# Patient Record
Sex: Female | Born: 1960
Health system: Southern US, Community
[De-identification: ages and names within clinical notes are randomized; demographics above are authoritative.]

## PROBLEM LIST (undated history)

## (undated) DIAGNOSIS — N3946 Mixed incontinence: Secondary | ICD-10-CM

## (undated) DIAGNOSIS — G47419 Narcolepsy without cataplexy: Secondary | ICD-10-CM

## (undated) DIAGNOSIS — J9611 Chronic respiratory failure with hypoxia: Secondary | ICD-10-CM

## (undated) DIAGNOSIS — M545 Low back pain, unspecified: Secondary | ICD-10-CM

## (undated) DIAGNOSIS — I10 Essential (primary) hypertension: Secondary | ICD-10-CM

## (undated) DIAGNOSIS — J449 Chronic obstructive pulmonary disease, unspecified: Secondary | ICD-10-CM

## (undated) DIAGNOSIS — K0530 Chronic periodontitis, unspecified: Secondary | ICD-10-CM

## (undated) DIAGNOSIS — E669 Obesity, unspecified: Secondary | ICD-10-CM

## (undated) DIAGNOSIS — N139 Obstructive and reflux uropathy, unspecified: Secondary | ICD-10-CM

## (undated) DIAGNOSIS — M419 Scoliosis, unspecified: Secondary | ICD-10-CM

## (undated) DIAGNOSIS — Z87898 Personal history of other specified conditions: Secondary | ICD-10-CM

## (undated) DIAGNOSIS — R011 Cardiac murmur, unspecified: Secondary | ICD-10-CM

## (undated) DIAGNOSIS — M51379 Other intervertebral disc degeneration, lumbosacral region without mention of lumbar back pain or lower extremity pain: Secondary | ICD-10-CM

## (undated) DIAGNOSIS — Z87440 Personal history of urinary (tract) infections: Secondary | ICD-10-CM

## (undated) DIAGNOSIS — G8929 Other chronic pain: Secondary | ICD-10-CM

## (undated) DIAGNOSIS — K219 Gastro-esophageal reflux disease without esophagitis: Secondary | ICD-10-CM

## (undated) DIAGNOSIS — J410 Simple chronic bronchitis: Secondary | ICD-10-CM

## (undated) DIAGNOSIS — R0902 Hypoxemia: Secondary | ICD-10-CM

## (undated) DIAGNOSIS — C799 Secondary malignant neoplasm of unspecified site: Secondary | ICD-10-CM

## (undated) DIAGNOSIS — M5137 Other intervertebral disc degeneration, lumbosacral region: Secondary | ICD-10-CM

## (undated) DIAGNOSIS — F329 Major depressive disorder, single episode, unspecified: Secondary | ICD-10-CM

## (undated) DIAGNOSIS — K029 Dental caries, unspecified: Secondary | ICD-10-CM

## (undated) DIAGNOSIS — G4733 Obstructive sleep apnea (adult) (pediatric): Secondary | ICD-10-CM

## (undated) DIAGNOSIS — Z85828 Personal history of other malignant neoplasm of skin: Secondary | ICD-10-CM

## (undated) DIAGNOSIS — G4730 Sleep apnea, unspecified: Secondary | ICD-10-CM

## (undated) DIAGNOSIS — D689 Coagulation defect, unspecified: Secondary | ICD-10-CM

## (undated) DIAGNOSIS — M797 Fibromyalgia: Secondary | ICD-10-CM

## (undated) DIAGNOSIS — D471 Chronic myeloproliferative disease: Secondary | ICD-10-CM

## (undated) DIAGNOSIS — R918 Other nonspecific abnormal finding of lung field: Secondary | ICD-10-CM

## (undated) DIAGNOSIS — Z973 Presence of spectacles and contact lenses: Secondary | ICD-10-CM

## (undated) DIAGNOSIS — K0889 Other specified disorders of teeth and supporting structures: Secondary | ICD-10-CM

## (undated) DIAGNOSIS — G629 Polyneuropathy, unspecified: Secondary | ICD-10-CM

## (undated) DIAGNOSIS — Z9889 Other specified postprocedural states: Secondary | ICD-10-CM

## (undated) DIAGNOSIS — D45 Polycythemia vera: Secondary | ICD-10-CM

## (undated) DIAGNOSIS — Z9981 Dependence on supplemental oxygen: Secondary | ICD-10-CM

## (undated) DIAGNOSIS — K635 Polyp of colon: Secondary | ICD-10-CM

## (undated) DIAGNOSIS — F419 Anxiety disorder, unspecified: Secondary | ICD-10-CM

## (undated) DIAGNOSIS — F410 Panic disorder [episodic paroxysmal anxiety] without agoraphobia: Secondary | ICD-10-CM

## (undated) DIAGNOSIS — M1990 Unspecified osteoarthritis, unspecified site: Secondary | ICD-10-CM

## (undated) DIAGNOSIS — F4000 Agoraphobia, unspecified: Secondary | ICD-10-CM

## (undated) DIAGNOSIS — E119 Type 2 diabetes mellitus without complications: Secondary | ICD-10-CM

## (undated) DIAGNOSIS — E785 Hyperlipidemia, unspecified: Secondary | ICD-10-CM

## (undated) HISTORY — DX: Polyneuropathy, unspecified: G62.9

## (undated) HISTORY — DX: Anxiety disorder, unspecified: F41.9

## (undated) HISTORY — DX: Obesity, unspecified: E66.9

## (undated) HISTORY — DX: Cardiac murmur, unspecified: R01.1

## (undated) HISTORY — DX: Scoliosis, unspecified: M41.9

## (undated) HISTORY — DX: Agoraphobia, unspecified: F40.00

## (undated) HISTORY — DX: Polycythemia vera: D45

## (undated) HISTORY — DX: Major depressive disorder, single episode, unspecified: F32.9

## (undated) HISTORY — DX: Fibromyalgia: M79.7

## (undated) HISTORY — DX: Polyp of colon: K63.5

## (undated) HISTORY — DX: Obstructive and reflux uropathy, unspecified: N13.9

## (undated) HISTORY — DX: Coagulation defect, unspecified: D68.9

## (undated) HISTORY — DX: Personal history of urinary (tract) infections: Z87.440

## (undated) HISTORY — DX: Hypoxemia: R09.02

## (undated) HISTORY — PX: DILATION AND CURETTAGE OF UTERUS: SHX78

## (undated) HISTORY — DX: Narcolepsy without cataplexy: G47.419

## (undated) HISTORY — DX: Essential (primary) hypertension: I10

## (undated) HISTORY — PX: COLONOSCOPY W/ BIOPSIES AND POLYPECTOMY: SHX1376

## (undated) HISTORY — DX: Secondary malignant neoplasm of unspecified site: C79.9

## (undated) HISTORY — DX: Hyperlipidemia, unspecified: E78.5

## (undated) HISTORY — DX: Panic disorder (episodic paroxysmal anxiety): F41.0

---

## 1993-06-29 HISTORY — PX: BREAST LUMPECTOMY: SHX2

## 1996-06-29 HISTORY — PX: ABDOMINAL HYSTERECTOMY: SHX81

## 1998-06-29 HISTORY — PX: CYSTECTOMY: SUR359

## 2011-06-30 HISTORY — PX: MOHS SURGERY: SUR867

## 2011-12-10 ENCOUNTER — Other Ambulatory Visit: Payer: Self-pay | Admitting: Family Medicine

## 2011-12-10 DIAGNOSIS — Z1231 Encounter for screening mammogram for malignant neoplasm of breast: Secondary | ICD-10-CM

## 2011-12-10 DIAGNOSIS — Z780 Asymptomatic menopausal state: Secondary | ICD-10-CM

## 2011-12-30 ENCOUNTER — Ambulatory Visit: Payer: Self-pay

## 2011-12-30 ENCOUNTER — Other Ambulatory Visit: Payer: Self-pay

## 2012-02-16 ENCOUNTER — Ambulatory Visit (INDEPENDENT_AMBULATORY_CARE_PROVIDER_SITE_OTHER): Payer: Medicare PPO | Admitting: Orthopedic Surgery

## 2012-02-16 ENCOUNTER — Encounter: Payer: Self-pay | Admitting: Orthopedic Surgery

## 2012-02-16 VITALS — BP 140/82 | Ht 68.0 in | Wt 246.0 lb

## 2012-02-16 DIAGNOSIS — M5137 Other intervertebral disc degeneration, lumbosacral region: Secondary | ICD-10-CM

## 2012-02-16 DIAGNOSIS — F3289 Other specified depressive episodes: Secondary | ICD-10-CM

## 2012-02-16 DIAGNOSIS — M797 Fibromyalgia: Secondary | ICD-10-CM | POA: Insufficient documentation

## 2012-02-16 DIAGNOSIS — IMO0001 Reserved for inherently not codable concepts without codable children: Secondary | ICD-10-CM

## 2012-02-16 DIAGNOSIS — G894 Chronic pain syndrome: Secondary | ICD-10-CM

## 2012-02-16 DIAGNOSIS — F329 Major depressive disorder, single episode, unspecified: Secondary | ICD-10-CM

## 2012-02-16 DIAGNOSIS — M5030 Other cervical disc degeneration, unspecified cervical region: Secondary | ICD-10-CM

## 2012-02-16 DIAGNOSIS — M5136 Other intervertebral disc degeneration, lumbar region: Secondary | ICD-10-CM | POA: Insufficient documentation

## 2012-02-16 DIAGNOSIS — M51379 Other intervertebral disc degeneration, lumbosacral region without mention of lumbar back pain or lower extremity pain: Secondary | ICD-10-CM

## 2012-02-16 DIAGNOSIS — F32A Depression, unspecified: Secondary | ICD-10-CM | POA: Insufficient documentation

## 2012-02-16 NOTE — Progress Notes (Signed)
Subjective:    Katelyn Cline is a 51 y.o. female referred to me from [facility] Dr. Maryelizabeth Rowan. The patient has a chief complaint of neck back and right-sided hip pain which she's had for 15-35 years starting with her back 35 years ago secondary to scoliosis. Her neck and hip have been hurting her for the last 15 years. She moved from [place] where she was receiving treatment for fibromyalgia with physical therapy and she was on Lortab and Flexeril at that time. She has not had Lortab for the last 6 months.  She is currently disabled secondary to various medical problems including depression, chronic pain. There she is currently complaining of sharp dull stabbing pain which she rates between 8 and 10 previously relieved by Lortab down to a 2 or 3. Her hip pain is constant her back pain is intermittent. The neck and mid thoracic pain started in the morning, the lower back spasms occur intermittently. Her right hip pain is in her right gluteal area. Her pain is improved by reclining and with the medication. She has difficulty with sleeping with increased pain as well as increased pain standing walking and driving. She has occasional numbness and tingling into the right lower extremity.  Specifically her neck is pain full in the posterior region associated with stiffness and difficulty turning. She has increased pain with driving. She feels crepitation when rotating her head and neck. She has difficulty holding her head up this is relieved by extension. She will have occasional numbness and tingling in her hands and arms sometimes not at all but sometimes up to 2-3 times a week and she reports weakness in her upper extremities  Specifically her back pain is in 2 different areas primarily in the lower part which is worse with standing. She cannot stand long enough to make a sandwich or do any real activities of daily living. She has difficulty even taking a bath or shower. She will have  spasms excuse her pain up to a 10 otherwise she has pain 2-3/10.  Specific to her right hip which is her right buttock area the pain were run down to the knee and occasionally below. But she is favoring the right side she will have pain across the left side of her gluteal area. This is recently required the use of a cane.    She has weakness in the right leg, tingling in the right leg, burning pain in the right leg and Denies difficulty urinating painful urinating fine pain or hematuria associated with the back pain. The patient has no "red flag" history indicative of complicated back pain.  The following portions of the patient's history were reviewed and updated as appropriate: allergies, current medications, past family history, past medical history, past social history, past surgical history and problem list.  Review of Systems Positive findings include fatigue, blurred vision and watering of the eyes. Chest pain and chest palpitations. Snoring. Heartburn nausea constipation diarrhea. Frequency urgency joint pain. Stiffness. Muscle pain. Poor healing and itching in the skin. Numbness tingling unsteady gait and dizziness. Nervousness, anxiety, depression. Easy bleeding. Temperature intolerance. Denies adverse food reaction or seasonal allergies. Denies fever chills weight loss.    Objective:     BP 140/82  Ht 5\' 8"  (1.727 m)  Wt 246 lb (111.585 kg)  BMI 37.40 kg/m2 The patient required and requested that her Rottweiler dog be with her for her exam General appearance: The patient's overall appearance was within normal limits. Her body  habitus is mesomorphic.  She was oriented x3. Her mood and affect were normal. She ambulated with a cane and had normal standing posture.  Cervical spine flexion and extension were normal she had better rotation to the right than the left. She had tenderness at C6 and C7 as well as C5 C4 interspace. No atrophy. Skin normal. No instability. No  lymphadenopathy  Right lower extremity flexion normal no tenderness no instability normal strength normal skin, no lymphadenopathy. No detectable injury loss. Coordination and balance normal  Left lower extremity flexion was normal, no instability no tenderness normal strength normal skin Coordination and balance normal no lymphadenopathy Internal and external rotation and hip flexion causes pain in the patient's lower back without radicular symptoms.  Deep tendon reflexes at the knee ankle elbow and wrist were normal  Distally both lower extremities and upper extremities and normal pulse and temperature without swelling.   Imaging her son and this concluded 5 views of the cervical spine and 3 views of the lumbar spine. She has L5-S1 disc space narrowing. She is coronal plane malalignment in the lumbar spine.  The cervical spine has disc space disease at C4 and 5 with loss of normal cervical lordosis.  Assessment:    Our conclusions indicate the following the patient has osteoarthritis of the cervical spine at C4 to 5. She also has degenerative disc disease at L5 and S1. There is fibromyalgia syndrome. Chronic pain.    Plan:    Our recommendations are for a physical therapy program to include swimming. She will need chronic pain management for combination therapy which should include but not be limited to anti-inflammatories, analgesics, antidepressants.     Unfortunately, I cannot take this patient on as an ongoing patient. She has chronic pain in knees chronic pain management. No surgery is needed. He have any other treatments to offer her and this is strictly a second opinion  Regarding the patient's disability. Individually her lumbar arthritis and her cervical arthritis do not cause any orthopedic disability however in the setting of depression, fibromyalgia, these entities can certainly be focused on as a cause of dysfunction. I cannot comment on the patient's overall disability  which seemed to be more related to her psychological issues.

## 2012-03-14 ENCOUNTER — Encounter (HOSPITAL_COMMUNITY): Payer: Self-pay | Admitting: Psychiatry

## 2012-03-14 ENCOUNTER — Ambulatory Visit (INDEPENDENT_AMBULATORY_CARE_PROVIDER_SITE_OTHER): Payer: Medicare PPO | Admitting: Psychiatry

## 2012-03-14 ENCOUNTER — Other Ambulatory Visit (HOSPITAL_COMMUNITY): Payer: Self-pay | Admitting: *Deleted

## 2012-03-14 VITALS — BP 127/83 | HR 71 | Wt 247.8 lb

## 2012-03-14 DIAGNOSIS — F0630 Mood disorder due to known physiological condition, unspecified: Secondary | ICD-10-CM

## 2012-03-14 DIAGNOSIS — F3289 Other specified depressive episodes: Secondary | ICD-10-CM

## 2012-03-14 DIAGNOSIS — F329 Major depressive disorder, single episode, unspecified: Secondary | ICD-10-CM

## 2012-03-14 MED ORDER — BUPROPION HCL ER (SR) 100 MG PO TB12: 100.0000 mg | ORAL_TABLET | Freq: Every morning | ORAL | Status: DC
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
## 2012-03-30 ENCOUNTER — Encounter (HOSPITAL_COMMUNITY): Payer: Self-pay | Admitting: Psychiatry

## 2012-03-30 ENCOUNTER — Ambulatory Visit (INDEPENDENT_AMBULATORY_CARE_PROVIDER_SITE_OTHER): Payer: Medicare PPO | Admitting: Psychiatry

## 2012-03-30 DIAGNOSIS — F329 Major depressive disorder, single episode, unspecified: Secondary | ICD-10-CM

## 2012-03-30 DIAGNOSIS — F3289 Other specified depressive episodes: Secondary | ICD-10-CM

## 2012-03-30 DIAGNOSIS — F39 Unspecified mood [affective] disorder: Secondary | ICD-10-CM

## 2012-03-30 MED ORDER — BUPROPION HCL ER (SR) 150 MG PO TB12: 150.0000 mg | ORAL_TABLET | Freq: Every morning | ORAL | Status: DC
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
## 2012-04-06 ENCOUNTER — Ambulatory Visit (HOSPITAL_COMMUNITY): Payer: Medicare PPO | Admitting: Licensed Clinical Social Worker

## 2012-04-08 ENCOUNTER — Encounter: Payer: Self-pay | Admitting: Family Medicine

## 2012-04-08 ENCOUNTER — Ambulatory Visit (INDEPENDENT_AMBULATORY_CARE_PROVIDER_SITE_OTHER): Payer: Medicare PPO | Admitting: Family Medicine

## 2012-04-08 VITALS — BP 152/84 | HR 64 | Resp 18 | Ht 67.5 in | Wt 250.0 lb

## 2012-04-08 DIAGNOSIS — M51369 Other intervertebral disc degeneration, lumbar region without mention of lumbar back pain or lower extremity pain: Secondary | ICD-10-CM

## 2012-04-08 DIAGNOSIS — G609 Hereditary and idiopathic neuropathy, unspecified: Secondary | ICD-10-CM

## 2012-04-08 DIAGNOSIS — E785 Hyperlipidemia, unspecified: Secondary | ICD-10-CM

## 2012-04-08 DIAGNOSIS — M5136 Other intervertebral disc degeneration, lumbar region: Secondary | ICD-10-CM

## 2012-04-08 DIAGNOSIS — G629 Polyneuropathy, unspecified: Secondary | ICD-10-CM

## 2012-04-08 DIAGNOSIS — F329 Major depressive disorder, single episode, unspecified: Secondary | ICD-10-CM

## 2012-04-08 DIAGNOSIS — G47419 Narcolepsy without cataplexy: Secondary | ICD-10-CM

## 2012-04-08 DIAGNOSIS — F17200 Nicotine dependence, unspecified, uncomplicated: Secondary | ICD-10-CM

## 2012-04-08 DIAGNOSIS — E669 Obesity, unspecified: Secondary | ICD-10-CM

## 2012-04-08 DIAGNOSIS — J3489 Other specified disorders of nose and nasal sinuses: Secondary | ICD-10-CM

## 2012-04-08 DIAGNOSIS — L989 Disorder of the skin and subcutaneous tissue, unspecified: Secondary | ICD-10-CM

## 2012-04-08 DIAGNOSIS — M797 Fibromyalgia: Secondary | ICD-10-CM

## 2012-04-08 DIAGNOSIS — G894 Chronic pain syndrome: Secondary | ICD-10-CM

## 2012-04-08 DIAGNOSIS — M5137 Other intervertebral disc degeneration, lumbosacral region: Secondary | ICD-10-CM

## 2012-04-08 DIAGNOSIS — Z720 Tobacco use: Secondary | ICD-10-CM

## 2012-04-08 DIAGNOSIS — F3289 Other specified depressive episodes: Secondary | ICD-10-CM

## 2012-04-08 DIAGNOSIS — IMO0001 Reserved for inherently not codable concepts without codable children: Secondary | ICD-10-CM

## 2012-04-08 DIAGNOSIS — F32A Depression, unspecified: Secondary | ICD-10-CM

## 2012-04-08 DIAGNOSIS — I10 Essential (primary) hypertension: Secondary | ICD-10-CM

## 2012-04-08 MED ORDER — TRAMADOL HCL 50 MG PO TABS: 50.0000 mg | ORAL_TABLET | Freq: Four times a day (QID) | ORAL | Status: DC | PRN

## 2012-04-08 NOTE — Patient Instructions (Addendum)
I will records from Dr. Duanne Guess and Dr. Jory Ee  Use the arthritis medication Tramadol  I recommend you quit smoking! Referral to Dermatology  F/U 2 months - morning appointment, come fasting

## 2012-04-09 ENCOUNTER — Encounter: Payer: Self-pay | Admitting: Family Medicine

## 2012-04-09 DIAGNOSIS — G47419 Narcolepsy without cataplexy: Secondary | ICD-10-CM | POA: Insufficient documentation

## 2012-04-09 DIAGNOSIS — E669 Obesity, unspecified: Secondary | ICD-10-CM | POA: Insufficient documentation

## 2012-04-09 DIAGNOSIS — I10 Essential (primary) hypertension: Secondary | ICD-10-CM | POA: Insufficient documentation

## 2012-04-09 DIAGNOSIS — F17200 Nicotine dependence, unspecified, uncomplicated: Secondary | ICD-10-CM | POA: Insufficient documentation

## 2012-04-09 DIAGNOSIS — G629 Polyneuropathy, unspecified: Secondary | ICD-10-CM | POA: Insufficient documentation

## 2012-04-09 DIAGNOSIS — E785 Hyperlipidemia, unspecified: Secondary | ICD-10-CM | POA: Insufficient documentation

## 2012-04-09 DIAGNOSIS — L989 Disorder of the skin and subcutaneous tissue, unspecified: Secondary | ICD-10-CM | POA: Insufficient documentation

## 2012-04-09 NOTE — Assessment & Plan Note (Signed)
Per above, ultram

## 2012-04-09 NOTE — Assessment & Plan Note (Signed)
reviewed ortho note, start ultram, I do not plan to start narcotics on pt

## 2012-04-09 NOTE — Assessment & Plan Note (Signed)
provigil

## 2012-04-09 NOTE — Assessment & Plan Note (Signed)
No current medications

## 2012-04-09 NOTE — Progress Notes (Signed)
  Subjective:    Patient ID: Katelyn Cline, female    DOB: May 23, 1961, 51 y.o.   MRN: [id number]  HPI Pt here to establish care previous PCP Dr. Maryelizabeth Rowan, prior to Practice in [place] Psychiatry- Dr. Lolly Mustache Medications and history reviewed She has extensive mental illness with depression, anxiety and panic attacks, she is being followed by psychiatry. Her disability status is due to her mental illness and she has a service dog because of this. She also suffers with Chronic pain / fibromyalgia in hips, legs, neck, shoulders- she was previously on lortab for 15 years but when she moved to [place] this was not continued. She was evaluated by orthopedics whose recommendations were that her pain was out of proportion to her bone and joint diease and that her mental health was contributing. She lives here with her son. She has a lesion on her nose for the past few years that has been growing in size and eroding into her skin, has not seen dermatology Due for Mammogram Previous colonoscopy- she has seen [facility]   Review of Systems - per above   GEN- + fatigue, fever, weight loss,weakness, recent illness HEENT- denies eye drainage, change in vision, nasal discharge, CVS- denies chest pain, palpitations RESP- denies SOB, cough, wheeze ABD- denies N/V, change in stools, abd pain GU- denies dysuria, hematuria, dribbling, incontinence MSK- + joint pain,+ muscle aches, injury Neuro- denies headache, dizziness, syncope, seizure activity      Objective:   Physical Exam GEN- NAD, alert and oriented x3, walks with cane HEENT- PERRL, EOMI, non injected sclera, pink conjunctiva, MMM, oropharynx clear Neck- Supple,  CVS- RRR, no murmur RESP-CTAB EXT- pedal edema, feet very dirty, no open lesions Pulses- Radial, DP- 2+ Skin- scabbed lesion with mild erythema eroding into skin on left side nares, no bleeding noted        Assessment & Plan:

## 2012-04-09 NOTE — Assessment & Plan Note (Signed)
Continue cymbalta, she has some coverage problems with her insurance, we may have to try gabapentin as she has neuropathy as well

## 2012-04-09 NOTE — Assessment & Plan Note (Signed)
Continue lipitor and fish oil

## 2012-04-09 NOTE — Assessment & Plan Note (Signed)
Refer to dermatology 

## 2012-04-09 NOTE — Assessment & Plan Note (Signed)
Followed by psychiatry 

## 2012-04-14 ENCOUNTER — Telehealth: Payer: Self-pay | Admitting: Family Medicine

## 2012-04-14 ENCOUNTER — Other Ambulatory Visit (HOSPITAL_COMMUNITY): Payer: Self-pay | Admitting: Psychiatry

## 2012-04-14 NOTE — Telephone Encounter (Signed)
Please advise 

## 2012-04-14 NOTE — Telephone Encounter (Signed)
She can try 1 -2 tablet, I will not prescribe any further meds until records are received

## 2012-04-19 NOTE — Telephone Encounter (Signed)
Patient aware.

## 2012-04-25 ENCOUNTER — Ambulatory Visit (INDEPENDENT_AMBULATORY_CARE_PROVIDER_SITE_OTHER): Payer: Medicare PPO | Admitting: Psychiatry

## 2012-05-04 ENCOUNTER — Telehealth: Payer: Self-pay

## 2012-05-04 ENCOUNTER — Ambulatory Visit (INDEPENDENT_AMBULATORY_CARE_PROVIDER_SITE_OTHER): Payer: Medicare PPO | Admitting: Psychology

## 2012-05-04 ENCOUNTER — Other Ambulatory Visit: Payer: Self-pay

## 2012-05-04 ENCOUNTER — Encounter (HOSPITAL_COMMUNITY): Payer: Self-pay | Admitting: Psychology

## 2012-05-04 ENCOUNTER — Other Ambulatory Visit (HOSPITAL_COMMUNITY): Payer: Self-pay | Admitting: Psychiatry

## 2012-05-04 DIAGNOSIS — F3289 Other specified depressive episodes: Secondary | ICD-10-CM

## 2012-05-04 DIAGNOSIS — F329 Major depressive disorder, single episode, unspecified: Secondary | ICD-10-CM

## 2012-05-04 MED ORDER — IBUPROFEN 800 MG PO TABS: 800.0000 mg | ORAL_TABLET | Freq: Three times a day (TID) | ORAL | Status: DC | PRN

## 2012-05-04 NOTE — Telephone Encounter (Signed)
Med ordered

## 2012-05-04 NOTE — Telephone Encounter (Signed)
YOu can send ibuprofen 800mg , 1 po tID, #60  R 2. She was given ultram with 1 refill, she can take 1-2 tablets
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
## 2012-05-05 ENCOUNTER — Other Ambulatory Visit (HOSPITAL_COMMUNITY): Payer: Self-pay | Admitting: Psychiatry

## 2012-05-05 DIAGNOSIS — F329 Major depressive disorder, single episode, unspecified: Secondary | ICD-10-CM

## 2012-05-05 DIAGNOSIS — F3289 Other specified depressive episodes: Secondary | ICD-10-CM

## 2012-05-05 MED ORDER — BUPROPION HCL ER (SR) 150 MG PO TB12: 150.0000 mg | ORAL_TABLET | Freq: Every morning | ORAL | Status: DC

## 2012-05-13 ENCOUNTER — Telehealth: Payer: Self-pay | Admitting: Family Medicine

## 2012-05-13 NOTE — Telephone Encounter (Signed)
This is filled by her psychiatrist.

## 2012-05-13 NOTE — Telephone Encounter (Signed)
Is this med prescribed by you?

## 2012-05-17 NOTE — Telephone Encounter (Signed)
Patient aware.

## 2012-05-18 ENCOUNTER — Ambulatory Visit (INDEPENDENT_AMBULATORY_CARE_PROVIDER_SITE_OTHER): Payer: Medicare PPO | Admitting: Psychology

## 2012-05-18 DIAGNOSIS — F329 Major depressive disorder, single episode, unspecified: Secondary | ICD-10-CM

## 2012-05-18 DIAGNOSIS — F3289 Other specified depressive episodes: Secondary | ICD-10-CM

## 2012-05-19 ENCOUNTER — Encounter (HOSPITAL_COMMUNITY): Payer: Self-pay | Admitting: Psychiatry

## 2012-05-19 ENCOUNTER — Ambulatory Visit (INDEPENDENT_AMBULATORY_CARE_PROVIDER_SITE_OTHER): Payer: Medicare PPO | Admitting: Psychiatry

## 2012-05-19 VITALS — Ht 67.75 in | Wt 249.8 lb

## 2012-05-19 DIAGNOSIS — Z720 Tobacco use: Secondary | ICD-10-CM

## 2012-05-19 DIAGNOSIS — E785 Hyperlipidemia, unspecified: Secondary | ICD-10-CM

## 2012-05-19 DIAGNOSIS — F0630 Mood disorder due to known physiological condition, unspecified: Secondary | ICD-10-CM

## 2012-05-19 DIAGNOSIS — M797 Fibromyalgia: Secondary | ICD-10-CM

## 2012-05-19 DIAGNOSIS — F329 Major depressive disorder, single episode, unspecified: Secondary | ICD-10-CM

## 2012-05-19 DIAGNOSIS — F3289 Other specified depressive episodes: Secondary | ICD-10-CM

## 2012-05-19 MED ORDER — DULOXETINE HCL 60 MG PO CPEP: 60.0000 mg | ORAL_CAPSULE | Freq: Every day | ORAL | Status: DC

## 2012-05-19 MED ORDER — BUPROPION HCL ER (SR) 150 MG PO TB12
150.0000 mg | ORAL_TABLET | Freq: Every morning | ORAL | Status: DC
Start: 2012-05-19 — End: 2012-08-15

## 2012-05-19 MED ORDER — AMITRIPTYLINE HCL 25 MG PO TABS: 25.0000 mg | ORAL_TABLET | Freq: Every day | ORAL | Status: DC

## 2012-05-19 MED ORDER — GABAPENTIN 100 MG PO CAPS: ORAL_CAPSULE | ORAL | Status: DC
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
## 2012-05-19 NOTE — Patient Instructions (Signed)
Fibromyalgia can be controled well with a combination of Cymbalta with Neurontin with Elavil.  Elavil at night may help with getting to sleep, pain, anxiety, and depression.  It also amplifies the effect of antiinflammatories.

## 2012-05-20 ENCOUNTER — Encounter (HOSPITAL_COMMUNITY): Payer: Self-pay | Admitting: Psychology

## 2012-05-20 NOTE — Progress Notes (Signed)
Patient:  Katelyn Cline   DOB: July 26, 1960  MR Number: [id number]  Location: BEHAVIORAL [facility] [address] [place] [address] Dept: [phone]  Start: 1 PM End: 2 PM  Provider/Observer:     Hershal Coria PSYD  Chief Complaint:      Chief Complaint  Patient presents with  . Depression    Reason For Service:     The patient was referred by Dr. Lolly Mustache for psychotherapeutic interventions. Her background history can be found in both his previous notes as well as her primary care physician's notes. The patient reports that she has numerous orthopedic problems including severe spinal arthritis and degenerative disc issues as well as fibromyalgia and orthopedic issues with her niece just to name a few. The patient has a lifelong history of depression but over the past 5 years she has been experiencing more panic attacks and anxiety. The patient reports that she started these anxiety and panic symptoms do to her last job which was extremely stressful and she feels like some of her coworkers at that time were doing things to try to cause her great distress. She is on Social Security disability do to depression and anxiety night and counting orthopedic issues. The patient reports that when she was at her last job she did experience some passive suicidal ideation but never developed specific impulses or any gestures. She denies suicidal ideation now. The patient denies any family history of bipolar disorder but does have a mother who suffered from depression. She is coming here to see me for psychotherapeutic interventions to build coping skills and strategies for her recurrent issues of depression and pain management issues.   Interventions Strategy:  Cognitive/behavioral psychotherapeutic interventions  Participation Level:   Active  Participation Quality:  Appropriate      Behavioral Observation:  Well  Groomed, Alert, and Appropriate.   Current Psychosocial Factors: The patient reports that she is continuing to have significant pain and orthopedic issues and her depression continues to be problematic for her.  Content of Session:   Reviewed current symptoms and continued work on therapeutic interventions for recurrent symptoms of depression, chronic pain issues, and history of anxiety and panic.  Current Status:   The patient reports her symptoms have been better and that she's been actively working on therapeutic interventions we have develop.  Patient Progress:   Good  Target Goals:   Target goals include reducing the intensity, frequency, and duration of depression symptoms including feelings of helplessness and hopelessness, anhedonia, and social isolation. Significant pain issues, fibromyalgia type symptoms, and anxiety and panic are all present:.  Last Reviewed:   05/18/2012  Goals Addressed Today:    Today we worked on the cognitive and behavioral types of interventions for issues of depression and address issues of her cognitive interpretation of her situation.  Impression/Diagnosis:   The patient has a long history of depression going back most of her life but more recently she has developed significant panic attacks and anxiety symptoms that started approximately 5 years ago. She has numerous orthopedic and medical issues and is not in good physical health at all. Degenerative disc and possible nerve root impingement issues in both her neck and back are present as well as significant orthopedic issues with her legs. She has been diagnosed with fibromyalgia and other pain symptoms as well.   Diagnosis:    Axis I:  1. Depressive disorder, not elsewhere classified  Axis II: No diagnosis

## 2012-06-06 ENCOUNTER — Ambulatory Visit (HOSPITAL_COMMUNITY): Payer: Self-pay | Admitting: Psychology

## 2012-06-09 ENCOUNTER — Other Ambulatory Visit: Payer: Self-pay | Admitting: Family Medicine

## 2012-06-09 ENCOUNTER — Ambulatory Visit (INDEPENDENT_AMBULATORY_CARE_PROVIDER_SITE_OTHER): Payer: Medicare PPO | Admitting: Family Medicine

## 2012-06-09 ENCOUNTER — Encounter: Payer: Self-pay | Admitting: Family Medicine

## 2012-06-09 VITALS — BP 158/92 | HR 86 | Resp 16 | Ht 67.5 in | Wt 254.0 lb

## 2012-06-09 DIAGNOSIS — G894 Chronic pain syndrome: Secondary | ICD-10-CM

## 2012-06-09 DIAGNOSIS — D582 Other hemoglobinopathies: Secondary | ICD-10-CM

## 2012-06-09 DIAGNOSIS — I10 Essential (primary) hypertension: Secondary | ICD-10-CM

## 2012-06-09 DIAGNOSIS — E669 Obesity, unspecified: Secondary | ICD-10-CM

## 2012-06-09 DIAGNOSIS — E785 Hyperlipidemia, unspecified: Secondary | ICD-10-CM

## 2012-06-09 LAB — LIPID PANEL: HDL: 34 mg/dL — ABNORMAL LOW (ref >39)

## 2012-06-09 LAB — COMPREHENSIVE METABOLIC PANEL
Albumin: 4.3 g/dL (ref 3.5–5.2)
CO2: 20 mEq/L (ref 19–32)
Glucose, Bld: 130 mg/dL — ABNORMAL HIGH (ref 70–99)
Potassium: 5.2 mEq/L (ref 3.5–5.3)
Sodium: 140 mEq/L (ref 135–145)
Total Protein: 7.3 g/dL (ref 6.0–8.3)

## 2012-06-09 LAB — CBC
Platelets: 268 10*3/uL (ref 150–400)
RBC: 5.75 MIL/uL — ABNORMAL HIGH (ref 3.87–5.11)
WBC: 12.6 10*3/uL — ABNORMAL HIGH (ref 4.0–10.5)

## 2012-06-09 LAB — TSH: TSH: 3.474 u[IU]/mL (ref 0.350–4.500)

## 2012-06-09 MED ORDER — AMLODIPINE BESYLATE 5 MG PO TABS: 5.0000 mg | ORAL_TABLET | Freq: Every day | ORAL | Status: DC

## 2012-06-09 NOTE — Assessment & Plan Note (Signed)
Check FLP 

## 2012-06-09 NOTE — Progress Notes (Signed)
  Subjective:    Patient ID: Katelyn Cline, female    DOB: 1961-06-04, 51 y.o.   MRN: [id number]  HPI Pt here to f/u HTN, due for fasting labs Continues to have chronic pain, ultram discontinued, she was started on neurontin by her psychiatrist and elavil at bedtime, this has been helping some but wears off. She is asking for muscle relaxant Due to have MOHS procedure Monday for SCC of nose/face HTN- blood pressure has been running high, she remembers being on more than 1 medication   Review of Systems GEN- denies fatigue, fever, weight loss,weakness, recent illness HEENT- denies eye drainage, change in vision, nasal discharge, CVS- denies chest pain, palpitations RESP- denies SOB, cough, wheeze ABD- denies N/V, change in stools, abd pain GU- denies dysuria, hematuria, dribbling, incontinence MSK- + joint pain, muscle aches, injury Neuro- denies headache, dizziness, syncope, seizure activity        Objective:   Physical Exam  GEN- NAD, alert and oriented x3, walks with cane HEENT- PERRL, EOMI, non injected sclera, pink conjunctiva, MMM, oropharynx clear Neck- Supple,  CVS- RRR, no murmur RESP-CTAB EXT- pedal edema,  Pulses- Radial, DP- 2+ Psych- depressed affect, not overly anxious     Assessment & Plan:

## 2012-06-09 NOTE — Assessment & Plan Note (Signed)
unchanged

## 2012-06-09 NOTE — Assessment & Plan Note (Signed)
Discussed my concerns about adding narcotic to her current meds,  In setting of her mental health/memory problems overall function Continue ibuprofen change neurontin to 300mg  BID Will not add muscle relaxant as she is on elavil as well had narcolepsy and at risk for increased somnolence

## 2012-06-09 NOTE — Patient Instructions (Addendum)
Norvasc at bedtime for blood pressure HCTZ in the morning  For the neurontin take 300mg  in morning and 300mg  in evening  We will call with results of the labs  Continue ibuprofen We will check to see if Provigil discount  F/U 8 weeks

## 2012-06-09 NOTE — Assessment & Plan Note (Signed)
I was unaware she had prescription dyazide before, BP uncontrolled, add norvasc at bedtime

## 2012-06-10 ENCOUNTER — Other Ambulatory Visit: Payer: Self-pay

## 2012-06-10 MED ORDER — MODAFINIL 200 MG PO TABS: ORAL_TABLET | ORAL | Status: DC

## 2012-06-10 MED ORDER — TRIAMTERENE-HCTZ 37.5-25 MG PO CAPS: ORAL_CAPSULE | ORAL | Status: DC

## 2012-06-17 NOTE — Addendum Note (Signed)
Addended by: Kandis Fantasia B on: 06/17/2012 11:41 AM   Modules accepted: Orders

## 2012-07-11 ENCOUNTER — Telehealth (HOSPITAL_COMMUNITY): Payer: Self-pay | Admitting: Psychiatry

## 2012-07-11 DIAGNOSIS — F3289 Other specified depressive episodes: Secondary | ICD-10-CM

## 2012-07-11 DIAGNOSIS — F329 Major depressive disorder, single episode, unspecified: Secondary | ICD-10-CM

## 2012-07-11 DIAGNOSIS — M797 Fibromyalgia: Secondary | ICD-10-CM

## 2012-07-11 MED ORDER — GABAPENTIN 100 MG PO CAPS: ORAL_CAPSULE | ORAL | Status: DC

## 2012-07-11 NOTE — Telephone Encounter (Signed)
Faxed request approved via eScripts. 

## 2012-07-19 ENCOUNTER — Ambulatory Visit (HOSPITAL_COMMUNITY): Payer: Self-pay | Admitting: Psychiatry

## 2012-08-04 ENCOUNTER — Encounter: Payer: Self-pay | Admitting: Family Medicine

## 2012-08-04 ENCOUNTER — Ambulatory Visit (INDEPENDENT_AMBULATORY_CARE_PROVIDER_SITE_OTHER): Payer: Medicare PPO | Admitting: Family Medicine

## 2012-08-04 VITALS — BP 130/82 | HR 85 | Resp 16 | Ht 67.5 in | Wt 267.0 lb

## 2012-08-04 DIAGNOSIS — I10 Essential (primary) hypertension: Secondary | ICD-10-CM

## 2012-08-04 DIAGNOSIS — E785 Hyperlipidemia, unspecified: Secondary | ICD-10-CM

## 2012-08-04 DIAGNOSIS — G894 Chronic pain syndrome: Secondary | ICD-10-CM

## 2012-08-04 DIAGNOSIS — M5136 Other intervertebral disc degeneration, lumbar region: Secondary | ICD-10-CM

## 2012-08-04 DIAGNOSIS — M797 Fibromyalgia: Secondary | ICD-10-CM

## 2012-08-04 DIAGNOSIS — Z720 Tobacco use: Secondary | ICD-10-CM

## 2012-08-04 DIAGNOSIS — D751 Secondary polycythemia: Secondary | ICD-10-CM

## 2012-08-04 DIAGNOSIS — F17200 Nicotine dependence, unspecified, uncomplicated: Secondary | ICD-10-CM

## 2012-08-04 DIAGNOSIS — M5137 Other intervertebral disc degeneration, lumbosacral region: Secondary | ICD-10-CM

## 2012-08-04 DIAGNOSIS — IMO0001 Reserved for inherently not codable concepts without codable children: Secondary | ICD-10-CM

## 2012-08-04 DIAGNOSIS — D45 Polycythemia vera: Secondary | ICD-10-CM

## 2012-08-04 LAB — CBC
MCH: 31.6 pg (ref 26.0–34.0)
MCHC: 34.8 g/dL (ref 30.0–36.0)
MCV: 90.7 fL (ref 78.0–100.0)
Platelets: 263 10*3/uL (ref 150–400)
RBC: 5.48 MIL/uL — ABNORMAL HIGH (ref 3.87–5.11)

## 2012-08-04 NOTE — Progress Notes (Signed)
  Subjective:    Patient ID: Katelyn Cline, female    DOB: [birth year], 52 y.o.   MRN: [id number]  HPI Patient here to followup interim visit for hypertension she was started on Norvasc with her Dyazide she's not taking Norvasc for the past 2 days ago she ran out of the medication. She is status post her surgery for basal cell carcinoma of the face. She is doing well. She continues to complain of joint pain in her hip and her back she states the Neurontin helps her neck and some of her pain but not all of it she is also taking her ibuprofen   Review of Systems   GEN- denies fatigue, fever, weight loss,weakness, recent illness HEENT- denies eye drainage, change in vision, nasal discharge, CVS- denies chest pain, palpitations RESP- denies SOB, cough, wheeze ABD- denies N/V, change in stools, abd pain GU- denies dysuria, hematuria, dribbling, incontinence MSK- + joint pain, muscle aches, injury Neuro- denies headache, dizziness, syncope, seizure activity      Objective:   Physical Exam GEN- NAD, alert and oriented x3 HEENT- PERRL, EOMI, non injected sclera, pink conjunctiva, MMM, oropharynx clear, skin- well healed surgical scar from forehead to nose CVS- RRR, no murmur RESP-CTAB EXT- No edema Pulses- Radial, DP- 2+        Assessment & Plan:

## 2012-08-04 NOTE — Patient Instructions (Addendum)
Referral to pain clinic Restart norvasc  Get the CBC done today  Continue to cut back on the smoking F/U 4 months

## 2012-08-05 DIAGNOSIS — D45 Polycythemia vera: Secondary | ICD-10-CM | POA: Insufficient documentation

## 2012-08-05 NOTE — Assessment & Plan Note (Signed)
Believe her polycythemia is due to her tobacco use. I will have her see hematology as her repeat labs show quite elevated hemoglobin hematocrit and white blood cell

## 2012-08-05 NOTE — Assessment & Plan Note (Signed)
Discussed importance of weight and diet control on her lipids, fish oil and statin

## 2012-08-05 NOTE — Assessment & Plan Note (Signed)
Counseled on cessation 

## 2012-08-05 NOTE — Assessment & Plan Note (Signed)
Continue neurontin 

## 2012-08-05 NOTE — Assessment & Plan Note (Signed)
Chronic back pain for 30+ years she was evaluated by orthopedics do not feel that her degree of deterioration in her back should be causing such severe pain. I gave her old TRAM but this did not help she is also on Neurontin in the past she was on narcotic medication by her previous doctors before she moved to [place]. I do not feel comfortable starting her on narcotic medications because of potential for misuse in setting of her mental illness fibromyalgia and other complaints. I will send her to a pain clinic I think she can have other modalities for her pain

## 2012-08-05 NOTE — Assessment & Plan Note (Signed)
Blood pressure improved today, continue current meds

## 2012-08-05 NOTE — Assessment & Plan Note (Signed)
See above, for chronic pain, refer to pain clinic

## 2012-08-15 ENCOUNTER — Encounter (HOSPITAL_COMMUNITY): Payer: Self-pay | Admitting: Oncology

## 2012-08-15 ENCOUNTER — Encounter (HOSPITAL_COMMUNITY): Payer: Medicare PPO | Attending: Oncology | Admitting: Oncology

## 2012-08-15 ENCOUNTER — Telehealth (HOSPITAL_COMMUNITY): Payer: Self-pay | Admitting: *Deleted

## 2012-08-15 ENCOUNTER — Telehealth (HOSPITAL_COMMUNITY): Payer: Self-pay | Admitting: Psychiatry

## 2012-08-15 VITALS — BP 153/72 | HR 78 | Temp 98.2°F | Resp 20 | Ht 67.5 in | Wt 265.0 lb

## 2012-08-15 DIAGNOSIS — F4310 Post-traumatic stress disorder, unspecified: Secondary | ICD-10-CM | POA: Insufficient documentation

## 2012-08-15 DIAGNOSIS — J4489 Other specified chronic obstructive pulmonary disease: Secondary | ICD-10-CM | POA: Insufficient documentation

## 2012-08-15 DIAGNOSIS — F17200 Nicotine dependence, unspecified, uncomplicated: Secondary | ICD-10-CM

## 2012-08-15 DIAGNOSIS — M5137 Other intervertebral disc degeneration, lumbosacral region: Secondary | ICD-10-CM | POA: Insufficient documentation

## 2012-08-15 DIAGNOSIS — J449 Chronic obstructive pulmonary disease, unspecified: Secondary | ICD-10-CM

## 2012-08-15 DIAGNOSIS — I10 Essential (primary) hypertension: Secondary | ICD-10-CM

## 2012-08-15 DIAGNOSIS — E669 Obesity, unspecified: Secondary | ICD-10-CM | POA: Insufficient documentation

## 2012-08-15 DIAGNOSIS — D751 Secondary polycythemia: Secondary | ICD-10-CM

## 2012-08-15 DIAGNOSIS — E785 Hyperlipidemia, unspecified: Secondary | ICD-10-CM | POA: Insufficient documentation

## 2012-08-15 DIAGNOSIS — Z1231 Encounter for screening mammogram for malignant neoplasm of breast: Secondary | ICD-10-CM

## 2012-08-15 DIAGNOSIS — M51379 Other intervertebral disc degeneration, lumbosacral region without mention of lumbar back pain or lower extremity pain: Secondary | ICD-10-CM | POA: Insufficient documentation

## 2012-08-15 DIAGNOSIS — F329 Major depressive disorder, single episode, unspecified: Secondary | ICD-10-CM

## 2012-08-15 DIAGNOSIS — F3289 Other specified depressive episodes: Secondary | ICD-10-CM

## 2012-08-15 DIAGNOSIS — M797 Fibromyalgia: Secondary | ICD-10-CM

## 2012-08-15 LAB — CBC WITH DIFFERENTIAL/PLATELET
Eosinophils Relative: 2 % (ref 0–5)
HCT: 48.5 % — ABNORMAL HIGH (ref 36.0–46.0)
Hemoglobin: 17.1 g/dL — ABNORMAL HIGH (ref 12.0–15.0)
Lymphocytes Relative: 22 % (ref 12–46)
Lymphs Abs: 2.5 10*3/uL (ref 0.7–4.0)
MCV: 91.2 fL (ref 78.0–100.0)
Monocytes Absolute: 0.6 10*3/uL (ref 0.1–1.0)
RBC: 5.32 MIL/uL — ABNORMAL HIGH (ref 3.87–5.11)
WBC: 11.4 10*3/uL — ABNORMAL HIGH (ref 4.0–10.5)

## 2012-08-15 LAB — BLOOD GAS, ARTERIAL
FIO2: 0.21 %
pCO2 arterial: 44.8 mmHg (ref 35.0–45.0)
pH, Arterial: 7.401 (ref 7.350–7.450)
pO2, Arterial: 55.1 mmHg — ABNORMAL LOW (ref 80.0–100.0)

## 2012-08-15 LAB — COMPREHENSIVE METABOLIC PANEL
ALT: 19 U/L (ref 0–35)
CO2: 29 mEq/L (ref 19–32)
Calcium: 9.3 mg/dL (ref 8.4–10.5)
Chloride: 100 mEq/L (ref 96–112)
Creatinine, Ser: 0.74 mg/dL (ref 0.50–1.10)
GFR calc Af Amer: >90 mL/min (ref >90)
GFR calc non Af Amer: >90 mL/min (ref >90)
Glucose, Bld: 111 mg/dL — ABNORMAL HIGH (ref 70–99)
Total Bilirubin: 0.2 mg/dL — ABNORMAL LOW (ref 0.3–1.2)

## 2012-08-15 MED ORDER — AMITRIPTYLINE HCL 25 MG PO TABS: 25.0000 mg | ORAL_TABLET | Freq: Every day | ORAL | Status: DC

## 2012-08-15 MED ORDER — BUPROPION HCL ER (SR) 150 MG PO TB12: 150.0000 mg | ORAL_TABLET | Freq: Every morning | ORAL | Status: DC

## 2012-08-15 NOTE — Patient Instructions (Addendum)
.  [facility] Discharge Instructions  RECOMMENDATIONS MADE BY THE CONSULTANT AND ANY TEST RESULTS WILL BE SENT TO YOUR REFERRING PHYSICIAN.  EXAM FINDINGS BY THE PHYSICIAN TODAY AND SIGNS OR SYMPTOMS TO REPORT TO CLINIC OR PRIMARY PHYSICIAN:  ABG's today We will set you up for CT scans to look at the size of your spleen also      SPECIAL INSTRUCTIONS/FOLLOW-UP: Return in 2 weeks  Thank you for choosing [facility] to provide your oncology and hematology care.  To afford each patient quality time with our providers, please arrive at least 15 minutes before your scheduled appointment time.  With your help, our goal is to use those 15 minutes to complete the necessary work-up to ensure our physicians have the information they need to help with your evaluation and healthcare recommendations.    Effective January 1st, 2014, we ask that you re-schedule your appointment with our physicians should you arrive 10 or more minutes late for your appointment.  We strive to give you quality time with our providers, and arriving late affects you and other patients whose appointments are after yours.    Again, thank you for choosing [facility].  Our hope is that these requests will decrease the amount of time that you wait before being seen by our physicians.       _____________________________________________________________  Should you have questions after your visit to [facility], please contact our office at [phone] between the hours of 8:30 a.m. and 5:00 p.m.  Voicemails left after 4:30 p.m. will not be returned until the following business day.  For prescription refill requests, have your pharmacy contact our office with your prescription refill request.

## 2012-08-15 NOTE — Telephone Encounter (Signed)
Sent scripts for 0.00005 tabs with message that pt needs to call and make appointment to know how many tabs to prescribe.

## 2012-08-15 NOTE — Progress Notes (Signed)
#  1 erythrocytosis, most likely secondary #2 hypertension #3 COPD secondary to long-standing smoking history #4 obesity #5 PTSD #6 hyperlipidemia #7 degenerative disc disease of the lumbar spine #8 basal cell carcinoma left side of nose status post Mohs surgery by Dr. Alean Rinne #9 history of simple hysterectomy for benign disease  Pleasant lady accompanied by her service dog, Eddie, who was found to have an elevated hemoglobin value greater than 18. She was referred for further evaluation. Her white count is minimally elevated as well. Platelets are within normal range. She does have a mildly elevated glucose in the past and a mildly elevated alkaline phosphatase in December. That will be repeated.  She has not lost weight last 6 months. She has not had fevers, chills, night sweats, history of heart disease, history of heart murmur, lumps anywhere, blood in her urine, hemoptysis, or blood in her stools.  Review of systems does reveal chronic low back pain times several years, chronic anxiety and depression, coughing intermittently. BP 153/72  Pulse 78  Temp(Src) 98.2 F (36.8 C) (Oral)  Resp 20  Ht 5' 7.5" (1.715 m)  Wt 265 lb (120.203 kg)  BMI 40.87 kg/m2  She is in very overweight individual coughing at times but not in acute distress. No lymphadenopathy is detected. She has no obvious thyromegaly. Lungs show diminished breath sounds throughout. Heart shows a regular rhythm and rate without obvious murmur rub or gallop. Breast exam is negative for masses. She does have a rash consistent with candidiasis underneath the medial portion of the right breast which extends for proximal a 6 cm. She has no obvious hepatomegaly but there is fullness in the left upper quadrant of the abdomen. Bowel sounds are diminished. She has no peripheral leg edema. Pulses in her feet are diminished to 1+. Teeth are in fair repair. Throat is clear. Pupils are equally round and reactive to light. There is a scar of her  left for head stent to her left side of her nose where she had a basal cell carcinoma resected.  She is alert and oriented. She follows all commands. She moves all extremities well.  This lady needs some blood work and CAT scans to evaluate for an occult malignancy, carbon monoxide hemoglobin value, 02 saturation, erythropoietin level, and then followup here with [place]. Her nails also showed mild discoloration consistent with hypoxia. There was no clubbing. She will need to quit smoking regardless of our findings.

## 2012-08-15 NOTE — Progress Notes (Signed)
Katelyn Cline presented for labwork. Labs per MD order drawn via Peripheral Line 25 gauge needle inserted in rt ac.  Good blood return present. Procedure without incident.  Needle removed intact. Patient tolerated procedure well.

## 2012-08-17 ENCOUNTER — Other Ambulatory Visit (HOSPITAL_COMMUNITY): Payer: Self-pay | Admitting: Oncology

## 2012-08-17 LAB — METHYLMALONIC ACID, SERUM: Methylmalonic Acid, Quantitative: 0.25 umol/L (ref <0.40)

## 2012-08-17 NOTE — Progress Notes (Signed)
Her ABG's the day first seen here clearly show significant hypoxemia with a pO2 level of 54 mmHG and a Carbon monoxide HGB level of 9.2 %!!

## 2012-08-18 LAB — JAK2 GENOTYPR: JAK2 GenotypR: DETECTED

## 2012-08-19 ENCOUNTER — Other Ambulatory Visit (HOSPITAL_COMMUNITY): Payer: Self-pay

## 2012-08-22 ENCOUNTER — Ambulatory Visit (HOSPITAL_COMMUNITY)
Admission: RE | Admit: 2012-08-22 | Discharge: 2012-08-22 | Disposition: A | Discharge status: Home / Self Care | Payer: Medicare PPO | Source: Ambulatory Visit | Attending: Oncology | Admitting: Oncology

## 2012-08-22 DIAGNOSIS — Z1231 Encounter for screening mammogram for malignant neoplasm of breast: Secondary | ICD-10-CM

## 2012-08-22 DIAGNOSIS — D751 Secondary polycythemia: Secondary | ICD-10-CM

## 2012-08-25 ENCOUNTER — Ambulatory Visit (HOSPITAL_COMMUNITY)
Admission: RE | Admit: 2012-08-25 | Discharge: 2012-08-25 | Disposition: A | Discharge status: Home / Self Care | Payer: Medicare PPO | Source: Ambulatory Visit | Attending: Oncology | Admitting: Oncology

## 2012-08-25 ENCOUNTER — Ambulatory Visit (HOSPITAL_COMMUNITY): Payer: Medicare PPO

## 2012-08-25 DIAGNOSIS — D45 Polycythemia vera: Secondary | ICD-10-CM | POA: Insufficient documentation

## 2012-08-25 DIAGNOSIS — R911 Solitary pulmonary nodule: Secondary | ICD-10-CM | POA: Insufficient documentation

## 2012-08-25 DIAGNOSIS — D751 Secondary polycythemia: Secondary | ICD-10-CM

## 2012-08-26 ENCOUNTER — Other Ambulatory Visit (HOSPITAL_COMMUNITY): Payer: Self-pay | Admitting: Oncology

## 2012-08-26 DIAGNOSIS — R911 Solitary pulmonary nodule: Secondary | ICD-10-CM

## 2012-08-26 DIAGNOSIS — R928 Other abnormal and inconclusive findings on diagnostic imaging of breast: Secondary | ICD-10-CM

## 2012-08-30 ENCOUNTER — Ambulatory Visit (HOSPITAL_COMMUNITY): Payer: Self-pay | Admitting: Oncology

## 2012-09-02 ENCOUNTER — Encounter (HOSPITAL_COMMUNITY): Payer: Medicare PPO

## 2012-09-06 ENCOUNTER — Encounter (HOSPITAL_COMMUNITY): Payer: Self-pay | Admitting: Psychiatry

## 2012-09-06 ENCOUNTER — Ambulatory Visit (HOSPITAL_COMMUNITY): Payer: Self-pay | Admitting: Oncology

## 2012-09-06 ENCOUNTER — Ambulatory Visit (INDEPENDENT_AMBULATORY_CARE_PROVIDER_SITE_OTHER): Payer: Medicare PPO | Admitting: Psychiatry

## 2012-09-06 VITALS — Wt 268.0 lb

## 2012-09-06 DIAGNOSIS — M797 Fibromyalgia: Secondary | ICD-10-CM

## 2012-09-06 DIAGNOSIS — F32A Depression, unspecified: Secondary | ICD-10-CM

## 2012-09-06 DIAGNOSIS — F3289 Other specified depressive episodes: Secondary | ICD-10-CM

## 2012-09-06 DIAGNOSIS — Z720 Tobacco use: Secondary | ICD-10-CM

## 2012-09-06 DIAGNOSIS — G894 Chronic pain syndrome: Secondary | ICD-10-CM

## 2012-09-06 MED ORDER — BUPROPION HCL ER (SR) 200 MG PO TB12: 200.0000 mg | ORAL_TABLET | Freq: Every morning | ORAL | Status: DC

## 2012-09-06 MED ORDER — AMITRIPTYLINE HCL 25 MG PO TABS: 25.0000 mg | ORAL_TABLET | Freq: Every day | ORAL | Status: DC

## 2012-09-06 MED ORDER — GABAPENTIN 100 MG PO CAPS: ORAL_CAPSULE | ORAL | Status: DC

## 2012-09-06 NOTE — Patient Instructions (Signed)
Hold the Elavil for a week and take the increased dose of Wellbutrin to see how much the Elavil is actually helping with getting to sleep or depression or pain management.  Call if problems or concerns.
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
## 2012-09-07 ENCOUNTER — Other Ambulatory Visit: Payer: Self-pay | Admitting: Family Medicine

## 2012-09-07 ENCOUNTER — Ambulatory Visit (HOSPITAL_COMMUNITY)
Admission: RE | Admit: 2012-09-07 | Discharge: 2012-09-07 | Disposition: A | Discharge status: Home / Self Care | Payer: Medicare PPO | Source: Ambulatory Visit | Attending: Oncology | Admitting: Oncology

## 2012-09-07 ENCOUNTER — Other Ambulatory Visit (HOSPITAL_COMMUNITY): Payer: Self-pay | Admitting: Family Medicine

## 2012-09-07 ENCOUNTER — Ambulatory Visit (HOSPITAL_COMMUNITY)
Admission: RE | Admit: 2012-09-07 | Discharge: 2012-09-07 | Disposition: A | Discharge status: Home / Self Care | Payer: Medicare PPO | Source: Ambulatory Visit | Attending: Family Medicine | Admitting: Family Medicine

## 2012-09-07 DIAGNOSIS — Z780 Asymptomatic menopausal state: Secondary | ICD-10-CM

## 2012-09-07 DIAGNOSIS — R928 Other abnormal and inconclusive findings on diagnostic imaging of breast: Secondary | ICD-10-CM

## 2012-09-07 DIAGNOSIS — N63 Unspecified lump in unspecified breast: Secondary | ICD-10-CM | POA: Insufficient documentation

## 2012-09-08 ENCOUNTER — Encounter (HOSPITAL_COMMUNITY)
Admission: RE | Admit: 2012-09-08 | Discharge: 2012-09-08 | Disposition: A | Discharge status: Home / Self Care | Payer: Medicare PPO | Source: Ambulatory Visit | Attending: Oncology | Admitting: Oncology

## 2012-09-08 DIAGNOSIS — K5730 Diverticulosis of large intestine without perforation or abscess without bleeding: Secondary | ICD-10-CM | POA: Insufficient documentation

## 2012-09-08 DIAGNOSIS — R911 Solitary pulmonary nodule: Secondary | ICD-10-CM | POA: Insufficient documentation

## 2012-09-08 DIAGNOSIS — Z90710 Acquired absence of both cervix and uterus: Secondary | ICD-10-CM | POA: Insufficient documentation

## 2012-09-08 DIAGNOSIS — I700 Atherosclerosis of aorta: Secondary | ICD-10-CM | POA: Insufficient documentation

## 2012-09-08 MED ORDER — TECHNETIUM TC 99M SULFUR COLLOID: 2.2000 | Freq: Once | INTRAVENOUS | Status: DC | PRN

## 2012-09-08 MED ORDER — FLUDEOXYGLUCOSE F - 18 (FDG) INJECTION
18.6000 | Freq: Once | INTRAVENOUS | Status: AC | PRN
  Administered 2012-09-08: 18.6 via INTRAVENOUS

## 2012-09-13 ENCOUNTER — Encounter (HOSPITAL_COMMUNITY): Payer: Self-pay | Admitting: Oncology

## 2012-09-13 ENCOUNTER — Encounter (HOSPITAL_COMMUNITY): Payer: Medicare HMO | Attending: Oncology | Admitting: Oncology

## 2012-09-13 VITALS — BP 150/85 | HR 77 | Temp 97.7°F | Resp 20

## 2012-09-13 DIAGNOSIS — R911 Solitary pulmonary nodule: Secondary | ICD-10-CM | POA: Insufficient documentation

## 2012-09-13 DIAGNOSIS — R0902 Hypoxemia: Secondary | ICD-10-CM

## 2012-09-13 DIAGNOSIS — D45 Polycythemia vera: Secondary | ICD-10-CM | POA: Insufficient documentation

## 2012-09-13 DIAGNOSIS — D751 Secondary polycythemia: Secondary | ICD-10-CM

## 2012-09-13 DIAGNOSIS — F4310 Post-traumatic stress disorder, unspecified: Secondary | ICD-10-CM

## 2012-09-13 LAB — CBC WITH DIFFERENTIAL/PLATELET
Eosinophils Absolute: 0.2 10*3/uL (ref 0.0–0.7)
Eosinophils Relative: 2 % (ref 0–5)
HCT: 49.2 % — ABNORMAL HIGH (ref 36.0–46.0)
Hemoglobin: 16.9 g/dL — ABNORMAL HIGH (ref 12.0–15.0)
Lymphocytes Relative: 22 % (ref 12–46)
Lymphs Abs: 2.3 10*3/uL (ref 0.7–4.0)
MCH: 31.3 pg (ref 26.0–34.0)
MCV: 91.1 fL (ref 78.0–100.0)
Monocytes Absolute: 0.5 10*3/uL (ref 0.1–1.0)
Monocytes Relative: 4 % (ref 3–12)
Platelets: 275 10*3/uL (ref 150–400)
RBC: 5.4 MIL/uL — ABNORMAL HIGH (ref 3.87–5.11)
WBC: 10.7 10*3/uL — ABNORMAL HIGH (ref 4.0–10.5)

## 2012-09-13 LAB — BASIC METABOLIC PANEL
BUN: 15 mg/dL (ref 6–23)
CO2: 27 mEq/L (ref 19–32)
Calcium: 9.3 mg/dL (ref 8.4–10.5)
Creatinine, Ser: 0.54 mg/dL (ref 0.50–1.10)
GFR calc non Af Amer: >90 mL/min (ref >90)
Glucose, Bld: 194 mg/dL — ABNORMAL HIGH (ref 70–99)
Sodium: 137 mEq/L (ref 135–145)

## 2012-09-13 NOTE — Progress Notes (Signed)
Katelyn Cline presented for labwork. Labs per MD order drawn via Peripheral Line 25 gauge needle inserted in rt forearm  Good blood return present. Procedure without incident.  Needle removed intact. Patient tolerated procedure well.

## 2012-09-13 NOTE — Patient Instructions (Addendum)
.  [facility] Discharge Instructions  RECOMMENDATIONS MADE BY THE CONSULTANT AND ANY TEST RESULTS WILL BE SENT TO YOUR REFERRING PHYSICIAN.  EXAM FINDINGS BY THE PHYSICIAN TODAY AND SIGNS OR SYMPTOMS TO REPORT TO CLINIC OR PRIMARY PHYSICIAN: Discussed PET findings Will refer you to a thoracic surgeon for consult for lung nodule   INSTRUCTIONS GIVEN AND DISCUSSED: We will set you up for MRI.   SPECIAL INSTRUCTIONS/FOLLOW-UP: Return to see [place] in 2 months  Thank you for choosing [facility] to provide your oncology and hematology care.  To afford each patient quality time with our providers, please arrive at least 15 minutes before your scheduled appointment time.  With your help, our goal is to use those 15 minutes to complete the necessary work-up to ensure our physicians have the information they need to help with your evaluation and healthcare recommendations.    Effective January 1st, 2014, we ask that you re-schedule your appointment with our physicians should you arrive 10 or more minutes late for your appointment.  We strive to give you quality time with our providers, and arriving late affects you and other patients whose appointments are after yours.    Again, thank you for choosing [facility] [place].  Our hope is that these requests will decrease the amount of time that you wait before being seen by our physicians.       _____________________________________________________________  Should you have questions after your visit to [facility], please contact our office at [phone] between the hours of 8:30 a.m. and 5:00 p.m.  Voicemails left after 4:30 p.m. will not be returned until the following business day.  For prescription refill requests, have your pharmacy contact our office with your prescription refill request.

## 2012-09-13 NOTE — Progress Notes (Signed)
#  1 erythrocytosis, consistent with polycythemia vera, Jak-2 positive, with a significant element of secondary erythrocytosis with any elevated erythropoietin level, relative to her hemoglobin value. She has significant hypoxemia driving this elevation. She has significant lung disease. Therefore she has 2 major elements cause erythrocytosis.  #2 right lung nodule very worrisome for Lung cancer with a positive PET scan in the right nodule only. MRI of the brain will be scheduled to make sure she does not have brain metastases prior to surgical intervention if it is possible to resect  #3 basal cell carcinoma left side of nose status post Mohs surgery by Dr. Alean Rinne  #4 long-standing smoking history, down to 3 cigarettes a day and she knows she must quit if she is to have surgery  #5 PTSD  #6 hyperlipidemia with xanthelasma  #7 simple hysterectomy in the past for benign disease  #8 obesity  #9 hypertension  She is here today with her son Onalee Hua and her service dog, Eddie. She has several problems as addressed above but we spent a long time greater than 30 minutes discussing what she has, why she has it the need for phlebotomies the need to quit smoking. She is not wheezing nor does he admit to shortness of breath presently with exertion. She gets short of breath she states only when anxious. Therefore I do not think she needs oxygen now. Her low PO2 however of 55 is impressive. I would not have guessed it would be this low based upon her CT scan.  We then addressed the PET scan and CAT scan. She understands with a nodule looks like. It appears to be in the upper lobe but resting against the lobar margin.  We will schedule her for scan of the brain, then CVT S. consultation, and hopefully they will be able to operate on her.  In the meantime she must be bled to a hemoglobin of less than 15 g and a hematocrit of less than 45% for her safety. She wants to quit smoking without any nicotine aids at  this time. We will see her back after her visits with a cardiovascular/pulmonary surgeon and hopefully after surgery is performed. Her PET scan did not reveal disease outside of this nodule.

## 2012-09-14 ENCOUNTER — Other Ambulatory Visit (HOSPITAL_COMMUNITY): Payer: Self-pay

## 2012-09-14 ENCOUNTER — Encounter (HOSPITAL_COMMUNITY): Payer: Self-pay

## 2012-09-14 ENCOUNTER — Encounter (HOSPITAL_BASED_OUTPATIENT_CLINIC_OR_DEPARTMENT_OTHER): Payer: Medicare HMO

## 2012-09-14 DIAGNOSIS — D45 Polycythemia vera: Secondary | ICD-10-CM

## 2012-09-14 DIAGNOSIS — D751 Secondary polycythemia: Secondary | ICD-10-CM

## 2012-09-14 DIAGNOSIS — R911 Solitary pulmonary nodule: Secondary | ICD-10-CM

## 2012-09-14 NOTE — Progress Notes (Signed)
Katelyn Cline presents today for phlebotomy per MD orders. Phlebotomy procedure started at 1005 and ended at 1140. Approximately 500cc removed. Patient tolerated procedure well. IV needle removed intact.  Patient had to be stuck 7 times in order to obtain an approximate 500cc. I tried using the 16 gauge TPR needle however the blood would not flow. Therefore, patient had to be stuck using a butterfly needle 6 times in order to obtain the blood. Patient tolerated the needle sticks without difficulty. All sites were cleaned prior to venipuncture with chlorhexidine alcohol swabs and were WNL after the needle was removed. Patient had scant amount of blood on gauze after removing it from the venipuncture sites. Patient drank 16 oz of fluid while here and instructed to drink plenty of fluids the day before and morning of next TPR which is scheduled for next Wednesday.

## 2012-09-16 ENCOUNTER — Ambulatory Visit (HOSPITAL_COMMUNITY): Payer: Self-pay | Admitting: Oncology

## 2012-09-19 ENCOUNTER — Encounter (HOSPITAL_COMMUNITY): Payer: Self-pay

## 2012-09-19 ENCOUNTER — Ambulatory Visit (HOSPITAL_COMMUNITY)
Admission: RE | Admit: 2012-09-19 | Discharge: 2012-09-19 | Disposition: A | Discharge status: Home / Self Care | Payer: Medicare HMO | Source: Ambulatory Visit | Attending: Oncology | Admitting: Oncology

## 2012-09-19 DIAGNOSIS — D751 Secondary polycythemia: Secondary | ICD-10-CM

## 2012-09-19 DIAGNOSIS — R911 Solitary pulmonary nodule: Secondary | ICD-10-CM | POA: Insufficient documentation

## 2012-09-19 DIAGNOSIS — R51 Headache: Secondary | ICD-10-CM | POA: Insufficient documentation

## 2012-09-19 MED ORDER — GADOBENATE DIMEGLUMINE 529 MG/ML IV SOLN
20.0000 mL | Freq: Once | INTRAVENOUS | Status: AC | PRN
  Administered 2012-09-19: 20 mL via INTRAVENOUS

## 2012-09-20 ENCOUNTER — Encounter: Payer: Self-pay | Admitting: Cardiothoracic Surgery

## 2012-09-20 ENCOUNTER — Institutional Professional Consult (permissible substitution) (INDEPENDENT_AMBULATORY_CARE_PROVIDER_SITE_OTHER): Payer: Medicare HMO | Admitting: Cardiothoracic Surgery

## 2012-09-20 VITALS — BP 135/80 | HR 75 | Resp 20 | Ht 67.5 in | Wt 268.0 lb

## 2012-09-20 DIAGNOSIS — D45 Polycythemia vera: Secondary | ICD-10-CM

## 2012-09-20 DIAGNOSIS — D751 Secondary polycythemia: Secondary | ICD-10-CM

## 2012-09-20 DIAGNOSIS — R911 Solitary pulmonary nodule: Secondary | ICD-10-CM

## 2012-09-20 NOTE — Progress Notes (Signed)
[address]            [place] [address]          [phone]      Katelyn Cline [facility] Medical Record #[id number] Date of Birth: 05/06/1961  Referring: Randall An, MD Primary Care: [name], MD  Chief Complaint:    Chief Complaint  Patient presents with  . Lung Lesion    Surgical eval on right upper lobe lung nodule, PET Scan 09/08/12, Chest CT 08/25/12, MRI Brain 09/19/12    History of Present Illness:    Patient with long term history of smoking referred to oncology for evaluation of polycythemia. CT of chest and abdomen was done and a lung nodule was noted . PET scan done and patient referred to thoracic surgery for evaluation of lung nodule.Po2 noted to be 55 on abg. Patient has sob with activity.      Current Activity/ Functional Status:  Patient is independent with mobility/ambulation, transfers, ADL's, IADL's.  Zubrod Score: At the time of surgery this patient's most appropriate activity status/level should be described as: []  Normal activity, no symptoms [x]  Symptoms, fully ambulatory []  Symptoms, in bed less than or equal to 50% of the time []  Symptoms, in bed greater than 50% of the time but less than 100% []  Bedridden []  Moribund   Past Medical History  Diagnosis Date  . Narcolepsy   . HTN (hypertension)   . Fibromyalgia   . Scoliosis   . Migraines   . Major depression   . Panic attacks   . Agoraphobia   . Chronic pain   . Miscarriage     x 4  . Peripheral neuropathy   . Hyperlipidemia   . Basal cell carcinoma   . Elevated hemoglobin 2014  . Vertigo   . Polycythemia secondary to smoking   . Polycythemia vera     Past Surgical History  Procedure Laterality Date  . Vaginal hysterectomy    . Breast lumpectomy      bilateral  . Cystectomy      abdominal wall   . Basal cell carcinoma excision      Family History  Problem Relation Age of Onset  . Arthritis Mother   . Hyperlipidemia  Mother   . Depression Mother   . Anxiety disorder Mother   . Dementia Mother   . Hypertension Father   . Hyperlipidemia Father   . Heart disease Father   . Stroke Father   . Dementia Father   . Heart disease Brother   . Alcohol abuse Maternal Grandfather   . Bipolar disorder Neg Hx   . Drug abuse Neg Hx   . OCD Neg Hx   . Paranoid behavior Neg Hx   . Schizophrenia Neg Hx   . Seizures Neg Hx   . Sexual abuse Neg Hx   . Physical abuse Neg Hx   . ADD / ADHD Son       History  Smoking status  . Current Every Day Smoker -- 2.00 packs/day for 5 years  . Types: Cigarettes  Smokeless tobacco  . Not on file    Comment: has cut down to 2 cigs or less a day    History  Alcohol Use  . Yes    Comment: 1 -2 drinks a month     Allergies  Allergen Reactions  . Contrast Media (  Iodinated Diagnostic Agents) Anaphylaxis  . Flagyl (Metronidazole)   . Penicillins   . Tetracyclines & Related     Current Outpatient Prescriptions  Medication Sig Dispense Refill  . amitriptyline (ELAVIL) 25 MG tablet Take 1 tablet (25 mg total) by mouth at bedtime. For depression, anxiety, insomnia, and pain as well as fibromyalgia  30 tablet  1  . amLODipine (NORVASC) 5 MG tablet Take 1 tablet (5 mg total) by mouth daily.  30 tablet  3  . Aspirin-Acetaminophen-Caffeine (EXCEDRIN PO) Take by mouth.      Marland Kitchen atorvastatin (LIPITOR) 40 MG tablet Take 40 mg by mouth daily.       Marland Kitchen buPROPion (WELLBUTRIN SR) 200 MG 12 hr tablet Take 1 tablet (200 mg total) by mouth every morning.  30 tablet  1  . DULoxetine (CYMBALTA) 60 MG capsule Take 1 capsule (60 mg total) by mouth daily.  30 capsule  3  . gabapentin (NEURONTIN) 100 MG capsule Take by mouth two to three up to 5 times a day. FOR PAIN and ANXIETY  200 capsule  1  . ibuprofen (ADVIL,MOTRIN) 800 MG tablet Take 1 tablet (800 mg total) by mouth every 8 (eight) hours as needed for pain.  60 tablet  2  . Omega-3 Fatty Acids (FISH OIL PO) Take by mouth.      .  triamterene-hydrochlorothiazide (DYAZIDE) 37.5-25 MG per capsule Once daily  30 capsule  0  . modafinil (PROVIGIL) 200 MG tablet One tab three times daily  90 tablet  0   No current facility-administered medications for this visit.       Review of Systems:     Cardiac Review of Systems: Y or N  Chest Pain [ n   ]  Resting SOB Cove.Etienne   ] Exertional SOB  [ y ]  Pollyann Kennedy Milo.Brash  ]   Pedal Edema [ n  ]    Palpitations [ y ] Syncope  [n  ]   Presyncope [n   ]  General Review of Systems: [Y] = yes [  ]=no Constitional: recent weight change [ n ]; anorexia [  ]; fatigue [ y ]; nausea [  ]; night sweats [  ]; fever [  ]; or chills [  ];                                                                                                                                          Dental: poor dentition[ y ]; Last Dentist visit:   Eye : blurred vision [  ]; diplopia [   ]; vision changes [  ];  Amaurosis fugax[  ]; Resp: cough [  ];  wheezing[y  ];  hemoptysis[ n ]; shortness of breath[y  ]; paroxysmal nocturnal dyspnea[ y ]; dyspnea on exertion[y  ]; or orthopnea[y  ];  GI:  gallstones[  ], vomiting[  ];  dysphagia[  ]; melena[  ];  hematochezia [  ]; heartburn[  ];   Hx of  Colonoscopy[  ]; GU: kidney stones [  ]; hematuria[  ];   dysuria [ y ];  nocturia[  ];  history of     obstruction [  ]; urinary frequency [  ]             Skin: rash, swelling[  ];, hair loss[  ];  peripheral edema[  ];  or itching[  ]; Musculosketetal: myalgias[ y ];  joint swelling[ y ];  joint erythema[  ];  joint pain[  ];  back pain[ y ];  Heme/Lymph: bruising[  ];  bleeding[  ];  anemia[  ];  Neuro: TIA[  ];  headaches[  ];  stroke[  ];  vertigo[  ];  seizures[  ];   paresthesias[  ];  difficulty walking[  ];  Psych:depression[ y ]; anxiety[ y ];  Endocrine: diabetes[  ];  thyroid dysfunction[  ];  Immunizations: Flu [  ]; Pneumococcal[  ];  Other:  Physical Exam: BP 135/80  Pulse 75  Resp 20  Ht 5' 7.5" (1.715 m)  Wt 268 lb  (121.564 kg)  BMI 41.33 kg/m2  SpO2 96%  General appearance: alert, cooperative, appears older than stated age, no distress and morbidly obese Neurologic: intact Heart: regular rate and rhythm, S1, S2 normal, no murmur, click, rub or gallop and normal apical impulse Lungs: diminished breath sounds bilaterally Abdomen: soft, non-tender; bowel sounds normal; no masses,  no organomegaly Extremities: extremities normal, atraumatic, no cyanosis or edema no carotid bruits, healing scar lrft nose fac from resection of basal cell CA palpable dt dp bilaterial  Diagnostic Studies & Laboratory data:     Recent Radiology Findings:  Ct Abdomen Pelvis Wo Contrast  08/25/2012  *RADIOLOGY REPORT*  Clinical Data:  Polycythemia.  Erythrocytosis.  Smoker long-term  CT CHEST, ABDOMEN AND PELVIS WITHOUT CONTRAST  Technique:  Multidetector CT imaging of the chest, abdomen and pelvis was performed following the standard protocol without IV contrast.  Comparison:   None.  CT CHEST  Findings:  There is no axillary or supraclavicular lymphadenopathy. No mediastinal or hilar lymphadenopathy.  5 mm right lower paratracheal lymph node is not pathologic by size criteria.  No pericardial fluid.  Coronary calcifications are present.  Review of the lung parenchyma demonstrates a solitary 12 mm nodule within the central right upper lobe (image 30).  Lesion does have low attenuation ( Hounsfield units less than zero but this is a small lesion and low density could reflect volume averaging). There is a fine reticular pattern with some nodularity in the right upper lobe  and right middle lobe which may represent bronchiolitis.  IMPRESSION:  1.  Solitary low attenuation right upper lobe pulmonary nodule is concerning for bronchogenic carcinoma versus hamartoma.  In patient with longstanding smoking history,   recommend FDG PET CT scan to determine malignant potential. 2.  Mild reticular pattern in the upper lobe may relate to smoking  history. 3.  No evidence of mediastinal lymphadenopathy. 4.  Coronary artery calcifications noted.  Non-IV contrast  CT ABDOMEN AND PELVIS  Findings:  Non-IV contrast images demonstrate no focal hepatic lesion.  Gallbladder, pancreas, spleen, adrenal glands are normal. The kidneys are normal.  The stomach, small bowel, appendix, and cecum are normal.  The colon and rectum are unremarkable.  Abdominal aorta normal caliber.  No retroperitoneal periportal lymphadenopathy.  No free fluid the pelvis.  Hysterectomy anatomy.  The bladder is normal.  No pelvic lymphadenopathy. Review of  bone windows demonstrates no aggressive osseous lesions.  IMPRESSION: 1.  No evidence of adenopathy or malignancy in the abdomen or pelvis. 2.  Please see above chest section for solitary pulmonary nodule.   Original Report Authenticated By: Genevive Bi, M.D.    Ct Chest Wo Contrast  08/25/2012  *RADIOLOGY REPORT*  Clinical Data:  Polycythemia.  Erythrocytosis.  Smoker long-term  CT CHEST, ABDOMEN AND PELVIS WITHOUT CONTRAST  Technique:  Multidetector CT imaging of the chest, abdomen and pelvis was performed following the standard protocol without IV contrast.  Comparison:   None.  CT CHEST  Findings:  There is no axillary or supraclavicular lymphadenopathy. No mediastinal or hilar lymphadenopathy.  5 mm right lower paratracheal lymph node is not pathologic by size criteria.  No pericardial fluid.  Coronary calcifications are present.  Review of the lung parenchyma demonstrates a solitary 12 mm nodule within the central right upper lobe (image 30).  Lesion does have low attenuation ( Hounsfield units less than zero but this is a small lesion and low density could reflect volume averaging). There is a fine reticular pattern with some nodularity in the right upper lobe  and right middle lobe which may represent bronchiolitis.  IMPRESSION:  1.  Solitary low attenuation right upper lobe pulmonary nodule is concerning for bronchogenic  carcinoma versus hamartoma.  In patient with longstanding smoking history,   recommend FDG PET CT scan to determine malignant potential. 2.  Mild reticular pattern in the upper lobe may relate to smoking history. 3.  No evidence of mediastinal lymphadenopathy. 4.  Coronary artery calcifications noted.  Non-IV contrast  CT ABDOMEN AND PELVIS  Findings:  Non-IV contrast images demonstrate no focal hepatic lesion.  Gallbladder, pancreas, spleen, adrenal glands are normal. The kidneys are normal.  The stomach, small bowel, appendix, and cecum are normal.  The colon and rectum are unremarkable.  Abdominal aorta normal caliber.  No retroperitoneal periportal lymphadenopathy.  No free fluid the pelvis.  Hysterectomy anatomy.  The bladder is normal.  No pelvic lymphadenopathy. Review of  bone windows demonstrates no aggressive osseous lesions.  IMPRESSION: 1.  No evidence of adenopathy or malignancy in the abdomen or pelvis. 2.  Please see above chest section for solitary pulmonary nodule.   Original Report Authenticated By: Genevive Bi, M.D.    Mr Laqueta Jean Wo Contrast  09/19/2012  *RADIOLOGY REPORT*  Clinical Data: 52 year old female with lung nodule suspicious for bronchogenic carcinoma.  Headache.  Polycythemia.  MRI HEAD WITHOUT AND WITH CONTRAST  Technique:  Multiplanar, multiecho pulse sequences of the brain and surrounding structures were obtained according to standard protocol without and with intravenous contrast  Contrast: 20mL MULTIHANCE GADOBENATE DIMEGLUMINE 529 MG/ML IV SOLN  Comparison: PET CT 09/08/2012.  Findings: No abnormal enhancement identified.  No abnormal diffusion.  No areas of cerebral edema identified. No midline shift, mass effect, or evidence of mass lesion.  Cerebral volume is within normal limits for age.  No restricted diffusion to suggest acute infarction.  No ventriculomegaly. No acute intracranial hemorrhage identified.  Negative pituitary, cervicomedullary junction visualized  cervical spine. Major intracranial vascular flow voids are preserved.  Normal for age gray-white matter signal throughout the brain.  Normal bone marrow signal at the skull base.  No suspicious calvarial lesion identified. Visualized paranasal sinuses and mastoids are clear.  Negative scalp soft tissues.  IMPRESSION: No acute or metastatic intracranial abnormality. Negative MRI appearance of  the brain.   Original Report Authenticated By: Erskine Speed, M.D.    US Breast Right  09/07/2012  *RADIOLOGY REPORT*  Clinical Data:  Mass right breast identified on recent screening mammogram.  The patient states that prior to February 2014, her most recent mammogram was 10 years or greater ago.  DIGITAL DIAGNOSTIC BILATERAL MAMMOGRAM WITHOUT CAD AND RIGHT BREAST ULTRASOUND:  Comparison:  08/22/2012  Findings:  ACR Breast Density Category 1: The breast tissue is almost entirely fatty.  Focal spot compression views of the deep aspect of the 6 o'clock region of the right breast show a circumscribed oblong oval nodule, with a possible fatty hilum noted in the MLO projection.  Focal spot compression view of the left breast in the MLO projection shows no suspicious mass or distortion.  On physical exam, no mass is palpated in the 6 o'clock region of the right breast.  Ultrasound is performed, showing approximately 10 x 3 x 9 mm circumscribed oval mass with peripheral hypoechogenicity and central echogenicity, most consistent with a benign intramammary lymph node, at 6 o'clock position 5 cm from the nipple. Color flow is seen in the fatty hilum, and adjacent to the probable lymph node.  IMPRESSION: Probable intramammary lymph node right breast 6 o'clock position 5 cm from the nipple.  RECOMMENDATION: 16-month follow-up right breast ultrasound.  I have discussed the findings and recommendations with the patient. Results were also provided in writing at the conclusion of the visit.  BI-RADS CATEGORY 3:  Probably benign finding(s) -  short interval follow-up suggested.   Original Report Authenticated By: Britta Mccreedy, M.D.    Nm Pet Image Initial (pi) Skull Base To Thigh  09/08/2012  *RADIOLOGY REPORT*  Clinical Data: Initial treatment strategy for solitary right upper lobe pulmonary nodule.  NUCLEAR MEDICINE PET SKULL BASE TO THIGH  Fasting Blood Glucose:  148  Technique:  18.6 mCi F-18 FDG was injected intravenously. CT data was obtained and used for attenuation correction and anatomic localization only.  (This was not acquired as a diagnostic CT examination.) Additional exam technical data entered on technologist worksheet.  Comparison:  CT chest abdomen pelvis dated 08/25/2012  Findings:  Neck: No hypermetabolic lymph nodes in the neck.  Chest:  12 x 9 mm posterior right upper lobe pulmonary nodule (series 2/image 89) max SUV 3.0.  No hypermetabolic mediastinal or hilar nodes.  Suspected mild coronary atherosclerosis.  Atherosclerotic calcifications of the aortic arch.  Abdomen/Pelvis:  No abnormal hypermetabolic activity within the liver, pancreas, adrenal glands, or spleen.  Atherosclerotic calcifications of the abdominal aorta and branch vessels.  Colonic diverticulosis, without associated inflammatory changes.  Prior hysterectomy.  No hypermetabolic lymph nodes in the abdomen or pelvis.  Skeleton:  No focal hypermetabolic activity to suggest skeletal metastasis.  IMPRESSION: 12 mm posterior right upper lobe pulmonary nodule, max SUV 3.0, suspicious for primary bronchogenic neoplasm.  No evidence of metastasis.   Original Report Authenticated By: Charline Bills, M.D.    Mm Digital Diagnostic Bilat  09/07/2012  *RADIOLOGY REPORT*  Clinical Data:  Mass right breast identified on recent screening mammogram.  The patient states that prior to February 2014, her most recent mammogram was 10 years or greater ago.  DIGITAL DIAGNOSTIC BILATERAL MAMMOGRAM WITHOUT CAD AND RIGHT BREAST ULTRASOUND:  Comparison:  08/22/2012  Findings:  ACR  Breast Density Category 1: The breast tissue is almost entirely fatty.  Focal spot compression views of the deep aspect of the 6 o'clock region of the right breast show  a circumscribed oblong oval nodule, with a possible fatty hilum noted in the MLO projection.  Focal spot compression view of the left breast in the MLO projection shows no suspicious mass or distortion.  On physical exam, no mass is palpated in the 6 o'clock region of the right breast.  Ultrasound is performed, showing approximately 10 x 3 x 9 mm circumscribed oval mass with peripheral hypoechogenicity and central echogenicity, most consistent with a benign intramammary lymph node, at 6 o'clock position 5 cm from the nipple. Color flow is seen in the fatty hilum, and adjacent to the probable lymph node.  IMPRESSION: Probable intramammary lymph node right breast 6 o'clock position 5 cm from the nipple.  RECOMMENDATION: 54-month follow-up right breast ultrasound.  I have discussed the findings and recommendations with the patient. Results were also provided in writing at the conclusion of the visit.  BI-RADS CATEGORY 3:  Probably benign finding(s) - short interval follow-up suggested.   Original Report Authenticated By: Britta Mccreedy, M.D.    Mm Digital Screening  08/26/2012  *RADIOLOGY REPORT*  Clinical Data: Screening.  DIGITAL BILATERAL SCREENING MAMMOGRAM WITH CAD  Comparison:  None.  FINDINGS:  ACR Breast Density Category 2: There is a scattered fibroglandular pattern.  Possible masses are noted in both breasts.  Images were processed with CAD.  IMPRESSION: Further evaluation is suggested for possible masses in both breasts.  RECOMMENDATION: Diagnostic mammogram and possibly ultrasound of both breasts. (Code:FI-B-47M)  The patient will be contacted regarding the findings, and additional imaging will be scheduled.  BI-RADS CATEGORY 0:  Incomplete.  Need additional imaging evaluation and/or prior mammograms for comparison.   Original Report  Authenticated By: Beckie Salts, M.D.    Mr Laqueta Jean Wo Contrast  09/19/2012  *RADIOLOGY REPORT*  Clinical Data: 52 year old female with lung nodule suspicious for bronchogenic carcinoma.  Headache.  Polycythemia.  MRI HEAD WITHOUT AND WITH CONTRAST  Technique:  Multiplanar, multiecho pulse sequences of the brain and surrounding structures were obtained according to standard protocol without and with intravenous contrast  Contrast: 20mL MULTIHANCE GADOBENATE DIMEGLUMINE 529 MG/ML IV SOLN  Comparison: PET CT 09/08/2012.  Findings: No abnormal enhancement identified.  No abnormal diffusion.  No areas of cerebral edema identified. No midline shift, mass effect, or evidence of mass lesion.  Cerebral volume is within normal limits for age.  No restricted diffusion to suggest acute infarction.  No ventriculomegaly. No acute intracranial hemorrhage identified.  Negative pituitary, cervicomedullary junction visualized cervical spine. Major intracranial vascular flow voids are preserved.  Normal for age gray-white matter signal throughout the brain.  Normal bone marrow signal at the skull base.  No suspicious calvarial lesion identified. Visualized paranasal sinuses and mastoids are clear.  Negative scalp soft tissues.  IMPRESSION: No acute or metastatic intracranial abnormality. Negative MRI appearance of the brain.   Original Report Authenticated By: Erskine Speed, M.D.       Recent Lab Findings: Lab Results  Component Value Date   WBC 10.7* 09/13/2012   HGB 16.9* 09/13/2012   HCT 49.2* 09/13/2012   PLT 275 09/13/2012   GLUCOSE 194* 09/13/2012   CHOL 195 06/09/2012   TRIG 359* 06/09/2012   HDL 34* 06/09/2012   LDLCALC 89 06/09/2012   ALT 19 08/15/2012   AST 16 08/15/2012   NA 137 09/13/2012   K 3.9 09/13/2012   CL 98 09/13/2012   CREATININE 0.54 09/13/2012   BUN 15 09/13/2012   CO2 27 09/13/2012   TSH 3.474 06/09/2012  HGBA1C 6.2* 06/09/2012      Assessment / Plan:      Right upper lobe lung nodule in long  term smoker suspicious for Lung cancer , but could be hamartoma Multifactorial polycythemia, smoking  related/hypoxia and polycythemia vera, Jak-2 positive Suspect sever underlying pulmonary disease. Before considering surgical resection vs bx and stereotactic radiotherapy will check full pfts and abg and see patient back after complete.      Delight Ovens MD  Beeper [phone] Office [phone] 09/20/2012 10:14 PM

## 2012-09-21 ENCOUNTER — Encounter (HOSPITAL_BASED_OUTPATIENT_CLINIC_OR_DEPARTMENT_OTHER): Payer: Medicare HMO

## 2012-09-21 ENCOUNTER — Encounter (HOSPITAL_COMMUNITY): Payer: Medicare HMO

## 2012-09-21 ENCOUNTER — Other Ambulatory Visit: Payer: Self-pay | Admitting: *Deleted

## 2012-09-21 VITALS — BP 148/78 | HR 83

## 2012-09-21 DIAGNOSIS — D45 Polycythemia vera: Secondary | ICD-10-CM

## 2012-09-21 DIAGNOSIS — R911 Solitary pulmonary nodule: Secondary | ICD-10-CM

## 2012-09-21 DIAGNOSIS — D751 Secondary polycythemia: Secondary | ICD-10-CM

## 2012-09-21 LAB — CBC WITH DIFFERENTIAL/PLATELET
Basophils Relative: 0 % (ref 0–1)
Eosinophils Absolute: 0.2 10*3/uL (ref 0.0–0.7)
Eosinophils Relative: 2 % (ref 0–5)
Lymphs Abs: 1.9 10*3/uL (ref 0.7–4.0)
MCH: 31 pg (ref 26.0–34.0)
MCHC: 33.9 g/dL (ref 30.0–36.0)
MCV: 91.5 fL (ref 78.0–100.0)
Neutrophils Relative %: 72 % (ref 43–77)
Platelets: 292 10*3/uL (ref 150–400)
RBC: 4.96 MIL/uL (ref 3.87–5.11)

## 2012-09-21 NOTE — Progress Notes (Signed)
Labs drawn today for cbc/diff 

## 2012-09-21 NOTE — Progress Notes (Signed)
Katelyn Cline presents today for phlebotomy per MD orders. Phlebotomy procedure started at 1005 and ended at 1044. 500 cc removed. Patient tolerated procedure well. IV needle removed intact.

## 2012-09-22 ENCOUNTER — Encounter: Payer: Self-pay | Admitting: Cardiothoracic Surgery

## 2012-09-22 ENCOUNTER — Ambulatory Visit (INDEPENDENT_AMBULATORY_CARE_PROVIDER_SITE_OTHER): Payer: Medicare HMO | Admitting: Cardiothoracic Surgery

## 2012-09-22 ENCOUNTER — Ambulatory Visit (HOSPITAL_COMMUNITY)
Admission: RE | Admit: 2012-09-22 | Discharge: 2012-09-22 | Disposition: A | Discharge status: Home / Self Care | Payer: Medicare HMO | Source: Ambulatory Visit | Attending: Cardiothoracic Surgery | Admitting: Cardiothoracic Surgery

## 2012-09-22 VITALS — BP 135/77 | HR 72 | Resp 18 | Ht 67.5 in | Wt 263.0 lb

## 2012-09-22 DIAGNOSIS — Z7189 Other specified counseling: Secondary | ICD-10-CM

## 2012-09-22 DIAGNOSIS — R0989 Other specified symptoms and signs involving the circulatory and respiratory systems: Secondary | ICD-10-CM | POA: Insufficient documentation

## 2012-09-22 DIAGNOSIS — F17200 Nicotine dependence, unspecified, uncomplicated: Secondary | ICD-10-CM | POA: Insufficient documentation

## 2012-09-22 DIAGNOSIS — R911 Solitary pulmonary nodule: Secondary | ICD-10-CM

## 2012-09-22 DIAGNOSIS — R942 Abnormal results of pulmonary function studies: Secondary | ICD-10-CM | POA: Insufficient documentation

## 2012-09-22 DIAGNOSIS — R0609 Other forms of dyspnea: Secondary | ICD-10-CM | POA: Insufficient documentation

## 2012-09-22 DIAGNOSIS — J984 Other disorders of lung: Secondary | ICD-10-CM

## 2012-09-22 DIAGNOSIS — R918 Other nonspecific abnormal finding of lung field: Secondary | ICD-10-CM | POA: Insufficient documentation

## 2012-09-22 LAB — BLOOD GAS, ARTERIAL
Acid-Base Excess: 4.3 mmol/L — ABNORMAL HIGH (ref 0.0–2.0)
Bicarbonate: 29.3 mEq/L — ABNORMAL HIGH (ref 20.0–24.0)
Drawn by: 24610
FIO2: 0.21 %
O2 Saturation: 94 %
Patient temperature: 98.6
TCO2: 25.4 mmol/L (ref 0–100)
pCO2 arterial: 46.9 mmHg — ABNORMAL HIGH (ref 35.0–45.0)
pH, Arterial: 7.413 (ref 7.350–7.450)
pO2, Arterial: 68.3 mmHg — ABNORMAL LOW (ref 80.0–100.0)

## 2012-09-22 LAB — PULMONARY FUNCTION TEST

## 2012-09-22 MED ORDER — ALBUTEROL SULFATE (5 MG/ML) 0.5% IN NEBU
2.5000 mg | INHALATION_SOLUTION | Freq: Once | RESPIRATORY_TRACT | Status: AC
  Administered 2012-09-22: 2.5 mg via RESPIRATORY_TRACT

## 2012-09-22 NOTE — Progress Notes (Signed)
[address]       [place] [address]             [phone]        [facility] Medical Record #[id number] Date of Birth: 1960-12-26  Referring: Randall An, MD Primary Care: [name], MD  Chief Complaint:    Chief Complaint  Patient presents with  . Lung Lesion    follow up after PFT'S    History of Present Illness:    Patient with long term history of smoking referred to oncology for evaluation of polycythemia. CT of chest and abdomen was done and a lung nodule was noted . PET scan done and patient referred to thoracic surgery for evaluation of lung nodule.Po2 noted to be 55 on abg. Patient has sob with activity.   Patient returns to the office today for further discussion on the evaluation of her lung nodule and also to evaluate her pulmonary function studies. She continues to avoid cigarettes. She has also undergone phlebotomy for her newly diagnosed polycythemia  Current Activity/ Functional Status:  Patient is independent with mobility/ambulation, transfers, ADL's, IADL's.  Zubrod Score: At the time of surgery this patient's most appropriate activity status/level should be described as: []  Normal activity, no symptoms [x]  Symptoms, fully ambulatory []  Symptoms, in bed less than or equal to 50% of the time []  Symptoms, in bed greater than 50% of the time but less than 100% []  Bedridden []  Moribund   Past Medical History  Diagnosis Date  . Narcolepsy   . HTN (hypertension)   . Fibromyalgia   . Scoliosis   . Migraines   . Major depression   . Panic attacks   . Agoraphobia   . Chronic pain   . Miscarriage     x 4  . Peripheral neuropathy   . Hyperlipidemia   . Basal cell carcinoma   . Elevated hemoglobin 2014  . Vertigo   . Polycythemia secondary to smoking   . Polycythemia vera     Past Surgical History  Procedure Laterality Date  . Vaginal hysterectomy    . Breast lumpectomy      bilateral  .  Cystectomy      abdominal wall   . Basal cell carcinoma excision      Family History  Problem Relation Age of Onset  . Arthritis Mother   . Hyperlipidemia Mother   . Depression Mother   . Anxiety disorder Mother   . Dementia Mother   . Hypertension Father   . Hyperlipidemia Father   . Heart disease Father   . Stroke Father   . Dementia Father   . Heart disease Brother   . Alcohol abuse Maternal Grandfather   . Bipolar disorder Neg Hx   . Drug abuse Neg Hx   . OCD Neg Hx   . Paranoid behavior Neg Hx   . Schizophrenia Neg Hx   . Seizures Neg Hx   . Sexual abuse Neg Hx   . Physical abuse Neg Hx   . ADD / ADHD Son       History  Smoking status  . Current Every Day Smoker -- 2.00 packs/day for 5 years  . Types: Cigarettes  Smokeless tobacco  . Not on file    Comment: has cut down to 2 cigs or less a day    History  Alcohol Use  . Yes    Comment: 1 -2 drinks  a month     Allergies  Allergen Reactions  . Contrast Media (Iodinated Diagnostic Agents) Anaphylaxis  . Flagyl (Metronidazole)   . Penicillins   . Tetracyclines & Related     Current Outpatient Prescriptions  Medication Sig Dispense Refill  . amitriptyline (ELAVIL) 25 MG tablet Take 1 tablet (25 mg total) by mouth at bedtime. For depression, anxiety, insomnia, and pain as well as fibromyalgia  30 tablet  1  . amLODipine (NORVASC) 5 MG tablet Take 1 tablet (5 mg total) by mouth daily.  30 tablet  3  . Aspirin-Acetaminophen-Caffeine (EXCEDRIN PO) Take by mouth.      Marland Kitchen atorvastatin (LIPITOR) 40 MG tablet Take 40 mg by mouth daily.       Marland Kitchen buPROPion (WELLBUTRIN SR) 200 MG 12 hr tablet Take 1 tablet (200 mg total) by mouth every morning.  30 tablet  1  . DULoxetine (CYMBALTA) 60 MG capsule Take 1 capsule (60 mg total) by mouth daily.  30 capsule  3  . gabapentin (NEURONTIN) 100 MG capsule Take by mouth two to three up to 5 times a day. FOR PAIN and ANXIETY  200 capsule  1  . ibuprofen (ADVIL,MOTRIN) 800 MG  tablet Take 1 tablet (800 mg total) by mouth every 8 (eight) hours as needed for pain.  60 tablet  2  . modafinil (PROVIGIL) 200 MG tablet One tab three times daily  90 tablet  0  . Omega-3 Fatty Acids (FISH OIL PO) Take by mouth.      . triamterene-hydrochlorothiazide (DYAZIDE) 37.5-25 MG per capsule Once daily  30 capsule  0   No current facility-administered medications for this visit.       Review of Systems:     Cardiac Review of Systems: Y or N  Chest Pain [ n   ]  Resting SOB Cove.Etienne   ] Exertional SOB  [ y ]  Pollyann Kennedy Milo.Brash  ]   Pedal Edema [ n  ]    Palpitations [ y ] Syncope  [n  ]   Presyncope [n   ]  General Review of Systems: [Y] = yes [  ]=no Constitional: recent weight change [ n ]; anorexia [  ]; fatigue [ y ]; nausea [  ]; night sweats [  ]; fever [  ]; or chills [  ];                                                                                                                                          Dental: poor dentition[ y ]; Last Dentist visit:   Eye : blurred vision [  ]; diplopia [   ]; vision changes [  ];  Amaurosis fugax[  ]; Resp: cough [  ];  wheezing[y  ];  hemoptysis[ n ]; shortness of breath[y  ]; paroxysmal nocturnal dyspnea[ y ]; dyspnea on exertion[y  ];  or orthopnea[y  ];  GI:  gallstones[  ], vomiting[  ];  dysphagia[  ]; melena[  ];  hematochezia [  ]; heartburn[  ];   Hx of  Colonoscopy[  ]; GU: kidney stones [  ]; hematuria[  ];   dysuria [ y ];  nocturia[  ];  history of     obstruction [  ]; urinary frequency [  ]             Skin: rash, swelling[  ];, hair loss[  ];  peripheral edema[  ];  or itching[  ]; Musculosketetal: myalgias[ y ];  joint swelling[ y ];  joint erythema[  ];  joint pain[  ];  back pain[ y ];  Heme/Lymph: bruising[  ];  bleeding[  ];  anemia[  ];  Neuro: TIA[  ];  headaches[  ];  stroke[  ];  vertigo[  ];  seizures[  ];   paresthesias[  ];  difficulty walking[  ];  Psych:depression[ y ]; anxiety[ y ];  Endocrine: diabetes[  ];   thyroid dysfunction[  ];  Immunizations: Flu [  ]; Pneumococcal[  ];  Other:  Physical Exam: BP 135/77  Pulse 72  Resp 18  Ht 5' 7.5" (1.715 m)  Wt 263 lb (119.296 kg)  BMI 40.56 kg/m2  SpO2 94%  General appearance: alert, cooperative, appears older than stated age, no distress and morbidly obese Neurologic: intact Heart: regular rate and rhythm, S1, S2 normal, no murmur, click, rub or gallop and normal apical impulse Lungs: diminished breath sounds bilaterally Abdomen: soft, non-tender; bowel sounds normal; no masses,  no organomegaly Extremities: extremities normal, atraumatic, no cyanosis or edema no carotid bruits, healing scar lrft nose fac from resection of basal cell CA palpable dt dp bilaterial  Diagnostic Studies & Laboratory data:     Recent Radiology Findings:  Ct Abdomen Pelvis Wo Contrast  08/25/2012  *RADIOLOGY REPORT*  Clinical Data:  Polycythemia.  Erythrocytosis.  Smoker long-term  CT CHEST, ABDOMEN AND PELVIS WITHOUT CONTRAST  Technique:  Multidetector CT imaging of the chest, abdomen and pelvis was performed following the standard protocol without IV contrast.  Comparison:   None.  CT CHEST  Findings:  There is no axillary or supraclavicular lymphadenopathy. No mediastinal or hilar lymphadenopathy.  5 mm right lower paratracheal lymph node is not pathologic by size criteria.  No pericardial fluid.  Coronary calcifications are present.  Review of the lung parenchyma demonstrates a solitary 12 mm nodule within the central right upper lobe (image 30).  Lesion does have low attenuation ( Hounsfield units less than zero but this is a small lesion and low density could reflect volume averaging). There is a fine reticular pattern with some nodularity in the right upper lobe  and right middle lobe which may represent bronchiolitis.  IMPRESSION:  1.  Solitary low attenuation right upper lobe pulmonary nodule is concerning for bronchogenic carcinoma versus hamartoma.  In patient  with longstanding smoking history,   recommend FDG PET CT scan to determine malignant potential. 2.  Mild reticular pattern in the upper lobe may relate to smoking history. 3.  No evidence of mediastinal lymphadenopathy. 4.  Coronary artery calcifications noted.  Non-IV contrast  CT ABDOMEN AND PELVIS  Findings:  Non-IV contrast images demonstrate no focal hepatic lesion.  Gallbladder, pancreas, spleen, adrenal glands are normal. The kidneys are normal.  The stomach, small bowel, appendix, and cecum are normal.  The colon and rectum are unremarkable.  Abdominal aorta  normal caliber.  No retroperitoneal periportal lymphadenopathy.  No free fluid the pelvis.  Hysterectomy anatomy.  The bladder is normal.  No pelvic lymphadenopathy. Review of  bone windows demonstrates no aggressive osseous lesions.  IMPRESSION: 1.  No evidence of adenopathy or malignancy in the abdomen or pelvis. 2.  Please see above chest section for solitary pulmonary nodule.   Original Report Authenticated By: Genevive Bi, M.D.    Ct Chest Wo Contrast  08/25/2012  *RADIOLOGY REPORT*  Clinical Data:  Polycythemia.  Erythrocytosis.  Smoker long-term  CT CHEST, ABDOMEN AND PELVIS WITHOUT CONTRAST  Technique:  Multidetector CT imaging of the chest, abdomen and pelvis was performed following the standard protocol without IV contrast.  Comparison:   None.  CT CHEST  Findings:  There is no axillary or supraclavicular lymphadenopathy. No mediastinal or hilar lymphadenopathy.  5 mm right lower paratracheal lymph node is not pathologic by size criteria.  No pericardial fluid.  Coronary calcifications are present.  Review of the lung parenchyma demonstrates a solitary 12 mm nodule within the central right upper lobe (image 30).  Lesion does have low attenuation ( Hounsfield units less than zero but this is a small lesion and low density could reflect volume averaging). There is a fine reticular pattern with some nodularity in the right upper lobe  and  right middle lobe which may represent bronchiolitis.  IMPRESSION:  1.  Solitary low attenuation right upper lobe pulmonary nodule is concerning for bronchogenic carcinoma versus hamartoma.  In patient with longstanding smoking history,   recommend FDG PET CT scan to determine malignant potential. 2.  Mild reticular pattern in the upper lobe may relate to smoking history. 3.  No evidence of mediastinal lymphadenopathy. 4.  Coronary artery calcifications noted.  Non-IV contrast  CT ABDOMEN AND PELVIS  Findings:  Non-IV contrast images demonstrate no focal hepatic lesion.  Gallbladder, pancreas, spleen, adrenal glands are normal. The kidneys are normal.  The stomach, small bowel, appendix, and cecum are normal.  The colon and rectum are unremarkable.  Abdominal aorta normal caliber.  No retroperitoneal periportal lymphadenopathy.  No free fluid the pelvis.  Hysterectomy anatomy.  The bladder is normal.  No pelvic lymphadenopathy. Review of  bone windows demonstrates no aggressive osseous lesions.  IMPRESSION: 1.  No evidence of adenopathy or malignancy in the abdomen or pelvis. 2.  Please see above chest section for solitary pulmonary nodule.   Original Report Authenticated By: Genevive Bi, M.D.    Mr Laqueta Jean Wo Contrast  09/19/2012  *RADIOLOGY REPORT*  Clinical Data: 52 year old female with lung nodule suspicious for bronchogenic carcinoma.  Headache.  Polycythemia.  MRI HEAD WITHOUT AND WITH CONTRAST  Technique:  Multiplanar, multiecho pulse sequences of the brain and surrounding structures were obtained according to standard protocol without and with intravenous contrast  Contrast: 20mL MULTIHANCE GADOBENATE DIMEGLUMINE 529 MG/ML IV SOLN  Comparison: PET CT 09/08/2012.  Findings: No abnormal enhancement identified.  No abnormal diffusion.  No areas of cerebral edema identified. No midline shift, mass effect, or evidence of mass lesion.  Cerebral volume is within normal limits for age.  No restricted diffusion  to suggest acute infarction.  No ventriculomegaly. No acute intracranial hemorrhage identified.  Negative pituitary, cervicomedullary junction visualized cervical spine. Major intracranial vascular flow voids are preserved.  Normal for age gray-white matter signal throughout the brain.  Normal bone marrow signal at the skull base.  No suspicious calvarial lesion identified. Visualized paranasal sinuses and mastoids are clear.  Negative scalp  soft tissues.  IMPRESSION: No acute or metastatic intracranial abnormality. Negative MRI appearance of the brain.   Original Report Authenticated By: Erskine Speed, M.D.    US Breast Right  09/07/2012  *RADIOLOGY REPORT*  Clinical Data:  Mass right breast identified on recent screening mammogram.  The patient states that prior to February 2014, her most recent mammogram was 10 years or greater ago.  DIGITAL DIAGNOSTIC BILATERAL MAMMOGRAM WITHOUT CAD AND RIGHT BREAST ULTRASOUND:  Comparison:  08/22/2012  Findings:  ACR Breast Density Category 1: The breast tissue is almost entirely fatty.  Focal spot compression views of the deep aspect of the 6 o'clock region of the right breast show a circumscribed oblong oval nodule, with a possible fatty hilum noted in the MLO projection.  Focal spot compression view of the left breast in the MLO projection shows no suspicious mass or distortion.  On physical exam, no mass is palpated in the 6 o'clock region of the right breast.  Ultrasound is performed, showing approximately 10 x 3 x 9 mm circumscribed oval mass with peripheral hypoechogenicity and central echogenicity, most consistent with a benign intramammary lymph node, at 6 o'clock position 5 cm from the nipple. Color flow is seen in the fatty hilum, and adjacent to the probable lymph node.  IMPRESSION: Probable intramammary lymph node right breast 6 o'clock position 5 cm from the nipple.  RECOMMENDATION: 39-month follow-up right breast ultrasound.  I have discussed the findings and  recommendations with the patient. Results were also provided in writing at the conclusion of the visit.  BI-RADS CATEGORY 3:  Probably benign finding(s) - short interval follow-up suggested.   Original Report Authenticated By: Britta Mccreedy, M.D.    Nm Pet Image Initial (pi) Skull Base To Thigh  09/08/2012  *RADIOLOGY REPORT*  Clinical Data: Initial treatment strategy for solitary right upper lobe pulmonary nodule.  NUCLEAR MEDICINE PET SKULL BASE TO THIGH  Fasting Blood Glucose:  148  Technique:  18.6 mCi F-18 FDG was injected intravenously. CT data was obtained and used for attenuation correction and anatomic localization only.  (This was not acquired as a diagnostic CT examination.) Additional exam technical data entered on technologist worksheet.  Comparison:  CT chest abdomen pelvis dated 08/25/2012  Findings:  Neck: No hypermetabolic lymph nodes in the neck.  Chest:  12 x 9 mm posterior right upper lobe pulmonary nodule (series 2/image 89) max SUV 3.0.  No hypermetabolic mediastinal or hilar nodes.  Suspected mild coronary atherosclerosis.  Atherosclerotic calcifications of the aortic arch.  Abdomen/Pelvis:  No abnormal hypermetabolic activity within the liver, pancreas, adrenal glands, or spleen.  Atherosclerotic calcifications of the abdominal aorta and branch vessels.  Colonic diverticulosis, without associated inflammatory changes.  Prior hysterectomy.  No hypermetabolic lymph nodes in the abdomen or pelvis.  Skeleton:  No focal hypermetabolic activity to suggest skeletal metastasis.  IMPRESSION: 12 mm posterior right upper lobe pulmonary nodule, max SUV 3.0, suspicious for primary bronchogenic neoplasm.  No evidence of metastasis.   Original Report Authenticated By: Charline Bills, M.D.    Mm Digital Diagnostic Bilat  09/07/2012  *RADIOLOGY REPORT*  Clinical Data:  Mass right breast identified on recent screening mammogram.  The patient states that prior to February 2014, her most recent mammogram  was 10 years or greater ago.  DIGITAL DIAGNOSTIC BILATERAL MAMMOGRAM WITHOUT CAD AND RIGHT BREAST ULTRASOUND:  Comparison:  08/22/2012  Findings:  ACR Breast Density Category 1: The breast tissue is almost entirely fatty.  Focal spot compression views  of the deep aspect of the 6 o'clock region of the right breast show a circumscribed oblong oval nodule, with a possible fatty hilum noted in the MLO projection.  Focal spot compression view of the left breast in the MLO projection shows no suspicious mass or distortion.  On physical exam, no mass is palpated in the 6 o'clock region of the right breast.  Ultrasound is performed, showing approximately 10 x 3 x 9 mm circumscribed oval mass with peripheral hypoechogenicity and central echogenicity, most consistent with a benign intramammary lymph node, at 6 o'clock position 5 cm from the nipple. Color flow is seen in the fatty hilum, and adjacent to the probable lymph node.  IMPRESSION: Probable intramammary lymph node right breast 6 o'clock position 5 cm from the nipple.  RECOMMENDATION: 100-month follow-up right breast ultrasound.  I have discussed the findings and recommendations with the patient. Results were also provided in writing at the conclusion of the visit.  BI-RADS CATEGORY 3:  Probably benign finding(s) - short interval follow-up suggested.   Original Report Authenticated By: Britta Mccreedy, M.D.    Mm Digital Screening  08/26/2012  *RADIOLOGY REPORT*  Clinical Data: Screening.  DIGITAL BILATERAL SCREENING MAMMOGRAM WITH CAD  Comparison:  None.  FINDINGS:  ACR Breast Density Category 2: There is a scattered fibroglandular pattern.  Possible masses are noted in both breasts.  Images were processed with CAD.  IMPRESSION: Further evaluation is suggested for possible masses in both breasts.  RECOMMENDATION: Diagnostic mammogram and possibly ultrasound of both breasts. (Code:FI-B-69M)  The patient will be contacted regarding the findings, and additional imaging  will be scheduled.  BI-RADS CATEGORY 0:  Incomplete.  Need additional imaging evaluation and/or prior mammograms for comparison.   Original Report Authenticated By: Beckie Salts, M.D.      Recent Lab Findings: Lab Results  Component Value Date   WBC 9.1 09/21/2012   HGB 15.4* 09/21/2012   HCT 45.4 09/21/2012   PLT 292 09/21/2012   GLUCOSE 194* 09/13/2012   CHOL 195 06/09/2012   TRIG 359* 06/09/2012   HDL 34* 06/09/2012   LDLCALC 89 06/09/2012   ALT 19 08/15/2012   AST 16 08/15/2012   NA 137 09/13/2012   K 3.9 09/13/2012   CL 98 09/13/2012   CREATININE 0.54 09/13/2012   BUN 15 09/13/2012   CO2 27 09/13/2012   TSH 3.474 06/09/2012   HGBA1C 6.2* 06/09/2012   PFT's FEV1 2.53  84 %, DLCO 17.34  61% Repeat abg"  ph 7.41 pC02 46  PO2 68   Assessment / Plan:      Right upper lobe lung nodule in long term smoker suspicious for Lung cancer ,12 mm posterior right upper lobe pulmonary nodule, max SUV 3.0, Multifactorial polycythemia, smoking  related/hypoxia and polycythemia vera, Jak-2 positive I discussed with the patient the radiographic findings and suspicion in a smoker of the lung nodule potential to be malignant, the SUV maximum is only 3.0 however.  Patient is not anxious to proceed immediately with surgery we will obtain a followup CT scan of the chest in 3 months which will be a proximate 4 months from the original CT scan.  This will give her time to be off cigarettes, have her polycythemia treated, and if we see any change in size of the lesion she will need treatment. I discussed with her the importance of the followup scan in deciding on treatment regimen.    Delight Ovens MD  Beeper [phone] Office [phone] 09/22/2012 5:15  PM        

## 2012-09-28 ENCOUNTER — Encounter (HOSPITAL_COMMUNITY): Payer: Medicare HMO | Attending: Oncology

## 2012-09-28 ENCOUNTER — Other Ambulatory Visit (HOSPITAL_COMMUNITY): Payer: Self-pay

## 2012-09-28 VITALS — BP 149/88 | HR 86

## 2012-09-28 DIAGNOSIS — R911 Solitary pulmonary nodule: Secondary | ICD-10-CM

## 2012-09-28 DIAGNOSIS — D751 Secondary polycythemia: Secondary | ICD-10-CM

## 2012-09-28 DIAGNOSIS — D45 Polycythemia vera: Secondary | ICD-10-CM | POA: Insufficient documentation

## 2012-09-28 NOTE — Progress Notes (Signed)
Ennifer Harston presents today for phlebotomy per MD orders. Phlebotomy procedure started at 1026 and ended at 1034. 500 cc removed. Patient tolerated procedure well. IV needle removed intact.

## 2012-09-28 NOTE — Addendum Note (Signed)
Addended by: Oda Kilts on: 09/28/2012 03:01 PM   Modules accepted: Orders

## 2012-10-05 ENCOUNTER — Encounter (HOSPITAL_BASED_OUTPATIENT_CLINIC_OR_DEPARTMENT_OTHER): Payer: Medicare HMO

## 2012-10-05 DIAGNOSIS — D751 Secondary polycythemia: Secondary | ICD-10-CM

## 2012-10-05 DIAGNOSIS — D45 Polycythemia vera: Secondary | ICD-10-CM

## 2012-10-05 DIAGNOSIS — R911 Solitary pulmonary nodule: Secondary | ICD-10-CM

## 2012-10-05 LAB — CBC WITH DIFFERENTIAL/PLATELET
Hemoglobin: 14.3 g/dL (ref 12.0–15.0)
Lymphocytes Relative: 22 % (ref 12–46)
Lymphs Abs: 2.2 10*3/uL (ref 0.7–4.0)
Monocytes Relative: 5 % (ref 3–12)
Neutro Abs: 7 10*3/uL (ref 1.7–7.7)
Neutrophils Relative %: 70 % (ref 43–77)
Platelets: 303 10*3/uL (ref 150–400)
RBC: 4.54 MIL/uL (ref 3.87–5.11)
WBC: 10 10*3/uL (ref 4.0–10.5)

## 2012-10-05 NOTE — Progress Notes (Signed)
Labs drawn today for cbc/diff,vd25

## 2012-10-06 LAB — VITAMIN D 25 HYDROXY (VIT D DEFICIENCY, FRACTURES): Vit D, 25-Hydroxy: 15 ng/mL — ABNORMAL LOW (ref 30–89)

## 2012-10-07 ENCOUNTER — Other Ambulatory Visit (HOSPITAL_COMMUNITY): Payer: Self-pay | Admitting: *Deleted

## 2012-10-07 ENCOUNTER — Telehealth: Payer: Self-pay | Admitting: Family Medicine

## 2012-10-07 DIAGNOSIS — E559 Vitamin D deficiency, unspecified: Secondary | ICD-10-CM

## 2012-10-07 MED ORDER — ATORVASTATIN CALCIUM 40 MG PO TABS: 40.0000 mg | ORAL_TABLET | Freq: Every day | ORAL | Status: DC

## 2012-10-07 NOTE — Telephone Encounter (Signed)
Med sent.

## 2012-10-12 ENCOUNTER — Encounter (HOSPITAL_BASED_OUTPATIENT_CLINIC_OR_DEPARTMENT_OTHER): Payer: Medicare HMO

## 2012-10-12 DIAGNOSIS — R911 Solitary pulmonary nodule: Secondary | ICD-10-CM

## 2012-10-12 DIAGNOSIS — D751 Secondary polycythemia: Secondary | ICD-10-CM

## 2012-10-12 LAB — CBC WITH DIFFERENTIAL/PLATELET
Basophils Relative: 0 % (ref 0–1)
Eosinophils Absolute: 0.3 10*3/uL (ref 0.0–0.7)
Eosinophils Relative: 3 % (ref 0–5)
HCT: 43.6 % (ref 36.0–46.0)
Hemoglobin: 14.8 g/dL (ref 12.0–15.0)
Neutro Abs: 7.1 10*3/uL (ref 1.7–7.7)
RBC: 4.79 MIL/uL (ref 3.87–5.11)
RDW: 14.4 % (ref 11.5–15.5)
WBC: 10.3 10*3/uL (ref 4.0–10.5)

## 2012-10-12 NOTE — Progress Notes (Signed)
Labs drawn today for cbc/diff 

## 2012-10-18 ENCOUNTER — Encounter: Payer: Self-pay | Admitting: Cardiothoracic Surgery

## 2012-10-18 ENCOUNTER — Ambulatory Visit (INDEPENDENT_AMBULATORY_CARE_PROVIDER_SITE_OTHER): Payer: Medicare HMO | Admitting: Cardiothoracic Surgery

## 2012-10-18 ENCOUNTER — Other Ambulatory Visit: Payer: Self-pay | Admitting: *Deleted

## 2012-10-18 VITALS — BP 140/72 | HR 77 | Resp 20 | Ht 67.5 in | Wt 263.0 lb

## 2012-10-18 DIAGNOSIS — C799 Secondary malignant neoplasm of unspecified site: Secondary | ICD-10-CM

## 2012-10-18 DIAGNOSIS — C439 Malignant melanoma of skin, unspecified: Secondary | ICD-10-CM

## 2012-10-18 DIAGNOSIS — R911 Solitary pulmonary nodule: Secondary | ICD-10-CM

## 2012-10-18 DIAGNOSIS — Z85820 Personal history of malignant melanoma of skin: Secondary | ICD-10-CM | POA: Insufficient documentation

## 2012-10-18 DIAGNOSIS — J984 Other disorders of lung: Secondary | ICD-10-CM

## 2012-10-18 HISTORY — DX: Secondary malignant neoplasm of unspecified site: C79.9

## 2012-10-18 HISTORY — DX: Malignant melanoma of skin, unspecified: C43.9

## 2012-10-18 NOTE — Progress Notes (Signed)
[address]       [place] [address]             [phone]       Katelyn Cline [facility] Medical Record #[id number] Date of Birth: 16-Mar-1973  Referring: Dr Mariel Sleet Primary Care: [name], MD  Chief Complaint:    Chief Complaint  Patient presents with  . Lung Lesion    Further discuss surgery    History of Present Illness:    Patient with long term history of smoking referred to oncology for evaluation of polycythemia. CT of chest and abdomen was done and a lung nodule was noted . PET scan done and patient referred to thoracic surgery for evaluation of lung nodule.Po2 noted to be 55 on abg. Patient has sob with activity.   Patient returns to the office today for further discussion on the evaluation of her lung nodule and also to evaluate her pulmonary function studies. She continues to avoid cigarettes. She has also undergone phlebotomy for her newly diagnosed polycythemia. HCT to 41% now  Current Activity/ Functional Status:  Patient is independent with mobility/ambulation, transfers, ADL's, IADL's.  Zubrod Score: At the time of surgery this patient's most appropriate activity status/level should be described as: []  Normal activity, no symptoms [x]  Symptoms, fully ambulatory []  Symptoms, in bed less than or equal to 50% of the time []  Symptoms, in bed greater than 50% of the time but less than 100% []  Bedridden []  Moribund   Past Medical History  Diagnosis Date  . Narcolepsy   . HTN (hypertension)   . Fibromyalgia   . Scoliosis   . Migraines   . Major depression   . Panic attacks   . Agoraphobia   . Chronic pain   . Miscarriage     x 4  . Peripheral neuropathy   . Hyperlipidemia   . Basal cell carcinoma   . Elevated hemoglobin 2014  . Vertigo   . Polycythemia secondary to smoking   . Polycythemia vera     Past Surgical History  Procedure Laterality Date  . Vaginal hysterectomy    . Breast lumpectomy     bilateral  . Cystectomy      abdominal wall   . Basal cell carcinoma excision      Family History  Problem Relation Age of Onset  . Arthritis Mother   . Hyperlipidemia Mother   . Depression Mother   . Anxiety disorder Mother   . Dementia Mother   . Hypertension Father   . Hyperlipidemia Father   . Heart disease Father   . Stroke Father   . Dementia Father   . Heart disease Brother   . Alcohol abuse Maternal Grandfather   . Bipolar disorder Neg Hx   . Drug abuse Neg Hx   . OCD Neg Hx   . Paranoid behavior Neg Hx   . Schizophrenia Neg Hx   . Seizures Neg Hx   . Sexual abuse Neg Hx   . Physical abuse Neg Hx   . ADD / ADHD Son       History  Smoking status  . Former Smoker -- 2.00 packs/day for 5 years  . Types: Cigarettes  . Quit date: 09/16/2012  Smokeless tobacco  . Not on file    History  Alcohol Use No    Comment: 1 -2 drinks a month     Allergies  Allergen Reactions  . Contrast  Media (Iodinated Diagnostic Agents) Anaphylaxis  . Flagyl (Metronidazole)   . Penicillins   . Tetracyclines & Related     Current Outpatient Prescriptions  Medication Sig Dispense Refill  . amitriptyline (ELAVIL) 25 MG tablet Take 25 mg by mouth at bedtime.      Marland Kitchen amLODipine (NORVASC) 5 MG tablet Take 5 mg by mouth daily.      Marland Kitchen aspirin-acetaminophen-caffeine (EXCEDRIN MIGRAINE) 250-250-65 MG per tablet Take 1 tablet by mouth every 6 (six) hours as needed for pain.      Marland Kitchen atorvastatin (LIPITOR) 40 MG tablet Take 40 mg by mouth daily.      Marland Kitchen buPROPion (WELLBUTRIN SR) 200 MG 12 hr tablet Take 200 mg by mouth 2 (two) times daily.      . DULoxetine (CYMBALTA) 60 MG capsule Take 60 mg by mouth daily.      Marland Kitchen gabapentin (NEURONTIN) 100 MG capsule Take 100 mg by mouth 4 (four) times daily.      Marland Kitchen ibuprofen (ADVIL,MOTRIN) 800 MG tablet Take 800 mg by mouth every 8 (eight) hours as needed for pain.      . modafinil (PROVIGIL) 200 MG tablet Take 200 mg by mouth daily.      Marland Kitchen  triamterene-hydrochlorothiazide (DYAZIDE) 37.5-25 MG per capsule Take 1 capsule by mouth every morning.       No current facility-administered medications for this visit.       Review of Systems:     Cardiac Review of Systems: Y or N  Chest Pain [ n   ]  Resting SOB Cove.Etienne   ] Exertional SOB  [ y ]  Pollyann Kennedy Milo.Brash  ]   Pedal Edema [ n  ]    Palpitations [ y ] Syncope  [n  ]   Presyncope [n   ]  General Review of Systems: [Y] = yes [  ]=no Constitional: recent weight change [ n ]; anorexia [  ]; fatigue [ y ]; nausea [  ]; night sweats [  ]; fever [  ]; or chills [  ];                                                                                                                                          Dental: poor dentition[ y ]; Last Dentist visit:   Eye : blurred vision [  ]; diplopia [   ]; vision changes [  ];  Amaurosis fugax[  ]; Resp: cough [  ];  wheezing[y  ];  hemoptysis[ n ]; shortness of breath[y  ]; paroxysmal nocturnal dyspnea[ y ]; dyspnea on exertion[y  ]; or orthopnea[y  ];  GI:  gallstones[  ], vomiting[  ];  dysphagia[  ]; melena[  ];  hematochezia [  ]; heartburn[  ];   Hx of  Colonoscopy[  ]; GU: kidney stones [  ]; hematuria[  ];  dysuria [ y ];  nocturia[  ];  history of     obstruction [  ]; urinary frequency [  ]             Skin: rash, swelling[  ];, hair loss[  ];  peripheral edema[  ];  or itching[  ]; Musculosketetal: myalgias[ y ];  joint swelling[ y ];  joint erythema[  ];  joint pain[  ];  back pain[ y ];  Heme/Lymph: bruising[  ];  bleeding[  ];  anemia[  ];  Neuro: TIA[  ];  headaches[  ];  stroke[  ];  vertigo[  ];  seizures[  ];   paresthesias[  ];  difficulty walking[  ];  Psych:depression[ y ]; anxiety[ y ];  Endocrine: diabetes[  ];  thyroid dysfunction[  ];  Immunizations: Flu [  ]; Pneumococcal[  ];  Other:  Physical Exam: BP 140/72  Pulse 77  Resp 20  Ht 5' 7.5" (1.715 m)  Wt 263 lb (119.296 kg)  BMI 40.56 kg/m2  SpO2 93%  General  appearance: alert, cooperative, appears older than stated age, no distress and morbidly obese Neurologic: intact Heart: regular rate and rhythm, S1, S2 normal, no murmur, click, rub or gallop and normal apical impulse Lungs: diminished breath sounds bilaterally Abdomen: soft, non-tender; bowel sounds normal; no masses,  no organomegaly Extremities: extremities normal, atraumatic, no cyanosis or edema no carotid bruits, healing scar lrft nose fac from resection of basal cell CA palpable dt dp bilaterial  Diagnostic Studies & Laboratory data:     Recent Radiology Findings:  Ct Abdomen Pelvis Wo Contrast  08/25/2012  *RADIOLOGY REPORT*  Clinical Data:  Polycythemia.  Erythrocytosis.  Smoker long-term  CT CHEST, ABDOMEN AND PELVIS WITHOUT CONTRAST  Technique:  Multidetector CT imaging of the chest, abdomen and pelvis was performed following the standard protocol without IV contrast.  Comparison:   None.  CT CHEST  Findings:  There is no axillary or supraclavicular lymphadenopathy. No mediastinal or hilar lymphadenopathy.  5 mm right lower paratracheal lymph node is not pathologic by size criteria.  No pericardial fluid.  Coronary calcifications are present.  Review of the lung parenchyma demonstrates a solitary 12 mm nodule within the central right upper lobe (image 30).  Lesion does have low attenuation ( Hounsfield units less than zero but this is a small lesion and low density could reflect volume averaging). There is a fine reticular pattern with some nodularity in the right upper lobe  and right middle lobe which may represent bronchiolitis.  IMPRESSION:  1.  Solitary low attenuation right upper lobe pulmonary nodule is concerning for bronchogenic carcinoma versus hamartoma.  In patient with longstanding smoking history,   recommend FDG PET CT scan to determine malignant potential. 2.  Mild reticular pattern in the upper lobe may relate to smoking history. 3.  No evidence of mediastinal  lymphadenopathy. 4.  Coronary artery calcifications noted.  Non-IV contrast  CT ABDOMEN AND PELVIS  Findings:  Non-IV contrast images demonstrate no focal hepatic lesion.  Gallbladder, pancreas, spleen, adrenal glands are normal. The kidneys are normal.  The stomach, small bowel, appendix, and cecum are normal.  The colon and rectum are unremarkable.  Abdominal aorta normal caliber.  No retroperitoneal periportal lymphadenopathy.  No free fluid the pelvis.  Hysterectomy anatomy.  The bladder is normal.  No pelvic lymphadenopathy. Review of  bone windows demonstrates no aggressive osseous lesions.  IMPRESSION: 1.  No evidence of adenopathy or malignancy in the  abdomen or pelvis. 2.  Please see above chest section for solitary pulmonary nodule.   Original Report Authenticated By: Genevive Bi, M.D.      Mr Laqueta Jean Wo Contrast  09/19/2012  *RADIOLOGY REPORT*  Clinical Data: 52 year old female with lung nodule suspicious for bronchogenic carcinoma.  Headache.  Polycythemia.  MRI HEAD WITHOUT AND WITH CONTRAST  Technique:  Multiplanar, multiecho pulse sequences of the brain and surrounding structures were obtained according to standard protocol without and with intravenous contrast  Contrast: 20mL MULTIHANCE GADOBENATE DIMEGLUMINE 529 MG/ML IV SOLN  Comparison: PET CT 09/08/2012.  Findings: No abnormal enhancement identified.  No abnormal diffusion.  No areas of cerebral edema identified. No midline shift, mass effect, or evidence of mass lesion.  Cerebral volume is within normal limits for age.  No restricted diffusion to suggest acute infarction.  No ventriculomegaly. No acute intracranial hemorrhage identified.  Negative pituitary, cervicomedullary junction visualized cervical spine. Major intracranial vascular flow voids are preserved.  Normal for age gray-white matter signal throughout the brain.  Normal bone marrow signal at the skull base.  No suspicious calvarial lesion identified. Visualized paranasal  sinuses and mastoids are clear.  Negative scalp soft tissues.  IMPRESSION: No acute or metastatic intracranial abnormality. Negative MRI appearance of the brain.   Original Report Authenticated By: Erskine Speed, M.D.    US Breast Right  Nm Pet Image Initial (pi) Skull Base To Thigh  09/08/2012  *RADIOLOGY REPORT*  Clinical Data: Initial treatment strategy for solitary right upper lobe pulmonary nodule.  NUCLEAR MEDICINE PET SKULL BASE TO THIGH  Fasting Blood Glucose:  148  Technique:  18.6 mCi F-18 FDG was injected intravenously. CT data was obtained and used for attenuation correction and anatomic localization only.  (This was not acquired as a diagnostic CT examination.) Additional exam technical data entered on technologist worksheet.  Comparison:  CT chest abdomen pelvis dated 08/25/2012  Findings:  Neck: No hypermetabolic lymph nodes in the neck.  Chest:  12 x 9 mm posterior right upper lobe pulmonary nodule (series 2/image 89) max SUV 3.0.  No hypermetabolic mediastinal or hilar nodes.  Suspected mild coronary atherosclerosis.  Atherosclerotic calcifications of the aortic arch.  Abdomen/Pelvis:  No abnormal hypermetabolic activity within the liver, pancreas, adrenal glands, or spleen.  Atherosclerotic calcifications of the abdominal aorta and branch vessels.  Colonic diverticulosis, without associated inflammatory changes.  Prior hysterectomy.  No hypermetabolic lymph nodes in the abdomen or pelvis.  Skeleton:  No focal hypermetabolic activity to suggest skeletal metastasis.  IMPRESSION: 12 mm posterior right upper lobe pulmonary nodule, max SUV 3.0, suspicious for primary bronchogenic neoplasm.  No evidence of metastasis.   Original Report Authenticated By: Charline Bills, M.D.    Mm Digital Diagnostic Bilat  09/07/2012  *RADIOLOGY REPORT*  Clinical Data:  Mass right breast identified on recent screening mammogram.  The patient states that prior to February 2014, her most recent mammogram was 10  years or greater ago.  DIGITAL DIAGNOSTIC BILATERAL MAMMOGRAM WITHOUT CAD AND RIGHT BREAST ULTRASOUND:  Comparison:  08/22/2012  Findings:  ACR Breast Density Category 1: The breast tissue is almost entirely fatty.  Focal spot compression views of the deep aspect of the 6 o'clock region of the right breast show a circumscribed oblong oval nodule, with a possible fatty hilum noted in the MLO projection.  Focal spot compression view of the left breast in the MLO projection shows no suspicious mass or distortion.  On physical exam, no mass is palpated in  the 6 o'clock region of the right breast.  Ultrasound is performed, showing approximately 10 x 3 x 9 mm circumscribed oval mass with peripheral hypoechogenicity and central echogenicity, most consistent with a benign intramammary lymph node, at 6 o'clock position 5 cm from the nipple. Color flow is seen in the fatty hilum, and adjacent to the probable lymph node.  IMPRESSION: Probable intramammary lymph node right breast 6 o'clock position 5 cm from the nipple.  RECOMMENDATION: 54-month follow-up right breast ultrasound.  I have discussed the findings and recommendations with the patient. Results were also provided in writing at the conclusion of the visit.  BI-RADS CATEGORY 3:  Probably benign finding(s) - short interval follow-up suggested.   Original Report Authenticated By: Britta Mccreedy, M.D.    Mm Digital Screening  08/26/2012  *RADIOLOGY REPORT*  Clinical Data: Screening.  DIGITAL BILATERAL SCREENING MAMMOGRAM WITH CAD  Comparison:  None.  FINDINGS:  ACR Breast Density Category 2: There is a scattered fibroglandular pattern.  Possible masses are noted in both breasts.  Images were processed with CAD.  IMPRESSION: Further evaluation is suggested for possible masses in both breasts.  RECOMMENDATION: Diagnostic mammogram and possibly ultrasound of both breasts. (Code:[id number]-[id number])  The patient will be contacted regarding the findings, and additional imaging will be  scheduled.  BI-RADS CATEGORY 0:  Incomplete.  Need additional imaging evaluation and/or prior mammograms for comparison.   Original Report Authenticated By: Beckie Salts, M.D.      Recent Lab Findings: Lab Results  Component Value Date   WBC 10.3 10/12/2012   HGB 14.8 10/12/2012   HCT 43.6 10/12/2012   PLT 294 10/12/2012   GLUCOSE 194* 09/13/2012   CHOL 195 06/09/2012   TRIG 359* 06/09/2012   HDL 34* 06/09/2012   LDLCALC 89 06/09/2012   ALT 19 08/15/2012   AST 16 08/15/2012   NA 137 09/13/2012   K 3.9 09/13/2012   CL 98 09/13/2012   CREATININE 0.54 09/13/2012   BUN 15 09/13/2012   CO2 27 09/13/2012   TSH 3.474 06/09/2012   HGBA1C 6.2* 06/09/2012   PFT's FEV1 2.53  84 %, DLCO 17.34  61% Repeat abg"  ph 7.41 pC02 46  PO2 68   Assessment / Plan:      Right upper lobe lung nodule in long term smoker suspicious for Lung cancer ,12 mm posterior right upper lobe pulmonary nodule, max SUV 3.0, Multifactorial polycythemia, smoking  related/hypoxia and polycythemia vera, Jak-2 positive I discussed with the patient the radiographic findings and suspicion in a smoker of the lung nodule potential to be malignant, the patient returns today because after further consideration and discussion with oncology she decided to proceed with surgical resection. The risks and options of surgery were discussed with her again in detail. She has not been smoking for one month. I reviewed with her the recommended procedure of bronchoscopy right video-assisted thoracoscopy probable wedge resection and possible completion lobectomy depending on the pathologic findings and possible lymph node dissection. Her questions have been answered and we'll tentatively plan to proceed Friday, April 25.    Delight Ovens MD  Beeper [phone] Office [phone] 10/18/2012 3:43 PM

## 2012-10-18 NOTE — Patient Instructions (Addendum)
No advil, no motrin No ASA Nothing by mouth after midnight the night before sugery    Pulmonary Nodule A pulmonary (lung) nodule is small, round growth in the lung. The size of a pulmonary nodule can be as small as a pencil eraser (1/5 inch or 4 millmeters) to a little bigger than your biggest toenail (1 inch or 25 millimeters). A pulmonary nodule is usually an unplanned finding. It may be found on a chest X-ray or a computed tomography (CT) scan when you have imaging tests of your lungs done. When a pulmonary nodule is found, tests will be done to determine if the nodule is benign (not cancerous) or malignant (cancerous). Follow-up treatment or testing is based on the size of the pulmonary nodule and your risk of getting lung cancer.  CAUSES Causes of pulmonary nodules can vary.  Benign pulmonary nodules  can be caused from different things. Some of these things include:  Infection. This can be a common cause of a benign pulmonary nodule. The infection may be active (a current infection) or an old infection that is no longer active. Three types of infections can cause a pulmonary nodule. These are:  Bacterial Infection.  Fungal infection.  Viral Infections.  Hematoma. This is a bruise in the lung. A hematoma can happen from an injury to your chest.  Some common diseases can lead to benign pulmonary nodules. For example, rheumatoid arthritis can be a cause of a pulmonary nodule.  Other unusual things can cause a benign pulmonary nodule. These can include:  Having had tuberculosis.  Rare diseases, such as a lung cyst. Malignant pulmonary nodules.  These are cancerous growths. The cancer may have:  Started in the lung. Some lung cancers first detected as a pulmonary nodule.  Spread to the lung from cancer somewhere else in the body. This is called metastatic cancer.  Certain risk factors make a cancerous pulmonary nodule more likely. They include:  Age. As people get older, a  pulmonary nodule is more likely to be cancerous.  Cancer history. If one of your immediate family members has had cancer, you have a higher risk of developing cancer.  Smoking. This includes people who currently smoke and those who have quit. DIAGNOSIS To diagnose whether a pulmonary nodule is benign or malignant, a variety of tests will be done. This includes things such as:  Health history. Questions regarding your current health, past health, and family health will be asked.  Blood tests. Results of blood work can show:  Tumor markers for cancer.  Any type of infection.  A skin test called a tuberculin (TB) test may be done. This test can tell if you have been exposed to the germ that causes tuberculosis.  Imaging tests. These take pictures of your lungs. Types of imaging tests include:  Chest X-ray. This can help in several ways. An X-ray gives a close-up look at the pulmonary nodule. A new X-ray can be compared with any X-rays you have had in the past.   Computed tomography  (CT) scan. This test shows smaller pulmonary nodules more clearly than an X-ray.  Positron emission tomography  (PET) scan. This is a test that uses a radioactive substance to identify a pulmonary nodule. A safe amount of radioactive substance is injected into the blood stream. Then, the scan takes a picture of the pulmonary nodule. A malignant pulmonary nodule will absorb the substance faster than a benign pulmonary nodule. The radioactive substance is eliminated from your body in your  urine.  Biopsy.  This removes a tiny piece of the pulmonary nodule so it can be checked under a microscope. Medicine will be given to help keep you relaxed and pain free when a biopsy is done. Types of biopsies include:  Bronchoscopy . This is a surgical procedure. It can be used for pulmonary nodules that are close to the airways in the lung. It uses a scope (a thin tube) with a tiny camera and light on the end. The scope is  put in the windpipe. Your caregiver can then see inside the lung. A tiny tool put through the scope is used to take a small sample of the pulmonary nodule tissue.  Transthoracic needle aspiration . This method is used if the pulmonary nodule is far away from the air passages in the lung. A long, thin needle is put through the chest into the lung nodule. A CT scan is done at the same time which can make it easier to locate the pulmonary nodule.  Surgical lung biopsy . This is a surgical procedure in which the pulmonary nodule is removed. This is usually recommended when the pulmonary nodule is most likely malignant or a biopsy cannot be obtained by either bronchoscopy or transthoracic needle aspiration. PULMONARY NODULE FOLLOW-UP RECOMMENDATIONS The frequency of pulmonary nodule follow-up is based on your risk factors and size of the pulmonary nodule. If your caregiver suspects the pulmonary nodule is cancerous or the pulmonary nodule changes during any of the follow-up CT scans, additional testing or biopsies will be done.   If you have no or low risk of getting lung cancer (non-smoker, no personal cancer history), recommended follow-up is based on the following pulmonary nodule size:  A pulmonary nodule that is < 4 mm does not require any follow-up.  A pulmonary nodule that is 4 to 6 mm should be re-imaged by CT scan in 12 months.  A pulmonary nodule that is 6 to 8 mm should be re-imaged by CT scan at 6 to 12 months and then again at 18 to 24 months if no change in size.  A pulmonary nodule > 8 mm in size should be followed closely and re-imaged by CT scan at 3, 9, and 24 months.   If you are at risk of getting lung cancer (current or former smoker, family history of cancer), recommended follow-up is based on the following pulmonary nodule size:  A pulmonary nodule that is < 4 mm in size should be re-imaged by CT scan in 12 months.  A pulmonary nodule that is 4 to 6 mm in size should be  re-imaged by CT scan at 6 to 12 months and again at 18 to 24 months.  A pulmonary nodule that is 6 to 8 mm in size should be re-imaged by CT scan at 3, 9, and 24 months.  A pulmonary nodule > 8 mm in size should be followed closely and re-imaged by CT scan at 3, 9, and 24 months. SEEK MEDICAL CARE IF: While waiting for test results to determine what type of pulmonary nodule you have, be sure to contact your caregiver if you:  Have trouble breathing when you are active.  Feel sick or unusually tired.  Do not feel like eating.  Lose weight without trying to.  Develop chills or night sweats.  Mild or moderate fevers generally have no long-term effects and often do not require treatment. There are a few exceptions (see below). SEEK IMMEDIATE MEDICAL CARE IF:  You cannot  catch your breath or you begin wheezing.  You cannot stop coughing.  You cough up blood.  You feel like you are going to pass out or become dizzy.  You have sudden chest pain.  You have a fever or persistent symptoms for more than 72 hours.  You have a fever and your symptoms suddenly get worse. MAKE SURE YOU   Understand these instructions.  Will watch your condition.  Will get help right away if you are not doing well or get worse. Document Released: 04/12/2009 Document Revised: 09/07/2011 Document Reviewed: 04/12/2009 [facility] Patient Information 2013 [place], [place]. Lung Resection A lung resection is surgery to remove a lung. When an entire lung is removed, the procedure is called a pneumonectomy. When only part of a lung is removed, the procedure is called a lobectomy. A lung resection is typically done to get rid of a tumor or cancer. This surgery can help relieve some or all of your symptoms. The surgery can also help keep the problem from getting worse. It may provide the best chance for curing your disease. However, surgery may not necessarily cure lung cancer, if that is the problem. Most people need  to stay in the hospital for several days after this procedure.  LET YOUR CAREGIVER KNOW ABOUT:  Allergies to food or medicine.  Medicines taken, including vitamins, herbs, eyedrops, over-the-counter medicines, and creams.  Use of steroids (by mouth or creams).  Previous problems with anesthetics or numbing medicines.  History of bleeding problems or blood clots.  Previous surgery.  Other health problems, including diabetes and kidney problems.  Possibility of pregnancy, if this applies. RISKS AND COMPLICATIONS  Lung resections have been done for many years with good results and few complications. However, all surgery is associated with possible risks. Some of these risks are:  Excessive bleeding.  Infection.  Inability to breath without a ventilator.  Persistent shortness of breath.  Heart problems, including abnormal rhythms and a risk of heart attack or heart failure.  Blood clots.  Injury to a blood vessel.  Injury to a nerve.  Failure to heal properly.  Stroke.  Bronchopleural fistula. This is a small hole between one of the main breathing tubes and the lining of the lungs. BEFORE THE PROCEDURE  In order to prepare for surgery, your caregiver may ask for several tests to be done. These may include:  Blood tests.  Urine tests.  X-rays.  Imaging tests, such as CT scans, MRI scans, and PET scans. These tests are done to find the exact size and location of the tumor that will be removed.  Pulmonary function tests (PFTs). These are breathing tests to assess the function of your lungs before surgery and to decide how to best help your breathing after surgery.  Heart testing. This is done to make sure your heart is strong enough for the procedure.  Bronchoscopy. This is a technique that allows your caregiver to look at the inside of your airways. This is done using a soft, flexible tube (bronchoscope). Along with imaging tests, this can help your caregiver know  the exact location and size of the area that will be removed during surgery.  Lymph node sampling. This may need to be done to see if the tumor has spread. It may be done as a separate surgery or right before your lung resection procedure. PROCEDURE  An intravenous line (IV) will be placed in your arm. You will be given medicine that makes you sleep (general anesthetic).  Once you are asleep, a breathing tube is placed into your windpipe. You may also get pain medicine through a thin, flexible tube (catheter) in your back. The catheter is put through your skin and next to your spinal cord, where it releases anesthetic medicine.  Next, you will be turned onto your side. This makes it easier for your surgeon to reach the area of your ribcage where the surgical cut (incision) will be made. This area is washed with a disinfectant solution and might also be shaved. A catheter will be put into your bladder to collect urine. Another tube will be carefully passed through your throat and into your stomach.  The surgeon will make an incision on your side, which will start between two of your ribs and go around to your back. Your ribs will be spread and held open. Part of one rib may be removed to make it easier for the surgeon to reach your lung.  Your surgeon will carefully cut the veins, arteries, and bronchus leading to the lung. After being cut, each of these pieces will be sewn or stapled closed. Then, the lung or part of the lung will be removed.  Your surgeon will check inside your chest to make sure there is no bleeding in or around the lungs. Lymph nodes near the lung may also be removed for later tests. This is done to check if your problems have spread to the lymph nodes.  Depending on your situation, your surgeon may put tubes into your chest to drain extra fluid and air from the chest cavity after surgery. After the tubes are in, your ribcage will be closed with stitches. The stitches help your  ribcage heal and keep it from moving. After this, the layers of tissue under the skin are closed with more stitches, which will dissolve inside your body over time. Finally, your skin is closed with stitches or staples and covered with a bandage. AFTER THE PROCEDURE   After surgery, you will be taken to the recovery area where a nurse will monitor your progress. You may still have a breathing tube, spinal catheter, bladder catheter, stomach tube, and possibly chest tubes inside your body. These will be removed during your recovery. You may be put on a respirator following surgery if some assistance is needed to help your breathing. When you are awake, stable, and without complications, you will likely continue recovery in the intensive care unit (ICU).  As you wake up, you might feel some aches and pains in your chest and throat. Sometimes during recovery, patients may shiver or feel nauseous. Both of these symptoms are temporary and may be caused by the anesthesia. Your caregivers can give you medicine to help these problems go away.  The breathing tube will be taken out as soon as your caregivers feel you can breathe on your own. For most people, this happens on the same day as the surgery.  If your surgery and time in the ICU go well, most of the tubes and equipment will be taken out within the first 1 to 2 days after surgery. This is about how long most people stay in the ICU. You may need to stay longer, depending on how you are doing.  You should also start respiratory therapy in the ICU. This therapy uses breathing exercises to help your other lung stay healthy and get stronger.  As you improve, you will be moved to a regular hospital room for continued respiratory therapy, help with your  bladder and bowels, and to continue medicines. Most people stay in the hospital for 5 to 7 days. However, your stay may be longer, depending on how your surgery went and how well you are doing.  After your  lung or part of your lung is taken out, there will be a space inside your chest. This space will often fill up with fluid over time. The amount of time this takes is different for each person. Because your chest needs to fill with fluid, your surgeon may or may not put a drainage tube in your chest. If there is a chest tube, it will most likely be removed within 24 hours after the surgery.  You will receive care until you are doing well and your caregiver feels it is safe for you to go home or to transfer to an extended care facility. Document Released: 09/05/2002 Document Revised: 09/07/2011 Document Reviewed: 02/12/2011 [facility] Patient Information 2013 [place], [place]. Lung Resection Care After Refer to this sheet in the next few weeks. These instructions provide you with information on caring for yourself after your procedure. Your caregiver may also give you more specific instructions. Your treatment has been planned according to current medical practices, but problems sometimes occur. Call your caregiver if you have any problems or questions after your procedure. HOME CARE INSTRUCTIONS  You may resume a normal diet and activities as directed.  Do not smoke or use tobacco products.  Change your bandages (dressings) as directed.  Only take over-the-counter or prescription medicines for pain, discomfort, or fever as directed by your caregiver.  Keep all follow-up appointments as directed.  Try to breathe deeply and cough as directed. Holding a pillow firmly over your ribs may help with discomfort.  If you were given an incentive spirometer in the hospital, continue to use it as directed.  Walk as directed by your caregiver.  You may take a shower and gently wash the area of your surgical cut (incision) with water and soap as directed. Do not use anything else to clean your incision except as directed by your caregiver. Do not take baths or sit in a hot tub. SEEK MEDICAL CARE IF:  You  notice redness, swelling, or increasing pain in the incision.  You are bleeding from the incision.  You see pus coming from the incision.  You notice a bad smell coming from the incision or dressing.  Your incision breaks open.  You cough up blood or pus, or you develop a cough that produces bad smelling sputum.  You have pain or swelling in your legs.  You have increasing pain that is not controlled with medicine.  You have trouble managing any of the tubes that have been left in place after surgery. SEEK IMMEDIATE MEDICAL CARE IF:   You have a fever or chills.  You have any reaction or side effects to medicines given.  You have chest pain or an irregular or rapid heartbeat.  You have dizzy episodes or fainting.  You have shortness of breath or difficulty breathing.  You have persistent nausea or vomiting.  You have a rash. MAKE SURE YOU:  Understand these instructions.  Will watch your condition.  Will get help right away if you are not doing well or get worse. Document Released: 01/02/2005 Document Revised: 09/07/2011 Document Reviewed: 02/12/2011 [facility] Patient Information 2013 [place], [place].

## 2012-10-19 ENCOUNTER — Encounter (HOSPITAL_COMMUNITY)
Admission: RE | Admit: 2012-10-19 | Discharge: 2012-10-19 | Disposition: A | Discharge status: Home / Self Care | Payer: Medicare HMO | Source: Ambulatory Visit | Attending: Cardiothoracic Surgery | Admitting: Cardiothoracic Surgery

## 2012-10-19 ENCOUNTER — Encounter (HOSPITAL_COMMUNITY): Payer: Self-pay

## 2012-10-19 VITALS — BP 144/90 | HR 76 | Temp 98.0°F | Resp 20 | Ht 67.0 in | Wt 274.0 lb

## 2012-10-19 DIAGNOSIS — R911 Solitary pulmonary nodule: Secondary | ICD-10-CM

## 2012-10-19 HISTORY — DX: Sleep apnea, unspecified: G47.30

## 2012-10-19 LAB — URINALYSIS, ROUTINE W REFLEX MICROSCOPIC
Bilirubin Urine: NEGATIVE
Glucose, UA: NEGATIVE mg/dL
Hgb urine dipstick: NEGATIVE
Ketones, ur: NEGATIVE mg/dL
Leukocytes, UA: NEGATIVE
Nitrite: NEGATIVE
Protein, ur: NEGATIVE mg/dL
Specific Gravity, Urine: 1.015 (ref 1.005–1.030)
Urobilinogen, UA: 0.2 mg/dL (ref 0.0–1.0)
pH: 5.5 (ref 5.0–8.0)

## 2012-10-19 LAB — BLOOD GAS, ARTERIAL
Acid-Base Excess: 1.6 mmol/L (ref 0.0–2.0)
Bicarbonate: 26.8 mEq/L — ABNORMAL HIGH (ref 20.0–24.0)
Drawn by: 181601
O2 Saturation: 92.2 %
Patient temperature: 98.6
TCO2: 28.3 mmol/L (ref 0–100)
pCO2 arterial: 51.3 mmHg — ABNORMAL HIGH (ref 35.0–45.0)
pH, Arterial: 7.337 — ABNORMAL LOW (ref 7.350–7.450)
pO2, Arterial: 64.9 mmHg — ABNORMAL LOW (ref 80.0–100.0)

## 2012-10-19 LAB — COMPREHENSIVE METABOLIC PANEL
ALT: 26 U/L (ref 0–35)
AST: 22 U/L (ref 0–37)
Albumin: 3.6 g/dL (ref 3.5–5.2)
Alkaline Phosphatase: 154 U/L — ABNORMAL HIGH (ref 39–117)
BUN: 11 mg/dL (ref 6–23)
CO2: 25 mEq/L (ref 19–32)
Calcium: 9.5 mg/dL (ref 8.4–10.5)
Chloride: 102 mEq/L (ref 96–112)
Creatinine, Ser: 0.58 mg/dL (ref 0.50–1.10)
GFR calc Af Amer: >90 mL/min (ref >90)
GFR calc non Af Amer: >90 mL/min (ref >90)
Glucose, Bld: 146 mg/dL — ABNORMAL HIGH (ref 70–99)
Potassium: 4.2 mEq/L (ref 3.5–5.1)
Sodium: 139 mEq/L (ref 135–145)
Total Bilirubin: 0.2 mg/dL — ABNORMAL LOW (ref 0.3–1.2)
Total Protein: 7.5 g/dL (ref 6.0–8.3)

## 2012-10-19 LAB — SURGICAL PCR SCREEN
MRSA, PCR: POSITIVE — AB
Staphylococcus aureus: POSITIVE — AB

## 2012-10-19 LAB — CBC
HCT: 44.5 % (ref 36.0–46.0)
Hemoglobin: 15.2 g/dL — ABNORMAL HIGH (ref 12.0–15.0)
MCH: 30.9 pg (ref 26.0–34.0)
MCHC: 34.2 g/dL (ref 30.0–36.0)
MCV: 90.4 fL (ref 78.0–100.0)
Platelets: 282 10*3/uL (ref 150–400)
RBC: 4.92 MIL/uL (ref 3.87–5.11)
RDW: 14.4 % (ref 11.5–15.5)
WBC: 10.3 10*3/uL (ref 4.0–10.5)

## 2012-10-19 LAB — ABO/RH: ABO/RH(D): O NEG

## 2012-10-19 LAB — APTT: aPTT: 30 seconds (ref 24–37)

## 2012-10-19 LAB — PROTIME-INR
INR: 0.9 (ref 0.00–1.49)
Prothrombin Time: 12.1 seconds (ref 11.6–15.2)

## 2012-10-19 NOTE — Pre-Procedure Instructions (Signed)
Katelyn Cline  10/19/2012   Your procedure is scheduled on: Friday, October 21, 2012  Report to [facility] at  5:30 AM.  Call this number if you have problems the morning of surgery: [phone]   Remember:   Do not eat food or drink liquids after midnight.   Take these medicines the morning of surgery with A SIP OF WATER: pt states that she will not take any am medication ( gabapentin (NEURONTIN) 100 MG capsule,  buPROPion (WELLBUTRIN SR) 200 MG 12 hr tablet) because" the doctor didn't want her to have sips of water." Stop taking Aspirin,  Coumadin, Plavix, Effient, and herbal medications fish oil-omega-3 fatty acids 1000 MG capsule)  Do not take any NSAIDs ie: Ibuprofen, Advil, Naproxen or any medication containing Aspirin (aspirin-acetaminophen-caffeine (EXCEDRIN MIGRAINE) 250-250-65 MG per tablet)     Do not wear jewelry, make-up or nail polish.  Do not wear lotions, powders, or perfumes. You may wear deodorant.  Do not shave 48 hours prior to surgery. Men may shave face and neck.  Do not bring valuables to the hospital.  Contacts, dentures or bridgework may not be worn into surgery.  Leave suitcase in the car. After surgery it may be brought to your room.  For patients admitted to the hospital, checkout time is 11:00 AM the day of discharge.   Patients discharged the day of surgery will not be allowed to drive home.  Name and phone number of your driver:   Special Instructions: Shower using CHG 2 nights before surgery and the night before surgery.  If you shower the day of surgery use CHG.  Use special wash - you have one bottle of CHG for all showers.  You should use approximately 1/3 of the bottle for each shower.   Please read over the following fact sheets that you were given: Pain Booklet, Coughing and Deep Breathing, Blood Transfusion Information and Surgical Site Infection Prevention

## 2012-10-19 NOTE — Progress Notes (Signed)
Pt denies SOB, chest pain, and being under the care of a cardiologist. Pt PCP is Dr. Hilma Favors in [place].

## 2012-10-20 MED ORDER — VANCOMYCIN HCL 10 G IV SOLR
1500.0000 mg | INTRAVENOUS | Status: AC
  Administered 2012-10-21: 1500 mg via INTRAVENOUS
  Filled 2012-10-20: qty 1500

## 2012-10-20 NOTE — Progress Notes (Signed)
Anesthesia Chart Review:  Patient is a 52 year old female scheduled for right VATS, lung resection, bronchoscopy for evaluation of a RUL lung lesion on 10/21/12 by Dr. Tyrone Sage.  History includes former smoker, morbid obesity, former smoker, narcolepsy, HTN, fibromyalgia, scoliosis, migraines, depression, panic attacks, agoraphobia, HLD, polycythemia vera, OSA, vertigo, peripheral neuropathy, hysterectomy, skin cancer.  PCP is Dr. Richmond Campbell.  EKG on 10/19/12 showed NSR, cannot rule out anteroseptal infarct (age undetermined).  Currently, there are no comparison EKGs available.  CXR on 10/19/12 showed: Right mid lung nodular density 16 x 10 mm. No acute infiltrate. PFTs on 09/22/12 showed FEV1 2.53 (84%).    Preoperative labs noted.  H/H 15.2/44.5.  Glucose 146.  Cr 0.58.  Clinical correlation on the day of surgery.  She did not report chest pain at her PAT visit or at her last visit with Dr. Tyrone Sage.  She does have chronic DOE.  If no new or progressive cardiopulmonary symptoms then would anticipate that she could proceed as planned.  [facility]/Anesthesiology Phone [phone] 10/20/2012 11:00 AM

## 2012-10-21 ENCOUNTER — Inpatient Hospital Stay (HOSPITAL_COMMUNITY)
Admission: RE | Admit: 2012-10-21 | Discharge: 2012-10-27 | DRG: 164 | Disposition: A | Discharge status: Home / Self Care | Payer: Medicare HMO | Source: Ambulatory Visit | Attending: Cardiothoracic Surgery | Admitting: Cardiothoracic Surgery

## 2012-10-21 ENCOUNTER — Encounter (HOSPITAL_COMMUNITY): Payer: Self-pay | Admitting: *Deleted

## 2012-10-21 ENCOUNTER — Encounter (HOSPITAL_COMMUNITY): Payer: Self-pay | Admitting: Vascular Surgery

## 2012-10-21 ENCOUNTER — Inpatient Hospital Stay (HOSPITAL_COMMUNITY): Payer: Medicare HMO | Admitting: Anesthesiology

## 2012-10-21 ENCOUNTER — Inpatient Hospital Stay (HOSPITAL_COMMUNITY): Payer: Medicare HMO

## 2012-10-21 ENCOUNTER — Encounter (HOSPITAL_COMMUNITY)
Admission: RE | Disposition: A | Discharge status: Home / Self Care | Payer: Self-pay | Source: Ambulatory Visit | Attending: Cardiothoracic Surgery

## 2012-10-21 DIAGNOSIS — J449 Chronic obstructive pulmonary disease, unspecified: Secondary | ICD-10-CM | POA: Diagnosis present

## 2012-10-21 DIAGNOSIS — J9819 Other pulmonary collapse: Secondary | ICD-10-CM | POA: Diagnosis not present

## 2012-10-21 DIAGNOSIS — E785 Hyperlipidemia, unspecified: Secondary | ICD-10-CM | POA: Diagnosis present

## 2012-10-21 DIAGNOSIS — R911 Solitary pulmonary nodule: Secondary | ICD-10-CM

## 2012-10-21 DIAGNOSIS — I10 Essential (primary) hypertension: Secondary | ICD-10-CM | POA: Diagnosis present

## 2012-10-21 DIAGNOSIS — Z85820 Personal history of malignant melanoma of skin: Secondary | ICD-10-CM | POA: Diagnosis present

## 2012-10-21 DIAGNOSIS — G609 Hereditary and idiopathic neuropathy, unspecified: Secondary | ICD-10-CM | POA: Diagnosis present

## 2012-10-21 DIAGNOSIS — R0902 Hypoxemia: Secondary | ICD-10-CM | POA: Diagnosis present

## 2012-10-21 DIAGNOSIS — C3410 Malignant neoplasm of upper lobe, unspecified bronchus or lung: Principal | ICD-10-CM | POA: Diagnosis present

## 2012-10-21 DIAGNOSIS — M4120 Other idiopathic scoliosis, site unspecified: Secondary | ICD-10-CM | POA: Diagnosis present

## 2012-10-21 DIAGNOSIS — G8929 Other chronic pain: Secondary | ICD-10-CM | POA: Diagnosis present

## 2012-10-21 DIAGNOSIS — J4489 Other specified chronic obstructive pulmonary disease: Secondary | ICD-10-CM | POA: Diagnosis present

## 2012-10-21 DIAGNOSIS — G43909 Migraine, unspecified, not intractable, without status migrainosus: Secondary | ICD-10-CM | POA: Diagnosis present

## 2012-10-21 DIAGNOSIS — G4730 Sleep apnea, unspecified: Secondary | ICD-10-CM | POA: Diagnosis present

## 2012-10-21 DIAGNOSIS — D381 Neoplasm of uncertain behavior of trachea, bronchus and lung: Secondary | ICD-10-CM

## 2012-10-21 DIAGNOSIS — IMO0001 Reserved for inherently not codable concepts without codable children: Secondary | ICD-10-CM | POA: Diagnosis present

## 2012-10-21 DIAGNOSIS — E119 Type 2 diabetes mellitus without complications: Secondary | ICD-10-CM | POA: Diagnosis present

## 2012-10-21 DIAGNOSIS — Z87891 Personal history of nicotine dependence: Secondary | ICD-10-CM

## 2012-10-21 DIAGNOSIS — G47419 Narcolepsy without cataplexy: Secondary | ICD-10-CM | POA: Diagnosis present

## 2012-10-21 DIAGNOSIS — J95811 Postprocedural pneumothorax: Secondary | ICD-10-CM | POA: Diagnosis not present

## 2012-10-21 DIAGNOSIS — D45 Polycythemia vera: Secondary | ICD-10-CM | POA: Diagnosis present

## 2012-10-21 DIAGNOSIS — R0602 Shortness of breath: Secondary | ICD-10-CM | POA: Diagnosis present

## 2012-10-21 HISTORY — PX: VIDEO BRONCHOSCOPY: SHX5072

## 2012-10-21 HISTORY — PX: VIDEO ASSISTED THORACOSCOPY (VATS)/WEDGE RESECTION: SHX6174

## 2012-10-21 SURGERY — VIDEO ASSISTED THORACOSCOPY (VATS)/WEDGE RESECTION
Anesthesia: General | Site: Chest | Laterality: Right | Wound class: Clean Contaminated

## 2012-10-21 MED ORDER — LIDOCAINE HCL (CARDIAC) 20 MG/ML IV SOLN
INTRAVENOUS | Status: DC | PRN
  Administered 2012-10-21: 50 mg via INTRAVENOUS

## 2012-10-21 MED ORDER — AMLODIPINE BESYLATE 5 MG PO TABS
5.0000 mg | ORAL_TABLET | Freq: Every day | ORAL | Status: DC
  Administered 2012-10-22 – 2012-10-27 (×6): 5 mg via ORAL
  Filled 2012-10-21 (×7): qty 1

## 2012-10-21 MED ORDER — FENTANYL CITRATE 0.05 MG/ML IJ SOLN
INTRAMUSCULAR | Status: DC | PRN
  Administered 2012-10-21: 100 ug via INTRAVENOUS
  Administered 2012-10-21 (×4): 50 ug via INTRAVENOUS

## 2012-10-21 MED ORDER — ONDANSETRON HCL 4 MG/2ML IJ SOLN: 4.0000 mg | Freq: Four times a day (QID) | INTRAMUSCULAR | Status: DC | PRN

## 2012-10-21 MED ORDER — HYDROMORPHONE HCL PF 1 MG/ML IJ SOLN
0.2500 mg | INTRAMUSCULAR | Status: DC | PRN
  Administered 2012-10-21 (×2): 0.5 mg via INTRAVENOUS

## 2012-10-21 MED ORDER — 0.9 % SODIUM CHLORIDE (POUR BTL) OPTIME
TOPICAL | Status: DC | PRN
  Administered 2012-10-21: 1000 mL

## 2012-10-21 MED ORDER — ATROPINE SULFATE 0.4 MG/ML IJ SOLN
INTRAMUSCULAR | Status: DC | PRN
  Administered 2012-10-21: .4 mg via INTRAVENOUS

## 2012-10-21 MED ORDER — BISACODYL 5 MG PO TBEC
10.0000 mg | DELAYED_RELEASE_TABLET | Freq: Every day | ORAL | Status: DC
  Administered 2012-10-21 – 2012-10-27 (×7): 10 mg via ORAL
  Filled 2012-10-21 (×7): qty 2

## 2012-10-21 MED ORDER — DULOXETINE HCL 60 MG PO CPEP
60.0000 mg | ORAL_CAPSULE | Freq: Every day | ORAL | Status: DC
  Administered 2012-10-21 – 2012-10-27 (×7): 60 mg via ORAL
  Filled 2012-10-21 (×7): qty 1

## 2012-10-21 MED ORDER — HYDROMORPHONE HCL PF 1 MG/ML IJ SOLN
INTRAMUSCULAR | Status: AC
  Administered 2012-10-21: 0.5 mg via INTRAVENOUS
  Filled 2012-10-21: qty 1

## 2012-10-21 MED ORDER — SODIUM CHLORIDE 0.9 % IV SOLN
10.0000 mg | INTRAVENOUS | Status: DC | PRN
  Administered 2012-10-21: 50 ug/min via INTRAVENOUS

## 2012-10-21 MED ORDER — POTASSIUM CHLORIDE 10 MEQ/50ML IV SOLN: 10.0000 meq | Freq: Every day | INTRAVENOUS | Status: DC | PRN

## 2012-10-21 MED ORDER — OXYCODONE HCL 5 MG/5ML PO SOLN: 5.0000 mg | Freq: Once | ORAL | Status: DC | PRN

## 2012-10-21 MED ORDER — PROPOFOL 10 MG/ML IV BOLUS
INTRAVENOUS | Status: DC | PRN
  Administered 2012-10-21: 200 mg via INTRAVENOUS

## 2012-10-21 MED ORDER — NALOXONE HCL 0.4 MG/ML IJ SOLN: 0.4000 mg | INTRAMUSCULAR | Status: DC | PRN

## 2012-10-21 MED ORDER — TRIAMTERENE-HCTZ 37.5-25 MG PO CAPS
1.0000 | ORAL_CAPSULE | Freq: Every day | ORAL | Status: DC
  Administered 2012-10-21 – 2012-10-27 (×8): 1 via ORAL
  Filled 2012-10-21 (×8): qty 1

## 2012-10-21 MED ORDER — LACTATED RINGERS IV SOLN
INTRAVENOUS | Status: DC | PRN
  Administered 2012-10-21: 07:00:00 via INTRAVENOUS

## 2012-10-21 MED ORDER — DIPHENHYDRAMINE HCL 50 MG/ML IJ SOLN: 12.5000 mg | Freq: Four times a day (QID) | INTRAMUSCULAR | Status: DC | PRN

## 2012-10-21 MED ORDER — FENTANYL 10 MCG/ML IV SOLN
INTRAVENOUS | Status: DC
  Administered 2012-10-21: 11:00:00 via INTRAVENOUS
  Administered 2012-10-21: 16 ug via INTRAVENOUS
  Administered 2012-10-21: 215 ug via INTRAVENOUS
  Administered 2012-10-21: 4 ug via INTRAVENOUS
  Administered 2012-10-21: 19:00:00 via INTRAVENOUS
  Administered 2012-10-22: 240 ug via INTRAVENOUS
  Administered 2012-10-22: 75 ug via INTRAVENOUS
  Administered 2012-10-22: 1 ug via INTRAVENOUS
  Administered 2012-10-22: 315 ug via INTRAVENOUS
  Administered 2012-10-22: 22:00:00 via INTRAVENOUS
  Administered 2012-10-23: 120 ug via INTRAVENOUS
  Administered 2012-10-23: 58 ug via INTRAVENOUS
  Administered 2012-10-23: 2 ug via INTRAVENOUS
  Administered 2012-10-23: 195 ug via INTRAVENOUS
  Administered 2012-10-23: 188.9 ug via INTRAVENOUS
  Administered 2012-10-23: 105 ug via INTRAVENOUS
  Administered 2012-10-23: 1 ug via INTRAVENOUS
  Administered 2012-10-24: 2 ug via INTRAVENOUS
  Administered 2012-10-24: 30 ug via INTRAVENOUS
  Filled 2012-10-21 (×5): qty 50

## 2012-10-21 MED ORDER — LACTATED RINGERS IV SOLN
INTRAVENOUS | Status: DC | PRN
  Administered 2012-10-21 (×2): via INTRAVENOUS

## 2012-10-21 MED ORDER — OXYCODONE-ACETAMINOPHEN 5-325 MG PO TABS
1.0000 | ORAL_TABLET | ORAL | Status: DC | PRN
  Administered 2012-10-24: 1 via ORAL
  Administered 2012-10-25: 0.5 via ORAL
  Administered 2012-10-25: 2 via ORAL
  Administered 2012-10-26 – 2012-10-27 (×2): 1 via ORAL
  Filled 2012-10-21: qty 1
  Filled 2012-10-21 (×2): qty 2
  Filled 2012-10-21 (×3): qty 1

## 2012-10-21 MED ORDER — SUCCINYLCHOLINE CHLORIDE 20 MG/ML IJ SOLN
INTRAMUSCULAR | Status: DC | PRN
  Administered 2012-10-21: 160 mg via INTRAVENOUS

## 2012-10-21 MED ORDER — VITAMIN D 50 MCG (2000 UT) PO CAPS: 1.0000 | ORAL_CAPSULE | Freq: Every day | ORAL | Status: DC

## 2012-10-21 MED ORDER — ROCURONIUM BROMIDE 100 MG/10ML IV SOLN
INTRAVENOUS | Status: DC | PRN
  Administered 2012-10-21: 70 mg via INTRAVENOUS
  Administered 2012-10-21: 20 mg via INTRAVENOUS
  Administered 2012-10-21: 30 mg via INTRAVENOUS

## 2012-10-21 MED ORDER — GABAPENTIN 100 MG PO CAPS
100.0000 mg | ORAL_CAPSULE | Freq: Four times a day (QID) | ORAL | Status: DC
  Administered 2012-10-21 – 2012-10-27 (×23): 100 mg via ORAL
  Filled 2012-10-21 (×27): qty 1

## 2012-10-21 MED ORDER — MUPIROCIN 2 % EX OINT
TOPICAL_OINTMENT | Freq: Two times a day (BID) | CUTANEOUS | Status: DC
  Administered 2012-10-21 – 2012-10-23 (×5): via NASAL
  Administered 2012-10-24: 1 via NASAL
  Administered 2012-10-24 – 2012-10-27 (×5): via NASAL
  Filled 2012-10-21: qty 22

## 2012-10-21 MED ORDER — VANCOMYCIN HCL IN DEXTROSE 1-5 GM/200ML-% IV SOLN
1000.0000 mg | Freq: Two times a day (BID) | INTRAVENOUS | Status: AC
  Administered 2012-10-21: 1000 mg via INTRAVENOUS
  Filled 2012-10-21: qty 200

## 2012-10-21 MED ORDER — MIDAZOLAM HCL 5 MG/5ML IJ SOLN
INTRAMUSCULAR | Status: DC | PRN
  Administered 2012-10-21: 2 mg via INTRAVENOUS

## 2012-10-21 MED ORDER — ATORVASTATIN CALCIUM 40 MG PO TABS
40.0000 mg | ORAL_TABLET | Freq: Every day | ORAL | Status: DC
  Administered 2012-10-21 – 2012-10-26 (×5): 40 mg via ORAL
  Filled 2012-10-21 (×7): qty 1

## 2012-10-21 MED ORDER — LEVALBUTEROL HCL 0.63 MG/3ML IN NEBU
0.6300 mg | INHALATION_SOLUTION | Freq: Four times a day (QID) | RESPIRATORY_TRACT | Status: DC
  Administered 2012-10-21 – 2012-10-27 (×23): 0.63 mg via RESPIRATORY_TRACT
  Filled 2012-10-21 (×33): qty 3

## 2012-10-21 MED ORDER — BUPROPION HCL ER (SR) 100 MG PO TB12
200.0000 mg | ORAL_TABLET | Freq: Two times a day (BID) | ORAL | Status: DC
  Administered 2012-10-21 – 2012-10-27 (×12): 200 mg via ORAL
  Filled 2012-10-21 (×13): qty 2

## 2012-10-21 MED ORDER — DEXTROSE-NACL 5-0.45 % IV SOLN
INTRAVENOUS | Status: DC
  Administered 2012-10-23: 16:00:00 via INTRAVENOUS

## 2012-10-21 MED ORDER — SODIUM CHLORIDE 0.9 % IJ SOLN: 9.0000 mL | INTRAMUSCULAR | Status: DC | PRN

## 2012-10-21 MED ORDER — OXYCODONE HCL 5 MG PO TABS: 5.0000 mg | ORAL_TABLET | Freq: Once | ORAL | Status: DC | PRN

## 2012-10-21 MED ORDER — ACETAMINOPHEN 10 MG/ML IV SOLN
1000.0000 mg | Freq: Four times a day (QID) | INTRAVENOUS | Status: AC
  Administered 2012-10-21 – 2012-10-22 (×3): 1000 mg via INTRAVENOUS
  Filled 2012-10-21 (×3): qty 100

## 2012-10-21 MED ORDER — PROMETHAZINE HCL 25 MG/ML IJ SOLN: 6.2500 mg | INTRAMUSCULAR | Status: DC | PRN

## 2012-10-21 MED ORDER — ACETAMINOPHEN 10 MG/ML IV SOLN
INTRAVENOUS | Status: AC
  Administered 2012-10-21: 1000 mg via INTRAVENOUS
  Filled 2012-10-21: qty 100

## 2012-10-21 MED ORDER — AMITRIPTYLINE HCL 25 MG PO TABS
25.0000 mg | ORAL_TABLET | Freq: Every day | ORAL | Status: DC
  Administered 2012-10-21 – 2012-10-25 (×5): 25 mg via ORAL
  Filled 2012-10-21 (×7): qty 1

## 2012-10-21 MED ORDER — BUPIVACAINE 0.5 % ON-Q PUMP SINGLE CATH 400 ML
400.0000 mL | INJECTION | Status: AC
  Filled 2012-10-21: qty 400

## 2012-10-21 MED ORDER — MODAFINIL 200 MG PO TABS
200.0000 mg | ORAL_TABLET | Freq: Every day | ORAL | Status: DC
  Administered 2012-10-24 – 2012-10-25 (×2): 200 mg via ORAL
  Filled 2012-10-21 (×4): qty 1

## 2012-10-21 MED ORDER — DIPHENHYDRAMINE HCL 12.5 MG/5ML PO ELIX
12.5000 mg | ORAL_SOLUTION | Freq: Four times a day (QID) | ORAL | Status: DC | PRN
  Filled 2012-10-21: qty 5

## 2012-10-21 MED ORDER — VITAMIN D3 25 MCG (1000 UNIT) PO TABS
2000.0000 [IU] | ORAL_TABLET | Freq: Every day | ORAL | Status: DC
  Administered 2012-10-21 – 2012-10-27 (×7): 2000 [IU] via ORAL
  Filled 2012-10-21 (×7): qty 2

## 2012-10-21 MED ORDER — OXYCODONE HCL 5 MG PO TABS: 5.0000 mg | ORAL_TABLET | ORAL | Status: AC | PRN

## 2012-10-21 SURGICAL SUPPLY — 77 items
APPLICATOR TIP COSEAL (VASCULAR PRODUCTS) IMPLANT
APPLICATOR TIP EXT COSEAL (VASCULAR PRODUCTS) IMPLANT
BLADE SURG 11 STRL SS (BLADE) ×3 IMPLANT
BRUSH CYTOL CELLEBRITY 1.5X140 (MISCELLANEOUS) IMPLANT
CANISTER SUCTION 2500CC (MISCELLANEOUS) ×3 IMPLANT
CATH KIT ON Q 5IN SLV (PAIN MANAGEMENT) ×3 IMPLANT
CATH THORACIC 28FR (CATHETERS) IMPLANT
CATH THORACIC 36FR (CATHETERS) IMPLANT
CATH THORACIC 36FR RT ANG (CATHETERS) IMPLANT
CLIP TI MEDIUM 6 (CLIP) ×3 IMPLANT
CLOTH BEACON ORANGE TIMEOUT ST (SAFETY) ×6 IMPLANT
CONT SPEC 4OZ CLIKSEAL STRL BL (MISCELLANEOUS) ×12 IMPLANT
COVER TABLE BACK 60X90 (DRAPES) ×3 IMPLANT
DRAPE LAPAROSCOPIC ABDOMINAL (DRAPES) ×3 IMPLANT
DRAPE SLUSH/WARMER DISC (DRAPES) ×3 IMPLANT
DRAPE WARM FLUID 44X44 (DRAPE) IMPLANT
DRILL BIT 7/64X5 (BIT) ×3 IMPLANT
ELECT BLADE 4.0 EZ CLEAN MEGAD (MISCELLANEOUS) ×3
ELECT REM PT RETURN 9FT ADLT (ELECTROSURGICAL) ×3
ELECTRODE BLDE 4.0 EZ CLN MEGD (MISCELLANEOUS) ×2 IMPLANT
ELECTRODE REM PT RTRN 9FT ADLT (ELECTROSURGICAL) ×2 IMPLANT
FORCEPS BIOP RJ4 1.8 (CUTTING FORCEPS) IMPLANT
GLOVE BIO SURGEON STRL SZ 6.5 (GLOVE) ×18 IMPLANT
GLOVE BIOGEL PI IND STRL 6.5 (GLOVE) ×4 IMPLANT
GLOVE BIOGEL PI IND STRL 7.0 (GLOVE) ×6 IMPLANT
GLOVE BIOGEL PI INDICATOR 6.5 (GLOVE) ×2
GLOVE BIOGEL PI INDICATOR 7.0 (GLOVE) ×3
GOWN STRL NON-REIN LRG LVL3 (GOWN DISPOSABLE) ×18 IMPLANT
HANDLE STAPLE ENDO GIA SHORT (STAPLE) ×1
KIT BASIN OR (CUSTOM PROCEDURE TRAY) ×3 IMPLANT
KIT ROOM TURNOVER OR (KITS) ×3 IMPLANT
KIT SUCTION CATH 14FR (SUCTIONS) ×3 IMPLANT
MARKER SKIN DUAL TIP RULER LAB (MISCELLANEOUS) ×3 IMPLANT
NEEDLE BIOPSY TRANSBRONCH 21G (NEEDLE) IMPLANT
NS IRRIG 1000ML POUR BTL (IV SOLUTION) ×6 IMPLANT
OIL SILICONE PENTAX (PARTS (SERVICE/REPAIRS)) ×3 IMPLANT
PACK CHEST (CUSTOM PROCEDURE TRAY) ×3 IMPLANT
PAD ARMBOARD 7.5X6 YLW CONV (MISCELLANEOUS) ×9 IMPLANT
RELOAD EGIA 45 MED/THCK PURPLE (STAPLE) ×3 IMPLANT
RELOAD EGIA 60 MED/THCK PURPLE (STAPLE) ×6 IMPLANT
SCISSORS LAP 5X35 DISP (ENDOMECHANICALS) IMPLANT
SEALANT PROGEL (MISCELLANEOUS) IMPLANT
SEALANT SURG COSEAL 4ML (VASCULAR PRODUCTS) IMPLANT
SEALANT SURG COSEAL 8ML (VASCULAR PRODUCTS) IMPLANT
SOLUTION ANTI FOG 6CC (MISCELLANEOUS) ×3 IMPLANT
SPONGE GAUZE 4X4 12PLY (GAUZE/BANDAGES/DRESSINGS) ×6 IMPLANT
STAPLER ENDO GIA 12MM SHORT (STAPLE) ×2 IMPLANT
SUT PROLENE 3 0 SH DA (SUTURE) IMPLANT
SUT PROLENE 4 0 RB 1 (SUTURE)
SUT PROLENE 4-0 RB1 .5 CRCL 36 (SUTURE) IMPLANT
SUT SILK  1 MH (SUTURE) ×2
SUT SILK 1 MH (SUTURE) ×4 IMPLANT
SUT SILK 2 0 SH (SUTURE) IMPLANT
SUT SILK 2 0SH CR/8 30 (SUTURE) ×3 IMPLANT
SUT SILK 3 0SH CR/8 30 (SUTURE) IMPLANT
SUT STEEL 1 (SUTURE) IMPLANT
SUT VIC AB 1 CTX 18 (SUTURE) ×3 IMPLANT
SUT VIC AB 1 CTX 36 (SUTURE) ×1
SUT VIC AB 1 CTX36XBRD ANBCTR (SUTURE) ×2 IMPLANT
SUT VIC AB 2-0 CTX 36 (SUTURE) ×3 IMPLANT
SUT VIC AB 2-0 UR6 27 (SUTURE) IMPLANT
SUT VIC AB 3-0 SH 8-18 (SUTURE) IMPLANT
SUT VIC AB 3-0 X1 27 (SUTURE) ×3 IMPLANT
SUT VICRYL 2 TP 1 (SUTURE) ×3 IMPLANT
SWAB COLLECTION DEVICE MRSA (MISCELLANEOUS) IMPLANT
SYR 20ML ECCENTRIC (SYRINGE) ×3 IMPLANT
SYSTEM SAHARA CHEST DRAIN ATS (WOUND CARE) ×3 IMPLANT
TIP APPLICATOR SPRAY EXTEND 16 (VASCULAR PRODUCTS) IMPLANT
TOWEL OR 17X24 6PK STRL BLUE (TOWEL DISPOSABLE) ×9 IMPLANT
TOWEL OR 17X26 10 PK STRL BLUE (TOWEL DISPOSABLE) ×6 IMPLANT
TRAP SPECIMEN MUCOUS 40CC (MISCELLANEOUS) ×3 IMPLANT
TRAY FOLEY CATH 14FRSI W/METER (CATHETERS) ×3 IMPLANT
TUBE ANAEROBIC SPECIMEN COL (MISCELLANEOUS) IMPLANT
TUBE CONNECTING 12X1/4 (SUCTIONS) ×6 IMPLANT
TUNNELER SHEATH ON-Q 11GX8 (MISCELLANEOUS) ×3 IMPLANT
TUNNELER SHEATH ON-Q 16GX12 DP (PAIN MANAGEMENT) ×3 IMPLANT
WATER STERILE IRR 1000ML POUR (IV SOLUTION) ×6 IMPLANT

## 2012-10-21 NOTE — Anesthesia Postprocedure Evaluation (Signed)
  Anesthesia Post-op Note  Patient: Katelyn Cline  Procedure(s) Performed: Procedure(s): VIDEO ASSISTED THORACOSCOPY (VATS)/WEDGE RESECTION (Right) VIDEO BRONCHOSCOPY (N/A)  Patient Location: PACU  Anesthesia Type:General  Level of Consciousness: awake  Airway and Oxygen Therapy: Patient Spontanous Breathing  Post-op Pain: mild  Post-op Assessment: Post-op Vital signs reviewed, Patient's Cardiovascular Status Stable, Respiratory Function Stable, Patent Airway, No signs of Nausea or vomiting and Pain level controlled  Post-op Vital Signs: stable  Complications: No apparent anesthesia complications

## 2012-10-21 NOTE — Preoperative (Signed)
Beta Blockers   Reason not to administer Beta Blockers:Not Applicable 

## 2012-10-21 NOTE — Transfer of Care (Signed)
Immediate Anesthesia Transfer of Care Note  Patient: Katelyn Cline  Procedure(s) Performed: Procedure(s): VIDEO ASSISTED THORACOSCOPY (VATS)/WEDGE RESECTION (Right) VIDEO BRONCHOSCOPY (N/A)  Patient Location: PACU  Anesthesia Type:General  Level of Consciousness: sedated  Airway & Oxygen Therapy: Patient Spontanous Breathing and non-rebreather face mask  Post-op Assessment: Report given to PACU RN, Post -op Vital signs reviewed and stable and Patient moving all extremities X 4  Post vital signs: Reviewed and stable  Complications: No apparent anesthesia complications

## 2012-10-21 NOTE — Progress Notes (Signed)
Patient ID: Katelyn Cline, female   DOB: [birth year], 52 y.o.   MRN: [id number]   SICU Evening Rounds:  Hemodynamically stable  sats 95%  CT output low  Urine output ok.

## 2012-10-21 NOTE — Progress Notes (Signed)
UR Completed.  Onesti Bonfiglio Jane [phone] 10/21/2012  

## 2012-10-21 NOTE — Brief Op Note (Addendum)
10/21/2012  10:18 AM  PATIENT:  Katelyn Cline  52 y.o. 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  PROCEDURE:  Procedure(s):  VIDEO ASSISTED THORACOSCOPY/MINI THORACOTOMY -Wedge Resection Right Upper Lobe -Lymph Node Sampling -Insertion of On-Q pain ball  VIDEO BRONCHOSCOPY (N/A)  SURGEON:  Surgeon(s) and Role:    * Delight Ovens, MD - Primary  PHYSICIAN ASSISTANT: Erin Barrett PA-C  ANESTHESIA:   general  EBL:  Total I/O In: 1800 [I.V.:1800] Out: 150 [Urine:100; Blood:50]  BLOOD ADMINISTERED:none  DRAINS: 28 Straight Chest tube right chest   LOCAL MEDICATIONS USED:  MARCAINE     SPECIMEN:  Source of Specimen:  Right Upper Lobe Wedge, Lymph Nodes station 10 and 4  DISPOSITION OF SPECIMEN:  PATHOLOGY  COUNTS:  YES   DICTATION: .Dragon Dictation  PLAN OF CARE: Admit to inpatient   PATIENT DISPOSITION:  PACU - hemodynamically stable.   Delay start of Pharmacological VTE agent (>24hrs) due to surgical blood loss or risk of bleeding: yes

## 2012-10-21 NOTE — Anesthesia Preprocedure Evaluation (Addendum)
Anesthesia Evaluation  Patient identified by MRN, date of birth, ID band Patient awake    Reviewed: Allergy & Precautions, H&P , NPO status , Patient's Chart, lab work & pertinent test results  Airway Mallampati: II TM Distance: >3 FB Neck ROM: Full    Dental  (+) Dental Advidsory Given and Teeth Intact   Pulmonary shortness of breath, sleep apnea , COPDformer smoker,  Lung mass breath sounds clear to auscultation        Cardiovascular hypertension, + dysrhythmias Rhythm:Regular Rate:Normal     Neuro/Psych  Headaches, Anxiety Depression  Neuromuscular disease    GI/Hepatic   Endo/Other  Morbid obesity  Renal/GU      Musculoskeletal  (+) Fibromyalgia -  Abdominal (+) + obese,   Peds  Hematology  (+) Blood dyscrasia, ,   Anesthesia Other Findings   Reproductive/Obstetrics                          Anesthesia Physical Anesthesia Plan  ASA: III  Anesthesia Plan: General   Post-op Pain Management:    Induction: Intravenous  Airway Management Planned: Double Lumen EBT  Additional Equipment: Arterial line and CVP  Intra-op Plan:   Post-operative Plan: Extubation in OR  Informed Consent: I have reviewed the patients History and Physical, chart, labs and discussed the procedure including the risks, benefits and alternatives for the proposed anesthesia with the patient or authorized representative who has indicated his/her understanding and acceptance.   Dental Advisory Given  Plan Discussed with: CRNA, Surgeon and Anesthesiologist  Anesthesia Plan Comments:        Anesthesia Quick Evaluation

## 2012-10-21 NOTE — Progress Notes (Signed)
RT note Pt on 50% VM plus 5L [place] in order to maintain sat at desired level.

## 2012-10-21 NOTE — Progress Notes (Signed)
Pt. Received from OR via bed, IV, O2.  Pt. Moaning/yelling in pain and disoriented. Pt. Comforted and given dilaudid.  Pt. Requiring NRB to maintain sats> 92%.  Dr. Tyrone Sage at bedside and aware.  He stated pt. Chronically has low oxygen saturations.

## 2012-10-21 NOTE — H&P (Signed)
[address]       [place] [address]             [phone]         Katelyn Cline [facility] Medical Record #[id number] Date of Birth: 20-Dec-1960  Referring: [place] [facility]: Katelyn Antis, MD  Chief Complaint:    No chief complaint on file.   History of Present Illness:    Patient with long term history of smoking referred to oncology for evaluation of polycythemia. CT of chest and abdomen was done and a lung nodule was noted . PET scan done and patient referred to thoracic surgery for evaluation of lung nodule.Po2 noted to be 55 on abg. Patient has sob with activity.   Patient returns to the office today for further discussion on the evaluation of her lung nodule and also to evaluate her pulmonary function studies. She continues to avoid cigarettes. She has also undergone phlebotomy for her newly diagnosed polycythemia  Current Activity/ Functional Status:  Patient is independent with mobility/ambulation, transfers, ADL's, IADL's.  Zubrod Score: At the time of surgery this patient's most appropriate activity status/level should be described as: []  Normal activity, no symptoms [x]  Symptoms, fully ambulatory []  Symptoms, in bed less than or equal to 50% of the time []  Symptoms, in bed greater than 50% of the time but less than 100% []  Bedridden []  Moribund   Past Medical History  Diagnosis Date  . Narcolepsy   . HTN (hypertension)   . Fibromyalgia   . Scoliosis   . Migraines   . Major depression   . Panic attacks   . Agoraphobia   . Chronic pain   . Miscarriage     x 4  . Peripheral neuropathy   . Hyperlipidemia   . Basal cell carcinoma   . Elevated hemoglobin 2014  . Vertigo   . Polycythemia secondary to smoking   . Polycythemia vera   . Dysrhythmia     Hx: of palpitations "a long time ago"  . Shortness of breath     Hx: of with activity  . Sleep apnea     Past Surgical History  Procedure Laterality Date  . Vaginal  hysterectomy    . Breast lumpectomy      bilateral  . Cystectomy      abdominal wall   . Basal cell carcinoma excision    . Colonoscopy w/ biopsies and polypectomy      Hx: of  . Dilation and curettage of uterus      Hx: of     Family History  Problem Relation Age of Onset  . Arthritis Mother   . Hyperlipidemia Mother   . Depression Mother   . Anxiety disorder Mother   . Dementia Mother   . Hypertension Father   . Hyperlipidemia Father   . Heart disease Father   . Stroke Father   . Dementia Father   . Heart disease Brother   . Alcohol abuse Maternal Grandfather   . Bipolar disorder Neg Hx   . Drug abuse Neg Hx   . OCD Neg Hx   . Paranoid behavior Neg Hx   . Schizophrenia Neg Hx   . Seizures Neg Hx   . Sexual abuse Neg Hx   . Physical abuse Neg Hx   . ADD / ADHD Son       History  Smoking status  . Former Smoker -- 2.00 packs/day for 5 years  . Types: Cigarettes  .  Quit date: 09/16/2012  Smokeless tobacco  . Not on file    History  Alcohol Use  . Yes    Comment: 1 -2 drinks a month, occasional     Allergies  Allergen Reactions  . Contrast Media (Iodinated Diagnostic Agents) Anaphylaxis  . Flagyl (Metronidazole)   . Penicillins   . Tetracyclines & Related     No current facility-administered medications for this encounter.   Facility-Administered Medications Ordered in Other Encounters  Medication Dose Route Frequency Provider Last Rate Last Dose  . fentaNYL (SUBLIMAZE) injection    PRN Carmela Rima, CRNA   50 mcg at 10/21/12 [phone]  . lactated ringers infusion    Continuous PRN Carmela Rima, CRNA      . lactated ringers infusion    Continuous PRN Carmela Rima, CRNA      . midazolam (VERSED) 5 MG/5ML injection    PRN Carmela Rima, CRNA   2 mg at 10/21/12 [id number]       Review of Systems:     Cardiac Review of Systems: Y or N  Chest Pain [ n   ]  Resting SOB [n  ] Exertional SOB  [ y ]  Myer Peer  ]   Pedal Edema [ n  ]      Palpitations [ y ] Syncope  [n  ]   Presyncope [n   ]  General Review of Systems: [Y] = yes [  ]=no Constitional: recent weight change [ n ]; anorexia [  ]; fatigue [ y ]; nausea [  ]; night sweats [  ]; fever [  ]; or chills [  ];                                                                                                                                          Dental: poor dentition[ y ]; Last Dentist visit:   Eye : blurred vision [  ]; diplopia [   ]; vision changes [  ];  Amaurosis fugax[  ]; Resp: cough [  ];  wheezing[y  ];  hemoptysis[ n ]; shortness of breath[y  ]; paroxysmal nocturnal dyspnea[ y ]; dyspnea on exertion[y  ]; or orthopnea[y  ];  GI:  gallstones[  ], vomiting[  ];  dysphagia[  ]; melena[  ];  hematochezia [  ]; heartburn[  ];   Hx of  Colonoscopy[  ]; GU: kidney stones [  ]; hematuria[  ];   dysuria [ y ];  nocturia[  ];  history of     obstruction [  ]; urinary frequency [  ]             Skin: rash, swelling[  ];, hair loss[  ];  peripheral edema[  ];  or itching[  ]; Musculosketetal: myalgias[ y ];  joint swelling[ y ];  joint erythema[  ];  joint pain[  ];  back pain[ y ];  Heme/Lymph: bruising[  ];  bleeding[  ];  anemia[  ];  Neuro: TIA[  ];  headaches[  ];  stroke[  ];  vertigo[  ];  seizures[  ];   paresthesias[  ];  difficulty walking[  ];  Psych:depression[ y ]; anxiety[ y ];  Endocrine: diabetes[  ];  thyroid dysfunction[  ];  Immunizations: Flu [  ]; Pneumococcal[  ];  Other:  Physical Exam: BP 142/82  Pulse 71  Temp(Src) 98.2 F (36.8 C) (Oral)  Resp 20  SpO2 94%  General appearance: alert, cooperative, appears older than stated age, no distress and morbidly obese Neurologic: intact Heart: regular rate and rhythm, S1, S2 normal, no murmur, click, rub or gallop and normal apical impulse Lungs: diminished breath sounds bilaterally Abdomen: soft, non-tender; bowel sounds normal; no masses,  no organomegaly Extremities: extremities normal,  atraumatic, no cyanosis or edema no carotid bruits, healing scar lrft nose fac from resection of basal cell CA palpable dt dp bilaterial  Diagnostic Studies & Laboratory data:     Recent Radiology Findings:  Dg Chest 2 View  10/19/2012  *RADIOLOGY REPORT*  Clinical Data:  Hypertension, shortness of breath, sleep apnea, smoker, preoperative assessment for VATS  CHEST - 2 VIEW  Comparison: None Correlation:  PET CT 09/08/2012  Findings: Normal heart size, mediastinal contours, and pulmonary vascularity. Vague nodular density mid right lung approximately 16 x 10 mm in size. Remaining lungs clear. No pleural effusion or pneumothorax. Bones unremarkable.  IMPRESSION: Right mid lung nodular density 16 x 10 mm. No acute infiltrate.   Original Report Authenticated By: Ulyses Southward, M.D.    .Ct Chest Wo Contrast  08/25/2012  *RADIOLOGY REPORT*  Clinical Data:  Polycythemia.  Erythrocytosis.  Smoker long-term  CT CHEST, ABDOMEN AND PELVIS WITHOUT CONTRAST  Technique:  Multidetector CT imaging of the chest, abdomen and pelvis was performed following the standard protocol without IV contrast.  Comparison:   None.  CT CHEST  Findings:  There is no axillary or supraclavicular lymphadenopathy. No mediastinal or hilar lymphadenopathy.  5 mm right lower paratracheal lymph node is not pathologic by size criteria.  No pericardial fluid.  Coronary calcifications are present.  Review of the lung parenchyma demonstrates a solitary 12 mm nodule within the central right upper lobe (image 30).  Lesion does have low attenuation ( Hounsfield units less than zero but this is a small lesion and low density could reflect volume averaging). There is a fine reticular pattern with some nodularity in the right upper lobe  and right middle lobe which may represent bronchiolitis.  IMPRESSION:  1.  Solitary low attenuation right upper lobe pulmonary nodule is concerning for bronchogenic carcinoma versus hamartoma.  In patient with longstanding  smoking history,   recommend FDG PET CT scan to determine malignant potential. 2.  Mild reticular pattern in the upper lobe may relate to smoking history. 3.  No evidence of mediastinal lymphadenopathy. 4.  Coronary artery calcifications noted.  Non-IV contrast  CT ABDOMEN AND PELVIS  Findings:  Non-IV contrast images demonstrate no focal hepatic lesion.  Gallbladder, pancreas, spleen, adrenal glands are normal. The kidneys are normal.  The stomach, small bowel, appendix, and cecum are normal.  The colon and rectum are unremarkable.  Abdominal aorta normal caliber.  No retroperitoneal periportal lymphadenopathy.  No free fluid the pelvis.  Hysterectomy anatomy.  The bladder is normal.  No pelvic lymphadenopathy. Review of  bone windows demonstrates  no aggressive osseous lesions.  IMPRESSION: 1.  No evidence of adenopathy or malignancy in the abdomen or pelvis. 2.  Please see above chest section for solitary pulmonary nodule.   Original Report Authenticated By: Genevive Bi, M.D.    Mr Laqueta Jean Wo Contrast  09/19/2012  *RADIOLOGY REPORT*  Clinical Data: 52 year old female with lung nodule suspicious for bronchogenic carcinoma.  Headache.  Polycythemia.  MRI HEAD WITHOUT AND WITH CONTRAST  Technique:  Multiplanar, multiecho pulse sequences of the brain and surrounding structures were obtained according to standard protocol without and with intravenous contrast  Contrast: 20mL MULTIHANCE GADOBENATE DIMEGLUMINE 529 MG/ML IV SOLN  Comparison: PET CT 09/08/2012.  Findings: No abnormal enhancement identified.  No abnormal diffusion.  No areas of cerebral edema identified. No midline shift, mass effect, or evidence of mass lesion.  Cerebral volume is within normal limits for age.  No restricted diffusion to suggest acute infarction.  No ventriculomegaly. No acute intracranial hemorrhage identified.  Negative pituitary, cervicomedullary junction visualized cervical spine. Major intracranial vascular flow voids are  preserved.  Normal for age gray-white matter signal throughout the brain.  Normal bone marrow signal at the skull base.  No suspicious calvarial lesion identified. Visualized paranasal sinuses and mastoids are clear.  Negative scalp soft tissues.  IMPRESSION: No acute or metastatic intracranial abnormality. Negative MRI appearance of the brain.   Original Report Authenticated By: Erskine Speed, M.D.    US Breast Right  09/07/2012  *RADIOLOGY REPORT*  Clinical Data:  Mass right breast identified on recent screening mammogram.  The patient states that prior to February 2014, her most recent mammogram was 10 years or greater ago.  DIGITAL DIAGNOSTIC BILATERAL MAMMOGRAM WITHOUT CAD AND RIGHT BREAST ULTRASOUND:  Comparison:  08/22/2012  Findings:  ACR Breast Density Category 1: The breast tissue is almost entirely fatty.  Focal spot compression views of the deep aspect of the 6 o'clock region of the right breast show a circumscribed oblong oval nodule, with a possible fatty hilum noted in the MLO projection.  Focal spot compression view of the left breast in the MLO projection shows no suspicious mass or distortion.  On physical exam, no mass is palpated in the 6 o'clock region of the right breast.  Ultrasound is performed, showing approximately 10 x 3 x 9 mm circumscribed oval mass with peripheral hypoechogenicity and central echogenicity, most consistent with a benign intramammary lymph node, at 6 o'clock position 5 cm from the nipple. Color flow is seen in the fatty hilum, and adjacent to the probable lymph node.  IMPRESSION: Probable intramammary lymph node right breast 6 o'clock position 5 cm from the nipple.  RECOMMENDATION: 73-month follow-up right breast ultrasound.  I have discussed the findings and recommendations with the patient. Results were also provided in writing at the conclusion of the visit.  BI-RADS CATEGORY 3:  Probably benign finding(s) - short interval follow-up suggested.   Original Report  Authenticated By: Britta Mccreedy, M.D.    Nm Pet Image Initial (pi) Skull Base To Thigh  09/08/2012  *RADIOLOGY REPORT*  Clinical Data: Initial treatment strategy for solitary right upper lobe pulmonary nodule.  NUCLEAR MEDICINE PET SKULL BASE TO THIGH  Fasting Blood Glucose:  148  Technique:  18.6 mCi F-18 FDG was injected intravenously. CT data was obtained and used for attenuation correction and anatomic localization only.  (This was not acquired as a diagnostic CT examination.) Additional exam technical data entered on technologist worksheet.  Comparison:  CT chest abdomen pelvis dated  08/25/2012  Findings:  Neck: No hypermetabolic lymph nodes in the neck.  Chest:  12 x 9 mm posterior right upper lobe pulmonary nodule (series 2/image 89) max SUV 3.0.  No hypermetabolic mediastinal or hilar nodes.  Suspected mild coronary atherosclerosis.  Atherosclerotic calcifications of the aortic arch.  Abdomen/Pelvis:  No abnormal hypermetabolic activity within the liver, pancreas, adrenal glands, or spleen.  Atherosclerotic calcifications of the abdominal aorta and branch vessels.  Colonic diverticulosis, without associated inflammatory changes.  Prior hysterectomy.  No hypermetabolic lymph nodes in the abdomen or pelvis.  Skeleton:  No focal hypermetabolic activity to suggest skeletal metastasis.  IMPRESSION: 12 mm posterior right upper lobe pulmonary nodule, max SUV 3.0, suspicious for primary bronchogenic neoplasm.  No evidence of metastasis.   Original Report Authenticated By: Charline Bills, M.D.    Mm Digital Diagnostic Bilat  09/07/2012  *RADIOLOGY REPORT*  Clinical Data:  Mass right breast identified on recent screening mammogram.  The patient states that prior to February 2014, her most recent mammogram was 10 years or greater ago.  DIGITAL DIAGNOSTIC BILATERAL MAMMOGRAM WITHOUT CAD AND RIGHT BREAST ULTRASOUND:  Comparison:  08/22/2012  Findings:  ACR Breast Density Category 1: The breast tissue is almost  entirely fatty.  Focal spot compression views of the deep aspect of the 6 o'clock region of the right breast show a circumscribed oblong oval nodule, with a possible fatty hilum noted in the MLO projection.  Focal spot compression view of the left breast in the MLO projection shows no suspicious mass or distortion.  On physical exam, no mass is palpated in the 6 o'clock region of the right breast.  Ultrasound is performed, showing approximately 10 x 3 x 9 mm circumscribed oval mass with peripheral hypoechogenicity and central echogenicity, most consistent with a benign intramammary lymph node, at 6 o'clock position 5 cm from the nipple. Color flow is seen in the fatty hilum, and adjacent to the probable lymph node.  IMPRESSION: Probable intramammary lymph node right breast 6 o'clock position 5 cm from the nipple.  RECOMMENDATION: 62-month follow-up right breast ultrasound.  I have discussed the findings and recommendations with the patient. Results were also provided in writing at the conclusion of the visit.  BI-RADS CATEGORY 3:  Probably benign finding(s) - short interval follow-up suggested.   Original Report Authenticated By: Britta Mccreedy, M.D.    Mm Digital Screening  08/26/2012  *RADIOLOGY REPORT*  Clinical Data: Screening.  DIGITAL BILATERAL SCREENING MAMMOGRAM WITH CAD  Comparison:  None.  FINDINGS:  ACR Breast Density Category 2: There is a scattered fibroglandular pattern.  Possible masses are noted in both breasts.  Images were processed with CAD.  IMPRESSION: Further evaluation is suggested for possible masses in both breasts.  RECOMMENDATION: Diagnostic mammogram and possibly ultrasound of both breasts. (Code:FI-B-58M)  The patient will be contacted regarding the findings, and additional imaging will be scheduled.  BI-RADS CATEGORY 0:  Incomplete.  Need additional imaging evaluation and/or prior mammograms for comparison.   Original Report Authenticated By: Beckie Salts, M.D.      Recent Lab  Findings: Lab Results  Component Value Date   WBC 10.3 10/19/2012   HGB 15.2* 10/19/2012   HCT 44.5 10/19/2012   PLT 282 10/19/2012   GLUCOSE 146* 10/19/2012   CHOL 195 06/09/2012   TRIG 359* 06/09/2012   HDL 34* 06/09/2012   LDLCALC 89 06/09/2012   ALT 26 10/19/2012   AST 22 10/19/2012   NA 139 10/19/2012   K 4.2 10/19/2012  CL 102 10/19/2012   CREATININE 0.58 10/19/2012   BUN 11 10/19/2012   CO2 25 10/19/2012   TSH 3.474 06/09/2012   INR 0.90 10/19/2012   HGBA1C 6.2* 06/09/2012   PFT's FEV1 2.53  84 %, DLCO 17.34  61% Repeat abg"  ph 7.41 pC02 46  PO2 68   Assessment / Plan:      Right upper lobe lung nodule in long term smoker suspicious for Lung cancer ,12 mm posterior right upper lobe pulmonary nodule, max SUV 3.0, Multifactorial polycythemia, smoking  related/hypoxia and polycythemia vera, Jak-2 positive I discussed with the patient the radiographic findings and suspicion in a smoker of the lung nodule potential to be malignant, the SUV maximum is only 3.0 however.   Patient returned and wished to proceed with resection of rt lung nodule. She has been off tobacco since early March. PFT and ABG reviewed a, she does have limited pul function. I have reviewed risk and options of DX and treatment.  The goals risks and alternatives of the planned surgical procedure Bronchoscopy RT VATS and lung resection  have been discussed with the patient in detail. The risks of the procedure including death, infection, stroke, myocardial infarction, bleeding, blood transfusion have all been discussed specifically.  I have quoted Katelyn Cline a 4% of perioperative mortality and a complication rate as high as 30 %. The patient's questions have been answered.Katelyn Cline is willing  to proceed with the planned procedure.   Delight Ovens MD  Beeper [phone] Office [phone] 10/21/2012 7:30 AM

## 2012-10-21 NOTE — Anesthesia Procedure Notes (Addendum)
Procedure Name: Intubation Date/Time: 10/21/2012 7:37 AM Performed by: Carmela Rima Pre-anesthesia Checklist: Patient identified, Timeout performed, Emergency Drugs available, Suction available and Patient being monitored Patient Re-evaluated:Patient Re-evaluated prior to inductionOxygen Delivery Method: Circle system utilized Preoxygenation: Pre-oxygenation with 100% oxygen Intubation Type: IV induction Ventilation: Mask ventilation without difficulty Laryngoscope Size: Mac and 3 Grade View: Grade II Tube type: Oral Tube size: 8.5 mm Number of attempts: 1 Placement Confirmation: ETT inserted through vocal cords under direct vision,  positive ETCO2 and breath sounds checked- equal and bilateral Secured at: 21 cm Tube secured with: Tape Dental Injury: Teeth and Oropharynx as per pre-operative assessment    Procedure Name: Intubation Date/Time: 10/21/2012 8:15 AM Performed by: Carmela Rima Pre-anesthesia Checklist: Patient identified, Timeout performed, Emergency Drugs available, Suction available and Patient being monitored Patient Re-evaluated:Patient Re-evaluated prior to inductionOxygen Delivery Method: Circle system utilized Laryngoscope Size: Mac and 3 Grade View: Grade II Tube type: Oral tube removed. Prior to DLT placement. Endobronchial tube: Left, EBT position confirmed by fiberoptic bronchoscope and EBT position confirmed by auscultation and 37 Fr Number of attempts: 1 Placement Confirmation: ETT inserted through vocal cords under direct vision,  positive ETCO2 and breath sounds checked- equal and bilateral Tube secured with: Tape Dental Injury: Teeth and Oropharynx as per pre-operative assessment

## 2012-10-22 ENCOUNTER — Inpatient Hospital Stay (HOSPITAL_COMMUNITY): Payer: Medicare HMO

## 2012-10-22 LAB — TYPE AND SCREEN
ABO/RH(D): O NEG
Antibody Screen: NEGATIVE
Unit division: 0
Unit division: 0

## 2012-10-22 LAB — BASIC METABOLIC PANEL
BUN: 6 mg/dL (ref 6–23)
Chloride: 101 mEq/L (ref 96–112)
Glucose, Bld: 171 mg/dL — ABNORMAL HIGH (ref 70–99)
Potassium: 3.8 mEq/L (ref 3.5–5.1)

## 2012-10-22 LAB — CBC
Hemoglobin: 14.4 g/dL (ref 12.0–15.0)
MCH: 31.6 pg (ref 26.0–34.0)
MCHC: 34 g/dL (ref 30.0–36.0)
RDW: 14.7 % (ref 11.5–15.5)

## 2012-10-22 LAB — GLUCOSE, CAPILLARY
Glucose-Capillary: 206 mg/dL — ABNORMAL HIGH (ref 70–99)
Glucose-Capillary: 166 mg/dL — ABNORMAL HIGH (ref 70–99)
Glucose-Capillary: 148 mg/dL — ABNORMAL HIGH (ref 70–99)

## 2012-10-22 LAB — BLOOD GAS, ARTERIAL
Acid-Base Excess: 4.7 mmol/L — ABNORMAL HIGH (ref 0.0–2.0)
Bicarbonate: 30.6 mEq/L — ABNORMAL HIGH (ref 20.0–24.0)
Drawn by: 252031
O2 Content: 10 L/min
O2 Saturation: 93.4 %
pO2, Arterial: 69.7 mmHg — ABNORMAL LOW (ref 80.0–100.0)

## 2012-10-22 MED ORDER — GUAIFENESIN ER 600 MG PO TB12
1200.0000 mg | ORAL_TABLET | Freq: Two times a day (BID) | ORAL | Status: DC
  Administered 2012-10-22 – 2012-10-27 (×10): 1200 mg via ORAL
  Filled 2012-10-22 (×12): qty 2

## 2012-10-22 MED ORDER — INSULIN DETEMIR 100 UNIT/ML ~~LOC~~ SOLN
20.0000 [IU] | Freq: Every day | SUBCUTANEOUS | Status: DC
  Administered 2012-10-22 – 2012-10-27 (×6): 20 [IU] via SUBCUTANEOUS
  Filled 2012-10-22 (×7): qty 0.2

## 2012-10-22 MED ORDER — FUROSEMIDE 10 MG/ML IJ SOLN
40.0000 mg | Freq: Once | INTRAMUSCULAR | Status: AC
  Administered 2012-10-22: 40 mg via INTRAVENOUS
  Filled 2012-10-22: qty 4

## 2012-10-22 MED ORDER — INSULIN ASPART 100 UNIT/ML ~~LOC~~ SOLN
0.0000 [IU] | SUBCUTANEOUS | Status: DC
  Administered 2012-10-22: 3 [IU] via SUBCUTANEOUS
  Administered 2012-10-22: 4 [IU] via SUBCUTANEOUS
  Administered 2012-10-22: 7 [IU] via SUBCUTANEOUS
  Administered 2012-10-23 (×2): 4 [IU] via SUBCUTANEOUS
  Administered 2012-10-23: 7 [IU] via SUBCUTANEOUS
  Administered 2012-10-23 (×3): 4 [IU] via SUBCUTANEOUS
  Administered 2012-10-23: 3 [IU] via SUBCUTANEOUS
  Administered 2012-10-24 (×2): 4 [IU] via SUBCUTANEOUS
  Administered 2012-10-24 (×2): 3 [IU] via SUBCUTANEOUS
  Administered 2012-10-24: 7 [IU] via SUBCUTANEOUS
  Administered 2012-10-24: 4 [IU] via SUBCUTANEOUS
  Administered 2012-10-25: 3 [IU] via SUBCUTANEOUS
  Administered 2012-10-25: 4 [IU] via SUBCUTANEOUS
  Administered 2012-10-25: 3 [IU] via SUBCUTANEOUS
  Administered 2012-10-25: 4 [IU] via SUBCUTANEOUS
  Administered 2012-10-26: 3 [IU] via SUBCUTANEOUS
  Administered 2012-10-26: 4 [IU] via SUBCUTANEOUS
  Administered 2012-10-26: 7 [IU] via SUBCUTANEOUS
  Administered 2012-10-26 – 2012-10-27 (×3): 3 [IU] via SUBCUTANEOUS

## 2012-10-22 MED ORDER — ENOXAPARIN SODIUM 40 MG/0.4ML ~~LOC~~ SOLN
40.0000 mg | Freq: Every day | SUBCUTANEOUS | Status: DC
  Administered 2012-10-22 – 2012-10-27 (×6): 40 mg via SUBCUTANEOUS
  Filled 2012-10-22 (×6): qty 0.4

## 2012-10-22 NOTE — Progress Notes (Signed)
Patient ID: Katelyn Cline, female   DOB: [birth year], 52 y.o.   MRN: [id number]  SICU Evening Rounds:  Hemodynamically stable sats 86% on 6L resting. Baseline severe COPD.  Will give her a dose of lasix. Flutter valve and guaifenesin.

## 2012-10-22 NOTE — Progress Notes (Signed)
1 Day Post-Op Procedure(s) (LRB): VIDEO ASSISTED THORACOSCOPY (VATS)/WEDGE RESECTION (Right) VIDEO BRONCHOSCOPY (N/A) Subjective:  No complaints  Objective: Vital signs in last 24 hours: Temp:  [97.4 F (36.3 C)-99 F (37.2 C)] 98.7 F (37.1 C) (04/26 1133) Pulse Rate:  [72-91] 84 (04/26 1200) Cardiac Rhythm:  [-] Normal sinus rhythm (04/26 0730) Resp:  [14-24] 24 (04/26 1200) BP: (106-137)/(41-69) 137/64 mmHg (04/26 1200) SpO2:  [82 %-96 %] 93 % (04/26 1200) Arterial Line BP: (139-193)/(60-92) 167/76 mmHg (04/26 1200) FiO2 (%):  [50 %-95 %] 95 % (04/26 0400) Weight:  [130.2 kg (287 lb 0.6 oz)] 130.2 kg (287 lb 0.6 oz) (04/26 0500)  Hemodynamic parameters for last 24 hours:    Intake/Output from previous day: 04/25 0701 - 04/26 0700 In: 4091.5 [P.O.:720; I.V.:2771.5; IV Piggyback:600] Out: 2895 [Urine:2405; Blood:50; Chest Tube:440] Intake/Output this shift: Total I/O In: 730 [P.O.:480; I.V.:250] Out: 875 [Urine:845; Chest Tube:30]  General appearance: alert and cooperative Heart: regular rate and rhythm, S1, S2 normal, no murmur, click, rub or gallop Lungs: clear to auscultation bilaterally no air leak from chest tube.  Lab Results:  Recent Labs  10/22/12 0500  WBC 14.4*  HGB 14.4  HCT 42.4  PLT 307   BMET:  Recent Labs  10/22/12 0500  NA 139  K 3.8  CL 101  CO2 31  GLUCOSE 171*  BUN 6  CREATININE 0.55  CALCIUM 8.9    PT/INR: No results found for this basename: LABPROT, INR,  in the last 72 hours ABG    Component Value Date/Time   PHART 7.309* 10/22/2012 0445   HCO3 30.6* 10/22/2012 0445   TCO2 32.5 10/22/2012 0445   O2SAT 93.4 10/22/2012 0445   CBG (last 3)  No results found for this basename: GLUCAP,  in the last 72 hours  *RADIOLOGY REPORT*  Clinical Data: Status post wedge resection in the right upper lobe  via video assisted thoracoscopy, revealing spindle cell carcinoma.  Postoperative day #1.  PORTABLE CHEST - 1 VIEW  Comparison:  10/21/2012  Findings: Suspected tiny right pneumothorax. Right chest tube in  place. Mild right pleural thickening. Density in the right mid  lung likely represents a small amount of hemorrhage adjacent to the  wedge resection site.  Right IJ line tip: SVC.  There is a band of opacity at the left lung base, probably in the  lingula. Mild linear opacity in the retrocardiac region on the  left. Low lung volumes. Mild cardiomegaly.  IMPRESSION:  1. Suspected tiny right-sided pneumothorax. Chest tube in place.  2. Right wedge resection with a small amount of airspace opacity  along the wedge resection site, probably representing surrounding  hemorrhage.  3. Left basilar bands of opacity favors atelectasis given the low  lung volumes; pneumonia less likely.  4. Mild cardiomegaly.  Original Report Authenticated By: Gaylyn Rong, M.D.    Assessment/Plan: S/P Procedure(s) (LRB): VIDEO ASSISTED THORACOSCOPY (VATS)/WEDGE RESECTION (Right) VIDEO BRONCHOSCOPY (N/A) Mobilize Diabetes control Work on IS and ambulation. CT to water seal   LOS: 1 day    Shayonna Ocampo K 10/22/2012

## 2012-10-22 NOTE — Plan of Care (Signed)
Problem: Phase II Progression Outcomes Goal: Weaning O2 for sats > or equal to 88% Outcome: Progressing MD states SpO2 sats >+ 86% ok.

## 2012-10-23 ENCOUNTER — Inpatient Hospital Stay (HOSPITAL_COMMUNITY): Payer: Medicare HMO

## 2012-10-23 LAB — COMPREHENSIVE METABOLIC PANEL
ALT: 20 U/L (ref 0–35)
AST: 18 U/L (ref 0–37)
Albumin: 3 g/dL — ABNORMAL LOW (ref 3.5–5.2)
Alkaline Phosphatase: 112 U/L (ref 39–117)
BUN: 7 mg/dL (ref 6–23)
Chloride: 94 mEq/L — ABNORMAL LOW (ref 96–112)
Potassium: 3.8 mEq/L (ref 3.5–5.1)
Sodium: 135 mEq/L (ref 135–145)
Total Bilirubin: 0.4 mg/dL (ref 0.3–1.2)

## 2012-10-23 LAB — CULTURE, RESPIRATORY W GRAM STAIN

## 2012-10-23 LAB — CBC
HCT: 39.8 % (ref 36.0–46.0)
RDW: 14.9 % (ref 11.5–15.5)
WBC: 14.6 10*3/uL — ABNORMAL HIGH (ref 4.0–10.5)

## 2012-10-23 LAB — GLUCOSE, CAPILLARY
Glucose-Capillary: 144 mg/dL — ABNORMAL HIGH (ref 70–99)
Glucose-Capillary: 169 mg/dL — ABNORMAL HIGH (ref 70–99)
Glucose-Capillary: 169 mg/dL — ABNORMAL HIGH (ref 70–99)
Glucose-Capillary: 176 mg/dL — ABNORMAL HIGH (ref 70–99)
Glucose-Capillary: 228 mg/dL — ABNORMAL HIGH (ref 70–99)

## 2012-10-23 MED ORDER — POTASSIUM CHLORIDE 10 MEQ/50ML IV SOLN
10.0000 meq | INTRAVENOUS | Status: AC
  Administered 2012-10-23 (×3): 10 meq via INTRAVENOUS
  Filled 2012-10-23 (×3): qty 50

## 2012-10-23 MED ORDER — FUROSEMIDE 10 MG/ML IJ SOLN
40.0000 mg | Freq: Once | INTRAMUSCULAR | Status: AC
  Administered 2012-10-23: 40 mg via INTRAVENOUS
  Filled 2012-10-23: qty 4

## 2012-10-23 NOTE — Progress Notes (Signed)
2 Days Post-Op Procedure(s) (LRB): VIDEO ASSISTED THORACOSCOPY (VATS)/WEDGE RESECTION (Right) VIDEO BRONCHOSCOPY (N/A) Subjective: No complaints. Ambulated well  Objective: Vital signs in last 24 hours: Temp:  [98.4 F (36.9 C)-98.9 F (37.2 C)] 98.8 F (37.1 C) (04/27 0752) Pulse Rate:  [75-102] 83 (04/27 0900) Cardiac Rhythm:  [-] Normal sinus rhythm (04/27 0800) Resp:  [16-24] 21 (04/27 0900) BP: (100-137)/(43-106) 124/106 mmHg (04/27 0900) SpO2:  [86 %-99 %] 95 % (04/27 0943) Arterial Line BP: (98-167)/(71-76) 127/71 mmHg (04/26 1400) FiO2 (%):  [86 %] 86 % (04/26 2000) Weight:  [126.735 kg (279 lb 6.4 oz)] 126.735 kg (279 lb 6.4 oz) (04/27 0600)  Hemodynamic parameters for last 24 hours:    Intake/Output from previous day: 04/26 0701 - 04/27 0700 In: 1726 [P.O.:480; I.V.:1246] Out: 3265 [Urine:3135; Chest Tube:130] Intake/Output this shift: Total I/O In: 119.5 [I.V.:119.5] Out: 110 [Urine:110]  General appearance: alert and cooperative Heart: regular rate and rhythm, S1, S2 normal, no murmur, click, rub or gallop Lungs: clear to auscultation bilaterally Wound: dressing dry No air leak from chest tube.  Lab Results:  Recent Labs  10/22/12 0500 10/23/12 0500  WBC 14.4* 14.6*  HGB 14.4 13.3  HCT 42.4 39.8  PLT 307 285   BMET:  Recent Labs  10/22/12 0500 10/23/12 0500  NA 139 135  K 3.8 3.8  CL 101 94*  CO2 31 37*  GLUCOSE 171* 175*  BUN 6 7  CREATININE 0.55 0.61  CALCIUM 8.9 9.1    PT/INR: No results found for this basename: LABPROT, INR,  in the last 72 hours ABG    Component Value Date/Time   PHART 7.309* 10/22/2012 0445   HCO3 30.6* 10/22/2012 0445   TCO2 32.5 10/22/2012 0445   O2SAT 93.4 10/22/2012 0445   CBG (last 3)   Recent Labs  10/23/12 0047 10/23/12 0424 10/23/12 0758  GLUCAP 176* 169* 144*   *RADIOLOGY REPORT*   Clinical Data: Follow-up right VATS and wedge resection.   CHEST - 1 VIEW   Comparison: 10/22/2012 and prior  exams.   Findings: A right IJ central venous catheter with tip overlying the upper SVC, and right thoracostomy tube again noted. Scattered subsegmental atelectasis within the mid and lower lungs bilaterally again noted. A tiny right lateral/apical pneumothorax (less than 5%) is again noted. There is no evidence of left pneumothorax. The cardiomediastinal silhouette is stable.   IMPRESSION: Stable chest radiograph with postoperative changes, atelectasis, and tiny right lateral/apical pneumothorax.     Original Report Authenticated By: Harmon Pier, M.D.    Assessment/Plan: S/P Procedure(s) (LRB): VIDEO ASSISTED THORACOSCOPY (VATS)/WEDGE RESECTION (Right) VIDEO BRONCHOSCOPY (N/A) Mobilize Diabetes control DC chest tube Transition to oral pain meds: hx of chronic pain   LOS: 2 days    Kayron Hicklin K 10/23/2012

## 2012-10-23 NOTE — Progress Notes (Signed)
Patient ID: Katelyn Cline, female   DOB: June 14, 1961, 51 y.o.   MRN: [id number]  SICU Evening Rounds:  Hemodynamically stable No complaints CT pulled today. CXR afterwards shows not ptx. There is still some edema. Will give another dose of lasix tonight.

## 2012-10-24 ENCOUNTER — Inpatient Hospital Stay (HOSPITAL_COMMUNITY): Payer: Medicare HMO

## 2012-10-24 ENCOUNTER — Encounter (HOSPITAL_COMMUNITY): Payer: Self-pay | Admitting: Cardiothoracic Surgery

## 2012-10-24 LAB — GLUCOSE, CAPILLARY
Glucose-Capillary: 124 mg/dL — ABNORMAL HIGH (ref 70–99)
Glucose-Capillary: 138 mg/dL — ABNORMAL HIGH (ref 70–99)
Glucose-Capillary: 172 mg/dL — ABNORMAL HIGH (ref 70–99)
Glucose-Capillary: 178 mg/dL — ABNORMAL HIGH (ref 70–99)
Glucose-Capillary: 180 mg/dL — ABNORMAL HIGH (ref 70–99)
Glucose-Capillary: 182 mg/dL — ABNORMAL HIGH (ref 70–99)
Glucose-Capillary: 184 mg/dL — ABNORMAL HIGH (ref 70–99)
Glucose-Capillary: 215 mg/dL — ABNORMAL HIGH (ref 70–99)

## 2012-10-24 MED ORDER — FUROSEMIDE 40 MG PO TABS
40.0000 mg | ORAL_TABLET | Freq: Every day | ORAL | Status: DC
  Administered 2012-10-24 – 2012-10-27 (×4): 40 mg via ORAL
  Filled 2012-10-24 (×5): qty 1

## 2012-10-24 NOTE — Progress Notes (Signed)
Inpatient Diabetes Program Recommendations  AACE/ADA: New Consensus Statement on Inpatient Glycemic Control (2013)  Target Ranges:  Prepandial:   less than 140 mg/dL      Peak postprandial:   less than 180 mg/dL (1-2 hours)      Critically ill patients:  140 - 180 mg/dL  Pt with elevated cbg's and using resistant correction, but documentation of personal history of diabetes is only noted in one summary under systems: Endocrine-Diabetes. Please document DM 2 on inhospital active problem list. There is no documentation of home dm medications. Please clarify. Thank you, Lenor Coffin, RN, CNS, Diabetes Coordinator [phone])

## 2012-10-24 NOTE — Progress Notes (Signed)
Wasted 400 mcg fentanyl down drain and flushed down drain when pca d/ced. Witnessed by Rexene Alberts, RN.

## 2012-10-24 NOTE — Progress Notes (Addendum)
Patient ID: Katelyn Cline, female   DOB: Apr 02, 1961, 52 y.o.   MRN: [id number] [facility] DAILY PROGRESS NOTE                   [address]            [place] [address]          [phone]      3 Days Post-Op Procedure(s) (LRB): VIDEO ASSISTED THORACOSCOPY (VATS)/WEDGE RESECTION (Right) VIDEO BRONCHOSCOPY (N/A)  Total Length of Stay:  LOS: 3 days   Subjective: Up to chair, no resp distress, walking in unit on o2  Objective: Vital signs in last 24 hours: Temp:  [98.4 F (36.9 C)-98.8 F (37.1 C)] 98.5 F (36.9 C) (04/28 0342) Pulse Rate:  [76-94] 86 (04/28 0600) Cardiac Rhythm:  [-] Normal sinus rhythm (04/28 0600) Resp:  [15-22] 18 (04/28 0600) BP: (101-150)/(45-106) 133/66 mmHg (04/28 0000) SpO2:  [6 %-96 %] 92 % (04/28 0742) FiO2 (%):  [92 %] 92 % (04/27 1435)  Filed Weights   10/22/12 0500 10/23/12 0600  Weight: 287 lb 0.6 oz (130.2 kg) 279 lb 6.4 oz (126.735 kg)    Weight change:    Hemodynamic parameters for last 24 hours:    Intake/Output from previous day: 04/27 0701 - 04/28 0700 In: 2438 [P.O.:1680; I.V.:608; IV Piggyback:150] Out: 2145 [Urine:2145]  Intake/Output this shift:    Current Meds: Scheduled Meds: . amitriptyline  25 mg Oral QHS  . amLODipine  5 mg Oral Daily  . atorvastatin  40 mg Oral q1800  . bisacodyl  10 mg Oral Daily  . buPROPion  200 mg Oral BID  . cholecalciferol  2,000 Units Oral Daily  . DULoxetine  60 mg Oral Daily  . enoxaparin (LOVENOX) injection  40 mg Subcutaneous Daily  . fentaNYL   Intravenous Q4H  . gabapentin  100 mg Oral QID  . guaiFENesin  1,200 mg Oral BID  . insulin aspart  0-20 Units Subcutaneous Q4H  . insulin detemir  20 Units Subcutaneous Daily  . levalbuterol  0.63 mg Nebulization Q6H  . modafinil  200 mg Oral Daily  . mupirocin ointment   Nasal BID  . triamterene-hydrochlorothiazide  1 each Oral Daily   Continuous Infusions: . dextrose 5 % and 0.45% NaCl 20 mL/hr at 10/23/12 1601    PRN Meds:.diphenhydrAMINE, diphenhydrAMINE, naloxone, ondansetron (ZOFRAN) IV, oxyCODONE-acetaminophen, potassium chloride, sodium chloride  General appearance: alert, appears stated age and no distress Neurologic: intact Heart: regular rate and rhythm, S1, S2 normal, no murmur, click, rub or gallop and normal apical impulse Lungs: diminished breath sounds bilaterally Abdomen: soft, non-tender; bowel sounds normal; no masses,  no organomegaly Extremities: extremities normal, atraumatic, no cyanosis or edema and Homans sign is negative, no sign of DVT Wound: chest tubes out  Lab Results: CBC: Recent Labs  10/22/12 0500 10/23/12 0500  WBC 14.4* 14.6*  HGB 14.4 13.3  HCT 42.4 39.8  PLT 307 285   BMET:  Recent Labs  10/22/12 0500 10/23/12 0500  NA 139 135  K 3.8 3.8  CL 101 94*  CO2 31 37*  GLUCOSE 171* 175*  BUN 6 7  CREATININE 0.55 0.61  CALCIUM 8.9 9.1    PT/INR: No results found for this basename: LABPROT, INR,  in the last 72 hours Radiology: Dg Chest 1 View  10/23/2012  *RADIOLOGY REPORT*  Clinical Data: Follow-up right VATS and wedge resection.  CHEST - 1 VIEW  Comparison: 10/22/2012 and prior exams.  Findings: A right IJ central venous catheter with tip overlying the upper SVC, and right thoracostomy tube again noted. Scattered subsegmental atelectasis within the mid and lower lungs bilaterally again noted. A tiny right lateral/apical pneumothorax (less than 5%) is again noted. There is no evidence of left pneumothorax. The cardiomediastinal silhouette is stable.  IMPRESSION: Stable chest radiograph with postoperative changes, atelectasis, and tiny right lateral/apical pneumothorax.   Original Report Authenticated By: Harmon Pier, M.D.    Dg Chest Port 1 View  10/23/2012  *RADIOLOGY REPORT*  Clinical Data: Chest tube removal  PORTABLE CHEST - 1 VIEW  Comparison: 10/23/2012  Findings: Right chest tube has been removed.  No pneumothorax identified.  Right jugular catheter  tip in the proximal SVC unchanged.  Diffuse bilateral airspace disease is unchanged and may represent pulmonary edema. Small right effusion.  IMPRESSION: No pneumothorax post chest tube removal on the right.  Bilateral airspace disease is unchanged, probable pulmonary edema.   Original Report Authenticated By: Janeece Riggers, M.D.    Dg Chest Port 1 View  10/22/2012  *RADIOLOGY REPORT*  Clinical Data: Status post wedge resection in the right upper lobe via video assisted thoracoscopy, revealing spindle cell carcinoma. Postoperative day #1.  PORTABLE CHEST - 1 VIEW  Comparison: 10/21/2012  Findings: Suspected tiny right pneumothorax.  Right chest tube in place.  Mild right pleural thickening.  Density in the right mid lung likely represents a small amount of hemorrhage adjacent to the wedge resection site.  Right IJ line tip:  SVC.  There is a band of opacity at the left lung base, probably in the lingula.  Mild linear opacity in the retrocardiac region on the left.  Low lung volumes.  Mild cardiomegaly.  IMPRESSION:  1.  Suspected tiny right-sided pneumothorax.  Chest tube in place. 2.  Right wedge resection with a small amount of airspace opacity along the wedge resection site, probably representing surrounding hemorrhage. 3.  Left basilar bands of opacity favors atelectasis given the low lung volumes; pneumonia less likely. 4.  Mild cardiomegaly.   Original Report Authenticated By: Gaylyn Rong, M.D.      Assessment/Plan: S/P Procedure(s) (LRB): VIDEO ASSISTED THORACOSCOPY (VATS)/WEDGE RESECTION (Right) VIDEO BRONCHOSCOPY (N/A) Mobilize Diuresis Plan for transfer to step-down: see transfer orders D/c on q and pca To 3300 Home when ambulating better, may need home o2 Central line pulled almost out on exam and on xray    Katelyn Cline B 10/24/2012 7:46 AM  Stable day, ambulated but still needs o2 and desats with exercise subsegmental atelectasis bilaterial. A tiny right lateral/apical  pneumothorax (less than 5%) is again noted. I have seen and examined Katelyn Cline and generated  the above assessment  and plan.  Katelyn Ovens MD Beeper [phone] Office [phone] 10/24/2012 5:22 PM

## 2012-10-24 NOTE — Clinical Documentation Improvement (Signed)
GENERIC DOCUMENTATION CLARIFICATION QUERY  THIS DOCUMENT IS NOT A PERMANENT PART OF THE MEDICAL RECORD  TO RESPOND TO THE THIS QUERY, FOLLOW THE INSTRUCTIONS BELOW:  1. If needed, update documentation for the patient's encounter via the notes activity.  2. Access this query again and click edit on the In [employer].  3. After updating, or not, click F2 to complete all highlighted (required) fields concerning your review. Select "additional documentation in the medical record" OR "no additional documentation provided".  4. Click Sign note button.  5. The deficiency will fall out of your In Basket *Please let us know if you are not able to complete this workflow by phone or e-mail (listed below).  Please update your documentation within the medical record to reflect your response to this query.                                                                                        10/24/12   Dear Dr. Tyrone Sage / [facility],  In a better effort to capture your patient's severity of illness, reflect appropriate length of stay and utilization of resources, a review of the patient medical record has revealed the following indicators.    Based on your clinical judgment, please clarify and document in a progress note and/or discharge summary the clinical condition associated with the following supporting information:  In responding to this query please exercise your independent judgment.  The fact that a query is asked, does not imply that any particular answer is desired or expected.   Possible Clinical Conditions?  ___yes___Atelectasis   _____yes__Apical Pneumothorax  _______Right Effusion  _______Other Condition  _______Cannot Clinically Determine      Diagnostics: 4/27: CXR results: Post op changes: Atelectasis, tiny right lateral Apical Pneumothorax, small right Effusion.  Treatment: 4/25:  Xopenex nebulizers q6hr. 4/27:  Per doc flow sheets:  Incentive spirometry and  Flutter valve, O2 @ 6L n/c.   You may use possible, probable, or suspect with inpatient documentation. possible, probable, suspected diagnoses MUST be documented at the time of discharge  Reviewed: additional documentation in the medical record  Thank You,  Heywood Footman, BSN,  Clinical Documentation Specialist:  Phone: [phone]  Health Information Management [place]

## 2012-10-24 NOTE — Plan of Care (Signed)
Problem: Phase II Progression Outcomes Goal: Weaning O2 for sats > or equal to 88% Outcome: Progressing Still wearing 6 L. sats down to 82 with ambulation.

## 2012-10-25 ENCOUNTER — Inpatient Hospital Stay (HOSPITAL_COMMUNITY): Payer: Medicare HMO

## 2012-10-25 LAB — CBC
HCT: 37.4 % (ref 36.0–46.0)
Hemoglobin: 12.3 g/dL (ref 12.0–15.0)
MCH: 30.8 pg (ref 26.0–34.0)
MCHC: 32.9 g/dL (ref 30.0–36.0)
MCV: 93.5 fL (ref 78.0–100.0)
Platelets: 273 10*3/uL (ref 150–400)
RBC: 4 MIL/uL (ref 3.87–5.11)
RDW: 14.3 % (ref 11.5–15.5)
WBC: 9.5 10*3/uL (ref 4.0–10.5)

## 2012-10-25 LAB — HEMOGLOBIN A1C
Hgb A1c MFr Bld: 6.1 % — ABNORMAL HIGH (ref <5.7)
Mean Plasma Glucose: 128 mg/dL — ABNORMAL HIGH (ref <117)

## 2012-10-25 LAB — BASIC METABOLIC PANEL
BUN: 13 mg/dL (ref 6–23)
CO2: 39 mEq/L — ABNORMAL HIGH (ref 19–32)
Calcium: 9.3 mg/dL (ref 8.4–10.5)
Chloride: 94 mEq/L — ABNORMAL LOW (ref 96–112)
Creatinine, Ser: 0.6 mg/dL (ref 0.50–1.10)
GFR calc Af Amer: >90 mL/min (ref >90)
GFR calc non Af Amer: >90 mL/min (ref >90)
Glucose, Bld: 134 mg/dL — ABNORMAL HIGH (ref 70–99)
Potassium: 4 mEq/L (ref 3.5–5.1)
Sodium: 137 mEq/L (ref 135–145)

## 2012-10-25 LAB — GLUCOSE, CAPILLARY
Glucose-Capillary: 127 mg/dL — ABNORMAL HIGH (ref 70–99)
Glucose-Capillary: 137 mg/dL — ABNORMAL HIGH (ref 70–99)
Glucose-Capillary: 194 mg/dL — ABNORMAL HIGH (ref 70–99)
Glucose-Capillary: 181 mg/dL — ABNORMAL HIGH (ref 70–99)

## 2012-10-25 NOTE — Progress Notes (Signed)
Patient ID: Katelyn Cline, female   DOB: 05-Dec-1960, 52 y.o.   MRN: [id number] T[facility] DAILY PROGRESS NOTE                   [address]            [place] [address]          [phone]      4 Days Post-Op Procedure(s) (LRB): VIDEO ASSISTED THORACOSCOPY (VATS)/WEDGE RESECTION (Right) VIDEO BRONCHOSCOPY (N/A)  Total Length of Stay:  LOS: 4 days   Subjective: Up to chair, ambulating better  Objective: Vital signs in last 24 hours: Temp:  [97.7 F (36.5 C)-98.6 F (37 C)] 97.9 F (36.6 C) (04/29 0400) Pulse Rate:  [65-85] 65 (04/29 0400) Cardiac Rhythm:  [-] Normal sinus rhythm (04/29 0400) Resp:  [11-26] 15 (04/29 0400) BP: (85-149)/(46-128) 112/68 mmHg (04/29 0400) SpO2:  [91 %-96 %] 95 % (04/29 0736) Weight:  [275 lb 2.2 oz (124.8 kg)] 275 lb 2.2 oz (124.8 kg) (04/29 0600)  Filed Weights   10/22/12 0500 10/23/12 0600 10/25/12 0600  Weight: 287 lb 0.6 oz (130.2 kg) 279 lb 6.4 oz (126.735 kg) 275 lb 2.2 oz (124.8 kg)    Weight change:    Hemodynamic parameters for last 24 hours:    Intake/Output from previous day: 04/28 0701 - 04/29 0700 In: 1700 [P.O.:1680; I.V.:20] Out: 3150 [Urine:3150]  Intake/Output this shift:    Current Meds: Scheduled Meds: . amitriptyline  25 mg Oral QHS  . amLODipine  5 mg Oral Daily  . atorvastatin  40 mg Oral q1800  . bisacodyl  10 mg Oral Daily  . buPROPion  200 mg Oral BID  . cholecalciferol  2,000 Units Oral Daily  . DULoxetine  60 mg Oral Daily  . enoxaparin (LOVENOX) injection  40 mg Subcutaneous Daily  . furosemide  40 mg Oral Daily  . gabapentin  100 mg Oral QID  . guaiFENesin  1,200 mg Oral BID  . insulin aspart  0-20 Units Subcutaneous Q4H  . insulin detemir  20 Units Subcutaneous Daily  . levalbuterol  0.63 mg Nebulization Q6H  . modafinil  200 mg Oral Daily  . mupirocin ointment   Nasal BID  . triamterene-hydrochlorothiazide  1 each Oral Daily   Continuous Infusions: . dextrose 5 % and 0.45%  NaCl Stopped (10/24/12 0800)   PRN Meds:.oxyCODONE-acetaminophen, potassium chloride  General appearance: alert and cooperative Neurologic: intact Heart: regular rate and rhythm, S1, S2 normal, no murmur, click, rub or gallop and normal apical impulse Lungs: diminished breath sounds bibasilar Abdomen: soft, non-tender; bowel sounds normal; no masses,  no organomegaly Extremities: extremities normal, atraumatic, no cyanosis or edema and Homans sign is negative, no sign of DVT Wound: intact  Lab Results: CBC: Recent Labs  10/23/12 0500 10/25/12 0432  WBC 14.6* 9.5  HGB 13.3 12.3  HCT 39.8 37.4  PLT 285 273   BMET:  Recent Labs  10/23/12 0500 10/25/12 0432  NA 135 137  K 3.8 4.0  CL 94* 94*  CO2 37* 39*  GLUCOSE 175* 134*  BUN 7 13  CREATININE 0.61 0.60  CALCIUM 9.1 9.3    PT/INR: No results found for this basename: LABPROT, INR,  in the last 72 hours Radiology: Dg Chest 2 View  10/25/2012  *RADIOLOGY REPORT*  Clinical Data: Shortness of breath, congestion.  CHEST - 2 VIEW  Comparison: 10/24/2012  Findings: Patchy bilateral airspace disease, stable on the right and slightly increased in  the left base/lingula.  Heart is borderline in size.  No visible significant effusion.  No acute bony abnormality.  IMPRESSION: Patchy bilateral airspace disease, slightly increased in the left base.   Original Report Authenticated By: Charlett Nose, M.D.    Dg Chest Port 1 View  10/24/2012  *RADIOLOGY REPORT*  Clinical Data: Post wedge resection of the right upper lobe  PORTABLE CHEST - 1 VIEW  Comparison: 10/23/2012; 10/19/2012; chest CT - 08/25/2012  Findings:  Grossly unchanged enlarged cardiac silhouette and mediastinal contours.  Heterogeneous air space opacities within the right mid lung are grossly unchanged. Tiny right-sided hydropneumothorax is suspected.  Grossly unchanged left basilar heterogeneous opacities. The pulmonary vasculature is indistinct with cephalization of flow. Unchanged  bones.  IMPRESSION: 1.  Suspected tiny apical right-sided hydropneumothorax post recent chest tube removal.  Continued attention follow-up is recommended. 2.  Stable postsurgical changes of the right lung with associated atelectasis/contusion. 3.  Pulmonary venous congestion without frank evidence of edema.   Original Report Authenticated By: Tacey Ruiz, MD    Dg Chest Port 1 View  10/23/2012  *RADIOLOGY REPORT*  Clinical Data: Chest tube removal  PORTABLE CHEST - 1 VIEW  Comparison: 10/23/2012  Findings: Right chest tube has been removed.  No pneumothorax identified.  Right jugular catheter tip in the proximal SVC unchanged.  Diffuse bilateral airspace disease is unchanged and may represent pulmonary edema. Small right effusion.  IMPRESSION: No pneumothorax post chest tube removal on the right.  Bilateral airspace disease is unchanged, probable pulmonary edema.   Original Report Authenticated By: Janeece Riggers, M.D.      Assessment/Plan: S/P Procedure(s) (LRB): VIDEO ASSISTED THORACOSCOPY (VATS)/WEDGE RESECTION (Right) VIDEO BRONCHOSCOPY (N/A) Mobilize Diuresis Plan for transfer to step-down: see transfer orders No history of DM preop, patient never told of DX and on no preop DM meds, will check HgA1c Waiting now 24 hours for step down bed   Towanna Avery B 10/25/2012 7:50 AM

## 2012-10-25 NOTE — Op Note (Signed)
Katelyn Cline, BOVEY NO.:  [id number]  MEDICAL RECORD NO.:  [id number]  LOCATION:  2316                         FACILITY:  [facility]  PHYSICIAN:  Sheliah Plane, MD    DATE OF BIRTH:  October 19, 1960  DATE OF PROCEDURE:  10/21/2012 DATE OF DISCHARGE:                              OPERATIVE REPORT   PREOPERATIVE DIAGNOSIS:  Right upper lobe lung mass.  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  PROCEDURE PERFORMED:  Bronchoscopy, right video-assisted thoracoscopy, mini thoracotomy, wedge resection, right upper lobe lung nodule.  SURGEON:  Sheliah Plane, MD  FIRST ASSISTANT:  Lowella Dandy, PA-C  BRIEF HISTORY:  The patient is a white female, who until 1 month ago was a long-term smoker, presented with cough and polycythemia to Dr. Mariel Sleet.  She was worked up for polycythemia and was at most genetic cause for polycythemia and also with her marginal lung function secondary to polycythemia due to smoking.  She started a series of phlebotomy with the hematocrit dropping between 41 and 43%.  In addition, further workup including chest x-ray led to CT scan of the chest and PET scan showing approximately 1.6 cm lobulated nodule in the right upper lobe with slightly elevated SUV without evidence of any nodal metastasis.  The patient was seen and at first was reluctant to proceed with any resection.  She did stay off cigarettes for month and returned willing to proceed with surgical resection.  Pulmonary function studies were performed and were at the adequate FEV1 for lung resection with a marginal diffusion capacity.  Her ABGs were also demonstrated some degree of CO2 retention and chronic hypoxia, though she remained very active without significant symptoms.  The risks and options were discussed with her and she was agreed with proceeding.  DESCRIPTION OF PROCEDURE:  The patient was brought to the  operating room and after appropriate time-out was performed, she underwent general endotracheal anesthesia with a single-lumen endotracheal tube.  Through this endotracheal tube, a fiberoptic bronchoscope was passed to the subsegmental level in both the right and left mainstem bronchus without evidence of endobronchial lesions.  The scope was removed.  The patient was turned in the right lateral decubitus position with the right side up.  In the fourth intercostal space, midaxillary line, a small port incision was introduced into the right chest after the right lung was collapsed.  The patient had some emphysematous changes of the lung, though there were no large dominant flaps noted.  On initial examination, the nodule was not readily apparent on the surface of the lung.  The incision was expanded slightly and we were able to palpate the nodule in the after dissecting some adhesions along the minor fissure.  On palpation, the nodule was easily identified in the right upper lobe along the minor fissure.  Because of the patient's marginal diagnosis, we decided to proceed with wedge resection of the lung which was completed with a series of firings of purple stapler and with the what appeared grossly to be a clear margin.  The resected specimen was sent to Pathology.  Frozen section showed spindle cell tumor with clear  margins within the lymph node sampling and submitted the nodal tissue labeled by nodal station to Pathology for permanent section.  A single 28 chest tube was left through the port site.  The incision was then closed with interrupted 0 Vicryl, running 3-0 Vicryl, and the subcutaneous tissue, and a 3-0 subcuticular stitch of the skin edges. The lung reinflated nicely without air leak.  The patient was awakened and extubated in the operating room and transferred to the recovery room for further postop care.  Procedure performed should the bronchoscopy, right video-assisted  thoracoscopy, and right mini thoracotomy, which were with wedge resection of the right upper lobe and lymph node dissection and placement of On-Q device.  On-Q device was placed along the rib margin of the thoracotomy incision.  Attempts to place this in the subpleural space because of the patient's obesity was difficult.     Sheliah Plane, MD     EG/MEDQ  D:  10/24/2012  T:  10/25/2012  Job:  [id number]

## 2012-10-25 NOTE — Progress Notes (Signed)
Transferred to 3307 via wheelchair. Alert and oriented, comfortable. Report given to receiving RN prior to coming to the room. Hooked up to monitor. Receiving RN at bedside. No untoward event happened during transport.

## 2012-10-25 NOTE — Care Management Note (Signed)
    Page 1 of 2   10/27/2012     10:17:49 AM   CARE MANAGEMENT NOTE 10/27/2012  Patient:  Katelyn Cline, Katelyn Cline   Account Number:  [id number]  Date Initiated:  10/21/2012  Documentation initiated by:  Avie Arenas  Subjective/Objective Assessment:   Mini thoracotomy for lung lesion.     Action/Plan:   Anticipated DC Date:  10/27/2012   Anticipated DC Plan:  HOME/SELF CARE      DC Planning Services  CM consult      Choice offered to / List presented to:  C-1 Patient   DME arranged  [facility]      DME agency  [facility]        Status of service:  Completed, signed off Medicare Important Message given?   (If response is "NO", the following Medicare IM given date fields will be blank) Date Medicare IM given:   Date Additional Medicare IM given:    Discharge Disposition:  HOME/SELF CARE  Per UR Regulation:  Reviewed for med. necessity/level of care/duration of stay  If discussed at Long Length of Stay Meetings, dates discussed:   10/27/2012    Comments:  Contact:  [place], [place] [phone]   [phone] PC  Pis Dr. Hilma Favors in [place]  10/27/12- 1000- Donn Pierini RN, BSN [phone] Pt for d/c today with home 02- called [facility] to confirm delivery of 02- was told that 02 would be here by 12 noon- spoke with pt at bedside to explain home O2 arrangements with [facility] per her insurance contract for DME with [facility]- pt voiced understanding and agreement with arrangements - upon leaving room- [facility] arrived with tanks for home-.  10/26/12- 1530- Donn Pierini RN, BSN [phone] Orders for home 02- pt with [facility]  and they contract with [facility] for DME needs- orders for 02 faxed to [facility]- will f/u in am with [facility]- if pt ready for d/c.  10-25-12 1:30am Avie Arenas, RNBSN [phone] Son lives with her.  Does assist her.  Independent prior to admission.  Plans for discharge home with son.  May need oxygen - Does not know an agency to get from.  States she really  doesn't like people to come into her house or be there. Because of this at this point is not interested in any [facility] - CM will continue to follow for further needs.

## 2012-10-26 LAB — CBC
HCT: 39.6 % (ref 36.0–46.0)
Hemoglobin: 13.3 g/dL (ref 12.0–15.0)
MCH: 30.9 pg (ref 26.0–34.0)
MCHC: 33.6 g/dL (ref 30.0–36.0)
MCV: 91.9 fL (ref 78.0–100.0)
Platelets: 287 10*3/uL (ref 150–400)
RBC: 4.31 MIL/uL (ref 3.87–5.11)
RDW: 14.3 % (ref 11.5–15.5)
WBC: 8.1 10*3/uL (ref 4.0–10.5)

## 2012-10-26 LAB — GLUCOSE, CAPILLARY
Glucose-Capillary: 108 mg/dL — ABNORMAL HIGH (ref 70–99)
Glucose-Capillary: 117 mg/dL — ABNORMAL HIGH (ref 70–99)
Glucose-Capillary: 126 mg/dL — ABNORMAL HIGH (ref 70–99)
Glucose-Capillary: 135 mg/dL — ABNORMAL HIGH (ref 70–99)
Glucose-Capillary: 142 mg/dL — ABNORMAL HIGH (ref 70–99)
Glucose-Capillary: 142 mg/dL — ABNORMAL HIGH (ref 70–99)
Glucose-Capillary: 156 mg/dL — ABNORMAL HIGH (ref 70–99)
Glucose-Capillary: 169 mg/dL — ABNORMAL HIGH (ref 70–99)
Glucose-Capillary: 226 mg/dL — ABNORMAL HIGH (ref 70–99)

## 2012-10-26 LAB — BASIC METABOLIC PANEL
BUN: 17 mg/dL (ref 6–23)
CO2: 38 mEq/L — ABNORMAL HIGH (ref 19–32)
Calcium: 9.7 mg/dL (ref 8.4–10.5)
Chloride: 94 mEq/L — ABNORMAL LOW (ref 96–112)
Creatinine, Ser: 0.73 mg/dL (ref 0.50–1.10)
GFR calc Af Amer: >90 mL/min (ref >90)
GFR calc non Af Amer: >90 mL/min (ref >90)
Glucose, Bld: 135 mg/dL — ABNORMAL HIGH (ref 70–99)
Potassium: 3.7 mEq/L (ref 3.5–5.1)
Sodium: 138 mEq/L (ref 135–145)

## 2012-10-26 NOTE — Discharge Summary (Signed)
[address]            [place] [address]          [phone]         Discharge Summary  Name: Katelyn Cline DOB: 16-Dec-1960 52 y.o. MRN: [id number]   Admission Date: 10/21/2012 Discharge Date: 10/27/2012    Admitting Diagnosis: Right upper lobe lung nodule   Discharge Diagnosis:  Spindle cell tumor, right upper lobe Hyperglycemia  Past Medical History  Diagnosis Date  . Narcolepsy   . HTN (hypertension)   . Fibromyalgia   . Scoliosis   . Migraines   . Major depression   . Panic attacks   . Agoraphobia   . Chronic pain   . Miscarriage     x 4  . Peripheral neuropathy   . Hyperlipidemia   . Basal cell carcinoma   . Elevated hemoglobin 2014  . Vertigo   . Polycythemia secondary to smoking   . Polycythemia vera   . Dysrhythmia     Hx: of palpitations "a long time ago"  . Shortness of breath     Hx: of with activity  . Sleep apnea      Procedures:  RIGHT VIDEO ASSISTED THORACOSCOPY/MINI-THORACOTOMY, RIGHT UPPER LOBE WEDGE RESECTION  VIDEO BRONCHOSCOPY on 10/21/2012   HPI:  The patient is a 52 y.o. female with a long history of smoking who was recently referred to Dr. Mariel Sleet for evaluation of polycythemia vera.  In the course of her workup, she underwent a CT of the chest and abdomen, and this revealed a right upper lobe lung nodule.  A PET scan confirmed a 12 mm nodule in the right upper lobe with an SUV of 3.0, suspicious for malignancy.  She was referred to Dr. Tyrone Sage in March 2014 for thoracic surgery consultation.  She was initially not anxious to proceed with surgery, so she was scheduled to be followed with a repeat CT in 3 months.  However, after further consideration, the patient decided to proceed with surgical resection.  All risks, benefits and alternatives of surgery were explained in detail, and the patient agreed to proceed.    Hospital Course:  The patient was admitted to [facility] on 10/21/2012. The  patient was taken to the operating room and underwent the above procedure.    The postoperative course has generally been uneventful.  Her chest tubes were removed in the standard fashion, and chest x-rays have been stable.  She has been noted to have elevated blood sugars, and has required therapy with Levemir and sliding scale insulin.  She has no history of diabetes, and hemoglobin A1C was 6.1.  It is felt that she will require outpatient management by her medical doctor.  Final pathology remains pending, however, preliminary reports show spindle cell tumor, and the specimen is undergoing further studies.  The patient continues to require 3-4 liters of supplemental oxygen to maintain sats of greater than 90%.  She will need short term home oxygen therapy, and this has been arranged.  She has been evaluated on today's date and is ready for discharge home.    Recent vital signs:  Filed Vitals:   10/26/12 0719  BP:   Pulse:   Temp: 97.9 F (36.6 C)  Resp:     Recent laboratory studies:  CBC: Recent Labs  10/25/12 0432 10/26/12 0405  WBC 9.5 8.1  HGB 12.3  13.3  HCT 37.4 39.6  PLT 273 287   BMET:  Recent Labs  10/25/12 0432 10/26/12 0405  NA 137 138  K 4.0 3.7  CL 94* 94*  CO2 39* 38*  GLUCOSE 134* 135*  BUN 13 17  CREATININE 0.60 0.73  CALCIUM 9.3 9.7    PT/INR: No results found for this basename: LABPROT, INR,  in the last 72 hours   Discharge Medications:     Medication List    TAKE these medications       amitriptyline 25 MG tablet  Commonly known as:  ELAVIL  Take 25 mg by mouth at bedtime.     amLODipine 5 MG tablet  Commonly known as:  NORVASC  Take 5 mg by mouth daily.     aspirin-acetaminophen-caffeine 250-250-65 MG per tablet  Commonly known as:  EXCEDRIN MIGRAINE  Take 1 tablet by mouth every 6 (six) hours as needed for pain.     atorvastatin 40 MG tablet  Commonly known as:  LIPITOR  Take 40 mg by mouth daily.     buPROPion 200 MG 12 hr  tablet  Commonly known as:  WELLBUTRIN SR  Take 200 mg by mouth 2 (two) times daily.     DULoxetine 60 MG capsule  Commonly known as:  CYMBALTA  Take 60 mg by mouth daily.     fish oil-omega-3 fatty acids 1000 MG capsule  Take 2 g by mouth 2 (two) times daily.     gabapentin 100 MG capsule  Commonly known as:  NEURONTIN  Take 100 mg by mouth 4 (four) times daily.     ibuprofen 800 MG tablet  Commonly known as:  ADVIL,MOTRIN  Take 800 mg by mouth every 8 (eight) hours as needed for pain.     modafinil 200 MG tablet  Commonly known as:  PROVIGIL  Take 200 mg by mouth daily.     oxyCODONE-acetaminophen 5-325 MG per tablet  Commonly known as:  PERCOCET/ROXICET  Take 1-2 tablets by mouth every 4 (four) hours as needed for pain.     triamterene-hydrochlorothiazide 37.5-25 MG per capsule  Commonly known as:  DYAZIDE  Take 1 capsule by mouth every morning.     Vitamin D 2000 UNITS Caps  Take 1 capsule by mouth daily.        Discharge Instructions:  The patient is to refrain from driving, heavy lifting or strenuous activity.  May shower daily and clean incisions with soap and water.  May resume regular diet.   Follow Up:       Future Appointments Provider Department Dept Phone   11/08/2012 2:00 PM Randall An, MD [facility] [phone]   12/02/2012 10:30 AM [facility] [phone]      Follow-up Information   Follow up with GERHARDT,EDWARD B, MD In 2 weeks. (Office will call to schedule appointment)    Contact information:   [address] [employer] [address] [place] [address] [phone]       Follow up with Randall An, MD. (Follow up as directed)    Contact information:   [address] [name] [address] [phone]       Schedule an appointment as soon as possible for a visit with [name], MD. (See your medical doctor for follow up of blood sugars)    Contact information:   [address] [place] [address] [phone]  Alma Muegge H 10/26/2012, 8:46 AM

## 2012-10-26 NOTE — Progress Notes (Addendum)
                    [address]            [place] [address]          [phone]     5 Days Post-Op Procedure(s) (LRB): VIDEO ASSISTED THORACOSCOPY (VATS)/WEDGE RESECTION (Right) VIDEO BRONCHOSCOPY (N/A)  Subjective: C/o constipation.  Feels well otherwise.  Breathing stable, although she remains on O2.  Objective: Vital signs in last 24 hours: Patient Vitals for the past 24 hrs:  BP Temp Temp src Pulse Resp SpO2  10/26/12 0719 - 97.9 F (36.6 C) Oral - - -  10/26/12 0340 116/42 mmHg 97.7 F (36.5 C) Oral 62 19 91 %  10/26/12 0224 - - - - - 90 %  10/25/12 2345 130/65 mmHg 97.8 F (36.6 C) Oral 69 12 92 %  10/25/12 2114 - - - - - 92 %  10/25/12 2000 128/60 mmHg 97.9 F (36.6 C) Oral 73 19 92 %  10/25/12 1649 114/55 mmHg 98.4 F (36.9 C) Oral 74 12 92 %  10/25/12 1203 128/95 mmHg 97.8 F (36.6 C) Oral 90 20 93 %  10/25/12 1129 117/64 mmHg - - 65 20 94 %  10/25/12 1007 95/56 mmHg - - - - -  10/25/12 0839 - 97.8 F (36.6 C) Oral - - -   Current Weight  10/25/12 275 lb 2.2 oz (124.8 kg)     Intake/Output from previous day: 04/29 0701 - 04/30 0700 In: 1080 [P.O.:1080] Out: 1900 [Urine:1900]  CBGs 194-181-135  PHYSICAL EXAM:  Heart: RRR Lungs: Slightly decreased BS on R Wound: Clean and dry    Lab Results: CBC: Recent Labs  10/25/12 0432 10/26/12 0405  WBC 9.5 8.1  HGB 12.3 13.3  HCT 37.4 39.6  PLT 273 287   BMET:  Recent Labs  10/25/12 0432 10/26/12 0405  NA 137 138  K 4.0 3.7  CL 94* 94*  CO2 39* 38*  GLUCOSE 134* 135*  BUN 13 17  CREATININE 0.60 0.73  CALCIUM 9.3 9.7    PT/INR: No results found for this basename: LABPROT, INR,  in the last 72 hours    Assessment/Plan: S/P Procedure(s) (LRB): VIDEO ASSISTED THORACOSCOPY (VATS)/WEDGE RESECTION (Right) VIDEO BRONCHOSCOPY (N/A)  Pulm- still requires 3-4 L of O2.  Will continue aggressive pulm toilet, wean O2 as tolerated.  May ultimately need home O2.  Elevated  CBGs, despite Levemir- HbA1C=6.1.  Not previously on meds. May need oral agent for home.  Will discuss with MD.  LOC today, ambulate.  No CXR ordered for today.  Will check in am.  Possibly ready for discharge in the next day or so +/- home O2.   LOS: 5 days    Cline,Katelyn H 10/26/2012  I have seen and examined Katelyn Cline and agree with the above assessment  and plan.  Delight Ovens MD Beeper [phone] Office [phone] 10/26/2012 6:12 PM

## 2012-10-26 NOTE — Progress Notes (Signed)
SATURATION QUALIFICATIONS: (This note is used to comply with regulatory documentation for home oxygen)  Patient Saturations on Room Air at Rest = 83%  Patient Saturations on Room Air while Ambulating = 78%  Patient Saturations on 2 Liters of oxygen while Ambulating = 93%  Please briefly explain why patient needs home oxygen: pt desats when ambulating in halls, reapplied oxygen to 2l/min via nasal cannula and sats increased to 93%. Beryle Quant

## 2012-10-27 ENCOUNTER — Inpatient Hospital Stay (HOSPITAL_COMMUNITY): Payer: Medicare HMO

## 2012-10-27 LAB — GLUCOSE, CAPILLARY
Glucose-Capillary: 130 mg/dL — ABNORMAL HIGH (ref 70–99)
Glucose-Capillary: 129 mg/dL — ABNORMAL HIGH (ref 70–99)
Glucose-Capillary: 132 mg/dL — ABNORMAL HIGH (ref 70–99)

## 2012-10-27 MED ORDER — LEVALBUTEROL HCL 0.63 MG/3ML IN NEBU: 0.6300 mg | INHALATION_SOLUTION | Freq: Four times a day (QID) | RESPIRATORY_TRACT | Status: DC | PRN

## 2012-10-27 MED ORDER — OXYCODONE-ACETAMINOPHEN 5-325 MG PO TABS: 1.0000 | ORAL_TABLET | ORAL | Status: DC | PRN

## 2012-10-27 NOTE — Progress Notes (Signed)
Utilization review completed.  

## 2012-10-27 NOTE — Progress Notes (Addendum)
                    [address]            , [address]          [phone]     6 Days Post-Op Procedure(s) (LRB): VIDEO ASSISTED THORACOSCOPY (VATS)/WEDGE RESECTION (Right) VIDEO BRONCHOSCOPY (N/A)  Subjective: Feels well, breathing stable.   Objective: Vital signs in last 24 hours: Patient Vitals for the past 24 hrs:  BP Temp Temp src Pulse Resp SpO2  10/27/12 0743 113/59 mmHg 98 F (36.7 C) Oral 66 10 94 %  10/27/12 0423 120/53 mmHg 97.6 F (36.4 C) Oral 62 18 91 %  10/27/12 0218 - - - - - 93 %  10/26/12 2340 113/69 mmHg 97.8 F (36.6 C) Oral 63 18 91 %  10/26/12 2008 - - - - - 94 %  10/26/12 1955 114/45 mmHg 98.1 F (36.7 C) Oral 68 16 91 %  10/26/12 1523 - 98.3 F (36.8 C) Oral - - -  10/26/12 1123 124/60 mmHg 97.9 F (36.6 C) Oral 71 - 95 %  10/26/12 0843 - - - - - 98 %   Current Weight  10/25/12 275 lb 2.2 oz (124.8 kg)     Intake/Output from previous day: 04/30 0701 - 05/01 0700 In: 240 [P.O.:240] Out: 1551 [Urine:1551]  CBGs 169-156-226-108   PHYSICAL EXAM:  Heart: RRR Lungs: Clear, slightly decreased in bases Wound: Clean and dry    Lab Results: CBC: Recent Labs  10/25/12 0432 10/26/12 0405  WBC 9.5 8.1  HGB 12.3 13.3  HCT 37.4 39.6  PLT 273 287   BMET:  Recent Labs  10/25/12 0432 10/26/12 0405  NA 137 138  K 4.0 3.7  CL 94* 94*  CO2 39* 38*  GLUCOSE 134* 135*  BUN 13 17  CREATININE 0.60 0.73  CALCIUM 9.3 9.7    PT/INR: No results found for this basename: LABPROT, INR,  in the last 72 hours   CXR: Findings: Cardiomediastinal silhouette appears normal. No pneumothorax is noted. No change is seen involving linear opacity involving the right upper lobe compared to prior exam consistent with pneumonia or atelectasis. No change is seen involving lobulated density seen laterally in the right lower lobe which may represent pneumonia, atelectasis or effusion. Left basilar opacity noted on prior exam is  slightly improved.  IMPRESSION:  Slightly improved left basilar opacity compared to prior exam.  Right lung opacities do not appear to be significantly changed  compared to prior exam.    Assessment/Plan: S/P Procedure(s) (LRB): VIDEO ASSISTED THORACOSCOPY (VATS)/WEDGE RESECTION (Right) VIDEO BRONCHOSCOPY (N/A) Stable overall. Still requires 2-3 L O2, therefore home O2 has been arranged. Discharge home today- pt to follow up with primary MD about blood sugars.   LOS: 6 days    COLLINS,GINA H 10/27/2012  Home today on O2 I have seen and examined Katelyn Cline and agree with the above assessment  and plan.  Delight Ovens MD Beeper [phone] Office [phone] 10/27/2012 8:46 AM

## 2012-10-27 NOTE — Progress Notes (Signed)
Pt discharged per MD order. All discharge instructions reviewed and all questions answered.  

## 2012-11-07 ENCOUNTER — Ambulatory Visit (HOSPITAL_COMMUNITY): Payer: Self-pay | Admitting: Psychiatry

## 2012-11-08 ENCOUNTER — Encounter (HOSPITAL_COMMUNITY): Payer: Self-pay | Admitting: Oncology

## 2012-11-08 ENCOUNTER — Encounter (HOSPITAL_COMMUNITY): Payer: Medicare HMO | Attending: Oncology | Admitting: Oncology

## 2012-11-08 DIAGNOSIS — D751 Secondary polycythemia: Secondary | ICD-10-CM

## 2012-11-08 DIAGNOSIS — C44319 Basal cell carcinoma of skin of other parts of face: Secondary | ICD-10-CM

## 2012-11-08 DIAGNOSIS — D45 Polycythemia vera: Secondary | ICD-10-CM | POA: Insufficient documentation

## 2012-11-08 DIAGNOSIS — R911 Solitary pulmonary nodule: Secondary | ICD-10-CM

## 2012-11-08 DIAGNOSIS — C439 Malignant melanoma of skin, unspecified: Secondary | ICD-10-CM

## 2012-11-08 LAB — CBC WITH DIFFERENTIAL/PLATELET
Basophils Absolute: 0 10*3/uL (ref 0.0–0.1)
Basophils Relative: 0 % (ref 0–1)
Eosinophils Absolute: 0.2 10*3/uL (ref 0.0–0.7)
HCT: 42.7 % (ref 36.0–46.0)
Hemoglobin: 14.5 g/dL (ref 12.0–15.0)
Lymphs Abs: 2.2 10*3/uL (ref 0.7–4.0)
MCHC: 34 g/dL (ref 30.0–36.0)
MCV: 89.9 fL (ref 78.0–100.0)
Neutro Abs: 6.6 10*3/uL (ref 1.7–7.7)
RDW: 14.1 % (ref 11.5–15.5)

## 2012-11-08 NOTE — Progress Notes (Signed)
Katelyn Cline presented for labwork. Labs per MD order drawn via Peripheral Line 23 gauge needle inserted in left hand  Good blood return present. Procedure without incident.  Needle removed intact. Patient tolerated procedure well.

## 2012-11-08 NOTE — Progress Notes (Signed)
#  1 right lung nodule thus far consistent with metastatic melanoma by special stains, BRAF mutation evaluation and will commence today . #2 erythrocytosis Jak-2 positive as well as with a significant element of secondary erythrocytosis from severe COPD and hypoxemia we will continue work on keeping her hemoglobin 15 g or less and hematocrit 45% or less. CBC from today is pending.  #3 basal cell carcinoma, pigmented, left-sided nose status post Mohs surgery by Dr. Charlotte Crumb with last week who is going to review the Mohs surgical slides to make sure there is no melanoma-like lesion present but I have not heard from him yet.  #4 COPD #5 PTSD #6 hyperlipidemia with xanthelasma #7 obesity #8 hypertension #9 multiple benign skin lesions including cherry angiomata and acrochordons.  After discussed with her the questionable issue of this being melanoma she mentioned a couple other moles that she had but one was benign appearing verrucous acrochordon in the right groin area. She did have a small unimpressive pigmented nevus right posterior thigh which did not look at all suspicious at this point in time. All other lesions appeared benign. Her scalp was unremarkable and she was not aware of sores that don't heal in her mouth etc. Her lymph nodes were negative the cervical, supraclavicular, infraclavicular and axillary areas. Her abdomen still was obese but without obvious hepatosplenomegaly at this juncture  We're therefore dealing with possible metastatic melanoma see if there's a BRAFmutation. I will see her back the Tuesday after Memorial Day.  She may be eligible potentially for protocol at [facility], [facility] etc.

## 2012-11-08 NOTE — Patient Instructions (Addendum)
[facility] Discharge Instructions  RECOMMENDATIONS MADE BY THE CONSULTANT AND ANY TEST RESULTS WILL BE SENT TO YOUR REFERRING PHYSICIAN.  EXAM FINDINGS BY THE PHYSICIAN TODAY AND SIGNS OR SYMPTOMS TO REPORT TO CLINIC OR PRIMARY PHYSICIAN: Exam and discussion by MD.  Will check your blood counts today to see if you need a phlebotomy.  Don't know the final pathology yet but will call you when we do know.  MEDICATIONS PRESCRIBED:  none  INSTRUCTIONS GIVEN AND DISCUSSED: Will call you with your next appointment      Thank you for choosing [facility] to provide your oncology and hematology care.  To afford each patient quality time with our providers, please arrive at least 15 minutes before your scheduled appointment time.  With your help, our goal is to use those 15 minutes to complete the necessary work-up to ensure our physicians have the information they need to help with your evaluation and healthcare recommendations.    Effective January 1st, 2014, we ask that you re-schedule your appointment with our physicians should you arrive 10 or more minutes late for your appointment.  We strive to give you quality time with our providers, and arriving late affects you and other patients whose appointments are after yours.    Again, thank you for choosing [facility].  Our hope is that these requests will decrease the amount of time that you wait before being seen by our physicians.       _____________________________________________________________  Should you have questions after your visit to [facility], please contact our office at [phone] between the hours of 8:30 a.m. and 5:00 p.m.  Voicemails left after 4:30 p.m. will not be returned until the following business day.  For prescription refill requests, have your pharmacy contact our office with your prescription refill request.

## 2012-11-09 ENCOUNTER — Other Ambulatory Visit: Payer: Self-pay | Admitting: *Deleted

## 2012-11-10 ENCOUNTER — Ambulatory Visit
Admission: RE | Admit: 2012-11-10 | Discharge: 2012-11-10 | Disposition: A | Discharge status: Home / Self Care | Payer: Medicare HMO | Source: Ambulatory Visit | Attending: Cardiothoracic Surgery | Admitting: Cardiothoracic Surgery

## 2012-11-10 ENCOUNTER — Other Ambulatory Visit: Payer: Self-pay | Admitting: *Deleted

## 2012-11-10 ENCOUNTER — Ambulatory Visit (INDEPENDENT_AMBULATORY_CARE_PROVIDER_SITE_OTHER): Payer: Self-pay | Admitting: Cardiothoracic Surgery

## 2012-11-10 ENCOUNTER — Encounter: Payer: Self-pay | Admitting: Cardiothoracic Surgery

## 2012-11-10 DIAGNOSIS — C3410 Malignant neoplasm of upper lobe, unspecified bronchus or lung: Secondary | ICD-10-CM

## 2012-11-10 DIAGNOSIS — Z09 Encounter for follow-up examination after completed treatment for conditions other than malignant neoplasm: Secondary | ICD-10-CM

## 2012-11-10 DIAGNOSIS — R911 Solitary pulmonary nodule: Secondary | ICD-10-CM

## 2012-11-10 MED ORDER — CEPHALEXIN 500 MG PO CAPS: 500.0000 mg | ORAL_CAPSULE | Freq: Three times a day (TID) | ORAL | Status: DC

## 2012-11-10 NOTE — Patient Instructions (Signed)
Start Keflex, for chest tube infection site  Return in 2 weeks

## 2012-11-10 NOTE — Progress Notes (Signed)
[address]            [place] [address]          [phone]       Ranada Vigorito [facility] Medical Record #[id number] Date of Birth: [birth year]  Dr Artemio Aly, MD  Chief Complaint:   PostOp Follow Up Visit 10/21/2012  PREOPERATIVE DIAGNOSIS: Right upper lobe lung mass.  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  PROCEDURE PERFORMED: Bronchoscopy, right video-assisted thoracoscopy,  mini thoracotomy, wedge resection, right upper lobe lung nodule.  SURGEON: Sheliah Plane, MD  PATH: Diagnosis 1. Lung, wedge biopsy/resection, Right upper lobe - SARCOMATOID MALIGNANCY, FAVOR MALIGNANT MELANOMA. SEE MICROSCOPIC DESCRIPTION AND OUTSIDE CONSULATION REPORT. 2. Lymph node, biopsy, 10 R - ANTHRACOTIC LYMPH NODE. NO TUMOR IDENTIFIED. 3. Lymph node, biopsy, 4 R - ANTHRACOTIC LYMPH NODE. NO TUMOR IDENTIFIED. Microscopic Comment 1. The tumor in the right upper lobe wedge specimen is 1.5 cm and is composed of spindle cells with marked nuclear atypia including occasional tumor giant cells and there are frequent mitotic figures present. Immunohistochemistry is performed and the tumor shows strong diffuse staining with S100 and NK1C3 and shows focal patchy positivity with p75. The tumor is negative with Melan-A, HMB45 and MART1 as well as desmin, muscle specific actin, CD117, estrogen receptor and progesterone receptor. Smooth muscle actin shows positivity in non-neoplastic stromal cells. All of the epithelial margins are negative including epithelial membrane antigen, Cytokeratin AE1 / AE3, Cytokeratin 5/6, Cytokeratin 8, Cytokeratin 20, Cytokeratin 7 and Cytokeratin 116. 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 The immunohistochemical findings strongly suggest  malignant melanoma; however the immunohistochemical findings are not definitive and other high grade sarcomatoid malignancies are also in the differential. Sarcomatoid carcinoma is unlikely given the negativity with all of the epithelial markers attempted. The margins of the wedge are not involved by tumor. Clinical correlation including clinical workup is suggested. Drs. Dierdre Searles and Trudee Kuster have reviewed this case and agree. Case discussed with Dr. Tyrone Sage and Dr. Mariel Sleet on 10/25/12 and 10/27/12. (JDP:caf 10/26/12) This case is sent to Dr. Marcene Brawn at [facility] for consultation. His letter is quoted as follow: "The lesion shows the appearance of a poorly differentiated malignant neoplasm composed of ovoid, spindled or focally epithelioid cells with amphophilic cytoplasm and plump vesicular nuclei showing variably prominent nucleoli There is focal striking nuclear pleomorphism. While immunostains for pan keratin, CAM 5.2, CD34, MDM2, CDK4 and ALK1 are negative, there is very strong and diffuse positivity for S100 protein and SOX10. Stromal non-neoplastic spindle cells are S&A positive. Taking the morphology with the immunophenotype, I believe that the appearances are undoubtably those of metastatic malignant melanoma. Ongoing clinical correlation to check for the presence of a primary lesion elsewhere or else a relevant prior history may be informative, although it is nowadays recognized that in as many as 10% of patients presenting with metastatic melanoma, the primary lesion may no longer be identified, possibly due to regression."  History of Present Illness:      Patient is doing reasonably well following surgery, she is still on oxygen. She has been increasing her physical activity appropriately, going outside and walking her dog several times a day without significant shortness of breath. She has had no  fever chills.     History  Smoking status  . Former Smoker  -- 2.00 packs/day for 5 years  . Types: Cigarettes  . Quit date: 09/16/2012  Smokeless tobacco  . Never Used       Allergies  Allergen Reactions  . Contrast Media (Iodinated Diagnostic Agents) Anaphylaxis  . Flagyl (Metronidazole)   . Penicillins   . Tetracyclines & Related     Current Outpatient Prescriptions  Medication Sig Dispense Refill  . amitriptyline (ELAVIL) 25 MG tablet Take 25 mg by mouth at bedtime.      Marland Kitchen amLODipine (NORVASC) 5 MG tablet Take 5 mg by mouth daily.      Marland Kitchen aspirin-acetaminophen-caffeine (EXCEDRIN MIGRAINE) 250-250-65 MG per tablet Take 1 tablet by mouth every 6 (six) hours as needed for pain.      Marland Kitchen atorvastatin (LIPITOR) 40 MG tablet Take 40 mg by mouth daily.      Marland Kitchen buPROPion (WELLBUTRIN SR) 200 MG 12 hr tablet Take 200 mg by mouth daily.       . Cholecalciferol (VITAMIN D) 2000 UNITS CAPS Take 1 capsule by mouth daily.      . DULoxetine (CYMBALTA) 60 MG capsule Take 60 mg by mouth daily.      . fish oil-omega-3 fatty acids 1000 MG capsule Take 2 g by mouth 2 (two) times daily.      Marland Kitchen gabapentin (NEURONTIN) 100 MG capsule Take 100 mg by mouth 4 (four) times daily.      Marland Kitchen ibuprofen (ADVIL,MOTRIN) 800 MG tablet Take 800 mg by mouth every 8 (eight) hours as needed for pain.      . modafinil (PROVIGIL) 200 MG tablet Take 200 mg by mouth as needed.       Marland Kitchen oxyCODONE-acetaminophen (PERCOCET/ROXICET) 5-325 MG per tablet Take 1-2 tablets by mouth every 4 (four) hours as needed for pain.  30 tablet  0  . triamterene-hydrochlorothiazide (DYAZIDE) 37.5-25 MG per capsule Take 1 capsule by mouth every morning.       No current facility-administered medications for this visit.       Physical Exam: There were no vitals taken for this visit.  General appearance: alert and cooperative Neurologic: intact Heart: regular rate and rhythm, S1, S2 normal, no murmur, click, rub or gallop and normal apical impulse Lungs: clear to auscultation bilaterally and normal  percussion bilaterally Abdomen: soft, non-tender; bowel sounds normal; no masses,  no organomegaly Extremities: extremities normal, atraumatic, no cyanosis or edema and Homans sign is negative, no sign of DVT Wound: incision is well healed. chest tube /port site os with erythemia. sutures are removed   Diagnostic Studies & Laboratory data:         Recent Radiology Findings: Dg Chest 2 View  11/10/2012   *RADIOLOGY REPORT*  Clinical Data: Status post VATS, short of breath, follow  CHEST - 2 VIEW  Comparison: Of the chest x-ray of 10/27/2012  Findings: Aeration of the right lung has improved.  Minimal opacity remains within the right mid lung most likely due to postoperative change and atelectasis with scarring.  Tiny pleural effusions remain.  The heart is stable in size.  IMPRESSION: Improved aeration with residual postoperative opacity in the right mid lung.  Tiny effusions.   Original Report Authenticated By: Dwyane Dee, M.D.      Recent Labs: Lab Results  Component Value Date   WBC 9.6 11/08/2012   HGB 14.5 11/08/2012   HCT 42.7 11/08/2012   PLT 329 11/08/2012  GLUCOSE 135* 10/26/2012   CHOL 195 06/09/2012   TRIG 359* 06/09/2012   HDL 34* 06/09/2012   LDLCALC 89 06/09/2012   ALT 20 10/23/2012   AST 18 10/23/2012   NA 138 10/26/2012   K 3.7 10/26/2012   CL 94* 10/26/2012   CREATININE 0.73 10/26/2012   BUN 17 10/26/2012   CO2 38* 10/26/2012   TSH 3.474 06/09/2012   INR 0.90 10/19/2012   HGBA1C 6.1* 10/25/2012      Assessment / Plan:    Question of chest tube site infection, start Keflex for 7 days  Patient is waiting for final path results, further pathologic consultation concerning the pathology report of melanoma. She's been back to see Dr. Mariel Sleet who has reviewed the pathologic situation with her in detail. I'll plan to see her back in 2 weeks for followup wound check.    Apolinar Bero B 11/10/2012 10:31 AM

## 2012-11-11 ENCOUNTER — Other Ambulatory Visit (HOSPITAL_COMMUNITY): Payer: Self-pay | Admitting: Oncology

## 2012-11-11 NOTE — Progress Notes (Signed)
I have talked to Katelyn Cline today about the fact that she has a metastatic melanoma lesion to the lung that has now been resected and that her PET scan showed no disease elsewhere. We do not have a primary but she has not had her eyes examined by the ophthalmologist in several years. She does not have any symptoms however.  I discussed with her my telephone conversation with Dr.Collichio at [facility] who is part of the melanoma clinic team there. The patient would be a candidate for ECOG study number 1609.  I told the patient that I think she would be a good candidate and I think it would pay her to at least be seen in consultation. If necessary and if she wants to go on the study then she could be seen there from an ophthalmology standpoint along with any of the tests that might need be done.  She will think things over and get back with [place] next week.

## 2012-11-17 ENCOUNTER — Telehealth (HOSPITAL_COMMUNITY): Payer: Self-pay

## 2012-11-17 NOTE — Telephone Encounter (Signed)
Patient is interested potentially participating in the study offered @ [facility].  She will discuss further with Dr. Mariel Sleet on Tuesday.

## 2012-11-18 ENCOUNTER — Other Ambulatory Visit: Payer: Self-pay | Admitting: *Deleted

## 2012-11-18 DIAGNOSIS — R911 Solitary pulmonary nodule: Secondary | ICD-10-CM

## 2012-11-22 ENCOUNTER — Other Ambulatory Visit (HOSPITAL_COMMUNITY)
Admission: RE | Admit: 2012-11-22 | Discharge: 2012-11-22 | Disposition: A | Discharge status: Home / Self Care | Payer: Medicare HMO | Source: Ambulatory Visit | Attending: Oncology | Admitting: Oncology

## 2012-11-22 ENCOUNTER — Encounter (HOSPITAL_BASED_OUTPATIENT_CLINIC_OR_DEPARTMENT_OTHER): Payer: Medicare HMO | Admitting: Oncology

## 2012-11-22 VITALS — BP 132/79 | HR 77 | Temp 97.8°F | Resp 18

## 2012-11-22 DIAGNOSIS — C3490 Malignant neoplasm of unspecified part of unspecified bronchus or lung: Secondary | ICD-10-CM | POA: Insufficient documentation

## 2012-11-22 DIAGNOSIS — C801 Malignant (primary) neoplasm, unspecified: Secondary | ICD-10-CM

## 2012-11-22 DIAGNOSIS — D751 Secondary polycythemia: Secondary | ICD-10-CM

## 2012-11-22 DIAGNOSIS — C7800 Secondary malignant neoplasm of unspecified lung: Secondary | ICD-10-CM

## 2012-11-22 DIAGNOSIS — D45 Polycythemia vera: Secondary | ICD-10-CM

## 2012-11-22 NOTE — Patient Instructions (Addendum)
[facility] Discharge Instructions  RECOMMENDATIONS MADE BY THE CONSULTANT AND ANY TEST RESULTS WILL BE SENT TO YOUR REFERRING PHYSICIAN.  EXAM FINDINGS BY THE PHYSICIAN TODAY AND SIGNS OR SYMPTOMS TO REPORT TO CLINIC OR PRIMARY PHYSICIAN: Discussion by MD.  We will get you set up to see Dr. Dan Europe in the Melanoma Clinic at [place].  We will call you with the date and time of the appointment.   MEDICATIONS PRESCRIBED:  none     SPECIAL INSTRUCTIONS/FOLLOW-UP: Return for labs as scheduled and to see PA in 6 weeks.  Thank you for choosing [facility] to provide your oncology and hematology care.  To afford each patient quality time with our providers, please arrive at least 15 minutes before your scheduled appointment time.  With your help, our goal is to use those 15 minutes to complete the necessary work-up to ensure our physicians have the information they need to help with your evaluation and healthcare recommendations.    Effective January 1st, 2014, we ask that you re-schedule your appointment with our physicians should you arrive 10 or more minutes late for your appointment.  We strive to give you quality time with our providers, and arriving late affects you and other patients whose appointments are after yours.    Again, thank you for choosing [facility].  Our hope is that these requests will decrease the amount of time that you wait before being seen by our physicians.       _____________________________________________________________  Should you have questions after your visit to [facility], please contact our office at [phone] between the hours of 8:30 a.m. and 5:00 p.m.  Voicemails left after 4:30 p.m. will not be returned until the following business day.  For prescription refill requests, have your pharmacy contact our office with your prescription refill request.

## 2012-11-22 NOTE — Progress Notes (Signed)
#  1 metastatic melanoma to the right lung status post resection, BRAF mutation pending  #2 polycythemia vera in conjunction with an element of secondary erythrocytosis. She is Jak-2 positive but also severe COPD with hypoxemia. We are going to keep her hemoglobin 15 g or less and her hematocrit 45% or less. Her next hemoglobin is due 12/02/2012  #3 COPD #4 PTSD #5 basal cell carcinoma, pigmented, left side of the nose status post Mohs surgery by Dr. Gery Pray intervention who called me within the last 2 weeks and said review of all the pathology slide shows no evidence for melanoma arising and left-sided nose lesion.  #6 vitamin D deficiency  I have done an extensive skin exam including vaginal/vulvar area and did not find the primary. She found another lesion on the top of her scalp which looks like a seborrheic keratosis 3-4 mm across right side of the midline slightly posteriorly which does not look malignant to me either.  She states that she has had lesions on her skin which have come and gone.  We are going to get her up with Dr. Dan Europe at [facility] since she does want to participate a national study of ipilumimab versus interferon if eligible.  She had some questions about her hemoglobin which I think is well-controlled now. She is still off cigarettes which is important. She still on oxygen most of the time but not all the time. She is accompanied by her therapy dog.  We will get her hemoglobin checked along with her vitamin D level checked on 12/02/2012. We'll get her the appointment at [facility] as it is possible.

## 2012-11-22 NOTE — Progress Notes (Signed)
Here to discuss entering study at [facility]

## 2012-11-25 ENCOUNTER — Other Ambulatory Visit: Payer: Self-pay | Admitting: *Deleted

## 2012-11-25 ENCOUNTER — Telehealth (HOSPITAL_COMMUNITY): Payer: Self-pay

## 2012-11-25 ENCOUNTER — Telehealth (HOSPITAL_COMMUNITY): Payer: Self-pay | Admitting: *Deleted

## 2012-11-25 DIAGNOSIS — R911 Solitary pulmonary nodule: Secondary | ICD-10-CM

## 2012-11-25 NOTE — Telephone Encounter (Signed)
Katelyn Cline has an appt with Dr. Roland Rack at [facility] on June 5th at 3:30 arrive at 3:00 and they are e-mailing her g-mail account we have on file with the directions and the appt time...she had not checked her e-mail account but she now knows to and the time of the appt ect......Marland Kitchen

## 2012-11-25 NOTE — Telephone Encounter (Signed)
Spoke with Katelyn Cline and she has not had eye exam to rule out melanoma.  Stated " don't know who to go to and would like to wait until after I'm seen at [facility].  Want to see if they will have someone I can see."

## 2012-11-28 ENCOUNTER — Ambulatory Visit (INDEPENDENT_AMBULATORY_CARE_PROVIDER_SITE_OTHER): Payer: Medicare HMO | Admitting: Physician Assistant

## 2012-11-28 ENCOUNTER — Ambulatory Visit
Admission: RE | Admit: 2012-11-28 | Discharge: 2012-11-28 | Disposition: A | Discharge status: Home / Self Care | Payer: Medicare HMO | Source: Ambulatory Visit | Attending: Cardiothoracic Surgery | Admitting: Cardiothoracic Surgery

## 2012-11-28 DIAGNOSIS — L0390 Cellulitis, unspecified: Secondary | ICD-10-CM

## 2012-11-28 DIAGNOSIS — Z09 Encounter for follow-up examination after completed treatment for conditions other than malignant neoplasm: Secondary | ICD-10-CM

## 2012-11-28 DIAGNOSIS — L0291 Cutaneous abscess, unspecified: Secondary | ICD-10-CM

## 2012-11-28 DIAGNOSIS — C3410 Malignant neoplasm of upper lobe, unspecified bronchus or lung: Secondary | ICD-10-CM

## 2012-11-28 DIAGNOSIS — R911 Solitary pulmonary nodule: Secondary | ICD-10-CM

## 2012-11-28 MED ORDER — OXYCODONE-ACETAMINOPHEN 5-325 MG PO TABS: 1.0000 | ORAL_TABLET | ORAL | Status: DC | PRN

## 2012-11-28 NOTE — Progress Notes (Signed)
[address]            [place] [address]          [phone]    CARDIAC SURGERY POSTOPERATIVE VISIT  Patient Name: Katelyn Cline MRN: [id number] DOB: 16-Aug-1960  Subjective: Jackelyne Sayer is a 52 y.o. female here s/p bronchoscopy, wedge right upper lobe on 10/21/2012. She presents today for right chest tube wound check. Patient states she was seen by Dr. Tyrone Sage on 5/15. Her chest tube sit appeared infected. She was placed on Keflex. She states It is now completely healed. She denies any fever or chills.   Past Medical History  Diagnosis Date  . Narcolepsy   . HTN (hypertension)   . Fibromyalgia   . Scoliosis   . Migraines   . Major depression   . Panic attacks   . Agoraphobia   . Chronic pain   . Miscarriage     x 4  . Peripheral neuropathy   . Hyperlipidemia   . Basal cell carcinoma   . Elevated hemoglobin 2014  . Vertigo   . Polycythemia secondary to smoking   . Polycythemia vera(238.4)   . Dysrhythmia     Hx: of palpitations "a long time ago"  . Shortness of breath     Hx: of with activity  . Sleep apnea    Prior to Admission medications   Medication Sig Start Date End Date Taking? Authorizing Provider  amitriptyline (ELAVIL) 25 MG tablet Take 25 mg by mouth at bedtime.   Yes Historical Provider, MD  amLODipine (NORVASC) 5 MG tablet Take 5 mg by mouth daily.   Yes Historical Provider, MD  aspirin-acetaminophen-caffeine (EXCEDRIN MIGRAINE) 406 700 3877 MG per tablet Take 1 tablet by mouth every 6 (six) hours as needed for pain.   Yes Historical Provider, MD  atorvastatin (LIPITOR) 40 MG tablet Take 40 mg by mouth daily.   Yes Historical Provider, MD  buPROPion (WELLBUTRIN SR) 200 MG 12 hr tablet Take 200 mg by mouth daily.    Yes Historical Provider, MD  Cholecalciferol (VITAMIN D) 2000 UNITS CAPS Take 1 capsule by mouth daily.   Yes Historical Provider, MD  DULoxetine (CYMBALTA) 60 MG capsule Take 60 mg by mouth daily.   Yes  Historical Provider, MD  fish oil-omega-3 fatty acids 1000 MG capsule Take 2 g by mouth 2 (two) times daily.   Yes Historical Provider, MD  gabapentin (NEURONTIN) 100 MG capsule Take 100 mg by mouth 4 (four) times daily.   Yes Historical Provider, MD  ibuprofen (ADVIL,MOTRIN) 800 MG tablet Take 800 mg by mouth every 8 (eight) hours as needed for pain.   Yes Historical Provider, MD  modafinil (PROVIGIL) 200 MG tablet Take 200 mg by mouth as needed.    Yes Historical Provider, MD  oxyCODONE-acetaminophen (PERCOCET/ROXICET) 5-325 MG per tablet Take 1-2 tablets by mouth every 4 (four) hours as needed for pain. 10/27/12  Yes Wilmon Pali, PA-C  triamterene-hydrochlorothiazide (DYAZIDE) 37.5-25 MG per capsule Take 1 capsule by mouth every morning.   Yes Historical Provider, MD  oxyCODONE-acetaminophen (ROXICET) 5-325 MG per tablet Take 1 tablet by mouth every 4 (four) hours as needed for pain. 11/28/12 11/28/13  Flora Ratz Margaretann Loveless, PA-C    Physical Exam:  Filed Vitals:   11/28/12 1302  BP: 141/79  Pulse: 77  Resp: 16    GENERAL: Well-nourished, well-developed, on oxygen (2-3 liters)  CARDIOVASCULAR: Regular rate and rhythm. RESPIRATORY: Respiratory effort is normal. Lungs clear to auscultation. ABDOMEN: Bowel sounds present. No masses or tenderness. WOUNDS: Clean and dry  Imaging Studies: CXR: Decreasing post operative linear opacity on the right, no new pulmonary nodule, no pneumothorax.  Impression/Plan: Right chest tube site is clean and dry. There is no erythema or drainage. She has an appointment at [facility] this week. She is requesting a prescription for Percocet. I gave her 5/325 one po every 4 hours PRN pain (#30 with no refills).She will return to see Dr.Gerhardt in 2 months with a chest x ray.   Doree Fudge, PA-C 11/28/2012 1:23 PM

## 2012-12-01 DIAGNOSIS — Z85828 Personal history of other malignant neoplasm of skin: Secondary | ICD-10-CM | POA: Insufficient documentation

## 2012-12-01 DIAGNOSIS — F4310 Post-traumatic stress disorder, unspecified: Secondary | ICD-10-CM | POA: Insufficient documentation

## 2012-12-02 ENCOUNTER — Telehealth (HOSPITAL_COMMUNITY): Payer: Self-pay | Admitting: Oncology

## 2012-12-02 ENCOUNTER — Encounter (HOSPITAL_COMMUNITY): Payer: Medicare HMO | Attending: Oncology

## 2012-12-02 ENCOUNTER — Other Ambulatory Visit (HOSPITAL_COMMUNITY): Payer: Self-pay | Admitting: Oncology

## 2012-12-02 DIAGNOSIS — D751 Secondary polycythemia: Secondary | ICD-10-CM

## 2012-12-02 DIAGNOSIS — C7800 Secondary malignant neoplasm of unspecified lung: Secondary | ICD-10-CM

## 2012-12-02 DIAGNOSIS — D45 Polycythemia vera: Secondary | ICD-10-CM | POA: Insufficient documentation

## 2012-12-02 DIAGNOSIS — C801 Malignant (primary) neoplasm, unspecified: Secondary | ICD-10-CM

## 2012-12-02 DIAGNOSIS — E559 Vitamin D deficiency, unspecified: Secondary | ICD-10-CM | POA: Insufficient documentation

## 2012-12-02 LAB — CBC WITH DIFFERENTIAL/PLATELET
Basophils Absolute: 0 10*3/uL (ref 0.0–0.1)
HCT: 44.5 % (ref 36.0–46.0)
Lymphocytes Relative: 20 % (ref 12–46)
Neutro Abs: 7.3 10*3/uL (ref 1.7–7.7)
Neutrophils Relative %: 72 % (ref 43–77)
Platelets: 330 10*3/uL (ref 150–400)
RDW: 14.3 % (ref 11.5–15.5)
WBC: 10.2 10*3/uL (ref 4.0–10.5)

## 2012-12-02 NOTE — Telephone Encounter (Signed)
Patient seen at [facility].  She reports that the study would cost her un-reimbursable money and therefore she does not wish to pursue the offered protocol.  As a result, she would prefer treatment here if able.    I informed her that Dr. Mariel Sleet will be returning on 6/23.  She will contact the clinic on 6/24 if she has not heard back from an office staff member.    Will defer treatment options to Dr. Madlyn Frankel

## 2012-12-02 NOTE — Progress Notes (Signed)
Labs drawn today for cbc/diff,VD25

## 2012-12-03 LAB — VITAMIN D 25 HYDROXY (VIT D DEFICIENCY, FRACTURES): Vit D, 25-Hydroxy: 28 ng/mL — ABNORMAL LOW (ref 30–89)

## 2012-12-21 ENCOUNTER — Telehealth (HOSPITAL_COMMUNITY): Payer: Self-pay

## 2012-12-21 NOTE — Telephone Encounter (Signed)
Patient notified that treatment options will be discussed on her next visit.

## 2012-12-21 NOTE — Telephone Encounter (Signed)
Message copied by Evelena Leyden on Wed Dec 21, 2012  4:38 PM ------      Message from: Ellouise Newer      Created: Wed Dec 21, 2012  4:22 PM       Call patient.  Let her know that we will discuss her options when we see her next.  Dr. Mariel Sleet and I are still reviewing options.            ----- Message -----         From: Randall An, MD         Sent: 12/21/2012   9:34 AM           To: Ellouise Newer, PA-C            Let's just discuss with her when you see her soon      ----- Message -----         From: Ellouise Newer, PA-C         Sent: 12/21/2012   9:28 AM           To: Randall An, MD, Onc Nurse Ap            BRAF mutation was not detected.  Did you call her with this information?            Nurses copied on this message in case the patient calls so they are in the loop.            ----- Message -----         From: Randall An, MD         Sent: 12/20/2012   5:14 PM           To: Ellouise Newer, PA-C            When I spoke to Collichio she would just watch her       Let's make sure they did Braf mutation testing.                   ------

## 2012-12-29 ENCOUNTER — Encounter: Payer: Self-pay | Admitting: Cardiothoracic Surgery

## 2012-12-29 ENCOUNTER — Ambulatory Visit (INDEPENDENT_AMBULATORY_CARE_PROVIDER_SITE_OTHER): Payer: Medicare HMO | Admitting: Cardiothoracic Surgery

## 2012-12-29 ENCOUNTER — Ambulatory Visit
Admission: RE | Admit: 2012-12-29 | Discharge: 2012-12-29 | Disposition: A | Discharge status: Home / Self Care | Payer: Medicare HMO | Source: Ambulatory Visit | Attending: Cardiothoracic Surgery | Admitting: Cardiothoracic Surgery

## 2012-12-29 DIAGNOSIS — Z09 Encounter for follow-up examination after completed treatment for conditions other than malignant neoplasm: Secondary | ICD-10-CM

## 2012-12-29 DIAGNOSIS — R911 Solitary pulmonary nodule: Secondary | ICD-10-CM

## 2012-12-29 DIAGNOSIS — C3410 Malignant neoplasm of upper lobe, unspecified bronchus or lung: Secondary | ICD-10-CM

## 2012-12-29 MED ORDER — OXYCODONE-ACETAMINOPHEN 5-325 MG PO TABS: 1.0000 | ORAL_TABLET | ORAL | Status: DC | PRN

## 2012-12-29 NOTE — Progress Notes (Signed)
[address]       [place] [address]             [phone]                           [facility] Medical Record #[id number] Date of Birth: 07/02/1960  Dr Artemio Aly, MD  Chief Complaint:   PostOp Follow Up Visit 10/21/2012  PREOPERATIVE DIAGNOSIS: Right upper lobe lung mass.  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  PROCEDURE PERFORMED: Bronchoscopy, right video-assisted thoracoscopy,  mini thoracotomy, wedge resection, right upper lobe lung nodule.  SURGEON: Sheliah Plane, MD  PATH: Diagnosis 1. Lung, wedge biopsy/resection, Right upper lobe - SARCOMATOID MALIGNANCY, FAVOR MALIGNANT MELANOMA. SEE MICROSCOPIC DESCRIPTION AND OUTSIDE CONSULATION REPORT. 2. Lymph node, biopsy, 10 R - ANTHRACOTIC LYMPH NODE. NO TUMOR IDENTIFIED. 3. Lymph node, biopsy, 4 R - ANTHRACOTIC LYMPH NODE. NO TUMOR IDENTIFIED. Microscopic Comment 1. The tumor in the right upper lobe wedge specimen is 1.5 cm and is composed of spindle cells with marked nuclear atypia including occasional tumor giant cells and there are frequent mitotic figures present. Immunohistochemistry is performed and the tumor shows strong diffuse staining with S100 and NK1C3 and shows focal patchy positivity with p75. The tumor is negative with Melan-A, HMB45 and MART1 as well as desmin, muscle specific actin, CD117, estrogen receptor and progesterone receptor. Smooth muscle actin shows positivity in non-neoplastic stromal cells. All of the epithelial margins are negative including epithelial membrane antigen, Cytokeratin AE1 / AE3, Cytokeratin 5/6, Cytokeratin 8, Cytokeratin 20, Cytokeratin 7 and Cytokeratin 116. 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 The immunohistochemical findings strongly suggest  malignant melanoma; however the immunohistochemical findings are not definitive and other high grade sarcomatoid malignancies are also in the differential. Sarcomatoid carcinoma is unlikely given the negativity with all of the epithelial markers attempted. The margins of the wedge are not involved by tumor. Clinical correlation including clinical workup is suggested. Drs. Dierdre Searles and Trudee Kuster have reviewed this case and agree. Case discussed with Dr. Tyrone Sage and Dr. Mariel Sleet on 10/25/12 and 10/27/12. (JDP:caf 10/26/12) This case is sent to Dr. Marcene Brawn at [facility] for consultation. His letter is quoted as follow: "The lesion shows the appearance of a poorly differentiated malignant neoplasm composed of ovoid, spindled or focally epithelioid cells with amphophilic cytoplasm and plump vesicular nuclei showing variably prominent nucleoli There is focal striking nuclear pleomorphism. While immunostains for pan keratin, CAM 5.2, CD34, MDM2, CDK4 and ALK1 are negative, there is very strong and diffuse positivity for S100 protein and SOX10. Stromal non-neoplastic spindle cells are S&A positive. Taking the morphology with the immunophenotype, I believe that the appearances are undoubtably those of metastatic malignant melanoma. Ongoing clinical correlation to check for the presence of a primary lesion elsewhere or else a relevant prior history may be informative, although it is nowadays recognized that in as many as 10% of patients presenting with metastatic melanoma, the primary lesion may no longer be identified, possibly due to regression."  History of Present Illness:       History  Smoking status  . Former Smoker -- 2.00 packs/day for 5 years  . Types: Cigarettes  . Quit date: 09/16/2012  Smokeless tobacco  . Never Used  Allergies  Allergen Reactions  . Contrast Media (Iodinated Diagnostic Agents) Anaphylaxis  . Flagyl (Metronidazole)   . Penicillins   .  Tetracyclines & Related     Current Outpatient Prescriptions  Medication Sig Dispense Refill  . amitriptyline (ELAVIL) 25 MG tablet Take 25 mg by mouth at bedtime.      Marland Kitchen amLODipine (NORVASC) 5 MG tablet Take 5 mg by mouth daily.      Marland Kitchen aspirin-acetaminophen-caffeine (EXCEDRIN MIGRAINE) 250-250-65 MG per tablet Take 1 tablet by mouth every 6 (six) hours as needed for pain.      Marland Kitchen atorvastatin (LIPITOR) 40 MG tablet Take 40 mg by mouth daily.      Marland Kitchen buPROPion (WELLBUTRIN SR) 200 MG 12 hr tablet Take 200 mg by mouth daily.       . Cholecalciferol (VITAMIN D) 2000 UNITS CAPS Take 1 capsule by mouth daily.      . DULoxetine (CYMBALTA) 60 MG capsule Take 60 mg by mouth daily.      . fish oil-omega-3 fatty acids 1000 MG capsule Take 2 g by mouth 2 (two) times daily.      Marland Kitchen gabapentin (NEURONTIN) 100 MG capsule Take 100 mg by mouth 4 (four) times daily.      Marland Kitchen ibuprofen (ADVIL,MOTRIN) 800 MG tablet Take 800 mg by mouth every 8 (eight) hours as needed for pain.      . modafinil (PROVIGIL) 200 MG tablet Take 200 mg by mouth as needed.       Marland Kitchen oxyCODONE-acetaminophen (PERCOCET/ROXICET) 5-325 MG per tablet Take 1-2 tablets by mouth every 4 (four) hours as needed for pain.  30 tablet  0  . triamterene-hydrochlorothiazide (DYAZIDE) 37.5-25 MG per capsule Take 1 capsule by mouth every morning.      Marland Kitchen oxyCODONE-acetaminophen (ROXICET) 5-325 MG per tablet Take 1 tablet by mouth every 4 (four) hours as needed for pain.  30 tablet  0   No current facility-administered medications for this visit.       Physical Exam: BP 143/80  Pulse 77  Resp 18  Ht 5' 7.5" (1.715 m)  Wt 260 lb (117.935 kg)  BMI 40.1 kg/m2  SpO2 98%  General appearance: alert and cooperative Neurologic: intact Heart: regular rate and rhythm, S1, S2 normal, no murmur, click, rub or gallop and normal apical impulse Lungs: clear to auscultation bilaterally and normal percussion bilaterally Abdomen: soft, non-tender; bowel sounds  normal; no masses,  no organomegaly Extremities: extremities normal, atraumatic, no cyanosis or edema and Homans sign is negative, no sign of DVT Wound: incision is well healed. chest tube /port site os with erythemia. sutures are removed   Diagnostic Studies & Laboratory data:         Recent Radiology Findings: Ct Chest Wo Contrast  12/29/2012   *RADIOLOGY REPORT*  Clinical Data: Long time smoking history, history of wedge resection of right upper lobe lung nodule consistent with lung carcinoma  CT CHEST WITHOUT CONTRAST  Technique:  Multidetector CT imaging of the chest was performed following the standard protocol without IV contrast.  Comparison: Chest x-ray of 11/28/2012, PET CT of 09/08/2012, and CT chest of 08/25/2012  Findings: Linear scarring is noted in the right upper lobe after wedge resection of the right upper lobe lung nodule.  Only a tiny subpleural 5 mm noncalcified nodule is noted in the posterior left lower lobe which appears completely stable.  No new or enlarging pulmonary nodule is seen.  No pleural effusion is noted.  The central airway  is patent.  On soft tissue window images, the thyroid gland is unremarkable. On this unenhanced study, no mediastinal or hilar adenopathy is seen.  Only faint coronary artery calcification is seen within the distribution of the left anterior descending artery.  No abnormality of the upper abdomen is noted.  On bone window images there are degenerative changes in the thoracic spine but no acute abnormality is evident.  There is a mild thoracic scoliosis present.  IMPRESSION:  1.  Linear scarring in the right upper lobe after wedge resection of right upper lobe lung carcinoma. 2.  No new or enlarging lung nodule is seen. 3.  Stable noncalcified 5-mm nodule in the posterior left lower lobe.   Original Report Authenticated By: Dwyane Dee, M.D.      Recent Labs: Lab Results  Component Value Date   WBC 10.2 12/02/2012   HGB 15.2* 12/02/2012   HCT 44.5  12/02/2012   PLT 330 12/02/2012   GLUCOSE 135* 10/26/2012   CHOL 195 06/09/2012   TRIG 359* 06/09/2012   HDL 34* 06/09/2012   LDLCALC 89 06/09/2012   ALT 20 10/23/2012   AST 18 10/23/2012   NA 138 10/26/2012   K 3.7 10/26/2012   CL 94* 10/26/2012   CREATININE 0.73 10/26/2012   BUN 17 10/26/2012   CO2 38* 10/26/2012   TSH 3.474 06/09/2012   INR 0.90 10/19/2012   HGBA1C 6.1* 10/25/2012      Assessment / Plan:   Patient is now about 2-1/2 months post resection of melanoma right lung, and a stable 5 mm nodule left lower lobe Final path was consistent with melanoma, the patient has been evaluated at [facility] for a study and was to see Dr. Laurie Panda next week to make a decision about chemotherapy treatment. Number surgical standpoint she appears stable with her wounds well healed and no evidence of further nodule development on the early postoperative CT. I plan to see her back in 3 months with a chest x-ray.      Skii Cleland B 12/29/2012 1:48 PM

## 2013-01-02 ENCOUNTER — Encounter (HOSPITAL_COMMUNITY): Payer: Self-pay | Admitting: Oncology

## 2013-01-02 NOTE — Progress Notes (Signed)
Katelyn Antis, MD [address] [place] [address]  Metastatic melanoma  Polycythemia secondary to smoking  Polycythemia  CURRENT THERAPY: Observation  INTERVAL HISTORY: Katelyn Cline 52 y.o. female returns for  regular  visit for followup of metastatic melanoma to the right lung status post resection, BRAF mutation not detected.  Katelyn Cline was seen by Dr. Dan Cline at [facility].  The patient qualified for a clinical trial, but Katelyn Cline could not afford her medical care associated with this trial.  As a result, the patient has opted out of the trial.  Dr. Mariel Cline discussed this information with Dr. Dan Cline and she recommends close surveillance of the patient as a result, and that is what we will do.  I personally spoke with Dr. Dan Cline today regarding the patient to verify that this is the plan.  According to Dr. Dan Cline , 5 to 10% of patients are cured with surgery alone for minimal disease. As a result, it would be worthwhile performing surveillance on this patient is at a subjecting her to therapy that may be unnecessary. She recommended CTs of chest, abdomen, and pelvis every 3 months for surveillance as this carries less radiation risk than a PET scan. Dr. Dan Cline suspects that the patient will recur within the next year and at that point in time she would be a candidate for Yervoy (Ipilimumab).  I personally reviewed and went over laboratory results with the patient.  Lab work from 12/02/2012 shows a white blood cell count 10.2, a mild elevation hemoglobin 15.2, hematocrit 44.5%, and platelet count 330,000. There is no indication this point time to phlebotomize the patient since her hematocrit below 45%. We'll continue to monitor with monthly laboratory work. If her hematocrit rises above 45%, we'll start entertain the idea of phlebotomy.  The patient and plan as discussed with their patient and she is agreeable to the following plan.  I personally  reviewed and went over radiographic studies with the patient.  Stable noncalcified 5 mm nodule in the posterior left lower lobe is identified on CT of chest on 12/29/2012.  Oncologically, the patient denies any complaints and ROS questioning is negative. We'll continue to monitor the patient closely and see her in 3 months with laboratory work and repeat CT scans. She'll require CT abdomen and pelvis next week to complete the surveillance requirements for this month. This will be repeated in 3 months time.    Past Medical History  Diagnosis Date  . Narcolepsy   . HTN (hypertension)   . Fibromyalgia   . Scoliosis   . Migraines   . Major depression   . Panic attacks   . Agoraphobia   . Chronic pain   . Miscarriage     x 4  . Peripheral neuropathy   . Hyperlipidemia   . Basal cell carcinoma   . Elevated hemoglobin 2014  . Vertigo   . Polycythemia secondary to smoking   . Polycythemia vera(238.4)   . Dysrhythmia     Hx: of palpitations "a long time ago"  . Shortness of breath     Hx: of with activity  . Sleep apnea   . Metastatic melanoma 10/18/2012    12 mm posterior right upper lobe pulmonary nodule, max SUV 3.0      has DDD (degenerative disc disease), lumbar; DDD (degenerative disc disease), cervical; Chronic pain disorder; Depression; Fibromyalgia; Hyperlipidemia; Narcolepsy; Peripheral neuropathy; Tobacco use disorder; Obesity; Essential hypertension, benign; Non-healing skin lesion of nose; Polycythemia;  Polycythemia secondary to smoking; and Metastatic melanoma on her problem list.     is allergic to contrast media; flagyl; penicillins; and tetracyclines & related.  Katelyn Cline had no medications administered during this visit.  Past Surgical History  Procedure Laterality Date  . Vaginal hysterectomy    . Breast lumpectomy      bilateral  . Cystectomy      abdominal wall   . Basal cell carcinoma excision    . Colonoscopy w/ biopsies and polypectomy      Hx: of  .  Dilation and curettage of uterus      Hx: of   . Video assisted thoracoscopy (vats)/wedge resection Right 10/21/2012    Procedure: VIDEO ASSISTED THORACOSCOPY (VATS)/WEDGE RESECTION;  Surgeon: Katelyn Ovens, MD;  Location: [facility] OR;  Service: Thoracic;  Laterality: Right;  . Video bronchoscopy N/A 10/21/2012    Procedure: VIDEO BRONCHOSCOPY;  Surgeon: Katelyn Ovens, MD;  Location: [facility] OR;  Service: Thoracic;  Laterality: N/A;    Denies any headaches, dizziness, double vision, fevers, chills, night sweats, nausea, vomiting, diarrhea, constipation, chest pain, heart palpitations, shortness of breath, blood in stool, black tarry stool, urinary pain, urinary burning, urinary frequency, hematuria.   PHYSICAL EXAMINATION  ECOG PERFORMANCE STATUS: 1 - Symptomatic but completely ambulatory  Filed Vitals:   01/03/13 1306  Temp: 98.1 F (36.7 C)  Resp: 20    GENERAL:alert, no distress, well nourished, well developed, comfortable, cooperative, obese and smiling SKIN: skin color, texture, turgor are normal, no rashes or significant lesions HEAD: Normocephalic, No masses, lesions, tenderness or abnormalities EYES: normal, PERRLA, EOMI, Conjunctiva are pink and non-injected EARS: External ears normal OROPHARYNX:mucous membranes are moist  NECK: supple, no adenopathy, thyroid normal size, non-tender, without nodularity, no stridor, non-tender, trachea midline LYMPH:  no palpable lymphadenopathy BREAST:not examined LUNGS: clear to auscultation and percussion HEART: regular rate & rhythm, no murmurs, no gallops, S1 normal and S2 normal ABDOMEN:abdomen soft, non-tender, obese, normal bowel sounds, no masses or organomegaly and hepatosplenomegaly difficult to assess due to body habitus. BACK: Back symmetric, no curvature. EXTREMITIES:less then 2 second capillary refill, no joint deformities, effusion, or inflammation, no edema, no skin discoloration, no clubbing, no cyanosis  NEURO: alert &  oriented x 3 with fluent speech, no focal motor/sensory deficits, gait normal   LABORATORY DATA: CBC    Component Value Date/Time   WBC 10.2 12/02/2012 1033   RBC 4.95 12/02/2012 1033   HGB 15.2* 12/02/2012 1033   HCT 44.5 12/02/2012 1033   PLT 330 12/02/2012 1033   MCV 89.9 12/02/2012 1033   MCH 30.7 12/02/2012 1033   MCHC 34.2 12/02/2012 1033   RDW 14.3 12/02/2012 1033   LYMPHSABS 2.1 12/02/2012 1033   MONOABS 0.5 12/02/2012 1033   EOSABS 0.2 12/02/2012 1033   BASOSABS 0.0 12/02/2012 1033      Chemistry      Component Value Date/Time   NA 138 10/26/2012 0405   K 3.7 10/26/2012 0405   CL 94* 10/26/2012 0405   CO2 38* 10/26/2012 0405   BUN 17 10/26/2012 0405   CREATININE 0.73 10/26/2012 0405   CREATININE 0.72 06/09/2012 0916      Component Value Date/Time   CALCIUM 9.7 10/26/2012 0405   ALKPHOS 112 10/23/2012 0500   AST 18 10/23/2012 0500   ALT 20 10/23/2012 0500   BILITOT 0.4 10/23/2012 0500       RADIOGRAPHIC STUDIES:  12/29/2012  *RADIOLOGY REPORT*  Clinical Data: Long time smoking history, history  of wedge  resection of right upper lobe lung nodule consistent with lung  carcinoma  CT CHEST WITHOUT CONTRAST  Technique: Multidetector CT imaging of the chest was performed  following the standard protocol without IV contrast.  Comparison: Chest x-ray of 11/28/2012, PET CT of 09/08/2012, and CT  chest of 08/25/2012  Findings: Linear scarring is noted in the right upper lobe after  wedge resection of the right upper lobe lung nodule. Only a tiny  subpleural 5 mm noncalcified nodule is noted in the posterior left  lower lobe which appears completely stable. No new or enlarging  pulmonary nodule is seen. No pleural effusion is noted. The  central airway is patent.  On soft tissue window images, the thyroid gland is unremarkable.  On this unenhanced study, no mediastinal or hilar adenopathy is  seen. Only faint coronary artery calcification is seen within the  distribution of the left anterior  descending artery. No  abnormality of the upper abdomen is noted. On bone window images  there are degenerative changes in the thoracic spine but no acute  abnormality is evident. There is a mild thoracic scoliosis  present.  IMPRESSION:  1. Linear scarring in the right upper lobe after wedge resection  of right upper lobe lung carcinoma.  2. No new or enlarging lung nodule is seen.  3. Stable noncalcified 5-mm nodule in the posterior left lower  lobe.  Original Report Authenticated By: Dwyane Dee, M.D.    PATHOLOGY:  10/21/2012  ADDITIONAL INFORMATION: 1. A sample (block 1B) was sent to [facility] for BRAF testing, the results are as follows: BRAF mutation not detected. (JBK:gt, 12/08/12) Pecola Leisure MD Pathologist, Electronic Signature ( Signed 12/08/2012) FINAL DIAGNOSIS Diagnosis 1. Lung, wedge biopsy/resection, Right upper lobe - SARCOMATOID MALIGNANCY, FAVOR MALIGNANT MELANOMA. SEE MICROSCOPIC DESCRIPTION AND OUTSIDE CONSULATION REPORT. 2. Lymph node, biopsy, 10 R - ANTHRACOTIC LYMPH NODE. NO TUMOR IDENTIFIED. 3. Lymph node, biopsy, 4 R - ANTHRACOTIC LYMPH NODE. NO TUMOR IDENTIFIED. Microscopic Comment 1. The tumor in the right upper lobe wedge specimen is 1.5 cm and is composed of spindle cells with marked nuclear atypia including occasional tumor giant cells and there are frequent mitotic figures present. Immunohistochemistry is performed and the tumor shows strong diffuse staining with S100 and NK1C3 and shows focal patchy positivity with p75. The tumor is negative with Melan-A, HMB45 and MART1 as well as desmin, muscle specific actin, CD117, estrogen receptor and progesterone receptor. Smooth muscle actin shows positivity in non-neoplastic stromal cells. All of the epithelial margins are negative including epithelial membrane antigen, Cytokeratin AE1 / AE3, Cytokeratin 5/6, Cytokeratin 8, Cytokeratin 20, Cytokeratin 7 and Cytokeratin 116. The morphologic features are  consistent with a high grade sarcomatoid malignancy and the histologic circumscription suggests a metastatic lesion. The immunohistochemical findings strongly suggest malignant melanoma; however the immunohistochemical findings are not definitive and other high grade sarcomatoid malignancies are also in the differential. Sarcomatoid carcinoma is unlikely given the negativity with all of the epithelial markers attempted. The margins of the wedge are not involved by tumor. Clinical correlation including clinical workup is suggested. 1 of 3 FINAL for JAQUAYLA, HEGE ([id number]) Microscopic Comment(continued) Drs. Dierdre Searles and Trudee Kuster have reviewed this case and agree. Case discussed with Dr. Tyrone Sage and Dr. Mariel Cline on 10/25/12 and 10/27/12. (JDP:caf 10/26/12) This case is sent to Dr. Marcene Brawn at [facility] for consultation. His letter is quoted as follow: "The lesion shows the appearance of a poorly differentiated malignant neoplasm composed of ovoid, spindled  or focally epithelioid cells with amphophilic cytoplasm and plump vesicular nuclei showing variably prominent nucleoli. There is focal striking nuclear pleomorphism. While immunostains for pan keratin, CAM 5.2, CD34, MDM2, CDK4 and ALK1 are negative, there is very strong and diffuse positivity for S100 protein and SOX10. Stromal non-neoplastic spindle cells are S&A positive. Taking the morphology with the immunophenotype, I believe that the appearances are undoubtably those of metastatic malignant melanoma. Ongoing clinical correlation to check for the presence of a primary lesion elsewhere or else a relevant prior history may be informative, although it is nowadays recognized that in as many as 10% of patients presenting with metastatic melanoma, the primary lesion may no longer be identified, possibly due to regression." Consult discussed with Dr. Mariel Cline on 11/08/12. (JDP:caf 11/08/12) Jimmy Picket MD Pathologist,  Electronic Signature (Case signed 11/09/2012)     ASSESSMENT:  1. Metastatic melanoma to the right lung status post resection by Dr. Tyrone Sage on 10/21/2012, BRAF mutation not detected.  Patient Active Problem List   Diagnosis Date Noted  . Metastatic melanoma 10/18/2012  . Polycythemia secondary to smoking   . Polycythemia 08/05/2012  . Hyperlipidemia 04/09/2012  . Narcolepsy 04/09/2012  . Peripheral neuropathy 04/09/2012  . Tobacco use disorder 04/09/2012  . Obesity 04/09/2012  . Essential hypertension, benign 04/09/2012  . Non-healing skin lesion of nose 04/09/2012  . DDD (degenerative disc disease), lumbar 02/16/2012  . DDD (degenerative disc disease), cervical 02/16/2012  . Chronic pain disorder 02/16/2012  . Depression 02/16/2012  . Fibromyalgia 02/16/2012      PLAN:  1. I personally reviewed and went over laboratory results with the patient. 2. I personally reviewed and went over radiographic studies with the patient. 3. CT abd/pelvis without contrast this month. 4. CT CAP without contrast every 3 months 5. Labs in 3 months: CBC diff, CMET, LDH 6. Labs every 4 weeks: CBC 7. Return in 3 months   THERAPY PLAN:  I spoke to Dr. Dan Cline reports that surgical resection tears 5-10% of patients in Erian's situation. Therefore, we will perform surveillance with scans every 3 months her Dr. Collichio's recommendations. If the patient were to recur, which Dr. Dan Cline suspects may happen within the next 12 months, the patient would be an excellent candidate for Ipilimumab which we can perform hearing [facility]. We will see her back in 3 months following restaging scans.  All questions were answered. The patient knows to call the clinic with any problems, questions or concerns. We can certainly see the patient much sooner if necessary.  Patient and plan discussed with Dr. Gerarda Fraction and Dr. Scarlette Calico Collichio [facility]) and they are in agreement  with the aforementioned.  KEFALAS,THOMAS

## 2013-01-03 ENCOUNTER — Encounter (HOSPITAL_COMMUNITY): Payer: Medicare HMO | Attending: Oncology | Admitting: Oncology

## 2013-01-03 ENCOUNTER — Encounter (HOSPITAL_COMMUNITY): Payer: Self-pay | Admitting: Oncology

## 2013-01-03 VITALS — BP 153/85 | HR 69 | Temp 98.1°F | Resp 20 | Wt 269.3 lb

## 2013-01-03 DIAGNOSIS — C801 Malignant (primary) neoplasm, unspecified: Secondary | ICD-10-CM | POA: Insufficient documentation

## 2013-01-03 DIAGNOSIS — C799 Secondary malignant neoplasm of unspecified site: Secondary | ICD-10-CM

## 2013-01-03 DIAGNOSIS — C439 Malignant melanoma of skin, unspecified: Secondary | ICD-10-CM

## 2013-01-03 DIAGNOSIS — D751 Secondary polycythemia: Secondary | ICD-10-CM | POA: Insufficient documentation

## 2013-01-03 DIAGNOSIS — D45 Polycythemia vera: Secondary | ICD-10-CM

## 2013-01-03 LAB — COMPREHENSIVE METABOLIC PANEL
AST: 17 U/L (ref 0–37)
Alkaline Phosphatase: 143 U/L — ABNORMAL HIGH (ref 39–117)
BUN: 13 mg/dL (ref 6–23)
CO2: 29 mEq/L (ref 19–32)
Chloride: 98 mEq/L (ref 96–112)
Creatinine, Ser: 0.6 mg/dL (ref 0.50–1.10)
GFR calc non Af Amer: >90 mL/min (ref >90)
Potassium: 4 mEq/L (ref 3.5–5.1)
Total Bilirubin: 0.2 mg/dL — ABNORMAL LOW (ref 0.3–1.2)

## 2013-01-03 LAB — CBC WITH DIFFERENTIAL/PLATELET
HCT: 47.5 % — ABNORMAL HIGH (ref 36.0–46.0)
Hemoglobin: 16.4 g/dL — ABNORMAL HIGH (ref 12.0–15.0)
Lymphocytes Relative: 23 % (ref 12–46)
Monocytes Absolute: 0.5 10*3/uL (ref 0.1–1.0)
Monocytes Relative: 5 % (ref 3–12)
Neutro Abs: 6.9 10*3/uL (ref 1.7–7.7)
Neutrophils Relative %: 70 % (ref 43–77)
RBC: 5.38 MIL/uL — ABNORMAL HIGH (ref 3.87–5.11)
WBC: 10 10*3/uL (ref 4.0–10.5)

## 2013-01-03 NOTE — Patient Instructions (Addendum)
[facility] Discharge Instructions  RECOMMENDATIONS MADE BY THE CONSULTANT AND ANY TEST RESULTS WILL BE SENT TO YOUR REFERRING PHYSICIAN.  EXAM FINDINGS BY THE PHYSICIAN TODAY AND SIGNS OR SYMPTOMS TO REPORT TO CLINIC OR PRIMARY PHYSICIAN: Exam and discussion by PA.  We will monitor your blood work monthly and do scans next week then again in 3 months.  MEDICATIONS PRESCRIBED:  none    SPECIAL INSTRUCTIONS/FOLLOW-UP: Blood work today then monthly, Scans next week and again in 3 months and to be seen after scans in 3 months.  Thank you for choosing [facility] to provide your oncology and hematology care.  To afford each patient quality time with our providers, please arrive at least 15 minutes before your scheduled appointment time.  With your help, our goal is to use those 15 minutes to complete the necessary work-up to ensure our physicians have the information they need to help with your evaluation and healthcare recommendations.    Effective January 1st, 2014, we ask that you re-schedule your appointment with our physicians should you arrive 10 or more minutes late for your appointment.  We strive to give you quality time with our providers, and arriving late affects you and other patients whose appointments are after yours.    Again, thank you for choosing [facility].  Our hope is that these requests will decrease the amount of time that you wait before being seen by our physicians.       _____________________________________________________________  Should you have questions after your visit to [facility], please contact our office at [phone] between the hours of 8:30 a.m. and 5:00 p.m.  Voicemails left after 4:30 p.m. will not be returned until the following business day.  For prescription refill requests, have your pharmacy contact our office with your prescription refill request.

## 2013-01-03 NOTE — Progress Notes (Signed)
Katelyn Cline presented for labwork. Labs per MD order drawn via Peripheral Line 23 gauge needle inserted in right forearm  Good blood return present. Procedure without incident.  Needle removed intact. Patient tolerated procedure well.

## 2013-01-04 ENCOUNTER — Other Ambulatory Visit (HOSPITAL_COMMUNITY): Payer: Self-pay | Admitting: Oncology

## 2013-01-04 DIAGNOSIS — D751 Secondary polycythemia: Secondary | ICD-10-CM

## 2013-01-06 ENCOUNTER — Encounter (HOSPITAL_BASED_OUTPATIENT_CLINIC_OR_DEPARTMENT_OTHER): Payer: Medicare HMO

## 2013-01-06 DIAGNOSIS — D751 Secondary polycythemia: Secondary | ICD-10-CM

## 2013-01-06 NOTE — Progress Notes (Signed)
Katelyn Cline presents today for phlebotomy per MD orders. Phlebotomy procedure started at 1150 and ended at 1201. 500cc removed. Patient tolerated procedure well. IV needle removed intact.

## 2013-01-10 ENCOUNTER — Ambulatory Visit (HOSPITAL_COMMUNITY)
Admission: RE | Admit: 2013-01-10 | Discharge: 2013-01-10 | Disposition: A | Discharge status: Home / Self Care | Payer: Medicare HMO | Source: Ambulatory Visit | Attending: Oncology | Admitting: Oncology

## 2013-01-10 DIAGNOSIS — C801 Malignant (primary) neoplasm, unspecified: Secondary | ICD-10-CM | POA: Insufficient documentation

## 2013-01-10 DIAGNOSIS — C439 Malignant melanoma of skin, unspecified: Secondary | ICD-10-CM

## 2013-01-10 DIAGNOSIS — K429 Umbilical hernia without obstruction or gangrene: Secondary | ICD-10-CM | POA: Insufficient documentation

## 2013-01-10 DIAGNOSIS — C799 Secondary malignant neoplasm of unspecified site: Secondary | ICD-10-CM

## 2013-01-10 DIAGNOSIS — K5730 Diverticulosis of large intestine without perforation or abscess without bleeding: Secondary | ICD-10-CM | POA: Insufficient documentation

## 2013-01-10 DIAGNOSIS — K7689 Other specified diseases of liver: Secondary | ICD-10-CM | POA: Insufficient documentation

## 2013-02-03 ENCOUNTER — Other Ambulatory Visit (HOSPITAL_COMMUNITY): Payer: Medicare HMO

## 2013-02-06 ENCOUNTER — Ambulatory Visit (INDEPENDENT_AMBULATORY_CARE_PROVIDER_SITE_OTHER): Payer: Medicare HMO | Admitting: Family Medicine

## 2013-02-06 ENCOUNTER — Encounter: Payer: Self-pay | Admitting: Family Medicine

## 2013-02-06 VITALS — BP 150/100 | HR 88 | Temp 97.3°F | Resp 18 | Wt 269.0 lb

## 2013-02-06 DIAGNOSIS — J9611 Chronic respiratory failure with hypoxia: Secondary | ICD-10-CM | POA: Insufficient documentation

## 2013-02-06 DIAGNOSIS — E119 Type 2 diabetes mellitus without complications: Secondary | ICD-10-CM | POA: Insufficient documentation

## 2013-02-06 DIAGNOSIS — F329 Major depressive disorder, single episode, unspecified: Secondary | ICD-10-CM

## 2013-02-06 DIAGNOSIS — E1140 Type 2 diabetes mellitus with diabetic neuropathy, unspecified: Secondary | ICD-10-CM | POA: Insufficient documentation

## 2013-02-06 DIAGNOSIS — G894 Chronic pain syndrome: Secondary | ICD-10-CM

## 2013-02-06 DIAGNOSIS — F32A Depression, unspecified: Secondary | ICD-10-CM

## 2013-02-06 DIAGNOSIS — F3289 Other specified depressive episodes: Secondary | ICD-10-CM

## 2013-02-06 DIAGNOSIS — R7303 Prediabetes: Secondary | ICD-10-CM

## 2013-02-06 DIAGNOSIS — M5136 Other intervertebral disc degeneration, lumbar region: Secondary | ICD-10-CM

## 2013-02-06 DIAGNOSIS — C799 Secondary malignant neoplasm of unspecified site: Secondary | ICD-10-CM

## 2013-02-06 DIAGNOSIS — R7309 Other abnormal glucose: Secondary | ICD-10-CM

## 2013-02-06 DIAGNOSIS — M5137 Other intervertebral disc degeneration, lumbosacral region: Secondary | ICD-10-CM

## 2013-02-06 DIAGNOSIS — G629 Polyneuropathy, unspecified: Secondary | ICD-10-CM

## 2013-02-06 DIAGNOSIS — E785 Hyperlipidemia, unspecified: Secondary | ICD-10-CM

## 2013-02-06 DIAGNOSIS — C439 Malignant melanoma of skin, unspecified: Secondary | ICD-10-CM

## 2013-02-06 DIAGNOSIS — G609 Hereditary and idiopathic neuropathy, unspecified: Secondary | ICD-10-CM

## 2013-02-06 DIAGNOSIS — J9610 Chronic respiratory failure, unspecified whether with hypoxia or hypercapnia: Secondary | ICD-10-CM

## 2013-02-06 DIAGNOSIS — I10 Essential (primary) hypertension: Secondary | ICD-10-CM

## 2013-02-06 DIAGNOSIS — C801 Malignant (primary) neoplasm, unspecified: Secondary | ICD-10-CM

## 2013-02-06 MED ORDER — DULOXETINE HCL 60 MG PO CPEP: 120.0000 mg | ORAL_CAPSULE | Freq: Every day | ORAL | Status: DC

## 2013-02-06 MED ORDER — ATORVASTATIN CALCIUM 40 MG PO TABS: 40.0000 mg | ORAL_TABLET | Freq: Every day | ORAL | Status: DC

## 2013-02-06 MED ORDER — TRIAMTERENE-HCTZ 37.5-25 MG PO CAPS: 1.0000 | ORAL_CAPSULE | ORAL | Status: DC

## 2013-02-06 MED ORDER — AMLODIPINE BESYLATE 5 MG PO TABS: 5.0000 mg | ORAL_TABLET | Freq: Every day | ORAL | Status: DC

## 2013-02-06 MED ORDER — HYDROCODONE-ACETAMINOPHEN 5-325 MG PO TABS: 1.0000 | ORAL_TABLET | ORAL | Status: DC | PRN

## 2013-02-06 MED ORDER — GABAPENTIN 100 MG PO CAPS: 300.0000 mg | ORAL_CAPSULE | Freq: Three times a day (TID) | ORAL | Status: DC

## 2013-02-06 NOTE — Progress Notes (Signed)
  Subjective:    Patient ID: Katelyn Cline, female    DOB: May 14, 1961, 52 y.o.   MRN: [id number]  HPI  Pt here to f/u chronic medical problems No BP meds taken, often forgets to take Recent surgery for metastatic melanoma, oncology and surgery note reviewed. No current chemotherapy. On surveillance with CT scans and labs every 3 months. Needs new referral for oncology and opthalmology. Request refill on pain meds, still has pain at incision sites and her chronic back/ neck pain. On 2 L oxygen Depression- stopped her wellbutrin > 1 month ago , taking cymbalta, had difficulty getting meds. Cymbalta helps her neuropathy and pain.   Review of Systems  GEN- denies fatigue, fever, weight loss,weakness, recent illness HEENT- denies eye drainage, change in vision, nasal discharge, CVS- denies chest pain, palpitations RESP- denies SOB, cough, wheeze ABD- denies N/V, change in stools, abd pain GU- denies dysuria, hematuria, dribbling, incontinence MSK- + joint pain, +muscle aches, injury Neuro- denies headache, dizziness, syncope, seizure activity      Objective:   Physical Exam GEN- NAD, alert and oriented x3 HEENT- PERRL, EOMI, non injected sclera, pink conjunctiva, MMM, oropharynx clear Neck- Supple, no LAD CVS- RRR, no murmur RESP-CTAB Skin- well healed surgical scars right chest wall EXT- No edema Pulses- Radial, DP- 2+ Skin- xanthelsema bilateral eye lids, small papules on side of nose        Assessment & Plan:

## 2013-02-06 NOTE — Assessment & Plan Note (Signed)
Not taking lipitor on regular basis

## 2013-02-06 NOTE — Assessment & Plan Note (Signed)
2/2 lung cancer, smoking history, polycythemia

## 2013-02-06 NOTE — Patient Instructions (Addendum)
Referral to oncology  Referral to eye doctor Continue pain meds Cymbalta increased to 120 mg [facility] F/U 2 months fasting

## 2013-02-06 NOTE — Assessment & Plan Note (Addendum)
New referral to be sent to oncology to continue care Referral to opthalmology On oxygen therapy, request lighter portable tank

## 2013-02-06 NOTE — Assessment & Plan Note (Signed)
Pt off wellbutrin, will increase cymbalta

## 2013-02-06 NOTE — Assessment & Plan Note (Signed)
Uncontrolled, no meds taken , discussed taking meds on regular basis

## 2013-02-06 NOTE — Assessment & Plan Note (Signed)
Per above, pain meds

## 2013-02-06 NOTE — Assessment & Plan Note (Signed)
Increase cymbalta to 120mg 

## 2013-02-06 NOTE — Assessment & Plan Note (Signed)
Last A1C 6.1%, recheck with fasting labs next visit

## 2013-02-06 NOTE — Assessment & Plan Note (Signed)
Worsened due to recent lung surgery and chronic back pain With her recent interventions I am willing to refill her meds #60 tablets

## 2013-02-14 ENCOUNTER — Other Ambulatory Visit: Payer: Self-pay | Admitting: *Deleted

## 2013-02-14 DIAGNOSIS — R911 Solitary pulmonary nodule: Secondary | ICD-10-CM

## 2013-02-16 ENCOUNTER — Ambulatory Visit: Payer: Medicare HMO | Admitting: Cardiothoracic Surgery

## 2013-02-17 ENCOUNTER — Ambulatory Visit: Payer: Medicare HMO | Admitting: Cardiothoracic Surgery

## 2013-03-10 ENCOUNTER — Other Ambulatory Visit (HOSPITAL_COMMUNITY): Payer: Medicare HMO

## 2013-03-16 ENCOUNTER — Ambulatory Visit: Payer: Commercial Managed Care - HMO | Admitting: Cardiothoracic Surgery

## 2013-04-05 ENCOUNTER — Ambulatory Visit (HOSPITAL_COMMUNITY)
Admission: RE | Admit: 2013-04-05 | Discharge: 2013-04-05 | Disposition: A | Discharge status: Home / Self Care | Payer: Medicare HMO | Source: Ambulatory Visit | Attending: Oncology | Admitting: Oncology

## 2013-04-05 ENCOUNTER — Other Ambulatory Visit (HOSPITAL_COMMUNITY): Payer: Medicare HMO

## 2013-04-05 ENCOUNTER — Ambulatory Visit (HOSPITAL_COMMUNITY): Payer: Medicare HMO | Admitting: Oncology

## 2013-04-05 ENCOUNTER — Ambulatory Visit (HOSPITAL_COMMUNITY): Payer: Medicare HMO

## 2013-04-05 DIAGNOSIS — R918 Other nonspecific abnormal finding of lung field: Secondary | ICD-10-CM | POA: Insufficient documentation

## 2013-04-05 DIAGNOSIS — C439 Malignant melanoma of skin, unspecified: Secondary | ICD-10-CM

## 2013-04-05 DIAGNOSIS — K5730 Diverticulosis of large intestine without perforation or abscess without bleeding: Secondary | ICD-10-CM | POA: Insufficient documentation

## 2013-04-05 DIAGNOSIS — R0602 Shortness of breath: Secondary | ICD-10-CM | POA: Insufficient documentation

## 2013-04-05 DIAGNOSIS — I2510 Atherosclerotic heart disease of native coronary artery without angina pectoris: Secondary | ICD-10-CM | POA: Insufficient documentation

## 2013-04-05 DIAGNOSIS — I700 Atherosclerosis of aorta: Secondary | ICD-10-CM | POA: Insufficient documentation

## 2013-04-05 DIAGNOSIS — K7689 Other specified diseases of liver: Secondary | ICD-10-CM | POA: Insufficient documentation

## 2013-04-05 DIAGNOSIS — J984 Other disorders of lung: Secondary | ICD-10-CM | POA: Insufficient documentation

## 2013-04-05 DIAGNOSIS — C799 Secondary malignant neoplasm of unspecified site: Secondary | ICD-10-CM

## 2013-04-06 ENCOUNTER — Encounter: Payer: Self-pay | Admitting: Cardiothoracic Surgery

## 2013-04-06 ENCOUNTER — Ambulatory Visit (INDEPENDENT_AMBULATORY_CARE_PROVIDER_SITE_OTHER): Payer: Medicare HMO | Admitting: Cardiothoracic Surgery

## 2013-04-06 DIAGNOSIS — Z09 Encounter for follow-up examination after completed treatment for conditions other than malignant neoplasm: Secondary | ICD-10-CM

## 2013-04-06 DIAGNOSIS — C3410 Malignant neoplasm of upper lobe, unspecified bronchus or lung: Secondary | ICD-10-CM

## 2013-04-06 NOTE — Progress Notes (Signed)
[address]       [place] [address]             [phone]                             Cassia Fein [facility] Medical Record #[id number] Date of Birth: 1961-01-03  Dr Artemio Aly, MD  Chief Complaint:   PostOp Follow Up Visit 10/21/2012  PREOPERATIVE DIAGNOSIS: Right upper lobe lung mass.  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  PROCEDURE PERFORMED: Bronchoscopy, right video-assisted thoracoscopy,  mini thoracotomy, wedge resection, right upper lobe lung nodule.  SURGEON: Sheliah Plane, MD  PATH: Diagnosis 1. Lung, wedge biopsy/resection, Right upper lobe - SARCOMATOID MALIGNANCY, FAVOR MALIGNANT MELANOMA. SEE MICROSCOPIC DESCRIPTION AND OUTSIDE CONSULATION REPORT. 2. Lymph node, biopsy, 10 R - ANTHRACOTIC LYMPH NODE. NO TUMOR IDENTIFIED. 3. Lymph node, biopsy, 4 R - ANTHRACOTIC LYMPH NODE. NO TUMOR IDENTIFIED. Microscopic Comment 1. The tumor in the right upper lobe wedge specimen is 1.5 cm and is composed of spindle cells with marked nuclear atypia including occasional tumor giant cells and there are frequent mitotic figures present. Immunohistochemistry is performed and the tumor shows strong diffuse staining with S100 and NK1C3 and shows focal patchy positivity with p75. The tumor is negative with Melan-A, HMB45 and MART1 as well as desmin, muscle specific actin, CD117, estrogen receptor and progesterone receptor. Smooth muscle actin shows positivity in non-neoplastic stromal cells. All of the epithelial margins are negative including epithelial membrane antigen, Cytokeratin AE1 / AE3, Cytokeratin 5/6, Cytokeratin 8, Cytokeratin 20, Cytokeratin 7 and Cytokeratin 116. 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 The immunohistochemical findings strongly  suggest malignant melanoma; however the immunohistochemical findings are not definitive and other high grade sarcomatoid malignancies are also in the differential. Sarcomatoid carcinoma is unlikely given the negativity with all of the epithelial markers attempted. The margins of the wedge are not involved by tumor. Clinical correlation including clinical workup is suggested. Drs. Dierdre Searles and Trudee Kuster have reviewed this case and agree. Case discussed with Dr. Tyrone Sage and Dr. Mariel Sleet on 10/25/12 and 10/27/12. (JDP:caf 10/26/12) This case is sent to Dr. Marcene Brawn at [facility] for consultation. His letter is quoted as follow: "The lesion shows the appearance of a poorly differentiated malignant neoplasm composed of ovoid, spindled or focally epithelioid cells with amphophilic cytoplasm and plump vesicular nuclei showing variably prominent nucleoli There is focal striking nuclear pleomorphism. While immunostains for pan keratin, CAM 5.2, CD34, MDM2, CDK4 and ALK1 are negative, there is very strong and diffuse positivity for S100 protein and SOX10. Stromal non-neoplastic spindle cells are S&A positive. Taking the morphology with the immunophenotype, I believe that the appearances are undoubtably those of metastatic malignant melanoma. Ongoing clinical correlation to check for the presence of a primary lesion elsewhere or else a relevant prior history may be informative, although it is nowadays recognized that in as many as 10% of patients presenting with metastatic melanoma, the primary lesion may no longer be identified, possibly due to regression."  History of Present Illness:       History  Smoking status  . Former Smoker -- 2.00 packs/day for 5 years  . Types: Cigarettes  . Quit date: 09/16/2012  Smokeless tobacco  .  Never Used       Allergies  Allergen Reactions  . Contrast Media [Iodinated Diagnostic Agents] Anaphylaxis  . Flagyl [Metronidazole]   . Penicillins    . Tetracyclines & Related     Current Outpatient Prescriptions  Medication Sig Dispense Refill  . amLODipine (NORVASC) 5 MG tablet Take 1 tablet (5 mg total) by mouth daily.  30 tablet  3  . aspirin-acetaminophen-caffeine (EXCEDRIN MIGRAINE) 250-250-65 MG per tablet Take 1 tablet by mouth every 6 (six) hours as needed for pain.      Marland Kitchen atorvastatin (LIPITOR) 40 MG tablet Take 1 tablet (40 mg total) by mouth daily.  30 tablet  3  . Cholecalciferol (VITAMIN D) 2000 UNITS CAPS Take 1 capsule by mouth daily.      . DULoxetine (CYMBALTA) 60 MG capsule Take 2 capsules (120 mg total) by mouth daily.  60 capsule  3  . fish oil-omega-3 fatty acids 1000 MG capsule Take 2 g by mouth 2 (two) times daily.      Marland Kitchen gabapentin (NEURONTIN) 100 MG capsule Take 3 capsules (300 mg total) by mouth 3 (three) times daily.  270 capsule  3  . HYDROcodone-acetaminophen (NORCO) 5-325 MG per tablet Take 1 tablet by mouth every 4 (four) hours as needed for pain.  60 tablet  2  . ibuprofen (ADVIL,MOTRIN) 800 MG tablet Take 800 mg by mouth every 8 (eight) hours as needed for pain.      . modafinil (PROVIGIL) 200 MG tablet Take 200 mg by mouth as needed.       . triamterene-hydrochlorothiazide (DYAZIDE) 37.5-25 MG per capsule Take 1 each (1 capsule total) by mouth every morning.  30 capsule  3   No current facility-administered medications for this visit.       Physical Exam: BP 162/83  Pulse 81  Resp 16  Ht 5' 7.5" (1.715 m)  Wt 260 lb (117.935 kg)  BMI 40.1 kg/m2  SpO2 95%  General appearance: alert and cooperative Neurologic: intact Heart: regular rate and rhythm, S1, S2 normal, no murmur, click, rub or gallop and normal apical impulse Lungs: clear to auscultation bilaterally and normal percussion bilaterally Abdomen: soft, non-tender; bowel sounds normal; no masses,  no organomegaly Extremities: extremities normal, atraumatic, no cyanosis or edema and Homans sign is negative, no sign of DVT Wound:  incision is well healed. chest tube /port site os with erythemia. sutures are removed   Diagnostic Studies & Laboratory data:         Recent Radiology Findings: Ct Chest Wo Contrast  04/05/2013   CLINICAL DATA:  Followup evaluation of pulmonary nodules. History of basal cell carcinoma and metastatic melanoma. Shortness of breath.  EXAM: CT CHEST WITHOUT CONTRAST  TECHNIQUE: Multidetector CT imaging of the chest was performed following the standard protocol without IV contrast.  COMPARISON:  None.  FINDINGS: CT CHEST FINDINGS  Mediastinum: Heart size is normal. There is no significant pericardial fluid, thickening or pericardial calcification. There is atherosclerosis of the thoracic aorta, the great vessels of the mediastinum and the coronary arteries, including calcified atherosclerotic plaque in the left main, left anterior descending, left circumflex and right coronary arteries. No pathologically enlarged mediastinal or hilar lymph nodes. Please note that accurate exclusion of hilar adenopathy is limited on noncontrast CT scans. Esophagus is unremarkable in appearance.  Lungs/Pleura: 5 mm subpleural nodule in the posterior aspect of the left lower lobe (image 50 of series 3), unchanged. 3 mm nodule in the anterior aspect of the right  upper lobe (image 37 of series 3), unchanged in retrospect compared to prior study 08/25/2012. In addition, a 5 mm nodule in the anterior aspect of the left lower lobe (image 44 of series 3) is also unchanged. Postoperative changes of wedge resection are again noted in the right upper lobe. No definite evidence of local recurrence of the previously noted pulmonary nodule. Mild postoperative scarring in this region is similar to the prior study. No new suspicious appearing pulmonary nodules or masses are otherwise noted. No acute consolidative airspace disease. No pleural effusions.  Musculoskeletal: There are no aggressive appearing lytic or blastic lesions noted in the  visualized portions of the skeleton.  CT ABDOMEN AND PELVIS FINDINGS  Abdomen/Pelvis: Diffuse decreased attenuation throughout the hepatic parenchyma, compatible with hepatic steatosis. The appearance of the gallbladder, pancreas, spleen, bilateral adrenal glands and bilateral kidneys is unremarkable. No significant volume of ascites. No pneumoperitoneum. No pathologic distention of small bowel. No definite lymphadenopathy identified within the abdomen or pelvis. Numerous colonic diverticulae noted, particularly in the region of the sigmoid colon, without surrounding inflammatory changes to suggest an acute diverticulitis at this time. Normal appendix (retrocecal). Status post hysterectomy. Ovaries are not confidently identified may be surgically absent or atrophic. Urinary bladder is unremarkable in appearance. Atherosclerosis throughout the abdominal and pelvic vasculature, without definite aneurysm.  Musculoskeletal: There are no aggressive appearing lytic or blastic lesions noted in the visualized portions of the skeleton.  IMPRESSION: CT CHEST FINDINGS:  1. Status post right upper lobe wedge resection with stable postoperative scarring, and no evidence to suggest local recurrence of disease or new metastatic disease in the thorax. 2. Tiny nonspecific pulmonary nodules in the lungs bilaterally, largest of which measures only 5 mm in left lower lobe, similar to prior studies. Continued attention on future followup studies is recommended. 3. Atherosclerosis, including left main and 3 vessel coronary artery disease. Please note that although the presence of coronary artery calcium documents the presence of coronary artery disease, the severity of this disease and any potential stenosis cannot be assessed on this non-gated CT examination. Assessment for potential risk factor modification, dietary therapy or pharmacologic therapy may be warranted, if clinically indicated.  CT ABDOMEN AND PELVIS FINDINGS:  1. No  definite findings to suggest metastatic disease to the abdomen or pelvis on today's non contrast CT examination. 2. Colonic diverticulosis without findings to suggest acute diverticulitis at this time. 3. Hepatic steatosis. 4. Atherosclerosis.   Electronically Signed   By: Trudie Reed M.D.   On: 04/05/2013 15:40    Ct Chest Wo Contrast  12/29/2012   *RADIOLOGY REPORT*  Clinical Data: Long time smoking history, history of wedge resection of right upper lobe lung nodule consistent with lung carcinoma  CT CHEST WITHOUT CONTRAST  Technique:  Multidetector CT imaging of the chest was performed following the standard protocol without IV contrast.  Comparison: Chest x-ray of 11/28/2012, PET CT of 09/08/2012, and CT chest of 08/25/2012  Findings: Linear scarring is noted in the right upper lobe after wedge resection of the right upper lobe lung nodule.  Only a tiny subpleural 5 mm noncalcified nodule is noted in the posterior left lower lobe which appears completely stable.  No new or enlarging pulmonary nodule is seen.  No pleural effusion is noted.  The central airway is patent.  On soft tissue window images, the thyroid gland is unremarkable. On this unenhanced study, no mediastinal or hilar adenopathy is seen.  Only faint coronary artery calcification is seen within  the distribution of the left anterior descending artery.  No abnormality of the upper abdomen is noted.  On bone window images there are degenerative changes in the thoracic spine but no acute abnormality is evident.  There is a mild thoracic scoliosis present.  IMPRESSION:  1.  Linear scarring in the right upper lobe after wedge resection of right upper lobe lung carcinoma. 2.  No new or enlarging lung nodule is seen. 3.  Stable noncalcified 5-mm nodule in the posterior left lower lobe.   Original Report Authenticated By: Dwyane Dee, M.D.      Recent Labs: Lab Results  Component Value Date   WBC 10.0 01/03/2013   HGB 16.4* 01/03/2013   HCT  47.5* 01/03/2013   PLT 272 01/03/2013   GLUCOSE 140* 01/03/2013   CHOL 195 06/09/2012   TRIG 359* 06/09/2012   HDL 34* 06/09/2012   LDLCALC 89 06/09/2012   ALT 23 01/03/2013   AST 17 01/03/2013   NA 137 01/03/2013   K 4.0 01/03/2013   CL 98 01/03/2013   CREATININE 0.60 01/03/2013   BUN 13 01/03/2013   CO2 29 01/03/2013   TSH 3.474 06/09/2012   INR 0.90 10/19/2012   HGBA1C 6.1* 10/25/2012      Assessment / Plan:   Patient is now about 6  months post resection of melanoma right lung, and a stable 5 mm nodule left lower lobe Final path was consistent with melanoma, the patient has been evaluated at [facility] for a study, but decided not to enroll From surgical standpoint she appears stable with her wounds well healed and no evidence of further nodule development on the early postoperative CT. Plan to see her back in 6 months, she has a followup appointment with oncology Kateri Plummer.      Jujuan Dugo B 04/06/2013 1:09 PM

## 2013-04-07 ENCOUNTER — Encounter (HOSPITAL_COMMUNITY): Payer: Self-pay | Admitting: Oncology

## 2013-04-07 ENCOUNTER — Encounter (HOSPITAL_COMMUNITY): Payer: Medicare HMO | Attending: Oncology | Admitting: Oncology

## 2013-04-07 VITALS — BP 149/89 | HR 89 | Temp 97.0°F | Resp 18 | Wt 265.5 lb

## 2013-04-07 DIAGNOSIS — D751 Secondary polycythemia: Secondary | ICD-10-CM | POA: Insufficient documentation

## 2013-04-07 DIAGNOSIS — Z23 Encounter for immunization: Secondary | ICD-10-CM

## 2013-04-07 DIAGNOSIS — Z0000 Encounter for general adult medical examination without abnormal findings: Secondary | ICD-10-CM

## 2013-04-07 DIAGNOSIS — C799 Secondary malignant neoplasm of unspecified site: Secondary | ICD-10-CM

## 2013-04-07 DIAGNOSIS — C801 Malignant (primary) neoplasm, unspecified: Secondary | ICD-10-CM | POA: Insufficient documentation

## 2013-04-07 DIAGNOSIS — C439 Malignant melanoma of skin, unspecified: Secondary | ICD-10-CM

## 2013-04-07 LAB — COMPREHENSIVE METABOLIC PANEL
AST: 15 U/L (ref 0–37)
BUN: 11 mg/dL (ref 6–23)
CO2: 30 mEq/L (ref 19–32)
Calcium: 9.5 mg/dL (ref 8.4–10.5)
Chloride: 97 mEq/L (ref 96–112)
Creatinine, Ser: 0.59 mg/dL (ref 0.50–1.10)
GFR calc Af Amer: >90 mL/min (ref >90)
GFR calc non Af Amer: >90 mL/min (ref >90)
Glucose, Bld: 126 mg/dL — ABNORMAL HIGH (ref 70–99)
Total Bilirubin: 0.2 mg/dL — ABNORMAL LOW (ref 0.3–1.2)

## 2013-04-07 LAB — CBC WITH DIFFERENTIAL/PLATELET
Basophils Absolute: 0.1 10*3/uL (ref 0.0–0.1)
Eosinophils Relative: 2 % (ref 0–5)
HCT: 50.4 % — ABNORMAL HIGH (ref 36.0–46.0)
Hemoglobin: 17.1 g/dL — ABNORMAL HIGH (ref 12.0–15.0)
Lymphocytes Relative: 22 % (ref 12–46)
Lymphs Abs: 2.2 10*3/uL (ref 0.7–4.0)
MCV: 90 fL (ref 78.0–100.0)
Monocytes Absolute: 0.5 10*3/uL (ref 0.1–1.0)
Monocytes Relative: 5 % (ref 3–12)
Neutro Abs: 7.1 10*3/uL (ref 1.7–7.7)
Platelets: 265 10*3/uL (ref 150–400)
RDW: 14.8 % (ref 11.5–15.5)
WBC: 10 10*3/uL (ref 4.0–10.5)

## 2013-04-07 LAB — LACTATE DEHYDROGENASE: LDH: 150 U/L (ref 94–250)

## 2013-04-07 MED ORDER — INFLUENZA VAC SPLIT QUAD 0.5 ML IM SUSP
0.5000 mL | Freq: Once | INTRAMUSCULAR | Status: AC
  Administered 2013-04-07: 0.5 mL via INTRAMUSCULAR
  Filled 2013-04-07: qty 0.5

## 2013-04-07 NOTE — Patient Instructions (Addendum)
[facility] Discharge Instructions  RECOMMENDATIONS MADE BY THE CONSULTANT AND ANY TEST RESULTS WILL BE SENT TO YOUR REFERRING PHYSICIAN.  EXAM FINDINGS BY THE PHYSICIAN TODAY AND SIGNS OR SYMPTOMS TO REPORT TO CLINIC OR PRIMARY PHYSICIAN: Exam and findings as discussed by T. Kefalas, PA-C.  INSTRUCTIONS/FOLLOW-UP: 1.  You received your flu vaccine today. 2.  You had lab work drawn today and we will contact you with the results. 3.  Please keep your scheduled appointments for a CT (you already have the contrast, instructions are below) and office visit again in 3 months as scheduled.  Contact us sooner if you have questions or concerns.  [facility] Radiology Department  Instructions for CT Abdomen/Pelvis   **WARNING** IF YOU ARE ALLERGIC TO IODINE/X-RAY DYE, PLEASE NOTIFY us IMMEDIATELY.  YOU MAY NEED ADDITIONAL PRE-MEDICATIONS THE DAY PRIOR TO YOUR EXAM.   Please follow these instructions on the day of your exam.   Do not eat or drink anything 2 hours prior to the exam time.   You will be given two bottles of ReadiCat oral contrast solution to drink.  The solution may taste better if refrigerated, but do not add ice.  SHAKE WELL BEFORE DRINKING   Drink one bottle of ReadiCat (barium solution) 2 hours prior to your exam.   Drink one bottle of ReadiCat (barium solution) 1 hour prior to your exam.   You may take any medications as prescribed, with a small amount of water, if necessary.   Please arrive 30 minutes prior to appointment time to register.   Come in and report to Short Stay to register.   If registering in any area other than Radiology, notify the Radiology front office upon your arrival in the Radiology Department and they will notify the CT Department.  The purpose of you drinking the oral contrast solution (ReadiCat) is to aid in the visualization of your intestinal tract.  The contrast solution may cause some diarrhea.  Before your exam is started, you will be given a small amount of water to drink.  Depending on your individual set of symptoms, you may also receive an intravenous injection of x-ray contrast/iodine.  Plan on being in Radiology for 30-60 minutes or longer, depending on the type of exam you are having performed.  If you have any questions regarding your procedure, you may call the CT Department at [phone].   __________________________________________________  Thank you for choosing [facility] to provide your oncology and hematology care.  To afford each patient quality time with our providers, please arrive at least 15 minutes before your scheduled appointment time.  With your help, our goal is to use those 15 minutes to complete the necessary work-up to ensure our physicians have the information they need to help with your evaluation and healthcare recommendations.    Effective January 1st, 2014, we ask that you re-schedule your appointment with our physicians should you arrive 10 or more minutes late for your appointment.  We strive to give you quality time with our providers, and arriving late affects you and other patients whose appointments are after yours.    Again, thank you for choosing [facility].  Our hope is that these requests will decrease the amount of time that you wait before being seen by our physicians.       _____________________________________________________________  Should you have questions after your visit to [facility], please contact our office at [phone] between  the hours of 8:30 a.m. and 5:00 p.m.  Voicemails left after 4:30 p.m. will not be returned until the following business day.  For prescription refill requests, have your pharmacy contact our office with your prescription refill request.

## 2013-04-07 NOTE — Progress Notes (Signed)
Katelyn Cline's reason for visit today is for an injection and labs as scheduled per MD orders.  Labs were drawn prior to administration of ordered medication.  Venipuncture performed with a 23 gauge butterfly needle to R Antecubital.  Katelyn Cline also received her flu vaccine per MD orders; see [facility] for administration details.  Katelyn Cline tolerated all procedures well and without incident; questions were answered and patient was discharged.

## 2013-04-07 NOTE — Progress Notes (Signed)
Katelyn Antis, MD [address] [place] [address]  Metastatic melanoma  CURRENT THERAPY: Surveillance  INTERVAL HISTORY: Katelyn Cline 52 y.o. female returns for  regular  visit for followup of metastatic melanoma to the right lung status post resection, BRAF mutation not detected.    [place] was seen by Dr. Dan Europe at [facility]. The patient qualified for a clinical trial, but [place] could not afford her medical care associated with this trial. As a result, the patient has opted out of the trial. Dr. Mariel Sleet discussed this information with Dr. Dan Europe and she recommends close surveillance of the patient as a result, and that is what we will do. I personally spoke with Dr. Dan Europe today regarding the patient to verify that this is the plan.   According to Dr. Dan Europe , 5 to 10% of patients are cured with surgery alone for minimal disease. As a result, it would be worthwhile performing surveillance on this patient is at a subjecting her to therapy that may be unnecessary. She recommended CTs of chest, abdomen, and pelvis every 3 months for surveillance as this carries less radiation risk than a PET scan. Dr. Dan Europe suspects that the patient will recur within the next year and at that point in time she would be a candidate for Yervoy (Ipilimumab).   I personally reviewed and went over radiographic studies with the patient.  CT scans perforemd on 04/05/2013 of CT CAP without contrast (due to dye study allergy) and was negative for recurrence of disease.  We will perform labs today to see where she stands from a hematologic standpoint.  I personally reviewed and went over laboratory results with the patient.  Labs are old from July 2014.  She notes that she is requiring O2 more often as of late.  She also notes a cough that is minimally productive, but she has not seen the color of the sputum.  She denies any hemoptysis.  Hematologically and oncologically,  the patient denies any complaints and ROS questioning is negative.   Past Medical History  Diagnosis Date  . Narcolepsy   . HTN (hypertension)   . Fibromyalgia   . Scoliosis   . Migraines   . Major depression   . Panic attacks   . Agoraphobia   . Chronic pain   . Miscarriage     x 4  . Peripheral neuropathy   . Hyperlipidemia   . Basal cell carcinoma   . Elevated hemoglobin 2014  . Vertigo   . Polycythemia secondary to smoking   . Polycythemia vera(238.4)   . Dysrhythmia     Hx: of palpitations "a long time ago"  . Shortness of breath     Hx: of with activity  . Sleep apnea   . Metastatic melanoma 10/18/2012    12 mm posterior right upper lobe pulmonary nodule, max SUV 3.0      has DDD (degenerative disc disease), lumbar; DDD (degenerative disc disease), cervical; Chronic pain disorder; Depression; Fibromyalgia; Hyperlipidemia; Narcolepsy; Peripheral neuropathy; Tobacco use disorder; Obesity; Essential hypertension, benign; Non-healing skin lesion of nose; Polycythemia; Polycythemia secondary to smoking; Metastatic melanoma; Prediabetes; Chronic hypoxemic respiratory failure; Basal cell carcinoma of skin; and Posttraumatic stress disorder on her problem list.     is allergic to contrast media; flagyl; penicillins; and tetracyclines & related.  Ms. Memon had no medications administered during this visit.  Past Surgical History  Procedure Laterality Date  . Vaginal hysterectomy    .  Breast lumpectomy      bilateral  . Cystectomy      abdominal wall   . Basal cell carcinoma excision    . Colonoscopy w/ biopsies and polypectomy      Hx: of  . Dilation and curettage of uterus      Hx: of   . Video assisted thoracoscopy (vats)/wedge resection Right 10/21/2012    Procedure: VIDEO ASSISTED THORACOSCOPY (VATS)/WEDGE RESECTION;  Surgeon: Delight Ovens, MD;  Location: [facility] OR;  Service: Thoracic;  Laterality: Right;  . Video bronchoscopy N/A 10/21/2012    Procedure: VIDEO  BRONCHOSCOPY;  Surgeon: Delight Ovens, MD;  Location: [facility] OR;  Service: Thoracic;  Laterality: N/A;    Denies any headaches, dizziness, double vision, fevers, chills, night sweats, nausea, vomiting, diarrhea, constipation, chest pain, heart palpitations, shortness of breath, blood in stool, black tarry stool, urinary pain, urinary burning, urinary frequency, hematuria.   PHYSICAL EXAMINATION  ECOG PERFORMANCE STATUS: 1 - Symptomatic but completely ambulatory  Filed Vitals:   04/07/13 1337  BP: 149/89  Pulse: 89  Temp: 97 F (36.1 C)  Resp: 18    GENERAL:alert, no distress, well nourished, well developed, comfortable, cooperative, obese, smiling and accompanied by her service dog Eddie. SKIN: skin color, texture, turgor are normal, no rashes or significant lesions HEAD: Normocephalic, No masses, lesions, tenderness or abnormalities EYES: normal, PERRLA, EOMI, Conjunctiva are pink and non-injected EARS: External ears normal OROPHARYNX:mucous membranes are moist  NECK: supple, no adenopathy, thyroid normal size, non-tender, without nodularity, no stridor, non-tender, trachea midline LYMPH:  no palpable lymphadenopathy BREAST:not examined LUNGS: clear to auscultation and percussion, decreased breath sounds HEART: regular rate & rhythm, no murmurs, no gallops, S1 normal and S2 normal ABDOMEN:abdomen soft, non-tender and normal bowel sounds BACK: Back symmetric, no curvature. EXTREMITIES:less then 2 second capillary refill, no joint deformities, effusion, or inflammation, no edema, no skin discoloration, no clubbing, no cyanosis  NEURO: alert & oriented x 3 with fluent speech, no focal motor/sensory deficits, gait normal    LABORATORY DATA: CBC    Component Value Date/Time   WBC 10.0 01/03/2013 1351   RBC 5.38* 01/03/2013 1351   HGB 16.4* 01/03/2013 1351   HCT 47.5* 01/03/2013 1351   PLT 272 01/03/2013 1351   MCV 88.3 01/03/2013 1351   MCH 30.5 01/03/2013 1351   MCHC 34.5 01/03/2013 1351     RDW 14.6 01/03/2013 1351   LYMPHSABS 2.3 01/03/2013 1351   MONOABS 0.5 01/03/2013 1351   EOSABS 0.2 01/03/2013 1351   BASOSABS 0.0 01/03/2013 1351      Chemistry      Component Value Date/Time   NA 137 01/03/2013 1351   K 4.0 01/03/2013 1351   CL 98 01/03/2013 1351   CO2 29 01/03/2013 1351   BUN 13 01/03/2013 1351   CREATININE 0.60 01/03/2013 1351   CREATININE 0.72 06/09/2012 0916      Component Value Date/Time   CALCIUM 9.9 01/03/2013 1351   ALKPHOS 143* 01/03/2013 1351   AST 17 01/03/2013 1351   ALT 23 01/03/2013 1351   BILITOT 0.2* 01/03/2013 1351        RADIOGRAPHIC STUDIES:  04/05/2013  CLINICAL DATA: Followup evaluation of pulmonary nodules. History of  basal cell carcinoma and metastatic melanoma. Shortness of breath.  EXAM:  CT CHEST WITHOUT CONTRAST  TECHNIQUE:  Multidetector CT imaging of the chest was performed following the  standard protocol without IV contrast.  COMPARISON: None.  FINDINGS:  CT CHEST FINDINGS  Mediastinum: Heart  size is normal. There is no significant  pericardial fluid, thickening or pericardial calcification. There is  atherosclerosis of the thoracic aorta, the great vessels of the  mediastinum and the coronary arteries, including calcified  atherosclerotic plaque in the left main, left anterior descending,  left circumflex and right coronary arteries. No pathologically  enlarged mediastinal or hilar lymph nodes. Please note that accurate  exclusion of hilar adenopathy is limited on noncontrast CT scans.  Esophagus is unremarkable in appearance.  Lungs/Pleura: 5 mm subpleural nodule in the posterior aspect of the  left lower lobe (image 50 of series 3), unchanged. 3 mm nodule in  the anterior aspect of the right upper lobe (image 37 of series 3),  unchanged in retrospect compared to prior study 08/25/2012. In  addition, a 5 mm nodule in the anterior aspect of the left lower  lobe (image 44 of series 3) is also unchanged. Postoperative changes  of wedge  resection are again noted in the right upper lobe. No  definite evidence of local recurrence of the previously noted  pulmonary nodule. Mild postoperative scarring in this region is  similar to the prior study. No new suspicious appearing pulmonary  nodules or masses are otherwise noted. No acute consolidative  airspace disease. No pleural effusions.  Musculoskeletal: There are no aggressive appearing lytic or blastic  lesions noted in the visualized portions of the skeleton.  CT ABDOMEN AND PELVIS FINDINGS  Abdomen/Pelvis: Diffuse decreased attenuation throughout the hepatic  parenchyma, compatible with hepatic steatosis. The appearance of the  gallbladder, pancreas, spleen, bilateral adrenal glands and  bilateral kidneys is unremarkable. No significant volume of ascites.  No pneumoperitoneum. No pathologic distention of small bowel. No  definite lymphadenopathy identified within the abdomen or pelvis.  Numerous colonic diverticulae noted, particularly in the region of  the sigmoid colon, without surrounding inflammatory changes to  suggest an acute diverticulitis at this time. Normal appendix  (retrocecal). Status post hysterectomy. Ovaries are not confidently  identified may be surgically absent or atrophic. Urinary bladder is  unremarkable in appearance. Atherosclerosis throughout the abdominal  and pelvic vasculature, without definite aneurysm.  Musculoskeletal: There are no aggressive appearing lytic or blastic  lesions noted in the visualized portions of the skeleton.  IMPRESSION:  CT CHEST FINDINGS:  1. Status post right upper lobe wedge resection with stable  postoperative scarring, and no evidence to suggest local recurrence  of disease or new metastatic disease in the thorax.  2. Tiny nonspecific pulmonary nodules in the lungs bilaterally,  largest of which measures only 5 mm in left lower lobe, similar to  prior studies. Continued attention on future followup studies is    recommended.  3. Atherosclerosis, including left main and 3 vessel coronary artery  disease. Please note that although the presence of coronary artery  calcium documents the presence of coronary artery disease, the  severity of this disease and any potential stenosis cannot be  assessed on this non-gated CT examination. Assessment for potential  risk factor modification, dietary therapy or pharmacologic therapy  may be warranted, if clinically indicated.  CT ABDOMEN AND PELVIS FINDINGS:  1. No definite findings to suggest metastatic disease to the abdomen  or pelvis on today's non contrast CT examination.  2. Colonic diverticulosis without findings to suggest acute  diverticulitis at this time.  3. Hepatic steatosis.  4. Atherosclerosis.  Electronically Signed  By: Trudie Reed M.D.  On: 04/05/2013 15:40     ASSESSMENT:  1. Metastatic melanoma to the right  lung status post resection by Dr. Tyrone Sage on 10/21/2012, BRAF mutation not detected.  Follow-up CT scans thus far negative for recurrence. 2. Jak2 + Polycythemia Vera  Patient Active Problem List   Diagnosis Date Noted  . Prediabetes 02/06/2013  . Chronic hypoxemic respiratory failure 02/06/2013  . Basal cell carcinoma of skin 12/01/2012  . Posttraumatic stress disorder 12/01/2012  . Metastatic melanoma 10/18/2012  . Polycythemia secondary to smoking   . Polycythemia 08/05/2012  . Hyperlipidemia 04/09/2012  . Narcolepsy 04/09/2012  . Peripheral neuropathy 04/09/2012  . Tobacco use disorder 04/09/2012  . Obesity 04/09/2012  . Essential hypertension, benign 04/09/2012  . Non-healing skin lesion of nose 04/09/2012  . DDD (degenerative disc disease), lumbar 02/16/2012  . DDD (degenerative disc disease), cervical 02/16/2012  . Chronic pain disorder 02/16/2012  . Depression 02/16/2012  . Fibromyalgia 02/16/2012     PLAN:  1. I personally reviewed and went over laboratory results with the patient. 2. I personally  reviewed and went over radiographic studies with the patient. 3. Labs today: CBC diff, CMET, LDH 4. CT CAP without contrast in 3 months 5. Influenza vaccine today 6. Return in 3 months for follow-up.   THERAPY PLAN:  I spoke to Dr. Dan Europe after the patient's consult with her and she reported that surgical resection cures 5-10% of patients in [place]'s situation. Therefore, we will perform surveillance with scans every 3 months per Dr. Aubery Lapping recommendations. If the patient were to recur, which Dr. Dan Europe suspects may happen within the next 12 months, the patient would be an excellent candidate for Ipilimumab which we can perform hear at the [facility]. We will see her back in 3 months following restaging scans.   All questions were answered. The patient knows to call the clinic with any problems, questions or concerns. We can certainly see the patient much sooner if necessary.  Patient and plan discussed with Dr. Alla German and he is in agreement with the aforementioned.   KEFALAS,THOMAS

## 2013-04-14 ENCOUNTER — Ambulatory Visit: Payer: Medicare HMO | Admitting: Family Medicine

## 2013-04-19 ENCOUNTER — Encounter (HOSPITAL_BASED_OUTPATIENT_CLINIC_OR_DEPARTMENT_OTHER): Payer: Medicare HMO

## 2013-04-19 DIAGNOSIS — D751 Secondary polycythemia: Secondary | ICD-10-CM

## 2013-04-19 NOTE — Progress Notes (Signed)
Katelyn Cline presents today for phlebotomy per MD orders. Phlebotomy procedure started at 1120 and ended at 1132. 500cc removed. Patient tolerated procedure well. IV needle removed intact.

## 2013-04-26 ENCOUNTER — Telehealth: Payer: Self-pay | Admitting: Family Medicine

## 2013-04-26 ENCOUNTER — Encounter (HOSPITAL_COMMUNITY): Payer: Medicare HMO

## 2013-04-26 DIAGNOSIS — C439 Malignant melanoma of skin, unspecified: Secondary | ICD-10-CM

## 2013-04-26 DIAGNOSIS — C799 Secondary malignant neoplasm of unspecified site: Secondary | ICD-10-CM

## 2013-04-26 MED ORDER — HYDROCODONE-ACETAMINOPHEN 5-325 MG PO TABS: 1.0000 | ORAL_TABLET | ORAL | Status: DC | PRN

## 2013-04-26 NOTE — Telephone Encounter (Signed)
Percocet on med list with no sig.  Need sig and quantity.

## 2013-04-26 NOTE — Telephone Encounter (Signed)
?   Ok to refill; last office visit 02/06/2013

## 2013-04-26 NOTE — Telephone Encounter (Signed)
Okay to refill? 

## 2013-04-26 NOTE — Telephone Encounter (Signed)
She is actually on Norco, given in August, refilled, pt has to pick up

## 2013-04-26 NOTE — Telephone Encounter (Signed)
Patient needs her percocet refilled . Patient is out .

## 2013-04-27 ENCOUNTER — Encounter (HOSPITAL_BASED_OUTPATIENT_CLINIC_OR_DEPARTMENT_OTHER): Payer: Medicare HMO

## 2013-04-27 DIAGNOSIS — D751 Secondary polycythemia: Secondary | ICD-10-CM

## 2013-04-27 DIAGNOSIS — D45 Polycythemia vera: Secondary | ICD-10-CM

## 2013-04-27 NOTE — Progress Notes (Signed)
Katelyn Cline presents today for phlebotomy per MD orders. Phlebotomy procedure started at 1117 and ended at 1133. 500 cc removed. Patient tolerated procedure well. IV needle removed intact.

## 2013-05-22 ENCOUNTER — Other Ambulatory Visit (HOSPITAL_COMMUNITY): Payer: Medicare HMO

## 2013-06-19 ENCOUNTER — Telehealth (HOSPITAL_COMMUNITY): Payer: Self-pay | Admitting: *Deleted

## 2013-06-19 ENCOUNTER — Encounter (HOSPITAL_COMMUNITY): Payer: Medicare HMO | Attending: Oncology

## 2013-06-19 ENCOUNTER — Other Ambulatory Visit (HOSPITAL_COMMUNITY): Payer: Self-pay | Admitting: Oncology

## 2013-06-19 DIAGNOSIS — D751 Secondary polycythemia: Secondary | ICD-10-CM | POA: Insufficient documentation

## 2013-06-19 DIAGNOSIS — C439 Malignant melanoma of skin, unspecified: Secondary | ICD-10-CM | POA: Insufficient documentation

## 2013-06-19 DIAGNOSIS — C799 Secondary malignant neoplasm of unspecified site: Secondary | ICD-10-CM

## 2013-06-19 DIAGNOSIS — C801 Malignant (primary) neoplasm, unspecified: Secondary | ICD-10-CM | POA: Insufficient documentation

## 2013-06-19 DIAGNOSIS — E876 Hypokalemia: Secondary | ICD-10-CM

## 2013-06-19 LAB — COMPREHENSIVE METABOLIC PANEL
AST: 15 U/L (ref 0–37)
Albumin: 3.5 g/dL (ref 3.5–5.2)
Alkaline Phosphatase: 143 U/L — ABNORMAL HIGH (ref 39–117)
BUN: 10 mg/dL (ref 6–23)
CO2: 27 mEq/L (ref 19–32)
Chloride: 97 mEq/L (ref 96–112)
Creatinine, Ser: 0.56 mg/dL (ref 0.50–1.10)
GFR calc Af Amer: >90 mL/min (ref >90)
Glucose, Bld: 182 mg/dL — ABNORMAL HIGH (ref 70–99)
Potassium: 3.4 mEq/L — ABNORMAL LOW (ref 3.5–5.1)
Total Bilirubin: 0.2 mg/dL — ABNORMAL LOW (ref 0.3–1.2)

## 2013-06-19 LAB — CBC WITH DIFFERENTIAL/PLATELET
Basophils Absolute: 0.1 K/uL (ref 0.0–0.1)
Basophils Relative: 1 % (ref 0–1)
Eosinophils Absolute: 0.2 K/uL (ref 0.0–0.7)
Eosinophils Relative: 2 % (ref 0–5)
HCT: 47.5 % — ABNORMAL HIGH (ref 36.0–46.0)
Hemoglobin: 15.8 g/dL — ABNORMAL HIGH (ref 12.0–15.0)
Lymphocytes Relative: 21 % (ref 12–46)
Lymphs Abs: 2.2 K/uL (ref 0.7–4.0)
MCH: 29.9 pg (ref 26.0–34.0)
MCHC: 33.3 g/dL (ref 30.0–36.0)
MCV: 90 fL (ref 78.0–100.0)
Monocytes Absolute: 0.4 K/uL (ref 0.1–1.0)
Monocytes Relative: 4 % (ref 3–12)
Neutro Abs: 7.6 K/uL (ref 1.7–7.7)
Neutrophils Relative %: 73 % (ref 43–77)
Platelets: 278 K/uL (ref 150–400)
RBC: 5.28 MIL/uL — ABNORMAL HIGH (ref 3.87–5.11)
RDW: 15.1 % (ref 11.5–15.5)
WBC: 10.5 K/uL (ref 4.0–10.5)

## 2013-06-19 MED ORDER — POTASSIUM CHLORIDE CRYS ER 20 MEQ PO TBCR: 20.0000 meq | EXTENDED_RELEASE_TABLET | Freq: Every day | ORAL | Status: DC

## 2013-06-19 NOTE — Telephone Encounter (Signed)
Patient notified about potassium order. She uses [facility]. Rx cancelled at [facility] and called to [facility].

## 2013-06-19 NOTE — Progress Notes (Signed)
Katelyn Cline's reason for visit today are for labs as scheduled per MD orders.  Venipuncture performed with a 23 gauge butterfly needle to left wrist.  Katelyn Cline tolerated venipuncture well and without incident; questions were answered and patient was discharged.

## 2013-06-20 ENCOUNTER — Encounter (HOSPITAL_COMMUNITY): Payer: Medicare HMO

## 2013-06-27 ENCOUNTER — Encounter (HOSPITAL_BASED_OUTPATIENT_CLINIC_OR_DEPARTMENT_OTHER): Payer: Medicare HMO

## 2013-06-27 ENCOUNTER — Other Ambulatory Visit: Payer: Self-pay | Admitting: *Deleted

## 2013-06-27 DIAGNOSIS — D751 Secondary polycythemia: Secondary | ICD-10-CM

## 2013-06-27 MED ORDER — DULOXETINE HCL 60 MG PO CPEP: 120.0000 mg | ORAL_CAPSULE | Freq: Every day | ORAL | Status: DC

## 2013-06-27 NOTE — Telephone Encounter (Signed)
Meds refilled.

## 2013-06-27 NOTE — Progress Notes (Signed)
Katelyn Cline presents today for phlebotomy per MD orders. Phlebotomy procedure started at 1020 and ended at 1031. 500cc removed. Patient tolerated procedure well. IV needle removed intact.

## 2013-07-04 ENCOUNTER — Encounter (HOSPITAL_COMMUNITY): Payer: Medicare HMO | Attending: Oncology

## 2013-07-04 DIAGNOSIS — D751 Secondary polycythemia: Secondary | ICD-10-CM | POA: Insufficient documentation

## 2013-07-04 NOTE — Progress Notes (Signed)
Katelyn Cline presents today for phlebotomy per MD orders. Phlebotomy procedure started at 1022 and ended at 1028. 500 cc removed. Patient tolerated procedure well. IV needle removed intact.

## 2013-07-07 ENCOUNTER — Telehealth (HOSPITAL_COMMUNITY): Payer: Self-pay | Admitting: Hematology and Oncology

## 2013-07-07 ENCOUNTER — Ambulatory Visit (HOSPITAL_COMMUNITY): Payer: Medicare HMO

## 2013-07-07 ENCOUNTER — Other Ambulatory Visit (HOSPITAL_COMMUNITY): Payer: Commercial Managed Care - HMO | Admitting: Oncology

## 2013-07-07 ENCOUNTER — Other Ambulatory Visit (HOSPITAL_COMMUNITY): Payer: Medicare HMO

## 2013-07-07 ENCOUNTER — Emergency Department (HOSPITAL_COMMUNITY)
Admission: EM | Admit: 2013-07-07 | Discharge: 2013-07-07 | Disposition: A | Discharge status: Home / Self Care | Payer: Medicare HMO | Attending: Emergency Medicine | Admitting: Emergency Medicine

## 2013-07-07 ENCOUNTER — Ambulatory Visit (HOSPITAL_COMMUNITY)
Admission: RE | Admit: 2013-07-07 | Discharge: 2013-07-07 | Disposition: A | Discharge status: Home / Self Care | Payer: Medicare HMO | Source: Ambulatory Visit | Attending: Oncology | Admitting: Oncology

## 2013-07-07 ENCOUNTER — Encounter (HOSPITAL_COMMUNITY): Payer: Self-pay | Admitting: Emergency Medicine

## 2013-07-07 ENCOUNTER — Emergency Department (HOSPITAL_COMMUNITY): Payer: Medicare HMO

## 2013-07-07 DIAGNOSIS — W010XXA Fall on same level from slipping, tripping and stumbling without subsequent striking against object, initial encounter: Secondary | ICD-10-CM | POA: Insufficient documentation

## 2013-07-07 DIAGNOSIS — G4730 Sleep apnea, unspecified: Secondary | ICD-10-CM | POA: Insufficient documentation

## 2013-07-07 DIAGNOSIS — IMO0001 Reserved for inherently not codable concepts without codable children: Secondary | ICD-10-CM | POA: Insufficient documentation

## 2013-07-07 DIAGNOSIS — Y929 Unspecified place or not applicable: Secondary | ICD-10-CM | POA: Insufficient documentation

## 2013-07-07 DIAGNOSIS — Z880 Allergy status to penicillin: Secondary | ICD-10-CM | POA: Insufficient documentation

## 2013-07-07 DIAGNOSIS — S82402A Unspecified fracture of shaft of left fibula, initial encounter for closed fracture: Secondary | ICD-10-CM

## 2013-07-07 DIAGNOSIS — E785 Hyperlipidemia, unspecified: Secondary | ICD-10-CM | POA: Insufficient documentation

## 2013-07-07 DIAGNOSIS — C799 Secondary malignant neoplasm of unspecified site: Secondary | ICD-10-CM

## 2013-07-07 DIAGNOSIS — G47419 Narcolepsy without cataplexy: Secondary | ICD-10-CM | POA: Insufficient documentation

## 2013-07-07 DIAGNOSIS — R918 Other nonspecific abnormal finding of lung field: Secondary | ICD-10-CM | POA: Insufficient documentation

## 2013-07-07 DIAGNOSIS — S82899A Other fracture of unspecified lower leg, initial encounter for closed fracture: Secondary | ICD-10-CM | POA: Insufficient documentation

## 2013-07-07 DIAGNOSIS — IMO0002 Reserved for concepts with insufficient information to code with codable children: Secondary | ICD-10-CM | POA: Insufficient documentation

## 2013-07-07 DIAGNOSIS — C439 Malignant melanoma of skin, unspecified: Secondary | ICD-10-CM

## 2013-07-07 DIAGNOSIS — G8929 Other chronic pain: Secondary | ICD-10-CM | POA: Insufficient documentation

## 2013-07-07 DIAGNOSIS — G609 Hereditary and idiopathic neuropathy, unspecified: Secondary | ICD-10-CM | POA: Insufficient documentation

## 2013-07-07 DIAGNOSIS — R011 Cardiac murmur, unspecified: Secondary | ICD-10-CM | POA: Insufficient documentation

## 2013-07-07 DIAGNOSIS — Y939 Activity, unspecified: Secondary | ICD-10-CM | POA: Insufficient documentation

## 2013-07-07 DIAGNOSIS — Z87891 Personal history of nicotine dependence: Secondary | ICD-10-CM | POA: Insufficient documentation

## 2013-07-07 DIAGNOSIS — I7090 Unspecified atherosclerosis: Secondary | ICD-10-CM | POA: Insufficient documentation

## 2013-07-07 DIAGNOSIS — Z79899 Other long term (current) drug therapy: Secondary | ICD-10-CM | POA: Insufficient documentation

## 2013-07-07 DIAGNOSIS — K7689 Other specified diseases of liver: Secondary | ICD-10-CM | POA: Insufficient documentation

## 2013-07-07 DIAGNOSIS — Z85820 Personal history of malignant melanoma of skin: Secondary | ICD-10-CM | POA: Insufficient documentation

## 2013-07-07 DIAGNOSIS — F4000 Agoraphobia, unspecified: Secondary | ICD-10-CM | POA: Insufficient documentation

## 2013-07-07 DIAGNOSIS — G43909 Migraine, unspecified, not intractable, without status migrainosus: Secondary | ICD-10-CM | POA: Insufficient documentation

## 2013-07-07 DIAGNOSIS — K5730 Diverticulosis of large intestine without perforation or abscess without bleeding: Secondary | ICD-10-CM | POA: Insufficient documentation

## 2013-07-07 DIAGNOSIS — F329 Major depressive disorder, single episode, unspecified: Secondary | ICD-10-CM | POA: Insufficient documentation

## 2013-07-07 DIAGNOSIS — C7800 Secondary malignant neoplasm of unspecified lung: Secondary | ICD-10-CM | POA: Insufficient documentation

## 2013-07-07 DIAGNOSIS — S0990XA Unspecified injury of head, initial encounter: Secondary | ICD-10-CM | POA: Insufficient documentation

## 2013-07-07 DIAGNOSIS — Z862 Personal history of diseases of the blood and blood-forming organs and certain disorders involving the immune mechanism: Secondary | ICD-10-CM | POA: Insufficient documentation

## 2013-07-07 MED ORDER — OXYCODONE-ACETAMINOPHEN 5-325 MG PO TABS
1.0000 | ORAL_TABLET | Freq: Once | ORAL | Status: AC
  Administered 2013-07-07: 1 via ORAL
  Filled 2013-07-07: qty 1

## 2013-07-07 MED ORDER — OXYCODONE-ACETAMINOPHEN 5-325 MG PO TABS: 1.0000 | ORAL_TABLET | Freq: Four times a day (QID) | ORAL | Status: DC | PRN

## 2013-07-07 NOTE — ED Notes (Signed)
Pt has a service dog with her for agoraphobia

## 2013-07-07 NOTE — ED Provider Notes (Signed)
Medical screening examination/treatment/procedure(s) were performed by non-physician practitioner and as supervising physician I was immediately available for consultation/collaboration.  EKG Interpretation   None         Maudry Diego, MD 07/07/13 1517

## 2013-07-07 NOTE — ED Notes (Signed)
Pain , swelling lt ankle,foot, tripped on a root on Wednesday.

## 2013-07-07 NOTE — ED Provider Notes (Signed)
CSN: [id number]     Arrival date & time 07/07/13  1327 History   First MD Initiated Contact with Patient 07/07/13 1428     Chief Complaint  Patient presents with  . Ankle Pain   (Consider location/radiation/quality/duration/timing/severity/associated sxs/prior Treatment) Patient is a 53 y.o. female presenting with ankle pain. The history is provided by the patient.  Ankle Pain Location:  Ankle Injury: yes   Mechanism of injury: fall   Mechanism of injury comment:  Pt tripped over a tree root. Fall:    Fall occurred:  Standing and tripped   Impact surface:  Dirt   Entrapped after fall: no   Ankle location:  L ankle Pain details:    Quality:  Throbbing   Radiates to:  Does not radiate   Severity:  Moderate   Onset quality:  Gradual   Duration:  2 days   Timing:  Intermittent   Progression:  Worsening Chronicity:  New Dislocation: no   Foreign body present:  No foreign bodies Prior injury to area:  No Relieved by:  Nothing Worsened by:  Bearing weight Ineffective treatments:  NSAIDs Associated symptoms: back pain, decreased ROM, stiffness and swelling   Associated symptoms: no neck pain     Past Medical History  Diagnosis Date  . Narcolepsy   . HTN (hypertension)   . Fibromyalgia   . Scoliosis   . Migraines   . Major depression   . Panic attacks   . Agoraphobia   . Chronic pain   . Miscarriage     x 4  . Peripheral neuropathy   . Hyperlipidemia   . Elevated hemoglobin 2014  . Vertigo   . Polycythemia secondary to smoking   . Dysrhythmia     Hx: of palpitations "a long time ago"  . Shortness of breath     Hx: of with activity  . Sleep apnea   . Basal cell carcinoma   . Polycythemia vera(238.4)   . Metastatic melanoma 10/18/2012    12 mm posterior right upper lobe pulmonary nodule, max SUV 3.0     Past Surgical History  Procedure Laterality Date  . Vaginal hysterectomy    . Cystectomy      abdominal wall   . Basal cell carcinoma excision    .  Colonoscopy w/ biopsies and polypectomy      Hx: of  . Dilation and curettage of uterus      Hx: of   . Video assisted thoracoscopy (vats)/wedge resection Right 10/21/2012    Procedure: VIDEO ASSISTED THORACOSCOPY (VATS)/WEDGE RESECTION;  Surgeon: [name], MD;  Location: [place];  Service: Thoracic;  Laterality: Right;  . Video bronchoscopy N/A 10/21/2012    Procedure: VIDEO BRONCHOSCOPY;  Surgeon: [name], MD;  Location: [facility] OR;  Service: Thoracic;  Laterality: N/A;  . Breast lumpectomy      bilateral   Family History  Problem Relation Age of Onset  . Arthritis Mother   . Hyperlipidemia Mother   . Depression Mother   . Anxiety disorder Mother   . Dementia Mother   . Hypertension Father   . Hyperlipidemia Father   . Heart disease Father   . Stroke Father   . Dementia Father   . Heart disease Brother   . Alcohol abuse Maternal Grandfather   . Bipolar disorder Neg Hx   . Drug abuse Neg Hx   . OCD Neg Hx   . Paranoid behavior Neg Hx   . Schizophrenia Neg Hx   .  Seizures Neg Hx   . Sexual abuse Neg Hx   . Physical abuse Neg Hx   . ADD / ADHD Son    History  Substance Use Topics  . Smoking status: Former Smoker -- 2.00 packs/day for 5 years    Types: Cigarettes    Quit date: 09/16/2012  . Smokeless tobacco: Never Used  . Alcohol Use: Yes     Comment: 1 -2 drinks a month, occasional   OB History   Grav Para Term Preterm Abortions TAB SAB Ect Mult Living                 Review of Systems  Constitutional: Negative for activity change.       All ROS Neg except as noted in HPI  HENT: Negative for nosebleeds.   Eyes: Negative for photophobia and discharge.  Respiratory: Negative for cough, shortness of breath and wheezing.   Cardiovascular: Negative for chest pain and palpitations.  Gastrointestinal: Negative for abdominal pain and blood in stool.  Genitourinary: Negative for dysuria, frequency and hematuria.  Musculoskeletal: Positive for back pain,  myalgias and stiffness. Negative for arthralgias and neck pain.  Skin: Negative.   Neurological: Positive for headaches. Negative for dizziness, seizures and speech difficulty.  Psychiatric/Behavioral: Negative for hallucinations and confusion. The patient is nervous/anxious.     Allergies  Contrast media; Flagyl; Penicillins; and Tetracyclines & related  Home Medications   Current Outpatient Rx  Name  Route  Sig  Dispense  Refill  . amLODipine (NORVASC) 5 MG tablet   Oral   Take 1 tablet (5 mg total) by mouth daily.   30 tablet   3   . aspirin-acetaminophen-caffeine (EXCEDRIN MIGRAINE) 676-195-09 MG per tablet   Oral   Take 1 tablet by mouth every 6 (six) hours as needed for pain.         Marland Kitchen atorvastatin (LIPITOR) 40 MG tablet   Oral   Take 1 tablet (40 mg total) by mouth daily.   30 tablet   3   . Cholecalciferol (VITAMIN D) 2000 UNITS CAPS   Oral   Take 1 capsule by mouth daily.         . DULoxetine (CYMBALTA) 60 MG capsule   Oral   Take 2 capsules (120 mg total) by mouth daily.   60 capsule   3   . fish oil-omega-3 fatty acids 1000 MG capsule   Oral   Take 2 g by mouth 2 (two) times daily.         Marland Kitchen gabapentin (NEURONTIN) 100 MG capsule   Oral   Take 3 capsules (300 mg total) by mouth 3 (three) times daily.   270 capsule   3   . HYDROcodone-acetaminophen (NORCO) 5-325 MG per tablet   Oral   Take 1 tablet by mouth every 4 (four) hours as needed for pain.   60 tablet   0   . ibuprofen (ADVIL,MOTRIN) 800 MG tablet   Oral   Take 800 mg by mouth every 8 (eight) hours as needed for pain.         . modafinil (PROVIGIL) 200 MG tablet   Oral   Take 200 mg by mouth as needed.          . potassium chloride SA (K-DUR,KLOR-CON) 20 MEQ tablet   Oral   Take 1 tablet (20 mEq total) by mouth daily.   30 tablet   2   . triamterene-hydrochlorothiazide (DYAZIDE) 37.5-25 MG per capsule  Oral   Take 1 each (1 capsule total) by mouth every morning.   30  capsule   3    BP 134/69  Pulse 79  Temp(Src) 97.7 F (36.5 C) (Oral)  Resp 18  Ht 5\' 8"  (1.727 m)  Wt 270 lb (122.471 kg)  BMI 41.06 kg/m2  SpO2 95% Physical Exam  Nursing note and vitals reviewed. Constitutional: She is oriented to person, place, and time. She appears well-developed and well-nourished.  Non-toxic appearance.  HENT:  Head: Normocephalic.  Right Ear: Tympanic membrane and external ear normal.  Left Ear: Tympanic membrane and external ear normal.  Eyes: EOM and lids are normal. Pupils are equal, round, and reactive to light.  Neck: Normal range of motion. Neck supple. Carotid bruit is not present.  Cardiovascular: Normal rate, regular rhythm, intact distal pulses and normal pulses.   Murmur heard. Pulmonary/Chest: Breath sounds normal. No respiratory distress.  Abdominal: Soft. Bowel sounds are normal. There is no tenderness. There is no guarding.  Musculoskeletal: Normal range of motion.  There is full range of motion of the left hip and knee. There is some crepitus with range of motion of the left knee. There is no deformity of the left tibia. There is swelling at the lateral malleolus extending into the mid calf area. There is tenderness from the mid calf area on the left to the lateral malleolus. The Achilles tendon is intact. The dorsalis pedis pulses 2+.  Lymphadenopathy:       Head (right side): No submandibular adenopathy present.       Head (left side): No submandibular adenopathy present.    She has no cervical adenopathy.  Neurological: She is alert and oriented to person, place, and time. She has normal strength. She displays normal reflexes. No cranial nerve deficit or sensory deficit. She exhibits normal muscle tone. Coordination normal.  Skin: Skin is warm and dry.  Psychiatric: She has a normal mood and affect. Her speech is normal.    ED Course  Procedures (including critical care time) Labs Review  FRACTURE CARE LEFT FIBULA: Patient  identified by arm band. X-ray of the ankle discussed with the patient. Patient made aware that she has a fracture of the left fibula. Patient was examined, and there no neurovascular deficits appreciated. Patient fitted for Cam Walker. Cam walker applied. Crutches offered, patient states she has a cane at home that she would rather use. Patient ambulating with a cam walker with minimal problem. Patient tolerated procedure without problem. Labs Reviewed - No data to display Imaging Review Ct Abdomen Pelvis Wo Contrast  07/07/2013   CLINICAL DATA:  History of melanoma. Surveillance for metastatic disease.  EXAM: CT CHEST, ABDOMEN AND PELVIS WITHOUT CONTRAST  TECHNIQUE: Multidetector CT imaging of the chest, abdomen and pelvis was performed following the standard protocol without IV contrast.  COMPARISON:  CT of the chest, abdomen and pelvis 04/05/2013. CT of the abdomen and pelvis 01/10/2013.  FINDINGS: CT CHEST FINDINGS  Mediastinum: Heart size is normal. There is no significant pericardial fluid, thickening or pericardial calcification. No pathologically enlarged mediastinal or hilar lymph nodes. Please note that accurate exclusion of hilar adenopathy is limited on noncontrast CT scans. Esophagus is unremarkable in appearance. There is atherosclerosis of the thoracic aorta, the great vessels of the mediastinum and the coronary arteries, including calcified atherosclerotic plaque in the left main, left anterior descending, left circumflex and right coronary arteries.  Lungs/Pleura: 5 mm subpleural nodule in the posterior aspect of the left lower  lobe (image 42 of series 3) is unchanged. 3 mm right upper lobe nodule noted on the prior study is not clearly identified on today's examination, likely obscured by motion. 5 mm nodule in the anterior aspect of the left lower lobe (image 36 of series 3) is unchanged. No other suspicious appearing pulmonary nodules or masses are identified. Postoperative changes of wedge  resection in the right upper lobe are again noted. No acute consolidative airspace disease. No pleural effusions.  Musculoskeletal: There are no aggressive appearing lytic or blastic lesions noted in the visualized portions of the skeleton.  CT ABDOMEN AND PELVIS FINDINGS  Abdomen/Pelvis: Mild diffuse decreased attenuation throughout the hepatic parenchyma, compatible with hepatic steatosis. The unenhanced appearance of the gallbladder, pancreas, spleen, bilateral adrenal glands and bilateral kidneys is unremarkable. No significant volume of ascites. No pneumoperitoneum. No pathologic distention of small bowel. No definite lymphadenopathy identified within the abdomen or pelvis on today's non contrast CT examination. Numerous colonic diverticulae are noted, most pronounced in the region of the sigmoid colon, without surrounding inflammatory changes to suggest an acute diverticulitis at this time. Status post hysterectomy. Ovaries are unremarkable in appearance. Urinary bladder is normal in appearance.  Musculoskeletal: There are no aggressive appearing lytic or blastic lesions noted in the visualized portions of the skeleton.  IMPRESSION: 1. No definite findings to suggest metastatic disease to the chest, abdomen or pelvis. 2. Multiple previously demonstrated small pulmonary nodules appear similar in size, number and pattern of distribution to the prior examination, and are favored to be benign, although continued attention on future followup studies is recommended. 3. Status post right upper lobe wedge resection without evidence to suggest recurrence of previously noted right upper lobe nodule. 4. Atherosclerosis, including left main and 3 vessel coronary artery disease. Please note that although the presence of coronary artery calcium documents the presence of coronary artery disease, the severity of this disease and any potential stenosis cannot be assessed on this non-gated CT examination. Assessment for  potential risk factor modification, dietary therapy or pharmacologic therapy may be warranted, if clinically indicated. 5. Hepatic steatosis. 6. Colonic diverticulosis without findings to suggest acute diverticulitis at this time.   Electronically Signed   By: Vinnie Langton M.D.   On: 07/07/2013 14:29   Dg Ankle Complete Left  07/07/2013   CLINICAL DATA:  Status post fall with ankle pain  EXAM: LEFT ANKLE COMPLETE - 3+ VIEW  COMPARISON:  None.  FINDINGS: There is mild displaced fracture of the distal fibula with adjacent soft tissue swelling. No other acute fracture dislocation is noted.  IMPRESSION: Fracture of distal fibula.   Electronically Signed   By: Abelardo Diesel M.D.   On: 07/07/2013 14:01   Ct Chest Wo Contrast  07/07/2013   CLINICAL DATA:  History of melanoma. Surveillance for metastatic disease.  EXAM: CT CHEST, ABDOMEN AND PELVIS WITHOUT CONTRAST  TECHNIQUE: Multidetector CT imaging of the chest, abdomen and pelvis was performed following the standard protocol without IV contrast.  COMPARISON:  CT of the chest, abdomen and pelvis 04/05/2013. CT of the abdomen and pelvis 01/10/2013.  FINDINGS: CT CHEST FINDINGS  Mediastinum: Heart size is normal. There is no significant pericardial fluid, thickening or pericardial calcification. No pathologically enlarged mediastinal or hilar lymph nodes. Please note that accurate exclusion of hilar adenopathy is limited on noncontrast CT scans. Esophagus is unremarkable in appearance. There is atherosclerosis of the thoracic aorta, the great vessels of the mediastinum and the coronary arteries, including calcified atherosclerotic plaque  in the left main, left anterior descending, left circumflex and right coronary arteries.  Lungs/Pleura: 5 mm subpleural nodule in the posterior aspect of the left lower lobe (image 42 of series 3) is unchanged. 3 mm right upper lobe nodule noted on the prior study is not clearly identified on today's examination, likely obscured by  motion. 5 mm nodule in the anterior aspect of the left lower lobe (image 36 of series 3) is unchanged. No other suspicious appearing pulmonary nodules or masses are identified. Postoperative changes of wedge resection in the right upper lobe are again noted. No acute consolidative airspace disease. No pleural effusions.  Musculoskeletal: There are no aggressive appearing lytic or blastic lesions noted in the visualized portions of the skeleton.  CT ABDOMEN AND PELVIS FINDINGS  Abdomen/Pelvis: Mild diffuse decreased attenuation throughout the hepatic parenchyma, compatible with hepatic steatosis. The unenhanced appearance of the gallbladder, pancreas, spleen, bilateral adrenal glands and bilateral kidneys is unremarkable. No significant volume of ascites. No pneumoperitoneum. No pathologic distention of small bowel. No definite lymphadenopathy identified within the abdomen or pelvis on today's non contrast CT examination. Numerous colonic diverticulae are noted, most pronounced in the region of the sigmoid colon, without surrounding inflammatory changes to suggest an acute diverticulitis at this time. Status post hysterectomy. Ovaries are unremarkable in appearance. Urinary bladder is normal in appearance.  Musculoskeletal: There are no aggressive appearing lytic or blastic lesions noted in the visualized portions of the skeleton.  IMPRESSION: 1. No definite findings to suggest metastatic disease to the chest, abdomen or pelvis. 2. Multiple previously demonstrated small pulmonary nodules appear similar in size, number and pattern of distribution to the prior examination, and are favored to be benign, although continued attention on future followup studies is recommended. 3. Status post right upper lobe wedge resection without evidence to suggest recurrence of previously noted right upper lobe nodule. 4. Atherosclerosis, including left main and 3 vessel coronary artery disease. Please note that although the presence  of coronary artery calcium documents the presence of coronary artery disease, the severity of this disease and any potential stenosis cannot be assessed on this non-gated CT examination. Assessment for potential risk factor modification, dietary therapy or pharmacologic therapy may be warranted, if clinically indicated. 5. Hepatic steatosis. 6. Colonic diverticulosis without findings to suggest acute diverticulitis at this time.   Electronically Signed   By: Vinnie Langton M.D.   On: 07/07/2013 14:29   Dg Foot Complete Left  07/07/2013   CLINICAL DATA:  Status post fall with foot and ankle pain  EXAM: LEFT FOOT - COMPLETE 3+ VIEW  COMPARISON:  None.  FINDINGS: There is no evidence of fracture or dislocation. There is plantar calcaneal spur. Soft tissues are unremarkable.  IMPRESSION: No acute fracture or dislocation.   Electronically Signed   By: Abelardo Diesel M.D.   On: 07/07/2013 14:00    EKG Interpretation   None       MDM  No diagnosis found. **I have reviewed nursing notes, vital signs, and all appropriate lab and imaging results for this patient.*  X-ray of the left foot and ankle reveals a mildly displaced fracture of the distal fibula. Vital signs are well within normal limits. Pulse oximetry is 95% on room air. Within normal limits by my interpretation. Patient fitted with a cam walker. Prescription for Percocet one every 6 hours been given to the patient. Patient advised to see Dr. Luna Glasgow next week for additional evaluation and management.   Lenox Ahr, PA-C  07/07/13 1500 

## 2013-07-07 NOTE — Telephone Encounter (Signed)
PC TO [facility] [phone] SPK TO  [facility] AND REQUESTED A REFERRAL FOR PT

## 2013-07-07 NOTE — Discharge Instructions (Signed)
You have a fracture of the left fibula. Please keep your left leg elevated above your waist. Please apply ice to your fibula area tonight. Please see Dr. Luna Glasgow, or the orthopedist of your choice for followup and management of your fracture. Use Tylenol or ibuprofen for mild pain, use Percocet for more severe pain. This medication may cause drowsiness, and or constipation. Please use with caution. Fibular Fracture, Ankle, Adult, Treated with or without Immobilization You have a fracture (break) of your fibula. This is the bone in your lower leg located on the outside of the leg. These fractures are easily diagnosed with x-rays. TREATMENT  You have a simple fracture of the part of the fibula which is located between the knee and ankle. This bone usually will heal without problems and can often be treated without casting or splinting. This means the fracture will heal well during normal use and daily activities without being held in place. Sometimes a cast or splint is placed on these fractures if it is needed for comfort or if the bones are badly out of place. HOME CARE INSTRUCTIONS   Apply ice to the injury for 15-20 minutes, 03-04 times per day while awake, for 2 days. Put the ice in a plastic bag and place a thin towel between the bag of ice and your leg. This helps keep swelling down.  Use crutches as directed. Resume walking without crutches as directed by your caregiver or when comfortable doing so.  Only take over-the-counter or prescription medicines for pain, discomfort, or fever as directed by your caregiver.  Keep appointments for follow up x-rays if these are required.  If you have a removable splint or boot, do not remove the boot unless directed by your caregiver.  Warning: Do not drive a car or operate a motor vehicle until your caregiver specifically tells you it is safe to do so. SEEK MEDICAL CARE IF:   You have continued severe pain or more swelling  The medications do not  control the pain.  Your skin or nails below the injury turn blue or grey or feel cold or numb.  You develop severe pain in the leg or foot. MAKE SURE YOU:   Understand these instructions.  Will watch your condition.  Will get help right away if you are not doing well or get worse. Document Released: 06/15/2005 Document Revised: 09/07/2011 Document Reviewed: 01/20/2008 [facility] Patient Information 2014 [place].

## 2013-07-11 ENCOUNTER — Ambulatory Visit (HOSPITAL_COMMUNITY): Payer: Medicare HMO

## 2013-07-25 ENCOUNTER — Encounter (HOSPITAL_COMMUNITY): Payer: Medicare HMO

## 2013-08-01 ENCOUNTER — Other Ambulatory Visit (HOSPITAL_COMMUNITY): Payer: Medicare HMO

## 2013-08-03 ENCOUNTER — Ambulatory Visit (HOSPITAL_COMMUNITY): Payer: Medicare HMO

## 2013-08-31 ENCOUNTER — Other Ambulatory Visit (HOSPITAL_COMMUNITY): Payer: Medicare HMO

## 2013-09-07 ENCOUNTER — Encounter (HOSPITAL_COMMUNITY): Payer: Medicare HMO | Attending: Oncology

## 2013-09-07 ENCOUNTER — Encounter (HOSPITAL_COMMUNITY): Payer: Self-pay

## 2013-09-07 ENCOUNTER — Encounter (HOSPITAL_COMMUNITY): Payer: Medicare HMO

## 2013-09-07 VITALS — BP 142/91 | HR 86 | Temp 97.8°F | Resp 20 | Wt 267.0 lb

## 2013-09-07 DIAGNOSIS — C439 Malignant melanoma of skin, unspecified: Secondary | ICD-10-CM | POA: Diagnosis present

## 2013-09-07 DIAGNOSIS — D751 Secondary polycythemia: Secondary | ICD-10-CM

## 2013-09-07 DIAGNOSIS — D45 Polycythemia vera: Secondary | ICD-10-CM | POA: Insufficient documentation

## 2013-09-07 DIAGNOSIS — C7800 Secondary malignant neoplasm of unspecified lung: Secondary | ICD-10-CM

## 2013-09-07 DIAGNOSIS — C799 Secondary malignant neoplasm of unspecified site: Secondary | ICD-10-CM

## 2013-09-07 LAB — COMPREHENSIVE METABOLIC PANEL
ALBUMIN: 3.7 g/dL (ref 3.5–5.2)
ALK PHOS: 167 U/L — AB (ref 39–117)
ALT: 13 U/L (ref 0–35)
AST: 15 U/L (ref 0–37)
BILIRUBIN TOTAL: 0.3 mg/dL (ref 0.3–1.2)
BUN: 12 mg/dL (ref 6–23)
CHLORIDE: 98 meq/L (ref 96–112)
CO2: 30 mEq/L (ref 19–32)
Calcium: 9.8 mg/dL (ref 8.4–10.5)
Creatinine, Ser: 0.61 mg/dL (ref 0.50–1.10)
GFR calc Af Amer: >90 mL/min (ref >90)
GFR calc non Af Amer: >90 mL/min (ref >90)
Glucose, Bld: 163 mg/dL — ABNORMAL HIGH (ref 70–99)
Potassium: 4 mEq/L (ref 3.7–5.3)
Sodium: 141 mEq/L (ref 137–147)
Total Protein: 8.1 g/dL (ref 6.0–8.3)

## 2013-09-07 LAB — CBC WITH DIFFERENTIAL/PLATELET
BASOS ABS: 0 10*3/uL (ref 0.0–0.1)
Basophils Relative: 0 % (ref 0–1)
EOS PCT: 1 % (ref 0–5)
Eosinophils Absolute: 0.2 10*3/uL (ref 0.0–0.7)
HCT: 48.7 % — ABNORMAL HIGH (ref 36.0–46.0)
Hemoglobin: 15.6 g/dL — ABNORMAL HIGH (ref 12.0–15.0)
Lymphocytes Relative: 15 % (ref 12–46)
Lymphs Abs: 1.8 10*3/uL (ref 0.7–4.0)
MCH: 28 pg (ref 26.0–34.0)
MCHC: 32 g/dL (ref 30.0–36.0)
MCV: 87.4 fL (ref 78.0–100.0)
Monocytes Absolute: 0.5 10*3/uL (ref 0.1–1.0)
Monocytes Relative: 5 % (ref 3–12)
Neutro Abs: 9.6 10*3/uL — ABNORMAL HIGH (ref 1.7–7.7)
Neutrophils Relative %: 79 % — ABNORMAL HIGH (ref 43–77)
PLATELETS: 319 10*3/uL (ref 150–400)
RBC: 5.57 MIL/uL — ABNORMAL HIGH (ref 3.87–5.11)
RDW: 16.7 % — AB (ref 11.5–15.5)
WBC: 12.1 10*3/uL — ABNORMAL HIGH (ref 4.0–10.5)

## 2013-09-07 LAB — LACTATE DEHYDROGENASE: LDH: 217 U/L (ref 94–250)

## 2013-09-07 NOTE — Progress Notes (Signed)
[facility]  OFFICE PROGRESS NOTE  [name], MD [address] [place] [address]  DIAGNOSIS: Metastatic melanoma - Plan: CBC with Differential, Comprehensive metabolic panel, Comprehensive metabolic panel, CBC with Differential, CT Chest Wo Contrast  Polycythemia vera, JAK-2 positive - Plan: CBC with Differential, Lactate dehydrogenase, Lactate dehydrogenase, CBC with Differential  Chief Complaint  Patient presents with  . Metastatic melanoma to lung  . PVanita Cline  . Follow-up    CURRENT THERAPY: Watchful expectation and surveillance.   INTERVAL HISTORY: Katelyn Cline 53 y.o. female returns for followup of metastatic melanoma to lung, status post resection in addition the polycythemia vera, undergoing periodic phlebotomy.  GFR short of breath on exertion and has had pain in the left ankle after fracturing it on 07/11/2013. She is wearing a boot. She denies any nausea, vomiting, diarrhea, constipation, melena, hematochezia, hematuria, new skin lesions, expectoration, epistaxis, hemoptysis, vaginal bleeding, hematuria, with occasional urinary frequency. She denies any skin rash, headache, or seizures. She also denies any focal weakness.  MEDICAL HISTORY: Past Medical History  Diagnosis Date  . Narcolepsy   . HTN (hypertension)   . Fibromyalgia   . Scoliosis   . Migraines   . Major depression   . Panic attacks   . Agoraphobia   . Chronic pain   . Miscarriage     x 4  . Peripheral neuropathy   . Hyperlipidemia   . Elevated hemoglobin 2014  . Vertigo   . Polycythemia secondary to smoking   . Dysrhythmia     Hx: of palpitations "a long time ago"  . Shortness of breath     Hx: of with activity  . Sleep apnea   . Basal cell carcinoma   . Polycythemia vera(238.4)   . Metastatic melanoma 10/18/2012    12 mm posterior right upper lobe pulmonary nodule, max SUV 3.0      INTERIM HISTORY: has DDD (degenerative disc  disease), lumbar; DDD (degenerative disc disease), cervical; Chronic pain disorder; Depression; Fibromyalgia; Hyperlipidemia; Narcolepsy; Peripheral neuropathy; Tobacco use disorder; Obesity; Essential hypertension, benign; Non-healing skin lesion of nose; Polycythemia; Polycythemia secondary to smoking; Metastatic melanoma; Prediabetes; Chronic hypoxemic respiratory failure; Basal cell carcinoma of skin; and Posttraumatic stress disorder on her problem list.   Dr. Harriet Masson at [facility]. The patient qualified for a clinical trial, but [facility] could not afford her medical care associated with this trial. As a result, the patient has opted out of the trial. Dr. Tressie Stalker discussed this information with Dr. Harriet Masson and she recommends close surveillance of the patient as a result, and that is what we will do. I personally spoke with Dr. Harriet Masson today regarding the patient to verify that this is the plan.  According to Dr. Harriet Masson , 5 to 48% of patients are cured with surgery alone for minimal disease. As a result, it would be worthwhile performing surveillance on this patient is at a subjecting her to therapy that may be unnecessary. She recommended CTs of chest, abdomen, and pelvis every 3 months for surveillance as this carries less radiation risk than a PET scan. Dr. Harriet Masson suspects that the patient will recur within the next year and at that point in time she would be a candidate for [place] (Ipilimumab). CT scan done on 04/05/2013 without contrast showed no evidence of recurrence.  ALLERGIES:  is allergic to contrast media; flagyl; penicillins; and tetracyclines & related.  MEDICATIONS: has a  current medication list which includes the following prescription(s): amlodipine, aspirin-acetaminophen-caffeine, atorvastatin, vitamin d, duloxetine, fish oil-omega-3 fatty acids, gabapentin, ibuprofen, modafinil, oxycodone-acetaminophen, potassium chloride sa, triamterene-hydrochlorothiazide, and  hydrocodone-acetaminophen.  SURGICAL HISTORY:  Past Surgical History  Procedure Laterality Date  . Vaginal hysterectomy    . Cystectomy      abdominal wall   . Basal cell carcinoma excision    . Colonoscopy w/ biopsies and polypectomy      Hx: of  . Dilation and curettage of uterus      Hx: of   . Video assisted thoracoscopy (vats)/wedge resection Right 10/21/2012    Procedure: VIDEO ASSISTED THORACOSCOPY (VATS)/WEDGE RESECTION;  Surgeon: [name], MD;  Location: [facility];  Service: Thoracic;  Laterality: Right;  . Video bronchoscopy N/A 10/21/2012    Procedure: VIDEO BRONCHOSCOPY;  Surgeon: [name], MD;  Location: [facility] OR;  Service: Thoracic;  Laterality: N/A;  . Breast lumpectomy      bilateral    FAMILY HISTORY: family history includes ADD / ADHD in her son; Alcohol abuse in her maternal grandfather; Anxiety disorder in her mother; Arthritis in her mother; Dementia in her father and mother; Depression in her mother; Heart disease in her brother and father; Hyperlipidemia in her father and mother; Hypertension in her father; Stroke in her father. There is no history of Bipolar disorder, Drug abuse, OCD, Paranoid behavior, Schizophrenia, Seizures, Sexual abuse, or Physical abuse.  SOCIAL HISTORY:  reports that she quit smoking about a year ago. Her smoking use included Cigarettes. She has a 10 pack-year smoking history. She has never used smokeless tobacco. She reports that she drinks alcohol. She reports that she does not use illicit drugs.  REVIEW OF SYSTEMS:  Other than that discussed above is noncontributory.  PHYSICAL EXAMINATION: ECOG PERFORMANCE STATUS: 1 - Symptomatic but completely ambulatory  Blood pressure 142/91, pulse 86, temperature 97.8 F (36.6 C), temperature source Oral, resp. rate 20, weight 267 lb (121.11 kg).  GENERAL:alert, no distress and comfortable. Morbidly obese. SKIN: skin color, texture, turgor are normal, no rashes or significant  lesions EYES: PERLA; Conjunctiva are pink and non-injected, sclera clear NOSE. Status post surgery with skin graft, well-healed. OROPHARYNX:no exudate, no erythema on lips, buccal mucosa, or tongue. NECK: supple, thyroid normal size, non-tender, without nodularity. No masses CHEST: Increased AP diameter with no breast masses. LYMPH:  no palpable lymphadenopathy in the cervical, axillary or inguinal LUNGS: clear to auscultation and percussion with normal breathing effort HEART: regular rate & rhythm and no murmurs. ABDOMEN:abdomen soft, non-tender and normal bowel sounds MUSCULOSKELETAL:no cyanosis of digits and no clubbing. Range of motion normal.  NEURO: alert & oriented x 3 with fluent speech, no focal motor/sensory deficits   LABORATORY DATA: Office Visit on 09/07/2013  Component Date Value Ref Range Status  . LDH 09/07/2013 217  94 - 250 U/L Final   SLIGHT HEMOLYSIS  . Sodium 09/07/2013 141  137 - 147 mEq/L Final  . Potassium 09/07/2013 4.0  3.7 - 5.3 mEq/L Final  . Chloride 09/07/2013 98  96 - 112 mEq/L Final  . CO2 09/07/2013 30  19 - 32 mEq/L Final  . Glucose, Bld 09/07/2013 163* 70 - 99 mg/dL Final  . BUN 09/07/2013 12  6 - 23 mg/dL Final  . Creatinine, Ser 09/07/2013 0.61  0.50 - 1.10 mg/dL Final  . Calcium 09/07/2013 9.8  8.4 - 10.5 mg/dL Final  . Total Protein 09/07/2013 8.1  6.0 - 8.3 g/dL Final  . Albumin 09/07/2013 3.7  3.5 - 5.2 g/dL Final  . AST 09/07/2013 15  0 - 37 U/L Final  . ALT 09/07/2013 13  0 - 35 U/L Final  . Alkaline Phosphatase 09/07/2013 167* 39 - 117 U/L Final  . Total Bilirubin 09/07/2013 0.3  0.3 - 1.2 mg/dL Final  . GFR calc non Af Amer 09/07/2013 >90  >90 mL/min Final  . GFR calc Af Amer 09/07/2013 >90  >90 mL/min Final   Comment: (NOTE)                          The eGFR has been calculated using the CKD EPI equation.                          This calculation has not been validated in all clinical situations.                          eGFR's  persistently <90 mL/min signify possible Chronic Kidney                          Disease.  . WBC 09/07/2013 12.1* 4.0 - 10.5 K/uL Final  . RBC 09/07/2013 5.57* 3.87 - 5.11 MIL/uL Final  . Hemoglobin 09/07/2013 15.6* 12.0 - 15.0 g/dL Final  . HCT 09/07/2013 48.7* 36.0 - 46.0 % Final  . MCV 09/07/2013 87.4  78.0 - 100.0 fL Final  . MCH 09/07/2013 28.0  26.0 - 34.0 pg Final  . MCHC 09/07/2013 32.0  30.0 - 36.0 g/dL Final  . RDW 09/07/2013 16.7* 11.5 - 15.5 % Final  . Platelets 09/07/2013 319  150 - 400 K/uL Final  . Neutrophils Relative % 09/07/2013 79* 43 - 77 % Final  . Neutro Abs 09/07/2013 9.6* 1.7 - 7.7 K/uL Final  . Lymphocytes Relative 09/07/2013 15  12 - 46 % Final  . Lymphs Abs 09/07/2013 1.8  0.7 - 4.0 K/uL Final  . Monocytes Relative 09/07/2013 5  3 - 12 % Final  . Monocytes Absolute 09/07/2013 0.5  0.1 - 1.0 K/uL Final  . Eosinophils Relative 09/07/2013 1  0 - 5 % Final  . Eosinophils Absolute 09/07/2013 0.2  0.0 - 0.7 K/uL Final  . Basophils Relative 09/07/2013 0  0 - 1 % Final  . Basophils Absolute 09/07/2013 0.0  0.0 - 0.1 K/uL Final    PATHOLOGY: Right lung metastasis resected 10/21/2012 did not reveal BRAF mutation..  Urinalysis    Component Value Date/Time   COLORURINE YELLOW 10/19/2012 1039   APPEARANCEUR CLEAR 10/19/2012 1039   LABSPEC 1.015 10/19/2012 1039   PHURINE 5.5 10/19/2012 1039   GLUCOSEU NEGATIVE 10/19/2012 1039   HGBUR NEGATIVE 10/19/2012 1039   BILIRUBINUR NEGATIVE 10/19/2012 1039   KETONESUR NEGATIVE 10/19/2012 1039   PROTEINUR NEGATIVE 10/19/2012 1039   UROBILINOGEN 0.2 10/19/2012 1039   NITRITE NEGATIVE 10/19/2012 1039   LEUKOCYTESUR NEGATIVE 10/19/2012 1039    RADIOGRAPHIC STUDIES: CT Abdomen Pelvis Wo Contrast Status: Final result         PACS Images    Show images for CT Abdomen Pelvis Wo Contrast         Study Result    CLINICAL DATA: History of melanoma. Surveillance for metastatic  disease.  EXAM:  CT CHEST, ABDOMEN AND PELVIS  WITHOUT CONTRAST  TECHNIQUE:  Multidetector CT imaging of the chest, abdomen and pelvis was  performed following  the standard protocol without IV contrast.  COMPARISON: CT of the chest, abdomen and pelvis 04/05/2013. CT of  the abdomen and pelvis 01/10/2013.  FINDINGS:  CT CHEST FINDINGS  Mediastinum: Heart size is normal. There is no significant  pericardial fluid, thickening or pericardial calcification. No  pathologically enlarged mediastinal or hilar lymph nodes. Please  note that accurate exclusion of hilar adenopathy is limited on  noncontrast CT scans. Esophagus is unremarkable in appearance. There  is atherosclerosis of the thoracic aorta, the great vessels of the  mediastinum and the coronary arteries, including calcified  atherosclerotic plaque in the left main, left anterior descending,  left circumflex and right coronary arteries.  Lungs/Pleura: 5 mm subpleural nodule in the posterior aspect of the  left lower lobe (image 42 of series 3) is unchanged. 3 mm right  upper lobe nodule noted on the prior study is not clearly identified  on today's examination, likely obscured by motion. 5 mm nodule in  the anterior aspect of the left lower lobe (image 36 of series 3) is  unchanged. No other suspicious appearing pulmonary nodules or masses  are identified. Postoperative changes of wedge resection in the  right upper lobe are again noted. No acute consolidative airspace  disease. No pleural effusions.  Musculoskeletal: There are no aggressive appearing lytic or blastic  lesions noted in the visualized portions of the skeleton.  CT ABDOMEN AND PELVIS FINDINGS  Abdomen/Pelvis: Mild diffuse decreased attenuation throughout the  hepatic parenchyma, compatible with hepatic steatosis. The  unenhanced appearance of the gallbladder, pancreas, spleen,  bilateral adrenal glands and bilateral kidneys is unremarkable. No  significant volume of ascites. No pneumoperitoneum. No pathologic   distention of small bowel. No definite lymphadenopathy identified  within the abdomen or pelvis on today's non contrast CT examination.  Numerous colonic diverticulae are noted, most pronounced in the  region of the sigmoid colon, without surrounding inflammatory  changes to suggest an acute diverticulitis at this time. Status post  hysterectomy. Ovaries are unremarkable in appearance. Urinary  bladder is normal in appearance.  Musculoskeletal: There are no aggressive appearing lytic or blastic  lesions noted in the visualized portions of the skeleton.  IMPRESSION:  1. No definite findings to suggest metastatic disease to the chest,  abdomen or pelvis.  2. Multiple previously demonstrated small pulmonary nodules appear  similar in size, number and pattern of distribution to the prior  examination, and are favored to be benign, although continued  attention on future followup studies is recommended.  3. Status post right upper lobe wedge resection without evidence to  suggest recurrence of previously noted right upper lobe nodule.  4. Atherosclerosis, including left main and 3 vessel coronary artery  disease. Please note that although the presence of coronary artery  calcium documents the presence of coronary artery disease, the  severity of this disease and any potential stenosis cannot be  assessed on this non-gated CT examination. Assessment for potential  risk factor modification, dietary therapy or pharmacologic therapy  may be warranted, if clinically indicated.  5. Hepatic steatosis.  6. Colonic diverticulosis without findings to suggest acute  diverticulitis at this time.  Electronically Signed  By: Vinnie Langton M.D.  On: 07/07/2013 14:     ASSESSMENT:  1. Metastatic melanoma to the right lung status post resection by Dr. Servando Snare on 10/21/2012, BRAF mutation not detected. Follow-up CT scans thus far negative for recurrence.  2. Jak2 + Polycythemia Vera    PLAN:   #1. Phlebotomy for hemoglobin  greater than 17. #2. Repeat CT of the chest without contrast 10/05/2013 #3. Followup in 2 months with CBC   All questions were answered. The patient knows to call the clinic with any problems, questions or concerns. We can certainly see the patient much sooner if necessary.   I spent 25 minutes counseling the patient face to face. The total time spent in the appointment was 30 minutes.    Doroteo Bradford, MD 09/07/2013 3:55 PM

## 2013-09-07 NOTE — Patient Instructions (Signed)
[facility] Discharge Instructions  RECOMMENDATIONS MADE BY THE CONSULTANT AND ANY TEST RESULTS WILL BE SENT TO YOUR REFERRING PHYSICIAN.  Return for office visit and blood work in 2 months. CT scans scheduled for next month.   Thank you for choosing [facility] to provide your oncology and hematology care.  To afford each patient quality time with our providers, please arrive at least 15 minutes before your scheduled appointment time.  With your help, our goal is to use those 15 minutes to complete the necessary work-up to ensure our physicians have the information they need to help with your evaluation and healthcare recommendations.    Effective January 1st, 2014, we ask that you re-schedule your appointment with our physicians should you arrive 10 or more minutes late for your appointment.  We strive to give you quality time with our providers, and arriving late affects you and other patients whose appointments are after yours.    Again, thank you for choosing [facility].  Our hope is that these requests will decrease the amount of time that you wait before being seen by our physicians.       _____________________________________________________________  Should you have questions after your visit to [facility], please contact our office at [phone] between the hours of 8:30 a.m. and 5:00 p.m.  Voicemails left after 4:30 p.m. will not be returned until the following business day.  For prescription refill requests, have your pharmacy contact our office with your prescription refill request.

## 2013-09-07 NOTE — Progress Notes (Signed)
Katelyn Cline presented for labwork. Labs per MD order drawn via Peripheral Line 23 gauge needle inserted in left anterior wrist.  Good blood return present. Procedure without incident.  Needle removed intact. Patient tolerated procedure well.

## 2013-09-11 ENCOUNTER — Other Ambulatory Visit: Payer: Self-pay | Admitting: *Deleted

## 2013-09-11 MED ORDER — TRIAMTERENE-HCTZ 37.5-25 MG PO CAPS: 1.0000 | ORAL_CAPSULE | ORAL | Status: DC

## 2013-09-11 NOTE — Telephone Encounter (Signed)
Medication filled x1 with no refills.   Requires office visit before any further refills can be given.  

## 2013-10-05 ENCOUNTER — Ambulatory Visit (HOSPITAL_COMMUNITY): Payer: Medicare HMO

## 2013-10-05 ENCOUNTER — Ambulatory Visit: Payer: Medicare HMO | Admitting: Cardiothoracic Surgery

## 2013-10-12 ENCOUNTER — Ambulatory Visit (HOSPITAL_COMMUNITY): Payer: Medicare HMO

## 2013-10-12 ENCOUNTER — Ambulatory Visit (HOSPITAL_COMMUNITY): Admission: RE | Admit: 2013-10-12 | Payer: Medicare HMO | Source: Ambulatory Visit

## 2013-10-13 ENCOUNTER — Ambulatory Visit: Payer: Medicare HMO | Admitting: Family Medicine

## 2013-10-15 ENCOUNTER — Inpatient Hospital Stay (HOSPITAL_COMMUNITY)
Admission: EM | Admit: 2013-10-15 | Discharge: 2013-10-18 | DRG: 690 | Disposition: A | Discharge status: Home / Self Care | Payer: Medicare HMO | Attending: Internal Medicine | Admitting: Internal Medicine

## 2013-10-15 ENCOUNTER — Emergency Department (HOSPITAL_COMMUNITY): Payer: Medicare HMO

## 2013-10-15 ENCOUNTER — Encounter (HOSPITAL_COMMUNITY): Payer: Self-pay | Admitting: Emergency Medicine

## 2013-10-15 DIAGNOSIS — E119 Type 2 diabetes mellitus without complications: Secondary | ICD-10-CM | POA: Diagnosis present

## 2013-10-15 DIAGNOSIS — F329 Major depressive disorder, single episode, unspecified: Secondary | ICD-10-CM | POA: Diagnosis present

## 2013-10-15 DIAGNOSIS — F4001 Agoraphobia with panic disorder: Secondary | ICD-10-CM | POA: Diagnosis present

## 2013-10-15 DIAGNOSIS — R0789 Other chest pain: Secondary | ICD-10-CM

## 2013-10-15 DIAGNOSIS — Z85820 Personal history of malignant melanoma of skin: Secondary | ICD-10-CM | POA: Diagnosis not present

## 2013-10-15 DIAGNOSIS — M545 Low back pain, unspecified: Secondary | ICD-10-CM | POA: Diagnosis present

## 2013-10-15 DIAGNOSIS — D45 Polycythemia vera: Secondary | ICD-10-CM | POA: Diagnosis present

## 2013-10-15 DIAGNOSIS — N10 Acute tubulo-interstitial nephritis: Secondary | ICD-10-CM | POA: Diagnosis not present

## 2013-10-15 DIAGNOSIS — Z880 Allergy status to penicillin: Secondary | ICD-10-CM

## 2013-10-15 DIAGNOSIS — M4120 Other idiopathic scoliosis, site unspecified: Secondary | ICD-10-CM | POA: Diagnosis present

## 2013-10-15 DIAGNOSIS — C439 Malignant melanoma of skin, unspecified: Secondary | ICD-10-CM | POA: Diagnosis present

## 2013-10-15 DIAGNOSIS — Z9981 Dependence on supplemental oxygen: Secondary | ICD-10-CM | POA: Diagnosis present

## 2013-10-15 DIAGNOSIS — G8929 Other chronic pain: Secondary | ICD-10-CM | POA: Diagnosis present

## 2013-10-15 DIAGNOSIS — Z85828 Personal history of other malignant neoplasm of skin: Secondary | ICD-10-CM | POA: Diagnosis not present

## 2013-10-15 DIAGNOSIS — J9611 Chronic respiratory failure with hypoxia: Secondary | ICD-10-CM | POA: Diagnosis present

## 2013-10-15 DIAGNOSIS — J9610 Chronic respiratory failure, unspecified whether with hypoxia or hypercapnia: Secondary | ICD-10-CM | POA: Diagnosis present

## 2013-10-15 DIAGNOSIS — G609 Hereditary and idiopathic neuropathy, unspecified: Secondary | ICD-10-CM | POA: Diagnosis present

## 2013-10-15 DIAGNOSIS — Z8249 Family history of ischemic heart disease and other diseases of the circulatory system: Secondary | ICD-10-CM | POA: Diagnosis not present

## 2013-10-15 DIAGNOSIS — G894 Chronic pain syndrome: Secondary | ICD-10-CM | POA: Diagnosis present

## 2013-10-15 DIAGNOSIS — G4733 Obstructive sleep apnea (adult) (pediatric): Secondary | ICD-10-CM | POA: Diagnosis present

## 2013-10-15 DIAGNOSIS — R109 Unspecified abdominal pain: Secondary | ICD-10-CM | POA: Diagnosis not present

## 2013-10-15 DIAGNOSIS — R0902 Hypoxemia: Secondary | ICD-10-CM

## 2013-10-15 DIAGNOSIS — Z823 Family history of stroke: Secondary | ICD-10-CM | POA: Diagnosis not present

## 2013-10-15 DIAGNOSIS — IMO0001 Reserved for inherently not codable concepts without codable children: Secondary | ICD-10-CM | POA: Diagnosis present

## 2013-10-15 DIAGNOSIS — Z818 Family history of other mental and behavioral disorders: Secondary | ICD-10-CM | POA: Diagnosis not present

## 2013-10-15 DIAGNOSIS — Z87891 Personal history of nicotine dependence: Secondary | ICD-10-CM | POA: Diagnosis not present

## 2013-10-15 DIAGNOSIS — E785 Hyperlipidemia, unspecified: Secondary | ICD-10-CM | POA: Diagnosis present

## 2013-10-15 DIAGNOSIS — Z79899 Other long term (current) drug therapy: Secondary | ICD-10-CM

## 2013-10-15 DIAGNOSIS — I10 Essential (primary) hypertension: Secondary | ICD-10-CM

## 2013-10-15 DIAGNOSIS — F32A Depression, unspecified: Secondary | ICD-10-CM | POA: Diagnosis present

## 2013-10-15 DIAGNOSIS — N12 Tubulo-interstitial nephritis, not specified as acute or chronic: Secondary | ICD-10-CM

## 2013-10-15 HISTORY — DX: Type 2 diabetes mellitus without complications: E11.9

## 2013-10-15 HISTORY — DX: Chronic respiratory failure with hypoxia: J96.11

## 2013-10-15 LAB — URINE MICROSCOPIC-ADD ON

## 2013-10-15 LAB — COMPREHENSIVE METABOLIC PANEL
ALBUMIN: 3.3 g/dL — AB (ref 3.5–5.2)
ALT: 15 U/L (ref 0–35)
AST: 15 U/L (ref 0–37)
Alkaline Phosphatase: 151 U/L — ABNORMAL HIGH (ref 39–117)
BUN: 8 mg/dL (ref 6–23)
CALCIUM: 9.3 mg/dL (ref 8.4–10.5)
CO2: 30 meq/L (ref 19–32)
Chloride: 99 mEq/L (ref 96–112)
Creatinine, Ser: 0.59 mg/dL (ref 0.50–1.10)
GFR calc Af Amer: >90 mL/min (ref >90)
Glucose, Bld: 219 mg/dL — ABNORMAL HIGH (ref 70–99)
Potassium: 3.7 mEq/L (ref 3.7–5.3)
SODIUM: 141 meq/L (ref 137–147)
TOTAL PROTEIN: 7.5 g/dL (ref 6.0–8.3)
Total Bilirubin: 0.3 mg/dL (ref 0.3–1.2)

## 2013-10-15 LAB — URINALYSIS, ROUTINE W REFLEX MICROSCOPIC
BILIRUBIN URINE: NEGATIVE
GLUCOSE, UA: NEGATIVE mg/dL
Ketones, ur: NEGATIVE mg/dL
Leukocytes, UA: NEGATIVE
Nitrite: NEGATIVE
PH: 6 (ref 5.0–8.0)
Protein, ur: 30 mg/dL — AB
Specific Gravity, Urine: 1.02 (ref 1.005–1.030)
Urobilinogen, UA: 0.2 mg/dL (ref 0.0–1.0)

## 2013-10-15 LAB — CBC WITH DIFFERENTIAL/PLATELET
BASOS ABS: 0 10*3/uL (ref 0.0–0.1)
BASOS PCT: 0 % (ref 0–1)
EOS PCT: 1 % (ref 0–5)
Eosinophils Absolute: 0.1 10*3/uL (ref 0.0–0.7)
HCT: 45.1 % (ref 36.0–46.0)
Hemoglobin: 14.6 g/dL (ref 12.0–15.0)
LYMPHS PCT: 8 % — AB (ref 12–46)
Lymphs Abs: 1.1 10*3/uL (ref 0.7–4.0)
MCH: 28.4 pg (ref 26.0–34.0)
MCHC: 32.4 g/dL (ref 30.0–36.0)
MCV: 87.7 fL (ref 78.0–100.0)
Monocytes Absolute: 0.9 10*3/uL (ref 0.1–1.0)
Monocytes Relative: 7 % (ref 3–12)
Neutro Abs: 11.3 10*3/uL — ABNORMAL HIGH (ref 1.7–7.7)
Neutrophils Relative %: 84 % — ABNORMAL HIGH (ref 43–77)
PLATELETS: 259 10*3/uL (ref 150–400)
RBC: 5.14 MIL/uL — ABNORMAL HIGH (ref 3.87–5.11)
RDW: 17.7 % — ABNORMAL HIGH (ref 11.5–15.5)
WBC: 13.5 10*3/uL — AB (ref 4.0–10.5)

## 2013-10-15 LAB — HEMOGLOBIN A1C
HEMOGLOBIN A1C: 7.6 % — AB (ref <5.7)
Mean Plasma Glucose: 171 mg/dL — ABNORMAL HIGH (ref <117)

## 2013-10-15 LAB — TROPONIN I: Troponin I: <0.3 ng/mL (ref <0.30)

## 2013-10-15 LAB — GLUCOSE, CAPILLARY
GLUCOSE-CAPILLARY: 224 mg/dL — AB (ref 70–99)
Glucose-Capillary: 288 mg/dL — ABNORMAL HIGH (ref 70–99)
Glucose-Capillary: 219 mg/dL — ABNORMAL HIGH (ref 70–99)

## 2013-10-15 MED ORDER — DULOXETINE HCL 60 MG PO CPEP
120.0000 mg | ORAL_CAPSULE | Freq: Every day | ORAL | Status: DC
  Administered 2013-10-15 – 2013-10-18 (×4): 120 mg via ORAL
  Filled 2013-10-15 (×4): qty 2

## 2013-10-15 MED ORDER — ONDANSETRON HCL 4 MG/2ML IJ SOLN: 4.0000 mg | Freq: Four times a day (QID) | INTRAMUSCULAR | Status: DC | PRN

## 2013-10-15 MED ORDER — ACETAMINOPHEN 325 MG PO TABS: 650.0000 mg | ORAL_TABLET | Freq: Four times a day (QID) | ORAL | Status: DC | PRN

## 2013-10-15 MED ORDER — GABAPENTIN 300 MG PO CAPS: 300.0000 mg | ORAL_CAPSULE | Freq: Three times a day (TID) | ORAL | Status: DC

## 2013-10-15 MED ORDER — SODIUM CHLORIDE 0.9 % IV SOLN
INTRAVENOUS | Status: DC
Start: 2013-10-15 — End: 2013-10-15

## 2013-10-15 MED ORDER — HYDROMORPHONE HCL PF 1 MG/ML IJ SOLN: 1.0000 mg | INTRAMUSCULAR | Status: DC | PRN

## 2013-10-15 MED ORDER — MORPHINE SULFATE 4 MG/ML IJ SOLN
INTRAMUSCULAR | Status: AC
  Administered 2013-10-15: 4 mg
  Filled 2013-10-15: qty 1

## 2013-10-15 MED ORDER — ENOXAPARIN SODIUM 40 MG/0.4ML ~~LOC~~ SOLN
40.0000 mg | Freq: Every day | SUBCUTANEOUS | Status: DC
  Administered 2013-10-15 – 2013-10-18 (×4): 40 mg via SUBCUTANEOUS
  Filled 2013-10-15 (×4): qty 0.4

## 2013-10-15 MED ORDER — DEXTROSE 5 % IV SOLN
1.0000 g | INTRAVENOUS | Status: DC
  Administered 2013-10-16 – 2013-10-18 (×3): 1 g via INTRAVENOUS
  Filled 2013-10-15 (×4): qty 10

## 2013-10-15 MED ORDER — ATORVASTATIN CALCIUM 40 MG PO TABS
40.0000 mg | ORAL_TABLET | Freq: Every day | ORAL | Status: DC
  Administered 2013-10-15 – 2013-10-18 (×4): 40 mg via ORAL
  Filled 2013-10-15 (×4): qty 1

## 2013-10-15 MED ORDER — OXYCODONE-ACETAMINOPHEN 5-325 MG PO TABS
1.0000 | ORAL_TABLET | Freq: Four times a day (QID) | ORAL | Status: DC | PRN
  Administered 2013-10-15 – 2013-10-18 (×10): 1 via ORAL
  Filled 2013-10-15 (×10): qty 1

## 2013-10-15 MED ORDER — SODIUM CHLORIDE 0.9 % IJ SOLN
3.0000 mL | Freq: Two times a day (BID) | INTRAMUSCULAR | Status: DC
  Administered 2013-10-16 – 2013-10-17 (×3): 3 mL via INTRAVENOUS

## 2013-10-15 MED ORDER — ACETAMINOPHEN 650 MG RE SUPP: 650.0000 mg | Freq: Four times a day (QID) | RECTAL | Status: DC | PRN

## 2013-10-15 MED ORDER — POTASSIUM CHLORIDE CRYS ER 20 MEQ PO TBCR
20.0000 meq | EXTENDED_RELEASE_TABLET | Freq: Every day | ORAL | Status: DC
  Administered 2013-10-15 – 2013-10-18 (×4): 20 meq via ORAL
  Filled 2013-10-15 (×4): qty 1

## 2013-10-15 MED ORDER — ONDANSETRON HCL 4 MG/2ML IJ SOLN: 4.0000 mg | Freq: Three times a day (TID) | INTRAMUSCULAR | Status: DC | PRN

## 2013-10-15 MED ORDER — MORPHINE SULFATE 4 MG/ML IJ SOLN
4.0000 mg | Freq: Once | INTRAMUSCULAR | Status: AC
  Administered 2013-10-15: 4 mg via INTRAVENOUS
  Filled 2013-10-15: qty 1

## 2013-10-15 MED ORDER — INSULIN ASPART 100 UNIT/ML ~~LOC~~ SOLN
0.0000 [IU] | Freq: Three times a day (TID) | SUBCUTANEOUS | Status: DC
Start: 2013-10-15 — End: 2013-10-16
  Administered 2013-10-15: 3 [IU] via SUBCUTANEOUS
  Administered 2013-10-16 (×2): 2 [IU] via SUBCUTANEOUS

## 2013-10-15 MED ORDER — SODIUM CHLORIDE 0.9 % IV BOLUS (SEPSIS)
1000.0000 mL | Freq: Once | INTRAVENOUS | Status: AC
  Administered 2013-10-15: 1000 mL via INTRAVENOUS

## 2013-10-15 MED ORDER — TRIAMTERENE-HCTZ 37.5-25 MG PO CAPS
1.0000 | ORAL_CAPSULE | Freq: Every day | ORAL | Status: DC
  Administered 2013-10-15 – 2013-10-18 (×4): 1 via ORAL
  Filled 2013-10-15 (×8): qty 1

## 2013-10-15 MED ORDER — ONDANSETRON HCL 4 MG PO TABS: 4.0000 mg | ORAL_TABLET | Freq: Four times a day (QID) | ORAL | Status: DC | PRN

## 2013-10-15 MED ORDER — MORPHINE SULFATE 2 MG/ML IJ SOLN
2.0000 mg | INTRAMUSCULAR | Status: DC | PRN
  Administered 2013-10-15 – 2013-10-17 (×5): 2 mg via INTRAVENOUS
  Filled 2013-10-15 (×5): qty 1

## 2013-10-15 MED ORDER — ONDANSETRON HCL 4 MG/2ML IJ SOLN
4.0000 mg | Freq: Once | INTRAMUSCULAR | Status: AC
  Administered 2013-10-15: 4 mg via INTRAVENOUS
  Filled 2013-10-15: qty 2

## 2013-10-15 MED ORDER — DEXTROSE 5 % IV SOLN
1.0000 g | Freq: Once | INTRAVENOUS | Status: AC
  Administered 2013-10-15: 1 g via INTRAVENOUS
  Filled 2013-10-15: qty 10

## 2013-10-15 MED ORDER — SODIUM CHLORIDE 0.9 % IV SOLN: 250.0000 mL | INTRAVENOUS | Status: DC | PRN

## 2013-10-15 MED ORDER — AMLODIPINE BESYLATE 5 MG PO TABS
5.0000 mg | ORAL_TABLET | Freq: Every day | ORAL | Status: DC
  Administered 2013-10-15 – 2013-10-18 (×4): 5 mg via ORAL
  Filled 2013-10-15 (×4): qty 1

## 2013-10-15 MED ORDER — SODIUM CHLORIDE 0.9 % IJ SOLN: 3.0000 mL | INTRAMUSCULAR | Status: DC | PRN

## 2013-10-15 NOTE — H&P (Signed)
History and Physical  Katelyn Cline NID:[id number] DOB: 10-05-1960 DOA: 10/15/2013  Referring physician: Dr. Julianne Rice in ED PCP: [name], MD   Chief Complaint: pain when urinating  HPI:  52 year old woman with complex past medical history presents the emergency department with 2-3 day history of worsening dysuria with bilateral flank pain and low-grade fever. Initial evaluation suggested UTI, clinical exam suggested pyelonephritis.  Patient reports she had episode of vomiting 4/17. She then noticed severe dysuria, suprapubic pain and possible difficulty emptying that worsenedt 4/18. Associated with low-grade fever less than 101 and bilateral flank pain. No vomiting today. She was seen in urgent care 4/18 and placed on Keflex. She has been able to tolerate this medication without difficulty. However her symptoms did not improve. She has a history of allergy to penicillin "when I was a baby" reaction unknown.  She reports an episode of chest pain 4/18 with no recurrence. She thinks this is associated with a panic attack. She reports intermittent chest pain with panic attacks. She has chronic hypoxic respiratory failure from polycythemia and her respiratory status has been stable. She has had no new swelling of her legs except some left ankle edema from a recent ankle injury.  In the emergency department temperature 100.4. Vitals otherwise stable. Stable hypoxia on 3 L. Complete metabolic panel unremarkable. Troponin negative. WBC 13.5. Left shift. Remainder of CBC unremarkable. Random blood sugar 219. Urinalysis grossly positive. Chest x-ray no acute disease. EKG normal sinus rhythm, septal infarct, old compared to previous study 10/19/2012. Independent review. Treated with Rocephin in the emergency department without reaction.  Review of Systems:  Negative for  visual changes, sore throat, rash, new muscle aches, new SOB, bleeding, vomiting/abdominal pain.  Past Medical History    Diagnosis Date  . Narcolepsy   . HTN (hypertension)   . Fibromyalgia   . Scoliosis   . Migraines   . Major depression   . Panic attacks   . Agoraphobia   . Chronic pain   . Miscarriage     x 4  . Peripheral neuropathy   . Hyperlipidemia   . Elevated hemoglobin 2014  . Vertigo   . Polycythemia secondary to smoking   . Dysrhythmia     Hx: of palpitations "a long time ago"  . Shortness of breath     Hx: of with activity  . Sleep apnea   . Basal cell carcinoma   . Polycythemia vera(238.4)   . Metastatic melanoma 10/18/2012    12 mm posterior right upper lobe pulmonary nodule, max SUV 3.0    . Chronic respiratory failure with hypoxia     3L [place]  . Peripheral neuropathy     in feet    Past Surgical History  Procedure Laterality Date  . Vaginal hysterectomy    . Cystectomy      abdominal wall   . Basal cell carcinoma excision      flap surgery on face  . Colonoscopy w/ biopsies and polypectomy      Hx: of  . Dilation and curettage of uterus      Hx: of   . Video assisted thoracoscopy (vats)/wedge resection Right 10/21/2012    Procedure: VIDEO ASSISTED THORACOSCOPY (VATS)/WEDGE RESECTION;  Surgeon: [name], MD;  Location: [place];  Service: Thoracic;  Laterality: Right;  . Video bronchoscopy N/A 10/21/2012    Procedure: VIDEO BRONCHOSCOPY;  Surgeon: [name], MD;  Location: [place];  Service: Thoracic;  Laterality: N/A;  . Breast  lumpectomy      bilateral    Social History:  reports that she quit smoking about 12 months ago. Her smoking use included Cigarettes. She has a 10 pack-year smoking history. She has never used smokeless tobacco. She reports that she drinks alcohol. She reports that she does not use illicit drugs.  Allergies  Allergen Reactions  . Contrast Media [Iodinated Diagnostic Agents] Anaphylaxis  . Flagyl [Metronidazole]     Generalized pain.  Marland Kitchen Penicillins     Patient was an infant, no idea of reaction. Tolerates Keflex.  .  Tetracyclines & Related     GI side effects    Family History  Problem Relation Age of Onset  . Arthritis Mother   . Hyperlipidemia Mother   . Depression Mother   . Anxiety disorder Mother   . Dementia Mother   . Hypertension Father   . Hyperlipidemia Father   . Heart disease Father   . Stroke Father   . Dementia Father   . Heart disease Brother   . Alcohol abuse Maternal Grandfather   . Bipolar disorder Neg Hx   . Drug abuse Neg Hx   . OCD Neg Hx   . Paranoid behavior Neg Hx   . Schizophrenia Neg Hx   . Seizures Neg Hx   . Sexual abuse Neg Hx   . Physical abuse Neg Hx   . ADD / ADHD Son      Prior to Admission medications   Medication Sig Start Date End Date Taking? Authorizing Provider  amLODipine (NORVASC) 5 MG tablet Take 1 tablet (5 mg total) by mouth daily. 02/06/13   [name], MD  aspirin-acetaminophen-caffeine (EXCEDRIN MIGRAINE) [phone] MG per tablet Take 1 tablet by mouth every 6 (six) hours as needed for pain.    Historical Provider, MD  atorvastatin (LIPITOR) 40 MG tablet Take 1 tablet (40 mg total) by mouth daily. 02/06/13   [name], MD  Cholecalciferol (VITAMIN D) 2000 UNITS CAPS Take 1 capsule by mouth daily.    Historical Provider, MD  DULoxetine (CYMBALTA) 60 MG capsule Take 2 capsules (120 mg total) by mouth daily. 06/27/13   [name], MD  fish oil-omega-3 fatty acids 1000 MG capsule Take 2 g by mouth 2 (two) times daily.    Historical Provider, MD  gabapentin (NEURONTIN) 100 MG capsule Take 3 capsules (300 mg total) by mouth 3 (three) times daily. 02/06/13   [name], MD  HYDROcodone-acetaminophen (NORCO) 5-325 MG per tablet Take 1 tablet by mouth every 4 (four) hours as needed for pain. 04/26/13   [name], MD  ibuprofen (ADVIL,MOTRIN) 800 MG tablet Take 800 mg by mouth every 8 (eight) hours as needed for pain.    Historical Provider, MD  modafinil (PROVIGIL) 200 MG tablet Take 200 mg by mouth as needed.      Historical Provider, MD  oxyCODONE-acetaminophen (PERCOCET/ROXICET) 5-325 MG per tablet Take 1 tablet by mouth every 6 (six) hours as needed for severe pain. 07/07/13   Lenox Ahr, PA-C  potassium chloride SA (K-DUR,KLOR-CON) 20 MEQ tablet Take 1 tablet (20 mEq total) by mouth daily. 06/19/13   Baird Cancer, PA-C  triamterene-hydrochlorothiazide (DYAZIDE) 37.5-25 MG per capsule Take 1 each (1 capsule total) by mouth every morning. 09/11/13   [name], MD   Physical Exam: Filed Vitals:   10/15/13 0407 10/15/13 0637  BP: 179/85 150/53  Pulse: 100 87  Temp: 98.9 F (37.2 C) 100.4 F (  38 C)  TempSrc: Oral Oral  Resp: 24   Height: 5\' 8"  (1.727 m)   Weight: 99.791 kg (220 lb)   SpO2: 98% 97%   General: Examined in the emergency department. Appears calm and comfortable Eyes: PERRL, normal lids, irises  ENT: grossly normal hearing, lips. Well-healed scar over the forehead extending into the nose consistent with history of basal skin cancer resection. Neck: no LAD, masses or thyromegaly Cardiovascular: RRR, no m/r/g. No significant LE edema. Respiratory: Generally clear, no wheezes or rales. Few rhonchi both lungs. Normal respiratory effort. Abdomen: Obese, soft, ntnd except for suprapubic pain. Some bilateral costovertebral angle tenderness. Skin: no rash or induration seen  Musculoskeletal: grossly normal tone BUE/BLE Psychiatric: grossly normal mood and affect, speech fluent and appropriate Neurologic: grossly non-focal.  Wt Readings from Last 3 Encounters:  10/15/13 99.791 kg (220 lb)  09/07/13 121.11 kg (267 lb)  07/07/13 122.471 kg (270 lb)    Labs on Admission:  Basic Metabolic Panel:  Recent Labs Lab 10/15/13 0537  NA 141  K 3.7  CL 99  CO2 30  GLUCOSE 219*  BUN 8  CREATININE 0.59  CALCIUM 9.3    Liver Function Tests:  Recent Labs Lab 10/15/13 0537  AST 15  ALT 15  ALKPHOS 151*  BILITOT 0.3  PROT 7.5  ALBUMIN 3.3*    CBC:  Recent  Labs Lab 10/15/13 0537  WBC 13.5*  NEUTROABS 11.3*  HGB 14.6  HCT 45.1  MCV 87.7  PLT 259    Cardiac Enzymes:  Recent Labs Lab 10/15/13 0537  TROPONINI <0.30    Radiological Exams on Admission: Dg Chest 2 View  10/15/2013   CLINICAL DATA:  Chest pain, back pain, fever. History of metastatic melanoma.  EXAM: CHEST  2 VIEW  COMPARISON:  CT ABD/PELV WO CM dated 07/07/2013; DG CHEST 2 VIEW dated 11/28/2012  FINDINGS: Normal heart size and pulmonary vascularity. Linear scarring in the right upper lung. Mild hyperinflation. No focal airspace disease or consolidation in the lungs. No focal parenchymal nodules or masses identified. Hilar structures are symmetrical. No change since previous study.  IMPRESSION: No active cardiopulmonary disease.   Electronically Signed   By: Lucienne Capers M.D.   On: 10/15/2013 05:22    Principal Problem:   Acute pyelonephritis Active Problems:   Chronic pain disorder   Depression   Polycythemia   Metastatic melanoma   Chronic hypoxemic respiratory failure   Pyelonephritis   Hyperglycemia   Assessment/Plan 1. Acute pyelonephritis. History of dysuria, bilateral flank pain, nausea and low-grade fever. Tolerated Rocephin. 2. Chest 4/18. No recurrence. Patient feels related to her panic attacks. This is an intermittent, chronic issue. Troponin was negative. EKG nonacute. Well's = 1. No further evaluation is suggested. VTE not suspected. 3. Random hyperglycemia. 4. Polycythemia. Appears to be stable. 5. Chronic hypoxic respiratory failure, stable on 3 L nasal cannula. 6. History of melanoma of the lung. Was due for CT scan. Will notify oncology in the morning. 7. Chronic pain secondary to scoliosis. 8. Obstructive sleep apnea. Patient is not to use CPAP. 9. Depression, panic attacks, agoraphobia. There is stable.   Plan admission to the medical floor, continue Rocephin which was tolerated in the emergency department. Send urine  culture.  Sliding-scale insulin. Check hemoglobin A1c.  Continue oxygen.  Notify oncology in the morning to assist with coordination of requested outpatient diagnostic studies.  Code Status: full code DVT prophylaxis:Lovenox Family Communication: son Disposition Plan/Anticipated LOS: admit 2-3 days  Time spent:  60 minutes  Murray Hodgkins, MD  [facility] Pager [phone] 10/15/2013, 8:02 AM

## 2013-10-15 NOTE — ED Provider Notes (Signed)
CSN: [id number]     Arrival date & time 10/15/13  0351 History   First MD Initiated Contact with Patient 10/15/13 0410     Chief Complaint  Patient presents with  . Chest Pain    onset was 1.5 hr ago.  . Back Pain    back pain started yesterday  . Fever    temp was100.6     (Consider location/radiation/quality/duration/timing/severity/associated sxs/prior Treatment) HPI Patient has had several days of dysuria, frequency. She was seen in the urgent care clinic and diagnosed with urinary tract infection. She was prescribed Keflex has taken 2 doses. She is now having left-sided lower back pain, low-grade fever associated with nausea and vomiting. Dysuria is mildly improved. Patient is also claiming to have intermittent chest pressure. She states it's worse with nausea. It's similar to previous panic attack symptoms. The pain does not radiate. It is not associated with any shortness of breath. Past Medical History  Diagnosis Date  . Narcolepsy   . HTN (hypertension)   . Fibromyalgia   . Scoliosis   . Migraines   . Major depression   . Panic attacks   . Agoraphobia   . Chronic pain   . Miscarriage     x 4  . Peripheral neuropathy   . Hyperlipidemia   . Elevated hemoglobin 2014  . Vertigo   . Polycythemia secondary to smoking   . Dysrhythmia     Hx: of palpitations "a long time ago"  . Shortness of breath     Hx: of with activity  . Sleep apnea   . Basal cell carcinoma   . Polycythemia vera(238.4)   . Metastatic melanoma 10/18/2012    12 mm posterior right upper lobe pulmonary nodule, max SUV 3.0     Past Surgical History  Procedure Laterality Date  . Vaginal hysterectomy    . Cystectomy      abdominal wall   . Basal cell carcinoma excision    . Colonoscopy w/ biopsies and polypectomy      Hx: of  . Dilation and curettage of uterus      Hx: of   . Video assisted thoracoscopy (vats)/wedge resection Right 10/21/2012    Procedure: VIDEO ASSISTED THORACOSCOPY (VATS)/WEDGE  RESECTION;  Surgeon: [name], MD;  Location: [place];  Service: Thoracic;  Laterality: Right;  . Video bronchoscopy N/A 10/21/2012    Procedure: VIDEO BRONCHOSCOPY;  Surgeon: [name], MD;  Location: [facility];  Service: Thoracic;  Laterality: N/A;  . Breast lumpectomy      bilateral   Family History  Problem Relation Age of Onset  . Arthritis Mother   . Hyperlipidemia Mother   . Depression Mother   . Anxiety disorder Mother   . Dementia Mother   . Hypertension Father   . Hyperlipidemia Father   . Heart disease Father   . Stroke Father   . Dementia Father   . Heart disease Brother   . Alcohol abuse Maternal Grandfather   . Bipolar disorder Neg Hx   . Drug abuse Neg Hx   . OCD Neg Hx   . Paranoid behavior Neg Hx   . Schizophrenia Neg Hx   . Seizures Neg Hx   . Sexual abuse Neg Hx   . Physical abuse Neg Hx   . ADD / ADHD Son    History  Substance Use Topics  . Smoking status: Former Smoker -- 2.00 packs/day for 5 years    Types: Cigarettes  Quit date: 09/16/2012  . Smokeless tobacco: Never Used  . Alcohol Use: Yes     Comment: 1 -2 drinks a month, occasional   OB History   Grav Para Term Preterm Abortions TAB SAB Ect Mult Living                 Review of Systems  Constitutional: Negative for fever and chills.  Respiratory: Negative for cough and shortness of breath.   Cardiovascular: Positive for chest pain. Negative for palpitations and leg swelling.  Gastrointestinal: Positive for nausea and vomiting. Negative for abdominal pain, diarrhea and constipation.  Genitourinary: Positive for dysuria and flank pain. Negative for hematuria, vaginal bleeding and vaginal discharge.  Musculoskeletal: Positive for back pain. Negative for neck pain and neck stiffness.  Skin: Negative for rash and wound.  Neurological: Negative for dizziness, weakness, light-headedness, numbness and headaches.  All other systems reviewed and are negative.     Allergies   Contrast media; Flagyl; Penicillins; and Tetracyclines & related  Home Medications   Prior to Admission medications   Medication Sig Start Date End Date Taking? Authorizing Provider  amLODipine (NORVASC) 5 MG tablet Take 1 tablet (5 mg total) by mouth daily. 02/06/13   [name], MD  aspirin-acetaminophen-caffeine (EXCEDRIN MIGRAINE) [phone] MG per tablet Take 1 tablet by mouth every 6 (six) hours as needed for pain.    Historical Provider, MD  atorvastatin (LIPITOR) 40 MG tablet Take 1 tablet (40 mg total) by mouth daily. 02/06/13   [name], MD  Cholecalciferol (VITAMIN D) 2000 UNITS CAPS Take 1 capsule by mouth daily.    Historical Provider, MD  DULoxetine (CYMBALTA) 60 MG capsule Take 2 capsules (120 mg total) by mouth daily. 06/27/13   [name], MD  fish oil-omega-3 fatty acids 1000 MG capsule Take 2 g by mouth 2 (two) times daily.    Historical Provider, MD  gabapentin (NEURONTIN) 100 MG capsule Take 3 capsules (300 mg total) by mouth 3 (three) times daily. 02/06/13   [name], MD  HYDROcodone-acetaminophen (NORCO) 5-325 MG per tablet Take 1 tablet by mouth every 4 (four) hours as needed for pain. 04/26/13   [name], MD  ibuprofen (ADVIL,MOTRIN) 800 MG tablet Take 800 mg by mouth every 8 (eight) hours as needed for pain.    Historical Provider, MD  modafinil (PROVIGIL) 200 MG tablet Take 200 mg by mouth as needed.     Historical Provider, MD  oxyCODONE-acetaminophen (PERCOCET/ROXICET) 5-325 MG per tablet Take 1 tablet by mouth every 6 (six) hours as needed for severe pain. 07/07/13   Lenox Ahr, PA-C  potassium chloride SA (K-DUR,KLOR-CON) 20 MEQ tablet Take 1 tablet (20 mEq total) by mouth daily. 06/19/13   Baird Cancer, PA-C  triamterene-hydrochlorothiazide (DYAZIDE) 37.5-25 MG per capsule Take 1 each (1 capsule total) by mouth every morning. 09/11/13   [name], MD   BP 150/53  Pulse 87  Temp(Src) 100.4 F (38 C) (Oral)  Resp 24   Ht 5\' 8"  (1.727 m)  Wt 220 lb (99.791 kg)  BMI 33.46 kg/m2  SpO2 97% Physical Exam  Nursing note and vitals reviewed. Constitutional: She is oriented to person, place, and time. She appears well-developed and well-nourished. No distress.  HENT:  Head: Normocephalic and atraumatic.  Mouth/Throat: Oropharynx is clear and moist.  Eyes: EOM are normal. Pupils are equal, round, and reactive to light.  Neck: Normal range of motion. Neck supple.  Cardiovascular: Normal rate  and regular rhythm.   Pulmonary/Chest: Effort normal and breath sounds normal. No respiratory distress. She has no wheezes. She has no rales. She exhibits no tenderness.  Abdominal: Soft. Bowel sounds are normal. She exhibits no distension and no mass. There is no tenderness. There is no rebound and no guarding.  Musculoskeletal: Normal range of motion. She exhibits no edema and no tenderness.  Left-sided CVA tenderness.  Neurological: She is alert and oriented to person, place, and time.  Moves all extremities without deficit. Sensation is grossly intact  Skin: Skin is warm and dry. No rash noted. No erythema.  Psychiatric: She has a normal mood and affect. Her behavior is normal.    ED Course  Procedures (including critical care time) Labs Review Labs Reviewed  CBC WITH DIFFERENTIAL - Abnormal; Notable for the following:    WBC 13.5 (*)    RBC 5.14 (*)    RDW 17.7 (*)    Neutrophils Relative % 84 (*)    Neutro Abs 11.3 (*)    Lymphocytes Relative 8 (*)    All other components within normal limits  COMPREHENSIVE METABOLIC PANEL - Abnormal; Notable for the following:    Glucose, Bld 219 (*)    Albumin 3.3 (*)    Alkaline Phosphatase 151 (*)    All other components within normal limits  URINALYSIS, ROUTINE W REFLEX MICROSCOPIC - Abnormal; Notable for the following:    APPearance HAZY (*)    Hgb urine dipstick SMALL (*)    Protein, ur 30 (*)    All other components within normal limits  URINE MICROSCOPIC-ADD  ON - Abnormal; Notable for the following:    Squamous Epithelial / LPF FEW (*)    Bacteria, UA FEW (*)    All other components within normal limits  TROPONIN I    Imaging Review Dg Chest 2 View  10/15/2013   CLINICAL DATA:  Chest pain, back pain, fever. History of metastatic melanoma.  EXAM: CHEST  2 VIEW  COMPARISON:  CT ABD/PELV WO CM dated 07/07/2013; DG CHEST 2 VIEW dated 11/28/2012  FINDINGS: Normal heart size and pulmonary vascularity. Linear scarring in the right upper lung. Mild hyperinflation. No focal airspace disease or consolidation in the lungs. No focal parenchymal nodules or masses identified. Hilar structures are symmetrical. No change since previous study.  IMPRESSION: No active cardiopulmonary disease.   Electronically Signed   By: Lucienne Capers M.D.   On: 10/15/2013 05:22     EKG Interpretation None      MDM   Final diagnoses:  Pyelonephritis  Atypical chest pain      Patient are multiple doses of narcotic pain medication to control her symptoms. Initial EKG and troponin within normal limits. I discussed with Dr. Sarajane Jews or he'll see the patient in emergency department. Will admit to MedSurg floor.   Julianne Rice, MD 10/15/13 [phone]

## 2013-10-15 NOTE — ED Notes (Signed)
Patient stated her back hurts worse than her chest and that she is afraid she will have a heart attack while she is thinking it is an anxiety attack

## 2013-10-15 NOTE — Progress Notes (Signed)
Utilization review Completed Ivon Roedel RN BSN   

## 2013-10-16 ENCOUNTER — Encounter (HOSPITAL_COMMUNITY): Payer: Self-pay | Admitting: Family Medicine

## 2013-10-16 DIAGNOSIS — N10 Acute tubulo-interstitial nephritis: Principal | ICD-10-CM

## 2013-10-16 DIAGNOSIS — E119 Type 2 diabetes mellitus without complications: Secondary | ICD-10-CM

## 2013-10-16 DIAGNOSIS — J9610 Chronic respiratory failure, unspecified whether with hypoxia or hypercapnia: Secondary | ICD-10-CM

## 2013-10-16 DIAGNOSIS — R0902 Hypoxemia: Secondary | ICD-10-CM

## 2013-10-16 HISTORY — DX: Type 2 diabetes mellitus without complications: E11.9

## 2013-10-16 LAB — BASIC METABOLIC PANEL
BUN: 10 mg/dL (ref 6–23)
CALCIUM: 9.2 mg/dL (ref 8.4–10.5)
CO2: 30 mEq/L (ref 19–32)
Chloride: 94 mEq/L — ABNORMAL LOW (ref 96–112)
Creatinine, Ser: 0.58 mg/dL (ref 0.50–1.10)
GFR calc non Af Amer: >90 mL/min (ref >90)
Glucose, Bld: 199 mg/dL — ABNORMAL HIGH (ref 70–99)
Potassium: 3.9 mEq/L (ref 3.7–5.3)
SODIUM: 137 meq/L (ref 137–147)

## 2013-10-16 LAB — GLUCOSE, CAPILLARY
GLUCOSE-CAPILLARY: 177 mg/dL — AB (ref 70–99)
Glucose-Capillary: 182 mg/dL — ABNORMAL HIGH (ref 70–99)
Glucose-Capillary: 160 mg/dL — ABNORMAL HIGH (ref 70–99)
Glucose-Capillary: 196 mg/dL — ABNORMAL HIGH (ref 70–99)

## 2013-10-16 LAB — CBC
HCT: 45 % (ref 36.0–46.0)
Hemoglobin: 14.5 g/dL (ref 12.0–15.0)
MCH: 28.4 pg (ref 26.0–34.0)
MCHC: 32.2 g/dL (ref 30.0–36.0)
MCV: 88.2 fL (ref 78.0–100.0)
PLATELETS: 244 10*3/uL (ref 150–400)
RBC: 5.1 MIL/uL (ref 3.87–5.11)
RDW: 17.8 % — ABNORMAL HIGH (ref 11.5–15.5)
WBC: 11 10*3/uL — AB (ref 4.0–10.5)

## 2013-10-16 LAB — URINE CULTURE
COLONY COUNT: NO GROWTH
Culture: NO GROWTH

## 2013-10-16 LAB — MRSA PCR SCREENING: MRSA BY PCR: POSITIVE — AB

## 2013-10-16 MED ORDER — MUPIROCIN 2 % EX OINT
1.0000 "application " | TOPICAL_OINTMENT | Freq: Two times a day (BID) | CUTANEOUS | Status: DC
  Administered 2013-10-16 – 2013-10-18 (×4): 1 via NASAL
  Filled 2013-10-16: qty 22

## 2013-10-16 MED ORDER — INSULIN ASPART 100 UNIT/ML ~~LOC~~ SOLN: 0.0000 [IU] | Freq: Every day | SUBCUTANEOUS | Status: DC

## 2013-10-16 MED ORDER — INSULIN ASPART 100 UNIT/ML ~~LOC~~ SOLN
0.0000 [IU] | Freq: Three times a day (TID) | SUBCUTANEOUS | Status: DC
  Administered 2013-10-16 – 2013-10-17 (×3): 3 [IU] via SUBCUTANEOUS
  Administered 2013-10-17: 2 [IU] via SUBCUTANEOUS
  Administered 2013-10-18 (×2): 3 [IU] via SUBCUTANEOUS

## 2013-10-16 MED ORDER — CHLORHEXIDINE GLUCONATE CLOTH 2 % EX PADS
6.0000 | MEDICATED_PAD | Freq: Every day | CUTANEOUS | Status: DC
  Administered 2013-10-17 – 2013-10-18 (×2): 6 via TOPICAL

## 2013-10-16 NOTE — Progress Notes (Signed)
PROGRESS NOTE  Katelyn Cline IOX:[id number] DOB: [birth year] DOA: 10/15/2013 PCP: [name], MD  Summary: 53 year old woman with complex past medical history presents the emergency department with 2-3 day history of worsening dysuria with bilateral flank pain and low-grade fever. Initial evaluation suggested UTI, clinical exam suggested pyelonephritis.  Assessment/Plan: 1. Acute pyelonephritis. Slowly improving. Afebrile. 2. New diagnosis of diabetes. Hemoglobin A1c 7.6 (6.27 September 2012). I discussed this diagnosis with patient. We discussed insulin versus oral medications. She is considering both options. 3. Polycythemia, stable. 4. Chronic hypoxic respiratory failure, stable on 3 L nasal cannula. 5. History of melanoma of the lung. Was due for outpatient CT scan. Discussed with oncology, request CT chest/ab/pelvis without contrast.  6. Chronic pain secondary to scoliosis. Obstructive sleep apnea. Patient is not using CPAP at home.  7. Depression, panic attacks, agoraphobia. Stable.   Continue empiric antibiotics, followup urine culture.  Change diet to carb modified.  Nutrition consultation for education  The patient to watch diabetic videos, receive education from staff  Pending studies:   Urine culture  Code Status: full code DVT prophylaxis: Lovenox Family Communication: discussed with son at bedside Disposition Plan: home when improved  Murray Hodgkins, MD  [facility]  Pager [phone] If 7PM-7AM, please contact night-coverage at [email or web address], password [email or web address] 10/16/2013, 3:26 PM  LOS: 1 day   Consultants:    Procedures:    Antibiotics:  Ceftriaxone 4/19 >>   HPI/Subjective: Suprapubic tenderness has resolved, minimal dysuria. However continues to have significant bilateral flank pain left greater than right. Ambulating without difficultly to the bathroom. Breathing at baseline.  Objective: Filed Vitals:   10/15/13 0845 10/15/13 1507 10/15/13  1947 10/16/13 0534  BP: 150/81 162/78 156/82 144/85  Pulse: 97 90 86 84  Temp: 98.5 F (36.9 C) 99.8 F (37.7 C) 98.3 F (36.8 C) 98.2 F (36.8 C)  TempSrc: Oral Oral Oral Oral  Resp: 22 21 22 22   Height: 5\' 8"  (1.727 m)     Weight: 121.9 kg (268 lb 11.9 oz)     SpO2: 95% 90% 90% 96%    Intake/Output Summary (Last 24 hours) at 10/16/13 1526 Last data filed at 10/15/13 1600  Gross per 24 hour  Intake    160 ml  Output      0 ml  Net    160 ml     Filed Weights   10/15/13 0407 10/15/13 0845  Weight: 99.791 kg (220 lb) 121.9 kg (268 lb 11.9 oz)    Exam:   Afebrile, vital signs are stable. Stable hypoxia.  Gen. Appears calm. Mildly uncomfortable. Nontoxic. Speech fluent and clear.  Cardiovascular. Regular rate and rhythm. No murmur, rub or gallop. No lower extremity edema.  Respiratory. Clear to auscultation bilaterally. No wheezes, rales or rhonchi. Normal respiratory effort.  Abdomen soft, nontender, nondistended. No suprapubic pain. Bilateral costovertebral angle pain left greater than right.  Psychiatric. Grossly normal and affect. Speech fluent and appropriate.  Data Reviewed:  Capillary blood sugars high 170-200s   WBC 13.5 >> 11.0  Scheduled Meds: . amLODipine  5 mg Oral Daily  . atorvastatin  40 mg Oral Daily  . cefTRIAXone (ROCEPHIN)  IV  1 g Intravenous Q24H  . DULoxetine  120 mg Oral Daily  . enoxaparin (LOVENOX) injection  40 mg Subcutaneous Daily  . insulin aspart  0-9 Units Subcutaneous TID WC  . potassium chloride SA  20 mEq Oral Daily  . sodium chloride  3 mL Intravenous Q12H  . triamterene-hydrochlorothiazide  1  capsule Oral Daily   Continuous Infusions:   Principal Problem:   Acute pyelonephritis Active Problems:   Chronic pain disorder   Depression   Polycythemia   Metastatic melanoma   Chronic hypoxemic respiratory failure   Pyelonephritis   Hyperglycemia   Time spent 20  minutes

## 2013-10-16 NOTE — Progress Notes (Signed)
Inpatient Diabetes Program Recommendations  AACE/ADA: New Consensus Statement on Inpatient Glycemic Control (2013)  Target Ranges:  Prepandial:   less than 140 mg/dL      Peak postprandial:   less than 180 mg/dL (1-2 hours)      Critically ill patients:  140 - 180 mg/dL  Results for Katelyn Cline, Katelyn Cline (MRN [id number]) as of 10/16/2013 09:19  Ref. Range 10/15/2013 05:34  Hemoglobin A1C Latest Range: <5.7 % 7.6 (H)   Results for Katelyn Cline, Katelyn Cline (MRN [id number]) as of 10/16/2013 09:19  Ref. Range 10/15/2013 11:29 10/15/2013 16:33 10/15/2013 20:33  Glucose-Capillary Latest Range: 70-99 mg/dL 288 (H) 224 (H) 219 (H)   Results for Katelyn Cline, YUHASZ (MRN [id number]) as of 10/16/2013 09:19  Ref. Range 10/16/2013 05:37  Glucose Latest Range: 70-99 mg/dL 199 (H)   Diabetes history: No Outpatient Diabetes medications: NA Current orders for Inpatient glycemic control: Novolog 0-9 units AC  Inpatient Diabetes Program Recommendations Correction (SSI): Please increase Novolog correction scale to moderate scale and add Novolog bedtime correction scale. HgbA1C: A1C 7.6% on 10/15/13 which meets ADA criteria for diagnosis of diabetes. Please inform patient of dx and have staff begin educating patient about diabetes. Diet: Please discontinue Regular diet and order Carb Modified Diabetic diet.  Thanks, Barnie Alderman, RN, MSN, CCRN Diabetes Coordinator Inpatient Diabetes Program [phone] (Team Pager) [phone] (AP office) [phone] [facility] office)

## 2013-10-16 NOTE — Progress Notes (Signed)
Patient stated she is too tired to have diabetic education at this time and hurting too bad. Stated she wants to wait until in the morning.

## 2013-10-16 NOTE — Progress Notes (Signed)
Dr. Sarajane Jews notified patient Katelyn Cline positive by PCR. PAtient on contact precautions

## 2013-10-16 NOTE — Progress Notes (Signed)
Patient up to chair at this time. Tolerating well.

## 2013-10-16 NOTE — Care Management Note (Addendum)
    Page 1 of 1   10/18/2013     2:19:39 PM CARE MANAGEMENT NOTE 10/18/2013  Patient:  Katelyn Cline, Katelyn Cline   Account Number:  [id number]  Date Initiated:  10/16/2013  Documentation initiated by:  Claretha Cooper  Subjective/Objective Assessment:   Pt lives at home and her son is her caregiver. Pt and son in room. Pt has [employer] for chronic O2. Son states he has her tank in his car for DC. Declines HH and DME needs     Action/Plan:   Anticipated DC Date:  10/19/2013   Anticipated DC Plan:  [place]  CM consult      Choice offered to / List presented to:             Status of service:  Completed, signed off Medicare Important Message given?  YES (If response is "NO", the following Medicare IM given date fields will be blank) Date Medicare IM given:  10/18/2013 Date Additional Medicare IM given:    Discharge Disposition:    Per UR Regulation:    If discussed at Long Length of Stay Meetings, dates discussed:    Comments:  10/16/13 Claretha Cooper RN BSN CM

## 2013-10-17 DIAGNOSIS — I10 Essential (primary) hypertension: Secondary | ICD-10-CM

## 2013-10-17 DIAGNOSIS — N12 Tubulo-interstitial nephritis, not specified as acute or chronic: Secondary | ICD-10-CM

## 2013-10-17 DIAGNOSIS — G894 Chronic pain syndrome: Secondary | ICD-10-CM

## 2013-10-17 DIAGNOSIS — E119 Type 2 diabetes mellitus without complications: Secondary | ICD-10-CM

## 2013-10-17 LAB — GLUCOSE, CAPILLARY
Glucose-Capillary: 172 mg/dL — ABNORMAL HIGH (ref 70–99)
Glucose-Capillary: 153 mg/dL — ABNORMAL HIGH (ref 70–99)
Glucose-Capillary: 140 mg/dL — ABNORMAL HIGH (ref 70–99)
Glucose-Capillary: 184 mg/dL — ABNORMAL HIGH (ref 70–99)

## 2013-10-17 MED ORDER — MAGNESIUM CITRATE PO SOLN
1.0000 | Freq: Once | ORAL | Status: AC
  Administered 2013-10-17: 1 via ORAL
  Filled 2013-10-17: qty 296

## 2013-10-17 MED ORDER — METFORMIN HCL 500 MG PO TABS
500.0000 mg | ORAL_TABLET | Freq: Two times a day (BID) | ORAL | Status: DC
  Administered 2013-10-17 – 2013-10-18 (×2): 500 mg via ORAL
  Filled 2013-10-17 (×2): qty 1

## 2013-10-17 MED ORDER — LIVING WELL WITH DIABETES BOOK
Freq: Once | Status: AC
  Administered 2013-10-17: 1
  Filled 2013-10-17: qty 1

## 2013-10-17 MED ORDER — POLYETHYLENE GLYCOL 3350 17 G PO PACK
17.0000 g | PACK | Freq: Every day | ORAL | Status: DC
  Administered 2013-10-17 – 2013-10-18 (×2): 17 g via ORAL
  Filled 2013-10-17 (×2): qty 1

## 2013-10-17 NOTE — Progress Notes (Signed)
Patient watched diabetic education videos.

## 2013-10-17 NOTE — Plan of Care (Signed)
Problem: Food- and Nutrition-Related Knowledge Deficit (NB-1.1) Goal: Nutrition education Formal process to instruct or train a patient/client in a skill or to impart knowledge to help patients/clients voluntarily manage or modify food choices and eating behavior to maintain or improve health. Outcome: Adequate for Discharge  RD consulted for nutrition education regarding diabetes.     Lab Results  Component Value Date    HGBA1C 7.6* 10/15/2013    RD provided "Carbohydrate Counting for People with Diabetes" handout from the Academy of Nutrition and Dietetics. Discussed different food groups and their effects on blood sugar, emphasizing carbohydrate-containing foods. Provided list of carbohydrates and recommended serving sizes of common foods.  Discussed importance of controlled and consistent carbohydrate intake throughout the day. Provided examples of ways to balance meals/snacks and encouraged intake of high-fiber, whole grain complex carbohydrates. Teach back method used.  Expect fair compliance.  Body mass index is 40.87 kg/(m^2). Pt meets criteria for obesity, class III based on current BMI.  Current diet order is carb modified/ heart healthy, patient is consuming approximately 90-100% of meals at this time. Labs and medications reviewed. No further nutrition interventions warranted at this time. RD contact information provided. If additional nutrition issues arise, please re-consult RD.  Ana Woodroof A. Jimmye Norman, RD, LDN Pager: [phone]

## 2013-10-17 NOTE — Progress Notes (Signed)
PROGRESS NOTE  Katelyn Cline WIO:[id number] DOB: 1961-03-21 DOA: 10/15/2013 PCP: [name], MD  Summary: 53 year old woman with complex past medical history presents the emergency department with 2-3 day history of worsening dysuria with bilateral flank pain and low-grade fever. Initial evaluation suggested UTI, clinical exam suggested pyelonephritis.  Assessment/Plan: 1. Acute pyelonephritis. Slowly improving. Afebrile. 2. New diagnosis of diabetes. Hemoglobin A1c 7.6 (6.27 September 2012). I discussed this diagnosis with patient. We have started her on metformin.  This can be further adjusted in the outpatient setting.. 3. Polycythemia, stable. 4. Chronic hypoxic respiratory failure, stable on 3 L nasal cannula. 5. History of melanoma of the lung. Was due for outpatient CT scan. Discussed with oncology, request CT chest/ab/pelvis without contrast.  6. Chronic pain secondary to scoliosis. Obstructive sleep apnea. Patient is not using CPAP at home.  7. Depression, panic attacks, agoraphobia. Stable.  Code Status: full code DVT prophylaxis: Lovenox Family Communication: discussed with son at bedside Disposition Plan: home when improved, probably tomorrow.  Kathie Dike, MD  [facility]  Pager [phone] If 7PM-7AM, please contact night-coverage at [email or web address], password [facility] 10/17/2013, 7:46 PM  LOS: 2 days   Consultants:    Procedures:    Antibiotics:  Ceftriaxone 4/19 >>   HPI/Subjective: Has not had a significant bowel movement in several days. Suprapubic tenderness is better. Dysuria has resolved. Continues to have some costovertebral angle tenderness, but improving  Objective: Filed Vitals:   10/16/13 1500 10/17/13 0016 10/17/13 0701 10/17/13 1413  BP: 135/78 132/82 131/67 122/78  Pulse: 66 71 72 66  Temp: 98 F (36.7 C) 97.8 F (36.6 C) 98.3 F (36.8 C) 97.9 F (36.6 C)  TempSrc: Oral Oral Oral   Resp: 22 20 20 20   Height:      Weight:       SpO2: 94% 96% 93% 96%    Intake/Output Summary (Last 24 hours) at 10/17/13 1946 Last data filed at 10/17/13 1700  Gross per 24 hour  Intake   1010 ml  Output      0 ml  Net   1010 ml     Filed Weights   10/15/13 0407 10/15/13 0845  Weight: 99.791 kg (220 lb) 121.9 kg (268 lb 11.9 oz)    Exam:   Afebrile, vital signs are stable. Stable hypoxia.  Gen. Appears calm. Mildly uncomfortable. Nontoxic. Speech fluent and clear.  Cardiovascular. Regular rate and rhythm. No murmur, rub or gallop. No lower extremity edema.  Respiratory. Clear to auscultation bilaterally. No wheezes, rales or rhonchi. Normal respiratory effort.  Abdomen soft, nontender, nondistended. No suprapubic pain. Bilateral costovertebral angle pain left greater than right.  Psychiatric. Grossly normal and affect. Speech fluent and appropriate.  Data Reviewed:  Capillary blood sugars high 170-200s   WBC 13.5 >> 11.0  Scheduled Meds: . amLODipine  5 mg Oral Daily  . atorvastatin  40 mg Oral Daily  . cefTRIAXone (ROCEPHIN)  IV  1 g Intravenous Q24H  . Chlorhexidine Gluconate Cloth  6 each Topical Q0600  . DULoxetine  120 mg Oral Daily  . enoxaparin (LOVENOX) injection  40 mg Subcutaneous Daily  . insulin aspart  0-15 Units Subcutaneous TID WC  . insulin aspart  0-5 Units Subcutaneous QHS  . metFORMIN  500 mg Oral BID WC  . mupirocin ointment  1 application Nasal BID  . polyethylene glycol  17 g Oral Daily  . potassium chloride SA  20 mEq Oral Daily  . sodium chloride  3 mL Intravenous Q12H  .  triamterene-hydrochlorothiazide  1 capsule Oral Daily   Continuous Infusions:   Principal Problem:   Acute pyelonephritis Active Problems:   Chronic pain disorder   Depression   Polycythemia   Metastatic melanoma   Chronic hypoxemic respiratory failure   Pyelonephritis   Hyperglycemia   DM type 2 (diabetes mellitus, type 2)   Time spent 20  minutes

## 2013-10-18 ENCOUNTER — Other Ambulatory Visit (HOSPITAL_COMMUNITY): Payer: Self-pay | Admitting: Oncology

## 2013-10-18 LAB — CBC
HCT: 43.1 % (ref 36.0–46.0)
Hemoglobin: 13.7 g/dL (ref 12.0–15.0)
MCH: 28.2 pg (ref 26.0–34.0)
MCHC: 31.8 g/dL (ref 30.0–36.0)
MCV: 88.7 fL (ref 78.0–100.0)
Platelets: 239 10*3/uL (ref 150–400)
RBC: 4.86 MIL/uL (ref 3.87–5.11)
RDW: 17.7 % — AB (ref 11.5–15.5)
WBC: 6.1 10*3/uL (ref 4.0–10.5)

## 2013-10-18 LAB — GLUCOSE, CAPILLARY
GLUCOSE-CAPILLARY: 169 mg/dL — AB (ref 70–99)
GLUCOSE-CAPILLARY: 168 mg/dL — AB (ref 70–99)
GLUCOSE-CAPILLARY: 129 mg/dL — AB (ref 70–99)

## 2013-10-18 MED ORDER — TRIAMTERENE-HCTZ 37.5-25 MG PO TABS: 1.0000 | ORAL_TABLET | Freq: Every day | ORAL | Status: DC

## 2013-10-18 MED ORDER — METFORMIN HCL 500 MG PO TABS: 500.0000 mg | ORAL_TABLET | Freq: Two times a day (BID) | ORAL | Status: DC

## 2013-10-18 NOTE — Discharge Summary (Signed)
Physician Discharge Summary  Katelyn Cline ZHG:[id number] DOB: December 26, 1960 DOA: 10/15/2013  PCP: [name], MD  Admit date: 10/15/2013 Discharge date: 10/18/2013  Time spent: 40 minutes  Recommendations for Outpatient Follow-up:  1. Follow up with primary care physician in 2 weeks  Discharge Diagnoses:  Principal Problem:   Acute pyelonephritis Active Problems:   Chronic pain disorder   Depression   Polycythemia   Metastatic melanoma   Chronic hypoxemic respiratory failure   Pyelonephritis   Hyperglycemia   DM type 2 (diabetes mellitus, type 2)   Discharge Condition: improved  Diet recommendation: low salt, low carb  Filed Weights   10/15/13 0407 10/15/13 0845  Weight: 99.791 kg (220 lb) 121.9 kg (268 lb 11.9 oz)    History of present illness:  53 year old woman with complex past medical history presents the emergency department with 2-3 day history of worsening dysuria with bilateral flank pain and low-grade fever. Initial evaluation suggested UTI, clinical exam suggested pyelonephritis.  Patient reports she had episode of vomiting 4/17. She then noticed severe dysuria, suprapubic pain and possible difficulty emptying that worsenedt 4/18. Associated with low-grade fever less than 101 and bilateral flank pain. No vomiting today. She was seen in urgent care 4/18 and placed on Keflex. She has been able to tolerate this medication without difficulty. However her symptoms did not improve. She has a history of allergy to penicillin "when I was a baby" reaction unknown.  She reports an episode of chest pain 4/18 with no recurrence. She thinks this is associated with a panic attack. She reports intermittent chest pain with panic attacks. She has chronic hypoxic respiratory failure from polycythemia and her respiratory status has been stable. She has had no new swelling of her legs except some left ankle edema from a recent ankle injury.  In the emergency department temperature  100.4. Vitals otherwise stable. Stable hypoxia on 3 L. Complete metabolic panel unremarkable. Troponin negative. WBC 13.5. Left shift. Remainder of CBC unremarkable. Random blood sugar 219. Urinalysis grossly positive. Chest x-ray no acute disease. EKG normal sinus rhythm, septal infarct, old compared to previous study 10/19/2012. Independent review. Treated with Rocephin in the emergency department without reaction.   Hospital Course:  This patient was admitted to the hospital with presumed pyelonephritis due to the presence of costovertebral tenderness, dysuria and suprapubic tenderness. Urinalysis indicates significant pyuria. She was started empirically on Rocephin. Interestingly, urine culture did not show any significant growth. She received a total of 4 days of Rocephin. Her leukocytosis has resolved. She reports improvement in her back pain. Dysuria and suprapubic tenderness have resolved. Patient is feeling improved. She was noted to have elevated blood sugars, and hemoglobin A1c was noted to be elevated at 7.6. She has been started on metformin. This can be further followed up by her primary care physician. The patient was ready for discharge. She does have a history of melanoma and is followed at the cancer center. Repeat staging CT scans had been recently scheduled. This was further discussed with oncology and it was felt that patient can pursue this as an outpatient.  Procedures:    Consultations:    Discharge Exam: Filed Vitals:   10/18/13 0444  BP: 118/61  Pulse: 67  Temp: 97.6 F (36.4 C)  Resp:     General: NAD Cardiovascular: S1, S2 RRR Respiratory: CTA B  Discharge Instructions You were cared for by a hospitalist during your hospital stay. If you have any questions about your discharge medications or the care  you received while you were in the hospital after you are discharged, you can call the unit and asked to speak with the hospitalist on call if the hospitalist that  took care of you is not available. Once you are discharged, your primary care physician will handle any further medical issues. Please note that NO REFILLS for any discharge medications will be authorized once you are discharged, as it is imperative that you return to your primary care physician (or establish a relationship with a primary care physician if you do not have one) for your aftercare needs so that they can reassess your need for medications and monitor your lab values.  Discharge Orders   Future Appointments Provider Department Dept Phone   11/01/2013 4:00 PM [facility] [address] IMAGING [phone]   Please pick up your oral contrast prep at your scheduled location at least 1 day prior to your appointment. Liquids only 4 hours prior to your exam. Any medications can be taken as usual. Please arrive 15 min prior to your scheduled exam time.   11/01/2013 4:45 PM Ap-Ct [address] IMAGING [phone]   Liquids only 4 hours prior to your exam. Any medications can be taken as usual. Please arrive 15 min prior to your scheduled exam time.   11/07/2013 11:20 AM [place] [phone]   11/07/2013 11:30 AM [facility] Provider [place] [phone]   Future Orders Complete By Expires   Call MD for:  persistant nausea and vomiting  As directed    Call MD for:  severe uncontrolled pain  As directed    Call MD for:  temperature >100.4  As directed    Diet - low sodium heart healthy  As directed    Diet Carb Modified  As directed    Increase activity slowly  As directed        Medication List         amLODipine 5 MG tablet  Commonly known as:  NORVASC  Take 1 tablet (5 mg total) by mouth daily.     aspirin-acetaminophen-caffeine 482-500-37 MG per tablet  Commonly known as:  EXCEDRIN MIGRAINE  Take 1 tablet by mouth every 6 (six) hours as needed for pain.     atorvastatin 40 MG tablet  Commonly known as:  LIPITOR  Take 1 tablet (40  mg total) by mouth daily.     DULoxetine 60 MG capsule  Commonly known as:  CYMBALTA  Take 2 capsules (120 mg total) by mouth daily.     fish oil-omega-3 fatty acids 1000 MG capsule  Take 2 g by mouth 2 (two) times daily.     gabapentin 100 MG capsule  Commonly known as:  NEURONTIN  Take 100-300 mg by mouth 3 (three) times daily as needed (Nerve Pain).     ibuprofen 800 MG tablet  Commonly known as:  ADVIL,MOTRIN  Take 800 mg by mouth every 8 (eight) hours as needed for pain.     metFORMIN 500 MG tablet  Commonly known as:  GLUCOPHAGE  Take 1 tablet (500 mg total) by mouth 2 (two) times daily with a meal.     oxyCODONE-acetaminophen 7.5-325 MG per tablet  Commonly known as:  PERCOCET  Take 0.5-1 tablets by mouth every 4 (four) hours as needed for pain.     potassium chloride SA 20 MEQ tablet  Commonly known as:  K-DUR,KLOR-CON  Take 1 tablet (20 mEq total) by mouth daily.  triamterene-hydrochlorothiazide 37.5-25 MG per capsule  Commonly known as:  DYAZIDE  Take 1 each (1 capsule total) by mouth every morning.     Vitamin D 2000 UNITS Caps  Take 1 capsule by mouth daily.       Allergies  Allergen Reactions  . Contrast Media [Iodinated Diagnostic Agents] Anaphylaxis  . Flagyl [Metronidazole] Other (See Comments)    Generalized pain.  . Latex Other (See Comments)    Was told to be careful because of Dye allergy  . Penicillins Other (See Comments)    Patient was an infant, no idea of reaction. Tolerates Keflex.  . Tetracyclines & Related Other (See Comments)    GI side effects       Follow-up Information   Follow up with [name], MD. Schedule an appointment as soon as possible for a visit in 2 weeks.   Specialty:  Family Medicine   Contact information:   [address] [place] [address] [phone]        The results of significant diagnostics from this hospitalization (including imaging, microbiology, ancillary and laboratory) are  listed below for reference.    Significant Diagnostic Studies: Dg Chest 2 View  10/15/2013   CLINICAL DATA:  Chest pain, back pain, fever. History of metastatic melanoma.  EXAM: CHEST  2 VIEW  COMPARISON:  CT ABD/PELV WO CM dated 07/07/2013; DG CHEST 2 VIEW dated 11/28/2012  FINDINGS: Normal heart size and pulmonary vascularity. Linear scarring in the right upper lung. Mild hyperinflation. No focal airspace disease or consolidation in the lungs. No focal parenchymal nodules or masses identified. Hilar structures are symmetrical. No change since previous study.  IMPRESSION: No active cardiopulmonary disease.   Electronically Signed   By: Lucienne Capers M.D.   On: 10/15/2013 05:22    Microbiology: Recent Results (from the past 240 hour(s))  URINE CULTURE     Status: None   Collection Time    10/15/13  6:01 AM      Result Value Ref Range Status   Specimen Description URINE, CLEAN CATCH   Final   Special Requests NONE   Final   Culture  Setup Time     Final   Value: 10/15/2013 20:14     Performed at [name]     Final   Value: NO GROWTH     Performed at [facility]   Culture     Final   Value: NO GROWTH     Performed at [facility]   Report Status 10/16/2013 FINAL   Final  MRSA PCR SCREENING     Status: Abnormal   Collection Time    10/16/13 11:19 AM      Result Value Ref Range Status   MRSA by PCR POSITIVE (*) NEGATIVE Final   Comment:            The GeneXpert MRSA Assay (FDA     approved for NASAL specimens     only), is one component of a     comprehensive MRSA colonization     surveillance program. It is not     intended to diagnose MRSA     infection nor to guide or     monitor treatment for     MRSA infections.     RESULT CALLED TO, READ BACK BY AND VERIFIED WITH:     COFFEY,J. AT 1810 ON 10/16/2013 BY BAUGHAM,M.     Labs: Basic Metabolic Panel:  Recent Labs Lab 10/15/13 0537 10/16/13 0537  NA 141 137  K 3.7 3.9  CL 99  94*  CO2 30 30  GLUCOSE 219* 199*  BUN 8 10  CREATININE 0.59 0.58  CALCIUM 9.3 9.2   Liver Function Tests:  Recent Labs Lab 10/15/13 0537  AST 15  ALT 15  ALKPHOS 151*  BILITOT 0.3  PROT 7.5  ALBUMIN 3.3*   No results found for this basename: LIPASE, AMYLASE,  in the last 168 hours No results found for this basename: AMMONIA,  in the last 168 hours CBC:  Recent Labs Lab 10/15/13 0537 10/16/13 0537 10/18/13 0552  WBC 13.5* 11.0* 6.1  NEUTROABS 11.3*  --   --   HGB 14.6 14.5 13.7  HCT 45.1 45.0 43.1  MCV 87.7 88.2 88.7  PLT 259 244 239   Cardiac Enzymes:  Recent Labs Lab 10/15/13 0537  TROPONINI <0.30   BNP: BNP (last 3 results) No results found for this basename: PROBNP,  in the last 8760 hours CBG:  Recent Labs Lab 10/17/13 1632 10/17/13 2058 10/18/13 0716 10/18/13 1128 10/18/13 1614  GLUCAP 140* 184* 169* 168* 129*       Signed:  Aspasia Rude  [facility] 10/18/2013, 7:34 PM

## 2013-10-18 NOTE — Progress Notes (Signed)
Spoke with patient about new diabetes diagnosis. Discussed A1C results (7.6% on 10/15/13) and explained what an A1C is, basic pathophysiology of DM Type 2, basic home care, importance of checking CBGs and maintaining good CBG control to prevent long-term and short-term complications. Reviewed signs and symptoms of hyperglycemia and hypoglycemia along with treatment for both. RNs to provide ongoing basic DM education at bedside with this patient and engage patient to actively check blood glucose. Reviewed Living Well with Diabetes booklet with the patient and provided additional handout material on diabetes and Carb Meal planning.  Patient will need a glucometer at time of discharge.  Patient verbalized understanding of information discussed and reports that she does not have any further questions at this time related to diabetes.    Thanks, Barnie Alderman, RN, MSN, CCRN Diabetes Coordinator Inpatient Diabetes Program [phone] (Team Pager) [phone] ([place] office) [phone] [facility] office)

## 2013-10-19 NOTE — Progress Notes (Signed)
Discharge summary sent to payer through [facility]  

## 2013-10-25 ENCOUNTER — Other Ambulatory Visit: Payer: Self-pay | Admitting: Family Medicine

## 2013-10-25 ENCOUNTER — Encounter: Payer: Self-pay | Admitting: Family Medicine

## 2013-10-25 ENCOUNTER — Other Ambulatory Visit: Payer: Self-pay | Admitting: *Deleted

## 2013-10-25 ENCOUNTER — Ambulatory Visit (INDEPENDENT_AMBULATORY_CARE_PROVIDER_SITE_OTHER): Payer: Medicare HMO | Admitting: Family Medicine

## 2013-10-25 VITALS — BP 192/106 | HR 88 | Temp 98.2°F | Resp 20 | Ht 68.0 in | Wt 270.0 lb

## 2013-10-25 DIAGNOSIS — N12 Tubulo-interstitial nephritis, not specified as acute or chronic: Secondary | ICD-10-CM

## 2013-10-25 DIAGNOSIS — G609 Hereditary and idiopathic neuropathy, unspecified: Secondary | ICD-10-CM

## 2013-10-25 DIAGNOSIS — E119 Type 2 diabetes mellitus without complications: Secondary | ICD-10-CM

## 2013-10-25 DIAGNOSIS — Z23 Encounter for immunization: Secondary | ICD-10-CM

## 2013-10-25 DIAGNOSIS — M5137 Other intervertebral disc degeneration, lumbosacral region: Secondary | ICD-10-CM

## 2013-10-25 DIAGNOSIS — I10 Essential (primary) hypertension: Secondary | ICD-10-CM

## 2013-10-25 DIAGNOSIS — M5136 Other intervertebral disc degeneration, lumbar region: Secondary | ICD-10-CM

## 2013-10-25 DIAGNOSIS — G629 Polyneuropathy, unspecified: Secondary | ICD-10-CM

## 2013-10-25 DIAGNOSIS — R7303 Prediabetes: Secondary | ICD-10-CM

## 2013-10-25 MED ORDER — OXYCODONE-ACETAMINOPHEN 7.5-325 MG PO TABS: 1.0000 | ORAL_TABLET | Freq: Three times a day (TID) | ORAL | Status: DC | PRN

## 2013-10-25 NOTE — Assessment & Plan Note (Signed)
(  A pain contract for Percocet 5 325 she will get 90 a month and she signed a pain contract today

## 2013-10-25 NOTE — Assessment & Plan Note (Signed)
Blood pressure severely elevated today. She will pick up her medications today she will return in 4 weeks. I will get [facility] to assist with her at home as well as with her medications

## 2013-10-25 NOTE — Telephone Encounter (Signed)
Refill appropriate and filled per protocol. 

## 2013-10-25 NOTE — Assessment & Plan Note (Signed)
Unchanged she has Cymbalta. Gabapentin however she's not been taking due to finances

## 2013-10-25 NOTE — Assessment & Plan Note (Signed)
Recheck her urine culture before giving any further antibiotics no systemic findings based on exam

## 2013-10-25 NOTE — Patient Instructions (Signed)
Restart blood pressure medication Pick up meter, check your sugar fasting, bring to next visit Pain contract signed We will call for urine results [facility] referral  F/U 4 weeks

## 2013-10-25 NOTE — Progress Notes (Signed)
Patient ID: Katelyn Cline, female   DOB: [birth year], 53 y.o.   MRN: [id number]   Subjective:    Patient ID: Katelyn Cline, female    DOB: 1960/11/29, 53 y.o.   MRN: [id number]  Patient presents for Hospital F/U, back pain and medication refills  patient here for hospital followup. She was admitted secondary to pyelonephritis. She is status post IV antibiotics as well as oral antibiotics. She states she still gets some pain in her lower back which goes from side to side she denies any dysuria. She is concerned that the infection has not cleared. Not had any fever nausea vomiting abdominal pain Has had worse back pain over the past couple weeks. She's not sure if this is due to deconditioning versus her chronic back pain which he has degenerative disc disease. She was on Percocet recently secondary to a broken left fibula which she's now been cleared from by Dr. Luna Glasgow.  She's not been taking her blood pressure medications because she does not have the money to get them as well as her cholesterol medication. She was diagnosed with diabetes mellitus while she was in the hospital was started on metformin which she does have. She states that her son has money to get her medications today.    Review Of Systems:  GEN- denies fatigue, fever, weight loss,weakness, recent illness HEENT- denies eye drainage, change in vision, nasal discharge, CVS- denies chest pain, palpitations RESP- denies SOB, cough, wheeze ABD- denies N/V, change in stools, abd pain GU- denies dysuria, hematuria, dribbling, incontinence MSK- +joint pain, +muscle aches, injury Neuro- denies headache, dizziness, syncope, seizure activity       Objective:    BP 192/106  Pulse 88  Temp(Src) 98.2 F (36.8 C) (Oral)  Resp 20  Ht 5\' 8"  (1.727 m)  Wt 270 lb (122.471 kg)  BMI 41.06 kg/m2 GEN- NAD, alert and oriented x3 HEENT- PERRL, EOMI, non injected sclera, pink conjunctiva, MMM, oropharynx clear Neck- Supple, no  LAD CVS- RRR, no murmur RESP-CTAB ABD-NABS,soft,NT,ND, No CVA tendernesss EXT- trace pedal edema Pulses- Radial, DP- 2+ MSK- Mild TTP lumbar spine, no paraspinal spasm, decreased ROM LE,        Assessment & Plan:      Problem List Items Addressed This Visit   Pyelonephritis - Primary   Relevant Orders      Urine culture    Other Visit Diagnoses   Need for prophylactic vaccination against Streptococcus pneumoniae (pneumococcus)        Relevant Orders       Pneumococcal polysaccharide vaccine 23-valent greater than or equal to 2yo subcutaneous/IM (Completed)       Note: This dictation was prepared with Dragon dictation along with smaller phrase technology. Any transcriptional errors that result from this process are unintentional.

## 2013-10-25 NOTE — Assessment & Plan Note (Signed)
She is a prescription for new glucose meter given by the hospital advised her to go ahead and get this filled and to take her blood sugar at least once a day fasting. We were to get her compliant with the rest of her medications.

## 2013-10-26 LAB — URINE CULTURE

## 2013-10-27 ENCOUNTER — Telehealth: Payer: Self-pay | Admitting: *Deleted

## 2013-10-27 NOTE — Telephone Encounter (Signed)
Call placed to patient and patient made aware.  

## 2013-10-27 NOTE — Telephone Encounter (Signed)
Received call from patient.   States that she is monitoring FSBS as ordered and is taking Metformin as ordered.   Reports that fasting FSBS noted around 200 since she has been testing in the mornings, and it generally does come down a bit after taking medication.   Advised that she has only been testing for 2 days and it may take a few more days for the medication to build up in system to begin to effectively control glucose levels.   MD to be made aware.

## 2013-10-27 NOTE — Telephone Encounter (Signed)
Noted contiue at current dose

## 2013-11-01 ENCOUNTER — Ambulatory Visit (HOSPITAL_COMMUNITY)
Admit: 2013-11-01 | Discharge: 2013-11-01 | Disposition: A | Discharge status: Home / Self Care | Payer: Medicare HMO | Source: Ambulatory Visit | Attending: Oncology | Admitting: Oncology

## 2013-11-01 ENCOUNTER — Ambulatory Visit (HOSPITAL_COMMUNITY): Admit: 2013-11-01 | Payer: Medicare HMO

## 2013-11-01 DIAGNOSIS — K5730 Diverticulosis of large intestine without perforation or abscess without bleeding: Secondary | ICD-10-CM | POA: Insufficient documentation

## 2013-11-01 DIAGNOSIS — C439 Malignant melanoma of skin, unspecified: Secondary | ICD-10-CM

## 2013-11-01 DIAGNOSIS — C799 Secondary malignant neoplasm of unspecified site: Secondary | ICD-10-CM

## 2013-11-01 DIAGNOSIS — R911 Solitary pulmonary nodule: Secondary | ICD-10-CM | POA: Insufficient documentation

## 2013-11-07 ENCOUNTER — Encounter (HOSPITAL_COMMUNITY): Payer: Medicare HMO | Attending: Oncology

## 2013-11-07 ENCOUNTER — Encounter (HOSPITAL_BASED_OUTPATIENT_CLINIC_OR_DEPARTMENT_OTHER): Payer: Medicare HMO

## 2013-11-07 ENCOUNTER — Encounter (HOSPITAL_COMMUNITY): Payer: Self-pay

## 2013-11-07 VITALS — BP 127/66 | HR 78 | Temp 98.1°F | Resp 20 | Wt 260.2 lb

## 2013-11-07 DIAGNOSIS — D45 Polycythemia vera: Secondary | ICD-10-CM

## 2013-11-07 DIAGNOSIS — E119 Type 2 diabetes mellitus without complications: Secondary | ICD-10-CM

## 2013-11-07 DIAGNOSIS — C439 Malignant melanoma of skin, unspecified: Secondary | ICD-10-CM | POA: Insufficient documentation

## 2013-11-07 DIAGNOSIS — D751 Secondary polycythemia: Secondary | ICD-10-CM | POA: Insufficient documentation

## 2013-11-07 DIAGNOSIS — Z22322 Carrier or suspected carrier of Methicillin resistant Staphylococcus aureus: Secondary | ICD-10-CM

## 2013-11-07 DIAGNOSIS — C799 Secondary malignant neoplasm of unspecified site: Secondary | ICD-10-CM

## 2013-11-07 LAB — CBC
HCT: 45.9 % (ref 36.0–46.0)
Hemoglobin: 15.6 g/dL — ABNORMAL HIGH (ref 12.0–15.0)
MCH: 29.4 pg (ref 26.0–34.0)
MCHC: 34 g/dL (ref 30.0–36.0)
MCV: 86.4 fL (ref 78.0–100.0)
Platelets: 339 10*3/uL (ref 150–400)
RBC: 5.31 MIL/uL — ABNORMAL HIGH (ref 3.87–5.11)
RDW: 17 % — ABNORMAL HIGH (ref 11.5–15.5)
WBC: 10.6 10*3/uL — ABNORMAL HIGH (ref 4.0–10.5)

## 2013-11-07 NOTE — Patient Instructions (Signed)
[place] Discharge Instructions  RECOMMENDATIONS MADE BY THE CONSULTANT AND ANY TEST RESULTS WILL BE SENT TO YOUR REFERRING PHYSICIAN.  EXAM FINDINGS BY THE PHYSICIAN TODAY AND SIGNS OR SYMPTOMS TO REPORT TO CLINIC OR PRIMARY PHYSICIAN: Exam and findings as discussed by Dr. Barnet Glasgow.  Scans are stable.  Will recheck scans and blood work in 3 months.  Report any new skin lesions or other problems.  MEDICATIONS PRESCRIBED:  none  INSTRUCTIONS/FOLLOW-UP: Follow-up in 3 months.  Thank you for choosing [facility] to provide your oncology and hematology care.  To afford each patient quality time with our providers, please arrive at least 15 minutes before your scheduled appointment time.  With your help, our goal is to use those 15 minutes to complete the necessary work-up to ensure our physicians have the information they need to help with your evaluation and healthcare recommendations.    Effective January 1st, 2014, we ask that you re-schedule your appointment with our physicians should you arrive 10 or more minutes late for your appointment.  We strive to give you quality time with our providers, and arriving late affects you and other patients whose appointments are after yours.    Again, thank you for choosing [facility].  Our hope is that these requests will decrease the amount of time that you wait before being seen by our physicians.       _____________________________________________________________  Should you have questions after your visit to [facility], please contact our office at [phone] between the hours of 8:30 a.m. and 5:00 p.m.  Voicemails left after 4:30 p.m. will not be returned until the following business day.  For prescription refill requests, have your pharmacy contact our office with your prescription refill request.

## 2013-11-07 NOTE — Progress Notes (Signed)
Katelyn Cline presented for labwork. Labs per MD order drawn via Peripheral Line 23 gauge needle inserted in right forearm  Good blood return present. Procedure without incident.  Needle removed intact. Patient tolerated procedure well.

## 2013-11-07 NOTE — Progress Notes (Signed)
[facility]  OFFICE PROGRESS NOTE  [name], MD [address] [place] [address]  DIAGNOSIS: MRSA (methicillin resistant staph aureus) culture positive  Metastatic melanoma - Plan: CT Abdomen Pelvis Wo Contrast, CT Chest Wo Contrast, CBC with Differential, Comprehensive metabolic panel  Polycythemia, JAK-2 positive  Diabetes - Plan: Hemoglobin A1c  Chief Complaint  Patient presents with  . Metastatic melanoma to lung  . Polycythemia, JAK-2 positive    CURRENT THERAPY: Watchful expectation and surveillance.  INTERVAL HISTORY: Katelyn Cline 53 y.o. female returns for followup of metastatic melanoma to lung, status post resection in this setting of polycythemia vera JAK-2 positive, undergoing periodic phlebotomy.  She was hospitalized in April with pyelonephritis and was treated with 4 days of intravenous antibiotics with no followup antibiotics as an outpatient. She was also diagnosed with diabetes and is currently taking metformin. She has lost 5 pounds and is monitoring her blood sugar. She denies any cough, wheezing, sore throat, but does have chronic back pain for which he uses a cane and has a dog companion. She denies a diarrhea, constipation, and still has some minimal right flank discomfort without fever or chills. She denies a sore throat, lower extremity swelling or redness, PND, orthopnea, palpitations, headache, or seizures.  MEDICAL HISTORY: Past Medical History  Diagnosis Date  . Narcolepsy   . HTN (hypertension)   . Fibromyalgia   . Scoliosis   . Migraines   . Major depression   . Panic attacks   . Agoraphobia   . Chronic pain   . Miscarriage     x 4  . Peripheral neuropathy   . Hyperlipidemia   . Elevated hemoglobin 2014  . Vertigo   . Polycythemia secondary to smoking   . Dysrhythmia     Hx: of palpitations "a long time ago"  . Shortness of breath     Hx: of with activity  . Sleep apnea     . Basal cell carcinoma   . Polycythemia vera(238.4)   . Metastatic melanoma 10/18/2012    12 mm posterior right upper lobe pulmonary nodule, max SUV 3.0    . Chronic respiratory failure with hypoxia     3L [place]  . Peripheral neuropathy     in feet  . DM type 2 (diabetes mellitus, type 2) 10/16/2013  . History of UTI     INTERIM HISTORY: has DDD (degenerative disc disease), lumbar; DDD (degenerative disc disease), cervical; Chronic pain disorder; Depression; Fibromyalgia; Hyperlipidemia; Narcolepsy; Peripheral neuropathy; Tobacco use disorder; Obesity; Essential hypertension, benign; Non-healing skin lesion of nose; Polycythemia; Polycythemia secondary to smoking; Metastatic melanoma; Type II or unspecified type diabetes mellitus without mention of complication, not stated as uncontrolled; Chronic hypoxemic respiratory failure; Basal cell carcinoma of skin; Posttraumatic stress disorder; Pyelonephritis; and MRSA (methicillin resistant staph aureus) culture positive on her problem list.   Dr. Harriet Masson at [facility]. The patient qualified for a clinical trial, but Tylia could not afford her medical care associated with this trial. As a result, the patient has opted out of the trial. Dr. Tressie Stalker discussed this information with Dr. Harriet Masson and she recommends close surveillance of the patient as a result, and that is what we will do. I personally spoke with Dr. Harriet Masson today regarding the patient to verify that this is the plan.  According to Dr. Harriet Masson , 5 to 49% of patients are cured with surgery alone for minimal  disease. As a result, it would be worthwhile performing surveillance on this patient is at a subjecting her to therapy that may be unnecessary. She recommended CTs of chest, abdomen, and pelvis every 3 months for surveillance as this carries less radiation risk than a PET scan. Dr. Harriet Masson suspects that the patient will recur within the next year and at that point in time she  would be a candidate for Yervoy (Ipilimumab).  CT scan done on 04/05/2013 without contrast showed no evidence of recurrence. CT scan done on 11/01/2013 without contrast showed no evidence of recurrence but with a left lower lobe nodule.  ALLERGIES:  is allergic to contrast media; erythromycin; flagyl; latex; penicillins; and tetracyclines & related.  MEDICATIONS: has a current medication list which includes the following prescription(s): amlodipine, aspirin-acetaminophen-caffeine, atorvastatin, vitamin d, duloxetine, fish oil-omega-3 fatty acids, gabapentin, ibuprofen, metformin, oxycodone-acetaminophen, potassium chloride sa, and triamterene-hydrochlorothiazide.  SURGICAL HISTORY:  Past Surgical History  Procedure Laterality Date  . Vaginal hysterectomy    . Cystectomy      abdominal wall   . Basal cell carcinoma excision      flap surgery on face  . Colonoscopy w/ biopsies and polypectomy      Hx: of  . Dilation and curettage of uterus      Hx: of   . Video assisted thoracoscopy (vats)/wedge resection Right 10/21/2012    Procedure: VIDEO ASSISTED THORACOSCOPY (VATS)/WEDGE RESECTION;  Surgeon: [name], MD;  Location: [facility];  Service: Thoracic;  Laterality: Right;  . Video bronchoscopy N/A 10/21/2012    Procedure: VIDEO BRONCHOSCOPY;  Surgeon: [name], MD;  Location: [facility] OR;  Service: Thoracic;  Laterality: N/A;  . Breast lumpectomy      bilateral    FAMILY HISTORY: family history includes ADD / ADHD in her son; Alcohol abuse in her maternal grandfather; Anxiety disorder in her mother; Arthritis in her mother; Dementia in her father and mother; Depression in her mother; Heart disease in her brother and father; Hyperlipidemia in her father and mother; Hypertension in her father; Stroke in her father. There is no history of Bipolar disorder, Drug abuse, OCD, Paranoid behavior, Schizophrenia, Seizures, Sexual abuse, or Physical abuse.  SOCIAL HISTORY:  reports that she quit  smoking about 13 months ago. Her smoking use included Cigarettes. She has a 10 pack-year smoking history. She has never used smokeless tobacco. She reports that she drinks alcohol. She reports that she does not use illicit drugs.  REVIEW OF SYSTEMS:  Other than that discussed above is noncontributory.  PHYSICAL EXAMINATION: ECOG PERFORMANCE STATUS: 2 - Symptomatic, <50% confined to bed  Blood pressure 127/66, pulse 78, temperature 98.1 F (36.7 C), temperature source Oral, resp. rate 20, weight 260 lb 3.2 oz (118.026 kg).  GENERAL:alert, no distress and comfortable. Morbidly obese. SKIN: skin color, texture, turgor are normal, no rashes or significant lesions EYES: PERLA; Conjunctiva are pink and non-injected, sclera clear SINUSES: No redness or tenderness over maxillary or ethmoid sinuses OROPHARYNX:no exudate, no erythema on lips, buccal mucosa, or tongue. NECK: supple, thyroid normal size, non-tender, without nodularity. No masses CHEST: Normal AP diameter with no breast masses. LYMPH:  no palpable lymphadenopathy in the cervical, axillary or inguinal LUNGS: clear to auscultation and percussion with normal breathing effort HEART: regular rate & rhythm and no murmurs. ABDOMEN:abdomen soft, non-tender and normal bowel sounds MUSCULOSKELETAL:no cyanosis of digits and no clubbing. Range of motion normal.  NEURO: alert & oriented x 3 with fluent speech, no focal motor/sensory deficits  LABORATORY DATA: Infusion on 11/07/2013  Component Date Value Ref Range Status  . WBC 11/07/2013 10.6* 4.0 - 10.5 K/uL Final  . RBC 11/07/2013 5.31* 3.87 - 5.11 MIL/uL Final  . Hemoglobin 11/07/2013 15.6* 12.0 - 15.0 g/dL Final  . HCT 11/07/2013 45.9  36.0 - 46.0 % Final  . MCV 11/07/2013 86.4  78.0 - 100.0 fL Final  . MCH 11/07/2013 29.4  26.0 - 34.0 pg Final  . MCHC 11/07/2013 34.0  30.0 - 36.0 g/dL Final  . RDW 11/07/2013 17.0* 11.5 - 15.5 % Final  . Platelets 11/07/2013 339  150 - 400 K/uL Final    Office Visit on 10/25/2013  Component Date Value Ref Range Status  . Colony Count 10/25/2013 3,000 COLONIES/ML   Final  . Organism ID, Bacteria 10/25/2013 Insignificant Growth   Final  Admission on 10/15/2013, Discharged on 10/18/2013  Component Date Value Ref Range Status  . WBC 10/15/2013 13.5* 4.0 - 10.5 K/uL Final  . RBC 10/15/2013 5.14* 3.87 - 5.11 MIL/uL Final  . Hemoglobin 10/15/2013 14.6  12.0 - 15.0 g/dL Final  . HCT 10/15/2013 45.1  36.0 - 46.0 % Final  . MCV 10/15/2013 87.7  78.0 - 100.0 fL Final  . MCH 10/15/2013 28.4  26.0 - 34.0 pg Final  . MCHC 10/15/2013 32.4  30.0 - 36.0 g/dL Final  . RDW 10/15/2013 17.7* 11.5 - 15.5 % Final  . Platelets 10/15/2013 259  150 - 400 K/uL Final  . Neutrophils Relative % 10/15/2013 84* 43 - 77 % Final  . Neutro Abs 10/15/2013 11.3* 1.7 - 7.7 K/uL Final  . Lymphocytes Relative 10/15/2013 8* 12 - 46 % Final  . Lymphs Abs 10/15/2013 1.1  0.7 - 4.0 K/uL Final  . Monocytes Relative 10/15/2013 7  3 - 12 % Final  . Monocytes Absolute 10/15/2013 0.9  0.1 - 1.0 K/uL Final  . Eosinophils Relative 10/15/2013 1  0 - 5 % Final  . Eosinophils Absolute 10/15/2013 0.1  0.0 - 0.7 K/uL Final  . Basophils Relative 10/15/2013 0  0 - 1 % Final  . Basophils Absolute 10/15/2013 0.0  0.0 - 0.1 K/uL Final  . Sodium 10/15/2013 141  137 - 147 mEq/L Final  . Potassium 10/15/2013 3.7  3.7 - 5.3 mEq/L Final  . Chloride 10/15/2013 99  96 - 112 mEq/L Final  . CO2 10/15/2013 30  19 - 32 mEq/L Final  . Glucose, Bld 10/15/2013 219* 70 - 99 mg/dL Final  . BUN 10/15/2013 8  6 - 23 mg/dL Final  . Creatinine, Ser 10/15/2013 0.59  0.50 - 1.10 mg/dL Final  . Calcium 10/15/2013 9.3  8.4 - 10.5 mg/dL Final  . Total Protein 10/15/2013 7.5  6.0 - 8.3 g/dL Final  . Albumin 10/15/2013 3.3* 3.5 - 5.2 g/dL Final  . AST 10/15/2013 15  0 - 37 U/L Final  . ALT 10/15/2013 15  0 - 35 U/L Final  . Alkaline Phosphatase 10/15/2013 151* 39 - 117 U/L Final  . Total Bilirubin 10/15/2013 0.3   0.3 - 1.2 mg/dL Final  . GFR calc non Af Amer 10/15/2013 >90  >90 mL/min Final  . GFR calc Af Amer 10/15/2013 >90  >90 mL/min Final   Comment: (NOTE)                          The eGFR has been calculated using the CKD EPI equation.  This calculation has not been validated in all clinical situations.                          eGFR's persistently <90 mL/min signify possible Chronic Kidney                          Disease.  . Color, Urine 10/15/2013 YELLOW  YELLOW Final  . APPearance 10/15/2013 HAZY* CLEAR Final  . Specific Gravity, Urine 10/15/2013 1.020  1.005 - 1.030 Final  . pH 10/15/2013 6.0  5.0 - 8.0 Final  . Glucose, UA 10/15/2013 NEGATIVE  NEGATIVE mg/dL Final  . Hgb urine dipstick 10/15/2013 SMALL* NEGATIVE Final  . Bilirubin Urine 10/15/2013 NEGATIVE  NEGATIVE Final  . Ketones, ur 10/15/2013 NEGATIVE  NEGATIVE mg/dL Final  . Protein, ur 10/15/2013 30* NEGATIVE mg/dL Final  . Urobilinogen, UA 10/15/2013 0.2  0.0 - 1.0 mg/dL Final  . Nitrite 10/15/2013 NEGATIVE  NEGATIVE Final  . Leukocytes, UA 10/15/2013 NEGATIVE  NEGATIVE Final  . Troponin I 10/15/2013 <0.30  <0.30 ng/mL Final   Comment:                                 Due to the release kinetics of cTnI,                          a negative result within the first hours                          of the onset of symptoms does not rule out                          myocardial infarction with certainty.                          If myocardial infarction is still suspected,                          repeat the test at appropriate intervals.  . Squamous Epithelial / LPF 10/15/2013 FEW* RARE Final  . WBC, UA 10/15/2013 21-50  <3 WBC/hpf Final  . RBC / HPF 10/15/2013 3-6  <3 RBC/hpf Final  . Bacteria, UA 10/15/2013 FEW* RARE Final  . Specimen Description 10/15/2013 URINE, CLEAN CATCH   Final  . Special Requests 10/15/2013 NONE   Final  . Culture  Setup Time 10/15/2013    Final                   Value:10/15/2013  20:14                         Performed at [facility]  . Colony Count 10/15/2013    Final                   Value:NO GROWTH                         Performed at [facility]  . Culture 10/15/2013    Final                   Value:NO GROWTH  Performed at [facility]  . Report Status 10/15/2013 10/16/2013 FINAL   Final  . Hemoglobin A1C 10/15/2013 7.6* <5.7 % Final   Comment: (NOTE)                                                                                                                         According to the ADA Clinical Practice Recommendations for 2011, when                          HbA1c is used as a screening test:                           >=6.5%   Diagnostic of Diabetes Mellitus                                    (if abnormal result is confirmed)                          5.7-6.4%   Increased risk of developing Diabetes Mellitus                          References:Diagnosis and Classification of Diabetes Mellitus,Diabetes                          HDQQ,2297,98(XQJJH 1):S62-S69 and Standards of Medical Care in                                  Diabetes - 2011,Diabetes ERDE,0814,48 (Suppl 1):S11-S61.  . Mean Plasma Glucose 10/15/2013 171* <117 mg/dL Final   Performed at [facility]  . Glucose-Capillary 10/15/2013 288* 70 - 99 mg/dL Final  . Glucose-Capillary 10/15/2013 224* 70 - 99 mg/dL Final  . Sodium 10/16/2013 137  137 - 147 mEq/L Final  . Potassium 10/16/2013 3.9  3.7 - 5.3 mEq/L Final  . Chloride 10/16/2013 94* 96 - 112 mEq/L Final  . CO2 10/16/2013 30  19 - 32 mEq/L Final  . Glucose, Bld 10/16/2013 199* 70 - 99 mg/dL Final  . BUN 10/16/2013 10  6 - 23 mg/dL Final  . Creatinine, Ser 10/16/2013 0.58  0.50 - 1.10 mg/dL Final  . Calcium 10/16/2013 9.2  8.4 - 10.5 mg/dL Final  . GFR calc non Af Amer 10/16/2013 >90  >90 mL/min Final  . GFR calc Af Amer 10/16/2013 >90  >90 mL/min Final   Comment: (NOTE)                           The eGFR has been calculated using the CKD EPI equation.  This calculation has not been validated in all clinical situations.                          eGFR's persistently <90 mL/min signify possible Chronic Kidney                          Disease.  . WBC 10/16/2013 11.0* 4.0 - 10.5 K/uL Final  . RBC 10/16/2013 5.10  3.87 - 5.11 MIL/uL Final  . Hemoglobin 10/16/2013 14.5  12.0 - 15.0 g/dL Final  . HCT 10/16/2013 45.0  36.0 - 46.0 % Final  . MCV 10/16/2013 88.2  78.0 - 100.0 fL Final  . MCH 10/16/2013 28.4  26.0 - 34.0 pg Final  . MCHC 10/16/2013 32.2  30.0 - 36.0 g/dL Final  . RDW 10/16/2013 17.8* 11.5 - 15.5 % Final  . Platelets 10/16/2013 244  150 - 400 K/uL Final  . Glucose-Capillary 10/15/2013 219* 70 - 99 mg/dL Final  . MRSA by PCR 10/16/2013 POSITIVE* NEGATIVE Final   Comment:                                 The GeneXpert MRSA Assay (FDA                          approved for NASAL specimens                          only), is one component of a                          comprehensive MRSA colonization                          surveillance program. It is not                          intended to diagnose MRSA                          infection nor to guide or                          monitor treatment for                          MRSA infections.                          RESULT CALLED TO, READ BACK BY AND VERIFIED WITH:                          COFFEY,J. AT 1810 ON 10/16/2013 BY BAUGHAM,M.  . Glucose-Capillary 10/16/2013 177* 70 - 99 mg/dL Final  . Comment 1 10/16/2013 Notify RN   Final  . Glucose-Capillary 10/16/2013 182* 70 - 99 mg/dL Final  . Glucose-Capillary 10/16/2013 160* 70 - 99 mg/dL Final  . Comment 1 10/16/2013 Notify RN   Final  . Glucose-Capillary 10/16/2013 196* 70 - 99 mg/dL Final  . Comment 1 10/16/2013 Documented in Chart   Final  . Comment 2 10/16/2013 Notify  RN   Final  . Glucose-Capillary 10/17/2013 172* 70 - 99 mg/dL Final    . Comment 1 10/17/2013 Notify RN   Final  . Glucose-Capillary 10/17/2013 153* 70 - 99 mg/dL Final  . Comment 1 10/17/2013 Notify RN   Final  . Glucose-Capillary 10/17/2013 140* 70 - 99 mg/dL Final  . Comment 1 10/17/2013 Notify RN   Final  . WBC 10/18/2013 6.1  4.0 - 10.5 K/uL Final  . RBC 10/18/2013 4.86  3.87 - 5.11 MIL/uL Final  . Hemoglobin 10/18/2013 13.7  12.0 - 15.0 g/dL Final  . HCT 10/18/2013 43.1  36.0 - 46.0 % Final  . MCV 10/18/2013 88.7  78.0 - 100.0 fL Final  . MCH 10/18/2013 28.2  26.0 - 34.0 pg Final  . MCHC 10/18/2013 31.8  30.0 - 36.0 g/dL Final  . RDW 10/18/2013 17.7* 11.5 - 15.5 % Final  . Platelets 10/18/2013 239  150 - 400 K/uL Final  . Glucose-Capillary 10/17/2013 184* 70 - 99 mg/dL Final  . Glucose-Capillary 10/18/2013 169* 70 - 99 mg/dL Final  . Comment 1 10/18/2013 Notify RN   Final  . Glucose-Capillary 10/18/2013 168* 70 - 99 mg/dL Final  . Comment 1 10/18/2013 Notify RN   Final  . Glucose-Capillary 10/18/2013 129* 70 - 99 mg/dL Final  . Comment 1 10/18/2013 Notify RN   Final    PATHOLOGY:  B-raf not mutated.  Urinalysis    Component Value Date/Time   COLORURINE YELLOW 10/15/2013 0601   APPEARANCEUR HAZY* 10/15/2013 0601   LABSPEC 1.020 10/15/2013 0601   PHURINE 6.0 10/15/2013 0601   GLUCOSEU NEGATIVE 10/15/2013 0601   HGBUR SMALL* 10/15/2013 0601   BILIRUBINUR NEGATIVE 10/15/2013 0601   KETONESUR NEGATIVE 10/15/2013 0601   PROTEINUR 30* 10/15/2013 0601   UROBILINOGEN 0.2 10/15/2013 0601   NITRITE NEGATIVE 10/15/2013 0601   LEUKOCYTESUR NEGATIVE 10/15/2013 0601    RADIOGRAPHIC STUDIES:   Dg Chest 2 View  10/15/2013   CLINICAL DATA:  Chest pain, back pain, fever. History of metastatic melanoma.  EXAM: CHEST  2 VIEW  COMPARISON:  CT ABD/PELV WO CM dated 07/07/2013; DG CHEST 2 VIEW dated 11/28/2012  FINDINGS: Normal heart size and pulmonary vascularity. Linear scarring in the right upper lung. Mild hyperinflation. No focal airspace disease or consolidation in  the lungs. No focal parenchymal nodules or masses identified. Hilar structures are symmetrical. No change since previous study.  IMPRESSION: No active cardiopulmonary disease.   Electronically Signed   By: Lucienne Capers M.D.   On: 10/15/2013 05:22   Ct Chest Wo Contrast  11/01/2013   CLINICAL DATA:  Metastatic melanoma. Restaging exam. Subsequent treatment strategy. IV contrast allergy.  EXAM: CT CHEST, ABDOMEN, AND PELVIS without CONTRAST  TECHNIQUE: Multidetector CT imaging of the chest, abdomen and pelvis was performed following the standard protocol.  COMPARISON:  CT 07/07/2013.  FINDINGS: CT CHEST FINDINGS  No axillary or supraclavicular lymphadenopathy. No mediastinal or hilar lymphadenopathy. No pericardial fluid. Coronary calcifications are noted. Esophagus is normal. No cutaneous lesions in the thorax.  Review of the lung parenchyma demonstrates linear scarring in the right upper lobe. No nodularity. Within the left lower lobe, there is a rounded 5 mm pulmonary nodule which abuts the pleural surface (image 38, series 3). This nodule is not changed in size compared to prior.  CT ABDOMEN AND PELVIS FINDINGS  No focal hepatic lesion on this noncontrast exam. Gallbladder, pancreas, spleen, adrenal glands are normal.  The stomach, small bowel, and arececum normal. The  colon demonstrates several diverticula of the descending colon and sigmoid colon. No acute inflammation.  Abdominal aorta is normal caliber. No retroperitoneal periportal lymphadenopathy.  No peritoneal disease or omental disease.  No free fluid the pelvis. Post hysterectomy anatomy. The bladder and adnexa appear normal. There is small left external iliac lymph node measuring 7 mm short axis (image 100) unchanged from prior. No inguinal adenopathy. No cutaneous lesions in the abdomen or pelvis  IMPRESSION: 1. No evidence of melanoma recurrence within the chest, abdomen, or pelvis. 2. Stable small left lower lobe pulmonary nodule.    Electronically Signed   By: Suzy Bouchard M.D.   On: 11/01/2013 17:10    ASSESSMENT:  #1. Metastatic melanoma to the right lung status post resection by Dr. Servando Snare on 10/21/2012, BRAF mutation not detected. Follow-up CT scans thus far negative for recurrence.  #2Verlon Au + Polycythemia Vera, not requiring phlebotomy or chemotherapy.  #3. Diabetes mellitus, type II, non-insulin requiring. #4. Chronic back pain. #5. Episode of pyelonephritis in April 2015    PLAN:  #1. Continue watchful expectation. #2. Followup in 3 months with repeat CT scan without contrast due to allergy along with CBC, chem profile, and hemoglobin A1c.   All questions were answered. The patient knows to call the clinic with any problems, questions or concerns. We can certainly see the patient much sooner if necessary.   I spent 25 minutes counseling the patient face to face. The total time spent in the appointment was 30 minutes.    Farrel Gobble, MD 11/07/2013 1:56 PM  DISCLAIMER:  This note was dictated with voice recognition software.  Similar sounding words can inadvertently be transcribed inaccurately and may not be corrected upon review.

## 2013-11-10 ENCOUNTER — Telehealth: Payer: Self-pay | Admitting: *Deleted

## 2013-11-10 NOTE — Telephone Encounter (Signed)
noted 

## 2013-11-10 NOTE — Telephone Encounter (Signed)
Davina from [facility] called stating she has attempted to contact pt by phone x3 and then sent out letter pt has not attempted to return call and this has been since mid April Davina says she is going to close out the referral.

## 2013-11-22 ENCOUNTER — Ambulatory Visit (INDEPENDENT_AMBULATORY_CARE_PROVIDER_SITE_OTHER): Payer: Medicare HMO | Admitting: Family Medicine

## 2013-11-22 ENCOUNTER — Encounter: Payer: Self-pay | Admitting: Family Medicine

## 2013-11-22 VITALS — BP 140/82 | HR 84 | Temp 98.2°F | Resp 16 | Ht 68.0 in | Wt 258.0 lb

## 2013-11-22 DIAGNOSIS — I10 Essential (primary) hypertension: Secondary | ICD-10-CM

## 2013-11-22 DIAGNOSIS — E119 Type 2 diabetes mellitus without complications: Secondary | ICD-10-CM

## 2013-11-22 DIAGNOSIS — E785 Hyperlipidemia, unspecified: Secondary | ICD-10-CM

## 2013-11-22 MED ORDER — OXYCODONE-ACETAMINOPHEN 7.5-325 MG PO TABS: 1.0000 | ORAL_TABLET | Freq: Three times a day (TID) | ORAL | Status: DC | PRN

## 2013-11-22 MED ORDER — METFORMIN HCL 1000 MG PO TABS: 1000.0000 mg | ORAL_TABLET | Freq: Two times a day (BID) | ORAL | Status: DC

## 2013-11-22 NOTE — Progress Notes (Signed)
Patient ID: Katelyn Cline, female   DOB: [birth year], 53 y.o.   MRN: [id number]   Subjective:    Patient ID: Katelyn Cline, female    DOB: January 09, 1961, 53 y.o.   MRN: [id number]  Patient presents for 4 week F/U  Patient here for interim followup of diabetes mellitus and hypertension. She states that she had some of her medications but is now out of her Lipitor one of her blood pressure medicines. She had been taking them on a regular basis. She's also taking her metformin but needs some testing supplies. She states that her sugars have been high her 14 day average is 160 they have ranged from 60-190 fasting, no hypoglycemia. Medications reviewed of note her repeat urine culture was negative after treatment for pyelonephritis   Review Of Systems:  GEN- denies fatigue, fever, weight loss,weakness, recent illness HEENT- denies eye drainage, change in vision, nasal discharge, CVS- denies chest pain, palpitations RESP- denies SOB, cough, wheeze ABD- denies N/V, change in stools, abd pain MSK- + joint pain, muscle aches, injury Neuro- denies headache, dizziness, syncope, seizure activity       Objective:    BP 140/82  Pulse 84  Temp(Src) 98.2 F (36.8 C) (Oral)  Resp 16  Ht 5\' 8"  (1.727 m)  Wt 258 lb (117.028 kg)  BMI 39.24 kg/m2 GEN- NAD, alert and oriented x3 CVS- RRR, no murmur RESP-CTAB EXT- No edema Pulses- Radial 2+        Assessment & Plan:      Problem List Items Addressed This Visit   None      Note: This dictation was prepared with Dragon dictation along with smaller phrase technology. Any transcriptional errors that result from this process are unintentional.

## 2013-11-22 NOTE — Patient Instructions (Signed)
Take the crestor until you run out Increase metformin to 1000mg  twice a day Pain medication refilled F/U 3 months

## 2013-11-23 ENCOUNTER — Encounter: Payer: Self-pay | Admitting: Family Medicine

## 2013-11-23 NOTE — Assessment & Plan Note (Signed)
Increase metformin to 1000mg  BID

## 2013-11-23 NOTE — Assessment & Plan Note (Signed)
Discussed importance of BP medications and taking on regular basis, [facility] will be assisting patient, states she has been in contact with them Would benefit from ACEI, with the DM, consider changing to lisinopril HCTZ , difficulty is getting her to keep up with meds and finances currently

## 2013-11-23 NOTE — Assessment & Plan Note (Signed)
crestor 10mg  samples give #30 until she can get her lipitor

## 2013-12-27 ENCOUNTER — Telehealth: Payer: Self-pay | Admitting: Family Medicine

## 2013-12-27 NOTE — Telephone Encounter (Signed)
Patient needs refill on pain medication  [phone]

## 2013-12-27 NOTE — Telephone Encounter (Signed)
Ok to refill Percocet??  Last office visit/ refill 11/22/2013.

## 2013-12-28 MED ORDER — OXYCODONE-ACETAMINOPHEN 7.5-325 MG PO TABS: 1.0000 | ORAL_TABLET | Freq: Three times a day (TID) | ORAL | Status: DC | PRN

## 2013-12-28 NOTE — Telephone Encounter (Signed)
ok 

## 2013-12-28 NOTE — Telephone Encounter (Signed)
Prescription printed and patient made aware to come to office to pick up.  

## 2014-01-06 ENCOUNTER — Other Ambulatory Visit: Payer: Self-pay | Admitting: Family Medicine

## 2014-01-06 DIAGNOSIS — E785 Hyperlipidemia, unspecified: Secondary | ICD-10-CM

## 2014-01-26 ENCOUNTER — Telehealth: Payer: Self-pay | Admitting: Family Medicine

## 2014-01-26 MED ORDER — OXYCODONE-ACETAMINOPHEN 7.5-325 MG PO TABS: 1.0000 | ORAL_TABLET | Freq: Three times a day (TID) | ORAL | Status: DC | PRN

## 2014-01-26 NOTE — Telephone Encounter (Signed)
Patient is wanted rx for percocet  [phone]

## 2014-01-26 NOTE — Telephone Encounter (Signed)
Prescription printed and patient made aware to come to office to pick up.  

## 2014-01-26 NOTE — Telephone Encounter (Signed)
okay

## 2014-01-26 NOTE — Telephone Encounter (Signed)
Ok to refill??  Last office visit 11/22/2013.  Last refill 12/28/2013.

## 2014-01-31 ENCOUNTER — Ambulatory Visit (HOSPITAL_COMMUNITY): Admission: RE | Admit: 2014-01-31 | Payer: Medicare HMO | Source: Ambulatory Visit

## 2014-01-31 ENCOUNTER — Other Ambulatory Visit (HOSPITAL_COMMUNITY): Payer: Medicare HMO

## 2014-01-31 IMAGING — CR DG CHEST 2V
2 series · 2 of 2 positions shown · non-contrast
Comparison: October 25, 2012.

CLINICAL DATA: Status post thoracic surgery.

CHEST - 2 VIEW

[w chest pa]
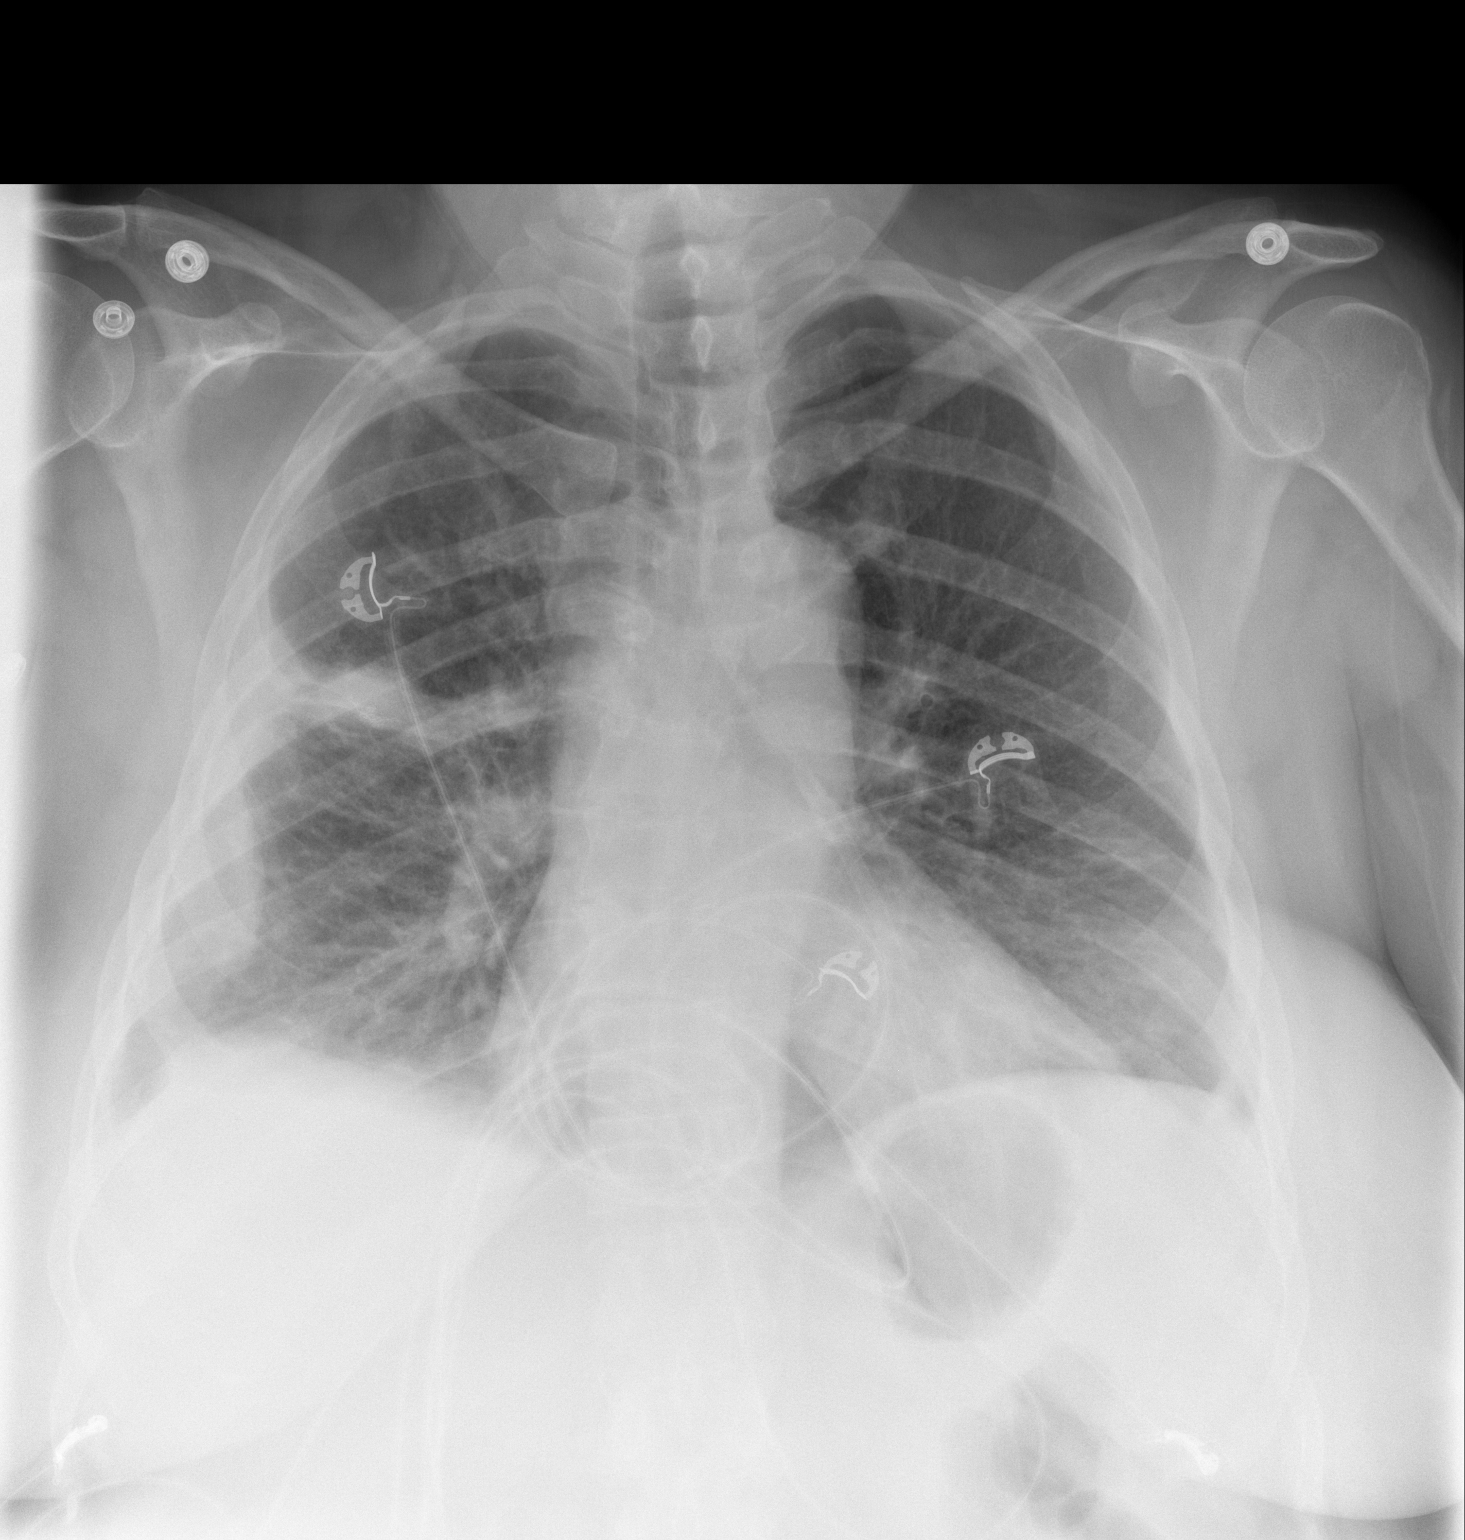

[w chest lat]
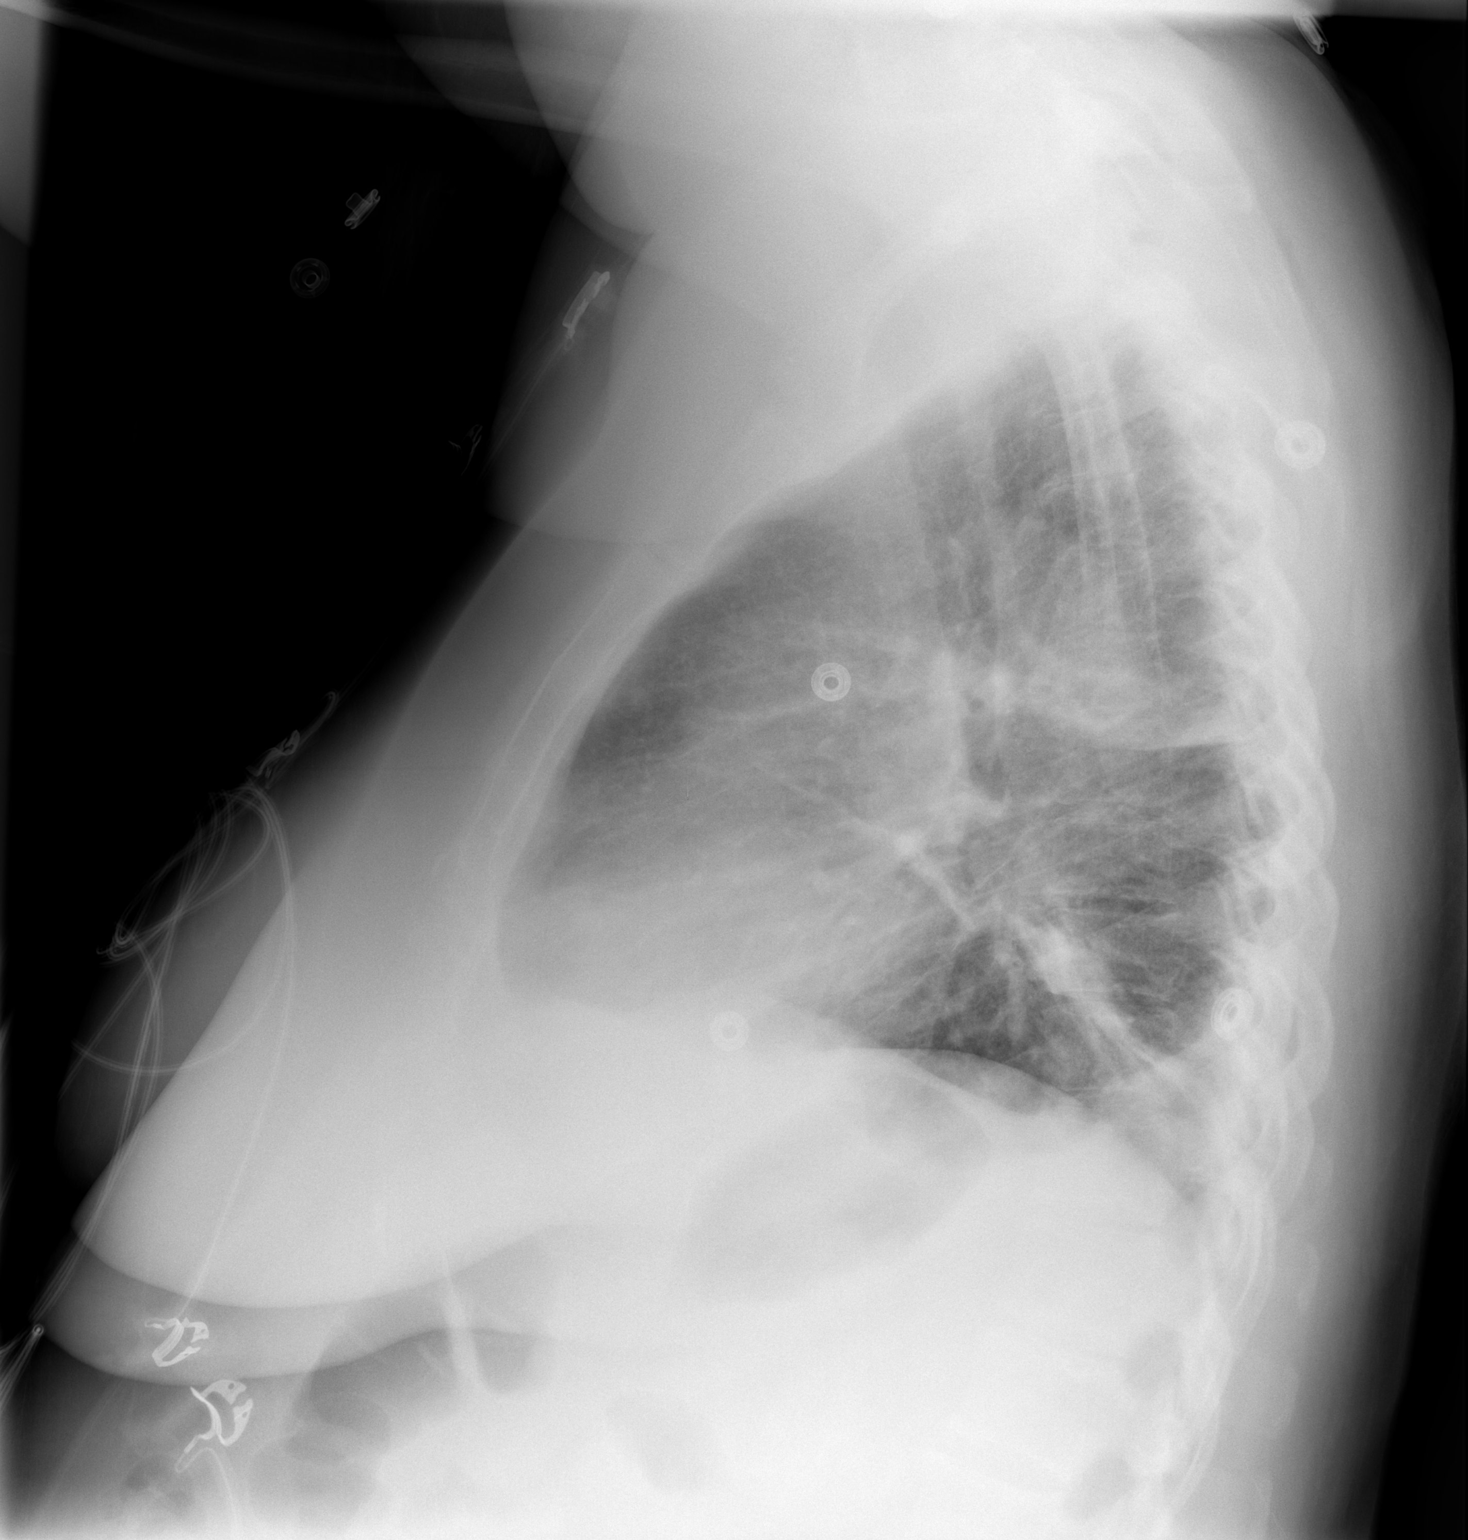

[2 of 2 positions shown; findings below may reference images not displayed]

FINDINGS: Cardiomediastinal silhouette appears normal.  No
pneumothorax is noted.  No change is seen involving linear opacity
involving the right upper lobe compared to prior exam consistent
with pneumonia or atelectasis.  No change is seen involving
lobulated density seen laterally in the right lower lobe which may
represent pneumonia, atelectasis or effusion.  Left basilar opacity
noted on prior exam is slightly improved.
IMPRESSION: Slightly improved left basilar opacity compared to prior exam.
Right lung opacities do not appear to be significantly changed
compared to prior exam.

## 2014-02-07 ENCOUNTER — Other Ambulatory Visit: Payer: Medicare HMO

## 2014-02-07 ENCOUNTER — Ambulatory Visit (HOSPITAL_COMMUNITY): Payer: Medicare HMO

## 2014-02-14 ENCOUNTER — Encounter: Payer: Self-pay | Admitting: Family Medicine

## 2014-02-14 ENCOUNTER — Ambulatory Visit (INDEPENDENT_AMBULATORY_CARE_PROVIDER_SITE_OTHER): Payer: Medicare HMO | Admitting: Family Medicine

## 2014-02-14 VITALS — BP 138/78 | HR 68 | Temp 98.3°F | Resp 14 | Ht 67.0 in | Wt 253.0 lb

## 2014-02-14 DIAGNOSIS — E669 Obesity, unspecified: Secondary | ICD-10-CM

## 2014-02-14 DIAGNOSIS — E785 Hyperlipidemia, unspecified: Secondary | ICD-10-CM

## 2014-02-14 DIAGNOSIS — M5137 Other intervertebral disc degeneration, lumbosacral region: Secondary | ICD-10-CM

## 2014-02-14 DIAGNOSIS — K529 Noninfective gastroenteritis and colitis, unspecified: Secondary | ICD-10-CM

## 2014-02-14 DIAGNOSIS — M5136 Other intervertebral disc degeneration, lumbar region: Secondary | ICD-10-CM

## 2014-02-14 DIAGNOSIS — E119 Type 2 diabetes mellitus without complications: Secondary | ICD-10-CM

## 2014-02-14 DIAGNOSIS — K5289 Other specified noninfective gastroenteritis and colitis: Secondary | ICD-10-CM

## 2014-02-14 DIAGNOSIS — I10 Essential (primary) hypertension: Secondary | ICD-10-CM

## 2014-02-14 LAB — CBC WITH DIFFERENTIAL/PLATELET
BASOS ABS: 0 10*3/uL (ref 0.0–0.1)
Basophils Relative: 0 % (ref 0–1)
EOS ABS: 0.3 10*3/uL (ref 0.0–0.7)
EOS PCT: 2 % (ref 0–5)
HCT: 48.8 % — ABNORMAL HIGH (ref 36.0–46.0)
Hemoglobin: 17 g/dL — ABNORMAL HIGH (ref 12.0–15.0)
Lymphocytes Relative: 21 % (ref 12–46)
Lymphs Abs: 2.7 10*3/uL (ref 0.7–4.0)
MCH: 29.6 pg (ref 26.0–34.0)
MCHC: 34.8 g/dL (ref 30.0–36.0)
MCV: 84.9 fL (ref 78.0–100.0)
Monocytes Absolute: 0.6 10*3/uL (ref 0.1–1.0)
Monocytes Relative: 5 % (ref 3–12)
NEUTROS PCT: 72 % (ref 43–77)
Neutro Abs: 9.2 10*3/uL — ABNORMAL HIGH (ref 1.7–7.7)
PLATELETS: 304 10*3/uL (ref 150–400)
RBC: 5.75 MIL/uL — AB (ref 3.87–5.11)
RDW: 16.2 % — AB (ref 11.5–15.5)
WBC: 12.8 10*3/uL — ABNORMAL HIGH (ref 4.0–10.5)

## 2014-02-14 LAB — COMPREHENSIVE METABOLIC PANEL
ALT: 14 U/L (ref 0–35)
AST: 13 U/L (ref 0–37)
Albumin: 4.2 g/dL (ref 3.5–5.2)
Alkaline Phosphatase: 135 U/L — ABNORMAL HIGH (ref 39–117)
BILIRUBIN TOTAL: 0.4 mg/dL (ref 0.2–1.2)
BUN: 13 mg/dL (ref 6–23)
CO2: 21 mEq/L (ref 19–32)
CREATININE: 0.63 mg/dL (ref 0.50–1.10)
Calcium: 10 mg/dL (ref 8.4–10.5)
Chloride: 96 mEq/L (ref 96–112)
Glucose, Bld: 116 mg/dL — ABNORMAL HIGH (ref 70–99)
Potassium: 4.3 mEq/L (ref 3.5–5.3)
SODIUM: 137 meq/L (ref 135–145)
Total Protein: 7.2 g/dL (ref 6.0–8.3)

## 2014-02-14 LAB — URINALYSIS, ROUTINE W REFLEX MICROSCOPIC
BILIRUBIN URINE: NEGATIVE
Glucose, UA: NEGATIVE mg/dL
Hgb urine dipstick: NEGATIVE
Ketones, ur: NEGATIVE mg/dL
LEUKOCYTES UA: NEGATIVE
Nitrite: NEGATIVE
PROTEIN: NEGATIVE mg/dL
SPECIFIC GRAVITY, URINE: 1.015 (ref 1.005–1.030)
Urobilinogen, UA: 0.2 mg/dL (ref 0.0–1.0)
pH: 7 (ref 5.0–8.0)

## 2014-02-14 LAB — LIPID PANEL
CHOL/HDL RATIO: 8.7 ratio
Cholesterol: 279 mg/dL — ABNORMAL HIGH (ref 0–200)
HDL: 32 mg/dL — ABNORMAL LOW (ref >39)
TRIGLYCERIDES: 633 mg/dL — AB (ref <150)

## 2014-02-14 LAB — HEMOGLOBIN A1C
Hgb A1c MFr Bld: 7 % — ABNORMAL HIGH (ref <5.7)
Mean Plasma Glucose: 154 mg/dL — ABNORMAL HIGH (ref <117)

## 2014-02-14 MED ORDER — OXYCODONE-ACETAMINOPHEN 7.5-325 MG PO TABS: 1.0000 | ORAL_TABLET | Freq: Three times a day (TID) | ORAL | Status: DC | PRN

## 2014-02-14 NOTE — Patient Instructions (Addendum)
We will call with lab results Pain medication given Keep up with fluids Reschedule with cancer doctor  F/U 3 months

## 2014-02-15 ENCOUNTER — Encounter: Payer: Self-pay | Admitting: Family Medicine

## 2014-02-15 DIAGNOSIS — K529 Noninfective gastroenteritis and colitis, unspecified: Secondary | ICD-10-CM | POA: Insufficient documentation

## 2014-02-15 LAB — MICROALBUMIN / CREATININE URINE RATIO
CREATININE, URINE: 68.8 mg/dL
MICROALB UR: 3.27 mg/dL — AB (ref 0.00–1.89)
Microalb Creat Ratio: 47.5 mg/g — ABNORMAL HIGH (ref 0.0–30.0)

## 2014-02-15 NOTE — Assessment & Plan Note (Signed)
Benign exam, push fluids, declines anti-emetics

## 2014-02-15 NOTE — Assessment & Plan Note (Signed)
Check A1c, goal < 7% Dicussed bring meter to visit and meds

## 2014-02-15 NOTE — Assessment & Plan Note (Signed)
Well controlled, no change to meds 

## 2014-02-15 NOTE — Progress Notes (Signed)
Patient ID: Katelyn Cline, female   DOB: 10/13/1960, 53 y.o.   MRN: [id number]   Subjective:    Patient ID: Katelyn Cline, female    DOB: Aug 24, 1960, 53 y.o.   MRN: [id number]  Patient presents for 3 month F/U and Pain  Pt here to f/u chronic medical problems 1. DM- did not bring meter, states fasting 140-148, no hypoglycemia, no polyuria  2. Right side pain, diarrhea, nausea no emesis past few days, +sick contact son has same GI symptoms, no fever, no blood in stools, no dysuria  3. DDD- request refill on pain medication  4. Polycythemia due for repeat CBC , missed f/u with oncology for this and her metastatic melanoma Meds reviewed.    Review Of Systems:  GEN- denies fatigue, fever, weight loss,weakness, recent illness HEENT- denies eye drainage, change in vision, nasal discharge, CVS- denies chest pain, palpitations RESP- denies SOB, cough, wheeze ABD- denies N/V, +change in stools, +abd pain GU- denies dysuria, hematuria, dribbling, incontinence MSK- +oint pain, muscle aches, injury Neuro- denies headache, dizziness, syncope, seizure activity       Objective:    BP 138/78  Pulse 68  Temp(Src) 98.3 F (36.8 C) (Oral)  Resp 14  Ht 5\' 7"  (1.702 m)  Wt 253 lb (114.76 kg)  BMI 39.62 kg/m2 GEN- NAD, alert and oriented x3 HEENT- PERRL, EOMI, non injected sclera, pink conjunctiva, MMM, oropharynx clear Neck- Supple, no thyromegaly CVS- RRR, no murmur, chest wall NT RESP-good air movement, no wheeze ABD-NABS,soft,NT,ND EXT- No edema Pulses- Radial, DP- 2+         Assessment & Plan:      Problem List Items Addressed This Visit   Type II or unspecified type diabetes mellitus without mention of complication, not stated as uncontrolled     Check A1c, goal < 7% Dicussed bring meter to visit and meds    Relevant Orders      Microalbumin / creatinine urine ratio (Completed)      Hemoglobin A1c (Completed)   Obesity   Gastroenteritis     Benign exam, push  fluids, declines anti-emetics    Essential hypertension, benign - Primary     Well controlled, no change to meds    Relevant Orders      CBC with Differential (Completed)      Comprehensive metabolic panel (Completed)   DDD (degenerative disc disease), lumbar   Relevant Medications      oxyCODONE-acetaminophen (PERCOCET) 7.5-325 MG per tablet    Other Visit Diagnoses   Other and unspecified noninfectious gastroenteritis and colitis(558.9)        Relevant Orders       Urinalysis, Routine w reflex microscopic (Completed)    Other and unspecified hyperlipidemia           Note: This dictation was prepared with Dragon dictation along with smaller phrase technology. Any transcriptional errors that result from this process are unintentional.

## 2014-02-17 ENCOUNTER — Other Ambulatory Visit: Payer: Self-pay | Admitting: *Deleted

## 2014-02-17 MED ORDER — LISINOPRIL 2.5 MG PO TABS: 2.5000 mg | ORAL_TABLET | Freq: Every day | ORAL | Status: DC

## 2014-02-23 ENCOUNTER — Other Ambulatory Visit: Payer: Self-pay | Admitting: Family Medicine

## 2014-02-24 NOTE — Telephone Encounter (Signed)
Refill appropriate and filled per protocol. 

## 2014-03-13 ENCOUNTER — Telehealth: Payer: Self-pay | Admitting: Family Medicine

## 2014-03-13 MED ORDER — IBUPROFEN 800 MG PO TABS: 800.0000 mg | ORAL_TABLET | Freq: Three times a day (TID) | ORAL | Status: DC | PRN

## 2014-03-13 NOTE — Telephone Encounter (Signed)
Prescription sent to pharmacy.

## 2014-03-13 NOTE — Telephone Encounter (Signed)
[facility] [place] Patient is requesting 800 mg ibuprofen, has new pharmacy that is why she is calling us

## 2014-04-09 ENCOUNTER — Telehealth: Payer: Self-pay | Admitting: Family Medicine

## 2014-04-09 MED ORDER — OXYCODONE-ACETAMINOPHEN 7.5-325 MG PO TABS: 1.0000 | ORAL_TABLET | Freq: Three times a day (TID) | ORAL | Status: DC | PRN

## 2014-04-09 NOTE — Telephone Encounter (Signed)
Okay to refill? 

## 2014-04-09 NOTE — Telephone Encounter (Signed)
Ok to refill??  Last office visit 02/14/2014.  Last refill 02/25/2014.

## 2014-04-09 NOTE — Telephone Encounter (Signed)
Prescription printed and patient made aware to come to office to pick up.  

## 2014-04-09 NOTE — Telephone Encounter (Signed)
Patient calling to pick up rx for oxycodone  Please call her at [phone] when finished

## 2014-04-10 ENCOUNTER — Other Ambulatory Visit: Payer: Self-pay | Admitting: Family Medicine

## 2014-04-11 NOTE — Telephone Encounter (Signed)
Medication refilled per protocol. 

## 2014-05-10 ENCOUNTER — Encounter (HOSPITAL_COMMUNITY): Payer: Self-pay

## 2014-05-10 ENCOUNTER — Encounter (HOSPITAL_COMMUNITY): Payer: Commercial Managed Care - HMO | Attending: Hematology and Oncology

## 2014-05-10 DIAGNOSIS — C799 Secondary malignant neoplasm of unspecified site: Secondary | ICD-10-CM | POA: Insufficient documentation

## 2014-05-10 DIAGNOSIS — D751 Secondary polycythemia: Secondary | ICD-10-CM | POA: Diagnosis not present

## 2014-05-10 DIAGNOSIS — D45 Polycythemia vera: Secondary | ICD-10-CM

## 2014-05-10 DIAGNOSIS — E119 Type 2 diabetes mellitus without complications: Secondary | ICD-10-CM | POA: Diagnosis not present

## 2014-05-10 DIAGNOSIS — C439 Malignant melanoma of skin, unspecified: Secondary | ICD-10-CM

## 2014-05-10 DIAGNOSIS — M5489 Other dorsalgia: Secondary | ICD-10-CM

## 2014-05-10 DIAGNOSIS — Z85118 Personal history of other malignant neoplasm of bronchus and lung: Secondary | ICD-10-CM

## 2014-05-10 LAB — CBC WITH DIFFERENTIAL/PLATELET
BASOS ABS: 0.1 10*3/uL (ref 0.0–0.1)
BASOS PCT: 0 % (ref 0–1)
Eosinophils Absolute: 0.2 10*3/uL (ref 0.0–0.7)
Eosinophils Relative: 2 % (ref 0–5)
HEMATOCRIT: 54.3 % — AB (ref 36.0–46.0)
Hemoglobin: 18.3 g/dL — ABNORMAL HIGH (ref 12.0–15.0)
Lymphocytes Relative: 17 % (ref 12–46)
Lymphs Abs: 2.4 10*3/uL (ref 0.7–4.0)
MCH: 31.1 pg (ref 26.0–34.0)
MCHC: 33.7 g/dL (ref 30.0–36.0)
MCV: 92.2 fL (ref 78.0–100.0)
MONO ABS: 0.6 10*3/uL (ref 0.1–1.0)
Monocytes Relative: 4 % (ref 3–12)
Neutro Abs: 10.7 10*3/uL — ABNORMAL HIGH (ref 1.7–7.7)
Neutrophils Relative %: 77 % (ref 43–77)
Platelets: 295 10*3/uL (ref 150–400)
RBC: 5.89 MIL/uL — ABNORMAL HIGH (ref 3.87–5.11)
RDW: 16 % — ABNORMAL HIGH (ref 11.5–15.5)
WBC: 14 10*3/uL — ABNORMAL HIGH (ref 4.0–10.5)

## 2014-05-10 LAB — COMPREHENSIVE METABOLIC PANEL
ALBUMIN: 3.9 g/dL (ref 3.5–5.2)
ALT: 15 U/L (ref 0–35)
AST: 16 U/L (ref 0–37)
Alkaline Phosphatase: 143 U/L — ABNORMAL HIGH (ref 39–117)
Anion gap: 14 (ref 5–15)
BILIRUBIN TOTAL: 0.3 mg/dL (ref 0.3–1.2)
BUN: 18 mg/dL (ref 6–23)
CALCIUM: 10.7 mg/dL — AB (ref 8.4–10.5)
CHLORIDE: 99 meq/L (ref 96–112)
CO2: 30 mEq/L (ref 19–32)
CREATININE: 0.73 mg/dL (ref 0.50–1.10)
GFR calc Af Amer: >90 mL/min (ref >90)
GFR calc non Af Amer: >90 mL/min (ref >90)
Glucose, Bld: 151 mg/dL — ABNORMAL HIGH (ref 70–99)
Potassium: 5 mEq/L (ref 3.7–5.3)
SODIUM: 143 meq/L (ref 137–147)
Total Protein: 7.9 g/dL (ref 6.0–8.3)

## 2014-05-10 LAB — LACTATE DEHYDROGENASE: LDH: 264 U/L — ABNORMAL HIGH (ref 94–250)

## 2014-05-10 NOTE — Progress Notes (Signed)
Katelyn Cline's reason for visit today are for labs as scheduled per MD orders.  Venipuncture performed with a 23 gauge butterfly needle to R Antecubital.  Katelyn Cline tolerated venipuncture well and without incident; questions were answered and patient was discharged.

## 2014-05-10 NOTE — Progress Notes (Signed)
[place]  OFFICE PROGRESS NOTE  [name], MD [address] [place] [address]  DIAGNOSIS: Metastatic melanoma - Plan: CBC with Differential, Comprehensive metabolic panel, CBC with Differential, Comprehensive metabolic panel, Lactate dehydrogenase, Lactate dehydrogenase, Lactate dehydrogenase, CT Abdomen Pelvis W Contrast, CT Chest W Contrast  Polycythemia, JAK-2 positive - Plan: CBC with Differential, Comprehensive metabolic panel, Lactate dehydrogenase, Lactate dehydrogenase, Lactate dehydrogenase, Phlebotomy therapeutic  Diabetes - Plan: Hemoglobin A1c, CBC with Differential, Comprehensive metabolic panel, Lactate dehydrogenase, Lactate dehydrogenase, Lactate dehydrogenase  Chief Complaint  Patient presents with  . Metastatic melanoma to lung    not B-RAF mutated.  . polycythemia vera    JAK-2 positive    CURRENT THERAPY: watchful expectation and surveillance for metastatic melanoma to lung, status post resection and JAK-2 positive polycythemia vera.  INTERVAL HISTORY: Katelyn Cline 53 y.o. female returns for followup of metastatic melanoma to lung, status post resection in this setting of polycythemia vera JAK-2 positive, undergoing periodic phlebotomy. She has had more difficulty in maintaining her blood sugar within normal range. She's had occasional headaches and nausea with no fever, night sweats, diarrhea, constipation, melena, hematochezia, hematuria, vaginal bleeding, lower extremity swelling or redness, skin rash, headache, or seizures. She denies easy satiety, pruritus, epistaxis, cough, or wheezing.  MEDICAL HISTORY: Past Medical History  Diagnosis Date  . Narcolepsy   . HTN (hypertension)   . Fibromyalgia   . Scoliosis   . Migraines   . Major depression   . Panic attacks   . Agoraphobia   . Chronic pain   . Miscarriage     x 4  . Peripheral neuropathy   . Hyperlipidemia   . Elevated hemoglobin  2014  . Vertigo   . Polycythemia secondary to smoking   . Dysrhythmia     Hx: of palpitations "a long time ago"  . Shortness of breath     Hx: of with activity  . Sleep apnea   . Basal cell carcinoma   . Polycythemia vera(238.4)   . Metastatic melanoma 10/18/2012    12 mm posterior right upper lobe pulmonary nodule, max SUV 3.0    . Chronic respiratory failure with hypoxia     3L [place]  . Peripheral neuropathy     in feet  . DM type 2 (diabetes mellitus, type 2) 10/16/2013  . History of UTI     INTERIM HISTORY: has DDD (degenerative disc disease), lumbar; DDD (degenerative disc disease), cervical; Chronic pain disorder; Depression; Fibromyalgia; Hyperlipidemia; Narcolepsy; Peripheral neuropathy; Tobacco use disorder; Obesity; Essential hypertension, benign; Non-healing skin lesion of nose; Polycythemia; Polycythemia secondary to smoking; Metastatic melanoma; Type II or unspecified type diabetes mellitus without mention of complication, not stated as uncontrolled; Chronic hypoxemic respiratory failure; Basal cell carcinoma of skin; Posttraumatic stress disorder; MRSA (methicillin resistant staph aureus) culture positive; and Gastroenteritis on her problem list.   Dr. Harriet Masson at [facility]. The patient qualified for a clinical trial, but [facility] could not afford her medical care associated with this trial. As a result, the patient has opted out of the trial. Dr. Tressie Stalker discussed this information with Dr. Harriet Masson and she recommends close surveillance of the patient as a result, and that is what we will do. I personally spoke with Dr. Harriet Masson today regarding the patient to verify that this is the plan.  According to Dr. Harriet Masson , 5 to 67% of patients are cured with surgery alone for  minimal disease. As a result, it would be worthwhile performing surveillance on this patient is at a subjecting her to therapy that may be unnecessary. She recommended CTs of chest, abdomen, and pelvis  every 3 months for surveillance as this carries less radiation risk than a PET scan. Dr. Harriet Masson suspects that the patient will recur within the next year and at that point in time she would be a candidate for Yervoy (Ipilimumab).  CT scan done on 04/05/2013 without contrast showed no evidence of recurrence. CT scan done on 11/01/2013 without contrast showed no evidence of recurrence but with a 75m left lower lobe nodule.  ALLERGIES:  is allergic to contrast media; erythromycin; flagyl; latex; other; penicillins; and tetracyclines & related.  MEDICATIONS: has a current medication list which includes the following prescription(s): amlodipine, aspirin-acetaminophen-caffeine, atorvastatin, bayer contour next test, vitamin d, duloxetine, duloxetine, fish oil-omega-3 fatty acids, gabapentin, ibuprofen, lisinopril, metformin, oxycodone-acetaminophen, triamterene-hydrochlorothiazide, potassium chloride sa, and triamterene-hydrochlorothiazide.  SURGICAL HISTORY:  Past Surgical History  Procedure Laterality Date  . Vaginal hysterectomy    . Cystectomy      abdominal wall   . Basal cell carcinoma excision      flap surgery on face  . Colonoscopy w/ biopsies and polypectomy      Hx: of  . Dilation and curettage of uterus      Hx: of   . Video assisted thoracoscopy (vats)/wedge resection Right 10/21/2012    Procedure: VIDEO ASSISTED THORACOSCOPY (VATS)/WEDGE RESECTION;  Surgeon: EGrace Isaac MD;  [place]  Service: Thoracic;  Laterality: Right;  . Video bronchoscopy N/A 10/21/2012    Procedure: VIDEO BRONCHOSCOPY;  Surgeon: EGrace Isaac MD;  [facility];  Service: Thoracic;  Laterality: N/A;  . Breast lumpectomy      bilateral    FAMILY HISTORY: family history includes ADD / ADHD in her son; Alcohol abuse in her maternal grandfather; Anxiety disorder in her mother; Arthritis in her mother; Dementia in her father and mother; Depression in her mother; Heart disease in her brother  and father; Hyperlipidemia in her father and mother; Hypertension in her father; Stroke in her father. There is no history of Bipolar disorder, Drug abuse, OCD, Paranoid behavior, Schizophrenia, Seizures, Sexual abuse, or Physical abuse.  SOCIAL HISTORY:  reports that she quit smoking about 19 months ago. Her smoking use included Cigarettes. She has a 10 pack-year smoking history. She has never used smokeless tobacco. She reports that she drinks alcohol. She reports that she does not use illicit drugs.  REVIEW OF SYSTEMS:  Other than that discussed above is noncontributory.  PHYSICAL EXAMINATION: ECOG PERFORMANCE STATUS: 1 - Symptomatic but completely ambulatory  Blood pressure 109/74, pulse 95, temperature 98.4 F (36.9 C), temperature source Oral, resp. rate 18, weight 245 lb (111.131 kg), SpO2 96 %.  GENERAL:alert, no distress and comfortable. Morbidly obese. SKIN: skin color, texture, turgor are normal, no rashes or significant lesions EYES: PERLA; Conjunctiva are pink and non-injected, sclera clear SINUSES: No redness or tenderness over maxillary or ethmoid sinuses OROPHARYNX:no exudate, no erythema on lips, buccal mucosa, or tongue. NECK: supple, thyroid normal size, non-tender, without nodularity. No masses CHEST: increased AP diameter with no breast masses. LYMPH:  no palpable lymphadenopathy in the cervical, axillary or inguinal LUNGS: clear to auscultation and percussion with normal breathing effort HEART: regular rate & rhythm and no murmurs. ABDOMEN:abdomen soft, non-tender and normal bowel sounds. Not possible to appreciate internal organs. No free fluid wave or shifting dullness. MUSCULOSKELETAL:no cyanosis  of digits and no clubbing. Range of motion normal.  NEURO: alert & oriented x 3 with fluent speech, no focal motor/sensory deficits   LABORATORY DATA: Office Visit on 05/10/2014  Component Date Value Ref Range Status  . WBC 05/10/2014 14.0* 4.0 - 10.5 K/uL Final  . RBC  05/10/2014 5.89* 3.87 - 5.11 MIL/uL Final  . Hemoglobin 05/10/2014 18.3* 12.0 - 15.0 g/dL Final  . HCT 05/10/2014 54.3* 36.0 - 46.0 % Final  . MCV 05/10/2014 92.2  78.0 - 100.0 fL Final  . MCH 05/10/2014 31.1  26.0 - 34.0 pg Final  . MCHC 05/10/2014 33.7  30.0 - 36.0 g/dL Final  . RDW 05/10/2014 16.0* 11.5 - 15.5 % Final  . Platelets 05/10/2014 295  150 - 400 K/uL Final  . Neutrophils Relative % 05/10/2014 77  43 - 77 % Final  . Neutro Abs 05/10/2014 10.7* 1.7 - 7.7 K/uL Final  . Lymphocytes Relative 05/10/2014 17  12 - 46 % Final  . Lymphs Abs 05/10/2014 2.4  0.7 - 4.0 K/uL Final  . Monocytes Relative 05/10/2014 4  3 - 12 % Final  . Monocytes Absolute 05/10/2014 0.6  0.1 - 1.0 K/uL Final  . Eosinophils Relative 05/10/2014 2  0 - 5 % Final  . Eosinophils Absolute 05/10/2014 0.2  0.0 - 0.7 K/uL Final  . Basophils Relative 05/10/2014 0  0 - 1 % Final  . Basophils Absolute 05/10/2014 0.1  0.0 - 0.1 K/uL Final    PATHOLOGY:no new pathology. JAK-2 positive P vera. Melanoma is V600E mutation negative  Urinalysis    Component Value Date/Time   COLORURINE YELLOW 02/14/2014 1023   APPEARANCEUR CLEAR 02/14/2014 1023   LABSPEC 1.015 02/14/2014 1023   PHURINE 7.0 02/14/2014 1023   GLUCOSEU NEG 02/14/2014 1023   HGBUR NEG 02/14/2014 1023   BILIRUBINUR NEG 02/14/2014 1023   KETONESUR NEG 02/14/2014 1023   PROTEINUR NEG 02/14/2014 1023   UROBILINOGEN 0.2 02/14/2014 1023   NITRITE NEG 02/14/2014 1023   LEUKOCYTESUR NEG 02/14/2014 1023    RADIOGRAPHIC STUDIES: No results found.  ASSESSMENT:  #1. Metastatic melanoma to the right lung status post resection by Dr. Servando Snare on 10/21/2012, BRAF mutation not detected. Follow-up CT scans thus far negative for recurrence. Repeat scan to be done in one week. She missed her last appointment 3 months ago. #2Verlon Au + Polycythemia Vera, not requiring phlebotomy or chemotherapy.  #3. Diabetes mellitus, type II, non-insulin requiring, not well  controlled. #4. Chronic back pain. #5. Episode of pyelonephritis in April 2015, resolved.    PLAN:  #1. CT chest abdomen and pelvis with contrast on 05/17/2014 #2. Phlebotomy ordered for 05/17/2014. #3. Follow-up in 3 months with CBC, chem profile, LDH   All questions were answered. The patient knows to call the clinic with any problems, questions or concerns. We can certainly see the patient much sooner if necessary.   I spent 25 minutes counseling the patient face to face. The total time spent in the appointment was 30 minutes.    Doroteo Bradford, MD 05/10/2014 4:37 PM  DISCLAIMER:  This note was dictated with voice recognition software.  Similar sounding words can inadvertently be transcribed inaccurately and may not be corrected upon review.

## 2014-05-10 NOTE — Patient Instructions (Signed)
[facility] Discharge Instructions  RECOMMENDATIONS MADE BY THE CONSULTANT AND ANY TEST RESULTS WILL BE SENT TO YOUR REFERRING PHYSICIAN.  We will see you in 3 months for office visit and repeat lab work.  We will schedule Ct scans for next week. We are doing lab work today.  CBC, CMET and LDH.  We will call you for any abnormal results.  Please call for any questions or concerns.    Thank you for choosing [facility] to provide your oncology and hematology care.  To afford each patient quality time with our providers, please arrive at least 15 minutes before your scheduled appointment time.  With your help, our goal is to use those 15 minutes to complete the necessary work-up to ensure our physicians have the information they need to help with your evaluation and healthcare recommendations.    Effective January 1st, 2014, we ask that you re-schedule your appointment with our physicians should you arrive 10 or more minutes late for your appointment.  We strive to give you quality time with our providers, and arriving late affects you and other patients whose appointments are after yours.    Again, thank you for choosing [facility].  Our hope is that these requests will decrease the amount of time that you wait before being seen by our physicians.       _____________________________________________________________  Should you have questions after your visit to [facility], please contact our office at [phone] between the hours of 8:30 a.m. and 4:30 p.m.  Voicemails left after 4:30 p.m. will not be returned until the following business day.  For prescription refill requests, have your pharmacy contact our office with your prescription refill request.    _______________________________________________________________  We hope that we have given you very good care.  You may receive a patient satisfaction survey in the mail, please  complete it and return it as soon as possible.  We value your feedback!  _______________________________________________________________  Have you asked about our STAR program?  STAR stands for Survivorship Training and Rehabilitation, and this is a nationally recognized cancer care program that focuses on survivorship and rehabilitation.  Cancer and cancer treatments may cause problems, such as, pain, making you feel tired and keeping you from doing the things that you need or want to do. Cancer rehabilitation can help. Our goal is to reduce these troubling effects and help you have the best quality of life possible.  You may receive a survey from a nurse that asks questions about your current state of health.  Based on the survey results, all eligible patients will be referred to the [facility] program for an evaluation so we can better serve you!  A frequently asked questions sheet is available upon request.

## 2014-05-11 ENCOUNTER — Telehealth: Payer: Self-pay | Admitting: *Deleted

## 2014-05-11 LAB — HEMOGLOBIN A1C
Hgb A1c MFr Bld: 6.5 % — ABNORMAL HIGH (ref <5.7)
MEAN PLASMA GLUCOSE: 140 mg/dL — AB (ref <117)

## 2014-05-11 NOTE — Telephone Encounter (Signed)
Received fax from [facility] care mgmt with [facility] referral authorization for Dr. Farrel Gobble with authorization number [id number] for CT scan thorax with Dx: 43.9, information was also faxed to Dr. Farrel Gobble.

## 2014-05-14 ENCOUNTER — Telehealth: Payer: Self-pay | Admitting: Family Medicine

## 2014-05-14 MED ORDER — OXYCODONE-ACETAMINOPHEN 7.5-325 MG PO TABS: 1.0000 | ORAL_TABLET | Freq: Three times a day (TID) | ORAL | Status: DC | PRN

## 2014-05-14 NOTE — Telephone Encounter (Signed)
Okay to refill? 

## 2014-05-14 NOTE — Telephone Encounter (Signed)
Patient needs rx for her percocet

## 2014-05-14 NOTE — Telephone Encounter (Signed)
Prescription printed and patient made aware to come to office to pick up.  

## 2014-05-14 NOTE — Telephone Encounter (Signed)
Ok to refill??  Last office visit 02/14/2014.  Last refill 04/09/2014.

## 2014-05-16 ENCOUNTER — Encounter (HOSPITAL_BASED_OUTPATIENT_CLINIC_OR_DEPARTMENT_OTHER): Payer: Commercial Managed Care - HMO

## 2014-05-16 ENCOUNTER — Ambulatory Visit (HOSPITAL_COMMUNITY)
Admission: RE | Admit: 2014-05-16 | Discharge: 2014-05-16 | Disposition: A | Discharge status: Home / Self Care | Payer: Medicare HMO | Source: Ambulatory Visit | Attending: Hematology and Oncology | Admitting: Hematology and Oncology

## 2014-05-16 ENCOUNTER — Telehealth (HOSPITAL_COMMUNITY): Payer: Self-pay | Admitting: *Deleted

## 2014-05-16 ENCOUNTER — Encounter (HOSPITAL_COMMUNITY): Payer: Self-pay

## 2014-05-16 ENCOUNTER — Other Ambulatory Visit (HOSPITAL_COMMUNITY): Payer: Self-pay | Admitting: Hematology and Oncology

## 2014-05-16 ENCOUNTER — Other Ambulatory Visit (HOSPITAL_COMMUNITY): Payer: Self-pay | Admitting: *Deleted

## 2014-05-16 DIAGNOSIS — R918 Other nonspecific abnormal finding of lung field: Secondary | ICD-10-CM | POA: Insufficient documentation

## 2014-05-16 DIAGNOSIS — K760 Fatty (change of) liver, not elsewhere classified: Secondary | ICD-10-CM | POA: Insufficient documentation

## 2014-05-16 DIAGNOSIS — R160 Hepatomegaly, not elsewhere classified: Secondary | ICD-10-CM | POA: Diagnosis not present

## 2014-05-16 DIAGNOSIS — I2510 Atherosclerotic heart disease of native coronary artery without angina pectoris: Secondary | ICD-10-CM | POA: Insufficient documentation

## 2014-05-16 DIAGNOSIS — D45 Polycythemia vera: Secondary | ICD-10-CM

## 2014-05-16 DIAGNOSIS — C439 Malignant melanoma of skin, unspecified: Secondary | ICD-10-CM

## 2014-05-16 DIAGNOSIS — D751 Secondary polycythemia: Secondary | ICD-10-CM

## 2014-05-16 DIAGNOSIS — C799 Secondary malignant neoplasm of unspecified site: Secondary | ICD-10-CM

## 2014-05-16 NOTE — Progress Notes (Signed)
Katelyn Cline presents today for phlebotomy per MD orders. Phlebotomy procedure started at 1027 and ended at 1038. 500 cc removed. Patient tolerated procedure well. IV needle removed intact.

## 2014-05-16 NOTE — Telephone Encounter (Signed)
Message left on Katelyn Cline's voicemail that her CT scans were good and that they failed to reveal evidence of progressive melanoma per Dr. Barnet Glasgow.

## 2014-05-16 NOTE — Telephone Encounter (Signed)
-----   Message from Farrel Gobble, MD sent at 05/16/2014  1:13 PM EST ----- Please notify Katelyn Cline that her CAT scans failed to reveal evidence of progressive melanoma. Thank you

## 2014-05-16 NOTE — Patient Instructions (Addendum)
[facility] Discharge Instructions  RECOMMENDATIONS MADE BY THE CONSULTANT AND ANY TEST RESULTS WILL BE SENT TO YOUR REFERRING PHYSICIAN.  EXAM FINDINGS BY THE PHYSICIAN TODAY AND SIGNS OR SYMPTOMS TO REPORT TO CLINIC OR PRIMARY PHYSICIAN:   Phlebotomy performed today. Take bandages off later today.   Call for any problems or questions.    Thank you for choosing [facility] to provide your oncology and hematology care.  To afford each patient quality time with our providers, please arrive at least 15 minutes before your scheduled appointment time.  With your help, our goal is to use those 15 minutes to complete the necessary work-up to ensure our physicians have the information they need to help with your evaluation and healthcare recommendations.    Effective January 1st, 2014, we ask that you re-schedule your appointment with our physicians should you arrive 10 or more minutes late for your appointment.  We strive to give you quality time with our providers, and arriving late affects you and other patients whose appointments are after yours.    Again, thank you for choosing [facility].  Our hope is that these requests will decrease the amount of time that you wait before being seen by our physicians.       _____________________________________________________________  Should you have questions after your visit to [facility], please contact our office at [phone] between the hours of 8:30 a.m. and 4:30 p.m.  Voicemails left after 4:30 p.m. will not be returned until the following business day.  For prescription refill requests, have your pharmacy contact our office with your prescription refill request.    _______________________________________________________________  We hope that we have given you very good care.  You may receive a patient satisfaction survey in the mail, please complete it and return it as soon as  possible.  We value your feedback!  _______________________________________________________________  Have you asked about our STAR program?  STAR stands for Survivorship Training and Rehabilitation, and this is a nationally recognized cancer care program that focuses on survivorship and rehabilitation.  Cancer and cancer treatments may cause problems, such as, pain, making you feel tired and keeping you from doing the things that you need or want to do. Cancer rehabilitation can help. Our goal is to reduce these troubling effects and help you have the best quality of life possible.  You may receive a survey from a nurse that asks questions about your current state of health.  Based on the survey results, all eligible patients will be referred to the [facility] program for an evaluation so we can better serve you!  A frequently asked questions sheet is available upon request.

## 2014-05-18 ENCOUNTER — Encounter: Payer: Self-pay | Admitting: Family Medicine

## 2014-05-18 ENCOUNTER — Ambulatory Visit (INDEPENDENT_AMBULATORY_CARE_PROVIDER_SITE_OTHER): Payer: Medicare HMO | Admitting: Family Medicine

## 2014-05-18 VITALS — BP 130/76 | HR 68 | Temp 98.5°F | Resp 16 | Ht 67.0 in | Wt 245.0 lb

## 2014-05-18 DIAGNOSIS — I499 Cardiac arrhythmia, unspecified: Secondary | ICD-10-CM

## 2014-05-18 DIAGNOSIS — E119 Type 2 diabetes mellitus without complications: Secondary | ICD-10-CM

## 2014-05-18 DIAGNOSIS — I493 Ventricular premature depolarization: Secondary | ICD-10-CM | POA: Insufficient documentation

## 2014-05-18 DIAGNOSIS — Z23 Encounter for immunization: Secondary | ICD-10-CM

## 2014-05-18 DIAGNOSIS — E785 Hyperlipidemia, unspecified: Secondary | ICD-10-CM

## 2014-05-18 DIAGNOSIS — I10 Essential (primary) hypertension: Secondary | ICD-10-CM

## 2014-05-18 DIAGNOSIS — D751 Secondary polycythemia: Secondary | ICD-10-CM

## 2014-05-18 LAB — LIPID PANEL
Cholesterol: 153 mg/dL (ref 0–200)
HDL: 28 mg/dL — ABNORMAL LOW (ref >39)
LDL Cholesterol: 68 mg/dL (ref 0–99)
TRIGLYCERIDES: 287 mg/dL — AB (ref <150)
Total CHOL/HDL Ratio: 5.5 Ratio
VLDL: 57 mg/dL — AB (ref 0–40)

## 2014-05-18 NOTE — Assessment & Plan Note (Signed)
Discussed importance of her Lipitor at this time I'll recheck her lipids today

## 2014-05-18 NOTE — Progress Notes (Signed)
Patient ID: Katelyn Cline, female   DOB: 06/20/1961, 53 y.o.   MRN: [id number]   Subjective:    Patient ID: Katelyn Cline, female    DOB: 1961-03-24, 53 y.o.   MRN: [id number]  Patient presents for 3 month F/U and Irregular Heartbeats patient follow-up surveillance in 6 weeks as is. She states her blood sugars are from 120-150, if below 120 she feels hypoglycemia, recent A1C was 6.5%  She did have repeat scan for her lung cancer which was showed no recurrence of lung disease however did show coronary atherosclerosis as well as hepatic steatosis.  Last night she noticed her heart rate was scheduled with the Neurontin. She not chest and shortness of breath associated. She decreased Intake. She hasn't always drainage has been using cough drops and otherwise no other over-the-counter medications.    Review Of Systems:  GEN- denies fatigue, fever, weight loss,weakness, recent illness HEENT- denies eye drainage, change in vision, nasal discharge, CVS- denies chest pain, +palpitations RESP- denies SOB, cough, wheeze ABD- denies N/V, change in stools, abd pain GU- denies dysuria, hematuria, dribbling, incontinence MSK- + joint pain, muscle aches, injury Neuro- denies headache, dizziness, syncope, seizure activity       Objective:    BP 130/76 mmHg  Pulse 68  Temp(Src) 98.5 F (36.9 C) (Oral)  Resp 16  Ht 5\' 7"  (1.702 m)  Wt 245 lb (111.131 kg)  BMI 38.36 kg/m2 GEN- NAD, alert and oriented x3 HEENT- PERRL, EOMI, non injected sclera, pink conjunctiva, MMM, oropharynx clear Neck- Supple, no thyromegaly CVS- RRR, no murmur, skipped beat noted, ? PVC RESP-good air movement, no wheeze EXT- No edema Pulses- Radial, DP- 2+   EKG- NSR, PVC     Assessment & Plan:      Problem List Items Addressed This Visit    None      Note: This dictation was prepared with Dragon dictation along with smaller phrase technology. Any transcriptional errors that result from this process are  unintentional.

## 2014-05-18 NOTE — Assessment & Plan Note (Signed)
Well controlled 

## 2014-05-18 NOTE — Assessment & Plan Note (Signed)
Noted on EKG, decrease caffiene use, she states she has been stressed this week

## 2014-05-18 NOTE — Assessment & Plan Note (Signed)
followby oncologyhemoglobin still remains elevated

## 2014-05-18 NOTE — Patient Instructions (Signed)
Continue current medications A1C is 6.5% great job We will call about cholesteorl Flu shot given F/U 3 months

## 2014-05-18 NOTE — Addendum Note (Signed)
Addended by: Sheral Flow on: 05/18/2014 12:28 PM   Modules accepted: Orders

## 2014-05-18 NOTE — Assessment & Plan Note (Signed)
Well controlled, no change to meds A1C 6.5%

## 2014-05-21 ENCOUNTER — Encounter: Payer: Self-pay | Admitting: *Deleted

## 2014-06-14 ENCOUNTER — Telehealth: Payer: Self-pay | Admitting: Family Medicine

## 2014-06-14 ENCOUNTER — Other Ambulatory Visit (HOSPITAL_COMMUNITY): Payer: Commercial Managed Care - HMO

## 2014-06-14 ENCOUNTER — Encounter (HOSPITAL_COMMUNITY): Payer: Commercial Managed Care - HMO

## 2014-06-14 NOTE — Telephone Encounter (Signed)
Patient is calling to get rx written for her percocet   [phone]

## 2014-06-14 NOTE — Telephone Encounter (Signed)
?  ok to refill °

## 2014-06-15 MED ORDER — OXYCODONE-ACETAMINOPHEN 7.5-325 MG PO TABS: 1.0000 | ORAL_TABLET | Freq: Three times a day (TID) | ORAL | Status: DC | PRN

## 2014-06-15 NOTE — Telephone Encounter (Signed)
Ok to refill??  Last office visit 05/18/2014.  Last refill 05/14/2014.

## 2014-06-15 NOTE — Telephone Encounter (Signed)
Okay to refill? 

## 2014-06-15 NOTE — Telephone Encounter (Signed)
Prescription printed and patient made aware to come to office to pick up.  

## 2014-06-29 DIAGNOSIS — C3490 Malignant neoplasm of unspecified part of unspecified bronchus or lung: Secondary | ICD-10-CM | POA: Diagnosis not present

## 2014-07-08 ENCOUNTER — Other Ambulatory Visit: Payer: Self-pay | Admitting: Family Medicine

## 2014-07-10 NOTE — Telephone Encounter (Signed)
Refill appropriate and filled per protocol. 

## 2014-07-16 ENCOUNTER — Telehealth: Payer: Self-pay | Admitting: Family Medicine

## 2014-07-16 MED ORDER — OXYCODONE-ACETAMINOPHEN 7.5-325 MG PO TABS: 1.0000 | ORAL_TABLET | Freq: Three times a day (TID) | ORAL | Status: DC | PRN

## 2014-07-16 NOTE — Telephone Encounter (Signed)
ok 

## 2014-07-16 NOTE — Telephone Encounter (Signed)
Prescription printed and patient made aware to come to office to pick up.  

## 2014-07-16 NOTE — Telephone Encounter (Signed)
Ok to refill??  Last office visit 05/18/2014.  Last refill 06/15/2014.

## 2014-07-16 NOTE — Telephone Encounter (Signed)
Patient calling to get refill on oxycodone  [phone]

## 2014-07-30 DIAGNOSIS — C3490 Malignant neoplasm of unspecified part of unspecified bronchus or lung: Secondary | ICD-10-CM | POA: Diagnosis not present

## 2014-08-10 ENCOUNTER — Encounter (HOSPITAL_COMMUNITY): Payer: Commercial Managed Care - HMO | Attending: Hematology and Oncology | Admitting: Hematology & Oncology

## 2014-08-10 ENCOUNTER — Encounter (HOSPITAL_COMMUNITY): Payer: Self-pay | Admitting: Hematology & Oncology

## 2014-08-10 ENCOUNTER — Encounter (HOSPITAL_BASED_OUTPATIENT_CLINIC_OR_DEPARTMENT_OTHER): Payer: Commercial Managed Care - HMO

## 2014-08-10 VITALS — BP 129/82 | HR 78 | Temp 98.6°F | Resp 20 | Wt 238.2 lb

## 2014-08-10 DIAGNOSIS — C4491 Basal cell carcinoma of skin, unspecified: Secondary | ICD-10-CM

## 2014-08-10 DIAGNOSIS — D751 Secondary polycythemia: Secondary | ICD-10-CM

## 2014-08-10 DIAGNOSIS — C439 Malignant melanoma of skin, unspecified: Secondary | ICD-10-CM

## 2014-08-10 DIAGNOSIS — J9611 Chronic respiratory failure with hypoxia: Secondary | ICD-10-CM

## 2014-08-10 DIAGNOSIS — E119 Type 2 diabetes mellitus without complications: Secondary | ICD-10-CM

## 2014-08-10 DIAGNOSIS — C799 Secondary malignant neoplasm of unspecified site: Secondary | ICD-10-CM

## 2014-08-10 DIAGNOSIS — C7800 Secondary malignant neoplasm of unspecified lung: Secondary | ICD-10-CM

## 2014-08-10 DIAGNOSIS — Z86008 Personal history of in-situ neoplasm of other site: Secondary | ICD-10-CM

## 2014-08-10 DIAGNOSIS — Z9889 Other specified postprocedural states: Secondary | ICD-10-CM | POA: Diagnosis not present

## 2014-08-10 DIAGNOSIS — D45 Polycythemia vera: Secondary | ICD-10-CM

## 2014-08-10 DIAGNOSIS — F329 Major depressive disorder, single episode, unspecified: Secondary | ICD-10-CM

## 2014-08-10 DIAGNOSIS — F32A Depression, unspecified: Secondary | ICD-10-CM

## 2014-08-10 LAB — CBC WITH DIFFERENTIAL/PLATELET
BASOS PCT: 0 % (ref 0–1)
Basophils Absolute: 0.1 10*3/uL (ref 0.0–0.1)
Eosinophils Absolute: 0.2 10*3/uL (ref 0.0–0.7)
Eosinophils Relative: 2 % (ref 0–5)
HEMATOCRIT: 56.6 % — AB (ref 36.0–46.0)
HEMOGLOBIN: 18.7 g/dL — AB (ref 12.0–15.0)
Lymphocytes Relative: 20 % (ref 12–46)
Lymphs Abs: 2.8 10*3/uL (ref 0.7–4.0)
MCH: 29.7 pg (ref 26.0–34.0)
MCHC: 33 g/dL (ref 30.0–36.0)
MCV: 90 fL (ref 78.0–100.0)
MONO ABS: 0.8 10*3/uL (ref 0.1–1.0)
Monocytes Relative: 5 % (ref 3–12)
Neutro Abs: 10.5 10*3/uL — ABNORMAL HIGH (ref 1.7–7.7)
Neutrophils Relative %: 73 % (ref 43–77)
Platelets: 290 10*3/uL (ref 150–400)
RBC: 6.29 MIL/uL — AB (ref 3.87–5.11)
RDW: 16.7 % — ABNORMAL HIGH (ref 11.5–15.5)
WBC: 14.3 10*3/uL — AB (ref 4.0–10.5)

## 2014-08-10 LAB — COMPREHENSIVE METABOLIC PANEL
ALBUMIN: 4.2 g/dL (ref 3.5–5.2)
ALK PHOS: 142 U/L — AB (ref 39–117)
ALT: 20 U/L (ref 0–35)
AST: 16 U/L (ref 0–37)
Anion gap: 7 (ref 5–15)
BUN: 17 mg/dL (ref 6–23)
CO2: 34 mmol/L — ABNORMAL HIGH (ref 19–32)
Calcium: 10.1 mg/dL (ref 8.4–10.5)
Chloride: 98 mmol/L (ref 96–112)
Creatinine, Ser: 0.88 mg/dL (ref 0.50–1.10)
GFR calc non Af Amer: 73 mL/min — ABNORMAL LOW (ref >90)
GFR, EST AFRICAN AMERICAN: 84 mL/min — AB (ref >90)
GLUCOSE: 157 mg/dL — AB (ref 70–99)
Potassium: 3.9 mmol/L (ref 3.5–5.1)
SODIUM: 139 mmol/L (ref 135–145)
Total Bilirubin: 0.6 mg/dL (ref 0.3–1.2)
Total Protein: 8.1 g/dL (ref 6.0–8.3)

## 2014-08-10 LAB — LACTATE DEHYDROGENASE: LDH: 124 U/L (ref 94–250)

## 2014-08-10 NOTE — Patient Instructions (Addendum)
[name] at [facility] Discharge Instructions  RECOMMENDATIONS MADE BY THE CONSULTANT AND ANY TEST RESULTS WILL BE SENT TO YOUR REFERRING PHYSICIAN.  Exam and discussion by Dr. Whitney Muse Scans need to be done now and every 3 months Katelyn Cline will contact you with dates and times for your phlebotomies (4 total) You will need lab work 1 week after your last phlebotomy CT scans next week   Follow-up after repeat CT scans in 3 months.  Thank you for choosing [place] at [facility] to provide your oncology and hematology care.  To afford each patient quality time with our provider, please arrive at least 15 minutes before your scheduled appointment time.    You need to re-schedule your appointment should you arrive 10 or more minutes late.  We strive to give you quality time with our providers, and arriving late affects you and other patients whose appointments are after yours.  Also, if you no show three or more times for appointments you may be dismissed from the clinic at the providers discretion.     Again, thank you for choosing [facility].  Our hope is that these requests will decrease the amount of time that you wait before being seen by our physicians.       _____________________________________________________________  Should you have questions after your visit to [facility], please contact our office at [phone] between the hours of 8:30 a.m. and 4:30 p.m.  Voicemails left after 4:30 p.m. will not be returned until the following business day.  For prescription refill requests, have your pharmacy contact our office.

## 2014-08-10 NOTE — Progress Notes (Signed)
Katelyn Blackbird, MD [address] [place] [address]    DIAGNOSIS: Metastatic melanoma, BRAF mutation negative             Right lung resection by Dr. Servando Snare on 10/21/2012             JAK 2 positive P. Vera   SUMMARY OF ONCOLOGIC HISTORY:   Metastatic melanoma   10/18/2012 Initial Diagnosis Metastatic melanoma    CURRENT THERAPY: Observation        phlebotomy  INTERVAL HISTORY: Katelyn Cline 54 y.o. female returns for follow-up of a history of stage IV melanoma, primary site not identified and polycythemia vera. She has a service dog and he is here with her today. She is due for repeat imaging studies. She has no major complaints. She is not typically very compliant in regards to herpes vera and phlebotomy. She states today she is going to try to be more compliant. She denies headaches, blurry vision, chest pain, or other significant complaints. Last documented mammogram and Epic was in March 2014, last CT imaging in regards to her melanoma was done in November 2015.  MEDICAL HISTORY: Past Medical History  Diagnosis Date  . Narcolepsy   . HTN (hypertension)   . Fibromyalgia   . Scoliosis   . Migraines   . Major depression   . Panic attacks   . Agoraphobia   . Chronic pain   . Miscarriage     x 4  . Peripheral neuropathy   . Hyperlipidemia   . Elevated hemoglobin 2014  . Vertigo   . Polycythemia secondary to smoking   . Dysrhythmia     Hx: of palpitations "a long time ago"  . Shortness of breath     Hx: of with activity  . Sleep apnea   . Basal cell carcinoma   . Polycythemia vera(238.4)   . Metastatic melanoma 10/18/2012    12 mm posterior right upper lobe pulmonary nodule, max SUV 3.0    . Chronic respiratory failure with hypoxia     3L [place]  . Peripheral neuropathy     in feet  . DM type 2 (diabetes mellitus, type 2) 10/16/2013  . History of UTI     has DDD (degenerative disc disease), lumbar; DDD (degenerative disc disease), cervical;  Chronic pain disorder; Depression; Fibromyalgia; Hyperlipidemia; Narcolepsy; Peripheral neuropathy; Tobacco use disorder; Obesity; Essential hypertension, benign; Non-healing skin lesion of nose; Polycythemia; Polycythemia secondary to smoking; Metastatic melanoma; Diabetes mellitus type II, controlled; Chronic hypoxemic respiratory failure; Basal cell carcinoma of skin; Posttraumatic stress disorder; MRSA (methicillin resistant staph aureus) culture positive; Gastroenteritis; PVC (premature ventricular contraction); and Seborrheic keratoses on her problem list.     is allergic to contrast media; erythromycin; flagyl; latex; other; penicillins; and tetracyclines & related.  Ms. Timpone does not currently have medications on file.  SURGICAL HISTORY: Past Surgical History  Procedure Laterality Date  . Vaginal hysterectomy    . Cystectomy      abdominal wall   . Basal cell carcinoma excision      flap surgery on face  . Colonoscopy w/ biopsies and polypectomy      Hx: of  . Dilation and curettage of uterus      Hx: of   . Video assisted thoracoscopy (vats)/wedge resection Right 10/21/2012    Procedure: VIDEO ASSISTED THORACOSCOPY (VATS)/WEDGE RESECTION;  Surgeon: [name], MD;  Location: [place];  Service: Thoracic;  Laterality: Right;  .  Video bronchoscopy N/A 10/21/2012    Procedure: VIDEO BRONCHOSCOPY;  Surgeon: [name], MD;  Location: [facility];  Service: Thoracic;  Laterality: N/A;  . Breast lumpectomy      bilateral    SOCIAL HISTORY: History   Social History  . Marital Status: Divorced    Spouse Name: N/A  . Number of Children: N/A  . Years of Education: 12+   Occupational History  . Not on file.   Social History Main Topics  . Smoking status: Former Smoker -- 2.00 packs/day for 5 years    Types: Cigarettes    Quit date: 09/16/2012  . Smokeless tobacco: Never Used  . Alcohol Use: Yes     Comment: 1 -2 drinks a month, occasional  . Drug Use: No  . Sexual  Activity: Not Currently   Other Topics Concern  . Not on file   Social History Narrative    FAMILY HISTORY: Family History  Problem Relation Age of Onset  . Arthritis Mother   . Hyperlipidemia Mother   . Depression Mother   . Anxiety disorder Mother   . Dementia Mother   . Hypertension Father   . Hyperlipidemia Father   . Heart disease Father   . Stroke Father   . Dementia Father   . Heart disease Brother   . Alcohol abuse Maternal Grandfather   . Bipolar disorder Neg Hx   . Drug abuse Neg Hx   . OCD Neg Hx   . Paranoid behavior Neg Hx   . Schizophrenia Neg Hx   . Seizures Neg Hx   . Sexual abuse Neg Hx   . Physical abuse Neg Hx   . ADD / ADHD Son     Review of Systems  Constitutional: Positive for malaise/fatigue. Negative for fever, chills and weight loss.  HENT: Negative for congestion, hearing loss, nosebleeds, sore throat and tinnitus.   Eyes: Negative for blurred vision, double vision, pain and discharge.  Respiratory: Positive for shortness of breath. Negative for cough, hemoptysis, sputum production and wheezing.   Cardiovascular: Negative for chest pain, palpitations, claudication, leg swelling and PND.  Gastrointestinal: Negative for heartburn, nausea, vomiting, abdominal pain, diarrhea, constipation, blood in stool and melena.  Genitourinary: Positive for urgency and frequency. Negative for dysuria and hematuria.  Musculoskeletal: Positive for joint pain. Negative for myalgias and falls.  Skin: Negative for itching and rash.  Neurological: Positive for weakness. Negative for dizziness, tingling, tremors, sensory change, speech change, focal weakness, seizures, loss of consciousness and headaches.  Endo/Heme/Allergies: Does not bruise/bleed easily.  Psychiatric/Behavioral: Positive for depression. Negative for suicidal ideas, memory loss and substance abuse. The patient is nervous/anxious. The patient does not have insomnia.     PHYSICAL EXAMINATION  ECOG  PERFORMANCE STATUS: 1 - Symptomatic but completely ambulatory  Filed Vitals:   08/10/14 1242  BP: 129/82  Pulse: 78  Temp: 98.6 F (37 C)  Resp: 20    Physical Exam  Constitutional: She is oriented to person, place, and time and well-developed, well-nourished, and in no distress.  OBese  HENT:  Head: Normocephalic and atraumatic.  Nose: Nose normal.  Mouth/Throat: Oropharynx is clear and moist. No oropharyngeal exudate.  Eyes: Conjunctivae and EOM are normal. Pupils are equal, round, and reactive to light. Right eye exhibits no discharge. Left eye exhibits no discharge. No scleral icterus.  Neck: Normal range of motion. Neck supple. No tracheal deviation present. No thyromegaly present.  Cardiovascular: Normal rate, regular rhythm and normal heart sounds.  Exam reveals no gallop and no friction rub.   No murmur heard. Pulmonary/Chest: Effort normal and breath sounds normal. She has no wheezes. She has no rales.  Abdominal: Soft. Bowel sounds are normal. She exhibits no distension and no mass. There is no tenderness. There is no rebound and no guarding.  Exam limited secondary to body habitus  Musculoskeletal: Normal range of motion. She exhibits no edema.  Lymphadenopathy:    She has no cervical adenopathy.  Neurological: She is alert and oriented to person, place, and time. She has normal reflexes. No cranial nerve deficit. Gait normal. Coordination normal.  Skin: Skin is warm and dry. No rash noted.  Psychiatric: Mood, memory, affect and judgment normal.  Nursing note and vitals reviewed.   LABORATORY DATA:  CBC    Component Value Date/Time   WBC 14.3* 08/10/2014 1238   RBC 6.29* 08/10/2014 1238   HGB 18.7* 08/10/2014 1238   HCT 56.6* 08/10/2014 1238   PLT 290 08/10/2014 1238   MCV 90.0 08/10/2014 1238   MCH 29.7 08/10/2014 1238   MCHC 33.0 08/10/2014 1238   RDW 16.7* 08/10/2014 1238   LYMPHSABS 2.8 08/10/2014 1238   MONOABS 0.8 08/10/2014 1238   EOSABS 0.2  08/10/2014 1238   BASOSABS 0.1 08/10/2014 1238   CMP     Component Value Date/Time   NA 139 08/10/2014 1238   K 3.9 08/10/2014 1238   CL 98 08/10/2014 1238   CO2 34* 08/10/2014 1238   GLUCOSE 157* 08/10/2014 1238   BUN 17 08/10/2014 1238   CREATININE 0.88 08/10/2014 1238   CREATININE 0.63 02/14/2014 0954   CALCIUM 10.1 08/10/2014 1238   PROT 8.1 08/10/2014 1238   ALBUMIN 4.2 08/10/2014 1238   AST 16 08/10/2014 1238   ALT 20 08/10/2014 1238   ALKPHOS 142* 08/10/2014 1238   BILITOT 0.6 08/10/2014 1238   GFRNONAA 73* 08/10/2014 1238   GFRAA 84* 08/10/2014 1238      ASSESSMENT and THERAPY PLAN:   JAK 2 positive Polycythemia Vera Secondary Erythrocytosis 54 year old female with polycythemia vera, JAK 2 positive disease, and a component of secondary erythrocytosis based upon her erythropoietin level and known chronic hypoxemic respiratory failure. She is intermittently compliant with phlebotomy. I discussed the risks associated with poorly controlled P vera. She states she is well aware of those risks. She is agreeable to proceed with phlebotomy in order to drop her hematocrit to less than 45. I also recommended closer observation of her counts. Currently she is agreeable. Other options to control her counts may be to consider Hydrea, but we will address this proceeding forward.  MELANOMA She has a history of a right lung nodule and underwent surgical resection. Final pathology showed a metastatic melanoma, [place] after was negative. She has no evidence of recurrent disease but is being followed closely with imaging. We will get her rescheduled for CT restaging. If there is any problems with her scans I advised her I would call. He states she has regular follow-up with a dermatologist. I have advised her to make sure she has ongoing follow-up with him. She does have a history of basal cell carcinoma and had Mohs surgery.   All questions were answered. The patient knows to call the  clinic with any problems, questions or concerns. We can certainly see the patient much sooner if necessary. This note was electronically signed. Molli Hazard 08/31/2014

## 2014-08-10 NOTE — Progress Notes (Signed)
Labs drawn

## 2014-08-14 ENCOUNTER — Encounter: Payer: Self-pay | Admitting: *Deleted

## 2014-08-14 ENCOUNTER — Telehealth: Payer: Self-pay | Admitting: *Deleted

## 2014-08-14 ENCOUNTER — Other Ambulatory Visit (HOSPITAL_COMMUNITY): Payer: Self-pay | Admitting: Oncology

## 2014-08-14 DIAGNOSIS — C799 Secondary malignant neoplasm of unspecified site: Secondary | ICD-10-CM

## 2014-08-14 DIAGNOSIS — C439 Malignant melanoma of skin, unspecified: Secondary | ICD-10-CM

## 2014-08-14 NOTE — Telephone Encounter (Signed)
Received fax from [facility] [employer] care mgmt with authorization number [id number] to Dr. Electa Sniff, MD   Treating provider: [facility]  Requesting provider: Dr. Electa Sniff, MD  Place of service: Acute care hospital  PCP: [name], MD  Type of service: CT  Procedure: 38333-OV abd&pelvis w/o contrast                    71250-Ct thorax w/o dye  Dx: C79.9-Secondary malignant neoplasm of unspecified site  Number of visits:1  Start Date:08/15/14 expires on date: 02/11/15

## 2014-08-14 NOTE — Telephone Encounter (Signed)
This encounter was created in error - please disregard.

## 2014-08-15 ENCOUNTER — Other Ambulatory Visit (HOSPITAL_COMMUNITY): Payer: Commercial Managed Care - HMO

## 2014-08-15 ENCOUNTER — Ambulatory Visit (HOSPITAL_COMMUNITY): Payer: Commercial Managed Care - HMO

## 2014-08-15 ENCOUNTER — Telehealth (HOSPITAL_COMMUNITY): Payer: Self-pay | Admitting: *Deleted

## 2014-08-15 NOTE — Telephone Encounter (Signed)
Have contacted patient once a day via voicemail x 3 days and patient has not called back to schedule TPRs. Patient did call and cancel her CT scan today but did not call me to schedule TPRs.

## 2014-08-17 ENCOUNTER — Telehealth (HOSPITAL_COMMUNITY): Payer: Self-pay | Admitting: *Deleted

## 2014-08-17 ENCOUNTER — Telehealth: Payer: Self-pay | Admitting: *Deleted

## 2014-08-17 NOTE — Telephone Encounter (Signed)
Pt needs refill on Oxycodone 7.5-325mg   Last refill 07/17/14  ?ok to refill

## 2014-08-17 NOTE — Telephone Encounter (Signed)
I have now called patient 4 times to schedule TPR and she doesn't answer. Message left on cell vm. I offered patient via vm [facility] since patient told Radiology the other day that her car broke down. I still can't get in touch with patient. I asked patient to call me back to schedule TPRs.

## 2014-08-17 NOTE — Telephone Encounter (Signed)
Okay to refill? 

## 2014-08-20 ENCOUNTER — Telehealth: Payer: Self-pay | Admitting: Family Medicine

## 2014-08-20 MED ORDER — OXYCODONE-ACETAMINOPHEN 7.5-325 MG PO TABS: 1.0000 | ORAL_TABLET | Freq: Three times a day (TID) | ORAL | Status: DC | PRN

## 2014-08-20 NOTE — Telephone Encounter (Signed)
Script printed,ready for provider signature

## 2014-08-21 ENCOUNTER — Telehealth (HOSPITAL_COMMUNITY): Payer: Self-pay | Admitting: *Deleted

## 2014-08-21 ENCOUNTER — Encounter (HOSPITAL_COMMUNITY): Payer: Self-pay | Admitting: *Deleted

## 2014-08-21 ENCOUNTER — Other Ambulatory Visit (HOSPITAL_COMMUNITY): Payer: Self-pay | Admitting: *Deleted

## 2014-08-21 DIAGNOSIS — I10 Essential (primary) hypertension: Secondary | ICD-10-CM

## 2014-08-21 NOTE — Telephone Encounter (Signed)
Pt notified via voicemail that her TPRs would be moved to Short Stay here @ [facility]. I again let her know that we could probably get her set up with [facility] if she would just let us know that she needed transportation. I have been trying to call her for a week or more now. Her phone goes straight to voicemail. I asked her to call me and let me know that she received this message.

## 2014-08-24 ENCOUNTER — Encounter: Payer: Self-pay | Admitting: Family Medicine

## 2014-08-24 ENCOUNTER — Ambulatory Visit (INDEPENDENT_AMBULATORY_CARE_PROVIDER_SITE_OTHER): Payer: Medicare HMO | Admitting: Family Medicine

## 2014-08-24 VITALS — BP 128/84 | HR 78 | Temp 97.9°F | Resp 19 | Ht 67.0 in | Wt 244.0 lb

## 2014-08-24 DIAGNOSIS — I10 Essential (primary) hypertension: Secondary | ICD-10-CM | POA: Diagnosis not present

## 2014-08-24 DIAGNOSIS — E119 Type 2 diabetes mellitus without complications: Secondary | ICD-10-CM | POA: Diagnosis not present

## 2014-08-24 DIAGNOSIS — E785 Hyperlipidemia, unspecified: Secondary | ICD-10-CM

## 2014-08-24 DIAGNOSIS — J0100 Acute maxillary sinusitis, unspecified: Secondary | ICD-10-CM

## 2014-08-24 DIAGNOSIS — G629 Polyneuropathy, unspecified: Secondary | ICD-10-CM

## 2014-08-24 DIAGNOSIS — L821 Other seborrheic keratosis: Secondary | ICD-10-CM | POA: Diagnosis not present

## 2014-08-24 LAB — HEMOGLOBIN A1C
HEMOGLOBIN A1C: 6.6 % — AB (ref <5.7)
MEAN PLASMA GLUCOSE: 143 mg/dL — AB (ref <117)

## 2014-08-24 LAB — LIPID PANEL
CHOL/HDL RATIO: 5.5 ratio
CHOLESTEROL: 171 mg/dL (ref 0–200)
HDL: 31 mg/dL — AB (ref >=46)
LDL Cholesterol: 91 mg/dL (ref 0–99)
Triglycerides: 243 mg/dL — ABNORMAL HIGH (ref <150)
VLDL: 49 mg/dL — ABNORMAL HIGH (ref 0–40)

## 2014-08-24 MED ORDER — LEVOFLOXACIN 500 MG PO TABS
500.0000 mg | ORAL_TABLET | Freq: Every day | ORAL | Status: DC
Start: 2014-08-24 — End: 2015-01-04

## 2014-08-24 MED ORDER — FLUCONAZOLE 150 MG PO TABS: 150.0000 mg | ORAL_TABLET | Freq: Once | ORAL | Status: DC

## 2014-08-24 NOTE — Progress Notes (Signed)
Patient ID: Katelyn Cline, female   DOB: 03-30-1961, 54 y.o.   MRN: [id number]   Subjective:    Patient ID: Katelyn Cline, female    DOB: [birth year], 54 y.o.   MRN: [id number]  Patient presents for 3 month F/U; Mole to R Shoulder; and Illness  patient here to follow-up chronic medical problems. She's had a lot of difficulty with finances recently. She states that she does have her medications. Her son noticed a small mole on her right shoulder that she wanted me to look at. She's not had any pain or tenderness from the area.  She's been sick with sinus pressure and drainage worsening of the past 3 weeks. She feels pressure into gums like a toothache She has not had any significant fever she had some nausea a few days but no vomiting no diarrhea. She's also had some cough with some mild production. She's not had any wheezing. She's been taking NyQuil and DayQuil since she ran out of Mucinex.  Diabetes mellitus she's been taking her medicine as described. She's not had any hypoglycemia symptoms she does not have her meter with her today. She does notice that her neuropathy is worse at times her feet just burn she does not take her gabapentin on a regular basis  Review Of Systems:  GEN- +fatigue, fever, weight loss,weakness, recent illness HEENT- denies eye drainage, change in vision, +nasal discharge, CVS- denies chest pain, palpitations RESP- denies SOB, +cough, wheeze ABD- denies N/V, change in stools, abd pain GU- denies dysuria, hematuria, dribbling, incontinence MSK- + joint pain, muscle aches, injury Neuro- denies headache, dizziness, syncope, seizure activity       Objective:    BP 128/84 mmHg  Pulse 78  Temp(Src) 97.9 F (36.6 C) (Oral)  Resp 19  Ht 5\' 7"  (1.702 m)  Wt 244 lb (110.678 kg)  BMI 38.21 kg/m2 GEN- NAD, alert and oriented x3 HEENT- PERRL, EOMI, non injected sclera, pink conjunctiva, MMM, oropharynx mild injection, TM clear bilat no effusion,  + maxillary  sinus tenderness, inflammed turbinates,  Nasal drainage  Neck- Supple, shotty anterior  LAD CVS- RRR, no murmur RESP-CTAB EXT- No edema,callus bilat feet Skin- keratosis noted scattered on back, multiple tiny moles/freckles, multiple cherry angiomas on abdomen, back Pulses- Radial 2+         Assessment & Plan:      Problem List Items Addressed This Visit      Unprioritized   Seborrheic keratoses    Benign lesion, multple on back with moles and cherry angiomas      Peripheral neuropathy    Continue gabapentin advised at least 300mg  at bedtime regularly to control symptoms      Hyperlipidemia   Relevant Orders   Lipid panel   Essential hypertension, benign    Blood pressure is well controlled and changed her medication      Diabetes mellitus type II, controlled    Recheck A1c today her diabetes has been well-controlled. She does also have neuropathy Advised to take her gabapentin      Relevant Orders   Hemoglobin A1c    Other Visit Diagnoses    Acute maxillary sinusitis, recurrence not specified    -  Primary    Treat with antibiotics, okay to contimnue dayquil unable to afford any other meds at this time    Relevant Medications    levofloxacin (LEVAQUIN) tablet    fluconazole (DIFLUCAN) tablet 150 mg       Note: This dictation was  prepared with Dragon dictation along with smaller phrase technology. Any transcriptional errors that result from this process are unintentional.

## 2014-08-24 NOTE — Assessment & Plan Note (Signed)
Continue gabapentin advised at least 300mg  at bedtime regularly to control symptoms

## 2014-08-24 NOTE — Assessment & Plan Note (Signed)
Blood pressure is well controlled and changed her medication

## 2014-08-24 NOTE — Patient Instructions (Signed)
Take 300mg  of gabapentin at bedtime at least for your neuropathy  Take the antibiotics  We will call with lab results Diflucan  F/U 4 months

## 2014-08-24 NOTE — Assessment & Plan Note (Signed)
Recheck A1c today her diabetes has been well-controlled. She does also have neuropathy Advised to take her gabapentin

## 2014-08-24 NOTE — Assessment & Plan Note (Addendum)
Benign lesion, multple on back with moles and cherry angiomas

## 2014-08-28 DIAGNOSIS — C3490 Malignant neoplasm of unspecified part of unspecified bronchus or lung: Secondary | ICD-10-CM | POA: Diagnosis not present

## 2014-08-29 ENCOUNTER — Ambulatory Visit (HOSPITAL_COMMUNITY)
Admission: RE | Admit: 2014-08-29 | Discharge: 2014-08-29 | Disposition: A | Discharge status: Home / Self Care | Payer: Commercial Managed Care - HMO | Source: Ambulatory Visit | Attending: Oncology | Admitting: Oncology

## 2014-08-29 ENCOUNTER — Encounter (HOSPITAL_COMMUNITY)
Admission: RE | Admit: 2014-08-29 | Discharge: 2014-08-29 | Disposition: A | Discharge status: Home / Self Care | Payer: Commercial Managed Care - HMO | Source: Ambulatory Visit | Attending: Hematology & Oncology | Admitting: Hematology & Oncology

## 2014-08-29 VITALS — BP 158/80 | HR 74

## 2014-08-29 DIAGNOSIS — Z85820 Personal history of malignant melanoma of skin: Secondary | ICD-10-CM | POA: Insufficient documentation

## 2014-08-29 DIAGNOSIS — I2510 Atherosclerotic heart disease of native coronary artery without angina pectoris: Secondary | ICD-10-CM | POA: Diagnosis not present

## 2014-08-29 DIAGNOSIS — I700 Atherosclerosis of aorta: Secondary | ICD-10-CM | POA: Diagnosis not present

## 2014-08-29 DIAGNOSIS — C7800 Secondary malignant neoplasm of unspecified lung: Secondary | ICD-10-CM | POA: Diagnosis not present

## 2014-08-29 DIAGNOSIS — C799 Secondary malignant neoplasm of unspecified site: Secondary | ICD-10-CM

## 2014-08-29 DIAGNOSIS — I10 Essential (primary) hypertension: Secondary | ICD-10-CM | POA: Insufficient documentation

## 2014-08-29 DIAGNOSIS — C439 Malignant melanoma of skin, unspecified: Secondary | ICD-10-CM

## 2014-08-29 DIAGNOSIS — Z08 Encounter for follow-up examination after completed treatment for malignant neoplasm: Secondary | ICD-10-CM | POA: Insufficient documentation

## 2014-08-29 DIAGNOSIS — K5730 Diverticulosis of large intestine without perforation or abscess without bleeding: Secondary | ICD-10-CM | POA: Diagnosis not present

## 2014-08-29 DIAGNOSIS — K429 Umbilical hernia without obstruction or gangrene: Secondary | ICD-10-CM | POA: Diagnosis not present

## 2014-08-29 NOTE — Progress Notes (Addendum)
Katelyn Cline presents today for phlebotomy per MD orders. HGB/HCT18.7 /56.6% Phlebotomy procedure started at 1415 and ended at 1422. 1 unit PRBC removed. Patient tolerated procedure well. IV needle removed intact.

## 2014-08-30 ENCOUNTER — Encounter: Payer: Self-pay | Admitting: *Deleted

## 2014-08-30 ENCOUNTER — Other Ambulatory Visit (HOSPITAL_COMMUNITY): Payer: Self-pay | Admitting: *Deleted

## 2014-08-30 DIAGNOSIS — C799 Secondary malignant neoplasm of unspecified site: Secondary | ICD-10-CM

## 2014-08-30 DIAGNOSIS — D751 Secondary polycythemia: Secondary | ICD-10-CM

## 2014-08-30 DIAGNOSIS — C439 Malignant melanoma of skin, unspecified: Secondary | ICD-10-CM

## 2014-09-05 ENCOUNTER — Encounter (HOSPITAL_COMMUNITY)
Admission: RE | Admit: 2014-09-05 | Discharge: 2014-09-05 | Disposition: A | Discharge status: Home / Self Care | Payer: Commercial Managed Care - HMO | Source: Ambulatory Visit | Attending: Hematology & Oncology | Admitting: Hematology & Oncology

## 2014-09-05 DIAGNOSIS — I10 Essential (primary) hypertension: Secondary | ICD-10-CM | POA: Diagnosis not present

## 2014-09-05 NOTE — Progress Notes (Signed)
thera phlebotomy ;performed via right ac without difficulty. 1.2 lbs blood obtained.

## 2014-09-07 ENCOUNTER — Encounter (HOSPITAL_COMMUNITY): Payer: Self-pay

## 2014-09-08 ENCOUNTER — Other Ambulatory Visit: Payer: Self-pay | Admitting: Family Medicine

## 2014-09-10 NOTE — Telephone Encounter (Signed)
Medication refilled per protocol. 

## 2014-09-12 ENCOUNTER — Encounter (HOSPITAL_COMMUNITY)
Admission: RE | Admit: 2014-09-12 | Discharge: 2014-09-12 | Disposition: A | Discharge status: Home / Self Care | Payer: Commercial Managed Care - HMO | Source: Ambulatory Visit | Attending: Hematology & Oncology | Admitting: Hematology & Oncology

## 2014-09-12 DIAGNOSIS — I10 Essential (primary) hypertension: Secondary | ICD-10-CM | POA: Diagnosis not present

## 2014-09-12 NOTE — Progress Notes (Signed)
Here for therapeutic phlebotomy.

## 2014-09-12 NOTE — Progress Notes (Signed)
Phlebotomy started via left AC.

## 2014-09-12 NOTE — Progress Notes (Signed)
D/C to home in good condition.

## 2014-09-12 NOTE — Progress Notes (Signed)
Phlebotomy completed. 1 lb 8.1 oz completed. Needle removed. drsg to site. Tolerated well.

## 2014-09-14 ENCOUNTER — Telehealth: Payer: Self-pay | Admitting: Family Medicine

## 2014-09-14 MED ORDER — OXYCODONE-ACETAMINOPHEN 7.5-325 MG PO TABS: 1.0000 | ORAL_TABLET | Freq: Three times a day (TID) | ORAL | Status: DC | PRN

## 2014-09-14 NOTE — Telephone Encounter (Signed)
Patient requesting refill on her pain medication  [phone] when ready

## 2014-09-14 NOTE — Telephone Encounter (Signed)
Ok to refill??  Last office visit 08/24/2014.  Last refill 08/20/2014.

## 2014-09-14 NOTE — Telephone Encounter (Signed)
Okay to refill? 

## 2014-09-14 NOTE — Telephone Encounter (Signed)
Call placed to patient and patient made aware.  

## 2014-09-18 ENCOUNTER — Encounter (HOSPITAL_COMMUNITY): Payer: Commercial Managed Care - HMO

## 2014-09-19 ENCOUNTER — Inpatient Hospital Stay (HOSPITAL_COMMUNITY): Admission: RE | Admit: 2014-09-19 | Payer: Commercial Managed Care - HMO | Source: Ambulatory Visit

## 2014-09-25 ENCOUNTER — Other Ambulatory Visit (HOSPITAL_COMMUNITY): Payer: Commercial Managed Care - HMO

## 2014-09-26 ENCOUNTER — Encounter (HOSPITAL_COMMUNITY)
Admission: RE | Admit: 2014-09-26 | Discharge: 2014-09-26 | Disposition: A | Discharge status: Home / Self Care | Payer: Commercial Managed Care - HMO | Source: Ambulatory Visit | Attending: Hematology & Oncology | Admitting: Hematology & Oncology

## 2014-09-26 DIAGNOSIS — I10 Essential (primary) hypertension: Secondary | ICD-10-CM | POA: Diagnosis not present

## 2014-09-26 NOTE — Progress Notes (Addendum)
Here for therapeutic phlebotomy. Phlebotomy started at 1001 right arm. 1.83 lb of blood obtained. Completed at 1010. Tolerated well.

## 2014-09-28 DIAGNOSIS — C3490 Malignant neoplasm of unspecified part of unspecified bronchus or lung: Secondary | ICD-10-CM | POA: Diagnosis not present

## 2014-10-01 ENCOUNTER — Encounter (HOSPITAL_COMMUNITY): Payer: Commercial Managed Care - HMO | Attending: Oncology

## 2014-10-01 DIAGNOSIS — D751 Secondary polycythemia: Secondary | ICD-10-CM | POA: Insufficient documentation

## 2014-10-01 DIAGNOSIS — C799 Secondary malignant neoplasm of unspecified site: Secondary | ICD-10-CM | POA: Diagnosis not present

## 2014-10-01 DIAGNOSIS — C439 Malignant melanoma of skin, unspecified: Secondary | ICD-10-CM

## 2014-10-01 DIAGNOSIS — E119 Type 2 diabetes mellitus without complications: Secondary | ICD-10-CM | POA: Diagnosis not present

## 2014-10-01 LAB — CBC
HEMATOCRIT: 46.6 % — AB (ref 36.0–46.0)
Hemoglobin: 14.7 g/dL (ref 12.0–15.0)
MCH: 26.9 pg (ref 26.0–34.0)
MCHC: 31.5 g/dL (ref 30.0–36.0)
MCV: 85.2 fL (ref 78.0–100.0)
PLATELETS: 332 10*3/uL (ref 150–400)
RBC: 5.47 MIL/uL — ABNORMAL HIGH (ref 3.87–5.11)
RDW: 16.7 % — ABNORMAL HIGH (ref 11.5–15.5)
WBC: 15 10*3/uL — AB (ref 4.0–10.5)

## 2014-10-01 LAB — FERRITIN: Ferritin: 17 ng/mL (ref 10–291)

## 2014-10-02 NOTE — Progress Notes (Signed)
Labs drawn

## 2014-10-03 ENCOUNTER — Encounter (HOSPITAL_COMMUNITY)
Admission: RE | Admit: 2014-10-03 | Discharge: 2014-10-03 | Disposition: A | Discharge status: Home / Self Care | Payer: Commercial Managed Care - HMO | Source: Ambulatory Visit | Attending: Hematology & Oncology | Admitting: Hematology & Oncology

## 2014-10-03 DIAGNOSIS — D751 Secondary polycythemia: Secondary | ICD-10-CM | POA: Diagnosis not present

## 2014-10-03 NOTE — Progress Notes (Signed)
Katelyn Cline presents today for phlebotomy per MD orders. HGB/HCT Phlebotomy procedure started at 12:34  and ended at 1257  0.75 lbs  removed. Patient tolerated procedure well. IV needle removed intact. Notified cancer center about only removing 0.75lbs ok not to re stick patient per cancer dr.

## 2014-10-10 ENCOUNTER — Telehealth: Payer: Self-pay | Admitting: Family Medicine

## 2014-10-10 MED ORDER — OXYCODONE-ACETAMINOPHEN 7.5-325 MG PO TABS: 1.0000 | ORAL_TABLET | Freq: Three times a day (TID) | ORAL | Status: DC | PRN

## 2014-10-10 NOTE — Telephone Encounter (Signed)
Ok to refill??  Last office visit 08/24/2014.  Last refill 09/14/2014.

## 2014-10-10 NOTE — Telephone Encounter (Signed)
Okay to refill? 

## 2014-10-10 NOTE — Telephone Encounter (Signed)
Prescription printed and patient made aware to come to office to pick up.  

## 2014-10-10 NOTE — Telephone Encounter (Signed)
Patient is calling to get refill on her oxycodone  [phone]

## 2014-10-28 DIAGNOSIS — C3490 Malignant neoplasm of unspecified part of unspecified bronchus or lung: Secondary | ICD-10-CM | POA: Diagnosis not present

## 2014-10-31 ENCOUNTER — Telehealth: Payer: Self-pay | Admitting: *Deleted

## 2014-10-31 NOTE — Telephone Encounter (Signed)
Received fax from [facility] Management with authorization to [place] [facility] at [facility] with authorization number [id number]  Requesting provider: Holley Dexter  Treating provider: [place] [facility] of service: Acute care hospital  PCP: Lonell Grandchild [place],MD  Type of service: CT  Procedures: 71250-CT thorax w/o dye                      74176- CT Abd&pelvis w/o contrast  Number of visits: 1  Start date: 11/08/14  End date: 05/07/15  Dx:C79.9- secondary malignant neoplasm of unspecified site       C44.91- Basal cell carcinoma of skin, unspecified

## 2014-11-07 ENCOUNTER — Telehealth: Payer: Self-pay | Admitting: *Deleted

## 2014-11-07 MED ORDER — OXYCODONE-ACETAMINOPHEN 7.5-325 MG PO TABS: 1.0000 | ORAL_TABLET | Freq: Three times a day (TID) | ORAL | Status: DC | PRN

## 2014-11-07 NOTE — Telephone Encounter (Signed)
Pt calling needing refill on oxycodone 7.5-'325mg'$  and states will pick it up on Friday.   ?ok to refill 10/10/14

## 2014-11-07 NOTE — Telephone Encounter (Signed)
Okay to refill? 

## 2014-11-07 NOTE — Telephone Encounter (Signed)
Script printed,ready for provider signature 

## 2014-11-08 ENCOUNTER — Ambulatory Visit (HOSPITAL_COMMUNITY): Payer: Commercial Managed Care - HMO

## 2014-11-09 ENCOUNTER — Ambulatory Visit (HOSPITAL_COMMUNITY)
Admission: RE | Admit: 2014-11-09 | Discharge: 2014-11-09 | Disposition: A | Discharge status: Home / Self Care | Payer: Commercial Managed Care - HMO | Source: Ambulatory Visit | Attending: Hematology & Oncology | Admitting: Hematology & Oncology

## 2014-11-09 ENCOUNTER — Telehealth: Payer: Self-pay | Admitting: Family Medicine

## 2014-11-09 DIAGNOSIS — C799 Secondary malignant neoplasm of unspecified site: Secondary | ICD-10-CM | POA: Insufficient documentation

## 2014-11-09 DIAGNOSIS — C7800 Secondary malignant neoplasm of unspecified lung: Secondary | ICD-10-CM | POA: Diagnosis not present

## 2014-11-09 DIAGNOSIS — C7802 Secondary malignant neoplasm of left lung: Secondary | ICD-10-CM | POA: Diagnosis not present

## 2014-11-09 DIAGNOSIS — C7801 Secondary malignant neoplasm of right lung: Secondary | ICD-10-CM | POA: Diagnosis not present

## 2014-11-09 DIAGNOSIS — C439 Malignant melanoma of skin, unspecified: Secondary | ICD-10-CM | POA: Diagnosis not present

## 2014-11-12 ENCOUNTER — Ambulatory Visit (HOSPITAL_COMMUNITY): Payer: Commercial Managed Care - HMO | Admitting: Hematology & Oncology

## 2014-11-12 ENCOUNTER — Telehealth: Payer: Self-pay | Admitting: *Deleted

## 2014-11-12 ENCOUNTER — Encounter (HOSPITAL_COMMUNITY): Payer: Commercial Managed Care - HMO | Attending: Hematology and Oncology | Admitting: Hematology & Oncology

## 2014-11-12 ENCOUNTER — Encounter (HOSPITAL_COMMUNITY): Payer: Self-pay | Admitting: Hematology & Oncology

## 2014-11-12 VITALS — BP 139/83 | HR 79 | Temp 98.2°F | Resp 18 | Wt 242.4 lb

## 2014-11-12 DIAGNOSIS — D751 Secondary polycythemia: Secondary | ICD-10-CM | POA: Diagnosis not present

## 2014-11-12 DIAGNOSIS — E119 Type 2 diabetes mellitus without complications: Secondary | ICD-10-CM | POA: Diagnosis not present

## 2014-11-12 DIAGNOSIS — C799 Secondary malignant neoplasm of unspecified site: Secondary | ICD-10-CM | POA: Insufficient documentation

## 2014-11-12 DIAGNOSIS — Z85820 Personal history of malignant melanoma of skin: Secondary | ICD-10-CM

## 2014-11-12 DIAGNOSIS — R0902 Hypoxemia: Secondary | ICD-10-CM | POA: Diagnosis not present

## 2014-11-12 DIAGNOSIS — Z139 Encounter for screening, unspecified: Secondary | ICD-10-CM

## 2014-11-12 DIAGNOSIS — D45 Polycythemia vera: Secondary | ICD-10-CM | POA: Diagnosis not present

## 2014-11-12 LAB — CBC WITH DIFFERENTIAL/PLATELET
BASOS ABS: 0.1 10*3/uL (ref 0.0–0.1)
Basophils Relative: 1 % (ref 0–1)
Eosinophils Absolute: 0.3 10*3/uL (ref 0.0–0.7)
Eosinophils Relative: 2 % (ref 0–5)
HEMATOCRIT: 50.2 % — AB (ref 36.0–46.0)
Hemoglobin: 15 g/dL (ref 12.0–15.0)
LYMPHS ABS: 2.8 10*3/uL (ref 0.7–4.0)
LYMPHS PCT: 20 % (ref 12–46)
MCH: 23.9 pg — ABNORMAL LOW (ref 26.0–34.0)
MCHC: 29.9 g/dL — AB (ref 30.0–36.0)
MCV: 80.1 fL (ref 78.0–100.0)
MONO ABS: 0.6 10*3/uL (ref 0.1–1.0)
Monocytes Relative: 4 % (ref 3–12)
NEUTROS ABS: 10 10*3/uL — AB (ref 1.7–7.7)
Neutrophils Relative %: 73 % (ref 43–77)
Platelets: 379 10*3/uL (ref 150–400)
RBC: 6.27 MIL/uL — ABNORMAL HIGH (ref 3.87–5.11)
RDW: 18 % — ABNORMAL HIGH (ref 11.5–15.5)
WBC: 13.7 10*3/uL — ABNORMAL HIGH (ref 4.0–10.5)

## 2014-11-12 LAB — COMPREHENSIVE METABOLIC PANEL
ALT: 20 U/L (ref 14–54)
AST: 22 U/L (ref 15–41)
Albumin: 4.1 g/dL (ref 3.5–5.0)
Alkaline Phosphatase: 137 U/L — ABNORMAL HIGH (ref 38–126)
Anion gap: 10 (ref 5–15)
BUN: 12 mg/dL (ref 6–20)
CO2: 30 mmol/L (ref 22–32)
Calcium: 9.5 mg/dL (ref 8.9–10.3)
Chloride: 102 mmol/L (ref 101–111)
Creatinine, Ser: 0.61 mg/dL (ref 0.44–1.00)
GLUCOSE: 118 mg/dL — AB (ref 65–99)
Potassium: 4.7 mmol/L (ref 3.5–5.1)
Sodium: 142 mmol/L (ref 135–145)
Total Bilirubin: 0.5 mg/dL (ref 0.3–1.2)
Total Protein: 7.9 g/dL (ref 6.5–8.1)

## 2014-11-12 NOTE — Telephone Encounter (Signed)
Received a fax from [facility] care management with authorization for CT scan at [facility] with authorization number [id number]  Requesting provider: Holley Dexter  Treating provider: [facility]  Place of service: acute care hospital  PCP: Lonell Grandchild [place],MD  Type of service: CT  Procedures: 71250-Ct Thorax w/o Dye                      74176-Ct abd&pelvis w/o dy  Number of visits:1  Start Date: 11/08/14  End Date: 05/07/15  Dx:C79.9-secondary malignant neoplasm of unspecified site      C44.91- Basal Cell carcinoma of skin,unspecified

## 2014-11-12 NOTE — Progress Notes (Signed)
Katelyn Blackbird, MD [address] [place] [address]    DIAGNOSIS: Metastatic melanoma, BRAF mutation negative             Right lung resection by Dr. Servando Snare on 10/21/2012             JAK 2 positive P. Vera                 SUMMARY OF ONCOLOGIC HISTORY:   Metastatic melanoma   10/18/2012 Initial Diagnosis Metastatic melanoma    CURRENT THERAPY: Observation        Phlebotomy   INTERVAL HISTORY: Katelyn Cline 54 y.o. female returns for follow-up of a history of stage IV melanoma, primary site not identified and polycythemia vera. She has a service dog and he is here with her today.She has no major complaints. She denies headaches, blurry vision, chest pain, or other significant complaints. Last documented mammogram and Epic was in March 2014. She notes she has not had a screening colonoscopy in the past "10 years." She is here to review recent CT imaging studies.  She has been working outside in her garden and walks her dog every evening. She has noticed a palpable abnormality on the interior left thigh. It does not hurt.  She says she smokes "occasionally".  She doesn't currently have a dermotologist. Only saw one when they found her melanoma.  MEDICAL HISTORY: Past Medical History  Diagnosis Date  . Narcolepsy   . HTN (hypertension)   . Fibromyalgia   . Scoliosis   . Migraines   . Major depression   . Panic attacks   . Agoraphobia   . Chronic pain   . Miscarriage     x 4  . Peripheral neuropathy   . Hyperlipidemia   . Elevated hemoglobin 2014  . Vertigo   . Polycythemia secondary to smoking   . Dysrhythmia     Hx: of palpitations "a long time ago"  . Shortness of breath     Hx: of with activity  . Sleep apnea   . Basal cell carcinoma   . Polycythemia vera(238.4)   . Metastatic melanoma 10/18/2012    12 mm posterior right upper lobe pulmonary nodule, max SUV 3.0    . Chronic respiratory failure with hypoxia     3L [place]  . Peripheral neuropathy       in feet  . DM type 2 (diabetes mellitus, type 2) 10/16/2013  . History of UTI     has DDD (degenerative disc disease), lumbar; DDD (degenerative disc disease), cervical; Chronic pain disorder; Depression; Fibromyalgia; Hyperlipidemia; Narcolepsy; Peripheral neuropathy; Tobacco use disorder; Obesity; Essential hypertension, benign; Non-healing skin lesion of nose; Polycythemia; Polycythemia secondary to smoking; Metastatic melanoma; Diabetes mellitus type II, controlled; Chronic hypoxemic respiratory failure; Basal cell carcinoma of skin; Posttraumatic stress disorder; MRSA (methicillin resistant staph aureus) culture positive; Gastroenteritis; PVC (premature ventricular contraction); and Seborrheic keratoses on her problem list.     is allergic to contrast media; erythromycin; flagyl; latex; other; penicillins; and tetracyclines & related.  Katelyn Cline does not currently have medications on file.  SURGICAL HISTORY: Past Surgical History  Procedure Laterality Date  . Vaginal hysterectomy    . Cystectomy      abdominal wall   . Basal cell carcinoma excision      flap surgery on face  . Colonoscopy w/ biopsies and polypectomy      Hx: of  . Dilation and curettage of uterus  Hx: of   . Video assisted thoracoscopy (vats)/wedge resection Right 10/21/2012    Procedure: VIDEO ASSISTED THORACOSCOPY (VATS)/WEDGE RESECTION;  Surgeon: [name], MD;  Location: [place];  Service: Thoracic;  Laterality: Right;  . Video bronchoscopy N/A 10/21/2012    Procedure: VIDEO BRONCHOSCOPY;  Surgeon: [name], MD;  Location: [facility] OR;  Service: Thoracic;  Laterality: N/A;  . Breast lumpectomy      bilateral    SOCIAL HISTORY: History   Social History  . Marital Status: Divorced    Spouse Name: N/A  . Number of Children: N/A  . Years of Education: 12+   Occupational History  . Not on file.   Social History Main Topics  . Smoking status: Former Smoker -- 2.00 packs/day for 5 years     Types: Cigarettes    Quit date: 09/16/2012  . Smokeless tobacco: Never Used  . Alcohol Use: Yes     Comment: 1 -2 drinks a month, occasional  . Drug Use: No  . Sexual Activity: Not Currently   Other Topics Concern  . Not on file   Social History Narrative    FAMILY HISTORY: Family History  Problem Relation Age of Onset  . Arthritis Mother   . Hyperlipidemia Mother   . Depression Mother   . Anxiety disorder Mother   . Dementia Mother   . Hypertension Father   . Hyperlipidemia Father   . Heart disease Father   . Stroke Father   . Dementia Father   . Heart disease Brother   . Alcohol abuse Maternal Grandfather   . Bipolar disorder Neg Hx   . Drug abuse Neg Hx   . OCD Neg Hx   . Paranoid behavior Neg Hx   . Schizophrenia Neg Hx   . Seizures Neg Hx   . Sexual abuse Neg Hx   . Physical abuse Neg Hx   . ADD / ADHD Son     Review of Systems  Constitutional: Positive for weight gain. Negative for fever, and chills.  HENT: Negative for congestion, hearing loss, nosebleeds, sore throat and tinnitus.   Eyes: Negative for blurred vision, double vision, pain and discharge.  Respiratory: Negative for cough, hemoptysis, sputum production and wheezing.   Cardiovascular: Negative for chest pain, palpitations, claudication, leg swelling and PND.  Gastrointestinal: Negative for heartburn, nausea, vomiting, abdominal pain, diarrhea, constipation, blood in stool and melena.  Genitourinary: Negative for dysuria and hematuria.  Musculoskeletal: Positive for joint pain. Negative for myalgias and falls.  Skin: Negative for itching and rash.  Neurological: Negative for dizziness, tingling, tremors, sensory change, speech change, focal weakness, seizures, loss of consciousness and headaches.  Endo/Heme/Allergies: Does not bruise/bleed easily.  Psychiatric/Behavioral: Negative for suicidal ideas, memory loss and substance abuse. The patient does not have insomnia.     PHYSICAL  EXAMINATION ECOG PERFORMANCE STATUS: 1 - Symptomatic but completely ambulatory  Filed Vitals:   11/12/14 1258  BP: 139/83  Pulse: 79  Temp: 98.2 F (36.8 C)  Resp: 18    Physical Exam  Constitutional: She is oriented to person, place, and time and well-developed, well-nourished, and in no distress.      Obese  HENT:  Head: Normocephalic and atraumatic.  Nose: Nose normal.  Mouth/Throat: Oropharynx is clear and moist. No oropharyngeal exudate.  Eyes: Conjunctivae and EOM are normal. Pupils are equal, round, and reactive to light. Right eye exhibits no discharge. Left eye exhibits no discharge. No scleral icterus.  Neck: Normal range of  motion. Neck supple. No tracheal deviation present. No thyromegaly present.  Cardiovascular: Normal rate, regular rhythm and normal heart sounds.  Exam reveals no gallop and no friction rub.   No murmur heard. Pulmonary/Chest: Effort normal and breath sounds normal. She has no wheezes. She has no rales.  Abdominal: Soft. Bowel sounds are normal. She exhibits no distension and no mass. There is no tenderness. There is no rebound and no guarding.     Exam limited secondary to body habitus  Musculoskeletal: Normal range of motion. She exhibits no edema.      Left upper thigh distinct palpable mobile mass, approximately 2 cm in largest diameter.  Lymphadenopathy:    She has no cervical adenopathy.  Neurological: She is alert and oriented to person, place, and time. She has normal reflexes. No cranial nerve deficit. Gait normal. Coordination normal.  Skin: Skin is warm and dry. No rash noted.  Psychiatric: Mood, memory, affect and judgment normal.  Nursing note and vitals reviewed.   LABORATORY DATA:  CBC    Component Value Date/Time   WBC 13.7* 11/12/2014 1400   RBC 6.27* 11/12/2014 1400   HGB 15.0 11/12/2014 1400   HCT 50.2* 11/12/2014 1400   PLT 379 11/12/2014 1400   MCV 80.1 11/12/2014 1400   MCH 23.9* 11/12/2014 1400   MCHC 29.9*  11/12/2014 1400   RDW 18.0* 11/12/2014 1400   LYMPHSABS 2.8 11/12/2014 1400   MONOABS 0.6 11/12/2014 1400   EOSABS 0.3 11/12/2014 1400   BASOSABS 0.1 11/12/2014 1400   CMP     Component Value Date/Time   NA 142 11/12/2014 1400   K 4.7 11/12/2014 1400   CL 102 11/12/2014 1400   CO2 30 11/12/2014 1400   GLUCOSE 118* 11/12/2014 1400   BUN 12 11/12/2014 1400   CREATININE 0.61 11/12/2014 1400   CREATININE 0.63 02/14/2014 0954   CALCIUM 9.5 11/12/2014 1400   PROT 7.9 11/12/2014 1400   ALBUMIN 4.1 11/12/2014 1400   AST 22 11/12/2014 1400   ALT 20 11/12/2014 1400   ALKPHOS 137* 11/12/2014 1400   BILITOT 0.5 11/12/2014 1400   GFRNONAA >60 11/12/2014 1400   GFRAA >60 11/12/2014 1400   RADIOLOGY: I have reviewed the images below and agree with the report  CLINICAL DATA: Follow up stage IV metastatic melanoma. Metastatic disease to the lungs.  EXAM: CT CHEST, ABDOMEN AND PELVIS WITHOUT CONTRAST  TECHNIQUE: Multidetector CT imaging of the chest, abdomen and pelvis was performed following the standard protocol without IV contrast.  COMPARISON: Multiple exams, including 08/29/2014  FINDINGS: CT CHEST FINDINGS IMPRESSION: 1. Small left lung nodules stable from 2014. Postoperative findings in the right upper lobe. 2. Atherosclerosis.   Electronically Signed  By: Van Clines M.D.  On: 11/09/2014 10:01  ASSESSMENT and THERAPY PLAN:   JAK 2 positive Polycythemia Vera Secondary Erythrocytosis  54 year old female with polycythemia vera, JAK 2 positive disease, and a component of secondary erythrocytosis based upon her erythropoietin level and known chronic hypoxemic respiratory failure.Based upon counts today she will need phlebotomized. We have discussed the need to maintain her HCT less than 45.  We have discussed hydrea in the past and she will consider this as well.   MELANOMA She has a history of a right lung nodule and underwent surgical resection.  Final pathology showed a metastatic melanoma.  She has no evidence of recurrent disease but is being followed closely with imaging. We reviewed her studies today.   At her last visit she agreed to re-establish  with a dermatologist. She has a history of basal cell carcinoma as well. If she has not re-established by her next follow-up we will refer her. Given her history I am going to refer her to Dr. Arnoldo Morale for the palpable leg nodule. (lipoma?)  Refer for a screening colonoscopy. Refer for screening mammogram.  All questions were answered. The patient knows to call the clinic with any problems, questions or concerns. We can certainly see the patient much sooner if necessary. This note was electronically signed.  This document serves as a record of services personally performed by Ancil Linsey, MD. It was created on her behalf by Arlyce Harman, a trained medical scribe. The creation of this record is based on the scribe's personal observations and the provider's statements to them. This document has been checked and approved by the attending provider.  I have reviewed the above documentation for accuracy and completeness, and I agree with the above. Molli Hazard, MD

## 2014-11-12 NOTE — Patient Instructions (Signed)
..  [place] at [facility] Discharge Instructions  RECOMMENDATIONS MADE BY THE CONSULTANT AND ANY TEST RESULTS WILL BE SENT TO YOUR REFERRING PHYSICIAN.  You will be referred for mammogram, colonoscopy, and appt with Dr. Arnoldo Morale for evaluation of nodule in L inner thigh  Thank you for choosing [place] at [facility] to provide your oncology and hematology care.  To afford each patient quality time with our provider, please arrive at least 15 minutes before your scheduled appointment time.    You need to re-schedule your appointment should you arrive 10 or more minutes late.  We strive to give you quality time with our providers, and arriving late affects you and other patients whose appointments are after yours.  Also, if you no show three or more times for appointments you may be dismissed from the clinic at the providers discretion.     Again, thank you for choosing [facility].  Our hope is that these requests will decrease the amount of time that you wait before being seen by our physicians.       _____________________________________________________________  Should you have questions after your visit to [facility], please contact our office at [phone] between the hours of 8:30 a.m. and 4:30 p.m.  Voicemails left after 4:30 p.m. will not be returned until the following business day.  For prescription refill requests, have your pharmacy contact our office.

## 2014-11-14 ENCOUNTER — Encounter (HOSPITAL_COMMUNITY): Payer: Self-pay | Admitting: Lab

## 2014-11-14 ENCOUNTER — Telehealth (HOSPITAL_COMMUNITY): Payer: Self-pay | Admitting: *Deleted

## 2014-11-14 DIAGNOSIS — D751 Secondary polycythemia: Secondary | ICD-10-CM

## 2014-11-14 NOTE — Progress Notes (Signed)
Referral sent to Dr Arnoldo Morale.  Records faxed on 5/18

## 2014-11-14 NOTE — Telephone Encounter (Signed)
Message left for Marshella to expect call from day surgery to schedule phlebotomy x 2.

## 2014-11-19 ENCOUNTER — Encounter (HOSPITAL_COMMUNITY)
Admission: RE | Admit: 2014-11-19 | Discharge: 2014-11-19 | Disposition: A | Discharge status: Home / Self Care | Payer: Commercial Managed Care - HMO | Source: Ambulatory Visit | Attending: Hematology & Oncology | Admitting: Hematology & Oncology

## 2014-11-19 ENCOUNTER — Encounter (HOSPITAL_COMMUNITY): Payer: Self-pay

## 2014-11-19 VITALS — BP 156/72 | HR 83 | Temp 98.4°F | Resp 18

## 2014-11-19 DIAGNOSIS — D751 Secondary polycythemia: Secondary | ICD-10-CM

## 2014-11-27 ENCOUNTER — Encounter (HOSPITAL_COMMUNITY)
Admission: RE | Admit: 2014-11-27 | Discharge: 2014-11-27 | Disposition: A | Discharge status: Home / Self Care | Payer: Commercial Managed Care - HMO | Source: Ambulatory Visit | Attending: Hematology & Oncology | Admitting: Hematology & Oncology

## 2014-11-27 ENCOUNTER — Telehealth: Payer: Self-pay | Admitting: *Deleted

## 2014-11-27 DIAGNOSIS — D751 Secondary polycythemia: Secondary | ICD-10-CM | POA: Diagnosis not present

## 2014-11-27 NOTE — Progress Notes (Signed)
Katelyn Cline presents today for phlebotomy per MD orders. HGB/HCT done 11/12/14 in [facility]: 15.0/50.2% Phlebotomy procedure started at 1319 and ended at 1326. 1 unit removed. Patient tolerated procedure well. IV needle removed intact. 2 of 2 ordered therapeutic phlebotomies/

## 2014-11-27 NOTE — Telephone Encounter (Signed)
Submitted [facility] referral thru [facility] connect for authorization to Dr. Aviva Signs MD General surgery with authorization number [id number]  Requesting provider: Neysa Hotter  Treating provider: Rosalita Levan  Number of visits:6  Start Date:11/22/14  End Date:05/21/15  Dx:C79.9-Secondary malignant neoplasm of unspecified site      R22.42- Localized swelling, mass and lump, left lower limb  Copy has been faxed to [facility] for review/record

## 2014-11-28 DIAGNOSIS — C3490 Malignant neoplasm of unspecified part of unspecified bronchus or lung: Secondary | ICD-10-CM | POA: Diagnosis not present

## 2014-11-30 ENCOUNTER — Other Ambulatory Visit (HOSPITAL_COMMUNITY): Payer: Self-pay | Admitting: Hematology & Oncology

## 2014-11-30 DIAGNOSIS — Z09 Encounter for follow-up examination after completed treatment for conditions other than malignant neoplasm: Secondary | ICD-10-CM

## 2014-12-06 ENCOUNTER — Telehealth: Payer: Self-pay | Admitting: Family Medicine

## 2014-12-06 NOTE — Telephone Encounter (Signed)
Ok to refill??  Last office visit 08/24/2014.  Last refill 11/07/2014.

## 2014-12-06 NOTE — Telephone Encounter (Signed)
Patient calling to get rx for her oxycodone  [phone]

## 2014-12-07 ENCOUNTER — Ambulatory Visit (HOSPITAL_COMMUNITY): Payer: Self-pay

## 2014-12-07 MED ORDER — OXYCODONE-ACETAMINOPHEN 7.5-325 MG PO TABS: 1.0000 | ORAL_TABLET | Freq: Three times a day (TID) | ORAL | Status: DC | PRN

## 2014-12-07 NOTE — Telephone Encounter (Signed)
Okay to refill? 

## 2014-12-07 NOTE — Telephone Encounter (Signed)
Prescription printed and patient made aware to come to office to pick up on 12/07/2014 after 2pm.

## 2014-12-24 ENCOUNTER — Ambulatory Visit: Payer: Commercial Managed Care - HMO | Admitting: Family Medicine

## 2014-12-25 ENCOUNTER — Ambulatory Visit (HOSPITAL_COMMUNITY)
Admission: RE | Admit: 2014-12-25 | Discharge: 2014-12-25 | Disposition: A | Discharge status: Home / Self Care | Payer: Commercial Managed Care - HMO | Source: Ambulatory Visit | Attending: Hematology & Oncology | Admitting: Hematology & Oncology

## 2014-12-25 ENCOUNTER — Other Ambulatory Visit (HOSPITAL_COMMUNITY): Payer: Self-pay | Admitting: Hematology & Oncology

## 2014-12-25 DIAGNOSIS — Z09 Encounter for follow-up examination after completed treatment for conditions other than malignant neoplasm: Secondary | ICD-10-CM | POA: Insufficient documentation

## 2014-12-25 DIAGNOSIS — N63 Unspecified lump in breast: Secondary | ICD-10-CM | POA: Insufficient documentation

## 2014-12-25 DIAGNOSIS — N6310 Unspecified lump in the right breast, unspecified quadrant: Secondary | ICD-10-CM

## 2014-12-27 ENCOUNTER — Other Ambulatory Visit: Payer: Self-pay | Admitting: Family Medicine

## 2014-12-28 DIAGNOSIS — C3490 Malignant neoplasm of unspecified part of unspecified bronchus or lung: Secondary | ICD-10-CM | POA: Diagnosis not present

## 2014-12-28 NOTE — Telephone Encounter (Signed)
Medication refilled per protocol. 

## 2015-01-02 ENCOUNTER — Ambulatory Visit: Payer: Self-pay | Admitting: Family Medicine

## 2015-01-04 ENCOUNTER — Encounter: Payer: Self-pay | Admitting: Family Medicine

## 2015-01-04 ENCOUNTER — Ambulatory Visit (INDEPENDENT_AMBULATORY_CARE_PROVIDER_SITE_OTHER): Payer: Commercial Managed Care - HMO | Admitting: Family Medicine

## 2015-01-04 VITALS — BP 116/66 | HR 68 | Temp 98.3°F | Resp 14 | Ht 67.0 in | Wt 236.0 lb

## 2015-01-04 DIAGNOSIS — R5383 Other fatigue: Secondary | ICD-10-CM | POA: Diagnosis not present

## 2015-01-04 DIAGNOSIS — E669 Obesity, unspecified: Secondary | ICD-10-CM

## 2015-01-04 DIAGNOSIS — F4310 Post-traumatic stress disorder, unspecified: Secondary | ICD-10-CM

## 2015-01-04 DIAGNOSIS — E119 Type 2 diabetes mellitus without complications: Secondary | ICD-10-CM

## 2015-01-04 DIAGNOSIS — H6091 Unspecified otitis externa, right ear: Secondary | ICD-10-CM

## 2015-01-04 DIAGNOSIS — H6090 Unspecified otitis externa, unspecified ear: Secondary | ICD-10-CM | POA: Insufficient documentation

## 2015-01-04 DIAGNOSIS — G629 Polyneuropathy, unspecified: Secondary | ICD-10-CM | POA: Diagnosis not present

## 2015-01-04 DIAGNOSIS — E785 Hyperlipidemia, unspecified: Secondary | ICD-10-CM

## 2015-01-04 DIAGNOSIS — I10 Essential (primary) hypertension: Secondary | ICD-10-CM

## 2015-01-04 LAB — HEMOGLOBIN A1C
Hgb A1c MFr Bld: 6.7 % — ABNORMAL HIGH (ref <5.7)
MEAN PLASMA GLUCOSE: 146 mg/dL — AB (ref <117)

## 2015-01-04 LAB — TSH: TSH: 2.188 u[IU]/mL (ref 0.350–4.500)

## 2015-01-04 LAB — CBC WITH DIFFERENTIAL/PLATELET
BASOS ABS: 0 10*3/uL (ref 0.0–0.1)
BASOS PCT: 0 % (ref 0–1)
Eosinophils Absolute: 0.3 10*3/uL (ref 0.0–0.7)
Eosinophils Relative: 2 % (ref 0–5)
HCT: 42.6 % (ref 36.0–46.0)
Hemoglobin: 13.2 g/dL (ref 12.0–15.0)
Lymphocytes Relative: 19 % (ref 12–46)
Lymphs Abs: 2.6 10*3/uL (ref 0.7–4.0)
MCH: 22.1 pg — ABNORMAL LOW (ref 26.0–34.0)
MCHC: 31 g/dL (ref 30.0–36.0)
MCV: 71.4 fL — ABNORMAL LOW (ref 78.0–100.0)
MONOS PCT: 4 % (ref 3–12)
MPV: 9.4 fL (ref 8.6–12.4)
Monocytes Absolute: 0.6 10*3/uL (ref 0.1–1.0)
NEUTROS ABS: 10.4 10*3/uL — AB (ref 1.7–7.7)
Neutrophils Relative %: 75 % (ref 43–77)
Platelets: 383 10*3/uL (ref 150–400)
RBC: 5.97 MIL/uL — AB (ref 3.87–5.11)
RDW: 20 % — ABNORMAL HIGH (ref 11.5–15.5)
WBC: 13.8 10*3/uL — AB (ref 4.0–10.5)

## 2015-01-04 LAB — COMPREHENSIVE METABOLIC PANEL
ALT: 10 U/L (ref 0–35)
AST: 12 U/L (ref 0–37)
Albumin: 4 g/dL (ref 3.5–5.2)
Alkaline Phosphatase: 136 U/L — ABNORMAL HIGH (ref 39–117)
BILIRUBIN TOTAL: 0.3 mg/dL (ref 0.2–1.2)
BUN: 20 mg/dL (ref 6–23)
CO2: 26 meq/L (ref 19–32)
Calcium: 9.5 mg/dL (ref 8.4–10.5)
Chloride: 101 mEq/L (ref 96–112)
Creat: 0.68 mg/dL (ref 0.50–1.10)
Glucose, Bld: 132 mg/dL — ABNORMAL HIGH (ref 70–99)
Potassium: 4.5 mEq/L (ref 3.5–5.3)
SODIUM: 141 meq/L (ref 135–145)
TOTAL PROTEIN: 6.9 g/dL (ref 6.0–8.3)

## 2015-01-04 LAB — LIPID PANEL
Cholesterol: 177 mg/dL (ref 0–200)
HDL: 27 mg/dL — ABNORMAL LOW (ref >=46)
LDL Cholesterol: 85 mg/dL (ref 0–99)
Total CHOL/HDL Ratio: 6.6 Ratio
Triglycerides: 325 mg/dL — ABNORMAL HIGH (ref <150)
VLDL: 65 mg/dL — ABNORMAL HIGH (ref 0–40)

## 2015-01-04 MED ORDER — OXYCODONE-ACETAMINOPHEN 7.5-325 MG PO TABS: 1.0000 | ORAL_TABLET | Freq: Three times a day (TID) | ORAL | Status: DC | PRN

## 2015-01-04 MED ORDER — NEOMYCIN-POLYMYXIN-HC 3.5-10000-1 OT SOLN: 3.0000 [drp] | Freq: Four times a day (QID) | OTIC | Status: DC

## 2015-01-04 NOTE — Patient Instructions (Signed)
Continue current medications We will call with lab results Use claritin for allergies Use drip for right ear  F/U 4 months for Physical

## 2015-01-04 NOTE — Assessment & Plan Note (Signed)
Check A1C, may need to decrease AM MTF dose or place on extended release, her symptoms of fatigue do not quite add up as typical side effects of this medication

## 2015-01-04 NOTE — Assessment & Plan Note (Signed)
Cortisporin ear drop given

## 2015-01-04 NOTE — Assessment & Plan Note (Signed)
Well controlled 

## 2015-01-04 NOTE — Assessment & Plan Note (Signed)
Doing well on cymbalta

## 2015-01-04 NOTE — Progress Notes (Signed)
Patient ID: Katelyn Cline, female   DOB: 04-15-1961, 54 y.o.   MRN: [id number]   Subjective:    Patient ID: Katelyn Cline, female    DOB: Nov 19, 1960, 54 y.o.   MRN: [id number]  Patient presents for 4 month F/U and Sinus Infection  Pt here with pressure and pain in right ear, feels like it is swollen, used perioxide in ear, has had some nasal drainage and sneezing from allergies,  Uses dayquil as this is readily available at her home. No fever, no change in breathing.  DM- she is not checking CBG, only taking Metformin at bedtime, states during the morning it makes her feels tired all the time, no diarrhea, no nausea associated.   Has nodule on left leg being evaluated by general surgery in a few weeks, she was referred by oncology.  She is trying to be more active but still feels tired all the time, has had meds most of the time since last visit   Request refill on pain meds   Review Of Systems:  GEN- + fatigue, fever, weight loss,weakness, recent illness HEENT- denies eye drainage, change in vision, +nasal discharge, CVS- denies chest pain, palpitations RESP- denies SOB, cough, wheeze ABD- denies N/V, change in stools, abd pain GU- denies dysuria, hematuria, dribbling, incontinence MSK- +joint pain, muscle aches, injury Neuro- denies headache, dizziness, syncope, seizure activity       Objective:    BP 116/66 mmHg  Pulse 68  Temp(Src) 98.3 F (36.8 C) (Oral)  Resp 14  Ht '5\' 7"'$  (1.702 m)  Wt 236 lb (107.049 kg)  BMI 36.95 kg/m2 GEN- NAD, alert and oriented x3 HEENT- PERRL, EOMI, non injected sclera, pink conjunctiva, MMM, oropharynx clear, narea clear,Bilat TM clear no effusion, right canal erythematous with mild swelling, Pain with manipulation of pinna, left canal clear  Neck- Supple, no thyromegaly CVS- RRR, no murmur RESP-CTAB EXT- No edema Pulses- Radial, DP- 2+        Assessment & Plan:      Problem List Items Addressed This Visit    Peripheral  neuropathy   Hyperlipidemia   Relevant Orders   Lipid panel   Essential hypertension, benign - Primary   Diabetes mellitus type II, controlled   Relevant Orders   CBC with Differential/Platelet   Comprehensive metabolic panel   Hemoglobin A1c    Other Visit Diagnoses    Other fatigue        Relevant Orders    TSH       Note: This dictation was prepared with Dragon dictation along with smaller phrase technology. Any transcriptional errors that result from this process are unintentional.

## 2015-01-04 NOTE — Assessment & Plan Note (Signed)
Continue neurontin and cymbalta

## 2015-01-07 ENCOUNTER — Encounter: Payer: Self-pay | Admitting: *Deleted

## 2015-01-17 DIAGNOSIS — D492 Neoplasm of unspecified behavior of bone, soft tissue, and skin: Secondary | ICD-10-CM | POA: Diagnosis not present

## 2015-01-28 DIAGNOSIS — C3490 Malignant neoplasm of unspecified part of unspecified bronchus or lung: Secondary | ICD-10-CM | POA: Diagnosis not present

## 2015-01-29 ENCOUNTER — Telehealth: Payer: Self-pay | Admitting: Family Medicine

## 2015-01-29 MED ORDER — OXYCODONE-ACETAMINOPHEN 7.5-325 MG PO TABS: 1.0000 | ORAL_TABLET | Freq: Three times a day (TID) | ORAL | Status: DC | PRN

## 2015-01-29 NOTE — Telephone Encounter (Signed)
Ok to refill??  Last office visit/ refill 01/04/2015.

## 2015-01-29 NOTE — Telephone Encounter (Signed)
Okay to refill? 

## 2015-01-29 NOTE — Telephone Encounter (Signed)
Prescription printed and patient made aware to come to office to pick up on 01/30/2015.

## 2015-01-29 NOTE — Telephone Encounter (Signed)
Patient is calling to get rx for her oxycodone  [phone] when ready

## 2015-02-12 ENCOUNTER — Ambulatory Visit (HOSPITAL_COMMUNITY): Payer: Self-pay | Admitting: Oncology

## 2015-02-12 ENCOUNTER — Other Ambulatory Visit (HOSPITAL_COMMUNITY): Payer: Self-pay

## 2015-02-14 ENCOUNTER — Ambulatory Visit (HOSPITAL_COMMUNITY): Payer: Self-pay | Admitting: Oncology

## 2015-02-14 ENCOUNTER — Other Ambulatory Visit (HOSPITAL_COMMUNITY): Payer: Self-pay

## 2015-02-26 ENCOUNTER — Telehealth: Payer: Self-pay | Admitting: Family Medicine

## 2015-02-26 MED ORDER — OXYCODONE-ACETAMINOPHEN 7.5-325 MG PO TABS: 1.0000 | ORAL_TABLET | Freq: Three times a day (TID) | ORAL | Status: DC | PRN

## 2015-02-26 NOTE — Telephone Encounter (Signed)
Ok to refill??  Last office visit 01/04/2015.  Last refill 01/29/2015.

## 2015-02-26 NOTE — Telephone Encounter (Signed)
Patient calling for rx for her percocet [phone] when ready

## 2015-02-26 NOTE — Telephone Encounter (Signed)
Okay to refill? 

## 2015-02-26 NOTE — Telephone Encounter (Signed)
Prescription printed and patient made aware to come to office to pick up after 2pm on 02/26/2015.

## 2015-02-28 ENCOUNTER — Other Ambulatory Visit: Payer: Self-pay | Admitting: Family Medicine

## 2015-02-28 DIAGNOSIS — C3490 Malignant neoplasm of unspecified part of unspecified bronchus or lung: Secondary | ICD-10-CM | POA: Diagnosis not present

## 2015-03-08 ENCOUNTER — Encounter (HOSPITAL_BASED_OUTPATIENT_CLINIC_OR_DEPARTMENT_OTHER): Payer: Commercial Managed Care - HMO

## 2015-03-08 ENCOUNTER — Encounter (HOSPITAL_COMMUNITY): Payer: Commercial Managed Care - HMO | Attending: Hematology & Oncology | Admitting: Oncology

## 2015-03-08 VITALS — BP 143/77 | HR 86 | Temp 98.6°F | Resp 20 | Wt 239.8 lb

## 2015-03-08 DIAGNOSIS — D45 Polycythemia vera: Secondary | ICD-10-CM | POA: Diagnosis not present

## 2015-03-08 DIAGNOSIS — C799 Secondary malignant neoplasm of unspecified site: Secondary | ICD-10-CM | POA: Diagnosis not present

## 2015-03-08 DIAGNOSIS — D751 Secondary polycythemia: Secondary | ICD-10-CM | POA: Diagnosis not present

## 2015-03-08 DIAGNOSIS — C439 Malignant melanoma of skin, unspecified: Secondary | ICD-10-CM

## 2015-03-08 LAB — CBC WITH DIFFERENTIAL/PLATELET
BASOS ABS: 0.1 10*3/uL (ref 0.0–0.1)
BASOS PCT: 0 % (ref 0–1)
Eosinophils Absolute: 0.4 10*3/uL (ref 0.0–0.7)
Eosinophils Relative: 2 % (ref 0–5)
HEMATOCRIT: 46.7 % — AB (ref 36.0–46.0)
HEMOGLOBIN: 13.8 g/dL (ref 12.0–15.0)
Lymphocytes Relative: 13 % (ref 12–46)
Lymphs Abs: 2.3 10*3/uL (ref 0.7–4.0)
MCH: 22 pg — ABNORMAL LOW (ref 26.0–34.0)
MCHC: 29.6 g/dL — AB (ref 30.0–36.0)
MCV: 74.4 fL — ABNORMAL LOW (ref 78.0–100.0)
MONOS PCT: 5 % (ref 3–12)
Monocytes Absolute: 0.9 10*3/uL (ref 0.1–1.0)
Neutro Abs: 14.2 10*3/uL — ABNORMAL HIGH (ref 1.7–7.7)
Neutrophils Relative %: 80 % — ABNORMAL HIGH (ref 43–77)
Platelets: 376 10*3/uL (ref 150–400)
RBC: 6.28 MIL/uL — ABNORMAL HIGH (ref 3.87–5.11)
RDW: 21.1 % — ABNORMAL HIGH (ref 11.5–15.5)
WBC: 17.8 10*3/uL — ABNORMAL HIGH (ref 4.0–10.5)

## 2015-03-08 LAB — COMPREHENSIVE METABOLIC PANEL
ALK PHOS: 131 U/L — AB (ref 38–126)
ALT: 14 U/L (ref 14–54)
AST: 17 U/L (ref 15–41)
Albumin: 3.8 g/dL (ref 3.5–5.0)
Anion gap: 7 (ref 5–15)
BILIRUBIN TOTAL: 0.5 mg/dL (ref 0.3–1.2)
BUN: 13 mg/dL (ref 6–20)
CALCIUM: 9.3 mg/dL (ref 8.9–10.3)
CO2: 31 mmol/L (ref 22–32)
CREATININE: 0.61 mg/dL (ref 0.44–1.00)
Chloride: 101 mmol/L (ref 101–111)
GFR calc Af Amer: >60 mL/min (ref >60)
GFR calc non Af Amer: >60 mL/min (ref >60)
GLUCOSE: 148 mg/dL — AB (ref 65–99)
Potassium: 4.5 mmol/L (ref 3.5–5.1)
Sodium: 139 mmol/L (ref 135–145)
TOTAL PROTEIN: 7.5 g/dL (ref 6.5–8.1)

## 2015-03-08 NOTE — Progress Notes (Signed)
Katelyn Blackbird, MD [address] [place] [address]  Metastatic melanoma - Plan: CT Chest Wo Contrast, CBC with Differential, Comprehensive metabolic panel  Polycythemia vera - Plan: CBC, CBC with Differential, Comprehensive metabolic panel  CURRENT THERAPY: Surveillance and therapeutic phlebotomies PRN.  INTERVAL HISTORY: Katelyn Cline 54 y.o. female returns for followup of Stage IV melanoma, primary site not identified, S/P resection of pulmonary nodule by Dr. Servando Snare on 10/21/2012 with close surveillance since without recurrence in the setting of JAK2 positive polycythemia Vera.    Metastatic melanoma   10/18/2012 Initial Diagnosis Metastatic melanoma    I personally reviewed and went over laboratory results with the patient.  The results are noted within this dictation.  I personally reviewed and went over radiographic studies with the patient.  The results are noted within this dictation.  Mammogram on 12/25/2014 was BIRADS 2.  According to her May 2016 appointment in the clinic, Katelyn Cline was referred for screening colonoscopy and I do not see where she has had colonoscopy.  She notes that she is unable to see GI due to a $200 co-pay.  Additionally, she was to re-establish herself with dermatology for follow-up, she has not yet done this.  She is advised to do so.    Past Medical History  Diagnosis Date  . Narcolepsy   . HTN (hypertension)   . Fibromyalgia   . Scoliosis   . Migraines   . Major depression   . Panic attacks   . Agoraphobia   . Chronic pain   . Miscarriage     x 4  . Peripheral neuropathy   . Hyperlipidemia   . Elevated hemoglobin 2014  . Vertigo   . Polycythemia secondary to smoking   . Dysrhythmia     Hx: of palpitations "a long time ago"  . Shortness of breath     Hx: of with activity  . Sleep apnea   . Basal cell carcinoma   . Polycythemia vera(238.4)   . Metastatic melanoma 10/18/2012    12 mm posterior right upper lobe  pulmonary nodule, max SUV 3.0    . Chronic respiratory failure with hypoxia     3L [place]  . Peripheral neuropathy     in feet  . DM type 2 (diabetes mellitus, type 2) 10/16/2013  . History of UTI     has DDD (degenerative disc disease), lumbar; DDD (degenerative disc disease), cervical; Chronic pain disorder; Depression; Fibromyalgia; Hyperlipidemia; Narcolepsy; Peripheral neuropathy; Tobacco use disorder; Obesity; Essential hypertension, benign; Non-healing skin lesion of nose; Polycythemia vera; Polycythemia secondary to smoking; Metastatic melanoma; Diabetes mellitus type II, controlled; Chronic hypoxemic respiratory failure; Basal cell carcinoma of skin; Posttraumatic stress disorder; MRSA (methicillin resistant staph aureus) culture positive; Gastroenteritis; PVC (premature ventricular contraction); Seborrheic keratoses; and OE (otitis externa) on her problem list.     is allergic to contrast media; erythromycin; flagyl; latex; other; penicillins; and tetracyclines & related.  Current Outpatient Prescriptions on File Prior to Visit  Medication Sig Dispense Refill  . amLODipine (NORVASC) 5 MG tablet TAKE 1 TABLET BY MOUTH EVERY DAY 30 tablet 3  . aspirin-acetaminophen-caffeine (EXCEDRIN MIGRAINE) 885-027-74 MG per tablet Take 1 tablet by mouth every 6 (six) hours as needed for pain.    Marland Kitchen atorvastatin (LIPITOR) 40 MG tablet TAKE 1 TABLET BY MOUTH EVERY DAY 90 tablet 0  . BAYER CONTOUR NEXT TEST test strip     . Cholecalciferol (VITAMIN D) 2000 UNITS  CAPS Take 1 capsule by mouth daily.    . DULoxetine (CYMBALTA) 60 MG capsule TAKE 2 CAPSULES BY MOUTH EVERY DAY 60 capsule 0  . fish oil-omega-3 fatty acids 1000 MG capsule Take 2 g by mouth 2 (two) times daily.    Marland Kitchen gabapentin (NEURONTIN) 100 MG capsule Take 100-300 mg by mouth 3 (three) times daily as needed (Nerve Pain).    Marland Kitchen ibuprofen (ADVIL,MOTRIN) 800 MG tablet TAKE 1 TABLET BY MOUTH EVERY 8 HOURS AS NEEDED 30 tablet 0  . lisinopril  (PRINIVIL,ZESTRIL) 2.5 MG tablet TAKE 1 TABLET BY MOUTH EVERY DAY 30 tablet 3  . metFORMIN (GLUCOPHAGE) 1000 MG tablet TAKE 1 TABLET BY MOUTH TWICE DAILY WITH A MEAL 60 tablet 0  . MICROLET LANCETS MISC     . neomycin-polymyxin-hydrocortisone (CORTISPORIN) otic solution Place 3 drops into the right ear 4 (four) times daily. For 5 days 10 mL 0  . oxyCODONE-acetaminophen (PERCOCET) 7.5-325 MG per tablet Take 1 tablet by mouth 3 (three) times daily as needed. 90 tablet 0  . triamterene-hydrochlorothiazide (DYAZIDE) 37.5-25 MG per capsule TAKE ONE CAPSULE BY MOUTH EVERY MORNING 30 capsule 3   No current facility-administered medications on file prior to visit.    Past Surgical History  Procedure Laterality Date  . Vaginal hysterectomy    . Cystectomy      abdominal wall   . Basal cell carcinoma excision      flap surgery on face  . Colonoscopy w/ biopsies and polypectomy      Hx: of  . Dilation and curettage of uterus      Hx: of   . Video assisted thoracoscopy (vats)/wedge resection Right 10/21/2012    Procedure: VIDEO ASSISTED THORACOSCOPY (VATS)/WEDGE RESECTION;  Surgeon: [name], MD;  Location: [facility];  Service: Thoracic;  Laterality: Right;  . Video bronchoscopy N/A 10/21/2012    Procedure: VIDEO BRONCHOSCOPY;  Surgeon: [name], MD;  Location: [facility] OR;  Service: Thoracic;  Laterality: N/A;  . Breast lumpectomy      bilateral    Denies any headaches, dizziness, double vision, fevers, chills, night sweats, nausea, vomiting, diarrhea, constipation, chest pain, heart palpitations, shortness of breath, blood in stool, black tarry stool, urinary pain, urinary burning, urinary frequency, hematuria.   PHYSICAL EXAMINATION  ECOG PERFORMANCE STATUS: 1 - Symptomatic but completely ambulatory  Filed Vitals:   03/08/15 1000  BP: 143/77  Pulse: 86  Temp: 98.6 F (37 C)  Resp: 20    GENERAL:alert, no distress, well nourished, well developed, comfortable, cooperative,  obese, smiling and accompanied by her service dog Katelyn Cline SKIN: skin color, texture, turgor are normal, no rashes or significant lesions HEAD: Normocephalic, No masses, lesions, tenderness or abnormalities EYES: normal, PERRLA, EOMI, Conjunctiva are pink and non-injected EARS: External ears normal OROPHARYNX:lips, buccal mucosa, and tongue normal and mucous membranes are moist  NECK: supple, no adenopathy, thyroid normal size, non-tender, without nodularity, no stridor, non-tender, trachea midline LYMPH:  no palpable lymphadenopathy, no hepatosplenomegaly BREAST:not examined LUNGS: clear to auscultation  HEART: regular rate & rhythm, no murmurs, no gallops, S1 normal and S2 normal ABDOMEN:abdomen soft, non-tender and normal bowel sounds BACK: Back symmetric, no curvature., No CVA tenderness EXTREMITIES:less then 2 second capillary refill, no joint deformities, effusion, or inflammation, no edema, no skin discoloration, no clubbing, no cyanosis  NEURO: alert & oriented x 3 with fluent speech, no focal motor/sensory deficits, gait normal   LABORATORY DATA: CBC    Component Value Date/Time   WBC  17.8* 03/08/2015 1050   RBC 6.28* 03/08/2015 1050   HGB 13.8 03/08/2015 1050   HCT 46.7* 03/08/2015 1050   PLT 376 03/08/2015 1050   MCV 74.4* 03/08/2015 1050   MCH 22.0* 03/08/2015 1050   MCHC 29.6* 03/08/2015 1050   RDW 21.1* 03/08/2015 1050   LYMPHSABS 2.3 03/08/2015 1050   MONOABS 0.9 03/08/2015 1050   EOSABS 0.4 03/08/2015 1050   BASOSABS 0.1 03/08/2015 1050      Chemistry      Component Value Date/Time   NA 139 03/08/2015 1050   K 4.5 03/08/2015 1050   CL 101 03/08/2015 1050   CO2 31 03/08/2015 1050   BUN 13 03/08/2015 1050   CREATININE 0.61 03/08/2015 1050   CREATININE 0.68 01/04/2015 1028      Component Value Date/Time   CALCIUM 9.3 03/08/2015 1050   ALKPHOS 131* 03/08/2015 1050   AST 17 03/08/2015 1050   ALT 14 03/08/2015 1050   BILITOT 0.5 03/08/2015 1050         PENDING LABS:   RADIOGRAPHIC STUDIES:  No results found.   PATHOLOGY:    ASSESSMENT AND PLAN:  Metastatic melanoma Stage IV melanoma, primary site not identified, S/P resection of pulmonary nodule by Dr. Servando Snare on 10/21/2012 with close surveillance since without recurrence.  Last CT imaging shows NED.  CT chest wo contrast in November 2016.  Mammogram completed and she will be due for her next screening mammogram in June 2017.  According to her May 2016 appointment in the clinic, Katelyn Cline was referred for screening colonoscopy and I do not see where she has had colonoscopy.  She notes that she is unable to see GI due to a $200 co-pay.  Additionally, she was to re-establish herself with dermatology for follow-up, she has not yet done this.  She is advised to do so.  Return in 3- 4 months for follow-up.  Polycythemia vera JAK2 positive, polycythemia vera.  Managed with therapeutic phlebotomies.  Complicated by hypoxemia secondary to tobacco abuse exacerbating.  Update labs today: CBC diff, CMET  Smoking cessation education provided.    THERAPY PLAN:  Continue with therapeutic phlebotomies to manage Polycythemia Vera and close surveillance for metastatic melanoma with CT imaging every 6 months, sooner depending on clinic situation.  All questions were answered. The patient knows to call the clinic with any problems, questions or concerns. We can certainly see the patient much sooner if necessary.  Patient and plan discussed with Dr. Ancil Linsey and she is in agreement with the aforementioned.   This note is electronically signed by: Doy Mince 03/08/2015 4:39 PM

## 2015-03-08 NOTE — Assessment & Plan Note (Addendum)
Stage IV melanoma, primary site not identified, S/P resection of pulmonary nodule by Dr. Servando Snare on 10/21/2012 with close surveillance since without recurrence.  Last CT imaging shows NED.  CT chest wo contrast in November 2016.  Mammogram completed and she will be due for her next screening mammogram in June 2017.  According to her May 2016 appointment in the clinic, Katelyn Cline was referred for screening colonoscopy and I do not see where she has had colonoscopy.  She notes that she is unable to see GI due to a $200 co-pay.  Additionally, she was to re-establish herself with dermatology for follow-up, she has not yet done this.  She is advised to do so.  Return in 3- 4 months for follow-up.

## 2015-03-08 NOTE — Progress Notes (Signed)
LABS DRAWN

## 2015-03-08 NOTE — Patient Instructions (Signed)
[place] at [facility] Discharge Instructions  RECOMMENDATIONS MADE BY THE CONSULTANT AND ANY TEST RESULTS WILL BE SENT TO YOUR REFERRING PHYSICIAN.  Lab work today. We will call you with any abnormal results. Lab work again in 6 weeks and 12 weeks. CT scan in November. Follow up in 3 months with MD. Return as scheduled.  Thank you for choosing [place] at [facility] to provide your oncology and hematology care.  To afford each patient quality time with our provider, please arrive at least 15 minutes before your scheduled appointment time.    You need to re-schedule your appointment should you arrive 10 or more minutes late.  We strive to give you quality time with our providers, and arriving late affects you and other patients whose appointments are after yours.  Also, if you no show three or more times for appointments you may be dismissed from the clinic at the providers discretion.     Again, thank you for choosing [facility].  Our hope is that these requests will decrease the amount of time that you wait before being seen by our physicians.       _____________________________________________________________  Should you have questions after your visit to [facility], please contact our office at [phone] between the hours of 8:30 a.m. and 4:30 p.m.  Voicemails left after 4:30 p.m. will not be returned until the following business day.  For prescription refill requests, have your pharmacy contact our office.

## 2015-03-08 NOTE — Assessment & Plan Note (Addendum)
JAK2 positive, polycythemia vera.  Managed with therapeutic phlebotomies.  Complicated by hypoxemia secondary to tobacco abuse exacerbating.  Update labs today: CBC diff, CMET  Smoking cessation education provided.

## 2015-03-29 ENCOUNTER — Telehealth: Payer: Self-pay | Admitting: Family Medicine

## 2015-03-29 MED ORDER — OXYCODONE-ACETAMINOPHEN 7.5-325 MG PO TABS: 1.0000 | ORAL_TABLET | Freq: Three times a day (TID) | ORAL | Status: DC | PRN

## 2015-03-29 NOTE — Telephone Encounter (Signed)
Ok to refill??  Last office visit 01/04/2015.  Last refill 02/26/2015.

## 2015-03-29 NOTE — Telephone Encounter (Signed)
Patient called requesting refill on her oxyCODONE-acetaminophen (PERCOCET) 7.5-325 MG

## 2015-03-29 NOTE — Telephone Encounter (Signed)
okay

## 2015-03-29 NOTE — Telephone Encounter (Signed)
Patient is aware that prescription is ready for pick up.

## 2015-03-30 DIAGNOSIS — C3490 Malignant neoplasm of unspecified part of unspecified bronchus or lung: Secondary | ICD-10-CM | POA: Diagnosis not present

## 2015-04-19 ENCOUNTER — Other Ambulatory Visit (HOSPITAL_COMMUNITY): Payer: Self-pay

## 2015-04-25 ENCOUNTER — Encounter (HOSPITAL_COMMUNITY): Payer: Self-pay

## 2015-04-26 ENCOUNTER — Telehealth: Payer: Self-pay | Admitting: Family Medicine

## 2015-04-26 MED ORDER — OXYCODONE-ACETAMINOPHEN 7.5-325 MG PO TABS: 1.0000 | ORAL_TABLET | Freq: Three times a day (TID) | ORAL | Status: DC | PRN

## 2015-04-26 NOTE — Telephone Encounter (Signed)
Okay to refill? 

## 2015-04-26 NOTE — Telephone Encounter (Signed)
Ok to refill??  Last office visit 01/04/2015.  Last refill 03/29/2015.

## 2015-04-26 NOTE — Telephone Encounter (Signed)
PHARMACY: SELF PICK UP   MEDICATION: PERCOCET   QTY:    SIG:    PHYSICIAN:    PT. PHONE #: [phone]

## 2015-04-26 NOTE — Telephone Encounter (Signed)
Prescription printed and patient made aware to come to office to pick up on 04/26/2015 after 4pm.

## 2015-04-30 DIAGNOSIS — C3490 Malignant neoplasm of unspecified part of unspecified bronchus or lung: Secondary | ICD-10-CM | POA: Diagnosis not present

## 2015-05-06 ENCOUNTER — Other Ambulatory Visit: Payer: Self-pay | Admitting: Family Medicine

## 2015-05-06 NOTE — Telephone Encounter (Signed)
Refill appropriate and filled per protocol. 

## 2015-05-13 ENCOUNTER — Encounter (HOSPITAL_COMMUNITY): Payer: Commercial Managed Care - HMO | Attending: Hematology & Oncology

## 2015-05-13 DIAGNOSIS — D45 Polycythemia vera: Secondary | ICD-10-CM

## 2015-05-13 DIAGNOSIS — D751 Secondary polycythemia: Secondary | ICD-10-CM | POA: Insufficient documentation

## 2015-05-13 LAB — CBC
HEMATOCRIT: 46.8 % — AB (ref 36.0–46.0)
HEMOGLOBIN: 14.2 g/dL (ref 12.0–15.0)
MCH: 22.6 pg — AB (ref 26.0–34.0)
MCHC: 30.3 g/dL (ref 30.0–36.0)
MCV: 74.4 fL — AB (ref 78.0–100.0)
Platelets: 409 10*3/uL — ABNORMAL HIGH (ref 150–400)
RBC: 6.29 MIL/uL — AB (ref 3.87–5.11)
RDW: 23.3 % — ABNORMAL HIGH (ref 11.5–15.5)
WBC: 13.4 10*3/uL — ABNORMAL HIGH (ref 4.0–10.5)

## 2015-05-13 NOTE — Progress Notes (Signed)
Labs drawn

## 2015-05-16 ENCOUNTER — Other Ambulatory Visit (HOSPITAL_COMMUNITY): Payer: Commercial Managed Care - HMO

## 2015-05-16 ENCOUNTER — Ambulatory Visit (HOSPITAL_COMMUNITY)
Admission: RE | Admit: 2015-05-16 | Discharge: 2015-05-16 | Disposition: A | Discharge status: Home / Self Care | Payer: Commercial Managed Care - HMO | Source: Ambulatory Visit | Attending: Oncology | Admitting: Oncology

## 2015-05-16 DIAGNOSIS — Z08 Encounter for follow-up examination after completed treatment for malignant neoplasm: Secondary | ICD-10-CM | POA: Diagnosis not present

## 2015-05-16 DIAGNOSIS — R590 Localized enlarged lymph nodes: Secondary | ICD-10-CM | POA: Diagnosis not present

## 2015-05-16 DIAGNOSIS — R918 Other nonspecific abnormal finding of lung field: Secondary | ICD-10-CM | POA: Diagnosis not present

## 2015-05-16 DIAGNOSIS — C439 Malignant melanoma of skin, unspecified: Secondary | ICD-10-CM | POA: Insufficient documentation

## 2015-05-16 DIAGNOSIS — C799 Secondary malignant neoplasm of unspecified site: Secondary | ICD-10-CM

## 2015-05-16 DIAGNOSIS — C7802 Secondary malignant neoplasm of left lung: Secondary | ICD-10-CM | POA: Insufficient documentation

## 2015-05-16 DIAGNOSIS — I2510 Atherosclerotic heart disease of native coronary artery without angina pectoris: Secondary | ICD-10-CM | POA: Insufficient documentation

## 2015-05-24 ENCOUNTER — Encounter: Payer: Self-pay | Admitting: Family Medicine

## 2015-05-28 ENCOUNTER — Other Ambulatory Visit: Payer: Self-pay | Admitting: Family Medicine

## 2015-05-28 ENCOUNTER — Ambulatory Visit (INDEPENDENT_AMBULATORY_CARE_PROVIDER_SITE_OTHER): Payer: Commercial Managed Care - HMO | Admitting: Family Medicine

## 2015-05-28 ENCOUNTER — Encounter: Payer: Self-pay | Admitting: Family Medicine

## 2015-05-28 VITALS — BP 138/74 | HR 82 | Temp 98.3°F | Resp 14 | Ht 67.0 in | Wt 244.0 lb

## 2015-05-28 DIAGNOSIS — E785 Hyperlipidemia, unspecified: Secondary | ICD-10-CM | POA: Diagnosis not present

## 2015-05-28 DIAGNOSIS — I10 Essential (primary) hypertension: Secondary | ICD-10-CM | POA: Diagnosis not present

## 2015-05-28 DIAGNOSIS — Z0000 Encounter for general adult medical examination without abnormal findings: Secondary | ICD-10-CM | POA: Diagnosis not present

## 2015-05-28 DIAGNOSIS — F329 Major depressive disorder, single episode, unspecified: Secondary | ICD-10-CM

## 2015-05-28 DIAGNOSIS — K5901 Slow transit constipation: Secondary | ICD-10-CM

## 2015-05-28 DIAGNOSIS — F17200 Nicotine dependence, unspecified, uncomplicated: Secondary | ICD-10-CM | POA: Diagnosis not present

## 2015-05-28 DIAGNOSIS — M797 Fibromyalgia: Secondary | ICD-10-CM | POA: Diagnosis not present

## 2015-05-28 DIAGNOSIS — Z23 Encounter for immunization: Secondary | ICD-10-CM

## 2015-05-28 DIAGNOSIS — K5900 Constipation, unspecified: Secondary | ICD-10-CM | POA: Insufficient documentation

## 2015-05-28 DIAGNOSIS — R946 Abnormal results of thyroid function studies: Secondary | ICD-10-CM | POA: Diagnosis not present

## 2015-05-28 DIAGNOSIS — F32A Depression, unspecified: Secondary | ICD-10-CM

## 2015-05-28 DIAGNOSIS — E119 Type 2 diabetes mellitus without complications: Secondary | ICD-10-CM | POA: Diagnosis not present

## 2015-05-28 LAB — HEMOGLOBIN A1C
HEMOGLOBIN A1C: 6.9 % — AB (ref <5.7)
MEAN PLASMA GLUCOSE: 151 mg/dL — AB (ref <117)

## 2015-05-28 MED ORDER — OXYCODONE-ACETAMINOPHEN 7.5-325 MG PO TABS: 1.0000 | ORAL_TABLET | Freq: Three times a day (TID) | ORAL | Status: DC | PRN

## 2015-05-28 MED ORDER — NALOXEGOL OXALATE 25 MG PO TABS: 25.0000 mg | ORAL_TABLET | Freq: Every day | ORAL | Status: DC

## 2015-05-28 NOTE — Assessment & Plan Note (Signed)
Check A1c fasting lipid panel.

## 2015-05-28 NOTE — Assessment & Plan Note (Signed)
Mention at the end of the visit. She likely has opioid-induced constipation. I have given her Movantik to try

## 2015-05-28 NOTE — Progress Notes (Signed)
Patient ID: Katelyn Cline, female   DOB: 1960/07/12, 54 y.o.   MRN: [id number] Subjective:   Patient presents for Medicare Annual/Subsequent preventive examination.  A here for a wellness exam. A medication review she has not been taking her gabapentin. She states they have some financial problems. But her fibromyalgia is worse and she also gets some tingling and numbness in her feet. She is taking her other medications on a fairly regular basis.  She is still being followed by oncology for her polycythemia vera. Her recent hemoglobins have been normal. She has cut back significantly on smoking. Review Past Medical/Family/Social: Per EMR   Risk Factors  Current exercise habits: None Dietary issues discussed: Yes  Cardiac risk factors: Obesity (BMI >= 30 kg/m2). HTN, DM, hyperlipidemia  Depression Screen --- CURRENT TREATMET (Note: if answer to either of the following is "Yes", a more complete depression screening is indicated)  Over the past two weeks, have you felt down, depressed or hopeless? No Over the past two weeks, have you felt little interest or pleasure in doing things? No Have you lost interest or pleasure in daily life? No Do you often feel hopeless? No Do you cry easily over simple problems? No   Activities of Daily Living  In your present state of health, do you have any difficulty performing the following activities?:  Driving? No  Managing money? No  Feeding yourself? No  Getting from bed to chair? Some Climbing a flight of stairs? SOME Preparing food and eating?: SOME Bathing or showering? No  Getting dressed: No  Getting to the toilet? No  Using the toilet:No  Moving around from place to place: No  In the past year have you fallen or had a near fall?:No  Are you sexually active? No  Do you have more than one partner? No   Hearing Difficulties: No  Do you often ask people to speak up or repeat themselves? YES Do you experience ringing or noises in your  ears? No Do you have difficulty understanding soft or whispered voices? YES Do you feel that you have a problem with memory? YES Do you often misplace items? No  Do you feel safe at home? Yes  Cognitive Testing  Alert? Yes Normal Appearance?Yes  Oriented to person? Yes Place? Yes  Time? Yes  Recall of three objects? Yes  Can perform simple calculations? Yes  Displays appropriate judgment?Yes  Can read the correct time from a watch face?Yes   List the Names of Other Physician/Practitioners you currently use: Oncology,   Screening Tests / Date Colonoscopy  -Declines                 Zostavax Age 63 Mammogram UTD Influenza Vaccine - Due Tetanus/tdap- unable due to finances  ROS: GEN- denies fatigue, fever, weight loss,weakness, recent illness HEENT- denies eye drainage, change in vision, nasal discharge, CVS- denies chest pain, palpitations RESP- denies SOB, cough, wheeze ABD- denies N/V, change in stools, abd pain GU- denies dysuria, hematuria, dribbling, incontinence MSK- denies joint pain, muscle aches, injury Neuro- denies headache, dizziness, syncope, seizure activity  Physical: GEN- NAD, alert and oriented x3 HEENT- PERRL, EOMI, non injected sclera, pink conjunctiva, MMM, oropharynx clear Neck- Supple, no thryomegaly CVS- RRR, no murmur RESP-CTAB ABD-NABS,soft,NT,ND EXT- No edema Pulses- Radial, DP- 2+    Assessment:    Annual wellness medicare exam   Plan:    During the course of the visit the patient was educated and counseled about appropriate screening and preventive  services including:  Colorectal cancer screening - Declines  Flu shot given  Fasting labs  Screen + for depression. Active treatment already .  Diet review for nutrition referral? Yes ____ Not Indicated __x__  Patient Instructions (the written plan) was given to the patient.  Medicare Attestation  I have personally reviewed:  The patient's medical and social history  Their use of  alcohol, tobacco or illicit drugs  Their current medications and supplements  The patient's functional ability including ADLs,fall risks, home safety risks, cognitive, and hearing and visual impairment  Diet and physical activities  Evidence for depression or mood disorders  The patient's weight, height, BMI, and visual acuity have been recorded in the chart. I have made referrals, counseling, and provided education to the patient based on review of the above and I have provided the patient with a written personalized care plan for preventive services.

## 2015-05-28 NOTE — Patient Instructions (Addendum)
Opthalmology Referral  Stool cards done Restart gabapentin '100mg'$  three times a day  Pain medicine refilled FLu shot  Movantik for your bowels F/U 4 months

## 2015-05-28 NOTE — Assessment & Plan Note (Signed)
Controlled irritation medication

## 2015-05-28 NOTE — Assessment & Plan Note (Addendum)
Continue cymbalta, difficult financial situation  Between her and son

## 2015-05-28 NOTE — Assessment & Plan Note (Signed)
Restart gabapentin 100 mg up to 3 times a day.

## 2015-05-29 LAB — T4, FREE: Free T4: 1 ng/dL (ref 0.80–1.80)

## 2015-05-29 LAB — T3, FREE: T3 FREE: 2.9 pg/mL (ref 2.3–4.2)

## 2015-05-29 LAB — COMPREHENSIVE METABOLIC PANEL
ALBUMIN: 4.1 g/dL (ref 3.6–5.1)
ALK PHOS: 153 U/L — AB (ref 33–130)
ALT: 12 U/L (ref 6–29)
AST: 13 U/L (ref 10–35)
BILIRUBIN TOTAL: 0.3 mg/dL (ref 0.2–1.2)
BUN: 15 mg/dL (ref 7–25)
CO2: 30 mmol/L (ref 20–31)
CREATININE: 0.84 mg/dL (ref 0.50–1.05)
Calcium: 9.2 mg/dL (ref 8.6–10.4)
Chloride: 99 mmol/L (ref 98–110)
Glucose, Bld: 131 mg/dL — ABNORMAL HIGH (ref 70–99)
Potassium: 4.9 mmol/L (ref 3.5–5.3)
SODIUM: 138 mmol/L (ref 135–146)
TOTAL PROTEIN: 6.8 g/dL (ref 6.1–8.1)

## 2015-05-29 LAB — LIPID PANEL
CHOL/HDL RATIO: 5.4 ratio — AB (ref <=5.0)
Cholesterol: 167 mg/dL (ref 125–200)
HDL: 31 mg/dL — ABNORMAL LOW (ref >=46)
LDL Cholesterol: 77 mg/dL (ref <130)
Triglycerides: 297 mg/dL — ABNORMAL HIGH (ref <150)
VLDL: 59 mg/dL — ABNORMAL HIGH (ref <30)

## 2015-05-29 LAB — TSH: TSH: 4.922 u[IU]/mL — AB (ref 0.350–4.500)

## 2015-05-30 ENCOUNTER — Other Ambulatory Visit: Payer: Self-pay | Admitting: *Deleted

## 2015-05-30 DIAGNOSIS — R7989 Other specified abnormal findings of blood chemistry: Secondary | ICD-10-CM

## 2015-05-30 DIAGNOSIS — C3490 Malignant neoplasm of unspecified part of unspecified bronchus or lung: Secondary | ICD-10-CM | POA: Diagnosis not present

## 2015-06-07 ENCOUNTER — Other Ambulatory Visit (HOSPITAL_COMMUNITY): Payer: Self-pay

## 2015-06-07 ENCOUNTER — Ambulatory Visit (HOSPITAL_COMMUNITY): Payer: Self-pay | Admitting: Hematology & Oncology

## 2015-06-12 NOTE — Progress Notes (Signed)
This encounter was created in error - please disregard.

## 2015-06-20 ENCOUNTER — Telehealth: Payer: Self-pay | Admitting: Family Medicine

## 2015-06-20 NOTE — Telephone Encounter (Signed)
Ok to refill??  Last office visit 06/07/2015.  Last refill 05/28/2015.

## 2015-06-20 NOTE — Telephone Encounter (Signed)
Patient is calling to get rx for her percocet  [phone]

## 2015-06-21 MED ORDER — OXYCODONE-ACETAMINOPHEN 7.5-325 MG PO TABS: 1.0000 | ORAL_TABLET | Freq: Three times a day (TID) | ORAL | Status: DC | PRN

## 2015-06-21 NOTE — Telephone Encounter (Signed)
Prescription printed and patient made aware to come to office to pick up.  

## 2015-06-21 NOTE — Telephone Encounter (Signed)
okay

## 2015-06-30 DIAGNOSIS — C3490 Malignant neoplasm of unspecified part of unspecified bronchus or lung: Secondary | ICD-10-CM | POA: Diagnosis not present

## 2015-07-15 DIAGNOSIS — E119 Type 2 diabetes mellitus without complications: Secondary | ICD-10-CM | POA: Diagnosis not present

## 2015-07-15 DIAGNOSIS — H35033 Hypertensive retinopathy, bilateral: Secondary | ICD-10-CM | POA: Diagnosis not present

## 2015-07-15 DIAGNOSIS — H25013 Cortical age-related cataract, bilateral: Secondary | ICD-10-CM | POA: Diagnosis not present

## 2015-07-15 DIAGNOSIS — H2513 Age-related nuclear cataract, bilateral: Secondary | ICD-10-CM | POA: Diagnosis not present

## 2015-07-15 DIAGNOSIS — H3509 Other intraretinal microvascular abnormalities: Secondary | ICD-10-CM | POA: Diagnosis not present

## 2015-07-15 LAB — HM DIABETES EYE EXAM

## 2015-07-16 ENCOUNTER — Encounter: Payer: Self-pay | Admitting: *Deleted

## 2015-07-22 ENCOUNTER — Telehealth: Payer: Self-pay | Admitting: Family Medicine

## 2015-07-22 MED ORDER — OXYCODONE-ACETAMINOPHEN 7.5-325 MG PO TABS: 1.0000 | ORAL_TABLET | Freq: Three times a day (TID) | ORAL | Status: DC | PRN

## 2015-07-22 NOTE — Telephone Encounter (Signed)
Prescription printed and patient made aware to come to office to pick up on 07/23/2015.

## 2015-07-22 NOTE — Telephone Encounter (Signed)
Okay to refill? 

## 2015-07-22 NOTE — Telephone Encounter (Signed)
Pt is calling for a refill of Percocet.  [phone]

## 2015-07-22 NOTE — Telephone Encounter (Signed)
Ok to refill??  Last office visit 05/28/2015.  Last refill 06/21/2015.

## 2015-07-31 DIAGNOSIS — C3490 Malignant neoplasm of unspecified part of unspecified bronchus or lung: Secondary | ICD-10-CM | POA: Diagnosis not present

## 2015-08-19 ENCOUNTER — Telehealth: Payer: Self-pay | Admitting: Family Medicine

## 2015-08-19 MED ORDER — OXYCODONE-ACETAMINOPHEN 7.5-325 MG PO TABS: 1.0000 | ORAL_TABLET | Freq: Three times a day (TID) | ORAL | Status: DC | PRN

## 2015-08-19 NOTE — Telephone Encounter (Signed)
Ok to refill??  Last office visit 06/07/2015.  Last refill 07/22/2015.

## 2015-08-19 NOTE — Telephone Encounter (Signed)
Prescription printed and patient made aware to come to office to pick up on 08/20/2015.

## 2015-08-19 NOTE — Telephone Encounter (Signed)
Patient needs rx for her oxycodone  [phone]

## 2015-08-19 NOTE — Telephone Encounter (Signed)
OKAY TO REFILL?

## 2015-08-21 ENCOUNTER — Other Ambulatory Visit: Payer: Self-pay | Admitting: Family Medicine

## 2015-08-21 NOTE — Telephone Encounter (Signed)
Refill appropriate and filled per protocol. 

## 2015-08-28 DIAGNOSIS — C3490 Malignant neoplasm of unspecified part of unspecified bronchus or lung: Secondary | ICD-10-CM | POA: Diagnosis not present

## 2015-09-13 ENCOUNTER — Ambulatory Visit: Payer: Self-pay | Admitting: Family Medicine

## 2015-09-17 ENCOUNTER — Ambulatory Visit (INDEPENDENT_AMBULATORY_CARE_PROVIDER_SITE_OTHER): Payer: Commercial Managed Care - HMO | Admitting: Family Medicine

## 2015-09-17 ENCOUNTER — Encounter: Payer: Self-pay | Admitting: Family Medicine

## 2015-09-17 VITALS — BP 128/74 | HR 64 | Temp 98.3°F | Resp 16 | Ht 67.0 in | Wt 245.0 lb

## 2015-09-17 DIAGNOSIS — C799 Secondary malignant neoplasm of unspecified site: Secondary | ICD-10-CM

## 2015-09-17 DIAGNOSIS — D45 Polycythemia vera: Secondary | ICD-10-CM | POA: Diagnosis not present

## 2015-09-17 DIAGNOSIS — I10 Essential (primary) hypertension: Secondary | ICD-10-CM

## 2015-09-17 DIAGNOSIS — E119 Type 2 diabetes mellitus without complications: Secondary | ICD-10-CM | POA: Diagnosis not present

## 2015-09-17 DIAGNOSIS — R7989 Other specified abnormal findings of blood chemistry: Secondary | ICD-10-CM

## 2015-09-17 DIAGNOSIS — E785 Hyperlipidemia, unspecified: Secondary | ICD-10-CM | POA: Diagnosis not present

## 2015-09-17 DIAGNOSIS — E669 Obesity, unspecified: Secondary | ICD-10-CM | POA: Diagnosis not present

## 2015-09-17 DIAGNOSIS — J9611 Chronic respiratory failure with hypoxia: Secondary | ICD-10-CM | POA: Diagnosis not present

## 2015-09-17 DIAGNOSIS — G6289 Other specified polyneuropathies: Secondary | ICD-10-CM

## 2015-09-17 DIAGNOSIS — C439 Malignant melanoma of skin, unspecified: Secondary | ICD-10-CM

## 2015-09-17 DIAGNOSIS — R946 Abnormal results of thyroid function studies: Secondary | ICD-10-CM

## 2015-09-17 LAB — LIPID PANEL
CHOL/HDL RATIO: 6.5 ratio — AB (ref <=5.0)
CHOLESTEROL: 195 mg/dL (ref 125–200)
HDL: 30 mg/dL — AB (ref >=46)
LDL Cholesterol: 101 mg/dL (ref <130)
Triglycerides: 320 mg/dL — ABNORMAL HIGH (ref <150)
VLDL: 64 mg/dL — ABNORMAL HIGH (ref <30)

## 2015-09-17 LAB — COMPREHENSIVE METABOLIC PANEL
ALBUMIN: 4.4 g/dL (ref 3.6–5.1)
ALT: 13 U/L (ref 6–29)
AST: 13 U/L (ref 10–35)
Alkaline Phosphatase: 148 U/L — ABNORMAL HIGH (ref 33–130)
BILIRUBIN TOTAL: 0.3 mg/dL (ref 0.2–1.2)
BUN: 17 mg/dL (ref 7–25)
CHLORIDE: 101 mmol/L (ref 98–110)
CO2: 30 mmol/L (ref 20–31)
CREATININE: 0.66 mg/dL (ref 0.50–1.05)
Calcium: 10.4 mg/dL (ref 8.6–10.4)
Glucose, Bld: 155 mg/dL — ABNORMAL HIGH (ref 70–99)
POTASSIUM: 5.3 mmol/L (ref 3.5–5.3)
Sodium: 145 mmol/L (ref 135–146)
TOTAL PROTEIN: 7.4 g/dL (ref 6.1–8.1)

## 2015-09-17 LAB — T3, FREE: T3 FREE: 3 pg/mL (ref 2.3–4.2)

## 2015-09-17 LAB — TSH: TSH: 1.98 m[IU]/L

## 2015-09-17 LAB — T4, FREE: Free T4: 1.2 ng/dL (ref 0.8–1.8)

## 2015-09-17 MED ORDER — OXYCODONE-ACETAMINOPHEN 7.5-325 MG PO TABS: 1.0000 | ORAL_TABLET | Freq: Three times a day (TID) | ORAL | Status: DC | PRN

## 2015-09-17 NOTE — Assessment & Plan Note (Signed)
Her diabetes has been well controlled however her fasting blood sugars are elevated and she's been having more problems with her neuropathy. Concerned that her A1c is no longer controlled. I'll recheck this today and we will adjust her medications as needed she is on ACE inhibitor and statin drug

## 2015-09-17 NOTE — Assessment & Plan Note (Signed)
She is being monitored by oncology, new referral placed for continue services

## 2015-09-17 NOTE — Progress Notes (Signed)
Patient ID: Katelyn Cline, female   DOB: Apr 06, 1961, 55 y.o.   MRN: [id number]   Subjective:    Patient ID: Katelyn Cline, female    DOB: [birth year], 55 y.o.   MRN: [id number]  Patient presents for 4 month F/U   Patient here to follow chronic medical problems. Diabetes mellitus last A1c was 6.9%. CBG- fasting blood sugars have been elevated 170-200's, taking metformin as prescribed , she feels bad when blood sugar goes up.She also notices that her neuropathy is a little worse since her sugars have been up  She is hypertriglyceridemia her last triglycerides were down to 297 at the last visit I added omega-3 Fish oil thousand milligrams twice a day in addition to her Lipitor, using pressure cooker instead of frying foods like previous  She also had mildly abnormal thyroid function studies which is due for repeat today  She has chronic hypoxia especially nocturnal she also has underlying polycythemia vera which affects her oxygenation. She will like to have a different oxygen concentrator at home  She has chronic pain including fibromyalgia which she is maintained with Cymbalta which helps her depression as well as Percocet and gabapentin   Review Of Systems:  GEN- denies fatigue, fever, weight loss,weakness, recent illness HEENT- denies eye drainage, change in vision, nasal discharge, CVS- denies chest pain, palpitations RESP- denies SOB, cough, wheeze ABD- denies N/V, change in stools, abd pain GU- denies dysuria, hematuria, dribbling, incontinence MSK- + joint pain, muscle aches, injury Neuro- denies headache, dizziness, syncope, seizure activity       Objective:    BP 128/74 mmHg  Pulse 64  Temp(Src) 98.3 F (36.8 C) (Oral)  Resp 16  Ht '5\' 7"'$  (1.702 m)  Wt 245 lb (111.131 kg)  BMI 38.36 kg/m2 GEN- NAD, alert and oriented x3 HEENT- PERRL, EOMI, non injected sclera, pink conjunctiva, MMM, oropharynx clear, nares mild congestion  Neck- Supple, no thyromegaly CVS- RRR, no  murmur RESP-CTAB EXT- No edema Pulses- Radial, DP- 2+        Assessment & Plan:      Problem List Items Addressed This Visit    Polycythemia vera ([name])   Relevant Medications   oxyCODONE-acetaminophen (PERCOCET) 7.5-325 MG tablet   Peripheral neuropathy ([name])   Obesity   Metastatic melanoma ([place])    She is being monitored by oncology, new referral placed for continue services      Relevant Medications   oxyCODONE-acetaminophen (PERCOCET) 7.5-325 MG tablet   Hyperlipidemia   Relevant Orders   Lipid panel   Essential hypertension, benign    Blood pressure well controlled and change medication      Relevant Orders   CBC with Differential/Platelet   Comprehensive metabolic panel   Diabetes mellitus type II, controlled ([place]) - Primary    Her diabetes has been well controlled however her fasting blood sugars are elevated and she's been having more problems with her neuropathy. Concerned that her A1c is no longer controlled. I'll recheck this today and we will adjust her medications as needed she is on ACE inhibitor and statin drug      Relevant Orders   CBC with Differential/Platelet   Comprehensive metabolic panel   Hemoglobin A1c   HM DIABETES FOOT EXAM (Completed)   Chronic hypoxemic respiratory failure ([name])    She continues to benefit from nocturnal oxygen will switch her over to the portable oxygen generator       Other Visit Diagnoses    Elevated TSH  Note: This dictation was prepared with Dragon dictation along with smaller phrase technology. Any transcriptional errors that result from this process are unintentional.

## 2015-09-17 NOTE — Assessment & Plan Note (Signed)
Blood pressure well controlled and change medication

## 2015-09-17 NOTE — Patient Instructions (Signed)
We will call with lab results  Oxygen to be ordered  Referral to oncology  F/u 4 months

## 2015-09-17 NOTE — Assessment & Plan Note (Addendum)
She continues to benefit from nocturnal oxygen will switch her over to the portable oxygen generator

## 2015-09-17 NOTE — Addendum Note (Signed)
Addended by: [name] F on: 09/17/2015 08:58 AM   Modules accepted: Orders

## 2015-09-18 LAB — CBC WITH DIFFERENTIAL/PLATELET
Basophils Absolute: 0 10*3/uL (ref 0.0–0.1)
Basophils Relative: 0 % (ref 0–1)
EOS ABS: 0.5 10*3/uL (ref 0.0–0.7)
Eosinophils Relative: 3 % (ref 0–5)
HCT: 55.4 % — ABNORMAL HIGH (ref 36.0–46.0)
HEMOGLOBIN: 17 g/dL — AB (ref 12.0–15.0)
LYMPHS ABS: 2.5 10*3/uL (ref 0.7–4.0)
Lymphocytes Relative: 15 % (ref 12–46)
MCH: 24.4 pg — AB (ref 26.0–34.0)
MCHC: 30.7 g/dL (ref 30.0–36.0)
MCV: 79.4 fL (ref 78.0–100.0)
MONO ABS: 0.7 10*3/uL (ref 0.1–1.0)
MONOS PCT: 4 % (ref 3–12)
NEUTROS PCT: 78 % — AB (ref 43–77)
Neutro Abs: 12.8 10*3/uL — ABNORMAL HIGH (ref 1.7–7.7)
PLATELETS: 334 10*3/uL (ref 150–400)
RBC: 6.98 MIL/uL — ABNORMAL HIGH (ref 3.87–5.11)
RDW: 20.7 % — ABNORMAL HIGH (ref 11.5–15.5)
WBC: 16.4 10*3/uL — ABNORMAL HIGH (ref 4.0–10.5)

## 2015-09-18 LAB — HEMOGLOBIN A1C
HEMOGLOBIN A1C: 7.1 % — AB (ref <5.7)
MEAN PLASMA GLUCOSE: 157 mg/dL — AB (ref <117)

## 2015-09-19 ENCOUNTER — Other Ambulatory Visit: Payer: Self-pay | Admitting: *Deleted

## 2015-09-19 ENCOUNTER — Other Ambulatory Visit (HOSPITAL_COMMUNITY): Payer: Self-pay | Admitting: Hematology & Oncology

## 2015-09-19 DIAGNOSIS — D45 Polycythemia vera: Secondary | ICD-10-CM

## 2015-09-19 MED ORDER — BLOOD GLUCOSE TEST VI STRP: ORAL_STRIP | Status: DC

## 2015-09-19 NOTE — Telephone Encounter (Signed)
Received fax requesting PA for test strips.   New prescription sent to pharmacy in regards to test strips.

## 2015-09-25 ENCOUNTER — Telehealth: Payer: Self-pay | Admitting: *Deleted

## 2015-09-25 NOTE — Telephone Encounter (Signed)
Received call from patient.   Reports that Katelyn Cline is requesting order for titration for portable O2.   States that request was faxed to office, but they have not received order back.   MD please advise.

## 2015-09-25 NOTE — Telephone Encounter (Signed)
Okay to order?

## 2015-09-26 NOTE — Telephone Encounter (Signed)
Call placed to [name].   Was advised that patient requires pulse dose oximetry titration study. Requested for the order to be re-submitted to our office.   Was also advised that MD should check appropriate box and initial and date beside check mark.   MD to be made aware.

## 2015-09-27 ENCOUNTER — Encounter (HOSPITAL_COMMUNITY)
Admission: RE | Admit: 2015-09-27 | Discharge: 2015-09-27 | Disposition: A | Discharge status: Home / Self Care | Payer: Commercial Managed Care - HMO | Source: Ambulatory Visit | Attending: Hematology & Oncology | Admitting: Hematology & Oncology

## 2015-09-27 ENCOUNTER — Other Ambulatory Visit (HOSPITAL_COMMUNITY): Payer: Self-pay

## 2015-09-27 ENCOUNTER — Ambulatory Visit (HOSPITAL_COMMUNITY): Payer: Self-pay | Admitting: Hematology & Oncology

## 2015-09-27 DIAGNOSIS — D45 Polycythemia vera: Secondary | ICD-10-CM

## 2015-09-27 NOTE — Telephone Encounter (Signed)
signed

## 2015-09-28 DIAGNOSIS — C3490 Malignant neoplasm of unspecified part of unspecified bronchus or lung: Secondary | ICD-10-CM | POA: Diagnosis not present

## 2015-09-30 ENCOUNTER — Telehealth: Payer: Self-pay | Admitting: *Deleted

## 2015-09-30 NOTE — Telephone Encounter (Signed)
Submitted [employer] referral thru acuity connect for authorization on 09/26/15 to Dr. Holley Dexter with authorization [id number]  Requesting provider: Neysa Hotter  Treating provider: Holley Dexter  Number of visits:6  Start Date: 10/01/15  End Date: 03/29/16  Dx: C79.9- Secondary malignant neoplasm of unspecifed site       C44.91-Basal cell carcinoma of skin, unspecified

## 2015-10-01 ENCOUNTER — Encounter (HOSPITAL_BASED_OUTPATIENT_CLINIC_OR_DEPARTMENT_OTHER): Payer: Commercial Managed Care - HMO | Admitting: Hematology & Oncology

## 2015-10-01 ENCOUNTER — Encounter (HOSPITAL_COMMUNITY)
Admission: RE | Admit: 2015-10-01 | Discharge: 2015-10-01 | Disposition: A | Discharge status: Home / Self Care | Payer: Commercial Managed Care - HMO | Source: Ambulatory Visit | Attending: Hematology & Oncology | Admitting: Hematology & Oncology

## 2015-10-01 ENCOUNTER — Encounter (HOSPITAL_COMMUNITY): Payer: Self-pay | Admitting: Hematology & Oncology

## 2015-10-01 ENCOUNTER — Other Ambulatory Visit (HOSPITAL_COMMUNITY): Payer: Self-pay

## 2015-10-01 ENCOUNTER — Other Ambulatory Visit (HOSPITAL_COMMUNITY): Payer: Self-pay | Admitting: Oncology

## 2015-10-01 ENCOUNTER — Encounter (HOSPITAL_COMMUNITY): Payer: Self-pay

## 2015-10-01 VITALS — BP 124/49 | HR 83 | Temp 98.7°F | Resp 20 | Wt 250.0 lb

## 2015-10-01 DIAGNOSIS — I10 Essential (primary) hypertension: Secondary | ICD-10-CM | POA: Insufficient documentation

## 2015-10-01 DIAGNOSIS — F17200 Nicotine dependence, unspecified, uncomplicated: Secondary | ICD-10-CM | POA: Diagnosis not present

## 2015-10-01 DIAGNOSIS — E119 Type 2 diabetes mellitus without complications: Secondary | ICD-10-CM | POA: Insufficient documentation

## 2015-10-01 DIAGNOSIS — G47419 Narcolepsy without cataplexy: Secondary | ICD-10-CM | POA: Insufficient documentation

## 2015-10-01 DIAGNOSIS — M419 Scoliosis, unspecified: Secondary | ICD-10-CM | POA: Diagnosis not present

## 2015-10-01 DIAGNOSIS — M797 Fibromyalgia: Secondary | ICD-10-CM | POA: Diagnosis not present

## 2015-10-01 DIAGNOSIS — G8929 Other chronic pain: Secondary | ICD-10-CM | POA: Diagnosis not present

## 2015-10-01 DIAGNOSIS — E785 Hyperlipidemia, unspecified: Secondary | ICD-10-CM | POA: Diagnosis not present

## 2015-10-01 DIAGNOSIS — Z87891 Personal history of nicotine dependence: Secondary | ICD-10-CM | POA: Insufficient documentation

## 2015-10-01 DIAGNOSIS — F329 Major depressive disorder, single episode, unspecified: Secondary | ICD-10-CM | POA: Diagnosis not present

## 2015-10-01 DIAGNOSIS — C439 Malignant melanoma of skin, unspecified: Secondary | ICD-10-CM

## 2015-10-01 DIAGNOSIS — G629 Polyneuropathy, unspecified: Secondary | ICD-10-CM | POA: Insufficient documentation

## 2015-10-01 DIAGNOSIS — C799 Secondary malignant neoplasm of unspecified site: Secondary | ICD-10-CM

## 2015-10-01 DIAGNOSIS — G4730 Sleep apnea, unspecified: Secondary | ICD-10-CM | POA: Insufficient documentation

## 2015-10-01 DIAGNOSIS — D45 Polycythemia vera: Secondary | ICD-10-CM | POA: Diagnosis not present

## 2015-10-01 NOTE — Patient Instructions (Addendum)
[place] at [facility] Discharge Instructions  RECOMMENDATIONS MADE BY THE CONSULTANT AND ANY TEST RESULTS WILL BE SENT TO YOUR REFERRING PHYSICIAN.    Exam and discussion by Dr Whitney Muse today Labs with 2nd phlebotomy  Return to see the doctor after scans when you have your third phlebotomy CT scans scheduled  Please call the clinic if you have any questions or concerns     Thank you for choosing [place] at [facility] to provide your oncology and hematology care.  To afford each patient quality time with our provider, please arrive at least 15 minutes before your scheduled appointment time.   Beginning January 23rd 2017 lab work for the [facility] will be done in the  Main lab at [facility] on 1st floor. If you have a lab appointment with the [facility] please come in thru the  Main Entrance and check in at the main information desk  You need to re-schedule your appointment should you arrive 10 or more minutes late.  We strive to give you quality time with our providers, and arriving late affects you and other patients whose appointments are after yours.  Also, if you no show three or more times for appointments you may be dismissed from the clinic at the providers discretion.     Again, thank you for choosing [facility].  Our hope is that these requests will decrease the amount of time that you wait before being seen by our physicians.       _____________________________________________________________  Should you have questions after your visit to [facility], please contact our office at [phone] between the hours of 8:30 a.m. and 4:30 p.m.  Voicemails left after 4:30 p.m. will not be returned until the following business day.  For prescription refill requests, have your pharmacy contact our office.         Resources For Cancer Patients and their Caregivers ? American Cancer Society: Can  assist with transportation, wigs, general needs, runs Look Good Feel Better.        ([phone] ? Cancer Care: Provides financial assistance, online support groups, medication/co-pay assistance.  1-800-813-HOPE 403 802 0433) ? [facility] Assists [place] cancer patients and their families through emotional , educational and financial support.  (606) 169-1335 ? [place] DSS Where to apply for food stamps, Medicaid and utility assistance. 510-711-9734 ? RCATS: Transportation to medical appointments. (415)568-2405 ? Social Security Administration: May apply for disability if have a Stage IV cancer. 863-888-7481 806-488-2088 ? [employer], Disability and Transit Services: Assists with nutrition, care and transit needs. [phone]

## 2015-10-01 NOTE — Assessment & Plan Note (Deleted)
Smoking cessation addressed. Health benefits of smoking addressed.

## 2015-10-01 NOTE — Progress Notes (Signed)
Katelyn Blackbird, MD [address] [place] [address]    DIAGNOSIS: Metastatic melanoma, BRAF mutation negative             Right lung resection by Dr. Servando Snare on 10/21/2012             JAK 2 positive P. Vera                 SUMMARY OF ONCOLOGIC HISTORY:   Polycythemia vera (Katelyn Cline)   08/05/2012 Initial Diagnosis Polycythemia vera (Katelyn Cline)    Metastatic melanoma (Katelyn Cline)   10/18/2012 Initial Diagnosis Metastatic melanoma    CURRENT THERAPY: Observation        Phlebotomy   INTERVAL HISTORY: Katelyn Cline 55 y.o. female returns for follow-up of a history of stage IV melanoma, primary site not identified and polycythemia vera. She has a service dog and he is here with her today.She has no major complaints. She denies headaches, blurry vision, chest pain, or other significant complaints. Last documented mammogram and Epic was in June 2016. She notes she has not had a screening colonoscopy in the past "10 years."   Katelyn Cline was here with her service dog and was using a cane.  She has pain in the center of her shoulder blades and over to her shoulders. The pain is not there all the time and only occasionally when she takes a deep breath but when it is she said that it feels like a stabbing or grabbing pain.  Her dog was sick in December and during this her anxiety and panic attacks went through the roof. She doesn't go out much without him.  Her breathing has been rough lately she is getting her portable oxygen concentrator soon.  She said that she feels crappy all the time.  Her bowels are fine, she has had no belly pain.  Her appetite is good and says that she has been eating better. Her son got her a pressure cooker for Christmas and has been using this for cooking.   She does not have any new lumps or bumps anywhere. She notes it is time for repeat imaging. She is currently undergoing phlebotomy.   MEDICAL HISTORY: Past Medical History  Diagnosis Date  . Narcolepsy    . HTN (hypertension)   . Fibromyalgia   . Scoliosis   . Migraines   . Major depression (Katelyn Cline)   . Panic attacks   . Agoraphobia   . Chronic pain   . Miscarriage     x 4  . Peripheral neuropathy (Katelyn Cline)   . Hyperlipidemia   . Elevated hemoglobin (Katelyn Cline) 2014  . Vertigo   . Polycythemia secondary to smoking   . Dysrhythmia     Hx: of palpitations "a long time ago"  . Shortness of breath     Hx: of with activity  . Sleep apnea   . Basal cell carcinoma   . Polycythemia vera(238.4)   . Metastatic melanoma (Katelyn Cline) 10/18/2012    12 mm posterior right upper lobe pulmonary nodule, max SUV 3.0    . Chronic respiratory failure with hypoxia ([name])     3L [place]  . Peripheral neuropathy ([name])     in feet  . DM type 2 (diabetes mellitus, type 2) (Katelyn Cline) 10/16/2013  . History of UTI     has DDD (degenerative disc disease), lumbar; DDD (degenerative disc disease), cervical; Chronic pain disorder; Depression; Fibromyalgia; Hyperlipidemia; Narcolepsy; Peripheral neuropathy (Katelyn Cline); Tobacco use disorder; Obesity;  Essential hypertension, benign; Non-healing skin lesion of nose; Polycythemia vera (Katelyn Cline); Polycythemia secondary to smoking; Metastatic melanoma (Katelyn Cline); Diabetes mellitus type II, controlled (Katelyn Cline); Chronic hypoxemic respiratory failure (Katelyn Cline); Basal cell carcinoma of skin; Posttraumatic stress disorder; MRSA (methicillin resistant staph aureus) culture positive; Gastroenteritis; PVC (premature ventricular contraction); Seborrheic keratoses; OE (otitis externa); and Constipation on her problem list.     is allergic to contrast media; erythromycin; flagyl; latex; other; penicillins; and tetracyclines & related.  Katelyn Cline does not currently have medications on file.  SURGICAL HISTORY: Past Surgical History  Procedure Laterality Date  . Vaginal hysterectomy    . Cystectomy      abdominal wall   . Basal cell carcinoma excision      flap surgery on face  . Colonoscopy w/ biopsies and polypectomy        Hx: of  . Dilation and curettage of uterus      Hx: of   . Video assisted thoracoscopy (vats)/wedge resection Right 10/21/2012    Procedure: VIDEO ASSISTED THORACOSCOPY (VATS)/WEDGE RESECTION;  Surgeon: [name], MD;  Location: [facility];  Service: Thoracic;  Laterality: Right;  . Video bronchoscopy N/A 10/21/2012    Procedure: VIDEO BRONCHOSCOPY;  Surgeon: [name], MD;  Location: [name];  Service: Thoracic;  Laterality: N/A;  . Breast lumpectomy      bilateral    SOCIAL HISTORY: Social History   Social History  . Marital Status: Divorced    Spouse Name: N/A  . Number of Children: N/A  . Years of Education: 12+   Occupational History  . Not on file.   Social History Main Topics  . Smoking status: Former Smoker -- 2.00 packs/day for 5 years    Types: Cigarettes    Quit date: 09/16/2012  . Smokeless tobacco: Never Used  . Alcohol Use: Yes     Comment: 1 -2 drinks a month, occasional  . Drug Use: No  . Sexual Activity: Not Currently   Other Topics Concern  . Not on file   Social History Narrative    FAMILY HISTORY: Family History  Problem Relation Age of Onset  . Arthritis Mother   . Hyperlipidemia Mother   . Depression Mother   . Anxiety disorder Mother   . Dementia Mother   . Hypertension Father   . Hyperlipidemia Father   . Heart disease Father   . Stroke Father   . Dementia Father   . Heart disease Brother   . Alcohol abuse Maternal Grandfather   . Bipolar disorder Neg Hx   . Drug abuse Neg Hx   . OCD Neg Hx   . Paranoid behavior Neg Hx   . Schizophrenia Neg Hx   . Seizures Neg Hx   . Sexual abuse Neg Hx   . Physical abuse Neg Hx   . ADD / ADHD Son     Review of Systems  Constitutional: Positive for weight gain and malaise. Negative for fever, and chills.  Feels crappy all the time. HENT: Negative for congestion, hearing loss, nosebleeds, sore throat and tinnitus.   Eyes: Negative for blurred vision, double vision, pain and  discharge.  Respiratory: Negative for cough, hemoptysis, sputum production and wheezing.  Breathing has been rough lately.  Cardiovascular: Negative for chest pain, palpitations, claudication, leg swelling and PND.  Gastrointestinal: Negative for heartburn, nausea, vomiting, abdominal pain, diarrhea, constipation, blood in stool and melena.  Genitourinary: Negative for dysuria and hematuria.  Musculoskeletal: Positive for joint pain and shoulder blade  pain. Negative for myalgias and falls.  Pain is in center of shoulder blades and continues out, intermittent Skin: Negative for itching and rash.  Neurological: Negative for dizziness, tingling, tremors, sensory change, speech change, focal weakness, seizures, loss of consciousness and headaches.  Endo/Heme/Allergies: Does not bruise/bleed easily.  Psychiatric/Behavioral: Negative for suicidal ideas, memory loss and substance abuse. The patient does not have insomnia.    PHYSICAL EXAMINATION ECOG PERFORMANCE STATUS: 1 - Symptomatic but completely ambulatory  Filed Vitals:   10/01/15 0900  BP: 124/49  Pulse: 83  Temp: 98.7 F (37.1 C)  Resp: 20    Physical Exam  Constitutional: She is oriented to person, place, and time and well-developed, well-nourished, and in no distress.      Obese . Wears glasses. HENT:  Head: Normocephalic and atraumatic.  Nose: Nose normal.  Mouth/Throat: Oropharynx is clear and moist. No oropharyngeal exudate.  Eyes: Conjunctivae and EOM are normal. Pupils are equal, round, and reactive to light. Right eye exhibits no discharge. Left eye exhibits no discharge. No scleral icterus.  Neck: Normal range of motion. Neck supple. No tracheal deviation present. No thyromegaly present.  Cardiovascular: Normal rate, regular rhythm and normal heart sounds.  Exam reveals no gallop and no friction rub.   No murmur heard. Pulmonary/Chest: Effort normal and breath sounds normal. She has no wheezes. She has no rales.    Abdominal: Soft. Bowel sounds are normal. She exhibits no distension and no mass. There is no tenderness. There is no rebound and no guarding.     Exam limited secondary to body habitus  Musculoskeletal: Normal range of motion. She exhibits no edema.      Left upper thigh distinct palpable mobile mass, approximately 2 cm in largest diameter.  Lymphadenopathy:    She has no cervical adenopathy.  Neurological: She is alert and oriented to person, place, and time. She has normal reflexes. No cranial nerve deficit. Gait normal. Coordination normal.  Skin: Skin is warm and dry. No rash noted.  Psychiatric: Mood, memory, affect and judgment normal.  Nursing note and vitals reviewed.   LABORATORY DATA: I have reviewed the data as listed.   CBC    Component Value Date/Time   WBC 16.4* 09/17/2015 0844   RBC 6.98* 09/17/2015 0844   HGB 17.0* 09/17/2015 0844   HCT 55.4* 09/17/2015 0844   PLT 334 09/17/2015 0844   MCV 79.4 09/17/2015 0844   MCH 24.4* 09/17/2015 0844   MCHC 30.7 09/17/2015 0844   RDW 20.7* 09/17/2015 0844   LYMPHSABS 2.5 09/17/2015 0844   MONOABS 0.7 09/17/2015 0844   EOSABS 0.5 09/17/2015 0844   BASOSABS 0.0 09/17/2015 0844   CMP     Component Value Date/Time   NA 145 09/17/2015 0844   K 5.3 09/17/2015 0844   CL 101 09/17/2015 0844   CO2 30 09/17/2015 0844   GLUCOSE 155* 09/17/2015 0844   BUN 17 09/17/2015 0844   CREATININE 0.66 09/17/2015 0844   CREATININE 0.61 03/08/2015 1050   CALCIUM 10.4 09/17/2015 0844   PROT 7.4 09/17/2015 0844   ALBUMIN 4.4 09/17/2015 0844   AST 13 09/17/2015 0844   ALT 13 09/17/2015 0844   ALKPHOS 148* 09/17/2015 0844   BILITOT 0.3 09/17/2015 0844   GFRNONAA >60 03/08/2015 1050   GFRAA >60 03/08/2015 1050    RADIOLOGY: I have reviewed the images below and agree with the report  CLINICAL DATA: Follow up stage IV metastatic melanoma. Metastatic disease to the lungs.  EXAM: CT  CHEST, ABDOMEN AND PELVIS WITHOUT  CONTRAST  TECHNIQUE: Multidetector CT imaging of the chest, abdomen and pelvis was performed following the standard protocol without IV contrast.  COMPARISON: Multiple exams, including 08/29/2014  FINDINGS: CT CHEST FINDINGS IMPRESSION: 1. Small left lung nodules stable from 2014. Postoperative findings in the right upper lobe. 2. Atherosclerosis.   Electronically Signed  By: Van Clines M.D.  On: 11/09/2014 10:01  ASSESSMENT and THERAPY PLAN:  JAK 2 positive Polycythemia Vera Secondary Erythrocytosis Stage IV melanoma   Polycythemia vera JAK2 positive, polycythemia vera.  Managed with therapeutic phlebotomies. She has been non-compliant recently. Currently undergoing phlebotomy X 3. Goal HCT < 45.  She has periods where she remains compliant and periods where she does not.  I emphasized the importance of preventing disease related complications through maintaining HCT < 45 such as stroke, MI and other thromboembolic complications.   We will obtain labs at her next phlebotomy.  Metastatic melanoma She is due for repeat imaging. We will set her up for CT C/A/P. She would like to return to review results.   She will be due for repeat mammogram in June.  She was referred to Dr. Arnoldo Morale for the thigh mass (? Lipoma) late last year will get copies of the last office visit.  All questions were answered. The patient knows to call the clinic with any problems, questions or concerns. We can certainly see the patient much sooner if necessary. This note was electronically signed.  This document serves as a record of services personally performed by Ancil Linsey, MD. It was created on her behalf by Kandace Blitz, a trained medical scribe. The creation of this record is based on the scribe's personal observations and the provider's statements to them. This document has been checked and approved by the attending provider.  I have reviewed the above documentation for  accuracy and completeness, and I agree with the above. Molli Hazard, MD

## 2015-10-01 NOTE — Assessment & Plan Note (Addendum)
JAK2 positive, polycythemia vera.  Managed with therapeutic phlebotomies. She has been non-compliant recently. Currently undergoing phlebotomy X 3. Goal HCT < 45.  She has periods where she remains compliant and periods where she does not.  I emphasized the importance of preventing disease related complications through maintaining HCT < 45 such as stroke, MI and other thromboembolic complications.   We will obtain labs at her next phlebotomy.

## 2015-10-01 NOTE — Assessment & Plan Note (Signed)
She is due for repeat imaging. We will set her up for CT C/A/P. She would like to return to review results.

## 2015-10-01 NOTE — Progress Notes (Addendum)
Katelyn Cline presents today for phlebotomy per MD orders.  Phlebotomy procedure started at 0843 and ended at 0851 22 ounces removed. Patient tolerated procedure well. IV needle removed intact. Patient had rescheduled appointment today from 09/27/2015.

## 2015-10-04 ENCOUNTER — Encounter (HOSPITAL_COMMUNITY): Payer: Commercial Managed Care - HMO

## 2015-10-08 ENCOUNTER — Ambulatory Visit (HOSPITAL_COMMUNITY)
Admission: RE | Admit: 2015-10-08 | Discharge: 2015-10-08 | Disposition: A | Discharge status: Home / Self Care | Payer: Commercial Managed Care - HMO | Source: Ambulatory Visit | Attending: Oncology | Admitting: Oncology

## 2015-10-08 ENCOUNTER — Other Ambulatory Visit: Payer: Self-pay | Admitting: *Deleted

## 2015-10-08 ENCOUNTER — Encounter (HOSPITAL_COMMUNITY)
Admission: RE | Admit: 2015-10-08 | Discharge: 2015-10-08 | Disposition: A | Discharge status: Home / Self Care | Payer: Commercial Managed Care - HMO | Source: Ambulatory Visit | Attending: Hematology & Oncology | Admitting: Hematology & Oncology

## 2015-10-08 ENCOUNTER — Other Ambulatory Visit (HOSPITAL_COMMUNITY): Payer: Self-pay

## 2015-10-08 ENCOUNTER — Encounter (HOSPITAL_COMMUNITY): Payer: Self-pay

## 2015-10-08 DIAGNOSIS — E785 Hyperlipidemia, unspecified: Secondary | ICD-10-CM | POA: Diagnosis not present

## 2015-10-08 DIAGNOSIS — M797 Fibromyalgia: Secondary | ICD-10-CM | POA: Diagnosis not present

## 2015-10-08 DIAGNOSIS — C7800 Secondary malignant neoplasm of unspecified lung: Secondary | ICD-10-CM | POA: Insufficient documentation

## 2015-10-08 DIAGNOSIS — I10 Essential (primary) hypertension: Secondary | ICD-10-CM | POA: Diagnosis not present

## 2015-10-08 DIAGNOSIS — G47419 Narcolepsy without cataplexy: Secondary | ICD-10-CM | POA: Diagnosis not present

## 2015-10-08 DIAGNOSIS — R918 Other nonspecific abnormal finding of lung field: Secondary | ICD-10-CM | POA: Diagnosis not present

## 2015-10-08 DIAGNOSIS — C799 Secondary malignant neoplasm of unspecified site: Secondary | ICD-10-CM

## 2015-10-08 DIAGNOSIS — C439 Malignant melanoma of skin, unspecified: Secondary | ICD-10-CM | POA: Diagnosis not present

## 2015-10-08 DIAGNOSIS — F329 Major depressive disorder, single episode, unspecified: Secondary | ICD-10-CM | POA: Diagnosis not present

## 2015-10-08 DIAGNOSIS — G8929 Other chronic pain: Secondary | ICD-10-CM | POA: Diagnosis not present

## 2015-10-08 DIAGNOSIS — M419 Scoliosis, unspecified: Secondary | ICD-10-CM | POA: Diagnosis not present

## 2015-10-08 DIAGNOSIS — D45 Polycythemia vera: Secondary | ICD-10-CM | POA: Diagnosis not present

## 2015-10-08 LAB — CBC WITH DIFFERENTIAL/PLATELET
BASOS ABS: 0.1 10*3/uL (ref 0.0–0.1)
BASOS PCT: 0 %
EOS ABS: 0.4 10*3/uL (ref 0.0–0.7)
Eosinophils Relative: 3 %
HEMATOCRIT: 47.8 % — AB (ref 36.0–46.0)
Hemoglobin: 14.7 g/dL (ref 12.0–15.0)
Lymphocytes Relative: 19 %
Lymphs Abs: 2.6 10*3/uL (ref 0.7–4.0)
MCH: 24.3 pg — ABNORMAL LOW (ref 26.0–34.0)
MCHC: 30.8 g/dL (ref 30.0–36.0)
MCV: 78.9 fL (ref 78.0–100.0)
MONO ABS: 0.7 10*3/uL (ref 0.1–1.0)
MONOS PCT: 5 %
NEUTROS ABS: 10.4 10*3/uL — AB (ref 1.7–7.7)
Neutrophils Relative %: 73 %
PLATELETS: 338 10*3/uL (ref 150–400)
RBC: 6.06 MIL/uL — ABNORMAL HIGH (ref 3.87–5.11)
RDW: 19.8 % — AB (ref 11.5–15.5)
WBC: 14.1 10*3/uL — ABNORMAL HIGH (ref 4.0–10.5)

## 2015-10-08 LAB — COMPREHENSIVE METABOLIC PANEL
ALK PHOS: 119 U/L (ref 38–126)
ALT: 17 U/L (ref 14–54)
ANION GAP: 9 (ref 5–15)
AST: 20 U/L (ref 15–41)
Albumin: 3.5 g/dL (ref 3.5–5.0)
BILIRUBIN TOTAL: 0.2 mg/dL — AB (ref 0.3–1.2)
BUN: 19 mg/dL (ref 6–20)
CALCIUM: 8.6 mg/dL — AB (ref 8.9–10.3)
CO2: 32 mmol/L (ref 22–32)
CREATININE: 0.66 mg/dL (ref 0.44–1.00)
Chloride: 102 mmol/L (ref 101–111)
GFR calc non Af Amer: >60 mL/min (ref >60)
Glucose, Bld: 223 mg/dL — ABNORMAL HIGH (ref 65–99)
Potassium: 4.6 mmol/L (ref 3.5–5.1)
Sodium: 143 mmol/L (ref 135–145)
TOTAL PROTEIN: 7 g/dL (ref 6.5–8.1)

## 2015-10-08 LAB — LIPID PANEL
CHOL/HDL RATIO: 6.8 ratio
CHOLESTEROL: 190 mg/dL (ref 0–200)
HDL: 28 mg/dL — AB (ref >40)
LDL Cholesterol: 84 mg/dL (ref 0–99)
TRIGLYCERIDES: 392 mg/dL — AB (ref <150)
VLDL: 78 mg/dL — ABNORMAL HIGH (ref 0–40)

## 2015-10-08 LAB — LACTATE DEHYDROGENASE: LDH: 145 U/L (ref 98–192)

## 2015-10-08 MED ORDER — FENOFIBRATE 48 MG PO TABS: 48.0000 mg | ORAL_TABLET | Freq: Every day | ORAL | Status: DC

## 2015-10-08 NOTE — Progress Notes (Addendum)
Katelyn Cline presents today for phlebotomy per MD order Phlebotomy procedure started at 0921 and ended at 0929 27 ounces removed. Patient tolerated procedure well. IV needle removed intact. Labs were also drawn as patient presented with request, CBC with diff, CMET, LDH Results for HEAVAN, FRANCOM (MRN [id number]) as of 10/08/2015 13:34  Ref. Range 10/08/2015 09:25  Sodium Latest Ref Range: 135-145 mmol/L 143  Potassium Latest Ref Range: 3.5-5.1 mmol/L 4.6  Chloride Latest Ref Range: 101-111 mmol/L 102  CO2 Latest Ref Range: 22-32 mmol/L 32  BUN Latest Ref Range: 6-20 mg/dL 19  Creatinine Latest Ref Range: 0.44-1.00 mg/dL 0.66  Calcium Latest Ref Range: 8.9-10.3 mg/dL 8.6 (L)  EGFR (Non-African Amer.) Latest Ref Range: >60 mL/min >60  EGFR (African American) Latest Ref Range: >60 mL/min >60  Glucose Latest Ref Range: 65-99 mg/dL 223 (H)  Anion gap Latest Ref Range: 5-15  9  Alkaline Phosphatase Latest Ref Range: 38-126 U/L 119  Albumin Latest Ref Range: 3.5-5.0 g/dL 3.5  AST Latest Ref Range: 15-41 U/L 20  ALT Latest Ref Range: 14-54 U/L 17  Total Protein Latest Ref Range: 6.5-8.1 g/dL 7.0  Total Bilirubin Latest Ref Range: 0.3-1.2 mg/dL 0.2 (L)  LDH Latest Ref Range: 98-192 U/L 145  Cholesterol Latest Ref Range: 0-200 mg/dL 190  Triglycerides Latest Ref Range: <150 mg/dL 392 (H)  HDL Cholesterol Latest Ref Range: >40 mg/dL 28 (L)  LDL (calc) Latest Ref Range: 0-99 mg/dL 84  VLDL Latest Ref Range: 0-40 mg/dL 78 (H)  Total CHOL/HDL Ratio Latest Units: RATIO 6.8  WBC Latest Ref Range: 4.0-10.5 K/uL 14.1 (H)  RBC Latest Ref Range: 3.87-5.11 MIL/uL 6.06 (H)  Hemoglobin Latest Ref Range: 12.0-15.0 g/dL 14.7  HCT Latest Ref Range: 36.0-46.0 % 47.8 (H)  MCV Latest Ref Range: 78.0-100.0 fL 78.9  MCH Latest Ref Range: 26.0-34.0 pg 24.3 (L)  MCHC Latest Ref Range: 30.0-36.0 g/dL 30.8  RDW Latest Ref Range: 11.5-15.5 % 19.8 (H)  Platelets Latest Ref Range: 150-400 K/uL 338   Neutrophils Latest Units: % 73  Lymphocytes Latest Units: % 19  Monocytes Relative Latest Units: % 5  Eosinophil Latest Units: % 3  Basophil Latest Units: % 0  NEUT# Latest Ref Range: 1.7-7.7 K/uL 10.4 (H)  Lymphocyte # Latest Ref Range: 0.7-4.0 K/uL 2.6  Monocyte # Latest Ref Range: 0.1-1.0 K/uL 0.7  Eosinophils Absolute Latest Ref Range: 0.0-0.7 K/uL 0.4  Basophils Absolute Latest Ref Range: 0.0-0.1 K/uL 0.1

## 2015-10-10 ENCOUNTER — Ambulatory Visit (HOSPITAL_COMMUNITY): Payer: Commercial Managed Care - HMO

## 2015-10-10 ENCOUNTER — Encounter (HOSPITAL_COMMUNITY): Payer: Commercial Managed Care - HMO

## 2015-10-15 ENCOUNTER — Telehealth: Payer: Self-pay | Admitting: Family Medicine

## 2015-10-15 ENCOUNTER — Encounter (HOSPITAL_BASED_OUTPATIENT_CLINIC_OR_DEPARTMENT_OTHER): Payer: Commercial Managed Care - HMO | Admitting: Hematology & Oncology

## 2015-10-15 ENCOUNTER — Encounter (HOSPITAL_COMMUNITY): Payer: Self-pay

## 2015-10-15 ENCOUNTER — Encounter (HOSPITAL_COMMUNITY)
Admission: RE | Admit: 2015-10-15 | Discharge: 2015-10-15 | Disposition: A | Discharge status: Home / Self Care | Payer: Commercial Managed Care - HMO | Source: Ambulatory Visit | Attending: Hematology & Oncology | Admitting: Hematology & Oncology

## 2015-10-15 ENCOUNTER — Encounter (HOSPITAL_COMMUNITY): Payer: Self-pay | Admitting: Hematology & Oncology

## 2015-10-15 VITALS — BP 153/63 | HR 85 | Temp 98.2°F | Resp 20 | Wt 252.0 lb

## 2015-10-15 DIAGNOSIS — M797 Fibromyalgia: Secondary | ICD-10-CM | POA: Diagnosis not present

## 2015-10-15 DIAGNOSIS — M419 Scoliosis, unspecified: Secondary | ICD-10-CM | POA: Diagnosis not present

## 2015-10-15 DIAGNOSIS — Z1589 Genetic susceptibility to other disease: Secondary | ICD-10-CM | POA: Diagnosis not present

## 2015-10-15 DIAGNOSIS — G47419 Narcolepsy without cataplexy: Secondary | ICD-10-CM | POA: Diagnosis not present

## 2015-10-15 DIAGNOSIS — C799 Secondary malignant neoplasm of unspecified site: Secondary | ICD-10-CM

## 2015-10-15 DIAGNOSIS — D45 Polycythemia vera: Secondary | ICD-10-CM | POA: Diagnosis not present

## 2015-10-15 DIAGNOSIS — G8929 Other chronic pain: Secondary | ICD-10-CM | POA: Diagnosis not present

## 2015-10-15 DIAGNOSIS — C439 Malignant melanoma of skin, unspecified: Secondary | ICD-10-CM

## 2015-10-15 DIAGNOSIS — F329 Major depressive disorder, single episode, unspecified: Secondary | ICD-10-CM | POA: Diagnosis not present

## 2015-10-15 DIAGNOSIS — E785 Hyperlipidemia, unspecified: Secondary | ICD-10-CM | POA: Diagnosis not present

## 2015-10-15 DIAGNOSIS — I10 Essential (primary) hypertension: Secondary | ICD-10-CM | POA: Diagnosis not present

## 2015-10-15 MED ORDER — OXYCODONE-ACETAMINOPHEN 7.5-325 MG PO TABS: 1.0000 | ORAL_TABLET | Freq: Three times a day (TID) | ORAL | Status: DC | PRN

## 2015-10-15 NOTE — Patient Instructions (Signed)
[place] at [facility] Discharge Instructions  RECOMMENDATIONS MADE BY THE CONSULTANT AND ANY TEST RESULTS WILL BE SENT TO YOUR REFERRING PHYSICIAN.   Exam and discussion by Dr Whitney Muse today CT scans are stable  CBC in 1 month Return to see the doctor in 3 months Please call the clinic if you have any questions or concerns     Thank you for choosing [place] at [facility] to provide your oncology and hematology care.  To afford each patient quality time with our provider, please arrive at least 15 minutes before your scheduled appointment time.   Beginning January 23rd 2017 lab work for the [facility] will be done in the  Main lab at [facility] on 1st floor. If you have a lab appointment with the [facility] please come in thru the  Main Entrance and check in at the main information desk  You need to re-schedule your appointment should you arrive 10 or more minutes late.  We strive to give you quality time with our providers, and arriving late affects you and other patients whose appointments are after yours.  Also, if you no show three or more times for appointments you may be dismissed from the clinic at the providers discretion.     Again, thank you for choosing [facility].  Our hope is that these requests will decrease the amount of time that you wait before being seen by our physicians.       _____________________________________________________________  Should you have questions after your visit to [facility], please contact our office at [phone] between the hours of 8:30 a.m. and 4:30 p.m.  Voicemails left after 4:30 p.m. will not be returned until the following business day.  For prescription refill requests, have your pharmacy contact our office.         Resources For Cancer Patients and their Caregivers ? American Cancer Society: Can assist with transportation, wigs, general needs,  runs Look Good Feel Better.        762-278-5830 ? Cancer Care: Provides financial assistance, online support groups, medication/co-pay assistance.  1-800-813-HOPE (509)030-6413) ? [place] Assists [place] cancer patients and their families through emotional , educational and financial support.  2037774115 ? [place] DSS Where to apply for food stamps, Medicaid and utility assistance. 5850795216 ? RCATS: Transportation to medical appointments. 801-759-1690 ? Social Security Administration: May apply for disability if have a Stage IV cancer. [phone] ? [employer], Disability and Transit Services: Assists with nutrition, care and transit needs. [phone]

## 2015-10-15 NOTE — Progress Notes (Signed)
Katelyn Cline presents today for phlebotomy per MD orders. Phlebotomy procedure started at [place] and ended at 0945. i unit removed. Patient tolerated procedure well. IV needle removed intact. R antecubital x2 sticks

## 2015-10-15 NOTE — Telephone Encounter (Signed)
Ok to refill??  Last office visit/ refill 09/17/2015.

## 2015-10-15 NOTE — Telephone Encounter (Signed)
Prescription printed and patient made aware to come to office to pick up on 10/16/2015.

## 2015-10-15 NOTE — Telephone Encounter (Signed)
Okay to refill? 

## 2015-10-15 NOTE — Progress Notes (Signed)
[name] [place], [place], [place] [address] [place] [address]    DIAGNOSIS: Metastatic melanoma, BRAF mutation negative             Right lung resection by Dr. Servando Snare on 10/21/2012             JAK 2 positive P. Vera                 SUMMARY OF ONCOLOGIC HISTORY:   Polycythemia vera ([place])   08/05/2012 Initial Diagnosis Polycythemia vera ([place])    Metastatic melanoma ([place])   10/08/2015 Imaging CT CAP- Pulmonary nodules are stable to slightly decreased in size. No new sites of metastatic disease in the chest, abdomen or pelvis.     CURRENT THERAPY: Observation        Phlebotomy   INTERVAL HISTORY: Katelyn Cline 55 y.o. female returns for follow-up of a history of stage IV melanoma, primary site not identified and polycythemia vera. She has a service dog and he is here with her today.She has no major complaints. She denies headaches, blurry vision, chest pain, or other significant complaints. Last documented mammogram and Epic was in June 2016. She notes she has not had a screening colonoscopy in the past "10 years."   Katelyn Cline returns to the [facility] alone today. She is accompanied by her service dog, and has a nasal cannula in place for oxygen.  During her appointment today, she was advised that her scans are stable, with some of her pulmonary nodules even appearing smaller.   She notes she is relieved that her scans are good. She has been worried since she has some upper back pain.   She has a phlebotomy scheduled today at 9:00. She says she wonders if her blood could be donated for research to the [employer]. She is very interested in some kind of contribution to research.  Toward the end of her appointment, there was a lot of discussion about the dementia that runs in her family, with the good news that her brother was recently misdiagnosed with it.  She has no other major complaints or concerns today.     MEDICAL HISTORY: Past Medical History  Diagnosis Date  . Narcolepsy   . HTN (hypertension)   . Fibromyalgia   . Scoliosis   . Migraines   . Major depression ([place])   . Panic attacks   . Agoraphobia   . Chronic pain   . Miscarriage     x 4  . Peripheral neuropathy ([place])   . Hyperlipidemia   . Elevated hemoglobin ([place]) 2014  . Vertigo   . Polycythemia secondary to smoking   . Dysrhythmia     Hx: of palpitations "a long time ago"  . Shortness of breath     Hx: of with activity  . Sleep apnea   . Basal cell carcinoma   . Polycythemia vera(238.4)   . Metastatic melanoma ([place]) 10/18/2012    12 mm posterior right upper lobe pulmonary nodule, max SUV 3.0    . Chronic respiratory failure with hypoxia ([name])     3L [place]  . Peripheral neuropathy ([name])     in feet  . DM type 2 (diabetes mellitus, type 2) ([place]) 10/16/2013  . History of UTI     has DDD (degenerative disc disease), lumbar; DDD (degenerative disc disease), cervical; Chronic pain disorder; Depression; Fibromyalgia; Hyperlipidemia; Narcolepsy; Peripheral neuropathy ([place]);  Tobacco use disorder; Obesity; Essential hypertension, benign; Non-healing skin lesion of nose; Polycythemia vera ([name]); Polycythemia secondary to smoking; Metastatic melanoma ([name]); Diabetes mellitus type II, controlled ([name]); Chronic hypoxemic respiratory failure ([name]); Basal cell carcinoma of skin; Posttraumatic stress disorder; MRSA (methicillin resistant staph aureus) culture positive; Gastroenteritis; PVC (premature ventricular contraction); Seborrheic keratoses; OE (otitis externa); and Constipation on her problem list.     is allergic to contrast media; erythromycin; flagyl; latex; other; penicillins; and tetracyclines & related.  Katelyn Cline does not currently have medications on file.  SURGICAL HISTORY: Past Surgical History  Procedure Laterality Date  . Vaginal hysterectomy    . Cystectomy      abdominal wall   . Basal cell carcinoma  excision      flap surgery on face  . Colonoscopy w/ biopsies and polypectomy      Hx: of  . Dilation and curettage of uterus      Hx: of   . Video assisted thoracoscopy (vats)/wedge resection Right 10/21/2012    Procedure: VIDEO ASSISTED THORACOSCOPY (VATS)/WEDGE RESECTION;  Surgeon: Delight Ovens, MD;  Location: [facility] OR;  Service: Thoracic;  Laterality: Right;  . Video bronchoscopy N/A 10/21/2012    Procedure: VIDEO BRONCHOSCOPY;  Surgeon: Delight Ovens, MD;  Location: [facility] OR;  Service: Thoracic;  Laterality: N/A;  . Breast lumpectomy      bilateral    SOCIAL HISTORY: Social History   Social History  . Marital Status: Divorced    Spouse Name: N/A  . Number of Children: N/A  . Years of Education: 12+   Occupational History  . Not on file.   Social History Main Topics  . Smoking status: Former Smoker -- 2.00 packs/day for 5 years    Types: Cigarettes    Quit date: 09/16/2012  . Smokeless tobacco: Never Used  . Alcohol Use: Yes     Comment: 1 -2 drinks a month, occasional  . Drug Use: No  . Sexual Activity: Not Currently   Other Topics Concern  . Not on file   Social History Narrative    FAMILY HISTORY: Family History  Problem Relation Age of Onset  . Arthritis Mother   . Hyperlipidemia Mother   . Depression Mother   . Anxiety disorder Mother   . Dementia Mother   . Hypertension Father   . Hyperlipidemia Father   . Heart disease Father   . Stroke Father   . Dementia Father   . Heart disease Brother   . Alcohol abuse Maternal Grandfather   . Bipolar disorder Neg Hx   . Drug abuse Neg Hx   . OCD Neg Hx   . Paranoid behavior Neg Hx   . Schizophrenia Neg Hx   . Seizures Neg Hx   . Sexual abuse Neg Hx   . Physical abuse Neg Hx   . ADD / ADHD Son     Review of Systems  Constitutional: Positive for weight gain and malaise. Negative for fever, and chills.  Feels crappy all the time. HENT: Negative for congestion, hearing loss, nosebleeds, sore throat  and tinnitus.   Eyes: Negative for blurred vision, double vision, pain and discharge.  Respiratory: Negative for cough, hemoptysis, sputum production and wheezing.  Breathing has been rough lately.  Cardiovascular: Negative for chest pain, palpitations, claudication, leg swelling and PND.  Gastrointestinal: Negative for heartburn, nausea, vomiting, abdominal pain, diarrhea, constipation, blood in stool and melena.  Genitourinary: Negative for dysuria and hematuria.  Musculoskeletal: Positive for joint pain  and shoulder blade pain. Negative for myalgias and falls.  Pain is in center of shoulder blades and continues out, intermittent Skin: Negative for itching and rash.  Neurological: Negative for dizziness, tingling, tremors, sensory change, speech change, focal weakness, seizures, loss of consciousness and headaches.  Endo/Heme/Allergies: Does not bruise/bleed easily.  Psychiatric/Behavioral: Negative for suicidal ideas, memory loss and substance abuse. The patient does not have insomnia.    14 point review of systems was performed and is negative except as detailed under history of present illness and above   PHYSICAL EXAMINATION ECOG PERFORMANCE STATUS: 1 - Symptomatic but completely ambulatory  Filed Vitals:   10/15/15 0800  BP: 153/63  Pulse: 85  Temp: 98.2 F (36.8 C)  Resp: 20    Physical Exam  Constitutional: She is oriented to person, place, and time and well-developed, well-nourished, and in no distress.      Obese . Wears glasses. HENT:  Head: Normocephalic and atraumatic.  Nose: Nose normal.  Mouth/Throat: Oropharynx is clear and moist. No oropharyngeal exudate.  Eyes: Conjunctivae and EOM are normal. Pupils are equal, round, and reactive to light. Right eye exhibits no discharge. Left eye exhibits no discharge. No scleral icterus.  Neck: Normal range of motion. Neck supple. No tracheal deviation present. No thyromegaly present.  Cardiovascular: Normal rate, regular  rhythm and normal heart sounds.  Exam reveals no gallop and no friction rub.   No murmur heard. Pulmonary/Chest: Effort normal and breath sounds normal. She has no wheezes. She has no rales.  Abdominal: Soft. Bowel sounds are normal. She exhibits no distension and no mass. There is no tenderness. There is no rebound and no guarding.     Exam limited secondary to body habitus  Musculoskeletal: Normal range of motion. She exhibits no edema.      Left upper thigh distinct palpable mobile mass, approximately 2 cm in largest diameter.  Lymphadenopathy:    She has no cervical adenopathy.  Neurological: She is alert and oriented to person, place, and time. She has normal reflexes. No cranial nerve deficit. Gait normal. Coordination normal.  Skin: Skin is warm and dry. No rash noted.  Psychiatric: Mood, memory, affect and judgment normal.  Nursing note and vitals reviewed.   LABORATORY DATA: I have reviewed the data as listed.   CBC    Component Value Date/Time   WBC 14.1* 10/08/2015 0925   RBC 6.06* 10/08/2015 0925   HGB 14.7 10/08/2015 0925   HCT 47.8* 10/08/2015 0925   PLT 338 10/08/2015 0925   MCV 78.9 10/08/2015 0925   MCH 24.3* 10/08/2015 0925   MCHC 30.8 10/08/2015 0925   RDW 19.8* 10/08/2015 0925   LYMPHSABS 2.6 10/08/2015 0925   MONOABS 0.7 10/08/2015 0925   EOSABS 0.4 10/08/2015 0925   BASOSABS 0.1 10/08/2015 0925   CMP     Component Value Date/Time   NA 143 10/08/2015 0925   K 4.6 10/08/2015 0925   CL 102 10/08/2015 0925   CO2 32 10/08/2015 0925   GLUCOSE 223* 10/08/2015 0925   BUN 19 10/08/2015 0925   CREATININE 0.66 10/08/2015 0925   CREATININE 0.66 09/17/2015 0844   CALCIUM 8.6* 10/08/2015 0925   PROT 7.0 10/08/2015 0925   ALBUMIN 3.5 10/08/2015 0925   AST 20 10/08/2015 0925   ALT 17 10/08/2015 0925   ALKPHOS 119 10/08/2015 0925   BILITOT 0.2* 10/08/2015 0925   GFRNONAA >60 10/08/2015 0925   GFRAA >60 10/08/2015 0925    RADIOLOGY: I have reviewed the  images below and agree with the report  10/08/2015  Study Result     CLINICAL DATA: Stage IV metastatic melanoma, presenting for restaging.  EXAM: CT CHEST, ABDOMEN AND PELVIS WITHOUT CONTRAST  TECHNIQUE: Multidetector CT imaging of the chest, abdomen and pelvis was performed following the standard protocol without IV contrast.  COMPARISON: 05/16/2015 chest CT. 11/09/2014 CT chest, abdomen and pelvis.  FINDINGS: Motion degraded study.  CT CHEST  Mediastinum/Nodes: Normal heart size. No pericardial fluid/thickening. Left main, left anterior descending, left circumflex and right coronary atherosclerosis. Atherosclerotic nonaneurysmal thoracic aorta. Dilated main pulmonary artery (3.4 cm diameter), unchanged. No discrete thyroid nodule in the visualized thyroid gland. Normal esophagus. No pathologically enlarged axillary, mediastinal or gross hilar lymph nodes, noting limited sensitivity for the detection of hilar adenopathy on this noncontrast study.  Lungs/Pleura: No pneumothorax. No pleural effusion. There are at least 10 scattered small pulmonary nodules throughout both lungs, which appear stable to slightly decreased in size since 05/16/2015. For example a 4 mm apical right upper lobe pulmonary nodule (series 6/image 11) is decreased from 6 mm, and 6 mm right middle lobe (series 6/image 28) and 6 mm left lower lobe (series 6/ image 39) pulmonary nodules are stable. No acute consolidative airspace disease or new significant pulmonary nodules. Stable mild scarring in the peripheral right upper lobe.  Musculoskeletal: No aggressive appearing focal osseous lesions. Mild degenerative changes in the thoracic spine.  CT ABDOMEN AND PELVIS  Hepatobiliary: Normal liver with no liver mass. Normal gallbladder with no radiopaque cholelithiasis. No biliary ductal dilatation.  Pancreas: Normal, with no mass or duct dilation.  Spleen: Normal size. No  mass.  Adrenals/Urinary Tract: Normal adrenals. No hydronephrosis. No renal stones. No contour deforming renal mass. Normal bladder.  Stomach/Bowel: Grossly normal stomach. Normal caliber small bowel with no small bowel wall thickening. Normal appendix. Mild sigmoid diverticulosis, with no large bowel wall thickening or pericolonic fat stranding.  Vascular/Lymphatic: Atherosclerotic nonaneurysmal abdominal aorta. No pathologically enlarged lymph nodes in the abdomen or pelvis.  Reproductive: Status post hysterectomy, with no abnormal findings at the vaginal cuff. No adnexal mass.  Other: No pneumoperitoneum, ascites or focal fluid collection.  Musculoskeletal: No aggressive appearing focal osseous lesions. Moderate degenerative changes in the lumbar spine.  IMPRESSION: 1. Pulmonary nodules are stable to slightly decreased in size. 2. No new sites of metastatic disease in the chest, abdomen or pelvis. 3. Additional findings include coronary atherosclerosis, stable dilated main pulmonary artery suggesting chronic pulmonary arterial hypertension and mild sigmoid diverticulosis.   Electronically Signed  By: Ilona Sorrel M.D.  On: 10/08/2015 14:46    ASSESSMENT and THERAPY PLAN:  JAK 2 positive Polycythemia Vera Secondary Erythrocytosis Stage IV melanoma   Polycythemia vera JAK2 positive, polycythemia vera.  Managed with therapeutic phlebotomies. She has been non-compliant recently. Currently undergoing phlebotomy X 3. Goal HCT < 45.  She has periods where she remains compliant and periods where she does not.  I emphasized the importance of preventing disease related complications through maintaining HCT < 45 such as stroke, MI and other thromboembolic complications. She currently has another phlebotomy today.  Will check another CBC in 4 weeks.   Metastatic melanoma CT imaging was reviewed with the patient in detail. We will continue with ongoing observation.  Scans will be obtained again in 6 months, sooner if symptomatic.  Screening She will be due for repeat mammogram in June. This is ordered.  Orders Placed This Encounter  Procedures  . CBC    Standing Status:  Standing     Number of Occurrences: 6     Standing Expiration Date: 10/14/2016   All questions were answered. The patient knows to call the clinic with any problems, questions or concerns. We can certainly see the patient much sooner if necessary.  This document serves as a record of services personally performed by Ancil Linsey, MD. It was created on her behalf by Toni Amend, a trained medical scribe. The creation of this record is based on the scribe's personal observations and the provider's statements to them. This document has been checked and approved by the attending provider.  This note was electronically signed.  I have reviewed the above documentation for accuracy and completeness, and I agree with the above. Molli Hazard, MD

## 2015-10-15 NOTE — Telephone Encounter (Signed)
Pt is requesting a refill of Oxycodone  [phone]

## 2015-10-17 ENCOUNTER — Other Ambulatory Visit: Payer: Self-pay | Admitting: Family Medicine

## 2015-10-18 NOTE — Telephone Encounter (Signed)
Refill appropriate and filled per protocol. 

## 2015-10-28 DIAGNOSIS — C3490 Malignant neoplasm of unspecified part of unspecified bronchus or lung: Secondary | ICD-10-CM | POA: Diagnosis not present

## 2015-10-28 DIAGNOSIS — J9611 Chronic respiratory failure with hypoxia: Secondary | ICD-10-CM | POA: Diagnosis not present

## 2015-11-12 ENCOUNTER — Telehealth: Payer: Self-pay | Admitting: Family Medicine

## 2015-11-12 MED ORDER — OXYCODONE-ACETAMINOPHEN 7.5-325 MG PO TABS: 1.0000 | ORAL_TABLET | Freq: Three times a day (TID) | ORAL | Status: DC | PRN

## 2015-11-12 NOTE — Telephone Encounter (Signed)
Ok to refill??  Last office visit 09/17/2015.  Last refill 10/15/2015.

## 2015-11-12 NOTE — Telephone Encounter (Signed)
Patient needs rx for her percocet  [phone]

## 2015-11-12 NOTE — Telephone Encounter (Signed)
Prescription printed and patient made aware to come to office to pick up on 11/13/2015.

## 2015-11-12 NOTE — Telephone Encounter (Signed)
Okay to refill? 

## 2015-11-14 ENCOUNTER — Other Ambulatory Visit: Payer: Self-pay | Admitting: Family Medicine

## 2015-11-14 ENCOUNTER — Encounter (HOSPITAL_COMMUNITY): Payer: Commercial Managed Care - HMO | Attending: Hematology & Oncology

## 2015-11-14 DIAGNOSIS — I10 Essential (primary) hypertension: Secondary | ICD-10-CM | POA: Insufficient documentation

## 2015-11-14 DIAGNOSIS — C439 Malignant melanoma of skin, unspecified: Secondary | ICD-10-CM | POA: Diagnosis not present

## 2015-11-14 DIAGNOSIS — G4730 Sleep apnea, unspecified: Secondary | ICD-10-CM | POA: Insufficient documentation

## 2015-11-14 DIAGNOSIS — G8929 Other chronic pain: Secondary | ICD-10-CM | POA: Insufficient documentation

## 2015-11-14 DIAGNOSIS — F329 Major depressive disorder, single episode, unspecified: Secondary | ICD-10-CM | POA: Diagnosis not present

## 2015-11-14 DIAGNOSIS — E785 Hyperlipidemia, unspecified: Secondary | ICD-10-CM | POA: Insufficient documentation

## 2015-11-14 DIAGNOSIS — M797 Fibromyalgia: Secondary | ICD-10-CM | POA: Insufficient documentation

## 2015-11-14 DIAGNOSIS — M419 Scoliosis, unspecified: Secondary | ICD-10-CM | POA: Insufficient documentation

## 2015-11-14 DIAGNOSIS — D45 Polycythemia vera: Secondary | ICD-10-CM | POA: Diagnosis not present

## 2015-11-14 DIAGNOSIS — Z87891 Personal history of nicotine dependence: Secondary | ICD-10-CM | POA: Diagnosis not present

## 2015-11-14 DIAGNOSIS — G47419 Narcolepsy without cataplexy: Secondary | ICD-10-CM | POA: Diagnosis not present

## 2015-11-14 DIAGNOSIS — E119 Type 2 diabetes mellitus without complications: Secondary | ICD-10-CM | POA: Diagnosis not present

## 2015-11-14 DIAGNOSIS — G629 Polyneuropathy, unspecified: Secondary | ICD-10-CM | POA: Diagnosis not present

## 2015-11-14 LAB — CBC
HEMATOCRIT: 46.5 % — AB (ref 36.0–46.0)
HEMOGLOBIN: 13.8 g/dL (ref 12.0–15.0)
MCH: 21.9 pg — AB (ref 26.0–34.0)
MCHC: 29.7 g/dL — AB (ref 30.0–36.0)
MCV: 73.7 fL — ABNORMAL LOW (ref 78.0–100.0)
Platelets: 378 10*3/uL (ref 150–400)
RBC: 6.31 MIL/uL — ABNORMAL HIGH (ref 3.87–5.11)
RDW: 20.5 % — AB (ref 11.5–15.5)
WBC: 14.4 10*3/uL — ABNORMAL HIGH (ref 4.0–10.5)

## 2015-11-15 NOTE — Telephone Encounter (Signed)
Refill appropriate and filled per protocol. 

## 2015-11-18 ENCOUNTER — Encounter (HOSPITAL_COMMUNITY)
Admission: RE | Admit: 2015-11-18 | Discharge: 2015-11-18 | Disposition: A | Discharge status: Home / Self Care | Payer: Commercial Managed Care - HMO | Source: Ambulatory Visit | Attending: Hematology & Oncology | Admitting: Hematology & Oncology

## 2015-11-18 DIAGNOSIS — D45 Polycythemia vera: Secondary | ICD-10-CM | POA: Diagnosis not present

## 2015-11-18 NOTE — Progress Notes (Signed)
Katelyn Cline presents today for phlebotomy per MD orders. HGB/HCT:13.8/46.5% Phlebotomy procedure started at 0949 and ended at 0956. 543 cc removed. 18.1oz. Patient tolerated procedure well. Procedure done by J. Coffey, RN IV needle removed intact. R antecubital

## 2015-11-28 DIAGNOSIS — C3490 Malignant neoplasm of unspecified part of unspecified bronchus or lung: Secondary | ICD-10-CM | POA: Diagnosis not present

## 2015-11-28 DIAGNOSIS — J9611 Chronic respiratory failure with hypoxia: Secondary | ICD-10-CM | POA: Diagnosis not present

## 2015-12-06 ENCOUNTER — Other Ambulatory Visit: Payer: Self-pay | Admitting: Family Medicine

## 2015-12-06 ENCOUNTER — Telehealth: Payer: Self-pay | Admitting: Family Medicine

## 2015-12-06 MED ORDER — GABAPENTIN 100 MG PO CAPS: 100.0000 mg | ORAL_CAPSULE | Freq: Three times a day (TID) | ORAL | Status: DC | PRN

## 2015-12-06 NOTE — Telephone Encounter (Signed)
Prescription sent to pharmacy.

## 2015-12-06 NOTE — Telephone Encounter (Signed)
Refill appropriate and filled per protocol. 

## 2015-12-06 NOTE — Telephone Encounter (Signed)
Patient would like refill on her gabapentin if possible she was told to call us  [facility] [place]  [phone] (H)

## 2015-12-10 ENCOUNTER — Telehealth: Payer: Self-pay | Admitting: Family Medicine

## 2015-12-10 NOTE — Telephone Encounter (Signed)
Okay to refill? 

## 2015-12-10 NOTE — Telephone Encounter (Signed)
Pt is requesting a refill of Oxycodone 7.5-325 [phone]

## 2015-12-10 NOTE — Telephone Encounter (Signed)
Ok to refill??  Last office visit 09/17/2015.  Last refill 11/12/2015.

## 2015-12-11 MED ORDER — OXYCODONE-ACETAMINOPHEN 7.5-325 MG PO TABS: 1.0000 | ORAL_TABLET | Freq: Three times a day (TID) | ORAL | Status: DC | PRN

## 2015-12-11 NOTE — Telephone Encounter (Signed)
Prescription printed and patient made aware to come to office to pick up after 2pm on 12/11/2015.

## 2015-12-13 ENCOUNTER — Other Ambulatory Visit: Payer: Self-pay | Admitting: Family Medicine

## 2015-12-17 NOTE — Telephone Encounter (Signed)
Refill appropriate and filled per protocol. 

## 2015-12-28 DIAGNOSIS — J9611 Chronic respiratory failure with hypoxia: Secondary | ICD-10-CM | POA: Diagnosis not present

## 2015-12-28 DIAGNOSIS — C3490 Malignant neoplasm of unspecified part of unspecified bronchus or lung: Secondary | ICD-10-CM | POA: Diagnosis not present

## 2016-01-07 ENCOUNTER — Telehealth: Payer: Self-pay | Admitting: Family Medicine

## 2016-01-07 NOTE — Telephone Encounter (Signed)
Ok to refill??  Last office visit 09/17/2015.  Last refill 12/11/2015.

## 2016-01-07 NOTE — Telephone Encounter (Signed)
Pt is requesting a refill of Oxycodone 7.5-325 [phone]

## 2016-01-07 NOTE — Telephone Encounter (Signed)
Okay to refill? 

## 2016-01-08 MED ORDER — OXYCODONE-ACETAMINOPHEN 7.5-325 MG PO TABS: 1.0000 | ORAL_TABLET | Freq: Three times a day (TID) | ORAL | Status: DC | PRN

## 2016-01-08 NOTE — Telephone Encounter (Signed)
Prescription printed and patient made aware to come to office to pick up after 2pm on 01/08/2016.

## 2016-01-14 ENCOUNTER — Encounter (HOSPITAL_COMMUNITY): Payer: Commercial Managed Care - HMO

## 2016-01-14 ENCOUNTER — Encounter (HOSPITAL_COMMUNITY): Payer: Commercial Managed Care - HMO | Attending: Hematology & Oncology | Admitting: Oncology

## 2016-01-14 ENCOUNTER — Encounter (HOSPITAL_COMMUNITY): Payer: Self-pay | Admitting: Oncology

## 2016-01-14 VITALS — BP 138/71 | HR 80 | Temp 98.5°F | Resp 18 | Wt 246.1 lb

## 2016-01-14 DIAGNOSIS — F329 Major depressive disorder, single episode, unspecified: Secondary | ICD-10-CM | POA: Insufficient documentation

## 2016-01-14 DIAGNOSIS — G4730 Sleep apnea, unspecified: Secondary | ICD-10-CM | POA: Diagnosis not present

## 2016-01-14 DIAGNOSIS — G629 Polyneuropathy, unspecified: Secondary | ICD-10-CM | POA: Diagnosis not present

## 2016-01-14 DIAGNOSIS — I10 Essential (primary) hypertension: Secondary | ICD-10-CM | POA: Diagnosis not present

## 2016-01-14 DIAGNOSIS — C439 Malignant melanoma of skin, unspecified: Secondary | ICD-10-CM | POA: Diagnosis not present

## 2016-01-14 DIAGNOSIS — E785 Hyperlipidemia, unspecified: Secondary | ICD-10-CM | POA: Diagnosis not present

## 2016-01-14 DIAGNOSIS — G8929 Other chronic pain: Secondary | ICD-10-CM | POA: Diagnosis not present

## 2016-01-14 DIAGNOSIS — G47419 Narcolepsy without cataplexy: Secondary | ICD-10-CM | POA: Diagnosis not present

## 2016-01-14 DIAGNOSIS — M419 Scoliosis, unspecified: Secondary | ICD-10-CM | POA: Diagnosis not present

## 2016-01-14 DIAGNOSIS — C799 Secondary malignant neoplasm of unspecified site: Secondary | ICD-10-CM | POA: Diagnosis not present

## 2016-01-14 DIAGNOSIS — E119 Type 2 diabetes mellitus without complications: Secondary | ICD-10-CM | POA: Diagnosis not present

## 2016-01-14 DIAGNOSIS — D45 Polycythemia vera: Secondary | ICD-10-CM | POA: Insufficient documentation

## 2016-01-14 DIAGNOSIS — Z87891 Personal history of nicotine dependence: Secondary | ICD-10-CM | POA: Insufficient documentation

## 2016-01-14 DIAGNOSIS — M797 Fibromyalgia: Secondary | ICD-10-CM | POA: Insufficient documentation

## 2016-01-14 LAB — CBC
HEMATOCRIT: 46.4 % — AB (ref 36.0–46.0)
HEMOGLOBIN: 13.8 g/dL (ref 12.0–15.0)
MCH: 20.9 pg — ABNORMAL LOW (ref 26.0–34.0)
MCHC: 29.7 g/dL — ABNORMAL LOW (ref 30.0–36.0)
MCV: 70.4 fL — ABNORMAL LOW (ref 78.0–100.0)
Platelets: 367 10*3/uL (ref 150–400)
RBC: 6.59 MIL/uL — AB (ref 3.87–5.11)
RDW: 20.8 % — ABNORMAL HIGH (ref 11.5–15.5)
WBC: 18.5 10*3/uL — ABNORMAL HIGH (ref 4.0–10.5)

## 2016-01-14 NOTE — Assessment & Plan Note (Signed)
JAK2 positive, polycythemia vera.  Managed with therapeutic phlebotomies. Goal HCT is < 45%.    Labs today: CBC diff.  I personally reviewed and went over laboratory results with the patient.  The results are noted within this dictation.  HCT is > 45% and therefore, she qualifies for a therapeutic phlebotomy.  This will be scheduled in the near future.  Microcytosis from treatment-induced iron deficiency is noted.  Labs every 4 weeks: CBC.

## 2016-01-14 NOTE — Progress Notes (Signed)
Katelyn Blackbird, MD [name] [place] [address]  Metastatic melanoma ([place]) - Plan: CT Abdomen Pelvis W Contrast, CT Chest W Contrast  Polycythemia vera ([place]) - Plan: Phlebotomy therapeutic  CURRENT THERAPY: Observation/Surveillance and periodic therapeutic phlebotomy to maintain a HCT less than 45%.  INTERVAL HISTORY: Katelyn Cline 55 y.o. female returns for followup of Stage IV melanoma, S/P resection of pulmonary nodule by Dr. Servando Snare on 10/21/2012 revealing aforementioned diagnosis. AND JAK2 positive polycythemia vera managed with therapeutic phlebotomies when indicated.  "I didn't know my Katelyn Cline was a 'cancer.' "  She is provided education regarding P. Vera and its neoplastic definition.  She is overall doing well.  She denies any complaints today.  Review of Systems  Constitutional: Negative for fever, chills and weight loss.  HENT: Negative.   Eyes: Negative.   Respiratory: Positive for shortness of breath (at baseline, Brooke in place for O2 delivery).   Cardiovascular: Negative.   Gastrointestinal: Negative.   Genitourinary: Negative.   Musculoskeletal: Negative.   Skin: Negative.   Neurological: Negative.   Endo/Heme/Allergies: Negative.  Does not bruise/bleed easily.  Psychiatric/Behavioral: Negative.     Past Medical History  Diagnosis Date  . Narcolepsy   . HTN (hypertension)   . Fibromyalgia   . Scoliosis   . Migraines   . Major depression ([place])   . Panic attacks   . Agoraphobia   . Chronic pain   . Miscarriage     x 4  . Peripheral neuropathy ([place])   . Hyperlipidemia   . Elevated hemoglobin ([place] [facility]) 2014  . Vertigo   . Polycythemia secondary to smoking   . Dysrhythmia     Hx: of palpitations "a long time ago"  . Shortness of breath     Hx: of with activity  . Sleep apnea   . Basal cell carcinoma   . Polycythemia vera(238.4)   . Metastatic melanoma ([place]) 10/18/2012    12 mm posterior right upper lobe pulmonary nodule, max  SUV 3.0    . Chronic respiratory failure with hypoxia ([name])     3L [place]  . Peripheral neuropathy ([name])     in feet  . DM type 2 (diabetes mellitus, type 2) ([place]) 10/16/2013  . History of UTI     Past Surgical History  Procedure Laterality Date  . Vaginal hysterectomy    . Cystectomy      abdominal wall   . Basal cell carcinoma excision      flap surgery on face  . Colonoscopy w/ biopsies and polypectomy      Hx: of  . Dilation and curettage of uterus      Hx: of   . Video assisted thoracoscopy (vats)/wedge resection Right 10/21/2012    Procedure: VIDEO ASSISTED THORACOSCOPY (VATS)/WEDGE RESECTION;  Surgeon: [name], MD;  Location: [facility];  Service: Thoracic;  Laterality: Right;  . Video bronchoscopy N/A 10/21/2012    Procedure: VIDEO BRONCHOSCOPY;  Surgeon: [name], MD;  Location: [facility] OR;  Service: Thoracic;  Laterality: N/A;  . Breast lumpectomy      bilateral    Family History  Problem Relation Age of Onset  . Arthritis Mother   . Hyperlipidemia Mother   . Depression Mother   . Anxiety disorder Mother   . Dementia Mother   . Hypertension Father   . Hyperlipidemia Father   . Heart disease Father   . Stroke Father   .  Dementia Father   . Heart disease Brother   . Alcohol abuse Maternal Grandfather   . Bipolar disorder Neg Hx   . Drug abuse Neg Hx   . OCD Neg Hx   . Paranoid behavior Neg Hx   . Schizophrenia Neg Hx   . Seizures Neg Hx   . Sexual abuse Neg Hx   . Physical abuse Neg Hx   . ADD / ADHD Son     Social History   Social History  . Marital Status: Divorced    Spouse Name: N/A  . Number of Children: N/A  . Years of Education: 12+   Social History Main Topics  . Smoking status: Former Smoker -- 2.00 packs/day for 5 years    Types: Cigarettes    Quit date: 09/16/2012  . Smokeless tobacco: Never Used  . Alcohol Use: Yes     Comment: 1 -2 drinks a month, occasional  . Drug Use: No  . Sexual Activity: Not Currently   Other Topics  Concern  . None   Social History Narrative     PHYSICAL EXAMINATION  ECOG PERFORMANCE STATUS: 1 - Symptomatic but completely ambulatory  Filed Vitals:   01/14/16 1029  BP: 138/71  Pulse: 80  Temp: 98.5 F (36.9 C)  Resp: 18    GENERAL:alert, no distress, well nourished, well developed, comfortable, cooperative, obese, smiling and accompanied by her service dog, Eddie. SKIN: skin color, texture, turgor are normal, no rashes or significant lesions HEAD: Normocephalic, No masses, lesions, tenderness or abnormalities EYES: normal, EOMI, Conjunctiva are pink and non-injected EARS: External ears normal OROPHARYNX:lips, buccal mucosa, and tongue normal and mucous membranes are moist  NECK: supple, trachea midline LYMPH:  no palpable lymphadenopathy BREAST:not examined LUNGS: clear to auscultation  HEART: regular rate & rhythm, no murmurs and no gallops ABDOMEN:abdomen soft, non-tender, obese and normal bowel sounds BACK: Back symmetric, no curvature. EXTREMITIES:less then 2 second capillary refill, no joint deformities, effusion, or inflammation, no skin discoloration, no cyanosis  NEURO: alert & oriented x 3 with fluent speech, no focal motor/sensory deficits, gait normal   LABORATORY DATA: CBC    Component Value Date/Time   WBC 18.5* 01/14/2016 0920   RBC 6.59* 01/14/2016 0920   HGB 13.8 01/14/2016 0920   HCT 46.4* 01/14/2016 0920   PLT 367 01/14/2016 0920   MCV 70.4* 01/14/2016 0920   MCH 20.9* 01/14/2016 0920   MCHC 29.7* 01/14/2016 0920   RDW 20.8* 01/14/2016 0920   LYMPHSABS 2.6 10/08/2015 0925   MONOABS 0.7 10/08/2015 0925   EOSABS 0.4 10/08/2015 0925   BASOSABS 0.1 10/08/2015 0925      Chemistry      Component Value Date/Time   NA 143 10/08/2015 0925   K 4.6 10/08/2015 0925   CL 102 10/08/2015 0925   CO2 32 10/08/2015 0925   BUN 19 10/08/2015 0925   CREATININE 0.66 10/08/2015 0925   CREATININE 0.66 09/17/2015 0844      Component Value Date/Time    CALCIUM 8.6* 10/08/2015 0925   ALKPHOS 119 10/08/2015 0925   AST 20 10/08/2015 0925   ALT 17 10/08/2015 0925   BILITOT 0.2* 10/08/2015 0925        PENDING LABS:   RADIOGRAPHIC STUDIES:  No results found.   PATHOLOGY:    ASSESSMENT AND PLAN:  Metastatic melanoma ([place]) Stage IV melanoma, S/P resection of pulmonary nodule by Dr. Servando Snare on 10/21/2012 revealing aforementioned diagnosis. NED.  Labs today: CBC diff, CMET.  I personally reviewed  and went over laboratory results with the patient.  The results are noted within this dictation.    She is due for CT imaging in October 2017.  Orders are in for surveillance imaging.  She is overdue for her mammogram and therefore, order is placed for this important health screening exam.  This this will be scheduled in the near future.  She will return in 3 months for follow-up.    Polycythemia vera JAK2 positive, polycythemia vera.  Managed with therapeutic phlebotomies. Goal HCT is < 45%.    Labs today: CBC diff.  I personally reviewed and went over laboratory results with the patient.  The results are noted within this dictation.  HCT is > 45% and therefore, she qualifies for a therapeutic phlebotomy.  This will be scheduled in the near future.  Microcytosis from treatment-induced iron deficiency is noted.  Labs every 4 weeks: CBC.    ORDERS PLACED FOR THIS ENCOUNTER: Orders Placed This Encounter  Procedures  . Phlebotomy therapeutic  . CT Abdomen Pelvis W Contrast  . CT Chest W Contrast    MEDICATIONS PRESCRIBED THIS ENCOUNTER: No orders of the defined types were placed in this encounter.    THERAPY PLAN:  We will continue to monitor for recurrence of melanoma with imaging every 6 months.  We will also continue to monitor polycythemia and recommend therapeutic phlebotomies for HCT > 45%.  All questions were answered. The patient knows to call the clinic with any problems, questions or concerns. We can certainly see  the patient much sooner if necessary.  Patient and plan discussed with Dr. Ancil Linsey and she is in agreement with the aforementioned.   This note is electronically signed by: Doy Mince 01/14/2016 6:28 PM

## 2016-01-14 NOTE — Assessment & Plan Note (Signed)
Stage IV melanoma, S/P resection of pulmonary nodule by Dr. Servando Snare on 10/21/2012 revealing aforementioned diagnosis. NED.  Labs today: CBC diff, CMET.  I personally reviewed and went over laboratory results with the patient.  The results are noted within this dictation.    She is due for CT imaging in October 2017.  Orders are in for surveillance imaging.  She is overdue for her mammogram and therefore, order is placed for this important health screening exam.  This this will be scheduled in the near future.  She will return in 3 months for follow-up.

## 2016-01-14 NOTE — Patient Instructions (Signed)
[place] at [facility] Discharge Instructions  RECOMMENDATIONS MADE BY THE CONSULTANT AND ANY TEST RESULTS WILL BE SENT TO YOUR REFERRING PHYSICIAN.  You saw Dr. Gershon Mussel today. You are overdue for a mammogram. It will be scheduled for you today. Therapeutic Phlebotomy to be scheduled. They will call you. CT scans in October. Return in 3 months after CT scans.  Thank you for choosing [place] at [facility] to provide your oncology and hematology care.  To afford each patient quality time with our provider, please arrive at least 15 minutes before your scheduled appointment time.   Beginning January 23rd 2017 lab work for the [facility] will be done in the  Main lab at [facility] on 1st floor. If you have a lab appointment with the [facility] please come in thru the  Main Entrance and check in at the main information desk  You need to re-schedule your appointment should you arrive 10 or more minutes late.  We strive to give you quality time with our providers, and arriving late affects you and other patients whose appointments are after yours.  Also, if you no show three or more times for appointments you may be dismissed from the clinic at the providers discretion.     Again, thank you for choosing [facility].  Our hope is that these requests will decrease the amount of time that you wait before being seen by our physicians.       _____________________________________________________________  Should you have questions after your visit to [facility], please contact our office at [phone] between the hours of 8:30 a.m. and 4:30 p.m.  Voicemails left after 4:30 p.m. will not be returned until the following business day.  For prescription refill requests, have your pharmacy contact our office.         Resources For Cancer Patients and their Caregivers ? American Cancer Society: Can assist with  transportation, wigs, general needs, runs Look Good Feel Better.        (640) 224-2888 ? Cancer Care: Provides financial assistance, online support groups, medication/co-pay assistance.  1-800-813-HOPE 586-067-5310) ? [place] Assists [place] cancer patients and their families through emotional , educational and financial support.  (270) 465-5151 ? [place] DSS Where to apply for food stamps, Medicaid and utility assistance. 910-013-1427 ? [facility]: Transportation to medical appointments. 929-375-8426 ? Social Security Administration: May apply for disability if have a Stage IV cancer. (517) 388-0348 (901)821-1258 ? [employer], Disability and Transit Services: Assists with nutrition, care and transit needs. [place] Support Programs: '@10RELATIVEDAYS'$ @ > Cancer Support Group  2nd Tuesday of the month 1pm-2pm, Journey Room  > Creative Journey  3rd Tuesday of the month 1130am-1pm, Journey Room  > Look Good Feel Better  1st Wednesday of the month 10am-12 noon, Journey Room (Call [facility] to register [phone])

## 2016-01-17 ENCOUNTER — Ambulatory Visit: Payer: Self-pay | Admitting: Family Medicine

## 2016-01-21 ENCOUNTER — Encounter: Payer: Self-pay | Admitting: Family Medicine

## 2016-01-21 ENCOUNTER — Ambulatory Visit (INDEPENDENT_AMBULATORY_CARE_PROVIDER_SITE_OTHER): Payer: Commercial Managed Care - HMO | Admitting: Family Medicine

## 2016-01-21 VITALS — BP 140/72 | HR 86 | Temp 98.7°F | Resp 20 | Ht 67.0 in | Wt 244.0 lb

## 2016-01-21 DIAGNOSIS — J9611 Chronic respiratory failure with hypoxia: Secondary | ICD-10-CM

## 2016-01-21 DIAGNOSIS — I10 Essential (primary) hypertension: Secondary | ICD-10-CM

## 2016-01-21 DIAGNOSIS — E785 Hyperlipidemia, unspecified: Secondary | ICD-10-CM

## 2016-01-21 DIAGNOSIS — E119 Type 2 diabetes mellitus without complications: Secondary | ICD-10-CM

## 2016-01-21 DIAGNOSIS — R5383 Other fatigue: Secondary | ICD-10-CM | POA: Diagnosis not present

## 2016-01-21 LAB — CBC WITH DIFFERENTIAL/PLATELET
BASOS ABS: 0 {cells}/uL (ref 0–200)
Basophils Relative: 0 %
EOS ABS: 350 {cells}/uL (ref 15–500)
Eosinophils Relative: 2 %
HCT: 44.4 % (ref 35.0–45.0)
HEMOGLOBIN: 13.7 g/dL (ref 12.0–15.0)
LYMPHS ABS: 3325 {cells}/uL (ref 850–3900)
Lymphocytes Relative: 19 %
MCH: 20.8 pg — AB (ref 27.0–33.0)
MCHC: 30.9 g/dL — ABNORMAL LOW (ref 32.0–36.0)
MCV: 67.3 fL — AB (ref 80.0–100.0)
MONO ABS: 525 {cells}/uL (ref 200–950)
Monocytes Relative: 3 %
NEUTROS ABS: 13300 {cells}/uL — AB (ref 1500–7800)
Neutrophils Relative %: 76 %
Platelets: 368 10*3/uL (ref 140–400)
RBC: 6.6 MIL/uL — ABNORMAL HIGH (ref 3.80–5.10)
RDW: 21.1 % — ABNORMAL HIGH (ref 11.0–15.0)
WBC: 17.5 10*3/uL — ABNORMAL HIGH (ref 3.8–10.8)

## 2016-01-21 LAB — COMPREHENSIVE METABOLIC PANEL
ALBUMIN: 4.1 g/dL (ref 3.6–5.1)
ALK PHOS: 107 U/L (ref 33–130)
ALT: 11 U/L (ref 6–29)
AST: 13 U/L (ref 10–35)
BUN: 19 mg/dL (ref 7–25)
CHLORIDE: 100 mmol/L (ref 98–110)
CO2: 24 mmol/L (ref 20–31)
CREATININE: 0.83 mg/dL (ref 0.50–1.05)
Calcium: 10.2 mg/dL (ref 8.6–10.4)
Glucose, Bld: 139 mg/dL — ABNORMAL HIGH (ref 70–99)
POTASSIUM: 4.4 mmol/L (ref 3.5–5.3)
Sodium: 140 mmol/L (ref 135–146)
TOTAL PROTEIN: 6.8 g/dL (ref 6.1–8.1)
Total Bilirubin: 0.3 mg/dL (ref 0.2–1.2)

## 2016-01-21 LAB — LIPID PANEL
CHOL/HDL RATIO: 5.3 ratio — AB (ref <=5.0)
CHOLESTEROL: 165 mg/dL (ref 125–200)
HDL: 31 mg/dL — ABNORMAL LOW (ref >=46)
LDL Cholesterol: 87 mg/dL (ref <130)
TRIGLYCERIDES: 235 mg/dL — AB (ref <150)
VLDL: 47 mg/dL — AB (ref <30)

## 2016-01-21 MED ORDER — OXYCODONE-ACETAMINOPHEN 7.5-325 MG PO TABS: 1.0000 | ORAL_TABLET | Freq: Three times a day (TID) | ORAL | 0 refills | Status: DC | PRN

## 2016-01-21 NOTE — Progress Notes (Signed)
    Subjective:    Patient ID: Katelyn Cline, female    DOB: May 02, 1961, 55 y.o.   MRN: [id number]  Patient presents for 4 month F/U (is not fasting) Patient here for follow-up on chronic medical problems. She states that she has been more fatigued than normal she is followed asleep more during the day other she typically is up all night because her son works third shift and she tends to sleep anyway during the day. But feels like even her sleep pattern is soft. She was concerned maybe she wasn't getting enough oxygen through her cannula but states that they did come out to service her oxygen last month. She was asking to go back on Provigil.  Diabetes mellitus last A1c was 7.1% she states she is taking off her medications as prescribed her blood sugars have been 120s to 130s fasting per report. She denies any hypoglycemia.  She does have polycythemia vera she is being followed by hematology she is being phlebotomized every so often and is due to have one now because of her levels.  Chronic pain syndrome she continues to use her pain medicine as prescribed   Review Of Systems:  GEN- denies fatigue, fever, weight loss,weakness, recent illness HEENT- denies eye drainage, change in vision, nasal discharge, CVS- denies chest pain, palpitations RESP- denies SOB, cough, wheeze ABD- denies N/V, change in stools, abd pain GU- denies dysuria, hematuria, dribbling, incontinence MSK- +joint pain, muscle aches, injury Neuro- denies headache, dizziness, syncope, seizure activity       Objective:    BP 140/72 (BP Location: Left Arm, Patient Position: Sitting, Cuff Size: Large)   Pulse 86   Temp 98.7 F (37.1 C) (Oral)   Resp 20   Ht '5\' 7"'$  (1.702 m)   Wt 244 lb (110.7 kg)   SpO2 96% Comment: 3L/min via [place]  BMI 38.22 kg/m  GEN- NAD, alert and oriented x3 HEENT- PERRL, EOMI, non injected sclera, pink conjunctiva, MMM, oropharynx clear Neck- Supple, no thyromegaly  CVS- RRR, no  murmur RESP-CTAB, 2 L oxygen  EXT- No edema Pulses- Radial, DP- 2+        Assessment & Plan:      Problem List Items Addressed This Visit    Tobacco use disorder - Primary   Hyperlipidemia   Relevant Orders   Lipid panel   Essential hypertension, benign   Relevant Orders   Comprehensive metabolic panel   Diabetes mellitus type II, controlled ([place])   Relevant Orders   Hemoglobin A1c   CBC with Differential/Platelet    Other Visit Diagnoses    Other fatigue          Note: This dictation was prepared with Dragon dictation along with smaller phrase technology. Any transcriptional errors that result from this process are unintentional.

## 2016-01-21 NOTE — Patient Instructions (Signed)
Call [facility]  Continue current medications  We will call with lab results  Sleep study to be done F/U 4 months

## 2016-01-22 ENCOUNTER — Encounter: Payer: Self-pay | Admitting: Family Medicine

## 2016-01-22 LAB — HEMOGLOBIN A1C
Hgb A1c MFr Bld: 7.1 % — ABNORMAL HIGH (ref <5.7)
Mean Plasma Glucose: 157 mg/dL

## 2016-01-22 NOTE — Assessment & Plan Note (Signed)
Continues to smoke.

## 2016-01-22 NOTE — Assessment & Plan Note (Signed)
Blood pressure looks okay today no change to her meds

## 2016-01-22 NOTE — Assessment & Plan Note (Signed)
Diabetes has been under fairly good control we'll check her A1c today to make sure that nothing has changed with regards to her fatigue

## 2016-01-22 NOTE — Assessment & Plan Note (Signed)
She continues on oxygen therapy. It is possibly that she has some sleep apnea as well therefore get her set up for sleep study with this a T3 that she has been experiencing. We recently check labs including her thyroid function which was normal. This likely is multifactorial with her obesity her other comorbidities in her polycythemia

## 2016-01-24 ENCOUNTER — Telehealth: Payer: Self-pay | Admitting: Family Medicine

## 2016-01-24 NOTE — Telephone Encounter (Signed)
Trying to call patient.  Provider requested sleep study.  Wanted to have one done in the home.  I called [facility] (her oxygen provider)  And have sent them orders for some in home testing.  Dr Buelah Manis is aware and has signed the orders.  Huey Romans will be contacting her to set this up.

## 2016-01-28 DIAGNOSIS — J9611 Chronic respiratory failure with hypoxia: Secondary | ICD-10-CM | POA: Diagnosis not present

## 2016-01-28 DIAGNOSIS — C3490 Malignant neoplasm of unspecified part of unspecified bronchus or lung: Secondary | ICD-10-CM | POA: Diagnosis not present

## 2016-02-07 ENCOUNTER — Other Ambulatory Visit: Payer: Self-pay | Admitting: Family Medicine

## 2016-02-10 NOTE — Telephone Encounter (Signed)
Refill appropriate and filled per protocol. 

## 2016-02-13 DIAGNOSIS — J449 Chronic obstructive pulmonary disease, unspecified: Secondary | ICD-10-CM | POA: Diagnosis not present

## 2016-02-13 IMAGING — CT CT ABD-PELV W/O CM
2 of 4 series · 15 of 36 positions shown, 18 images · non-contrast
Comparison: Multiple exams, including 08/29/2014

CLINICAL DATA: Follow up stage IV metastatic melanoma. Metastatic
disease to the lungs.

EXAM:
CT CHEST, ABDOMEN AND PELVIS WITHOUT CONTRAST
TECHNIQUE: Multidetector CT imaging of the chest, abdomen and pelvis was
performed following the standard protocol without IV contrast.

[Series 2: cap w/o 5.0 b40f · axial · non-contrast · 0.98mm/px · z∈[-676,-91]mm · 12 of 133 slices shown, 15 images]
[im 8/133  mediastinal]
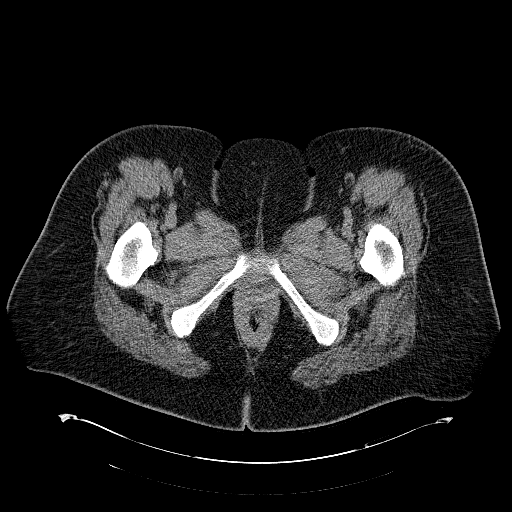
[im 8/133  lung]
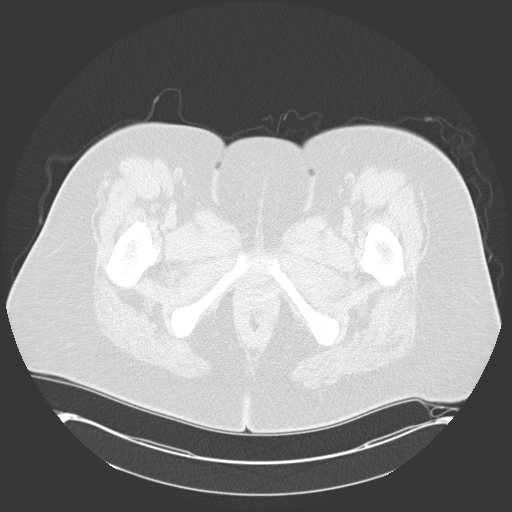
[im 23/133  lung]
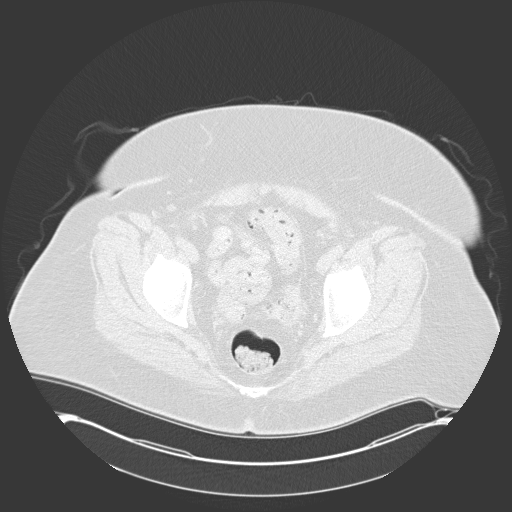
[im 30/133  lung]
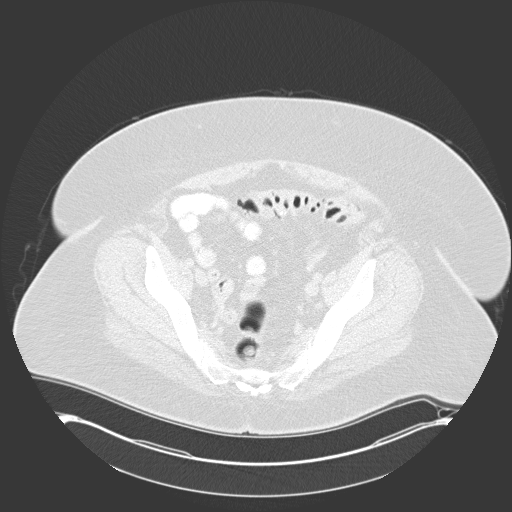
[im 37/133  lung]
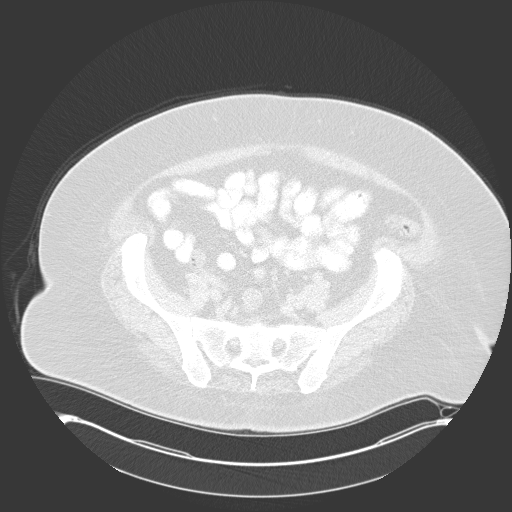
[im 52/133  mediastinal]
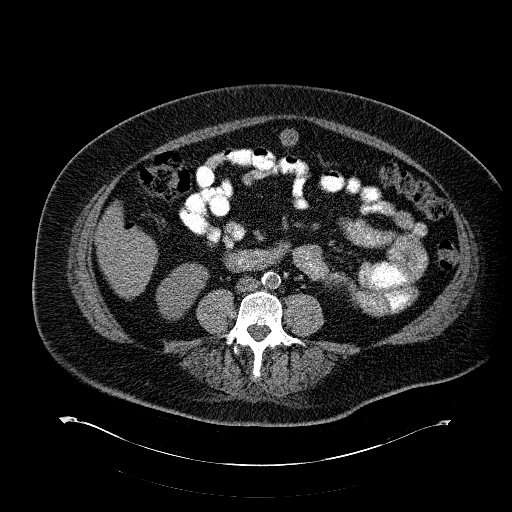
[im 52/133  lung]
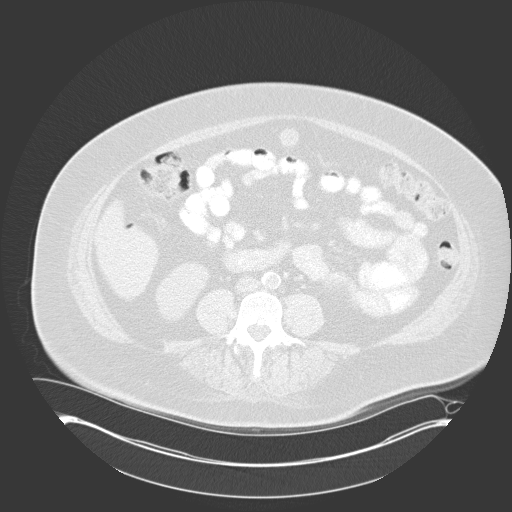
[im 59/133  lung]
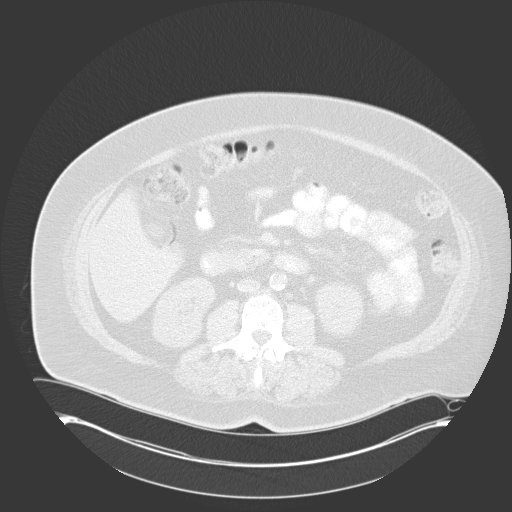
[im 74/133  lung]
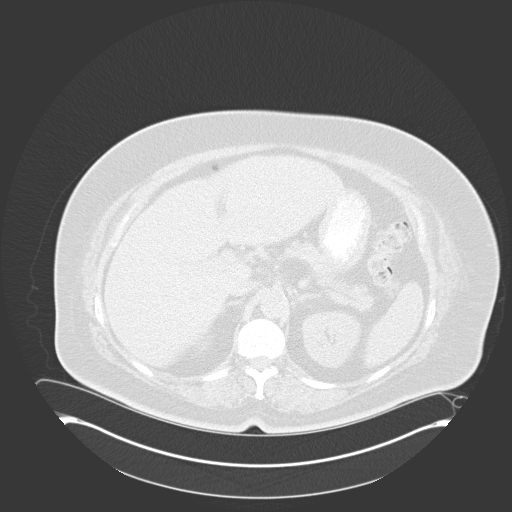
[im 81/133  lung]
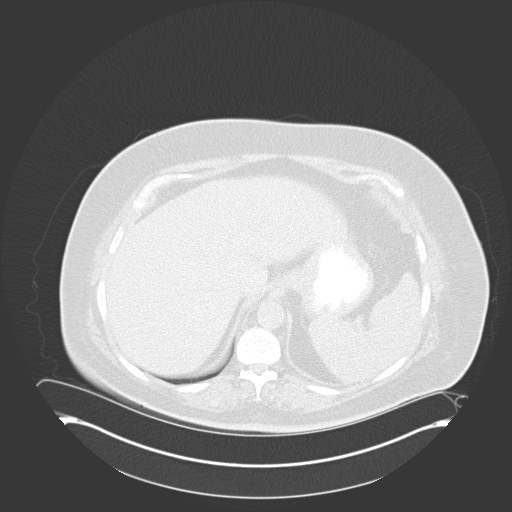
[im 96/133  mediastinal]
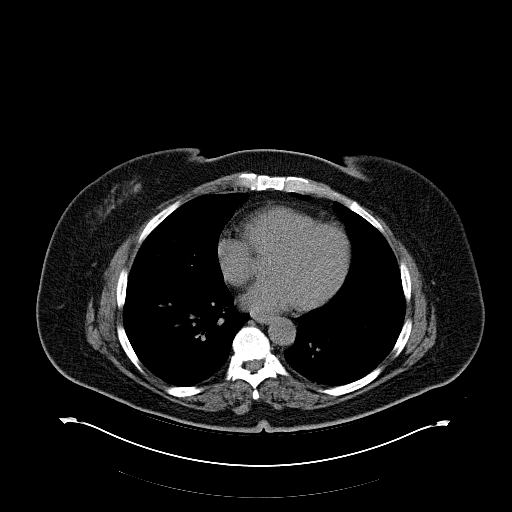
[im 96/133  lung]
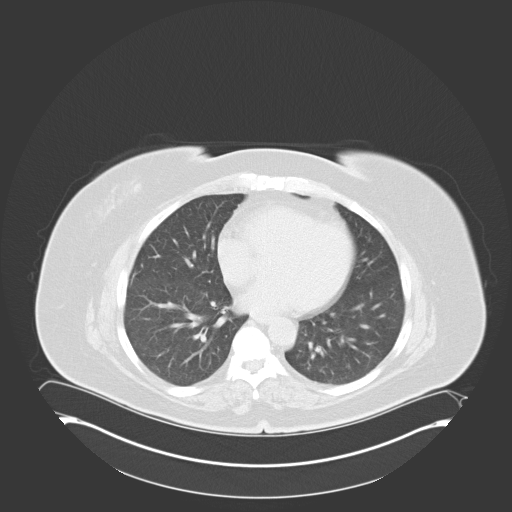
[im 103/133  lung]
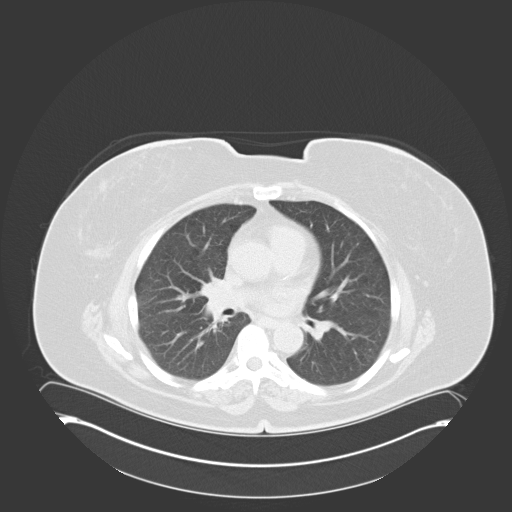
[im 111/133  lung]
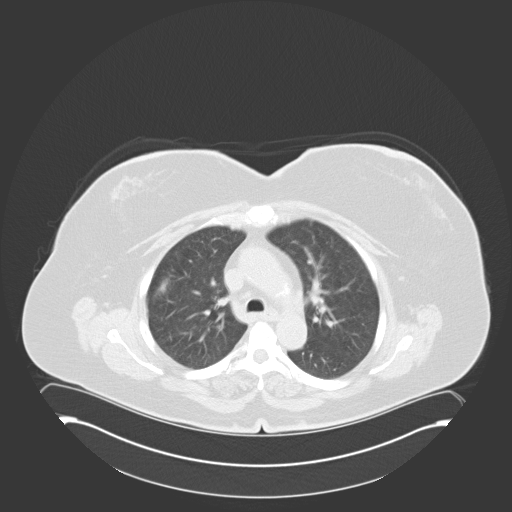
[im 125/133  lung]
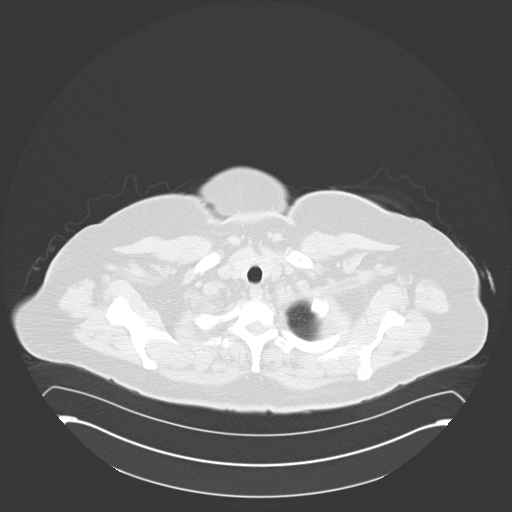

[Series 3: mpr coro cap (id) · coronal · 0.99mm/px · 3 of 120 slices shown]
[im 24/120  lung]
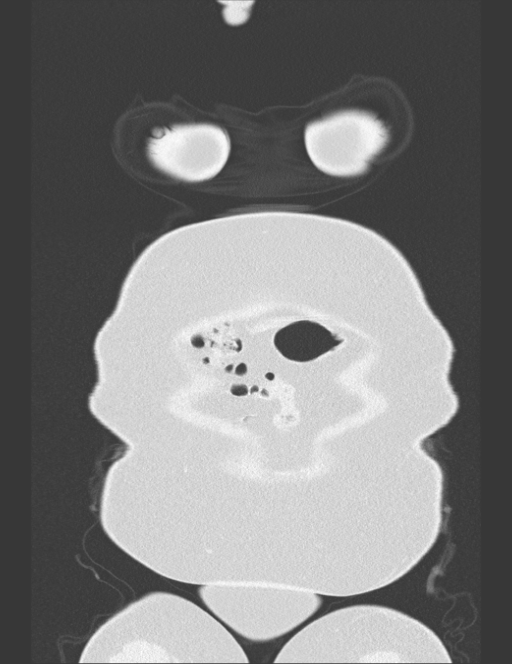
[im 48/120  lung]
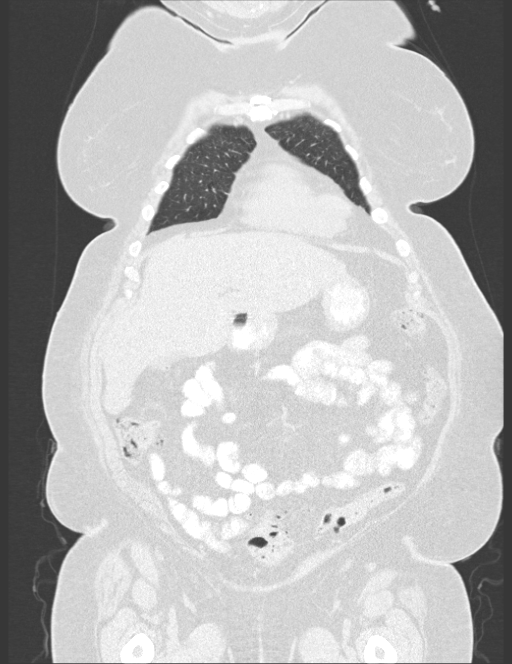
[im 72/120  lung]
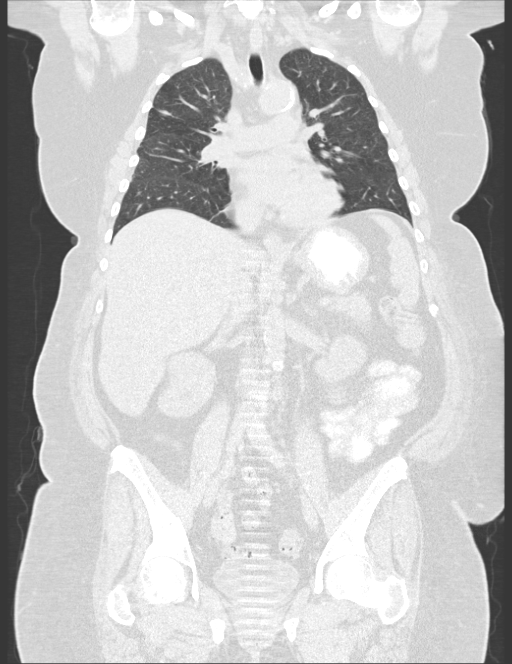

[15 of 36 positions shown; findings below may reference images not displayed]

FINDINGS: CT CHEST FINDINGS

Mediastinum/Nodes: Aortic, branch vessel, and coronary artery
atherosclerosis. AP window lymph node 0.9 cm in short axis,
previously 0.7 cm.

Lungs/Pleura: Stable scarring, right upper lobe. Stable 4 mm nodule,
left upper lobe, image 28 series 6.

Posterior basal segment left lower lobe nodule subpleural in
location, 0.6 cm by 0.5 cm in diameter, essentially stable.

Musculoskeletal: Suspected hemangioma in the T3 vertebral body.

CT ABDOMEN PELVIS FINDINGS

Hepatobiliary: Unremarkable noncontrast CT appearance.

Pancreas: Unremarkable

Spleen: Unremarkable

Adrenals/Urinary Tract: Unremarkable

Stomach/Bowel: Unremarkable

Vascular/Lymphatic: Aortoiliac atherosclerotic vascular disease.

Reproductive: Uterus absent.  Ovaries normal.

Other: No supplemental non-categorized findings.

Musculoskeletal: Degenerative disc disease and posterior osseous
ridging at L5-S1.
IMPRESSION: 1. Small left lung nodules stable from 7939. Postoperative findings
in the right upper lobe.
2. Atherosclerosis.

## 2016-02-20 ENCOUNTER — Telehealth: Payer: Self-pay | Admitting: *Deleted

## 2016-02-20 NOTE — Telephone Encounter (Signed)
Records indicate prescription refill appropriate for Percocet.  Ok to refill??  Last office visit 01/21/2016.  Last refill 01/21/2016.

## 2016-02-21 MED ORDER — OXYCODONE-ACETAMINOPHEN 7.5-325 MG PO TABS: 1.0000 | ORAL_TABLET | Freq: Three times a day (TID) | ORAL | 0 refills | Status: DC | PRN

## 2016-02-21 NOTE — Telephone Encounter (Signed)
Prescription printed and patient made aware to come to office to pick up after 2 pm on 02/21/2016.

## 2016-02-21 NOTE — Telephone Encounter (Signed)
okay

## 2016-02-23 ENCOUNTER — Other Ambulatory Visit: Payer: Self-pay | Admitting: Family Medicine

## 2016-02-28 DIAGNOSIS — C3490 Malignant neoplasm of unspecified part of unspecified bronchus or lung: Secondary | ICD-10-CM | POA: Diagnosis not present

## 2016-02-28 DIAGNOSIS — J9611 Chronic respiratory failure with hypoxia: Secondary | ICD-10-CM | POA: Diagnosis not present

## 2016-03-06 ENCOUNTER — Other Ambulatory Visit: Payer: Self-pay | Admitting: Family Medicine

## 2016-03-18 ENCOUNTER — Other Ambulatory Visit: Payer: Self-pay | Admitting: Family Medicine

## 2016-03-29 DIAGNOSIS — J9611 Chronic respiratory failure with hypoxia: Secondary | ICD-10-CM | POA: Diagnosis not present

## 2016-03-29 DIAGNOSIS — C3490 Malignant neoplasm of unspecified part of unspecified bronchus or lung: Secondary | ICD-10-CM | POA: Diagnosis not present

## 2016-03-31 ENCOUNTER — Telehealth: Payer: Self-pay | Admitting: Family Medicine

## 2016-03-31 NOTE — Telephone Encounter (Signed)
Patient would like rx for percocet  [phone]

## 2016-03-31 NOTE — Telephone Encounter (Signed)
Okay to refill? 

## 2016-03-31 NOTE — Telephone Encounter (Signed)
Ok to refill??  Last office visit 01/21/2016.  Last refill 02/21/2016.

## 2016-04-01 MED ORDER — OXYCODONE-ACETAMINOPHEN 7.5-325 MG PO TABS: 1.0000 | ORAL_TABLET | Freq: Three times a day (TID) | ORAL | 0 refills | Status: DC | PRN

## 2016-04-01 NOTE — Telephone Encounter (Signed)
Prescription printed and patient made aware to come to office to pick up after 2pm on 04/01/2016

## 2016-04-03 ENCOUNTER — Other Ambulatory Visit: Payer: Self-pay | Admitting: Family Medicine

## 2016-04-10 ENCOUNTER — Other Ambulatory Visit (HOSPITAL_COMMUNITY): Payer: Self-pay | Admitting: Oncology

## 2016-04-10 DIAGNOSIS — C799 Secondary malignant neoplasm of unspecified site: Secondary | ICD-10-CM

## 2016-04-10 DIAGNOSIS — C439 Malignant melanoma of skin, unspecified: Secondary | ICD-10-CM

## 2016-04-13 ENCOUNTER — Ambulatory Visit (HOSPITAL_COMMUNITY): Payer: Commercial Managed Care - HMO

## 2016-04-15 ENCOUNTER — Ambulatory Visit (HOSPITAL_COMMUNITY): Payer: Self-pay | Admitting: Hematology & Oncology

## 2016-04-15 ENCOUNTER — Other Ambulatory Visit (HOSPITAL_COMMUNITY): Payer: Self-pay

## 2016-04-28 ENCOUNTER — Encounter (HOSPITAL_COMMUNITY): Payer: Commercial Managed Care - HMO | Attending: Hematology & Oncology

## 2016-04-28 ENCOUNTER — Telehealth: Payer: Self-pay | Admitting: Family Medicine

## 2016-04-28 DIAGNOSIS — D45 Polycythemia vera: Secondary | ICD-10-CM | POA: Diagnosis not present

## 2016-04-28 LAB — CBC
HEMATOCRIT: 49.5 % — AB (ref 36.0–46.0)
HEMOGLOBIN: 14.7 g/dL (ref 12.0–15.0)
MCH: 20.7 pg — AB (ref 26.0–34.0)
MCHC: 29.7 g/dL — AB (ref 30.0–36.0)
MCV: 69.6 fL — ABNORMAL LOW (ref 78.0–100.0)
Platelets: 419 10*3/uL — ABNORMAL HIGH (ref 150–400)
RBC: 7.11 MIL/uL — ABNORMAL HIGH (ref 3.87–5.11)
RDW: 21.5 % — ABNORMAL HIGH (ref 11.5–15.5)
WBC: 18.6 10*3/uL — ABNORMAL HIGH (ref 4.0–10.5)

## 2016-04-28 MED ORDER — OXYCODONE-ACETAMINOPHEN 7.5-325 MG PO TABS: 1.0000 | ORAL_TABLET | Freq: Three times a day (TID) | ORAL | 0 refills | Status: DC | PRN

## 2016-04-28 NOTE — Telephone Encounter (Signed)
Ok to refill??  Last office visit 01/21/2016.  Last refill 04/01/2016.

## 2016-04-28 NOTE — Telephone Encounter (Signed)
Prescription printed and patient made aware to come to office to pick up 04/29/2016.

## 2016-04-28 NOTE — Telephone Encounter (Signed)
Okay to refill? 

## 2016-04-28 NOTE — Telephone Encounter (Signed)
Patient is calling to get rx for her oxycodone  [phone]

## 2016-04-29 DIAGNOSIS — C3490 Malignant neoplasm of unspecified part of unspecified bronchus or lung: Secondary | ICD-10-CM | POA: Diagnosis not present

## 2016-04-29 DIAGNOSIS — J9611 Chronic respiratory failure with hypoxia: Secondary | ICD-10-CM | POA: Diagnosis not present

## 2016-05-01 ENCOUNTER — Other Ambulatory Visit (HOSPITAL_COMMUNITY): Payer: Self-pay | Admitting: *Deleted

## 2016-05-01 ENCOUNTER — Other Ambulatory Visit: Payer: Self-pay | Admitting: Family Medicine

## 2016-05-01 DIAGNOSIS — D45 Polycythemia vera: Secondary | ICD-10-CM

## 2016-05-04 NOTE — Telephone Encounter (Signed)
Medication refilled per protocol. 

## 2016-05-06 ENCOUNTER — Encounter (HOSPITAL_COMMUNITY): Payer: Self-pay

## 2016-05-06 ENCOUNTER — Encounter (HOSPITAL_COMMUNITY)
Admission: RE | Admit: 2016-05-06 | Discharge: 2016-05-06 | Disposition: A | Discharge status: Home / Self Care | Payer: Commercial Managed Care - HMO | Source: Ambulatory Visit | Attending: Hematology & Oncology | Admitting: Hematology & Oncology

## 2016-05-06 ENCOUNTER — Ambulatory Visit (HOSPITAL_COMMUNITY)
Admission: RE | Admit: 2016-05-06 | Discharge: 2016-05-06 | Disposition: A | Discharge status: Home / Self Care | Payer: Commercial Managed Care - HMO | Source: Ambulatory Visit | Attending: Oncology | Admitting: Oncology

## 2016-05-06 DIAGNOSIS — D45 Polycythemia vera: Secondary | ICD-10-CM | POA: Diagnosis not present

## 2016-05-06 DIAGNOSIS — K5730 Diverticulosis of large intestine without perforation or abscess without bleeding: Secondary | ICD-10-CM | POA: Insufficient documentation

## 2016-05-06 DIAGNOSIS — R222 Localized swelling, mass and lump, trunk: Secondary | ICD-10-CM | POA: Diagnosis not present

## 2016-05-06 DIAGNOSIS — I2510 Atherosclerotic heart disease of native coronary artery without angina pectoris: Secondary | ICD-10-CM | POA: Diagnosis not present

## 2016-05-06 DIAGNOSIS — R918 Other nonspecific abnormal finding of lung field: Secondary | ICD-10-CM | POA: Insufficient documentation

## 2016-05-06 DIAGNOSIS — I700 Atherosclerosis of aorta: Secondary | ICD-10-CM | POA: Insufficient documentation

## 2016-05-06 DIAGNOSIS — C439 Malignant melanoma of skin, unspecified: Secondary | ICD-10-CM

## 2016-05-06 DIAGNOSIS — C799 Secondary malignant neoplasm of unspecified site: Secondary | ICD-10-CM | POA: Insufficient documentation

## 2016-05-06 DIAGNOSIS — C7800 Secondary malignant neoplasm of unspecified lung: Secondary | ICD-10-CM | POA: Diagnosis not present

## 2016-05-06 DIAGNOSIS — C7801 Secondary malignant neoplasm of right lung: Secondary | ICD-10-CM | POA: Diagnosis not present

## 2016-05-06 NOTE — Progress Notes (Signed)
Removed 1lb .42 ounces in 16 minutes. Patient tolerated procedure well.

## 2016-05-13 ENCOUNTER — Encounter (HOSPITAL_COMMUNITY): Payer: Self-pay | Admitting: Hematology & Oncology

## 2016-05-13 ENCOUNTER — Encounter (HOSPITAL_COMMUNITY): Payer: Self-pay

## 2016-05-13 ENCOUNTER — Encounter (HOSPITAL_COMMUNITY): Payer: Commercial Managed Care - HMO | Attending: Hematology & Oncology | Admitting: Hematology & Oncology

## 2016-05-13 ENCOUNTER — Encounter (HOSPITAL_COMMUNITY): Payer: Commercial Managed Care - HMO

## 2016-05-13 VITALS — BP 156/55 | HR 79 | Temp 98.7°F | Resp 20 | Wt 246.8 lb

## 2016-05-13 DIAGNOSIS — R918 Other nonspecific abnormal finding of lung field: Secondary | ICD-10-CM | POA: Diagnosis not present

## 2016-05-13 DIAGNOSIS — Z880 Allergy status to penicillin: Secondary | ICD-10-CM | POA: Diagnosis not present

## 2016-05-13 DIAGNOSIS — Z1589 Genetic susceptibility to other disease: Secondary | ICD-10-CM

## 2016-05-13 DIAGNOSIS — F410 Panic disorder [episodic paroxysmal anxiety] without agoraphobia: Secondary | ICD-10-CM | POA: Diagnosis not present

## 2016-05-13 DIAGNOSIS — Z1239 Encounter for other screening for malignant neoplasm of breast: Secondary | ICD-10-CM

## 2016-05-13 DIAGNOSIS — Z85820 Personal history of malignant melanoma of skin: Secondary | ICD-10-CM

## 2016-05-13 DIAGNOSIS — Z91041 Radiographic dye allergy status: Secondary | ICD-10-CM | POA: Insufficient documentation

## 2016-05-13 DIAGNOSIS — Z23 Encounter for immunization: Secondary | ICD-10-CM

## 2016-05-13 DIAGNOSIS — C439 Malignant melanoma of skin, unspecified: Secondary | ICD-10-CM

## 2016-05-13 DIAGNOSIS — D45 Polycythemia vera: Secondary | ICD-10-CM | POA: Insufficient documentation

## 2016-05-13 DIAGNOSIS — Z0000 Encounter for general adult medical examination without abnormal findings: Secondary | ICD-10-CM

## 2016-05-13 DIAGNOSIS — Z87891 Personal history of nicotine dependence: Secondary | ICD-10-CM | POA: Diagnosis not present

## 2016-05-13 DIAGNOSIS — C799 Secondary malignant neoplasm of unspecified site: Secondary | ICD-10-CM | POA: Diagnosis not present

## 2016-05-13 DIAGNOSIS — Z9114 Patient's other noncompliance with medication regimen: Secondary | ICD-10-CM | POA: Diagnosis not present

## 2016-05-13 DIAGNOSIS — Z9119 Patient's noncompliance with other medical treatment and regimen: Secondary | ICD-10-CM | POA: Insufficient documentation

## 2016-05-13 DIAGNOSIS — Z9109 Other allergy status, other than to drugs and biological substances: Secondary | ICD-10-CM | POA: Diagnosis not present

## 2016-05-13 DIAGNOSIS — Z91040 Latex allergy status: Secondary | ICD-10-CM | POA: Insufficient documentation

## 2016-05-13 DIAGNOSIS — J329 Chronic sinusitis, unspecified: Secondary | ICD-10-CM | POA: Diagnosis not present

## 2016-05-13 LAB — CBC
HCT: 47.5 % — ABNORMAL HIGH (ref 36.0–46.0)
Hemoglobin: 14 g/dL (ref 12.0–15.0)
MCH: 19.9 pg — AB (ref 26.0–34.0)
MCHC: 29.5 g/dL — AB (ref 30.0–36.0)
MCV: 67.7 fL — AB (ref 78.0–100.0)
PLATELETS: 406 10*3/uL — AB (ref 150–400)
RBC: 7.02 MIL/uL — ABNORMAL HIGH (ref 3.87–5.11)
RDW: 23.6 % — ABNORMAL HIGH (ref 11.5–15.5)
WBC: 19.7 10*3/uL — ABNORMAL HIGH (ref 4.0–10.5)

## 2016-05-13 MED ORDER — INFLUENZA VAC SPLIT QUAD 0.5 ML IM SUSY
0.5000 mL | PREFILLED_SYRINGE | Freq: Once | INTRAMUSCULAR | Status: AC
  Administered 2016-05-13: 0.5 mL via INTRAMUSCULAR

## 2016-05-13 MED ORDER — INFLUENZA VAC SPLIT QUAD 0.5 ML IM SUSY
PREFILLED_SYRINGE | INTRAMUSCULAR | Status: AC
  Filled 2016-05-13: qty 0.5

## 2016-05-13 NOTE — Progress Notes (Signed)
[facility], [place], MD [address] [place] [address]    DIAGNOSIS: Metastatic melanoma, BRAF mutation negative             Right lung resection by Dr. Servando Snare on 10/21/2012             JAK 2 positive P. Vera                 SUMMARY OF ONCOLOGIC HISTORY:   Polycythemia vera ([place])   08/05/2012 Initial Diagnosis    Polycythemia vera ([place])       Metastatic melanoma ([facility])   10/08/2015 Imaging    CT CAP- Pulmonary nodules are stable to slightly decreased in size. No new sites of metastatic disease in the chest, abdomen or pelvis.       05/06/2016 Imaging    CT CAP- Stable patchy centrilobular ground-glass micronodularity throughout both lungs, upper lung predominant. Stable mosaic attenuation throughout both lungs. This combination of findings raises the possibility of subacute hypersensitivity pneumonitis. However, a few scattered solid pulmonary nodules measuring up to 7 mm in the left lower lobe are new or slightly increased in size, and pulmonary metastases cannot be excluded given this patient's history of pulmonary metastatic melanoma. These nodules are below PET resolution. Continued chest CT surveillance is warranted in 3-6 months.       CURRENT THERAPY: Observation        Phlebotomy   INTERVAL HISTORY: Katelyn Cline 55 y.o. female returns for follow-up of a history of stage IV melanoma, primary site not identified and polycythemia vera.   Katelyn Cline returns to the [place] alone today. She has a nasal cannula in place for oxygen and uses a cane.  She returns today for follow-up of CT imaging. I have reviewed the scans with the patient.   She is not sure if she's had her flu shot. She says that she has a visit with her PCP coming up soon.   She has a sinus infection that she has not been able to get over. She does not want any antibiotics. She denies a fever or new or worsening cough.    She has not had her mammogram this year. She notes however that she is open to getting one.   She has never seen a cardiologist.  She denies any abdominal pain. She states she has nausea from the metformin.  She experiences panic attacks.   She did go for phlebotomy but states that they were unable to remove a full unit of blood. No headaches, blurry vision.   MEDICAL HISTORY: Past Medical History:  Diagnosis Date  . Agoraphobia   . Basal cell carcinoma   . Chronic pain   . Chronic respiratory failure with hypoxia ([name])    3L [place]  . DM type 2 (diabetes mellitus, type 2) ([place]) 10/16/2013  . Dysrhythmia    Hx: of palpitations "a long time ago"  . Elevated hemoglobin ([place]) 2014  . Fibromyalgia   . History of UTI   . HTN (hypertension)   . Hyperlipidemia   . Major depression   . Metastatic melanoma ([place]) 10/18/2012   12 mm posterior right upper lobe pulmonary nodule, max SUV 3.0    . Migraines   . Miscarriage    x 4  . Narcolepsy   . Panic attacks   . Peripheral neuropathy ([place])   . Peripheral neuropathy ([place])  in feet  . Polycythemia secondary to smoking   . Polycythemia vera(238.4)   . Scoliosis   . Shortness of breath    Hx: of with activity  . Sleep apnea   . Vertigo     has DDD (degenerative disc disease), lumbar; DDD (degenerative disc disease), cervical; Chronic pain disorder; Depression; Fibromyalgia; Hyperlipidemia; Narcolepsy; Peripheral neuropathy ([place]); Obesity; Essential hypertension, benign; Non-healing skin lesion of nose; Polycythemia vera ([place]); Polycythemia secondary to smoking; Metastatic melanoma ([place]); Diabetes mellitus type II, controlled ([place]); Chronic hypoxemic respiratory failure ([place]); Basal cell carcinoma of skin; Posttraumatic stress disorder; MRSA (methicillin resistant staph aureus) culture positive; Gastroenteritis; PVC (premature ventricular contraction); Seborrheic keratoses; OE (otitis externa); and Constipation on her problem list.      is allergic to contrast media [iodinated diagnostic agents]; erythromycin; flagyl [metronidazole]; latex; other; penicillins; and tetracyclines & related.  Katelyn Cline does not currently have medications on file.  SURGICAL HISTORY: Past Surgical History:  Procedure Laterality Date  . BASAL CELL CARCINOMA EXCISION     flap surgery on face  . BREAST LUMPECTOMY     bilateral  . COLONOSCOPY W/ BIOPSIES AND POLYPECTOMY     Hx: of  . CYSTECTOMY     abdominal wall   . DILATION AND CURETTAGE OF UTERUS     Hx: of   . VAGINAL HYSTERECTOMY    . VIDEO ASSISTED THORACOSCOPY (VATS)/WEDGE RESECTION Right 10/21/2012   Procedure: VIDEO ASSISTED THORACOSCOPY (VATS)/WEDGE RESECTION;  Surgeon: [name], MD;  Location: [facility];  Service: Thoracic;  Laterality: Right;  Marland Kitchen VIDEO BRONCHOSCOPY N/A 10/21/2012   Procedure: VIDEO BRONCHOSCOPY;  Surgeon: [name], MD;  Location: [facility];  Service: Thoracic;  Laterality: N/A;    SOCIAL HISTORY: Social History   Social History  . Marital status: Divorced    Spouse name: N/A  . Number of children: N/A  . Years of education: 12+   Occupational History  . Not on file.   Social History Main Topics  . Smoking status: Former Smoker    Packs/day: 2.00    Years: 5.00    Types: Cigarettes    Quit date: 09/16/2012  . Smokeless tobacco: Never Used  . Alcohol use Yes     Comment: 1 -2 drinks a month, occasional  . Drug use: No  . Sexual activity: Not Currently   Other Topics Concern  . Not on file   Social History Narrative  . No narrative on file    FAMILY HISTORY: Family History  Problem Relation Age of Onset  . Arthritis Mother   . Hyperlipidemia Mother   . Depression Mother   . Anxiety disorder Mother   . Dementia Mother   . Hypertension Father   . Hyperlipidemia Father   . Heart disease Father   . Stroke Father   . Dementia Father   . Heart disease Brother   . ADD / ADHD Son   . Alcohol abuse Maternal Grandfather   .  Bipolar disorder Neg Hx   . Drug abuse Neg Hx   . OCD Neg Hx   . Paranoid behavior Neg Hx   . Schizophrenia Neg Hx   . Seizures Neg Hx   . Sexual abuse Neg Hx   . Physical abuse Neg Hx     Review of Systems  Constitutional: Negative.  Negative for fever.  HENT: Negative.        Sinus infection.  Eyes: Negative.   Respiratory: Negative.   Cardiovascular: Negative.  Gastrointestinal: Positive for nausea. Negative for abdominal pain.       Nausea secondary to metformin.  Genitourinary: Negative.   Musculoskeletal: Positive for back pain and joint pain.       Hip and back pain - uses a cane  Skin: Negative.   Neurological: Negative.   Endo/Heme/Allergies: Negative.   Psychiatric/Behavioral: Negative.        Panic attacks.   All other systems reviewed and are negative. 14 point review of systems was performed and is negative except as detailed under history of present illness and above   PHYSICAL EXAMINATION ECOG PERFORMANCE STATUS: 1 - Symptomatic but completely ambulatory  Vitals:   05/13/16 1037  BP: (!) 156/55  Pulse: 79  Resp: 20  Temp: 98.7 F (37.1 C)    Physical Exam  Constitutional: She is oriented to person, place, and time and well-developed, well-nourished, and in no distress.  Patient uses oxygen. Was able to get on exam table without assistance.  Wears glasses.  HENT:  Head: Normocephalic and atraumatic.  Eyes: EOM are normal. Pupils are equal, round, and reactive to light.  Neck: Normal range of motion. Neck supple.  Cardiovascular: Normal rate and normal heart sounds.   Ectopy.   Pulmonary/Chest: Effort normal and breath sounds normal.  Abdominal: Soft. Bowel sounds are normal.  Musculoskeletal: Normal range of motion.  Neurological: She is alert and oriented to person, place, and time. Gait normal.  Skin: Skin is warm and dry.  Nursing note and vitals reviewed. 14 point review of systems was performed and is negative except as detailed under  history of present illness and above    LABORATORY DATA: I have reviewed the data as listed.   CBC    Component Value Date/Time   WBC 18.6 (H) 04/28/2016 0930   RBC 7.11 (H) 04/28/2016 0930   HGB 14.7 04/28/2016 0930   HCT 49.5 (H) 04/28/2016 0930   PLT 419 (H) 04/28/2016 0930   MCV 69.6 (L) 04/28/2016 0930   MCH 20.7 (L) 04/28/2016 0930   MCHC 29.7 (L) 04/28/2016 0930   RDW 21.5 (H) 04/28/2016 0930   LYMPHSABS 3,325 01/21/2016 0847   MONOABS 525 01/21/2016 0847   EOSABS 350 01/21/2016 0847   BASOSABS 0 01/21/2016 0847   CMP     Component Value Date/Time   NA 140 01/21/2016 0847   K 4.4 01/21/2016 0847   CL 100 01/21/2016 0847   CO2 24 01/21/2016 0847   GLUCOSE 139 (H) 01/21/2016 0847   BUN 19 01/21/2016 0847   CREATININE 0.83 01/21/2016 0847   CALCIUM 10.2 01/21/2016 0847   PROT 6.8 01/21/2016 0847   ALBUMIN 4.1 01/21/2016 0847   AST 13 01/21/2016 0847   ALT 11 01/21/2016 0847   ALKPHOS 107 01/21/2016 0847   BILITOT 0.3 01/21/2016 0847   GFRNONAA >60 10/08/2015 0925   GFRAA >60 10/08/2015 0925    RADIOLOGY: I have reviewed the images below and agree with the report  Study Result   CLINICAL DATA:  Stage IV metastatic melanoma based on resection of right upper lobe pulmonary metastasis 10/18/2012, presenting for restaging.  EXAM: CT CHEST, ABDOMEN AND PELVIS WITHOUT CONTRAST  TECHNIQUE: Multidetector CT imaging of the chest, abdomen and pelvis was performed following the standard protocol without IV contrast.  COMPARISON:  10/08/2015 CT chest, abdomen and pelvis.  FINDINGS: CT CHEST FINDINGS  Cardiovascular: Normal heart size. No significant pericardial fluid/thickening. Left main, left anterior descending, left circumflex and right coronary atherosclerosis. Atherosclerotic nonaneurysmal thoracic  aorta. Normal caliber pulmonary arteries.  Mediastinum/Nodes: Stable hypodense 0.8 cm anterior left thyroid lobe nodule. Unremarkable esophagus.  No pathologically enlarged axillary, mediastinal or gross hilar lymph nodes, noting limited sensitivity for the detection of hilar adenopathy on this noncontrast study.  Lungs/Pleura: No pneumothorax. No pleural effusion. No acute consolidative airspace disease or lung masses. There is a stable mosaic attenuation throughout both lungs. Patchy centrilobular ground-glass micronodularity throughout both lungs, upper lung predominant, not definitely changed accounting for differences in slice thickness. A few scattered solid pulmonary nodules in both lungs are new or slightly increased in size, for example a new 5 mm subpleural apical left upper lobe pulmonary nodule (series 3/ image 18), a 5 mm peripheral left upper lobe pulmonary nodule (series 3/ image 57) increased from 3 mm, and a 7 mm subpleural posterior basilar left lower lobe pulmonary nodule (series 3/ image 97) minimally increased from 6 mm. Stable scarring in the right upper lobe.  Musculoskeletal: No aggressive appearing focal osseous lesions. Mild thoracic spondylosis. Stable superficial subcutaneous nodules in the medial back bilaterally, largest 12 mm, stable (series 2/ image 16).  CT ABDOMEN PELVIS FINDINGS  Hepatobiliary: Normal liver with no liver mass. Normal gallbladder with no radiopaque cholelithiasis. No biliary ductal dilatation.  Pancreas: Normal, with no mass or duct dilation.  Spleen: Normal size. No mass.  Adrenals/Urinary Tract: Normal adrenals. No hydronephrosis. No renal stones. No contour deforming renal mass. Normal bladder.  Stomach/Bowel: Grossly normal stomach. Normal caliber small bowel with no small bowel wall thickening. Normal appendix. Moderate sigmoid diverticulosis, with no large bowel wall thickening or pericolonic fat stranding.  Vascular/Lymphatic: Atherosclerotic nonaneurysmal abdominal aorta. No pathologically enlarged lymph nodes in the abdomen or  pelvis.  Reproductive: Status post hysterectomy, with no abnormal findings at the vaginal cuff. No adnexal mass.  Other: No pneumoperitoneum, ascites or focal fluid collection. Stable small fat containing umbilical hernia.  Musculoskeletal: No aggressive appearing focal osseous lesions. Moderate lumbar degenerative disc disease, most prominent at L5-S1.  IMPRESSION: 1. Stable patchy centrilobular ground-glass micronodularity throughout both lungs, upper lung predominant. Stable mosaic attenuation throughout both lungs. This combination of findings raises the possibility of subacute hypersensitivity pneumonitis. However, a few scattered solid pulmonary nodules measuring up to 7 mm in the left lower lobe are new or slightly increased in size, and pulmonary metastases cannot be excluded given this patient's history of pulmonary metastatic melanoma. These nodules are below PET resolution. Continued chest CT surveillance is warranted in 3-6 months. 2. Otherwise no potential sites of metastatic disease in the chest, abdomen or pelvis. 3. Nonspecific stable superficial subcutaneous nodules in the medial back bilaterally, correlate with skin exam. 4. Additional findings include aortic atherosclerosis, left main and 3 vessel coronary atherosclerosis and sigmoid diverticulosis.   Electronically Signed   By: Ilona Sorrel M.D.   On: 05/06/2016 11:08     ASSESSMENT and THERAPY PLAN:  JAK 2 positive Polycythemia Vera Secondary Erythrocytosis Stage IV melanoma  Pulmonary nodules  Polycythemia vera JAK2 positive, polycythemia vera.  Managed with therapeutic phlebotomies. She is intermittently non-compliant.  Goal HCT < 45.  She has periods where she remains compliant and periods where she does not.  I emphasized the importance of preventing disease related complications through maintaining HCT < 45 such as stroke, MI and other thromboembolic complications. She is agreeable to  rechecking her CBC today.    Metastatic melanoma CT imaging was reviewed with the patient in detail. We will continue with ongoing observation. Scans will be obtained again  in 3 months, as per radiology recommendations. .  Screening Mammography ordered.   She will get her flu shot today.   She will return for a follow up in 3 months after her CT scan.   Orders Placed This Encounter  Procedures  . CT Chest Wo Contrast    Standing Status:   Future    Standing Expiration Date:   05/13/2017    Order Specific Question:   Reason for Exam (SYMPTOM  OR DIAGNOSIS REQUIRED)    Answer:   follow-up pulmonary nodules, history melanoma    Order Specific Question:   Is patient pregnant?    Answer:   No    Order Specific Question:   Preferred imaging location?    Answer:   [facility]   All questions were answered. The patient knows to call the clinic with any problems, questions or concerns. We can certainly see the patient much sooner if necessary.  This document serves as a record of services personally performed by Ancil Linsey, MD. It was created on her behalf by Martinique Casey, a trained medical scribe. The creation of this record is based on the scribe's personal observations and the provider's statements to them. This document has been checked and approved by the attending provider.  This note was electronically signed.  I have reviewed the above documentation for accuracy and completeness, and I agree with the above. Molli Hazard, MD

## 2016-05-13 NOTE — Patient Instructions (Addendum)
[facility] [facility] at [facility] Discharge Instructions  RECOMMENDATIONS MADE BY THE CONSULTANT AND ANY TEST RESULTS WILL BE SENT TO YOUR REFERRING PHYSICIAN.  You saw Dr.Penland today. Chest CT in 3 months. Mammogram soon. Follow up in 3 months for CT results and lab work same day. Flu Shot today. See Amy at checkout for appointments.  Thank you for choosing [facility] at [facility] to provide your oncology and hematology care.  To afford each patient quality time with our provider, please arrive at least 15 minutes before your scheduled appointment time.   Beginning January 23rd 2017 lab work for the [facility] will be done in the  Main lab at [facility] on 1st floor. If you have a lab appointment with the [facility] please come in thru the  Main Entrance and check in at the main information desk  You need to re-schedule your appointment should you arrive 10 or more minutes late.  We strive to give you quality time with our providers, and arriving late affects you and other patients whose appointments are after yours.  Also, if you no show three or more times for appointments you may be dismissed from the clinic at the providers discretion.     Again, thank you for choosing [facility].  Our hope is that these requests will decrease the amount of time that you wait before being seen by our physicians.       _____________________________________________________________  Should you have questions after your visit to [facility], please contact our office at [phone] between the hours of 8:30 a.m. and 4:30 p.m.  Voicemails left after 4:30 p.m. will not be returned until the following business day.  For prescription refill requests, have your pharmacy contact our office.         Resources For Cancer Patients and their Caregivers ? American Cancer Society: Can assist with transportation, wigs, general needs,  runs Look Good Feel Better.        425-717-5028 ? Cancer Care: Provides financial assistance, online support groups, medication/co-pay assistance.  1-800-813-HOPE 623-722-0525) ? [facility] Assists [place] cancer patients and their families through emotional , educational and financial support.  785 331 3667 ? [place] DSS Where to apply for food stamps, Medicaid and utility assistance. 231-534-9909 ? [employer]: Transportation to medical appointments. (941)805-1655 ? Social Security Administration: May apply for disability if have a Stage IV cancer. 207-674-2099 212-642-8650 ? [employer], Disability and Transit Services: Assists with nutrition, care and transit needs. [facility] Support Programs: '@10RELATIVEDAYS'$ @ > Cancer Support Group  2nd Tuesday of the month 1pm-2pm, Journey Room  > Creative Journey  3rd Tuesday of the month 1130am-1pm, Journey Room  > Look Good Feel Better  1st Wednesday of the month 10am-12 noon, Journey Room (Call American Cancer Society to register [phone])   Influenza Virus Vaccine injection (Fluarix) What is this medicine? INFLUENZA VIRUS VACCINE (in floo EN zuh VAHY ruhs vak SEEN) helps to reduce the risk of getting influenza also known as the flu. This medicine may be used for other purposes; ask your health care provider or pharmacist if you have questions. COMMON BRAND NAME(S): Fluarix, Fluzone What should I tell my health care provider before I take this medicine? They need to know if you have any of these conditions: -bleeding disorder like hemophilia -fever or infection -Guillain-Barre syndrome or other neurological problems -immune system problems -infection with  the human immunodeficiency virus (HIV) or AIDS -low blood platelet counts -multiple sclerosis -an unusual or allergic reaction to influenza virus vaccine, eggs, chicken proteins, latex, gentamicin, other medicines, foods, dyes  or preservatives -pregnant or trying to get pregnant -breast-feeding How should I use this medicine? This vaccine is for injection into a muscle. It is given by a health care professional. A copy of Vaccine Information Statements will be given before each vaccination. Read this sheet carefully each time. The sheet may change frequently. Talk to your pediatrician regarding the use of this medicine in children. Special care may be needed. Overdosage: If you think you have taken too much of this medicine contact a poison control center or emergency room at once. NOTE: This medicine is only for you. Do not share this medicine with others. What if I miss a dose? This does not apply. What may interact with this medicine? -chemotherapy or radiation therapy -medicines that lower your immune system like etanercept, anakinra, infliximab, and adalimumab -medicines that treat or prevent blood clots like warfarin -phenytoin -steroid medicines like prednisone or cortisone -theophylline -vaccines This list may not describe all possible interactions. Give your health care provider a list of all the medicines, herbs, non-prescription drugs, or dietary supplements you use. Also tell them if you smoke, drink alcohol, or use illegal drugs. Some items may interact with your medicine. What should I watch for while using this medicine? Report any side effects that do not go away within 3 days to your doctor or health care professional. Call your health care provider if any unusual symptoms occur within 6 weeks of receiving this vaccine. You may still catch the flu, but the illness is not usually as bad. You cannot get the flu from the vaccine. The vaccine will not protect against colds or other illnesses that may cause fever. The vaccine is needed every year. What side effects may I notice from receiving this medicine? Side effects that you should report to your doctor or health care professional as soon as  possible: -allergic reactions like skin rash, itching or hives, swelling of the face, lips, or tongue Side effects that usually do not require medical attention (report to your doctor or health care professional if they continue or are bothersome): -fever -headache -muscle aches and pains -pain, tenderness, redness, or swelling at site where injected -weak or tired This list may not describe all possible side effects. Call your doctor for medical advice about side effects. You may report side effects to FDA at 1-800-FDA-1088. Where should I keep my medicine? This vaccine is only given in a clinic, pharmacy, doctor's office, or other health care setting and will not be stored at home. NOTE: This sheet is a summary. It may not cover all possible information. If you have questions about this medicine, talk to your doctor, pharmacist, or health care provider.  2017 Elsevier/Gold Standard (2008-01-11 09:30:40)

## 2016-05-14 ENCOUNTER — Other Ambulatory Visit (HOSPITAL_COMMUNITY): Payer: Self-pay | Admitting: *Deleted

## 2016-05-14 ENCOUNTER — Telehealth: Payer: Self-pay | Admitting: Family Medicine

## 2016-05-14 NOTE — Telephone Encounter (Signed)
[employer] referral requested for follow up visits with Dr Whitney Muse for dx D75.1 Polycythemia.  Approved #[id number] for 6 visits from 05/13/16 - 11/09/16.

## 2016-05-18 ENCOUNTER — Other Ambulatory Visit: Payer: Self-pay | Admitting: Family Medicine

## 2016-05-26 ENCOUNTER — Telehealth: Payer: Self-pay | Admitting: *Deleted

## 2016-05-26 MED ORDER — OXYCODONE-ACETAMINOPHEN 7.5-325 MG PO TABS: 1.0000 | ORAL_TABLET | Freq: Three times a day (TID) | ORAL | 0 refills | Status: DC | PRN

## 2016-05-26 NOTE — Telephone Encounter (Signed)
Records indicate prescription refill appropriate for Percocet.  Per provider, ok to refill.   Prescription printed and may pick up on 05/29/2016.  Call placed to patient. No answer. No VM.

## 2016-05-27 ENCOUNTER — Telehealth: Payer: Self-pay | Admitting: Family Medicine

## 2016-05-27 NOTE — Telephone Encounter (Signed)
PATIENT CALLING TO GET RX FOR HER PERCOCET  [phone]

## 2016-05-28 ENCOUNTER — Other Ambulatory Visit: Payer: Self-pay | Admitting: Family Medicine

## 2016-05-28 ENCOUNTER — Ambulatory Visit (HOSPITAL_COMMUNITY): Payer: Self-pay

## 2016-05-28 NOTE — Telephone Encounter (Signed)
Please see prior message.   

## 2016-05-29 DIAGNOSIS — J9611 Chronic respiratory failure with hypoxia: Secondary | ICD-10-CM | POA: Diagnosis not present

## 2016-05-29 DIAGNOSIS — C3490 Malignant neoplasm of unspecified part of unspecified bronchus or lung: Secondary | ICD-10-CM | POA: Diagnosis not present

## 2016-06-01 ENCOUNTER — Ambulatory Visit (INDEPENDENT_AMBULATORY_CARE_PROVIDER_SITE_OTHER): Payer: Commercial Managed Care - HMO | Admitting: Family Medicine

## 2016-06-01 ENCOUNTER — Encounter: Payer: Self-pay | Admitting: Family Medicine

## 2016-06-01 VITALS — BP 142/74 | HR 82 | Temp 98.2°F | Resp 16 | Ht 67.0 in | Wt 244.0 lb

## 2016-06-01 DIAGNOSIS — E119 Type 2 diabetes mellitus without complications: Secondary | ICD-10-CM | POA: Diagnosis not present

## 2016-06-01 DIAGNOSIS — D751 Secondary polycythemia: Secondary | ICD-10-CM

## 2016-06-01 DIAGNOSIS — G894 Chronic pain syndrome: Secondary | ICD-10-CM | POA: Diagnosis not present

## 2016-06-01 DIAGNOSIS — I10 Essential (primary) hypertension: Secondary | ICD-10-CM | POA: Diagnosis not present

## 2016-06-01 DIAGNOSIS — E782 Mixed hyperlipidemia: Secondary | ICD-10-CM | POA: Diagnosis not present

## 2016-06-01 LAB — COMPREHENSIVE METABOLIC PANEL
ALBUMIN: 4 g/dL (ref 3.6–5.1)
ALK PHOS: 130 U/L (ref 33–130)
ALT: 12 U/L (ref 6–29)
AST: 14 U/L (ref 10–35)
BILIRUBIN TOTAL: 0.2 mg/dL (ref 0.2–1.2)
BUN: 17 mg/dL (ref 7–25)
CALCIUM: 9.9 mg/dL (ref 8.6–10.4)
CO2: 29 mmol/L (ref 20–31)
CREATININE: 0.65 mg/dL (ref 0.50–1.05)
Chloride: 102 mmol/L (ref 98–110)
Glucose, Bld: 166 mg/dL — ABNORMAL HIGH (ref 70–99)
Potassium: 5.2 mmol/L (ref 3.5–5.3)
Sodium: 141 mmol/L (ref 135–146)
TOTAL PROTEIN: 6.9 g/dL (ref 6.1–8.1)

## 2016-06-01 LAB — HEMOGLOBIN A1C
Hgb A1c MFr Bld: 6.4 % — ABNORMAL HIGH (ref <5.7)
Mean Plasma Glucose: 137 mg/dL

## 2016-06-01 LAB — LIPID PANEL
CHOLESTEROL: 178 mg/dL (ref <200)
HDL: 27 mg/dL — AB (ref >50)
LDL CALC: 88 mg/dL (ref <100)
TRIGLYCERIDES: 313 mg/dL — AB (ref <150)
Total CHOL/HDL Ratio: 6.6 Ratio — ABNORMAL HIGH (ref <5.0)
VLDL: 63 mg/dL — ABNORMAL HIGH (ref <30)

## 2016-06-01 MED ORDER — VARENICLINE TARTRATE 0.5 MG X 11 & 1 MG X 42 PO MISC: ORAL | 0 refills | Status: DC

## 2016-06-01 MED ORDER — LISINOPRIL 5 MG PO TABS: 5.0000 mg | ORAL_TABLET | Freq: Every day | ORAL | 2 refills | Status: DC

## 2016-06-01 MED ORDER — ATORVASTATIN CALCIUM 40 MG PO TABS: 40.0000 mg | ORAL_TABLET | Freq: Every day | ORAL | 3 refills | Status: DC

## 2016-06-01 NOTE — Assessment & Plan Note (Addendum)
She continues on her pain medication monthly secondary to degenerative disc disease in the cervical and lumbar region

## 2016-06-01 NOTE — Patient Instructions (Addendum)
Lisinopril changed to '5mg'$  ( you can take 2 of the 2.'5mg'$ ) We will call with lab results  Start chantix for smoking  F/U 4 months for Physical

## 2016-06-01 NOTE — Assessment & Plan Note (Signed)
Blood pressure is uncontrolled in the setting of having diabetes hyperlipidemia her blood pressures been elevated more in one setting. On increase her lisinopril to 5 mg she will continue the other medications at the current dose

## 2016-06-01 NOTE — Assessment & Plan Note (Signed)
Discussed use of Chantix she is content to try that she plans to start in the new year

## 2016-06-01 NOTE — Assessment & Plan Note (Signed)
Diabetes has been fairly well-controlled with her metformin will recheck her A1c today

## 2016-06-01 NOTE — Progress Notes (Signed)
Subjective:    Patient ID: Katelyn Cline, female    DOB: Sep 04, 1960, 55 y.o.   MRN: [id number]  Patient presents for 4 month F/U (is fasting)  Patient here for follow-up chronic medical problems. Her medications were reviewed. She had recent visit with oncology for her polycythemia in the setting of previous stage IV melanoma, per the notes she has not been very compliant with her therapeutic phlebotomies: CT for hematocrit less than 45 to prevent stroke and MI or thromboembolic events. She did have CT scan for surveillance which did not show any recurrence of any disease she does have pulmonary nodules which are being followed she hasn't repeat CT of chest in 3 months  Diabetes mellitus or last A1c was 7.1% 4 months ago - did not bring her meter- CBG 144-180's randomly  triglycerides 235 with an elevated VLDL at 47 She is currently on metformin her blood sugars at home Is also on lisinopril as well as TriCor and atorvastatin  Hypertension- taking BP meds as prescribed, was oncology BP was 156/55  Continues to smoke she is interested in quitting  Requires oxygen  She has had some stress recently her service dog has been having more difficulties  Review Of Systems:  GEN- denies fatigue, fever, weight loss,weakness, recent illness HEENT- denies eye drainage, change in vision, nasal discharge, CVS- denies chest pain, palpitations RESP- denies SOB, cough, wheeze ABD- denies N/V, change in stools, abd pain GU- denies dysuria, hematuria, dribbling, incontinence MSK- + joint pain, muscle aches, injury Neuro- denies headache, dizziness, syncope, seizure activity       Objective:    BP (!) 142/74 (BP Location: Left Arm, Patient Position: Sitting, Cuff Size: Large)   Pulse 82   Temp 98.2 F (36.8 C) (Oral)   Resp 16   Ht '5\' 7"'$  (1.702 m)   Wt 244 lb (110.7 kg)   SpO2 95%   BMI 38.22 kg/m  GEN- NAD, alert and oriented x3, oxygen 2L  HEENT- PERRL, EOMI, non injected sclera,  pink conjunctiva, MMM, oropharynx clear Neck- Supple, no thyromegaly CVS- RRR, no murmur RESP-CTAB EXT- No edema Pulses- Radial, DP- 2+        Assessment & Plan:      Problem List Items Addressed This Visit    Polycythemia secondary to smoking    Discussed use of Chantix she is content to try that she plans to start in the new year      Hyperlipidemia   Relevant Medications   lisinopril (PRINIVIL,ZESTRIL) 5 MG tablet   atorvastatin (LIPITOR) 40 MG tablet   Other Relevant Orders   Lipid panel   Essential hypertension, benign - Primary    Blood pressure is uncontrolled in the setting of having diabetes hyperlipidemia her blood pressures been elevated more in one setting. On increase her lisinopril to 5 mg she will continue the other medications at the current dose      Relevant Medications   lisinopril (PRINIVIL,ZESTRIL) 5 MG tablet   atorvastatin (LIPITOR) 40 MG tablet   Other Relevant Orders   Comprehensive metabolic panel   Diabetes mellitus type II, controlled ([place])    Diabetes has been fairly well-controlled with her metformin will recheck her A1c today      Relevant Medications   lisinopril (PRINIVIL,ZESTRIL) 5 MG tablet   atorvastatin (LIPITOR) 40 MG tablet   Other Relevant Orders   Hemoglobin A1c   Chronic pain disorder    She continues on her pain medication monthly secondary to  degenerative disc disease in the cervical and lumbar region         Note: This dictation was prepared with Dragon dictation along with smaller phrase technology. Any transcriptional errors that result from this process are unintentional.

## 2016-06-09 ENCOUNTER — Encounter (HOSPITAL_COMMUNITY)
Admission: RE | Admit: 2016-06-09 | Discharge: 2016-06-09 | Disposition: A | Discharge status: Home / Self Care | Payer: Commercial Managed Care - HMO | Source: Ambulatory Visit | Attending: Hematology & Oncology | Admitting: Hematology & Oncology

## 2016-06-09 DIAGNOSIS — C799 Secondary malignant neoplasm of unspecified site: Secondary | ICD-10-CM | POA: Insufficient documentation

## 2016-06-09 DIAGNOSIS — D45 Polycythemia vera: Secondary | ICD-10-CM | POA: Diagnosis not present

## 2016-06-09 NOTE — Progress Notes (Signed)
Katelyn Cline presents today for phlebotomy per MD orders. Phlebotomy procedure started at 0956  and ended at 64. 25 0z removed. Patient tolerated procedure well. IV needle removed intact from R AC.

## 2016-06-17 ENCOUNTER — Telehealth: Payer: Self-pay | Admitting: Family Medicine

## 2016-06-17 MED ORDER — OXYCODONE-ACETAMINOPHEN 7.5-325 MG PO TABS: 1.0000 | ORAL_TABLET | Freq: Three times a day (TID) | ORAL | 0 refills | Status: DC | PRN

## 2016-06-17 NOTE — Telephone Encounter (Signed)
okay

## 2016-06-17 NOTE — Telephone Encounter (Signed)
CB# [phone]  Patient requesting a prescription for her percocet 7.5-325 mg she states it isn't due until next week she just had me on the phone and went ahead and requested.

## 2016-06-17 NOTE — Telephone Encounter (Signed)
Prescription printed and patient made aware to come to office to pick up on 06/24/2016.

## 2016-06-17 NOTE — Telephone Encounter (Signed)
Ok to refill??  Last office visit 06/01/2016.  Last refill 05/26/2016.

## 2016-06-26 ENCOUNTER — Other Ambulatory Visit: Payer: Self-pay | Admitting: Family Medicine

## 2016-06-29 DIAGNOSIS — J9611 Chronic respiratory failure with hypoxia: Secondary | ICD-10-CM | POA: Diagnosis not present

## 2016-06-29 DIAGNOSIS — C3490 Malignant neoplasm of unspecified part of unspecified bronchus or lung: Secondary | ICD-10-CM | POA: Diagnosis not present

## 2016-07-22 ENCOUNTER — Telehealth: Payer: Self-pay | Admitting: Family Medicine

## 2016-07-22 MED ORDER — OXYCODONE-ACETAMINOPHEN 7.5-325 MG PO TABS: 1.0000 | ORAL_TABLET | Freq: Three times a day (TID) | ORAL | 0 refills | Status: DC | PRN

## 2016-07-22 NOTE — Telephone Encounter (Signed)
okay

## 2016-07-22 NOTE — Telephone Encounter (Signed)
Ok to refill??  Last office visit 06/01/2016.  Last refill 06/17/2016.

## 2016-07-22 NOTE — Telephone Encounter (Signed)
Pt wants to get refill on percocet.

## 2016-07-22 NOTE — Telephone Encounter (Signed)
Prescription printed and patient can pick up after 4 pm on 07/22/2016.  Call placed to patient. No answer. No VM.

## 2016-07-23 NOTE — Telephone Encounter (Signed)
Call placed to patient and patient made aware.  

## 2016-07-25 ENCOUNTER — Other Ambulatory Visit: Payer: Self-pay | Admitting: Family Medicine

## 2016-07-30 DIAGNOSIS — C3490 Malignant neoplasm of unspecified part of unspecified bronchus or lung: Secondary | ICD-10-CM | POA: Diagnosis not present

## 2016-07-30 DIAGNOSIS — J9611 Chronic respiratory failure with hypoxia: Secondary | ICD-10-CM | POA: Diagnosis not present

## 2016-08-07 ENCOUNTER — Telehealth: Payer: Self-pay | Admitting: Family Medicine

## 2016-08-07 ENCOUNTER — Other Ambulatory Visit: Payer: Self-pay | Admitting: Family Medicine

## 2016-08-07 NOTE — Telephone Encounter (Signed)
PATIENT CALLING TO GET RX FOR LIVINTEC IF POSSIBLE  [phone]

## 2016-08-10 NOTE — Telephone Encounter (Signed)
Call placed to patient to inquire. No answer. No VM.   

## 2016-08-11 MED ORDER — NALOXEGOL OXALATE 25 MG PO TABS: 25.0000 mg | ORAL_TABLET | Freq: Every day | ORAL | 3 refills | Status: DC

## 2016-08-11 NOTE — Telephone Encounter (Signed)
Call placed to patient.   Reports that she requires refill on MOVANTIK.   Prescription sent to pharmacy.

## 2016-08-13 ENCOUNTER — Ambulatory Visit (HOSPITAL_COMMUNITY)
Admission: RE | Admit: 2016-08-13 | Discharge: 2016-08-13 | Disposition: A | Discharge status: Home / Self Care | Payer: Medicare HMO | Source: Ambulatory Visit | Attending: Hematology & Oncology | Admitting: Hematology & Oncology

## 2016-08-13 DIAGNOSIS — R918 Other nonspecific abnormal finding of lung field: Secondary | ICD-10-CM | POA: Insufficient documentation

## 2016-08-13 DIAGNOSIS — I358 Other nonrheumatic aortic valve disorders: Secondary | ICD-10-CM | POA: Insufficient documentation

## 2016-08-13 DIAGNOSIS — I700 Atherosclerosis of aorta: Secondary | ICD-10-CM | POA: Insufficient documentation

## 2016-08-13 DIAGNOSIS — I2510 Atherosclerotic heart disease of native coronary artery without angina pectoris: Secondary | ICD-10-CM | POA: Diagnosis not present

## 2016-08-13 DIAGNOSIS — C799 Secondary malignant neoplasm of unspecified site: Secondary | ICD-10-CM | POA: Insufficient documentation

## 2016-08-13 DIAGNOSIS — C439 Malignant melanoma of skin, unspecified: Secondary | ICD-10-CM

## 2016-08-14 ENCOUNTER — Telehealth: Payer: Self-pay | Admitting: *Deleted

## 2016-08-14 NOTE — Telephone Encounter (Signed)
Received request from pharmacy for [place] on [address].   PA submitted.   Dx: OIC- K59.03.  The request has received a Pending outcome. You will receive a final determination electronically in CoverMyMeds and via email and fax within 24 to 72 hours.

## 2016-08-17 ENCOUNTER — Other Ambulatory Visit (HOSPITAL_COMMUNITY): Payer: Self-pay | Admitting: Oncology

## 2016-08-17 ENCOUNTER — Encounter (HOSPITAL_COMMUNITY): Payer: Medicare HMO

## 2016-08-17 ENCOUNTER — Encounter (HOSPITAL_COMMUNITY): Payer: Self-pay

## 2016-08-17 ENCOUNTER — Encounter (HOSPITAL_COMMUNITY): Payer: Medicare HMO | Attending: Hematology & Oncology | Admitting: Oncology

## 2016-08-17 VITALS — BP 132/68 | HR 85 | Temp 98.3°F | Resp 20 | Wt 240.1 lb

## 2016-08-17 DIAGNOSIS — K5900 Constipation, unspecified: Secondary | ICD-10-CM | POA: Insufficient documentation

## 2016-08-17 DIAGNOSIS — M797 Fibromyalgia: Secondary | ICD-10-CM | POA: Insufficient documentation

## 2016-08-17 DIAGNOSIS — C799 Secondary malignant neoplasm of unspecified site: Secondary | ICD-10-CM | POA: Insufficient documentation

## 2016-08-17 DIAGNOSIS — D45 Polycythemia vera: Secondary | ICD-10-CM | POA: Insufficient documentation

## 2016-08-17 DIAGNOSIS — F17210 Nicotine dependence, cigarettes, uncomplicated: Secondary | ICD-10-CM | POA: Diagnosis not present

## 2016-08-17 DIAGNOSIS — C439 Malignant melanoma of skin, unspecified: Secondary | ICD-10-CM

## 2016-08-17 DIAGNOSIS — Z720 Tobacco use: Secondary | ICD-10-CM | POA: Diagnosis not present

## 2016-08-17 DIAGNOSIS — Z9119 Patient's noncompliance with other medical treatment and regimen: Secondary | ICD-10-CM | POA: Insufficient documentation

## 2016-08-17 LAB — CBC WITH DIFFERENTIAL/PLATELET
BASOS PCT: 1 %
Basophils Absolute: 0.2 10*3/uL — ABNORMAL HIGH (ref 0.0–0.1)
Eosinophils Absolute: 0.5 10*3/uL (ref 0.0–0.7)
Eosinophils Relative: 3 %
HCT: 50 % — ABNORMAL HIGH (ref 36.0–46.0)
HEMOGLOBIN: 14.5 g/dL (ref 12.0–15.0)
LYMPHS ABS: 2.4 10*3/uL (ref 0.7–4.0)
LYMPHS PCT: 15 %
MCH: 18.8 pg — AB (ref 26.0–34.0)
MCHC: 29 g/dL — AB (ref 30.0–36.0)
MCV: 64.9 fL — ABNORMAL LOW (ref 78.0–100.0)
MONOS PCT: 3 %
Monocytes Absolute: 0.5 10*3/uL (ref 0.1–1.0)
NEUTROS ABS: 12.7 10*3/uL — AB (ref 1.7–7.7)
Neutrophils Relative %: 78 %
Platelets: 436 10*3/uL — ABNORMAL HIGH (ref 150–400)
RBC: 7.7 MIL/uL — ABNORMAL HIGH (ref 3.87–5.11)
RDW: 24.5 % — ABNORMAL HIGH (ref 11.5–15.5)
WBC: 16.3 10*3/uL — ABNORMAL HIGH (ref 4.0–10.5)

## 2016-08-17 NOTE — Progress Notes (Signed)
Katelyn Cline at [place], [place], [place] [address] Katelyn Cline [address]    DIAGNOSIS: Metastatic melanoma, BRAF mutation negative             Right lung resection by Dr. Servando Snare on 10/21/2012             JAK 2 positive P. Vera                 SUMMARY OF ONCOLOGIC HISTORY:   Polycythemia vera ([place])   08/05/2012 Initial Diagnosis    Polycythemia vera ([place])       Metastatic melanoma ([place])   10/08/2015 Imaging    CT CAP- Pulmonary nodules are stable to slightly decreased in size. No new sites of metastatic disease in the chest, abdomen or pelvis.       05/06/2016 Imaging    CT CAP- Stable patchy centrilobular ground-glass micronodularity throughout both lungs, upper lung predominant. Stable mosaic attenuation throughout both lungs. This combination of findings raises the possibility of subacute hypersensitivity pneumonitis. However, a few scattered solid pulmonary nodules measuring up to 7 mm in the left lower lobe are new or slightly increased in size, and pulmonary metastases cannot be excluded given this patient's history of pulmonary metastatic melanoma. These nodules are below PET resolution. Continued chest CT surveillance is warranted in 3-6 months.      08/13/2016 Imaging    CT chest- 1. Most previously noted pulmonary nodules appear stable compared to the prior study. The exception is the largest nodule in the left lower lobe which currently measures 8 mm (previously 7 mm on 05/06/2016 and 6 mm on 10/08/2015). 2. Aortic atherosclerosis, in addition to left main and 3 vessel coronary artery disease. Please note that although the presence of coronary artery calcium documents the presence of coronary artery disease, the severity of this disease and any potential stenosis cannot be assessed on this non-gated CT examination. Assessment for potential risk factor modification, dietary therapy or pharmacologic therapy may be  warranted, if clinically indicated. 3. There are calcifications of the aortic valve. Echocardiographic correlation for evaluation of potential valvular dysfunction may be warranted if clinically indicated.       CURRENT THERAPY: Observation        Phlebotomy   INTERVAL HISTORY: Katelyn Cline 56 y.o. female returns for follow-up of a history of stage IV melanoma, primary site not identified and polycythemia vera.   She has been doing well. Her last phlebotomy was 3 months ago. She is still smoking 1 ppd, but that is less than she use to. She is on oxygen with nasal cannula due to her SOB. She has been having problems with her fibromyalgia. She has some constipation from her pain medication. Denies abdominal pain, leg swelling, or any other concerns.    MEDICAL HISTORY: Past Medical History:  Diagnosis Date  . Agoraphobia   . Basal cell carcinoma   . Chronic pain   . Chronic respiratory failure with hypoxia ([name])    3L [place]  . DM type 2 (diabetes mellitus, type 2) ([place]) 10/16/2013  . Dysrhythmia    Hx: of palpitations "a long time ago"  . Elevated hemoglobin ([place]) 2014  . Fibromyalgia   . History of UTI   . HTN (hypertension)   . Hyperlipidemia   . Major depression   . Metastatic melanoma ([place]) 10/18/2012   12 mm posterior right upper lobe pulmonary nodule, max SUV 3.0    .  Migraines   . Miscarriage    x 4  . Narcolepsy   . Panic attacks   . Peripheral neuropathy ([place])   . Peripheral neuropathy ([name])    in feet  . Polycythemia secondary to smoking   . Polycythemia vera(238.4)   . Scoliosis   . Shortness of breath    Hx: of with activity  . Sleep apnea   . Vertigo     has DDD (degenerative disc disease), lumbar; DDD (degenerative disc disease), cervical; Chronic pain disorder; Depression; Fibromyalgia; Hyperlipidemia; Narcolepsy; Peripheral neuropathy ([place]); Tobacco use disorder; Obesity; Essential hypertension, benign; Non-healing skin lesion of nose; Polycythemia  vera ([place]); Polycythemia secondary to smoking; Metastatic melanoma ([place]); Diabetes mellitus type II, controlled ([place]); Chronic hypoxemic respiratory failure ([place]); Basal cell carcinoma of skin; Posttraumatic stress disorder; MRSA (methicillin resistant staph aureus) culture positive; Gastroenteritis; PVC (premature ventricular contraction); Seborrheic keratoses; OE (otitis externa); and Constipation on her problem list.     is allergic to contrast media [iodinated diagnostic agents]; erythromycin; flagyl [metronidazole]; latex; other; penicillins; and tetracyclines & related.  [name] does not currently have medications on file.  SURGICAL HISTORY: Past Surgical History:  Procedure Laterality Date  . BASAL CELL CARCINOMA EXCISION     flap surgery on face  . BREAST LUMPECTOMY     bilateral  . COLONOSCOPY W/ BIOPSIES AND POLYPECTOMY     Hx: of  . CYSTECTOMY     abdominal wall   . DILATION AND CURETTAGE OF UTERUS     Hx: of   . VAGINAL HYSTERECTOMY    . VIDEO ASSISTED THORACOSCOPY (VATS)/WEDGE RESECTION Right 10/21/2012   Procedure: VIDEO ASSISTED THORACOSCOPY (VATS)/WEDGE RESECTION;  Surgeon: [name], MD;  Location: [facility];  Service: Thoracic;  Laterality: Right;  Marland Kitchen VIDEO BRONCHOSCOPY N/A 10/21/2012   Procedure: VIDEO BRONCHOSCOPY;  Surgeon: [name], MD;  Location: [facility];  Service: Thoracic;  Laterality: N/A;    SOCIAL HISTORY: Social History   Social History  . Marital status: Divorced    Spouse name: N/A  . Number of children: N/A  . Years of education: 12+   Occupational History  . Not on file.   Social History Main Topics  . Smoking status: Current Every Day Smoker    Packs/day: 1.00    Years: 10.00    Types: Cigarettes    Last attempt to quit: 09/16/2012  . Smokeless tobacco: Never Used  . Alcohol use Yes     Comment: 1 -2 drinks a month, occasional  . Drug use: No  . Sexual activity: Not Currently   Other Topics Concern  . Not on file    Social History Narrative  . No narrative on file    FAMILY HISTORY: Family History  Problem Relation Age of Onset  . Arthritis Mother   . Hyperlipidemia Mother   . Depression Mother   . Anxiety disorder Mother   . Dementia Mother   . Hypertension Father   . Hyperlipidemia Father   . Heart disease Father   . Stroke Father   . Dementia Father   . Heart disease Brother   . ADD / ADHD Son   . Alcohol abuse Maternal Grandfather   . Bipolar disorder Neg Hx   . Drug abuse Neg Hx   . OCD Neg Hx   . Paranoid behavior Neg Hx   . Schizophrenia Neg Hx   . Seizures Neg Hx   . Sexual abuse Neg Hx   . Physical abuse Neg  Hx     Review of Systems  Constitutional: Negative.   HENT: Negative.   Eyes: Negative.   Respiratory: Positive for shortness of breath.   Cardiovascular: Negative.  Negative for leg swelling.  Gastrointestinal: Positive for constipation. Negative for abdominal pain.  Genitourinary: Negative.   Musculoskeletal: Positive for myalgias (fibromyalgia).  Skin: Negative.   Neurological: Negative.   Endo/Heme/Allergies: Negative.   Psychiatric/Behavioral: Negative.   All other systems reviewed and are negative. 14 point review of systems was performed and is negative except as detailed under history of present illness and above  PHYSICAL EXAMINATION ECOG PERFORMANCE STATUS: 1 - Symptomatic but completely ambulatory  Vitals:   08/17/16 1058  BP: 132/68  Pulse: 85  Resp: 20  Temp: 98.3 F (36.8 C)   Physical Exam  Constitutional: She is oriented to person, place, and time and well-developed, well-nourished, and in no distress.  HENT:  Head: Normocephalic and atraumatic.  Eyes: EOM are normal. Pupils are equal, round, and reactive to light.  Neck: Normal range of motion. Neck supple.  Cardiovascular: Normal rate and normal heart sounds.   Pulmonary/Chest: Effort normal and breath sounds normal.  Abdominal: Soft. Bowel sounds are normal.  Musculoskeletal:  Normal range of motion.  Neurological: She is alert and oriented to person, place, and time. Gait normal.  Skin: Skin is warm and dry.  Nursing note and vitals reviewed. 14 point review of systems was performed and is negative except as detailed under history of present illness and above  LABORATORY DATA: I have reviewed the data as listed.   CBC    Component Value Date/Time   WBC 19.7 (H) 05/13/2016 1135   RBC 7.02 (H) 05/13/2016 1135   HGB 14.0 05/13/2016 1135   HCT 47.5 (H) 05/13/2016 1135   PLT 406 (H) 05/13/2016 1135   MCV 67.7 (L) 05/13/2016 1135   MCH 19.9 (L) 05/13/2016 1135   MCHC 29.5 (L) 05/13/2016 1135   RDW 23.6 (H) 05/13/2016 1135   LYMPHSABS 3,325 01/21/2016 0847   MONOABS 525 01/21/2016 0847   EOSABS 350 01/21/2016 0847   BASOSABS 0 01/21/2016 0847   CMP     Component Value Date/Time   NA 141 06/01/2016 0854   K 5.2 06/01/2016 0854   CL 102 06/01/2016 0854   CO2 29 06/01/2016 0854   GLUCOSE 166 (H) 06/01/2016 0854   BUN 17 06/01/2016 0854   CREATININE 0.65 06/01/2016 0854   CALCIUM 9.9 06/01/2016 0854   PROT 6.9 06/01/2016 0854   ALBUMIN 4.0 06/01/2016 0854   AST 14 06/01/2016 0854   ALT 12 06/01/2016 0854   ALKPHOS 130 06/01/2016 0854   BILITOT 0.2 06/01/2016 0854   GFRNONAA >60 10/08/2015 0925   GFRAA >60 10/08/2015 0925    RADIOLOGY: I have reviewed the images below and agree with the report  CT Chest w/o Contrast 08/13/2016 IMPRESSION: 1. Most previously noted pulmonary nodules appear stable compared to the prior study. The exception is the largest nodule in the left lower lobe which currently measures 8 mm (previously 7 mm on 05/06/2016 and 6 mm on 10/08/2015). 2. Aortic atherosclerosis, in addition to left main and 3 vessel coronary artery disease. Please note that although the presence of coronary artery calcium documents the presence of coronary artery disease, the severity of this disease and any potential stenosis cannot be assessed  on this non-gated CT examination. Assessment for potential risk factor modification, dietary therapy or pharmacologic therapy may be warranted, if clinically indicated. 3. There  are calcifications of the aortic valve. Echocardiographic correlation for evaluation of potential valvular dysfunction may be warranted if clinically indicated.  ASSESSMENT and THERAPY PLAN:  JAK 2 positive Polycythemia Vera Secondary Erythrocytosis Stage IV melanoma  Pulmonary nodules  Polycythemia vera JAK2 positive, polycythemia vera.  Managed with therapeutic phlebotomies. She is intermittently non-compliant.  Goal HCT < 45.  She has periods where she remains compliant and periods where she does not.  I emphasized the importance of preventing disease related complications through maintaining HCT < 45 such as stroke, MI and other thromboembolic complications. She is agreeable to rechecking her CBC today.    Metastatic melanoma CT imaging was reviewed with the patient in detail. We will continue with ongoing observation. Scans will be obtained again in 3 months, as per radiology recommendations. .  Screening  Reviewed the CT Chest with the patient. Stable nodules. Order repeat surveillance CT CAP in 6 months for her metastatic melanoma.   Her labs today are not back yet. Goal hct<45%. I will review her labs and call her if she needs a phlebotomy.   Discussed quitting smoking.   She will return for follow up in 3 months for follow up with CBC, CMP.  All questions were answered. The patient knows to call the clinic with any problems, questions or concerns. We can certainly see the patient much sooner if necessary.  This document serves as a record of services personally performed by Twana First, MD. It was created on her behalf by Martinique Casey, a trained medical scribe. The creation of this record is based on the scribe's personal observations and the provider's statements to them. This document has been  checked and approved by the attending provider.  I have reviewed the above documentation for accuracy and completeness, and I agree with the above.  Martinique M Casey

## 2016-08-17 NOTE — Patient Instructions (Signed)
[place] at [facility] Discharge Instructions  RECOMMENDATIONS MADE BY THE CONSULTANT AND ANY TEST RESULTS WILL BE SENT TO YOUR REFERRING PHYSICIAN.  You were seen today by Dr. Barron Schmid Follow up in 3 months with lab work CT scan in 6 months See Amy up front for appointments   Thank you for choosing [facility] at [facility] to provide your oncology and hematology care.  To afford each patient quality time with our provider, please arrive at least 15 minutes before your scheduled appointment time.    If you have a lab appointment with the [facility] please come in thru the  Main Entrance and check in at the main information desk  You need to re-schedule your appointment should you arrive 10 or more minutes late.  We strive to give you quality time with our providers, and arriving late affects you and other patients whose appointments are after yours.  Also, if you no show three or more times for appointments you may be dismissed from the clinic at the providers discretion.     Again, thank you for choosing [facility].  Our hope is that these requests will decrease the amount of time that you wait before being seen by our physicians.       _____________________________________________________________  Should you have questions after your visit to [facility], please contact our office at [phone] between the hours of 8:30 a.m. and 4:30 p.m.  Voicemails left after 4:30 p.m. will not be returned until the following business day.  For prescription refill requests, have your pharmacy contact our office.       Resources For Cancer Patients and their Caregivers ? American Cancer Society: Can assist with transportation, wigs, general needs, runs Look Good Feel Better.        940-636-8404 ? Cancer Care: Provides financial assistance, online support groups, medication/co-pay assistance.  1-800-813-HOPE  402 406 9363) ? [place] Assists [place] cancer patients and their families through emotional , educational and financial support.  (910) 161-9003 ? [place] DSS Where to apply for food stamps, Medicaid and utility assistance. 252-240-2436 ? [facility]: Transportation to medical appointments. (843)004-3318 ? Social Security Administration: May apply for disability if have a Stage IV cancer. (307)655-4474 [phone] ? [employer], Disability and Transit Services: Assists with nutrition, care and transit needs. [place] Support Programs: '@10RELATIVEDAYS'$ @ > Cancer Support Group  2nd Tuesday of the month 1pm-2pm, Journey Room  > Creative Journey  3rd Tuesday of the month 1130am-1pm, Journey Room  > Look Good Feel Better  1st Wednesday of the month 10am-12 noon, Journey Room (Call [place] to register [phone])

## 2016-08-19 ENCOUNTER — Telehealth: Payer: Self-pay | Admitting: Family Medicine

## 2016-08-19 MED ORDER — OXYCODONE-ACETAMINOPHEN 7.5-325 MG PO TABS: 1.0000 | ORAL_TABLET | Freq: Three times a day (TID) | ORAL | 0 refills | Status: DC | PRN

## 2016-08-19 NOTE — Telephone Encounter (Signed)
okay

## 2016-08-19 NOTE — Telephone Encounter (Signed)
Ok to refill??  Last office visit 06/01/2016.  Last refill 07/22/2016.

## 2016-08-19 NOTE — Telephone Encounter (Signed)
Patient calling for rx for her percocet  [phone]

## 2016-08-19 NOTE — Telephone Encounter (Signed)
Prescription printed and patient made aware to come to office to pick up on 08/20/2016.

## 2016-08-20 NOTE — Telephone Encounter (Signed)
Received PA determination.   PA denied.   Patient must try and fail: Lactulose Relistor  MD please advise.

## 2016-08-21 MED ORDER — METHYLNALTREXONE BROMIDE 150 MG PO TABS
1.0000 | ORAL_TABLET | Freq: Every day | ORAL | 0 refills | Status: DC
Start: 2016-08-21 — End: 2016-10-07

## 2016-08-21 NOTE — Telephone Encounter (Signed)
Pt made aware of medication change and Rx to pharmacy.

## 2016-08-21 NOTE — Telephone Encounter (Signed)
Try Relistor '150mg'$  once a day

## 2016-08-27 DIAGNOSIS — C3490 Malignant neoplasm of unspecified part of unspecified bronchus or lung: Secondary | ICD-10-CM | POA: Diagnosis not present

## 2016-08-27 DIAGNOSIS — J9611 Chronic respiratory failure with hypoxia: Secondary | ICD-10-CM | POA: Diagnosis not present

## 2016-09-04 ENCOUNTER — Other Ambulatory Visit: Payer: Self-pay | Admitting: Family Medicine

## 2016-09-15 ENCOUNTER — Telehealth: Payer: Self-pay | Admitting: Family Medicine

## 2016-09-15 MED ORDER — OXYCODONE-ACETAMINOPHEN 7.5-325 MG PO TABS: 1.0000 | ORAL_TABLET | Freq: Three times a day (TID) | ORAL | 0 refills | Status: DC | PRN

## 2016-09-15 NOTE — Telephone Encounter (Signed)
Okay 

## 2016-09-15 NOTE — Telephone Encounter (Signed)
Pt needs refill on percocet

## 2016-09-15 NOTE — Telephone Encounter (Signed)
Ok to refill??  Last office visit 06/01/2016.  Last refill 08/19/2016.

## 2016-09-15 NOTE — Telephone Encounter (Signed)
Prescription printed and patient made aware to come to office to pick up on 09/16/2016.

## 2016-09-18 ENCOUNTER — Other Ambulatory Visit: Payer: Self-pay | Admitting: Family Medicine

## 2016-09-18 NOTE — Telephone Encounter (Signed)
Medication refilled per protocol. 

## 2016-09-27 DIAGNOSIS — C3490 Malignant neoplasm of unspecified part of unspecified bronchus or lung: Secondary | ICD-10-CM | POA: Diagnosis not present

## 2016-09-27 DIAGNOSIS — J9611 Chronic respiratory failure with hypoxia: Secondary | ICD-10-CM | POA: Diagnosis not present

## 2016-09-30 ENCOUNTER — Encounter: Payer: Commercial Managed Care - HMO | Admitting: Family Medicine

## 2016-10-07 ENCOUNTER — Other Ambulatory Visit: Payer: Self-pay | Admitting: Family Medicine

## 2016-10-07 ENCOUNTER — Ambulatory Visit (INDEPENDENT_AMBULATORY_CARE_PROVIDER_SITE_OTHER): Payer: Medicare HMO | Admitting: Family Medicine

## 2016-10-07 ENCOUNTER — Encounter: Payer: Self-pay | Admitting: Family Medicine

## 2016-10-07 VITALS — BP 130/72 | HR 78 | Temp 98.5°F | Resp 14 | Ht 67.0 in | Wt 248.0 lb

## 2016-10-07 DIAGNOSIS — Z0000 Encounter for general adult medical examination without abnormal findings: Secondary | ICD-10-CM | POA: Diagnosis not present

## 2016-10-07 DIAGNOSIS — G6289 Other specified polyneuropathies: Secondary | ICD-10-CM

## 2016-10-07 DIAGNOSIS — Z1231 Encounter for screening mammogram for malignant neoplasm of breast: Secondary | ICD-10-CM | POA: Diagnosis not present

## 2016-10-07 DIAGNOSIS — Z6838 Body mass index (BMI) 38.0-38.9, adult: Secondary | ICD-10-CM

## 2016-10-07 DIAGNOSIS — I10 Essential (primary) hypertension: Secondary | ICD-10-CM | POA: Diagnosis not present

## 2016-10-07 DIAGNOSIS — Z1239 Encounter for other screening for malignant neoplasm of breast: Secondary | ICD-10-CM

## 2016-10-07 DIAGNOSIS — D45 Polycythemia vera: Secondary | ICD-10-CM | POA: Diagnosis not present

## 2016-10-07 DIAGNOSIS — Z1211 Encounter for screening for malignant neoplasm of colon: Secondary | ICD-10-CM

## 2016-10-07 DIAGNOSIS — E119 Type 2 diabetes mellitus without complications: Secondary | ICD-10-CM | POA: Diagnosis not present

## 2016-10-07 DIAGNOSIS — G894 Chronic pain syndrome: Secondary | ICD-10-CM | POA: Diagnosis not present

## 2016-10-07 DIAGNOSIS — F17200 Nicotine dependence, unspecified, uncomplicated: Secondary | ICD-10-CM | POA: Diagnosis not present

## 2016-10-07 DIAGNOSIS — E782 Mixed hyperlipidemia: Secondary | ICD-10-CM

## 2016-10-07 DIAGNOSIS — E6609 Other obesity due to excess calories: Secondary | ICD-10-CM | POA: Diagnosis not present

## 2016-10-07 DIAGNOSIS — IMO0001 Reserved for inherently not codable concepts without codable children: Secondary | ICD-10-CM

## 2016-10-07 LAB — COMPREHENSIVE METABOLIC PANEL
ALBUMIN: 3.8 g/dL (ref 3.6–5.1)
ALK PHOS: 134 U/L — AB (ref 33–130)
ALT: 9 U/L (ref 6–29)
AST: 11 U/L (ref 10–35)
BILIRUBIN TOTAL: 0.3 mg/dL (ref 0.2–1.2)
BUN: 15 mg/dL (ref 7–25)
CHLORIDE: 102 mmol/L (ref 98–110)
CO2: 28 mmol/L (ref 20–31)
CREATININE: 0.74 mg/dL (ref 0.50–1.05)
Calcium: 9.2 mg/dL (ref 8.6–10.4)
Glucose, Bld: 174 mg/dL — ABNORMAL HIGH (ref 70–99)
Potassium: 4.8 mmol/L (ref 3.5–5.3)
SODIUM: 140 mmol/L (ref 135–146)
TOTAL PROTEIN: 6.8 g/dL (ref 6.1–8.1)

## 2016-10-07 LAB — CBC WITH DIFFERENTIAL/PLATELET
Basophils Absolute: 168 cells/uL (ref 0–200)
Basophils Relative: 1 %
EOS ABS: 336 {cells}/uL (ref 15–500)
EOS PCT: 2 %
HCT: 47.5 % — ABNORMAL HIGH (ref 35.0–45.0)
HEMOGLOBIN: 14.1 g/dL (ref 12.0–15.0)
LYMPHS ABS: 2352 {cells}/uL (ref 850–3900)
Lymphocytes Relative: 14 %
MCH: 19.6 pg — AB (ref 27.0–33.0)
MCHC: 29.7 g/dL — ABNORMAL LOW (ref 32.0–36.0)
MCV: 66.2 fL — ABNORMAL LOW (ref 80.0–100.0)
Monocytes Absolute: 672 cells/uL (ref 200–950)
Monocytes Relative: 4 %
Neutro Abs: 13272 cells/uL — ABNORMAL HIGH (ref 1500–7800)
Neutrophils Relative %: 79 %
Platelets: 351 10*3/uL (ref 140–400)
RBC: 7.18 MIL/uL — AB (ref 3.80–5.10)
RDW: 22 % — ABNORMAL HIGH (ref 11.0–15.0)
WBC: 16.8 10*3/uL — AB (ref 3.8–10.8)

## 2016-10-07 LAB — LIPID PANEL
Cholesterol: 173 mg/dL (ref <200)
HDL: 33 mg/dL — ABNORMAL LOW (ref >50)
LDL CALC: 102 mg/dL — AB (ref <100)
Total CHOL/HDL Ratio: 5.2 Ratio — ABNORMAL HIGH (ref <5.0)
Triglycerides: 192 mg/dL — ABNORMAL HIGH (ref <150)
VLDL: 38 mg/dL — ABNORMAL HIGH (ref <30)

## 2016-10-07 MED ORDER — LISINOPRIL 5 MG PO TABS: 5.0000 mg | ORAL_TABLET | Freq: Every day | ORAL | 2 refills | Status: DC

## 2016-10-07 MED ORDER — ZOSTER VAC RECOMB ADJUVANTED 50 MCG/0.5ML IM SUSR: 0.5000 mL | Freq: Once | INTRAMUSCULAR | 0 refills | Status: AC

## 2016-10-07 MED ORDER — OXYCODONE-ACETAMINOPHEN 7.5-325 MG PO TABS: 1.0000 | ORAL_TABLET | Freq: Three times a day (TID) | ORAL | 0 refills | Status: DC | PRN

## 2016-10-07 NOTE — Patient Instructions (Addendum)
Referral for colonoscopy Schedule mammogram Shingrix vaccine sent to pharmacy  Referral to Dr. Herbert Deaner F/U 4 months

## 2016-10-07 NOTE — Progress Notes (Signed)
Subjective:   Patient presents for Medicare Annual/Subsequent preventive examination.    Patient here for annual wellness exam. Her last visit in December her blood pressure was uncontrolled and I increased her lisinopril to 5 mg Diabetes mellitus this is been well controlled with metformin she is due for recheck on this. Her weight is up or pounds since her last visit. Hypertriglyceries continues to be an issue despite her medications, due to diet   CBG- around 129, no meter with her today   Polycythemia she is still followed by hematology we discussed using Chantix which she was going to start in January of this year- states she could not afford, now smoking regular cigarrettes instead of menthol    chronic pain disorder she still   medications reviewed    Review Past Medical/Family/Social: Per EMR   Risk Factors  Current exercise habits: minimal Dietary issues discussed: yes  Cardiac risk factors: Obesity (BMI >= 30 kg/m2). HTN ,DM  Depression Screen - on current treatment (Note: if answer to either of the following is "Yes", a more complete depression screening is indicated)  Over the past two weeks, have you felt down, depressed or hopeless? No Over the past two weeks, have you felt little interest or pleasure in doing things? No Have you lost interest or pleasure in daily life? No Do you often feel hopeless? No Do you cry easily over simple problems? No   Activities of Daily Living  In your present state of health, do you have any difficulty performing the following activities?:  Driving? No  Managing money? No  Feeding yourself? No  Getting from bed to chair? No  Climbing a flight of stairs? yes Preparing food and eating?: No  Bathing or showering? yes Getting dressed: No  Getting to the toilet? No  Using the toilet:No  Moving around from place to place: yes  In the past year have you fallen or had a near fall?:No  Are you sexually active? No  Do you have more  than one partner? No   Hearing Difficulties: yes  Do you often ask people to speak up or repeat themselves? No  Do you experience ringing or noises in your ears? No Do you have difficulty understanding soft or whispered voices?yes  Do you feel that you have a problem with memory? No Do you often misplace items? No  Do you feel safe at home? Yes  Cognitive Testing  Alert? Yes Normal Appearance?Yes  Oriented to person? Yes Place? Yes  Time? Yes  Recall of three objects? Yes  Can perform simple calculations? Yes  Displays appropriate judgment?Yes  Can read the correct time from a watch face?Yes   List the Names of Other Physician/Practitioners you currently use:   Hematology  Screening Tests / Date Colonoscopy      Declines                Zostavax  Discussed Mammogram - Due in June  Pneumonia Vaccine  UTD Tetanus/tdap - not covered  ROS: GEN- denies fatigue, fever, weight loss,weakness, recent illness HEENT- denies eye drainage, change in vision, nasal discharge, CVS- denies chest pain, palpitations RESP- denies SOB, cough, wheeze ABD- denies N/V, change in stools, abd pain GU- denies dysuria, hematuria, dribbling, incontinence MSK- + joint pain, muscle aches, injury Neuro- denies headache, dizziness, syncope, seizure activity  Physical:  GEN- NAD, alert and oriented x3 HEENT- PERRL, EOMI, non injected sclera, pink conjunctiva, MMM, oropharynx clear Neck- Supple, no thryomegaly,no bruit CVS- RRR,  no murmur RESP-CTAB ABD-NABS,soft,NT,ND  EXT- No edema Pulses- Radial, DP- 2+      Assessment:    Annual wellness medicare exam   Plan:    During the course of the visit the patient was educated and counseled about appropriate screening and preventive services including:  Screening mammography - pt tp schedule  Colorectal cancer screening - Referral placed Eye exam- referral placed  Shingles vaccine. Prescription given to that she can get the vaccine at the  pharmacy or Medicare part D.  Current treatment for depressiopn- continue Cymbalta, also has therapy dog    Discussed tobacco cessation, She cannot afford any supplement states that she is just trying to cut back on her own.  Discussed her dietary habits and her snacking should not seem very receptive about this. This is affecting her diabetes and her hypertriglycerides which was her at risk for heart disease. We'll check her fasting labs today.  Blood pressure was much better controlled today no change in medications   Chronic pain- pain medication refilled, no sign of abuse/misuse  Patient Instructions (the written plan) was given to the patient.  Medicare Attestation  I have personally reviewed:  The patient's medical and social history  Their use of alcohol, tobacco or illicit drugs  Their current medications and supplements  The patient's functional ability including ADLs,fall risks, home safety risks, cognitive, and hearing and visual impairment  Diet and physical activities  Evidence for depression or mood disorders  The patient's weight, height, BMI, and visual acuity have been recorded in the chart. I have made referrals, counseling, and provided education to the patient based on review of the above and I have provided the patient with a written personalized care plan for preventive services.

## 2016-10-07 NOTE — Addendum Note (Signed)
Addended by: Sheral Flow on: 10/07/2016 09:15 AM   Modules accepted: Orders

## 2016-10-08 LAB — MICROALBUMIN / CREATININE URINE RATIO
Creatinine, Urine: 67 mg/dL (ref 20–320)
Microalb Creat Ratio: 39 mcg/mg creat — ABNORMAL HIGH (ref <30)
Microalb, Ur: 2.6 mg/dL

## 2016-10-08 LAB — HEMOGLOBIN A1C
HEMOGLOBIN A1C: 6.8 % — AB (ref <5.7)
MEAN PLASMA GLUCOSE: 148 mg/dL

## 2016-10-13 ENCOUNTER — Encounter: Payer: Self-pay | Admitting: *Deleted

## 2016-10-15 ENCOUNTER — Other Ambulatory Visit: Payer: Self-pay | Admitting: Family Medicine

## 2016-10-15 DIAGNOSIS — Z1231 Encounter for screening mammogram for malignant neoplasm of breast: Secondary | ICD-10-CM

## 2016-10-23 ENCOUNTER — Encounter: Payer: Self-pay | Admitting: Family Medicine

## 2016-10-27 DIAGNOSIS — C3490 Malignant neoplasm of unspecified part of unspecified bronchus or lung: Secondary | ICD-10-CM | POA: Diagnosis not present

## 2016-10-27 DIAGNOSIS — J9611 Chronic respiratory failure with hypoxia: Secondary | ICD-10-CM | POA: Diagnosis not present

## 2016-10-28 ENCOUNTER — Ambulatory Visit (HOSPITAL_COMMUNITY): Payer: Self-pay

## 2016-11-06 ENCOUNTER — Encounter: Payer: Self-pay | Admitting: Family Medicine

## 2016-11-09 ENCOUNTER — Other Ambulatory Visit: Payer: Self-pay | Admitting: Family Medicine

## 2016-11-10 ENCOUNTER — Telehealth: Payer: Self-pay | Admitting: Family Medicine

## 2016-11-10 NOTE — Telephone Encounter (Signed)
okay

## 2016-11-10 NOTE — Telephone Encounter (Signed)
Ok to refill??  Last office visit/ refill 10/07/2016.

## 2016-11-10 NOTE — Telephone Encounter (Signed)
Pt needs refill on oxycodone.  °

## 2016-11-11 MED ORDER — OXYCODONE-ACETAMINOPHEN 7.5-325 MG PO TABS: 1.0000 | ORAL_TABLET | Freq: Three times a day (TID) | ORAL | 0 refills | Status: DC | PRN

## 2016-11-11 NOTE — Telephone Encounter (Signed)
Prescription printed and patient made aware to come to office to pick up after 3 pm on 11/11/2016.

## 2016-11-14 ENCOUNTER — Other Ambulatory Visit: Payer: Self-pay | Admitting: Family Medicine

## 2016-11-16 ENCOUNTER — Other Ambulatory Visit: Payer: Self-pay | Admitting: Family Medicine

## 2016-11-16 ENCOUNTER — Ambulatory Visit (HOSPITAL_COMMUNITY): Payer: Self-pay | Admitting: Oncology

## 2016-11-16 ENCOUNTER — Ambulatory Visit (HOSPITAL_COMMUNITY): Payer: Self-pay

## 2016-11-16 ENCOUNTER — Other Ambulatory Visit (HOSPITAL_COMMUNITY): Payer: Self-pay

## 2016-11-20 ENCOUNTER — Encounter: Payer: Self-pay | Admitting: Family Medicine

## 2016-11-27 DIAGNOSIS — J9611 Chronic respiratory failure with hypoxia: Secondary | ICD-10-CM | POA: Diagnosis not present

## 2016-11-27 DIAGNOSIS — C3490 Malignant neoplasm of unspecified part of unspecified bronchus or lung: Secondary | ICD-10-CM | POA: Diagnosis not present

## 2016-12-08 ENCOUNTER — Telehealth: Payer: Self-pay | Admitting: Family Medicine

## 2016-12-08 MED ORDER — OXYCODONE-ACETAMINOPHEN 7.5-325 MG PO TABS: 1.0000 | ORAL_TABLET | Freq: Three times a day (TID) | ORAL | 0 refills | Status: DC | PRN

## 2016-12-08 NOTE — Telephone Encounter (Signed)
Pt needs refill on percocet

## 2016-12-08 NOTE — Telephone Encounter (Signed)
Ok to refill??  Last office visit 10/07/2016.  Last refill 11/11/2016.  Of note, as of 11/20/2016, patientt with a past due balance at [facility]. Do not schedule until payment or arrangements are made. Account Balance: $40.00

## 2016-12-08 NOTE — Telephone Encounter (Signed)
Okay to refill? 

## 2016-12-08 NOTE — Telephone Encounter (Signed)
Prescription printed and patient made aware to come to office to pick up on 12/09/2016.

## 2016-12-11 ENCOUNTER — Other Ambulatory Visit: Payer: Self-pay | Admitting: Family Medicine

## 2016-12-27 DIAGNOSIS — J9611 Chronic respiratory failure with hypoxia: Secondary | ICD-10-CM | POA: Diagnosis not present

## 2016-12-27 DIAGNOSIS — C3490 Malignant neoplasm of unspecified part of unspecified bronchus or lung: Secondary | ICD-10-CM | POA: Diagnosis not present

## 2016-12-29 ENCOUNTER — Telehealth: Payer: Self-pay | Admitting: *Deleted

## 2016-12-29 NOTE — Telephone Encounter (Signed)
Records indicate prescription refill appropriate for Percocet.   Ok to refill??  Last office visit 10/07/2016.  Last refill 12/08/2016.  Of note, FYI on chart: Pt with a past due balance at [facility] ($40.00) Do not schedule until payment or arrangements are made.

## 2016-12-29 NOTE — Telephone Encounter (Signed)
She has a bill and dismissed will be tapered off pain meds Take 1 pill BID for 2 weeks, then 1/2 tablet BID for 2 weeks, then 1/2 pill daily for 1 week, then stop    #40 pills

## 2017-01-01 MED ORDER — OXYCODONE-ACETAMINOPHEN 7.5-325 MG PO TABS: ORAL_TABLET | ORAL | 0 refills | Status: DC

## 2017-01-01 NOTE — Telephone Encounter (Signed)
Prescription printed with taper instructions and patient made aware to come to office to pick up on Monday per VM per DPR.

## 2017-01-04 ENCOUNTER — Other Ambulatory Visit: Payer: Self-pay | Admitting: Family Medicine

## 2017-01-05 ENCOUNTER — Other Ambulatory Visit: Payer: Self-pay | Admitting: Family Medicine

## 2017-01-06 ENCOUNTER — Telehealth: Payer: Self-pay | Admitting: Family Medicine

## 2017-01-06 NOTE — Telephone Encounter (Signed)
December 29, 2016  Katelyn Rossetti, MD    She has a bill and dismissed will be tapered off pain meds Take 1 pill BID for 2 weeks, then 1/2 tablet BID for 2 weeks, then 1/2 pill daily for 1 week, then stop    #40 pills

## 2017-01-06 NOTE — Telephone Encounter (Signed)
Call placed to patient to inquire. Reports that she has paid balance in full and is requesting full prescription to only have to pay (1) co-pay for medication.   MD please advise.

## 2017-01-06 NOTE — Telephone Encounter (Signed)
Pt needs refill on oxycodone.  °

## 2017-01-07 NOTE — Telephone Encounter (Signed)
This will have to be decided by her PCP.  She may not want the patient back after being dismissed.  She can address Monday.  Patient will have to get current rx as I will not override Dr. Dorian Heckle previous decision.

## 2017-01-07 NOTE — Telephone Encounter (Signed)
Call placed to patient and patient made aware.   Patient will pick up partial prescription.   PCP to review on Monday.

## 2017-01-08 NOTE — Telephone Encounter (Signed)
Received return call from patient.   States that she has thought about it and would rather PCP review and determine if whole prescription can be given on Monday so that she does not have to make 2 separate trips to office, pharmacy and pay 2 co-pays.   MD please advise.

## 2017-01-11 MED ORDER — OXYCODONE-ACETAMINOPHEN 7.5-325 MG PO TABS: 1.0000 | ORAL_TABLET | Freq: Three times a day (TID) | ORAL | 0 refills | Status: DC | PRN

## 2017-01-11 NOTE — Addendum Note (Signed)
Addended by: Sheral Flow on: 01/11/2017 10:50 AM   Modules accepted: Orders

## 2017-01-11 NOTE — Telephone Encounter (Signed)
Prescription printed and patient made aware to come to office to pick up after 2pm on 01/11/2017.  Partial prescription discarded.

## 2017-01-11 NOTE — Telephone Encounter (Signed)
Okay to give her full regular prescription, please discard the other

## 2017-01-27 DIAGNOSIS — C3490 Malignant neoplasm of unspecified part of unspecified bronchus or lung: Secondary | ICD-10-CM | POA: Diagnosis not present

## 2017-01-27 DIAGNOSIS — J9611 Chronic respiratory failure with hypoxia: Secondary | ICD-10-CM | POA: Diagnosis not present

## 2017-02-04 ENCOUNTER — Telehealth: Payer: Self-pay | Admitting: Family Medicine

## 2017-02-04 NOTE — Telephone Encounter (Signed)
Ok to refill??  Last office visit 10/07/2016.  Last refill 01/11/2017.

## 2017-02-04 NOTE — Telephone Encounter (Signed)
Pt needs refill on oxycodone.  °

## 2017-02-05 ENCOUNTER — Other Ambulatory Visit: Payer: Self-pay | Admitting: Family Medicine

## 2017-02-05 MED ORDER — OXYCODONE-ACETAMINOPHEN 7.5-325 MG PO TABS: 1.0000 | ORAL_TABLET | Freq: Three times a day (TID) | ORAL | 0 refills | Status: DC | PRN

## 2017-02-05 NOTE — Telephone Encounter (Signed)
Okay to refill? 

## 2017-02-05 NOTE — Telephone Encounter (Signed)
Prescription printed and patient made aware to come to office to pick up after 3pm on 02/05/2017.

## 2017-02-08 ENCOUNTER — Encounter: Payer: Self-pay | Admitting: Family Medicine

## 2017-02-08 ENCOUNTER — Ambulatory Visit (INDEPENDENT_AMBULATORY_CARE_PROVIDER_SITE_OTHER): Payer: Medicare HMO | Admitting: Family Medicine

## 2017-02-08 VITALS — BP 128/78 | HR 76 | Temp 98.0°F | Resp 16 | Ht 67.0 in | Wt 244.0 lb

## 2017-02-08 DIAGNOSIS — I10 Essential (primary) hypertension: Secondary | ICD-10-CM | POA: Diagnosis not present

## 2017-02-08 DIAGNOSIS — G894 Chronic pain syndrome: Secondary | ICD-10-CM | POA: Diagnosis not present

## 2017-02-08 DIAGNOSIS — E119 Type 2 diabetes mellitus without complications: Secondary | ICD-10-CM | POA: Diagnosis not present

## 2017-02-08 DIAGNOSIS — D45 Polycythemia vera: Secondary | ICD-10-CM | POA: Diagnosis not present

## 2017-02-08 DIAGNOSIS — H6592 Unspecified nonsuppurative otitis media, left ear: Secondary | ICD-10-CM | POA: Diagnosis not present

## 2017-02-08 DIAGNOSIS — Z6837 Body mass index (BMI) 37.0-37.9, adult: Secondary | ICD-10-CM | POA: Diagnosis not present

## 2017-02-08 DIAGNOSIS — J0100 Acute maxillary sinusitis, unspecified: Secondary | ICD-10-CM | POA: Diagnosis not present

## 2017-02-08 DIAGNOSIS — G6289 Other specified polyneuropathies: Secondary | ICD-10-CM

## 2017-02-08 DIAGNOSIS — J9611 Chronic respiratory failure with hypoxia: Secondary | ICD-10-CM | POA: Diagnosis not present

## 2017-02-08 LAB — CBC WITH DIFFERENTIAL/PLATELET
BASOS ABS: 0 {cells}/uL (ref 0–200)
Basophils Relative: 0 %
Eosinophils Absolute: 546 cells/uL — ABNORMAL HIGH (ref 15–500)
Eosinophils Relative: 3 %
HEMATOCRIT: 54.2 % — AB (ref 35.0–45.0)
HEMOGLOBIN: 16 g/dL — AB (ref 12.0–15.0)
LYMPHS ABS: 3094 {cells}/uL (ref 850–3900)
Lymphocytes Relative: 17 %
MCH: 20.2 pg — ABNORMAL LOW (ref 27.0–33.0)
MCHC: 29.5 g/dL — ABNORMAL LOW (ref 32.0–36.0)
MCV: 68.3 fL — ABNORMAL LOW (ref 80.0–100.0)
MONO ABS: 546 {cells}/uL (ref 200–950)
Monocytes Relative: 3 %
NEUTROS PCT: 77 %
Neutro Abs: 14014 cells/uL — ABNORMAL HIGH (ref 1500–7800)
Platelets: 402 10*3/uL — ABNORMAL HIGH (ref 140–400)
RBC: 7.93 MIL/uL — ABNORMAL HIGH (ref 3.80–5.10)
RDW: 21.7 % — ABNORMAL HIGH (ref 11.0–15.0)
WBC: 18.2 10*3/uL — ABNORMAL HIGH (ref 3.8–10.8)

## 2017-02-08 LAB — COMPREHENSIVE METABOLIC PANEL
ALK PHOS: 160 U/L — AB (ref 33–130)
ALT: 9 U/L (ref 6–29)
AST: 10 U/L (ref 10–35)
Albumin: 4.3 g/dL (ref 3.6–5.1)
BILIRUBIN TOTAL: 0.3 mg/dL (ref 0.2–1.2)
BUN: 23 mg/dL (ref 7–25)
CALCIUM: 9.7 mg/dL (ref 8.6–10.4)
CO2: 19 mmol/L — ABNORMAL LOW (ref 20–32)
CREATININE: 0.89 mg/dL (ref 0.50–1.05)
Chloride: 98 mmol/L (ref 98–110)
GLUCOSE: 134 mg/dL — AB (ref 70–99)
Potassium: 4.8 mmol/L (ref 3.5–5.3)
SODIUM: 139 mmol/L (ref 135–146)
Total Protein: 7.3 g/dL (ref 6.1–8.1)

## 2017-02-08 MED ORDER — CEFDINIR 300 MG PO CAPS: 300.0000 mg | ORAL_CAPSULE | Freq: Two times a day (BID) | ORAL | 0 refills | Status: DC

## 2017-02-08 NOTE — Assessment & Plan Note (Signed)
Fasting blood sugars have been trending up. We'll recheck A1c today her goals A1c less than 7% she is on statin drugs and ACE inhibitor

## 2017-02-08 NOTE — Patient Instructions (Addendum)
F/U 4 months  Take antibiotics  Use allegra We will call with lab results

## 2017-02-08 NOTE — Assessment & Plan Note (Signed)
Continues on chronic narcotics no sign of abuse or misuse at this time.

## 2017-02-08 NOTE — Assessment & Plan Note (Signed)
Continue to encourage healthy eating and weight loss. This will help her overall comorbidities.

## 2017-02-08 NOTE — Assessment & Plan Note (Signed)
Continue to use oxygen. She has polycythemia will obtain CBC see if she needs phlebotomy if her numbers are significantly elevated she is also trying to reschedule her appointment with hematology

## 2017-02-08 NOTE — Assessment & Plan Note (Addendum)
Blood pressure is controlled no change to medication

## 2017-02-08 NOTE — Progress Notes (Addendum)
Subjective:    Patient ID: Katelyn Cline, female    DOB: 1960/11/24, 56 y.o.   MRN: [id number]  Patient presents for Follow-up (is fasting)   Mild sinus pressure, post nasal Drip she's also had ear pain the past couple weeks. States that she has some type of illness that last about a month on and off which is why she missed all of her appointments. She has minimal cough at this time she is still using her oxygen as prescribed. She's not had any fever. She did use over-the-counter NyQuil. She is not using any antihistamines for the drainage.    DM-  CBGhave been around 165,  No hypoglycemia , weight down 4lbs , has bene out of lisinopril a couple of day    HTN- taking all other BP medications as prescribed    Polycythemia she's missed her follow-ups with hematology is due for repeat CBC  Review Of Systems:  GEN- denies fatigue, fever, weight loss,weakness, recent illness HEENT- denies eye drainage, change in vision, nasal discharge, CVS- denies chest pain, palpitations RESP- denies SOB, cough, wheeze ABD- denies N/V, change in stools, abd pain GU- denies dysuria, hematuria, dribbling, incontinence MSK- denies joint pain, muscle aches, injury Neuro- denies headache, dizziness, syncope, seizure activity       Objective:    BP 128/78   Pulse 76   Temp 98 F (36.7 C) (Oral)   Resp 16   Ht 5\' 7"  (1.702 m)   Wt 244 lb (110.7 kg)   SpO2 99%   BMI 38.22 kg/m  GEN- NAD, alert and oriented x3 HEENT- PERRL, EOMI, non injected sclera, pink conjunctiva, MMM, oropharynx clear, injected bulging left TM , +fluid, mild erythema right canal, nares clear, mild maxillary sinus tenderness  Neck- Supple, no thyromegaly, no LAD CVS- RRR, no murmur RESP-CTAB ABD-NABS,soft,NT,ND EXT- No edema Pulses- Radial, DP- 2+        Assessment & Plan:      Problem List Items Addressed This Visit    Peripheral neuropathy   Polycythemia vera ([name])   Relevant Medications   cefdinir (OMNICEF)  300 MG capsule   Obesity - Primary    Continue to encourage healthy eating and weight loss. This will help her overall comorbidities.      Essential hypertension, benign    Blood pressure is controlled no change to medication      Relevant Orders   CBC with Differential/Platelet   Comprehensive metabolic panel   Diabetes mellitus type II, controlled ([place])    Fasting blood sugars have been trending up. We'll recheck A1c today her goals A1c less than 7% she is on statin drugs and ACE inhibitor      Relevant Orders   Hemoglobin A1c   Chronic pain disorder    Continues on chronic narcotics no sign of abuse or misuse at this time.      Chronic hypoxemic respiratory failure ([name])    Continue to use oxygen. She has polycythemia will obtain CBC see if she needs phlebotomy if her numbers are significantly elevated she is also trying to reschedule her appointment with hematology       Other Visit Diagnoses    OME (otitis media with effusion), left       Relevant Medications   cefdinir (OMNICEF) 300 MG capsule   Acute maxillary sinusitis, recurrence not specified       Relevant Medications   cefdinir (OMNICEF) 300 MG capsule Also given samples of Allegra to take  Note: This dictation was prepared with Dragon dictation along with smaller phrase technology. Any transcriptional errors that result from this process are unintentional.

## 2017-02-09 LAB — HEMOGLOBIN A1C
Hgb A1c MFr Bld: 7.1 % — ABNORMAL HIGH (ref <5.7)
MEAN PLASMA GLUCOSE: 157 mg/dL

## 2017-02-15 ENCOUNTER — Ambulatory Visit (HOSPITAL_COMMUNITY)
Admission: RE | Admit: 2017-02-15 | Discharge: 2017-02-15 | Disposition: A | Discharge status: Home / Self Care | Payer: Medicare HMO | Source: Ambulatory Visit | Attending: Oncology | Admitting: Oncology

## 2017-02-15 ENCOUNTER — Other Ambulatory Visit (HOSPITAL_COMMUNITY): Payer: Self-pay | Admitting: *Deleted

## 2017-02-15 ENCOUNTER — Ambulatory Visit (HOSPITAL_COMMUNITY)
Admission: RE | Admit: 2017-02-15 | Discharge: 2017-02-15 | Disposition: A | Discharge status: Home / Self Care | Payer: Medicare HMO | Source: Ambulatory Visit | Attending: Family Medicine | Admitting: Family Medicine

## 2017-02-15 DIAGNOSIS — R921 Mammographic calcification found on diagnostic imaging of breast: Secondary | ICD-10-CM | POA: Insufficient documentation

## 2017-02-15 DIAGNOSIS — C439 Malignant melanoma of skin, unspecified: Secondary | ICD-10-CM

## 2017-02-15 DIAGNOSIS — D45 Polycythemia vera: Secondary | ICD-10-CM

## 2017-02-15 DIAGNOSIS — C799 Secondary malignant neoplasm of unspecified site: Secondary | ICD-10-CM

## 2017-02-15 DIAGNOSIS — Z1231 Encounter for screening mammogram for malignant neoplasm of breast: Secondary | ICD-10-CM | POA: Diagnosis not present

## 2017-02-16 ENCOUNTER — Encounter (HOSPITAL_COMMUNITY)
Admission: RE | Admit: 2017-02-16 | Discharge: 2017-02-16 | Disposition: A | Discharge status: Home / Self Care | Payer: Medicare HMO | Source: Ambulatory Visit | Attending: Oncology | Admitting: Oncology

## 2017-02-16 ENCOUNTER — Encounter (HOSPITAL_COMMUNITY): Payer: Self-pay

## 2017-02-16 ENCOUNTER — Emergency Department (HOSPITAL_COMMUNITY)
Admission: EM | Admit: 2017-02-16 | Discharge: 2017-02-16 | Disposition: A | Discharge status: Home / Self Care | Payer: Medicare HMO | Attending: Emergency Medicine | Admitting: Emergency Medicine

## 2017-02-16 ENCOUNTER — Emergency Department (HOSPITAL_COMMUNITY): Payer: Medicare HMO

## 2017-02-16 ENCOUNTER — Encounter (HOSPITAL_COMMUNITY): Payer: Self-pay | Admitting: Emergency Medicine

## 2017-02-16 DIAGNOSIS — M25562 Pain in left knee: Secondary | ICD-10-CM | POA: Diagnosis not present

## 2017-02-16 DIAGNOSIS — C4491 Basal cell carcinoma of skin, unspecified: Secondary | ICD-10-CM | POA: Diagnosis not present

## 2017-02-16 DIAGNOSIS — F17210 Nicotine dependence, cigarettes, uncomplicated: Secondary | ICD-10-CM | POA: Diagnosis not present

## 2017-02-16 DIAGNOSIS — D45 Polycythemia vera: Secondary | ICD-10-CM | POA: Insufficient documentation

## 2017-02-16 DIAGNOSIS — Z91040 Latex allergy status: Secondary | ICD-10-CM | POA: Insufficient documentation

## 2017-02-16 DIAGNOSIS — Z7984 Long term (current) use of oral hypoglycemic drugs: Secondary | ICD-10-CM | POA: Insufficient documentation

## 2017-02-16 DIAGNOSIS — M1712 Unilateral primary osteoarthritis, left knee: Secondary | ICD-10-CM | POA: Insufficient documentation

## 2017-02-16 DIAGNOSIS — Z79899 Other long term (current) drug therapy: Secondary | ICD-10-CM | POA: Insufficient documentation

## 2017-02-16 DIAGNOSIS — C799 Secondary malignant neoplasm of unspecified site: Secondary | ICD-10-CM | POA: Insufficient documentation

## 2017-02-16 DIAGNOSIS — E119 Type 2 diabetes mellitus without complications: Secondary | ICD-10-CM | POA: Diagnosis not present

## 2017-02-16 DIAGNOSIS — I10 Essential (primary) hypertension: Secondary | ICD-10-CM | POA: Insufficient documentation

## 2017-02-16 DIAGNOSIS — M7989 Other specified soft tissue disorders: Secondary | ICD-10-CM | POA: Diagnosis not present

## 2017-02-16 NOTE — ED Provider Notes (Signed)
[facility] DEPT Provider Note   CSN: [id number] Arrival date & time: 02/16/17  0865     History   Chief Complaint Chief Complaint  Patient presents with  . Knee Pain    left    HPI Katelyn Cline is a 56 y.o. female.  The history is provided by the patient.  Knee Pain   The current episode started 2 days ago. The problem occurs hourly. The problem has been gradually worsening. The pain is present in the left knee. The quality of the pain is described as aching. The pain is moderate. Associated symptoms include limited range of motion. Exacerbated by: walking. She has tried rest (pain management meds) for the symptoms. The treatment provided moderate relief.    Past Medical History:  Diagnosis Date  . Agoraphobia   . Basal cell carcinoma   . Chronic pain   . Chronic respiratory failure with hypoxia ([name])    3L [place]  . DM type 2 (diabetes mellitus, type 2) ([place]) 10/16/2013  . Dysrhythmia    Hx: of palpitations "a long time ago"  . Elevated hemoglobin ([place]) 2014  . Fibromyalgia   . History of UTI   . HTN (hypertension)   . Hyperlipidemia   . Major depression   . Metastatic melanoma ([place]) 10/18/2012   12 mm posterior right upper lobe pulmonary nodule, max SUV 3.0    . Migraines   . Miscarriage    x 4  . Narcolepsy   . Panic attacks   . Peripheral neuropathy   . Peripheral neuropathy    in feet  . Polycythemia secondary to smoking   . Polycythemia vera(238.4)   . Scoliosis   . Shortness of breath    Hx: of with activity  . Sleep apnea   . Vertigo     Patient Active Problem List   Diagnosis Date Noted  . Constipation 05/28/2015  . OE (otitis externa) 01/04/2015  . Seborrheic keratoses 08/24/2014  . PVC (premature ventricular contraction) 05/18/2014  . Gastroenteritis 02/15/2014  . MRSA (methicillin resistant staph aureus) culture positive 11/07/2013  . Diabetes mellitus type II, controlled ([place]) 02/06/2013  . Chronic hypoxemic respiratory failure ([place])  02/06/2013  . Basal cell carcinoma of skin 12/01/2012  . Posttraumatic stress disorder 12/01/2012  . Metastatic melanoma ([place]) 10/18/2012  . Polycythemia vera ([place]) 08/05/2012  . Hyperlipidemia 04/09/2012  . Narcolepsy 04/09/2012  . Peripheral neuropathy 04/09/2012  . Tobacco use disorder 04/09/2012  . Obesity 04/09/2012  . Essential hypertension, benign 04/09/2012  . Non-healing skin lesion of nose 04/09/2012  . DDD (degenerative disc disease), lumbar 02/16/2012  . DDD (degenerative disc disease), cervical 02/16/2012  . Chronic pain disorder 02/16/2012  . Depression 02/16/2012  . Fibromyalgia 02/16/2012    Past Surgical History:  Procedure Laterality Date  . BASAL CELL CARCINOMA EXCISION     flap surgery on face  . BREAST LUMPECTOMY     bilateral  . COLONOSCOPY W/ BIOPSIES AND POLYPECTOMY     Hx: of  . CYSTECTOMY     abdominal wall   . DILATION AND CURETTAGE OF UTERUS     Hx: of   . VAGINAL HYSTERECTOMY    . VIDEO ASSISTED THORACOSCOPY (VATS)/WEDGE RESECTION Right 10/21/2012   Procedure: VIDEO ASSISTED THORACOSCOPY (VATS)/WEDGE RESECTION;  Surgeon: [name], MD;  Location: [facility];  Service: Thoracic;  Laterality: Right;  Marland Kitchen VIDEO BRONCHOSCOPY N/A 10/21/2012   Procedure: VIDEO BRONCHOSCOPY;  Surgeon: [name], MD;  Location: [facility];  Service: Thoracic;  Laterality: N/A;    OB History    No data available       Home Medications    Prior to Admission medications   Medication Sig Start Date End Date Taking? Authorizing Provider  amLODipine (NORVASC) 5 MG tablet TAKE 1 TABLET BY MOUTH EVERY DAY 12/17/15   [name], MD  aspirin-acetaminophen-caffeine [facility] MIGRAINE) [phone] MG per tablet Take 1 tablet by mouth every 6 (six) hours as needed for pain.    [provider]  atorvastatin (LIPITOR) 40 MG tablet Take 1 tablet (40 mg total) by mouth daily. 06/01/16   [name], MD  cefdinir (OMNICEF) 300 MG capsule Take 1 capsule (300  mg total) by mouth 2 (two) times daily. 02/08/17   [name], MD  Cholecalciferol (VITAMIN D) 2000 UNITS CAPS Take 1 capsule by mouth daily. Reported on 09/17/2015    [provider]  DULoxetine (CYMBALTA) 60 MG capsule TAKE 2 CAPSULES BY MOUTH EVERY DAY 11/16/16   [name], MD  fenofibrate (TRICOR) 48 MG tablet TAKE 1 TABLET(48 MG) BY MOUTH DAILY 01/05/17   [place], Modena Nunnery, MD  fish oil-omega-3 fatty acids 1000 MG capsule Take 2 g by mouth 2 (two) times daily.    [provider]  gabapentin (NEURONTIN) 100 MG capsule TAKE 1 TO 3 CAPSULES(100 TO 300 MG) BY MOUTH THREE TIMES DAILY AS NEEDED FOR NERVE PAIN 05/28/16   [name], MD  Glucose Blood (BLOOD GLUCOSE TEST STRIPS) STRP Please dispense based on insurance and patient preference. Use as directed to monitor FSBS 1x daily. Dx: E11.9. 09/19/15   [name], MD  ibuprofen (ADVIL,MOTRIN) 800 MG tablet TAKE 1 TABLET BY MOUTH EVERY 8 HOURS AS NEEDED 07/27/16   [place], [place]ena Nunnery, MD  lisinopril (PRINIVIL,ZESTRIL) 5 MG tablet Take 1 tablet (5 mg total) by mouth daily. 10/07/16   [name], MD  metFORMIN (GLUCOPHAGE) 1000 MG tablet TAKE 1 TABLET BY MOUTH TWICE DAILY WITH A MEAL 11/16/16   [name], MD  MICROLET LANCETS MISC TEST AS DIRECTED 05/06/15   [name], MD  oxyCODONE-acetaminophen (PERCOCET) 7.5-325 MG tablet Take 1 tablet by mouth 3 (three) times daily as needed for severe pain. 02/05/17   [name], MD  triamterene-hydrochlorothiazide (DYAZIDE) 37.5-25 MG capsule TAKE 1 CAPSULE BY MOUTH EVERY MORNING 02/05/17   [name], MD    Family History Family History  Problem Relation Age of Onset  . Arthritis Mother   . Hyperlipidemia Mother   . Depression Mother   . Anxiety disorder Mother   . Dementia Mother   . Hypertension Father   . Hyperlipidemia Father   . Heart disease Father   . Stroke Father   . Dementia Father   . Heart disease Brother   . ADD / ADHD  Son   . Alcohol abuse Maternal Grandfather   . Bipolar disorder Neg Hx   . Drug abuse Neg Hx   . OCD Neg Hx   . Paranoid behavior Neg Hx   . Schizophrenia Neg Hx   . Seizures Neg Hx   . Sexual abuse Neg Hx   . Physical abuse Neg Hx     Social History Social History  Substance Use Topics  . Smoking status: Current Every Day Smoker    Packs/day: 1.00    Years: 10.00    Types: Cigarettes    Last attempt to quit: 09/16/2012  . Smokeless tobacco:  Never Used  . Alcohol use Yes     Comment: 1 -2 drinks a month, occasional     Allergies   Contrast media [iodinated diagnostic agents]; Erythromycin; Flagyl [metronidazole]; Latex; Other; Penicillins; and Tetracyclines & related   Review of Systems Review of Systems  Constitutional: Negative for activity change.       All ROS Neg except as noted in HPI  HENT: Negative for nosebleeds.   Eyes: Negative for photophobia and discharge.  Respiratory: Negative for cough, shortness of breath and wheezing.   Cardiovascular: Negative for chest pain and palpitations.  Gastrointestinal: Negative for abdominal pain and blood in stool.  Genitourinary: Negative for dysuria, frequency and hematuria.  Musculoskeletal: Positive for arthralgias. Negative for back pain and neck pain.  Skin: Negative.   Neurological: Negative for dizziness, seizures and speech difficulty.  Psychiatric/Behavioral: Negative for confusion and hallucinations.     Physical Exam Updated Vital Signs BP (!) 164/68 (BP Location: Left Arm)   Pulse 72   Temp 98.1 F (36.7 C) (Oral)   Resp 18   Ht 5\' 8"  (1.727 m)   Wt 110.7 kg (244 lb)   SpO2 93%   BMI 37.10 kg/m   Physical Exam  Constitutional: She is oriented to person, place, and time. She appears well-developed and well-nourished.  Non-toxic appearance.  HENT:  Head: Normocephalic.  Right Ear: Tympanic membrane and external ear normal.  Left Ear: Tympanic membrane and external ear normal.  Nasal oxygen in  place  Eyes: Pupils are equal, round, and reactive to light. EOM and lids are normal.  Neck: Normal range of motion. Neck supple. Carotid bruit is not present.  Cardiovascular: Normal rate, regular rhythm, normal heart sounds, intact distal pulses and normal pulses.   Pulmonary/Chest: Breath sounds normal. No respiratory distress.  Abdominal: Soft. Bowel sounds are normal. There is no tenderness. There is no guarding.  Musculoskeletal:  There is full range of motion of the left hip. There is no visible deformity of the left knee. There is no deformity of the quadricep area. There is crepitus with range of motion of the left knee. There is pain to palpation of the medial aspect of the left knee. There is no noted effusion present. There is no posterior mass appreciated. The dorsalis pedis pulse is 2+ the posterior tibial pulses 2+. Capillary refill is less than 2 seconds.  Lymphadenopathy:       Head (right side): No submandibular adenopathy present.       Head (left side): No submandibular adenopathy present.    She has no cervical adenopathy.  Neurological: She is alert and oriented to person, place, and time. She has normal strength. No cranial nerve deficit or sensory deficit.  Skin: Skin is warm and dry.  Psychiatric: She has a normal mood and affect. Her speech is normal.  Nursing note and vitals reviewed.    ED Treatments / Results  Labs (all labs ordered are listed, but only abnormal results are displayed) Labs Reviewed - No data to display  EKG  EKG Interpretation None       Radiology No results found.  Procedures Procedures (including critical care time)  Medications Ordered in ED Medications - No data to display   Initial Impression / Assessment and Plan / ED Course  I have reviewed the triage vital signs and the nursing notes.  Pertinent labs & imaging results that were available during my care of the patient were reviewed by me and considered in my medical  decision making (see chart for details).      Final Clinical Impressions(s) / ED Diagnoses MDM Vital signs within normal limits. X-ray of the left knee reveals areas of degenerative joint disease present. Some enthesophytes present. I have discussed the findings of the exam as well as the findings of the x-ray with the patient in terms which he understands. The patient was placed in a knee immobilizer. The patient states that she has anti-inflammatory medication and pain medication at home. She will follow with her primary physician concerning the knee issue.    Final diagnoses:  Primary osteoarthritis of left knee    New Prescriptions Discharge Medication List as of 02/16/2017 12:25 PM       Lily Kocher, PA-C 02/17/17 0820    Milton Ferguson, MD 02/19/17 [phone]

## 2017-02-16 NOTE — ED Triage Notes (Signed)
Pain to left knee, rates pain 10/10 when moving.  Denies injury, started 2 days ago.

## 2017-02-16 NOTE — Discharge Instructions (Addendum)
Please use the knee immobilizer when up and about. Use ibuprofen 2 times daily with food. May continue your current pain medication. Please see Dr Luna Glasgow for office evaluation of your knee as soon as possible.

## 2017-02-16 NOTE — Progress Notes (Signed)
Katelyn Cline presents today for phlebotomy per MD orders. HGB/HCT: 16.0 and 54.2 Phlebotomy procedure started at 0824 and ended at 0831 1 lb 7.2oz removed. Patient tolerated procedure well. IV needle removed intact.

## 2017-02-27 DIAGNOSIS — J9611 Chronic respiratory failure with hypoxia: Secondary | ICD-10-CM | POA: Diagnosis not present

## 2017-02-27 DIAGNOSIS — C3490 Malignant neoplasm of unspecified part of unspecified bronchus or lung: Secondary | ICD-10-CM | POA: Diagnosis not present

## 2017-03-05 ENCOUNTER — Other Ambulatory Visit: Payer: Self-pay | Admitting: Family Medicine

## 2017-03-05 MED ORDER — OXYCODONE-ACETAMINOPHEN 7.5-325 MG PO TABS: 1.0000 | ORAL_TABLET | Freq: Three times a day (TID) | ORAL | 0 refills | Status: DC | PRN

## 2017-03-05 NOTE — Telephone Encounter (Signed)
Last OV 8/13 Last refill 8/10 #90 Okay to refill?

## 2017-03-05 NOTE — Telephone Encounter (Signed)
Okay to refill? 

## 2017-03-05 NOTE — Telephone Encounter (Signed)
lvm patient can pick rx up after 2pm today

## 2017-03-05 NOTE — Telephone Encounter (Signed)
Pt needs refill on oxycodone.  °

## 2017-03-08 ENCOUNTER — Other Ambulatory Visit: Payer: Self-pay | Admitting: Family Medicine

## 2017-03-08 NOTE — Telephone Encounter (Signed)
refill appropriate

## 2017-03-09 ENCOUNTER — Ambulatory Visit (HOSPITAL_COMMUNITY)
Admission: RE | Admit: 2017-03-09 | Discharge: 2017-03-09 | Disposition: A | Discharge status: Home / Self Care | Payer: Medicare HMO | Source: Ambulatory Visit | Attending: Oncology | Admitting: Oncology

## 2017-03-09 DIAGNOSIS — I2510 Atherosclerotic heart disease of native coronary artery without angina pectoris: Secondary | ICD-10-CM | POA: Insufficient documentation

## 2017-03-09 DIAGNOSIS — I700 Atherosclerosis of aorta: Secondary | ICD-10-CM | POA: Insufficient documentation

## 2017-03-09 DIAGNOSIS — C439 Malignant melanoma of skin, unspecified: Secondary | ICD-10-CM | POA: Diagnosis not present

## 2017-03-09 DIAGNOSIS — R918 Other nonspecific abnormal finding of lung field: Secondary | ICD-10-CM | POA: Diagnosis not present

## 2017-03-09 DIAGNOSIS — C799 Secondary malignant neoplasm of unspecified site: Secondary | ICD-10-CM | POA: Diagnosis not present

## 2017-03-19 ENCOUNTER — Encounter (HOSPITAL_COMMUNITY): Payer: Medicare HMO | Attending: Oncology | Admitting: Oncology

## 2017-03-19 ENCOUNTER — Encounter (HOSPITAL_COMMUNITY): Payer: Medicare HMO

## 2017-03-19 ENCOUNTER — Encounter (HOSPITAL_COMMUNITY): Payer: Self-pay | Admitting: Oncology

## 2017-03-19 VITALS — BP 151/63 | HR 77 | Resp 18 | Ht 68.0 in | Wt 243.0 lb

## 2017-03-19 DIAGNOSIS — C799 Secondary malignant neoplasm of unspecified site: Secondary | ICD-10-CM

## 2017-03-19 DIAGNOSIS — D45 Polycythemia vera: Secondary | ICD-10-CM | POA: Diagnosis not present

## 2017-03-19 DIAGNOSIS — C439 Malignant melanoma of skin, unspecified: Secondary | ICD-10-CM | POA: Diagnosis not present

## 2017-03-19 LAB — CBC WITH DIFFERENTIAL/PLATELET
BASOS ABS: 0 10*3/uL (ref 0.0–0.1)
BASOS PCT: 0 %
Eosinophils Absolute: 0.5 10*3/uL (ref 0.0–0.7)
Eosinophils Relative: 3 %
HCT: 49.3 % — ABNORMAL HIGH (ref 36.0–46.0)
Hemoglobin: 14.8 g/dL (ref 12.0–15.0)
LYMPHS PCT: 13 %
Lymphs Abs: 2.2 10*3/uL (ref 0.7–4.0)
MCH: 20.2 pg — ABNORMAL LOW (ref 26.0–34.0)
MCHC: 30 g/dL (ref 30.0–36.0)
MCV: 67.3 fL — AB (ref 78.0–100.0)
Monocytes Absolute: 0.5 10*3/uL (ref 0.1–1.0)
Monocytes Relative: 3 %
NEUTROS PCT: 81 %
Neutro Abs: 14.1 10*3/uL — ABNORMAL HIGH (ref 1.7–7.7)
PLATELETS: 451 10*3/uL — AB (ref 150–400)
RBC: 7.33 MIL/uL — AB (ref 3.87–5.11)
RDW: 21.6 % — AB (ref 11.5–15.5)
WBC: 17.3 10*3/uL — AB (ref 4.0–10.5)

## 2017-03-19 MED ORDER — PREDNISONE 50 MG PO TABS
50.0000 mg | ORAL_TABLET | Freq: Every day | ORAL | Status: DC
  Filled 2017-03-19: qty 1

## 2017-03-19 MED ORDER — PREDNISONE 50 MG PO TABS: ORAL_TABLET | ORAL | 0 refills | Status: DC

## 2017-03-19 MED ORDER — DIPHENHYDRAMINE HCL 50 MG PO TABS: 50.0000 mg | ORAL_TABLET | Freq: Once | ORAL | 0 refills | Status: DC

## 2017-03-19 NOTE — Progress Notes (Signed)
Katelyn Cline please call the patient and let her know that her hematocrit is still above 45 % so we need to do another phlebotomy. Please set up one for her. TY

## 2017-03-19 NOTE — Progress Notes (Signed)
[facility] [place], [place], MD [address] [place] [address]    DIAGNOSIS: Metastatic melanoma, BRAF mutation negative             Right lung resection by Dr. Servando Snare on 10/21/2012             JAK 2 positive P. Vera                 SUMMARY OF ONCOLOGIC HISTORY:   Polycythemia vera ([place])   08/05/2012 Initial Diagnosis    Polycythemia vera ([place])       Metastatic melanoma ([place])   10/08/2015 Imaging    CT CAP- Pulmonary nodules are stable to slightly decreased in size. No new sites of metastatic disease in the chest, abdomen or pelvis.       05/06/2016 Imaging    CT CAP- Stable patchy centrilobular ground-glass micronodularity throughout both lungs, upper lung predominant. Stable mosaic attenuation throughout both lungs. This combination of findings raises the possibility of subacute hypersensitivity pneumonitis. However, a few scattered solid pulmonary nodules measuring up to 7 mm in the left lower lobe are new or slightly increased in size, and pulmonary metastases cannot be excluded given this patient's history of pulmonary metastatic melanoma. These nodules are below PET resolution. Continued chest CT surveillance is warranted in 3-6 months.      08/13/2016 Imaging    CT chest- 1. Most previously noted pulmonary nodules appear stable compared to the prior study. The exception is the largest nodule in the left lower lobe which currently measures 8 mm (previously 7 mm on 05/06/2016 and 6 mm on 10/08/2015). 2. Aortic atherosclerosis, in addition to left main and 3 vessel coronary artery disease. Please note that although the presence of coronary artery calcium documents the presence of coronary artery disease, the severity of this disease and any potential stenosis cannot be assessed on this non-gated CT examination. Assessment for potential risk factor modification, dietary therapy or pharmacologic therapy may  be warranted, if clinically indicated. 3. There are calcifications of the aortic valve. Echocardiographic correlation for evaluation of potential valvular dysfunction may be warranted if clinically indicated.       CURRENT THERAPY: Observation        Phlebotomy   INTERVAL HISTORY: Katelyn Cline 56 y.o. female returns for follow-up of a history of stage IV melanoma, primary site not identified and polycythemia vera.  Patient presents today for continued follow up. She states that overall she's been doing well. She continues smoke 1 pack per day. She denies any chest pain, shortness breath, abdominal pain. Her last phlebotomy was last month when she was found to have a hematocrit of 54.2% on 02/08/2017. Patient states she's been having left knee pain, which is chronic for her.   MEDICAL HISTORY: Past Medical History:  Diagnosis Date  . Agoraphobia   . Basal cell carcinoma   . Chronic pain   . Chronic respiratory failure with hypoxia ([name])    3L [place]  . DM type 2 (diabetes mellitus, type 2) ([facility]) 10/16/2013  . Dysrhythmia    Hx: of palpitations "a long time ago"  . Elevated hemoglobin ([facility]) 2014  . Fibromyalgia   . History of UTI   . HTN (hypertension)   . Hyperlipidemia   . Major depression   . Metastatic melanoma ([place]) 10/18/2012   12 mm posterior right upper lobe pulmonary nodule, max SUV 3.0    .  Migraines   . Miscarriage    x 4  . Narcolepsy   . Panic attacks   . Peripheral neuropathy   . Peripheral neuropathy    in feet  . Polycythemia secondary to smoking   . Polycythemia vera(238.4)   . Scoliosis   . Shortness of breath    Hx: of with activity  . Sleep apnea   . Vertigo     has DDD (degenerative disc disease), lumbar; DDD (degenerative disc disease), cervical; Chronic pain disorder; Depression; Fibromyalgia; Hyperlipidemia; Narcolepsy; Peripheral neuropathy; Tobacco use disorder; Obesity; Essential hypertension, benign; Non-healing skin lesion of nose;  Polycythemia vera ([place]); Metastatic melanoma ([facility] [facility]); Diabetes mellitus type II, controlled ([facility]); Chronic hypoxemic respiratory failure ([place]); Basal cell carcinoma of skin; Posttraumatic stress disorder; MRSA (methicillin resistant staph aureus) culture positive; Gastroenteritis; PVC (premature ventricular contraction); Seborrheic keratoses; OE (otitis externa); and Constipation on her problem list.     is allergic to contrast media [iodinated diagnostic agents]; erythromycin; flagyl [metronidazole]; latex; other; penicillins; and tetracyclines & related.  Ms. Ekstrom had no medications administered during this visit.  SURGICAL HISTORY: Past Surgical History:  Procedure Laterality Date  . BASAL CELL CARCINOMA EXCISION     flap surgery on face  . BREAST LUMPECTOMY     bilateral  . COLONOSCOPY W/ BIOPSIES AND POLYPECTOMY     Hx: of  . CYSTECTOMY     abdominal wall   . DILATION AND CURETTAGE OF UTERUS     Hx: of   . VAGINAL HYSTERECTOMY    . VIDEO ASSISTED THORACOSCOPY (VATS)/WEDGE RESECTION Right 10/21/2012   Procedure: VIDEO ASSISTED THORACOSCOPY (VATS)/WEDGE RESECTION;  Surgeon: [name], MD;  Location: [facility];  Service: Thoracic;  Laterality: Right;  Marland Kitchen VIDEO BRONCHOSCOPY N/A 10/21/2012   Procedure: VIDEO BRONCHOSCOPY;  Surgeon: [name], MD;  Location: [facility];  Service: Thoracic;  Laterality: N/A;    SOCIAL HISTORY: Social History   Social History  . Marital status: Divorced    Spouse name: N/A  . Number of children: N/A  . Years of education: 12+   Occupational History  . Not on file.   Social History Main Topics  . Smoking status: Current Every Day Smoker    Packs/day: 1.00    Years: 10.00    Types: Cigarettes    Last attempt to quit: 09/16/2012  . Smokeless tobacco: Never Used  . Alcohol use Yes     Comment: 1 -2 drinks a month, occasional  . Drug use: No  . Sexual activity: Not Currently   Other Topics Concern  . Not on file   Social History  Narrative  . No narrative on file    FAMILY HISTORY: Family History  Problem Relation Age of Onset  . Arthritis Mother   . Hyperlipidemia Mother   . Depression Mother   . Anxiety disorder Mother   . Dementia Mother   . Hypertension Father   . Hyperlipidemia Father   . Heart disease Father   . Stroke Father   . Dementia Father   . Heart disease Brother   . ADD / ADHD Son   . Alcohol abuse Maternal Grandfather   . Bipolar disorder Neg Hx   . Drug abuse Neg Hx   . OCD Neg Hx   . Paranoid behavior Neg Hx   . Schizophrenia Neg Hx   . Seizures Neg Hx   . Sexual abuse Neg Hx   . Physical abuse Neg Hx     Review of  Systems  Constitutional: Positive for malaise/fatigue.  HENT: Negative.   Eyes: Negative.   Respiratory: Negative for shortness of breath.   Cardiovascular: Negative.  Negative for leg swelling.  Gastrointestinal: Negative for abdominal pain and constipation.  Genitourinary: Negative.   Musculoskeletal: Positive for joint pain (left knee) and myalgias (fibromyalgia).  Skin: Negative.   Neurological: Negative.   Endo/Heme/Allergies: Negative.   Psychiatric/Behavioral: Negative.   All other systems reviewed and are negative. 14 point review of systems was performed and is negative except as detailed under history of present illness and above  PHYSICAL EXAMINATION ECOG PERFORMANCE STATUS: 1 - Symptomatic but completely ambulatory  Vitals:   03/19/17 1128  BP: (!) 151/63  Pulse: 77  Resp: 18  SpO2: 93%   Physical Exam  Constitutional: She is oriented to person, place, and time and well-developed, well-nourished, and in no distress.  HENT:  Head: Normocephalic and atraumatic.  Eyes: Pupils are equal, round, and reactive to light. EOM are normal.  Neck: Normal range of motion. Neck supple.  Cardiovascular: Normal rate and normal heart sounds.   Pulmonary/Chest: Effort normal and breath sounds normal.  Abdominal: Soft. Bowel sounds are normal.    Musculoskeletal: Normal range of motion.  Neurological: She is alert and oriented to person, place, and time. Gait normal.  Skin: Skin is warm and dry.  Nursing note and vitals reviewed. 14 point review of systems was performed and is negative except as detailed under history of present illness and above  LABORATORY DATA: I have reviewed the data as listed.   CBC    Component Value Date/Time   WBC 18.2 (H) 02/08/2017 0844   RBC 7.93 (H) 02/08/2017 0844   HGB 16.0 (H) 02/08/2017 0844   HCT 54.2 (H) 02/08/2017 0844   PLT 402 (H) 02/08/2017 0844   MCV 68.3 (L) 02/08/2017 0844   MCH 20.2 (L) 02/08/2017 0844   MCHC 29.5 (L) 02/08/2017 0844   RDW 21.7 (H) 02/08/2017 0844   LYMPHSABS 3,094 02/08/2017 0844   MONOABS 546 02/08/2017 0844   EOSABS 546 (H) 02/08/2017 0844   BASOSABS 0 02/08/2017 0844   CMP     Component Value Date/Time   NA 139 02/08/2017 0844   K 4.8 02/08/2017 0844   CL 98 02/08/2017 0844   CO2 19 (L) 02/08/2017 0844   GLUCOSE 134 (H) 02/08/2017 0844   BUN 23 02/08/2017 0844   CREATININE 0.89 02/08/2017 0844   CALCIUM 9.7 02/08/2017 0844   PROT 7.3 02/08/2017 0844   ALBUMIN 4.3 02/08/2017 0844   AST 10 02/08/2017 0844   ALT 9 02/08/2017 0844   ALKPHOS 160 (H) 02/08/2017 0844   BILITOT 0.3 02/08/2017 0844   GFRNONAA >60 10/08/2015 0925   GFRAA >60 10/08/2015 0925    RADIOLOGY: I have reviewed the images below and agree with the report  CT Chest w/o Contrast 08/13/2016 IMPRESSION: 1. Most previously noted pulmonary nodules appear stable compared to the prior study. The exception is the largest nodule in the left lower lobe which currently measures 8 mm (previously 7 mm on 05/06/2016 and 6 mm on 10/08/2015). 2. Aortic atherosclerosis, in addition to left main and 3 vessel coronary artery disease. Please note that although the presence of coronary artery calcium documents the presence of coronary artery disease, the severity of this disease and any  potential stenosis cannot be assessed on this non-gated CT examination. Assessment for potential risk factor modification, dietary therapy or pharmacologic therapy may be warranted, if clinically indicated. 3.  There are calcifications of the aortic valve. Echocardiographic correlation for evaluation of potential valvular dysfunction may be warranted if clinically indicated.  ASSESSMENT and THERAPY PLAN:  JAK 2 positive Polycythemia Vera Secondary Erythrocytosis Stage IV melanoma  Pulmonary nodules  Polycythemia vera JAK2 positive, polycythemia vera.  Managed with therapeutic phlebotomies. She is intermittently non-compliant.  Goal HCT < 45.  She has periods where she remains compliant and periods where she does not.  I emphasized the importance of preventing disease related complications through maintaining HCT < 45 such as stroke, MI and other thromboembolic complications. She is agreeable to rechecking her CBC today.    Metastatic melanoma CT imaging was reviewed with the patient in detail. We will continue with ongoing observation.   Screening  Reviewed the CT C/A/P from 03/09/17 with her in detail. No recurrence of melanoma or metastatic disease. Stable small pulm nodules. Order repeat surveillance CT CAP in 6 months for her metastatic melanoma.   Goal hct<45%. CBC today.  I will review her labs and call her if she needs a phlebotomy. She will get CBC q3 months.  Discussed continued efforts for smoke cessation.  She will return for follow up in 6 months for follow up.     All questions were answered. The patient knows to call the clinic with any problems, questions or concerns. We can certainly see the patient much sooner if necessary.  Twana First, MD

## 2017-03-25 ENCOUNTER — Other Ambulatory Visit (HOSPITAL_COMMUNITY): Payer: Self-pay

## 2017-03-25 ENCOUNTER — Other Ambulatory Visit (HOSPITAL_COMMUNITY): Payer: Self-pay | Admitting: *Deleted

## 2017-03-25 DIAGNOSIS — D45 Polycythemia vera: Secondary | ICD-10-CM

## 2017-03-26 ENCOUNTER — Encounter (HOSPITAL_COMMUNITY)
Admission: RE | Admit: 2017-03-26 | Discharge: 2017-03-26 | Disposition: A | Discharge status: Home / Self Care | Payer: Medicare HMO | Source: Ambulatory Visit | Attending: Oncology | Admitting: Oncology

## 2017-03-26 DIAGNOSIS — D45 Polycythemia vera: Secondary | ICD-10-CM | POA: Diagnosis not present

## 2017-03-26 NOTE — Progress Notes (Signed)
[name] presents today for phlebotomy per MD orders. HGB/HCT: 14.8/49.3  Phlebotomy procedure started at 1002 and ended at 1010. 1 lb 6 oz removed. Patient tolerated procedure well. IV needle removed intact. drsg to site. Refused po fluids. Wants to go home and go fishing. Refuses to stay longer. 1021-d/c to home in good condition.

## 2017-03-29 DIAGNOSIS — J9611 Chronic respiratory failure with hypoxia: Secondary | ICD-10-CM | POA: Diagnosis not present

## 2017-03-29 DIAGNOSIS — C3490 Malignant neoplasm of unspecified part of unspecified bronchus or lung: Secondary | ICD-10-CM | POA: Diagnosis not present

## 2017-04-02 ENCOUNTER — Telehealth: Payer: Self-pay | Admitting: Family Medicine

## 2017-04-02 MED ORDER — OXYCODONE-ACETAMINOPHEN 7.5-325 MG PO TABS: 1.0000 | ORAL_TABLET | Freq: Three times a day (TID) | ORAL | 0 refills | Status: DC | PRN

## 2017-04-02 NOTE — Telephone Encounter (Signed)
Prescription printed and patient made aware to come to office to pick up after 2pm on 04/02/2017.

## 2017-04-02 NOTE — Telephone Encounter (Signed)
OKAY TO REFILL?

## 2017-04-02 NOTE — Telephone Encounter (Signed)
Ok to refill??  Last office visit 02/08/2017.  Last refill 03/05/2017.

## 2017-04-02 NOTE — Telephone Encounter (Signed)
Patient requesting refill on her percocet  [phone]

## 2017-04-05 ENCOUNTER — Other Ambulatory Visit: Payer: Self-pay | Admitting: Family Medicine

## 2017-04-06 ENCOUNTER — Other Ambulatory Visit: Payer: Self-pay | Admitting: Family Medicine

## 2017-04-06 DIAGNOSIS — R921 Mammographic calcification found on diagnostic imaging of breast: Secondary | ICD-10-CM

## 2017-04-20 ENCOUNTER — Encounter (HOSPITAL_COMMUNITY): Payer: Self-pay

## 2017-04-20 ENCOUNTER — Ambulatory Visit (HOSPITAL_COMMUNITY)
Admission: RE | Admit: 2017-04-20 | Discharge: 2017-04-20 | Disposition: A | Discharge status: Home / Self Care | Payer: Medicare HMO | Source: Ambulatory Visit | Attending: Family Medicine | Admitting: Family Medicine

## 2017-04-20 DIAGNOSIS — R921 Mammographic calcification found on diagnostic imaging of breast: Secondary | ICD-10-CM | POA: Insufficient documentation

## 2017-04-29 ENCOUNTER — Telehealth: Payer: Self-pay | Admitting: Family Medicine

## 2017-04-29 DIAGNOSIS — J9611 Chronic respiratory failure with hypoxia: Secondary | ICD-10-CM | POA: Diagnosis not present

## 2017-04-29 DIAGNOSIS — C3490 Malignant neoplasm of unspecified part of unspecified bronchus or lung: Secondary | ICD-10-CM | POA: Diagnosis not present

## 2017-04-29 NOTE — Telephone Encounter (Signed)
Pt needs refill on oxycodone.  °

## 2017-04-30 MED ORDER — OXYCODONE-ACETAMINOPHEN 7.5-325 MG PO TABS: 1.0000 | ORAL_TABLET | Freq: Three times a day (TID) | ORAL | 0 refills | Status: DC | PRN

## 2017-04-30 NOTE — Telephone Encounter (Signed)
Prescription printed and patient made aware to come to office to pick up after 2pm on 04/30/2017 per VM.

## 2017-04-30 NOTE — Telephone Encounter (Signed)
Ok to refill??  Last office visit 02/08/2017.  Last refill 04/02/2017.

## 2017-04-30 NOTE — Telephone Encounter (Signed)
Okay to refill? 

## 2017-05-03 ENCOUNTER — Other Ambulatory Visit: Payer: Self-pay | Admitting: Family Medicine

## 2017-05-26 ENCOUNTER — Telehealth: Payer: Self-pay | Admitting: Family Medicine

## 2017-05-26 NOTE — Telephone Encounter (Signed)
Okay to refill? 

## 2017-05-26 NOTE — Telephone Encounter (Signed)
Ok to refill??  Last office visit 02/08/2017.  Last refill 04/30/2017.

## 2017-05-26 NOTE — Telephone Encounter (Signed)
Patient requesting rx for her percocet   [phone]

## 2017-05-27 MED ORDER — OXYCODONE-ACETAMINOPHEN 7.5-325 MG PO TABS: 1.0000 | ORAL_TABLET | Freq: Three times a day (TID) | ORAL | 0 refills | Status: DC | PRN

## 2017-05-27 NOTE — Telephone Encounter (Signed)
Prescription printed and patient made aware to come to office to pick up on 05/28/2017.

## 2017-05-29 DIAGNOSIS — J9611 Chronic respiratory failure with hypoxia: Secondary | ICD-10-CM | POA: Diagnosis not present

## 2017-05-29 DIAGNOSIS — C3490 Malignant neoplasm of unspecified part of unspecified bronchus or lung: Secondary | ICD-10-CM | POA: Diagnosis not present

## 2017-05-31 ENCOUNTER — Other Ambulatory Visit: Payer: Self-pay | Admitting: Family Medicine

## 2017-06-11 ENCOUNTER — Other Ambulatory Visit: Payer: Self-pay

## 2017-06-11 ENCOUNTER — Ambulatory Visit: Payer: Medicare HMO | Admitting: Family Medicine

## 2017-06-11 ENCOUNTER — Encounter: Payer: Self-pay | Admitting: Family Medicine

## 2017-06-11 VITALS — BP 136/76 | HR 82 | Temp 98.2°F | Resp 18 | Ht 68.0 in | Wt 242.0 lb

## 2017-06-11 DIAGNOSIS — K089 Disorder of teeth and supporting structures, unspecified: Secondary | ICD-10-CM | POA: Diagnosis not present

## 2017-06-11 DIAGNOSIS — F17200 Nicotine dependence, unspecified, uncomplicated: Secondary | ICD-10-CM | POA: Diagnosis not present

## 2017-06-11 DIAGNOSIS — K047 Periapical abscess without sinus: Secondary | ICD-10-CM | POA: Diagnosis not present

## 2017-06-11 DIAGNOSIS — G894 Chronic pain syndrome: Secondary | ICD-10-CM

## 2017-06-11 DIAGNOSIS — D45 Polycythemia vera: Secondary | ICD-10-CM

## 2017-06-11 DIAGNOSIS — E119 Type 2 diabetes mellitus without complications: Secondary | ICD-10-CM | POA: Diagnosis not present

## 2017-06-11 DIAGNOSIS — Z6836 Body mass index (BMI) 36.0-36.9, adult: Secondary | ICD-10-CM

## 2017-06-11 DIAGNOSIS — E782 Mixed hyperlipidemia: Secondary | ICD-10-CM | POA: Diagnosis not present

## 2017-06-11 DIAGNOSIS — I10 Essential (primary) hypertension: Secondary | ICD-10-CM

## 2017-06-11 DIAGNOSIS — G6289 Other specified polyneuropathies: Secondary | ICD-10-CM

## 2017-06-11 DIAGNOSIS — Z23 Encounter for immunization: Secondary | ICD-10-CM | POA: Diagnosis not present

## 2017-06-11 DIAGNOSIS — M2550 Pain in unspecified joint: Secondary | ICD-10-CM | POA: Diagnosis not present

## 2017-06-11 MED ORDER — CEFDINIR 300 MG PO CAPS: 300.0000 mg | ORAL_CAPSULE | Freq: Two times a day (BID) | ORAL | 0 refills | Status: DC

## 2017-06-11 MED ORDER — VARENICLINE TARTRATE 0.5 MG X 11 & 1 MG X 42 PO MISC: ORAL | 0 refills | Status: DC

## 2017-06-11 MED ORDER — OXYCODONE-ACETAMINOPHEN 7.5-325 MG PO TABS: 1.0000 | ORAL_TABLET | Freq: Three times a day (TID) | ORAL | 0 refills | Status: DC | PRN

## 2017-06-11 NOTE — Assessment & Plan Note (Signed)
Refilled pain medications  Will check Uric acid, for possible gout a few months ago  Has known OA, advised to alterante her NSAID and narcotic medication as needed

## 2017-06-11 NOTE — Assessment & Plan Note (Signed)
Recheck CBC, continues to smoke See if chantix covered by insurance now

## 2017-06-11 NOTE — Progress Notes (Signed)
Subjective:    Patient ID: Katelyn Cline, female    DOB: [birth year], 56 y.o.   MRN: [id number]  Patient presents for Follow-up (is not fasting) Pt here to f/u chronic medical problems. Medications reviewed  Diabetes mellitus her last A1c 7.1% in August, no medication changes were made discussed dietary changes to keep her A1c less than 7%  Polycythemia she has followed up with the cancer center since her last visit.  She last had phlebotomy done September 28  Hyperlipidemia LDL 102 triglycerides were down to 192 in April which is significant improvement for her.  She is on Lipitor 40 mg and TriCor  Hypertension she has all of her medications at home no side effects  Continues to smoke   OA knee- seen in ER/ told to take the ibuprofen , but thinks she may have gout, had pain/swellin gin her Right foot - great toe  Also felt some pain in her wrist   Has facial pain on left side, not sure if sinuses or her gums, no fever, mild drainage, minimal cough, no OTC meds used   Flu shot   Review Of Systems:  GEN- denies fatigue, fever, weight loss,weakness, recent illness HEENT- denies eye drainage, change in vision, nasal discharge, CVS- denies chest pain, palpitations RESP- denies SOB, cough, wheeze ABD- denies N/V, change in stools, abd pain GU- denies dysuria, hematuria, dribbling, incontinence MSK- +joint pain, muscle aches, injury Neuro- denies headache, dizziness, syncope, seizure activity       Objective:    BP 136/76   Pulse 82   Temp 98.2 F (36.8 C) (Oral)   Resp 18   Ht 5\' 8"  (1.727 m)   Wt 242 lb (109.8 kg)   SpO2 98%   BMI 36.80 kg/m  GEN- NAD, alert and oriented x3,smells of heavy tobacco  HEENT- PERRL, EOMI, non injected sclera, pink conjunctiva, MMM, oropharynx swelling upper gumline poor dentition,broken off teeth, TTP upper gulinme, TM clear bilat  Neck- Supple, no thyromegaly CVS- RRR, no murmur RESP-CTAB ABD-NABS,soft,NT,ND EXT- No edema Pulses-  Radial, DP- 2+        Assessment & Plan:      Problem List Items Addressed This Visit      Unprioritized   Peripheral neuropathy   Relevant Medications   varenicline (CHANTIX STARTING MONTH PAK) 0.5 MG X 11 & 1 MG X 42 tablet   Tobacco use disorder   Obesity   Polycythemia vera ([name])    Recheck CBC, continues to smoke See if chantix covered by insurance now      Relevant Medications   cefdinir (OMNICEF) 300 MG capsule   Hyperlipidemia    Improved at last check, check LDL today, as not fasting       Relevant Orders   LDL Cholesterol, Direct   Essential hypertension, benign    BP looks good, no changes      Relevant Orders   Comprehensive metabolic panel   CBC with Differential/Platelet   Diabetes mellitus type II, controlled ([place]) - Primary    Check A1C goal < 7% She admits to eating out a lot They have had difficulties with appliances at home, not cooking very much, but has food Continue MTF      Relevant Orders   Comprehensive metabolic panel   CBC with Differential/Platelet   Hemoglobin A1c   Chronic pain disorder    Refilled pain medications  Will check Uric acid, for possible gout a few months ago  Has known OA,  advised to alterante her NSAID and narcotic medication as needed       Other Visit Diagnoses    Dental abscess       probable early abscess given omnicef, she does not have finances to have teeth removed   Poor dentition       Arthralgia, unspecified joint       Relevant Orders   Uric Acid      Note: This dictation was prepared with Dragon dictation along with smaller phrase technology. Any transcriptional errors that result from this process are unintentional.

## 2017-06-11 NOTE — Patient Instructions (Addendum)
F/U 4 months  Take antibiotics Chantix sent to pharmacy FLu shot done

## 2017-06-11 NOTE — Assessment & Plan Note (Signed)
Improved at last check, check LDL today, as not fasting

## 2017-06-11 NOTE — Assessment & Plan Note (Signed)
Check A1C goal < 7% She admits to eating out a lot They have had difficulties with appliances at home, not cooking very much, but has food Continue MTF

## 2017-06-11 NOTE — Assessment & Plan Note (Signed)
BP looks good, no changes

## 2017-06-11 NOTE — Addendum Note (Signed)
Addended by: Asencion Partridge H on: 06/11/2017 01:00 PM   Modules accepted: Orders

## 2017-06-12 LAB — COMPREHENSIVE METABOLIC PANEL
AG RATIO: 1.3 (calc) (ref 1.0–2.5)
ALT: 9 U/L (ref 6–29)
AST: 11 U/L (ref 10–35)
Albumin: 4 g/dL (ref 3.6–5.1)
Alkaline phosphatase (APISO): 145 U/L — ABNORMAL HIGH (ref 33–130)
BUN: 15 mg/dL (ref 7–25)
CHLORIDE: 100 mmol/L (ref 98–110)
CO2: 32 mmol/L (ref 20–32)
CREATININE: 0.65 mg/dL (ref 0.50–1.05)
Calcium: 9.6 mg/dL (ref 8.6–10.4)
GLOBULIN: 3 g/dL (ref 1.9–3.7)
GLUCOSE: 209 mg/dL — AB (ref 65–99)
Potassium: 5.1 mmol/L (ref 3.5–5.3)
SODIUM: 140 mmol/L (ref 135–146)
TOTAL PROTEIN: 7 g/dL (ref 6.1–8.1)
Total Bilirubin: 0.3 mg/dL (ref 0.2–1.2)

## 2017-06-12 LAB — CBC WITH DIFFERENTIAL/PLATELET
BASOS PCT: 0.8 %
Basophils Absolute: 156 cells/uL (ref 0–200)
EOS PCT: 2.3 %
Eosinophils Absolute: 449 cells/uL (ref 15–500)
HEMATOCRIT: 50.6 % — AB (ref 35.0–45.0)
HEMOGLOBIN: 14.6 g/dL (ref 11.7–15.5)
LYMPHS ABS: 1931 {cells}/uL (ref 850–3900)
MCH: 19.1 pg — ABNORMAL LOW (ref 27.0–33.0)
MCHC: 28.9 g/dL — ABNORMAL LOW (ref 32.0–36.0)
MCV: 66.3 fL — ABNORMAL LOW (ref 80.0–100.0)
MPV: 9.3 fL (ref 7.5–12.5)
Monocytes Relative: 3 %
NEUTROS ABS: 16380 {cells}/uL — AB (ref 1500–7800)
Neutrophils Relative %: 84 %
Platelets: 586 10*3/uL — ABNORMAL HIGH (ref 140–400)
RBC: 7.63 10*6/uL — AB (ref 3.80–5.10)
RDW: 22.9 % — ABNORMAL HIGH (ref 11.0–15.0)
Total Lymphocyte: 9.9 %
WBC: 19.5 10*3/uL — AB (ref 3.8–10.8)
WBCMIX: 585 {cells}/uL (ref 200–950)

## 2017-06-12 LAB — HEMOGLOBIN A1C
EAG (MMOL/L): 8.7 (calc)
Hgb A1c MFr Bld: 7.1 % of total Hgb — ABNORMAL HIGH (ref <5.7)
MEAN PLASMA GLUCOSE: 157 (calc)

## 2017-06-12 LAB — LDL CHOLESTEROL, DIRECT: Direct LDL: 108 mg/dL — ABNORMAL HIGH (ref <100)

## 2017-06-12 LAB — URIC ACID: URIC ACID, SERUM: 6.3 mg/dL (ref 2.5–7.0)

## 2017-06-14 ENCOUNTER — Other Ambulatory Visit (HOSPITAL_COMMUNITY): Payer: Self-pay | Admitting: Emergency Medicine

## 2017-06-14 DIAGNOSIS — D45 Polycythemia vera: Secondary | ICD-10-CM

## 2017-06-16 ENCOUNTER — Other Ambulatory Visit (HOSPITAL_COMMUNITY): Payer: Self-pay

## 2017-06-17 ENCOUNTER — Encounter (HOSPITAL_COMMUNITY)
Admission: RE | Admit: 2017-06-17 | Discharge: 2017-06-17 | Disposition: A | Discharge status: Home / Self Care | Payer: Medicare HMO | Source: Ambulatory Visit | Attending: Oncology | Admitting: Oncology

## 2017-06-17 DIAGNOSIS — D45 Polycythemia vera: Secondary | ICD-10-CM | POA: Diagnosis not present

## 2017-06-17 NOTE — Progress Notes (Signed)
[name] presents today for phlebotomy per MD orders. HGB/HCT:14.6/50.6 Phlebotomy procedure started at 1320 and ended at 1329. 19 oz removed. Patient tolerated procedure well. Refused po fluids. O2  4 l/m in place during procedure per pt request. IV needle removed intact. Drsg to site. D/C to home in good condition.

## 2017-06-24 ENCOUNTER — Other Ambulatory Visit: Payer: Self-pay | Admitting: Family Medicine

## 2017-06-29 DIAGNOSIS — J9611 Chronic respiratory failure with hypoxia: Secondary | ICD-10-CM | POA: Diagnosis not present

## 2017-06-29 DIAGNOSIS — C3490 Malignant neoplasm of unspecified part of unspecified bronchus or lung: Secondary | ICD-10-CM | POA: Diagnosis not present

## 2017-07-21 ENCOUNTER — Other Ambulatory Visit: Payer: Self-pay | Admitting: Family Medicine

## 2017-07-21 NOTE — Telephone Encounter (Signed)
Pt needs refill on oxycodone sent to [facility]

## 2017-07-22 NOTE — Telephone Encounter (Signed)
Ok to refill??  Last office visit/ refill 12/14/218.

## 2017-07-23 MED ORDER — OXYCODONE-ACETAMINOPHEN 7.5-325 MG PO TABS: 1.0000 | ORAL_TABLET | Freq: Three times a day (TID) | ORAL | 0 refills | Status: DC | PRN

## 2017-07-30 DIAGNOSIS — J9611 Chronic respiratory failure with hypoxia: Secondary | ICD-10-CM | POA: Diagnosis not present

## 2017-07-30 DIAGNOSIS — C3490 Malignant neoplasm of unspecified part of unspecified bronchus or lung: Secondary | ICD-10-CM | POA: Diagnosis not present

## 2017-08-05 ENCOUNTER — Other Ambulatory Visit: Payer: Self-pay | Admitting: Family Medicine

## 2017-08-19 ENCOUNTER — Other Ambulatory Visit: Payer: Self-pay | Admitting: Family Medicine

## 2017-08-19 NOTE — Telephone Encounter (Signed)
Refill on oxycodone to [facility]  °

## 2017-08-19 NOTE — Telephone Encounter (Signed)
Ok to refill??  Last office visit 06/11/2017.  Last refill 07/23/2017.

## 2017-08-20 MED ORDER — OXYCODONE-ACETAMINOPHEN 7.5-325 MG PO TABS: 1.0000 | ORAL_TABLET | Freq: Three times a day (TID) | ORAL | 0 refills | Status: DC | PRN

## 2017-08-24 ENCOUNTER — Telehealth: Payer: Self-pay | Admitting: Family Medicine

## 2017-08-24 MED ORDER — OXYCODONE-ACETAMINOPHEN 7.5-325 MG PO TABS: 1.0000 | ORAL_TABLET | Freq: Three times a day (TID) | ORAL | 0 refills | Status: DC | PRN

## 2017-08-24 NOTE — Telephone Encounter (Signed)
MD please advise

## 2017-08-24 NOTE — Telephone Encounter (Signed)
Please change pt pharmacy to [facility], pts oxycodone was sent to [facility] [address] and needs to be changed to correct pharmacy.

## 2017-08-27 DIAGNOSIS — J9611 Chronic respiratory failure with hypoxia: Secondary | ICD-10-CM | POA: Diagnosis not present

## 2017-08-27 DIAGNOSIS — C3490 Malignant neoplasm of unspecified part of unspecified bronchus or lung: Secondary | ICD-10-CM | POA: Diagnosis not present

## 2017-09-01 ENCOUNTER — Other Ambulatory Visit (HOSPITAL_COMMUNITY): Payer: Self-pay

## 2017-09-01 ENCOUNTER — Ambulatory Visit (HOSPITAL_COMMUNITY): Payer: Medicare HMO

## 2017-09-02 ENCOUNTER — Other Ambulatory Visit: Payer: Self-pay | Admitting: Family Medicine

## 2017-09-06 ENCOUNTER — Other Ambulatory Visit (HOSPITAL_COMMUNITY): Payer: Self-pay | Admitting: *Deleted

## 2017-09-06 ENCOUNTER — Ambulatory Visit (HOSPITAL_COMMUNITY): Payer: Self-pay | Admitting: Internal Medicine

## 2017-09-06 ENCOUNTER — Telehealth (HOSPITAL_COMMUNITY): Payer: Self-pay

## 2017-09-06 ENCOUNTER — Other Ambulatory Visit (HOSPITAL_COMMUNITY): Payer: Self-pay

## 2017-09-06 ENCOUNTER — Encounter: Payer: Self-pay | Admitting: *Deleted

## 2017-09-06 DIAGNOSIS — H2513 Age-related nuclear cataract, bilateral: Secondary | ICD-10-CM | POA: Diagnosis not present

## 2017-09-06 DIAGNOSIS — H3509 Other intraretinal microvascular abnormalities: Secondary | ICD-10-CM | POA: Diagnosis not present

## 2017-09-06 DIAGNOSIS — H25013 Cortical age-related cataract, bilateral: Secondary | ICD-10-CM | POA: Diagnosis not present

## 2017-09-06 DIAGNOSIS — H35033 Hypertensive retinopathy, bilateral: Secondary | ICD-10-CM | POA: Diagnosis not present

## 2017-09-06 DIAGNOSIS — E119 Type 2 diabetes mellitus without complications: Secondary | ICD-10-CM | POA: Diagnosis not present

## 2017-09-06 LAB — HM DIABETES EYE EXAM

## 2017-09-06 MED ORDER — PREDNISONE 50 MG PO TABS: ORAL_TABLET | ORAL | 0 refills | Status: DC

## 2017-09-06 MED ORDER — DIPHENHYDRAMINE HCL 50 MG PO TABS: ORAL_TABLET | ORAL | 0 refills | Status: DC

## 2017-09-06 NOTE — Telephone Encounter (Signed)
Message left on patients voicemail to call us back to know which pharmacy she prefers her prescriptions to be sent to for her CT scan on March 26,2019.

## 2017-09-06 NOTE — Telephone Encounter (Signed)
Patient is allergic to contrast media.  She had called clinic asking about medications to take prior to study.  Orders for prednisone 50 mg to take 13hrs, 7 hrs, and 1 hour prior to study and Benadryl 50 mg take 1 hour prior to study.  Orders have been placed.  I spoke with patient via telephone and gave here these orders and she verbalizes understanding in how to take medications.  I told her that it was sent to her pharmacy.  She states that she will take it as instructed.

## 2017-09-07 NOTE — Telephone Encounter (Signed)
Called patient back. She states she uses the [facility] in [place] on [address]. Pre-meds for CT w/contrast sent there.

## 2017-09-16 ENCOUNTER — Other Ambulatory Visit: Payer: Self-pay | Admitting: Family Medicine

## 2017-09-16 NOTE — Telephone Encounter (Signed)
Patient is calling to get refill on her hydrocodone  [facility]

## 2017-09-16 NOTE — Telephone Encounter (Signed)
Ok to refill??  Last office visit 06/11/2017.  Last refill 08/24/2017.

## 2017-09-17 ENCOUNTER — Inpatient Hospital Stay (HOSPITAL_COMMUNITY): Payer: Medicare HMO | Attending: Internal Medicine

## 2017-09-17 DIAGNOSIS — D45 Polycythemia vera: Secondary | ICD-10-CM | POA: Insufficient documentation

## 2017-09-17 LAB — CBC WITH DIFFERENTIAL/PLATELET
Basophils Absolute: 0.1 10*3/uL (ref 0.0–0.1)
Basophils Relative: 0 %
EOS PCT: 2 %
Eosinophils Absolute: 0.5 10*3/uL (ref 0.0–0.7)
HEMATOCRIT: 51.8 % — AB (ref 36.0–46.0)
HEMOGLOBIN: 15 g/dL (ref 12.0–15.0)
LYMPHS ABS: 2.1 10*3/uL (ref 0.7–4.0)
LYMPHS PCT: 9 %
MCH: 19.4 pg — AB (ref 26.0–34.0)
MCHC: 29 g/dL — AB (ref 30.0–36.0)
MCV: 67 fL — AB (ref 78.0–100.0)
MONO ABS: 0.9 10*3/uL (ref 0.1–1.0)
MONOS PCT: 4 %
Neutro Abs: 19.3 10*3/uL — ABNORMAL HIGH (ref 1.7–7.7)
Neutrophils Relative %: 85 %
Platelets: 467 10*3/uL — ABNORMAL HIGH (ref 150–400)
RBC: 7.73 MIL/uL — ABNORMAL HIGH (ref 3.87–5.11)
RDW: 21.2 % — AB (ref 11.5–15.5)
WBC: 22.8 10*3/uL — ABNORMAL HIGH (ref 4.0–10.5)

## 2017-09-17 MED ORDER — OXYCODONE-ACETAMINOPHEN 7.5-325 MG PO TABS: 1.0000 | ORAL_TABLET | Freq: Three times a day (TID) | ORAL | 0 refills | Status: DC | PRN

## 2017-09-20 ENCOUNTER — Other Ambulatory Visit: Payer: Self-pay | Admitting: Family Medicine

## 2017-09-21 ENCOUNTER — Ambulatory Visit (HOSPITAL_COMMUNITY)
Admission: RE | Admit: 2017-09-21 | Discharge: 2017-09-21 | Disposition: A | Discharge status: Home / Self Care | Payer: Medicare HMO | Source: Ambulatory Visit | Attending: Oncology | Admitting: Oncology

## 2017-09-21 ENCOUNTER — Other Ambulatory Visit (HOSPITAL_COMMUNITY): Payer: Self-pay | Admitting: Oncology

## 2017-09-21 DIAGNOSIS — C799 Secondary malignant neoplasm of unspecified site: Secondary | ICD-10-CM

## 2017-09-21 DIAGNOSIS — K760 Fatty (change of) liver, not elsewhere classified: Secondary | ICD-10-CM | POA: Insufficient documentation

## 2017-09-21 DIAGNOSIS — C439 Malignant melanoma of skin, unspecified: Secondary | ICD-10-CM

## 2017-09-21 DIAGNOSIS — R918 Other nonspecific abnormal finding of lung field: Secondary | ICD-10-CM | POA: Diagnosis not present

## 2017-09-21 DIAGNOSIS — I700 Atherosclerosis of aorta: Secondary | ICD-10-CM | POA: Insufficient documentation

## 2017-09-21 DIAGNOSIS — R160 Hepatomegaly, not elsewhere classified: Secondary | ICD-10-CM | POA: Insufficient documentation

## 2017-09-24 ENCOUNTER — Ambulatory Visit (HOSPITAL_COMMUNITY): Payer: Self-pay | Admitting: Internal Medicine

## 2017-09-27 DIAGNOSIS — J9611 Chronic respiratory failure with hypoxia: Secondary | ICD-10-CM | POA: Diagnosis not present

## 2017-09-27 DIAGNOSIS — C3490 Malignant neoplasm of unspecified part of unspecified bronchus or lung: Secondary | ICD-10-CM | POA: Diagnosis not present

## 2017-09-28 ENCOUNTER — Inpatient Hospital Stay (HOSPITAL_COMMUNITY): Payer: Medicare HMO | Attending: Internal Medicine | Admitting: Internal Medicine

## 2017-10-03 ENCOUNTER — Other Ambulatory Visit: Payer: Self-pay | Admitting: Family Medicine

## 2017-10-11 ENCOUNTER — Other Ambulatory Visit: Payer: Self-pay

## 2017-10-11 ENCOUNTER — Encounter: Payer: Self-pay | Admitting: Family Medicine

## 2017-10-11 ENCOUNTER — Ambulatory Visit (INDEPENDENT_AMBULATORY_CARE_PROVIDER_SITE_OTHER): Payer: Medicare HMO | Admitting: Family Medicine

## 2017-10-11 VITALS — BP 132/64 | HR 88 | Temp 98.2°F | Resp 16 | Ht 68.0 in | Wt 240.0 lb

## 2017-10-11 DIAGNOSIS — E119 Type 2 diabetes mellitus without complications: Secondary | ICD-10-CM | POA: Diagnosis not present

## 2017-10-11 DIAGNOSIS — Z114 Encounter for screening for human immunodeficiency virus [HIV]: Secondary | ICD-10-CM | POA: Diagnosis not present

## 2017-10-11 DIAGNOSIS — E782 Mixed hyperlipidemia: Secondary | ICD-10-CM

## 2017-10-11 DIAGNOSIS — Z6836 Body mass index (BMI) 36.0-36.9, adult: Secondary | ICD-10-CM | POA: Diagnosis not present

## 2017-10-11 DIAGNOSIS — S4991XA Unspecified injury of right shoulder and upper arm, initial encounter: Secondary | ICD-10-CM | POA: Diagnosis not present

## 2017-10-11 DIAGNOSIS — G894 Chronic pain syndrome: Secondary | ICD-10-CM

## 2017-10-11 DIAGNOSIS — D45 Polycythemia vera: Secondary | ICD-10-CM | POA: Diagnosis not present

## 2017-10-11 DIAGNOSIS — G6289 Other specified polyneuropathies: Secondary | ICD-10-CM

## 2017-10-11 DIAGNOSIS — J9611 Chronic respiratory failure with hypoxia: Secondary | ICD-10-CM | POA: Diagnosis not present

## 2017-10-11 DIAGNOSIS — I10 Essential (primary) hypertension: Secondary | ICD-10-CM | POA: Diagnosis not present

## 2017-10-11 DIAGNOSIS — Z1159 Encounter for screening for other viral diseases: Secondary | ICD-10-CM | POA: Diagnosis not present

## 2017-10-11 MED ORDER — OXYCODONE-ACETAMINOPHEN 7.5-325 MG PO TABS: 1.0000 | ORAL_TABLET | Freq: Three times a day (TID) | ORAL | 0 refills | Status: DC | PRN

## 2017-10-11 NOTE — Patient Instructions (Addendum)
F/U 4 months Physical  We will call with lab results  Medications refilled

## 2017-10-11 NOTE — Progress Notes (Signed)
   Subjective:    Patient ID: Katelyn Cline, female    DOB: 1960-09-13, 57 y.o.   MRN: [id number]  Patient presents for Follow-up (is fasting)   Pt here to f/u chronic medical problems   DM- last A1C  7.1%, states BS have been high over 200 fasting. Taking metformin most days, misses breakfast    No hypoglycemia symptoms     HTN- taking Bp meds as prescribed no changes   Hyperlipidemia - on lipitor and tricor, TG  192 in December LDL 108   Request refill pain medications   Due for fasting labs  Unable to go for colonoscopy   Seen by eye Dr. Herbert Deaner   Right shoulder pain for past few weeks, no injury, no swelling,or clicking,able to use but has some discomfort   Review Of Systems:  GEN- denies fatigue, fever, weight loss,weakness, recent illness HEENT- denies eye drainage, change in vision, nasal discharge, CVS- denies chest pain, palpitations RESP- denies SOB, cough, wheeze ABD- denies N/V, change in stools, abd pain GU- denies dysuria, hematuria, dribbling, incontinence MSK- + joint pain, muscle aches, injury Neuro- denies headache, dizziness, syncope, seizure activity       Objective:    BP 132/64   Pulse 88   Temp 98.2 F (36.8 C) (Oral)   Resp 16   Ht 5\' 8"  (1.727 m)   Wt 240 lb (108.9 kg)   SpO2 90%   BMI 36.49 kg/m  GEN- NAD, alert and oriented x3 HEENT- PERRL, EOMI, non injected sclera, pink conjunctiva, MMM, oropharynx clear Neck- Supple, no thyromegaly CVS- RRR, no murmur RESP-CTAB ABD-NABS,soft,NT,ND MSK- good ROM, neg empty can, TTP near AC, biceps in tact, strength equal bilat  EXT- No edema Pulses- Radial, DP- 2+        Assessment & Plan:      Problem List Items Addressed This Visit      Unprioritized   Peripheral neuropathy   Obesity   Polycythemia vera ([place])   Hyperlipidemia   Relevant Orders   Lipid panel (Completed)   Essential hypertension, benign    controlled      Diabetes mellitus type II, controlled ([place])   Recheck labs, goal A1C < 7% Discussed diet, lower carb/sugar Would benefit from weight loss of at least 20lbs      Relevant Orders   CBC with Differential/Platelet (Completed)   Comprehensive metabolic panel (Completed)   Hemoglobin A1c (Completed)   HM DIABETES FOOT EXAM (Completed)   Chronic pain disorder - Primary    Pain meds refilled      Chronic hypoxemic respiratory failure ([name])    Continue oxygen therapy Has polycythema followed by hematology       Other Visit Diagnoses    Encounter for screening for HIV       Relevant Orders   HIV antibody (Completed)   Need for hepatitis C screening test       Relevant Orders   Hepatitis C antibody (Completed)   Injury of right shoulder, initial encounter       use NSAID, pain meds, home exercises , may have some OA, and strain,       Note: This dictation was prepared with Dragon dictation along with smaller phrase technology. Any transcriptional errors that result from this process are unintentional.

## 2017-10-12 ENCOUNTER — Encounter: Payer: Self-pay | Admitting: Family Medicine

## 2017-10-12 ENCOUNTER — Other Ambulatory Visit: Payer: Self-pay | Admitting: *Deleted

## 2017-10-12 LAB — COMPREHENSIVE METABOLIC PANEL WITH GFR
AG Ratio: 1.2 (calc) (ref 1.0–2.5)
ALT: 5 U/L — ABNORMAL LOW (ref 6–29)
AST: 7 U/L — ABNORMAL LOW (ref 10–35)
Albumin: 3.8 g/dL (ref 3.6–5.1)
Alkaline phosphatase (APISO): 172 U/L — ABNORMAL HIGH (ref 33–130)
BUN: 17 mg/dL (ref 7–25)
CO2: 23 mmol/L (ref 20–32)
Calcium: 9.2 mg/dL (ref 8.6–10.4)
Chloride: 102 mmol/L (ref 98–110)
Creat: 0.68 mg/dL (ref 0.50–1.05)
Globulin: 3.2 g/dL (ref 1.9–3.7)
Glucose, Bld: 264 mg/dL — ABNORMAL HIGH (ref 65–99)
Potassium: 5 mmol/L (ref 3.5–5.3)
Sodium: 138 mmol/L (ref 135–146)
Total Bilirubin: 0.3 mg/dL (ref 0.2–1.2)
Total Protein: 7 g/dL (ref 6.1–8.1)

## 2017-10-12 LAB — CBC WITH DIFFERENTIAL/PLATELET
Basophils Absolute: 145 {cells}/uL (ref 0–200)
Basophils Relative: 0.7 %
Eosinophils Absolute: 476 {cells}/uL (ref 15–500)
Eosinophils Relative: 2.3 %
HCT: 49.3 % — ABNORMAL HIGH (ref 35.0–45.0)
Hemoglobin: 14.3 g/dL (ref 11.7–15.5)
Lymphs Abs: 1635 {cells}/uL (ref 850–3900)
MCH: 18.6 pg — ABNORMAL LOW (ref 27.0–33.0)
MCHC: 29 g/dL — ABNORMAL LOW (ref 32.0–36.0)
MCV: 64.2 fL — ABNORMAL LOW (ref 80.0–100.0)
MPV: 9.4 fL (ref 7.5–12.5)
Monocytes Relative: 2.4 %
Neutro Abs: 17947 {cells}/uL — ABNORMAL HIGH (ref 1500–7800)
Neutrophils Relative %: 86.7 %
Platelets: 606 Thousand/uL — ABNORMAL HIGH (ref 140–400)
RBC: 7.68 Million/uL — ABNORMAL HIGH (ref 3.80–5.10)
RDW: 22.2 % — ABNORMAL HIGH (ref 11.0–15.0)
Total Lymphocyte: 7.9 %
WBC mixed population: 497 {cells}/uL (ref 200–950)
WBC: 20.7 Thousand/uL — ABNORMAL HIGH (ref 3.8–10.8)

## 2017-10-12 LAB — HEMOGLOBIN A1C
Hgb A1c MFr Bld: 7.6 %{Hb} — ABNORMAL HIGH
Mean Plasma Glucose: 171 (calc)
eAG (mmol/L): 9.5 (calc)

## 2017-10-12 LAB — LIPID PANEL
CHOL/HDL RATIO: 5.3 (calc) — AB (ref <5.0)
Cholesterol: 155 mg/dL (ref <200)
HDL: 29 mg/dL — ABNORMAL LOW (ref >50)
LDL CHOLESTEROL (CALC): 96 mg/dL
NON-HDL CHOLESTEROL (CALC): 126 mg/dL (ref <130)
TRIGLYCERIDES: 200 mg/dL — AB (ref <150)

## 2017-10-12 LAB — HIV ANTIBODY (ROUTINE TESTING W REFLEX): HIV 1&2 Ab, 4th Generation: NONREACTIVE

## 2017-10-12 LAB — HEPATITIS C ANTIBODY
Hepatitis C Ab: NONREACTIVE
SIGNAL TO CUT-OFF: 0.01 (ref <1.00)

## 2017-10-12 MED ORDER — MICROLET LANCETS MISC: 11 refills | Status: DC

## 2017-10-12 MED ORDER — BLOOD GLUCOSE TEST VI STRP: ORAL_STRIP | 3 refills | Status: DC

## 2017-10-12 NOTE — Assessment & Plan Note (Signed)
Pain meds refilled 

## 2017-10-12 NOTE — Assessment & Plan Note (Signed)
Recheck labs, goal A1C < 7% Discussed diet, lower carb/sugar Would benefit from weight loss of at least 20lbs

## 2017-10-12 NOTE — Assessment & Plan Note (Signed)
controlled 

## 2017-10-12 NOTE — Assessment & Plan Note (Signed)
Continue oxygen therapy Has polycythema followed by hematology

## 2017-10-19 ENCOUNTER — Other Ambulatory Visit: Payer: Self-pay | Admitting: *Deleted

## 2017-10-19 MED ORDER — ATORVASTATIN CALCIUM 80 MG PO TABS: 80.0000 mg | ORAL_TABLET | Freq: Every day | ORAL | 3 refills | Status: DC

## 2017-10-19 MED ORDER — EMPAGLIFLOZIN 25 MG PO TABS: 25.0000 mg | ORAL_TABLET | Freq: Every day | ORAL | 3 refills | Status: DC

## 2017-10-27 DIAGNOSIS — C3490 Malignant neoplasm of unspecified part of unspecified bronchus or lung: Secondary | ICD-10-CM | POA: Diagnosis not present

## 2017-10-27 DIAGNOSIS — J9611 Chronic respiratory failure with hypoxia: Secondary | ICD-10-CM | POA: Diagnosis not present

## 2017-11-03 ENCOUNTER — Other Ambulatory Visit: Payer: Self-pay | Admitting: Family Medicine

## 2017-11-09 ENCOUNTER — Other Ambulatory Visit: Payer: Self-pay | Admitting: Family Medicine

## 2017-11-09 MED ORDER — OXYCODONE-ACETAMINOPHEN 7.5-325 MG PO TABS: 1.0000 | ORAL_TABLET | Freq: Three times a day (TID) | ORAL | 0 refills | Status: DC | PRN

## 2017-11-09 NOTE — Telephone Encounter (Signed)
Refill on percocet to [facility].

## 2017-11-09 NOTE — Telephone Encounter (Signed)
Ok to refill??  Last office visit/ refill 10/11/2017.

## 2017-11-27 DIAGNOSIS — J9611 Chronic respiratory failure with hypoxia: Secondary | ICD-10-CM | POA: Diagnosis not present

## 2017-11-27 DIAGNOSIS — C3490 Malignant neoplasm of unspecified part of unspecified bronchus or lung: Secondary | ICD-10-CM | POA: Diagnosis not present

## 2017-12-07 ENCOUNTER — Other Ambulatory Visit: Payer: Self-pay | Admitting: Family Medicine

## 2017-12-07 MED ORDER — OXYCODONE-ACETAMINOPHEN 7.5-325 MG PO TABS: 1.0000 | ORAL_TABLET | Freq: Three times a day (TID) | ORAL | 0 refills | Status: DC | PRN

## 2017-12-07 NOTE — Telephone Encounter (Signed)
Refill on oxycodone to [facility]  °

## 2017-12-07 NOTE — Telephone Encounter (Signed)
Ok to refill??  Last office visit 10/11/2017.  Last refill 11/09/2017.

## 2017-12-08 ENCOUNTER — Other Ambulatory Visit: Payer: Self-pay | Admitting: Family Medicine

## 2017-12-24 ENCOUNTER — Other Ambulatory Visit: Payer: Self-pay | Admitting: Family Medicine

## 2017-12-27 DIAGNOSIS — C3490 Malignant neoplasm of unspecified part of unspecified bronchus or lung: Secondary | ICD-10-CM | POA: Diagnosis not present

## 2017-12-27 DIAGNOSIS — J9611 Chronic respiratory failure with hypoxia: Secondary | ICD-10-CM | POA: Diagnosis not present

## 2018-01-05 ENCOUNTER — Other Ambulatory Visit: Payer: Self-pay | Admitting: Family Medicine

## 2018-01-05 MED ORDER — OXYCODONE-ACETAMINOPHEN 7.5-325 MG PO TABS: 1.0000 | ORAL_TABLET | Freq: Three times a day (TID) | ORAL | 0 refills | Status: DC | PRN

## 2018-01-05 NOTE — Telephone Encounter (Signed)
Ok to refill??  Last office visit 10/11/2017.  Last refill 12/07/2017.

## 2018-01-05 NOTE — Telephone Encounter (Signed)
[phone]  Patient requesting refill on her oxycodone  [facility]

## 2018-01-07 ENCOUNTER — Other Ambulatory Visit: Payer: Self-pay | Admitting: Family Medicine

## 2018-01-27 DIAGNOSIS — C3490 Malignant neoplasm of unspecified part of unspecified bronchus or lung: Secondary | ICD-10-CM | POA: Diagnosis not present

## 2018-01-27 DIAGNOSIS — J9611 Chronic respiratory failure with hypoxia: Secondary | ICD-10-CM | POA: Diagnosis not present

## 2018-01-31 ENCOUNTER — Other Ambulatory Visit: Payer: Self-pay | Admitting: Family Medicine

## 2018-01-31 MED ORDER — OXYCODONE-ACETAMINOPHEN 7.5-325 MG PO TABS: 1.0000 | ORAL_TABLET | Freq: Three times a day (TID) | ORAL | 0 refills | Status: DC | PRN

## 2018-01-31 NOTE — Telephone Encounter (Signed)
Refill on oxycodone to [facility]  °

## 2018-01-31 NOTE — Telephone Encounter (Signed)
Ok to refill??  Last office visit 10/11/2017.  Last refill 01/05/2018.

## 2018-02-03 ENCOUNTER — Other Ambulatory Visit: Payer: Self-pay | Admitting: Family Medicine

## 2018-02-21 ENCOUNTER — Other Ambulatory Visit: Payer: Self-pay

## 2018-02-21 ENCOUNTER — Ambulatory Visit (INDEPENDENT_AMBULATORY_CARE_PROVIDER_SITE_OTHER): Payer: Medicare HMO | Admitting: Family Medicine

## 2018-02-21 ENCOUNTER — Other Ambulatory Visit: Payer: Self-pay | Admitting: *Deleted

## 2018-02-21 ENCOUNTER — Encounter: Payer: Self-pay | Admitting: Family Medicine

## 2018-02-21 VITALS — BP 138/72 | HR 82 | Temp 98.6°F | Resp 16 | Ht 68.0 in | Wt 232.0 lb

## 2018-02-21 DIAGNOSIS — E782 Mixed hyperlipidemia: Secondary | ICD-10-CM | POA: Diagnosis not present

## 2018-02-21 DIAGNOSIS — E119 Type 2 diabetes mellitus without complications: Secondary | ICD-10-CM | POA: Diagnosis not present

## 2018-02-21 DIAGNOSIS — I700 Atherosclerosis of aorta: Secondary | ICD-10-CM

## 2018-02-21 DIAGNOSIS — M797 Fibromyalgia: Secondary | ICD-10-CM

## 2018-02-21 DIAGNOSIS — M5030 Other cervical disc degeneration, unspecified cervical region: Secondary | ICD-10-CM

## 2018-02-21 DIAGNOSIS — D45 Polycythemia vera: Secondary | ICD-10-CM | POA: Diagnosis not present

## 2018-02-21 DIAGNOSIS — Z6835 Body mass index (BMI) 35.0-35.9, adult: Secondary | ICD-10-CM

## 2018-02-21 DIAGNOSIS — Z85828 Personal history of other malignant neoplasm of skin: Secondary | ICD-10-CM | POA: Diagnosis not present

## 2018-02-21 DIAGNOSIS — Z0000 Encounter for general adult medical examination without abnormal findings: Secondary | ICD-10-CM

## 2018-02-21 DIAGNOSIS — K122 Cellulitis and abscess of mouth: Secondary | ICD-10-CM | POA: Diagnosis not present

## 2018-02-21 DIAGNOSIS — M5136 Other intervertebral disc degeneration, lumbar region: Secondary | ICD-10-CM

## 2018-02-21 DIAGNOSIS — M51369 Other intervertebral disc degeneration, lumbar region without mention of lumbar back pain or lower extremity pain: Secondary | ICD-10-CM

## 2018-02-21 DIAGNOSIS — Z85820 Personal history of malignant melanoma of skin: Secondary | ICD-10-CM

## 2018-02-21 DIAGNOSIS — S025XXS Fracture of tooth (traumatic), sequela: Secondary | ICD-10-CM

## 2018-02-21 DIAGNOSIS — G6289 Other specified polyneuropathies: Secondary | ICD-10-CM

## 2018-02-21 DIAGNOSIS — E66812 Obesity, class 2: Secondary | ICD-10-CM

## 2018-02-21 DIAGNOSIS — I10 Essential (primary) hypertension: Secondary | ICD-10-CM

## 2018-02-21 DIAGNOSIS — G894 Chronic pain syndrome: Secondary | ICD-10-CM

## 2018-02-21 DIAGNOSIS — F17200 Nicotine dependence, unspecified, uncomplicated: Secondary | ICD-10-CM

## 2018-02-21 MED ORDER — OXYCODONE-ACETAMINOPHEN 7.5-325 MG PO TABS: 1.0000 | ORAL_TABLET | Freq: Three times a day (TID) | ORAL | 0 refills | Status: DC | PRN

## 2018-02-21 MED ORDER — TIZANIDINE HCL 4 MG PO TABS: 4.0000 mg | ORAL_TABLET | Freq: Two times a day (BID) | ORAL | 2 refills | Status: DC | PRN

## 2018-02-21 MED ORDER — CLINDAMYCIN HCL 300 MG PO CAPS: 300.0000 mg | ORAL_CAPSULE | Freq: Three times a day (TID) | ORAL | 0 refills | Status: DC

## 2018-02-21 MED ORDER — TETANUS-DIPHTH-ACELL PERTUSSIS 5-2.5-18.5 LF-MCG/0.5 IM SUSP: 0.5000 mL | Freq: Once | INTRAMUSCULAR | 0 refills | Status: AC

## 2018-02-21 MED ORDER — CEFTRIAXONE SODIUM 1 G IJ SOLR
1.0000 g | Freq: Once | INTRAMUSCULAR | Status: AC
  Administered 2018-02-21: 1 g via INTRAMUSCULAR

## 2018-02-21 MED ORDER — BLOOD GLUCOSE SYSTEM PAK KIT: PACK | 1 refills | Status: DC

## 2018-02-21 NOTE — Telephone Encounter (Signed)
Ok to refill??  Last refill 01/31/2018.

## 2018-02-21 NOTE — Patient Instructions (Addendum)
Tetanus Booster sent to pharmacy Referral to pain management  Referral updated to cancer center  Call the oncologist for an appointment  New diabetes meter sent to pharmacy  cologaurd F/U 4 months

## 2018-02-21 NOTE — Progress Notes (Signed)
Subjective:   Patient presents for Medicare Annual/Subsequent preventive examination.    Pt here for wellness exam    Medications reviewed   Sinus pressure and pain, upper tooth is infected, pain in ear and face  for past week, had low grade temp - subjective . Has had drainage from tooth. Was more sleepy this weekend as well    [facility] and metastatic melanoma - had recent CT in Spring, no recurrent disease   DM- last A1C  7.6% in April , Jardiance added at last visit to metformin  Lipitor also increaesd to 80mg  goal LDL < 100, continued on Tricor - Noted Aortic atherosclerosis on scan   Changes in hearing for quite some time.    DM- has not checked blood sugars in months states her machine broke   Asking for increase in her pain medications. Taking ibuprofen as well , also asked for muscle relaxer.  States that her back hurts all the time she is sedentary at 90% of the time states that the pain makes her mood worse and she feels more depressed at times but not tearful or anxious and that makes her more pain and that she cannot move around she also not following up with her own health she has missed appointments with her oncologist not checking her blood sugars is taking her Cymbalta as prescribed             Review Past Medical/Family/Social: Per EMR   Risk Factors  Current exercise habits: none Dietary issues discussed: Yes  Cardiac risk factors: Obesity (BMI >= 30 kg/m2). DM, HTN  Depression Screen  PHQ 9 SCORE  16 (Note: if answer to either of the following is "Yes", a more complete depression screening is indicated)  Over the past two weeks, have you felt down, depressed or hopeless? Yes Over the past two weeks, have you felt little interest or pleasure in doing things? Yes Have you lost interest or pleasure in daily life? No Do you often feel hopeless? Yes when in pain  Do you cry easily over simple problems? No   Activities of Daily Living  In your present state of  health, do you have any difficulty performing the following activities?:  Driving? No  Managing money? No  Feeding yourself? No  Getting from bed to chair? No  Climbing a flight of stairs? Yes  Preparing food and eating?: Yes Bathing or showering? Yes  Getting dressed: No  Getting to the toilet? No  Using the toilet:No  Moving around from place to place: Yes  In the past year have you fallen or had a near fall?:No  Are you sexually active? No  Do you have more than one partner? No   Hearing Difficulties: Yes - For years  Do you often ask people to speak up or repeat themselves? Y Do you experience ringing or noises in your ears? Y Do you have difficulty understanding soft or whispered voices? Y  Do you feel that you have a problem with memory? Y Do you often misplace items?Yo  Do you feel safe at home? Yes  Cognitive Testing  Alert? Yes Normal Appearance?Yes  Oriented to person? Yes Place? Yes  Time? Yes  Recall of three objects? Yes  Can perform simple calculations? Yes  Displays appropriate judgment?Yes  Can read the correct time from a watch face?Yes   List the Names of Other Physician/Practitioners you currently use:  Oncology, [facility] care     Screening Tests / Date  Colonoscopy DUE                      Zostavax  Declines due to cost  Mammogram  UTD Influenza Vaccine  Tetanus/tdap - Due Pneumonia vaccine- Due  ROS: GEN- denies fatigue, +fever, weight loss,weakness, recent illness HEENT- denies eye drainage, change in vision, nasal discharge, CVS- denies chest pain, palpitations RESP- denies SOB, cough, wheeze ABD- denies N/V, change in stools, abd pain GU- denies dysuria, hematuria, dribbling, incontinence MSK-+ joint pain, muscle aches, injury Neuro- denies headache, dizziness, syncope, seizure activity   Physical: GEN- NAD, alert and oriented x3 HEENT- PERRL, EOMI, non injected sclera, pink conjunctiva, MMM, TM clear, little clear fluid Right ear,  Clear left ear, nares clear, multiple broken off teeth in gumline, Right upper gumline, abscess, with erythema and swelling, TTP , mild swelling over right cheek, TTP Neck- Supple, + ant cervical LAD CVS- RRR, no murmur RESP-CTAB ABD-NABS,soft,NT,ND EXT- No edema Pulses- Radial, DP2+   Assessment:    Annual wellness medicare exam   Plan:    During the course of the visit the patient was educated and counseled about appropriate screening and preventive services including:  Discussed to secondary to very poor dentition.  Given Rocephin 1 g here in the office she is able to tolerate that.  We will also send in clindamycin 300 mg 3 times a day.  She is to use salt water gargles.  We will see if we can get her set up with an oral surgeon she needs to have the teeth removed from the gumline.  Aortic atherosclerosis on statin drug.  But still has high risk factors including tobacco use hyperlipidemia diabetes mellitus obesity  DM-not checking her blood sugar states that she is taking her medication discussed the importance of checking her blood sugars following proper diet.  She is on statin drug and ACE inhibitor.  Recheck her fasting labs today.  New meter sent to pharmacy  Colon cancer screening given information for Cologuard Immunizations tetanus was sent to pharmacy she cannot afford shingles vaccine  History of metastatic melanoma- Followed by onolocy also Polycythemia , new referral placed, check CBC, has missed appointments  Chronic pain-ongoing issue for her.  She is fibromyalgia peripheral neuropathy pain from her degenerative disc disease.  She has a chronic depression pain cycle as well which makes it difficult to treat her.  I do not feel comfortable increasing her pain medication setting her other comorbidities she is already on 3 a day 7.5 Percocet this was refused.  She is also on Cymbalta and gabapentin.  We will give her Zanaflex to be used twice a day as needed.  Refer her to  a pain clinic.   Failed hearing screen significantly however she cannot afford to go to have this evaluated wants to hold off at this time   Depression long-standing history but she feels like it is mostly her pain she does not actually feel depressed and sad all the time like she did before.  Feels like her Cymbalta is working just fine.  CAGE-- NEGATIVE  Tobacco use she continues to smoke a pack a day not ready to quit        Diet review for nutrition referral? Yes ____ Not Indicated __x__  Patient Instructions (the written plan) was given to the patient.  Medicare Attestation  I have personally reviewed:  The patient's medical and social history  Their use of alcohol, tobacco or illicit drugs  Their  current medications and supplements  The patient's functional ability including ADLs,fall risks, home safety risks, cognitive, and hearing and visual impairment  Diet and physical activities  Evidence for depression or mood disorders  The patient's weight, height, BMI, and visual acuity have been recorded in the chart. I have made referrals, counseling, and provided education to the patient based on review of the above and I have provided the patient with a written personalized care plan for preventive services.

## 2018-02-21 NOTE — Telephone Encounter (Signed)
-----   Message from [place] sent at 02/21/2018 10:33 AM EDT ----- Mrs aman wanted to know if you could go ahead and send over her percocet rx to her pharmacy she forgot to ask

## 2018-02-22 LAB — CBC WITH DIFFERENTIAL/PLATELET
Basophils Absolute: 326 cells/uL — ABNORMAL HIGH (ref 0–200)
Basophils Relative: 1.1 %
Eosinophils Absolute: 740 cells/uL — ABNORMAL HIGH (ref 15–500)
Eosinophils Relative: 2.5 %
HCT: 54.7 % — ABNORMAL HIGH (ref 35.0–45.0)
Hemoglobin: 15.6 g/dL — ABNORMAL HIGH (ref 11.7–15.5)
Lymphs Abs: 1983 cells/uL (ref 850–3900)
MCH: 18.8 pg — ABNORMAL LOW (ref 27.0–33.0)
MCHC: 28.5 g/dL — AB (ref 32.0–36.0)
MCV: 66 fL — AB (ref 80.0–100.0)
MONOS PCT: 3.5 %
MPV: 9.4 fL (ref 7.5–12.5)
NEUTROS PCT: 86.2 %
Neutro Abs: 25515 cells/uL — ABNORMAL HIGH (ref 1500–7800)
PLATELETS: 698 10*3/uL — AB (ref 140–400)
RBC: 8.29 10*6/uL — AB (ref 3.80–5.10)
RDW: 21.6 % — AB (ref 11.0–15.0)
TOTAL LYMPHOCYTE: 6.7 %
WBC mixed population: 1036 cells/uL — ABNORMAL HIGH (ref 200–950)
WBC: 29.6 10*3/uL — AB (ref 3.8–10.8)

## 2018-02-22 LAB — COMPREHENSIVE METABOLIC PANEL
AG Ratio: 1.3 (calc) (ref 1.0–2.5)
ALBUMIN MSPROF: 4 g/dL (ref 3.6–5.1)
ALKALINE PHOSPHATASE (APISO): 148 U/L — AB (ref 33–130)
ALT: 10 U/L (ref 6–29)
AST: 12 U/L (ref 10–35)
BUN: 14 mg/dL (ref 7–25)
CO2: 28 mmol/L (ref 20–32)
CREATININE: 0.77 mg/dL (ref 0.50–1.05)
Calcium: 9.6 mg/dL (ref 8.6–10.4)
Chloride: 102 mmol/L (ref 98–110)
Globulin: 3 g/dL (calc) (ref 1.9–3.7)
Glucose, Bld: 135 mg/dL — ABNORMAL HIGH (ref 65–99)
POTASSIUM: 5.2 mmol/L (ref 3.5–5.3)
Sodium: 141 mmol/L (ref 135–146)
TOTAL PROTEIN: 7 g/dL (ref 6.1–8.1)
Total Bilirubin: 0.4 mg/dL (ref 0.2–1.2)

## 2018-02-22 LAB — LIPID PANEL
CHOLESTEROL: 162 mg/dL (ref <200)
HDL: 31 mg/dL — AB (ref >50)
LDL Cholesterol (Calc): 93 mg/dL (calc)
Non-HDL Cholesterol (Calc): 131 mg/dL (calc) — ABNORMAL HIGH (ref <130)
Total CHOL/HDL Ratio: 5.2 (calc) — ABNORMAL HIGH (ref <5.0)
Triglycerides: 267 mg/dL — ABNORMAL HIGH (ref <150)

## 2018-02-22 LAB — HEMOGLOBIN A1C
EAG (MMOL/L): 7.6 (calc)
Hgb A1c MFr Bld: 6.4 % of total Hgb — ABNORMAL HIGH (ref <5.7)
MEAN PLASMA GLUCOSE: 137 (calc)

## 2018-02-22 LAB — CBC MORPHOLOGY

## 2018-02-23 ENCOUNTER — Encounter: Payer: Self-pay | Admitting: *Deleted

## 2018-02-25 ENCOUNTER — Ambulatory Visit (HOSPITAL_COMMUNITY): Payer: Self-pay | Admitting: Internal Medicine

## 2018-02-25 ENCOUNTER — Other Ambulatory Visit: Payer: Self-pay | Admitting: Family Medicine

## 2018-02-27 DIAGNOSIS — J9611 Chronic respiratory failure with hypoxia: Secondary | ICD-10-CM | POA: Diagnosis not present

## 2018-02-27 DIAGNOSIS — C3490 Malignant neoplasm of unspecified part of unspecified bronchus or lung: Secondary | ICD-10-CM | POA: Diagnosis not present

## 2018-03-04 ENCOUNTER — Other Ambulatory Visit: Payer: Self-pay | Admitting: *Deleted

## 2018-03-04 MED ORDER — FLUCONAZOLE 150 MG PO TABS: 150.0000 mg | ORAL_TABLET | Freq: Once | ORAL | 0 refills | Status: AC

## 2018-03-07 DIAGNOSIS — H52209 Unspecified astigmatism, unspecified eye: Secondary | ICD-10-CM | POA: Diagnosis not present

## 2018-03-07 DIAGNOSIS — H5203 Hypermetropia, bilateral: Secondary | ICD-10-CM | POA: Diagnosis not present

## 2018-03-07 DIAGNOSIS — H524 Presbyopia: Secondary | ICD-10-CM | POA: Diagnosis not present

## 2018-03-10 ENCOUNTER — Other Ambulatory Visit: Payer: Self-pay

## 2018-03-10 ENCOUNTER — Ambulatory Visit (INDEPENDENT_AMBULATORY_CARE_PROVIDER_SITE_OTHER): Payer: Medicare HMO | Admitting: Family Medicine

## 2018-03-10 ENCOUNTER — Encounter: Payer: Self-pay | Admitting: Family Medicine

## 2018-03-10 VITALS — BP 122/64 | HR 66 | Temp 97.8°F | Resp 16 | Ht 68.0 in | Wt 229.0 lb

## 2018-03-10 DIAGNOSIS — K122 Cellulitis and abscess of mouth: Secondary | ICD-10-CM

## 2018-03-10 DIAGNOSIS — N760 Acute vaginitis: Secondary | ICD-10-CM | POA: Diagnosis not present

## 2018-03-10 DIAGNOSIS — D45 Polycythemia vera: Secondary | ICD-10-CM

## 2018-03-10 DIAGNOSIS — R300 Dysuria: Secondary | ICD-10-CM

## 2018-03-10 DIAGNOSIS — Z0189 Encounter for other specified special examinations: Secondary | ICD-10-CM | POA: Diagnosis not present

## 2018-03-10 DIAGNOSIS — R5381 Other malaise: Secondary | ICD-10-CM

## 2018-03-10 DIAGNOSIS — R5383 Other fatigue: Secondary | ICD-10-CM | POA: Diagnosis not present

## 2018-03-10 LAB — URINALYSIS, ROUTINE W REFLEX MICROSCOPIC
BACTERIA UA: NONE SEEN /HPF
Bilirubin Urine: NEGATIVE
KETONES UR: NEGATIVE
LEUKOCYTES UA: NEGATIVE
NITRITE: NEGATIVE
PROTEIN: NEGATIVE
Specific Gravity, Urine: 1.015 (ref 1.001–1.03)
WBC, UA: NONE SEEN /HPF (ref 0–5)
pH: 5.5 (ref 5.0–8.0)

## 2018-03-10 LAB — MICROSCOPIC MESSAGE

## 2018-03-10 LAB — WET PREP FOR TRICH, YEAST, CLUE

## 2018-03-10 MED ORDER — VALACYCLOVIR HCL 1 G PO TABS: 1000.0000 mg | ORAL_TABLET | Freq: Two times a day (BID) | ORAL | 0 refills | Status: DC

## 2018-03-10 NOTE — Progress Notes (Signed)
Patient ID: Katelyn Cline, female    DOB: 03-01-1961, 57 y.o.   MRN: [id number]  PCP: [name], MD  Chief Complaint  Patient presents with  . Vaginitis    was given ABTx for oral infection- noted increased itching in vaginal area-called out Diflucan x1- no relief    Subjective:   Katelyn Cline is a 57 y.o. female, presents to clinic with CC of over a week of vaginitis and dysuria with severe pain and associated vaginal swelling with sores with bleeding after wiping.  Also has raw red skin in skin folds.  Sx started while taking clindamycin for severe dental infection.  She initially tried OTC monistat w/o relief.  Her PCP rx'd diflucan and sx did not improve.  She was asked to come for OV before being given any more meds.  She has not been sexually active for decades.  She denies any hx of STDs.  She had genital sore once in her life, probably 30 years ago after applying topical steroids, and otherwise never had genital sore, lesions, rashes, never had oral sores/canker sores/cold sores in her life. Urination is painful with contact of urine on skin. Also some mild dysuria described as burning.  No urinary frequency/urgency, but she is concerned about UTI because of her DM meds. She denies N, V, D, abd pain.  She has chronic back pain that is unchanged.  Pt has complex medical hx, including polycythemia vera, chronically elevated WBC she states from always having infections, DM, fibromyalgia, chronic pain/back pain, chronic respiratory failure, malignant melanoma, multiple other psych dx, oral/dental infections and abscesses.  She denies any change to urinary frequency, back pain.  She endorses feeling intermittently swimmy headed.  She continues to endorse unchanged dental pain, is still trying to get in with a oral surgeon to treat, multiple doses of medication and antibiotics have not improved that much.  She does see oncology/hematology for polycythemia and therapeutic  phlebotomy, but has not seen a MD in a while and believes she will need a referral to f/up with them.  She denies any chest pain, shortness of breath.  No abdominal pain, suprapubic tenderness, no change urinary frequency urgency, no hematuria.  She just generally feels bad and ill with subjective fever/hot flashes, sweats.  No night sweats, no weight changes.  Pt complains of generalized malaise and tiredness.  Says she has felt "swimmy headed" but no other sx.  No syncope, visual disturbances, numbness, focal weakness, slurred speech, HA, confusion.     Patient Active Problem List   Diagnosis Date Noted  . Aortic atherosclerosis ([place]) 02/21/2018  . Constipation 05/28/2015  . OE (otitis externa) 01/04/2015  . Seborrheic keratoses 08/24/2014  . PVC (premature ventricular contraction) 05/18/2014  . Gastroenteritis 02/15/2014  . Diabetes mellitus type II, controlled ([place]) 02/06/2013  . Chronic hypoxemic respiratory failure ([place]) 02/06/2013  . History of basal cell carcinoma 12/01/2012  . Posttraumatic stress disorder 12/01/2012  . History of malignant melanoma 10/18/2012  . Polycythemia vera ([place]) 08/05/2012  . Hyperlipidemia 04/09/2012  . Narcolepsy 04/09/2012  . Peripheral neuropathy 04/09/2012  . Tobacco use disorder 04/09/2012  . Obesity 04/09/2012  . Essential hypertension, benign 04/09/2012  . DDD (degenerative disc disease), lumbar 02/16/2012  . DDD (degenerative disc disease), cervical 02/16/2012  . Chronic pain disorder 02/16/2012  . Depression 02/16/2012  . Fibromyalgia 02/16/2012     Prior to Admission medications   Medication Sig Start Date End Date Taking? Authorizing Provider  amLODipine (  NORVASC) 5 MG tablet TAKE 1 TABLET BY MOUTH EVERY DAY 01/07/18  Yes [place], Modena Nunnery, MD  atorvastatin (LIPITOR) 80 MG tablet Take 1 tablet (80 mg total) by mouth daily at 6 PM. 10/19/17  Yes [place], Modena Nunnery, MD  Blood Glucose Monitoring Suppl (BLOOD GLUCOSE SYSTEM PAK) KIT Use as  directed to monitor FSBS up to 3x daily for fluctuating FSBS. Dx: E11.65 02/21/18  Yes [place], Modena Nunnery, MD  Cholecalciferol (VITAMIN D) 2000 UNITS CAPS Take 1 capsule by mouth daily. Reported on 09/17/2015   Yes [provider]  clindamycin (CLEOCIN) 300 MG capsule Take 1 capsule (300 mg total) by mouth 3 (three) times daily. 02/21/18  Yes [place], Modena Nunnery, MD  diphenhydrAMINE (BENADRYL) 50 MG tablet Take 1 tablet 1 hour hour prior to study 09/06/17  Yes Holley Bouche, NP  DULoxetine (CYMBALTA) 60 MG capsule TAKE 2 CAPSULES BY MOUTH EVERY DAY 02/25/18  Yes [place], Modena Nunnery, MD  empagliflozin (JARDIANCE) 25 MG TABS tablet Take 25 mg by mouth daily. 10/19/17  Yes [place], Modena Nunnery, MD  fenofibrate (TRICOR) 48 MG tablet TAKE 1 TABLET(48 MG) BY MOUTH DAILY 01/07/18  Yes [place], Modena Nunnery, MD  fish oil-omega-3 fatty acids 1000 MG capsule Take 2 g by mouth 2 (two) times daily.   Yes [provider]  gabapentin (NEURONTIN) 100 MG capsule TAKE 1 TO 3 CAPSULES(100 TO 300 MG) BY MOUTH THREE TIMES DAILY AS NEEDED FOR NERVE PAIN 02/25/18  Yes [place], Modena Nunnery, MD  Glucose Blood (BLOOD GLUCOSE TEST STRIPS) STRP Please dispense based on insurance and patient preference. Use as directed to monitor FSBS 1x daily. Dx: E11.9. 10/12/17  Yes [place], Modena Nunnery, MD  ibuprofen (ADVIL,MOTRIN) 800 MG tablet TAKE 1 TABLET BY MOUTH EVERY 8 HOURS AS NEEDED 02/25/18  Yes [place], Modena Nunnery, MD  lisinopril (PRINIVIL,ZESTRIL) 5 MG tablet TAKE 1 TABLET BY MOUTH EVERY DAY 11/03/17  Yes [place], Modena Nunnery, MD  lisinopril (PRINIVIL,ZESTRIL) 5 MG tablet TAKE 1 TABLET BY MOUTH DAILY 01/07/18  Yes [place], Modena Nunnery, MD  metFORMIN (GLUCOPHAGE) 1000 MG tablet TAKE 1 TABLET BY MOUTH TWICE DAILY WITH A MEAL 12/08/17  Yes [place], Modena Nunnery, MD  MICROLET LANCETS MISC Please dispense based on patient and insurance preference. Use as directed to monitor  FSBS 1x daily. Dx: E11.9. 10/12/17  Yes [place], Modena Nunnery, MD    oxyCODONE-acetaminophen (PERCOCET) 7.5-325 MG tablet Take 1 tablet by mouth 3 (three) times daily as needed for severe pain. 02/21/18  Yes [name], Modena Nunnery, MD  tiZANidine (ZANAFLEX) 4 MG tablet Take 1 tablet (4 mg total) by mouth 2 (two) times daily as needed for muscle spasms. 02/21/18  Yes [place], Modena Nunnery, MD  triamterene-hydrochlorothiazide (DYAZIDE) 37.5-25 MG capsule TAKE 1 CAPSULE BY MOUTH EVERY MORNING 12/24/17  Yes [place], Modena Nunnery, MD     Allergies  Allergen Reactions  . Contrast Media [Iodinated Diagnostic Agents] Anaphylaxis  . Erythromycin   . Flagyl [Metronidazole] Other (See Comments)    Generalized pain.  . Latex Other (See Comments)    Was told to be careful because of Dye allergy  . Other     Iodine Dye  . Penicillins Other (See Comments)    Patient was an infant, no idea of reaction. Tolerates Keflex.  . Tetracyclines & Related Other (See Comments)    GI side effects     Family History  Problem Relation Age of Onset  . Arthritis Mother   . Hyperlipidemia Mother   .  Depression Mother   . Anxiety disorder Mother   . Dementia Mother   . Hypertension Father   . Hyperlipidemia Father   . Heart disease Father   . Stroke Father   . Dementia Father   . Heart disease Brother   . ADD / ADHD Son   . Alcohol abuse Maternal Grandfather   . Bipolar disorder Neg Hx   . Drug abuse Neg Hx   . OCD Neg Hx   . Paranoid behavior Neg Hx   . Schizophrenia Neg Hx   . Seizures Neg Hx   . Sexual abuse Neg Hx   . Physical abuse Neg Hx      Social History   Socioeconomic History  . Marital status: Divorced    Spouse name: Not on file  . Number of children: Not on file  . Years of education: 12+  . Highest education level: Not on file  Occupational History  . Not on file  Social Needs  . Financial resource strain: Not on file  . Food insecurity:    Worry: Not on file    Inability: Not on file  . Transportation needs:    Medical: Not on file    Non-medical:  Not on file  Tobacco Use  . Smoking status: Current Every Day Smoker    Packs/day: 1.00    Years: 10.00    Pack years: 10.00    Types: Cigarettes    Last attempt to quit: 09/16/2012    Years since quitting: 5.4  . Smokeless tobacco: Never Used  Substance and Sexual Activity  . Alcohol use: Yes    Comment: 1 -2 drinks a month, occasional  . Drug use: No  . Sexual activity: Not Currently  Lifestyle  . Physical activity:    Days per week: Not on file    Minutes per session: Not on file  . Stress: Not on file  Relationships  . Social connections:    Talks on phone: Not on file    Gets together: Not on file    Attends religious service: Not on file    Active member of club or organization: Not on file    Attends meetings of clubs or organizations: Not on file    Relationship status: Not on file  . Intimate partner violence:    Fear of current or ex partner: Not on file    Emotionally abused: Not on file    Physically abused: Not on file    Forced sexual activity: Not on file  Other Topics Concern  . Not on file  Social History Narrative  . Not on file     Review of Systems  Constitutional: Positive for fatigue. Negative for appetite change and chills.  HENT: Positive for dental problem. Negative for congestion, drooling, ear discharge, ear pain, facial swelling, hearing loss, mouth sores, rhinorrhea, sinus pressure, sinus pain, sore throat and trouble swallowing.   Eyes: Negative.   Respiratory: Negative.   Cardiovascular: Negative.   Gastrointestinal: Negative.   Endocrine: Negative.   Genitourinary: Positive for dysuria, frequency (unchanged), genital sores, urgency (unchanged from baseline) and vaginal pain. Negative for decreased urine volume, difficulty urinating, enuresis, flank pain, hematuria, menstrual problem and pelvic pain.  Musculoskeletal: Positive for back pain and myalgias (chronic). Negative for joint swelling.  Skin: Negative.   Allergic/Immunologic:  Negative.   Neurological: Negative.  Negative for tremors, seizures, syncope, facial asymmetry, weakness, light-headedness, numbness and headaches.  Hematological: Negative.   Psychiatric/Behavioral: Negative.  10 Systems reviewed and are negative for acute change except as noted in the HPI.      Objective:    Vitals:   03/10/18 1100  BP: 122/64  Pulse: 66  Resp: 16  Temp: 97.8 F (36.6 C)  TempSrc: Oral  SpO2: 92%  Weight: 229 lb (103.9 kg)  Height: _0  (1.727 m)      Physical Exam  Constitutional: She is oriented to person, place, and time. She appears well-developed and well-nourished. No distress.  Chronically ill and obese appearing female, NAD, non-toxic appearing, alert  HENT:  Head: Normocephalic and atraumatic.  Right Ear: External ear normal.  Left Ear: External ear normal.  Nose: Nose normal.  Mouth/Throat: Uvula is midline and oropharynx is clear and moist. Mucous membranes are pale, not dry and not cyanotic. No trismus in the jaw. Abnormal dentition. Dental caries present. No uvula swelling. No oropharyngeal exudate, posterior oropharyngeal edema, posterior oropharyngeal erythema or tonsillar abscesses. No tonsillar exudate.  No subligual ttp, uvula midline, no stridor, no trismus, airway intact  Eyes: Pupils are equal, round, and reactive to light. Conjunctivae and EOM are normal. Right eye exhibits no discharge. Left eye exhibits no discharge. No scleral icterus.  Neck: Trachea normal, normal range of motion, full passive range of motion without pain and phonation normal. Neck supple. No tracheal tenderness, no spinous process tenderness and no muscular tenderness present. No neck rigidity. No tracheal deviation, no edema, no erythema and normal range of motion present.  Cardiovascular: Normal rate, regular rhythm, normal heart sounds and intact distal pulses.  Pulmonary/Chest: Effort normal and breath sounds normal. No respiratory distress.  Abdominal: Soft.  Bowel sounds are normal. She exhibits no distension and no mass. There is no tenderness. There is no rebound and no guarding.  No CVA tenderness  Genitourinary: Vagina normal. No labial fusion. There is tenderness and lesion on the right labia. There is no rash or injury on the right labia. There is no rash or injury on the left labia. No tenderness or bleeding in the vagina. No vaginal discharge found.  Genitourinary Comments: Right labia minora with multiple lesions - ulcers, ttp, viral swab obtained Vaginal wet prep swab obtained B/l labia majora swollen without erythema, no rash  Musculoskeletal: Normal range of motion. She exhibits no deformity.  Lymphadenopathy:       Head (right side): Submandibular and tonsillar adenopathy present. No submental, no preauricular, no posterior auricular and no occipital adenopathy present.       Head (left side): Submandibular and tonsillar adenopathy present. No submental, no preauricular, no posterior auricular and no occipital adenopathy present.    She has no cervical adenopathy.  Neurological: She is alert and oriented to person, place, and time. She exhibits normal muscle tone. Coordination normal.  Skin: Skin is warm and dry. Capillary refill takes less than 2 seconds. No rash noted. She is not diaphoretic. No erythema. No pallor.  Nursing note and vitals reviewed.     Results for orders placed or performed in visit on 03/10/18  WET PREP FOR [place], YEAST, CLUE  Result Value Ref Range   Source: GENITAL    RESULT         Assessment & Plan:      ICD-10-CM   1. Acute vaginitis N76.0 Herpes simplex virus culture    WET PREP FOR [place], YEAST, CLUE    CANCELED: WET PREP FOR [place], YEAST, CLUE  2. Dysuria R30.0 Urine Culture    Urinalysis, Routine w reflex  microscopic    CANCELED: Urinalysis, Routine w reflex microscopic  3. Malaise and fatigue R53.81 CBC with Differential   W01.80 COMPLETE METABOLIC PANEL WITH GFR  4. Polycythemia vera  ([name]) D45 CBC with Differential    COMPLETE METABOLIC PANEL WITH GFR  5. Abscess of mouth K12.2 CBC with Differential    COMPLETE METABOLIC PANEL WITH GFR    Wet prep negative, will start tx for possible genital herpes, clinical dx based off lesion appearance and tenderness. UA with 2+ glucose, med SE, and otherwise unremarkable, will not tx UTI, but with meds and SE of UTI Pending urine culture   Pt has longstanding dental infection, she has continued pain after abx tx.  No concern currently for ludwigs angina, but did discuss signs and sx and indication to go to the ER or call 911  She complains of generalized malaise and fatigue, nothing else focal, but the assessment is difficult with multiple chronic problems, chronic pain, psychiatric illness, chronic infections, her recent labs reviewed and she has this gradually worsening leukocytosis which is concerning, and pt is new to me, unsure if it is secondary to infection or other disease process?  Encouraged her to go to oncology/hematology because she should already be established there.  She said she hasn't see a MD and they have a new MD - plan was for her to call and see if she can go in or if she needs new referral.   Because of her vague complaints and abnormal lab hx, last labs in April, will obtain basic labs.  Delsa Grana, PA-C 03/10/18 11:33 AM

## 2018-03-10 NOTE — Patient Instructions (Signed)
Will start to treat the vaginal lesion with valtrex.  Testing pending for UTI and herpes.   Blood work recheck since you have so much medical history and lab abnormalities.  Please call your oncologist/hematologist and get in with them as soon as possible.    Dental infections - go to the ER if you have swelling or redness track down the front of your neck, have trouble opening or closing your mouth, difficulty breathing or swallowing, or with fever/nausea/vomiting causing your to be unable to take meds, or any confusion or near passing out episodes.

## 2018-03-11 ENCOUNTER — Emergency Department (HOSPITAL_COMMUNITY): Payer: Medicare HMO

## 2018-03-11 ENCOUNTER — Other Ambulatory Visit: Payer: Self-pay

## 2018-03-11 ENCOUNTER — Encounter (HOSPITAL_COMMUNITY): Payer: Self-pay

## 2018-03-11 ENCOUNTER — Inpatient Hospital Stay (HOSPITAL_COMMUNITY)
Admission: EM | Admit: 2018-03-11 | Discharge: 2018-03-16 | DRG: 841 | Disposition: A | Discharge status: Home / Self Care | Payer: Medicare HMO | Attending: Internal Medicine | Admitting: Internal Medicine

## 2018-03-11 DIAGNOSIS — D72829 Elevated white blood cell count, unspecified: Secondary | ICD-10-CM | POA: Diagnosis present

## 2018-03-11 DIAGNOSIS — Z823 Family history of stroke: Secondary | ICD-10-CM

## 2018-03-11 DIAGNOSIS — E1140 Type 2 diabetes mellitus with diabetic neuropathy, unspecified: Secondary | ICD-10-CM | POA: Diagnosis present

## 2018-03-11 DIAGNOSIS — R160 Hepatomegaly, not elsewhere classified: Secondary | ICD-10-CM | POA: Diagnosis not present

## 2018-03-11 DIAGNOSIS — A419 Sepsis, unspecified organism: Secondary | ICD-10-CM | POA: Diagnosis present

## 2018-03-11 DIAGNOSIS — R55 Syncope and collapse: Secondary | ICD-10-CM | POA: Diagnosis not present

## 2018-03-11 DIAGNOSIS — Z79899 Other long term (current) drug therapy: Secondary | ICD-10-CM

## 2018-03-11 DIAGNOSIS — Z880 Allergy status to penicillin: Secondary | ICD-10-CM | POA: Diagnosis not present

## 2018-03-11 DIAGNOSIS — E785 Hyperlipidemia, unspecified: Secondary | ICD-10-CM | POA: Diagnosis present

## 2018-03-11 DIAGNOSIS — Z716 Tobacco abuse counseling: Secondary | ICD-10-CM | POA: Diagnosis not present

## 2018-03-11 DIAGNOSIS — Z91040 Latex allergy status: Secondary | ICD-10-CM

## 2018-03-11 DIAGNOSIS — Z888 Allergy status to other drugs, medicaments and biological substances status: Secondary | ICD-10-CM

## 2018-03-11 DIAGNOSIS — G4730 Sleep apnea, unspecified: Secondary | ICD-10-CM | POA: Diagnosis present

## 2018-03-11 DIAGNOSIS — F17200 Nicotine dependence, unspecified, uncomplicated: Secondary | ICD-10-CM | POA: Diagnosis not present

## 2018-03-11 DIAGNOSIS — K5730 Diverticulosis of large intestine without perforation or abscess without bleeding: Secondary | ICD-10-CM | POA: Diagnosis not present

## 2018-03-11 DIAGNOSIS — M797 Fibromyalgia: Secondary | ICD-10-CM | POA: Diagnosis present

## 2018-03-11 DIAGNOSIS — R221 Localized swelling, mass and lump, neck: Secondary | ICD-10-CM | POA: Diagnosis not present

## 2018-03-11 DIAGNOSIS — G894 Chronic pain syndrome: Secondary | ICD-10-CM | POA: Diagnosis present

## 2018-03-11 DIAGNOSIS — E875 Hyperkalemia: Secondary | ICD-10-CM | POA: Diagnosis not present

## 2018-03-11 DIAGNOSIS — Z881 Allergy status to other antibiotic agents status: Secondary | ICD-10-CM

## 2018-03-11 DIAGNOSIS — N179 Acute kidney failure, unspecified: Secondary | ICD-10-CM | POA: Diagnosis present

## 2018-03-11 DIAGNOSIS — I10 Essential (primary) hypertension: Secondary | ICD-10-CM | POA: Diagnosis present

## 2018-03-11 DIAGNOSIS — E119 Type 2 diabetes mellitus without complications: Secondary | ICD-10-CM

## 2018-03-11 DIAGNOSIS — M419 Scoliosis, unspecified: Secondary | ICD-10-CM | POA: Diagnosis present

## 2018-03-11 DIAGNOSIS — Z79891 Long term (current) use of opiate analgesic: Secondary | ICD-10-CM

## 2018-03-11 DIAGNOSIS — K029 Dental caries, unspecified: Secondary | ICD-10-CM | POA: Diagnosis present

## 2018-03-11 DIAGNOSIS — Z85118 Personal history of other malignant neoplasm of bronchus and lung: Secondary | ICD-10-CM | POA: Diagnosis not present

## 2018-03-11 DIAGNOSIS — B009 Herpesviral infection, unspecified: Secondary | ICD-10-CM | POA: Diagnosis not present

## 2018-03-11 DIAGNOSIS — Z90710 Acquired absence of both cervix and uterus: Secondary | ICD-10-CM | POA: Diagnosis not present

## 2018-03-11 DIAGNOSIS — Z8349 Family history of other endocrine, nutritional and metabolic diseases: Secondary | ICD-10-CM

## 2018-03-11 DIAGNOSIS — Z8261 Family history of arthritis: Secondary | ICD-10-CM

## 2018-03-11 DIAGNOSIS — M5136 Other intervertebral disc degeneration, lumbar region: Secondary | ICD-10-CM | POA: Diagnosis present

## 2018-03-11 DIAGNOSIS — D72823 Leukemoid reaction: Secondary | ICD-10-CM | POA: Diagnosis present

## 2018-03-11 DIAGNOSIS — Z818 Family history of other mental and behavioral disorders: Secondary | ICD-10-CM

## 2018-03-11 DIAGNOSIS — I700 Atherosclerosis of aorta: Secondary | ICD-10-CM | POA: Diagnosis present

## 2018-03-11 DIAGNOSIS — G47419 Narcolepsy without cataplexy: Secondary | ICD-10-CM | POA: Diagnosis present

## 2018-03-11 DIAGNOSIS — Z791 Long term (current) use of non-steroidal anti-inflammatories (NSAID): Secondary | ICD-10-CM

## 2018-03-11 DIAGNOSIS — D469 Myelodysplastic syndrome, unspecified: Secondary | ICD-10-CM | POA: Diagnosis not present

## 2018-03-11 DIAGNOSIS — D45 Polycythemia vera: Secondary | ICD-10-CM | POA: Diagnosis not present

## 2018-03-11 DIAGNOSIS — Z6834 Body mass index (BMI) 34.0-34.9, adult: Secondary | ICD-10-CM

## 2018-03-11 DIAGNOSIS — Z91041 Radiographic dye allergy status: Secondary | ICD-10-CM

## 2018-03-11 DIAGNOSIS — F410 Panic disorder [episodic paroxysmal anxiety] without agoraphobia: Secondary | ICD-10-CM | POA: Diagnosis present

## 2018-03-11 DIAGNOSIS — J449 Chronic obstructive pulmonary disease, unspecified: Secondary | ICD-10-CM | POA: Diagnosis present

## 2018-03-11 DIAGNOSIS — Z87440 Personal history of urinary (tract) infections: Secondary | ICD-10-CM

## 2018-03-11 DIAGNOSIS — J9611 Chronic respiratory failure with hypoxia: Secondary | ICD-10-CM | POA: Diagnosis present

## 2018-03-11 DIAGNOSIS — D473 Essential (hemorrhagic) thrombocythemia: Secondary | ICD-10-CM | POA: Diagnosis not present

## 2018-03-11 DIAGNOSIS — F17210 Nicotine dependence, cigarettes, uncomplicated: Secondary | ICD-10-CM | POA: Diagnosis not present

## 2018-03-11 DIAGNOSIS — D72828 Other elevated white blood cell count: Secondary | ICD-10-CM

## 2018-03-11 DIAGNOSIS — E860 Dehydration: Secondary | ICD-10-CM | POA: Diagnosis present

## 2018-03-11 DIAGNOSIS — R0989 Other specified symptoms and signs involving the circulatory and respiratory systems: Secondary | ICD-10-CM | POA: Diagnosis not present

## 2018-03-11 DIAGNOSIS — Z85820 Personal history of malignant melanoma of skin: Secondary | ICD-10-CM

## 2018-03-11 DIAGNOSIS — R918 Other nonspecific abnormal finding of lung field: Secondary | ICD-10-CM | POA: Diagnosis not present

## 2018-03-11 DIAGNOSIS — M5090 Cervical disc disorder, unspecified, unspecified cervical region: Secondary | ICD-10-CM | POA: Diagnosis present

## 2018-03-11 DIAGNOSIS — Z8249 Family history of ischemic heart disease and other diseases of the circulatory system: Secondary | ICD-10-CM

## 2018-03-11 DIAGNOSIS — Z7984 Long term (current) use of oral hypoglycemic drugs: Secondary | ICD-10-CM

## 2018-03-11 DIAGNOSIS — E669 Obesity, unspecified: Secondary | ICD-10-CM | POA: Diagnosis present

## 2018-03-11 DIAGNOSIS — G8929 Other chronic pain: Secondary | ICD-10-CM | POA: Diagnosis present

## 2018-03-11 DIAGNOSIS — F319 Bipolar disorder, unspecified: Secondary | ICD-10-CM | POA: Diagnosis present

## 2018-03-11 DIAGNOSIS — D7589 Other specified diseases of blood and blood-forming organs: Secondary | ICD-10-CM | POA: Diagnosis not present

## 2018-03-11 DIAGNOSIS — D471 Chronic myeloproliferative disease: Secondary | ICD-10-CM | POA: Diagnosis not present

## 2018-03-11 DIAGNOSIS — Z9981 Dependence on supplemental oxygen: Secondary | ICD-10-CM

## 2018-03-11 DIAGNOSIS — R531 Weakness: Secondary | ICD-10-CM | POA: Diagnosis not present

## 2018-03-11 DIAGNOSIS — F4000 Agoraphobia, unspecified: Secondary | ICD-10-CM | POA: Diagnosis present

## 2018-03-11 DIAGNOSIS — D751 Secondary polycythemia: Secondary | ICD-10-CM | POA: Diagnosis present

## 2018-03-11 LAB — CBC WITH DIFFERENTIAL/PLATELET
BASOS PCT: 1.1 %
Basophils Absolute: 352 cells/uL — ABNORMAL HIGH (ref 0–200)
EOS ABS: 928 {cells}/uL — AB (ref 15–500)
Eosinophils Relative: 2.9 %
HEMATOCRIT: 51.8 % — AB (ref 35.0–45.0)
Hemoglobin: 15.1 g/dL (ref 11.7–15.5)
Lymphs Abs: 3040 cells/uL (ref 850–3900)
MCH: 18.9 pg — ABNORMAL LOW (ref 27.0–33.0)
MCHC: 29.2 g/dL — ABNORMAL LOW (ref 32.0–36.0)
MCV: 65 fL — AB (ref 80.0–100.0)
MPV: 9.6 fL (ref 7.5–12.5)
Monocytes Relative: 3.1 %
NEUTROS ABS: 26688 {cells}/uL — AB (ref 1500–7800)
Neutrophils Relative %: 83.4 %
Platelets: 793 10*3/uL — ABNORMAL HIGH (ref 140–400)
RBC: 7.97 10*6/uL — ABNORMAL HIGH (ref 3.80–5.10)
RDW: 22.8 % — ABNORMAL HIGH (ref 11.0–15.0)
Total Lymphocyte: 9.5 %
WBC: 32 10*3/uL — ABNORMAL HIGH (ref 3.8–10.8)
WBCMIX: 992 {cells}/uL — AB (ref 200–950)

## 2018-03-11 LAB — CBC MORPHOLOGY

## 2018-03-11 LAB — URINALYSIS, ROUTINE W REFLEX MICROSCOPIC
BILIRUBIN URINE: NEGATIVE
Glucose, UA: >=500 mg/dL — AB
Hgb urine dipstick: NEGATIVE
KETONES UR: NEGATIVE mg/dL
NITRITE: NEGATIVE
PH: 5 (ref 5.0–8.0)
Protein, ur: NEGATIVE mg/dL
SPECIFIC GRAVITY, URINE: 1.023 (ref 1.005–1.030)

## 2018-03-11 LAB — CBC
HEMATOCRIT: 47.7 % — AB (ref 36.0–46.0)
Hemoglobin: 14.6 g/dL (ref 12.0–15.0)
MCH: 20.4 pg — ABNORMAL LOW (ref 26.0–34.0)
MCHC: 30.6 g/dL (ref 30.0–36.0)
MCV: 66.6 fL — AB (ref 78.0–100.0)
PLATELETS: 649 10*3/uL — AB (ref 150–400)
RBC: 7.16 MIL/uL — ABNORMAL HIGH (ref 3.87–5.11)
RDW: 21.8 % — AB (ref 11.5–15.5)
WBC: 32.7 10*3/uL — AB (ref 4.0–10.5)

## 2018-03-11 LAB — COMPLETE METABOLIC PANEL WITH GFR
AG RATIO: 1.4 (calc) (ref 1.0–2.5)
ALT: 14 U/L (ref 6–29)
AST: 11 U/L (ref 10–35)
Albumin: 4.3 g/dL (ref 3.6–5.1)
Alkaline phosphatase (APISO): 160 U/L — ABNORMAL HIGH (ref 33–130)
BUN/Creatinine Ratio: 51 (calc) — ABNORMAL HIGH (ref 6–22)
BUN: 43 mg/dL — ABNORMAL HIGH (ref 7–25)
CALCIUM: 9.8 mg/dL (ref 8.6–10.4)
CO2: 25 mmol/L (ref 20–32)
Chloride: 100 mmol/L (ref 98–110)
Creat: 0.85 mg/dL (ref 0.50–1.05)
GFR, EST NON AFRICAN AMERICAN: 75 mL/min/{1.73_m2} (ref >=60)
GFR, Est African American: 87 mL/min/{1.73_m2} (ref >=60)
GLOBULIN: 3 g/dL (ref 1.9–3.7)
Glucose, Bld: 118 mg/dL — ABNORMAL HIGH (ref 65–99)
POTASSIUM: 6.6 mmol/L — AB (ref 3.5–5.3)
SODIUM: 141 mmol/L (ref 135–146)
Total Bilirubin: 0.3 mg/dL (ref 0.2–1.2)
Total Protein: 7.3 g/dL (ref 6.1–8.1)

## 2018-03-11 LAB — I-STAT CG4 LACTIC ACID, ED
Lactic Acid, Venous: 2.05 mmol/L (ref 0.5–1.9)
Lactic Acid, Venous: 1.76 mmol/L (ref 0.5–1.9)

## 2018-03-11 LAB — BASIC METABOLIC PANEL
Anion gap: 10 (ref 5–15)
BUN: 32 mg/dL — AB (ref 6–20)
CHLORIDE: 105 mmol/L (ref 98–111)
CO2: 25 mmol/L (ref 22–32)
CREATININE: 0.79 mg/dL (ref 0.44–1.00)
Calcium: 9 mg/dL (ref 8.9–10.3)
GFR calc Af Amer: >60 mL/min (ref >60)
GFR calc non Af Amer: >60 mL/min (ref >60)
Glucose, Bld: 155 mg/dL — ABNORMAL HIGH (ref 70–99)
POTASSIUM: 5.5 mmol/L — AB (ref 3.5–5.1)
SODIUM: 140 mmol/L (ref 135–145)

## 2018-03-11 LAB — URINE CULTURE
MICRO NUMBER:: 91094224
SPECIMEN QUALITY:: ADEQUATE

## 2018-03-11 LAB — GLUCOSE, CAPILLARY
GLUCOSE-CAPILLARY: 132 mg/dL — AB (ref 70–99)
Glucose-Capillary: 117 mg/dL — ABNORMAL HIGH (ref 70–99)

## 2018-03-11 LAB — CBG MONITORING, ED: Glucose-Capillary: 152 mg/dL — ABNORMAL HIGH (ref 70–99)

## 2018-03-11 MED ORDER — OXYCODONE-ACETAMINOPHEN 5-325 MG PO TABS
1.0000 | ORAL_TABLET | Freq: Three times a day (TID) | ORAL | Status: DC | PRN
  Administered 2018-03-11 – 2018-03-16 (×9): 1 via ORAL
  Filled 2018-03-11 (×10): qty 1

## 2018-03-11 MED ORDER — HYDRALAZINE HCL 50 MG PO TABS
50.0000 mg | ORAL_TABLET | Freq: Three times a day (TID) | ORAL | Status: DC
  Administered 2018-03-11 – 2018-03-16 (×14): 50 mg via ORAL
  Filled 2018-03-11 (×14): qty 1

## 2018-03-11 MED ORDER — SODIUM CHLORIDE 0.9 % IV BOLUS
1000.0000 mL | Freq: Once | INTRAVENOUS | Status: AC
  Administered 2018-03-11: 1000 mL via INTRAVENOUS

## 2018-03-11 MED ORDER — OXYCODONE-ACETAMINOPHEN 7.5-325 MG PO TABS: 1.0000 | ORAL_TABLET | Freq: Three times a day (TID) | ORAL | Status: DC | PRN

## 2018-03-11 MED ORDER — VALACYCLOVIR HCL 500 MG PO TABS
1000.0000 mg | ORAL_TABLET | Freq: Two times a day (BID) | ORAL | Status: DC
  Administered 2018-03-11 – 2018-03-16 (×10): 1000 mg via ORAL
  Filled 2018-03-11 (×10): qty 2

## 2018-03-11 MED ORDER — DULOXETINE HCL 60 MG PO CPEP
120.0000 mg | ORAL_CAPSULE | Freq: Every day | ORAL | Status: DC
  Administered 2018-03-11 – 2018-03-16 (×6): 120 mg via ORAL
  Filled 2018-03-11 (×6): qty 2

## 2018-03-11 MED ORDER — SODIUM BICARBONATE 8.4 % IV SOLN
50.0000 meq | Freq: Once | INTRAVENOUS | Status: AC
  Administered 2018-03-11: 50 meq via INTRAVENOUS
  Filled 2018-03-11: qty 50

## 2018-03-11 MED ORDER — OXYCODONE HCL 5 MG PO TABS
2.5000 mg | ORAL_TABLET | Freq: Three times a day (TID) | ORAL | Status: DC | PRN
  Administered 2018-03-12 – 2018-03-16 (×4): 2.5 mg via ORAL
  Filled 2018-03-11 (×4): qty 1

## 2018-03-11 MED ORDER — AMLODIPINE BESYLATE 5 MG PO TABS
5.0000 mg | ORAL_TABLET | Freq: Every day | ORAL | Status: DC
  Administered 2018-03-11 – 2018-03-16 (×6): 5 mg via ORAL
  Filled 2018-03-11 (×6): qty 1

## 2018-03-11 MED ORDER — OXYCODONE-ACETAMINOPHEN 7.5-325 MG PO TABS: 1.0000 | ORAL_TABLET | Freq: Once | ORAL | Status: DC

## 2018-03-11 MED ORDER — GABAPENTIN 100 MG PO CAPS: 100.0000 mg | ORAL_CAPSULE | Freq: Three times a day (TID) | ORAL | Status: DC | PRN

## 2018-03-11 MED ORDER — INSULIN ASPART 100 UNIT/ML ~~LOC~~ SOLN
0.0000 [IU] | Freq: Three times a day (TID) | SUBCUTANEOUS | Status: DC
  Administered 2018-03-12: 1 [IU] via SUBCUTANEOUS
  Administered 2018-03-12: 2 [IU] via SUBCUTANEOUS

## 2018-03-11 MED ORDER — TIZANIDINE HCL 4 MG PO TABS: 4.0000 mg | ORAL_TABLET | Freq: Two times a day (BID) | ORAL | Status: DC | PRN

## 2018-03-11 MED ORDER — SODIUM CHLORIDE 0.9 % IV SOLN
2.0000 g | Freq: Once | INTRAVENOUS | Status: AC
  Administered 2018-03-11: 2 g via INTRAVENOUS
  Filled 2018-03-11: qty 2

## 2018-03-11 MED ORDER — SODIUM POLYSTYRENE SULFONATE 15 GM/60ML PO SUSP
30.0000 g | Freq: Once | ORAL | Status: AC
  Administered 2018-03-11: 30 g via ORAL
  Filled 2018-03-11: qty 120

## 2018-03-11 MED ORDER — SODIUM CHLORIDE 0.9 % IV SOLN
INTRAVENOUS | Status: DC
  Administered 2018-03-11 – 2018-03-15 (×7): via INTRAVENOUS

## 2018-03-11 MED ORDER — ENOXAPARIN SODIUM 60 MG/0.6ML ~~LOC~~ SOLN
50.0000 mg | SUBCUTANEOUS | Status: DC
  Administered 2018-03-11 – 2018-03-15 (×5): 50 mg via SUBCUTANEOUS
  Filled 2018-03-11 (×3): qty 0.6

## 2018-03-11 MED ORDER — VANCOMYCIN HCL IN DEXTROSE 1-5 GM/200ML-% IV SOLN
1000.0000 mg | Freq: Once | INTRAVENOUS | Status: AC
  Administered 2018-03-11: 1000 mg via INTRAVENOUS
  Filled 2018-03-11: qty 200

## 2018-03-11 NOTE — ED Triage Notes (Signed)
patient was called by her PCP today and was told she had elevated K+, BUN, and WBC. Patient also c/o weakness x 2 weeks. Patient states at times she feels like near syncope.

## 2018-03-11 NOTE — Progress Notes (Signed)
Pt states that she gave a urine specimen in the ED

## 2018-03-11 NOTE — ED Notes (Signed)
Report called to [name], RN    ED TO INPATIENT HANDOFF REPORT  Name/Age/Gender Katelyn Cline 57 y.o. female  Code Status Code Status History    Date Active Date Inactive Code Status Order ID Comments User Context   10/15/2013 0821 10/18/2013 2004 Full Code [id number]  Samuella Cota, MD Inpatient      Home/SNF/Other Home  Chief Complaint weakness  Level of Care/Admitting Diagnosis ED Disposition    ED Disposition Condition [facility] Area: [facility] [[id number]]  Level of Care: Med-Surg [16]  Diagnosis: Sepsis [facility]) [[id number]]  Admitting Physician: Nita Sells [phone]  Attending Physician: Nita Sells [phone]  Estimated length of stay: 3 - 4 days  Certification:: I certify this patient will need inpatient services for at least 2 midnights  PT Class (Do Not Modify): Inpatient [101]  PT Acc Code (Do Not Modify): Private [1]       Medical History Past Medical History:  Diagnosis Date  . Agoraphobia   . Basal cell carcinoma   . Chronic pain   . Chronic respiratory failure with hypoxia ([name])    3L [place]  . DM type 2 (diabetes mellitus, type 2) ([place]) 10/16/2013  . Dysrhythmia    Hx: of palpitations "a long time ago"  . Elevated hemoglobin ([place]) 2014  . Fibromyalgia   . History of UTI   . HTN (hypertension)   . Hyperlipidemia   . Major depression   . Metastatic melanoma ([place]) 10/18/2012   12 mm posterior right upper lobe pulmonary nodule, max SUV 3.0    . Migraines   . Miscarriage    x 4  . Narcolepsy   . Panic attacks   . Peripheral neuropathy   . Peripheral neuropathy    in feet  . Polycythemia secondary to smoking   . Polycythemia vera(238.4)   . Scoliosis   . Shortness of breath    Hx: of with activity  . Sleep apnea   . Vertigo     Allergies Allergies  Allergen Reactions  . Contrast Media [Iodinated Diagnostic Agents] Anaphylaxis  . Erythromycin   . Flagyl [Metronidazole] Other (See Comments)     Generalized pain.  . Latex Other (See Comments)    Was told to be careful because of Dye allergy  . Other     Iodine Dye  . Penicillins Other (See Comments)    Patient was an infant, no idea of reaction. Tolerates Keflex.  . Tetracyclines & Related Other (See Comments)    GI side effects    IV Location/Drains/Wounds Patient Lines/Drains/Airways Status   Active Line/Drains/Airways    Name:   Placement date:   Placement time:   Site:   Days:   Peripheral IV Right Arm   -    1526    Arm             Labs/Imaging Results for orders placed or performed during the hospital encounter of 03/11/18 (from the past 48 hour(s))  Basic metabolic panel     Status: Abnormal   Collection Time: 03/11/18 12:09 PM  Result Value Ref Range   Sodium 140 135 - 145 mmol/L   Potassium 5.5 (H) 3.5 - 5.1 mmol/L   Chloride 105 98 - 111 mmol/L   CO2 25 22 - 32 mmol/L   Glucose, Bld 155 (H) 70 - 99 mg/dL   BUN 32 (H) 6 - 20 mg/dL   Creatinine, Ser 0.79 0.44 - 1.00 mg/dL  Calcium 9.0 8.9 - 10.3 mg/dL   GFR calc non Af Amer >60 >60 mL/min   GFR calc Af Amer >60 >60 mL/min    Comment: (NOTE) The eGFR has been calculated using the CKD EPI equation. This calculation has not been validated in all clinical situations. eGFR's persistently <60 mL/min signify possible Chronic Kidney Disease.    Anion gap 10 5 - 15    Comment: Performed at [facility], [place] [address] [place], [place] [address]  CBC     Status: Abnormal   Collection Time: 03/11/18 12:09 PM  Result Value Ref Range   WBC 32.7 (H) 4.0 - 10.5 K/uL   RBC 7.16 (H) 3.87 - 5.11 MIL/uL   Hemoglobin 14.6 12.0 - 15.0 g/dL   HCT 47.7 (H) 36.0 - 46.0 %   MCV 66.6 (L) 78.0 - 100.0 fL   MCH 20.4 (L) 26.0 - 34.0 pg   MCHC 30.6 30.0 - 36.0 g/dL   RDW 21.8 (H) 11.5 - 15.5 %   Platelets 649 (H) 150 - 400 K/uL    Comment: Performed at [facility], [place] [address], [place], [place] [address]  CBG monitoring, ED      Status: Abnormal   Collection Time: 03/11/18 12:31 PM  Result Value Ref Range   Glucose-Capillary 152 (H) 70 - 99 mg/dL  I-Stat CG4 Lactic Acid, ED     Status: Abnormal   Collection Time: 03/11/18 12:39 PM  Result Value Ref Range   Lactic Acid, Venous 2.05 (HH) 0.5 - 1.9 mmol/L   Comment NOTIFIED PHYSICIAN   Urinalysis, Routine w reflex microscopic     Status: Abnormal   Collection Time: 03/11/18  1:45 PM  Result Value Ref Range   Color, Urine YELLOW YELLOW   APPearance CLEAR CLEAR   Specific Gravity, Urine 1.023 1.005 - 1.030   pH 5.0 5.0 - 8.0   Glucose, UA >=500 (A) NEGATIVE mg/dL   Hgb urine dipstick NEGATIVE NEGATIVE   Bilirubin Urine NEGATIVE NEGATIVE   Ketones, ur NEGATIVE NEGATIVE mg/dL   Protein, ur NEGATIVE NEGATIVE mg/dL   Nitrite NEGATIVE NEGATIVE   Leukocytes, UA TRACE (A) NEGATIVE   RBC / HPF 0-5 0 - 5 RBC/hpf   WBC, UA 6-10 0 - 5 WBC/hpf   Bacteria, UA RARE (A) NONE SEEN   Squamous Epithelial / LPF 0-5 0 - 5   Mucus PRESENT     Comment: Performed at [facility], [place] [address] [place], [place] [address]  I-Stat CG4 Lactic Acid, ED     Status: None   Collection Time: 03/11/18  3:32 PM  Result Value Ref Range   Lactic Acid, Venous 1.76 0.5 - 1.9 mmol/L   Ct Abdomen Pelvis Wo Contrast  Result Date: 03/11/2018 CLINICAL DATA:  Leukocytosis. Elevated potassium and BUN. Weakness for the past 2 weeks. Metastatic melanoma. EXAM: CT CHEST, ABDOMEN AND PELVIS WITHOUT CONTRAST TECHNIQUE: Multidetector CT imaging of the chest, abdomen and pelvis was performed following the standard protocol without IV contrast. COMPARISON:  09/21/2017. FINDINGS: CT CHEST FINDINGS Cardiovascular: Atheromatous calcifications, including the coronary arteries and aorta. Mediastinum/Nodes: No enlarged mediastinal, hilar, or axillary lymph nodes. Thyroid gland, trachea, and esophagus demonstrate no significant findings. Lungs/Pleura: 7 mm left lower lobe nodule on image number 112  series 7, 8 mm previously. 5 mm left upper lobe nodule on image number 62 series 7, previously 5 mm. 6 mm subpleural nodule in the right middle lobe along the minor fissure  on image number 76 series 7, previously 4 mm. This remains thin and plaque-like in the sagittal plane, unchanged in size in the sagittal plane. The previously demonstrated 3 mm right upper lobe nodule on the major fissure measures 5 mm on image number 64 series 7 today, also unchanged in the sagittal plane. No new nodules are seen. Musculoskeletal: Mild thoracic and lower cervical spine degenerative changes. CT ABDOMEN PELVIS FINDINGS Hepatobiliary: The liver remains mildly enlarged. No focal liver abnormality is seen. No gallstones, gallbladder wall thickening, or biliary dilatation. Pancreas: Unremarkable. No pancreatic ductal dilatation or surrounding inflammatory changes. Spleen: Normal in size without focal abnormality. Adrenals/Urinary Tract: The previously suspected small right renal cysts measures fluid density today. Otherwise, normal appearing adrenal glands, kidneys, ureters and urinary bladder. Stomach/Bowel: Multiple sigmoid colon diverticula. Mildly prominent stool. Normal appearing stomach, small bowel and appendix. Vascular/Lymphatic: Atheromatous arterial calcifications, including left renal artery branch calcifications peripherally. No enlarged lymph nodes. Reproductive: Status post hysterectomy. No adnexal masses. Other: Small umbilical hernia containing fat. Musculoskeletal: Mild lumbar spine degenerative changes. IMPRESSION: 1. No acute abnormality. 2. Stable small bilateral lung nodules, as described above. 3. Stable hepatomegaly. 4. Sigmoid diverticulosis. 5. Atheromatous arterial calcifications, including the coronary arteries and aorta. Electronically Signed   By: Claudie Revering M.D.   On: 03/11/2018 15:42   Dg Chest 2 View  Result Date: 03/11/2018 CLINICAL DATA:  Weakness 2 weeks with elevated white blood cell count.  Near syncope. EXAM: CHEST - 2 VIEW COMPARISON:  10/15/2013 FINDINGS: Lungs are adequately inflated without focal airspace consolidation or effusion. Linear scarring over the right upper lung unchanged. Subtle prominence of the perihilar markings which may be due to mild degree of vascular congestion versus viral bronchopneumonia. Cardiomediastinal silhouette and remainder the exam is unchanged. IMPRESSION: Subtle prominence of the perihilar markings which may be due to mild vascular congestion versus viral bronchopneumonia. Electronically Signed   By: Marin Olp M.D.   On: 03/11/2018 13:28   Ct Head Wo Contrast  Result Date: 03/11/2018 CLINICAL DATA:  Near syncopal episodes. EXAM: CT HEAD WITHOUT CONTRAST CT MAXILLOFACIAL WITHOUT CONTRAST TECHNIQUE: Multidetector CT imaging of the head and maxillofacial structures were performed using the standard protocol without intravenous contrast. Multiplanar CT image reconstructions of the maxillofacial structures were also generated. COMPARISON:  Brain MRI 09/19/2012 FINDINGS: CT HEAD FINDINGS Brain: No evidence of acute infarction, hemorrhage, hydrocephalus, extra-axial collection or mass lesion/mass effect. Vascular: No hyperdense vessel or unexpected calcification. Skull: Normal. Negative for fracture or focal lesion. Other: No scalp lesions or hematoma. CT MAXILLOFACIAL FINDINGS Osseous: No acute facial bone fractures or worrisome bone lesions. Orbits: No fracture. The globes are intact. No orbital mass. The extraocular muscles appear normal. Sinuses: Mild mucoperiosteal thickening involving scattered ethmoid sinuses. There is also a small mucous retention cyst or polyp in the floor of the left maxillary sinus. No findings for acute sinusitis. The mastoid air cells and middle ear cavities are clear. Soft tissues: No soft tissue mass or hematoma. Other: Fairly extensive dental caries are noted. IMPRESSION: 1. No acute intracranial findings or skull fracture. 2. No  acute facial bone fractures or bone lesions. 3. No significant sinus disease. 4. Dental caries. Electronically Signed   By: Marijo Sanes M.D.   On: 03/11/2018 15:36   Ct Soft Tissue Neck Wo Contrast  Result Date: 03/11/2018 CLINICAL DATA:  Swelling of the neck.  Suspicion of thyroiditis. EXAM: CT NECK WITHOUT CONTRAST TECHNIQUE: Multidetector CT imaging of the neck was performed following the standard  protocol without intravenous contrast. COMPARISON:  None. FINDINGS: Pharynx and larynx: No mucosal or submucosal lesion. Salivary glands: Parotid and submandibular glands are unremarkable. Thyroid: The thyroid gland does not appear enlarged or show any focal lesion. Lymph nodes: No adenopathy. Vascular: Ordinary atherosclerosis at the carotid bifurcations. Otherwise negative. No contrast utilized. Limited intracranial: See results of head CT. Visualized orbits: See results of facial CT. Mastoids and visualized paranasal sinuses: See results of facial CT. Skeleton: Ordinary cervical spondylosis and facet arthritis. Upper chest: Negative Other: None IMPRESSION: The thyroid gland does not appear enlarged or show any focal finding. This is a noncontrast study. No other neck pathology of clinical relevance. Electronically Signed   By: Nelson Chimes M.D.   On: 03/11/2018 15:34   Ct Chest Wo Contrast  Result Date: 03/11/2018 CLINICAL DATA:  Leukocytosis. Elevated potassium and BUN. Weakness for the past 2 weeks. Metastatic melanoma. EXAM: CT CHEST, ABDOMEN AND PELVIS WITHOUT CONTRAST TECHNIQUE: Multidetector CT imaging of the chest, abdomen and pelvis was performed following the standard protocol without IV contrast. COMPARISON:  09/21/2017. FINDINGS: CT CHEST FINDINGS Cardiovascular: Atheromatous calcifications, including the coronary arteries and aorta. Mediastinum/Nodes: No enlarged mediastinal, hilar, or axillary lymph nodes. Thyroid gland, trachea, and esophagus demonstrate no significant findings. Lungs/Pleura:  7 mm left lower lobe nodule on image number 112 series 7, 8 mm previously. 5 mm left upper lobe nodule on image number 62 series 7, previously 5 mm. 6 mm subpleural nodule in the right middle lobe along the minor fissure on image number 76 series 7, previously 4 mm. This remains thin and plaque-like in the sagittal plane, unchanged in size in the sagittal plane. The previously demonstrated 3 mm right upper lobe nodule on the major fissure measures 5 mm on image number 64 series 7 today, also unchanged in the sagittal plane. No new nodules are seen. Musculoskeletal: Mild thoracic and lower cervical spine degenerative changes. CT ABDOMEN PELVIS FINDINGS Hepatobiliary: The liver remains mildly enlarged. No focal liver abnormality is seen. No gallstones, gallbladder wall thickening, or biliary dilatation. Pancreas: Unremarkable. No pancreatic ductal dilatation or surrounding inflammatory changes. Spleen: Normal in size without focal abnormality. Adrenals/Urinary Tract: The previously suspected small right renal cysts measures fluid density today. Otherwise, normal appearing adrenal glands, kidneys, ureters and urinary bladder. Stomach/Bowel: Multiple sigmoid colon diverticula. Mildly prominent stool. Normal appearing stomach, small bowel and appendix. Vascular/Lymphatic: Atheromatous arterial calcifications, including left renal artery branch calcifications peripherally. No enlarged lymph nodes. Reproductive: Status post hysterectomy. No adnexal masses. Other: Small umbilical hernia containing fat. Musculoskeletal: Mild lumbar spine degenerative changes. IMPRESSION: 1. No acute abnormality. 2. Stable small bilateral lung nodules, as described above. 3. Stable hepatomegaly. 4. Sigmoid diverticulosis. 5. Atheromatous arterial calcifications, including the coronary arteries and aorta. Electronically Signed   By: Claudie Revering M.D.   On: 03/11/2018 15:42   Ct Maxillofacial Wo Contrast  Result Date: 03/11/2018 CLINICAL  DATA:  Near syncopal episodes. EXAM: CT HEAD WITHOUT CONTRAST CT MAXILLOFACIAL WITHOUT CONTRAST TECHNIQUE: Multidetector CT imaging of the head and maxillofacial structures were performed using the standard protocol without intravenous contrast. Multiplanar CT image reconstructions of the maxillofacial structures were also generated. COMPARISON:  Brain MRI 09/19/2012 FINDINGS: CT HEAD FINDINGS Brain: No evidence of acute infarction, hemorrhage, hydrocephalus, extra-axial collection or mass lesion/mass effect. Vascular: No hyperdense vessel or unexpected calcification. Skull: Normal. Negative for fracture or focal lesion. Other: No scalp lesions or hematoma. CT MAXILLOFACIAL FINDINGS Osseous: No acute facial bone fractures or worrisome bone lesions. Orbits: No  fracture. The globes are intact. No orbital mass. The extraocular muscles appear normal. Sinuses: Mild mucoperiosteal thickening involving scattered ethmoid sinuses. There is also a small mucous retention cyst or polyp in the floor of the left maxillary sinus. No findings for acute sinusitis. The mastoid air cells and middle ear cavities are clear. Soft tissues: No soft tissue mass or hematoma. Other: Fairly extensive dental caries are noted. IMPRESSION: 1. No acute intracranial findings or skull fracture. 2. No acute facial bone fractures or bone lesions. 3. No significant sinus disease. 4. Dental caries. Electronically Signed   By: Marijo Sanes M.D.   On: 03/11/2018 15:36    Pending Labs Unresulted Labs (From admission, onward)    Start     Ordered   03/11/18 1225  Blood culture (routine x 2)  BLOOD CULTURE X 2,   STAT     03/11/18 1224   Signed and Held  CBC  (enoxaparin (LOVENOX)    CrCl < 30 ml/min)  Once,   R    Comments:  Baseline for enoxaparin therapy IF NOT ALREADY DRAWN.  Notify MD if PLT < 100 K.    Signed and Held   Signed and Held  Creatinine, serum  (enoxaparin (LOVENOX)    CrCl < 30 ml/min)  Once,   R    Comments:  Baseline for  enoxaparin therapy IF NOT ALREADY DRAWN.    Signed and Held   Signed and Held  Creatinine, serum  (enoxaparin (LOVENOX)    CrCl < 30 ml/min)  Weekly,   R    Comments:  while on enoxaparin therapy.    Signed and Held   Signed and Held  Urinalysis, Complete w Microscopic  Once,   R     Signed and Held   Signed and Held  Comprehensive metabolic panel  Tomorrow morning,   R     Signed and Held   Signed and Held  CBC  Tomorrow morning,   R     Signed and Held   Signed and Held  Protime-INR  Tomorrow morning,   R     Signed and Held          Vitals/Pain Today's Vitals   03/11/18 1230 03/11/18 1338 03/11/18 1408 03/11/18 1600  BP: (!) 138/91 135/60 (!) 146/55 (!) 148/65  Pulse: 61 60 61   Resp: (!) 23 (!) 21 (!) 23 15  Temp:      TempSrc:      SpO2: 97% 97% 97% 91%  Weight:      Height:        Isolation Precautions No active isolations  Medications Medications  sodium chloride 0.9 % bolus 1,000 mL (0 mLs Intravenous Stopped 03/11/18 1420)  sodium polystyrene (KAYEXALATE) 15 GM/60ML suspension 30 g (30 g Oral Given 03/11/18 1344)  sodium bicarbonate injection 50 mEq (50 mEq Intravenous Given 03/11/18 1344)  vancomycin (VANCOCIN) IVPB 1000 mg/200 mL premix (0 mg Intravenous Stopped 03/11/18 1646)  ceFEPIme (MAXIPIME) 2 g in sodium chloride 0.9 % 100 mL IVPB (0 g Intravenous Stopped 03/11/18 1507)    Mobility walks

## 2018-03-11 NOTE — ED Notes (Signed)
Ed provider Darl Householder made aware patient has critical Lactic Acid value of 2.05

## 2018-03-11 NOTE — Progress Notes (Signed)
Critical lab result received this am. Pt called and notified to go to the ER. She states she will go to [facility] in "a few hours after her son gets some sleep." She was instructed to go ASAP. Instructed to call 911 if son could not take her and she had any worsening of sx, any new sx in chest, CP, near syncope, SOB, palpitations.

## 2018-03-11 NOTE — ED Provider Notes (Signed)
Katelyn Cline   CSN: [id number] Arrival date & time: 03/11/18  1104     History   Chief Complaint Chief Complaint  Patient presents with  . Abnormal Lab  . Weakness    HPI Katelyn Cline is a 57 y.o. female hx of DM, COPD on 2 L [place] at baseline, polycythemia vera, metastatic melanoma, here presenting with leukocytosis, weakness, near syncope.  Patient states that she recently had a toe infection and just finished a course of clindamycin about 5 days ago.  She was up with her doctor yesterday and her white count is increasing from 29,000 several weeks ago to now 32,000.  She also hyperkalemic to 6.6.  Patient states that she just feels weak all over and very tired.  She has also some headaches but denies any fevers.  She felt like she is going to pass out but denies actual syncope's.  Patient states that she follows up with oncology at any pain and last time she had phlebotomy was about 6 months ago.  Her last CT chest/ abdomen pelvis was also 6 months ago. She was told that her melanoma was in remission and she is not currently on any chemo.   The history is provided by the patient.    Past Medical History:  Diagnosis Date  . Agoraphobia   . Basal cell carcinoma   . Chronic pain   . Chronic respiratory failure with hypoxia ([name])    3L [place]  . DM type 2 (diabetes mellitus, type 2) ([place]) 10/16/2013  . Dysrhythmia    Hx: of palpitations "a long time ago"  . Elevated hemoglobin ([place]) 2014  . Fibromyalgia   . History of UTI   . HTN (hypertension)   . Hyperlipidemia   . Major depression   . Metastatic melanoma ([place]) 10/18/2012   12 mm posterior right upper lobe pulmonary nodule, max SUV 3.0    . Migraines   . Miscarriage    x 4  . Narcolepsy   . Panic attacks   . Peripheral neuropathy   . Peripheral neuropathy    in feet  . Polycythemia secondary to smoking   . Polycythemia vera(238.4)   . Scoliosis   . Shortness of breath    Hx: of with activity  . Sleep apnea   . Vertigo     Patient Active Problem List   Diagnosis Date Noted  . Aortic atherosclerosis ([place]) 02/21/2018  . Constipation 05/28/2015  . OE (otitis externa) 01/04/2015  . Seborrheic keratoses 08/24/2014  . PVC (premature ventricular contraction) 05/18/2014  . Gastroenteritis 02/15/2014  . Diabetes mellitus type II, controlled ([place]) 02/06/2013  . Chronic hypoxemic respiratory failure ([place]) 02/06/2013  . History of basal cell carcinoma 12/01/2012  . Posttraumatic stress disorder 12/01/2012  . History of malignant melanoma 10/18/2012  . Polycythemia vera ([place]) 08/05/2012  . Hyperlipidemia 04/09/2012  . Narcolepsy 04/09/2012  . Peripheral neuropathy 04/09/2012  . Tobacco use disorder 04/09/2012  . Obesity 04/09/2012  . Essential hypertension, benign 04/09/2012  . DDD (degenerative disc disease), lumbar 02/16/2012  . DDD (degenerative disc disease), cervical 02/16/2012  . Chronic pain disorder 02/16/2012  . Depression 02/16/2012  . Fibromyalgia 02/16/2012    Past Surgical History:  Procedure Laterality Date  . BASAL CELL CARCINOMA EXCISION     flap surgery on face  . BREAST LUMPECTOMY     bilateral  . COLONOSCOPY W/ BIOPSIES AND POLYPECTOMY     Hx: of  . CYSTECTOMY  abdominal wall   . DILATION AND CURETTAGE OF UTERUS     Hx: of   . VAGINAL HYSTERECTOMY    . VIDEO ASSISTED THORACOSCOPY (VATS)/WEDGE RESECTION Right 10/21/2012   Procedure: VIDEO ASSISTED THORACOSCOPY (VATS)/WEDGE RESECTION;  Surgeon: [name], MD;  Location: [facility];  Service: Thoracic;  Laterality: Right;  Marland Kitchen VIDEO BRONCHOSCOPY N/A 10/21/2012   Procedure: VIDEO BRONCHOSCOPY;  Surgeon: [name], MD;  Location: [facility];  Service: Thoracic;  Laterality: N/A;     OB History   None      Home Medications    Prior to Admission medications   Medication Sig Start Date End Date Taking? Authorizing Provider  amLODipine (NORVASC) 5 MG tablet TAKE 1 TABLET  BY MOUTH EVERY DAY 01/07/18  Yes [place], Modena Nunnery, MD  atorvastatin (LIPITOR) 80 MG tablet Take 1 tablet (80 mg total) by mouth daily at 6 PM. 10/19/17  Yes [place], Modena Nunnery, MD  DULoxetine (CYMBALTA) 60 MG capsule TAKE 2 CAPSULES BY MOUTH EVERY DAY 02/25/18  Yes [place], Modena Nunnery, MD  empagliflozin (JARDIANCE) 25 MG TABS tablet Take 25 mg by mouth daily. 10/19/17  Yes [place], Modena Nunnery, MD  fenofibrate (TRICOR) 48 MG tablet TAKE 1 TABLET(48 MG) BY MOUTH DAILY Patient taking differently: Take 48 mg by mouth daily.  01/07/18  Yes [place], Modena Nunnery, MD  fish oil-omega-3 fatty acids 1000 MG capsule Take 2 g by mouth 2 (two) times daily.   Yes [provider]  gabapentin (NEURONTIN) 100 MG capsule TAKE 1 TO 3 CAPSULES(100 TO 300 MG) BY MOUTH THREE TIMES DAILY AS NEEDED FOR NERVE PAIN Patient taking differently: Take 100 mg by mouth 3 (three) times daily as needed (for pain).  02/25/18  Yes [place], Modena Nunnery, MD  ibuprofen (ADVIL,MOTRIN) 800 MG tablet TAKE 1 TABLET BY MOUTH EVERY 8 HOURS AS NEEDED Patient taking differently: Take 800 mg by mouth every 8 (eight) hours as needed for moderate pain.  02/25/18  Yes [place], Modena Nunnery, MD  lisinopril (PRINIVIL,ZESTRIL) 5 MG tablet TAKE 1 TABLET BY MOUTH DAILY Patient taking differently: Take 5 mg by mouth daily.  01/07/18  Yes [place], Modena Nunnery, MD  metFORMIN (GLUCOPHAGE) 1000 MG tablet TAKE 1 TABLET BY MOUTH TWICE DAILY WITH A MEAL Patient taking differently: Take 1,000 mg by mouth 2 (two) times daily with a meal.  12/08/17  Yes [place], Modena Nunnery, MD  oxyCODONE-acetaminophen (PERCOCET) 7.5-325 MG tablet Take 1 tablet by mouth 3 (three) times daily as needed for severe pain. 02/21/18  Yes [place], Modena Nunnery, MD  Pseudoeph-Doxylamine-DM-APAP (NYQUIL PO) Take 1-2 capsules by mouth at bedtime as needed (for cold/flu).   Yes [provider]  tiZANidine (ZANAFLEX) 4 MG tablet Take 1 tablet (4 mg total) by mouth 2 (two) times daily as needed for muscle  spasms. 02/21/18  Yes [place], Modena Nunnery, MD  triamterene-hydrochlorothiazide (DYAZIDE) 37.5-25 MG capsule TAKE 1 CAPSULE BY MOUTH EVERY MORNING Patient taking differently: Take 1 capsule by mouth daily.  12/24/17  Yes [place], Modena Nunnery, MD  Blood Glucose Monitoring Suppl (BLOOD GLUCOSE SYSTEM PAK) KIT Use as directed to monitor FSBS up to 3x daily for fluctuating FSBS. Dx: E11.65 02/21/18   [name], MD  clindamycin (CLEOCIN) 300 MG capsule Take 1 capsule (300 mg total) by mouth 3 (three) times daily. Patient not taking: Reported on 03/11/2018 02/21/18   [name], MD  diphenhydrAMINE (BENADRYL) 50 MG tablet Take 1 tablet 1 hour hour prior to study  Patient not taking: Reported on 03/11/2018 09/06/17   Holley Bouche, NP  Glucose Blood (BLOOD GLUCOSE TEST STRIPS) STRP Please dispense based on insurance and patient preference. Use as directed to monitor FSBS 1x daily. Dx: E11.9. 10/12/17   [name], MD  MICROLET LANCETS MISC Please dispense based on patient and insurance preference. Use as directed to monitor  FSBS 1x daily. Dx: E11.9. 10/12/17   [name], MD  valACYclovir (VALTREX) 1000 MG tablet Take 1 tablet (1,000 mg total) by mouth 2 (two) times daily. 03/10/18   Delsa Grana, PA-C    Family History Family History  Problem Relation Age of Onset  . Arthritis Mother   . Hyperlipidemia Mother   . Depression Mother   . Anxiety disorder Mother   . Dementia Mother   . Hypertension Father   . Hyperlipidemia Father   . Heart disease Father   . Stroke Father   . Dementia Father   . Heart disease Brother   . ADD / ADHD Son   . Alcohol abuse Maternal Grandfather   . Bipolar disorder Neg Hx   . Drug abuse Neg Hx   . OCD Neg Hx   . Paranoid behavior Neg Hx   . Schizophrenia Neg Hx   . Seizures Neg Hx   . Sexual abuse Neg Hx   . Physical abuse Neg Hx     Social History Social History   Tobacco Use  . Smoking status: Current Every Day Smoker    Packs/day:  1.00    Years: 10.00    Pack years: 10.00    Types: Cigarettes    Last attempt to quit: 09/16/2012    Years since quitting: 5.4  . Smokeless tobacco: Never Used  Substance Use Topics  . Alcohol use: Yes    Comment: 1 -2 drinks a month, occasional  . Drug use: No     Allergies   Contrast media [iodinated diagnostic agents]; Erythromycin; Flagyl [metronidazole]; Latex; Other; Penicillins; and Tetracyclines & related   Review of Systems Review of Systems  Neurological: Positive for weakness.  All other systems reviewed and are negative.    Physical Exam Updated Vital Signs BP (!) 146/55   Pulse 61   Temp 98.2 F (36.8 C) (Oral)   Resp (!) 23   Ht '5\' 8"'$  (1.727 m)   Wt 103.5 kg   SpO2 97%   BMI 34.69 kg/m   Physical Exam  Constitutional: She is oriented to person, place, and time.  Chronically ill   HENT:  Head: Normocephalic.  MM dry, poor dentition overall. Missing multiple teeth. There is multiple cavities on the upper teeth but no obvious periapical abscess  Eyes: Pupils are equal, round, and reactive to light. Conjunctivae and EOM are normal.  Neck:  Mild diffuse cervical LAD   Cardiovascular: Normal rate, regular rhythm and normal heart sounds.  Pulmonary/Chest: Effort normal.  No crackles   Abdominal: Soft. Bowel sounds are normal. She exhibits no distension. There is no tenderness. There is no guarding.  Musculoskeletal: Normal range of motion.  Neurological: She is alert and oriented to person, place, and time. No cranial nerve deficit. Coordination normal.  Skin: Skin is warm.  Psychiatric: She has a normal mood and affect.  Nursing Cline and vitals reviewed.    ED Treatments / Results  Labs (all labs ordered are listed, but only abnormal results are displayed) Labs Reviewed  BASIC METABOLIC PANEL - Abnormal; Notable for the following components:  Result Value   Potassium 5.5 (*)    Glucose, Bld 155 (*)    BUN 32 (*)    All other components  within normal limits  CBC - Abnormal; Notable for the following components:   WBC 32.7 (*)    RBC 7.16 (*)    HCT 47.7 (*)    MCV 66.6 (*)    MCH 20.4 (*)    RDW 21.8 (*)    Platelets 649 (*)    All other components within normal limits  URINALYSIS, ROUTINE W REFLEX MICROSCOPIC - Abnormal; Notable for the following components:   Glucose, UA >=500 (*)    Leukocytes, UA TRACE (*)    Bacteria, UA RARE (*)    All other components within normal limits  CBG MONITORING, ED - Abnormal; Notable for the following components:   Glucose-Capillary 152 (*)    All other components within normal limits  I-STAT CG4 LACTIC ACID, ED - Abnormal; Notable for the following components:   Lactic Acid, Venous 2.05 (*)    All other components within normal limits  CULTURE, BLOOD (ROUTINE X 2)  CULTURE, BLOOD (ROUTINE X 2)  I-STAT CG4 LACTIC ACID, ED    EKG EKG Interpretation  Date/Time:  Friday March 11 2018 12:05:21 EDT Ventricular Rate:  61 PR Interval:    QRS Duration: 93 QT Interval:  405 QTC Calculation: 408 R Axis:   12 Text Interpretation:  Sinus rhythm Anterior infarct, old Baseline wander in lead(s) II III aVF No significant change since last tracing Confirmed by Wandra Arthurs [phone]) on 03/11/2018 12:30:46 PM Also confirmed by Wandra Arthurs [phone]), editor Philomena Doheny [phone])  on 03/11/2018 2:21:25 PM   Radiology Ct Abdomen Pelvis Wo Contrast  Result Date: 03/11/2018 CLINICAL DATA:  Leukocytosis. Elevated potassium and BUN. Weakness for the past 2 weeks. Metastatic melanoma. EXAM: CT CHEST, ABDOMEN AND PELVIS WITHOUT CONTRAST TECHNIQUE: Multidetector CT imaging of the chest, abdomen and pelvis was performed following the standard protocol without IV contrast. COMPARISON:  09/21/2017. FINDINGS: CT CHEST FINDINGS Cardiovascular: Atheromatous calcifications, including the coronary arteries and aorta. Mediastinum/Nodes: No enlarged mediastinal, hilar, or axillary lymph nodes. Thyroid gland,  trachea, and esophagus demonstrate no significant findings. Lungs/Pleura: 7 mm left lower lobe nodule on image number 112 series 7, 8 mm previously. 5 mm left upper lobe nodule on image number 62 series 7, previously 5 mm. 6 mm subpleural nodule in the right middle lobe along the minor fissure on image number 76 series 7, previously 4 mm. This remains thin and plaque-like in the sagittal plane, unchanged in size in the sagittal plane. The previously demonstrated 3 mm right upper lobe nodule on the major fissure measures 5 mm on image number 64 series 7 today, also unchanged in the sagittal plane. No new nodules are seen. Musculoskeletal: Mild thoracic and lower cervical spine degenerative changes. CT ABDOMEN PELVIS FINDINGS Hepatobiliary: The liver remains mildly enlarged. No focal liver abnormality is seen. No gallstones, gallbladder wall thickening, or biliary dilatation. Pancreas: Unremarkable. No pancreatic ductal dilatation or surrounding inflammatory changes. Spleen: Normal in size without focal abnormality. Adrenals/Urinary Tract: The previously suspected small right renal cysts measures fluid density today. Otherwise, normal appearing adrenal glands, kidneys, ureters and urinary bladder. Stomach/Bowel: Multiple sigmoid colon diverticula. Mildly prominent stool. Normal appearing stomach, small bowel and appendix. Vascular/Lymphatic: Atheromatous arterial calcifications, including left renal artery branch calcifications peripherally. No enlarged lymph nodes. Reproductive: Status post hysterectomy. No adnexal masses. Other: Small umbilical hernia containing fat. Musculoskeletal: Mild  lumbar spine degenerative changes. IMPRESSION: 1. No acute abnormality. 2. Stable small bilateral lung nodules, as described above. 3. Stable hepatomegaly. 4. Sigmoid diverticulosis. 5. Atheromatous arterial calcifications, including the coronary arteries and aorta. Electronically Signed   By: Claudie Revering M.D.   On: 03/11/2018  15:42   Dg Chest 2 View  Result Date: 03/11/2018 CLINICAL DATA:  Weakness 2 weeks with elevated white blood cell count. Near syncope. EXAM: CHEST - 2 VIEW COMPARISON:  10/15/2013 FINDINGS: Lungs are adequately inflated without focal airspace consolidation or effusion. Linear scarring over the right upper lung unchanged. Subtle prominence of the perihilar markings which may be due to mild degree of vascular congestion versus viral bronchopneumonia. Cardiomediastinal silhouette and remainder the exam is unchanged. IMPRESSION: Subtle prominence of the perihilar markings which may be due to mild vascular congestion versus viral bronchopneumonia. Electronically Signed   By: Marin Olp M.D.   On: 03/11/2018 13:28   Ct Head Wo Contrast  Result Date: 03/11/2018 CLINICAL DATA:  Near syncopal episodes. EXAM: CT HEAD WITHOUT CONTRAST CT MAXILLOFACIAL WITHOUT CONTRAST TECHNIQUE: Multidetector CT imaging of the head and maxillofacial structures were performed using the standard protocol without intravenous contrast. Multiplanar CT image reconstructions of the maxillofacial structures were also generated. COMPARISON:  Brain MRI 09/19/2012 FINDINGS: CT HEAD FINDINGS Brain: No evidence of acute infarction, hemorrhage, hydrocephalus, extra-axial collection or mass lesion/mass effect. Vascular: No hyperdense vessel or unexpected calcification. Skull: Normal. Negative for fracture or focal lesion. Other: No scalp lesions or hematoma. CT MAXILLOFACIAL FINDINGS Osseous: No acute facial bone fractures or worrisome bone lesions. Orbits: No fracture. The globes are intact. No orbital mass. The extraocular muscles appear normal. Sinuses: Mild mucoperiosteal thickening involving scattered ethmoid sinuses. There is also a small mucous retention cyst or polyp in the floor of the left maxillary sinus. No findings for acute sinusitis. The mastoid air cells and middle ear cavities are clear. Soft tissues: No soft tissue mass or  hematoma. Other: Fairly extensive dental caries are noted. IMPRESSION: 1. No acute intracranial findings or skull fracture. 2. No acute facial bone fractures or bone lesions. 3. No significant sinus disease. 4. Dental caries. Electronically Signed   By: Marijo Sanes M.D.   On: 03/11/2018 15:36   Ct Soft Tissue Neck Wo Contrast  Result Date: 03/11/2018 CLINICAL DATA:  Swelling of the neck.  Suspicion of thyroiditis. EXAM: CT NECK WITHOUT CONTRAST TECHNIQUE: Multidetector CT imaging of the neck was performed following the standard protocol without intravenous contrast. COMPARISON:  None. FINDINGS: Pharynx and larynx: No mucosal or submucosal lesion. Salivary glands: Parotid and submandibular glands are unremarkable. Thyroid: The thyroid gland does not appear enlarged or show any focal lesion. Lymph nodes: No adenopathy. Vascular: Ordinary atherosclerosis at the carotid bifurcations. Otherwise negative. No contrast utilized. Limited intracranial: See results of head CT. Visualized orbits: See results of facial CT. Mastoids and visualized paranasal sinuses: See results of facial CT. Skeleton: Ordinary cervical spondylosis and facet arthritis. Upper chest: Negative Other: None IMPRESSION: The thyroid gland does not appear enlarged or show any focal finding. This is a noncontrast study. No other neck pathology of clinical relevance. Electronically Signed   By: Nelson Chimes M.D.   On: 03/11/2018 15:34   Ct Chest Wo Contrast  Result Date: 03/11/2018 CLINICAL DATA:  Leukocytosis. Elevated potassium and BUN. Weakness for the past 2 weeks. Metastatic melanoma. EXAM: CT CHEST, ABDOMEN AND PELVIS WITHOUT CONTRAST TECHNIQUE: Multidetector CT imaging of the chest, abdomen and pelvis was performed following the standard  protocol without IV contrast. COMPARISON:  09/21/2017. FINDINGS: CT CHEST FINDINGS Cardiovascular: Atheromatous calcifications, including the coronary arteries and aorta. Mediastinum/Nodes: No enlarged  mediastinal, hilar, or axillary lymph nodes. Thyroid gland, trachea, and esophagus demonstrate no significant findings. Lungs/Pleura: 7 mm left lower lobe nodule on image number 112 series 7, 8 mm previously. 5 mm left upper lobe nodule on image number 62 series 7, previously 5 mm. 6 mm subpleural nodule in the right middle lobe along the minor fissure on image number 76 series 7, previously 4 mm. This remains thin and plaque-like in the sagittal plane, unchanged in size in the sagittal plane. The previously demonstrated 3 mm right upper lobe nodule on the major fissure measures 5 mm on image number 64 series 7 today, also unchanged in the sagittal plane. No new nodules are seen. Musculoskeletal: Mild thoracic and lower cervical spine degenerative changes. CT ABDOMEN PELVIS FINDINGS Hepatobiliary: The liver remains mildly enlarged. No focal liver abnormality is seen. No gallstones, gallbladder wall thickening, or biliary dilatation. Pancreas: Unremarkable. No pancreatic ductal dilatation or surrounding inflammatory changes. Spleen: Normal in size without focal abnormality. Adrenals/Urinary Tract: The previously suspected small right renal cysts measures fluid density today. Otherwise, normal appearing adrenal glands, kidneys, ureters and urinary bladder. Stomach/Bowel: Multiple sigmoid colon diverticula. Mildly prominent stool. Normal appearing stomach, small bowel and appendix. Vascular/Lymphatic: Atheromatous arterial calcifications, including left renal artery branch calcifications peripherally. No enlarged lymph nodes. Reproductive: Status post hysterectomy. No adnexal masses. Other: Small umbilical hernia containing fat. Musculoskeletal: Mild lumbar spine degenerative changes. IMPRESSION: 1. No acute abnormality. 2. Stable small bilateral lung nodules, as described above. 3. Stable hepatomegaly. 4. Sigmoid diverticulosis. 5. Atheromatous arterial calcifications, including the coronary arteries and aorta.  Electronically Signed   By: Claudie Revering M.D.   On: 03/11/2018 15:42   Ct Maxillofacial Wo Contrast  Result Date: 03/11/2018 CLINICAL DATA:  Near syncopal episodes. EXAM: CT HEAD WITHOUT CONTRAST CT MAXILLOFACIAL WITHOUT CONTRAST TECHNIQUE: Multidetector CT imaging of the head and maxillofacial structures were performed using the standard protocol without intravenous contrast. Multiplanar CT image reconstructions of the maxillofacial structures were also generated. COMPARISON:  Brain MRI 09/19/2012 FINDINGS: CT HEAD FINDINGS Brain: No evidence of acute infarction, hemorrhage, hydrocephalus, extra-axial collection or mass lesion/mass effect. Vascular: No hyperdense vessel or unexpected calcification. Skull: Normal. Negative for fracture or focal lesion. Other: No scalp lesions or hematoma. CT MAXILLOFACIAL FINDINGS Osseous: No acute facial bone fractures or worrisome bone lesions. Orbits: No fracture. The globes are intact. No orbital mass. The extraocular muscles appear normal. Sinuses: Mild mucoperiosteal thickening involving scattered ethmoid sinuses. There is also a small mucous retention cyst or polyp in the floor of the left maxillary sinus. No findings for acute sinusitis. The mastoid air cells and middle ear cavities are clear. Soft tissues: No soft tissue mass or hematoma. Other: Fairly extensive dental caries are noted. IMPRESSION: 1. No acute intracranial findings or skull fracture. 2. No acute facial bone fractures or bone lesions. 3. No significant sinus disease. 4. Dental caries. Electronically Signed   By: Marijo Sanes M.D.   On: 03/11/2018 15:36    Procedures Procedures (including critical care time)  CRITICAL CARE Performed by: Wandra Arthurs   Total critical care time: 30 minutes  Critical care time was exclusive of separately billable procedures and treating other patients.  Critical care was necessary to treat or prevent imminent or life-threatening deterioration.  Critical care  was time spent personally by me on the following activities: development of  treatment plan with patient and/or surrogate as well as nursing, discussions with consultants, evaluation of patient's response to treatment, examination of patient, obtaining history from patient or surrogate, ordering and performing treatments and interventions, ordering and review of laboratory studies, ordering and review of radiographic studies, pulse oximetry and re-evaluation of patient's condition.   Medications Ordered in ED Medications  vancomycin (VANCOCIN) IVPB 1000 mg/200 mL premix (1,000 mg Intravenous New Bag/Given 03/11/18 1509)  sodium chloride 0.9 % bolus 1,000 mL (0 mLs Intravenous Stopped 03/11/18 1420)  sodium polystyrene (KAYEXALATE) 15 GM/60ML suspension 30 g (30 g Oral Given 03/11/18 1344)  sodium bicarbonate injection 50 mEq (50 mEq Intravenous Given 03/11/18 1344)  ceFEPIme (MAXIPIME) 2 g in sodium chloride 0.9 % 100 mL IVPB (0 g Intravenous Stopped 03/11/18 1507)     Initial Impression / Assessment and Plan / ED Course  I have reviewed the triage vital signs and the nursing notes.  Pertinent labs & imaging results that were available during my care of the patient were reviewed by me and considered in my medical decision making (see chart for details).     Katelyn Cline is a 57 y.o. female hx of polycythemia vera, metastatic melanoma here with rising leukocytosis, hyperkalemia. Patient chronically ill appearing. I am concerned for possible leukemia vs spreading of melanoma vs brain abscess from dental infection. She has anaphylaxis to IV dye so will get noncontrast CT head/neck/face/chest/ab/pel. Will do sepsis workup and will likely need admission. Will consult oncology as well.   2 pm Lactate 2. CXR showed possible pneumonia vs mass. I talked to Dr. Marin Olp, who agreed with pan scan. Wanted to consider hydroxyurea for polycythemia as well.   3:59 PM CXR showed pneumonia. WBC still 32.  Lactate 2.0. K 5.5 but no EKG changes and I gave her kayexelate and bicarb. CT showed no bone lesions or abscesses or recurrent mass. Will admit for IV abx, hyperkalemia. If leukocytosis doesn't improve, then oncology will see patient and may do bone marrow biopsy to r/o leukemia.   3:59 PM I talked to Dr. Verlon Au, hospitalist, who will call Dr. Marin Olp directly. He may consider transfer to [facility] if there is concern for leukemia. However, patient had no blasts in yesterday CBC. WBC stable from yesterday. Signed out to Dr. Venora Maples to follow up recommendations from Dr. Verlon Au.    Final Clinical Impressions(s) / ED Diagnoses   Final diagnoses:  Other elevated white blood cell (WBC) count    ED Discharge Orders    None       Drenda Freeze, MD 03/11/18 1559

## 2018-03-11 NOTE — ED Notes (Signed)
Patient aware we need urine sample. Patient will call out when ready to void.

## 2018-03-11 NOTE — H&P (Addendum)
HPI  [name] HKV:[id number] DOB: 1961-01-25 DOA: 03/11/2018  PCP: [name], MD   Chief Complaint: Katelyn Cline  HPI:  57 year old female metastatic melanoma BRAF negative, right lung resection 2014, Jak 2+ P vera for cell CA excision, lumpectomy of bilateral breast Other comorbid's include HTN, neuropathy, DM TY 2 poor control, depression/bipolar  Patient was given Rocephin 8/26 given clindamycin 3 times daily and told to follow-up with an old therapy which she did not-she also has not been checking her blood sugar and has chronic pain fibromyalgia peripheral neuropathy and degenerative disc disease etc etc Treated at PCP office with a week of vaginal swelling after wipingcontrolled and has been taking clindamycin for severe dental infection-she is also chronic smoker  Comes to the emergency room on the advice of her PA will obtain basic labs done are noted elevation of white count and platelet count with predominant neutrophilia and increase in all cell lines  CT abdomen pelvis showed no acute abnormality diverticulosis of neck showed no enlargement and showed no intracranial findings but dental caries facial CT showed no anomaly  ED Course: Patient was started on cefepime vancomycin given sodium bicarb as well as Kayexalate for renal issues Oncology Enever was consulted  given a bolus of fluids  Review of Systems:   + fever,no visual changes, nosore throat, rash, new muscle aches, chest pain, SOB, +dysuria because of lesions, bleeding, n/v/abdominal pain.  Past Medical History:  Diagnosis Date  . Agoraphobia   . Basal cell carcinoma   . Chronic pain   . Chronic respiratory failure with hypoxia ([name])    3L [place]  . DM type 2 (diabetes mellitus, type 2) (Katelyn Cline) 10/16/2013  . Dysrhythmia    Hx: of palpitations "a long time ago"  . Elevated hemoglobin ([place]) 2014  . Fibromyalgia   . History of UTI   . HTN (hypertension)   . Hyperlipidemia   . Major depression   .  Metastatic melanoma ([name]) 10/18/2012   12 mm posterior right upper lobe pulmonary nodule, max SUV 3.0    . Migraines   . Miscarriage    x 4  . Narcolepsy   . Panic attacks   . Peripheral neuropathy   . Peripheral neuropathy    in feet  . Polycythemia secondary to smoking   . Polycythemia vera(238.4)   . Scoliosis   . Shortness of breath    Hx: of with activity  . Sleep apnea   . Vertigo     Past Surgical History:  Procedure Laterality Date  . BASAL CELL CARCINOMA EXCISION     flap surgery on face  . BREAST LUMPECTOMY     bilateral  . COLONOSCOPY W/ BIOPSIES AND POLYPECTOMY     Hx: of  . CYSTECTOMY     abdominal wall   . DILATION AND CURETTAGE OF UTERUS     Hx: of   . VAGINAL HYSTERECTOMY    . VIDEO ASSISTED THORACOSCOPY (VATS)/WEDGE RESECTION Right 10/21/2012   Procedure: VIDEO ASSISTED THORACOSCOPY (VATS)/WEDGE RESECTION;  Surgeon: [name], MD;  Location: [facility];  Service: Thoracic;  Laterality: Right;  Marland Kitchen VIDEO BRONCHOSCOPY N/A 10/21/2012   Procedure: VIDEO BRONCHOSCOPY;  Surgeon: [name], MD;  Location: [facility];  Service: Thoracic;  Laterality: N/A;     reports that she has been smoking cigarettes. She has a 10.00 pack-year smoking history. She has never used smokeless tobacco. She reports that she drinks alcohol. She reports that she does not use  drugs. Mobility: independent Lives with son  Allergies  Allergen Reactions  . Contrast Media [Iodinated Diagnostic Agents] Anaphylaxis  . Erythromycin   . Flagyl [Metronidazole] Other (See Comments)    Generalized pain.  . Latex Other (See Comments)    Was told to be careful because of Dye allergy  . Other     Iodine Dye  . Penicillins Other (See Comments)    Patient was an infant, no idea of reaction. Tolerates Keflex.  . Tetracyclines & Related Other (See Comments)    GI side effects    Family History  Problem Relation Age of Onset  . Arthritis Mother   . Hyperlipidemia Mother   . Depression  Mother   . Anxiety disorder Mother   . Dementia Mother   . Hypertension Father   . Hyperlipidemia Father   . Heart disease Father   . Stroke Father   . Dementia Father   . Heart disease Brother   . ADD / ADHD Son   . Alcohol abuse Maternal Grandfather   . Bipolar disorder Neg Hx   . Drug abuse Neg Hx   . OCD Neg Hx   . Paranoid behavior Neg Hx   . Schizophrenia Neg Hx   . Seizures Neg Hx   . Sexual abuse Neg Hx   . Physical abuse Neg Hx      Prior to Admission medications   Medication Sig Start Date End Date Taking? Authorizing Provider  amLODipine (NORVASC) 5 MG tablet TAKE 1 TABLET BY MOUTH EVERY DAY 01/07/18  Yes [place], Modena Nunnery, MD  atorvastatin (LIPITOR) 80 MG tablet Take 1 tablet (80 mg total) by mouth daily at 6 PM. 10/19/17  Yes [place], Modena Nunnery, MD  DULoxetine (CYMBALTA) 60 MG capsule TAKE 2 CAPSULES BY MOUTH EVERY DAY 02/25/18  Yes [place], Modena Nunnery, MD  empagliflozin (JARDIANCE) 25 MG TABS tablet Take 25 mg by mouth daily. 10/19/17  Yes [place], Modena Nunnery, MD  fenofibrate (TRICOR) 48 MG tablet TAKE 1 TABLET(48 MG) BY MOUTH DAILY Patient taking differently: Take 48 mg by mouth daily.  01/07/18  Yes [place], Modena Nunnery, MD  fish oil-omega-3 fatty acids 1000 MG capsule Take 2 g by mouth 2 (two) times daily.   Yes [provider]  gabapentin (NEURONTIN) 100 MG capsule TAKE 1 TO 3 CAPSULES(100 TO 300 MG) BY MOUTH THREE TIMES DAILY AS NEEDED FOR NERVE PAIN Patient taking differently: Take 100 mg by mouth 3 (three) times daily as needed (for pain).  02/25/18  Yes [place], Modena Nunnery, MD  ibuprofen (ADVIL,MOTRIN) 800 MG tablet TAKE 1 TABLET BY MOUTH EVERY 8 HOURS AS NEEDED Patient taking differently: Take 800 mg by mouth every 8 (eight) hours as needed for moderate pain.  02/25/18  Yes [place], Modena Nunnery, MD  lisinopril (PRINIVIL,ZESTRIL) 5 MG tablet TAKE 1 TABLET BY MOUTH DAILY Patient taking differently: Take 5 mg by mouth daily.  01/07/18  Yes [place], Modena Nunnery, MD  metFORMIN  (GLUCOPHAGE) 1000 MG tablet TAKE 1 TABLET BY MOUTH TWICE DAILY WITH A MEAL Patient taking differently: Take 1,000 mg by mouth 2 (two) times daily with a meal.  12/08/17  Yes [place], Modena Nunnery, MD  oxyCODONE-acetaminophen (PERCOCET) 7.5-325 MG tablet Take 1 tablet by mouth 3 (three) times daily as needed for severe pain. 02/21/18  Yes [place], Modena Nunnery, MD  Pseudoeph-Doxylamine-DM-APAP (NYQUIL PO) Take 1-2 capsules by mouth at bedtime as needed (for cold/flu).   Yes [provider]  tiZANidine (ZANAFLEX) 4 MG  tablet Take 1 tablet (4 mg total) by mouth 2 (two) times daily as needed for muscle spasms. 02/21/18  Yes [place], Modena Nunnery, MD  triamterene-hydrochlorothiazide (DYAZIDE) 37.5-25 MG capsule TAKE 1 CAPSULE BY MOUTH EVERY MORNING Patient taking differently: Take 1 capsule by mouth daily.  12/24/17  Yes [place], Modena Nunnery, MD  Blood Glucose Monitoring Suppl (BLOOD GLUCOSE SYSTEM PAK) KIT Use as directed to monitor FSBS up to 3x daily for fluctuating FSBS. Dx: E11.65 02/21/18   [name], MD  clindamycin (CLEOCIN) 300 MG capsule Take 1 capsule (300 mg total) by mouth 3 (three) times daily. Patient not taking: Reported on 03/11/2018 02/21/18   [name], MD  diphenhydrAMINE (BENADRYL) 50 MG tablet Take 1 tablet 1 hour hour prior to study Patient not taking: Reported on 03/11/2018 09/06/17   Holley Bouche, NP  Glucose Blood (BLOOD GLUCOSE TEST STRIPS) STRP Please dispense based on insurance and patient preference. Use as directed to monitor FSBS 1x daily. Dx: E11.9. 10/12/17   [name], MD  MICROLET LANCETS MISC Please dispense based on patient and insurance preference. Use as directed to monitor  FSBS 1x daily. Dx: E11.9. 10/12/17   [name], MD  valACYclovir (VALTREX) 1000 MG tablet Take 1 tablet (1,000 mg total) by mouth 2 (two) times daily. 03/10/18   Delsa Grana, PA-C    Physical Exam:  Vitals:   03/11/18 1338 03/11/18 1408  BP: 135/60 (!) 146/55  Pulse:  60 61  Resp: (!) 21 (!) 23  Temp:    SpO2: 97% 97%     eomi ncat no pallor-epicanthic folds  cta b, no rales-post scar om R side R lung  abd soft nt nd no rebound no gaurding no rebound  No le edema  Neurologically intact nonfocal exam able to ambulate to the restroom and back coherent power 5/5 grossly sensory intact smile symmetric  Very poor dentiton with caries at the #31 vs #32  I have personally reviewed following labs and imaging studies  Labs:   Sodium 140, potassium 5.5 down from potassium 6.6 yesterday  BUN/creatinine 43/0.8-->32/0.7  Lactic acid 2.0 down to 1.7  WBC 32, hemoglobin 14, platelet 649  Imaging studies:   As above   Medical tests:   EKG independently reviewed: none    Test discussed with performing physician:  y   Decision to obtain old records:   y   Review and summation of old records:   y   Active Problems:   * No active hospital problems. *   Assessment/Plan Sepsis query cause-recent Rx for dental caries with clindamycin-been developed vaginal lesions secondary to Candida-treated over-the-counter with Monistat-then went to PCP and received Diflucan 9/12-I do not think she has disseminated Candida but I think she should be covered for this regardless so I will start Diflucan in addition tobroad spectrum Zosyn at this time She will need blood cultures and urine culture and it looks like although she is sexually inactive, herpes test was done yesterday which is still pending-wet prep for trichomoniasis was negative -Please follow herpes test-follow blood cultures urine cultures  Polycythemia vera Jak 2+-treated intermittently with phlebotomy-Dr. Marin Olp to see patient in a.m.-agree with possible use of Hydrea  History of multiple other cancers-would defer at this time to oncology further management--- he has received multiple CTs today  AKI likely secondary to the use of lisinopril, ibuprofen, Dyazide-all of which will be held-we  will start hydralazine 50 3 times daily  Diabetes mellitus  type 2-holding Jardiance and metformin given AKI-sliding scale coverage continue gabapentin 100 3 times daily carefully given renal function  Mild hyperkalemia probably secondary to AKI as above and is actually better than prior received sodium bicarb and Kayexalate in the ED-repeat labs in a.m. would not treat further at this juncture  Bipolar-continue Cymbalta  Severity of Illness: The appropriate patient status for this patient is INPATIENT. Inpatient status is judged to be reasonable and necessary in order to provide the required intensity of service to ensure the patient's safety. The patient's presenting symptoms, physical exam findings, and initial radiographic and laboratory data in the context of their chronic comorbidities is felt to place them at high risk for further clinical deterioration. Furthermore, it is not anticipated that the patient will be medically stable for discharge from the hospital within 2 midnights of admission. The following factors support the patient status of inpatient.   " The patient's presenting symptoms include infection, weakness. " The worrisome physical exam findings include weakness and dizziness. " The initial radiographic and laboratory data are worrisome because of hyperkalemia. " The chronic co-morbidities include multiple cancer history.   * I certify that at the point of admission it is my clinical judgment that the patient will require inpatient hospital care spanning beyond 2 midnights from the point of admission due to high intensity of service, high risk for further deterioration and high frequency of surveillance required.*  Lovenox, full code, inpatient, MedSurg  Time spent: 66 minutes  Cydnie Deason, MD  [facility] Direct contact: [phone] --[place]  --[email or web address]; password [email or web address]  7PM-7AM contact night coverage as above  03/11/2018, 3:58 PM

## 2018-03-12 ENCOUNTER — Encounter (HOSPITAL_COMMUNITY): Payer: Self-pay | Admitting: Nephrology

## 2018-03-12 DIAGNOSIS — K029 Dental caries, unspecified: Secondary | ICD-10-CM | POA: Diagnosis present

## 2018-03-12 DIAGNOSIS — D72829 Elevated white blood cell count, unspecified: Secondary | ICD-10-CM | POA: Diagnosis present

## 2018-03-12 DIAGNOSIS — R0989 Other specified symptoms and signs involving the circulatory and respiratory systems: Secondary | ICD-10-CM

## 2018-03-12 HISTORY — DX: Other specified symptoms and signs involving the circulatory and respiratory systems: R09.89

## 2018-03-12 LAB — CBC
HCT: 47.2 % — ABNORMAL HIGH (ref 36.0–46.0)
Hemoglobin: 13.9 g/dL (ref 12.0–15.0)
MCH: 19.7 pg — ABNORMAL LOW (ref 26.0–34.0)
MCHC: 29.4 g/dL — ABNORMAL LOW (ref 30.0–36.0)
MCV: 66.9 fL — AB (ref 78.0–100.0)
PLATELETS: 587 10*3/uL — AB (ref 150–400)
RBC: 7.06 MIL/uL — AB (ref 3.87–5.11)
RDW: 22.1 % — AB (ref 11.5–15.5)
WBC: 26.7 10*3/uL — AB (ref 4.0–10.5)

## 2018-03-12 LAB — PROTIME-INR
INR: 1
Prothrombin Time: 13.1 seconds (ref 11.4–15.2)

## 2018-03-12 LAB — GLUCOSE, CAPILLARY
GLUCOSE-CAPILLARY: 122 mg/dL — AB (ref 70–99)
GLUCOSE-CAPILLARY: 178 mg/dL — AB (ref 70–99)
Glucose-Capillary: 193 mg/dL — ABNORMAL HIGH (ref 70–99)
Glucose-Capillary: 118 mg/dL — ABNORMAL HIGH (ref 70–99)

## 2018-03-12 LAB — URINALYSIS, COMPLETE (UACMP) WITH MICROSCOPIC
Bilirubin Urine: NEGATIVE
HGB URINE DIPSTICK: NEGATIVE
Ketones, ur: NEGATIVE mg/dL
Leukocytes, UA: NEGATIVE
Nitrite: NEGATIVE
PH: 5 (ref 5.0–8.0)
PROTEIN: NEGATIVE mg/dL
SPECIFIC GRAVITY, URINE: 1.014 (ref 1.005–1.030)

## 2018-03-12 LAB — COMPREHENSIVE METABOLIC PANEL
ALK PHOS: 122 U/L (ref 38–126)
ALT: 17 U/L (ref 0–44)
AST: 44 U/L — AB (ref 15–41)
Albumin: 3.2 g/dL — ABNORMAL LOW (ref 3.5–5.0)
Anion gap: 9 (ref 5–15)
BILIRUBIN TOTAL: 1.7 mg/dL — AB (ref 0.3–1.2)
BUN: 21 mg/dL — ABNORMAL HIGH (ref 6–20)
CALCIUM: 8.4 mg/dL — AB (ref 8.9–10.3)
CHLORIDE: 103 mmol/L (ref 98–111)
CO2: 27 mmol/L (ref 22–32)
CREATININE: 0.71 mg/dL (ref 0.44–1.00)
Glucose, Bld: 116 mg/dL — ABNORMAL HIGH (ref 70–99)
Potassium: 5.6 mmol/L — ABNORMAL HIGH (ref 3.5–5.1)
Sodium: 139 mmol/L (ref 135–145)
Total Protein: 6.5 g/dL (ref 6.5–8.1)

## 2018-03-12 MED ORDER — INSULIN ASPART 100 UNIT/ML ~~LOC~~ SOLN: 0.0000 [IU] | Freq: Every day | SUBCUTANEOUS | Status: DC

## 2018-03-12 MED ORDER — INSULIN ASPART 100 UNIT/ML ~~LOC~~ SOLN
0.0000 [IU] | Freq: Three times a day (TID) | SUBCUTANEOUS | Status: DC
  Administered 2018-03-13: 5 [IU] via SUBCUTANEOUS
  Administered 2018-03-13 – 2018-03-14 (×4): 2 [IU] via SUBCUTANEOUS
  Administered 2018-03-14: 3 [IU] via SUBCUTANEOUS
  Administered 2018-03-15: 2 [IU] via SUBCUTANEOUS
  Administered 2018-03-15 – 2018-03-16 (×3): 3 [IU] via SUBCUTANEOUS

## 2018-03-12 NOTE — Progress Notes (Signed)
[facility] Progress Note  Subjective: no new c/o.   Vitals:   03/11/18 2050 03/12/18 0503 03/12/18 0943 03/12/18 1349  BP: (!) 142/63 (!) 152/60 (!) 164/74 (!) 148/67  Pulse: 77 73 70 64  Resp: '17 20  18  '$ Temp: 98.7 F (37.1 C) 97.9 F (36.6 C) 98.4 F (36.9 C) 98.1 F (36.7 C)  TempSrc: Oral Oral Oral Oral  SpO2: 94% 94% 96% 92%  Weight:      Height:        Inpatient medications: . amLODipine  5 mg Oral Daily  . DULoxetine  120 mg Oral Daily  . enoxaparin (LOVENOX) injection  50 mg Subcutaneous Q24H  . hydrALAZINE  50 mg Oral Q8H  . [START ON 03/13/2018] insulin aspart  0-15 Units Subcutaneous TID WC  . insulin aspart  0-5 Units Subcutaneous QHS  . valACYclovir  1,000 mg Oral BID   . sodium chloride 100 mL/hr at 03/12/18 1400   gabapentin, oxyCODONE-acetaminophen **AND** oxyCODONE, tiZANidine  Exam: Chronically ill appearing, pleasant , no disterss, nasal O2 eomi ncat no pallor-epicanthic folds Chest w/ rales L base, no bronchial BS and no wheezing, occ rhonchi Cor reg no MRG abd soft nt nd no rebound no gaurding no rebound No le edema , no ulcers or leg wounds Neurologically intact nonfocal exam able to ambulate to the restroom and back   coherent power 5/5 grossly sensory intact smile symmetric Very poor dentiton with caries at the #31 vs #32   Presentation Summary: 57 year old female metastatic melanoma BRAF negative, right lung resection 2014, Jak 2+ P vera for cell CA excision, lumpectomy of bilateral breast Other comorbid's include HTN, neuropathy, DM TY 2 poor control, depression/bipolar  Patient was given Rocephin 8/26 for a "very bad" dental infection(s) and po clindamycin 3 times daily and told to follow-up. She completed the clinda course about 10 days pta.  She states she then developed a vaginal yeast infection and was treated by a PA at her PCP's office w/ ond dose of diflucan about 7d ago. Comes to the emergency room on the advice of her PA  will obtain basic labs done are noted elevation of white count and platelet count with predominant neutrophilia and increase in all cell lines.  In the ED patient underwent body CT of abdomen pelvis, chest , face/ neck and head. They all showed no acute abnormality diverticulosis of neck showed no enlargement and showed no intracranial findings except for dental caries  ED Course: Patient was started on cefepime vancomycin given sodium bicarb as well as. Given Kayexalate for renal issues, given a bolus of fluids and was admitted.         Hospital Problems/ Course:  Leukocytosis/ rule out sepsis: pt had recent course po abx for dental caries and associated oral infection, then had diflucan x 1 and monostat OTC for subsequent vaginitis.  Now presents w/ malaise, URI symtpoms, sneezing, etc and ^^ WBC 23k.  Admitted , started on IV abx.  Full body CT and CXR w/o any abscess or sig findings.  WBC down some today.  BLood cx's pending.  She may have a viral URI / acute illness given all the recent URI symptoms.  Will keep another 24-48hrs and repeat CBC in am, get viral PCR nasal swab and await results of BCx's. UA x 2 were neg and kidneys normal on CT. Pt does not appear toxic, hopefully will cont to improve. Cont IV abx for now.  Hypertension: holding Dyazide, acei -  added hydralazine here to home norvasc - BP's are ok  Chronic resp failure: on home O2 nasal cannula  Polycythemia vera : Jak 2+-treated intermittently with phlebotomy  History of multiple other cancers - received multiple CTs here, nothing significatn resulted  AKI / hyperkalemia: due to combination of AKI / acei and nsaids.  Creat is normal but BUN high c/w dehydration.  Will cont IVF"s, BUN and K+ coming down.  ACEi on hold.  Diabetes mellitus type 2: - holding home Jardiance and metformin  - giving sliding scale coverage , will ^ scale as BS's high 100's  Bipolar-continue Cymbalta   Code Status: full DVT Prophylaxis:  lovenox Family Communication: none here Disposition Plan: home when better Status: inpatient   Kelly Splinter MD [facility] pgr [phone] 03/12/2018, 5:30 PM   Recent Labs  Lab 03/10/18 1220 03/11/18 1209 03/12/18 0437  NA 141 140 139  K 6.6* 5.5* 5.6*  CL 100 105 103  CO2 '25 25 27  '$ GLUCOSE 118* 155* 116*  BUN 43* 32* 21*  CREATININE 0.85 0.79 0.71  CALCIUM 9.8 9.0 8.4*   Recent Labs  Lab 03/10/18 1220 03/12/18 0437  AST 11 44*  ALT 14 17  ALKPHOS  --  122  BILITOT 0.3 1.7*  PROT 7.3 6.5  ALBUMIN  --  3.2*   Recent Labs  Lab 03/10/18 1220 03/11/18 1209 03/12/18 0437  WBC 32.0* 32.7* 26.7*  NEUTROABS 26,688*  --   --   HGB 15.1 14.6 13.9  HCT 51.8* 47.7* 47.2*  MCV 65.0* 66.6* 66.9*  PLT 793* 649* 587*   Iron/TIBC/Ferritin/ %Sat    Component Value Date/Time   FERRITIN 17 10/01/2014 1231

## 2018-03-13 LAB — CBC
HEMATOCRIT: 47.3 % — AB (ref 36.0–46.0)
HEMOGLOBIN: 13.7 g/dL (ref 12.0–15.0)
MCH: 19.6 pg — ABNORMAL LOW (ref 26.0–34.0)
MCHC: 29 g/dL — ABNORMAL LOW (ref 30.0–36.0)
MCV: 67.7 fL — AB (ref 78.0–100.0)
Platelets: 579 10*3/uL — ABNORMAL HIGH (ref 150–400)
RBC: 6.99 MIL/uL — AB (ref 3.87–5.11)
RDW: 22.1 % — ABNORMAL HIGH (ref 11.5–15.5)
WBC: 29.9 10*3/uL — ABNORMAL HIGH (ref 4.0–10.5)

## 2018-03-13 LAB — RESPIRATORY PANEL BY PCR
ADENOVIRUS-RVPPCR: NOT DETECTED
BORDETELLA PERTUSSIS-RVPCR: NOT DETECTED
CHLAMYDOPHILA PNEUMONIAE-RVPPCR: NOT DETECTED
Coronavirus 229E: NOT DETECTED
Coronavirus HKU1: NOT DETECTED
Coronavirus NL63: NOT DETECTED
Coronavirus OC43: NOT DETECTED
INFLUENZA B-RVPPCR: NOT DETECTED
Influenza A: NOT DETECTED
MYCOPLASMA PNEUMONIAE-RVPPCR: NOT DETECTED
Metapneumovirus: NOT DETECTED
Parainfluenza Virus 1: NOT DETECTED
Parainfluenza Virus 2: NOT DETECTED
Parainfluenza Virus 3: NOT DETECTED
Parainfluenza Virus 4: NOT DETECTED
RESPIRATORY SYNCYTIAL VIRUS-RVPPCR: NOT DETECTED
Rhinovirus / Enterovirus: NOT DETECTED

## 2018-03-13 LAB — GLUCOSE, CAPILLARY
GLUCOSE-CAPILLARY: 144 mg/dL — AB (ref 70–99)
Glucose-Capillary: 202 mg/dL — ABNORMAL HIGH (ref 70–99)
Glucose-Capillary: 128 mg/dL — ABNORMAL HIGH (ref 70–99)
Glucose-Capillary: 139 mg/dL — ABNORMAL HIGH (ref 70–99)

## 2018-03-13 LAB — BASIC METABOLIC PANEL
Anion gap: 9 (ref 5–15)
BUN: 18 mg/dL (ref 6–20)
CO2: 30 mmol/L (ref 22–32)
Calcium: 9.3 mg/dL (ref 8.9–10.3)
Chloride: 103 mmol/L (ref 98–111)
Creatinine, Ser: 0.66 mg/dL (ref 0.44–1.00)
Glucose, Bld: 127 mg/dL — ABNORMAL HIGH (ref 70–99)
Potassium: 4.8 mmol/L (ref 3.5–5.1)
Sodium: 142 mmol/L (ref 135–145)

## 2018-03-13 MED ORDER — VANCOMYCIN HCL 10 G IV SOLR
2000.0000 mg | Freq: Once | INTRAVENOUS | Status: AC
  Administered 2018-03-13: 2000 mg via INTRAVENOUS
  Filled 2018-03-13: qty 2000

## 2018-03-13 MED ORDER — SODIUM CHLORIDE 0.9 % IV SOLN
2.0000 g | Freq: Three times a day (TID) | INTRAVENOUS | Status: DC
  Administered 2018-03-13 – 2018-03-16 (×9): 2 g via INTRAVENOUS
  Filled 2018-03-13 (×11): qty 2

## 2018-03-13 MED ORDER — VANCOMYCIN HCL IN DEXTROSE 1-5 GM/200ML-% IV SOLN
1000.0000 mg | Freq: Two times a day (BID) | INTRAVENOUS | Status: DC
  Administered 2018-03-13 – 2018-03-15 (×4): 1000 mg via INTRAVENOUS
  Filled 2018-03-13 (×4): qty 200

## 2018-03-13 NOTE — Progress Notes (Signed)
[facility]                                                                              Patient Demographics  Katelyn Cline, is a 57 y.o. female, DOB - [birth year], KGM:[id number]  Admit date - 03/11/2018   Admitting Physician Nita Sells, MD  Outpatient Primary MD for the patient is [facility], Modena Nunnery, MD  Outpatient specialists:   LOS - 2  days   Medical records reviewed and are as summarized below:    Chief Complaint  Patient presents with  . Abnormal Lab  . Weakness       Brief summary   Patient is a 57 year old female with history of metastatic melanoma, right lung resection, polycythemia vera, lumpectomy of the bilateral breast, hypertension, neuropathy, diabetes type 2 presented to ED advised by her PCP  due to abnormal labs and weakness.  Patient had been treated with clindamycin for severe dental infection, in ED, was noted to have leukocytosis, thrombocytosis with predominant neutrophilia and increase in all cell lines. patient was admitted for further work-up.  Assessment & Plan    Principal Problem:   Leukocytosis -Rule out sepsis, patient had a recent course of antibiotics for dental infection, then Diflucan x1 for subsequent vaginitis -Presented with a leukocytosis of 32 with neutrophilia -CT soft tissue neck, chest and abdomen and pelvis all negative. CT maxillofacial showed dental caries otherwise unremarkable -Respiratory virus panel negative, blood cultures negative so far -Continue IV vancomycin and cefepime today, if blood cultures remained negative tomorrow, will DC antibiotics and likely DC home  Active Problems:    Tobacco use disorder -Patient counseled on smoking cessation    Essential hypertension, benign -BP stable, continue hydralazine, Norvasc    Polycythemia vera ([name]) - JAK 2+ treated intermittently with phlebotomy    Diabetes mellitus type II, controlled ([place]) -Continue sliding scale insulin, hold outpatient  metformin and Jardiance    Chronic hypoxemic respiratory failure ([name]) -Continue O2 via nasal cannula  Acute kidney injury/hyperkalemia Likely due to combination of AKI with ACE inhibitors and NSAID -Continue gentle hydration  Code Status: Full CODE STATUS DVT Prophylaxis:  Lovenox  Family Communication: Discussed in detail with the patient, all imaging results, lab results explained to the patient    Disposition Plan:   Time Spent in minutes  35  Procedures:  2D echo Consultants:   Cardiology  Antimicrobials:      Medications  Scheduled Meds: . amLODipine  5 mg Oral Daily  . DULoxetine  120 mg Oral Daily  . enoxaparin (LOVENOX) injection  50 mg Subcutaneous Q24H  . hydrALAZINE  50 mg Oral Q8H  . insulin aspart  0-15 Units Subcutaneous TID WC  . insulin aspart  0-5 Units Subcutaneous QHS  . valACYclovir  1,000 mg Oral BID   Continuous Infusions: . sodium chloride 75 mL/hr at 03/13/18 0207  . ceFEPime (MAXIPIME) IV 2 g (03/13/18 1027)  . vancomycin 2,000 mg (03/13/18 1213)  . vancomycin     PRN Meds:.gabapentin, oxyCODONE-acetaminophen **AND** oxyCODONE, tiZANidine   Antibiotics   Anti-infectives (From admission, onward)   Start     Dose/Rate Route Frequency Ordered  Stop   03/13/18 2200  vancomycin (VANCOCIN) IVPB 1000 mg/200 mL premix     1,000 mg 200 mL/hr over 60 Minutes Intravenous Every 12 hours 03/13/18 0924     03/13/18 1000  ceFEPIme (MAXIPIME) 2 g in sodium chloride 0.9 % 100 mL IVPB     2 g 200 mL/hr over 30 Minutes Intravenous Every 8 hours 03/13/18 0924     03/13/18 1000  vancomycin (VANCOCIN) 2,000 mg in sodium chloride 0.9 % 500 mL IVPB     2,000 mg 250 mL/hr over 120 Minutes Intravenous  Once 03/13/18 0924     03/11/18 2200  valACYclovir (VALTREX) tablet 1,000 mg     1,000 mg Oral 2 times daily 03/11/18 1802     03/11/18 1415  vancomycin (VANCOCIN) IVPB 1000 mg/200 mL premix     1,000 mg 200 mL/hr over 60 Minutes Intravenous  Once  03/11/18 1413 03/11/18 1646   03/11/18 1415  ceFEPIme (MAXIPIME) 2 g in sodium chloride 0.9 % 100 mL IVPB     2 g 200 mL/hr over 30 Minutes Intravenous  Once 03/11/18 1413 03/11/18 1507        Subjective:   Katelyn Cline was seen and examined today.  No fevers this morning however still does not feel too good, just feels congested, nasal stuffiness. Patient denies dizziness, chest pain, shortness of breath, abdominal pain, N/V/D/C, new weakness, numbess, tingling. No acute events overnight.    Objective:   Vitals:   03/12/18 1349 03/12/18 2214 03/13/18 0502 03/13/18 1017  BP: (!) 148/67 134/60 (!) 155/65 (!) 155/65  Pulse: 64 60 65   Resp: 18 14 16    Temp: 98.1 F (36.7 C) 98.2 F (36.8 C) 98.3 F (36.8 C)   TempSrc: Oral Oral Oral   SpO2: 92% 96% 97%   Weight:      Height:       No intake or output data in the 24 hours ending 03/13/18 1345   Wt Readings from Last 3 Encounters:  03/11/18 103.5 kg  03/10/18 103.9 kg  02/21/18 105.2 kg     Exam  General: Alert and oriented x 3, NAD  Eyes:   HEENT:  Atraumatic, normocephalic  Cardiovascular: S1 S2 auscultated. Regular rate and rhythm.  Respiratory: Decreased breath sound at the bases  Gastrointestinal: Soft, nontender, nondistended, + bowel sounds  Ext: no pedal edema bilaterally  Neuro: no new deficits  Musculoskeletal: No digital cyanosis, clubbing  Skin: No rashes  Psych: Normal affect and demeanor, alert and oriented x3    Data Reviewed:  I have personally reviewed following labs and imaging studies  Micro Results Recent Results (from the past 240 hour(s))  WET PREP FOR TRICH, YEAST, CLUE     Status: None   Collection Time: 03/10/18 11:32 AM  Result Value Ref Range Status   Source: GENITAL  Final   RESULT   Final    Comment: TRICHOMONAS-NONE SEEN YEAST-NONE SEEN CLUE CELLS-NONE SEEN EPITHELIAL CELLS-PRESENT BACTERIA-MODERATE WBCS-FEW   Urine Culture     Status: None   Collection Time:  03/10/18 12:01 PM  Result Value Ref Range Status   MICRO NUMBER: [id number]  Final   SPECIMEN QUALITY: ADEQUATE  Final   Sample Source URINE  Final   STATUS: FINAL  Final   Result:   Final    Multiple organisms present, each less than 10,000 CFU/mL. These organisms, commonly found on external and internal genitalia, are considered to be colonizers. No further testing performed.  Blood  culture (routine x 2)     Status: None (Preliminary result)   Collection Time: 03/11/18 12:25 PM  Result Value Ref Range Status   Specimen Description   Final    BLOOD RIGHT ARM Performed at [facility] [address] [place], [place] [address]    Special Requests   Final    BOTTLES DRAWN AEROBIC AND ANAEROBIC Blood Culture adequate volume Performed at [facility] [address] [place], [place] [address]    Culture   Final    NO GROWTH 1 DAY Performed at [facility], [place] [address] [place], [place] [address]    Report Status PENDING  Incomplete  Blood culture (routine x 2)     Status: None (Preliminary result)   Collection Time: 03/11/18 12:34 PM  Result Value Ref Range Status   Specimen Description   Final    BLOOD BLOOD LEFT FOREARM Performed at [facility] [address], [place], [place] [address]    Special Requests   Final    BOTTLES DRAWN AEROBIC AND ANAEROBIC Blood Culture results may not be optimal due to an excessive volume of blood received in culture bottles Performed at [facility] [address], [place], [place] [address]    Culture   Final    NO GROWTH 1 DAY Performed at [facility], [place] [address], [place], [place] [address]    Report Status PENDING  Incomplete  Respiratory Panel by PCR     Status: None   Collection Time: 03/12/18  6:03 PM  Result Value Ref Range Status   Adenovirus NOT DETECTED NOT DETECTED Final   Coronavirus 229E NOT DETECTED NOT DETECTED Final    Coronavirus HKU1 NOT DETECTED NOT DETECTED Final   Coronavirus NL63 NOT DETECTED NOT DETECTED Final   Coronavirus OC43 NOT DETECTED NOT DETECTED Final   Metapneumovirus NOT DETECTED NOT DETECTED Final   Rhinovirus / Enterovirus NOT DETECTED NOT DETECTED Final   Influenza A NOT DETECTED NOT DETECTED Final   Influenza B NOT DETECTED NOT DETECTED Final   Parainfluenza Virus 1 NOT DETECTED NOT DETECTED Final   Parainfluenza Virus 2 NOT DETECTED NOT DETECTED Final   Parainfluenza Virus 3 NOT DETECTED NOT DETECTED Final   Parainfluenza Virus 4 NOT DETECTED NOT DETECTED Final   Respiratory Syncytial Virus NOT DETECTED NOT DETECTED Final   Bordetella pertussis NOT DETECTED NOT DETECTED Final   Chlamydophila pneumoniae NOT DETECTED NOT DETECTED Final   Mycoplasma pneumoniae NOT DETECTED NOT DETECTED Final    Comment: Performed at [facility], [place] [address], [place],  [address]    Radiology Reports Ct Abdomen Pelvis Wo Contrast  Result Date: 03/11/2018 CLINICAL DATA:  Leukocytosis. Elevated potassium and BUN. Weakness for the past 2 weeks. Metastatic melanoma. EXAM: CT CHEST, ABDOMEN AND PELVIS WITHOUT CONTRAST TECHNIQUE: Multidetector CT imaging of the chest, abdomen and pelvis was performed following the standard protocol without IV contrast. COMPARISON:  09/21/2017. FINDINGS: CT CHEST FINDINGS Cardiovascular: Atheromatous calcifications, including the coronary arteries and aorta. Mediastinum/Nodes: No enlarged mediastinal, hilar, or axillary lymph nodes. Thyroid gland, trachea, and esophagus demonstrate no significant findings. Lungs/Pleura: 7 mm left lower lobe nodule on image number 112 series 7, 8 mm previously. 5 mm left upper lobe nodule on image number 62 series 7, previously 5 mm. 6 mm subpleural nodule in the right middle lobe along the minor fissure on image number 76 series 7, previously 4 mm. This  remains thin and plaque-like in the sagittal plane, unchanged in size in the  sagittal plane. The previously demonstrated 3 mm right upper lobe nodule on the major fissure measures 5 mm on image number 64 series 7 today, also unchanged in the sagittal plane. No new nodules are seen. Musculoskeletal: Mild thoracic and lower cervical spine degenerative changes. CT ABDOMEN PELVIS FINDINGS Hepatobiliary: The liver remains mildly enlarged. No focal liver abnormality is seen. No gallstones, gallbladder wall thickening, or biliary dilatation. Pancreas: Unremarkable. No pancreatic ductal dilatation or surrounding inflammatory changes. Spleen: Normal in size without focal abnormality. Adrenals/Urinary Tract: The previously suspected small right renal cysts measures fluid density today. Otherwise, normal appearing adrenal glands, kidneys, ureters and urinary bladder. Stomach/Bowel: Multiple sigmoid colon diverticula. Mildly prominent stool. Normal appearing stomach, small bowel and appendix. Vascular/Lymphatic: Atheromatous arterial calcifications, including left renal artery branch calcifications peripherally. No enlarged lymph nodes. Reproductive: Status post hysterectomy. No adnexal masses. Other: Small umbilical hernia containing fat. Musculoskeletal: Mild lumbar spine degenerative changes. IMPRESSION: 1. No acute abnormality. 2. Stable small bilateral lung nodules, as described above. 3. Stable hepatomegaly. 4. Sigmoid diverticulosis. 5. Atheromatous arterial calcifications, including the coronary arteries and aorta. Electronically Signed   By: Claudie Revering M.D.   On: 03/11/2018 15:42   Dg Chest 2 View  Result Date: 03/11/2018 CLINICAL DATA:  Weakness 2 weeks with elevated white blood cell count. Near syncope. EXAM: CHEST - 2 VIEW COMPARISON:  10/15/2013 FINDINGS: Lungs are adequately inflated without focal airspace consolidation or effusion. Linear scarring over the right upper lung unchanged. Subtle prominence of the perihilar markings which may be due to mild degree of vascular congestion  versus viral bronchopneumonia. Cardiomediastinal silhouette and remainder the exam is unchanged. IMPRESSION: Subtle prominence of the perihilar markings which may be due to mild vascular congestion versus viral bronchopneumonia. Electronically Signed   By: Marin Olp M.D.   On: 03/11/2018 13:28   Ct Head Wo Contrast  Result Date: 03/11/2018 CLINICAL DATA:  Near syncopal episodes. EXAM: CT HEAD WITHOUT CONTRAST CT MAXILLOFACIAL WITHOUT CONTRAST TECHNIQUE: Multidetector CT imaging of the head and maxillofacial structures were performed using the standard protocol without intravenous contrast. Multiplanar CT image reconstructions of the maxillofacial structures were also generated. COMPARISON:  Brain MRI 09/19/2012 FINDINGS: CT HEAD FINDINGS Brain: No evidence of acute infarction, hemorrhage, hydrocephalus, extra-axial collection or mass lesion/mass effect. Vascular: No hyperdense vessel or unexpected calcification. Skull: Normal. Negative for fracture or focal lesion. Other: No scalp lesions or hematoma. CT MAXILLOFACIAL FINDINGS Osseous: No acute facial bone fractures or worrisome bone lesions. Orbits: No fracture. The globes are intact. No orbital mass. The extraocular muscles appear normal. Sinuses: Mild mucoperiosteal thickening involving scattered ethmoid sinuses. There is also a small mucous retention cyst or polyp in the floor of the left maxillary sinus. No findings for acute sinusitis. The mastoid air cells and middle ear cavities are clear. Soft tissues: No soft tissue mass or hematoma. Other: Fairly extensive dental caries are noted. IMPRESSION: 1. No acute intracranial findings or skull fracture. 2. No acute facial bone fractures or bone lesions. 3. No significant sinus disease. 4. Dental caries. Electronically Signed   By: Marijo Sanes M.D.   On: 03/11/2018 15:36   Ct Soft Tissue Neck Wo Contrast  Result Date: 03/11/2018 CLINICAL DATA:  Swelling of the neck.  Suspicion of thyroiditis. EXAM: CT  NECK WITHOUT CONTRAST TECHNIQUE: Multidetector CT imaging of the neck was performed following the standard protocol without intravenous contrast. COMPARISON:  None. FINDINGS: Pharynx  and larynx: No mucosal or submucosal lesion. Salivary glands: Parotid and submandibular glands are unremarkable. Thyroid: The thyroid gland does not appear enlarged or show any focal lesion. Lymph nodes: No adenopathy. Vascular: Ordinary atherosclerosis at the carotid bifurcations. Otherwise negative. No contrast utilized. Limited intracranial: See results of head CT. Visualized orbits: See results of facial CT. Mastoids and visualized paranasal sinuses: See results of facial CT. Skeleton: Ordinary cervical spondylosis and facet arthritis. Upper chest: Negative Other: None IMPRESSION: The thyroid gland does not appear enlarged or show any focal finding. This is a noncontrast study. No other neck pathology of clinical relevance. Electronically Signed   By: Nelson Chimes M.D.   On: 03/11/2018 15:34   Ct Chest Wo Contrast  Result Date: 03/11/2018 CLINICAL DATA:  Leukocytosis. Elevated potassium and BUN. Weakness for the past 2 weeks. Metastatic melanoma. EXAM: CT CHEST, ABDOMEN AND PELVIS WITHOUT CONTRAST TECHNIQUE: Multidetector CT imaging of the chest, abdomen and pelvis was performed following the standard protocol without IV contrast. COMPARISON:  09/21/2017. FINDINGS: CT CHEST FINDINGS Cardiovascular: Atheromatous calcifications, including the coronary arteries and aorta. Mediastinum/Nodes: No enlarged mediastinal, hilar, or axillary lymph nodes. Thyroid gland, trachea, and esophagus demonstrate no significant findings. Lungs/Pleura: 7 mm left lower lobe nodule on image number 112 series 7, 8 mm previously. 5 mm left upper lobe nodule on image number 62 series 7, previously 5 mm. 6 mm subpleural nodule in the right middle lobe along the minor fissure on image number 76 series 7, previously 4 mm. This remains thin and plaque-like in  the sagittal plane, unchanged in size in the sagittal plane. The previously demonstrated 3 mm right upper lobe nodule on the major fissure measures 5 mm on image number 64 series 7 today, also unchanged in the sagittal plane. No new nodules are seen. Musculoskeletal: Mild thoracic and lower cervical spine degenerative changes. CT ABDOMEN PELVIS FINDINGS Hepatobiliary: The liver remains mildly enlarged. No focal liver abnormality is seen. No gallstones, gallbladder wall thickening, or biliary dilatation. Pancreas: Unremarkable. No pancreatic ductal dilatation or surrounding inflammatory changes. Spleen: Normal in size without focal abnormality. Adrenals/Urinary Tract: The previously suspected small right renal cysts measures fluid density today. Otherwise, normal appearing adrenal glands, kidneys, ureters and urinary bladder. Stomach/Bowel: Multiple sigmoid colon diverticula. Mildly prominent stool. Normal appearing stomach, small bowel and appendix. Vascular/Lymphatic: Atheromatous arterial calcifications, including left renal artery branch calcifications peripherally. No enlarged lymph nodes. Reproductive: Status post hysterectomy. No adnexal masses. Other: Small umbilical hernia containing fat. Musculoskeletal: Mild lumbar spine degenerative changes. IMPRESSION: 1. No acute abnormality. 2. Stable small bilateral lung nodules, as described above. 3. Stable hepatomegaly. 4. Sigmoid diverticulosis. 5. Atheromatous arterial calcifications, including the coronary arteries and aorta. Electronically Signed   By: Claudie Revering M.D.   On: 03/11/2018 15:42   Ct Maxillofacial Wo Contrast  Result Date: 03/11/2018 CLINICAL DATA:  Near syncopal episodes. EXAM: CT HEAD WITHOUT CONTRAST CT MAXILLOFACIAL WITHOUT CONTRAST TECHNIQUE: Multidetector CT imaging of the head and maxillofacial structures were performed using the standard protocol without intravenous contrast. Multiplanar CT image reconstructions of the maxillofacial  structures were also generated. COMPARISON:  Brain MRI 09/19/2012 FINDINGS: CT HEAD FINDINGS Brain: No evidence of acute infarction, hemorrhage, hydrocephalus, extra-axial collection or mass lesion/mass effect. Vascular: No hyperdense vessel or unexpected calcification. Skull: Normal. Negative for fracture or focal lesion. Other: No scalp lesions or hematoma. CT MAXILLOFACIAL FINDINGS Osseous: No acute facial bone fractures or worrisome bone lesions. Orbits: No fracture. The globes are intact. No orbital mass. The  extraocular muscles appear normal. Sinuses: Mild mucoperiosteal thickening involving scattered ethmoid sinuses. There is also a small mucous retention cyst or polyp in the floor of the left maxillary sinus. No findings for acute sinusitis. The mastoid air cells and middle ear cavities are clear. Soft tissues: No soft tissue mass or hematoma. Other: Fairly extensive dental caries are noted. IMPRESSION: 1. No acute intracranial findings or skull fracture. 2. No acute facial bone fractures or bone lesions. 3. No significant sinus disease. 4. Dental caries. Electronically Signed   By: Marijo Sanes M.D.   On: 03/11/2018 15:36    Lab Data:  CBC: Recent Labs  Lab 03/10/18 1220 03/11/18 1209 03/12/18 0437 03/13/18 0452  WBC 32.0* 32.7* 26.7* 29.9*  NEUTROABS 26,688*  --   --   --   HGB 15.1 14.6 13.9 13.7  HCT 51.8* 47.7* 47.2* 47.3*  MCV 65.0* 66.6* 66.9* 67.7*  PLT 793* 649* 587* 532*   Basic Metabolic Panel: Recent Labs  Lab 03/10/18 1220 03/11/18 1209 03/12/18 0437 03/13/18 0452  NA 141 140 139 142  K 6.6* 5.5* 5.6* 4.8  CL 100 105 103 103  CO2 25 25 27 30   GLUCOSE 118* 155* 116* 127*  BUN 43* 32* 21* 18  CREATININE 0.85 0.79 0.71 0.66  CALCIUM 9.8 9.0 8.4* 9.3   GFR: Estimated Creatinine Clearance: 95.3 mL/min (by C-G formula based on SCr of 0.66 mg/dL). Liver Function Tests: Recent Labs  Lab 03/10/18 1220 03/12/18 0437  AST 11 44*  ALT 14 17  ALKPHOS  --  122    BILITOT 0.3 1.7*  PROT 7.3 6.5  ALBUMIN  --  3.2*   No results for input(s): LIPASE, AMYLASE in the last 168 hours. No results for input(s): AMMONIA in the last 168 hours. Coagulation Profile: Recent Labs  Lab 03/12/18 0437  INR 1.00   Cardiac Enzymes: No results for input(s): CKTOTAL, CKMB, CKMBINDEX, TROPONINI in the last 168 hours. BNP (last 3 results) No results for input(s): PROBNP in the last 8760 hours. HbA1C: No results for input(s): HGBA1C in the last 72 hours. CBG: Recent Labs  Lab 03/12/18 1120 03/12/18 1734 03/12/18 2107 03/13/18 0842 03/13/18 1139  GLUCAP 193* 118* 178* 202* 128*   Lipid Profile: No results for input(s): CHOL, HDL, LDLCALC, TRIG, CHOLHDL, LDLDIRECT in the last 72 hours. Thyroid Function Tests: No results for input(s): TSH, T4TOTAL, FREET4, T3FREE, THYROIDAB in the last 72 hours. Anemia Panel: No results for input(s): VITAMINB12, FOLATE, FERRITIN, TIBC, IRON, RETICCTPCT in the last 72 hours. Urine analysis:    Component Value Date/Time   COLORURINE STRAW (A) 03/12/2018 0142   APPEARANCEUR CLEAR 03/12/2018 0142   LABSPEC 1.014 03/12/2018 0142   PHURINE 5.0 03/12/2018 0142   GLUCOSEU >=500 (A) 03/12/2018 0142   HGBUR NEGATIVE 03/12/2018 0142   BILIRUBINUR NEGATIVE 03/12/2018 0142   KETONESUR NEGATIVE 03/12/2018 0142   PROTEINUR NEGATIVE 03/12/2018 0142   UROBILINOGEN 0.2 02/14/2014 1023   NITRITE NEGATIVE 03/12/2018 0142   LEUKOCYTESUR NEGATIVE 03/12/2018 0142     Cassity Christian M.D. [facility] 03/13/2018, 1:45 PM  Pager: [phone] Between 7am to 7pm - call Pager - [phone]  After 7pm go to [email or web address] - password [email or web address]  Call night coverage person covering after 7pm

## 2018-03-13 NOTE — Progress Notes (Signed)
Pharmacy Antibiotic Note  Katelyn Cline is a 57 y.o. female admitted on 03/11/2018 with malaise and URI symptoms. Recently on PO antibiotics for dental caries. WBCs found to be elevated. 9/13 received single-doses of Cefepime and Vancomycin in the ED for possible sepsis. 9/15 pharmacy has been consulted for Cefepime and Vancomycin dosing.  Plan: Cefepime 2g IV q8h. Vancomycin 2g IV x 1 then 1g IV q12h for estimated AUC 482.  Measure Vanc peak and trough at steady state. Goal AUC = 400 - 500 for all indications, except meningitis (goal AUC > 500 and Cmin 15-20 mcg/mL) Follow up renal function, culture results, and clinical course.   Height: 5\' 8"  (172.7 cm) Weight: 228 lb 2 oz (103.5 kg) IBW/kg (Calculated) : 63.9  Temp (24hrs), Avg:98.3 F (36.8 C), Min:98.1 F (36.7 C), Max:98.4 F (36.9 C)  Recent Labs  Lab 03/10/18 1220 03/11/18 1209 03/11/18 1239 03/11/18 1532 03/12/18 0437 03/13/18 0452  WBC 32.0* 32.7*  --   --  26.7* 29.9*  CREATININE 0.85 0.79  --   --  0.71 0.66  LATICACIDVEN  --   --  2.05* 1.76  --   --     Estimated Creatinine Clearance: 95.3 mL/min (by C-G formula based on SCr of 0.66 mg/dL).    Allergies  Allergen Reactions  . Contrast Media [Iodinated Diagnostic Agents] Anaphylaxis  . Erythromycin   . Flagyl [Metronidazole] Other (See Comments)    Generalized pain.  . Latex Other (See Comments)    Was told to be careful because of Dye allergy  . Other     Iodine Dye  . Penicillins Other (See Comments)    Patient was an infant, no idea of reaction. Tolerates Keflex.  . Tetracyclines & Related Other (See Comments)    GI side effects    Antimicrobials this admission: 9/13 Cefepime x 1, 9/15 >>  9/13 Vanc x 1, 9/15 >>  Dose adjustments this admission:   Microbiology results: 9/13 BCx: ngtd 9/13 UCx: sent 9/14 MRSA PCR: negative  Thank you for allowing pharmacy to be a part of this patient's care.  Romeo Rabon, PharmD. Mobile:  [phone]. 03/13/2018,9:35 AM.

## 2018-03-14 DIAGNOSIS — Z888 Allergy status to other drugs, medicaments and biological substances status: Secondary | ICD-10-CM

## 2018-03-14 DIAGNOSIS — Z85118 Personal history of other malignant neoplasm of bronchus and lung: Secondary | ICD-10-CM

## 2018-03-14 DIAGNOSIS — Z85820 Personal history of malignant melanoma of skin: Secondary | ICD-10-CM

## 2018-03-14 DIAGNOSIS — Z880 Allergy status to penicillin: Secondary | ICD-10-CM

## 2018-03-14 DIAGNOSIS — Z881 Allergy status to other antibiotic agents status: Secondary | ICD-10-CM

## 2018-03-14 DIAGNOSIS — Z91041 Radiographic dye allergy status: Secondary | ICD-10-CM

## 2018-03-14 DIAGNOSIS — Z91040 Latex allergy status: Secondary | ICD-10-CM

## 2018-03-14 DIAGNOSIS — D473 Essential (hemorrhagic) thrombocythemia: Secondary | ICD-10-CM

## 2018-03-14 DIAGNOSIS — F17210 Nicotine dependence, cigarettes, uncomplicated: Secondary | ICD-10-CM

## 2018-03-14 DIAGNOSIS — D471 Chronic myeloproliferative disease: Secondary | ICD-10-CM

## 2018-03-14 LAB — BASIC METABOLIC PANEL
Anion gap: 11 (ref 5–15)
BUN: 15 mg/dL (ref 6–20)
CALCIUM: 9.4 mg/dL (ref 8.9–10.3)
CO2: 26 mmol/L (ref 22–32)
CREATININE: 0.56 mg/dL (ref 0.44–1.00)
Chloride: 108 mmol/L (ref 98–111)
GFR calc Af Amer: >60 mL/min (ref >60)
GFR calc non Af Amer: >60 mL/min (ref >60)
Glucose, Bld: 138 mg/dL — ABNORMAL HIGH (ref 70–99)
POTASSIUM: 4.6 mmol/L (ref 3.5–5.1)
Sodium: 145 mmol/L (ref 135–145)

## 2018-03-14 LAB — GLUCOSE, CAPILLARY
GLUCOSE-CAPILLARY: 138 mg/dL — AB (ref 70–99)
GLUCOSE-CAPILLARY: 167 mg/dL — AB (ref 70–99)
Glucose-Capillary: 149 mg/dL — ABNORMAL HIGH (ref 70–99)
Glucose-Capillary: 191 mg/dL — ABNORMAL HIGH (ref 70–99)

## 2018-03-14 LAB — CBC
HEMATOCRIT: 47.1 % — AB (ref 36.0–46.0)
Hemoglobin: 13.7 g/dL (ref 12.0–15.0)
MCH: 19.7 pg — ABNORMAL LOW (ref 26.0–34.0)
MCHC: 29.1 g/dL — ABNORMAL LOW (ref 30.0–36.0)
MCV: 67.6 fL — ABNORMAL LOW (ref 78.0–100.0)
PLATELETS: 542 10*3/uL — AB (ref 150–400)
RBC: 6.97 MIL/uL — ABNORMAL HIGH (ref 3.87–5.11)
RDW: 22.3 % — AB (ref 11.5–15.5)
WBC: 31 10*3/uL — AB (ref 4.0–10.5)

## 2018-03-14 LAB — PROCALCITONIN: PROCALCITONIN: 0.19 ng/mL

## 2018-03-14 LAB — HERPES SIMPLEX VIRUS CULTURE
MICRO NUMBER:: 91094230
SPECIMEN QUALITY: ADEQUATE

## 2018-03-14 LAB — FERRITIN: Ferritin: 71 ng/mL (ref 11–307)

## 2018-03-14 NOTE — Care Management Important Message (Signed)
Important Message  Patient Details  Name: Katelyn Cline MRN: [id number] Date of Birth: [birth year]   Medicare Important Message Given:  Yes    Kerin Salen 03/14/2018, 10:09 AMImportant Message  Patient Details  Name: Katelyn Cline MRN: [id number] Date of Birth: 11-15-1960   Medicare Important Message Given:  Yes    Kerin Salen 03/14/2018, 10:09 AM

## 2018-03-14 NOTE — Progress Notes (Signed)
[facility]                                                                              Patient Demographics  Katelyn Cline, is a 57 y.o. female, DOB - 1960-09-29, JFH:[id number]  Admit date - 03/11/2018   Admitting Physician Nita Sells, MD  Outpatient Primary MD for the patient is [facility], [place] Nunnery, MD  Outpatient specialists:   LOS - 3  days   Medical records reviewed and are as summarized below:    Chief Complaint  Patient presents with  . Abnormal Lab  . Weakness       Brief summary   Patient is a 57 year old female with history of metastatic melanoma, right lung resection, polycythemia vera, lumpectomy of the bilateral breast, hypertension, neuropathy, diabetes type 2 presented to ED advised by her PCP  due to abnormal labs and weakness.  Patient had been treated with clindamycin for severe dental infection, in ED, was noted to have leukocytosis, thrombocytosis with predominant neutrophilia and increase in all cell lines. patient was admitted for further work-up.  Assessment & Plan    Principal Problem:   Leukocytosis in the setting of polycythemia vera -Ruled out sepsis, patient had a recent course of antibiotics for dental infection, then Diflucan x1 for subsequent vaginitis -Presented with a leukocytosis of 32 with neutrophilia -CT soft tissue neck, chest and abdomen and pelvis all negative. CT maxillofacial showed dental caries otherwise unremarkable -Respiratory virus panel negative, blood cultures negative so far, will check procalcitonin -Work-up so far negative, no fevers, continue IV vancomycin and cefepime.  Leukocytosis however trending up, called hematology oncology for consult, discussed with Dr Irene Limbo.   Active Problems:    Tobacco use disorder -Patient counseled on smoking cessation    Essential hypertension, benign -BP stable, continue Norvasc, hydralazine     Polycythemia vera ([name]) - JAK 2+ treated intermittently with  phlebotomy,  -Await heme-onc recommendations    Diabetes mellitus type II, controlled ([place]) -CBGs fairly stable  -Continue sliding scale insulin  -Continue to hold metformin, Jardiance     Chronic hypoxemic respiratory failure ([name]) -Continue O2 via nasal cannula  Acute kidney injury/hyperkalemia -Resolved, likely due to combination of AKI with ACE inhibitors and NSAID -Continue gentle hydration  Code Status: Full CODE STATUS DVT Prophylaxis:  Lovenox  Family Communication: Discussed in detail with the patient, all imaging results, lab results explained to the patient    Disposition Plan: Awaiting heme oncology recommendation  Time Spent in minutes  35  Procedures:  2D echo Consultants:   Cardiology  Antimicrobials:      Medications  Scheduled Meds: . amLODipine  5 mg Oral Daily  . DULoxetine  120 mg Oral Daily  . enoxaparin (LOVENOX) injection  50 mg Subcutaneous Q24H  . hydrALAZINE  50 mg Oral Q8H  . insulin aspart  0-15 Units Subcutaneous TID WC  . insulin aspart  0-5 Units Subcutaneous QHS  . valACYclovir  1,000 mg Oral BID   Continuous Infusions: . sodium chloride 75 mL/hr at 03/14/18 0804  . ceFEPime (MAXIPIME) IV 2 g (03/14/18 1107)  . vancomycin 1,000 mg (03/14/18 1227)  PRN Meds:.gabapentin, oxyCODONE-acetaminophen **AND** oxyCODONE, tiZANidine   Antibiotics   Anti-infectives (From admission, onward)   Start     Dose/Rate Route Frequency Ordered Stop   03/13/18 2200  vancomycin (VANCOCIN) IVPB 1000 mg/200 mL premix     1,000 mg 200 mL/hr over 60 Minutes Intravenous Every 12 hours 03/13/18 0924     03/13/18 1000  ceFEPIme (MAXIPIME) 2 g in sodium chloride 0.9 % 100 mL IVPB     2 g 200 mL/hr over 30 Minutes Intravenous Every 8 hours 03/13/18 0924     03/13/18 1000  vancomycin (VANCOCIN) 2,000 mg in sodium chloride 0.9 % 500 mL IVPB     2,000 mg 250 mL/hr over 120 Minutes Intravenous  Once 03/13/18 0924 03/13/18 1413   03/11/18 2200   valACYclovir (VALTREX) tablet 1,000 mg     1,000 mg Oral 2 times daily 03/11/18 1802     03/11/18 1415  vancomycin (VANCOCIN) IVPB 1000 mg/200 mL premix     1,000 mg 200 mL/hr over 60 Minutes Intravenous  Once 03/11/18 1413 03/11/18 1646   03/11/18 1415  ceFEPIme (MAXIPIME) 2 g in sodium chloride 0.9 % 100 mL IVPB     2 g 200 mL/hr over 30 Minutes Intravenous  Once 03/11/18 1413 03/11/18 1507        Subjective:   Sharaine Delange was seen and examined today. feels little groggy otherwise no acute complaints.  No fevers overnight.  Patient denies dizziness, chest pain, shortness of breath, abdominal pain, N/V/D/C, new weakness, numbess, tingling. No acute events overnight.    Objective:   Vitals:   03/13/18 2159 03/14/18 0430 03/14/18 1117 03/14/18 1154  BP: (!) 141/64 (!) 142/56 (!) 131/98 (!) 155/68  Pulse: 60 62 70 74  Resp: 18 16 18 16   Temp: 98.8 F (37.1 C) 98.4 F (36.9 C)  98.2 F (36.8 C)  TempSrc: Oral Oral  Oral  SpO2: 97% 98% 98% 95%  Weight:      Height:        Intake/Output Summary (Last 24 hours) at 03/14/2018 1317 Last data filed at 03/14/2018 1100 Gross per 24 hour  Intake 4845.68 ml  Output -  Net 4845.68 ml     Wt Readings from Last 3 Encounters:  03/11/18 103.5 kg  03/10/18 103.9 kg  02/21/18 105.2 kg     Exam    General: Alert and oriented x 3, NAD  Eyes:   HEENT:  Atraumatic, normocephalic  Cardiovascular: S1 S2 auscultated,  RRR. No pedal edema b/l  Respiratory: Clear to auscultation bilaterally, no wheezing, rales or rhonchi  Gastrointestinal: Soft, nontender, nondistended, + bowel sounds  Ext: no pedal edema bilaterally  Neuro: no FND's   Musculoskeletal: No digital cyanosis, clubbing  Skin: No rashes  Psych: Normal affect and demeanor, alert and oriented x3     Data Reviewed:  I have personally reviewed following labs and imaging studies  Micro Results Recent Results (from the past 240 hour(s))  WET PREP FOR TRICH,  YEAST, CLUE     Status: None   Collection Time: 03/10/18 11:32 AM  Result Value Ref Range Status   Source: GENITAL  Final   RESULT   Final    Comment: TRICHOMONAS-NONE SEEN YEAST-NONE SEEN CLUE CELLS-NONE SEEN EPITHELIAL CELLS-PRESENT BACTERIA-MODERATE WBCS-FEW   Urine Culture     Status: None   Collection Time: 03/10/18 12:01 PM  Result Value Ref Range Status   MICRO NUMBER: [id number]  Final   SPECIMEN QUALITY: ADEQUATE  Final  Sample Source URINE  Final   STATUS: FINAL  Final   Result:   Final    Multiple organisms present, each less than 10,000 CFU/mL. These organisms, commonly found on external and internal genitalia, are considered to be colonizers. No further testing performed.  Blood culture (routine x 2)     Status: None (Preliminary result)   Collection Time: 03/11/18 12:25 PM  Result Value Ref Range Status   Specimen Description   Final    BLOOD RIGHT ARM Performed at [facility] [address] [place], [place] [address]    Special Requests   Final    BOTTLES DRAWN AEROBIC AND ANAEROBIC Blood Culture adequate volume Performed at [facility] [address] [place], [place] [address]    Culture   Final    NO GROWTH 3 DAYS Performed at [facility], [place] [address], [place], [place] [address]    Report Status PENDING  Incomplete  Blood culture (routine x 2)     Status: None (Preliminary result)   Collection Time: 03/11/18 12:34 PM  Result Value Ref Range Status   Specimen Description   Final    BLOOD BLOOD LEFT FOREARM Performed at [facility] [address], [place], [place] [address]    Special Requests   Final    BOTTLES DRAWN AEROBIC AND ANAEROBIC Blood Culture results may not be optimal due to an excessive volume of blood received in culture bottles Performed at [facility] [address] [place], [place] [address]    Culture   Final    NO GROWTH 3 DAYS Performed  at [facility], [place] [address], [place], [place] [address]    Report Status PENDING  Incomplete  Respiratory Panel by PCR     Status: None   Collection Time: 03/12/18  6:03 PM  Result Value Ref Range Status   Adenovirus NOT DETECTED NOT DETECTED Final   Coronavirus 229E NOT DETECTED NOT DETECTED Final   Coronavirus HKU1 NOT DETECTED NOT DETECTED Final   Coronavirus NL63 NOT DETECTED NOT DETECTED Final   Coronavirus OC43 NOT DETECTED NOT DETECTED Final   Metapneumovirus NOT DETECTED NOT DETECTED Final   Rhinovirus / Enterovirus NOT DETECTED NOT DETECTED Final   Influenza A NOT DETECTED NOT DETECTED Final   Influenza B NOT DETECTED NOT DETECTED Final   Parainfluenza Virus 1 NOT DETECTED NOT DETECTED Final   Parainfluenza Virus 2 NOT DETECTED NOT DETECTED Final   Parainfluenza Virus 3 NOT DETECTED NOT DETECTED Final   Parainfluenza Virus 4 NOT DETECTED NOT DETECTED Final   Respiratory Syncytial Virus NOT DETECTED NOT DETECTED Final   Bordetella pertussis NOT DETECTED NOT DETECTED Final   Chlamydophila pneumoniae NOT DETECTED NOT DETECTED Final   Mycoplasma pneumoniae NOT DETECTED NOT DETECTED Final    Comment: Performed at [facility], [place] [address] [place], [place] [address]    Radiology Reports Ct Abdomen Pelvis Wo Contrast  Result Date: 03/11/2018 CLINICAL DATA:  Leukocytosis. Elevated potassium and BUN. Weakness for the past 2 weeks. Metastatic melanoma. EXAM: CT CHEST, ABDOMEN AND PELVIS WITHOUT CONTRAST TECHNIQUE: Multidetector CT imaging of the chest, abdomen and pelvis was performed following the standard protocol without IV contrast. COMPARISON:  09/21/2017. FINDINGS: CT CHEST FINDINGS Cardiovascular: Atheromatous calcifications, including the coronary arteries and aorta. Mediastinum/Nodes: No enlarged mediastinal, hilar, or axillary lymph nodes. Thyroid gland, trachea, and esophagus demonstrate no significant findings. Lungs/Pleura: 7 mm left lower lobe  nodule on  image number 112 series 7, 8 mm previously. 5 mm left upper lobe nodule on image number 62 series 7, previously 5 mm. 6 mm subpleural nodule in the right middle lobe along the minor fissure on image number 76 series 7, previously 4 mm. This remains thin and plaque-like in the sagittal plane, unchanged in size in the sagittal plane. The previously demonstrated 3 mm right upper lobe nodule on the major fissure measures 5 mm on image number 64 series 7 today, also unchanged in the sagittal plane. No new nodules are seen. Musculoskeletal: Mild thoracic and lower cervical spine degenerative changes. CT ABDOMEN PELVIS FINDINGS Hepatobiliary: The liver remains mildly enlarged. No focal liver abnormality is seen. No gallstones, gallbladder wall thickening, or biliary dilatation. Pancreas: Unremarkable. No pancreatic ductal dilatation or surrounding inflammatory changes. Spleen: Normal in size without focal abnormality. Adrenals/Urinary Tract: The previously suspected small right renal cysts measures fluid density today. Otherwise, normal appearing adrenal glands, kidneys, ureters and urinary bladder. Stomach/Bowel: Multiple sigmoid colon diverticula. Mildly prominent stool. Normal appearing stomach, small bowel and appendix. Vascular/Lymphatic: Atheromatous arterial calcifications, including left renal artery branch calcifications peripherally. No enlarged lymph nodes. Reproductive: Status post hysterectomy. No adnexal masses. Other: Small umbilical hernia containing fat. Musculoskeletal: Mild lumbar spine degenerative changes. IMPRESSION: 1. No acute abnormality. 2. Stable small bilateral lung nodules, as described above. 3. Stable hepatomegaly. 4. Sigmoid diverticulosis. 5. Atheromatous arterial calcifications, including the coronary arteries and aorta. Electronically Signed   By: Claudie Revering M.D.   On: 03/11/2018 15:42   Dg Chest 2 View  Result Date: 03/11/2018 CLINICAL DATA:  Weakness 2 weeks with elevated white  blood cell count. Near syncope. EXAM: CHEST - 2 VIEW COMPARISON:  10/15/2013 FINDINGS: Lungs are adequately inflated without focal airspace consolidation or effusion. Linear scarring over the right upper lung unchanged. Subtle prominence of the perihilar markings which may be due to mild degree of vascular congestion versus viral bronchopneumonia. Cardiomediastinal silhouette and remainder the exam is unchanged. IMPRESSION: Subtle prominence of the perihilar markings which may be due to mild vascular congestion versus viral bronchopneumonia. Electronically Signed   By: Marin Olp M.D.   On: 03/11/2018 13:28   Ct Head Wo Contrast  Result Date: 03/11/2018 CLINICAL DATA:  Near syncopal episodes. EXAM: CT HEAD WITHOUT CONTRAST CT MAXILLOFACIAL WITHOUT CONTRAST TECHNIQUE: Multidetector CT imaging of the head and maxillofacial structures were performed using the standard protocol without intravenous contrast. Multiplanar CT image reconstructions of the maxillofacial structures were also generated. COMPARISON:  Brain MRI 09/19/2012 FINDINGS: CT HEAD FINDINGS Brain: No evidence of acute infarction, hemorrhage, hydrocephalus, extra-axial collection or mass lesion/mass effect. Vascular: No hyperdense vessel or unexpected calcification. Skull: Normal. Negative for fracture or focal lesion. Other: No scalp lesions or hematoma. CT MAXILLOFACIAL FINDINGS Osseous: No acute facial bone fractures or worrisome bone lesions. Orbits: No fracture. The globes are intact. No orbital mass. The extraocular muscles appear normal. Sinuses: Mild mucoperiosteal thickening involving scattered ethmoid sinuses. There is also a small mucous retention cyst or polyp in the floor of the left maxillary sinus. No findings for acute sinusitis. The mastoid air cells and middle ear cavities are clear. Soft tissues: No soft tissue mass or hematoma. Other: Fairly extensive dental caries are noted. IMPRESSION: 1. No acute intracranial findings or skull  fracture. 2. No acute facial bone fractures or bone lesions. 3. No significant sinus disease. 4. Dental caries. Electronically Signed   By: Marijo Sanes M.D.   On: 03/11/2018 15:36  Ct Soft Tissue Neck Wo Contrast  Result Date: 03/11/2018 CLINICAL DATA:  Swelling of the neck.  Suspicion of thyroiditis. EXAM: CT NECK WITHOUT CONTRAST TECHNIQUE: Multidetector CT imaging of the neck was performed following the standard protocol without intravenous contrast. COMPARISON:  None. FINDINGS: Pharynx and larynx: No mucosal or submucosal lesion. Salivary glands: Parotid and submandibular glands are unremarkable. Thyroid: The thyroid gland does not appear enlarged or show any focal lesion. Lymph nodes: No adenopathy. Vascular: Ordinary atherosclerosis at the carotid bifurcations. Otherwise negative. No contrast utilized. Limited intracranial: See results of head CT. Visualized orbits: See results of facial CT. Mastoids and visualized paranasal sinuses: See results of facial CT. Skeleton: Ordinary cervical spondylosis and facet arthritis. Upper chest: Negative Other: None IMPRESSION: The thyroid gland does not appear enlarged or show any focal finding. This is a noncontrast study. No other neck pathology of clinical relevance. Electronically Signed   By: Nelson Chimes M.D.   On: 03/11/2018 15:34   Ct Chest Wo Contrast  Result Date: 03/11/2018 CLINICAL DATA:  Leukocytosis. Elevated potassium and BUN. Weakness for the past 2 weeks. Metastatic melanoma. EXAM: CT CHEST, ABDOMEN AND PELVIS WITHOUT CONTRAST TECHNIQUE: Multidetector CT imaging of the chest, abdomen and pelvis was performed following the standard protocol without IV contrast. COMPARISON:  09/21/2017. FINDINGS: CT CHEST FINDINGS Cardiovascular: Atheromatous calcifications, including the coronary arteries and aorta. Mediastinum/Nodes: No enlarged mediastinal, hilar, or axillary lymph nodes. Thyroid gland, trachea, and esophagus demonstrate no significant  findings. Lungs/Pleura: 7 mm left lower lobe nodule on image number 112 series 7, 8 mm previously. 5 mm left upper lobe nodule on image number 62 series 7, previously 5 mm. 6 mm subpleural nodule in the right middle lobe along the minor fissure on image number 76 series 7, previously 4 mm. This remains thin and plaque-like in the sagittal plane, unchanged in size in the sagittal plane. The previously demonstrated 3 mm right upper lobe nodule on the major fissure measures 5 mm on image number 64 series 7 today, also unchanged in the sagittal plane. No new nodules are seen. Musculoskeletal: Mild thoracic and lower cervical spine degenerative changes. CT ABDOMEN PELVIS FINDINGS Hepatobiliary: The liver remains mildly enlarged. No focal liver abnormality is seen. No gallstones, gallbladder wall thickening, or biliary dilatation. Pancreas: Unremarkable. No pancreatic ductal dilatation or surrounding inflammatory changes. Spleen: Normal in size without focal abnormality. Adrenals/Urinary Tract: The previously suspected small right renal cysts measures fluid density today. Otherwise, normal appearing adrenal glands, kidneys, ureters and urinary bladder. Stomach/Bowel: Multiple sigmoid colon diverticula. Mildly prominent stool. Normal appearing stomach, small bowel and appendix. Vascular/Lymphatic: Atheromatous arterial calcifications, including left renal artery branch calcifications peripherally. No enlarged lymph nodes. Reproductive: Status post hysterectomy. No adnexal masses. Other: Small umbilical hernia containing fat. Musculoskeletal: Mild lumbar spine degenerative changes. IMPRESSION: 1. No acute abnormality. 2. Stable small bilateral lung nodules, as described above. 3. Stable hepatomegaly. 4. Sigmoid diverticulosis. 5. Atheromatous arterial calcifications, including the coronary arteries and aorta. Electronically Signed   By: Claudie Revering M.D.   On: 03/11/2018 15:42   Ct Maxillofacial Wo Contrast  Result  Date: 03/11/2018 CLINICAL DATA:  Near syncopal episodes. EXAM: CT HEAD WITHOUT CONTRAST CT MAXILLOFACIAL WITHOUT CONTRAST TECHNIQUE: Multidetector CT imaging of the head and maxillofacial structures were performed using the standard protocol without intravenous contrast. Multiplanar CT image reconstructions of the maxillofacial structures were also generated. COMPARISON:  Brain MRI 09/19/2012 FINDINGS: CT HEAD FINDINGS Brain: No evidence of acute infarction, hemorrhage, hydrocephalus, extra-axial collection or mass  lesion/mass effect. Vascular: No hyperdense vessel or unexpected calcification. Skull: Normal. Negative for fracture or focal lesion. Other: No scalp lesions or hematoma. CT MAXILLOFACIAL FINDINGS Osseous: No acute facial bone fractures or worrisome bone lesions. Orbits: No fracture. The globes are intact. No orbital mass. The extraocular muscles appear normal. Sinuses: Mild mucoperiosteal thickening involving scattered ethmoid sinuses. There is also a small mucous retention cyst or polyp in the floor of the left maxillary sinus. No findings for acute sinusitis. The mastoid air cells and middle ear cavities are clear. Soft tissues: No soft tissue mass or hematoma. Other: Fairly extensive dental caries are noted. IMPRESSION: 1. No acute intracranial findings or skull fracture. 2. No acute facial bone fractures or bone lesions. 3. No significant sinus disease. 4. Dental caries. Electronically Signed   By: Marijo Sanes M.D.   On: 03/11/2018 15:36    Lab Data:  CBC: Recent Labs  Lab 03/10/18 1220 03/11/18 1209 03/12/18 0437 03/13/18 0452 03/14/18 0433  WBC 32.0* 32.7* 26.7* 29.9* 31.0*  NEUTROABS 26,688*  --   --   --   --   HGB 15.1 14.6 13.9 13.7 13.7  HCT 51.8* 47.7* 47.2* 47.3* 47.1*  MCV 65.0* 66.6* 66.9* 67.7* 67.6*  PLT 793* 649* 587* 579* 696*   Basic Metabolic Panel: Recent Labs  Lab 03/10/18 1220 03/11/18 1209 03/12/18 0437 03/13/18 0452 03/14/18 0433  NA 141 140 139 142  145  K 6.6* 5.5* 5.6* 4.8 4.6  CL 100 105 103 103 108  CO2 25 25 27 30 26   GLUCOSE 118* 155* 116* 127* 138*  BUN 43* 32* 21* 18 15  CREATININE 0.85 0.79 0.71 0.66 0.56  CALCIUM 9.8 9.0 8.4* 9.3 9.4   GFR: Estimated Creatinine Clearance: 95.3 mL/min (by C-G formula based on SCr of 0.56 mg/dL). Liver Function Tests: Recent Labs  Lab 03/10/18 1220 03/12/18 0437  AST 11 44*  ALT 14 17  ALKPHOS  --  122  BILITOT 0.3 1.7*  PROT 7.3 6.5  ALBUMIN  --  3.2*   No results for input(s): LIPASE, AMYLASE in the last 168 hours. No results for input(s): AMMONIA in the last 168 hours. Coagulation Profile: Recent Labs  Lab 03/12/18 0437  INR 1.00   Cardiac Enzymes: No results for input(s): CKTOTAL, CKMB, CKMBINDEX, TROPONINI in the last 168 hours. BNP (last 3 results) No results for input(s): PROBNP in the last 8760 hours. HbA1C: No results for input(s): HGBA1C in the last 72 hours. CBG: Recent Labs  Lab 03/13/18 1139 03/13/18 1616 03/13/18 2206 03/14/18 0720 03/14/18 1152  GLUCAP 128* 139* 144* 138* 167*   Lipid Profile: No results for input(s): CHOL, HDL, LDLCALC, TRIG, CHOLHDL, LDLDIRECT in the last 72 hours. Thyroid Function Tests: No results for input(s): TSH, T4TOTAL, FREET4, T3FREE, THYROIDAB in the last 72 hours. Anemia Panel: No results for input(s): VITAMINB12, FOLATE, FERRITIN, TIBC, IRON, RETICCTPCT in the last 72 hours. Urine analysis:    Component Value Date/Time   COLORURINE STRAW (A) 03/12/2018 0142   APPEARANCEUR CLEAR 03/12/2018 0142   LABSPEC 1.014 03/12/2018 0142   PHURINE 5.0 03/12/2018 0142   GLUCOSEU >=500 (A) 03/12/2018 0142   HGBUR NEGATIVE 03/12/2018 0142   BILIRUBINUR NEGATIVE 03/12/2018 0142   KETONESUR NEGATIVE 03/12/2018 0142   PROTEINUR NEGATIVE 03/12/2018 0142   UROBILINOGEN 0.2 02/14/2014 1023   NITRITE NEGATIVE 03/12/2018 0142   LEUKOCYTESUR NEGATIVE 03/12/2018 0142     Pharrell Ledford M.D. [facility] 03/14/2018, 1:17  PM  Pager: [phone] Between  7am to 7pm - call Pager - [phone]  After 7pm go to [email or web address] - password [email or web address]  Call night coverage person covering after 7pm

## 2018-03-14 NOTE — Progress Notes (Addendum)
HEMATOLOGY/ONCOLOGY CONSULTATION NOTE  Date of Service: 03/14/2018  Patient Care Team: Buelah Manis, Modena Nunnery, MD as PCP - General (Family Medicine) Fanny Bien, MD as Attending Physician (Family Medicine) Everardo All, MD (Hematology and Oncology) [name], MD as Attending Physician (Cardiothoracic Surgery)  CHIEF COMPLAINTS/PURPOSE OF CONSULTATION:  Increasing Leukocytosis in the setting of h/o Jak2 mutation positive MPN  HISTORY OF PRESENTING ILLNESS:   Katelyn Cline is a wonderful 57 y.o. female who has been referred to [place] by Dr. Tana Coast  for evaluation and management of Leukocytosis. The pt reports that she is doing well overall.   The pt presented to the ED on 03/11/18 with significant weakness, hyperkalemia, and increasing leukocytosis with her PCP. She also notes that she had abscesses and facial infections about 4 weeks ago and received Clindamycin. She then developed a vaginal yeast infection, treated with Monistat and worsened to lesions and sores. The pt notes that since beginning antibiotics her cough has improved.   Patient report having a h/o melanoma metastatic to lung s/pwedge resection in 2014 . No recurrence since then. She notes she was been monitored with CT scans since then.  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  Patient notes that she previous needed therapeutic phlebotomy along time back but  denies ever taking medication for her polycythemia vera.   The pt notes that she used to sleep with a CPAP for many years. She then began treatment for her narcolepsy. The pt notes that she is on O2 and has remained on O2 since her surgery in 2014 to resect the melanoma. She has degenerative disc disease, fibromyalgia, and neuropathy in her feet.   The pt notes that she feels  less "groggy-headed" today.   Most recent lab results (03/14/18) of CBC and BMP is as follows: all values are WNL except for WBC at 31.0k, RBC at 6.97, HCT at 47.1, MCV at 67.6, MCH at 19.7, MCHC at 29.1, RDW at 22.3, PLT at 542k, Glucose at 138.  On review of systems, pt reports recent infections, feeling tired, moving her bowels well, resolved cough and denies abdominal pains, problems passing urine, and any other symptoms.   On PMHx the pt reports basal cell carcinoma in 2013, metastatic melanoma to lung wedge resected with thoracoscopy on 10/21/12.   MEDICAL HISTORY:  Past Medical History:  Diagnosis Date  . Agoraphobia   . Basal cell carcinoma   . Chronic pain   . Chronic respiratory failure with hypoxia ([name])    3L [place]  . DM type 2 (diabetes mellitus, type 2) ([place]) 10/16/2013  . Dysrhythmia    Hx: of palpitations "a long time ago"  . Elevated hemoglobin ([place]) 2014  . Fibromyalgia   . History of UTI   . HTN (hypertension)   . Hyperlipidemia   . Major depression   . Metastatic melanoma ([place]) 10/18/2012   12 mm posterior right upper lobe pulmonary nodule, max SUV 3.0    . Migraines   . Miscarriage    x 4  . Narcolepsy   . Panic attacks   . Peripheral neuropathy   . Peripheral neuropathy    in feet  . Polycythemia secondary to smoking   . Polycythemia vera(238.4)   . Scoliosis   . Shortness of breath    Hx: of  with activity  . Sleep apnea   . Vertigo     SURGICAL HISTORY: Past Surgical History:  Procedure Laterality Date  . BASAL CELL CARCINOMA EXCISION     flap surgery on face  . BREAST LUMPECTOMY     bilateral  . COLONOSCOPY W/ BIOPSIES AND POLYPECTOMY     Hx: of  . CYSTECTOMY     abdominal wall   . DILATION AND CURETTAGE OF UTERUS     Hx: of   . VAGINAL HYSTERECTOMY    . VIDEO ASSISTED THORACOSCOPY (VATS)/WEDGE RESECTION Right 10/21/2012   Procedure: VIDEO ASSISTED THORACOSCOPY (VATS)/WEDGE RESECTION;  Surgeon: [name], MD;  Location: [place];   Service: Thoracic;  Laterality: Right;  Marland Kitchen VIDEO BRONCHOSCOPY N/A 10/21/2012   Procedure: VIDEO BRONCHOSCOPY;  Surgeon: [name], MD;  Location: [facility] OR;  Service: Thoracic;  Laterality: N/A;    SOCIAL HISTORY: Social History   Socioeconomic History  . Marital status: Divorced    Spouse name: Not on file  . Number of children: Not on file  . Years of education: 12+  . Highest education level: Not on file  Occupational History  . Not on file  Social Needs  . Financial resource strain: Not on file  . Food insecurity:    Worry: Not on file    Inability: Not on file  . Transportation needs:    Medical: Not on file    Non-medical: Not on file  Tobacco Use  . Smoking status: Current Every Day Smoker    Packs/day: 1.00    Years: 10.00    Pack years: 10.00    Types: Cigarettes    Last attempt to quit: 09/16/2012    Years since quitting: 5.4  . Smokeless tobacco: Never Used  Substance and Sexual Activity  . Alcohol use: Yes    Comment: 1 -2 drinks a month, occasional  . Drug use: No  . Sexual activity: Not Currently  Lifestyle  . Physical activity:    Days per week: Not on file    Minutes per session: Not on file  . Stress: Not on file  Relationships  . Social connections:    Talks on phone: Not on file    Gets together: Not on file    Attends religious service: Not on file    Active member of club or organization: Not on file    Attends meetings of clubs or organizations: Not on file    Relationship status: Not on file  . Intimate partner violence:    Fear of current or ex partner: Not on file    Emotionally abused: Not on file    Physically abused: Not on file    Forced sexual activity: Not on file  Other Topics Concern  . Not on file  Social History Narrative  . Not on file    FAMILY HISTORY: Family History  Problem Relation Age of Onset  . Arthritis Mother   . Hyperlipidemia Mother   . Depression Mother   . Anxiety disorder Mother   . Dementia  Mother   . Hypertension Father   . Hyperlipidemia Father   . Heart disease Father   . Stroke Father   . Dementia Father   . Heart disease Brother   . ADD / ADHD Son   . Alcohol abuse Maternal Grandfather   . Bipolar disorder Neg Hx   . Drug abuse Neg Hx   . OCD Neg Hx   . Paranoid behavior Neg  Hx   . Schizophrenia Neg Hx   . Seizures Neg Hx   . Sexual abuse Neg Hx   . Physical abuse Neg Hx     ALLERGIES:  is allergic to contrast media [iodinated diagnostic agents]; erythromycin; flagyl [metronidazole]; latex; other; penicillins; and tetracyclines & related.  MEDICATIONS:  Current Facility-Administered Medications  Medication Dose Route Frequency Provider Last Rate Last Dose  . 0.9 %  sodium chloride infusion   Intravenous Continuous Roney Jaffe, MD 75 mL/hr at 03/14/18 0804    . amLODipine (NORVASC) tablet 5 mg  5 mg Oral Daily Nita Sells, MD   5 mg at 03/14/18 1117  . ceFEPIme (MAXIPIME) 2 g in sodium chloride 0.9 % 100 mL IVPB  2 g Intravenous Q8H Rai, Ripudeep K, MD 200 mL/hr at 03/14/18 1107 2 g at 03/14/18 1107  . DULoxetine (CYMBALTA) DR capsule 120 mg  120 mg Oral Daily Nita Sells, MD   120 mg at 03/14/18 1106  . enoxaparin (LOVENOX) injection 50 mg  50 mg Subcutaneous Q24H Nita Sells, MD   50 mg at 03/13/18 2212  . gabapentin (NEURONTIN) capsule 100 mg  100 mg Oral TID PRN Nita Sells, MD      . hydrALAZINE (APRESOLINE) tablet 50 mg  50 mg Oral Q8H Samtani, Jai-Gurmukh, MD   50 mg at 03/14/18 0700  . insulin aspart (novoLOG) injection 0-15 Units  0-15 Units Subcutaneous TID WC Roney Jaffe, MD   3 Units at 03/14/18 1250  . insulin aspart (novoLOG) injection 0-5 Units  0-5 Units Subcutaneous QHS Roney Jaffe, MD      . oxyCODONE-acetaminophen (PERCOCET/ROXICET) 5-325 MG per tablet 1 tablet  1 tablet Oral Q8H PRN Nita Sells, MD   1 tablet at 03/14/18 1107   And  . oxyCODONE (Oxy IR/ROXICODONE) immediate release tablet  2.5 mg  2.5 mg Oral Q8H PRN Nita Sells, MD   2.5 mg at 03/13/18 0159  . tiZANidine (ZANAFLEX) tablet 4 mg  4 mg Oral BID PRN Nita Sells, MD      . valACYclovir (VALTREX) tablet 1,000 mg  1,000 mg Oral BID Nita Sells, MD   1,000 mg at 03/14/18 1107  . vancomycin (VANCOCIN) IVPB 1000 mg/200 mL premix  1,000 mg Intravenous Q12H Rai, Ripudeep K, MD 200 mL/hr at 03/14/18 1227 1,000 mg at 03/14/18 1227    REVIEW OF SYSTEMS:    10 Point review of Systems was done is negative except as noted above.  PHYSICAL EXAMINATION: ECOG PERFORMANCE STATUS: 2 - Symptomatic, <50% confined to bed  . Vitals:   03/14/18 1117 03/14/18 1154  BP: (!) 131/98 (!) 155/68  Pulse: 70 74  Resp: 18 16  Temp:  98.2 F (36.8 C)  SpO2: 98% 95%   Filed Weights   03/11/18 1123  Weight: 228 lb 2 oz (103.5 kg)   .Body mass index is 34.69 kg/m.  GENERAL:alert, in no acute distress and comfortable SKIN: no acute rashes, no significant lesions EYES: conjunctiva are pink and non-injected, sclera anicteric OROPHARYNX: MMM, no exudates, no oropharyngeal erythema or ulceration NECK: supple, no JVD LYMPH:  no palpable lymphadenopathy in the cervical, axillary or inguinal regions LUNGS: clear to auscultation b/l with normal respiratory effort HEART: regular rate & rhythm ABDOMEN:  normoactive bowel sounds , non tender, not distended. Splenomegaly 3 fingerbreadths under costal margn Extremity: no pedal edema PSYCH: alert & oriented x 3 with fluent speech NEURO: no focal motor/sensory deficits  LABORATORY DATA:  I have reviewed the data as  listed  . CBC Latest Ref Rng & Units 03/14/2018 03/13/2018 03/12/2018  WBC 4.0 - 10.5 K/uL 31.0(H) 29.9(H) 26.7(H)  Hemoglobin 12.0 - 15.0 g/dL 13.7 13.7 13.9  Hematocrit 36.0 - 46.0 % 47.1(H) 47.3(H) 47.2(H)  Platelets 150 - 400 K/uL 542(H) 579(H) 587(H)   . CBC    Component Value Date/Time   WBC 31.0 (H) 03/14/2018 0433   RBC 6.97 (H) 03/14/2018  0433   HGB 13.7 03/14/2018 0433   HCT 47.1 (H) 03/14/2018 0433   PLT 542 (H) 03/14/2018 0433   MCV 67.6 (L) 03/14/2018 0433   MCH 19.7 (L) 03/14/2018 0433   MCHC 29.1 (L) 03/14/2018 0433   RDW 22.3 (H) 03/14/2018 0433   LYMPHSABS 3,040 03/10/2018 1220   MONOABS 0.9 09/17/2017 1538   EOSABS 928 (H) 03/10/2018 1220   BASOSABS 352 (H) 03/10/2018 1220     . CMP Latest Ref Rng & Units 03/14/2018 03/13/2018 03/12/2018  Glucose 70 - 99 mg/dL 138(H) 127(H) 116(H)  BUN 6 - 20 mg/dL 15 18 21(H)  Creatinine 0.44 - 1.00 mg/dL 0.56 0.66 0.71  Sodium 135 - 145 mmol/L 145 142 139  Potassium 3.5 - 5.1 mmol/L 4.6 4.8 5.6(H)  Chloride 98 - 111 mmol/L 108 103 103  CO2 22 - 32 mmol/L _0 Calcium 8.9 - 10.3 mg/dL 9.4 9.3 8.4(L)  Total Protein 6.5 - 8.1 g/dL - - 6.5  Total Bilirubin 0.3 - 1.2 mg/dL - - 1.7(H)  Alkaline Phos 38 - 126 U/L - - 122  AST 15 - 41 U/L - - 44(H)  ALT 0 - 44 U/L - - 17   08/15/12 JAK2 Mutation:     RADIOGRAPHIC STUDIES: I have personally reviewed the radiological images as listed and agreed with the findings in the report. Ct Abdomen Pelvis Wo Contrast  Result Date: 03/11/2018 CLINICAL DATA:  Leukocytosis. Elevated potassium and BUN. Weakness for the past 2 weeks. Metastatic melanoma. EXAM: CT CHEST, ABDOMEN AND PELVIS WITHOUT CONTRAST TECHNIQUE: Multidetector CT imaging of the chest, abdomen and pelvis was performed following the standard protocol without IV contrast. COMPARISON:  09/21/2017. FINDINGS: CT CHEST FINDINGS Cardiovascular: Atheromatous calcifications, including the coronary arteries and aorta. Mediastinum/Nodes: No enlarged mediastinal, hilar, or axillary lymph nodes. Thyroid gland, trachea, and esophagus demonstrate no significant findings. Lungs/Pleura: 7 mm left lower lobe nodule on image number 112 series 7, 8 mm previously. 5 mm left upper lobe nodule on image number 62 series 7, previously 5 mm. 6 mm subpleural nodule in the right middle lobe along the  minor fissure on image number 76 series 7, previously 4 mm. This remains thin and plaque-like in the sagittal plane, unchanged in size in the sagittal plane. The previously demonstrated 3 mm right upper lobe nodule on the major fissure measures 5 mm on image number 64 series 7 today, also unchanged in the sagittal plane. No new nodules are seen. Musculoskeletal: Mild thoracic and lower cervical spine degenerative changes. CT ABDOMEN PELVIS FINDINGS Hepatobiliary: The liver remains mildly enlarged. No focal liver abnormality is seen. No gallstones, gallbladder wall thickening, or biliary dilatation. Pancreas: Unremarkable. No pancreatic ductal dilatation or surrounding inflammatory changes. Spleen: Normal in size without focal abnormality. Adrenals/Urinary Tract: The previously suspected small right renal cysts measures fluid density today. Otherwise, normal appearing adrenal glands, kidneys, ureters and urinary bladder. Stomach/Bowel: Multiple sigmoid colon diverticula. Mildly prominent stool. Normal appearing stomach, small bowel and appendix. Vascular/Lymphatic: Atheromatous arterial calcifications, including left renal artery branch calcifications peripherally. No enlarged  lymph nodes. Reproductive: Status post hysterectomy. No adnexal masses. Other: Small umbilical hernia containing fat. Musculoskeletal: Mild lumbar spine degenerative changes. IMPRESSION: 1. No acute abnormality. 2. Stable small bilateral lung nodules, as described above. 3. Stable hepatomegaly. 4. Sigmoid diverticulosis. 5. Atheromatous arterial calcifications, including the coronary arteries and aorta. Electronically Signed   By: Claudie Revering M.D.   On: 03/11/2018 15:42   Dg Chest 2 View  Result Date: 03/11/2018 CLINICAL DATA:  Weakness 2 weeks with elevated white blood cell count. Near syncope. EXAM: CHEST - 2 VIEW COMPARISON:  10/15/2013 FINDINGS: Lungs are adequately inflated without focal airspace consolidation or effusion. Linear  scarring over the right upper lung unchanged. Subtle prominence of the perihilar markings which may be due to mild degree of vascular congestion versus viral bronchopneumonia. Cardiomediastinal silhouette and remainder the exam is unchanged. IMPRESSION: Subtle prominence of the perihilar markings which may be due to mild vascular congestion versus viral bronchopneumonia. Electronically Signed   By: Marin Olp M.D.   On: 03/11/2018 13:28   Ct Head Wo Contrast  Result Date: 03/11/2018 CLINICAL DATA:  Near syncopal episodes. EXAM: CT HEAD WITHOUT CONTRAST CT MAXILLOFACIAL WITHOUT CONTRAST TECHNIQUE: Multidetector CT imaging of the head and maxillofacial structures were performed using the standard protocol without intravenous contrast. Multiplanar CT image reconstructions of the maxillofacial structures were also generated. COMPARISON:  Brain MRI 09/19/2012 FINDINGS: CT HEAD FINDINGS Brain: No evidence of acute infarction, hemorrhage, hydrocephalus, extra-axial collection or mass lesion/mass effect. Vascular: No hyperdense vessel or unexpected calcification. Skull: Normal. Negative for fracture or focal lesion. Other: No scalp lesions or hematoma. CT MAXILLOFACIAL FINDINGS Osseous: No acute facial bone fractures or worrisome bone lesions. Orbits: No fracture. The globes are intact. No orbital mass. The extraocular muscles appear normal. Sinuses: Mild mucoperiosteal thickening involving scattered ethmoid sinuses. There is also a small mucous retention cyst or polyp in the floor of the left maxillary sinus. No findings for acute sinusitis. The mastoid air cells and middle ear cavities are clear. Soft tissues: No soft tissue mass or hematoma. Other: Fairly extensive dental caries are noted. IMPRESSION: 1. No acute intracranial findings or skull fracture. 2. No acute facial bone fractures or bone lesions. 3. No significant sinus disease. 4. Dental caries. Electronically Signed   By: Marijo Sanes M.D.   On:  03/11/2018 15:36   Ct Soft Tissue Neck Wo Contrast  Result Date: 03/11/2018 CLINICAL DATA:  Swelling of the neck.  Suspicion of thyroiditis. EXAM: CT NECK WITHOUT CONTRAST TECHNIQUE: Multidetector CT imaging of the neck was performed following the standard protocol without intravenous contrast. COMPARISON:  None. FINDINGS: Pharynx and larynx: No mucosal or submucosal lesion. Salivary glands: Parotid and submandibular glands are unremarkable. Thyroid: The thyroid gland does not appear enlarged or show any focal lesion. Lymph nodes: No adenopathy. Vascular: Ordinary atherosclerosis at the carotid bifurcations. Otherwise negative. No contrast utilized. Limited intracranial: See results of head CT. Visualized orbits: See results of facial CT. Mastoids and visualized paranasal sinuses: See results of facial CT. Skeleton: Ordinary cervical spondylosis and facet arthritis. Upper chest: Negative Other: None IMPRESSION: The thyroid gland does not appear enlarged or show any focal finding. This is a noncontrast study. No other neck pathology of clinical relevance. Electronically Signed   By: Nelson Chimes M.D.   On: 03/11/2018 15:34   Ct Chest Wo Contrast  Result Date: 03/11/2018 CLINICAL DATA:  Leukocytosis. Elevated potassium and BUN. Weakness for the past 2 weeks. Metastatic melanoma. EXAM: CT CHEST, ABDOMEN AND PELVIS  WITHOUT CONTRAST TECHNIQUE: Multidetector CT imaging of the chest, abdomen and pelvis was performed following the standard protocol without IV contrast. COMPARISON:  09/21/2017. FINDINGS: CT CHEST FINDINGS Cardiovascular: Atheromatous calcifications, including the coronary arteries and aorta. Mediastinum/Nodes: No enlarged mediastinal, hilar, or axillary lymph nodes. Thyroid gland, trachea, and esophagus demonstrate no significant findings. Lungs/Pleura: 7 mm left lower lobe nodule on image number 112 series 7, 8 mm previously. 5 mm left upper lobe nodule on image number 62 series 7, previously 5 mm.  6 mm subpleural nodule in the right middle lobe along the minor fissure on image number 76 series 7, previously 4 mm. This remains thin and plaque-like in the sagittal plane, unchanged in size in the sagittal plane. The previously demonstrated 3 mm right upper lobe nodule on the major fissure measures 5 mm on image number 64 series 7 today, also unchanged in the sagittal plane. No new nodules are seen. Musculoskeletal: Mild thoracic and lower cervical spine degenerative changes. CT ABDOMEN PELVIS FINDINGS Hepatobiliary: The liver remains mildly enlarged. No focal liver abnormality is seen. No gallstones, gallbladder wall thickening, or biliary dilatation. Pancreas: Unremarkable. No pancreatic ductal dilatation or surrounding inflammatory changes. Spleen: Normal in size without focal abnormality. Adrenals/Urinary Tract: The previously suspected small right renal cysts measures fluid density today. Otherwise, normal appearing adrenal glands, kidneys, ureters and urinary bladder. Stomach/Bowel: Multiple sigmoid colon diverticula. Mildly prominent stool. Normal appearing stomach, small bowel and appendix. Vascular/Lymphatic: Atheromatous arterial calcifications, including left renal artery branch calcifications peripherally. No enlarged lymph nodes. Reproductive: Status post hysterectomy. No adnexal masses. Other: Small umbilical hernia containing fat. Musculoskeletal: Mild lumbar spine degenerative changes. IMPRESSION: 1. No acute abnormality. 2. Stable small bilateral lung nodules, as described above. 3. Stable hepatomegaly. 4. Sigmoid diverticulosis. 5. Atheromatous arterial calcifications, including the coronary arteries and aorta. Electronically Signed   By: Claudie Revering M.D.   On: 03/11/2018 15:42   Ct Maxillofacial Wo Contrast  Result Date: 03/11/2018 CLINICAL DATA:  Near syncopal episodes. EXAM: CT HEAD WITHOUT CONTRAST CT MAXILLOFACIAL WITHOUT CONTRAST TECHNIQUE: Multidetector CT imaging of the head and  maxillofacial structures were performed using the standard protocol without intravenous contrast. Multiplanar CT image reconstructions of the maxillofacial structures were also generated. COMPARISON:  Brain MRI 09/19/2012 FINDINGS: CT HEAD FINDINGS Brain: No evidence of acute infarction, hemorrhage, hydrocephalus, extra-axial collection or mass lesion/mass effect. Vascular: No hyperdense vessel or unexpected calcification. Skull: Normal. Negative for fracture or focal lesion. Other: No scalp lesions or hematoma. CT MAXILLOFACIAL FINDINGS Osseous: No acute facial bone fractures or worrisome bone lesions. Orbits: No fracture. The globes are intact. No orbital mass. The extraocular muscles appear normal. Sinuses: Mild mucoperiosteal thickening involving scattered ethmoid sinuses. There is also a small mucous retention cyst or polyp in the floor of the left maxillary sinus. No findings for acute sinusitis. The mastoid air cells and middle ear cavities are clear. Soft tissues: No soft tissue mass or hematoma. Other: Fairly extensive dental caries are noted. IMPRESSION: 1. No acute intracranial findings or skull fracture. 2. No acute facial bone fractures or bone lesions. 3. No significant sinus disease. 4. Dental caries. Electronically Signed   By: Marijo Sanes M.D.   On: 03/11/2018 15:36    ASSESSMENT & PLAN:   57 y.o. female with  1. Jak2 V617F mutation positive Myeloproliferative syndrome.- Likely Polycythemia Vera  Current hgb.hct are at 13.7/45 Thrombocytosis PLT 542k  2. Increasing leucocytosis - primarily neutrophilia. - Leukemoid reaction. Patient with recent dental infections/abscesses and respiratory infection. Part  of Leukocytosis could be due to his MPN  - PCV vs Myelofibrosis.  Neutrophilia began in March 2015 at 9.6k, slowly increased to 14k in August 2018, 19k in March 2019, now to [place] in September 2019.  3. Polycythemia vera Confirmed by 08/15/12 JAK 2 mutation test  3. History of  Metastatic melanoma to the right lung  S/p resection by Dr. Servando Snare on 10/21/12, BRAF mutation not detected.  PLAN: -Discussed patient's most recent labs from 03/14/18, WBC at 31.0k, HCT at 47.1, RBC at 6.97, PLT at 542k -JAK2 mutation identified in 08/15/12 labs -Discussed that an untreated polycythemia vera can certainly cause leukocytosis  -Discussed that myeloproliferative neoplasms can transform into Acute myeloid leukemia or can develop into myelofibrosis -Discussed that a BM Bx is indicated and will refer pt to IR to rule out primary myelofibrosis  Vs post PCV myelofibrosis. -outpatient f/u with Dr Irene Limbo in 1-2 weeks with labs  All of the patients questions were answered with apparent satisfaction. The patient knows to call the clinic with any problems, questions or concerns.  The total time spent in the appt was 80 minutes and more than 50% was on counseling and direct patient cares.     Sullivan Lone MD MS AAHIVMS [facility] Hematology/Oncology Physician [facility]  (Office):       [phone] (Work cell):  [phone] (Fax):           [phone]  03/14/2018 4:43 PM  I, Baldwin [place], am acting as a scribe for [name]  .I have reviewed the above documentation for accuracy and completeness, and I agree with the above. Sullivan Lone MD MS

## 2018-03-15 ENCOUNTER — Encounter (HOSPITAL_COMMUNITY): Payer: Self-pay | Admitting: Radiology

## 2018-03-15 ENCOUNTER — Inpatient Hospital Stay (HOSPITAL_COMMUNITY): Payer: Medicare HMO

## 2018-03-15 LAB — BASIC METABOLIC PANEL
Anion gap: 8 (ref 5–15)
BUN: 13 mg/dL (ref 6–20)
CO2: 27 mmol/L (ref 22–32)
CREATININE: 0.48 mg/dL (ref 0.44–1.00)
Calcium: 9.1 mg/dL (ref 8.9–10.3)
Chloride: 108 mmol/L (ref 98–111)
GFR calc Af Amer: >60 mL/min (ref >60)
Glucose, Bld: 142 mg/dL — ABNORMAL HIGH (ref 70–99)
POTASSIUM: 4.1 mmol/L (ref 3.5–5.1)
SODIUM: 143 mmol/L (ref 135–145)

## 2018-03-15 LAB — CBC WITH DIFFERENTIAL/PLATELET
BASOS PCT: 0 %
Basophils Absolute: 0.1 10*3/uL (ref 0.0–0.1)
EOS ABS: 0.8 10*3/uL — AB (ref 0.0–0.7)
EOS PCT: 3 %
HCT: 45.3 % (ref 36.0–46.0)
Hemoglobin: 13.1 g/dL (ref 12.0–15.0)
LYMPHS ABS: 1.6 10*3/uL (ref 0.7–4.0)
Lymphocytes Relative: 6 %
MCH: 19.6 pg — AB (ref 26.0–34.0)
MCHC: 28.9 g/dL — AB (ref 30.0–36.0)
MCV: 67.8 fL — AB (ref 78.0–100.0)
MONO ABS: 1 10*3/uL (ref 0.1–1.0)
MONOS PCT: 4 %
NEUTROS PCT: 87 %
Neutro Abs: 23.4 10*3/uL — ABNORMAL HIGH (ref 1.7–7.7)
PLATELETS: 544 10*3/uL — AB (ref 150–400)
RBC: 6.68 MIL/uL — ABNORMAL HIGH (ref 3.87–5.11)
RDW: 22.6 % — ABNORMAL HIGH (ref 11.5–15.5)
WBC: 26.9 10*3/uL — ABNORMAL HIGH (ref 4.0–10.5)

## 2018-03-15 LAB — GLUCOSE, CAPILLARY
GLUCOSE-CAPILLARY: 123 mg/dL — AB (ref 70–99)
GLUCOSE-CAPILLARY: 118 mg/dL — AB (ref 70–99)
GLUCOSE-CAPILLARY: 183 mg/dL — AB (ref 70–99)
GLUCOSE-CAPILLARY: 180 mg/dL — AB (ref 70–99)

## 2018-03-15 MED ORDER — FENTANYL CITRATE (PF) 100 MCG/2ML IJ SOLN
INTRAMUSCULAR | Status: AC | PRN
  Administered 2018-03-15: 50 ug via INTRAVENOUS

## 2018-03-15 MED ORDER — MIDAZOLAM HCL 2 MG/2ML IJ SOLN
INTRAMUSCULAR | Status: AC | PRN
  Administered 2018-03-15: 1 mg via INTRAVENOUS

## 2018-03-15 MED ORDER — LIDOCAINE HCL (PF) 1 % IJ SOLN
INTRAMUSCULAR | Status: AC | PRN
  Administered 2018-03-15: 10 mL

## 2018-03-15 MED ORDER — MIDAZOLAM HCL 2 MG/2ML IJ SOLN
INTRAMUSCULAR | Status: AC
  Filled 2018-03-15: qty 4

## 2018-03-15 MED ORDER — FENTANYL CITRATE (PF) 100 MCG/2ML IJ SOLN
INTRAMUSCULAR | Status: AC
  Filled 2018-03-15: qty 2

## 2018-03-15 NOTE — Consult Note (Signed)
Chief Complaint: Patient was seen in consultation today for CT guided bone marrow biopsy Chief Complaint  Patient presents with  . Abnormal Lab  . Weakness     Referring Physician(s): Kale,G  Supervising Physician: Daryll Brod  Patient Status: Katelyn Cline - In-pt  History of Present Illness: Katelyn Cline is a 57 y.o. female smoker with history of melanoma metastatic to the lung with prior wedge resection in 2014.  She  has had no recurrence since then.  She also has a history of JAK2 V617F mutation positive polycythemia vera with additional secondary polycythemia due to COPD and sleep apnea.  She now has increasing leukocytosis of uncertain etiology and request  received for CT-guided bone marrow biopsy for further evaluation.  Past Medical History:  Diagnosis Date  . Agoraphobia   . Basal cell carcinoma   . Chronic pain   . Chronic respiratory failure with hypoxia ([name])    [place]  . DM type 2 (diabetes mellitus, type 2) ([place]) 10/16/2013  . Dysrhythmia    Hx: of palpitations "a long time ago"  . Elevated hemoglobin ([place]) 2014  . Fibromyalgia   . History of UTI   . HTN (hypertension)   . Hyperlipidemia   . Major depression   . Metastatic melanoma ([name]) 10/18/2012   12 mm posterior right upper lobe pulmonary nodule, max SUV 3.0    . Migraines   . Miscarriage    x 4  . Narcolepsy   . Panic attacks   . Peripheral neuropathy   . Peripheral neuropathy    in feet  . Polycythemia secondary to smoking   . Polycythemia vera(238.4)   . Scoliosis   . Shortness of breath    Hx: of with activity  . Sleep apnea   . Vertigo     Past Surgical History:  Procedure Laterality Date  . BASAL CELL CARCINOMA EXCISION     flap surgery on face  . BREAST LUMPECTOMY     bilateral  . COLONOSCOPY W/ BIOPSIES AND POLYPECTOMY     Hx: of  . CYSTECTOMY     abdominal wall   . DILATION AND CURETTAGE OF UTERUS     Hx: of   . VAGINAL HYSTERECTOMY    . VIDEO ASSISTED THORACOSCOPY  (VATS)/WEDGE RESECTION Right 10/21/2012   Procedure: VIDEO ASSISTED THORACOSCOPY (VATS)/WEDGE RESECTION;  Surgeon: [name], MD;  Location: [facility];  Service: Thoracic;  Laterality: Right;  Marland Kitchen VIDEO BRONCHOSCOPY N/A 10/21/2012   Procedure: VIDEO BRONCHOSCOPY;  Surgeon: [name], MD;  Location: [facility];  Service: Thoracic;  Laterality: N/A;    Allergies: Contrast media [iodinated diagnostic agents]; Erythromycin; Flagyl [metronidazole]; Latex; Other; Penicillins; and Tetracyclines & related  Medications: Prior to Admission medications   Medication Sig Start Date End Date Taking? Authorizing Provider  amLODipine (NORVASC) 5 MG tablet TAKE 1 TABLET BY MOUTH EVERY DAY 01/07/18  Yes [place], Modena Nunnery, MD  atorvastatin (LIPITOR) 80 MG tablet Take 1 tablet (80 mg total) by mouth daily at 6 PM. 10/19/17  Yes [place], Modena Nunnery, MD  DULoxetine (CYMBALTA) 60 MG capsule TAKE 2 CAPSULES BY MOUTH EVERY DAY 02/25/18  Yes [place], Modena Nunnery, MD  empagliflozin (JARDIANCE) 25 MG TABS tablet Take 25 mg by mouth daily. 10/19/17  Yes [place], Modena Nunnery, MD  fenofibrate (TRICOR) 48 MG tablet TAKE 1 TABLET(48 MG) BY MOUTH DAILY Patient taking differently: Take 48 mg by mouth daily.  01/07/18  Yes [place], Modena Nunnery, MD  fish oil-omega-3  fatty acids 1000 MG capsule Take 2 g by mouth 2 (two) times daily.   Yes [provider]  gabapentin (NEURONTIN) 100 MG capsule TAKE 1 TO 3 CAPSULES(100 TO 300 MG) BY MOUTH THREE TIMES DAILY AS NEEDED FOR NERVE PAIN Patient taking differently: Take 100 mg by mouth 3 (three) times daily as needed (for pain).  02/25/18  Yes [place], Modena Nunnery, MD  ibuprofen (ADVIL,MOTRIN) 800 MG tablet TAKE 1 TABLET BY MOUTH EVERY 8 HOURS AS NEEDED Patient taking differently: Take 800 mg by mouth every 8 (eight) hours as needed for moderate pain.  02/25/18  Yes [place], Modena Nunnery, MD  lisinopril (PRINIVIL,ZESTRIL) 5 MG tablet TAKE 1 TABLET BY MOUTH DAILY Patient taking differently: Take 5 mg  by mouth daily.  01/07/18  Yes [place], Modena Nunnery, MD  metFORMIN (GLUCOPHAGE) 1000 MG tablet TAKE 1 TABLET BY MOUTH TWICE DAILY WITH A MEAL Patient taking differently: Take 1,000 mg by mouth 2 (two) times daily with a meal.  12/08/17  Yes [place], Modena Nunnery, MD  oxyCODONE-acetaminophen (PERCOCET) 7.5-325 MG tablet Take 1 tablet by mouth 3 (three) times daily as needed for severe pain. 02/21/18  Yes [place], Modena Nunnery, MD  Pseudoeph-Doxylamine-DM-APAP (NYQUIL PO) Take 1-2 capsules by mouth at bedtime as needed (for cold/flu).   Yes [provider]  tiZANidine (ZANAFLEX) 4 MG tablet Take 1 tablet (4 mg total) by mouth 2 (two) times daily as needed for muscle spasms. 02/21/18  Yes [place], Modena Nunnery, MD  triamterene-hydrochlorothiazide (DYAZIDE) 37.5-25 MG capsule TAKE 1 CAPSULE BY MOUTH EVERY MORNING Patient taking differently: Take 1 capsule by mouth daily.  12/24/17  Yes [place], Modena Nunnery, MD  valACYclovir (VALTREX) 1000 MG tablet Take 1 tablet (1,000 mg total) by mouth 2 (two) times daily. 03/10/18  Yes Delsa Grana, PA-C  Blood Glucose Monitoring Suppl (BLOOD GLUCOSE SYSTEM PAK) KIT Use as directed to monitor FSBS up to 3x daily for fluctuating FSBS. Dx: E11.65 02/21/18   [name], MD  clindamycin (CLEOCIN) 300 MG capsule Take 1 capsule (300 mg total) by mouth 3 (three) times daily. Patient not taking: Reported on 03/11/2018 02/21/18   [name], MD  diphenhydrAMINE (BENADRYL) 50 MG tablet Take 1 tablet 1 hour hour prior to study Patient not taking: Reported on 03/11/2018 09/06/17   Holley Bouche, NP  Glucose Blood (BLOOD GLUCOSE TEST STRIPS) STRP Please dispense based on insurance and patient preference. Use as directed to monitor FSBS 1x daily. Dx: E11.9. 10/12/17   [name], MD  MICROLET LANCETS MISC Please dispense based on patient and insurance preference. Use as directed to monitor  FSBS 1x daily. Dx: E11.9. 10/12/17   [name], MD     Family History    Problem Relation Age of Onset  . Arthritis Mother   . Hyperlipidemia Mother   . Depression Mother   . Anxiety disorder Mother   . Dementia Mother   . Hypertension Father   . Hyperlipidemia Father   . Heart disease Father   . Stroke Father   . Dementia Father   . Heart disease Brother   . ADD / ADHD Son   . Alcohol abuse Maternal Grandfather   . Bipolar disorder Neg Hx   . Drug abuse Neg Hx   . OCD Neg Hx   . Paranoid behavior Neg Hx   . Schizophrenia Neg Hx   . Seizures Neg Hx   . Sexual abuse Neg Hx   . Physical abuse  Neg Hx     Social History   Socioeconomic History  . Marital status: Divorced    Spouse name: Not on file  . Number of children: Not on file  . Years of education: 12+  . Highest education level: Not on file  Occupational History  . Not on file  Social Needs  . Financial resource strain: Not on file  . Food insecurity:    Worry: Not on file    Inability: Not on file  . Transportation needs:    Medical: Not on file    Non-medical: Not on file  Tobacco Use  . Smoking status: Current Every Day Smoker    Packs/day: 1.00    Years: 10.00    Pack years: 10.00    Types: Cigarettes    Last attempt to quit: 09/16/2012    Years since quitting: 5.4  . Smokeless tobacco: Never Used  Substance and Sexual Activity  . Alcohol use: Yes    Comment: 1 -2 drinks a month, occasional  . Drug use: No  . Sexual activity: Not Currently  Lifestyle  . Physical activity:    Days per week: Not on file    Minutes per session: Not on file  . Stress: Not on file  Relationships  . Social connections:    Talks on phone: Not on file    Gets together: Not on file    Attends religious service: Not on file    Active member of club or organization: Not on file    Attends meetings of clubs or organizations: Not on file    Relationship status: Not on file  Other Topics Concern  . Not on file  Social History Narrative  . Not on file     Review of Systems denies  fever, chest pain, abdominal pain, nausea, vomiting or bleeding.  She does have occasional headaches, some dyspnea with exertion, occasional cough, and back pain  Vital Signs: BP 128/68 (BP Location: Right Arm)   Pulse 72   Temp 98.3 F (36.8 C) (Oral)   Resp 18   Ht '5\' 8"'$  (1.727 m)   Wt 228 lb 2 oz (103.5 kg)   SpO2 97%   BMI 34.69 kg/m   Physical Exam awake, alert.  Chest clear to auscultation bilaterally anteriorly.  Heart with regular rate and rhythm.  Abdomen obese, soft, positive bowel sounds, nontender.  No pedal edema.  Imaging: Ct Abdomen Pelvis Wo Contrast  Result Date: 03/11/2018 CLINICAL DATA:  Leukocytosis. Elevated potassium and BUN. Weakness for the past 2 weeks. Metastatic melanoma. EXAM: CT CHEST, ABDOMEN AND PELVIS WITHOUT CONTRAST TECHNIQUE: Multidetector CT imaging of the chest, abdomen and pelvis was performed following the standard protocol without IV contrast. COMPARISON:  09/21/2017. FINDINGS: CT CHEST FINDINGS Cardiovascular: Atheromatous calcifications, including the coronary arteries and aorta. Mediastinum/Nodes: No enlarged mediastinal, hilar, or axillary lymph nodes. Thyroid gland, trachea, and esophagus demonstrate no significant findings. Lungs/Pleura: 7 mm left lower lobe nodule on image number 112 series 7, 8 mm previously. 5 mm left upper lobe nodule on image number 62 series 7, previously 5 mm. 6 mm subpleural nodule in the right middle lobe along the minor fissure on image number 76 series 7, previously 4 mm. This remains thin and plaque-like in the sagittal plane, unchanged in size in the sagittal plane. The previously demonstrated 3 mm right upper lobe nodule on the major fissure measures 5 mm on image number 64 series 7 today, also unchanged in the sagittal plane. No  new nodules are seen. Musculoskeletal: Mild thoracic and lower cervical spine degenerative changes. CT ABDOMEN PELVIS FINDINGS Hepatobiliary: The liver remains mildly enlarged. No focal liver  abnormality is seen. No gallstones, gallbladder wall thickening, or biliary dilatation. Pancreas: Unremarkable. No pancreatic ductal dilatation or surrounding inflammatory changes. Spleen: Normal in size without focal abnormality. Adrenals/Urinary Tract: The previously suspected small right renal cysts measures fluid density today. Otherwise, normal appearing adrenal glands, kidneys, ureters and urinary bladder. Stomach/Bowel: Multiple sigmoid colon diverticula. Mildly prominent stool. Normal appearing stomach, small bowel and appendix. Vascular/Lymphatic: Atheromatous arterial calcifications, including left renal artery branch calcifications peripherally. No enlarged lymph nodes. Reproductive: Status post hysterectomy. No adnexal masses. Other: Small umbilical hernia containing fat. Musculoskeletal: Mild lumbar spine degenerative changes. IMPRESSION: 1. No acute abnormality. 2. Stable small bilateral lung nodules, as described above. 3. Stable hepatomegaly. 4. Sigmoid diverticulosis. 5. Atheromatous arterial calcifications, including the coronary arteries and aorta. Electronically Signed   By: Claudie Revering M.D.   On: 03/11/2018 15:42   Dg Chest 2 View  Result Date: 03/11/2018 CLINICAL DATA:  Weakness 2 weeks with elevated white blood cell count. Near syncope. EXAM: CHEST - 2 VIEW COMPARISON:  10/15/2013 FINDINGS: Lungs are adequately inflated without focal airspace consolidation or effusion. Linear scarring over the right upper lung unchanged. Subtle prominence of the perihilar markings which may be due to mild degree of vascular congestion versus viral bronchopneumonia. Cardiomediastinal silhouette and remainder the exam is unchanged. IMPRESSION: Subtle prominence of the perihilar markings which may be due to mild vascular congestion versus viral bronchopneumonia. Electronically Signed   By: Marin Olp M.D.   On: 03/11/2018 13:28   Ct Head Wo Contrast  Result Date: 03/11/2018 CLINICAL DATA:  Near  syncopal episodes. EXAM: CT HEAD WITHOUT CONTRAST CT MAXILLOFACIAL WITHOUT CONTRAST TECHNIQUE: Multidetector CT imaging of the head and maxillofacial structures were performed using the standard protocol without intravenous contrast. Multiplanar CT image reconstructions of the maxillofacial structures were also generated. COMPARISON:  Brain MRI 09/19/2012 FINDINGS: CT HEAD FINDINGS Brain: No evidence of acute infarction, hemorrhage, hydrocephalus, extra-axial collection or mass lesion/mass effect. Vascular: No hyperdense vessel or unexpected calcification. Skull: Normal. Negative for fracture or focal lesion. Other: No scalp lesions or hematoma. CT MAXILLOFACIAL FINDINGS Osseous: No acute facial bone fractures or worrisome bone lesions. Orbits: No fracture. The globes are intact. No orbital mass. The extraocular muscles appear normal. Sinuses: Mild mucoperiosteal thickening involving scattered ethmoid sinuses. There is also a small mucous retention cyst or polyp in the floor of the left maxillary sinus. No findings for acute sinusitis. The mastoid air cells and middle ear cavities are clear. Soft tissues: No soft tissue mass or hematoma. Other: Fairly extensive dental caries are noted. IMPRESSION: 1. No acute intracranial findings or skull fracture. 2. No acute facial bone fractures or bone lesions. 3. No significant sinus disease. 4. Dental caries. Electronically Signed   By: Marijo Sanes M.D.   On: 03/11/2018 15:36   Ct Soft Tissue Neck Wo Contrast  Result Date: 03/11/2018 CLINICAL DATA:  Swelling of the neck.  Suspicion of thyroiditis. EXAM: CT NECK WITHOUT CONTRAST TECHNIQUE: Multidetector CT imaging of the neck was performed following the standard protocol without intravenous contrast. COMPARISON:  None. FINDINGS: Pharynx and larynx: No mucosal or submucosal lesion. Salivary glands: Parotid and submandibular glands are unremarkable. Thyroid: The thyroid gland does not appear enlarged or show any focal  lesion. Lymph nodes: No adenopathy. Vascular: Ordinary atherosclerosis at the carotid bifurcations. Otherwise negative. No contrast utilized. Limited  intracranial: See results of head CT. Visualized orbits: See results of facial CT. Mastoids and visualized paranasal sinuses: See results of facial CT. Skeleton: Ordinary cervical spondylosis and facet arthritis. Upper chest: Negative Other: None IMPRESSION: The thyroid gland does not appear enlarged or show any focal finding. This is a noncontrast study. No other neck pathology of clinical relevance. Electronically Signed   By: Nelson Chimes M.D.   On: 03/11/2018 15:34   Ct Chest Wo Contrast  Result Date: 03/11/2018 CLINICAL DATA:  Leukocytosis. Elevated potassium and BUN. Weakness for the past 2 weeks. Metastatic melanoma. EXAM: CT CHEST, ABDOMEN AND PELVIS WITHOUT CONTRAST TECHNIQUE: Multidetector CT imaging of the chest, abdomen and pelvis was performed following the standard protocol without IV contrast. COMPARISON:  09/21/2017. FINDINGS: CT CHEST FINDINGS Cardiovascular: Atheromatous calcifications, including the coronary arteries and aorta. Mediastinum/Nodes: No enlarged mediastinal, hilar, or axillary lymph nodes. Thyroid gland, trachea, and esophagus demonstrate no significant findings. Lungs/Pleura: 7 mm left lower lobe nodule on image number 112 series 7, 8 mm previously. 5 mm left upper lobe nodule on image number 62 series 7, previously 5 mm. 6 mm subpleural nodule in the right middle lobe along the minor fissure on image number 76 series 7, previously 4 mm. This remains thin and plaque-like in the sagittal plane, unchanged in size in the sagittal plane. The previously demonstrated 3 mm right upper lobe nodule on the major fissure measures 5 mm on image number 64 series 7 today, also unchanged in the sagittal plane. No new nodules are seen. Musculoskeletal: Mild thoracic and lower cervical spine degenerative changes. CT ABDOMEN PELVIS FINDINGS  Hepatobiliary: The liver remains mildly enlarged. No focal liver abnormality is seen. No gallstones, gallbladder wall thickening, or biliary dilatation. Pancreas: Unremarkable. No pancreatic ductal dilatation or surrounding inflammatory changes. Spleen: Normal in size without focal abnormality. Adrenals/Urinary Tract: The previously suspected small right renal cysts measures fluid density today. Otherwise, normal appearing adrenal glands, kidneys, ureters and urinary bladder. Stomach/Bowel: Multiple sigmoid colon diverticula. Mildly prominent stool. Normal appearing stomach, small bowel and appendix. Vascular/Lymphatic: Atheromatous arterial calcifications, including left renal artery branch calcifications peripherally. No enlarged lymph nodes. Reproductive: Status post hysterectomy. No adnexal masses. Other: Small umbilical hernia containing fat. Musculoskeletal: Mild lumbar spine degenerative changes. IMPRESSION: 1. No acute abnormality. 2. Stable small bilateral lung nodules, as described above. 3. Stable hepatomegaly. 4. Sigmoid diverticulosis. 5. Atheromatous arterial calcifications, including the coronary arteries and aorta. Electronically Signed   By: Claudie Revering M.D.   On: 03/11/2018 15:42   Ct Maxillofacial Wo Contrast  Result Date: 03/11/2018 CLINICAL DATA:  Near syncopal episodes. EXAM: CT HEAD WITHOUT CONTRAST CT MAXILLOFACIAL WITHOUT CONTRAST TECHNIQUE: Multidetector CT imaging of the head and maxillofacial structures were performed using the standard protocol without intravenous contrast. Multiplanar CT image reconstructions of the maxillofacial structures were also generated. COMPARISON:  Brain MRI 09/19/2012 FINDINGS: CT HEAD FINDINGS Brain: No evidence of acute infarction, hemorrhage, hydrocephalus, extra-axial collection or mass lesion/mass effect. Vascular: No hyperdense vessel or unexpected calcification. Skull: Normal. Negative for fracture or focal lesion. Other: No scalp lesions or  hematoma. CT MAXILLOFACIAL FINDINGS Osseous: No acute facial bone fractures or worrisome bone lesions. Orbits: No fracture. The globes are intact. No orbital mass. The extraocular muscles appear normal. Sinuses: Mild mucoperiosteal thickening involving scattered ethmoid sinuses. There is also a small mucous retention cyst or polyp in the floor of the left maxillary sinus. No findings for acute sinusitis. The mastoid air cells and middle ear cavities are clear.  Soft tissues: No soft tissue mass or hematoma. Other: Fairly extensive dental caries are noted. IMPRESSION: 1. No acute intracranial findings or skull fracture. 2. No acute facial bone fractures or bone lesions. 3. No significant sinus disease. 4. Dental caries. Electronically Signed   By: Marijo Sanes M.D.   On: 03/11/2018 15:36    Labs:  CBC: Recent Labs    03/12/18 0437 03/13/18 0452 03/14/18 0433 03/15/18 0826  WBC 26.7* 29.9* 31.0* 26.9*  HGB 13.9 13.7 13.7 13.1  HCT 47.2* 47.3* 47.1* 45.3  PLT 587* 579* 542* 544*    COAGS: Recent Labs    03/12/18 0437  INR 1.00    BMP: Recent Labs    03/12/18 0437 03/13/18 0452 03/14/18 0433 03/15/18 0826  NA 139 142 145 143  K 5.6* 4.8 4.6 4.1  CL 103 103 108 108  CO2 '27 30 26 27  '$ GLUCOSE 116* 127* 138* 142*  BUN 21* '18 15 13  '$ CALCIUM 8.4* 9.3 9.4 9.1  CREATININE 0.71 0.66 0.56 0.48  GFRNONAA >60 >60 >60 >60  GFRAA >60 >60 >60 >60    LIVER FUNCTION TESTS: Recent Labs    10/11/17 1150 02/21/18 1025 03/10/18 1220 03/12/18 0437  BILITOT 0.3 0.4 0.3 1.7*  AST 7* 12 11 44*  ALT 5* '10 14 17  '$ ALKPHOS  --   --   --  122  PROT 7.0 7.0 7.3 6.5  ALBUMIN  --   --   --  3.2*    TUMOR MARKERS: No results for input(s): AFPTM, CEA, CA199, CHROMGRNA in the last 8760 hours.  Assessment and Plan:  57 y.o. female smoker with history of melanoma metastatic to the lung with prior wedge resection in 2014.  She  has had no recurrence since then.  She also has a history of JAK2  V617F mutation positive polycythemia vera with additional secondary polycythemia due to COPD and sleep apnea.  She now has increasing leukocytosis of uncertain etiology and request  received for CT-guided bone marrow biopsy for further evaluation.Risks and benefits discussed with the patient including, but not limited to bleeding, infection, damage to adjacent structures or low yield requiring additional tests.  All of the patient's questions were answered, patient is agreeable to proceed. Consent signed and in chart.  Procedure tentatively scheduled for this morning   Thank you for this interesting consult.  I greatly enjoyed meeting NEAL OSHEA and look forward to participating in their care.  A copy of this report was sent to the requesting provider on this date.  Electronically Signed: D. Rowe Robert, PA-C 03/15/2018, 9:38 AM   I spent a total of 25 minutes    in face to face in clinical consultation, greater than 50% of which was counseling/coordinating care for CT-guided bone marrow biopsy

## 2018-03-15 NOTE — Discharge Instructions (Signed)
Bone Marrow Aspiration and Bone Marrow Biopsy, Adult, Care After This sheet gives you information about how to care for yourself after your procedure. Your health care provider may also give you more specific instructions. If you have problems or questions, contact your health care provider. What can I expect after the procedure? After the procedure, it is common to have:  Mild pain and tenderness.  Swelling.  Bruising.  Follow these instructions at home:  Take over-the-counter or prescription medicines only as told by your health care provider.  Do not take baths, swim, or use a hot tub until your health care provider approves. Ask if you can take a shower or have a sponge bath.  Follow instructions from your health care provider about how to take care of the puncture site. Make sure you: ? Wash your hands with soap and water before you change your bandage (dressing). If soap and water are not available, use hand sanitizer. ? Change your dressing as told by your health care provider.  Check your puncture siteevery day for signs of infection. Check for: ? More redness, swelling, or pain. ? More fluid or blood. ? Warmth. ? Pus or a bad smell.  Return to your normal activities as told by your health care provider. Ask your health care provider what activities are safe for you.  Do not drive for 24 hours if you were given a medicine to help you relax (sedative).  Keep all follow-up visits as told by your health care provider. This is important. Contact a health care provider if:  You have more redness, swelling, or pain around the puncture site.  You have more fluid or blood coming from the puncture site.  Your puncture site feels warm to the touch.  You have pus or a bad smell coming from the puncture site.  You have a fever.  Your pain is not controlled with medicine. This information is not intended to replace advice given to you by your health care provider. Make sure  you discuss any questions you have with your health care provider. Document Released: 01/02/2005 Document Revised: 01/03/2016 Document Reviewed: 11/27/2015 Elsevier Interactive Patient Education  2018 [employer]. Moderate Conscious Sedation, Adult, Care After These instructions provide you with information about caring for yourself after your procedure. Your health care provider may also give you more specific instructions. Your treatment has been planned according to current medical practices, but problems sometimes occur. Call your health care provider if you have any problems or questions after your procedure. What can I expect after the procedure? After your procedure, it is common:  To feel sleepy for several hours.  To feel clumsy and have poor balance for several hours.  To have poor judgment for several hours.  To vomit if you eat too soon.  Follow these instructions at home: For at least 24 hours after the procedure:   Do not: ? Participate in activities where you could fall or become injured. ? Drive. ? Use heavy machinery. ? Drink alcohol. ? Take sleeping pills or medicines that cause drowsiness. ? Make important decisions or sign legal documents. ? Take care of children on your own.  Rest. Eating and drinking  Follow the diet recommended by your health care provider.  If you vomit: ? Drink water, juice, or soup when you can drink without vomiting. ? Make sure you have little or no nausea before eating solid foods. General instructions  Have a responsible adult stay with you until you are  awake and alert.  Take over-the-counter and prescription medicines only as told by your health care provider.  If you smoke, do not smoke without supervision.  Keep all follow-up visits as told by your health care provider. This is important. Contact a health care provider if:  You keep feeling nauseous or you keep vomiting.  You feel light-headed.  You develop a  rash.  You have a fever. Get help right away if:  You have trouble breathing. This information is not intended to replace advice given to you by your health care provider. Make sure you discuss any questions you have with your health care provider. Document Released: 04/05/2013 Document Revised: 11/18/2015 Document Reviewed: 10/05/2015 Elsevier Interactive Patient Education  [employer].

## 2018-03-15 NOTE — Procedures (Signed)
MDS  S/p RT ILIAC BM ASP AND CORE  No comp Stable EBL 0 Path pending Full report in pacs

## 2018-03-15 NOTE — Progress Notes (Signed)
[facility]                                                                              Patient Demographics  Katelyn Cline, is a 57 y.o. female, DOB - [birth year], ZOX:[id number]  Admit date - 03/11/2018   Admitting Physician Nita Sells, MD  Outpatient Primary MD for the patient is [facility], [place] Nunnery, MD  Outpatient specialists:   LOS - 4  days   Medical records reviewed and are as summarized below:    Chief Complaint  Patient presents with  . Abnormal Lab  . Weakness       Brief summary   Patient is a 57 year old female with history of metastatic melanoma, right lung resection, polycythemia vera, lumpectomy of the bilateral breast, hypertension, neuropathy, diabetes type 2 presented to ED advised by her PCP  due to abnormal labs and weakness.  Patient had been treated with clindamycin for severe dental infection, in ED, was noted to have leukocytosis, thrombocytosis with predominant neutrophilia and increase in all cell lines. patient was admitted for further work-up.  Assessment & Plan    Principal Problem:   Leukocytosis in the setting of polycythemia vera -Ruled out sepsis, patient had a recent course of antibiotics for dental infection, then Diflucan x1 for subsequent vaginitis -Presented with a leukocytosis of 32 with neutrophilia -CT soft tissue neck, chest and abdomen and pelvis all negative. CT maxillofacial showed dental caries otherwise unremarkable -Respiratory virus panel negative, blood cultures negative so far, will check procalcitonin -Work-up so far negative, no fevers, continue IV vancomycin and cefepime.  -Hematology consulted as patient's WBC count continue to trend up, seen by [name], recommended CT-guided bone marrow biopsy today   Active Problems:    Tobacco use disorder -Patient counseled on smoking cessation    Essential hypertension, benign -BP stable, continue Norvasc, hydralazine     Polycythemia vera ([place]) -  JAK 2+ treated intermittently with phlebotomy,  -Heme-onc following, CT-guided bone marrow biopsy today. NPO    Diabetes mellitus type II, controlled ([place]) -Continue to hold metformin, Jardiance -CBGs fairly stable, continue sliding scale insulin    Chronic hypoxemic respiratory failure ([name]) -Continue O2 via nasal cannula  Acute kidney injury/hyperkalemia -Resolved, likely due to combination of AKI with ACE inhibitors and NSAID -DC IV fluids  Code Status: Full CODE STATUS DVT Prophylaxis:  Lovenox  Family Communication: Discussed in detail with the patient, all imaging results, lab results explained to the patient    Disposition Plan:   Time Spent in minutes 25 minutes  Procedures:  2D echo  Consultants:    Hematology oncology  Antimicrobials:      Medications  Scheduled Meds: . amLODipine  5 mg Oral Daily  . DULoxetine  120 mg Oral Daily  . enoxaparin (LOVENOX) injection  50 mg Subcutaneous Q24H  . fentaNYL      . hydrALAZINE  50 mg Oral Q8H  . insulin aspart  0-15 Units Subcutaneous TID WC  . insulin aspart  0-5 Units Subcutaneous QHS  . midazolam      . valACYclovir  1,000 mg Oral BID   Continuous Infusions: . sodium chloride  75 mL/hr at 03/15/18 1323  . ceFEPime (MAXIPIME) IV 2 g (03/15/18 1324)  . vancomycin Stopped (03/14/18 2319)   PRN Meds:.gabapentin, oxyCODONE-acetaminophen **AND** oxyCODONE, tiZANidine   Antibiotics   Anti-infectives (From admission, onward)   Start     Dose/Rate Route Frequency Ordered Stop   03/13/18 2200  vancomycin (VANCOCIN) IVPB 1000 mg/200 mL premix     1,000 mg 200 mL/hr over 60 Minutes Intravenous Every 12 hours 03/13/18 0924     03/13/18 1000  ceFEPIme (MAXIPIME) 2 g in sodium chloride 0.9 % 100 mL IVPB     2 g 200 mL/hr over 30 Minutes Intravenous Every 8 hours 03/13/18 0924     03/13/18 1000  vancomycin (VANCOCIN) 2,000 mg in sodium chloride 0.9 % 500 mL IVPB     2,000 mg 250 mL/hr over 120 Minutes  Intravenous  Once 03/13/18 0924 03/13/18 1413   03/11/18 2200  valACYclovir (VALTREX) tablet 1,000 mg     1,000 mg Oral 2 times daily 03/11/18 1802     03/11/18 1415  vancomycin (VANCOCIN) IVPB 1000 mg/200 mL premix     1,000 mg 200 mL/hr over 60 Minutes Intravenous  Once 03/11/18 1413 03/11/18 1646   03/11/18 1415  ceFEPIme (MAXIPIME) 2 g in sodium chloride 0.9 % 100 mL IVPB     2 g 200 mL/hr over 30 Minutes Intravenous  Once 03/11/18 1413 03/11/18 1507        Subjective:   Katelyn Cline was seen and examined today.  Feels somewhat better today, no fevers overnight.  Awaiting CT-guided biopsy today.  No chest pain, shortness of breath, abdominal pain, nausea vomiting or diarrhea.    Objective:   Vitals:   03/15/18 1155 03/15/18 1244 03/15/18 1303 03/15/18 1333  BP:  (!) 146/64 (!) 145/65 (!) 158/61  Pulse: 79 68 67 62  Resp: (!) '24 18 18 16  '$ Temp:  98.6 F (37 C) 98.3 F (36.8 C) 98.2 F (36.8 C)  TempSrc:  Oral Oral Oral  SpO2: (!) 88% 95% 98% 97%  Weight:      Height:        Intake/Output Summary (Last 24 hours) at 03/15/2018 1352 Last data filed at 03/15/2018 0900 Gross per 24 hour  Intake 3100.26 ml  Output -  Net 3100.26 ml     Wt Readings from Last 3 Encounters:  03/11/18 103.5 kg  03/10/18 103.9 kg  02/21/18 105.2 kg     Exam  Physical Exam  General: Alert and oriented x 3, NAD  Eyes:   HEENT:    Cardiovascular: S1 S2 auscultated,  Regular rate and rhythm. No pedal edema b/l  Respiratory: Clear to auscultation bilaterally, no wheezing, rales or rhonchi  Gastrointestinal: Soft, nontender, nondistended, + bowel sounds  Ext: no pedal edema bilaterally  Neuro: no new deficits  Musculoskeletal: No digital cyanosis, clubbing  Skin: No rashes  Psych: Normal affect and demeanor, alert and oriented x3     Data Reviewed:  I have personally reviewed following labs and imaging studies  Micro Results Recent Results (from the past 240  hour(s))  Herpes simplex virus culture     Status: Abnormal   Collection Time: 03/10/18 11:32 AM  Result Value Ref Range Status   MICRO NUMBER: [id number]  Final   SPECIMEN QUALITY: ADEQUATE  Final   Source OTHER (SPECIFY)  Final   STATUS: FINAL  Final   HSV CULTURE: human herpes simplex virus (A)  Final    Comment: Isolated  WET  PREP FOR [place], YEAST, CLUE     Status: None   Collection Time: 03/10/18 11:32 AM  Result Value Ref Range Status   Source: GENITAL  Final   RESULT   Final    Comment: TRICHOMONAS-NONE SEEN YEAST-NONE SEEN CLUE CELLS-NONE SEEN EPITHELIAL CELLS-PRESENT BACTERIA-MODERATE WBCS-FEW   Urine Culture     Status: None   Collection Time: 03/10/18 12:01 PM  Result Value Ref Range Status   MICRO NUMBER: [id number]  Final   SPECIMEN QUALITY: ADEQUATE  Final   Sample Source URINE  Final   STATUS: FINAL  Final   Result:   Final    Multiple organisms present, each less than 10,000 CFU/mL. These organisms, commonly found on external and internal genitalia, are considered to be colonizers. No further testing performed.  Blood culture (routine x 2)     Status: None (Preliminary result)   Collection Time: 03/11/18 12:25 PM  Result Value Ref Range Status   Specimen Description   Final    BLOOD RIGHT ARM Performed at [facility] [address] [place], [place] [address]    Special Requests   Final    BOTTLES DRAWN AEROBIC AND ANAEROBIC Blood Culture adequate volume Performed at [facility] [address] [place], [place] [address]    Culture   Final    NO GROWTH 4 DAYS Performed at [facility], [place] [address], [place], [place] [address]    Report Status PENDING  Incomplete  Blood culture (routine x 2)     Status: None (Preliminary result)   Collection Time: 03/11/18 12:34 PM  Result Value Ref Range Status   Specimen Description   Final    BLOOD BLOOD LEFT FOREARM Performed at [facility]  [address], [place], [place] [address]    Special Requests   Final    BOTTLES DRAWN AEROBIC AND ANAEROBIC Blood Culture results may not be optimal due to an excessive volume of blood received in culture bottles Performed at [facility] [address] [place], [place] [address]    Culture   Final    NO GROWTH 4 DAYS Performed at [facility], [place] [address], [place], [place] [address]    Report Status PENDING  Incomplete  Respiratory Panel by PCR     Status: None   Collection Time: 03/12/18  6:03 PM  Result Value Ref Range Status   Adenovirus NOT DETECTED NOT DETECTED Final   Coronavirus 229E NOT DETECTED NOT DETECTED Final   Coronavirus HKU1 NOT DETECTED NOT DETECTED Final   Coronavirus NL63 NOT DETECTED NOT DETECTED Final   Coronavirus OC43 NOT DETECTED NOT DETECTED Final   Metapneumovirus NOT DETECTED NOT DETECTED Final   Rhinovirus / Enterovirus NOT DETECTED NOT DETECTED Final   Influenza A NOT DETECTED NOT DETECTED Final   Influenza B NOT DETECTED NOT DETECTED Final   Parainfluenza Virus 1 NOT DETECTED NOT DETECTED Final   Parainfluenza Virus 2 NOT DETECTED NOT DETECTED Final   Parainfluenza Virus 3 NOT DETECTED NOT DETECTED Final   Parainfluenza Virus 4 NOT DETECTED NOT DETECTED Final   Respiratory Syncytial Virus NOT DETECTED NOT DETECTED Final   Bordetella pertussis NOT DETECTED NOT DETECTED Final   Chlamydophila pneumoniae NOT DETECTED NOT DETECTED Final   Mycoplasma pneumoniae NOT DETECTED NOT DETECTED Final    Comment: Performed at [facility], [place] [address], [place], [place] [address]    Radiology Reports Ct Abdomen  Pelvis Wo Contrast  Result Date: 03/11/2018 CLINICAL DATA:  Leukocytosis. Elevated potassium and BUN. Weakness for the past 2 weeks. Metastatic melanoma. EXAM: CT CHEST, ABDOMEN AND PELVIS WITHOUT CONTRAST TECHNIQUE: Multidetector CT imaging of the chest, abdomen and pelvis was performed following the standard protocol  without IV contrast. COMPARISON:  09/21/2017. FINDINGS: CT CHEST FINDINGS Cardiovascular: Atheromatous calcifications, including the coronary arteries and aorta. Mediastinum/Nodes: No enlarged mediastinal, hilar, or axillary lymph nodes. Thyroid gland, trachea, and esophagus demonstrate no significant findings. Lungs/Pleura: 7 mm left lower lobe nodule on image number 112 series 7, 8 mm previously. 5 mm left upper lobe nodule on image number 62 series 7, previously 5 mm. 6 mm subpleural nodule in the right middle lobe along the minor fissure on image number 76 series 7, previously 4 mm. This remains thin and plaque-like in the sagittal plane, unchanged in size in the sagittal plane. The previously demonstrated 3 mm right upper lobe nodule on the major fissure measures 5 mm on image number 64 series 7 today, also unchanged in the sagittal plane. No new nodules are seen. Musculoskeletal: Mild thoracic and lower cervical spine degenerative changes. CT ABDOMEN PELVIS FINDINGS Hepatobiliary: The liver remains mildly enlarged. No focal liver abnormality is seen. No gallstones, gallbladder wall thickening, or biliary dilatation. Pancreas: Unremarkable. No pancreatic ductal dilatation or surrounding inflammatory changes. Spleen: Normal in size without focal abnormality. Adrenals/Urinary Tract: The previously suspected small right renal cysts measures fluid density today. Otherwise, normal appearing adrenal glands, kidneys, ureters and urinary bladder. Stomach/Bowel: Multiple sigmoid colon diverticula. Mildly prominent stool. Normal appearing stomach, small bowel and appendix. Vascular/Lymphatic: Atheromatous arterial calcifications, including left renal artery branch calcifications peripherally. No enlarged lymph nodes. Reproductive: Status post hysterectomy. No adnexal masses. Other: Small umbilical hernia containing fat. Musculoskeletal: Mild lumbar spine degenerative changes. IMPRESSION: 1. No acute abnormality. 2.  Stable small bilateral lung nodules, as described above. 3. Stable hepatomegaly. 4. Sigmoid diverticulosis. 5. Atheromatous arterial calcifications, including the coronary arteries and aorta. Electronically Signed   By: Claudie Revering M.D.   On: 03/11/2018 15:42   Dg Chest 2 View  Result Date: 03/11/2018 CLINICAL DATA:  Weakness 2 weeks with elevated white blood cell count. Near syncope. EXAM: CHEST - 2 VIEW COMPARISON:  10/15/2013 FINDINGS: Lungs are adequately inflated without focal airspace consolidation or effusion. Linear scarring over the right upper lung unchanged. Subtle prominence of the perihilar markings which may be due to mild degree of vascular congestion versus viral bronchopneumonia. Cardiomediastinal silhouette and remainder the exam is unchanged. IMPRESSION: Subtle prominence of the perihilar markings which may be due to mild vascular congestion versus viral bronchopneumonia. Electronically Signed   By: Marin Olp M.D.   On: 03/11/2018 13:28   Ct Head Wo Contrast  Result Date: 03/11/2018 CLINICAL DATA:  Near syncopal episodes. EXAM: CT HEAD WITHOUT CONTRAST CT MAXILLOFACIAL WITHOUT CONTRAST TECHNIQUE: Multidetector CT imaging of the head and maxillofacial structures were performed using the standard protocol without intravenous contrast. Multiplanar CT image reconstructions of the maxillofacial structures were also generated. COMPARISON:  Brain MRI 09/19/2012 FINDINGS: CT HEAD FINDINGS Brain: No evidence of acute infarction, hemorrhage, hydrocephalus, extra-axial collection or mass lesion/mass effect. Vascular: No hyperdense vessel or unexpected calcification. Skull: Normal. Negative for fracture or focal lesion. Other: No scalp lesions or hematoma. CT MAXILLOFACIAL FINDINGS Osseous: No acute facial bone fractures or worrisome bone lesions. Orbits: No fracture. The globes are intact. No orbital mass. The extraocular muscles appear normal. Sinuses: Mild mucoperiosteal thickening involving  scattered ethmoid  sinuses. There is also a small mucous retention cyst or polyp in the floor of the left maxillary sinus. No findings for acute sinusitis. The mastoid air cells and middle ear cavities are clear. Soft tissues: No soft tissue mass or hematoma. Other: Fairly extensive dental caries are noted. IMPRESSION: 1. No acute intracranial findings or skull fracture. 2. No acute facial bone fractures or bone lesions. 3. No significant sinus disease. 4. Dental caries. Electronically Signed   By: Marijo Sanes M.D.   On: 03/11/2018 15:36   Ct Soft Tissue Neck Wo Contrast  Result Date: 03/11/2018 CLINICAL DATA:  Swelling of the neck.  Suspicion of thyroiditis. EXAM: CT NECK WITHOUT CONTRAST TECHNIQUE: Multidetector CT imaging of the neck was performed following the standard protocol without intravenous contrast. COMPARISON:  None. FINDINGS: Pharynx and larynx: No mucosal or submucosal lesion. Salivary glands: Parotid and submandibular glands are unremarkable. Thyroid: The thyroid gland does not appear enlarged or show any focal lesion. Lymph nodes: No adenopathy. Vascular: Ordinary atherosclerosis at the carotid bifurcations. Otherwise negative. No contrast utilized. Limited intracranial: See results of head CT. Visualized orbits: See results of facial CT. Mastoids and visualized paranasal sinuses: See results of facial CT. Skeleton: Ordinary cervical spondylosis and facet arthritis. Upper chest: Negative Other: None IMPRESSION: The thyroid gland does not appear enlarged or show any focal finding. This is a noncontrast study. No other neck pathology of clinical relevance. Electronically Signed   By: Nelson Chimes M.D.   On: 03/11/2018 15:34   Ct Chest Wo Contrast  Result Date: 03/11/2018 CLINICAL DATA:  Leukocytosis. Elevated potassium and BUN. Weakness for the past 2 weeks. Metastatic melanoma. EXAM: CT CHEST, ABDOMEN AND PELVIS WITHOUT CONTRAST TECHNIQUE: Multidetector CT imaging of the chest, abdomen and  pelvis was performed following the standard protocol without IV contrast. COMPARISON:  09/21/2017. FINDINGS: CT CHEST FINDINGS Cardiovascular: Atheromatous calcifications, including the coronary arteries and aorta. Mediastinum/Nodes: No enlarged mediastinal, hilar, or axillary lymph nodes. Thyroid gland, trachea, and esophagus demonstrate no significant findings. Lungs/Pleura: 7 mm left lower lobe nodule on image number 112 series 7, 8 mm previously. 5 mm left upper lobe nodule on image number 62 series 7, previously 5 mm. 6 mm subpleural nodule in the right middle lobe along the minor fissure on image number 76 series 7, previously 4 mm. This remains thin and plaque-like in the sagittal plane, unchanged in size in the sagittal plane. The previously demonstrated 3 mm right upper lobe nodule on the major fissure measures 5 mm on image number 64 series 7 today, also unchanged in the sagittal plane. No new nodules are seen. Musculoskeletal: Mild thoracic and lower cervical spine degenerative changes. CT ABDOMEN PELVIS FINDINGS Hepatobiliary: The liver remains mildly enlarged. No focal liver abnormality is seen. No gallstones, gallbladder wall thickening, or biliary dilatation. Pancreas: Unremarkable. No pancreatic ductal dilatation or surrounding inflammatory changes. Spleen: Normal in size without focal abnormality. Adrenals/Urinary Tract: The previously suspected small right renal cysts measures fluid density today. Otherwise, normal appearing adrenal glands, kidneys, ureters and urinary bladder. Stomach/Bowel: Multiple sigmoid colon diverticula. Mildly prominent stool. Normal appearing stomach, small bowel and appendix. Vascular/Lymphatic: Atheromatous arterial calcifications, including left renal artery branch calcifications peripherally. No enlarged lymph nodes. Reproductive: Status post hysterectomy. No adnexal masses. Other: Small umbilical hernia containing fat. Musculoskeletal: Mild lumbar spine degenerative  changes. IMPRESSION: 1. No acute abnormality. 2. Stable small bilateral lung nodules, as described above. 3. Stable hepatomegaly. 4. Sigmoid diverticulosis. 5. Atheromatous arterial calcifications, including the coronary arteries and  aorta. Electronically Signed   By: Claudie Revering M.D.   On: 03/11/2018 15:42   Ct Biopsy  Result Date: 03/15/2018 INDICATION: Mild dysplastic syndrome, leukocytosis EXAM: CT GUIDED RIGHT ILIAC BONE MARROW ASPIRATION AND CORE BIOPSY Date:  03/15/2018 03/15/2018 12:19 pm Radiologist:  Jerilynn Mages. Daryll Brod, MD Guidance:  CT FLUOROSCOPY TIME:  Fluoroscopy Time: NONE. MEDICATIONS: 1% lidocaine local ANESTHESIA/SEDATION: 3.0 mg IV Versed; 100 mcg IV Fentanyl Moderate Sedation Time:  12 minutes The patient was continuously monitored during the procedure by the interventional radiology nurse under my direct supervision. CONTRAST:  None. COMPLICATIONS: None PROCEDURE: Informed consent was obtained from the patient following explanation of the procedure, risks, benefits and alternatives. The patient understands, agrees and consents for the procedure. All questions were addressed. A time out was performed. The patient was positioned prone and non-contrast localization CT was performed of the pelvis to demonstrate the iliac marrow spaces. Maximal barrier sterile technique utilized including caps, mask, sterile gowns, sterile gloves, large sterile drape, hand hygiene, and Betadine prep. Under sterile conditions and local anesthesia, an 11 gauge coaxial bone biopsy needle was advanced into the right iliac marrow space. Needle position was confirmed with CT imaging. Initially, bone marrow aspiration was performed. Next, the 11 gauge outer cannula was utilized to obtain a right iliac bone marrow core biopsy. Needle was removed. Hemostasis was obtained with compression. The patient tolerated the procedure well. Samples were prepared with the cytotechnologist. No immediate complications. IMPRESSION: CT  guided right iliac bone marrow aspiration and core biopsy. Electronically Signed   By: Jerilynn Mages.  Shick M.D.   On: 03/15/2018 12:23   Ct Bone Marrow Biopsy & Aspiration  Result Date: 03/15/2018 INDICATION: Mild dysplastic syndrome, leukocytosis EXAM: CT GUIDED RIGHT ILIAC BONE MARROW ASPIRATION AND CORE BIOPSY Date:  03/15/2018 03/15/2018 12:19 pm Radiologist:  Jerilynn Mages. Daryll Brod, MD Guidance:  CT FLUOROSCOPY TIME:  Fluoroscopy Time: NONE. MEDICATIONS: 1% lidocaine local ANESTHESIA/SEDATION: 3.0 mg IV Versed; 100 mcg IV Fentanyl Moderate Sedation Time:  12 minutes The patient was continuously monitored during the procedure by the interventional radiology nurse under my direct supervision. CONTRAST:  None. COMPLICATIONS: None PROCEDURE: Informed consent was obtained from the patient following explanation of the procedure, risks, benefits and alternatives. The patient understands, agrees and consents for the procedure. All questions were addressed. A time out was performed. The patient was positioned prone and non-contrast localization CT was performed of the pelvis to demonstrate the iliac marrow spaces. Maximal barrier sterile technique utilized including caps, mask, sterile gowns, sterile gloves, large sterile drape, hand hygiene, and Betadine prep. Under sterile conditions and local anesthesia, an 11 gauge coaxial bone biopsy needle was advanced into the right iliac marrow space. Needle position was confirmed with CT imaging. Initially, bone marrow aspiration was performed. Next, the 11 gauge outer cannula was utilized to obtain a right iliac bone marrow core biopsy. Needle was removed. Hemostasis was obtained with compression. The patient tolerated the procedure well. Samples were prepared with the cytotechnologist. No immediate complications. IMPRESSION: CT guided right iliac bone marrow aspiration and core biopsy. Electronically Signed   By: Jerilynn Mages.  Shick M.D.   On: 03/15/2018 12:23   Ct Maxillofacial Wo  Contrast  Result Date: 03/11/2018 CLINICAL DATA:  Near syncopal episodes. EXAM: CT HEAD WITHOUT CONTRAST CT MAXILLOFACIAL WITHOUT CONTRAST TECHNIQUE: Multidetector CT imaging of the head and maxillofacial structures were performed using the standard protocol without intravenous contrast. Multiplanar CT image reconstructions of the maxillofacial structures were also generated. COMPARISON:  Brain MRI  09/19/2012 FINDINGS: CT HEAD FINDINGS Brain: No evidence of acute infarction, hemorrhage, hydrocephalus, extra-axial collection or mass lesion/mass effect. Vascular: No hyperdense vessel or unexpected calcification. Skull: Normal. Negative for fracture or focal lesion. Other: No scalp lesions or hematoma. CT MAXILLOFACIAL FINDINGS Osseous: No acute facial bone fractures or worrisome bone lesions. Orbits: No fracture. The globes are intact. No orbital mass. The extraocular muscles appear normal. Sinuses: Mild mucoperiosteal thickening involving scattered ethmoid sinuses. There is also a small mucous retention cyst or polyp in the floor of the left maxillary sinus. No findings for acute sinusitis. The mastoid air cells and middle ear cavities are clear. Soft tissues: No soft tissue mass or hematoma. Other: Fairly extensive dental caries are noted. IMPRESSION: 1. No acute intracranial findings or skull fracture. 2. No acute facial bone fractures or bone lesions. 3. No significant sinus disease. 4. Dental caries. Electronically Signed   By: Marijo Sanes M.D.   On: 03/11/2018 15:36    Lab Data:  CBC: Recent Labs  Lab 03/10/18 1220 03/11/18 1209 03/12/18 0437 03/13/18 0452 03/14/18 0433 03/15/18 0826  WBC 32.0* 32.7* 26.7* 29.9* 31.0* 26.9*  NEUTROABS 26,688*  --   --   --   --  23.4*  HGB 15.1 14.6 13.9 13.7 13.7 13.1  HCT 51.8* 47.7* 47.2* 47.3* 47.1* 45.3  MCV 65.0* 66.6* 66.9* 67.7* 67.6* 67.8*  PLT 793* 649* 587* 579* 542* 580*   Basic Metabolic Panel: Recent Labs  Lab 03/11/18 1209  03/12/18 0437 03/13/18 0452 03/14/18 0433 03/15/18 0826  NA 140 139 142 145 143  K 5.5* 5.6* 4.8 4.6 4.1  CL 105 103 103 108 108  CO2 '25 27 30 26 27  '$ GLUCOSE 155* 116* 127* 138* 142*  BUN 32* 21* '18 15 13  '$ CREATININE 0.79 0.71 0.66 0.56 0.48  CALCIUM 9.0 8.4* 9.3 9.4 9.1   GFR: Estimated Creatinine Clearance: 95.3 mL/min (by C-G formula based on SCr of 0.48 mg/dL). Liver Function Tests: Recent Labs  Lab 03/10/18 1220 03/12/18 0437  AST 11 44*  ALT 14 17  ALKPHOS  --  122  BILITOT 0.3 1.7*  PROT 7.3 6.5  ALBUMIN  --  3.2*   No results for input(s): LIPASE, AMYLASE in the last 168 hours. No results for input(s): AMMONIA in the last 168 hours. Coagulation Profile: Recent Labs  Lab 03/12/18 0437  INR 1.00   Cardiac Enzymes: No results for input(s): CKTOTAL, CKMB, CKMBINDEX, TROPONINI in the last 168 hours. BNP (last 3 results) No results for input(s): PROBNP in the last 8760 hours. HbA1C: No results for input(s): HGBA1C in the last 72 hours. CBG: Recent Labs  Lab 03/14/18 1152 03/14/18 1610 03/14/18 2112 03/15/18 0747 03/15/18 1237  GLUCAP 167* 149* 191* 123* 118*   Lipid Profile: No results for input(s): CHOL, HDL, LDLCALC, TRIG, CHOLHDL, LDLDIRECT in the last 72 hours. Thyroid Function Tests: No results for input(s): TSH, T4TOTAL, FREET4, T3FREE, THYROIDAB in the last 72 hours. Anemia Panel: Recent Labs    03/14/18 1559  FERRITIN 71   Urine analysis:    Component Value Date/Time   COLORURINE STRAW (A) 03/12/2018 0142   APPEARANCEUR CLEAR 03/12/2018 0142   LABSPEC 1.014 03/12/2018 0142   PHURINE 5.0 03/12/2018 0142   GLUCOSEU >=500 (A) 03/12/2018 0142   HGBUR NEGATIVE 03/12/2018 0142   BILIRUBINUR NEGATIVE 03/12/2018 0142   KETONESUR NEGATIVE 03/12/2018 0142   PROTEINUR NEGATIVE 03/12/2018 0142   UROBILINOGEN 0.2 02/14/2014 1023   NITRITE NEGATIVE 03/12/2018 0142  LEUKOCYTESUR NEGATIVE 03/12/2018 0142     Dejay Kronk M.D. [facility] 03/15/2018, 1:52 PM  Pager: [phone] Between 7am to 7pm - call Pager - [phone]  After 7pm go to [email or web address] - password [email or web address]  Call night coverage person covering after 7pm

## 2018-03-16 DIAGNOSIS — I10 Essential (primary) hypertension: Secondary | ICD-10-CM

## 2018-03-16 DIAGNOSIS — G894 Chronic pain syndrome: Secondary | ICD-10-CM

## 2018-03-16 DIAGNOSIS — D72828 Other elevated white blood cell count: Secondary | ICD-10-CM

## 2018-03-16 DIAGNOSIS — D45 Polycythemia vera: Principal | ICD-10-CM

## 2018-03-16 DIAGNOSIS — F17200 Nicotine dependence, unspecified, uncomplicated: Secondary | ICD-10-CM

## 2018-03-16 DIAGNOSIS — J9611 Chronic respiratory failure with hypoxia: Secondary | ICD-10-CM

## 2018-03-16 DIAGNOSIS — R0989 Other specified symptoms and signs involving the circulatory and respiratory systems: Secondary | ICD-10-CM

## 2018-03-16 DIAGNOSIS — E119 Type 2 diabetes mellitus without complications: Secondary | ICD-10-CM

## 2018-03-16 DIAGNOSIS — K029 Dental caries, unspecified: Secondary | ICD-10-CM

## 2018-03-16 LAB — CULTURE, BLOOD (ROUTINE X 2)
CULTURE: NO GROWTH
CULTURE: NO GROWTH
SPECIAL REQUESTS: ADEQUATE

## 2018-03-16 LAB — BASIC METABOLIC PANEL
Anion gap: 9 (ref 5–15)
BUN: 15 mg/dL (ref 6–20)
CO2: 25 mmol/L (ref 22–32)
Calcium: 9 mg/dL (ref 8.9–10.3)
Chloride: 108 mmol/L (ref 98–111)
Creatinine, Ser: 0.46 mg/dL (ref 0.44–1.00)
GFR calc Af Amer: >60 mL/min (ref >60)
Glucose, Bld: 156 mg/dL — ABNORMAL HIGH (ref 70–99)
POTASSIUM: 3.8 mmol/L (ref 3.5–5.1)
SODIUM: 142 mmol/L (ref 135–145)

## 2018-03-16 LAB — GLUCOSE, CAPILLARY
Glucose-Capillary: 156 mg/dL — ABNORMAL HIGH (ref 70–99)
Glucose-Capillary: 166 mg/dL — ABNORMAL HIGH (ref 70–99)

## 2018-03-16 LAB — CBC
HEMATOCRIT: 41.7 % (ref 36.0–46.0)
HEMOGLOBIN: 12 g/dL (ref 12.0–15.0)
MCH: 19.4 pg — ABNORMAL LOW (ref 26.0–34.0)
MCHC: 28.8 g/dL — ABNORMAL LOW (ref 30.0–36.0)
MCV: 67.5 fL — ABNORMAL LOW (ref 78.0–100.0)
Platelets: 483 10*3/uL — ABNORMAL HIGH (ref 150–400)
RBC: 6.18 MIL/uL — ABNORMAL HIGH (ref 3.87–5.11)
RDW: 22.4 % — AB (ref 11.5–15.5)
WBC: 23.1 10*3/uL — ABNORMAL HIGH (ref 4.0–10.5)

## 2018-03-16 NOTE — Progress Notes (Signed)
Oncology short note  Patient had bone marrow biopsy done today.  . CBC Latest Ref Rng & Units 03/15/2018 03/14/2018 03/13/2018  WBC 4.0 - 10.5 K/uL 26.9(H) 31.0(H) 29.9(H)  Hemoglobin 12.0 - 15.0 g/dL 13.1 13.7 13.7  Hematocrit 36.0 - 46.0 % 45.3 47.1(H) 47.3(H)  Platelets 150 - 400 K/uL 544(H) 542(H) 579(H)   WBC counts better.  -We shall setup patient to f/u in clinic with me in 1 week to f/u on bone marrow results -patient okay to discharge from hematology standpoint if no evidence if active infection.  Sullivan Lone MD MS

## 2018-03-16 NOTE — Discharge Summary (Signed)
Physician Discharge Summary  Katelyn Cline:[id number] DOB: 1960-11-05 DOA: 03/11/2018  PCP: [name], MD  Admit date: 03/11/2018 Discharge date: 03/16/2018  Time spent: 45 minutes  Recommendations for Outpatient Follow-up:  Patient will be discharged to home.  Patient will need to follow up with primary care provider within one week of discharge.  Follow up with [name], oncologist, in one week. Patient should continue medications as prescribed.  Patient should follow a heart healthy/carb modified diet.   Discharge Diagnoses:  Leukocytosis in the setting of polycythemia vera Essential hypertension Polycythemia vera Diabetes mellitus, type II Chronic hypoxemic respiratory failure Acute kidney injury with hyperkalemia Tobacco abuse HSV  Discharge Condition: Stable  Diet recommendation: Heart healthy/carb modified  Filed Weights   03/11/18 1123  Weight: 103.5 kg    History of present illness:  On 03/11/2018 by Dr. Verneita Griffes 57 year old female metastatic melanoma BRAF negative, right lung resection 2014, Jak 2+ P vera for cell CA excision, lumpectomy of bilateral breast Other comorbid's include HTN, neuropathy, DM TY 2 poor control, depression/bipolar  Patient was given Rocephin 8/26 given clindamycin 3 times daily and told to follow-up with an old therapy which she did not-she also has not been checking her blood sugar and has chronic pain fibromyalgia peripheral neuropathy and degenerative disc disease etc etc Treated at PCP office with a week of vaginal swelling after wipingcontrolled and has been taking clindamycin for severe dental infection-she is also chronic smoker  Comes to the emergency room on the advice of her PA will obtain basic labs done are noted elevation of white count and platelet count with predominant neutrophilia and increase in all cell lines  CT abdomen pelvis showed no acute abnormality diverticulosis of neck showed no enlargement and  showed no intracranial findings but dental caries facial CT showed no anomaly  Hospital Course:  Leukocytosis in the setting of polycythemia vera -Sepsis was ruled out -Patient had a recent course of antibiotics for dental infection followed by Diflucan for subsequent vaginitis -She presented with leukocytosis of 32 with neutrophilia -CT soft tissue neck, chest and abdomen unremarkable. -CT maxillofacial showed dental caries otherwise unremarkable -Respiratory viral panel unremarkable -Blood cultures show no growth to date -Patient was placed on IV vancomycin and cefepime -Hematology oncology [name], consulted and appreciated.  -Interventional radiology consulted for CT-guided bone marrow biopsy.  Results pending, patient will follow up with [name] for results. -Leukocytosis trending downward, currently 23.1  Essential hypertension -Continue Norvasc, hydralazine  Polycythemia vera -Jak 2+ treated intermittently with phlebotomy -Hematology oncology consulted as above  Diabetes mellitus, type II -Continue Jardiance, metformin upon discharge  Chronic hypoxemic respiratory failure -Continue supplemental oxygen  Acute kidney injury with hyperkalemia -Resolved, suspect combination due to acute kidney injury along with ACE inhibitors and NSAID use -Patient was placed on IV fluids  Tobacco abuse -Smoking cessation discussed  HSV -diagnosed as an outpatient, was started on Valtrex.  Continue for an additional 3 days  Procedures: Echocardiogram CT-guided bone marrow biopsy  Consultations: Interventional radiology Hematology oncology  Discharge Exam: Vitals:   03/15/18 2059 03/16/18 0525  BP: 140/63 (!) 157/61  Pulse: 63 76  Resp: 20 20  Temp: 98.1 F (36.7 C) 98.8 F (37.1 C)  SpO2: 96% 98%     General: Well developed, well nourished, NAD, appears stated age  HEENT: NCAT, mucous membranes moist.  Neck: Supple  Cardiovascular: S1 S2 auscultated, RRR, no  murmur  Respiratory: Clear to auscultation bilaterally with equal chest rise  Abdomen: Soft, nontender, nondistended, + bowel sounds  Extremities: warm dry without cyanosis clubbing or edema  Neuro: AAOx3, nonfocal  Psych: Normal affect and demeanor with intact judgement and insight  Discharge Instructions Discharge Instructions    Discharge instructions   Complete by:  As directed    Patient will be discharged to home.  Patient will need to follow up with primary care provider within one week of discharge.  Follow up with [name], oncologist, in one week. Patient should continue medications as prescribed.  Patient should follow a heart healthy/carb modified diet.   Continue Valtrex for additional 3 days to complete your course of treatment.     Allergies as of 03/16/2018      Reactions   Contrast Media [iodinated Diagnostic Agents] Anaphylaxis   Erythromycin    Flagyl [metronidazole] Other (See Comments)   Generalized pain.   Latex Other (See Comments)   Was told to be careful because of Dye allergy   Other    Iodine Dye   Penicillins Other (See Comments)   Patient was an infant, no idea of reaction. Tolerates Keflex.   Tetracyclines & Related Other (See Comments)   GI side effects      Medication List    STOP taking these medications   Blood Glucose System Pak Kit   clindamycin 300 MG capsule Commonly known as:  CLEOCIN   diphenhydrAMINE 50 MG tablet Commonly known as:  BENADRYL   ibuprofen 800 MG tablet Commonly known as:  ADVIL,MOTRIN     TAKE these medications   amLODipine 5 MG tablet Commonly known as:  NORVASC TAKE 1 TABLET BY MOUTH EVERY DAY   atorvastatin 80 MG tablet Commonly known as:  LIPITOR Take 1 tablet (80 mg total) by mouth daily at 6 PM.   BLOOD GLUCOSE TEST STRIPS Strp Please dispense based on insurance and patient preference. Use as directed to monitor FSBS 1x daily. Dx: E11.9.   DULoxetine 60 MG capsule Commonly known as:   CYMBALTA TAKE 2 CAPSULES BY MOUTH EVERY DAY   empagliflozin 25 MG Tabs tablet Commonly known as:  JARDIANCE Take 25 mg by mouth daily.   fenofibrate 48 MG tablet Commonly known as:  TRICOR TAKE 1 TABLET(48 MG) BY MOUTH DAILY What changed:  See the new instructions.   fish oil-omega-3 fatty acids 1000 MG capsule Take 2 g by mouth 2 (two) times daily.   gabapentin 100 MG capsule Commonly known as:  NEURONTIN TAKE 1 TO 3 CAPSULES(100 TO 300 MG) BY MOUTH THREE TIMES DAILY AS NEEDED FOR NERVE PAIN What changed:  See the new instructions.   lisinopril 5 MG tablet Commonly known as:  PRINIVIL,ZESTRIL TAKE 1 TABLET BY MOUTH DAILY   metFORMIN 1000 MG tablet Commonly known as:  GLUCOPHAGE TAKE 1 TABLET BY MOUTH TWICE DAILY WITH A MEAL What changed:  See the new instructions.   MICROLET LANCETS Misc Please dispense based on patient and insurance preference. Use as directed to monitor  FSBS 1x daily. Dx: E11.9.   NYQUIL PO Take 1-2 capsules by mouth at bedtime as needed (for cold/flu).   oxyCODONE-acetaminophen 7.5-325 MG tablet Commonly known as:  PERCOCET Take 1 tablet by mouth 3 (three) times daily as needed for severe pain.   tiZANidine 4 MG tablet Commonly known as:  ZANAFLEX Take 1 tablet (4 mg total) by mouth 2 (two) times daily as needed for muscle spasms.   triamterene-hydrochlorothiazide 37.5-25 MG capsule Commonly known as:  DYAZIDE TAKE 1 CAPSULE BY  MOUTH EVERY MORNING What changed:  when to take this   valACYclovir 1000 MG tablet Commonly known as:  VALTREX Take 1 tablet (1,000 mg total) by mouth 2 (two) times daily.      Allergies  Allergen Reactions  . Contrast Media [Iodinated Diagnostic Agents] Anaphylaxis  . Erythromycin   . Flagyl [Metronidazole] Other (See Comments)    Generalized pain.  . Latex Other (See Comments)    Was told to be careful because of Dye allergy  . Other     Iodine Dye  . Penicillins Other (See Comments)    Patient was an  infant, no idea of reaction. Tolerates Keflex.  . Tetracyclines & Related Other (See Comments)    GI side effects   Follow-up Information    [place], Modena Nunnery, MD. Schedule an appointment as soon as possible for a visit in 1 week(s).   Specialty:  Family Medicine Why:  Hospital follow up Contact information: [address], [address] [place] [address] [phone]        [name], MD. Schedule an appointment as soon as possible for a visit in 1 week(s).   Specialties:  Hematology, Oncology Why:  Hospital follow up, bone marrow biopsy results Contact information: [place] [address] [phone]            The results of significant diagnostics from this hospitalization (including imaging, microbiology, ancillary and laboratory) are listed below for reference.    Significant Diagnostic Studies: Ct Abdomen Pelvis Wo Contrast  Result Date: 03/11/2018 CLINICAL DATA:  Leukocytosis. Elevated potassium and BUN. Weakness for the past 2 weeks. Metastatic melanoma. EXAM: CT CHEST, ABDOMEN AND PELVIS WITHOUT CONTRAST TECHNIQUE: Multidetector CT imaging of the chest, abdomen and pelvis was performed following the standard protocol without IV contrast. COMPARISON:  09/21/2017. FINDINGS: CT CHEST FINDINGS Cardiovascular: Atheromatous calcifications, including the coronary arteries and aorta. Mediastinum/Nodes: No enlarged mediastinal, hilar, or axillary lymph nodes. Thyroid gland, trachea, and esophagus demonstrate no significant findings. Lungs/Pleura: 7 mm left lower lobe nodule on image number 112 series 7, 8 mm previously. 5 mm left upper lobe nodule on image number 62 series 7, previously 5 mm. 6 mm subpleural nodule in the right middle lobe along the minor fissure on image number 76 series 7, previously 4 mm. This remains thin and plaque-like in the sagittal plane, unchanged in size in the sagittal plane. The previously demonstrated 3 mm right  upper lobe nodule on the major fissure measures 5 mm on image number 64 series 7 today, also unchanged in the sagittal plane. No new nodules are seen. Musculoskeletal: Mild thoracic and lower cervical spine degenerative changes. CT ABDOMEN PELVIS FINDINGS Hepatobiliary: The liver remains mildly enlarged. No focal liver abnormality is seen. No gallstones, gallbladder wall thickening, or biliary dilatation. Pancreas: Unremarkable. No pancreatic ductal dilatation or surrounding inflammatory changes. Spleen: Normal in size without focal abnormality. Adrenals/Urinary Tract: The previously suspected small right renal cysts measures fluid density today. Otherwise, normal appearing adrenal glands, kidneys, ureters and urinary bladder. Stomach/Bowel: Multiple sigmoid colon diverticula. Mildly prominent stool. Normal appearing stomach, small bowel and appendix. Vascular/Lymphatic: Atheromatous arterial calcifications, including left renal artery branch calcifications peripherally. No enlarged lymph nodes. Reproductive: Status post hysterectomy. No adnexal masses. Other: Small umbilical hernia containing fat. Musculoskeletal: Mild lumbar spine degenerative changes. IMPRESSION: 1. No acute abnormality. 2. Stable small bilateral lung nodules, as described above. 3. Stable hepatomegaly. 4. Sigmoid diverticulosis. 5. Atheromatous arterial calcifications, including the coronary arteries and  aorta. Electronically Signed   By: Claudie Revering M.D.   On: 03/11/2018 15:42   Dg Chest 2 View  Result Date: 03/11/2018 CLINICAL DATA:  Weakness 2 weeks with elevated white blood cell count. Near syncope. EXAM: CHEST - 2 VIEW COMPARISON:  10/15/2013 FINDINGS: Lungs are adequately inflated without focal airspace consolidation or effusion. Linear scarring over the right upper lung unchanged. Subtle prominence of the perihilar markings which may be due to mild degree of vascular congestion versus viral bronchopneumonia. Cardiomediastinal  silhouette and remainder the exam is unchanged. IMPRESSION: Subtle prominence of the perihilar markings which may be due to mild vascular congestion versus viral bronchopneumonia. Electronically Signed   By: Marin Olp M.D.   On: 03/11/2018 13:28   Ct Head Wo Contrast  Result Date: 03/11/2018 CLINICAL DATA:  Near syncopal episodes. EXAM: CT HEAD WITHOUT CONTRAST CT MAXILLOFACIAL WITHOUT CONTRAST TECHNIQUE: Multidetector CT imaging of the head and maxillofacial structures were performed using the standard protocol without intravenous contrast. Multiplanar CT image reconstructions of the maxillofacial structures were also generated. COMPARISON:  Brain MRI 09/19/2012 FINDINGS: CT HEAD FINDINGS Brain: No evidence of acute infarction, hemorrhage, hydrocephalus, extra-axial collection or mass lesion/mass effect. Vascular: No hyperdense vessel or unexpected calcification. Skull: Normal. Negative for fracture or focal lesion. Other: No scalp lesions or hematoma. CT MAXILLOFACIAL FINDINGS Osseous: No acute facial bone fractures or worrisome bone lesions. Orbits: No fracture. The globes are intact. No orbital mass. The extraocular muscles appear normal. Sinuses: Mild mucoperiosteal thickening involving scattered ethmoid sinuses. There is also a small mucous retention cyst or polyp in the floor of the left maxillary sinus. No findings for acute sinusitis. The mastoid air cells and middle ear cavities are clear. Soft tissues: No soft tissue mass or hematoma. Other: Fairly extensive dental caries are noted. IMPRESSION: 1. No acute intracranial findings or skull fracture. 2. No acute facial bone fractures or bone lesions. 3. No significant sinus disease. 4. Dental caries. Electronically Signed   By: Marijo Sanes M.D.   On: 03/11/2018 15:36   Ct Soft Tissue Neck Wo Contrast  Result Date: 03/11/2018 CLINICAL DATA:  Swelling of the neck.  Suspicion of thyroiditis. EXAM: CT NECK WITHOUT CONTRAST TECHNIQUE: Multidetector  CT imaging of the neck was performed following the standard protocol without intravenous contrast. COMPARISON:  None. FINDINGS: Pharynx and larynx: No mucosal or submucosal lesion. Salivary glands: Parotid and submandibular glands are unremarkable. Thyroid: The thyroid gland does not appear enlarged or show any focal lesion. Lymph nodes: No adenopathy. Vascular: Ordinary atherosclerosis at the carotid bifurcations. Otherwise negative. No contrast utilized. Limited intracranial: See results of head CT. Visualized orbits: See results of facial CT. Mastoids and visualized paranasal sinuses: See results of facial CT. Skeleton: Ordinary cervical spondylosis and facet arthritis. Upper chest: Negative Other: None IMPRESSION: The thyroid gland does not appear enlarged or show any focal finding. This is a noncontrast study. No other neck pathology of clinical relevance. Electronically Signed   By: Nelson Chimes M.D.   On: 03/11/2018 15:34   Ct Chest Wo Contrast  Result Date: 03/11/2018 CLINICAL DATA:  Leukocytosis. Elevated potassium and BUN. Weakness for the past 2 weeks. Metastatic melanoma. EXAM: CT CHEST, ABDOMEN AND PELVIS WITHOUT CONTRAST TECHNIQUE: Multidetector CT imaging of the chest, abdomen and pelvis was performed following the standard protocol without IV contrast. COMPARISON:  09/21/2017. FINDINGS: CT CHEST FINDINGS Cardiovascular: Atheromatous calcifications, including the coronary arteries and aorta. Mediastinum/Nodes: No enlarged mediastinal, hilar, or axillary lymph nodes. Thyroid gland, trachea, and  esophagus demonstrate no significant findings. Lungs/Pleura: 7 mm left lower lobe nodule on image number 112 series 7, 8 mm previously. 5 mm left upper lobe nodule on image number 62 series 7, previously 5 mm. 6 mm subpleural nodule in the right middle lobe along the minor fissure on image number 76 series 7, previously 4 mm. This remains thin and plaque-like in the sagittal plane, unchanged in size in the  sagittal plane. The previously demonstrated 3 mm right upper lobe nodule on the major fissure measures 5 mm on image number 64 series 7 today, also unchanged in the sagittal plane. No new nodules are seen. Musculoskeletal: Mild thoracic and lower cervical spine degenerative changes. CT ABDOMEN PELVIS FINDINGS Hepatobiliary: The liver remains mildly enlarged. No focal liver abnormality is seen. No gallstones, gallbladder wall thickening, or biliary dilatation. Pancreas: Unremarkable. No pancreatic ductal dilatation or surrounding inflammatory changes. Spleen: Normal in size without focal abnormality. Adrenals/Urinary Tract: The previously suspected small right renal cysts measures fluid density today. Otherwise, normal appearing adrenal glands, kidneys, ureters and urinary bladder. Stomach/Bowel: Multiple sigmoid colon diverticula. Mildly prominent stool. Normal appearing stomach, small bowel and appendix. Vascular/Lymphatic: Atheromatous arterial calcifications, including left renal artery branch calcifications peripherally. No enlarged lymph nodes. Reproductive: Status post hysterectomy. No adnexal masses. Other: Small umbilical hernia containing fat. Musculoskeletal: Mild lumbar spine degenerative changes. IMPRESSION: 1. No acute abnormality. 2. Stable small bilateral lung nodules, as described above. 3. Stable hepatomegaly. 4. Sigmoid diverticulosis. 5. Atheromatous arterial calcifications, including the coronary arteries and aorta. Electronically Signed   By: Claudie Revering M.D.   On: 03/11/2018 15:42   Ct Biopsy  Result Date: 03/15/2018 INDICATION: Mild dysplastic syndrome, leukocytosis EXAM: CT GUIDED RIGHT ILIAC BONE MARROW ASPIRATION AND CORE BIOPSY Date:  03/15/2018 03/15/2018 12:19 pm Radiologist:  Jerilynn Mages. Daryll Brod, MD Guidance:  CT FLUOROSCOPY TIME:  Fluoroscopy Time: NONE. MEDICATIONS: 1% lidocaine local ANESTHESIA/SEDATION: 3.0 mg IV Versed; 100 mcg IV Fentanyl Moderate Sedation Time:  12 minutes The  patient was continuously monitored during the procedure by the interventional radiology nurse under my direct supervision. CONTRAST:  None. COMPLICATIONS: None PROCEDURE: Informed consent was obtained from the patient following explanation of the procedure, risks, benefits and alternatives. The patient understands, agrees and consents for the procedure. All questions were addressed. A time out was performed. The patient was positioned prone and non-contrast localization CT was performed of the pelvis to demonstrate the iliac marrow spaces. Maximal barrier sterile technique utilized including caps, mask, sterile gowns, sterile gloves, large sterile drape, hand hygiene, and Betadine prep. Under sterile conditions and local anesthesia, an 11 gauge coaxial bone biopsy needle was advanced into the right iliac marrow space. Needle position was confirmed with CT imaging. Initially, bone marrow aspiration was performed. Next, the 11 gauge outer cannula was utilized to obtain a right iliac bone marrow core biopsy. Needle was removed. Hemostasis was obtained with compression. The patient tolerated the procedure well. Samples were prepared with the cytotechnologist. No immediate complications. IMPRESSION: CT guided right iliac bone marrow aspiration and core biopsy. Electronically Signed   By: Jerilynn Mages.  Shick M.D.   On: 03/15/2018 12:23   Ct Bone Marrow Biopsy & Aspiration  Result Date: 03/15/2018 INDICATION: Mild dysplastic syndrome, leukocytosis EXAM: CT GUIDED RIGHT ILIAC BONE MARROW ASPIRATION AND CORE BIOPSY Date:  03/15/2018 03/15/2018 12:19 pm Radiologist:  Jerilynn Mages. Daryll Brod, MD Guidance:  CT FLUOROSCOPY TIME:  Fluoroscopy Time: NONE. MEDICATIONS: 1% lidocaine local ANESTHESIA/SEDATION: 3.0 mg IV Versed; 100 mcg IV Fentanyl Moderate Sedation  Time:  12 minutes The patient was continuously monitored during the procedure by the interventional radiology nurse under my direct supervision. CONTRAST:  None. COMPLICATIONS: None  PROCEDURE: Informed consent was obtained from the patient following explanation of the procedure, risks, benefits and alternatives. The patient understands, agrees and consents for the procedure. All questions were addressed. A time out was performed. The patient was positioned prone and non-contrast localization CT was performed of the pelvis to demonstrate the iliac marrow spaces. Maximal barrier sterile technique utilized including caps, mask, sterile gowns, sterile gloves, large sterile drape, hand hygiene, and Betadine prep. Under sterile conditions and local anesthesia, an 11 gauge coaxial bone biopsy needle was advanced into the right iliac marrow space. Needle position was confirmed with CT imaging. Initially, bone marrow aspiration was performed. Next, the 11 gauge outer cannula was utilized to obtain a right iliac bone marrow core biopsy. Needle was removed. Hemostasis was obtained with compression. The patient tolerated the procedure well. Samples were prepared with the cytotechnologist. No immediate complications. IMPRESSION: CT guided right iliac bone marrow aspiration and core biopsy. Electronically Signed   By: Jerilynn Mages.  Shick M.D.   On: 03/15/2018 12:23   Ct Maxillofacial Wo Contrast  Result Date: 03/11/2018 CLINICAL DATA:  Near syncopal episodes. EXAM: CT HEAD WITHOUT CONTRAST CT MAXILLOFACIAL WITHOUT CONTRAST TECHNIQUE: Multidetector CT imaging of the head and maxillofacial structures were performed using the standard protocol without intravenous contrast. Multiplanar CT image reconstructions of the maxillofacial structures were also generated. COMPARISON:  Brain MRI 09/19/2012 FINDINGS: CT HEAD FINDINGS Brain: No evidence of acute infarction, hemorrhage, hydrocephalus, extra-axial collection or mass lesion/mass effect. Vascular: No hyperdense vessel or unexpected calcification. Skull: Normal. Negative for fracture or focal lesion. Other: No scalp lesions or hematoma. CT MAXILLOFACIAL FINDINGS  Osseous: No acute facial bone fractures or worrisome bone lesions. Orbits: No fracture. The globes are intact. No orbital mass. The extraocular muscles appear normal. Sinuses: Mild mucoperiosteal thickening involving scattered ethmoid sinuses. There is also a small mucous retention cyst or polyp in the floor of the left maxillary sinus. No findings for acute sinusitis. The mastoid air cells and middle ear cavities are clear. Soft tissues: No soft tissue mass or hematoma. Other: Fairly extensive dental caries are noted. IMPRESSION: 1. No acute intracranial findings or skull fracture. 2. No acute facial bone fractures or bone lesions. 3. No significant sinus disease. 4. Dental caries. Electronically Signed   By: Marijo Sanes M.D.   On: 03/11/2018 15:36    Microbiology: Recent Results (from the past 240 hour(s))  Herpes simplex virus culture     Status: Abnormal   Collection Time: 03/10/18 11:32 AM  Result Value Ref Range Status   MICRO NUMBER: [id number]  Final   SPECIMEN QUALITY: ADEQUATE  Final   Source OTHER (SPECIFY)  Final   STATUS: FINAL  Final   HSV CULTURE: human herpes simplex virus (A)  Final    Comment: Isolated  WET PREP FOR TRICH, YEAST, CLUE     Status: None   Collection Time: 03/10/18 11:32 AM  Result Value Ref Range Status   Source: GENITAL  Final   RESULT   Final    Comment: TRICHOMONAS-NONE SEEN YEAST-NONE SEEN CLUE CELLS-NONE SEEN EPITHELIAL CELLS-PRESENT BACTERIA-MODERATE WBCS-FEW   Urine Culture     Status: None   Collection Time: 03/10/18 12:01 PM  Result Value Ref Range Status   MICRO NUMBER: [id number]  Final   SPECIMEN QUALITY: ADEQUATE  Final   Sample Source URINE  Final  STATUS: FINAL  Final   Result:   Final    Multiple organisms present, each less than 10,000 CFU/mL. These organisms, commonly found on external and internal genitalia, are considered to be colonizers. No further testing performed.  Blood culture (routine x 2)     Status: None   Collection  Time: 03/11/18 12:25 PM  Result Value Ref Range Status   Specimen Description   Final    BLOOD RIGHT ARM Performed at [facility] [address], [place], [place] [address]    Special Requests   Final    BOTTLES DRAWN AEROBIC AND ANAEROBIC Blood Culture adequate volume Performed at [facility] [address], [place], [place] [address]    Culture   Final    NO GROWTH 5 DAYS Performed at [facility], [place] [address] [place], [place] [address]    Report Status 03/16/2018 FINAL  Final  Blood culture (routine x 2)     Status: None   Collection Time: 03/11/18 12:34 PM  Result Value Ref Range Status   Specimen Description   Final    BLOOD BLOOD LEFT FOREARM Performed at [facility] [address] [place], [place] [address]    Special Requests   Final    BOTTLES DRAWN AEROBIC AND ANAEROBIC Blood Culture results may not be optimal due to an excessive volume of blood received in culture bottles Performed at [facility] [address] [place], [place] [address]    Culture   Final    NO GROWTH 5 DAYS Performed at [facility], [place] [address] [place], [place] [address]    Report Status 03/16/2018 FINAL  Final  Respiratory Panel by PCR     Status: None   Collection Time: 03/12/18  6:03 PM  Result Value Ref Range Status   Adenovirus NOT DETECTED NOT DETECTED Final   Coronavirus 229E NOT DETECTED NOT DETECTED Final   Coronavirus HKU1 NOT DETECTED NOT DETECTED Final   Coronavirus NL63 NOT DETECTED NOT DETECTED Final   Coronavirus OC43 NOT DETECTED NOT DETECTED Final   Metapneumovirus NOT DETECTED NOT DETECTED Final   Rhinovirus / Enterovirus NOT DETECTED NOT DETECTED Final   Influenza A NOT DETECTED NOT DETECTED Final   Influenza B NOT DETECTED NOT DETECTED Final   Parainfluenza Virus 1 NOT DETECTED NOT DETECTED Final   Parainfluenza Virus 2 NOT DETECTED NOT DETECTED Final   Parainfluenza  Virus 3 NOT DETECTED NOT DETECTED Final   Parainfluenza Virus 4 NOT DETECTED NOT DETECTED Final   Respiratory Syncytial Virus NOT DETECTED NOT DETECTED Final   Bordetella pertussis NOT DETECTED NOT DETECTED Final   Chlamydophila pneumoniae NOT DETECTED NOT DETECTED Final   Mycoplasma pneumoniae NOT DETECTED NOT DETECTED Final    Comment: Performed at [facility], [place] [address] [place], [place] [address]     Labs: Basic Metabolic Panel: Recent Labs  Lab 03/12/18 0437 03/13/18 0452 03/14/18 0433 03/15/18 0826 03/16/18 0352  NA 139 142 145 143 142  K 5.6* 4.8 4.6 4.1 3.8  CL 103 103 108 108 108  CO2 _0 GLUCOSE 116* 127* 138* 142* 156*  BUN 21* _1 CREATININE 0.71 0.66 0.56 0.48 0.46  CALCIUM 8.4* 9.3 9.4 9.1 9.0   Liver Function Tests: Recent Labs  Lab 03/10/18 1220 03/12/18 0437  AST 11 44*  ALT 14 17  ALKPHOS  --  122  BILITOT 0.3 1.7*  PROT 7.3 6.5  ALBUMIN  --  3.2*   No results for input(s): LIPASE, AMYLASE in the last 168 hours. No results for input(s): AMMONIA in the last 168 hours. CBC: Recent Labs  Lab 03/10/18 1220  03/12/18 0437 03/13/18 0452 03/14/18 0433 03/15/18 0826 03/16/18 0352  WBC 32.0*   < > 26.7* 29.9* 31.0* 26.9* 23.1*  NEUTROABS 26,688*  --   --   --   --  23.4*  --   HGB 15.1   < > 13.9 13.7 13.7 13.1 12.0  HCT 51.8*   < > 47.2* 47.3* 47.1* 45.3 41.7  MCV 65.0*   < > 66.9* 67.7* 67.6* 67.8* 67.5*  PLT 793*   < > 587* 579* 542* 544* 483*   < > = values in this interval not displayed.   Cardiac Enzymes: No results for input(s): CKTOTAL, CKMB, CKMBINDEX, TROPONINI in the last 168 hours. BNP: BNP (last 3 results) No results for input(s): BNP in the last 8760 hours.  ProBNP (last 3 results) No results for input(s): PROBNP in the last 8760 hours.  CBG: Recent Labs  Lab 03/15/18 0747 03/15/18 1237 03/15/18 1801 03/15/18 2055 03/16/18 0813  GLUCAP 123* 118* 183* 180* 156*        Signed:  Maryann Mikhail  [facility] 03/16/2018, 10:17 AM

## 2018-03-17 ENCOUNTER — Telehealth: Payer: Self-pay | Admitting: Hematology

## 2018-03-17 ENCOUNTER — Encounter: Payer: Self-pay | Admitting: Hematology

## 2018-03-17 NOTE — Telephone Encounter (Signed)
A hospital follow up appt has been scheduled for the pt to see [name] on 9/26 at 1130am. Pt aware to arrive 30 minutes early for 11am labs.

## 2018-03-23 ENCOUNTER — Encounter: Payer: Self-pay | Admitting: Family Medicine

## 2018-03-23 ENCOUNTER — Ambulatory Visit (INDEPENDENT_AMBULATORY_CARE_PROVIDER_SITE_OTHER): Payer: Medicare HMO | Admitting: Family Medicine

## 2018-03-23 VITALS — BP 170/70 | HR 86 | Temp 98.0°F | Resp 18 | Ht 68.0 in | Wt 227.0 lb

## 2018-03-23 DIAGNOSIS — A6004 Herpesviral vulvovaginitis: Secondary | ICD-10-CM

## 2018-03-23 DIAGNOSIS — J9611 Chronic respiratory failure with hypoxia: Secondary | ICD-10-CM | POA: Diagnosis not present

## 2018-03-23 DIAGNOSIS — N760 Acute vaginitis: Secondary | ICD-10-CM

## 2018-03-23 DIAGNOSIS — G894 Chronic pain syndrome: Secondary | ICD-10-CM | POA: Diagnosis not present

## 2018-03-23 DIAGNOSIS — Z09 Encounter for follow-up examination after completed treatment for conditions other than malignant neoplasm: Secondary | ICD-10-CM | POA: Diagnosis not present

## 2018-03-23 DIAGNOSIS — D72828 Other elevated white blood cell count: Secondary | ICD-10-CM | POA: Diagnosis not present

## 2018-03-23 MED ORDER — VALACYCLOVIR HCL 1 G PO TABS: 1000.0000 mg | ORAL_TABLET | Freq: Two times a day (BID) | ORAL | 2 refills | Status: AC

## 2018-03-23 MED ORDER — OXYCODONE-ACETAMINOPHEN 7.5-325 MG PO TABS: 1.0000 | ORAL_TABLET | Freq: Three times a day (TID) | ORAL | 0 refills | Status: DC | PRN

## 2018-03-23 MED ORDER — FLUCONAZOLE 150 MG PO TABS: 150.0000 mg | ORAL_TABLET | Freq: Once | ORAL | 0 refills | Status: AC

## 2018-03-23 NOTE — Progress Notes (Signed)
Patient ID: Katelyn Cline, female    DOB: 08-Oct-1960, 57 y.o.   MRN: [id number]  PCP: [name], MD  Chief Complaint  Patient presents with  . Hospitalization Follow-up    Pt fasting    Subjective:   Katelyn Cline is a 57 y.o. female, presents to clinic with CC of leukocytosis, generalized weakness, hyperkalemia and vaginitis comes in for OV after hospitalizations.  She had downtrending white blood cells at the time of discharge, she has follow-up tomorrow with oncology where they will review her bone marrow biopsy results.  She has blood work that she is doing tomorrow at their office so will not do blood work today.  Patient has vastly improved appetite and energy.  She has very mild vaginal itching.  All her cultures from hospitalization for blood cultures, urine cultures, viral respiratory panel all came back negative and she was cleared after being worked up and treated for possible sepsis.  She was pan scanned from her head to her pelvis, the absence of any dental infections abscesses sinus tracts or cellulitis, no noted new lymphadenopathy, has enlarged spleen.  Vaginal lesion swab which was done in this office for her doctor's visit did not note being positive for HSV.  She did receive Valtrex while she was in the hospital and was told to continue 3 more days of treatment when she got home.  She states she has not picked up the prescription from the pharmacy and she never did the remaining treatment.  She denies any vaginal pain that she had previously.  Vaginal itching is mild without discharge.    She complains of her baseline chronic pain all over the most of her joints and she requests a repeated handicap placard application be completed today.  She is due for refill on her chronic pain medication.  She states all other refills she will request through the pharmacy. Otherwise feels well and improved from last office visit, no other acute complaints or pain complaints no  other med refills or needs at this time.   Patient Active Problem List   Diagnosis Date Noted  . MPN (myeloproliferative neoplasm) ([place])   . Leukocytosis 03/12/2018  . Symptoms of upper respiratory infection (URI) 03/12/2018  . Dental caries 03/12/2018  . Aortic atherosclerosis ([place]) 02/21/2018  . Constipation 05/28/2015  . OE (otitis externa) 01/04/2015  . Seborrheic keratoses 08/24/2014  . PVC (premature ventricular contraction) 05/18/2014  . Gastroenteritis 02/15/2014  . Diabetes mellitus type II, controlled ([place]) 02/06/2013  . Chronic hypoxemic respiratory failure ([place]) 02/06/2013  . History of basal cell carcinoma 12/01/2012  . Posttraumatic stress disorder 12/01/2012  . History of malignant melanoma 10/18/2012  . Polycythemia vera ([place]) 08/05/2012  . Hyperlipidemia 04/09/2012  . Narcolepsy 04/09/2012  . Peripheral neuropathy 04/09/2012  . Tobacco use disorder 04/09/2012  . Obesity 04/09/2012  . Essential hypertension, benign 04/09/2012  . DDD (degenerative disc disease), lumbar 02/16/2012  . DDD (degenerative disc disease), cervical 02/16/2012  . Chronic pain disorder 02/16/2012  . Depression 02/16/2012  . Fibromyalgia 02/16/2012     Prior to Admission medications   Medication Sig Start Date End Date Taking? Authorizing Provider  amLODipine (NORVASC) 5 MG tablet TAKE 1 TABLET BY MOUTH EVERY DAY 01/07/18  Yes [place], Modena Nunnery, MD  atorvastatin (LIPITOR) 80 MG tablet Take 1 tablet (80 mg total) by mouth daily at 6 PM. 10/19/17  Yes [place], Modena Nunnery, MD  DULoxetine (CYMBALTA) 60 MG capsule TAKE 2 CAPSULES BY  MOUTH EVERY DAY 02/25/18  Yes [place], Modena Nunnery, MD  empagliflozin (JARDIANCE) 25 MG TABS tablet Take 25 mg by mouth daily. 10/19/17  Yes [place], Modena Nunnery, MD  fenofibrate (TRICOR) 48 MG tablet TAKE 1 TABLET(48 MG) BY MOUTH DAILY 01/07/18  Yes [place], Modena Nunnery, MD  fish oil-omega-3 fatty acids 1000 MG capsule Take 2 g by mouth 2 (two) times daily.   Yes [provider]  gabapentin (NEURONTIN) 100 MG capsule TAKE 1 TO 3 CAPSULES(100 TO 300 MG) BY MOUTH THREE TIMES DAILY AS NEEDED FOR NERVE PAIN 02/25/18  Yes [place], Modena Nunnery, MD  Glucose Blood (BLOOD GLUCOSE TEST STRIPS) STRP Please dispense based on insurance and patient preference. Use as directed to monitor FSBS 1x daily. Dx: E11.9. 10/12/17  Yes [place], Modena Nunnery, MD  lisinopril (PRINIVIL,ZESTRIL) 5 MG tablet TAKE 1 TABLET BY MOUTH DAILY 01/07/18  Yes [place], Modena Nunnery, MD  metFORMIN (GLUCOPHAGE) 1000 MG tablet TAKE 1 TABLET BY MOUTH TWICE DAILY WITH A MEAL 12/08/17  Yes [place], Modena Nunnery, MD  MICROLET LANCETS MISC Please dispense based on patient and insurance preference. Use as directed to monitor  FSBS 1x daily. Dx: E11.9. 10/12/17  Yes [place], Modena Nunnery, MD  oxyCODONE-acetaminophen (PERCOCET) 7.5-325 MG tablet Take 1 tablet by mouth 3 (three) times daily as needed for severe pain. 02/21/18  Yes [place], Modena Nunnery, MD  Pseudoeph-Doxylamine-DM-APAP (NYQUIL PO) Take 1-2 capsules by mouth at bedtime as needed (for cold/flu).   Yes [provider]  tiZANidine (ZANAFLEX) 4 MG tablet Take 1 tablet (4 mg total) by mouth 2 (two) times daily as needed for muscle spasms. 02/21/18  Yes [place], Modena Nunnery, MD  triamterene-hydrochlorothiazide (DYAZIDE) 37.5-25 MG capsule TAKE 1 CAPSULE BY MOUTH EVERY MORNING 12/24/17  Yes , Modena Nunnery, MD  valACYclovir (VALTREX) 1000 MG tablet Take 1 tablet (1,000 mg total) by mouth 2 (two) times daily. 03/10/18  Yes Delsa Grana, PA-C     Allergies  Allergen Reactions  . Contrast Media [Iodinated Diagnostic Agents] Anaphylaxis  . Erythromycin   . Flagyl [Metronidazole] Other (See Comments)    Generalized pain.  . Latex Other (See Comments)    Was told to be careful because of Dye allergy  . Other     Iodine Dye  . Penicillins Other (See Comments)    Patient was an infant, no idea of reaction. Tolerates Keflex.  . Tetracyclines & Related Other (See Comments)      GI side effects     Family History  Problem Relation Age of Onset  . Arthritis Mother   . Hyperlipidemia Mother   . Depression Mother   . Anxiety disorder Mother   . Dementia Mother   . Hypertension Father   . Hyperlipidemia Father   . Heart disease Father   . Stroke Father   . Dementia Father   . Heart disease Brother   . ADD / ADHD Son   . Alcohol abuse Maternal Grandfather   . Bipolar disorder Neg Hx   . Drug abuse Neg Hx   . OCD Neg Hx   . Paranoid behavior Neg Hx   . Schizophrenia Neg Hx   . Seizures Neg Hx   . Sexual abuse Neg Hx   . Physical abuse Neg Hx      Social History   Socioeconomic History  . Marital status: Divorced    Spouse name: Not on file  . Number of children: Not on file  . Years of education: 12+  .  Highest education level: Not on file  Occupational History  . Not on file  Social Needs  . Financial resource strain: Not on file  . Food insecurity:    Worry: Not on file    Inability: Not on file  . Transportation needs:    Medical: Not on file    Non-medical: Not on file  Tobacco Use  . Smoking status: Current Every Day Smoker    Packs/day: 1.00    Years: 10.00    Pack years: 10.00    Types: Cigarettes    Last attempt to quit: 09/16/2012    Years since quitting: 5.5  . Smokeless tobacco: Never Used  Substance and Sexual Activity  . Alcohol use: Yes    Comment: 1 -2 drinks a month, occasional  . Drug use: No  . Sexual activity: Not Currently  Lifestyle  . Physical activity:    Days per week: Not on file    Minutes per session: Not on file  . Stress: Not on file  Relationships  . Social connections:    Talks on phone: Not on file    Gets together: Not on file    Attends religious service: Not on file    Active member of club or organization: Not on file    Attends meetings of clubs or organizations: Not on file    Relationship status: Not on file  . Intimate partner violence:    Fear of current or ex partner: Not on  file    Emotionally abused: Not on file    Physically abused: Not on file    Forced sexual activity: Not on file  Other Topics Concern  . Not on file  Social History Narrative  . Not on file     Review of Systems  Constitutional: Negative.   HENT: Negative.   Eyes: Negative.   Respiratory: Negative.   Cardiovascular: Negative.   Gastrointestinal: Negative.   Endocrine: Negative.   Genitourinary: Negative.   Musculoskeletal: Negative.   Skin: Negative.   Allergic/Immunologic: Negative.   Neurological: Negative.   Hematological: Negative.   Psychiatric/Behavioral: Negative.   All other systems reviewed and are negative.      Objective:    Vitals:   03/23/18 0830  BP: (!) 170/70  Pulse: 86  Resp: 18  Temp: 98 F (36.7 C)  TempSrc: Oral  SpO2: 98%  Weight: 227 lb (103 kg)  Height: '5\' 8"'$  (1.727 m)      Physical Exam  Constitutional: She appears well-developed and well-nourished. No distress.  HENT:  Head: Normocephalic and atraumatic.  Nose: Nose normal.  Mouth/Throat: Oropharynx is clear and moist.  Eyes: Pupils are equal, round, and reactive to light. Conjunctivae and EOM are normal. Right eye exhibits no discharge. Left eye exhibits no discharge.  Neck: Normal range of motion. No tracheal deviation present.  Cardiovascular: Normal rate, regular rhythm, normal heart sounds and intact distal pulses. Exam reveals no gallop and no friction rub.  No murmur heard. Pulmonary/Chest: Effort normal. No stridor. No respiratory distress.  Abdominal: Soft. Bowel sounds are normal. She exhibits no distension. There is no tenderness.  Musculoskeletal: Normal range of motion.  Neurological: She is alert. She exhibits normal muscle tone. Coordination normal.  Skin: Skin is warm and dry. No rash noted. She is not diaphoretic. No erythema. No pallor.  Psychiatric: She has a normal mood and affect. Her behavior is normal.  Nursing note and vitals reviewed.  Assessment & Plan:      ICD-10-CM   1. Acute vaginitis N76.0 fluconazole (DIFLUCAN) 150 MG tablet   repeat diflucan x 1, topical monistat otherwise  2. Herpes simplex vulvovaginitis A60.04 valACYclovir (VALTREX) 1000 MG tablet   valtrex Rx with refills to take at the onset of recurrent symptoms Currently vaginal lesions and pain have resolved  3. Other elevated white blood cell (WBC) count V02.548    Seeing oncology tomorrow to review bone marrow biopsy results  4. Chronic hypoxemic respiratory failure ([name]) J96.11    Patient request a handicap placard, form completed  5. Chronic pain disorder G89.4 oxyCODONE-acetaminophen (PERCOCET) 7.5-325 MG tablet   For refill on chronic pain meds  6. Encounter for examination following treatment at hospital Z09     Pt's BP elevated here this am, has not taken meds yet, repeated prior to leaving clinic - noted to be improved systolic -[phone]   Delsa Grana, PA-C 03/23/18 8:46 AM

## 2018-03-23 NOTE — Progress Notes (Signed)
HEMATOLOGY/ONCOLOGY CONSULTATION NOTE  Date of Service: 03/24/2018  Patient Care Team: Buelah Manis, Modena Nunnery, MD as PCP - General (Family Medicine) Fanny Bien, MD as Attending Physician (Family Medicine) Everardo All, MD (Hematology and Oncology) [name], MD as Attending Physician (Cardiothoracic Surgery)  CHIEF COMPLAINTS/PURPOSE OF CONSULTATION:  Increasing Leukocytosis in the setting of h/o Jak2 mutation positive MPN  HISTORY OF PRESENTING ILLNESS:   Katelyn Cline is a wonderful 57 y.o. female who has been referred to [place] by Dr. Tana Coast  for evaluation and management of Leukocytosis. The pt reports that she is doing well overall.   The pt presented to the ED on 03/11/18 with significant weakness, hyperkalemia, and increasing leukocytosis with her PCP. She also notes that she had abscesses and facial infections about 4 weeks ago and received Clindamycin. She then developed a vaginal yeast infection, treated with Monistat and worsened to lesions and sores. The pt notes that since beginning antibiotics her cough has improved.   Patient report having a h/o melanoma metastatic to lung s/pwedge resection in 2014 . No recurrence since then. She notes she was been monitored with CT scans since then.  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  Patient notes that she previous needed therapeutic phlebotomy along time back but  denies ever taking medication for her polycythemia vera.   The pt notes that she used to sleep with a CPAP for many years. She then began treatment for her narcolepsy. The pt notes that she is on O2 and has remained on O2 since her surgery in 2014 to resect the melanoma. She has degenerative disc disease, fibromyalgia, and neuropathy in her feet.   The pt notes that she feels  less "groggy-headed" today.   Most recent lab results (03/14/18) of CBC and BMP is as follows: all values are WNL except for WBC at 31.0k, RBC at 6.97, HCT at 47.1, MCV at 67.6, MCH at 19.7, MCHC at 29.1, RDW at 22.3, PLT at 542k, Glucose at 138.  On review of systems, pt reports recent infections, feeling tired, moving her bowels well, resolved cough and denies abdominal pains, problems passing urine, and any other symptoms.   On PMHx the pt reports basal cell carcinoma in 2013, metastatic melanoma to lung wedge resected with thoracoscopy on 10/21/12.  Interval History:   Katelyn Cline returns today for management and evaluation of her polycythemia vera. I last saw the pt on 03/16/18. The pt reports that she is doing well overall.   The pt reports that she has been feeling a little better since discharge. She notes that in her previous care she had discussed the possibility of Hydroxyurea but never used this. The pt notes that she just began Excedrin because she was told to stop taking Ibuprofen. She continues to use Percocet to address her pain. She does not take '81mg'$  aspirin at this time.   Of note since the patient's last visit, pt has had BM Bx completed on 03/15/18 with results revealing results consistent with JAK2 positive polycythemia vera.  Lab results today (03/24/18) of CBC w/diff is as follows: all values are WNL except for WBC at 25.4k, RBC at 7.12, HCT at 47.6, MCV at 66.9, MCH at 19.7, MCHC at 29.4, RDW at 22.9, PLT at 725k, ANC at 21.7k.  On review  of systems, pt reports stable energy levels, and denies nose bleeds, gum bleeds, blood in the stools, abdominal pains, leg swelling, and any other symptoms.    MEDICAL HISTORY:  Past Medical History:  Diagnosis Date  . Agoraphobia   . Basal cell carcinoma   . Chronic pain   . Chronic respiratory failure with hypoxia ([name])    3L [place]  . DM type 2 (diabetes mellitus, type 2) ([place]) 10/16/2013  . Dysrhythmia    Hx: of palpitations "a  long time ago"  . Elevated hemoglobin ([place]) 2014  . Fibromyalgia   . History of UTI   . HTN (hypertension)   . Hyperlipidemia   . Major depression   . Metastatic melanoma ([place]) 10/18/2012   12 mm posterior right upper lobe pulmonary nodule, max SUV 3.0    . Migraines   . Miscarriage    x 4  . Narcolepsy   . Panic attacks   . Peripheral neuropathy   . Peripheral neuropathy    in feet  . Polycythemia secondary to smoking   . Polycythemia vera(238.4)   . Scoliosis   . Shortness of breath    Hx: of with activity  . Sleep apnea   . Vertigo     SURGICAL HISTORY: Past Surgical History:  Procedure Laterality Date  . BASAL CELL CARCINOMA EXCISION     flap surgery on face  . BREAST LUMPECTOMY     bilateral  . COLONOSCOPY W/ BIOPSIES AND POLYPECTOMY     Hx: of  . CYSTECTOMY     abdominal wall   . DILATION AND CURETTAGE OF UTERUS     Hx: of   . VAGINAL HYSTERECTOMY    . VIDEO ASSISTED THORACOSCOPY (VATS)/WEDGE RESECTION Right 10/21/2012   Procedure: VIDEO ASSISTED THORACOSCOPY (VATS)/WEDGE RESECTION;  Surgeon: [name], MD;  Location: [facility];  Service: Thoracic;  Laterality: Right;  Marland Kitchen VIDEO BRONCHOSCOPY N/A 10/21/2012   Procedure: VIDEO BRONCHOSCOPY;  Surgeon: [name], MD;  Location[facility];  Service: Thoracic;  Laterality: N/A;    SOCIAL HISTORY: Social History   Socioeconomic History  . Marital status: Divorced    Spouse name: Not on file  . Number of children: Not on file  . Years of education: 12+  . Highest education level: Not on file  Occupational History  . Not on file  Social Needs  . Financial resource strain: Not on file  . Food insecurity:    Worry: Not on file    Inability: Not on file  . Transportation needs:    Medical: Not on file    Non-medical: Not on file  Tobacco Use  . Smoking status: Current Every Day Smoker    Packs/day: 1.00    Years: 10.00    Pack years: 10.00    Types: Cigarettes    Last attempt to quit: 09/16/2012     Years since quitting: 5.5  . Smokeless tobacco: Never Used  Substance and Sexual Activity  . Alcohol use: Yes    Comment: 1 -2 drinks a month, occasional  . Drug use: No  . Sexual activity: Not Currently  Lifestyle  . Physical activity:    Days per week: Not on file    Minutes per session: Not on file  . Stress: Not on file  Relationships  . Social connections:    Talks on phone: Not on file    Gets together: Not on file    Attends religious service: Not on file  Active member of club or organization: Not on file    Attends meetings of clubs or organizations: Not on file    Relationship status: Not on file  . Intimate partner violence:    Fear of current or ex partner: Not on file    Emotionally abused: Not on file    Physically abused: Not on file    Forced sexual activity: Not on file  Other Topics Concern  . Not on file  Social History Narrative  . Not on file    FAMILY HISTORY: Family History  Problem Relation Age of Onset  . Arthritis Mother   . Hyperlipidemia Mother   . Depression Mother   . Anxiety disorder Mother   . Dementia Mother   . Hypertension Father   . Hyperlipidemia Father   . Heart disease Father   . Stroke Father   . Dementia Father   . Heart disease Brother   . ADD / ADHD Son   . Alcohol abuse Maternal Grandfather   . Bipolar disorder Neg Hx   . Drug abuse Neg Hx   . OCD Neg Hx   . Paranoid behavior Neg Hx   . Schizophrenia Neg Hx   . Seizures Neg Hx   . Sexual abuse Neg Hx   . Physical abuse Neg Hx     ALLERGIES:  is allergic to contrast media [iodinated diagnostic agents]; erythromycin; flagyl [metronidazole]; latex; other; penicillins; and tetracyclines & related.  MEDICATIONS:  Current Outpatient Medications  Medication Sig Dispense Refill  . amLODipine (NORVASC) 5 MG tablet TAKE 1 TABLET BY MOUTH EVERY DAY 90 tablet 0  . atorvastatin (LIPITOR) 80 MG tablet Take 1 tablet (80 mg total) by mouth daily at 6 PM. 90 tablet 3  .  DULoxetine (CYMBALTA) 60 MG capsule TAKE 2 CAPSULES BY MOUTH EVERY DAY 60 capsule 2  . empagliflozin (JARDIANCE) 25 MG TABS tablet Take 25 mg by mouth daily. 90 tablet 3  . fenofibrate (TRICOR) 48 MG tablet TAKE 1 TABLET(48 MG) BY MOUTH DAILY 90 tablet 0  . fish oil-omega-3 fatty acids 1000 MG capsule Take 2 g by mouth 2 (two) times daily.    Marland Kitchen gabapentin (NEURONTIN) 100 MG capsule TAKE 1 TO 3 CAPSULES(100 TO 300 MG) BY MOUTH THREE TIMES DAILY AS NEEDED FOR NERVE PAIN 90 capsule 2  . Glucose Blood (BLOOD GLUCOSE TEST STRIPS) STRP Please dispense based on insurance and patient preference. Use as directed to monitor FSBS 1x daily. Dx: E11.9. 100 each 3  . lisinopril (PRINIVIL,ZESTRIL) 5 MG tablet TAKE 1 TABLET BY MOUTH DAILY 90 tablet 1  . metFORMIN (GLUCOPHAGE) 1000 MG tablet TAKE 1 TABLET BY MOUTH TWICE DAILY WITH A MEAL 180 tablet 0  . MICROLET LANCETS MISC Please dispense based on patient and insurance preference. Use as directed to monitor  FSBS 1x daily. Dx: E11.9. 100 each 11  . oxyCODONE-acetaminophen (PERCOCET) 7.5-325 MG tablet Take 1 tablet by mouth 3 (three) times daily as needed for severe pain. 90 tablet 0  . Pseudoeph-Doxylamine-DM-APAP (NYQUIL PO) Take 1-2 capsules by mouth at bedtime as needed (for cold/flu).    Marland Kitchen tiZANidine (ZANAFLEX) 4 MG tablet Take 1 tablet (4 mg total) by mouth 2 (two) times daily as needed for muscle spasms. 60 tablet 2  . triamterene-hydrochlorothiazide (DYAZIDE) 37.5-25 MG capsule TAKE 1 CAPSULE BY MOUTH EVERY MORNING 90 capsule 0  . valACYclovir (VALTREX) 1000 MG tablet Take 1 tablet (1,000 mg total) by mouth 2 (two) times daily. 20 tablet  0  . valACYclovir (VALTREX) 1000 MG tablet Take 1 tablet (1,000 mg total) by mouth 2 (two) times daily for 5 days. 10 tablet 2   No current facility-administered medications for this visit.     REVIEW OF SYSTEMS:    A 10+ POINT REVIEW OF SYSTEMS WAS OBTAINED including neurology, dermatology, psychiatry, cardiac,  respiratory, lymph, extremities, GI, GU, Musculoskeletal, constitutional, breasts, reproductive, HEENT.  All pertinent positives are noted in the HPI.  All others are negative.   PHYSICAL EXAMINATION: ECOG PERFORMANCE STATUS: 2 - Symptomatic, <50% confined to bed  . Vitals:   03/24/18 1150  BP: (!) 120/55  Pulse: 77  Resp: 18  Temp: 98.2 F (36.8 C)  SpO2: (!) 89%   Filed Weights   03/24/18 1150  Weight: 226 lb 3.2 oz (102.6 kg)   .Body mass index is 34.39 kg/m.  GENERAL:alert, in no acute distress and comfortable SKIN: no acute rashes, no significant lesions EYES: conjunctiva are pink and non-injected, sclera anicteric OROPHARYNX: MMM, no exudates, no oropharyngeal erythema or ulceration NECK: supple, no JVD LYMPH:  no palpable lymphadenopathy in the cervical, axillary or inguinal regions LUNGS: clear to auscultation b/l with normal respiratory effort HEART: regular rate & rhythm ABDOMEN:  normoactive bowel sounds , non tender, not distended. Splenomegaly 3 fingerbreadths under costal margin   Extremity: no pedal edema PSYCH: alert & oriented x 3 with fluent speech NEURO: no focal motor/sensory deficits  ABDOMEN:  normoactive bowel sounds , non tender, not distended. Splenomegaly 3 fingerbreadths under costal margn  LABORATORY DATA:  I have reviewed the data as listed  . CBC Latest Ref Rng & Units 03/24/2018 03/16/2018 03/15/2018  WBC 3.9 - 10.3 K/uL 25.4(H) 23.1(H) 26.9(H)  Hemoglobin 11.6 - 15.9 g/dL 14.0 12.0 13.1  Hematocrit 34.8 - 46.6 % 47.6(H) 41.7 45.3  Platelets 145 - 400 K/uL 725(H) 483(H) 544(H)   . CBC    Component Value Date/Time   WBC 25.4 (H) 03/24/2018 1051   RBC 7.12 (H) 03/24/2018 1051   HGB 14.0 03/24/2018 1051   HCT 47.6 (H) 03/24/2018 1051   PLT 725 (H) 03/24/2018 1051   MCV 66.9 (L) 03/24/2018 1051   MCH 19.7 (L) 03/24/2018 1051   MCHC 29.4 (L) 03/24/2018 1051   RDW 22.9 (H) 03/24/2018 1051   LYMPHSABS 1.9 03/24/2018 1051   MONOABS 0.9  03/24/2018 1051   EOSABS 0.8 (H) 03/24/2018 1051   BASOSABS 0.1 03/24/2018 1051     . CMP Latest Ref Rng & Units 03/16/2018 03/15/2018 03/14/2018  Glucose 70 - 99 mg/dL 156(H) 142(H) 138(H)  BUN 6 - 20 mg/dL '15 13 15  '$ Creatinine 0.44 - 1.00 mg/dL 0.46 0.48 0.56  Sodium 135 - 145 mmol/L 142 143 145  Potassium 3.5 - 5.1 mmol/L 3.8 4.1 4.6  Chloride 98 - 111 mmol/L 108 108 108  CO2 22 - 32 mmol/L '25 27 26  '$ Calcium 8.9 - 10.3 mg/dL 9.0 9.1 9.4  Total Protein 6.5 - 8.1 g/dL - - -  Total Bilirubin 0.3 - 1.2 mg/dL - - -  Alkaline Phos 38 - 126 U/L - - -  AST 15 - 41 U/L - - -  ALT 0 - 44 U/L - - -   08/15/12 JAK2 Mutation:     03/15/18 BM Bx:      RADIOGRAPHIC STUDIES: I have personally reviewed the radiological images as listed and agreed with the findings in the report. Ct Abdomen Pelvis Wo Contrast  Result Date: 03/11/2018 CLINICAL DATA:  Leukocytosis. Elevated potassium and BUN. Weakness for the past 2 weeks. Metastatic melanoma. EXAM: CT CHEST, ABDOMEN AND PELVIS WITHOUT CONTRAST TECHNIQUE: Multidetector CT imaging of the chest, abdomen and pelvis was performed following the standard protocol without IV contrast. COMPARISON:  09/21/2017. FINDINGS: CT CHEST FINDINGS Cardiovascular: Atheromatous calcifications, including the coronary arteries and aorta. Mediastinum/Nodes: No enlarged mediastinal, hilar, or axillary lymph nodes. Thyroid gland, trachea, and esophagus demonstrate no significant findings. Lungs/Pleura: 7 mm left lower lobe nodule on image number 112 series 7, 8 mm previously. 5 mm left upper lobe nodule on image number 62 series 7, previously 5 mm. 6 mm subpleural nodule in the right middle lobe along the minor fissure on image number 76 series 7, previously 4 mm. This remains thin and plaque-like in the sagittal plane, unchanged in size in the sagittal plane. The previously demonstrated 3 mm right upper lobe nodule on the major fissure measures 5 mm on image number 64 series  7 today, also unchanged in the sagittal plane. No new nodules are seen. Musculoskeletal: Mild thoracic and lower cervical spine degenerative changes. CT ABDOMEN PELVIS FINDINGS Hepatobiliary: The liver remains mildly enlarged. No focal liver abnormality is seen. No gallstones, gallbladder wall thickening, or biliary dilatation. Pancreas: Unremarkable. No pancreatic ductal dilatation or surrounding inflammatory changes. Spleen: Normal in size without focal abnormality. Adrenals/Urinary Tract: The previously suspected small right renal cysts measures fluid density today. Otherwise, normal appearing adrenal glands, kidneys, ureters and urinary bladder. Stomach/Bowel: Multiple sigmoid colon diverticula. Mildly prominent stool. Normal appearing stomach, small bowel and appendix. Vascular/Lymphatic: Atheromatous arterial calcifications, including left renal artery branch calcifications peripherally. No enlarged lymph nodes. Reproductive: Status post hysterectomy. No adnexal masses. Other: Small umbilical hernia containing fat. Musculoskeletal: Mild lumbar spine degenerative changes. IMPRESSION: 1. No acute abnormality. 2. Stable small bilateral lung nodules, as described above. 3. Stable hepatomegaly. 4. Sigmoid diverticulosis. 5. Atheromatous arterial calcifications, including the coronary arteries and aorta. Electronically Signed   By: Claudie Revering M.D.   On: 03/11/2018 15:42   Dg Chest 2 View  Result Date: 03/11/2018 CLINICAL DATA:  Weakness 2 weeks with elevated white blood cell count. Near syncope. EXAM: CHEST - 2 VIEW COMPARISON:  10/15/2013 FINDINGS: Lungs are adequately inflated without focal airspace consolidation or effusion. Linear scarring over the right upper lung unchanged. Subtle prominence of the perihilar markings which may be due to mild degree of vascular congestion versus viral bronchopneumonia. Cardiomediastinal silhouette and remainder the exam is unchanged. IMPRESSION: Subtle prominence of the  perihilar markings which may be due to mild vascular congestion versus viral bronchopneumonia. Electronically Signed   By: Marin Olp M.D.   On: 03/11/2018 13:28   Ct Head Wo Contrast  Result Date: 03/11/2018 CLINICAL DATA:  Near syncopal episodes. EXAM: CT HEAD WITHOUT CONTRAST CT MAXILLOFACIAL WITHOUT CONTRAST TECHNIQUE: Multidetector CT imaging of the head and maxillofacial structures were performed using the standard protocol without intravenous contrast. Multiplanar CT image reconstructions of the maxillofacial structures were also generated. COMPARISON:  Brain MRI 09/19/2012 FINDINGS: CT HEAD FINDINGS Brain: No evidence of acute infarction, hemorrhage, hydrocephalus, extra-axial collection or mass lesion/mass effect. Vascular: No hyperdense vessel or unexpected calcification. Skull: Normal. Negative for fracture or focal lesion. Other: No scalp lesions or hematoma. CT MAXILLOFACIAL FINDINGS Osseous: No acute facial bone fractures or worrisome bone lesions. Orbits: No fracture. The globes are intact. No orbital mass. The extraocular muscles appear normal. Sinuses: Mild mucoperiosteal thickening involving scattered ethmoid sinuses. There is also a small mucous retention cyst or polyp  in the floor of the left maxillary sinus. No findings for acute sinusitis. The mastoid air cells and middle ear cavities are clear. Soft tissues: No soft tissue mass or hematoma. Other: Fairly extensive dental caries are noted. IMPRESSION: 1. No acute intracranial findings or skull fracture. 2. No acute facial bone fractures or bone lesions. 3. No significant sinus disease. 4. Dental caries. Electronically Signed   By: Marijo Sanes M.D.   On: 03/11/2018 15:36   Ct Soft Tissue Neck Wo Contrast  Result Date: 03/11/2018 CLINICAL DATA:  Swelling of the neck.  Suspicion of thyroiditis. EXAM: CT NECK WITHOUT CONTRAST TECHNIQUE: Multidetector CT imaging of the neck was performed following the standard protocol without  intravenous contrast. COMPARISON:  None. FINDINGS: Pharynx and larynx: No mucosal or submucosal lesion. Salivary glands: Parotid and submandibular glands are unremarkable. Thyroid: The thyroid gland does not appear enlarged or show any focal lesion. Lymph nodes: No adenopathy. Vascular: Ordinary atherosclerosis at the carotid bifurcations. Otherwise negative. No contrast utilized. Limited intracranial: See results of head CT. Visualized orbits: See results of facial CT. Mastoids and visualized paranasal sinuses: See results of facial CT. Skeleton: Ordinary cervical spondylosis and facet arthritis. Upper chest: Negative Other: None IMPRESSION: The thyroid gland does not appear enlarged or show any focal finding. This is a noncontrast study. No other neck pathology of clinical relevance. Electronically Signed   By: Nelson Chimes M.D.   On: 03/11/2018 15:34   Ct Chest Wo Contrast  Result Date: 03/11/2018 CLINICAL DATA:  Leukocytosis. Elevated potassium and BUN. Weakness for the past 2 weeks. Metastatic melanoma. EXAM: CT CHEST, ABDOMEN AND PELVIS WITHOUT CONTRAST TECHNIQUE: Multidetector CT imaging of the chest, abdomen and pelvis was performed following the standard protocol without IV contrast. COMPARISON:  09/21/2017. FINDINGS: CT CHEST FINDINGS Cardiovascular: Atheromatous calcifications, including the coronary arteries and aorta. Mediastinum/Nodes: No enlarged mediastinal, hilar, or axillary lymph nodes. Thyroid gland, trachea, and esophagus demonstrate no significant findings. Lungs/Pleura: 7 mm left lower lobe nodule on image number 112 series 7, 8 mm previously. 5 mm left upper lobe nodule on image number 62 series 7, previously 5 mm. 6 mm subpleural nodule in the right middle lobe along the minor fissure on image number 76 series 7, previously 4 mm. This remains thin and plaque-like in the sagittal plane, unchanged in size in the sagittal plane. The previously demonstrated 3 mm right upper lobe nodule on  the major fissure measures 5 mm on image number 64 series 7 today, also unchanged in the sagittal plane. No new nodules are seen. Musculoskeletal: Mild thoracic and lower cervical spine degenerative changes. CT ABDOMEN PELVIS FINDINGS Hepatobiliary: The liver remains mildly enlarged. No focal liver abnormality is seen. No gallstones, gallbladder wall thickening, or biliary dilatation. Pancreas: Unremarkable. No pancreatic ductal dilatation or surrounding inflammatory changes. Spleen: Normal in size without focal abnormality. Adrenals/Urinary Tract: The previously suspected small right renal cysts measures fluid density today. Otherwise, normal appearing adrenal glands, kidneys, ureters and urinary bladder. Stomach/Bowel: Multiple sigmoid colon diverticula. Mildly prominent stool. Normal appearing stomach, small bowel and appendix. Vascular/Lymphatic: Atheromatous arterial calcifications, including left renal artery branch calcifications peripherally. No enlarged lymph nodes. Reproductive: Status post hysterectomy. No adnexal masses. Other: Small umbilical hernia containing fat. Musculoskeletal: Mild lumbar spine degenerative changes. IMPRESSION: 1. No acute abnormality. 2. Stable small bilateral lung nodules, as described above. 3. Stable hepatomegaly. 4. Sigmoid diverticulosis. 5. Atheromatous arterial calcifications, including the coronary arteries and aorta. Electronically Signed   By: Percell Locus.D.  On: 03/11/2018 15:42   Ct Biopsy  Result Date: 03/15/2018 INDICATION: Mild dysplastic syndrome, leukocytosis EXAM: CT GUIDED RIGHT ILIAC BONE MARROW ASPIRATION AND CORE BIOPSY Date:  03/15/2018 03/15/2018 12:19 pm Radiologist:  Jerilynn Mages. Daryll Brod, MD Guidance:  CT FLUOROSCOPY TIME:  Fluoroscopy Time: NONE. MEDICATIONS: 1% lidocaine local ANESTHESIA/SEDATION: 3.0 mg IV Versed; 100 mcg IV Fentanyl Moderate Sedation Time:  12 minutes The patient was continuously monitored during the procedure by the interventional  radiology nurse under my direct supervision. CONTRAST:  None. COMPLICATIONS: None PROCEDURE: Informed consent was obtained from the patient following explanation of the procedure, risks, benefits and alternatives. The patient understands, agrees and consents for the procedure. All questions were addressed. A time out was performed. The patient was positioned prone and non-contrast localization CT was performed of the pelvis to demonstrate the iliac marrow spaces. Maximal barrier sterile technique utilized including caps, mask, sterile gowns, sterile gloves, large sterile drape, hand hygiene, and Betadine prep. Under sterile conditions and local anesthesia, an 11 gauge coaxial bone biopsy needle was advanced into the right iliac marrow space. Needle position was confirmed with CT imaging. Initially, bone marrow aspiration was performed. Next, the 11 gauge outer cannula was utilized to obtain a right iliac bone marrow core biopsy. Needle was removed. Hemostasis was obtained with compression. The patient tolerated the procedure well. Samples were prepared with the cytotechnologist. No immediate complications. IMPRESSION: CT guided right iliac bone marrow aspiration and core biopsy. Electronically Signed   By: Jerilynn Mages.  Shick M.D.   On: 03/15/2018 12:23   Ct Bone Marrow Biopsy & Aspiration  Result Date: 03/15/2018 INDICATION: Mild dysplastic syndrome, leukocytosis EXAM: CT GUIDED RIGHT ILIAC BONE MARROW ASPIRATION AND CORE BIOPSY Date:  03/15/2018 03/15/2018 12:19 pm Radiologist:  Jerilynn Mages. Daryll Brod, MD Guidance:  CT FLUOROSCOPY TIME:  Fluoroscopy Time: NONE. MEDICATIONS: 1% lidocaine local ANESTHESIA/SEDATION: 3.0 mg IV Versed; 100 mcg IV Fentanyl Moderate Sedation Time:  12 minutes The patient was continuously monitored during the procedure by the interventional radiology nurse under my direct supervision. CONTRAST:  None. COMPLICATIONS: None PROCEDURE: Informed consent was obtained from the patient following explanation of  the procedure, risks, benefits and alternatives. The patient understands, agrees and consents for the procedure. All questions were addressed. A time out was performed. The patient was positioned prone and non-contrast localization CT was performed of the pelvis to demonstrate the iliac marrow spaces. Maximal barrier sterile technique utilized including caps, mask, sterile gowns, sterile gloves, large sterile drape, hand hygiene, and Betadine prep. Under sterile conditions and local anesthesia, an 11 gauge coaxial bone biopsy needle was advanced into the right iliac marrow space. Needle position was confirmed with CT imaging. Initially, bone marrow aspiration was performed. Next, the 11 gauge outer cannula was utilized to obtain a right iliac bone marrow core biopsy. Needle was removed. Hemostasis was obtained with compression. The patient tolerated the procedure well. Samples were prepared with the cytotechnologist. No immediate complications. IMPRESSION: CT guided right iliac bone marrow aspiration and core biopsy. Electronically Signed   By: Jerilynn Mages.  Shick M.D.   On: 03/15/2018 12:23   Ct Maxillofacial Wo Contrast  Result Date: 03/11/2018 CLINICAL DATA:  Near syncopal episodes. EXAM: CT HEAD WITHOUT CONTRAST CT MAXILLOFACIAL WITHOUT CONTRAST TECHNIQUE: Multidetector CT imaging of the head and maxillofacial structures were performed using the standard protocol without intravenous contrast. Multiplanar CT image reconstructions of the maxillofacial structures were also generated. COMPARISON:  Brain MRI 09/19/2012 FINDINGS: CT HEAD FINDINGS Brain: No evidence of acute infarction, hemorrhage,  hydrocephalus, extra-axial collection or mass lesion/mass effect. Vascular: No hyperdense vessel or unexpected calcification. Skull: Normal. Negative for fracture or focal lesion. Other: No scalp lesions or hematoma. CT MAXILLOFACIAL FINDINGS Osseous: No acute facial bone fractures or worrisome bone lesions. Orbits: No fracture.  The globes are intact. No orbital mass. The extraocular muscles appear normal. Sinuses: Mild mucoperiosteal thickening involving scattered ethmoid sinuses. There is also a small mucous retention cyst or polyp in the floor of the left maxillary sinus. No findings for acute sinusitis. The mastoid air cells and middle ear cavities are clear. Soft tissues: No soft tissue mass or hematoma. Other: Fairly extensive dental caries are noted. IMPRESSION: 1. No acute intracranial findings or skull fracture. 2. No acute facial bone fractures or bone lesions. 3. No significant sinus disease. 4. Dental caries. Electronically Signed   By: Marijo Sanes M.D.   On: 03/11/2018 15:36    ASSESSMENT & PLAN:   57 y.o. female with  1. Jak2 V617F mutation positive Myeloproliferative syndrome.- BM Bx consistent with Polycythemia Vera  Current hgb.hct are at 14/47.6 Thrombocytosis PLT 725k  2. Increasing leucocytosis - primarily neutrophilia. - related to Polycythemia vera Patient with recent dental infections/abscesses and respiratory infection. Part of Leukocytosis could be due to his MPN  - PCV vs Myelofibrosis.  Neutrophilia began in March 2015 at 9.6k, slowly increased to 14k in August 2018, 19k in March 2019, now to [place] in September 2019.  3. Polycythemia vera Confirmed by 08/15/12 JAK 2 mutation test  3. History of Metastatic melanoma to the right lung  S/p resection by Dr. Servando Snare on 10/21/12, BRAF mutation not detected.  PLAN: -JAK2 mutation identified in 08/15/12 labs -Discussed that an untreated polycythemia vera can certainly cause leukocytosis  -Discussed pt labwork today, 03/24/18; WBC have decreased to 25.4k with ANC at 21.7k, RBC at 7.12, HCT at 47.6%, PLT increased to 725k -Goal of HCT <45%, Goal of PLT <400k -Discussed the 03/15/18 BM Bx which revealed results consistent with JAK2 positive polycythemia vera -No indication of myelofibrosis but will monitor for this  -Discussed the increased risk of  strokes, heart attacks and blood clots that treatment would seek to mitigate -Discussed the small but noted risk of transformation into an acute leukemia over time  -Discussed the recommendation to begin '500mg'$  Hydroxyurea every day, provided supplemental information, and discussed the possible side effects.   -Recommended that the pt continue to abide by sun protection strategies as Hydroxyurea can sensitize the skin  -Advised that pt take Hydroxyurea with food  -Begin '81mg'$  Aspirin, and do not take more than '300mg'$  aspirin in one day as pt takes excedrin  -Continue therapeutic phlebotomies -Will see the pt back in 4 weeks with labs  Absolute smoking cessation  Therapeutic phlebotomy in 1 week RTC with Dr Irene Limbo in 1 month with labs    All of the patients questions were answered with apparent satisfaction. The patient knows to call the clinic with any problems, questions or concerns.  The total time spent in the appt was 40 minutes and more than 50% was on counseling and direct patient cares.     Sullivan Lone MD MS AAHIVMS [facility] Hematology/Oncology Physician [facility]  (Office):       [phone] (Work cell):  [phone] (Fax):           [phone]  03/24/2018 1:03 PM  I, Cline [place], am acting as a scribe for [name]  .I have reviewed the above documentation for accuracy  and completeness, and I agree with the above. [name] MD

## 2018-03-24 ENCOUNTER — Inpatient Hospital Stay: Payer: Medicare HMO | Attending: Hematology | Admitting: Hematology

## 2018-03-24 ENCOUNTER — Inpatient Hospital Stay: Payer: Medicare HMO

## 2018-03-24 ENCOUNTER — Encounter: Payer: Self-pay | Admitting: Hematology

## 2018-03-24 VITALS — BP 120/55 | HR 77 | Temp 98.2°F | Resp 18 | Ht 68.0 in | Wt 226.2 lb

## 2018-03-24 DIAGNOSIS — I10 Essential (primary) hypertension: Secondary | ICD-10-CM | POA: Diagnosis not present

## 2018-03-24 DIAGNOSIS — D473 Essential (hemorrhagic) thrombocythemia: Secondary | ICD-10-CM

## 2018-03-24 DIAGNOSIS — R161 Splenomegaly, not elsewhere classified: Secondary | ICD-10-CM | POA: Diagnosis not present

## 2018-03-24 DIAGNOSIS — Z1589 Genetic susceptibility to other disease: Secondary | ICD-10-CM

## 2018-03-24 DIAGNOSIS — M797 Fibromyalgia: Secondary | ICD-10-CM | POA: Diagnosis not present

## 2018-03-24 DIAGNOSIS — Z85118 Personal history of other malignant neoplasm of bronchus and lung: Secondary | ICD-10-CM | POA: Diagnosis not present

## 2018-03-24 DIAGNOSIS — J449 Chronic obstructive pulmonary disease, unspecified: Secondary | ICD-10-CM | POA: Insufficient documentation

## 2018-03-24 DIAGNOSIS — Z7984 Long term (current) use of oral hypoglycemic drugs: Secondary | ICD-10-CM

## 2018-03-24 DIAGNOSIS — Z79899 Other long term (current) drug therapy: Secondary | ICD-10-CM | POA: Diagnosis not present

## 2018-03-24 DIAGNOSIS — E1140 Type 2 diabetes mellitus with diabetic neuropathy, unspecified: Secondary | ICD-10-CM | POA: Diagnosis not present

## 2018-03-24 DIAGNOSIS — R531 Weakness: Secondary | ICD-10-CM | POA: Diagnosis not present

## 2018-03-24 DIAGNOSIS — D45 Polycythemia vera: Secondary | ICD-10-CM | POA: Insufficient documentation

## 2018-03-24 DIAGNOSIS — Z85820 Personal history of malignant melanoma of skin: Secondary | ICD-10-CM | POA: Diagnosis not present

## 2018-03-24 DIAGNOSIS — F17210 Nicotine dependence, cigarettes, uncomplicated: Secondary | ICD-10-CM | POA: Diagnosis not present

## 2018-03-24 DIAGNOSIS — D75839 Thrombocytosis, unspecified: Secondary | ICD-10-CM

## 2018-03-24 DIAGNOSIS — E785 Hyperlipidemia, unspecified: Secondary | ICD-10-CM | POA: Diagnosis not present

## 2018-03-24 DIAGNOSIS — D72829 Elevated white blood cell count, unspecified: Secondary | ICD-10-CM | POA: Diagnosis not present

## 2018-03-24 DIAGNOSIS — Z85828 Personal history of other malignant neoplasm of skin: Secondary | ICD-10-CM | POA: Insufficient documentation

## 2018-03-24 DIAGNOSIS — G4730 Sleep apnea, unspecified: Secondary | ICD-10-CM | POA: Insufficient documentation

## 2018-03-24 DIAGNOSIS — G47419 Narcolepsy without cataplexy: Secondary | ICD-10-CM | POA: Insufficient documentation

## 2018-03-24 LAB — CBC WITH DIFFERENTIAL/PLATELET
Basophils Absolute: 0.1 10*3/uL (ref 0.0–0.1)
Basophils Relative: 1 %
Eosinophils Absolute: 0.8 10*3/uL — ABNORMAL HIGH (ref 0.0–0.5)
Eosinophils Relative: 3 %
HEMATOCRIT: 47.6 % — AB (ref 34.8–46.6)
HEMOGLOBIN: 14 g/dL (ref 11.6–15.9)
LYMPHS ABS: 1.9 10*3/uL (ref 0.9–3.3)
LYMPHS PCT: 8 %
MCH: 19.7 pg — AB (ref 25.1–34.0)
MCHC: 29.4 g/dL — ABNORMAL LOW (ref 31.5–36.0)
MCV: 66.9 fL — AB (ref 79.5–101.0)
MONOS PCT: 3 %
Monocytes Absolute: 0.9 10*3/uL (ref 0.1–0.9)
NEUTROS PCT: 85 %
NRBC: 1 /100{WBCs} — AB
Neutro Abs: 21.7 10*3/uL — ABNORMAL HIGH (ref 1.5–6.5)
Platelets: 725 10*3/uL — ABNORMAL HIGH (ref 145–400)
RBC: 7.12 MIL/uL — AB (ref 3.70–5.45)
RDW: 22.9 % — ABNORMAL HIGH (ref 11.2–14.5)
WBC: 25.4 10*3/uL — AB (ref 3.9–10.3)

## 2018-03-24 MED ORDER — HYDROXYUREA 500 MG PO CAPS: 500.0000 mg | ORAL_CAPSULE | Freq: Every day | ORAL | 1 refills | Status: DC

## 2018-03-25 ENCOUNTER — Telehealth: Payer: Self-pay

## 2018-03-25 ENCOUNTER — Other Ambulatory Visit: Payer: Self-pay | Admitting: Family Medicine

## 2018-03-25 NOTE — Telephone Encounter (Signed)
Spoke with the patient concerning her upcoming appointments per 9/27 los. Also mailed a letter with a calender enclosed

## 2018-03-29 ENCOUNTER — Other Ambulatory Visit: Payer: Self-pay | Admitting: Family Medicine

## 2018-03-29 DIAGNOSIS — C3490 Malignant neoplasm of unspecified part of unspecified bronchus or lung: Secondary | ICD-10-CM | POA: Diagnosis not present

## 2018-03-29 DIAGNOSIS — J9611 Chronic respiratory failure with hypoxia: Secondary | ICD-10-CM | POA: Diagnosis not present

## 2018-03-31 ENCOUNTER — Inpatient Hospital Stay: Payer: Medicare HMO | Attending: Hematology

## 2018-03-31 VITALS — BP 152/66 | HR 76 | Temp 98.3°F | Resp 20

## 2018-03-31 DIAGNOSIS — Z85118 Personal history of other malignant neoplasm of bronchus and lung: Secondary | ICD-10-CM | POA: Diagnosis not present

## 2018-03-31 DIAGNOSIS — Z79899 Other long term (current) drug therapy: Secondary | ICD-10-CM | POA: Diagnosis not present

## 2018-03-31 DIAGNOSIS — D751 Secondary polycythemia: Secondary | ICD-10-CM | POA: Diagnosis not present

## 2018-03-31 DIAGNOSIS — E785 Hyperlipidemia, unspecified: Secondary | ICD-10-CM | POA: Diagnosis not present

## 2018-03-31 DIAGNOSIS — F17210 Nicotine dependence, cigarettes, uncomplicated: Secondary | ICD-10-CM | POA: Insufficient documentation

## 2018-03-31 DIAGNOSIS — Z9981 Dependence on supplemental oxygen: Secondary | ICD-10-CM | POA: Diagnosis not present

## 2018-03-31 DIAGNOSIS — D473 Essential (hemorrhagic) thrombocythemia: Secondary | ICD-10-CM | POA: Diagnosis not present

## 2018-03-31 DIAGNOSIS — E875 Hyperkalemia: Secondary | ICD-10-CM | POA: Diagnosis not present

## 2018-03-31 DIAGNOSIS — Z85828 Personal history of other malignant neoplasm of skin: Secondary | ICD-10-CM | POA: Diagnosis not present

## 2018-03-31 DIAGNOSIS — I10 Essential (primary) hypertension: Secondary | ICD-10-CM | POA: Diagnosis not present

## 2018-03-31 DIAGNOSIS — Z7982 Long term (current) use of aspirin: Secondary | ICD-10-CM | POA: Diagnosis not present

## 2018-03-31 DIAGNOSIS — M797 Fibromyalgia: Secondary | ICD-10-CM | POA: Diagnosis not present

## 2018-03-31 DIAGNOSIS — G47419 Narcolepsy without cataplexy: Secondary | ICD-10-CM | POA: Diagnosis not present

## 2018-03-31 DIAGNOSIS — J9611 Chronic respiratory failure with hypoxia: Secondary | ICD-10-CM | POA: Insufficient documentation

## 2018-03-31 DIAGNOSIS — Z85820 Personal history of malignant melanoma of skin: Secondary | ICD-10-CM | POA: Insufficient documentation

## 2018-03-31 DIAGNOSIS — D45 Polycythemia vera: Secondary | ICD-10-CM | POA: Insufficient documentation

## 2018-03-31 DIAGNOSIS — Z7984 Long term (current) use of oral hypoglycemic drugs: Secondary | ICD-10-CM | POA: Diagnosis not present

## 2018-03-31 DIAGNOSIS — E1140 Type 2 diabetes mellitus with diabetic neuropathy, unspecified: Secondary | ICD-10-CM | POA: Diagnosis not present

## 2018-03-31 DIAGNOSIS — G4730 Sleep apnea, unspecified: Secondary | ICD-10-CM | POA: Insufficient documentation

## 2018-03-31 DIAGNOSIS — D471 Chronic myeloproliferative disease: Secondary | ICD-10-CM | POA: Diagnosis not present

## 2018-03-31 DIAGNOSIS — D72829 Elevated white blood cell count, unspecified: Secondary | ICD-10-CM | POA: Insufficient documentation

## 2018-03-31 DIAGNOSIS — J449 Chronic obstructive pulmonary disease, unspecified: Secondary | ICD-10-CM | POA: Diagnosis not present

## 2018-03-31 NOTE — Patient Instructions (Signed)

## 2018-04-05 ENCOUNTER — Other Ambulatory Visit: Payer: Self-pay | Admitting: *Deleted

## 2018-04-05 MED ORDER — TRIAMTERENE-HCTZ 37.5-25 MG PO CAPS: 1.0000 | ORAL_CAPSULE | Freq: Every morning | ORAL | 0 refills | Status: DC

## 2018-04-05 MED ORDER — METFORMIN HCL 1000 MG PO TABS: ORAL_TABLET | ORAL | 0 refills | Status: DC

## 2018-04-06 ENCOUNTER — Telehealth: Payer: Self-pay | Admitting: *Deleted

## 2018-04-06 NOTE — Telephone Encounter (Signed)
Received call from patient.   Reports that she requires Tier Exception for Jardiance.   Submitted via Cover My Meds.

## 2018-04-06 NOTE — Telephone Encounter (Signed)
The plan will fax you a determination, typically within 1 to 5 business days.

## 2018-04-07 MED ORDER — EMPAGLIFLOZIN 25 MG PO TABS: 25.0000 mg | ORAL_TABLET | Freq: Every day | ORAL | 4 refills | Status: DC

## 2018-04-07 NOTE — Telephone Encounter (Signed)
Received determination for Tier Exception.   There are no lower dose brand name drugs to treat patient condition so tier exception denied.   Patient made aware.    Prescription assistance forms completed for patient. Samples placed at front desk for pick up.

## 2018-04-11 ENCOUNTER — Telehealth: Payer: Self-pay | Admitting: *Deleted

## 2018-04-11 NOTE — Telephone Encounter (Signed)
I do see that Katelyn Cline documented 03/10/2018 abscess of mouth. Also see that she has allergy to penicillin--- so avoid amoxicillin. Yes the clindamycin is approved.  Would do 3 times daily x7 days for #21+0.

## 2018-04-11 NOTE — Telephone Encounter (Signed)
Received call from patient.   Reports that she has another dental abscess from poor dentition.   Reports that she has not been able to afford to have teeth removed.   Requested refill prescription for ABTx~ Clindamycin 300mg  TID.    Please advise.

## 2018-04-12 MED ORDER — FLUCONAZOLE 150 MG PO TABS: 150.0000 mg | ORAL_TABLET | Freq: Once | ORAL | 0 refills | Status: AC

## 2018-04-12 MED ORDER — CLINDAMYCIN HCL 300 MG PO CAPS: 300.0000 mg | ORAL_CAPSULE | Freq: Three times a day (TID) | ORAL | 0 refills | Status: DC

## 2018-04-12 MED ORDER — VALACYCLOVIR HCL 1 G PO TABS: 1000.0000 mg | ORAL_TABLET | Freq: Two times a day (BID) | ORAL | 0 refills | Status: DC

## 2018-04-12 NOTE — Telephone Encounter (Signed)
Prescription sent to pharmacy.

## 2018-04-12 NOTE — Addendum Note (Signed)
Addended by: Sheral Flow on: 04/12/2018 02:40 PM   Modules accepted: Orders

## 2018-04-13 ENCOUNTER — Telehealth: Payer: Self-pay | Admitting: *Deleted

## 2018-04-13 NOTE — Telephone Encounter (Signed)
Received call from patient.   Inquired as to status of trying to get Medical insurance to cover extractions.   Call placed to Dr. Sueanne Margarita office in [place]. [phone]~ telephone.   Was informed that patient will need to be seen to establish a plan of care. After plan is established, patient will then need to contact medical insurance to determine if extraction can be covered.   Call placed to patient and patient made aware.

## 2018-04-21 ENCOUNTER — Telehealth: Payer: Self-pay | Admitting: Hematology

## 2018-04-21 ENCOUNTER — Other Ambulatory Visit: Payer: Self-pay | Admitting: Family Medicine

## 2018-04-21 ENCOUNTER — Encounter: Payer: Self-pay | Admitting: Hematology

## 2018-04-21 ENCOUNTER — Inpatient Hospital Stay (HOSPITAL_BASED_OUTPATIENT_CLINIC_OR_DEPARTMENT_OTHER): Payer: Medicare HMO | Admitting: Hematology

## 2018-04-21 ENCOUNTER — Inpatient Hospital Stay: Payer: Medicare HMO

## 2018-04-21 ENCOUNTER — Ambulatory Visit: Payer: Self-pay | Admitting: Hematology

## 2018-04-21 ENCOUNTER — Other Ambulatory Visit: Payer: Self-pay

## 2018-04-21 VITALS — BP 117/52 | HR 72 | Temp 98.3°F | Resp 18 | Ht 68.0 in | Wt 228.7 lb

## 2018-04-21 DIAGNOSIS — Z85828 Personal history of other malignant neoplasm of skin: Secondary | ICD-10-CM

## 2018-04-21 DIAGNOSIS — J449 Chronic obstructive pulmonary disease, unspecified: Secondary | ICD-10-CM

## 2018-04-21 DIAGNOSIS — D473 Essential (hemorrhagic) thrombocythemia: Secondary | ICD-10-CM

## 2018-04-21 DIAGNOSIS — E1140 Type 2 diabetes mellitus with diabetic neuropathy, unspecified: Secondary | ICD-10-CM | POA: Diagnosis not present

## 2018-04-21 DIAGNOSIS — G4730 Sleep apnea, unspecified: Secondary | ICD-10-CM | POA: Diagnosis not present

## 2018-04-21 DIAGNOSIS — D72829 Elevated white blood cell count, unspecified: Secondary | ICD-10-CM

## 2018-04-21 DIAGNOSIS — Z7984 Long term (current) use of oral hypoglycemic drugs: Secondary | ICD-10-CM

## 2018-04-21 DIAGNOSIS — I10 Essential (primary) hypertension: Secondary | ICD-10-CM

## 2018-04-21 DIAGNOSIS — G47419 Narcolepsy without cataplexy: Secondary | ICD-10-CM

## 2018-04-21 DIAGNOSIS — D471 Chronic myeloproliferative disease: Secondary | ICD-10-CM

## 2018-04-21 DIAGNOSIS — Z85820 Personal history of malignant melanoma of skin: Secondary | ICD-10-CM

## 2018-04-21 DIAGNOSIS — D751 Secondary polycythemia: Secondary | ICD-10-CM

## 2018-04-21 DIAGNOSIS — M797 Fibromyalgia: Secondary | ICD-10-CM

## 2018-04-21 DIAGNOSIS — F17210 Nicotine dependence, cigarettes, uncomplicated: Secondary | ICD-10-CM

## 2018-04-21 DIAGNOSIS — J9611 Chronic respiratory failure with hypoxia: Secondary | ICD-10-CM

## 2018-04-21 DIAGNOSIS — D45 Polycythemia vera: Secondary | ICD-10-CM

## 2018-04-21 DIAGNOSIS — E785 Hyperlipidemia, unspecified: Secondary | ICD-10-CM

## 2018-04-21 DIAGNOSIS — Z9981 Dependence on supplemental oxygen: Secondary | ICD-10-CM

## 2018-04-21 DIAGNOSIS — G894 Chronic pain syndrome: Secondary | ICD-10-CM

## 2018-04-21 DIAGNOSIS — Z1589 Genetic susceptibility to other disease: Secondary | ICD-10-CM

## 2018-04-21 DIAGNOSIS — D75839 Thrombocytosis, unspecified: Secondary | ICD-10-CM

## 2018-04-21 DIAGNOSIS — Z79899 Other long term (current) drug therapy: Secondary | ICD-10-CM

## 2018-04-21 DIAGNOSIS — Z7982 Long term (current) use of aspirin: Secondary | ICD-10-CM

## 2018-04-21 DIAGNOSIS — E875 Hyperkalemia: Secondary | ICD-10-CM | POA: Diagnosis not present

## 2018-04-21 DIAGNOSIS — Z85118 Personal history of other malignant neoplasm of bronchus and lung: Secondary | ICD-10-CM

## 2018-04-21 LAB — CBC WITH DIFFERENTIAL/PLATELET
ABS IMMATURE GRANULOCYTES: 0.16 10*3/uL — AB (ref 0.00–0.07)
BASOS ABS: 0.2 10*3/uL — AB (ref 0.0–0.1)
BASOS PCT: 1 %
EOS PCT: 4 %
Eosinophils Absolute: 0.6 10*3/uL — ABNORMAL HIGH (ref 0.0–0.5)
HCT: 51.8 % — ABNORMAL HIGH (ref 36.0–46.0)
Hemoglobin: 14.3 g/dL (ref 12.0–15.0)
Immature Granulocytes: 1 %
LYMPHS PCT: 13 %
Lymphs Abs: 2.5 10*3/uL (ref 0.7–4.0)
MCH: 19.5 pg — AB (ref 26.0–34.0)
MCHC: 27.6 g/dL — ABNORMAL LOW (ref 30.0–36.0)
MCV: 70.7 fL — AB (ref 80.0–100.0)
Monocytes Absolute: 0.6 10*3/uL (ref 0.1–1.0)
Monocytes Relative: 3 %
NEUTROS ABS: 14.2 10*3/uL — AB (ref 1.7–7.7)
NRBC: 0.4 % — AB (ref 0.0–0.2)
Neutrophils Relative %: 78 %
PLATELETS: 307 10*3/uL (ref 150–400)
RBC: 7.33 MIL/uL — AB (ref 3.87–5.11)
RDW: 26 % — AB (ref 11.5–15.5)
WBC: 18.3 10*3/uL — AB (ref 4.0–10.5)

## 2018-04-21 LAB — CMP (CANCER CENTER ONLY)
ALT: 15 U/L (ref 0–44)
AST: 13 U/L — ABNORMAL LOW (ref 15–41)
Albumin: 3.6 g/dL (ref 3.5–5.0)
Alkaline Phosphatase: 155 U/L — ABNORMAL HIGH (ref 38–126)
Anion gap: 12 (ref 5–15)
BUN: 37 mg/dL — ABNORMAL HIGH (ref 6–20)
CHLORIDE: 105 mmol/L (ref 98–111)
CO2: 25 mmol/L (ref 22–32)
Calcium: 9.6 mg/dL (ref 8.9–10.3)
Creatinine: 1.08 mg/dL — ABNORMAL HIGH (ref 0.44–1.00)
GFR, EST NON AFRICAN AMERICAN: 55 mL/min — AB (ref >60)
Glucose, Bld: 140 mg/dL — ABNORMAL HIGH (ref 70–99)
POTASSIUM: 5.6 mmol/L — AB (ref 3.5–5.1)
SODIUM: 142 mmol/L (ref 135–145)
Total Bilirubin: 0.3 mg/dL (ref 0.3–1.2)
Total Protein: 7.3 g/dL (ref 6.5–8.1)

## 2018-04-21 LAB — LACTATE DEHYDROGENASE: LDH: 267 U/L — ABNORMAL HIGH (ref 98–192)

## 2018-04-21 NOTE — Telephone Encounter (Signed)
Received call from patient.   Reports that she was seen at Oncology today and was noted to have elevated BUN and K+.  States that oncology recommended stopping lisinopril and triamterene/ HCTZ combo as lisinopril and triamterene can elevate K+. Recommended continuing HCTZ only and another HTN medication if needed.   Of note, patient has appointment scheduled on 04/28/2018 to re-check K+.  Please advise.

## 2018-04-21 NOTE — Telephone Encounter (Signed)
Ok to refill??  Last office visit/ refill 03/23/2018.

## 2018-04-21 NOTE — Progress Notes (Signed)
HEMATOLOGY/ONCOLOGY CONSULTATION NOTE  Date of Service: 04/21/2018  Patient Care Team: [facility], Modena Nunnery, MD as PCP - General (Family Medicine) Fanny Bien, MD as Attending Physician (Family Medicine) Everardo All, MD (Hematology and Oncology) [name], MD as Attending Physician (Cardiothoracic Surgery)  CHIEF COMPLAINTS/PURPOSE OF CONSULTATION:  Increasing Leukocytosis in the setting of h/o Jak2 mutation positive MPN  HISTORY OF PRESENTING ILLNESS:   Katelyn Cline is a wonderful 57 y.o. female who has been referred to [place] by Dr. Tana Coast  for evaluation and management of Leukocytosis. The pt reports that she is doing well overall.   The pt presented to the ED on 03/11/18 with significant weakness, hyperkalemia, and increasing leukocytosis with her PCP. She also notes that she had abscesses and facial infections about 4 weeks ago and received Clindamycin. She then developed a vaginal yeast infection, treated with Monistat and worsened to lesions and sores. The pt notes that since beginning antibiotics her cough has improved.   Patient report having a h/o melanoma metastatic to lung s/pwedge resection in 2014 . No recurrence since then. She notes she was been monitored with CT scans since then.  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  Patient notes that she previous needed therapeutic phlebotomy along time back but  denies ever taking medication for her polycythemia vera.   The pt notes that she used to sleep with a CPAP for many years. She then began treatment for her narcolepsy. The pt notes that she is on O2 and has remained on O2 since her surgery in 2014 to resect the melanoma. She has degenerative disc disease, fibromyalgia, and neuropathy in her feet.   The pt notes that she feels  less "groggy-headed" today.   Most recent lab results (03/14/18) of CBC and BMP is as follows: all values are WNL except for WBC at 31.0k, RBC at 6.97, HCT at 47.1, MCV at 67.6, MCH at 19.7, MCHC at 29.1, RDW at 22.3, PLT at 542k, Glucose at 138.  On review of systems, pt reports recent infections, feeling tired, moving her bowels well, resolved cough and denies abdominal pains, problems passing urine, and any other symptoms.   On PMHx the pt reports basal cell carcinoma in 2013, metastatic melanoma to lung wedge resected with thoracoscopy on 10/21/12.  Interval History:   Katelyn Cline returns today for management and evaluation of her polycythemia vera. The patient's last visit with [place] was on 03/24/18. The pt reports that she is doing well overall.   The pt reports that she has recently had an abscess in the left side of her jaw. She has had repeated abscesses in the right side of her jaw. She will have dental extractions in the first week of November, she has had some financial challenges.   The pt notes that she was diagnosed with sleep apnea and narcolepsy. She notes that the CPAP did not help her, and she does not use this. She was taking Ritalin and then began '200mg'$  Provigil. She notes that she stopped medical treatment of her narcolepsy when she quit working and notes that she sleeps well.   The pt notes that she is continuing to smoke and is smoking a half pack of cigarettes each day.   Lab results today (04/21/18) of CBC w/diff, CMP is as  follows: all values are WNL except for WBC at 18.3k, RBC at 7.33, HCT at 51.8, MCV at 70.7, MCH at 19.5, MCHC at 27.6, RDW at 26.0, nRBC at 0.4%, ANC at 14.2k, Eosinophils abs at 600, Basophils abs at 200, Abs immature granulocytes at 0.16k, Potassium at 5.6, Glucose at 140, BUN at 37, Creatinine at 1.08, AST at 13, Alk Phos at 155, GFR at 55. 04/21/18 LDH at 267  On review of systems, pt reports stable energy levels, left jaw pain resolving, and  denies any other symptoms.   MEDICAL HISTORY:  Past Medical History:  Diagnosis Date  . Agoraphobia   . Basal cell carcinoma   . Chronic pain   . Chronic respiratory failure with hypoxia ([name])    3L [place]  . DM type 2 (diabetes mellitus, type 2) ([place]) 10/16/2013  . Dysrhythmia    Hx: of palpitations "a long time ago"  . Elevated hemoglobin ([place]) 2014  . Fibromyalgia   . History of UTI   . HTN (hypertension)   . Hyperlipidemia   . Major depression   . Metastatic melanoma ([place]) 10/18/2012   12 mm posterior right upper lobe pulmonary nodule, max SUV 3.0    . Migraines   . Miscarriage    x 4  . Narcolepsy   . Panic attacks   . Peripheral neuropathy   . Peripheral neuropathy    in feet  . Polycythemia secondary to smoking   . Polycythemia vera(238.4)   . Scoliosis   . Shortness of breath    Hx: of with activity  . Sleep apnea   . Vertigo     SURGICAL HISTORY: Past Surgical History:  Procedure Laterality Date  . BASAL CELL CARCINOMA EXCISION     flap surgery on face  . BREAST LUMPECTOMY     bilateral  . COLONOSCOPY W/ BIOPSIES AND POLYPECTOMY     Hx: of  . CYSTECTOMY     abdominal wall   . DILATION AND CURETTAGE OF UTERUS     Hx: of   . VAGINAL HYSTERECTOMY    . VIDEO ASSISTED THORACOSCOPY (VATS)/WEDGE RESECTION Right 10/21/2012   Procedure: VIDEO ASSISTED THORACOSCOPY (VATS)/WEDGE RESECTION;  Surgeon: [name], MD;  Location: [place];  Service: Thoracic;  Laterality: Right;  Marland Kitchen VIDEO BRONCHOSCOPY N/A 10/21/2012   Procedure: VIDEO BRONCHOSCOPY;  Surgeon: [name], MD;  Location: [facility] OR;  Service: Thoracic;  Laterality: N/A;    SOCIAL HISTORY: Social History   Socioeconomic History  . Marital status: Divorced    Spouse name: Not on file  . Number of children: Not on file  . Years of education: 12+  . Highest education level: Not on file  Occupational History  . Not on file  Social Needs  . Financial resource strain: Not on file  . Food  insecurity:    Worry: Not on file    Inability: Not on file  . Transportation needs:    Medical: Not on file    Non-medical: Not on file  Tobacco Use  . Smoking status: Current Every Day Smoker    Packs/day: 1.00    Years: 10.00    Pack years: 10.00    Types: Cigarettes    Last attempt to quit: 09/16/2012    Years since quitting: 5.5  . Smokeless tobacco: Never Used  Substance and Sexual Activity  . Alcohol use: Yes    Comment: 1 -2 drinks a month, occasional  . Drug use: No  . Sexual  activity: Not Currently  Lifestyle  . Physical activity:    Days per week: Not on file    Minutes per session: Not on file  . Stress: Not on file  Relationships  . Social connections:    Talks on phone: Not on file    Gets together: Not on file    Attends religious service: Not on file    Active member of club or organization: Not on file    Attends meetings of clubs or organizations: Not on file    Relationship status: Not on file  . Intimate partner violence:    Fear of current or ex partner: Not on file    Emotionally abused: Not on file    Physically abused: Not on file    Forced sexual activity: Not on file  Other Topics Concern  . Not on file  Social History Narrative  . Not on file    FAMILY HISTORY: Family History  Problem Relation Age of Onset  . Arthritis Mother   . Hyperlipidemia Mother   . Depression Mother   . Anxiety disorder Mother   . Dementia Mother   . Hypertension Father   . Hyperlipidemia Father   . Heart disease Father   . Stroke Father   . Dementia Father   . Heart disease Brother   . ADD / ADHD Son   . Alcohol abuse Maternal Grandfather   . Bipolar disorder Neg Hx   . Drug abuse Neg Hx   . OCD Neg Hx   . Paranoid behavior Neg Hx   . Schizophrenia Neg Hx   . Seizures Neg Hx   . Sexual abuse Neg Hx   . Physical abuse Neg Hx     ALLERGIES:  is allergic to contrast media [iodinated diagnostic agents]; erythromycin; flagyl [metronidazole]; latex;  other; penicillins; and tetracyclines & related.  MEDICATIONS:  Current Outpatient Medications  Medication Sig Dispense Refill  . aspirin EC 81 MG tablet Take 81 mg by mouth daily.    Marland Kitchen amLODipine (NORVASC) 5 MG tablet TAKE 1 TABLET BY MOUTH EVERY DAY 90 tablet 0  . atorvastatin (LIPITOR) 80 MG tablet Take 1 tablet (80 mg total) by mouth daily at 6 PM. 90 tablet 3  . clindamycin (CLEOCIN) 300 MG capsule Take 1 capsule (300 mg total) by mouth 3 (three) times daily. 21 capsule 0  . DULoxetine (CYMBALTA) 60 MG capsule TAKE 2 CAPSULES BY MOUTH EVERY DAY 60 capsule 2  . empagliflozin (JARDIANCE) 25 MG TABS tablet Take 25 mg by mouth daily. 90 tablet 4  . fenofibrate (TRICOR) 48 MG tablet TAKE 1 TABLET(48 MG) BY MOUTH DAILY 90 tablet 0  . fish oil-omega-3 fatty acids 1000 MG capsule Take 2 g by mouth 2 (two) times daily.    Marland Kitchen gabapentin (NEURONTIN) 100 MG capsule TAKE 1 TO 3 CAPSULES(100 TO 300 MG) BY MOUTH THREE TIMES DAILY AS NEEDED FOR NERVE PAIN 90 capsule 2  . Glucose Blood (BLOOD GLUCOSE TEST STRIPS) STRP Please dispense based on insurance and patient preference. Use as directed to monitor FSBS 1x daily. Dx: E11.9. 100 each 3  . hydroxyurea (HYDREA) 500 MG capsule Take 1 capsule (500 mg total) by mouth daily. May take with food to minimize GI side effects. 30 capsule 1  . lisinopril (PRINIVIL,ZESTRIL) 5 MG tablet TAKE 1 TABLET BY MOUTH DAILY 90 tablet 1  . metFORMIN (GLUCOPHAGE) 1000 MG tablet TAKE 1 TABLET BY MOUTH TWICE DAILY WITH A MEAL 180 tablet 0  .  MICROLET LANCETS MISC Please dispense based on patient and insurance preference. Use as directed to monitor  FSBS 1x daily. Dx: E11.9. 100 each 11  . oxyCODONE-acetaminophen (PERCOCET) 7.5-325 MG tablet Take 1 tablet by mouth 3 (three) times daily as needed for severe pain. 90 tablet 0  . Pseudoeph-Doxylamine-DM-APAP (NYQUIL PO) Take 1-2 capsules by mouth at bedtime as needed (for cold/flu).    Marland Kitchen tiZANidine (ZANAFLEX) 4 MG tablet Take 1 tablet  (4 mg total) by mouth 2 (two) times daily as needed for muscle spasms. 60 tablet 2  . triamterene-hydrochlorothiazide (DYAZIDE) 37.5-25 MG capsule Take 1 each (1 capsule total) by mouth every morning. 90 capsule 0  . valACYclovir (VALTREX) 1000 MG tablet Take 1 tablet (1,000 mg total) by mouth 2 (two) times daily. 20 tablet 0   No current facility-administered medications for this visit.     REVIEW OF SYSTEMS:    A 10+ POINT REVIEW OF SYSTEMS WAS OBTAINED including neurology, dermatology, psychiatry, cardiac, respiratory, lymph, extremities, GI, GU, Musculoskeletal, constitutional, breasts, reproductive, HEENT.  All pertinent positives are noted in the HPI.  All others are negative.   PHYSICAL EXAMINATION: ECOG PERFORMANCE STATUS: 2 - Symptomatic, <50% confined to bed  . Vitals:   04/21/18 1020  BP: (!) 117/52  Pulse: 72  Resp: 18  Temp: 98.3 F (36.8 C)  SpO2: 95%   Filed Weights   04/21/18 1020  Weight: 228 lb 11.2 oz (103.7 kg)   .Body mass index is 34.77 kg/m.  GENERAL:alert, in no acute distress and comfortable SKIN: no acute rashes, no significant lesions EYES: conjunctiva are pink and non-injected, sclera anicteric OROPHARYNX: MMM, no exudates, no oropharyngeal erythema or ulceration NECK: supple, no JVD LYMPH:  no palpable lymphadenopathy in the cervical, axillary or inguinal regions LUNGS: clear to auscultation b/l with normal respiratory effort HEART: regular rate & rhythm ABDOMEN:  normoactive bowel sounds , non tender, not distended. Splenomegaly 3 fingerbreadths under costal margin Extremity: no pedal edema PSYCH: alert & oriented x 3 with fluent speech NEURO: no focal motor/sensory deficits   LABORATORY DATA:  I have reviewed the data as listed  . CBC Latest Ref Rng & Units 04/21/2018 03/24/2018 03/16/2018  WBC 4.0 - 10.5 K/uL 18.3(H) 25.4(H) 23.1(H)  Hemoglobin 12.0 - 15.0 g/dL 14.3 14.0 12.0  Hematocrit 36.0 - 46.0 % 51.8(H) 47.6(H) 41.7  Platelets  150 - 400 K/uL 307 725(H) 483(H)   . CBC    Component Value Date/Time   WBC 18.3 (H) 04/21/2018 0858   RBC 7.33 (H) 04/21/2018 0858   HGB 14.3 04/21/2018 0858   HCT 51.8 (H) 04/21/2018 0858   PLT 307 04/21/2018 0858   MCV 70.7 (L) 04/21/2018 0858   MCH 19.5 (L) 04/21/2018 0858   MCHC 27.6 (L) 04/21/2018 0858   RDW 26.0 (H) 04/21/2018 0858   LYMPHSABS 2.5 04/21/2018 0858   MONOABS 0.6 04/21/2018 0858   EOSABS 0.6 (H) 04/21/2018 0858   BASOSABS 0.2 (H) 04/21/2018 0858     . CMP Latest Ref Rng & Units 04/21/2018 03/16/2018 03/15/2018  Glucose 70 - 99 mg/dL 140(H) 156(H) 142(H)  BUN 6 - 20 mg/dL 37(H) 15 13  Creatinine 0.44 - 1.00 mg/dL 1.08(H) 0.46 0.48  Sodium 135 - 145 mmol/L 142 142 143  Potassium 3.5 - 5.1 mmol/L 5.6(H) 3.8 4.1  Chloride 98 - 111 mmol/L 105 108 108  CO2 22 - 32 mmol/L '25 25 27  '$ Calcium 8.9 - 10.3 mg/dL 9.6 9.0 9.1  Total Protein 6.5 -  8.1 g/dL 7.3 - -  Total Bilirubin 0.3 - 1.2 mg/dL 0.3 - -  Alkaline Phos 38 - 126 U/L 155(H) - -  AST 15 - 41 U/L 13(L) - -  ALT 0 - 44 U/L 15 - -   08/15/12 JAK2 Mutation:     03/15/18 BM Bx:      RADIOGRAPHIC STUDIES: I have personally reviewed the radiological images as listed and agreed with the findings in the report. No results found.  ASSESSMENT & PLAN:   56 y.o. female with  1. 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 Increasing leucocytosis - primarily neutrophilia. - related to Polycythemia vera Patient with recent dental infections/abscesses and respiratory infection. Part of Leukocytosis could be due to his MPN  - PCV vs Myelofibrosis.  Neutrophilia began in March 2015 at 9.6k, slowly increased to 14k in August 2018, 19k in March 2019, now to [place] in September 2019.  3. Polycythemia vera Confirmed  by 08/15/12 JAK 2 mutation test  4. History of Metastatic melanoma to the right lung  S/p resection by Dr. Servando Snare on 10/21/12, BRAF mutation not detected.  PLAN: -JAK2 mutation identified in 08/15/12 labs -Discussed that an untreated polycythemia vera can certainly cause leukocytosis  -Goal of HCT <45%, Goal of PLT <400k -No indication of myelofibrosis but will monitor for this  -Recommended that the pt continue to abide by sun protection strategies as Hydroxyurea can sensitize the skin  -Advised that pt take Hydroxyurea with food  -Begin '81mg'$  Aspirin, and do not take more than '300mg'$  aspirin in one day as pt takes excedrin  -Discussed pt labwork today, 04/21/18; Potassium higher at 5.6, WBC and ANC improved to 18.3k and 14.2k respectively. RBC increased to 7.33, HCT increased to 51.8%. MCV increased to 70.7. PLT normalized to 307k. Creatinine increased to 1.08. -Concerned for volume contraction with dehydration, which would falsely increase HCT. HGB normal at 14.3.  -Recommended that the pt drink more water, at least 48-64 oz of water each day  -Recommended that the pt discuss her hyperkalemia further with her PCP, suspect this is due to dehydration, Lisinopril and water pils -Will order IV fluids and therapeutic phlebotomy -Will continue monthly therapeutic phlebotomies until otherwise indicated -Counseled the pt towards complete smoking cessation and explained that this would serve as a cause for increased polycythemia and strokes -Recommend Peridex mouthwash -Recommend checking potassium level again with PCP in one week  -Continue '500mg'$  Hydroxyurea every day    -Therapeutic phlebotomy + IVF tomorrow and in 4 weeks -RTC with dr Irene Limbo in 4 weeks with labs and therapeutic phlebotomy   All of the patients questions were answered with apparent satisfaction. The patient knows to call the clinic with any problems, questions or concerns.  The total time spent in the appt was 30 minutes and  more than 50% was on counseling and direct patient cares.    Sullivan Lone MD MS AAHIVMS [facility] Hematology/Oncology Physician [facility]  (Office):       [phone] (Work cell):  [phone] (Fax):           [phone]  04/21/2018 11:05 AM  I, Baldwin Jamaica, am acting as a scribe for [name]  .I have reviewed the above documentation for accuracy and completeness, and I agree with the above. [name] MD

## 2018-04-21 NOTE — Telephone Encounter (Signed)
Scheduled appt per 10/24 los - gave patient AVS and calender per los . 

## 2018-04-21 NOTE — Telephone Encounter (Signed)
Pt needs refill on oxycodone to [facility].

## 2018-04-22 ENCOUNTER — Inpatient Hospital Stay: Payer: Medicare HMO

## 2018-04-22 VITALS — BP 124/55 | HR 69 | Temp 98.6°F | Resp 18

## 2018-04-22 DIAGNOSIS — D471 Chronic myeloproliferative disease: Secondary | ICD-10-CM | POA: Diagnosis not present

## 2018-04-22 DIAGNOSIS — J449 Chronic obstructive pulmonary disease, unspecified: Secondary | ICD-10-CM | POA: Diagnosis not present

## 2018-04-22 DIAGNOSIS — G4730 Sleep apnea, unspecified: Secondary | ICD-10-CM | POA: Diagnosis not present

## 2018-04-22 DIAGNOSIS — G47419 Narcolepsy without cataplexy: Secondary | ICD-10-CM | POA: Diagnosis not present

## 2018-04-22 DIAGNOSIS — D45 Polycythemia vera: Secondary | ICD-10-CM

## 2018-04-22 DIAGNOSIS — E875 Hyperkalemia: Secondary | ICD-10-CM | POA: Diagnosis not present

## 2018-04-22 DIAGNOSIS — D72829 Elevated white blood cell count, unspecified: Secondary | ICD-10-CM | POA: Diagnosis not present

## 2018-04-22 DIAGNOSIS — D751 Secondary polycythemia: Secondary | ICD-10-CM | POA: Diagnosis not present

## 2018-04-22 DIAGNOSIS — D473 Essential (hemorrhagic) thrombocythemia: Secondary | ICD-10-CM | POA: Diagnosis not present

## 2018-04-22 MED ORDER — SODIUM CHLORIDE 0.9 % IV SOLN
INTRAVENOUS | Status: DC
  Administered 2018-04-22: 10:00:00 via INTRAVENOUS
  Filled 2018-04-22: qty 250

## 2018-04-22 MED ORDER — OXYCODONE-ACETAMINOPHEN 7.5-325 MG PO TABS: 1.0000 | ORAL_TABLET | Freq: Three times a day (TID) | ORAL | 0 refills | Status: DC | PRN

## 2018-04-22 MED ORDER — LISINOPRIL 2.5 MG PO TABS: 2.5000 mg | ORAL_TABLET | Freq: Every day | ORAL | 1 refills | Status: DC

## 2018-04-22 MED ORDER — HYDROCHLOROTHIAZIDE 25 MG PO TABS: 25.0000 mg | ORAL_TABLET | Freq: Every day | ORAL | 3 refills | Status: DC

## 2018-04-22 NOTE — Addendum Note (Signed)
Addended by: Sheral Flow on: 04/22/2018 04:09 PM   Modules accepted: Orders

## 2018-04-22 NOTE — Patient Instructions (Signed)
Dehydration, Adult Dehydration is when there is not enough fluid or water in your body. This happens when you lose more fluids than you take in. Dehydration can range from mild to very bad. It should be treated right away to keep it from getting very bad. Symptoms of mild dehydration may include:  Thirst.  Dry lips.  Slightly dry mouth.  Dry, warm skin.  Dizziness. Symptoms of moderate dehydration may include:  Very dry mouth.  Muscle cramps.  Dark pee (urine). Pee may be the color of tea.  Your body making less pee.  Your eyes making fewer tears.  Heartbeat that is uneven or faster than normal (palpitations).  Headache.  Light-headedness, especially when you stand up from sitting.  Fainting (syncope). Symptoms of very bad dehydration may include:  Changes in skin, such as: ? Cold and clammy skin. ? Blotchy (mottled) or pale skin. ? Skin that does not quickly return to normal after being lightly pinched and let go (poor skin turgor).  Changes in body fluids, such as: ? Feeling very thirsty. ? Your eyes making fewer tears. ? Not sweating when body temperature is high, such as in hot weather. ? Your body making very little pee.  Changes in vital signs, such as: ? Weak pulse. ? Pulse that is more than 100 beats a minute when you are sitting still. ? Fast breathing. ? Low blood pressure.  Other changes, such as: ? Sunken eyes. ? Cold hands and feet. ? Confusion. ? Lack of energy (lethargy). ? Trouble waking up from sleep. ? Short-term weight loss. ? Unconsciousness. Follow these instructions at home:  If told by your doctor, drink an ORS: ? Make an ORS by using instructions on the package. ? Start by drinking small amounts, about  cup (120 mL) every 5-10 minutes. ? Slowly drink more until you have had the amount that your doctor said to have.  Drink enough clear fluid to keep your pee clear or pale yellow. If you were told to drink an ORS, finish the ORS  first, then start slowly drinking clear fluids. Drink fluids such as: ? Water. Do not drink only water by itself. Doing that can make the salt (sodium) level in your body get too low (hyponatremia). ? Ice chips. ? Fruit juice that you have added water to (diluted). ? Low-calorie sports drinks.  Avoid: ? Alcohol. ? Drinks that have a lot of sugar. These include high-calorie sports drinks, fruit juice that does not have water added, and soda. ? Caffeine. ? Foods that are greasy or have a lot of fat or sugar.  Take over-the-counter and prescription medicines only as told by your doctor.  Do not take salt tablets. Doing that can make the salt level in your body get too high (hypernatremia).  Eat foods that have minerals (electrolytes). Examples include bananas, oranges, potatoes, tomatoes, and spinach.  Keep all follow-up visits as told by your doctor. This is important. Contact a doctor if:  You have belly (abdominal) pain that: ? Gets worse. ? Stays in one area (localizes).  You have a rash.  You have a stiff neck.  You get angry or annoyed more easily than normal (irritability).  You are more sleepy than normal.  You have a harder time waking up than normal.  You feel: ? Weak. ? Dizzy. ? Very thirsty.  You have peed (urinated) only a small amount of very dark pee during 6-8 hours. Get help right away if:  You have symptoms of   very bad dehydration.  You cannot drink fluids without throwing up (vomiting).  Your symptoms get worse with treatment.  You have a fever.  You have a very bad headache.  You are throwing up or having watery poop (diarrhea) and it: ? Gets worse. ? Does not go away.  You have blood or something green (bile) in your throw-up.  You have blood in your poop (stool). This may cause poop to look black and tarry.  You have not peed in 6-8 hours.  You pass out (faint).  Your heart rate when you are sitting still is more than 100 beats a  minute.  You have trouble breathing. This information is not intended to replace advice given to you by your health care provider. Make sure you discuss any questions you have with your health care provider. Document Released: 04/11/2009 Document Revised: 01/03/2016 Document Reviewed: 08/09/2015 Elsevier Interactive Patient Education  2018 [employer]  

## 2018-04-22 NOTE — Telephone Encounter (Signed)
Due to patient having atherosclerosis and Diabetes Mellitus Recommend she keep taking lisinopril , she can decrease to 2.5mg  which is half a tablet D/C the triamterene HCTZ, instead send in HCTZ 25mg  once a day  She will continue all her other meds, plus the changes above, HCTZ 25mg  daily and lisinopril 1/2 tablet This elevated K is hopefully transient as never an major issue before. Recheck the potassium as scheduled next week and get BP check   Pain medication refilled

## 2018-04-22 NOTE — Progress Notes (Signed)
Pt. Here today for therapeutic phlebotomy and IV fluids Per Dr.'s orders 18 gauge IV placed in the left forearm phlebotomy started at 855 and finished at 925 with 568 units removed  Pt. Tolerated well snack and beverage given Iv fluids given after phlebotomy Pt. Observed during fluids with no incidents.  Pt tolerated well, IV removed with tip intact, VSS

## 2018-04-28 ENCOUNTER — Other Ambulatory Visit: Payer: Self-pay

## 2018-04-28 ENCOUNTER — Ambulatory Visit (INDEPENDENT_AMBULATORY_CARE_PROVIDER_SITE_OTHER): Payer: Medicare HMO | Admitting: Family Medicine

## 2018-04-28 ENCOUNTER — Telehealth: Payer: Self-pay | Admitting: *Deleted

## 2018-04-28 ENCOUNTER — Encounter: Payer: Self-pay | Admitting: Family Medicine

## 2018-04-28 VITALS — BP 122/74 | HR 74 | Temp 98.1°F | Resp 14 | Ht 68.0 in | Wt 227.0 lb

## 2018-04-28 DIAGNOSIS — N189 Chronic kidney disease, unspecified: Secondary | ICD-10-CM | POA: Diagnosis not present

## 2018-04-28 DIAGNOSIS — E875 Hyperkalemia: Secondary | ICD-10-CM

## 2018-04-28 DIAGNOSIS — K047 Periapical abscess without sinus: Secondary | ICD-10-CM | POA: Diagnosis not present

## 2018-04-28 MED ORDER — CHLORHEXIDINE GLUCONATE 0.12 % MT SOLN: 15.0000 mL | Freq: Two times a day (BID) | OROMUCOSAL | 0 refills | Status: DC

## 2018-04-28 MED ORDER — CLINDAMYCIN HCL 300 MG PO CAPS: 300.0000 mg | ORAL_CAPSULE | Freq: Three times a day (TID) | ORAL | 0 refills | Status: AC

## 2018-04-28 MED ORDER — FLUCONAZOLE 150 MG PO TABS: 150.0000 mg | ORAL_TABLET | ORAL | 0 refills | Status: DC | PRN

## 2018-04-28 MED ORDER — CLINDAMYCIN HCL 300 MG PO CAPS: 300.0000 mg | ORAL_CAPSULE | Freq: Three times a day (TID) | ORAL | 0 refills | Status: DC

## 2018-04-28 NOTE — Patient Instructions (Signed)
Take antibiotics for 7 days, do not do full 10 days unless it is improving but not back to baseline. Clindamycin can cause C. Diff infection - call us if you develop severe frequent watery diarrhea.   I will call you with labs  Kidney function looks good last week - I will call you with new labs if we need to adjust meds based on new labs

## 2018-04-28 NOTE — Telephone Encounter (Signed)
Error

## 2018-04-28 NOTE — Telephone Encounter (Signed)
Patient called office. Stated MD did not send order for antibiotic mouthwash when she saw him and she still needs it. Per Dr.Kale verbal order - Peridex, 15 ml BID, swish and spit.

## 2018-04-28 NOTE — Progress Notes (Signed)
Patient ID: Katelyn Cline, female    DOB: 1960/08/10, 58 y.o.   MRN: [id number]  PCP: [name], MD  Chief Complaint  Patient presents with  . Hyperkalemia  . Dental Abscess    Subjective:   Katelyn Cline is a 57 y.o. female, presents to clinic with CC of chronic dental pain and intermittent abscesses and hyperkalemia with need for repeated BMP.  She does have a appointment scheduled with oral surgeon on November 11.  This is her initial evaluation, she has had long history of poor dentition and needs multiple extractions, her PCP who is currently on maternity leave, has been working on getting her the approval and referral to the oral surgeon to be able to address her long-standing dental infections. I saw her March 10, 2018 for acute vaginitis secondary to treatment with antibiotics for her dental infections, she reported that they were still severe at that time, she was referred to the emergency room for evaluation for multiple issues but she ended up having excellent facial CT scans did not show any abscesses at that time but multiple caries and broken teeth.  She reports that she does not currently feel any draining abscesses in her mouth but she feels swelling all along her left lower jaw and left cheek swollen lymph nodes.  Pain is severe despite being on Percocets chronically.   She reports that she cannot take NSAIDs due to worsening kidney function.  She also has elevated potassium which [name] wanted her to have labs repeated this week.  She also reports having difficulty getting hydrated despite drinking several liters of clear fluids a day, is peeing frequently.    Patient Active Problem List   Diagnosis Date Noted  . MPN (myeloproliferative neoplasm) ([place])   . Leukocytosis 03/12/2018  . Symptoms of upper respiratory infection (URI) 03/12/2018  . Dental caries 03/12/2018  . Aortic atherosclerosis ([place]) 02/21/2018  . Constipation 05/28/2015  . OE (otitis  externa) 01/04/2015  . Seborrheic keratoses 08/24/2014  . PVC (premature ventricular contraction) 05/18/2014  . Gastroenteritis 02/15/2014  . Diabetes mellitus type II, controlled ([place]) 02/06/2013  . Chronic hypoxemic respiratory failure ([place]) 02/06/2013  . History of basal cell carcinoma 12/01/2012  . Posttraumatic stress disorder 12/01/2012  . History of malignant melanoma 10/18/2012  . Polycythemia vera ([place]) 08/05/2012  . Hyperlipidemia 04/09/2012  . Narcolepsy 04/09/2012  . Peripheral neuropathy 04/09/2012  . Tobacco use disorder 04/09/2012  . Obesity 04/09/2012  . Essential hypertension, benign 04/09/2012  . DDD (degenerative disc disease), lumbar 02/16/2012  . DDD (degenerative disc disease), cervical 02/16/2012  . Chronic pain disorder 02/16/2012  . Depression 02/16/2012  . Fibromyalgia 02/16/2012     Prior to Admission medications   Medication Sig Start Date End Date Taking? Authorizing Provider  amLODipine (NORVASC) 5 MG tablet TAKE 1 TABLET BY MOUTH EVERY DAY 04/21/18  Yes [place], Modena Nunnery, MD  aspirin EC 81 MG tablet Take 81 mg by mouth daily.   Yes [name], MD  atorvastatin (LIPITOR) 80 MG tablet Take 1 tablet (80 mg total) by mouth daily at 6 PM. 10/19/17  Yes [place], Modena Nunnery, MD  DULoxetine (CYMBALTA) 60 MG capsule TAKE 2 CAPSULES BY MOUTH EVERY DAY 02/25/18  Yes [place], Modena Nunnery, MD  empagliflozin (JARDIANCE) 25 MG TABS tablet Take 25 mg by mouth daily. 04/07/18  Yes Susy Frizzle, MD  fenofibrate (TRICOR) 48 MG tablet TAKE 1 TABLET(48 MG) BY MOUTH DAILY 04/21/18  Yes  [place], Modena Nunnery, MD  fish oil-omega-3 fatty acids 1000 MG capsule Take 2 g by mouth 2 (two) times daily.   Yes [provider]  gabapentin (NEURONTIN) 100 MG capsule TAKE 1 TO 3 CAPSULES(100 TO 300 MG) BY MOUTH THREE TIMES DAILY AS NEEDED FOR NERVE PAIN 02/25/18  Yes [place], Modena Nunnery, MD  Glucose Blood (BLOOD GLUCOSE TEST STRIPS) STRP Please dispense based on insurance and  patient preference. Use as directed to monitor FSBS 1x daily. Dx: E11.9. 10/12/17  Yes [place], Modena Nunnery, MD  hydrochlorothiazide (HYDRODIURIL) 25 MG tablet Take 1 tablet (25 mg total) by mouth daily. 04/22/18  Yes [place], Modena Nunnery, MD  hydroxyurea (HYDREA) 500 MG capsule Take 1 capsule (500 mg total) by mouth daily. May take with food to minimize GI side effects. 03/24/18  Yes [name], MD  lisinopril (ZESTRIL) 2.5 MG tablet Take 1 tablet (2.5 mg total) by mouth daily. 04/22/18  Yes [place], Modena Nunnery, MD  metFORMIN (GLUCOPHAGE) 1000 MG tablet TAKE 1 TABLET BY MOUTH TWICE DAILY WITH A MEAL 04/05/18  Yes [place], Modena Nunnery, MD  MICROLET LANCETS MISC Please dispense based on patient and insurance preference. Use as directed to monitor  FSBS 1x daily. Dx: E11.9. 10/12/17  Yes [place], Modena Nunnery, MD  oxyCODONE-acetaminophen (PERCOCET) 7.5-325 MG tablet Take 1 tablet by mouth 3 (three) times daily as needed for severe pain. 04/22/18  Yes [place], Modena Nunnery, MD  Pseudoeph-Doxylamine-DM-APAP (NYQUIL PO) Take 1-2 capsules by mouth at bedtime as needed (for cold/flu).   Yes [provider]  tiZANidine (ZANAFLEX) 4 MG tablet Take 1 tablet (4 mg total) by mouth 2 (two) times daily as needed for muscle spasms. 02/21/18  Yes [place], Modena Nunnery, MD  valACYclovir (VALTREX) 1000 MG tablet Take 1 tablet (1,000 mg total) by mouth 2 (two) times daily. 04/12/18  Yes Orlena Sheldon, PA-C     Allergies  Allergen Reactions  . Contrast Media [Iodinated Diagnostic Agents] Anaphylaxis  . Erythromycin   . Flagyl [Metronidazole] Other (See Comments)    Generalized pain.  . Latex Other (See Comments)    Was told to be careful because of Dye allergy  . Other     Iodine Dye  . Penicillins Other (See Comments)    Patient was an infant, no idea of reaction. Tolerates Keflex.  . Tetracyclines & Related Other (See Comments)    GI side effects     Review of Systems  Constitutional: Negative.   HENT:  Negative.   Eyes: Negative.   Respiratory: Negative.   Cardiovascular: Negative.   Gastrointestinal: Negative.   Endocrine: Negative.   Genitourinary: Negative.   Musculoskeletal: Negative.   Skin: Negative.   Allergic/Immunologic: Negative.   Neurological: Negative.   Hematological: Negative.   Psychiatric/Behavioral: Negative.   All other systems reviewed and are negative.      Objective:    Vitals:   04/28/18 0816  BP: 122/74  Pulse: 74  Resp: 14  Temp: 98.1 F (36.7 C)  TempSrc: Oral  SpO2: 98%  Weight: 227 lb (103 kg)  Height: 5\' 8"  (1.727 m)      Physical Exam  Constitutional: She appears well-developed and well-nourished. No distress.  HENT:  Head: Normocephalic and atraumatic.  Nose: Nose normal.  Mouth/Throat: Uvula is midline, oropharynx is clear and moist and mucous membranes are normal. No oral lesions. No trismus in the jaw. Abnormal dentition. No dental abscesses or uvula swelling. No oropharyngeal exudate, posterior oropharyngeal edema, posterior oropharyngeal  erythema or tonsillar abscesses.  Tenderness to palpation along lower left teeth and gum lines and also tender to palpation to left lower jaw and left lower cheek with mild edema palpated, no induration, no dental abscess or fluctuance found No trismus, no lymphadenopathy palpated, no sublingual fullness, induration or tenderness to palpation  Eyes: Conjunctivae are normal. Right eye exhibits no discharge. Left eye exhibits no discharge.  Neck: No tracheal deviation present.  Cardiovascular: Normal rate and regular rhythm.  Pulmonary/Chest: Effort normal. No stridor. No respiratory distress.  Musculoskeletal: Normal range of motion.  Neurological: She is alert. She exhibits normal muscle tone. Coordination normal.  Skin: Skin is warm and dry. No rash noted. She is not diaphoretic.  Psychiatric: She has a normal mood and affect. Her behavior is normal.  Nursing note and vitals reviewed.          Assessment & Plan:      ICD-10-CM   1. Hyperkalemia Y63.7 BASIC METABOLIC PANEL WITH GFR  2. Chronic kidney disease, unspecified CKD stage N18.9   3. Chronic dental infection K04.7     Chronic dental infection, patient given clindamycin for 7 days with a few extra days to allow her to get to her oral surgery appointment if there is any remaining infection, currently no dental abscess identified, no need for Ludwig angina, is mild signs of infection in her gum and left cheek, airway intact, patient afebrile with stable vital signs.  Diflucan given to use as needed with 2 doses if she develops yeast vaginitis symptoms secondary to antibiotics.  Reviewed the risk of C. difficile with clindamycin  She is due for 1 week recheck of BMP from hematology/oncology, potassium high last week.     Delsa Grana, PA-C 04/28/18 8:49 AM

## 2018-04-29 DIAGNOSIS — C3490 Malignant neoplasm of unspecified part of unspecified bronchus or lung: Secondary | ICD-10-CM | POA: Diagnosis not present

## 2018-04-29 DIAGNOSIS — J9611 Chronic respiratory failure with hypoxia: Secondary | ICD-10-CM | POA: Diagnosis not present

## 2018-04-29 LAB — BASIC METABOLIC PANEL WITH GFR
BUN: 24 mg/dL (ref 7–25)
CALCIUM: 9.8 mg/dL (ref 8.6–10.4)
CHLORIDE: 104 mmol/L (ref 98–110)
CO2: 28 mmol/L (ref 20–32)
Creat: 0.7 mg/dL (ref 0.50–1.05)
GFR, EST NON AFRICAN AMERICAN: 95 mL/min/{1.73_m2} (ref >=60)
GFR, Est African American: 110 mL/min/{1.73_m2} (ref >=60)
GLUCOSE: 108 mg/dL — AB (ref 65–99)
POTASSIUM: 5.1 mmol/L (ref 3.5–5.3)
SODIUM: 143 mmol/L (ref 135–146)

## 2018-04-29 LAB — EXTRA LAV TOP TUBE

## 2018-05-02 ENCOUNTER — Other Ambulatory Visit: Payer: Self-pay | Admitting: Family Medicine

## 2018-05-10 ENCOUNTER — Telehealth: Payer: Self-pay | Admitting: *Deleted

## 2018-05-10 NOTE — Telephone Encounter (Signed)
Received call from patient. Inquired as to results from prior labs. Advised that labs WNL and results were released to MyChart.   Also states that she is having some lower flank pain. States that urine is clear and non-odorous. States that she will call to schedule appointment if any sx persist.

## 2018-05-18 ENCOUNTER — Other Ambulatory Visit: Payer: Self-pay | Admitting: *Deleted

## 2018-05-18 ENCOUNTER — Other Ambulatory Visit: Payer: Self-pay | Admitting: Family Medicine

## 2018-05-18 DIAGNOSIS — D45 Polycythemia vera: Secondary | ICD-10-CM

## 2018-05-18 DIAGNOSIS — G894 Chronic pain syndrome: Secondary | ICD-10-CM

## 2018-05-18 MED ORDER — OXYCODONE-ACETAMINOPHEN 7.5-325 MG PO TABS: 1.0000 | ORAL_TABLET | Freq: Three times a day (TID) | ORAL | 0 refills | Status: DC | PRN

## 2018-05-18 NOTE — Telephone Encounter (Signed)
Ok to refill??  Last office visit 04/28/2018.  Last refill 04/22/2018.

## 2018-05-18 NOTE — Telephone Encounter (Signed)
Refill on oxycodone to [facility]  °

## 2018-05-18 NOTE — Progress Notes (Signed)
HEMATOLOGY/ONCOLOGY CONSULTATION NOTE  Date of Service: 05/19/2018  Patient Care Team: [facility], Modena Nunnery, MD as PCP - General (Family Medicine) Fanny Bien, MD as Attending Physician (Family Medicine) Everardo All, MD (Hematology and Oncology) [name], MD as Attending Physician (Cardiothoracic Surgery)  CHIEF COMPLAINTS/PURPOSE OF CONSULTATION:  Continue mx of Jak2 mutation positive MPN  HISTORY OF PRESENTING ILLNESS:   Katelyn Cline is a wonderful 57 y.o. female who has been referred to [place] by Dr. Tana Coast  for evaluation and management of Leukocytosis. The pt reports that she is doing well overall.   The pt presented to the ED on 03/11/18 with significant weakness, hyperkalemia, and increasing leukocytosis with her PCP. She also notes that she had abscesses and facial infections about 4 weeks ago and received Clindamycin. She then developed a vaginal yeast infection, treated with Monistat and worsened to lesions and sores. The pt notes that since beginning antibiotics her cough has improved.   Patient report having a h/o melanoma metastatic to lung s/pwedge resection in 2014 . No recurrence since then. She notes she was been monitored with CT scans since then.  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  Patient notes that she previous needed therapeutic phlebotomy along time back but  denies ever taking medication for her polycythemia vera.   The pt notes that she used to sleep with a CPAP for many years. She then began treatment for her narcolepsy. The pt notes that she is on O2 and has remained on O2 since her surgery in 2014 to resect the melanoma. She has degenerative disc disease, fibromyalgia, and neuropathy in her feet.   The pt notes that she feels less "groggy-headed" today.    Most recent lab results (03/14/18) of CBC and BMP is as follows: all values are WNL except for WBC at 31.0k, RBC at 6.97, HCT at 47.1, MCV at 67.6, MCH at 19.7, MCHC at 29.1, RDW at 22.3, PLT at 542k, Glucose at 138.  On review of systems, pt reports recent infections, feeling tired, moving her bowels well, resolved cough and denies abdominal pains, problems passing urine, and any other symptoms.   On PMHx the pt reports basal cell carcinoma in 2013, metastatic melanoma to lung wedge resected with thoracoscopy on 10/21/12.  Interval History:   Katelyn Cline returns today for management and evaluation of her polycythemia vera. The patient's last visit with [place] was on 04/21/18. The pt reports that she is doing well overall.   The pt reports that she has been staying very well hydrated. She notes that she has continued to take 566m Hydroxyurea every day and denies missing any days in the interim.   The pt notes that she has continued using Peridex and completed two courses of antibiotics for her dental abscess. She is continuing to follow up with her dentist regarding impending dental extractions.   The pt notes she is continuing to smoke, now down to 1ppd. She notes that she is trying to quit.   Lab results today (05/19/18) of CBC w/diff, CMP is as follows: all values are WNL except for WBC at 25.2k, RBC at 6.46, MCV at 70.4, MCH at 19.7, MCHC at 27.9, RDW at 23.9, PLT at 565k, nRBC at 0.4%, ANC at 21.2k, Monocytes abs at 1.1k, Basophils abs at 200,  Abs immature granulocytes at 0.37k, Glucose at 152, Albumin at 3.2, AST at 10, Alk Phos at 142.   On review of systems, pt reports staying well hydrated, eating well, cleared dental infection, stable energy levels, and denies abdominal pains, and any other symptoms.   MEDICAL HISTORY:  Past Medical History:  Diagnosis Date  . Agoraphobia   . Basal cell carcinoma   . Chronic pain   . Chronic respiratory failure with hypoxia ([name])    3L [place]  .  DM type 2 (diabetes mellitus, type 2) ([place]) 10/16/2013  . Dysrhythmia    Hx: of palpitations "a long time ago"  . Elevated hemoglobin ([place]) 2014  . Fibromyalgia   . History of UTI   . HTN (hypertension)   . Hyperlipidemia   . Major depression   . Metastatic melanoma ([place]) 10/18/2012   12 mm posterior right upper lobe pulmonary nodule, max SUV 3.0    . Migraines   . Miscarriage    x 4  . Narcolepsy   . Panic attacks   . Peripheral neuropathy   . Peripheral neuropathy    in feet  . Polycythemia secondary to smoking   . Polycythemia vera(238.4)   . Scoliosis   . Shortness of breath    Hx: of with activity  . Sleep apnea   . Vertigo     SURGICAL HISTORY: Past Surgical History:  Procedure Laterality Date  . BASAL CELL CARCINOMA EXCISION     flap surgery on face  . BREAST LUMPECTOMY     bilateral  . COLONOSCOPY W/ BIOPSIES AND POLYPECTOMY     Hx: of  . CYSTECTOMY     abdominal wall   . DILATION AND CURETTAGE OF UTERUS     Hx: of   . VAGINAL HYSTERECTOMY    . VIDEO ASSISTED THORACOSCOPY (VATS)/WEDGE RESECTION Right 10/21/2012   Procedure: VIDEO ASSISTED THORACOSCOPY (VATS)/WEDGE RESECTION;  Surgeon: [name], MD;  Location: [facility];  Service: Thoracic;  Laterality: Right;  Marland Kitchen VIDEO BRONCHOSCOPY N/A 10/21/2012   Procedure: VIDEO BRONCHOSCOPY;  Surgeon: [name], MD;  Location: [facility];  Service: Thoracic;  Laterality: N/A;    SOCIAL HISTORY: Social History   Socioeconomic History  . Marital status: Divorced    Spouse name: Not on file  . Number of children: Not on file  . Years of education: 12+  . Highest education level: Not on file  Occupational History  . Not on file  Social Needs  . Financial resource strain: Not on file  . Food insecurity:    Worry: Not on file    Inability: Not on file  . Transportation needs:    Medical: Not on file    Non-medical: Not on file  Tobacco Use  . Smoking status: Current Every Day Smoker    Packs/day: 1.00     Years: 10.00    Pack years: 10.00    Types: Cigarettes    Last attempt to quit: 09/16/2012    Years since quitting: 5.6  . Smokeless tobacco: Never Used  Substance and Sexual Activity  . Alcohol use: Yes    Comment: 1 -2 drinks a month, occasional  . Drug use: No  . Sexual activity: Not Currently  Lifestyle  . Physical activity:    Days per week: Not on file    Minutes per session: Not on file  . Stress: Not on file  Relationships  . Social connections:    Talks on phone: Not on  file    Gets together: Not on file    Attends religious service: Not on file    Active member of club or organization: Not on file    Attends meetings of clubs or organizations: Not on file    Relationship status: Not on file  . Intimate partner violence:    Fear of current or ex partner: Not on file    Emotionally abused: Not on file    Physically abused: Not on file    Forced sexual activity: Not on file  Other Topics Concern  . Not on file  Social History Narrative  . Not on file    FAMILY HISTORY: Family History  Problem Relation Age of Onset  . Arthritis Mother   . Hyperlipidemia Mother   . Depression Mother   . Anxiety disorder Mother   . Dementia Mother   . Hypertension Father   . Hyperlipidemia Father   . Heart disease Father   . Stroke Father   . Dementia Father   . Heart disease Brother   . ADD / ADHD Son   . Alcohol abuse Maternal Grandfather   . Bipolar disorder Neg Hx   . Drug abuse Neg Hx   . OCD Neg Hx   . Paranoid behavior Neg Hx   . Schizophrenia Neg Hx   . Seizures Neg Hx   . Sexual abuse Neg Hx   . Physical abuse Neg Hx     ALLERGIES:  is allergic to contrast media [iodinated diagnostic agents]; erythromycin; flagyl [metronidazole]; latex; other; penicillins; and tetracyclines & related.  MEDICATIONS:  Current Outpatient Medications  Medication Sig Dispense Refill  . amLODipine (NORVASC) 5 MG tablet TAKE 1 TABLET BY MOUTH EVERY DAY 90 tablet 0  . aspirin EC  81 MG tablet Take 81 mg by mouth daily.    Marland Kitchen atorvastatin (LIPITOR) 80 MG tablet Take 1 tablet (80 mg total) by mouth daily at 6 PM. 90 tablet 3  . chlorhexidine (PERIDEX) 0.12 % solution Use as directed 15 mLs in the mouth or throat 2 (two) times daily. Swish and spit. Do not swallow. 473 mL 0  . DULoxetine (CYMBALTA) 60 MG capsule TAKE 2 CAPSULES BY MOUTH EVERY DAY 60 capsule 2  . empagliflozin (JARDIANCE) 25 MG TABS tablet Take 25 mg by mouth daily. 90 tablet 4  . fenofibrate (TRICOR) 48 MG tablet TAKE 1 TABLET(48 MG) BY MOUTH DAILY 90 tablet 0  . fish oil-omega-3 fatty acids 1000 MG capsule Take 2 g by mouth 2 (two) times daily.    . fluconazole (DIFLUCAN) 150 MG tablet Take 1 tablet (150 mg total) by mouth every 3 (three) days as needed for up to 2 doses. 2 tablet 0  . gabapentin (NEURONTIN) 100 MG capsule TAKE 1 TO 3 CAPSULES(100 TO 300 MG) BY MOUTH THREE TIMES DAILY AS NEEDED FOR NERVE PAIN 90 capsule 2  . gabapentin (NEURONTIN) 100 MG capsule TAKE 1 TO 3 CAPSULES(100 TO 300 MG) BY MOUTH THREE TIMES DAILY AS NEEDED FOR NERVE PAIN 90 capsule 0  . Glucose Blood (BLOOD GLUCOSE TEST STRIPS) STRP Please dispense based on insurance and patient preference. Use as directed to monitor FSBS 1x daily. Dx: E11.9. 100 each 3  . hydrochlorothiazide (HYDRODIURIL) 25 MG tablet Take 1 tablet (25 mg total) by mouth daily. 90 tablet 3  . hydroxyurea (HYDREA) 500 MG capsule Take 2 caps (1000 mg) on Monday, Wednesday and Friday and 1 cap (576m) the rest of the days. May take  with food to minimize nausea. 60 capsule 2  . lisinopril (ZESTRIL) 2.5 MG tablet Take 1 tablet (2.5 mg total) by mouth daily. 90 tablet 1  . metFORMIN (GLUCOPHAGE) 1000 MG tablet TAKE 1 TABLET BY MOUTH TWICE DAILY WITH A MEAL 180 tablet 0  . MICROLET LANCETS MISC Please dispense based on patient and insurance preference. Use as directed to monitor  FSBS 1x daily. Dx: E11.9. 100 each 11  . oxyCODONE-acetaminophen (PERCOCET) 7.5-325 MG tablet  Take 1 tablet by mouth 3 (three) times daily as needed for severe pain. 90 tablet 0  . Pseudoeph-Doxylamine-DM-APAP (NYQUIL PO) Take 1-2 capsules by mouth at bedtime as needed (for cold/flu).    Marland Kitchen tiZANidine (ZANAFLEX) 4 MG tablet Take 1 tablet (4 mg total) by mouth 2 (two) times daily as needed for muscle spasms. 60 tablet 2  . valACYclovir (VALTREX) 1000 MG tablet Take 1 tablet (1,000 mg total) by mouth 2 (two) times daily. 20 tablet 0   No current facility-administered medications for this visit.     REVIEW OF SYSTEMS:    A 10+ POINT REVIEW OF SYSTEMS WAS OBTAINED including neurology, dermatology, psychiatry, cardiac, respiratory, lymph, extremities, GI, GU, Musculoskeletal, constitutional, breasts, reproductive, HEENT.  All pertinent positives are noted in the HPI.  All others are negative.   PHYSICAL EXAMINATION: ECOG PERFORMANCE STATUS: 2 - Symptomatic, <50% confined to bed  . Vitals:   05/19/18 0845  BP: (!) 108/46  Pulse: 73  Resp: 18  Temp: 98.3 F (36.8 C)  SpO2: 93%   Filed Weights   05/19/18 0845  Weight: 226 lb (102.5 kg)   .Body mass index is 34.36 kg/m.  GENERAL:alert, in no acute distress and comfortable SKIN: no acute rashes, no significant lesions EYES: conjunctiva are pink and non-injected, sclera anicteric OROPHARYNX: MMM, no exudates, no oropharyngeal erythema or ulceration NECK: supple, no JVD LYMPH:  no palpable lymphadenopathy in the cervical, axillary or inguinal regions LUNGS: clear to auscultation b/l with normal respiratory effort HEART: regular rate & rhythm ABDOMEN:  normoactive bowel sounds , non tender, not distended. Splenomegaly 3 fingerbreadths under costal margin Extremity: no pedal edema PSYCH: alert & oriented x 3 with fluent speech NEURO: no focal motor/sensory deficits   LABORATORY DATA:  I have reviewed the data as listed  . CBC Latest Ref Rng & Units 05/19/2018 04/21/2018 03/24/2018  WBC 4.0 - 10.5 K/uL 25.2(H) 18.3(H) 25.4(H)   Hemoglobin 12.0 - 15.0 g/dL 12.7 14.3 14.0  Hematocrit 36.0 - 46.0 % 45.5 51.8(H) 47.6(H)  Platelets 150 - 400 K/uL 565(H) 307 725(H)   . CBC    Component Value Date/Time   WBC 25.2 (H) 05/19/2018 0811   WBC 18.3 (H) 04/21/2018 0858   RBC 6.46 (H) 05/19/2018 0811   HGB 12.7 05/19/2018 0811   HCT 45.5 05/19/2018 0811   PLT 565 (H) 05/19/2018 0811   MCV 70.4 (L) 05/19/2018 0811   MCH 19.7 (L) 05/19/2018 0811   MCHC 27.9 (L) 05/19/2018 0811   RDW 23.9 (H) 05/19/2018 0811   LYMPHSABS 1.8 05/19/2018 0811   MONOABS 1.1 (H) 05/19/2018 0811   EOSABS 0.5 05/19/2018 0811   BASOSABS 0.2 (H) 05/19/2018 4580     . CMP Latest Ref Rng & Units 05/19/2018 04/28/2018 04/21/2018  Glucose 70 - 99 mg/dL 152(H) 108(H) 140(H)  BUN 6 - 20 mg/dL 18 24 37(H)  Creatinine 0.44 - 1.00 mg/dL 0.76 0.70 1.08(H)  Sodium 135 - 145 mmol/L 140 143 142  Potassium 3.5 - 5.1 mmol/L  4.5 5.1 5.6(H)  Chloride 98 - 111 mmol/L 102 104 105  CO2 22 - 32 mmol/L _0 Calcium 8.9 - 10.3 mg/dL 9.8 9.8 9.6  Total Protein 6.5 - 8.1 g/dL 7.5 - 7.3  Total Bilirubin 0.3 - 1.2 mg/dL 0.3 - 0.3  Alkaline Phos 38 - 126 U/L 142(H) - 155(H)  AST 15 - 41 U/L 10(L) - 13(L)  ALT 0 - 44 U/L 10 - 15   08/15/12 JAK2 Mutation:     03/15/18 BM Bx:      RADIOGRAPHIC STUDIES: I have personally reviewed the radiological images as listed and agreed with the findings in the report. No results found.  ASSESSMENT & PLAN:   57 y.o. female with  1. Jak2 V617F mutation positive Myeloproliferative syndrome.- BM Bx consistent with Polycythemia Vera  JAK2 mutation identified in 08/15/12 labs   03/15/18 BM Bx revealed results consistent with JAK2 positive polycythemia vera   Current hgb.hct are at 14/47.6 Thrombocytosis PLT 725k  2. Increasing leucocytosis - primarily neutrophilia. - related to Polycythemia vera Patient with recent dental infections/abscesses and respiratory infection. Part of Leukocytosis could be due to his  MPN  - PCV vs Myelofibrosis.  Neutrophilia began in March 2015 at 9.6k, slowly increased to 14k in August 2018, 19k in March 2019, now to [place] in September 2019.  3. Polycythemia vera Confirmed by 08/15/12 JAK 2 mutation test    PLAN:  -Discussed pt labwork today, 05/19/18; WBC increased to 25.2k, HCT normalized to 45.5%, PLT increased to 565k. Potassium normalized to 4.5, Creatinine normalized.  -PLT not currently at goal, which is to have PLT<400k -HCT goal of HCT<45%, pt is currently just above goal -Will increase pt to 1014m Hydroxyurea on MWF, and 5027mHydroxyurea the other 4 days of the week -Proceed with therapeutic phlebotomy today -Will see the pt back in 6-7 weeks  -Recommended that the pt continue to abide by sun protection strategies as Hydroxyurea can sensitize the skin  -continue ASA 8156mo daily  -Counseled the pt towards complete smoking cessation and explained that this would serve as a cause for increased polycythemia and strokes -Recommend Peridex mouthwash -encouraged her to maintenance compliance with her medications  4. History of Metastatic melanoma to the right lung  S/p resection by Dr. GerServando Snare 10/21/12, BRAF mutation not detected. -continue q6mo106monthkin screening her PCP/dermatologist -symptom directed radiologic scanning.  RTC with Dr KaleIrene Limbo7 weeks with labs and appointment for therapeutic phlebotomy   All of the patients questions were answered with apparent satisfaction. The patient knows to call the clinic with any problems, questions or concerns.  The total time spent in the appt was 25 minutes and more than 50% was on counseling and direct patient cares.    GautSullivan LoneMS AAHIVMS SCH [facility] Out[facility] Surgery[facility]ology/Oncology Physician [facility]):       [phone] cell):  [phone]):           [phone]/21/2019 9:22 AM  I, SchuBaldwin [place] acting as a scribe for Dr. GautSullivan Lone.I have reviewed  the above documentation for accuracy and completeness, and I agree with the above. .GauBrunetta Genera

## 2018-05-19 ENCOUNTER — Inpatient Hospital Stay: Payer: Medicare HMO

## 2018-05-19 ENCOUNTER — Encounter: Payer: Self-pay | Admitting: Hematology

## 2018-05-19 ENCOUNTER — Inpatient Hospital Stay: Payer: Medicare HMO | Attending: Hematology

## 2018-05-19 ENCOUNTER — Inpatient Hospital Stay (HOSPITAL_BASED_OUTPATIENT_CLINIC_OR_DEPARTMENT_OTHER): Payer: Medicare HMO | Admitting: Hematology

## 2018-05-19 VITALS — BP 152/67 | HR 74 | Temp 98.5°F | Resp 18

## 2018-05-19 VITALS — BP 108/46 | HR 73 | Temp 98.3°F | Resp 18 | Ht 68.0 in | Wt 226.0 lb

## 2018-05-19 DIAGNOSIS — J449 Chronic obstructive pulmonary disease, unspecified: Secondary | ICD-10-CM

## 2018-05-19 DIAGNOSIS — I10 Essential (primary) hypertension: Secondary | ICD-10-CM | POA: Diagnosis not present

## 2018-05-19 DIAGNOSIS — D72829 Elevated white blood cell count, unspecified: Secondary | ICD-10-CM

## 2018-05-19 DIAGNOSIS — Z79899 Other long term (current) drug therapy: Secondary | ICD-10-CM | POA: Diagnosis not present

## 2018-05-19 DIAGNOSIS — G47419 Narcolepsy without cataplexy: Secondary | ICD-10-CM | POA: Insufficient documentation

## 2018-05-19 DIAGNOSIS — R531 Weakness: Secondary | ICD-10-CM | POA: Diagnosis not present

## 2018-05-19 DIAGNOSIS — M797 Fibromyalgia: Secondary | ICD-10-CM | POA: Insufficient documentation

## 2018-05-19 DIAGNOSIS — D45 Polycythemia vera: Secondary | ICD-10-CM | POA: Insufficient documentation

## 2018-05-19 DIAGNOSIS — Z85118 Personal history of other malignant neoplasm of bronchus and lung: Secondary | ICD-10-CM | POA: Insufficient documentation

## 2018-05-19 DIAGNOSIS — Z85828 Personal history of other malignant neoplasm of skin: Secondary | ICD-10-CM | POA: Diagnosis not present

## 2018-05-19 DIAGNOSIS — D471 Chronic myeloproliferative disease: Secondary | ICD-10-CM | POA: Diagnosis not present

## 2018-05-19 DIAGNOSIS — D473 Essential (hemorrhagic) thrombocythemia: Secondary | ICD-10-CM

## 2018-05-19 DIAGNOSIS — E1140 Type 2 diabetes mellitus with diabetic neuropathy, unspecified: Secondary | ICD-10-CM | POA: Diagnosis not present

## 2018-05-19 DIAGNOSIS — D751 Secondary polycythemia: Secondary | ICD-10-CM | POA: Diagnosis not present

## 2018-05-19 DIAGNOSIS — F17210 Nicotine dependence, cigarettes, uncomplicated: Secondary | ICD-10-CM | POA: Diagnosis not present

## 2018-05-19 DIAGNOSIS — E785 Hyperlipidemia, unspecified: Secondary | ICD-10-CM | POA: Insufficient documentation

## 2018-05-19 DIAGNOSIS — Z7984 Long term (current) use of oral hypoglycemic drugs: Secondary | ICD-10-CM

## 2018-05-19 DIAGNOSIS — Z85820 Personal history of malignant melanoma of skin: Secondary | ICD-10-CM

## 2018-05-19 DIAGNOSIS — D75839 Thrombocytosis, unspecified: Secondary | ICD-10-CM

## 2018-05-19 DIAGNOSIS — G4730 Sleep apnea, unspecified: Secondary | ICD-10-CM

## 2018-05-19 DIAGNOSIS — Z7982 Long term (current) use of aspirin: Secondary | ICD-10-CM | POA: Insufficient documentation

## 2018-05-19 LAB — CMP (CANCER CENTER ONLY)
ALT: 10 U/L (ref 0–44)
ANION GAP: 10 (ref 5–15)
AST: 10 U/L — ABNORMAL LOW (ref 15–41)
Albumin: 3.2 g/dL — ABNORMAL LOW (ref 3.5–5.0)
Alkaline Phosphatase: 142 U/L — ABNORMAL HIGH (ref 38–126)
BILIRUBIN TOTAL: 0.3 mg/dL (ref 0.3–1.2)
BUN: 18 mg/dL (ref 6–20)
CHLORIDE: 102 mmol/L (ref 98–111)
CO2: 28 mmol/L (ref 22–32)
Calcium: 9.8 mg/dL (ref 8.9–10.3)
Creatinine: 0.76 mg/dL (ref 0.44–1.00)
GFR, Est AFR Am: >60 mL/min (ref >60)
Glucose, Bld: 152 mg/dL — ABNORMAL HIGH (ref 70–99)
Potassium: 4.5 mmol/L (ref 3.5–5.1)
Sodium: 140 mmol/L (ref 135–145)
Total Protein: 7.5 g/dL (ref 6.5–8.1)

## 2018-05-19 LAB — CBC WITH DIFFERENTIAL (CANCER CENTER ONLY)
ABS IMMATURE GRANULOCYTES: 0.37 10*3/uL — AB (ref 0.00–0.07)
BASOS ABS: 0.2 10*3/uL — AB (ref 0.0–0.1)
Basophils Relative: 1 %
Eosinophils Absolute: 0.5 10*3/uL (ref 0.0–0.5)
Eosinophils Relative: 2 %
HCT: 45.5 % (ref 36.0–46.0)
Hemoglobin: 12.7 g/dL (ref 12.0–15.0)
IMMATURE GRANULOCYTES: 2 %
LYMPHS PCT: 7 %
Lymphs Abs: 1.8 10*3/uL (ref 0.7–4.0)
MCH: 19.7 pg — AB (ref 26.0–34.0)
MCHC: 27.9 g/dL — ABNORMAL LOW (ref 30.0–36.0)
MCV: 70.4 fL — ABNORMAL LOW (ref 80.0–100.0)
Monocytes Absolute: 1.1 10*3/uL — ABNORMAL HIGH (ref 0.1–1.0)
Monocytes Relative: 4 %
NEUTROS ABS: 21.2 10*3/uL — AB (ref 1.7–7.7)
NEUTROS PCT: 84 %
NRBC: 0.4 % — AB (ref 0.0–0.2)
PLATELETS: 565 10*3/uL — AB (ref 150–400)
RBC: 6.46 MIL/uL — AB (ref 3.87–5.11)
RDW: 23.9 % — ABNORMAL HIGH (ref 11.5–15.5)
WBC: 25.2 10*3/uL — AB (ref 4.0–10.5)

## 2018-05-19 MED ORDER — HYDROXYUREA 500 MG PO CAPS: ORAL_CAPSULE | ORAL | 2 refills | Status: DC

## 2018-05-19 MED ORDER — CHLORHEXIDINE GLUCONATE 0.12 % MT SOLN: 15.0000 mL | Freq: Two times a day (BID) | OROMUCOSAL | 0 refills | Status: DC

## 2018-05-19 NOTE — Progress Notes (Signed)
Katelyn Cline presents today for phlebotomy per MD orders. 1 Failed PIV attempt to RFA. Phlebotomy procedure started at 0957 with 18 gauge PIV inserted to RAC and ended at 1005 with 60 grams removed. IV needle removed intact. Procedure restarted at 1010 with 18 gauge PIV inserted to LFA and ended at 1020 with 20 grams removed. IV needle removed intact. Each procedure attempt got clotted producing a total of 80 grams today. [name] notified of difficulty with blood flow and failed 3 attempts. He advised patient can leave and increase fluid intake before her next visit. Patient informed and she verbalized understanding. Patient observed for 30 minutes after procedure without any incident. Food and drinks offered. Patient tolerated procedure well.

## 2018-05-19 NOTE — Patient Instructions (Signed)

## 2018-05-19 NOTE — Patient Instructions (Addendum)
Take 2 pills of your 500mg  Hydroxyurea on Monday, Wednesday and Friday every week. Take 1 pill of your 500mg  Hydroxyurea on Tuesday, Thursday, Saturday and Sunday  Continue staying well hydrated Continue doing your best to decrease, and quit, smoking

## 2018-05-23 ENCOUNTER — Telehealth: Payer: Self-pay | Admitting: Hematology

## 2018-05-23 NOTE — Telephone Encounter (Signed)
Left message re January 2020 appointments. Schedule mailed.

## 2018-05-29 DIAGNOSIS — C3490 Malignant neoplasm of unspecified part of unspecified bronchus or lung: Secondary | ICD-10-CM | POA: Diagnosis not present

## 2018-05-29 DIAGNOSIS — J9611 Chronic respiratory failure with hypoxia: Secondary | ICD-10-CM | POA: Diagnosis not present

## 2018-06-16 ENCOUNTER — Other Ambulatory Visit: Payer: Self-pay | Admitting: *Deleted

## 2018-06-16 DIAGNOSIS — G894 Chronic pain syndrome: Secondary | ICD-10-CM

## 2018-06-16 NOTE — Telephone Encounter (Signed)
Received call from patient.   Requested refill on Oxycodone/APAP.   Ok to refill??  Last office visit 04/28/2018.  Last refill 05/18/2018.

## 2018-06-17 ENCOUNTER — Other Ambulatory Visit: Payer: Self-pay | Admitting: Family Medicine

## 2018-06-17 MED ORDER — OXYCODONE-ACETAMINOPHEN 7.5-325 MG PO TABS: 1.0000 | ORAL_TABLET | Freq: Three times a day (TID) | ORAL | 0 refills | Status: DC | PRN

## 2018-06-17 NOTE — Telephone Encounter (Signed)
Ok to refill Zanaflex? 

## 2018-06-27 ENCOUNTER — Ambulatory Visit: Payer: Medicare HMO | Admitting: Family Medicine

## 2018-06-27 ENCOUNTER — Encounter: Payer: Self-pay | Admitting: Family Medicine

## 2018-06-27 DIAGNOSIS — M5136 Other intervertebral disc degeneration, lumbar region: Secondary | ICD-10-CM | POA: Diagnosis not present

## 2018-06-27 DIAGNOSIS — Z79899 Other long term (current) drug therapy: Secondary | ICD-10-CM | POA: Diagnosis not present

## 2018-06-27 DIAGNOSIS — M129 Arthropathy, unspecified: Secondary | ICD-10-CM | POA: Diagnosis not present

## 2018-06-29 DIAGNOSIS — J9611 Chronic respiratory failure with hypoxia: Secondary | ICD-10-CM | POA: Diagnosis not present

## 2018-06-29 DIAGNOSIS — C3490 Malignant neoplasm of unspecified part of unspecified bronchus or lung: Secondary | ICD-10-CM | POA: Diagnosis not present

## 2018-07-06 NOTE — Progress Notes (Signed)
HEMATOLOGY/ONCOLOGY CLINIC NOTE  Date of Service: 07/07/2018  Patient Care Team: [facility], [place] Nunnery, MD as PCP - General (Family Medicine) Fanny Bien, MD as Attending Physician (Family Medicine) Katelyn All, MD (Hematology and Oncology) [name], MD as Attending Physician (Cardiothoracic Surgery)  CHIEF COMPLAINTS/PURPOSE OF CONSULTATION:  Continue mx of Jak2 mutation positive MPN  HISTORY OF PRESENTING ILLNESS:   Katelyn Cline is a wonderful 58 y.o. female who has been referred to [place] by Dr. Tana Coast  for evaluation and management of Leukocytosis. The pt reports that she is doing well overall.   The pt presented to the ED on 03/11/18 with significant weakness, hyperkalemia, and increasing leukocytosis with her PCP. She also notes that she had abscesses and facial infections about 4 weeks ago and received Clindamycin. She then developed a vaginal yeast infection, treated with Monistat and worsened to lesions and sores. The pt notes that since beginning antibiotics her cough has improved.   Patient report having a h/o melanoma metastatic to lung s/pwedge resection in 2014 . No recurrence since then. She notes she was been monitored with CT scans since then.  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  Patient notes that she previous needed therapeutic phlebotomy along time back but  denies ever taking medication for her polycythemia vera.   The pt notes that she used to sleep with a CPAP for many years. She then began treatment for her narcolepsy. The pt notes that she is on O2 and has remained on O2 since her surgery in 2014 to resect the melanoma. She has degenerative disc disease, fibromyalgia, and neuropathy in her feet.   The pt notes that she feels less "groggy-headed" today.   Most  recent lab results (03/14/18) of CBC and BMP is as follows: Cline values are WNL except for WBC at 31.0k, RBC at 6.97, HCT at 47.1, MCV at 67.6, MCH at 19.7, MCHC at 29.1, RDW at 22.3, PLT at 542k, Glucose at 138.  On review of systems, pt reports recent infections, feeling tired, moving her bowels well, resolved cough and denies abdominal pains, problems passing urine, and any other symptoms.   On PMHx the pt reports basal cell carcinoma in 2013, metastatic melanoma to lung wedge resected with thoracoscopy on 10/21/12.  Interval History:   Katelyn LANDECK returns today for management and evaluation of her polycythemia vera. The patient's last visit with [place] was on 05/19/18. The pt reports that she is doing well overall.   The pt reports that she has had more fatigue in the interim as well as headaches. She notes that she has been drinking lots of water. She continues taking Hydroxyurea at night and denies any problems taking this. The pt notes that she previously used a CPAP, but stopped using this because she was unable to tolerate this. She sleeps in a chair, and family members tell her that she is not snoring badly. She also has regular back pain for which she sees a pain management clinic.   The pt notes that her last two phlebotomies were not successful as the blood was not able to be collected properly.   She continues on Jardiance and a water pill.   The pt notes that she is anticipating several dental extractions with her dentist and also notes that her  abscesses have resolved after antibiotic treatment. She continued on Peridex. She denies pain along the jaw line.   Lab results today (07/07/18) of CBC w/diff and CMP is as follows: Cline values are WNL except for WBC at 20.5k, RBC at 6.07, MCV at 74.3, MCH at 20.3, MCHC at 27.3, RDW at 22.9, ANC at 17.7k, Glucose at 167, Albumin at 3.3, AST at 10, Alk Phos at 163. 07/07/18 LDH is pending   On review of systems, pt reports staying well hydrated,  recent fatigue, recent headaches, and denies pain along the jaw line, ankle swelling, abdominal pains, leg swelling, and any other symptoms.   MEDICAL HISTORY:  Past Medical History:  Diagnosis Date  . Agoraphobia   . Basal cell carcinoma   . Chronic pain   . Chronic respiratory failure with hypoxia ([name])    3L [place]  . DM type 2 (diabetes mellitus, type 2) ([place]) 10/16/2013  . Dysrhythmia    Hx: of palpitations "a long time ago"  . Elevated hemoglobin ([place]) 2014  . Fibromyalgia   . History of UTI   . HTN (hypertension)   . Hyperlipidemia   . Major depression   . Metastatic melanoma ([place]) 10/18/2012   12 mm posterior right upper lobe pulmonary nodule, max SUV 3.0    . Migraines   . Miscarriage    x 4  . Narcolepsy   . Panic attacks   . Peripheral neuropathy   . Peripheral neuropathy    in feet  . Polycythemia secondary to smoking   . Polycythemia vera(238.4)   . Scoliosis   . Shortness of breath    Hx: of with activity  . Sleep apnea   . Vertigo     SURGICAL HISTORY: Past Surgical History:  Procedure Laterality Date  . BASAL CELL CARCINOMA EXCISION     flap surgery on face  . BREAST LUMPECTOMY     bilateral  . COLONOSCOPY W/ BIOPSIES AND POLYPECTOMY     Hx: of  . CYSTECTOMY     abdominal wall   . DILATION AND CURETTAGE OF UTERUS     Hx: of   . VAGINAL HYSTERECTOMY    . VIDEO ASSISTED THORACOSCOPY (VATS)/WEDGE RESECTION Right 10/21/2012   Procedure: VIDEO ASSISTED THORACOSCOPY (VATS)/WEDGE RESECTION;  Surgeon: [name], MD;  Location: [facility];  Service: Thoracic;  Laterality: Right;  Marland Kitchen VIDEO BRONCHOSCOPY N/A 10/21/2012   Procedure: VIDEO BRONCHOSCOPY;  Surgeon: [name], MD;  Location: [facility] OR;  Service: Thoracic;  Laterality: N/A;    SOCIAL HISTORY: Social History   Socioeconomic History  . Marital status: Divorced    Spouse name: Not on file  . Number of children: Not on file  . Years of education: 12+  . Highest education level: Not on file    Occupational History  . Not on file  Social Needs  . Financial resource strain: Not on file  . Food insecurity:    Worry: Not on file    Inability: Not on file  . Transportation needs:    Medical: Not on file    Non-medical: Not on file  Tobacco Use  . Smoking status: Current Every Day Smoker    Packs/day: 1.00    Years: 10.00    Pack years: 10.00    Types: Cigarettes    Last attempt to quit: 09/16/2012    Years since quitting: 5.8  . Smokeless tobacco: Never Used  Substance and Sexual Activity  . Alcohol use: Yes  Comment: 1 -2 drinks a month, occasional  . Drug use: No  . Sexual activity: Not Currently  Lifestyle  . Physical activity:    Days per week: Not on file    Minutes per session: Not on file  . Stress: Not on file  Relationships  . Social connections:    Talks on phone: Not on file    Gets together: Not on file    Attends religious service: Not on file    Active member of club or organization: Not on file    Attends meetings of clubs or organizations: Not on file    Relationship status: Not on file  . Intimate partner violence:    Fear of current or ex partner: Not on file    Emotionally abused: Not on file    Physically abused: Not on file    Forced sexual activity: Not on file  Other Topics Concern  . Not on file  Social History Narrative  . Not on file    FAMILY HISTORY: Family History  Problem Relation Age of Onset  . Arthritis Mother   . Hyperlipidemia Mother   . Depression Mother   . Anxiety disorder Mother   . Dementia Mother   . Hypertension Father   . Hyperlipidemia Father   . Heart disease Father   . Stroke Father   . Dementia Father   . Heart disease Brother   . ADD / ADHD Son   . Alcohol abuse Maternal Grandfather   . Bipolar disorder Neg Hx   . Drug abuse Neg Hx   . OCD Neg Hx   . Paranoid behavior Neg Hx   . Schizophrenia Neg Hx   . Seizures Neg Hx   . Sexual abuse Neg Hx   . Physical abuse Neg Hx     ALLERGIES:  is  allergic to contrast media [iodinated diagnostic agents]; erythromycin; flagyl [metronidazole]; latex; other; penicillins; and tetracyclines & related.  MEDICATIONS:  Current Outpatient Medications  Medication Sig Dispense Refill  . amLODipine (NORVASC) 5 MG tablet TAKE 1 TABLET BY MOUTH EVERY DAY 90 tablet 0  . aspirin EC 81 MG tablet Take 81 mg by mouth daily.    Marland Kitchen atorvastatin (LIPITOR) 80 MG tablet Take 1 tablet (80 mg total) by mouth daily at 6 PM. 90 tablet 3  . chlorhexidine (PERIDEX) 0.12 % solution Use as directed 15 mLs in the mouth or throat 2 (two) times daily. Swish and spit. Do not swallow. 473 mL 0  . DULoxetine (CYMBALTA) 60 MG capsule TAKE 2 CAPSULES BY MOUTH EVERY DAY 60 capsule 2  . empagliflozin (JARDIANCE) 25 MG TABS tablet Take 25 mg by mouth daily. 90 tablet 4  . fenofibrate (TRICOR) 48 MG tablet TAKE 1 TABLET(48 MG) BY MOUTH DAILY 90 tablet 0  . fish oil-omega-3 fatty acids 1000 MG capsule Take 2 g by mouth 2 (two) times daily.    . fluconazole (DIFLUCAN) 150 MG tablet Take 1 tablet (150 mg total) by mouth every 3 (three) days as needed for up to 2 doses. 2 tablet 0  . gabapentin (NEURONTIN) 100 MG capsule TAKE 1 TO 3 CAPSULES(100 TO 300 MG) BY MOUTH THREE TIMES DAILY AS NEEDED FOR NERVE PAIN 90 capsule 2  . gabapentin (NEURONTIN) 100 MG capsule TAKE 1 TO 3 CAPSULES(100 TO 300 MG) BY MOUTH THREE TIMES DAILY AS NEEDED FOR NERVE PAIN 90 capsule 0  . Glucose Blood (BLOOD GLUCOSE TEST STRIPS) STRP Please dispense based on insurance and  patient preference. Use as directed to monitor FSBS 1x daily. Dx: E11.9. 100 each 3  . hydrochlorothiazide (HYDRODIURIL) 25 MG tablet Take 1 tablet (25 mg total) by mouth daily. 90 tablet 3  . hydroxyurea (HYDREA) 500 MG capsule Take 2 caps (1000 mg) on Monday, Wednesday and Friday and 1 cap (573m) the rest of the days. May take with food to minimize nausea. 60 capsule 2  . lisinopril (ZESTRIL) 2.5 MG tablet Take 1 tablet (2.5 mg total) by mouth  daily. 90 tablet 1  . metFORMIN (GLUCOPHAGE) 1000 MG tablet TAKE 1 TABLET BY MOUTH TWICE DAILY WITH A MEAL 180 tablet 0  . MICROLET LANCETS MISC Please dispense based on patient and insurance preference. Use as directed to monitor  FSBS 1x daily. Dx: E11.9. 100 each 11  . oxyCODONE-acetaminophen (PERCOCET) 7.5-325 MG tablet Take 1 tablet by mouth 3 (three) times daily as needed for severe pain. 90 tablet 0  . Pseudoeph-Doxylamine-DM-APAP (NYQUIL PO) Take 1-2 capsules by mouth at bedtime as needed (for cold/flu).    .Marland KitchentiZANidine (ZANAFLEX) 4 MG tablet TAKE 1 TABLET(4 MG) BY MOUTH TWICE DAILY AS NEEDED FOR MUSCLE SPASMS 60 tablet 2  . valACYclovir (VALTREX) 1000 MG tablet Take 1 tablet (1,000 mg total) by mouth 2 (two) times daily. 20 tablet 0   No current facility-administered medications for this visit.     REVIEW OF SYSTEMS:    A 10+ POINT REVIEW OF SYSTEMS WAS OBTAINED including neurology, dermatology, psychiatry, cardiac, respiratory, lymph, extremities, GI, GU, Musculoskeletal, constitutional, breasts, reproductive, HEENT.  Cline pertinent positives are noted in the HPI.  Cline others are negative.   PHYSICAL EXAMINATION: ECOG PERFORMANCE STATUS: 2 - Symptomatic, <50% confined to bed  Vitals:   07/07/18 0850  BP: (!) 130/58  Pulse: 86  Resp: 18  Temp: 98.3 F (36.8 C)  SpO2: 95%   Filed Weights   07/07/18 0850  Weight: 230 lb (104.3 kg)   .Body mass index is 34.97 kg/m.  GENERAL:alert, in no acute distress and comfortable SKIN: no acute rashes, no significant lesions EYES: conjunctiva are pink and non-injected, sclera anicteric OROPHARYNX: MMM, no exudates, no oropharyngeal erythema or ulceration NECK: supple, no JVD LYMPH:  no palpable lymphadenopathy in the cervical, axillary or inguinal regions LUNGS: clear to auscultation b/l with normal respiratory effort HEART: regular rate & rhythm ABDOMEN:  normoactive bowel sounds , non tender, not distended. Splenomegaly 3  fingerbreadths under costal margin.  Extremity: no pedal edema PSYCH: alert & oriented x 3 with fluent speech NEURO: no focal motor/sensory deficits   LABORATORY DATA:  I have reviewed the data as listed  . CBC Latest Ref Rng & Units 07/07/2018 05/19/2018 04/21/2018  WBC 4.0 - 10.5 K/uL 20.5(H) 25.2(H) 18.3(H)  Hemoglobin 12.0 - 15.0 g/dL 12.3 12.7 14.3  Hematocrit 36.0 - 46.0 % 45.1 45.5 51.8(H)  Platelets 150 - 400 K/uL 335 565(H) 307   . CBC    Component Value Date/Time   WBC 20.5 (H) 07/07/2018 0829   RBC 6.07 (H) 07/07/2018 0829   HGB 12.3 07/07/2018 0829   HGB 12.7 05/19/2018 0811   HCT 45.1 07/07/2018 0829   PLT 335 07/07/2018 0829   PLT 565 (H) 05/19/2018 0811   MCV 74.3 (L) 07/07/2018 0829   MCH 20.3 (L) 07/07/2018 0829   MCHC 27.3 (L) 07/07/2018 0829   RDW 22.9 (H) 07/07/2018 0829   LYMPHSABS 1.4 07/07/2018 0829   MONOABS 0.6 07/07/2018 0829   EOSABS 0.5 07/07/2018 06384  BASOSABS 0.1 07/07/2018 0829     . CMP Latest Ref Rng & Units 07/07/2018 05/19/2018 04/28/2018  Glucose 70 - 99 mg/dL 167(H) 152(H) 108(H)  BUN 6 - 20 mg/dL _0 Creatinine 0.44 - 1.00 mg/dL 0.88 0.76 0.70  Sodium 135 - 145 mmol/L 142 140 143  Potassium 3.5 - 5.1 mmol/L 4.9 4.5 5.1  Chloride 98 - 111 mmol/L 104 102 104  CO2 22 - 32 mmol/L _1 Calcium 8.9 - 10.3 mg/dL 9.8 9.8 9.8  Total Protein 6.5 - 8.1 g/dL 7.3 7.5 -  Total Bilirubin 0.3 - 1.2 mg/dL 0.4 0.3 -  Alkaline Phos 38 - 126 U/L 163(H) 142(H) -  AST 15 - 41 U/L 10(L) 10(L) -  ALT 0 - 44 U/L 10 10 -   08/15/12 JAK2 Mutation:     03/15/18 BM Bx:      RADIOGRAPHIC STUDIES: I have personally reviewed the radiological images as listed and agreed with the findings in the report. No results found.  ASSESSMENT & PLAN:   58 y.o. female with  1. Jak2 V617F mutation positive Myeloproliferative syndrome.- BM Bx consistent with Polycythemia Vera  JAK2 mutation identified in 08/15/12 labs   03/15/18 BM Bx revealed  results consistent with JAK2 positive polycythemia vera   Current hgb.hct are at 14/47.6 Thrombocytosis PLT 725k  2. Increasing leucocytosis - primarily neutrophilia. - related to Polycythemia vera Patient with recent dental infections/abscesses and respiratory infection. Part of Leukocytosis could be due to his MPN  - PCV vs Myelofibrosis.  Neutrophilia began in March 2015 at 9.6k, slowly increased to 14k in August 2018, 19k in March 2019, now to [place] in September 2019.  3. Polycythemia vera Confirmed by 08/15/12 JAK 2 mutation test  4. History of Metastatic melanoma to the right lung  S/p resection by Dr. Servando Snare on 10/21/12, BRAF mutation not detected. -continue q7monthy skin screening her PCP/dermatologist -symptom directed radiologic scanning.   PLAN: -Discussed pt labwork today, 07/07/18; WBC high but myelofibrosis has been ruled out with previous BM Bx. PLT normalized to 335k. HGB at 12.3 and HCT at 45.1% indicating polycythemia vera is stable  -PLT now at goal, which is to be between 200k and 400k -HCT essentially at goal, which is to have HCT <45% -Will now move to maintenance phlebotomies, trend has been stable over the last month without successful phlebotomies  -The pt has no prohibitive toxicities from continuing 10021mHydroxyurea 3 days a week, and 50072m days a week, at this time.   -Will watch for signs of chronic inflammation -Recommended that the pt continue to eat well, drink at least 48-64 oz of water each day, and walk 20-30 minutes each day.   -Recommended that the pt continue to abide by sun protection strategies as Hydroxyurea can sensitize the skin  -Continue ASA 62m31m daily  -Counseled the pt towards complete smoking cessation and explained that this would serve as a cause for increased polycythemia and strokes -Recommend Peridex mouthwash -Encouraged her to maintenance compliance with her medications -Will see the pt back in 2 months   RTC with Dr KaleIrene Limbo 2 months with labs   Cline of the patients questions were answered with apparent satisfaction. The patient knows to call the clinic with any problems, questions or concerns.  The total time spent in the appt was 30 minutes and more than 50% was on counseling and direct patient cares.    GautSullivan LoneMS AAnnabellaIVMS SCHChi St [facility]  [facility] Hematology/Oncology Physician [facility]  (Office):       [phone] (Work cell):  [phone] (Fax):           [phone]  07/07/2018 9:57 AM  I, Baldwin Jamaica, am acting as a scribe for Dr. Sullivan Lone.   .I have reviewed the above documentation for accuracy and completeness, and I agree with the above. [name] MD

## 2018-07-07 ENCOUNTER — Telehealth: Payer: Self-pay | Admitting: Hematology

## 2018-07-07 ENCOUNTER — Inpatient Hospital Stay: Payer: Medicare HMO | Attending: Hematology | Admitting: Hematology

## 2018-07-07 ENCOUNTER — Inpatient Hospital Stay: Payer: Medicare HMO

## 2018-07-07 ENCOUNTER — Encounter: Payer: Self-pay | Admitting: Hematology

## 2018-07-07 VITALS — BP 130/58 | HR 86 | Temp 98.3°F | Resp 18 | Ht 68.0 in | Wt 230.0 lb

## 2018-07-07 DIAGNOSIS — M549 Dorsalgia, unspecified: Secondary | ICD-10-CM | POA: Diagnosis not present

## 2018-07-07 DIAGNOSIS — Z7982 Long term (current) use of aspirin: Secondary | ICD-10-CM | POA: Insufficient documentation

## 2018-07-07 DIAGNOSIS — M797 Fibromyalgia: Secondary | ICD-10-CM | POA: Insufficient documentation

## 2018-07-07 DIAGNOSIS — D45 Polycythemia vera: Secondary | ICD-10-CM | POA: Diagnosis not present

## 2018-07-07 DIAGNOSIS — I10 Essential (primary) hypertension: Secondary | ICD-10-CM | POA: Insufficient documentation

## 2018-07-07 DIAGNOSIS — Z79899 Other long term (current) drug therapy: Secondary | ICD-10-CM | POA: Diagnosis not present

## 2018-07-07 DIAGNOSIS — G4719 Other hypersomnia: Secondary | ICD-10-CM

## 2018-07-07 DIAGNOSIS — Z7984 Long term (current) use of oral hypoglycemic drugs: Secondary | ICD-10-CM | POA: Insufficient documentation

## 2018-07-07 DIAGNOSIS — G47419 Narcolepsy without cataplexy: Secondary | ICD-10-CM | POA: Diagnosis not present

## 2018-07-07 DIAGNOSIS — D75839 Thrombocytosis, unspecified: Secondary | ICD-10-CM

## 2018-07-07 DIAGNOSIS — D471 Chronic myeloproliferative disease: Secondary | ICD-10-CM | POA: Diagnosis not present

## 2018-07-07 DIAGNOSIS — Z85820 Personal history of malignant melanoma of skin: Secondary | ICD-10-CM | POA: Insufficient documentation

## 2018-07-07 DIAGNOSIS — R51 Headache: Secondary | ICD-10-CM | POA: Diagnosis not present

## 2018-07-07 DIAGNOSIS — G4730 Sleep apnea, unspecified: Secondary | ICD-10-CM | POA: Diagnosis not present

## 2018-07-07 DIAGNOSIS — E785 Hyperlipidemia, unspecified: Secondary | ICD-10-CM | POA: Insufficient documentation

## 2018-07-07 DIAGNOSIS — Z85828 Personal history of other malignant neoplasm of skin: Secondary | ICD-10-CM | POA: Diagnosis not present

## 2018-07-07 DIAGNOSIS — D751 Secondary polycythemia: Secondary | ICD-10-CM | POA: Diagnosis not present

## 2018-07-07 DIAGNOSIS — J449 Chronic obstructive pulmonary disease, unspecified: Secondary | ICD-10-CM | POA: Diagnosis not present

## 2018-07-07 DIAGNOSIS — D72829 Elevated white blood cell count, unspecified: Secondary | ICD-10-CM

## 2018-07-07 DIAGNOSIS — D473 Essential (hemorrhagic) thrombocythemia: Secondary | ICD-10-CM

## 2018-07-07 DIAGNOSIS — R5383 Other fatigue: Secondary | ICD-10-CM | POA: Insufficient documentation

## 2018-07-07 LAB — CMP (CANCER CENTER ONLY)
ALBUMIN: 3.3 g/dL — AB (ref 3.5–5.0)
ALK PHOS: 163 U/L — AB (ref 38–126)
ALT: 10 U/L (ref 0–44)
ANION GAP: 9 (ref 5–15)
AST: 10 U/L — ABNORMAL LOW (ref 15–41)
BUN: 15 mg/dL (ref 6–20)
CO2: 29 mmol/L (ref 22–32)
Calcium: 9.8 mg/dL (ref 8.9–10.3)
Chloride: 104 mmol/L (ref 98–111)
Creatinine: 0.88 mg/dL (ref 0.44–1.00)
GFR, Est AFR Am: >60 mL/min (ref >60)
GFR, Estimated: >60 mL/min (ref >60)
GLUCOSE: 167 mg/dL — AB (ref 70–99)
Potassium: 4.9 mmol/L (ref 3.5–5.1)
Sodium: 142 mmol/L (ref 135–145)
TOTAL PROTEIN: 7.3 g/dL (ref 6.5–8.1)
Total Bilirubin: 0.4 mg/dL (ref 0.3–1.2)

## 2018-07-07 LAB — CBC WITH DIFFERENTIAL/PLATELET
ABS IMMATURE GRANULOCYTES: 0.19 10*3/uL — AB (ref 0.00–0.07)
Basophils Absolute: 0.1 10*3/uL (ref 0.0–0.1)
Basophils Relative: 1 %
Eosinophils Absolute: 0.5 10*3/uL (ref 0.0–0.5)
Eosinophils Relative: 3 %
HCT: 45.1 % (ref 36.0–46.0)
Hemoglobin: 12.3 g/dL (ref 12.0–15.0)
Immature Granulocytes: 1 %
Lymphocytes Relative: 7 %
Lymphs Abs: 1.4 10*3/uL (ref 0.7–4.0)
MCH: 20.3 pg — ABNORMAL LOW (ref 26.0–34.0)
MCHC: 27.3 g/dL — ABNORMAL LOW (ref 30.0–36.0)
MCV: 74.3 fL — ABNORMAL LOW (ref 80.0–100.0)
MONOS PCT: 3 %
Monocytes Absolute: 0.6 10*3/uL (ref 0.1–1.0)
Neutro Abs: 17.7 10*3/uL — ABNORMAL HIGH (ref 1.7–7.7)
Neutrophils Relative %: 85 %
Platelets: 335 10*3/uL (ref 150–400)
RBC: 6.07 MIL/uL — ABNORMAL HIGH (ref 3.87–5.11)
RDW: 22.9 % — ABNORMAL HIGH (ref 11.5–15.5)
WBC: 20.5 10*3/uL — ABNORMAL HIGH (ref 4.0–10.5)
nRBC: 0.1 % (ref 0.0–0.2)

## 2018-07-07 LAB — LACTATE DEHYDROGENASE: LDH: 217 U/L — ABNORMAL HIGH (ref 98–192)

## 2018-07-07 NOTE — Telephone Encounter (Signed)
Printed calendar and avs. °

## 2018-07-07 NOTE — Patient Instructions (Signed)
Thank you for choosing [facility] to provide your oncology and hematology care.  To afford each patient quality time with our providers, please arrive 30 minutes before your scheduled appointment time.  If you arrive late for your appointment, you may be asked to reschedule.  We strive to give you quality time with our providers, and arriving late affects you and other patients whose appointments are after yours.    If you are a no show for multiple scheduled visits, you may be dismissed from the clinic at the providers discretion.     Again, thank you for choosing [facility], our hope is that these requests will decrease the amount of time that you wait before being seen by our physicians.  ______________________________________________________________________   Should you have questions after your visit to the [facility], please contact our office at [phone] between the hours of 8:30 and 4:30 p.m.    Voicemails left after 4:30p.m will not be returned until the following business day.     For prescription refill requests, please have your pharmacy contact us directly.  Please also try to allow 48 hours for prescription requests.     Please contact the scheduling department for questions regarding scheduling.  For scheduling of procedures such as PET scans, CT scans, MRI, Ultrasound, etc please contact central scheduling at [phone].     Resources For Cancer Patients and Caregivers:    Oncolink.org:  A wonderful resource for patients and healthcare providers for information regarding your disease, ways to tract your treatment, what to expect, etc.      American Cancer Society:  800-227-2345  Can help patients locate various types of support and financial assistance   Cancer Care: 1-800-813-HOPE (4673) Provides financial assistance, online support groups, medication/co-pay assistance.     [place] DSS:  336-641-3447 Where to apply  for food stamps, Medicaid, and utility assistance   Medicare Rights Center: 800-333-4114 Helps people with Medicare understand their rights and benefits, navigate the Medicare system, and secure the quality healthcare they deserve   SCAT: 336-333-6589 [place] [employer]'s shared-ride transportation service for eligible riders who have a disability that prevents them from riding the fixed route bus.     For additional information on assistance programs please contact our social worker:   Abigail Elmore:  [phone]  

## 2018-07-12 ENCOUNTER — Ambulatory Visit: Payer: Medicare HMO | Admitting: Family Medicine

## 2018-07-12 DIAGNOSIS — M5030 Other cervical disc degeneration, unspecified cervical region: Secondary | ICD-10-CM | POA: Diagnosis not present

## 2018-07-12 DIAGNOSIS — M5136 Other intervertebral disc degeneration, lumbar region: Secondary | ICD-10-CM | POA: Diagnosis not present

## 2018-07-12 DIAGNOSIS — G894 Chronic pain syndrome: Secondary | ICD-10-CM | POA: Diagnosis not present

## 2018-07-12 DIAGNOSIS — Z79899 Other long term (current) drug therapy: Secondary | ICD-10-CM | POA: Diagnosis not present

## 2018-07-16 ENCOUNTER — Other Ambulatory Visit: Payer: Self-pay | Admitting: Family Medicine

## 2018-07-18 ENCOUNTER — Ambulatory Visit (INDEPENDENT_AMBULATORY_CARE_PROVIDER_SITE_OTHER): Payer: Medicare HMO | Admitting: Family Medicine

## 2018-07-18 ENCOUNTER — Other Ambulatory Visit: Payer: Self-pay

## 2018-07-18 ENCOUNTER — Encounter: Payer: Self-pay | Admitting: Family Medicine

## 2018-07-18 VITALS — BP 132/64 | HR 70 | Temp 98.1°F | Resp 14 | Ht 68.0 in | Wt 222.0 lb

## 2018-07-18 DIAGNOSIS — Z23 Encounter for immunization: Secondary | ICD-10-CM | POA: Diagnosis not present

## 2018-07-18 DIAGNOSIS — E6609 Other obesity due to excess calories: Secondary | ICD-10-CM

## 2018-07-18 DIAGNOSIS — F17200 Nicotine dependence, unspecified, uncomplicated: Secondary | ICD-10-CM

## 2018-07-18 DIAGNOSIS — E119 Type 2 diabetes mellitus without complications: Secondary | ICD-10-CM

## 2018-07-18 DIAGNOSIS — G6289 Other specified polyneuropathies: Secondary | ICD-10-CM

## 2018-07-18 DIAGNOSIS — I700 Atherosclerosis of aorta: Secondary | ICD-10-CM

## 2018-07-18 DIAGNOSIS — G4730 Sleep apnea, unspecified: Secondary | ICD-10-CM

## 2018-07-18 DIAGNOSIS — I10 Essential (primary) hypertension: Secondary | ICD-10-CM | POA: Diagnosis not present

## 2018-07-18 DIAGNOSIS — G894 Chronic pain syndrome: Secondary | ICD-10-CM | POA: Diagnosis not present

## 2018-07-18 DIAGNOSIS — D45 Polycythemia vera: Secondary | ICD-10-CM

## 2018-07-18 DIAGNOSIS — Z6833 Body mass index (BMI) 33.0-33.9, adult: Secondary | ICD-10-CM

## 2018-07-18 MED ORDER — GABAPENTIN 100 MG PO CAPS: ORAL_CAPSULE | ORAL | 1 refills | Status: DC

## 2018-07-18 MED ORDER — AMLODIPINE BESYLATE 5 MG PO TABS: 5.0000 mg | ORAL_TABLET | Freq: Every day | ORAL | 2 refills | Status: DC

## 2018-07-18 MED ORDER — LISINOPRIL 2.5 MG PO TABS: 2.5000 mg | ORAL_TABLET | Freq: Every day | ORAL | 2 refills | Status: DC

## 2018-07-18 MED ORDER — HYDROCHLOROTHIAZIDE 25 MG PO TABS: 25.0000 mg | ORAL_TABLET | Freq: Every day | ORAL | 3 refills | Status: DC

## 2018-07-18 MED ORDER — VARENICLINE TARTRATE 0.5 MG X 11 & 1 MG X 42 PO MISC: ORAL | 0 refills | Status: DC

## 2018-07-18 NOTE — Assessment & Plan Note (Signed)
Will see if chantix now covered by insurance

## 2018-07-18 NOTE — Assessment & Plan Note (Signed)
Continue Lipitor and TriCor.  Continue to work on dietary changes would benefit from 20 pound weight loss

## 2018-07-18 NOTE — Assessment & Plan Note (Signed)
Continue with [facility] for her chronic pain

## 2018-07-18 NOTE — Assessment & Plan Note (Signed)
Diabetes has been fairly well controlled we will recheck her A1c.  She does have all of her medications currently.

## 2018-07-18 NOTE — Assessment & Plan Note (Signed)
Blood pressure looks okay no change to her medications.  She is on the hydrochlorothiazide Norvasc and lisinopril

## 2018-07-18 NOTE — Patient Instructions (Addendum)
Chantix sent to pharmacy Flu shot given We will call with lab results Sleep study to be done F/U 4 months  [phone] fax

## 2018-07-18 NOTE — Assessment & Plan Note (Signed)
Reviewed her last oncology note her cells are borderline for myeloproliferative disorder

## 2018-07-18 NOTE — Progress Notes (Signed)
Subjective:    Patient ID: [name], female    DOB: 08-10-1960, 58 y.o.   MRN: [id number]  Patient presents for Follow-up (is fasting)   Pt here to f/u chronic    Chronic pain- followed by [facility], on baclofen twice a day and percocet 7.5 four times a day     Polycythemia Vera vs Myelofibrosis- she does not have leukemia, she is getting  Phelebotmy and on hydroxyruea.   WBC down to 20,000   She had acute kidney damage back in Sept, now back to baseline Cr. 0.88    She continues to have daytime fatigue, she was on provigil in the past, but also probable sleep apnea untreated   Hyperlipidemia- lipitor and tricor    HTN- taking lisinopril , norvasc, HCTZ 25mg , Dyzaide discontinued due to hyperkalemia    Diabetes mellitus- taking jardiance 25mg , Metformin 1000mg  BID CBG range 110-130, Last 6.4%, CBG did go uo when she was sick a few weeks ago and when she was out of jardiance    Tobacco use- wants to try Chantix   Due for flu shot   History of melanoma states that her pain clinic has a dermatologist that she can see  Review Of Systems:  GEN- denies fatigue, fever, weight loss,weakness, recent illness HEENT- denies eye drainage, change in vision, nasal discharge, CVS- denies chest pain, palpitations RESP- denies SOB, cough, wheeze ABD- denies N/V, change in stools, abd pain GU- denies dysuria, hematuria, dribbling, incontinence MSK-+ joint pain, muscle aches, injury Neuro- denies headache, dizziness, syncope, seizure activity       Objective:    BP 132/64   Pulse 70   Temp 98.1 F (36.7 C) (Oral)   Resp 14   Ht 5\' 8"  (1.727 m)   Wt 222 lb (100.7 kg)   SpO2 97%   BMI 33.75 kg/m  GEN- NAD, alert and oriented x3,obese  HEENT- PERRL, EOMI, non injected sclera, pink conjunctiva, MMM, oropharynx clear Neck- Supple, no thyromegaly CVS- RRR, no murmur RESP-CTAB ABD-NABS,soft,NT,ND EXT- No edema,very dry skin Pulses- Radial, DP- 2+         Assessment & Plan:      Problem List Items Addressed This Visit      Unprioritized   Aortic atherosclerosis ([place])    Continue Lipitor and TriCor.  Continue to work on dietary changes would benefit from 20 pound weight loss      Relevant Medications   lisinopril (ZESTRIL) 2.5 MG tablet   amLODipine (NORVASC) 5 MG tablet   hydrochlorothiazide (HYDRODIURIL) 25 MG tablet   Other Relevant Orders   Lipid panel   Chronic pain disorder    Continue with [facility] for her chronic pain      Relevant Medications   oxyCODONE-acetaminophen (PERCOCET) 7.5-325 MG tablet   Diabetes mellitus type II, controlled ([place])    Diabetes has been fairly well controlled we will recheck her A1c.  She does have all of her medications currently.      Relevant Medications   lisinopril (ZESTRIL) 2.5 MG tablet   Other Relevant Orders   Hemoglobin A1c   Lipid panel   Essential hypertension, benign - Primary    Blood pressure looks okay no change to her medications.  She is on the hydrochlorothiazide Norvasc and lisinopril      Relevant Medications   lisinopril (ZESTRIL) 2.5 MG tablet   amLODipine (NORVASC) 5 MG tablet   hydrochlorothiazide (HYDRODIURIL) 25 MG tablet   Obesity   Peripheral  neuropathy   Relevant Medications   baclofen (LIORESAL) 10 MG tablet   gabapentin (NEURONTIN) 100 MG capsule   varenicline (CHANTIX STARTING MONTH PAK) 0.5 MG X 11 & 1 MG X 42 tablet   Polycythemia vera ([place])    Reviewed her last oncology note her cells are borderline for myeloproliferative disorder      Tobacco use disorder    Will see if chantix now covered by insurance       Other Visit Diagnoses    Need for immunization against influenza       Relevant Orders   Flu Vaccine QUAD 36+ mos IM (Completed)   Sleep apnea, unspecified type       Obtain sleep study before starting Provigil as she may need CPAP therapy instead of this medication.   Relevant Orders   Ambulatory referral to Sleep  Studies      Note: This dictation was prepared with Dragon dictation along with smaller phrase technology. Any transcriptional errors that result from this process are unintentional.

## 2018-07-19 LAB — LIPID PANEL
Cholesterol: 130 mg/dL (ref <200)
HDL: 29 mg/dL — ABNORMAL LOW (ref >50)
LDL Cholesterol (Calc): 74 mg/dL (calc)
Non-HDL Cholesterol (Calc): 101 mg/dL (calc) (ref <130)
Total CHOL/HDL Ratio: 4.5 (calc) (ref <5.0)
Triglycerides: 178 mg/dL — ABNORMAL HIGH (ref <150)

## 2018-07-19 LAB — HEMOGLOBIN A1C
EAG (MMOL/L): 7.3 (calc)
Hgb A1c MFr Bld: 6.2 % of total Hgb — ABNORMAL HIGH (ref <5.7)
MEAN PLASMA GLUCOSE: 131 (calc)

## 2018-07-21 ENCOUNTER — Encounter: Payer: Self-pay | Admitting: *Deleted

## 2018-07-22 ENCOUNTER — Other Ambulatory Visit: Payer: Self-pay | Admitting: Family Medicine

## 2018-07-22 ENCOUNTER — Telehealth: Payer: Self-pay | Admitting: *Deleted

## 2018-07-22 MED ORDER — IBUPROFEN 600 MG PO TABS: 600.0000 mg | ORAL_TABLET | Freq: Two times a day (BID) | ORAL | 0 refills | Status: AC | PRN

## 2018-07-22 NOTE — Telephone Encounter (Signed)
Received call from patient.   States that she has x2 days of unilateral L foot edema with redness and pain to touch. Reports that redness has improved some, and is limited to the inner arch of L foot. Edema is limited to top of L foot and does not reach ankle. States that skin is warm and dry, and there is no noticeable difference in other skin temperatures.   Reports that she did have an increase in standing on Wed while she was completing the laundry, and had noticed the difference on Thursday.   Reports that FSBS are stable at 120 & 124 for the past 2 days.   Requested MD to advise.

## 2018-07-22 NOTE — Telephone Encounter (Signed)
Call placed to patient and patient made aware.   Appointment scheduled.  

## 2018-07-22 NOTE — Telephone Encounter (Signed)
She can try taking ibuprofen 600mg  twice a day, elevate foot  If redness spreading or pain, she needs to go to ER, foot needs to be seen

## 2018-07-26 ENCOUNTER — Ambulatory Visit: Payer: Self-pay | Admitting: Family Medicine

## 2018-07-29 ENCOUNTER — Telehealth: Payer: Self-pay | Admitting: *Deleted

## 2018-07-29 MED ORDER — CEFDINIR 300 MG PO CAPS: 300.0000 mg | ORAL_CAPSULE | Freq: Two times a day (BID) | ORAL | 0 refills | Status: DC

## 2018-07-29 MED ORDER — CEFDINIR 300 MG PO CAPS: 300.0000 mg | ORAL_CAPSULE | Freq: Two times a day (BID) | ORAL | 0 refills | Status: AC

## 2018-07-29 NOTE — Telephone Encounter (Signed)
omnicef 300mg  BID x 7 days, Needs appt if not improved Continue nasal saline Add claritin, zyrtec or benadryl

## 2018-07-29 NOTE — Telephone Encounter (Signed)
Prescription sent to pharmacy. .   Call placed to patient and patient made aware.  

## 2018-07-29 NOTE — Addendum Note (Signed)
Addended by: Sheral Flow on: 07/29/2018 02:16 PM   Modules accepted: Orders

## 2018-07-29 NOTE — Telephone Encounter (Signed)
Received call from patient.   States that she is having sinus pressure, nasal congestion/ drainage, and low grade fever. Reports that she has tried nasal saline and afrin with no relief.   Requested ABTx. States that she is having throbbing pain in fractured tooth, and does not want it to get too bad.   MD please advise.

## 2018-07-30 ENCOUNTER — Other Ambulatory Visit: Payer: Self-pay | Admitting: Family Medicine

## 2018-07-30 DIAGNOSIS — J9611 Chronic respiratory failure with hypoxia: Secondary | ICD-10-CM | POA: Diagnosis not present

## 2018-07-30 DIAGNOSIS — C3490 Malignant neoplasm of unspecified part of unspecified bronchus or lung: Secondary | ICD-10-CM | POA: Diagnosis not present

## 2018-07-30 MED ORDER — CLINDAMYCIN HCL 300 MG PO CAPS: 300.0000 mg | ORAL_CAPSULE | Freq: Three times a day (TID) | ORAL | 0 refills | Status: AC

## 2018-07-30 NOTE — Progress Notes (Signed)
Pt paged oncall provider She was being treated for sinus infection with cefdinir, woke up this morning and had facial swelling to her left cheek spreading down to her jaw.  She feels the lymph nodes under her left jaw are enlarged.  She can speak, swallow and open and close her mouth but does complain that the skin swelling is making it a little tight.    She says she can move her tongue ok No redness or swelling going down her neck  Will add clindamycin and reviewed ER precautions.  The pain in her teeth was getting better.  So I think w/o sx concerning for ludwigs angina she can try clinda.

## 2018-08-10 DIAGNOSIS — Z79899 Other long term (current) drug therapy: Secondary | ICD-10-CM | POA: Diagnosis not present

## 2018-08-10 DIAGNOSIS — G894 Chronic pain syndrome: Secondary | ICD-10-CM | POA: Diagnosis not present

## 2018-08-10 DIAGNOSIS — F1120 Opioid dependence, uncomplicated: Secondary | ICD-10-CM | POA: Diagnosis not present

## 2018-08-10 DIAGNOSIS — M5030 Other cervical disc degeneration, unspecified cervical region: Secondary | ICD-10-CM | POA: Diagnosis not present

## 2018-08-25 ENCOUNTER — Telehealth: Payer: Self-pay | Admitting: Family Medicine

## 2018-08-25 NOTE — Telephone Encounter (Signed)
Patient called in with c/o dental abscess. Patient stated that she gets these frequently and she is currently waiting for approval from insurance for abstractions of teeth. Patient denies fever, or severe headache, or SOB. Patient scheduled for an office visit tomorrow with Laurell Roof

## 2018-08-26 ENCOUNTER — Ambulatory Visit (INDEPENDENT_AMBULATORY_CARE_PROVIDER_SITE_OTHER): Payer: Medicare HMO | Admitting: Family Medicine

## 2018-08-26 ENCOUNTER — Encounter: Payer: Self-pay | Admitting: Family Medicine

## 2018-08-26 VITALS — BP 122/76 | HR 82 | Temp 97.8°F | Resp 16 | Ht 68.0 in | Wt 220.0 lb

## 2018-08-26 DIAGNOSIS — K047 Periapical abscess without sinus: Secondary | ICD-10-CM | POA: Diagnosis not present

## 2018-08-26 DIAGNOSIS — L03211 Cellulitis of face: Secondary | ICD-10-CM | POA: Diagnosis not present

## 2018-08-26 MED ORDER — CLINDAMYCIN HCL 300 MG PO CAPS: 300.0000 mg | ORAL_CAPSULE | Freq: Three times a day (TID) | ORAL | 0 refills | Status: AC

## 2018-08-26 MED ORDER — FLUCONAZOLE 150 MG PO TABS: 150.0000 mg | ORAL_TABLET | ORAL | 0 refills | Status: DC | PRN

## 2018-08-26 NOTE — Patient Instructions (Signed)
Take antibiotics for 7 days, stop if you can, but if you have some remaining swelling or signs of cellulitis, can continue until 10 days.  Clindamycin can cause a GI infection called c.diff and it can be bothersome to life-threatening - so follow up if you develop severe diarrhea.  Follow up ASAP or go to the ER with signs of worsening facial or dental swelling, redness, pain.  Call 911 or go to the ER if you have swelling of throat, cannot open or clothes mouth/move tongue/swallow, speak.

## 2018-08-26 NOTE — Progress Notes (Signed)
Patient ID: Katelyn Cline, female    DOB: 1960/10/12, 58 y.o.   MRN: [id number]  PCP: [name], MD  Chief Complaint  Patient presents with  . Dental Problem    Patient in today with c/o left sided dental abscess.     Subjective:   Katelyn Cline is a 58 y.o. female, presents to clinic with CC of worsening of chronic dental infection with swelling and pain to left cheek and left jaw.  She had similar almost 4-5 weeks ago, it did improve with abx, and she does require diflucan, but swelling and pain reoccurred starting a few days ago.  It is tender to the touch, visibly swollen.  She denies any redness, trismus, difficulty speaking, breathing or swallowing.  She denies any fever, chills, sweats, nausea, vomiting. She has been working for months on getting approval for insurance to pay for multiple dental extractions due to recurrent infections and other comorbidities, clearly multiple hospitalizations and she is immunocompromise secondary to leukemia.   Patient Active Problem List   Diagnosis Date Noted  . MPN (myeloproliferative neoplasm) ([place])   . Leukocytosis 03/12/2018  . Symptoms of upper respiratory infection (URI) 03/12/2018  . Dental caries 03/12/2018  . Aortic atherosclerosis ([place]) 02/21/2018  . Constipation 05/28/2015  . OE (otitis externa) 01/04/2015  . Seborrheic keratoses 08/24/2014  . PVC (premature ventricular contraction) 05/18/2014  . Gastroenteritis 02/15/2014  . Diabetes mellitus type II, controlled ([place]) 02/06/2013  . Chronic hypoxemic respiratory failure ([place]) 02/06/2013  . History of basal cell carcinoma 12/01/2012  . Posttraumatic stress disorder 12/01/2012  . History of malignant melanoma 10/18/2012  . Polycythemia vera ([place]) 08/05/2012  . Hyperlipidemia 04/09/2012  . Narcolepsy 04/09/2012  . Peripheral neuropathy 04/09/2012  . Tobacco use disorder 04/09/2012  . Obesity 04/09/2012  . Essential hypertension, benign 04/09/2012  . DDD  (degenerative disc disease), lumbar 02/16/2012  . DDD (degenerative disc disease), cervical 02/16/2012  . Chronic pain disorder 02/16/2012  . Depression 02/16/2012  . Fibromyalgia 02/16/2012     Prior to Admission medications   Medication Sig Start Date End Date Taking? Authorizing Provider  amLODipine (NORVASC) 5 MG tablet Take 1 tablet (5 mg total) by mouth daily. 07/18/18  Yes [place], Modena Nunnery, MD  aspirin EC 81 MG tablet Take 81 mg by mouth daily.   Yes [name], MD  atorvastatin (LIPITOR) 80 MG tablet Take 1 tablet (80 mg total) by mouth daily at 6 PM. 10/19/17  Yes Rives, Modena Nunnery, MD  baclofen (LIORESAL) 10 MG tablet Take 10 mg by mouth 2 (two) times daily as needed for muscle spasms.   Yes [provider]  chlorhexidine (PERIDEX) 0.12 % solution Use as directed 15 mLs in the mouth or throat 2 (two) times daily. Swish and spit. Do not swallow. 05/19/18  Yes [name], MD  DULoxetine (CYMBALTA) 60 MG capsule TAKE 2 CAPSULES BY MOUTH EVERY DAY 07/22/18  Yes [place], Modena Nunnery, MD  empagliflozin (JARDIANCE) 25 MG TABS tablet Take 25 mg by mouth daily. 04/07/18  Yes Susy Frizzle, MD  fenofibrate (TRICOR) 48 MG tablet TAKE 1 TABLET(48 MG) BY MOUTH DAILY 04/21/18  Yes [place], Modena Nunnery, MD  fish oil-omega-3 fatty acids 1000 MG capsule Take 2 g by mouth 2 (two) times daily.   Yes [provider]  fluconazole (DIFLUCAN) 150 MG tablet Take 1 tablet (150 mg total) by mouth every 3 (three) days as needed for up to 2 doses. 04/28/18  Yes Delsa Grana, PA-C  gabapentin (NEURONTIN) 100 MG capsule TAKE 1 TO 3 CAPSULES(100 TO 300 MG) BY MOUTH THREE TIMES DAILY AS NEEDED FOR NERVE PAIN 07/18/18  Yes [place], Modena Nunnery, MD  Glucose Blood (BLOOD GLUCOSE TEST STRIPS) STRP Please dispense based on insurance and patient preference. Use as directed to monitor FSBS 1x daily. Dx: E11.9. 10/12/17  Yes [place], Modena Nunnery, MD  hydrochlorothiazide (HYDRODIURIL) 25 MG tablet  Take 1 tablet (25 mg total) by mouth daily. 07/18/18  Yes [place], Modena Nunnery, MD  hydroxyurea (HYDREA) 500 MG capsule Take 2 caps (1000 mg) on Monday, Wednesday and Friday and 1 cap (500mg ) the rest of the days. May take with food to minimize nausea. 05/19/18  Yes [name], MD  lisinopril (ZESTRIL) 2.5 MG tablet Take 1 tablet (2.5 mg total) by mouth daily. 07/18/18  Yes [place], Modena Nunnery, MD  metFORMIN (GLUCOPHAGE) 1000 MG tablet TAKE 1 TABLET BY MOUTH TWICE DAILY WITH A MEAL 07/22/18  Yes [place], Modena Nunnery, MD  MICROLET LANCETS MISC Please dispense based on patient and insurance preference. Use as directed to monitor  FSBS 1x daily. Dx: E11.9. 10/12/17  Yes [place], Modena Nunnery, MD  oxyCODONE-acetaminophen (PERCOCET) 7.5-325 MG tablet Take 1 tablet by mouth 4 (four) times daily as needed for severe pain. 07/18/18  Yes [place], Modena Nunnery, MD  Pseudoeph-Doxylamine-DM-APAP (NYQUIL PO) Take 1-2 capsules by mouth at bedtime as needed (for cold/flu).   Yes [provider]  valACYclovir (VALTREX) 1000 MG tablet Take 1 tablet (1,000 mg total) by mouth 2 (two) times daily. 04/12/18  Yes Orlena Sheldon, PA-C  varenicline (CHANTIX STARTING MONTH PAK) 0.5 MG X 11 & 1 MG X 42 tablet Take one 0.5 mg tablet by mouth once daily for 3 days, then increase to one 0.5 mg tablet twice daily for 4 days, then increase to one 1 mg tablet twice daily. 07/18/18  Yes [place], Modena Nunnery, MD     Allergies  Allergen Reactions  . Contrast Media [Iodinated Diagnostic Agents] Anaphylaxis  . Erythromycin   . Flagyl [Metronidazole] Other (See Comments)    Generalized pain.  . Latex Other (See Comments)    Was told to be careful because of Dye allergy  . Other     Iodine Dye  . Penicillins Other (See Comments)    Patient was an infant, no idea of reaction. Tolerates Keflex.  . Tetracyclines & Related Other (See Comments)    GI side effects     Family History  Problem Relation Age of Onset  . Arthritis Mother     . Hyperlipidemia Mother   . Depression Mother   . Anxiety disorder Mother   . Dementia Mother   . Hypertension Father   . Hyperlipidemia Father   . Heart disease Father   . Stroke Father   . Dementia Father   . Heart disease Brother   . ADD / ADHD Son   . Alcohol abuse Maternal Grandfather   . Bipolar disorder Neg Hx   . Drug abuse Neg Hx   . OCD Neg Hx   . Paranoid behavior Neg Hx   . Schizophrenia Neg Hx   . Seizures Neg Hx   . Sexual abuse Neg Hx   . Physical abuse Neg Hx      Social History   Socioeconomic History  . Marital status: Divorced    Spouse name: Not on file  . Number of children: Not on file  . Years of  education: 12+  . Highest education level: Not on file  Occupational History  . Not on file  Social Needs  . Financial resource strain: Not on file  . Food insecurity:    Worry: Not on file    Inability: Not on file  . Transportation needs:    Medical: Not on file    Non-medical: Not on file  Tobacco Use  . Smoking status: Current Every Day Smoker    Packs/day: 1.00    Years: 10.00    Pack years: 10.00    Types: Cigarettes    Last attempt to quit: 09/16/2012    Years since quitting: 5.9  . Smokeless tobacco: Never Used  Substance and Sexual Activity  . Alcohol use: Yes    Comment: 1 -2 drinks a month, occasional  . Drug use: No  . Sexual activity: Not Currently  Lifestyle  . Physical activity:    Days per week: Not on file    Minutes per session: Not on file  . Stress: Not on file  Relationships  . Social connections:    Talks on phone: Not on file    Gets together: Not on file    Attends religious service: Not on file    Active member of club or organization: Not on file    Attends meetings of clubs or organizations: Not on file    Relationship status: Not on file  . Intimate partner violence:    Fear of current or ex partner: Not on file    Emotionally abused: Not on file    Physically abused: Not on file    Forced sexual  activity: Not on file  Other Topics Concern  . Not on file  Social History Narrative  . Not on file     Review of Systems  Constitutional: Negative.   HENT: Negative.   Eyes: Negative.   Respiratory: Negative.   Cardiovascular: Negative.   Gastrointestinal: Negative.   Endocrine: Negative.   Genitourinary: Negative.   Musculoskeletal: Negative.   Skin: Negative.   Allergic/Immunologic: Negative.   Neurological: Negative.   Hematological: Negative.   Psychiatric/Behavioral: Negative.   All other systems reviewed and are negative.      Objective:    Vitals:   08/26/18 1218  BP: 122/76  Pulse: 82  Resp: 16  Temp: 97.8 F (36.6 C)  TempSrc: Oral  SpO2: 91%  Weight: 220 lb (99.8 kg)  Height: 5\' 8"  (1.727 m)      Physical Exam Vitals signs and nursing note reviewed.  Constitutional:      General: She is not in acute distress.    Appearance: She is well-developed. She is obese. She is not toxic-appearing or diaphoretic.  HENT:     Head: Normocephalic and atraumatic.     Jaw: There is normal jaw occlusion. Tenderness and swelling present. No trismus, pain on movement or malocclusion.     Salivary Glands: Left salivary gland is diffusely enlarged and tender.     Nose: Nose normal.     Mouth/Throat:     Mouth: Mucous membranes are moist.     Dentition: Abnormal dentition. Dental tenderness and dental caries present. No gingival swelling or dental abscesses.     Comments: Edema and tenderness to left cheek extending to left mid jawline No palpation of any dental abscess, no purulent drainage, no sublingual tenderness Airway intact Normal phonation Eyes:     General:        Right eye: No discharge.  Left eye: No discharge.     Conjunctiva/sclera: Conjunctivae normal.  Neck:     Trachea: No tracheal deviation.  Cardiovascular:     Rate and Rhythm: Normal rate and regular rhythm.  Pulmonary:     Effort: Pulmonary effort is normal. No respiratory  distress.     Breath sounds: No stridor.  Musculoskeletal: Normal range of motion.  Skin:    General: Skin is warm and dry.     Findings: No rash.  Neurological:     Mental Status: She is alert.     Motor: No abnormal muscle tone.     Coordination: Coordination normal.  Psychiatric:        Behavior: Behavior normal.           Assessment & Plan:      ICD-10-CM   1. Chronic dental infection K04.7   2. Cellulitis of face L03.211     Patient is a 58 year old female presents with recurrent worsening of chronic dental infection with swelling and tenderness to left cheek extending to left mid jaw, no signs of drainable dental abscess and no signs of Ludwick's angina. Cover again with clindamycin 300 mg 3 times daily x7 days, Diflucan for secondary yeast infection  Viewed signs of worsening infection and Ludwick's angina and ER precautions, patient verbalizes understanding, did caution about side effects with clindamycin and risk of C. difficile infection  Patient has been working on a definitive treatment for her recurrent dental infections and is pending insurance approval for multiple dental extractions.  Patient does understand she will need follow-up if the signs and symptoms of infection are not improving in the next 1 to 2 weeks or if at anytime they are worsening she understands she needs immediate follow-up   Delsa Grana, PA-C 08/26/18 12:51 PM

## 2018-08-28 DIAGNOSIS — C3490 Malignant neoplasm of unspecified part of unspecified bronchus or lung: Secondary | ICD-10-CM | POA: Diagnosis not present

## 2018-08-28 DIAGNOSIS — J9611 Chronic respiratory failure with hypoxia: Secondary | ICD-10-CM | POA: Diagnosis not present

## 2018-09-07 ENCOUNTER — Inpatient Hospital Stay (HOSPITAL_BASED_OUTPATIENT_CLINIC_OR_DEPARTMENT_OTHER): Payer: Medicare HMO | Admitting: Hematology

## 2018-09-07 ENCOUNTER — Telehealth: Payer: Self-pay | Admitting: Hematology

## 2018-09-07 ENCOUNTER — Inpatient Hospital Stay: Payer: Medicare HMO | Attending: Hematology

## 2018-09-07 ENCOUNTER — Other Ambulatory Visit: Payer: Self-pay

## 2018-09-07 VITALS — BP 134/55 | HR 83 | Temp 98.4°F | Resp 18 | Ht 68.0 in | Wt 223.6 lb

## 2018-09-07 DIAGNOSIS — D45 Polycythemia vera: Secondary | ICD-10-CM | POA: Diagnosis not present

## 2018-09-07 DIAGNOSIS — Z85820 Personal history of malignant melanoma of skin: Secondary | ICD-10-CM | POA: Diagnosis not present

## 2018-09-07 DIAGNOSIS — D471 Chronic myeloproliferative disease: Secondary | ICD-10-CM | POA: Diagnosis not present

## 2018-09-07 DIAGNOSIS — K047 Periapical abscess without sinus: Secondary | ICD-10-CM

## 2018-09-07 LAB — CBC WITH DIFFERENTIAL/PLATELET
ABS IMMATURE GRANULOCYTES: 0.17 10*3/uL — AB (ref 0.00–0.07)
Basophils Absolute: 0.2 10*3/uL — ABNORMAL HIGH (ref 0.0–0.1)
Basophils Relative: 1 %
Eosinophils Absolute: 0.4 10*3/uL (ref 0.0–0.5)
Eosinophils Relative: 2 %
HCT: 47.6 % — ABNORMAL HIGH (ref 36.0–46.0)
Hemoglobin: 13.1 g/dL (ref 12.0–15.0)
IMMATURE GRANULOCYTES: 1 %
Lymphocytes Relative: 10 %
Lymphs Abs: 2 10*3/uL (ref 0.7–4.0)
MCH: 21.2 pg — ABNORMAL LOW (ref 26.0–34.0)
MCHC: 27.5 g/dL — ABNORMAL LOW (ref 30.0–36.0)
MCV: 77 fL — ABNORMAL LOW (ref 80.0–100.0)
Monocytes Absolute: 0.7 10*3/uL (ref 0.1–1.0)
Monocytes Relative: 4 %
NEUTROS ABS: 16.6 10*3/uL — AB (ref 1.7–7.7)
Neutrophils Relative %: 82 %
PLATELETS: 464 10*3/uL — AB (ref 150–400)
RBC: 6.18 MIL/uL — ABNORMAL HIGH (ref 3.87–5.11)
RDW: 23 % — ABNORMAL HIGH (ref 11.5–15.5)
WBC: 20 10*3/uL — ABNORMAL HIGH (ref 4.0–10.5)
nRBC: 0.2 % (ref 0.0–0.2)

## 2018-09-07 LAB — CMP (CANCER CENTER ONLY)
ALT: 10 U/L (ref 0–44)
AST: 10 U/L — ABNORMAL LOW (ref 15–41)
Albumin: 3.3 g/dL — ABNORMAL LOW (ref 3.5–5.0)
Alkaline Phosphatase: 162 U/L — ABNORMAL HIGH (ref 38–126)
Anion gap: 13 (ref 5–15)
BUN: 23 mg/dL — ABNORMAL HIGH (ref 6–20)
CO2: 29 mmol/L (ref 22–32)
Calcium: 9.5 mg/dL (ref 8.9–10.3)
Chloride: 102 mmol/L (ref 98–111)
Creatinine: 0.85 mg/dL (ref 0.44–1.00)
GFR, Est AFR Am: >60 mL/min (ref >60)
GFR, Estimated: >60 mL/min (ref >60)
Glucose, Bld: 200 mg/dL — ABNORMAL HIGH (ref 70–99)
Potassium: 4.2 mmol/L (ref 3.5–5.1)
Sodium: 144 mmol/L (ref 135–145)
Total Bilirubin: 0.3 mg/dL (ref 0.3–1.2)
Total Protein: 7.7 g/dL (ref 6.5–8.1)

## 2018-09-07 LAB — LACTATE DEHYDROGENASE: LDH: 302 U/L — AB (ref 98–192)

## 2018-09-07 NOTE — Telephone Encounter (Signed)
Scheduled appt per 3/11 los. ° °Printed calendar and avs. °

## 2018-09-07 NOTE — Progress Notes (Signed)
HEMATOLOGY/ONCOLOGY CLINIC NOTE  Date of Service: 09/07/2018  Patient Care Team: [facility], Modena Nunnery, MD as PCP - General (Family Medicine) Fanny Bien, MD as Attending Physician (Family Medicine) Everardo All, MD (Hematology and Oncology) [name], MD as Attending Physician (Cardiothoracic Surgery)  CHIEF COMPLAINTS/PURPOSE OF CONSULTATION:  Continue mx of Jak2 mutation positive MPN  HISTORY OF PRESENTING ILLNESS:   Katelyn Cline is a wonderful 58 y.o. female who has been referred to [place] by Dr. Tana Coast  for evaluation and management of Leukocytosis. The pt reports that she is doing well overall.   The pt presented to the ED on 03/11/18 with significant weakness, hyperkalemia, and increasing leukocytosis with her PCP. She also notes that she had abscesses and facial infections about 4 weeks ago and received Clindamycin. She then developed a vaginal yeast infection, treated with Monistat and worsened to lesions and sores. The pt notes that since beginning antibiotics her cough has improved.   Patient report having a h/o melanoma metastatic to lung s/pwedge resection in 2014 . No recurrence since then. She notes she was been monitored with CT scans since then.  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  Patient notes that she previous needed therapeutic phlebotomy along time back but  denies ever taking medication for her polycythemia vera.   The pt notes that she used to sleep with a CPAP for many years. She then began treatment for her narcolepsy. The pt notes that she is on O2 and has remained on O2 since her surgery in 2014 to resect the melanoma. She has degenerative disc disease, fibromyalgia, and neuropathy in her feet.   The pt notes that she feels less "groggy-headed" today.   Most  recent lab results (03/14/18) of CBC and BMP is as follows: all values are WNL except for WBC at 31.0k, RBC at 6.97, HCT at 47.1, MCV at 67.6, MCH at 19.7, MCHC at 29.1, RDW at 22.3, PLT at 542k, Glucose at 138.  On review of systems, pt reports recent infections, feeling tired, moving her bowels well, resolved cough and denies abdominal pains, problems passing urine, and any other symptoms.   On PMHx the pt reports basal cell carcinoma in 2013, metastatic melanoma to lung wedge resected with thoracoscopy on 10/21/12.  Interval History:   Katelyn Cline returns today for management and evaluation of her polycythemia vera. The patient's last visit with [place] was on 07/07/18. The pt reports that she is doing well overall.   The pt reports that she had a recent cellulitis and dental infection, presented to her PCP's office, is anticipating surgery, and finished an antibiotic course 3 days ago.   The pt has continued to take Hydroxyurea with complete compliance. She notes that she continues smoking a little less than a pack of cigarettes each day. She notes that she has been staying hydrated in the last couple days, but generally does not stay well hydrated.  Lab results today (09/07/18) of CBC w/diff and CMP is as follows: all values are WNL except for WBC at 20.0k, RBC at 6.18, HCT at 47.6, MCV at 77.0, MCH at 21.2, MCHC at 27.5, RDW at 23.0, PLT at 464k, ANC at 16.6, Basophils abs at 200, Abs immature granulocytes at 0.17k, Glucose at 200, BUN at 23, Albumin at 3.3, AST  at 10, Alk Phos at 162. 09/07/18 LDH is pending  On review of systems, pt reports recent infection, and denies abdominal pains, leg swelling, and any other symptoms.  MEDICAL HISTORY:  Past Medical History:  Diagnosis Date  . Agoraphobia   . Basal cell carcinoma   . Chronic pain   . Chronic respiratory failure with hypoxia ([name])    3L [place]  . DM type 2 (diabetes mellitus, type 2) ([place]) 10/16/2013  . Dysrhythmia    Hx: of  palpitations "a long time ago"  . Elevated hemoglobin ([place]) 2014  . Fibromyalgia   . History of UTI   . HTN (hypertension)   . Hyperlipidemia   . Major depression   . Metastatic melanoma ([place]) 10/18/2012   12 mm posterior right upper lobe pulmonary nodule, max SUV 3.0    . Migraines   . Miscarriage    x 4  . Narcolepsy   . Panic attacks   . Peripheral neuropathy   . Peripheral neuropathy    in feet  . Polycythemia secondary to smoking   . Polycythemia vera(238.4)   . Scoliosis   . Shortness of breath    Hx: of with activity  . Sleep apnea   . Vertigo     SURGICAL HISTORY: Past Surgical History:  Procedure Laterality Date  . BASAL CELL CARCINOMA EXCISION     flap surgery on face  . BREAST LUMPECTOMY     bilateral  . COLONOSCOPY W/ BIOPSIES AND POLYPECTOMY     Hx: of  . CYSTECTOMY     abdominal wall   . DILATION AND CURETTAGE OF UTERUS     Hx: of   . VAGINAL HYSTERECTOMY    . VIDEO ASSISTED THORACOSCOPY (VATS)/WEDGE RESECTION Right 10/21/2012   Procedure: VIDEO ASSISTED THORACOSCOPY (VATS)/WEDGE RESECTION;  Surgeon: [name], MD;  Location: [facility];  Service: Thoracic;  Laterality: Right;  Marland Kitchen VIDEO BRONCHOSCOPY N/A 10/21/2012   Procedure: VIDEO BRONCHOSCOPY;  Surgeon: [name], MD;  Location: [facility] OR;  Service: Thoracic;  Laterality: N/A;    SOCIAL HISTORY: Social History   Socioeconomic History  . Marital status: Divorced    Spouse name: Not on file  . Number of children: Not on file  . Years of education: 12+  . Highest education level: Not on file  Occupational History  . Not on file  Social Needs  . Financial resource strain: Not on file  . Food insecurity:    Worry: Not on file    Inability: Not on file  . Transportation needs:    Medical: Not on file    Non-medical: Not on file  Tobacco Use  . Smoking status: Current Every Day Smoker    Packs/day: 1.00    Years: 10.00    Pack years: 10.00    Types: Cigarettes    Last attempt to  quit: 09/16/2012    Years since quitting: 5.9  . Smokeless tobacco: Never Used  Substance and Sexual Activity  . Alcohol use: Yes    Comment: 1 -2 drinks a month, occasional  . Drug use: No  . Sexual activity: Not Currently  Lifestyle  . Physical activity:    Days per week: Not on file    Minutes per session: Not on file  . Stress: Not on file  Relationships  . Social connections:    Talks on phone: Not on file    Gets together: Not on file    Attends religious service: Not on  file    Active member of club or organization: Not on file    Attends meetings of clubs or organizations: Not on file    Relationship status: Not on file  . Intimate partner violence:    Fear of current or ex partner: Not on file    Emotionally abused: Not on file    Physically abused: Not on file    Forced sexual activity: Not on file  Other Topics Concern  . Not on file  Social History Narrative  . Not on file    FAMILY HISTORY: Family History  Problem Relation Age of Onset  . Arthritis Mother   . Hyperlipidemia Mother   . Depression Mother   . Anxiety disorder Mother   . Dementia Mother   . Hypertension Father   . Hyperlipidemia Father   . Heart disease Father   . Stroke Father   . Dementia Father   . Heart disease Brother   . ADD / ADHD Son   . Alcohol abuse Maternal Grandfather   . Bipolar disorder Neg Hx   . Drug abuse Neg Hx   . OCD Neg Hx   . Paranoid behavior Neg Hx   . Schizophrenia Neg Hx   . Seizures Neg Hx   . Sexual abuse Neg Hx   . Physical abuse Neg Hx     ALLERGIES:  is allergic to contrast media [iodinated diagnostic agents]; erythromycin; flagyl [metronidazole]; latex; other; penicillins; and tetracyclines & related.  MEDICATIONS:  Current Outpatient Medications  Medication Sig Dispense Refill  . amLODipine (NORVASC) 5 MG tablet Take 1 tablet (5 mg total) by mouth daily. 90 tablet 2  . aspirin EC 81 MG tablet Take 81 mg by mouth daily.    Marland Kitchen atorvastatin  (LIPITOR) 80 MG tablet Take 1 tablet (80 mg total) by mouth daily at 6 PM. 90 tablet 3  . baclofen (LIORESAL) 10 MG tablet Take 10 mg by mouth 2 (two) times daily as needed for muscle spasms.    . chlorhexidine (PERIDEX) 0.12 % solution Use as directed 15 mLs in the mouth or throat 2 (two) times daily. Swish and spit. Do not swallow. 473 mL 0  . DULoxetine (CYMBALTA) 60 MG capsule TAKE 2 CAPSULES BY MOUTH EVERY DAY 60 capsule 2  . empagliflozin (JARDIANCE) 25 MG TABS tablet Take 25 mg by mouth daily. 90 tablet 4  . fenofibrate (TRICOR) 48 MG tablet TAKE 1 TABLET(48 MG) BY MOUTH DAILY 90 tablet 0  . fish oil-omega-3 fatty acids 1000 MG capsule Take 2 g by mouth 2 (two) times daily.    . fluconazole (DIFLUCAN) 150 MG tablet Take 1 tablet (150 mg total) by mouth every 3 (three) days as needed for up to 2 doses. 2 tablet 0  . fluconazole (DIFLUCAN) 150 MG tablet Take 1 tablet (150 mg total) by mouth every 3 (three) days as needed (for vaginal itching/yeast infection sx). 2 tablet 0  . gabapentin (NEURONTIN) 100 MG capsule TAKE 1 TO 3 CAPSULES(100 TO 300 MG) BY MOUTH THREE TIMES DAILY AS NEEDED FOR NERVE PAIN 180 capsule 1  . Glucose Blood (BLOOD GLUCOSE TEST STRIPS) STRP Please dispense based on insurance and patient preference. Use as directed to monitor FSBS 1x daily. Dx: E11.9. 100 each 3  . hydrochlorothiazide (HYDRODIURIL) 25 MG tablet Take 1 tablet (25 mg total) by mouth daily. 90 tablet 3  . hydroxyurea (HYDREA) 500 MG capsule Take 2 caps (1000 mg) on Monday, Wednesday and Friday and 1  cap ('500mg'$ ) the rest of the days. May take with food to minimize nausea. 60 capsule 2  . lisinopril (ZESTRIL) 2.5 MG tablet Take 1 tablet (2.5 mg total) by mouth daily. 90 tablet 2  . metFORMIN (GLUCOPHAGE) 1000 MG tablet TAKE 1 TABLET BY MOUTH TWICE DAILY WITH A MEAL 180 tablet 0  . MICROLET LANCETS MISC Please dispense based on patient and insurance preference. Use as directed to monitor  FSBS 1x daily. Dx: E11.9.  100 each 11  . oxyCODONE-acetaminophen (PERCOCET) 7.5-325 MG tablet Take 1 tablet by mouth 4 (four) times daily as needed for severe pain.    . Pseudoeph-Doxylamine-DM-APAP (NYQUIL PO) Take 1-2 capsules by mouth at bedtime as needed (for cold/flu).    . valACYclovir (VALTREX) 1000 MG tablet Take 1 tablet (1,000 mg total) by mouth 2 (two) times daily. 20 tablet 0  . varenicline (CHANTIX STARTING MONTH PAK) 0.5 MG X 11 & 1 MG X 42 tablet Take one 0.5 mg tablet by mouth once daily for 3 days, then increase to one 0.5 mg tablet twice daily for 4 days, then increase to one 1 mg tablet twice daily. 53 tablet 0   No current facility-administered medications for this visit.     REVIEW OF SYSTEMS:    A 10+ POINT REVIEW OF SYSTEMS WAS OBTAINED including neurology, dermatology, psychiatry, cardiac, respiratory, lymph, extremities, GI, GU, Musculoskeletal, constitutional, breasts, reproductive, HEENT.  All pertinent positives are noted in the HPI.  All others are negative.   PHYSICAL EXAMINATION: ECOG PERFORMANCE STATUS: 2 - Symptomatic, <50% confined to bed  Vitals:   09/07/18 1444  BP: (!) 134/55  Pulse: 83  Resp: 18  Temp: 98.4 F (36.9 C)  SpO2: 90%   Filed Weights   09/07/18 1444  Weight: 223 lb 9.6 oz (101.4 kg)   .Body mass index is 34 kg/m.  GENERAL:alert, in no acute distress and comfortable SKIN: no acute rashes, no significant lesions EYES: conjunctiva are pink and non-injected, sclera anicteric OROPHARYNX: MMM, no exudates, no oropharyngeal erythema or ulceration NECK: supple, no JVD LYMPH:  no palpable lymphadenopathy in the cervical, axillary or inguinal regions LUNGS: clear to auscultation b/l with normal respiratory effort HEART: regular rate & rhythm ABDOMEN:  normoactive bowel sounds , non tender, not distended. Splenomegaly 3 fingerbreadths under costal margin.Marland Kitchen  Extremity: no pedal edema PSYCH: alert & oriented x 3 with fluent speech NEURO: no focal motor/sensory  deficits   LABORATORY DATA:  I have reviewed the data as listed  . CBC Latest Ref Rng & Units 09/07/2018 07/07/2018 05/19/2018  WBC 4.0 - 10.5 K/uL 20.0(H) 20.5(H) 25.2(H)  Hemoglobin 12.0 - 15.0 g/dL 13.1 12.3 12.7  Hematocrit 36.0 - 46.0 % 47.6(H) 45.1 45.5  Platelets 150 - 400 K/uL 464(H) 335 565(H)   . CBC    Component Value Date/Time   WBC 20.0 (H) 09/07/2018 1354   RBC 6.18 (H) 09/07/2018 1354   HGB 13.1 09/07/2018 1354   HGB 12.7 05/19/2018 0811   HCT 47.6 (H) 09/07/2018 1354   PLT 464 (H) 09/07/2018 1354   PLT 565 (H) 05/19/2018 0811   MCV 77.0 (L) 09/07/2018 1354   MCH 21.2 (L) 09/07/2018 1354   MCHC 27.5 (L) 09/07/2018 1354   RDW 23.0 (H) 09/07/2018 1354   LYMPHSABS 2.0 09/07/2018 1354   MONOABS 0.7 09/07/2018 1354   EOSABS 0.4 09/07/2018 1354   BASOSABS 0.2 (H) 09/07/2018 1354     . CMP Latest Ref Rng & Units 09/07/2018 07/07/2018 05/19/2018  Glucose 70 - 99 mg/dL 200(H) 167(H) 152(H)  BUN 6 - 20 mg/dL 23(H) 15 18  Creatinine 0.44 - 1.00 mg/dL 0.85 0.88 0.76  Sodium 135 - 145 mmol/L 144 142 140  Potassium 3.5 - 5.1 mmol/L 4.2 4.9 4.5  Chloride 98 - 111 mmol/L 102 104 102  CO2 22 - 32 mmol/L '29 29 28  '$ Calcium 8.9 - 10.3 mg/dL 9.5 9.8 9.8  Total Protein 6.5 - 8.1 g/dL 7.7 7.3 7.5  Total Bilirubin 0.3 - 1.2 mg/dL 0.3 0.4 0.3  Alkaline Phos 38 - 126 U/L 162(H) 163(H) 142(H)  AST 15 - 41 U/L 10(L) 10(L) 10(L)  ALT 0 - 44 U/L '10 10 10   '$ 08/15/12 JAK2 Mutation:     03/15/18 BM Bx:      RADIOGRAPHIC STUDIES: I have personally reviewed the radiological images as listed and agreed with the findings in the report. No results found.  ASSESSMENT & PLAN:   58 y.o. female with  1. Jak2 V617F mutation positive Myeloproliferative syndrome.- BM Bx consistent with Polycythemia Vera  JAK2 mutation identified in 08/15/12 labs   03/15/18 BM Bx revealed results consistent with JAK2 positive polycythemia vera   Current hgb.hct are at 14/47.6 Thrombocytosis PLT  725k  2. Increasing leucocytosis - primarily neutrophilia. - related to Polycythemia vera Patient with recent dental infections/abscesses and respiratory infection. Part of Leukocytosis could be due to his MPN  - PCV vs Myelofibrosis.  Neutrophilia began in March 2015 at 9.6k, slowly increased to 14k in August 2018, 19k in March 2019, now to [place] in September 2019.  3. Polycythemia vera Confirmed by 08/15/12 JAK 2 mutation test  4. History of Metastatic melanoma to the right lung  S/p resection by Dr. Servando Snare on 10/21/12, BRAF mutation not detected. -continue q34monthy skin screening her PCP/dermatologist -symptom directed radiologic scanning.   PLAN: -Discussed pt labwork today, 09/07/18; HGB normal at 13.1, HCT at 47.6, PLT at 464k. Improved neutrophilia with ANC at 16.6. LDH at 302 -Discussed that in light of the patient's recent infection, I suspect her increased blood counts are reactive and not a progression through Hydroxyurea -The pt has no prohibitive toxicities from continuing '1000mg'$  Hydroxyurea 3 days a week, and '500mg'$  Hydroxyurea 4 days a week at this time. -Recommend staying well hydrated -PLT goal is to be between 200k and 400k -HCT goal is to have HCT <45% -Now pursuing maintenance phlebotomies, trend has been stable over the last month without successful phlebotomies  -Will watch for signs of chronic inflammation -Recommended that the pt continue to abide by sun protection strategies as Hydroxyurea can sensitize the skin  -Continue ASA '81mg'$  po daily  -Counseled the pt towards complete smoking cessation and explained that this would serve as a cause for increased polycythemia and strokes -Recommend Peridex mouthwash -Will refer the pt to our dentist Dr KEnrique Sack -Recommend checking thyroid levels with PCP  -Encouraged her to maintenance compliance with her medications -Recommended that the pt continue to eat well, drink at least 48-64 oz of water each day, and walk 20-30  minutes each day. -Will see the pt back in 3 months, with therapeutic phlebotomy   RTC with Dr KIrene Limbowith labs and therapeutic phlebotomy appointment in 373monthReferral to Dr KuDoreen Beamor recurrent dental asbcesses   All of the patients questions were answered with apparent satisfaction. The patient knows to call the clinic with any problems, questions or concerns.  The total time spent in the appt was 25 minutes and more  than 50% was on counseling and direct patient cares.    Sullivan Lone MD MS AAHIVMS [facility] Hematology/Oncology Physician [facility]  (Office):       [phone] (Work cell):  [phone] (Fax):           [phone]  09/07/2018 3:18 PM  I, Baldwin Jamaica, am acting as a scribe for Dr. Sullivan Lone.   .I have reviewed the above documentation for accuracy and completeness, and I agree with the above. [name] MD

## 2018-09-08 DIAGNOSIS — Z79899 Other long term (current) drug therapy: Secondary | ICD-10-CM | POA: Diagnosis not present

## 2018-09-08 DIAGNOSIS — M5030 Other cervical disc degeneration, unspecified cervical region: Secondary | ICD-10-CM | POA: Diagnosis not present

## 2018-09-08 DIAGNOSIS — G894 Chronic pain syndrome: Secondary | ICD-10-CM | POA: Diagnosis not present

## 2018-09-08 DIAGNOSIS — F1120 Opioid dependence, uncomplicated: Secondary | ICD-10-CM | POA: Diagnosis not present

## 2018-09-14 ENCOUNTER — Ambulatory Visit (HOSPITAL_COMMUNITY): Payer: Self-pay | Admitting: Dentistry

## 2018-09-14 ENCOUNTER — Encounter (HOSPITAL_COMMUNITY): Payer: Self-pay | Admitting: Dentistry

## 2018-09-14 ENCOUNTER — Other Ambulatory Visit: Payer: Self-pay

## 2018-09-14 VITALS — BP 130/62 | Temp 79.0°F

## 2018-09-14 DIAGNOSIS — D45 Polycythemia vera: Secondary | ICD-10-CM | POA: Diagnosis not present

## 2018-09-14 DIAGNOSIS — K0530 Chronic periodontitis, unspecified: Secondary | ICD-10-CM

## 2018-09-14 DIAGNOSIS — D471 Chronic myeloproliferative disease: Secondary | ICD-10-CM | POA: Diagnosis not present

## 2018-09-14 DIAGNOSIS — K083 Retained dental root: Secondary | ICD-10-CM

## 2018-09-14 DIAGNOSIS — K036 Deposits [accretions] on teeth: Secondary | ICD-10-CM

## 2018-09-14 DIAGNOSIS — K08409 Partial loss of teeth, unspecified cause, unspecified class: Secondary | ICD-10-CM

## 2018-09-14 DIAGNOSIS — K0889 Other specified disorders of teeth and supporting structures: Secondary | ICD-10-CM

## 2018-09-14 DIAGNOSIS — M264 Malocclusion, unspecified: Secondary | ICD-10-CM

## 2018-09-14 DIAGNOSIS — K045 Chronic apical periodontitis: Secondary | ICD-10-CM

## 2018-09-14 DIAGNOSIS — K06010 Localized gingival recession, unspecified: Secondary | ICD-10-CM

## 2018-09-14 DIAGNOSIS — J392 Other diseases of pharynx: Secondary | ICD-10-CM

## 2018-09-14 DIAGNOSIS — K029 Dental caries, unspecified: Secondary | ICD-10-CM

## 2018-09-14 DIAGNOSIS — K0401 Reversible pulpitis: Secondary | ICD-10-CM

## 2018-09-14 NOTE — Progress Notes (Signed)
DENTAL CONSULTATION  Date of Consultation:  09/14/2018 Patient Name:   Katelyn Cline Date of Birth:   26-Nov-1960 Medical Record Number: [id number]  VITALS: BP 130/62 (BP Location: Right Arm)   Temp (!) 79 F (26.1 C)   CHIEF COMPLAINT: Patient referred by [name] for a dental consultation.  HPI: Katelyn Cline Is a 58 year old female with history of myeloproliferative disorder and polycythemia vera. Patient has a history of multiple dental abscesses by report. Patient referred by [name] for evaluation of poor dentition. Patient is now seen to rule out dental infection that may affect the patient systemic health.  The patient has a history of acute pulpitis symptoms coming from multiple quadrants in her mouth. Patient indicates that it hurts at a 10 out of 10 in intensity when it is hurting. The patient is currently having 2 out of 10 pain and the pain is dull and aching. Patient has a history of sharp, spontaneous pain as well as a history of dull achy pain. This pain has been occurring for many months.  The patient last saw Dr. Luvenia Heller, oral surgeon, for a dental evaluation. This was approximately in late November or December 2019. Patient was informed that she needed multiple dental extractions at that time. The patient did not follow-up with dental treatment at that time.  Patient denies having dental phobia.  PROBLEM LIST: Patient Active Problem List   Diagnosis Date Noted  . MPN (myeloproliferative neoplasm) ([place])   . Leukocytosis 03/12/2018  . Symptoms of upper respiratory infection (URI) 03/12/2018  . Dental caries 03/12/2018  . Aortic atherosclerosis ([place]) 02/21/2018  . Constipation 05/28/2015  . OE (otitis externa) 01/04/2015  . Seborrheic keratoses 08/24/2014  . PVC (premature ventricular contraction) 05/18/2014  . Gastroenteritis 02/15/2014  . Diabetes mellitus type II, controlled ([place]) 02/06/2013  . Chronic hypoxemic respiratory failure ([place]) 02/06/2013  . History of  basal cell carcinoma 12/01/2012  . Posttraumatic stress disorder 12/01/2012  . History of malignant melanoma 10/18/2012  . Polycythemia vera ([place]) 08/05/2012  . Hyperlipidemia 04/09/2012  . Narcolepsy 04/09/2012  . Peripheral neuropathy 04/09/2012  . Tobacco use disorder 04/09/2012  . Obesity 04/09/2012  . Essential hypertension, benign 04/09/2012  . DDD (degenerative disc disease), lumbar 02/16/2012  . DDD (degenerative disc disease), cervical 02/16/2012  . Chronic pain disorder 02/16/2012  . Depression 02/16/2012  . Fibromyalgia 02/16/2012    PMH: Past Medical History:  Diagnosis Date  . Agoraphobia   . Basal cell carcinoma   . Chronic pain   . Chronic respiratory failure with hypoxia ([name])    3L   . DM type 2 (diabetes mellitus, type 2) ([place]) 10/16/2013  . Dysrhythmia    Hx: of palpitations "a long time ago"  . Elevated hemoglobin ([place]) 2014  . Fibromyalgia   . History of UTI   . HTN (hypertension)   . Hyperlipidemia   . Major depression   . Metastatic melanoma ([place]) 10/18/2012   12 mm posterior right upper lobe pulmonary nodule, max SUV 3.0    . Migraines   . Miscarriage    x 4  . Narcolepsy   . Panic attacks   . Peripheral neuropathy   . Peripheral neuropathy    in feet  . Polycythemia secondary to smoking   . Polycythemia vera(238.4)   . Scoliosis   . Shortness of breath    Hx: of with activity  . Sleep apnea   . Vertigo     PSH: Past Surgical History:  Procedure Laterality Date  . BASAL CELL CARCINOMA EXCISION     flap surgery on face  . BREAST LUMPECTOMY     bilateral  . COLONOSCOPY W/ BIOPSIES AND POLYPECTOMY     Hx: of  . CYSTECTOMY     abdominal wall   . DILATION AND CURETTAGE OF UTERUS     Hx: of   . VAGINAL HYSTERECTOMY    . VIDEO ASSISTED THORACOSCOPY (VATS)/WEDGE RESECTION Right 10/21/2012   Procedure: VIDEO ASSISTED THORACOSCOPY (VATS)/WEDGE RESECTION;  Surgeon: [name], MD;  Location: [facility];  Service: Thoracic;  Laterality:  Right;  Marland Kitchen VIDEO BRONCHOSCOPY N/A 10/21/2012   Procedure: VIDEO BRONCHOSCOPY;  Surgeon: [name], MD;  Location: [facility] OR;  Service: Thoracic;  Laterality: N/A;    ALLERGIES: Allergies  Allergen Reactions  . Contrast Media [Iodinated Diagnostic Agents] Anaphylaxis  . Erythromycin   . Flagyl [Metronidazole] Other (See Comments)    Generalized pain.  . Latex Other (See Comments)    Was told to be careful because of Dye allergy  . Other     Iodine Dye  . Penicillins Other (See Comments)    Patient was an infant, no idea of reaction. Tolerates Keflex.  . Tetracyclines & Related Other (See Comments)    GI side effects    MEDICATIONS: Current Outpatient Medications  Medication Sig Dispense Refill  . amLODipine (NORVASC) 5 MG tablet Take 1 tablet (5 mg total) by mouth daily. 90 tablet 2  . aspirin EC 81 MG tablet Take 81 mg by mouth daily.    Marland Kitchen atorvastatin (LIPITOR) 80 MG tablet Take 1 tablet (80 mg total) by mouth daily at 6 PM. 90 tablet 3  . baclofen (LIORESAL) 10 MG tablet Take 10 mg by mouth 2 (two) times daily as needed for muscle spasms.    . DULoxetine (CYMBALTA) 60 MG capsule TAKE 2 CAPSULES BY MOUTH EVERY DAY 60 capsule 2  . empagliflozin (JARDIANCE) 25 MG TABS tablet Take 25 mg by mouth daily. 90 tablet 4  . fenofibrate (TRICOR) 48 MG tablet TAKE 1 TABLET(48 MG) BY MOUTH DAILY 90 tablet 0  . fish oil-omega-3 fatty acids 1000 MG capsule Take 2 g by mouth 2 (two) times daily.    Marland Kitchen gabapentin (NEURONTIN) 100 MG capsule TAKE 1 TO 3 CAPSULES(100 TO 300 MG) BY MOUTH THREE TIMES DAILY AS NEEDED FOR NERVE PAIN 180 capsule 1  . Glucose Blood (BLOOD GLUCOSE TEST STRIPS) STRP Please dispense based on insurance and patient preference. Use as directed to monitor FSBS 1x daily. Dx: E11.9. 100 each 3  . hydrochlorothiazide (HYDRODIURIL) 25 MG tablet Take 1 tablet (25 mg total) by mouth daily. 90 tablet 3  . hydroxyurea (HYDREA) 500 MG capsule Take 2 caps (1000 mg) on Monday, Wednesday  and Friday and 1 cap (500mg ) the rest of the days. May take with food to minimize nausea. 60 capsule 2  . lisinopril (ZESTRIL) 2.5 MG tablet Take 1 tablet (2.5 mg total) by mouth daily. 90 tablet 2  . metFORMIN (GLUCOPHAGE) 1000 MG tablet TAKE 1 TABLET BY MOUTH TWICE DAILY WITH A MEAL 180 tablet 0  . MICROLET LANCETS MISC Please dispense based on patient and insurance preference. Use as directed to monitor  FSBS 1x daily. Dx: E11.9. 100 each 11  . oxyCODONE-acetaminophen (PERCOCET) 7.5-325 MG tablet Take 1 tablet by mouth 4 (four) times daily as needed for severe pain.    . chlorhexidine (PERIDEX) 0.12 % solution Use as directed 15 mLs in  the mouth or throat 2 (two) times daily. Swish and spit. Do not swallow. 473 mL 0  . fluconazole (DIFLUCAN) 150 MG tablet Take 1 tablet (150 mg total) by mouth every 3 (three) days as needed for up to 2 doses. (Patient not taking: Reported on 09/14/2018) 2 tablet 0  . fluconazole (DIFLUCAN) 150 MG tablet Take 1 tablet (150 mg total) by mouth every 3 (three) days as needed (for vaginal itching/yeast infection sx). (Patient not taking: Reported on 09/14/2018) 2 tablet 0  . Pseudoeph-Doxylamine-DM-APAP (NYQUIL PO) Take 1-2 capsules by mouth at bedtime as needed (for cold/flu).    . valACYclovir (VALTREX) 1000 MG tablet Take 1 tablet (1,000 mg total) by mouth 2 (two) times daily. 20 tablet 0  . varenicline (CHANTIX STARTING MONTH PAK) 0.5 MG X 11 & 1 MG X 42 tablet Take one 0.5 mg tablet by mouth once daily for 3 days, then increase to one 0.5 mg tablet twice daily for 4 days, then increase to one 1 mg tablet twice daily. 53 tablet 0   No current facility-administered medications for this visit.     LABS: Lab Results  Component Value Date   WBC 20.0 (H) 09/07/2018   HGB 13.1 09/07/2018   HCT 47.6 (H) 09/07/2018   MCV 77.0 (L) 09/07/2018   PLT 464 (H) 09/07/2018      Component Value Date/Time   NA 144 09/07/2018 1354   K 4.2 09/07/2018 1354   CL 102 09/07/2018  1354   CO2 29 09/07/2018 1354   GLUCOSE 200 (H) 09/07/2018 1354   BUN 23 (H) 09/07/2018 1354   CREATININE 0.85 09/07/2018 1354   CREATININE 0.70 04/28/2018 0854   CALCIUM 9.5 09/07/2018 1354   GFRNONAA >60 09/07/2018 1354   GFRNONAA 95 04/28/2018 0854   GFRAA >60 09/07/2018 1354   GFRAA 110 04/28/2018 0854   Lab Results  Component Value Date   INR 1.00 03/12/2018   INR 0.90 10/19/2012   No results found for: PTT  SOCIAL HISTORY: Social History   Socioeconomic History  . Marital status: Divorced    Spouse name: Not on file  . Number of children: Not on file  . Years of education: 12+  . Highest education level: Not on file  Occupational History  . Not on file  Social Needs  . Financial resource strain: Not on file  . Food insecurity:    Worry: Not on file    Inability: Not on file  . Transportation needs:    Medical: Not on file    Non-medical: Not on file  Tobacco Use  . Smoking status: Current Every Day Smoker    Packs/day: 1.00    Years: 10.00    Pack years: 10.00    Types: Cigarettes    Last attempt to quit: 09/16/2012    Years since quitting: 5.9  . Smokeless tobacco: Never Used  Substance and Sexual Activity  . Alcohol use: Yes    Comment: 1 -2 drinks a month, occasional  . Drug use: No  . Sexual activity: Not Currently  Lifestyle  . Physical activity:    Days per week: Not on file    Minutes per session: Not on file  . Stress: Not on file  Relationships  . Social connections:    Talks on phone: Not on file    Gets together: Not on file    Attends religious service: Not on file    Active member of club or organization: Not on file  Attends meetings of clubs or organizations: Not on file    Relationship status: Not on file  . Intimate partner violence:    Fear of current or ex partner: Not on file    Emotionally abused: Not on file    Physically abused: Not on file    Forced sexual activity: Not on file  Other Topics Concern  . Not on file   Social History Narrative  . Not on file    FAMILY HISTORY: Family History  Problem Relation Age of Onset  . Arthritis Mother   . Hyperlipidemia Mother   . Depression Mother   . Anxiety disorder Mother   . Dementia Mother   . Hypertension Father   . Hyperlipidemia Father   . Heart disease Father   . Stroke Father   . Dementia Father   . Heart disease Brother   . ADD / ADHD Son   . Alcohol abuse Maternal Grandfather   . Bipolar disorder Neg Hx   . Drug abuse Neg Hx   . OCD Neg Hx   . Paranoid behavior Neg Hx   . Schizophrenia Neg Hx   . Seizures Neg Hx   . Sexual abuse Neg Hx   . Physical abuse Neg Hx     REVIEW OF SYSTEMS: Reviewed with the patient as per History of present illness. Psych: The patient has a history of anxiety and depression and Agoraphobia. Patient denies having dental phobia.  DENTAL HISTORY: CHIEF COMPLAINT: Patient referred by [name] for a dental consultation.  HPI: Katelyn Cline Is a 58 year old female with history of myeloproliferative disorder and polycythemia vera. Patient has a history of multiple dental abscesses by report. Patient referred by [name] for evaluation of poor dentition. Patient is now seen to rule out dental infection that may affect the patient systemic health.  The patient has a history of acute pulpitis symptoms coming from multiple quadrants in her mouth. Patient indicates that it hurts at a 10 out of 10 in intensity when it is hurting. The patient is currently having 2 out of 10 pain and the pain is dull and aching. Patient has a history of sharp, spontaneous pain as well as a history of dull achy pain. This pain has been occurring for many months.  The patient last saw Dr. Luvenia Heller, oral surgeon, for a dental evaluation. This was approximately in late November or December 2019. Patient was informed that she needed multiple dental extractions at that time. The patient did not follow-up with dental treatment at that time.   Patient denies having dental phobia.  DENTAL EXAMINATION: GENERAL:  The patient is a well-developed, well-nourished female in no acute distress. HEAD AND NECK:  There is no palpable neck lymphadenopathy. The patient denies acute TMJ symptoms. INTRAORAL EXAM:  The patient has normal saliva. There is no overt abscess formation but patient is sensitive to palpation in the area of tooth #20 on the buccal aspect. DENTITION:  Patient has multiple missing teeth numbers 1, 4, 5, 15, 16, 17, 19, and 32. There are retained root segments in the area tooth numbers 3, 11, 12, 13, 14, 30, and 31. PERIODONTAL:  The patient has chronic periodontitis with plaque and calculus accumulations, generalized gingival recession, and tooth mobility. There is moderate to severe bone loss noted. DENTAL CARIES/SUBOPTIMAL RESTORATIONS:  The patient has multiple dental caries. Many of the caries are impinging on the pulp. ENDODONTIC: The patient has a history of acute pulpitis symptoms. Patient has multiple areas  of periapical pathology and radiolucency. Patient has multiple previous root canal therapies associated with the retained root segments in the area of tooth numbers 3, 14, 30, and 31. CROWN AND BRIDGE:  There are no crown or bridge restorations. PROSTHODONTIC: The patient denies having partial dentures. OCCLUSION:  The patient has a poor occlusal scheme secondary to multiple missing teeth, multiple retained root segments, and lack replacement of missing teeth with dental prostheses.  RADIOGRAPHIC INTERPRETATION: An orthopantogram was taken and supplemented with a full series of periapical radiographs. There are multiple missing teeth. There are multiple retained root segments. There are multiple areas of periapical pathology and radiolucency. Multiple dental caries are noted.  There is supra-eruption and drifting of the unopposed teeth into the edentulous areas. Multiple retained root segments have previous root canal  therapies including tooth numbers 3, 14, 30, and 31.   ASSESSMENTS: 1. Myeloproliferative neoplasm 2. Polycythemia vera 3. Acute pulpitis 4. Chronic apical periodontitis 5. Multiple retained root segments 6. Multiple dental caries 7. Chronic periodontitis with bone loss 8. Gingival recession 9. Accretions 10. Tooth mobility 11. Multiple missing teeth 12. Supra-eruption and drifting of the unopposed teeth into the edentulous areas 13. No history of partial dentures 14. Poor occlusal scheme and malocclusion 15. Hyperactive gag reflex  PLAN/RECOMMENDATIONS: 1. I discussed the risks, benefits, and complications of various treatment options with the patient in relationship to her medical and dental conditions and risk for infection and bleeding. We discussed various treatment options to include no treatment, total and subtotal extractions with alveoloplasty, pre-prosthetic surgery as indicated, periodontal therapy, dental restorations, root canal therapy, crown and bridge therapy, implant therapy, and replacement of missing teeth as indicated. The patient currently wishes to proceed with extraction of remaining teeth with alveoloplasty in the operating room with general anesthesia. The operating room procedure has been scheduled for this coming Monday, 09/19/2018 at 7:30 AM at [facility].  The patient was then follow up with the dentist of her choice for fabrication of upper and lower complete dentures after adequate healing.  2. Discussion of findings with medical team and coordination of future medical and dental care as needed.  I spent in excess of  120 minutes during the conduct of this consultation and >50% of this time involved direct face-to-face encounter for counseling and/or coordination of the patient's care.    [name], DDS

## 2018-09-14 NOTE — Addendum Note (Signed)
Addended by: [name] on: 09/14/2018 12:24 PM   Modules accepted: Orders, SmartSet

## 2018-09-14 NOTE — Patient Instructions (Signed)
Patient has been scheduled for operating room procedure Katelyn Cline on Monday, 09/19/2018 at 7:30 AM. The patient was reminded to be nothing by mouth after midnight on Monday morning. Presurgical testing will call her to let her know when she needs to arrive for the operating room procedure.  Dr. Enrique Sack

## 2018-09-15 NOTE — Patient Instructions (Signed)
Katelyn Cline  09/15/2018       Your procedure is scheduled on:   09-19-2018   Report to [facility] Main  Entrance,  Report to Short Stay  at   5:30 AM    Call this number if you have problems the morning of surgery [phone]         Remember: Do not eat food or drink liquids :After Midnight.   This includes no water, candy, gum, mints  BRUSH YOUR TEETH MORNING OF SURGERY AND RINSE YOUR MOUTH OUT          Take these medicines the morning of surgery with A SIP OF WATER:   Baclofen,  Duloxetine (cymbalta),  Gabapentin if needed  DO NOT TAKE ANY DIABETIC MEDICATIONS DAY OF YOUR SURGERY                                    You may not have any metal on your body including hair pins and piercings              Do not wear jewelry, make-up, lotions, powders or perfumes, deodorant              Do not wear nail polish.  Do not shave  48 hours prior to surgery.         Do not bring valuables to the hospital. [place].  Contacts, dentures or bridgework may not be worn into surgery.  Leave suitcase in the car. After surgery it may be brought to your room.         Patients discharged the day of surgery will not be allowed to drive home. IF YOU ARE HAVING SURGERY AND GOING HOME THE SAME DAY, YOU MUST HAVE AN ADULT TO DRIVE YOU HOME AND BE WITH YOU FOR 24 HOURS. YOU MAY GO HOME BY TAXI OR UBER OR ORTHERWISE, BUT AN ADULT MUST ACCOMPANY YOU HOME AND STAY WITH YOU FOR 24 HOURS.   Name and phone number of your driver:              _____________________________________________________________________             [facility] - Preparing for Surgery Before surgery, you can play an important role.  Because skin is not sterile, your skin needs to be as free of germs as possible.  You can reduce the number of germs on your skin by washing with CHG (chlorahexidine gluconate) soap before surgery.   CHG is an antiseptic cleaner which kills germs and bonds with the skin to continue killing germs even after washing. Please DO NOT use if you have an allergy to CHG or antibacterial soaps.  If your skin becomes reddened/irritated stop using the CHG and inform your nurse when you arrive at Short Stay. Do not shave (including legs and underarms) for at least 48 hours prior to the first CHG shower.  You may shave your face/neck. Please follow these instructions carefully:  1.  Shower with CHG Soap the night before surgery and the  morning of Surgery.  2.  If you choose to wash your hair, wash your hair first as usual with your  normal  shampoo.  3.  After you shampoo,  rinse your hair and body thoroughly to remove the  shampoo.                            4.  Use CHG as you would any other liquid soap.  You can apply chg directly  to the skin and wash                       Gently with a scrungie or clean washcloth.  5.  Apply the CHG Soap to your body ONLY FROM THE NECK DOWN.   Do not use on face/ open                           Wound or open sores. Avoid contact with eyes, ears mouth and genitals (private parts).                       Wash face,  Genitals (private parts) with your normal soap.             6.  Wash thoroughly, paying special attention to the area where your surgery  will be performed.  7.  Thoroughly rinse your body with warm water from the neck down.  8.  DO NOT shower/wash with your normal soap after using and rinsing off  the CHG Soap.             9.  Pat yourself dry with a clean towel.            10.  Wear clean pajamas.            11.  Place clean sheets on your bed the night of your first shower and do not  sleep with pets. Day of Surgery : Do not apply any lotions/deodorants the morning of surgery.  Please wear clean clothes to the hospital/surgery center.  FAILURE TO FOLLOW THESE INSTRUCTIONS MAY RESULT IN THE CANCELLATION OF YOUR SURGERY PATIENT  SIGNATURE_________________________________  NURSE SIGNATURE__________________________________  ________________________________________________________________________

## 2018-09-16 ENCOUNTER — Encounter (HOSPITAL_COMMUNITY): Payer: Self-pay

## 2018-09-16 ENCOUNTER — Encounter (HOSPITAL_COMMUNITY)
Admission: RE | Admit: 2018-09-16 | Discharge: 2018-09-16 | Disposition: A | Discharge status: Home / Self Care | Payer: Medicare HMO | Source: Ambulatory Visit | Attending: Dentistry | Admitting: Dentistry

## 2018-09-16 ENCOUNTER — Other Ambulatory Visit: Payer: Self-pay

## 2018-09-16 DIAGNOSIS — Z01818 Encounter for other preprocedural examination: Secondary | ICD-10-CM

## 2018-09-16 DIAGNOSIS — G4730 Sleep apnea, unspecified: Secondary | ICD-10-CM | POA: Diagnosis not present

## 2018-09-16 DIAGNOSIS — M419 Scoliosis, unspecified: Secondary | ICD-10-CM | POA: Insufficient documentation

## 2018-09-16 DIAGNOSIS — E1121 Type 2 diabetes mellitus with diabetic nephropathy: Secondary | ICD-10-CM | POA: Diagnosis not present

## 2018-09-16 DIAGNOSIS — I10 Essential (primary) hypertension: Secondary | ICD-10-CM | POA: Insufficient documentation

## 2018-09-16 DIAGNOSIS — F329 Major depressive disorder, single episode, unspecified: Secondary | ICD-10-CM | POA: Diagnosis not present

## 2018-09-16 DIAGNOSIS — K0401 Reversible pulpitis: Secondary | ICD-10-CM

## 2018-09-16 DIAGNOSIS — K029 Dental caries, unspecified: Secondary | ICD-10-CM | POA: Diagnosis not present

## 2018-09-16 DIAGNOSIS — D45 Polycythemia vera: Secondary | ICD-10-CM | POA: Diagnosis not present

## 2018-09-16 DIAGNOSIS — K219 Gastro-esophageal reflux disease without esophagitis: Secondary | ICD-10-CM | POA: Diagnosis not present

## 2018-09-16 DIAGNOSIS — M1990 Unspecified osteoarthritis, unspecified site: Secondary | ICD-10-CM

## 2018-09-16 DIAGNOSIS — G4733 Obstructive sleep apnea (adult) (pediatric): Secondary | ICD-10-CM

## 2018-09-16 DIAGNOSIS — M5137 Other intervertebral disc degeneration, lumbosacral region: Secondary | ICD-10-CM | POA: Insufficient documentation

## 2018-09-16 DIAGNOSIS — Z85828 Personal history of other malignant neoplasm of skin: Secondary | ICD-10-CM

## 2018-09-16 DIAGNOSIS — C7801 Secondary malignant neoplasm of right lung: Secondary | ICD-10-CM | POA: Diagnosis not present

## 2018-09-16 DIAGNOSIS — J9611 Chronic respiratory failure with hypoxia: Secondary | ICD-10-CM

## 2018-09-16 DIAGNOSIS — M797 Fibromyalgia: Secondary | ICD-10-CM | POA: Insufficient documentation

## 2018-09-16 DIAGNOSIS — K083 Retained dental root: Secondary | ICD-10-CM | POA: Diagnosis not present

## 2018-09-16 DIAGNOSIS — E1140 Type 2 diabetes mellitus with diabetic neuropathy, unspecified: Secondary | ICD-10-CM | POA: Insufficient documentation

## 2018-09-16 DIAGNOSIS — Z7984 Long term (current) use of oral hypoglycemic drugs: Secondary | ICD-10-CM

## 2018-09-16 DIAGNOSIS — F17210 Nicotine dependence, cigarettes, uncomplicated: Secondary | ICD-10-CM | POA: Diagnosis not present

## 2018-09-16 DIAGNOSIS — Z79899 Other long term (current) drug therapy: Secondary | ICD-10-CM

## 2018-09-16 DIAGNOSIS — G8929 Other chronic pain: Secondary | ICD-10-CM | POA: Diagnosis not present

## 2018-09-16 DIAGNOSIS — J449 Chronic obstructive pulmonary disease, unspecified: Secondary | ICD-10-CM

## 2018-09-16 DIAGNOSIS — K0530 Chronic periodontitis, unspecified: Secondary | ICD-10-CM | POA: Diagnosis not present

## 2018-09-16 DIAGNOSIS — Z7982 Long term (current) use of aspirin: Secondary | ICD-10-CM | POA: Insufficient documentation

## 2018-09-16 DIAGNOSIS — E785 Hyperlipidemia, unspecified: Secondary | ICD-10-CM

## 2018-09-16 DIAGNOSIS — K045 Chronic apical periodontitis: Secondary | ICD-10-CM | POA: Diagnosis not present

## 2018-09-16 HISTORY — DX: Dental caries, unspecified: K02.9

## 2018-09-16 HISTORY — DX: Other specified postprocedural states: Z98.890

## 2018-09-16 HISTORY — DX: Chronic periodontitis, unspecified: K05.30

## 2018-09-16 HISTORY — DX: Chronic myeloproliferative disease: D47.1

## 2018-09-16 HISTORY — DX: Personal history of other specified conditions: Z87.898

## 2018-09-16 HISTORY — DX: Personal history of other malignant neoplasm of skin: Z85.828

## 2018-09-16 HISTORY — DX: Low back pain, unspecified: M54.50

## 2018-09-16 HISTORY — DX: Other nonspecific abnormal finding of lung field: R91.8

## 2018-09-16 HISTORY — DX: Mixed incontinence: N39.46

## 2018-09-16 HISTORY — DX: Presence of spectacles and contact lenses: Z97.3

## 2018-09-16 HISTORY — DX: Simple chronic bronchitis: J41.0

## 2018-09-16 HISTORY — DX: Chronic obstructive pulmonary disease, unspecified: J44.9

## 2018-09-16 HISTORY — DX: Other intervertebral disc degeneration, lumbosacral region: M51.37

## 2018-09-16 HISTORY — DX: Other chronic pain: G89.29

## 2018-09-16 HISTORY — DX: Unspecified osteoarthritis, unspecified site: M19.90

## 2018-09-16 HISTORY — DX: Low back pain: M54.5

## 2018-09-16 HISTORY — DX: Obstructive sleep apnea (adult) (pediatric): G47.33

## 2018-09-16 HISTORY — DX: Other specified disorders of teeth and supporting structures: K08.89

## 2018-09-16 HISTORY — DX: Dependence on supplemental oxygen: Z99.81

## 2018-09-16 HISTORY — DX: Other intervertebral disc degeneration, lumbosacral region without mention of lumbar back pain or lower extremity pain: M51.379

## 2018-09-16 HISTORY — DX: Gastro-esophageal reflux disease without esophagitis: K21.9

## 2018-09-16 LAB — GLUCOSE, CAPILLARY: Glucose-Capillary: 160 mg/dL — ABNORMAL HIGH (ref 70–99)

## 2018-09-16 NOTE — Anesthesia Preprocedure Evaluation (Addendum)
Anesthesia Evaluation  Patient identified by MRN, date of birth, ID band Patient awake    Reviewed: Allergy & Precautions, NPO status , Patient's Chart, lab work & pertinent test results  Airway Mallampati: III  TM Distance: >3 FB Neck ROM: Full    Dental  (+) Dental Advisory Given, Missing   Pulmonary sleep apnea , COPD, Current Smoker,    breath sounds clear to auscultation + decreased breath sounds      Cardiovascular hypertension, negative cardio ROS Normal cardiovascular exam Rhythm:Regular Rate:Normal     Neuro/Psych PSYCHIATRIC DISORDERS Anxiety Depression negative neurological ROS     GI/Hepatic Neg liver ROS, GERD  ,  Endo/Other  diabetes, Type 2  Renal/GU negative Renal ROS     Musculoskeletal  (+) Arthritis , Fibromyalgia -  Abdominal (+) + obese,   Peds  Hematology negative hematology ROS (+)   Anesthesia Other Findings Day of surgery medications reviewed with the patient.  Reproductive/Obstetrics                           Anesthesia Physical Anesthesia Plan  ASA: III  Anesthesia Plan: General   Post-op Pain Management:    Induction: Intravenous  PONV Risk Score and Plan: 3 and Ondansetron and Dexamethasone  Airway Management Planned: Video Laryngoscope Planned and Nasal ETT  Additional Equipment: None  Intra-op Plan:   Post-operative Plan: Extubation in OR  Informed Consent: I have reviewed the patients History and Physical, chart, labs and discussed the procedure including the risks, benefits and alternatives for the proposed anesthesia with the patient or authorized representative who has indicated his/her understanding and acceptance.     Dental advisory given  Plan Discussed with: CRNA  Anesthesia Plan Comments: (See PAT note 09/16/18, Konrad Felix, PA-C)      Anesthesia Quick Evaluation

## 2018-09-16 NOTE — Progress Notes (Signed)
EKG dated 03-11-2018 in epic.  Chest CT dated 03-11-2018 in epic  A1c result dated 07-18-2018 in epic.  CBCdiff and CMP results dated 09-07-2018 in epic.  Pt had anesthesia consult with Konrad Felix PA at PAT appointment today.

## 2018-09-16 NOTE — Progress Notes (Signed)
Anesthesia Chart Review   Case:  [id number] Date/Time:  09/19/18 0715   Procedure:  MULTIPLE EXTRACTION WITH ALVEOLOPLASTY (N/A ) - GENERAL WITH NASAL TUBE   Anesthesia type:  General   Pre-op diagnosis:  MYLOPROLIFERATIVE NEOPLASM, ACUTE PULPITIS   Location:  [facility]   Surgeon:  [name], DDS      DISCUSSION: 58 yo current every day smoker (50 pack years) with h/o HLD, DM II, HTN, depression, panic attacks, agoraphobia, fibromyalgia, COPD (uses portable 4L O2 prn during the day, continuous at night), OSA, GERD,polycythemia vera, myeloproliferative neoplasm, acute pulpitis scheduled for above procedure 09/19/18 with Dr. Teena Dunk.   Pt seen by medical oncology, Dr. Sullivan Lone, 09/07/18 for evaluation and management of leukocytosis.  Getting phlebotomy and on Hydroxyurea. Stable at this visit.  Referred to dentist for recurrent dental abscess.  Scheduled for above procedure.   Pt can proceed with planned procedure barring acute status change.  VS: BP 119/60 (BP Location: Right Arm)   Pulse 86   Temp 36.8 C (Oral)   Resp 18   Ht 5\' 8"  (1.727 m)   Wt 99.8 kg   SpO2 95%   BMI 33.45 kg/m   PROVIDERS: [name], MD is PCP last seen 07/18/18  Sullivan Lone, MD with Medical oncology LABS: Labs reviewed: Acceptable for surgery. (all labs ordered are listed, but only abnormal results are displayed)  Labs Reviewed  GLUCOSE, CAPILLARY - Abnormal; Notable for the following components:      Result Value   Glucose-Capillary 160 (*)    All other components within normal limits     IMAGES:   EKG: 03/11/18 Rate 61 bpm Sinus rhythm Anterior infarct, old Baseline wander in leads II, III, aVF  CV:  Past Medical History:  Diagnosis Date  . Agoraphobia   . Arthritis    "all joints"  . Chronic low back pain   . Chronic respiratory failure with hypoxia ([name])    4L  via [place],  followed by pcp,   (09-16-2018  per pt only uses while at home and at night ,   portable oxygen is not working)  . COPD (chronic obstructive pulmonary disease) ([place])   . DDD (degenerative disc disease), lumbosacral   . Dental caries   . DM type 2 (diabetes mellitus, type 2) ([place])    followed by pcp  . Fibromyalgia   . GERD (gastroesophageal reflux disease)    occasional , no meds  . History of basal cell carcinoma (BCC) excision    s/p  Moh's of face/ nose 12/ 2013  . History of palpitations    per pt found to have occaional PVCs  . History of UTI   . HTN (hypertension)   . Hyperlipidemia   . Loose, teeth   . Major depression   . Metastatic melanoma ([name]) 10/18/2012   12 mm posterior right upper lobe pulmonary nodule, max SUV 3.0  s/p  right wedge resection 10-21-2012,  followed by oncologist-- dr Irene Limbo,  no recurrence  . Mixed stress and urge urinary incontinence   . Myeloproliferative neoplasm ([place])   . Narcolepsy   . OSA (obstructive sleep apnea)   . Oxygen dependent    4 L via [place],  per pt portable oxygen not working but does use while at home and at night  . Panic attacks   . Periodontitis   . Peripheral neuropathy    feet  . 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 Pulmonary nodules    bilateral small stable per CT 03-11-2018  . Scoliosis   . Smokers' cough ([place])    per pt productive a little in the morning's  . Wears glasses     Past Surgical History:  Procedure Laterality Date  . ABDOMINAL HYSTERECTOMY  1998  . BREAST LUMPECTOMY Bilateral 1995   benign per pt  . COLONOSCOPY W/ BIOPSIES AND POLYPECTOMY     Hx: of  . CYSTECTOMY  2000   abdominal wall   . DILATION AND CURETTAGE OF UTERUS  yrs ago  . MOHS SURGERY  2013   nose/face  . VIDEO ASSISTED THORACOSCOPY (VATS)/WEDGE RESECTION Right 10/21/2012   Procedure: VIDEO ASSISTED THORACOSCOPY (VATS)/WEDGE RESECTION;  Surgeon: [name], MD;  Location: [facility];  Service: Thoracic;  Laterality: Right;  Marland Kitchen VIDEO BRONCHOSCOPY N/A 10/21/2012   Procedure: VIDEO BRONCHOSCOPY;  Surgeon: [name], MD;  Location: [facility] OR;  Service: Thoracic;  Laterality: N/A;    MEDICATIONS: . amLODipine (NORVASC) 5 MG tablet  . aspirin EC 81 MG tablet  . atorvastatin (LIPITOR) 80 MG tablet  . baclofen (LIORESAL) 10 MG tablet  . Carboxymethylcellul-Glycerin (LUBRICATING EYE DROPS OP)  . chlorhexidine (PERIDEX) 0.12 % solution  . DULoxetine (CYMBALTA) 60 MG capsule  . empagliflozin (JARDIANCE) 25 MG TABS tablet  . fenofibrate (TRICOR) 48 MG tablet  . fish oil-omega-3 fatty acids 1000 MG capsule  . fluconazole (DIFLUCAN) 150 MG tablet  . gabapentin (NEURONTIN) 100 MG capsule  . Glucose Blood (BLOOD GLUCOSE TEST STRIPS) STRP  . hydrochlorothiazide (HYDRODIURIL) 25 MG tablet  . hydroxyurea (HYDREA) 500 MG capsule  . lisinopril (ZESTRIL) 2.5 MG tablet  . metFORMIN (GLUCOPHAGE) 1000 MG tablet  . MICROLET LANCETS MISC  . oxyCODONE-acetaminophen (PERCOCET) 7.5-325 MG tablet  . Pseudoeph-Doxylamine-DM-APAP (NYQUIL PO)  . senna (SENOKOT) 8.6 MG TABS tablet  . valACYclovir (VALTREX) 1000 MG tablet  . varenicline (CHANTIX STARTING MONTH PAK) 0.5 MG X 11 & 1 MG X 42 tablet   No current facility-administered medications for this encounter.    Maia Plan [facility] Pre-Surgical Testing [phone] 09/16/18 2:33 PM

## 2018-09-19 ENCOUNTER — Ambulatory Visit (HOSPITAL_COMMUNITY): Payer: Medicare HMO | Admitting: Anesthesiology

## 2018-09-19 ENCOUNTER — Encounter (HOSPITAL_COMMUNITY): Payer: Self-pay | Admitting: *Deleted

## 2018-09-19 ENCOUNTER — Other Ambulatory Visit: Payer: Self-pay

## 2018-09-19 ENCOUNTER — Ambulatory Visit (HOSPITAL_COMMUNITY): Payer: Medicare HMO | Admitting: Physician Assistant

## 2018-09-19 ENCOUNTER — Ambulatory Visit (HOSPITAL_COMMUNITY)
Admission: RE | Admit: 2018-09-19 | Discharge: 2018-09-19 | Disposition: A | Discharge status: Home / Self Care | Payer: Medicare HMO | Attending: Dentistry | Admitting: Dentistry

## 2018-09-19 ENCOUNTER — Encounter (HOSPITAL_COMMUNITY)
Admission: RE | Disposition: A | Discharge status: Home / Self Care | Payer: Self-pay | Source: Home / Self Care | Attending: Dentistry

## 2018-09-19 DIAGNOSIS — K083 Retained dental root: Secondary | ICD-10-CM | POA: Diagnosis not present

## 2018-09-19 DIAGNOSIS — K0530 Chronic periodontitis, unspecified: Secondary | ICD-10-CM | POA: Insufficient documentation

## 2018-09-19 DIAGNOSIS — K045 Chronic apical periodontitis: Secondary | ICD-10-CM | POA: Diagnosis not present

## 2018-09-19 DIAGNOSIS — J449 Chronic obstructive pulmonary disease, unspecified: Secondary | ICD-10-CM | POA: Insufficient documentation

## 2018-09-19 DIAGNOSIS — Z85828 Personal history of other malignant neoplasm of skin: Secondary | ICD-10-CM | POA: Insufficient documentation

## 2018-09-19 DIAGNOSIS — E785 Hyperlipidemia, unspecified: Secondary | ICD-10-CM | POA: Insufficient documentation

## 2018-09-19 DIAGNOSIS — G8929 Other chronic pain: Secondary | ICD-10-CM | POA: Insufficient documentation

## 2018-09-19 DIAGNOSIS — Z79899 Other long term (current) drug therapy: Secondary | ICD-10-CM | POA: Insufficient documentation

## 2018-09-19 DIAGNOSIS — I10 Essential (primary) hypertension: Secondary | ICD-10-CM | POA: Insufficient documentation

## 2018-09-19 DIAGNOSIS — F17210 Nicotine dependence, cigarettes, uncomplicated: Secondary | ICD-10-CM | POA: Insufficient documentation

## 2018-09-19 DIAGNOSIS — D45 Polycythemia vera: Secondary | ICD-10-CM | POA: Insufficient documentation

## 2018-09-19 DIAGNOSIS — G4730 Sleep apnea, unspecified: Secondary | ICD-10-CM | POA: Diagnosis not present

## 2018-09-19 DIAGNOSIS — K029 Dental caries, unspecified: Secondary | ICD-10-CM | POA: Insufficient documentation

## 2018-09-19 DIAGNOSIS — C7801 Secondary malignant neoplasm of right lung: Secondary | ICD-10-CM | POA: Insufficient documentation

## 2018-09-19 DIAGNOSIS — Z7982 Long term (current) use of aspirin: Secondary | ICD-10-CM | POA: Insufficient documentation

## 2018-09-19 DIAGNOSIS — K0402 Irreversible pulpitis: Secondary | ICD-10-CM

## 2018-09-19 DIAGNOSIS — K0401 Reversible pulpitis: Secondary | ICD-10-CM | POA: Diagnosis not present

## 2018-09-19 DIAGNOSIS — M797 Fibromyalgia: Secondary | ICD-10-CM | POA: Diagnosis not present

## 2018-09-19 DIAGNOSIS — F329 Major depressive disorder, single episode, unspecified: Secondary | ICD-10-CM | POA: Insufficient documentation

## 2018-09-19 DIAGNOSIS — K219 Gastro-esophageal reflux disease without esophagitis: Secondary | ICD-10-CM | POA: Insufficient documentation

## 2018-09-19 DIAGNOSIS — K0889 Other specified disorders of teeth and supporting structures: Secondary | ICD-10-CM

## 2018-09-19 DIAGNOSIS — E1121 Type 2 diabetes mellitus with diabetic nephropathy: Secondary | ICD-10-CM | POA: Insufficient documentation

## 2018-09-19 DIAGNOSIS — Z7984 Long term (current) use of oral hypoglycemic drugs: Secondary | ICD-10-CM | POA: Insufficient documentation

## 2018-09-19 HISTORY — PX: MULTIPLE EXTRACTIONS WITH ALVEOLOPLASTY: SHX5342

## 2018-09-19 LAB — GLUCOSE, CAPILLARY
Glucose-Capillary: 193 mg/dL — ABNORMAL HIGH (ref 70–99)
Glucose-Capillary: 212 mg/dL — ABNORMAL HIGH (ref 70–99)
Glucose-Capillary: 212 mg/dL — ABNORMAL HIGH (ref 70–99)

## 2018-09-19 SURGERY — MULTIPLE EXTRACTION WITH ALVEOLOPLASTY
Anesthesia: General

## 2018-09-19 MED ORDER — GLYCOPYRROLATE PF 0.2 MG/ML IJ SOSY
PREFILLED_SYRINGE | INTRAMUSCULAR | Status: AC
  Filled 2018-09-19: qty 1

## 2018-09-19 MED ORDER — LIDOCAINE-EPINEPHRINE 2 %-1:100000 IJ SOLN
INTRAMUSCULAR | Status: AC
  Filled 2018-09-19: qty 10.2

## 2018-09-19 MED ORDER — EPHEDRINE SULFATE-NACL 50-0.9 MG/10ML-% IV SOSY
PREFILLED_SYRINGE | INTRAVENOUS | Status: DC | PRN
  Administered 2018-09-19 (×6): 10 mg via INTRAVENOUS

## 2018-09-19 MED ORDER — ISOPROPYL ALCOHOL 70 % SOLN
Status: AC
  Filled 2018-09-19: qty 480

## 2018-09-19 MED ORDER — ISOPROPYL ALCOHOL 70 % SOLN
Status: DC | PRN
  Administered 2018-09-19: 1 via TOPICAL

## 2018-09-19 MED ORDER — SUGAMMADEX SODIUM 200 MG/2ML IV SOLN
INTRAVENOUS | Status: DC | PRN
  Administered 2018-09-19: 200 mg via INTRAVENOUS

## 2018-09-19 MED ORDER — OXYMETAZOLINE HCL 0.05 % NA SOLN
NASAL | Status: DC | PRN
  Administered 2018-09-19 (×2): 1 via NASAL

## 2018-09-19 MED ORDER — PHENYLEPHRINE 40 MCG/ML (10ML) SYRINGE FOR IV PUSH (FOR BLOOD PRESSURE SUPPORT)
PREFILLED_SYRINGE | INTRAVENOUS | Status: DC | PRN
  Administered 2018-09-19: 80 ug via INTRAVENOUS

## 2018-09-19 MED ORDER — BUPIVACAINE-EPINEPHRINE (PF) 0.5% -1:200000 IJ SOLN
INTRAMUSCULAR | Status: AC
  Filled 2018-09-19: qty 3.6

## 2018-09-19 MED ORDER — EPHEDRINE 5 MG/ML INJ
INTRAVENOUS | Status: AC
  Filled 2018-09-19: qty 10

## 2018-09-19 MED ORDER — SUGAMMADEX SODIUM 200 MG/2ML IV SOLN
INTRAVENOUS | Status: AC
  Filled 2018-09-19: qty 2

## 2018-09-19 MED ORDER — PROPOFOL 10 MG/ML IV BOLUS
INTRAVENOUS | Status: DC | PRN
  Administered 2018-09-19: 150 mg via INTRAVENOUS

## 2018-09-19 MED ORDER — PHENYLEPHRINE 40 MCG/ML (10ML) SYRINGE FOR IV PUSH (FOR BLOOD PRESSURE SUPPORT)
PREFILLED_SYRINGE | INTRAVENOUS | Status: AC
  Filled 2018-09-19: qty 10

## 2018-09-19 MED ORDER — SUCCINYLCHOLINE CHLORIDE 200 MG/10ML IV SOSY
PREFILLED_SYRINGE | INTRAVENOUS | Status: AC
  Filled 2018-09-19: qty 10

## 2018-09-19 MED ORDER — LACTATED RINGERS IV SOLN: INTRAVENOUS | Status: DC

## 2018-09-19 MED ORDER — BUPIVACAINE-EPINEPHRINE (PF) 0.5% -1:200000 IJ SOLN
INTRAMUSCULAR | Status: DC | PRN
  Administered 2018-09-19: 20 mL via PERINEURAL

## 2018-09-19 MED ORDER — FENTANYL CITRATE (PF) 250 MCG/5ML IJ SOLN
INTRAMUSCULAR | Status: AC
  Filled 2018-09-19: qty 5

## 2018-09-19 MED ORDER — LIDOCAINE-EPINEPHRINE 2 %-1:100000 IJ SOLN
INTRAMUSCULAR | Status: DC | PRN
  Administered 2018-09-19: 10 mL via INTRADERMAL

## 2018-09-19 MED ORDER — FENTANYL CITRATE (PF) 100 MCG/2ML IJ SOLN
INTRAMUSCULAR | Status: DC | PRN
  Administered 2018-09-19: 50 ug via INTRAVENOUS
  Administered 2018-09-19: 100 ug via INTRAVENOUS

## 2018-09-19 MED ORDER — LIDOCAINE 2% (20 MG/ML) 5 ML SYRINGE
INTRAMUSCULAR | Status: AC
  Filled 2018-09-19: qty 5

## 2018-09-19 MED ORDER — PROPOFOL 10 MG/ML IV BOLUS
INTRAVENOUS | Status: AC
  Filled 2018-09-19: qty 20

## 2018-09-19 MED ORDER — ROCURONIUM BROMIDE 50 MG/5ML IV SOSY
PREFILLED_SYRINGE | INTRAVENOUS | Status: DC | PRN
  Administered 2018-09-19: 40 mg via INTRAVENOUS
  Administered 2018-09-19: 10 mg via INTRAVENOUS

## 2018-09-19 MED ORDER — ONDANSETRON HCL 4 MG/2ML IJ SOLN
INTRAMUSCULAR | Status: AC
  Filled 2018-09-19: qty 2

## 2018-09-19 MED ORDER — SODIUM CHLORIDE 0.9 % IV SOLN
INTRAVENOUS | Status: DC | PRN
  Administered 2018-09-19: 35 ug/min via INTRAVENOUS

## 2018-09-19 MED ORDER — DEXAMETHASONE SODIUM PHOSPHATE 10 MG/ML IJ SOLN
INTRAMUSCULAR | Status: DC | PRN
  Administered 2018-09-19: 10 mg via INTRAVENOUS

## 2018-09-19 MED ORDER — MIDAZOLAM HCL 5 MG/5ML IJ SOLN
INTRAMUSCULAR | Status: DC | PRN
  Administered 2018-09-19: 1 mg via INTRAVENOUS

## 2018-09-19 MED ORDER — GLYCOPYRROLATE 0.2 MG/ML IJ SOLN
INTRAMUSCULAR | Status: DC | PRN
  Administered 2018-09-19: 0.2 mg via INTRAVENOUS

## 2018-09-19 MED ORDER — SUCCINYLCHOLINE CHLORIDE 200 MG/10ML IV SOSY
PREFILLED_SYRINGE | INTRAVENOUS | Status: DC | PRN
  Administered 2018-09-19: 100 mg via INTRAVENOUS

## 2018-09-19 MED ORDER — LIDOCAINE 2% (20 MG/ML) 5 ML SYRINGE
INTRAMUSCULAR | Status: DC | PRN
  Administered 2018-09-19: 100 mg via INTRAVENOUS

## 2018-09-19 MED ORDER — ONDANSETRON HCL 4 MG/2ML IJ SOLN
INTRAMUSCULAR | Status: DC | PRN
  Administered 2018-09-19: 4 mg via INTRAVENOUS

## 2018-09-19 MED ORDER — OXYCODONE-ACETAMINOPHEN 5-325 MG PO TABS
1.0000 | ORAL_TABLET | ORAL | Status: DC | PRN
  Administered 2018-09-19: 1 via ORAL

## 2018-09-19 MED ORDER — PHENYLEPHRINE HCL 10 MG/ML IJ SOLN
INTRAMUSCULAR | Status: AC
  Filled 2018-09-19: qty 1

## 2018-09-19 MED ORDER — CLINDAMYCIN PHOSPHATE 600 MG/50ML IV SOLN
600.0000 mg | Freq: Once | INTRAVENOUS | Status: AC
  Administered 2018-09-19: 600 mg via INTRAVENOUS
  Filled 2018-09-19: qty 50

## 2018-09-19 MED ORDER — MIDAZOLAM HCL 2 MG/2ML IJ SOLN
INTRAMUSCULAR | Status: AC
  Filled 2018-09-19: qty 2

## 2018-09-19 MED ORDER — LIDOCAINE HCL URETHRAL/MUCOSAL 2 % EX GEL
CUTANEOUS | Status: AC
  Filled 2018-09-19: qty 5

## 2018-09-19 MED ORDER — OXYCODONE-ACETAMINOPHEN 5-325 MG PO TABS
ORAL_TABLET | ORAL | Status: AC
  Filled 2018-09-19: qty 1

## 2018-09-19 MED ORDER — LACTATED RINGERS IV SOLN
INTRAVENOUS | Status: DC
  Administered 2018-09-19 (×2): via INTRAVENOUS

## 2018-09-19 MED ORDER — OXYMETAZOLINE HCL 0.05 % NA SOLN
NASAL | Status: AC
  Filled 2018-09-19: qty 30

## 2018-09-19 MED ORDER — ROCURONIUM BROMIDE 10 MG/ML (PF) SYRINGE
PREFILLED_SYRINGE | INTRAVENOUS | Status: AC
  Filled 2018-09-19: qty 10

## 2018-09-19 MED ORDER — DEXAMETHASONE SODIUM PHOSPHATE 10 MG/ML IJ SOLN
INTRAMUSCULAR | Status: AC
  Filled 2018-09-19: qty 1

## 2018-09-19 SURGICAL SUPPLY — 33 items
ATTRACTOMAT 16X20 MAGNETIC DRP (DRAPES) ×2 IMPLANT
BAG ZIPLOCK 12X15 (MISCELLANEOUS) IMPLANT
BLADE SURG 15 STRL LF DISP TIS (BLADE) ×2 IMPLANT
BLADE SURG 15 STRL SS (BLADE) ×2
BNDG EYE OVAL (GAUZE/BANDAGES/DRESSINGS) ×4 IMPLANT
CANNULA VESSEL W/WING WO/VALVE (CANNULA) ×2 IMPLANT
COVER SURGICAL LIGHT HANDLE (MISCELLANEOUS) ×2 IMPLANT
COVER WAND RF STERILE (DRAPES) IMPLANT
GAUZE 4X4 16PLY RFD (DISPOSABLE) ×2 IMPLANT
GAUZE SPONGE 4X4 12PLY STRL (GAUZE/BANDAGES/DRESSINGS) ×2 IMPLANT
GLOVE BIOGEL PI IND STRL 6 (GLOVE) ×1 IMPLANT
GLOVE BIOGEL PI INDICATOR 6 (GLOVE) ×1
GLOVE SURG ORTHO 8.0 STRL STRW (GLOVE) ×2 IMPLANT
GLOVE SURG SS PI 6.0 STRL IVOR (GLOVE) ×2 IMPLANT
GOWN STRL REUS W/TWL 2XL LVL3 (GOWN DISPOSABLE) ×2 IMPLANT
GOWN STRL REUS W/TWL LRG LVL3 (GOWN DISPOSABLE) ×2 IMPLANT
KIT BASIN OR (CUSTOM PROCEDURE TRAY) ×2 IMPLANT
KIT TURNOVER KIT A (KITS) IMPLANT
NEEDLE DENTAL RB 25GX1.25 (NEEDLE) IMPLANT
NS IRRIG 1000ML POUR BTL (IV SOLUTION) ×2 IMPLANT
PACK EENT SPLIT (PACKS) ×2 IMPLANT
PACKING VAGINAL (PACKING) ×2 IMPLANT
SPONGE SURGIFOAM ABS GEL 12-7 (HEMOSTASIS) IMPLANT
SUCTION FRAZIER HANDLE 12FR (TUBING)
SUCTION TUBE FRAZIER 12FR DISP (TUBING) IMPLANT
SUT CHROMIC 3 0 PS 2 (SUTURE) ×8 IMPLANT
SUT CHROMIC 4 0 P 3 18 (SUTURE) ×2 IMPLANT
SYR 50ML LL SCALE MARK (SYRINGE) ×2 IMPLANT
TOWEL OR 17X26 10 PK STRL BLUE (TOWEL DISPOSABLE) ×2 IMPLANT
TUBING CONNECTING 10 (TUBING) ×2 IMPLANT
WATER STERILE IRR 1000ML POUR (IV SOLUTION) ×2 IMPLANT
WATER TABLETS ICX (MISCELLANEOUS) ×2 IMPLANT
YANKAUER SUCT BULB TIP NO VENT (SUCTIONS) ×2 IMPLANT

## 2018-09-19 NOTE — Anesthesia Postprocedure Evaluation (Signed)
Anesthesia Post Note  Patient: Katelyn Cline  Procedure(s) Performed: Extraction of tooth #'s 2, 3, 6-14, 18, and 20-30 with alveoloplasty (N/A )     Patient location during evaluation: PACU Anesthesia Type: General Level of consciousness: sedated and patient cooperative Pain management: pain level controlled Vital Signs Assessment: post-procedure vital signs reviewed and stable Respiratory status: spontaneous breathing Cardiovascular status: stable Anesthetic complications: no    Last Vitals:  Vitals:   09/19/18 1120 09/19/18 1125  BP: (!) 145/62   Pulse: 91   Resp: 16   Temp: 36.6 C   SpO2:  90%    Last Pain:  Vitals:   09/19/18 1100  TempSrc:   PainSc: [name]

## 2018-09-19 NOTE — Op Note (Signed)
OPERATIVE REPORT  Patient:            Katelyn Cline Date of Birth:  [birth year] MRN:                [id number]   DATE OF PROCEDURE:  09/19/2018  PREOPERATIVE DIAGNOSES: 1.  Myeloproliferative neoplasm 2.  Polycythemia vera 3.  Acute pulpitis 4.  History of dental abscesses 5.  Chronic apical periodontitis 6.  Dental caries 7.  Retained root segments 8.  Chronic periodontitis 9.  Loose teeth  POSTOPERATIVE DIAGNOSES: 1.  Myeloproliferative neoplasm 2.  Polycythemia vera 3.  Acute pulpitis 4.  History of dental abscesses 5.  Chronic apical periodontitis 6.  Dental caries 7.  Retained root segments 8.  Chronic periodontitis 9.  Loose teeth  OPERATIONS: 1. Multiple extraction of tooth numbers 2, 3, 6 - 14, 18, and 20-31 2. 4 Quadrants of alveoloplasty   SURGEON: [name], DDS  ASSISTANT: Molli Posey (dental assistant)  ANESTHESIA: General anesthesia via nasoendotracheal tube.  MEDICATIONS: 1.  Clindamycin 600 mg IV prior to invasive dental procedures. 2. Local anesthesia with a total utilization of 6 carpules each containing 34 mg of lidocaine with 0.017 mg of epinephrine as well as 2 carpules each containing 9 mg of bupivacaine with 0.009 mg of epinephrine.  SPECIMENS: There are 24 teeth that were discarded.  DRAINS: None  CULTURES: None  COMPLICATIONS: None  ESTIMATED BLOOD LOSS: 100 mLs.  INTRAVENOUS FLUIDS: 900 mLs of Lactated ringers solution.  INDICATIONS: The patient was previously diagnosed with myeloproliferative neoplasm and polycythemia vera.  A medically necessary dental consultation was then requested to evaluate poor dentition, acute dental pain, and history of dental abscesses.  The patient was examined and treatment planned for extraction of remaining teeth with alveoloplasty in the operating room with general anesthesia.  This treatment plan was formulated to decrease the risks and complications associated with dental  infection from affecting the patient's systemic health.  OPERATIVE FINDINGS: Patient was examined operating room number 7.  The teeth were identified for extraction. The patient was noted be affected by pulpitis, history of dental abscesses, chronic apical periodontitis, multiple retained root segments, dental caries, chronic periodontitis, and loose teeth.     DESCRIPTION OF PROCEDURE: Patient was brought to the main operating room number 7. Patient was then placed in the supine position on the operating table. General anesthesia was then induced per the anesthesia team. The patient was then prepped and draped in the usual manner for dental medicine procedure. A timeout was performed. The patient was identified and procedures were verified. A throat pack was placed at this time. The oral cavity was then thoroughly examined with the findings noted above. The patient was then ready for dental medicine procedure as follows:  Local anesthesia was then administered sequentially with a total utilization of 6 carpules each containing 34 mg of lidocaine with 0.017 mg of epinephrine as well as 2 carpules  each containing 9 mg bupivacaine with 0.009 mg of epinephrine.  The Maxillary left and right quadrants first approached. Anesthesia was then delivered utilizing infiltration with lidocaine with epinephrine. A #15 blade incision was then made from the maxillary right tuberosity and extended to the maxillary left tuberosity.  A  surgical flap was then carefully reflected. The teeth were then subluxated with a series of straight elevators. Tooth number 2 was then removed with a 53R forceps without complications.  Tooth numbers 3, 6, 7, 8, 9, 10, 11, 12, 13, 14 were  then removed with a 150 forceps without complications. Alveoloplasty was then performed utilizing a ronguers and bone file to help achieve primary closure. The surgical site was then irrigated with copious amounts of sterile saline. The tissues were  approximated and trimmed appropriately. The maxillary right surgical site was then closed from the maxillary tuberosity and extended to the mesial #8 utilizing 3-0 Chromic Gut suture in a continuous interrupted suture technique x1.  The maxillary left surgical site was then closed from the maxillary left tuberosity and extended to the mesial #9 utilizing 3-0 Chromic Gut suture in a continuous interrupted suture technique x1.  The surgical site was then closed with 3 separate 4-0 Chromic Gut interrupted sutures in the area of tooth #11 to further close the surgical site.  At this point time, the mandibular quadrants were approached. The patient was given bilateral inferior alveolar nerve blocks and long buccal nerve blocks utilizing the bupivacaine with epinephrine. Further infiltration was then achieved utilizing the lidocaine with epinephrine. A 15 blade incision was then made from the distal of number 17 extended to the distal #32.  A surgical flap was then carefully reflected.  The lower teeth were then subluxated with a series straight elevators.  A surgical handpiece and bur and copious amounts of sterile water were then used to remove buccal and interseptal bone around tooth numbers 18, 20, and 29.  The teeth were then re-subluxated with a series of straight elevators.  Tooth numbers 18, and 20 - 31 were then removed with a 151 forceps without complications.  Alveoloplasty was then performed utilizing a rongeurs and bone file to help achieve primary closure. The tissues were approximated and trimmed appropriately. The surgical sites were then irrigated with copious amounts of sterile saline. The mandibular left surgical site was then closed from the distal of #17 extended to the mesial of #24 utilizing 3-0 Chromic Gut suture in a continuous interrupted suture technique x1.  The mandibular right surgical site was then closed from the distal #32 and extended the mesial #25 utilizing 3-0 Chromic Gut suture in a  continuous erupted suture technique x1.  The mandibular anterior area was then further closed with 2 interrupted 4-0 Chromic Gut sutures.  At this point in time, the entire mouth was irrigated with copious amounts of sterile saline. The patient was examined for complications, seeing none, the dental medicine procedure was deemed to be complete. The throat pack was removed at this time. An oral airway was then placed at the request of the anesthesia team. A series of 4 x 4 gauze were placed in the mouth to aid hemostasis. The patient was then handed over to the anesthesia team for final disposition. After an appropriate amount of time, the patient was extubated and taken to the postanesthsia care unit in good condition. All counts were correct for the dental medicine procedure.   [name], DDS.

## 2018-09-19 NOTE — H&P (Signed)
09/19/2018  Patient:            Katelyn Cline Date of Birth:  1960/12/23 MRN:                [id number]   BP (!) 125/56   Pulse 72   Temp 98.1 F (36.7 C) (Oral)   Resp 16   Ht _0  (1.727 m)   Wt 99.8 kg   SpO2 91%   BMI 33.45 kg/m    Katelyn Cline is a 58 year old female that presents for multiple dental extractions with alveoloplasty in the operating room with general anesthesia. Patient denies any acute medical or dental changes. Patient agrees to proceed with treatment plan above and is aware the potential risk for respiratory complications associated with coronavirus. Please see note from [name] dated 09/07/2018 to act as H&P for the dental operating room procedure.   Dr. Enrique Sack   HEMATOLOGY/ONCOLOGY CLINIC NOTE  Date of Service: 09/07/2018  Patient Care Team: [name], MD as PCP - General (Family Medicine) Fanny Bien, MD as Attending Physician (Family Medicine) Everardo All, MD (Hematology and Oncology) [name], MD as Attending Physician (Cardiothoracic Surgery)  CHIEF COMPLAINTS/PURPOSE OF CONSULTATION:  Continue mx of Jak2 mutation positive MPN  HISTORY OF PRESENTING ILLNESS:   Katelyn Cline is a wonderful 58 y.o. female who has been referred to [place] by Dr. Tana Coast  for evaluation and management of Leukocytosis. The pt reports that she is doing well overall.   The pt presented to the ED on 03/11/18 with significant weakness, hyperkalemia, and increasing leukocytosis with her PCP. She also notes that she had abscesses and facial infections about 4 weeks ago and received Clindamycin. She then developed a vaginal yeast infection, treated with Monistat and worsened to lesions and sores. The pt notes that since beginning antibiotics her cough has improved.   Patient report having a h/o melanoma metastatic to lung s/pwedge resection in 2014 . No recurrence since then. She notes she was been monitored with CT scans since then.  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  Patient notes that she previous needed therapeutic phlebotomy along time back but  denies ever taking medication for her polycythemia vera.   The pt notes that she used to sleep with a CPAP for many years. She then began treatment for her narcolepsy. The pt notes that she is on O2 and has remained on O2 since her surgery in 2014 to resect the melanoma. She has degenerative disc disease, fibromyalgia, and neuropathy in her feet.   The pt notes that she feels less "groggy-headed" today.   Most recent lab results (03/14/18) of CBC and BMP is as follows: all values are WNL except for WBC at 31.0k, RBC at 6.97, HCT at 47.1, MCV at 67.6, MCH at 19.7, MCHC at 29.1, RDW at 22.3, PLT at 542k, Glucose at 138.  On review of systems, pt reports recent infections, feeling tired, moving her bowels well, resolved cough and denies abdominal pains, problems passing urine, and any other symptoms.   On PMHx the pt reports basal cell carcinoma in 2013, metastatic melanoma to lung wedge resected with thoracoscopy on 10/21/12.  Interval History:   Katelyn Cline returns today for management and evaluation of her polycythemia vera. The patient's last visit with [place]  was on 07/07/18. The pt reports that she is doing well overall.   The pt reports that she had a recent cellulitis and dental infection, presented to her PCP's office, is anticipating surgery, and finished an antibiotic course 3 days ago.   The pt has continued to take Hydroxyurea with complete compliance. She notes that she continues smoking a little less than a pack of cigarettes each day. She notes that she has been staying hydrated in the last couple days, but generally does not stay well hydrated.  Lab results today  (09/07/18) of CBC w/diff and CMP is as follows: all values are WNL except for WBC at 20.0k, RBC at 6.18, HCT at 47.6, MCV at 77.0, MCH at 21.2, MCHC at 27.5, RDW at 23.0, PLT at 464k, ANC at 16.6, Basophils abs at 200, Abs immature granulocytes at 0.17k, Glucose at 200, BUN at 23, Albumin at 3.3, AST at 10, Alk Phos at 162. 09/07/18 LDH is pending  On review of systems, pt reports recent infection, and denies abdominal pains, leg swelling, and any other symptoms.  MEDICAL HISTORY:      Past Medical History:  Diagnosis Date  . Agoraphobia   . Basal cell carcinoma   . Chronic pain   . Chronic respiratory failure with hypoxia ([name])    3L [place]  . DM type 2 (diabetes mellitus, type 2) ([place]) 10/16/2013  . Dysrhythmia    Hx: of palpitations "a long time ago"  . Elevated hemoglobin ([place]) 2014  . Fibromyalgia   . History of UTI   . HTN (hypertension)   . Hyperlipidemia   . Major depression   . Metastatic melanoma ([place]) 10/18/2012   12 mm posterior right upper lobe pulmonary nodule, max SUV 3.0    . Migraines   . Miscarriage    x 4  . Narcolepsy   . Panic attacks   . Peripheral neuropathy   . Peripheral neuropathy    in feet  . Polycythemia secondary to smoking   . Polycythemia vera(238.4)   . Scoliosis   . Shortness of breath    Hx: of with activity  . Sleep apnea   . Vertigo     SURGICAL HISTORY:      Past Surgical History:  Procedure Laterality Date  . BASAL CELL CARCINOMA EXCISION     flap surgery on face  . BREAST LUMPECTOMY     bilateral  . COLONOSCOPY W/ BIOPSIES AND POLYPECTOMY     Hx: of  . CYSTECTOMY     abdominal wall   . DILATION AND CURETTAGE OF UTERUS     Hx: of   . VAGINAL HYSTERECTOMY    . VIDEO ASSISTED THORACOSCOPY (VATS)/WEDGE RESECTION Right 10/21/2012   Procedure: VIDEO ASSISTED THORACOSCOPY (VATS)/WEDGE RESECTION;  Surgeon: [name], MD;  Location: [facility];  Service: Thoracic;  Laterality: Right;   Marland Kitchen VIDEO BRONCHOSCOPY N/A 10/21/2012   Procedure: VIDEO BRONCHOSCOPY;  Surgeon: [name], MD;  Location: [facility] OR;  Service: Thoracic;  Laterality: N/A;    SOCIAL HISTORY: Social History        Socioeconomic History  . Marital status: Divorced    Spouse name: Not on file  . Number of children: Not on file  . Years of education: 12+  . Highest education level: Not on file  Occupational History  . Not on file  Social Needs  . Financial resource strain: Not on file  . Food insecurity:    Worry: Not on file  Inability: Not on file  . Transportation needs:    Medical: Not on file    Non-medical: Not on file  Tobacco Use  . Smoking status: Current Every Day Smoker    Packs/day: 1.00    Years: 10.00    Pack years: 10.00    Types: Cigarettes    Last attempt to quit: 09/16/2012    Years since quitting: 5.9  . Smokeless tobacco: Never Used  Substance and Sexual Activity  . Alcohol use: Yes    Comment: 1 -2 drinks a month, occasional  . Drug use: No  . Sexual activity: Not Currently  Lifestyle  . Physical activity:    Days per week: Not on file    Minutes per session: Not on file  . Stress: Not on file  Relationships  . Social connections:    Talks on phone: Not on file    Gets together: Not on file    Attends religious service: Not on file    Active member of club or organization: Not on file    Attends meetings of clubs or organizations: Not on file    Relationship status: Not on file  . Intimate partner violence:    Fear of current or ex partner: Not on file    Emotionally abused: Not on file    Physically abused: Not on file    Forced sexual activity: Not on file  Other Topics Concern  . Not on file  Social History Narrative  . Not on file    FAMILY HISTORY:      Family History  Problem Relation Age of Onset  . Arthritis Mother   . Hyperlipidemia Mother   . Depression Mother   . Anxiety  disorder Mother   . Dementia Mother   . Hypertension Father   . Hyperlipidemia Father   . Heart disease Father   . Stroke Father   . Dementia Father   . Heart disease Brother   . ADD / ADHD Son   . Alcohol abuse Maternal Grandfather   . Bipolar disorder Neg Hx   . Drug abuse Neg Hx   . OCD Neg Hx   . Paranoid behavior Neg Hx   . Schizophrenia Neg Hx   . Seizures Neg Hx   . Sexual abuse Neg Hx   . Physical abuse Neg Hx     ALLERGIES:  is allergic to contrast media [iodinated diagnostic agents]; erythromycin; flagyl [metronidazole]; latex; other; penicillins; and tetracyclines & related.  MEDICATIONS:        Current Outpatient Medications  Medication Sig Dispense Refill  . amLODipine (NORVASC) 5 MG tablet Take 1 tablet (5 mg total) by mouth daily. 90 tablet 2  . aspirin EC 81 MG tablet Take 81 mg by mouth daily.    Marland Kitchen atorvastatin (LIPITOR) 80 MG tablet Take 1 tablet (80 mg total) by mouth daily at 6 PM. 90 tablet 3  . baclofen (LIORESAL) 10 MG tablet Take 10 mg by mouth 2 (two) times daily as needed for muscle spasms.    . chlorhexidine (PERIDEX) 0.12 % solution Use as directed 15 mLs in the mouth or throat 2 (two) times daily. Swish and spit. Do not swallow. 473 mL 0  . DULoxetine (CYMBALTA) 60 MG capsule TAKE 2 CAPSULES BY MOUTH EVERY DAY 60 capsule 2  . empagliflozin (JARDIANCE) 25 MG TABS tablet Take 25 mg by mouth daily. 90 tablet 4  . fenofibrate (TRICOR) 48 MG tablet TAKE 1 TABLET(48 MG) BY MOUTH  DAILY 90 tablet 0  . fish oil-omega-3 fatty acids 1000 MG capsule Take 2 g by mouth 2 (two) times daily.    . fluconazole (DIFLUCAN) 150 MG tablet Take 1 tablet (150 mg total) by mouth every 3 (three) days as needed for up to 2 doses. 2 tablet 0  . fluconazole (DIFLUCAN) 150 MG tablet Take 1 tablet (150 mg total) by mouth every 3 (three) days as needed (for vaginal itching/yeast infection sx). 2 tablet 0  . gabapentin (NEURONTIN) 100 MG capsule TAKE 1 TO 3  CAPSULES(100 TO 300 MG) BY MOUTH THREE TIMES DAILY AS NEEDED FOR NERVE PAIN 180 capsule 1  . Glucose Blood (BLOOD GLUCOSE TEST STRIPS) STRP Please dispense based on insurance and patient preference. Use as directed to monitor FSBS 1x daily. Dx: E11.9. 100 each 3  . hydrochlorothiazide (HYDRODIURIL) 25 MG tablet Take 1 tablet (25 mg total) by mouth daily. 90 tablet 3  . hydroxyurea (HYDREA) 500 MG capsule Take 2 caps (1000 mg) on Monday, Wednesday and Friday and 1 cap (532m) the rest of the days. May take with food to minimize nausea. 60 capsule 2  . lisinopril (ZESTRIL) 2.5 MG tablet Take 1 tablet (2.5 mg total) by mouth daily. 90 tablet 2  . metFORMIN (GLUCOPHAGE) 1000 MG tablet TAKE 1 TABLET BY MOUTH TWICE DAILY WITH A MEAL 180 tablet 0  . MICROLET LANCETS MISC Please dispense based on patient and insurance preference. Use as directed to monitor  FSBS 1x daily. Dx: E11.9. 100 each 11  . oxyCODONE-acetaminophen (PERCOCET) 7.5-325 MG tablet Take 1 tablet by mouth 4 (four) times daily as needed for severe pain.    . Pseudoeph-Doxylamine-DM-APAP (NYQUIL PO) Take 1-2 capsules by mouth at bedtime as needed (for cold/flu).    . valACYclovir (VALTREX) 1000 MG tablet Take 1 tablet (1,000 mg total) by mouth 2 (two) times daily. 20 tablet 0  . varenicline (CHANTIX STARTING MONTH PAK) 0.5 MG X 11 & 1 MG X 42 tablet Take one 0.5 mg tablet by mouth once daily for 3 days, then increase to one 0.5 mg tablet twice daily for 4 days, then increase to one 1 mg tablet twice daily. 53 tablet 0   No current facility-administered medications for this visit.     REVIEW OF SYSTEMS:    A 10+ POINT REVIEW OF SYSTEMS WAS OBTAINED including neurology, dermatology, psychiatry, cardiac, respiratory, lymph, extremities, GI, GU, Musculoskeletal, constitutional, breasts, reproductive, HEENT.  All pertinent positives are noted in the HPI.  All others are negative.   PHYSICAL EXAMINATION: ECOG PERFORMANCE STATUS: 2 -  Symptomatic, <50% confined to bed     Vitals:   09/07/18 1444  BP: (!) 134/55  Pulse: 83  Resp: 18  Temp: 98.4 F (36.9 C)  SpO2: 90%      Filed Weights   09/07/18 1444  Weight: 223 lb 9.6 oz (101.4 kg)   .Body mass index is 34 kg/m.  GENERAL:alert, in no acute distress and comfortable SKIN: no acute rashes, no significant lesions EYES: conjunctiva are pink and non-injected, sclera anicteric OROPHARYNX: MMM, no exudates, no oropharyngeal erythema or ulceration NECK: supple, no JVD LYMPH:  no palpable lymphadenopathy in the cervical, axillary or inguinal regions LUNGS: clear to auscultation b/l with normal respiratory effort HEART: regular rate & rhythm ABDOMEN:  normoactive bowel sounds , non tender, not distended. Splenomegaly 3 fingerbreadths under costal margin..Marland Kitchen Extremity: no pedal edema PSYCH: alert & oriented x 3 with fluent speech NEURO: no focal motor/sensory  deficits   LABORATORY DATA:  I have reviewed the data as listed  . CBC Latest Ref Rng & Units 09/07/2018 07/07/2018 05/19/2018  WBC 4.0 - 10.5 K/uL 20.0(H) 20.5(H) 25.2(H)  Hemoglobin 12.0 - 15.0 g/dL 13.1 12.3 12.7  Hematocrit 36.0 - 46.0 % 47.6(H) 45.1 45.5  Platelets 150 - 400 K/uL 464(H) 335 565(H)   . CBC Labs(Brief)          Component Value Date/Time   WBC 20.0 (H) 09/07/2018 1354   RBC 6.18 (H) 09/07/2018 1354   HGB 13.1 09/07/2018 1354   HGB 12.7 05/19/2018 0811   HCT 47.6 (H) 09/07/2018 1354   PLT 464 (H) 09/07/2018 1354   PLT 565 (H) 05/19/2018 0811   MCV 77.0 (L) 09/07/2018 1354   MCH 21.2 (L) 09/07/2018 1354   MCHC 27.5 (L) 09/07/2018 1354   RDW 23.0 (H) 09/07/2018 1354   LYMPHSABS 2.0 09/07/2018 1354   MONOABS 0.7 09/07/2018 1354   EOSABS 0.4 09/07/2018 1354   BASOSABS 0.2 (H) 09/07/2018 1354       . CMP Latest Ref Rng & Units 09/07/2018 07/07/2018 05/19/2018  Glucose 70 - 99 mg/dL 200(H) 167(H) 152(H)  BUN 6 - 20 mg/dL 23(H) 15 18  Creatinine  0.44 - 1.00 mg/dL 0.85 0.88 0.76  Sodium 135 - 145 mmol/L 144 142 140  Potassium 3.5 - 5.1 mmol/L 4.2 4.9 4.5  Chloride 98 - 111 mmol/L 102 104 102  CO2 22 - 32 mmol/L _0 Calcium 8.9 - 10.3 mg/dL 9.5 9.8 9.8  Total Protein 6.5 - 8.1 g/dL 7.7 7.3 7.5  Total Bilirubin 0.3 - 1.2 mg/dL 0.3 0.4 0.3  Alkaline Phos 38 - 126 U/L 162(H) 163(H) 142(H)  AST 15 - 41 U/L 10(L) 10(L) 10(L)  ALT 0 - 44 U/L _1 08/15/12 JAK2 Mutation:     03/15/18 BM Bx:      RADIOGRAPHIC STUDIES: I have personally reviewed the radiological images as listed and agreed with the findings in the report. ImagingResults  No results found.    ASSESSMENT & PLAN:   58 y.o. female with  1. Jak2 V617F mutation positive Myeloproliferative syndrome.- BM Bx consistent with Polycythemia Vera  JAK2 mutation identified in 08/15/12 labs   03/15/18 BM Bx revealed results consistent with JAK2 positive polycythemia vera   Current hgb.hct are at 14/47.6 Thrombocytosis PLT 725k  2. Increasing leucocytosis - primarily neutrophilia. - related to Polycythemia vera Patient with recent dental infections/abscesses and respiratory infection. Part of Leukocytosis could be due to his MPN  - PCV vs Myelofibrosis.  Neutrophilia began in March 2015 at 9.6k, slowly increased to 14k in August 2018, 19k in March 2019, now to [place] in September 2019.  3. Polycythemia vera Confirmed by 08/15/12 JAK 2 mutation test  4. History of Metastatic melanoma to the right lung  S/p resection by Dr. Servando Snare on 10/21/12, BRAF mutation not detected. -continue q55monthy skin screening her PCP/dermatologist -symptom directed radiologic scanning.   PLAN: -Discussed pt labwork today, 09/07/18; HGB normal at 13.1, HCT at 47.6, PLT at 464k. Improved neutrophilia with ANC at 16.6. LDH at 302 -Discussed that in light of the patient's recent infection, I suspect her increased blood counts are reactive and not a progression  through Hydroxyurea -The pt has no prohibitive toxicities from continuing 10036mHydroxyurea 3 days a week, and 50042mydroxyurea 4 days a week at this time. -Recommend staying well hydrated -PLT goal is to be between 200k and  400k -HCT goal is to have HCT <45% -Now pursuing maintenance phlebotomies, trend has been stable over the last month without successful phlebotomies  -Will watch for signs of chronic inflammation -Recommended that the pt continue to abide by sun protection strategies as Hydroxyurea can sensitize the skin  -Continue ASA 63m po daily  -Counseled the pt towards complete smoking cessation and explained that this would serve as a cause for increased polycythemia and strokes -Recommend Peridex mouthwash -Will refer the pt to our dentist Dr KEnrique Sack -Recommend checking thyroid levels with PCP  -Encouraged her to maintenance compliance with her medications -Recommended that the pt continue to eat well, drink at least 48-64 oz of water each day, and walk 20-30 minutes each day. -Will see the pt back in 3 months, with therapeutic phlebotomy   RTC with Dr KIrene Limbowith labs and therapeutic phlebotomy appointment in 345monthReferral to Dr KuDoreen Beamor recurrent dental asbcesses   All of the patients questions were answered with apparent satisfaction. The patient knows to call the clinic with any problems, questions or concerns.  The total time spent in the appt was 25 minutes and more than 50% was on counseling and direct patient cares.    GaSullivan LoneD MS AAHIVMS SCFishermen'S HospitalTCommunity [facility]acondaematology/Oncology Physician [facility](Office):       [phone] cell):  [phone]):           [phone]/04/2019 3:18 PM  I, ScBaldwin Jamaicaam acting as a scribe for Dr. GaSullivan Lone  .I have reviewed the above documentation for accuracy and completeness, and I agree with the above. .GBrunetta GeneraD         Electronically signed  by KaBrunetta GeneraMD at 09/11/2018 11:14 PM

## 2018-09-19 NOTE — Discharge Instructions (Signed)

## 2018-09-19 NOTE — Progress Notes (Signed)
PRE-OPERATIVE NOTE:  09/19/2018 [name] [id number]  VITALS: BP (!) 125/56   Pulse 72   Temp 98.1 F (36.7 C) (Oral)   Resp 16   Ht 5\' 8"  (1.727 m)   Wt 99.8 kg   SpO2 91%   BMI 33.45 kg/m   Lab Results  Component Value Date   WBC 20.0 (H) 09/07/2018   HGB 13.1 09/07/2018   HCT 47.6 (H) 09/07/2018   MCV 77.0 (L) 09/07/2018   PLT 464 (H) 09/07/2018   BMET    Component Value Date/Time   NA 144 09/07/2018 1354   K 4.2 09/07/2018 1354   CL 102 09/07/2018 1354   CO2 29 09/07/2018 1354   GLUCOSE 200 (H) 09/07/2018 1354   BUN 23 (H) 09/07/2018 1354   CREATININE 0.85 09/07/2018 1354   CREATININE 0.70 04/28/2018 0854   CALCIUM 9.5 09/07/2018 1354   GFRNONAA >60 09/07/2018 1354   GFRNONAA 95 04/28/2018 0854   GFRAA >60 09/07/2018 1354   GFRAA 110 04/28/2018 0854    Lab Results  Component Value Date   INR 1.00 03/12/2018   INR 0.90 10/19/2012   No results found for: PTT   [name] presents for extraction of remaining teeth with alveoloplasty in the operative room and general anesthesia.   SUBJECTIVE: The patient denies any acute medical or dental changes and agrees to proceed with treatment as planned. Patient expresses understanding about potential risk for coronavirus complications and infection at this time.  EXAM: No sign of acute dental changes.  ASSESSMENT: Patient is affected by acute pulpitis, history of dental abscesses, chronic apical periodontitis, dental caries, retained root segments, chronic periodontitis, and loose teeth.  PLAN: Patient agrees to proceed with treatment as planned in the operating room as previously discussed and accepts the risks, benefits, and complications of the proposed treatment. Patient is aware of the risk for bleeding, bruising, swelling, infection, pain, nerve damage, soft tissue damage,sinus involvement, root tip fracture, mandible fracture, and the risks of complications associated with the anesthesia.  Patient also is aware of the potential for other complications including Coronavirus complications and infection and other complications up to and including death.   [name], DDS

## 2018-09-19 NOTE — Transfer of Care (Signed)
Immediate Anesthesia Transfer of Care Note  Patient: Katelyn Cline  Procedure(s) Performed: Extraction of tooth #'s 2, 3, 6-14, 18, and 20-30 with alveoloplasty (N/A )  Patient Location: PACU  Anesthesia Type:General  Level of Consciousness: awake, alert  and oriented  Airway & Oxygen Therapy: Patient Spontanous Breathing and Patient connected to face mask oxygen  Post-op Assessment: Report given to RN and Post -op Vital signs reviewed and stable  Post vital signs: Reviewed and stable  Last Vitals:  Vitals Value Taken Time  BP 153/55 09/19/2018 10:00 AM  Temp    Pulse 91 09/19/2018 10:01 AM  Resp 23 09/19/2018 10:01 AM  SpO2 87 % 09/19/2018 10:01 AM  Vitals shown include unvalidated device data.  Last Pain:  Vitals:   09/19/18 0540  TempSrc:   PainSc: 2          Complications: No apparent anesthesia complications

## 2018-09-19 NOTE — Anesthesia Procedure Notes (Signed)
Procedure Name: Intubation Date/Time: 09/19/2018 7:38 AM Performed by: Maxwell Caul, CRNA Pre-anesthesia Checklist: Patient identified, Emergency Drugs available, Suction available and Patient being monitored Patient Re-evaluated:Patient Re-evaluated prior to induction Oxygen Delivery Method: Circle system utilized Preoxygenation: Pre-oxygenation with 100% oxygen Induction Type: IV induction Ventilation: Mask ventilation without difficulty Laryngoscope Size: Glidescope and 4 Grade View: Grade I Nasal Tubes: Nasal Rae and Right Tube size: 6.5 mm Number of attempts: 1 Placement Confirmation: ETT inserted through vocal cords under direct vision,  positive ETCO2 and breath sounds checked- equal and bilateral Tube secured with: Tape Dental Injury: Teeth and Oropharynx as per pre-operative assessment  Comments: Glidescope #4 utilized for nasal intubation per Surgeon request. Nasal Rae # 6.5 lubricated and gently placed in right nostril (Afrin nasal spray X 2 pre-op in each nostril). Glidescope #4 utilized, Grade 1 view and ETT passed with ease (no McGill forceps used). Ett secured per Surgeon assistance.

## 2018-09-20 ENCOUNTER — Encounter (HOSPITAL_COMMUNITY): Payer: Self-pay | Admitting: Dentistry

## 2018-09-28 DIAGNOSIS — C3490 Malignant neoplasm of unspecified part of unspecified bronchus or lung: Secondary | ICD-10-CM | POA: Diagnosis not present

## 2018-09-28 DIAGNOSIS — J9611 Chronic respiratory failure with hypoxia: Secondary | ICD-10-CM | POA: Diagnosis not present

## 2018-09-29 ENCOUNTER — Ambulatory Visit (HOSPITAL_COMMUNITY): Payer: Self-pay | Admitting: Dentistry

## 2018-09-29 ENCOUNTER — Telehealth (HOSPITAL_COMMUNITY): Payer: Self-pay

## 2018-09-29 NOTE — Telephone Encounter (Signed)
09/29/2018    Patient called the morning of the appointment to cancel her suture removal procedure. Patient does not want to risk infection with the COVID 19 virus at this time. Patient denies having any problems with the extraction sites. Patient has several stitches present that are loosely intact. Patient to continue using salt water rinses as needed to aid healing and advance diet as tolerated. Patient will call back if problems arise. Patient is a follow the dentist of her choice for fabrication of upper lower complete dentures after adequate healing. KW/Dr. Enrique Sack

## 2018-10-06 ENCOUNTER — Other Ambulatory Visit: Payer: Self-pay | Admitting: Hematology

## 2018-10-06 DIAGNOSIS — G894 Chronic pain syndrome: Secondary | ICD-10-CM | POA: Diagnosis not present

## 2018-10-06 DIAGNOSIS — Z9189 Other specified personal risk factors, not elsewhere classified: Secondary | ICD-10-CM | POA: Diagnosis not present

## 2018-10-06 DIAGNOSIS — Z79899 Other long term (current) drug therapy: Secondary | ICD-10-CM | POA: Diagnosis not present

## 2018-10-06 DIAGNOSIS — M5136 Other intervertebral disc degeneration, lumbar region: Secondary | ICD-10-CM | POA: Diagnosis not present

## 2018-11-04 ENCOUNTER — Other Ambulatory Visit: Payer: Self-pay | Admitting: Family Medicine

## 2018-11-04 DIAGNOSIS — Z79899 Other long term (current) drug therapy: Secondary | ICD-10-CM | POA: Diagnosis not present

## 2018-11-04 DIAGNOSIS — G894 Chronic pain syndrome: Secondary | ICD-10-CM | POA: Diagnosis not present

## 2018-11-04 DIAGNOSIS — F1120 Opioid dependence, uncomplicated: Secondary | ICD-10-CM | POA: Diagnosis not present

## 2018-11-04 DIAGNOSIS — M5030 Other cervical disc degeneration, unspecified cervical region: Secondary | ICD-10-CM | POA: Diagnosis not present

## 2018-11-16 ENCOUNTER — Ambulatory Visit (INDEPENDENT_AMBULATORY_CARE_PROVIDER_SITE_OTHER): Payer: Medicare HMO | Admitting: Family Medicine

## 2018-11-16 ENCOUNTER — Other Ambulatory Visit: Payer: Self-pay

## 2018-11-16 ENCOUNTER — Encounter: Payer: Self-pay | Admitting: Family Medicine

## 2018-11-16 DIAGNOSIS — E119 Type 2 diabetes mellitus without complications: Secondary | ICD-10-CM

## 2018-11-16 DIAGNOSIS — F17200 Nicotine dependence, unspecified, uncomplicated: Secondary | ICD-10-CM

## 2018-11-16 DIAGNOSIS — F329 Major depressive disorder, single episode, unspecified: Secondary | ICD-10-CM | POA: Diagnosis not present

## 2018-11-16 DIAGNOSIS — F32A Depression, unspecified: Secondary | ICD-10-CM

## 2018-11-16 DIAGNOSIS — I10 Essential (primary) hypertension: Secondary | ICD-10-CM | POA: Diagnosis not present

## 2018-11-16 DIAGNOSIS — E782 Mixed hyperlipidemia: Secondary | ICD-10-CM | POA: Diagnosis not present

## 2018-11-16 MED ORDER — BUPROPION HCL ER (SR) 100 MG PO TB12: 100.0000 mg | ORAL_TABLET | Freq: Two times a day (BID) | ORAL | 3 refills | Status: DC

## 2018-11-16 NOTE — Progress Notes (Signed)
Virtual Visit via Telephone Note  I connected with Katelyn Cline on 11/16/18 at 11:40am by telephone and verified that I am speaking with the correct person using two identifiers.    Pt location: at home   Physician location:  In office, [facility], [name] MD     On call: patient and physician     I discussed the limitations, risks, security and privacy concerns of performing an evaluation and management service by telephone and the availability of in person appointments. I also discussed with the patient that there may be a patient responsible charge related to this service. The patient expressed understanding and agreed to proceed.   History of Present Illness:  Telephone visit to f/u chronic medical problems and medications  She was recently seen at [place] [facility] - still on percocet taking four times a day   and baclofen three times a day   DM- taking jardiance,Metformin,last A1C 6.2%  has not taken blood sugars recently, , states occ takes 138-211 randomly read off her meter, it has been high at times and thennormal, , states highest 200lbs   Hyperlipidemia- last labs in Jan, TG improved to 178, LDL at goal , taking tricor/lipitor   HTN- does not check at home, but was a little elevated at pain clinic 2 weeks ago, but very nervous going into the office   Smoking- continues to smoke, chantix would not cover  MDD- states she has felt more depressed past couple of months , COVID contributes, doesn't care about quitting tobacco at this time, would like to add something  tohelp with her mood.  Denies feeling overly anxious, no SI, just feels down   She had teeth pulled from Dr. Enrique Sack    Observations/Objective: NAD, normal speech on the phone    Assessment and Plan: MDD- add wellbutrin 100mg  BID to her Cymbalta which also helps treat her chronic pain.  This will also help with her tobacco cessation. Tobacco cessation per above Wellbutrin  added.  Her insurance did not cover the Chantix.  Hypertension continue current medications.  We will plan to check in the office either at oncology or my office within the next 2 months.  Diabetes mellitus blood sugars have been well controlled.  We will plan for fasting labs along with her cholesterol  Chronic pain followed by pain management  Follow Up Instructions:  F/u July for labs if unable to get to cancer center     I discussed the assessment and treatment plan with the patient. The patient was provided an opportunity to ask questions and all were answered. The patient agreed with the plan and demonstrated an understanding of the instructions.   The patient was advised to call back or seek an in-person evaluation if the symptoms worsen or if the condition fails to improve as anticipated.  I provided 15 minutes of non-face-to-face time during this encounter. End Time:11:55  [name], MD

## 2018-12-02 ENCOUNTER — Other Ambulatory Visit: Payer: Self-pay | Admitting: Family Medicine

## 2018-12-02 DIAGNOSIS — G894 Chronic pain syndrome: Secondary | ICD-10-CM | POA: Diagnosis not present

## 2018-12-02 DIAGNOSIS — Z9189 Other specified personal risk factors, not elsewhere classified: Secondary | ICD-10-CM | POA: Diagnosis not present

## 2018-12-02 DIAGNOSIS — M5136 Other intervertebral disc degeneration, lumbar region: Secondary | ICD-10-CM | POA: Diagnosis not present

## 2018-12-05 ENCOUNTER — Other Ambulatory Visit: Payer: Self-pay | Admitting: Family Medicine

## 2018-12-05 ENCOUNTER — Telehealth: Payer: Self-pay | Admitting: *Deleted

## 2018-12-05 DIAGNOSIS — M5030 Other cervical disc degeneration, unspecified cervical region: Secondary | ICD-10-CM | POA: Diagnosis not present

## 2018-12-05 DIAGNOSIS — G894 Chronic pain syndrome: Secondary | ICD-10-CM | POA: Diagnosis not present

## 2018-12-05 DIAGNOSIS — M545 Low back pain: Secondary | ICD-10-CM | POA: Diagnosis not present

## 2018-12-05 DIAGNOSIS — M5136 Other intervertebral disc degeneration, lumbar region: Secondary | ICD-10-CM | POA: Diagnosis not present

## 2018-12-05 NOTE — Telephone Encounter (Signed)
Patient called to change appts currently scheduled for 6/12. She is concerned about safety r/t COVID 19 exposure and would prefer to wait if it is ok with [name]. Question sent to [name].

## 2018-12-07 ENCOUNTER — Telehealth: Payer: Self-pay | Admitting: Hematology

## 2018-12-07 NOTE — Telephone Encounter (Signed)
Scheduled appt per sch msg. Called and left msg for patient.

## 2018-12-09 ENCOUNTER — Inpatient Hospital Stay: Payer: Medicare HMO

## 2018-12-09 ENCOUNTER — Telehealth: Payer: Self-pay | Admitting: *Deleted

## 2018-12-09 ENCOUNTER — Inpatient Hospital Stay: Payer: Medicare HMO | Admitting: Hematology

## 2018-12-09 ENCOUNTER — Inpatient Hospital Stay: Payer: Medicare HMO | Attending: Hematology

## 2018-12-09 DIAGNOSIS — E119 Type 2 diabetes mellitus without complications: Secondary | ICD-10-CM | POA: Insufficient documentation

## 2018-12-09 DIAGNOSIS — Z79899 Other long term (current) drug therapy: Secondary | ICD-10-CM | POA: Insufficient documentation

## 2018-12-09 DIAGNOSIS — Z85820 Personal history of malignant melanoma of skin: Secondary | ICD-10-CM | POA: Insufficient documentation

## 2018-12-09 DIAGNOSIS — I10 Essential (primary) hypertension: Secondary | ICD-10-CM | POA: Insufficient documentation

## 2018-12-09 DIAGNOSIS — D45 Polycythemia vera: Secondary | ICD-10-CM | POA: Insufficient documentation

## 2018-12-09 DIAGNOSIS — Z7982 Long term (current) use of aspirin: Secondary | ICD-10-CM | POA: Insufficient documentation

## 2018-12-09 DIAGNOSIS — Z90710 Acquired absence of both cervix and uterus: Secondary | ICD-10-CM | POA: Insufficient documentation

## 2018-12-09 DIAGNOSIS — F17210 Nicotine dependence, cigarettes, uncomplicated: Secondary | ICD-10-CM | POA: Insufficient documentation

## 2018-12-09 NOTE — Telephone Encounter (Signed)
Received message from [facility] screener late Thursday: patient wanted to reschdeule Friday's appointments for lab/Dr.Kale/Phlebotomy to another day. Needs appointments to be later in morning for transportation reasons.  Attempted to contact patient - lvm asking her to contact office to confirm cancellation of today's appt and to provide time she could be rescheduled.

## 2018-12-22 ENCOUNTER — Telehealth: Payer: Self-pay

## 2018-12-22 ENCOUNTER — Telehealth: Payer: Self-pay | Admitting: Internal Medicine

## 2018-12-22 NOTE — Telephone Encounter (Signed)
appt per 6/25 sch message - pt is aware of new appt date and time

## 2018-12-22 NOTE — Telephone Encounter (Signed)
I spoke with patient and she would like to have her appointments for 6/26 rescheduled because of transportation reasons. She needs all of  her appointments to start at 10am. I canceld her appointments for 6/26 and sent a scheduling message. RN Carlyon Prows is aware.

## 2018-12-23 ENCOUNTER — Inpatient Hospital Stay: Payer: Medicare HMO

## 2018-12-23 ENCOUNTER — Other Ambulatory Visit: Payer: Self-pay | Admitting: Family Medicine

## 2018-12-23 ENCOUNTER — Inpatient Hospital Stay (HOSPITAL_BASED_OUTPATIENT_CLINIC_OR_DEPARTMENT_OTHER): Payer: Medicare HMO | Admitting: Hematology

## 2018-12-23 ENCOUNTER — Other Ambulatory Visit: Payer: Self-pay

## 2018-12-23 ENCOUNTER — Inpatient Hospital Stay: Payer: Medicare HMO | Admitting: Hematology

## 2018-12-23 VITALS — BP 139/70 | HR 80 | Temp 98.9°F | Resp 18 | Ht 68.0 in | Wt 217.1 lb

## 2018-12-23 VITALS — BP 140/65 | HR 66 | Temp 98.4°F | Resp 18

## 2018-12-23 DIAGNOSIS — D45 Polycythemia vera: Secondary | ICD-10-CM

## 2018-12-23 DIAGNOSIS — E119 Type 2 diabetes mellitus without complications: Secondary | ICD-10-CM

## 2018-12-23 DIAGNOSIS — Z79899 Other long term (current) drug therapy: Secondary | ICD-10-CM | POA: Diagnosis not present

## 2018-12-23 DIAGNOSIS — Z7982 Long term (current) use of aspirin: Secondary | ICD-10-CM | POA: Diagnosis not present

## 2018-12-23 DIAGNOSIS — Z90710 Acquired absence of both cervix and uterus: Secondary | ICD-10-CM

## 2018-12-23 DIAGNOSIS — D471 Chronic myeloproliferative disease: Secondary | ICD-10-CM | POA: Diagnosis present

## 2018-12-23 DIAGNOSIS — I10 Essential (primary) hypertension: Secondary | ICD-10-CM

## 2018-12-23 DIAGNOSIS — E782 Mixed hyperlipidemia: Secondary | ICD-10-CM | POA: Diagnosis not present

## 2018-12-23 DIAGNOSIS — Z85820 Personal history of malignant melanoma of skin: Secondary | ICD-10-CM

## 2018-12-23 DIAGNOSIS — F17210 Nicotine dependence, cigarettes, uncomplicated: Secondary | ICD-10-CM | POA: Diagnosis not present

## 2018-12-23 LAB — CBC WITH DIFFERENTIAL/PLATELET
Abs Immature Granulocytes: 0.15 10*3/uL — ABNORMAL HIGH (ref 0.00–0.07)
Basophils Absolute: 0.2 10*3/uL — ABNORMAL HIGH (ref 0.0–0.1)
Basophils Relative: 1 %
Eosinophils Absolute: 0.4 10*3/uL (ref 0.0–0.5)
Eosinophils Relative: 2 %
HCT: 51.5 % — ABNORMAL HIGH (ref 36.0–46.0)
Hemoglobin: 14.2 g/dL (ref 12.0–15.0)
Immature Granulocytes: 1 %
Lymphocytes Relative: 11 %
Lymphs Abs: 2 10*3/uL (ref 0.7–4.0)
MCH: 21.4 pg — ABNORMAL LOW (ref 26.0–34.0)
MCHC: 27.6 g/dL — ABNORMAL LOW (ref 30.0–36.0)
MCV: 77.6 fL — ABNORMAL LOW (ref 80.0–100.0)
Monocytes Absolute: 0.6 10*3/uL (ref 0.1–1.0)
Monocytes Relative: 3 %
Neutro Abs: 14.3 10*3/uL — ABNORMAL HIGH (ref 1.7–7.7)
Neutrophils Relative %: 82 %
Platelets: 246 10*3/uL (ref 150–400)
RBC: 6.64 MIL/uL — ABNORMAL HIGH (ref 3.87–5.11)
RDW: 23.1 % — ABNORMAL HIGH (ref 11.5–15.5)
WBC: 17.5 10*3/uL — ABNORMAL HIGH (ref 4.0–10.5)
nRBC: 0.1 % (ref 0.0–0.2)

## 2018-12-23 LAB — CMP (CANCER CENTER ONLY)
ALT: 12 U/L (ref 0–44)
AST: 12 U/L — ABNORMAL LOW (ref 15–41)
Albumin: 3.5 g/dL (ref 3.5–5.0)
Alkaline Phosphatase: 150 U/L — ABNORMAL HIGH (ref 38–126)
Anion gap: 10 (ref 5–15)
BUN: 22 mg/dL — ABNORMAL HIGH (ref 6–20)
CO2: 28 mmol/L (ref 22–32)
Calcium: 9.5 mg/dL (ref 8.9–10.3)
Chloride: 103 mmol/L (ref 98–111)
Creatinine: 0.8 mg/dL (ref 0.44–1.00)
GFR, Est AFR Am: >60 mL/min (ref >60)
GFR, Estimated: >60 mL/min (ref >60)
Glucose, Bld: 130 mg/dL — ABNORMAL HIGH (ref 70–99)
Potassium: 4.5 mmol/L (ref 3.5–5.1)
Sodium: 141 mmol/L (ref 135–145)
Total Bilirubin: 0.3 mg/dL (ref 0.3–1.2)
Total Protein: 7.5 g/dL (ref 6.5–8.1)

## 2018-12-23 LAB — LACTATE DEHYDROGENASE: LDH: 255 U/L — ABNORMAL HIGH (ref 98–192)

## 2018-12-23 NOTE — Progress Notes (Signed)
HEMATOLOGY/ONCOLOGY CLINIC NOTE  Date of Service: 12/23/2018  Patient Care Team: [facility], Modena Nunnery, MD as PCP - General (Family Medicine) Fanny Bien, MD as Attending Physician (Family Medicine) Everardo All, MD (Hematology and Oncology) [name], MD as Attending Physician (Cardiothoracic Surgery)  CHIEF COMPLAINTS/PURPOSE OF CONSULTATION:  Continue mx of Jak2 mutation positive MPN  HISTORY OF PRESENTING ILLNESS:   Katelyn Cline is a wonderful 58 y.o. female who has been referred to [place] by Dr. Tana Cline  for evaluation and management of Leukocytosis. The pt reports that she is doing well overall.   The pt presented to the ED on 03/11/18 with significant weakness, hyperkalemia, and increasing leukocytosis with her PCP. She also notes that she had abscesses and facial infections about 4 weeks ago and received Clindamycin. She then developed a vaginal yeast infection, treated with Monistat and worsened to lesions and sores. The pt notes that since beginning antibiotics her cough has improved.   Patient report having a h/o melanoma metastatic to lung s/pwedge resection in 2014 . No recurrence since then. She notes she was been monitored with CT scans since then.  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  Patient notes that she previous needed therapeutic phlebotomy along time back but  denies ever taking medication for her polycythemia vera.   The pt notes that she used to sleep with a CPAP for many years. She then began treatment for her narcolepsy. The pt notes that she is on O2 and has remained on O2 since her surgery in 2014 to resect the melanoma. She has degenerative disc disease, fibromyalgia, and neuropathy in her feet.   The pt notes that she feels less "groggy-headed" today.   Most  recent lab results (03/14/18) of CBC and BMP is as follows: all values are WNL except for WBC at 31.0k, RBC at 6.97, HCT at 47.1, MCV at 67.6, MCH at 19.7, MCHC at 29.1, RDW at 22.3, PLT at 542k, Glucose at 138.  On review of systems, pt reports recent infections, feeling tired, moving her bowels well, resolved cough and denies abdominal pains, problems passing urine, and any other symptoms.   On PMHx the pt reports basal cell carcinoma in 2013, metastatic melanoma to lung wedge resected with thoracoscopy on 10/21/12.  Interval History:   Katelyn Cline returns today for management and evaluation of her polycythemia vera. The patient's last visit with [place] was on 09/07/18. The pt reports that she is doing well overall.  The pt reports that she has not developed any new concerns in the interim. She was prescribed Welbutrin with her PCP and notes that she hasn't begun this yet. She continues smoking 1/2 ppd of cigarettes. The pt notes that she is staying well hydrated. The pt has not consumed any alcohol in the interim, and only infrequently consumes alcohol in general. She has avoided cooking in cast iron skillets.   The pt did have the rest of her teeth removed in the interim with her dentist. She has been eating softer foods and is looking into dentures. She denies any remaining dental pains.   Lab results today (12/23/18) of CBC w/diff and CMP is as follows: all values are WNL except for WBC at 17.5k, RBC at 6.64, HCT at 51.5%, MCV at 77.6, MCH at 21.4, MCHC at  27.6, RDW at 23.1, ANC at 14.3k, Glucose at 130, BUN at 22, AST at 12, Alk Phos at 150. 12/23/18 LDH at 255  On review of systems, pt reports stable energy levels, eating well, staying hydrated, and denies concerns for infections, leg swelling, abdominal pains, concerns for infections, and any other symptoms.   MEDICAL HISTORY:  Past Medical History:  Diagnosis Date   Agoraphobia    Arthritis    "all joints"   Chronic low back pain      Chronic respiratory failure with hypoxia ([name])    4L  via [place],  followed by pcp,   (09-16-2018  per pt only uses while at home and at night ,  portable oxygen is not working)   COPD (chronic obstructive pulmonary disease) ([place])    DDD (degenerative disc disease), lumbosacral    Dental caries    DM type 2 (diabetes mellitus, type 2) ([place])    followed by pcp   Fibromyalgia    GERD (gastroesophageal reflux disease)    occasional , no meds   History of basal cell carcinoma (BCC) excision    s/p  Moh's of face/ nose 12/ 2013   History of palpitations    per pt found to have occaional PVCs   History of UTI    HTN (hypertension)    Hyperlipidemia    Loose, teeth    Major depression    Metastatic melanoma ([place]) 10/18/2012   12 mm posterior right upper lobe pulmonary nodule, max SUV 3.0  s/p  right wedge resection 10-21-2012,  followed by oncologist-- dr Irene Limbo,  no recurrence   Mixed stress and urge urinary incontinence    Myeloproliferative neoplasm ([place])    Narcolepsy    OSA (obstructive sleep apnea)    Oxygen dependent    4 L via [place],  per pt portable oxygen not working but does use while at home and at night   Panic attacks    Periodontitis    Peripheral neuropathy    feet   Polycythemia vera(238.4) hematology/ oncology-- dr Irene Limbo ([facility])   first dx 02014 due to smoker/copd--  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)  hx phlebotomies   Pulmonary nodules    bilateral small stable per CT 03-11-2018   Scoliosis    Smokers' cough ([place])    per pt productive a little in the morning's   Wears glasses     SURGICAL HISTORY: Past Surgical History:  Procedure Laterality Date   ABDOMINAL HYSTERECTOMY  1998   BREAST LUMPECTOMY Bilateral 1995   benign per pt   COLONOSCOPY W/ BIOPSIES AND POLYPECTOMY     Hx: of   CYSTECTOMY  2000   abdominal wall    DILATION AND CURETTAGE OF UTERUS  yrs ago   MOHS SURGERY  2013    nose/face   MULTIPLE EXTRACTIONS WITH ALVEOLOPLASTY N/A 09/19/2018   Procedure: Extraction of tooth #'s 2, 3, 6-14, 18, and 20-30 with alveoloplasty;  Surgeon: [name], DDS;  Location: [facility];  Service: Oral Surgery;  Laterality: N/A;  GENERAL WITH NASAL TUBE   VIDEO ASSISTED THORACOSCOPY (VATS)/WEDGE RESECTION Right 10/21/2012   Procedure: VIDEO ASSISTED THORACOSCOPY (VATS)/WEDGE RESECTION;  Surgeon: [name], MD;  Location: [place];  Service: Thoracic;  Laterality: Right;   VIDEO BRONCHOSCOPY N/A 10/21/2012   Procedure: VIDEO BRONCHOSCOPY;  Surgeon: [name], MD;  Location: [place];  Service: Thoracic;  Laterality: N/A;    SOCIAL HISTORY: Social History   Socioeconomic  History   Marital status: Divorced    Spouse name: Not on file   Number of children: Not on file   Years of education: 12+   Highest education level: Not on file  Occupational History   Not on file  Social Needs   Financial resource strain: Not on file   Food insecurity    Worry: Not on file    Inability: Not on file   Transportation needs    Medical: Not on file    Non-medical: Not on file  Tobacco Use   Smoking status: Current Every Day Smoker    Packs/day: 1.00    Years: 50.00    Pack years: 50.00    Types: Cigarettes   Smokeless tobacco: Never Used  Substance and Sexual Activity   Alcohol use: Yes    Comment: occasional   Drug use: No   Sexual activity: Not on file  Lifestyle   Physical activity    Days per week: Not on file    Minutes per session: Not on file   Stress: Not on file  Relationships   Social connections    Talks on phone: Not on file    Gets together: Not on file    Attends religious service: Not on file    Active member of club or organization: Not on file    Attends meetings of clubs or organizations: Not on file    Relationship status: Not on file   Intimate partner violence    Fear of current or ex partner: Not on file     Emotionally abused: Not on file    Physically abused: Not on file    Forced sexual activity: Not on file  Other Topics Concern   Not on file  Social History Narrative   Not on file    FAMILY HISTORY: Family History  Problem Relation Age of Onset   Arthritis Mother    Hyperlipidemia Mother    Depression Mother    Anxiety disorder Mother    Dementia Mother    Hypertension Father    Hyperlipidemia Father    Heart disease Father    Stroke Father    Dementia Father    Heart disease Brother    ADD / ADHD Son    Alcohol abuse Maternal Grandfather    Bipolar disorder Neg Hx    Drug abuse Neg Hx    OCD Neg Hx    Paranoid behavior Neg Hx    Schizophrenia Neg Hx    Seizures Neg Hx    Sexual abuse Neg Hx    Physical abuse Neg Hx     ALLERGIES:  is allergic to contrast media [iodinated diagnostic agents]; erythromycin; flagyl [metronidazole]; latex; other; penicillins; and tetracyclines & related.  MEDICATIONS:  Current Outpatient Medications  Medication Sig Dispense Refill   amLODipine (NORVASC) 5 MG tablet Take 1 tablet (5 mg total) by mouth daily. (Patient taking differently: Take 5 mg by mouth at bedtime. ) 90 tablet 2   aspirin EC 81 MG tablet Take 81 mg by mouth daily.     atorvastatin (LIPITOR) 80 MG tablet TAKE 1 TABLET BY MOUTH DAILY AT 6:00 PM 90 tablet 3   baclofen (LIORESAL) 10 MG tablet Take 10 mg by mouth 3 (three) times daily.      buPROPion (WELLBUTRIN SR) 100 MG 12 hr tablet Take 1 tablet (100 mg total) by mouth 2 (two) times daily. 60 tablet 3   Carboxymethylcellul-Glycerin (LUBRICATING EYE DROPS OP) Apply 1  application topically daily as needed (dry eyes).     DULoxetine (CYMBALTA) 60 MG capsule TAKE 2 CAPSULES BY MOUTH EVERY DAY 60 capsule 2   fenofibrate (TRICOR) 48 MG tablet TAKE 1 TABLET(48 MG) BY MOUTH DAILY 90 tablet 0   fish oil-omega-3 fatty acids 1000 MG capsule Take 2 g by mouth 2 (two) times daily.     fluconazole  (DIFLUCAN) 150 MG tablet Take 1 tablet (150 mg total) by mouth every 3 (three) days as needed (for vaginal itching/yeast infection sx). 2 tablet 0   gabapentin (NEURONTIN) 100 MG capsule TAKE 1 TO 3 CAPSULES(100 TO 300 MG) BY MOUTH THREE TIMES DAILY AS NEEDED FOR NERVE PAIN (Patient taking differently: Take 100-300 mg by mouth 3 (three) times daily as needed (pain). ) 180 capsule 1   Glucose Blood (BLOOD GLUCOSE TEST STRIPS) STRP Please dispense based on insurance and patient preference. Use as directed to monitor FSBS 1x daily. Dx: E11.9. 100 each 3   hydrochlorothiazide (HYDRODIURIL) 25 MG tablet Take 1 tablet (25 mg total) by mouth daily. 90 tablet 3   hydroxyurea (HYDREA) 500 MG capsule TAKE 2 CAPSULES BY MOUTH ON MONDAY, WEDNESDAY, FRIDAY, THEN 1 CAPSULE ON THE REST OF THE DAYS. MAY TAKE WITH FOOD TO MINIMIZE NAUSEA 60 capsule 2   JARDIANCE 25 MG TABS tablet TAKE 1 TABLET BY MOUTH DAILY 90 tablet 4   lisinopril (ZESTRIL) 2.5 MG tablet Take 1 tablet (2.5 mg total) by mouth daily. (Patient taking differently: Take 2.5 mg by mouth at bedtime. ) 90 tablet 2   metFORMIN (GLUCOPHAGE) 1000 MG tablet TAKE 1 TABLET BY MOUTH TWICE DAILY WITH A MEAL 180 tablet 0   MICROLET LANCETS MISC Please dispense based on patient and insurance preference. Use as directed to monitor  FSBS 1x daily. Dx: E11.9. 100 each 11   oxyCODONE-acetaminophen (PERCOCET) 7.5-325 MG tablet Take 1 tablet by mouth 4 (four) times daily as needed for severe pain.     Pseudoeph-Doxylamine-DM-APAP (NYQUIL PO) Take 1-2 capsules by mouth at bedtime as needed (for cold/flu).     senna (SENOKOT) 8.6 MG TABS tablet Take 2 tablets by mouth daily as needed for mild constipation.     valACYclovir (VALTREX) 1000 MG tablet Take 1,000 mg by mouth 2 (two) times daily as needed (yeast infections).     No current facility-administered medications for this visit.     REVIEW OF SYSTEMS:    A 10+ POINT REVIEW OF SYSTEMS WAS OBTAINED  including neurology, dermatology, psychiatry, cardiac, respiratory, lymph, extremities, GI, GU, Musculoskeletal, constitutional, breasts, reproductive, HEENT.  All pertinent positives are noted in the HPI.  All others are negative.   PHYSICAL EXAMINATION: ECOG PERFORMANCE STATUS: 2 - Symptomatic, <50% confined to bed  Vitals:   12/23/18 1129  BP: 139/70  Pulse: 80  Resp: 18  Temp: 98.9 F (37.2 C)  SpO2: 95%   Filed Weights   12/23/18 1129  Weight: 217 lb 1.6 oz (98.5 kg)   .Body mass index is 33.01 kg/m.  GENERAL:alert, in no acute distress and comfortable SKIN: no acute rashes, no significant lesions EYES: conjunctiva are pink and non-injected, sclera anicteric OROPHARYNX: MMM, no exudates, no oropharyngeal erythema or ulceration NECK: supple, no JVD LYMPH:  no palpable lymphadenopathy in the cervical, axillary or inguinal regions LUNGS: clear to auscultation b/l with normal respiratory effort HEART: regular rate & rhythm ABDOMEN:  normoactive bowel sounds , non tender, not distended. Splenomegaly 3 fingerbreadths under costal margin.  Extremity: no pedal edema  PSYCH: alert & oriented x 3 with fluent speech NEURO: no focal motor/sensory deficits   LABORATORY DATA:  I have reviewed the data as listed  . CBC Latest Ref Rng & Units 12/23/2018 09/07/2018 07/07/2018  WBC 4.0 - 10.5 K/uL 17.5(H) 20.0(H) 20.5(H)  Hemoglobin 12.0 - 15.0 g/dL 14.2 13.1 12.3  Hematocrit 36.0 - 46.0 % 51.5(H) 47.6(H) 45.1  Platelets 150 - 400 K/uL 246 464(H) 335   . CBC    Component Value Date/Time   WBC 17.5 (H) 12/23/2018 1104   RBC 6.64 (H) 12/23/2018 1104   HGB 14.2 12/23/2018 1104   HGB 12.7 05/19/2018 0811   HCT 51.5 (H) 12/23/2018 1104   PLT 246 12/23/2018 1104   PLT 565 (H) 05/19/2018 0811   MCV 77.6 (L) 12/23/2018 1104   MCH 21.4 (L) 12/23/2018 1104   MCHC 27.6 (L) 12/23/2018 1104   RDW 23.1 (H) 12/23/2018 1104   LYMPHSABS 2.0 12/23/2018 1104   MONOABS 0.6 12/23/2018 1104    EOSABS 0.4 12/23/2018 1104   BASOSABS 0.2 (H) 12/23/2018 1104     . CMP Latest Ref Rng & Units 12/23/2018 09/07/2018 07/07/2018  Glucose 70 - 99 mg/dL 130(H) 200(H) 167(H)  BUN 6 - 20 mg/dL 22(H) 23(H) 15  Creatinine 0.44 - 1.00 mg/dL 0.80 0.85 0.88  Sodium 135 - 145 mmol/L 141 144 142  Potassium 3.5 - 5.1 mmol/L 4.5 4.2 4.9  Chloride 98 - 111 mmol/L 103 102 104  CO2 22 - 32 mmol/L '28 29 29  '$ Calcium 8.9 - 10.3 mg/dL 9.5 9.5 9.8  Total Protein 6.5 - 8.1 g/dL 7.5 7.7 7.3  Total Bilirubin 0.3 - 1.2 mg/dL 0.3 0.3 0.4  Alkaline Phos 38 - 126 U/L 150(H) 162(H) 163(H)  AST 15 - 41 U/L 12(L) 10(L) 10(L)  ALT 0 - 44 U/L '12 10 10   '$ 08/15/12 JAK2 Mutation:     03/15/18 BM Bx:      RADIOGRAPHIC STUDIES: I have personally reviewed the radiological images as listed and agreed with the findings in the report. No results found.  ASSESSMENT & PLAN:   58 y.o. female with  1. Jak2 V617F mutation positive Myeloproliferative syndrome.- BM Bx consistent with Polycythemia Vera  JAK2 mutation identified in 08/15/12 labs   03/15/18 BM Bx revealed results consistent with JAK2 positive polycythemia vera   Current hgb.hct are at 14/47.6 Thrombocytosis PLT 725k  2. Increasing leucocytosis - primarily neutrophilia. - related to Polycythemia vera Patient with recent dental infections/abscesses and respiratory infection. Part of Leukocytosis could be due to his MPN  - PCV vs Myelofibrosis.  Neutrophilia began in March 2015 at 9.6k, slowly increased to 14k in August 2018, 19k in March 2019, now to [place] in September 2019.  3. Polycythemia vera Confirmed by 08/15/12 JAK 2 mutation test  4. History of Metastatic melanoma to the right lung  S/p resection by Dr. Servando Snare on 10/21/12, BRAF mutation not detected. -continue q90monthy skin screening her PCP/dermatologist -symptom directed radiologic scanning.   PLAN: -Discussed pt labwork today, 12/23/18; stable leukocytosis, HCT at 51.5%, PLT at goal at  246k. Chemistries are stable. LDH at 255 -Proceed with therapeutic phlebotomy today and will consider repeat phlebotomy again in 2 months. Goal of HCT <45% -Goal for PLT between 200k and 400k -The pt has no prohibitive toxicities from continuing '1000mg'$  Hydroxyurea MWF and '500mg'$  Hydroxyurea other 4 days per week, at this time.  -Again advised limiting alcohol consumption which the pt does, limiting red meats which the pt  does, avoiding Iron supplements and cooking in cast iron skillets  -Recommend staying well hydrated -Will watch for signs of chronic inflammation -Recommended that the pt continue to abide by sun protection strategies as Hydroxyurea can sensitize the skin  -Continue ASA '81mg'$  po daily  -Counseled the pt towards complete smoking cessation again and explained that this would serve as a cause for increased polycythemia and strokes -has completed dental extractions with Dr. Enrique Sack  -Recommend checking thyroid levels with PCP  -Encouraged her to maintenance compliance with her medications -Recommended that the pt continue to eat well, drink at least 48-64 oz of water each day, and walk 20-30 minutes each day. -Will see the pt back in 2 months   RTC with Dr Irene Limbo with labs and therapeutic phlebotomy in 2 months   All of the patients questions were answered with apparent satisfaction. The patient knows to call the clinic with any problems, questions or concerns.  The total time spent in the appt was 20 minutes and more than 50% was on counseling and direct patient cares.    Sullivan Lone MD MS AAHIVMS [facility] Hematology/Oncology Physician [facility]  (Office):       [phone] (Work cell):  [phone] (Fax):           [phone]  12/23/2018 12:08 PM  I, Baldwin [place], am acting as a scribe for Dr. Sullivan Lone.   .I have reviewed the above documentation for accuracy and completeness, and I agree with the above. [name] MD

## 2018-12-23 NOTE — Patient Instructions (Signed)
Therapeutic Phlebotomy Therapeutic phlebotomy is the planned removal of blood from a person's body for the purpose of treating a medical condition. The procedure is similar to donating blood. Usually, about a pint (470 mL, or 0.47 L) of blood is removed. The average adult has 9-12 pints (4.3-5.7 L) of blood in the body. Therapeutic phlebotomy may be used to treat the following medical conditions:  Hemochromatosis. This is a condition in which the blood contains too much iron.  Polycythemia vera. This is a condition in which the blood contains too many red blood cells.  Porphyria cutanea tarda. This is a disease in which an important part of hemoglobin is not made properly. It results in the buildup of abnormal amounts of porphyrins in the body.  Sickle cell disease. This is a condition in which the red blood cells form an abnormal crescent shape rather than a round shape. Tell a health care provider about:  Any allergies you have.  All medicines you are taking, including vitamins, herbs, eye drops, creams, and over-the-counter medicines.  Any problems you or family members have had with anesthetic medicines.  Any blood disorders you have.  Any surgeries you have had.  Any medical conditions you have.  Whether you are pregnant or may be pregnant. What are the risks? Generally, this is a safe procedure. However, problems may occur, including:  Nausea or light-headedness.  Low blood pressure (hypotension).  Soreness, bleeding, swelling, or bruising at the needle insertion site.  Infection. What happens before the procedure?  Follow instructions from your health care provider about eating or drinking restrictions.  Ask your health care provider about: ? Changing or stopping your regular medicines. This is especially important if you are taking diabetes medicines or blood thinners (anticoagulants). ? Taking medicines such as aspirin and ibuprofen. These medicines can thin your  blood. Do not take these medicines unless your health care provider tells you to take them. ? Taking over-the-counter medicines, vitamins, herbs, and supplements.  Wear clothing with sleeves that can be raised above the elbow.  Plan to have someone take you home from the hospital or clinic.  You may have a blood sample taken.  Your blood pressure, pulse rate, and breathing rate will be measured. What happens during the procedure?   To lower your risk of infection: ? Your health care team will wash or sanitize their hands. ? Your skin will be cleaned with an antiseptic.  You may be given a medicine to numb the area (local anesthetic).  A tourniquet will be placed on your arm.  A needle will be inserted into one of your veins.  Tubing and a collection bag will be attached to that needle.  Blood will flow through the needle and tubing into the collection bag.  The collection bag will be placed lower than your arm to allow gravity to help the flow of blood into the bag.  You may be asked to open and close your hand slowly and continually during the entire collection.  After the specified amount of blood has been removed from your body, the collection bag and tubing will be clamped.  The needle will be removed from your vein.  Pressure will be held on the site of the needle insertion to stop the bleeding.  A bandage (dressing) will be placed over the needle insertion site. The procedure may vary among health care providers and hospitals. What happens after the procedure?  Your blood pressure, pulse rate, and breathing rate will be   measured after the procedure.  You will be encouraged to drink fluids.  Your recovery will be assessed and monitored.  You can return to your normal activities as told by your health care provider. Summary  Therapeutic phlebotomy is the planned removal of blood from a person's body for the purpose of treating a medical condition.  Therapeutic  phlebotomy may be used to treat hemochromatosis, polycythemia vera, porphyria cutanea tarda, or sickle cell disease.  In the procedure, a needle is inserted and about a pint (470 mL, or 0.47 L) of blood is removed. The average adult has 9-12 pints (4.3-5.7 L) of blood in the body.  This is generally a safe procedure, but it can sometimes cause problems such as nausea, light-headedness, or low blood pressure (hypotension). This information is not intended to replace advice given to you by your health care provider. Make sure you discuss any questions you have with your health care provider. Document Released: 11/17/2010 Document Revised: 07/01/2017 Document Reviewed: 07/01/2017 [employer]vier Interactive Patient Education  2019 [employer]  

## 2018-12-23 NOTE — Progress Notes (Signed)
Patient choosing to not stay full 30 minute post observation period post phlebotomy. Vital signs 12 minutes post were stable and patient reported feeling well. Patient discharged to home.

## 2018-12-24 LAB — CLIENT EDUCATION TRACKING

## 2018-12-24 LAB — HEMOGLOBIN A1C
Hgb A1c MFr Bld: 6.1 % of total Hgb — ABNORMAL HIGH (ref <5.7)
Mean Plasma Glucose: 128 (calc)
eAG (mmol/L): 7.1 (calc)

## 2018-12-24 LAB — LIPID PANEL
Cholesterol: 149 mg/dL (ref <200)
HDL: 33 mg/dL — ABNORMAL LOW (ref >=50)
LDL Cholesterol (Calc): 84 mg/dL (calc)
Non-HDL Cholesterol (Calc): 116 mg/dL (calc) (ref <130)
Total CHOL/HDL Ratio: 4.5 (calc) (ref <5.0)
Triglycerides: 233 mg/dL — ABNORMAL HIGH (ref <150)

## 2018-12-26 ENCOUNTER — Telehealth: Payer: Self-pay | Admitting: Hematology

## 2018-12-26 ENCOUNTER — Encounter: Payer: Self-pay | Admitting: *Deleted

## 2018-12-26 NOTE — Telephone Encounter (Signed)
Scheduled appt per 6/26 los. Printed and mailed calendar.

## 2019-01-03 ENCOUNTER — Other Ambulatory Visit: Payer: Self-pay | Admitting: Hematology

## 2019-01-03 DIAGNOSIS — D471 Chronic myeloproliferative disease: Secondary | ICD-10-CM

## 2019-01-03 DIAGNOSIS — D45 Polycythemia vera: Secondary | ICD-10-CM

## 2019-01-20 ENCOUNTER — Other Ambulatory Visit: Payer: Self-pay | Admitting: Family Medicine

## 2019-02-24 ENCOUNTER — Other Ambulatory Visit: Payer: Self-pay

## 2019-02-24 ENCOUNTER — Telehealth: Payer: Self-pay | Admitting: Hematology

## 2019-02-24 DIAGNOSIS — D45 Polycythemia vera: Secondary | ICD-10-CM

## 2019-02-24 NOTE — Telephone Encounter (Signed)
Called pt per 8/31 sch message - unable to reach pt  Left message for patient to call back to reschedule.

## 2019-02-24 NOTE — Telephone Encounter (Signed)
Called pt a second time - no answer  .left message for pt to call back again

## 2019-02-27 ENCOUNTER — Inpatient Hospital Stay: Payer: Medicare HMO | Admitting: Hematology

## 2019-02-27 ENCOUNTER — Inpatient Hospital Stay: Payer: Medicare HMO

## 2019-02-27 ENCOUNTER — Inpatient Hospital Stay: Payer: Medicare HMO | Attending: Hematology

## 2019-03-01 ENCOUNTER — Other Ambulatory Visit: Payer: Self-pay | Admitting: Family Medicine

## 2019-03-05 ENCOUNTER — Other Ambulatory Visit: Payer: Self-pay | Admitting: Family Medicine

## 2019-03-12 ENCOUNTER — Other Ambulatory Visit: Payer: Self-pay | Admitting: Family Medicine

## 2019-03-22 ENCOUNTER — Other Ambulatory Visit: Payer: Self-pay | Admitting: Family Medicine

## 2019-03-28 ENCOUNTER — Other Ambulatory Visit: Payer: Self-pay | Admitting: Family Medicine

## 2019-05-08 ENCOUNTER — Other Ambulatory Visit: Payer: Self-pay | Admitting: Family Medicine

## 2019-05-10 ENCOUNTER — Other Ambulatory Visit: Payer: Self-pay

## 2019-05-10 MED ORDER — VALACYCLOVIR HCL 1 G PO TABS: 1000.0000 mg | ORAL_TABLET | Freq: Two times a day (BID) | ORAL | 0 refills | Status: DC | PRN

## 2019-05-12 ENCOUNTER — Other Ambulatory Visit: Payer: Self-pay | Admitting: Hematology

## 2019-05-12 DIAGNOSIS — D471 Chronic myeloproliferative disease: Secondary | ICD-10-CM

## 2019-05-12 DIAGNOSIS — D45 Polycythemia vera: Secondary | ICD-10-CM

## 2019-05-15 ENCOUNTER — Other Ambulatory Visit: Payer: Self-pay

## 2019-05-15 MED ORDER — GABAPENTIN 100 MG PO CAPS: ORAL_CAPSULE | ORAL | 1 refills | Status: DC

## 2019-05-15 MED ORDER — LISINOPRIL 2.5 MG PO TABS: 2.5000 mg | ORAL_TABLET | Freq: Every day | ORAL | 0 refills | Status: DC

## 2019-05-19 ENCOUNTER — Telehealth: Payer: Self-pay | Admitting: Hematology

## 2019-05-19 NOTE — Telephone Encounter (Signed)
Returned patient's phone call regarding rescheduling missed August appointments, informed patient I will need further assistance from provider and nurse before rescheduling. Patient will receive a call back.

## 2019-05-23 ENCOUNTER — Telehealth: Payer: Self-pay | Admitting: *Deleted

## 2019-05-23 ENCOUNTER — Telehealth: Payer: Self-pay | Admitting: Hematology

## 2019-05-23 ENCOUNTER — Other Ambulatory Visit: Payer: Self-pay | Admitting: *Deleted

## 2019-05-23 DIAGNOSIS — D45 Polycythemia vera: Secondary | ICD-10-CM

## 2019-05-23 NOTE — Telephone Encounter (Signed)
Called patient regarding rescheduled August appointments, patient is notified.

## 2019-05-23 NOTE — Telephone Encounter (Signed)
Received call from patient.   Reports that she was seen at pain clinic today for routine F/U. Reports that Dr. Donovan Kail wanted patient to F/U with PCP for facial discoloration. States that she had fall on 11/22 and hit face on ground. States that she broke her nose, but it is in the same place it was broken previously. Reports that she has no difficulty breathing or pain from fall.   States that she does not think she needs to be seen as there is nothing that can be done for the discoloration or fx.   MD made aware.

## 2019-05-23 NOTE — Telephone Encounter (Signed)
Noted pt declines OV

## 2019-05-23 NOTE — Telephone Encounter (Signed)
[name] was not able to refill Hydroxyurea as patient has not ben seen for 4 months. He asked that she be scheduled for lab/MD appt in 2-3 weeks if patient is agreeable.  Patient notified that MD cannot fill Hydroxyurea without appt and is agreeable to appts. She spoke with scheduling to arrange appts - per message from scheduling, patient can only come at specific times between 10:30am and 11:30 am. They are trying to find open appt with Dr.Kale that accommodates those times.  Contacted patient to update. She verbalized understanding.

## 2019-05-29 ENCOUNTER — Other Ambulatory Visit: Payer: Self-pay

## 2019-05-29 MED ORDER — DULOXETINE HCL 60 MG PO CPEP: 120.0000 mg | ORAL_CAPSULE | Freq: Every day | ORAL | 2 refills | Status: DC

## 2019-06-12 ENCOUNTER — Other Ambulatory Visit: Payer: Self-pay

## 2019-06-12 ENCOUNTER — Other Ambulatory Visit: Payer: Medicare HMO

## 2019-06-12 ENCOUNTER — Inpatient Hospital Stay: Payer: Medicare HMO | Attending: Hematology

## 2019-06-12 ENCOUNTER — Inpatient Hospital Stay: Payer: Medicare HMO

## 2019-06-12 ENCOUNTER — Inpatient Hospital Stay: Payer: Medicare HMO | Admitting: Hematology

## 2019-06-12 ENCOUNTER — Telehealth: Payer: Self-pay | Admitting: Hematology

## 2019-06-12 VITALS — BP 138/57 | HR 87 | Temp 98.3°F | Resp 18 | Ht 68.0 in | Wt 214.5 lb

## 2019-06-12 DIAGNOSIS — D72829 Elevated white blood cell count, unspecified: Secondary | ICD-10-CM | POA: Insufficient documentation

## 2019-06-12 DIAGNOSIS — Z85820 Personal history of malignant melanoma of skin: Secondary | ICD-10-CM | POA: Insufficient documentation

## 2019-06-12 DIAGNOSIS — Z7982 Long term (current) use of aspirin: Secondary | ICD-10-CM | POA: Insufficient documentation

## 2019-06-12 DIAGNOSIS — D75839 Thrombocytosis, unspecified: Secondary | ICD-10-CM

## 2019-06-12 DIAGNOSIS — D45 Polycythemia vera: Secondary | ICD-10-CM

## 2019-06-12 DIAGNOSIS — D473 Essential (hemorrhagic) thrombocythemia: Secondary | ICD-10-CM

## 2019-06-12 DIAGNOSIS — I10 Essential (primary) hypertension: Secondary | ICD-10-CM | POA: Insufficient documentation

## 2019-06-12 DIAGNOSIS — F17210 Nicotine dependence, cigarettes, uncomplicated: Secondary | ICD-10-CM | POA: Diagnosis not present

## 2019-06-12 DIAGNOSIS — Z79899 Other long term (current) drug therapy: Secondary | ICD-10-CM | POA: Insufficient documentation

## 2019-06-12 DIAGNOSIS — Z90710 Acquired absence of both cervix and uterus: Secondary | ICD-10-CM | POA: Diagnosis not present

## 2019-06-12 DIAGNOSIS — D471 Chronic myeloproliferative disease: Secondary | ICD-10-CM

## 2019-06-12 LAB — CBC WITH DIFFERENTIAL (CANCER CENTER ONLY)
Abs Immature Granulocytes: 0.82 10*3/uL — ABNORMAL HIGH (ref 0.00–0.07)
Basophils Absolute: 0.3 10*3/uL — ABNORMAL HIGH (ref 0.0–0.1)
Basophils Relative: 1 %
Eosinophils Absolute: 0.7 10*3/uL — ABNORMAL HIGH (ref 0.0–0.5)
Eosinophils Relative: 2 %
HCT: 49 % — ABNORMAL HIGH (ref 36.0–46.0)
Hemoglobin: 14.3 g/dL (ref 12.0–15.0)
Immature Granulocytes: 3 %
Lymphocytes Relative: 7 %
Lymphs Abs: 2.2 10*3/uL (ref 0.7–4.0)
MCH: 22.2 pg — ABNORMAL LOW (ref 26.0–34.0)
MCHC: 29.2 g/dL — ABNORMAL LOW (ref 30.0–36.0)
MCV: 76.2 fL — ABNORMAL LOW (ref 80.0–100.0)
Monocytes Absolute: 0.9 10*3/uL (ref 0.1–1.0)
Monocytes Relative: 3 %
Neutro Abs: 26.3 10*3/uL — ABNORMAL HIGH (ref 1.7–7.7)
Neutrophils Relative %: 84 %
Platelet Count: 1107 10*3/uL (ref 150–400)
RBC: 6.43 MIL/uL — ABNORMAL HIGH (ref 3.87–5.11)
RDW: 21 % — ABNORMAL HIGH (ref 11.5–15.5)
WBC Count: 31.2 10*3/uL — ABNORMAL HIGH (ref 4.0–10.5)
nRBC: 0.5 % — ABNORMAL HIGH (ref 0.0–0.2)

## 2019-06-12 LAB — CMP (CANCER CENTER ONLY)
ALT: 9 U/L (ref 0–44)
AST: 10 U/L — ABNORMAL LOW (ref 15–41)
Albumin: 3.4 g/dL — ABNORMAL LOW (ref 3.5–5.0)
Alkaline Phosphatase: 170 U/L — ABNORMAL HIGH (ref 38–126)
Anion gap: 14 (ref 5–15)
BUN: 19 mg/dL (ref 6–20)
CO2: 26 mmol/L (ref 22–32)
Calcium: 9.6 mg/dL (ref 8.9–10.3)
Chloride: 98 mmol/L (ref 98–111)
Creatinine: 0.75 mg/dL (ref 0.44–1.00)
GFR, Est AFR Am: >60 mL/min (ref >60)
GFR, Estimated: >60 mL/min (ref >60)
Glucose, Bld: 154 mg/dL — ABNORMAL HIGH (ref 70–99)
Potassium: 4.1 mmol/L (ref 3.5–5.1)
Sodium: 138 mmol/L (ref 135–145)
Total Bilirubin: 0.3 mg/dL (ref 0.3–1.2)
Total Protein: 7.7 g/dL (ref 6.5–8.1)

## 2019-06-12 LAB — FERRITIN: Ferritin: 31 ng/mL (ref 11–307)

## 2019-06-12 LAB — LACTATE DEHYDROGENASE: LDH: 371 U/L — ABNORMAL HIGH (ref 98–192)

## 2019-06-12 MED ORDER — HYDROXYUREA 500 MG PO CAPS: ORAL_CAPSULE | ORAL | 2 refills | Status: DC

## 2019-06-12 NOTE — Progress Notes (Signed)
HEMATOLOGY/ONCOLOGY CLINIC NOTE  Date of Service: 06/12/2019  Patient Care Team: [facility], [place]ena Nunnery, MD as PCP - General (Family Medicine) Fanny Bien, MD as Attending Physician (Family Medicine) [name], MD as Attending Physician (Cardiothoracic Surgery) [name], MD as Consulting Physician (Hematology)  CHIEF COMPLAINTS/PURPOSE OF CONSULTATION:  Continue mx of Jak2 mutation positive MPN  HISTORY OF PRESENTING ILLNESS:   Katelyn Cline is a wonderful 58 y.o. female who has been referred to [place] by Dr. Tana Coast  for evaluation and management of Leukocytosis. The pt reports that she is doing well overall.   The pt presented to the ED on 03/11/18 with significant weakness, hyperkalemia, and increasing leukocytosis with her PCP. She also notes that she had abscesses and facial infections about 4 weeks ago and received Clindamycin. She then developed a vaginal yeast infection, treated with Monistat and worsened to lesions and sores. The pt notes that since beginning antibiotics her cough has improved.   Patient report having a h/o melanoma metastatic to lung s/pwedge resection in 2014 . No recurrence since then. She notes she was been monitored with CT scans since then.  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  Patient notes that she previous needed therapeutic phlebotomy along time back but  denies ever taking medication for her polycythemia vera.   The pt notes that she used to sleep with a CPAP for many years. She then began treatment for her narcolepsy. The pt notes that she is on O2 and has remained on O2 since her surgery in 2014 to resect the melanoma. She has degenerative disc disease, fibromyalgia, and neuropathy in her feet.   The pt notes that she feels less "groggy-headed"  today.   Most recent lab results (03/14/18) of CBC and BMP is as follows: all values are WNL except for WBC at 31.0k, RBC at 6.97, HCT at 47.1, MCV at 67.6, MCH at 19.7, MCHC at 29.1, RDW at 22.3, PLT at 542k, Glucose at 138.  On review of systems, pt reports recent infections, feeling tired, moving her bowels well, resolved cough and denies abdominal pains, problems passing urine, and any other symptoms.   On PMHx the pt reports basal cell carcinoma in 2013, metastatic melanoma to lung wedge resected with thoracoscopy on 10/21/12.  Interval History:   Katelyn Cline returns today for management and evaluation of her polycythemia vera. The patient's last visit with [place] was on 12/23/2018. The pt reports that she is doing well overall.  The pt reports that she is currently using continuous oxygen. She has a vaginal infection, not sure what kind, and was prescribed something to treat by her OBGYN. This medication caused an episode of vertigo that caused her to fall on her face and break her nose. She was using oxygen during this episode. Pt had a CT scan performed 1-2 weeks after the fall and nothing abnormal was found. Her vaginal infection has not yet cleared and she is planning on setting up an appointment with her OBGYN. She has been having clear vaginal discharge that is sometimes accompanied by blood if she wipes hard. She is also experiencing vaginal itching. Pt was recently diagnosed with Herpes.  Pt also lost her service dog in the interim. He had cancer and was not able to be saved.  She has not been taking her Hydroxyurea due to missed appointments. Pt has been drinking a fair amount of water and has continued to take her baby Asprin.   Lab results today (06/12/19) of CBC w/diff and CMP is as follows: all values are WNL except for WBC at 31.2K, RBC at 6.43, HCT at 49.0, MCV at 76.2, MCH at 22.2, MCHC at 29.2, RDW at 21.0, PLT at 1,107K, nRBC Rel at 0.5K, Neutro Abs at 26.3K, Eos Abs at  0.7K, Baso Abs at 0.3K, Abs Immature Granlulocytes at 0.82K, Glucose at 154, Albumin at 3.4, AST at 10, Alkaline Phosphatase at 170. 06/12/2019 LDH at 371 06/12/2019 Ferritin at 31  On review of systems, pt reports vaginal itching/discharge and denies abdominal pain, leg swelling and any other symptoms.   MEDICAL HISTORY:  Past Medical History:  Diagnosis Date  . Agoraphobia   . Arthritis    "all joints"  . Chronic low back pain   . Chronic respiratory failure with hypoxia ([name])    4L  via [place],  followed by pcp,   (09-16-2018  per pt only uses while at home and at night ,  portable oxygen is not working)  . COPD (chronic obstructive pulmonary disease) ([place])   . DDD (degenerative disc disease), lumbosacral   . Dental caries   . DM type 2 (diabetes mellitus, type 2) ([place])    followed by pcp  . Fibromyalgia   . GERD (gastroesophageal reflux disease)    occasional , no meds  . History of basal cell carcinoma (BCC) excision    s/p  Moh's of face/ nose 12/ 2013  . History of palpitations    per pt found to have occaional PVCs  . History of UTI   . HTN (hypertension)   . Hyperlipidemia   . Loose, teeth   . Major depression   . Metastatic melanoma ([place]) 10/18/2012   12 mm posterior right upper lobe pulmonary nodule, max SUV 3.0  s/p  right wedge resection 10-21-2012,  followed by oncologist-- dr Irene Limbo,  no recurrence  . Mixed stress and urge urinary incontinence   . Myeloproliferative neoplasm ([place])   . Narcolepsy   . OSA (obstructive sleep apnea)   . Oxygen dependent    4 L via [place],  per pt portable oxygen not working but does use while at home and at night  . Panic attacks   . Periodontitis   . Peripheral neuropathy    feet  . 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 Pulmonary nodules    bilateral small stable per CT 03-11-2018    . Scoliosis   . Smokers' cough ([place])    per pt productive a little in the morning's  . Wears glasses     SURGICAL HISTORY: Past Surgical History:  Procedure Laterality Date  . ABDOMINAL HYSTERECTOMY  1998  . BREAST LUMPECTOMY Bilateral 1995   benign per pt  . COLONOSCOPY W/ BIOPSIES AND POLYPECTOMY     Hx: of  . CYSTECTOMY  2000   abdominal wall   . DILATION AND CURETTAGE OF UTERUS  yrs ago  . MOHS SURGERY  2013   nose/face  . MULTIPLE EXTRACTIONS WITH ALVEOLOPLASTY N/A 09/19/2018   Procedure: Extraction of tooth #'s 2, 3, 6-14, 18, and 20-30 with alveoloplasty;  Surgeon: [name], DDS;  Location: [facility];  Service: Oral  Surgery;  Laterality: N/A;  GENERAL WITH NASAL TUBE  . VIDEO ASSISTED THORACOSCOPY (VATS)/WEDGE RESECTION Right 10/21/2012   Procedure: VIDEO ASSISTED THORACOSCOPY (VATS)/WEDGE RESECTION;  Surgeon: [name], MD;  Location: [place];  Service: Thoracic;  Laterality: Right;  Marland Kitchen VIDEO BRONCHOSCOPY N/A 10/21/2012   Procedure: VIDEO BRONCHOSCOPY;  Surgeon: [name], MD;  Location: [facility] OR;  Service: Thoracic;  Laterality: N/A;    SOCIAL HISTORY: Social History   Socioeconomic History  . Marital status: Divorced    Spouse name: Not on file  . Number of children: Not on file  . Years of education: 12+  . Highest education level: Not on file  Occupational History  . Not on file  Tobacco Use  . Smoking status: Current Every Day Smoker    Packs/day: 1.00    Years: 50.00    Pack years: 50.00    Types: Cigarettes  . Smokeless tobacco: Never Used  Substance and Sexual Activity  . Alcohol use: Yes    Comment: occasional  . Drug use: No  . Sexual activity: Not on file  Other Topics Concern  . Not on file  Social History Narrative  . Not on file   Social Determinants of Health   Financial Resource Strain:   . Difficulty of Paying Living Expenses: Not on file  Food Insecurity:   . Worried About Charity fundraiser in the Last Year: Not  on file  . Ran Out of Food in the Last Year: Not on file  Transportation Needs:   . Lack of Transportation (Medical): Not on file  . Lack of Transportation (Non-Medical): Not on file  Physical Activity:   . Days of Exercise per Week: Not on file  . Minutes of Exercise per Session: Not on file  Stress:   . Feeling of Stress : Not on file  Social Connections:   . Frequency of Communication with Friends and Family: Not on file  . Frequency of Social Gatherings with Friends and Family: Not on file  . Attends Religious Services: Not on file  . Active Member of Clubs or Organizations: Not on file  . Attends Archivist Meetings: Not on file  . Marital Status: Not on file  Intimate Partner Violence:   . Fear of Current or Ex-Partner: Not on file  . Emotionally Abused: Not on file  . Physically Abused: Not on file  . Sexually Abused: Not on file    FAMILY HISTORY: Family History  Problem Relation Age of Onset  . Arthritis Mother   . Hyperlipidemia Mother   . Depression Mother   . Anxiety disorder Mother   . Dementia Mother   . Hypertension Father   . Hyperlipidemia Father   . Heart disease Father   . Stroke Father   . Dementia Father   . Heart disease Brother   . ADD / ADHD Son   . Alcohol abuse Maternal Grandfather   . Bipolar disorder Neg Hx   . Drug abuse Neg Hx   . OCD Neg Hx   . Paranoid behavior Neg Hx   . Schizophrenia Neg Hx   . Seizures Neg Hx   . Sexual abuse Neg Hx   . Physical abuse Neg Hx     ALLERGIES:  is allergic to contrast media [iodinated diagnostic agents]; erythromycin; flagyl [metronidazole]; latex; other; penicillins; and tetracyclines & related.  MEDICATIONS:  Current Outpatient Medications  Medication Sig Dispense Refill  . amLODipine (NORVASC) 5 MG tablet Take  1 tablet (5 mg total) by mouth daily. (Patient taking differently: Take 5 mg by mouth at bedtime. ) 90 tablet 2  . aspirin EC 81 MG tablet Take 81 mg by mouth daily.    Marland Kitchen  atorvastatin (LIPITOR) 80 MG tablet TAKE 1 TABLET BY MOUTH DAILY AT 6:00 PM 90 tablet 3  . buPROPion (WELLBUTRIN SR) 100 MG 12 hr tablet TAKE 1 TABLET(100 MG) BY MOUTH TWICE DAILY 60 tablet 3  . DULoxetine (CYMBALTA) 60 MG capsule Take 2 capsules (120 mg total) by mouth daily. 60 capsule 2  . fenofibrate (TRICOR) 48 MG tablet TAKE 1 TABLET(48 MG) BY MOUTH DAILY 90 tablet 0  . fish oil-omega-3 fatty acids 1000 MG capsule Take 2 g by mouth 2 (two) times daily.    . fluconazole (DIFLUCAN) 150 MG tablet Take 1 tablet (150 mg total) by mouth every 3 (three) days as needed (for vaginal itching/yeast infection sx). 2 tablet 0  . gabapentin (NEURONTIN) 100 MG capsule TAKE 1 TO 3 CAPSULES(100 TO 300 MG) BY MOUTH THREE TIMES DAILY AS NEEDED FOR NERVE PAIN 180 capsule 1  . Glucose Blood (BLOOD GLUCOSE TEST STRIPS) STRP Please dispense based on insurance and patient preference. Use as directed to monitor FSBS 1x daily. Dx: E11.9. 100 each 3  . hydrochlorothiazide (HYDRODIURIL) 25 MG tablet Take 1 tablet (25 mg total) by mouth daily. 90 tablet 3  . hydroxyurea (HYDREA) 500 MG capsule May take with food to minimize GI side effects. 60 capsule 2  . JARDIANCE 25 MG TABS tablet TAKE 1 TABLET BY MOUTH DAILY 90 tablet 4  . lisinopril (ZESTRIL) 2.5 MG tablet Take 1 tablet (2.5 mg total) by mouth at bedtime. 90 tablet 0  . metFORMIN (GLUCOPHAGE) 1000 MG tablet TAKE 1 TABLET BY MOUTH TWICE DAILY WITH A MEAL 180 tablet 0  . MICROLET LANCETS MISC Please dispense based on patient and insurance preference. Use as directed to monitor  FSBS 1x daily. Dx: E11.9. 100 each 11  . oxyCODONE-acetaminophen (PERCOCET) 7.5-325 MG tablet Take 1 tablet by mouth 4 (four) times daily as needed for severe pain.    . Pseudoeph-Doxylamine-DM-APAP (NYQUIL PO) Take 1-2 capsules by mouth at bedtime as needed (for cold/flu).    Marland Kitchen senna (SENOKOT) 8.6 MG TABS tablet Take 2 tablets by mouth daily as needed for mild constipation.    . valACYclovir  (VALTREX) 1000 MG tablet Take 1 tablet (1,000 mg total) by mouth 2 (two) times daily as needed (yeast infections). 90 tablet 0  . baclofen (LIORESAL) 10 MG tablet Take 10 mg by mouth 3 (three) times daily.     . Carboxymethylcellul-Glycerin (LUBRICATING EYE DROPS OP) Apply 1 application topically daily as needed (dry eyes).     No current facility-administered medications for this visit.    REVIEW OF SYSTEMS:   A 10+ POINT REVIEW OF SYSTEMS WAS OBTAINED including neurology, dermatology, psychiatry, cardiac, respiratory, lymph, extremities, GI, GU, Musculoskeletal, constitutional, breasts, reproductive, HEENT.  All pertinent positives are noted in the HPI.  All others are negative.   PHYSICAL EXAMINATION: ECOG PERFORMANCE STATUS: 2 - Symptomatic, <50% confined to bed  Vitals:   06/12/19 1217  BP: (!) 138/57  Pulse: 87  Resp: 18  Temp: 98.3 F (36.8 C)  SpO2: 96%   Filed Weights   06/12/19 1217  Weight: 214 lb 8 oz (97.3 kg)   .Body mass index is 32.61 kg/m.   Exam was given in a chair   GENERAL:alert, in no acute distress  and comfortable SKIN: no acute rashes, no significant lesions EYES: conjunctiva are pink and non-injected, sclera anicteric OROPHARYNX: MMM, no exudates, no oropharyngeal erythema or ulceration NECK: supple, no JVD LYMPH:  no palpable lymphadenopathy in the cervical, axillary or inguinal regions LUNGS: clear to auscultation b/l with normal respiratory effort HEART: regular rate & rhythm ABDOMEN:  normoactive bowel sounds , non tender, not distended. No palpable hepatosplenomegaly. Extremity: no pedal edema PSYCH: alert & oriented x 3 with fluent speech NEURO: no focal motor/sensory deficits  LABORATORY DATA:  I have reviewed the data as listed  . CBC Latest Ref Rng & Units 06/12/2019 12/23/2018 09/07/2018  WBC 4.0 - 10.5 K/uL 31.2(H) 17.5(H) 20.0(H)  Hemoglobin 12.0 - 15.0 g/dL 14.3 14.2 13.1  Hematocrit 36.0 - 46.0 % 49.0(H) 51.5(H) 47.6(H)    Platelets 150 - 400 K/uL 1,107(HH) 246 464(H)   . CBC    Component Value Date/Time   WBC 31.2 (H) 06/12/2019 1143   WBC 17.5 (H) 12/23/2018 1104   RBC 6.43 (H) 06/12/2019 1143   HGB 14.3 06/12/2019 1143   HCT 49.0 (H) 06/12/2019 1143   PLT 1,107 (HH) 06/12/2019 1143   MCV 76.2 (L) 06/12/2019 1143   MCH 22.2 (L) 06/12/2019 1143   MCHC 29.2 (L) 06/12/2019 1143   RDW 21.0 (H) 06/12/2019 1143   LYMPHSABS 2.2 06/12/2019 1143   MONOABS 0.9 06/12/2019 1143   EOSABS 0.7 (H) 06/12/2019 1143   BASOSABS 0.3 (H) 06/12/2019 1143     . CMP Latest Ref Rng & Units 06/12/2019 12/23/2018 09/07/2018  Glucose 70 - 99 mg/dL 154(H) 130(H) 200(H)  BUN 6 - 20 mg/dL 19 22(H) 23(H)  Creatinine 0.44 - 1.00 mg/dL 0.75 0.80 0.85  Sodium 135 - 145 mmol/L 138 141 144  Potassium 3.5 - 5.1 mmol/L 4.1 4.5 4.2  Chloride 98 - 111 mmol/L 98 103 102  CO2 22 - 32 mmol/L '26 28 29  '$ Calcium 8.9 - 10.3 mg/dL 9.6 9.5 9.5  Total Protein 6.5 - 8.1 g/dL 7.7 7.5 7.7  Total Bilirubin 0.3 - 1.2 mg/dL 0.3 0.3 0.3  Alkaline Phos 38 - 126 U/L 170(H) 150(H) 162(H)  AST 15 - 41 U/L 10(L) 12(L) 10(L)  ALT 0 - 44 U/L '9 12 10   '$ 08/15/12 JAK2 Mutation:     03/15/18 BM Bx:      RADIOGRAPHIC STUDIES: I have personally reviewed the radiological images as listed and agreed with the findings in the report. No results found.  ASSESSMENT & PLAN:   58 y.o. female with  1. Jak2 V617F mutation positive Myeloproliferative syndrome.- BM Bx consistent with Polycythemia Vera  JAK2 mutation identified in 08/15/12 labs   03/15/18 BM Bx revealed results consistent with JAK2 positive polycythemia vera   Current hgb.hct are at 14/47.6 Thrombocytosis PLT 725k  2. Increasing leucocytosis - primarily neutrophilia. - related to Polycythemia vera Patient with recent dental infections/abscesses and respiratory infection. Part of Leukocytosis could be due to his MPN  - PCV vs Myelofibrosis.  Neutrophilia began in March 2015 at 9.6k,  slowly increased to 14k in August 2018, 19k in March 2019, now to [place] in September 2019.  3. Polycythemia vera Confirmed by 08/15/12 JAK 2 mutation test  4. History of Metastatic melanoma to the right lung  S/p resection by Dr. Servando Snare on 10/21/12, BRAF mutation not detected. -continue q73monthy skin screening her PCP/dermatologist -symptom directed radiologic scanning.   PLAN: -Discussed pt labwork today, 06/12/19; all values are WNL except for WBC at 31.2K, RBC  at 6.43, HCT at 49.0, MCV at 76.2, MCH at 22.2, MCHC at 29.2, RDW at 21.0, PLT at 1,107K, nRBC Rel at 0.5K, Neutro Abs at 26.3K, Eos Abs at 0.7K, Baso Abs at 0.3K, Abs Immature Granlulocytes at 0.82K, Glucose at 154, Albumin at 3.4, AST at 10, Alkaline Phosphatase at 170. -Discussed 06/12/2019 LDH at 371 -Discussed 06/12/2019 Ferritin at 31 -Goal of HCT <45% -Goal for PLT between 200k and 400k -Pt has not been taking Hydroxyurea as prescribed -Advised pt that at her current PLT level the chance of stroke is about 25-30% per year.  -Recommended that the pt continue to drink at least 48-64 oz of water each day. -No phlebotomy today due to pt being oxygen dependant with limited tolerance for significant correction of polycythemia. -Plan for therapeutic phlebotomy in 1 month with return visit -Continue ASA '81mg'$  po daily  -Pt has completed dental extractions with Dr. Enrique Sack  -Encouraged her to maintain compliance with her medications  -The pt has no prohibitive toxicities from continuing '1000mg'$  Hydroxyurea MWF and '500mg'$  Hydroxyurea other 4 days per week, at this time.  -Consider switching from [place] with regards to adding to PV and risks of of recurrent UTIs. Advised pt to discuss with PCP. -Refill Hydroxyurea -Will see the pt back in 1 month with labs   FOLLOW UP: RTC with Dr Irene Limbo with labs and appointment for therapeutic phlebotomy in 1 month   The total time spent in the appt was 25 minutes and more than 50% was on  counseling and direct patient cares.  All of the patient's questions were answered with apparent satisfaction. The patient knows to call the clinic with any problems, questions or concerns.    Sullivan Lone MD [place] AAHIVMS [facility] Hematology/Oncology Physician [facility]  (Office):       [phone] (Work cell):  [phone] (Fax):           [phone]  06/12/2019 2:00 PM  I, Yevette Edwards, am acting as a scribe for Dr. Sullivan Lone.   .I have reviewed the above documentation for accuracy and completeness, and I agree with the above. [name] MD

## 2019-06-12 NOTE — Telephone Encounter (Signed)
Scheduled appt per 12/14 los.  Printed calendar and avs.

## 2019-06-16 ENCOUNTER — Ambulatory Visit (INDEPENDENT_AMBULATORY_CARE_PROVIDER_SITE_OTHER): Payer: Medicare HMO | Admitting: Family Medicine

## 2019-06-16 ENCOUNTER — Other Ambulatory Visit: Payer: Self-pay

## 2019-06-16 ENCOUNTER — Encounter: Payer: Self-pay | Admitting: Family Medicine

## 2019-06-16 VITALS — BP 138/84 | HR 82 | Temp 97.9°F | Resp 16 | Ht 68.0 in | Wt 214.0 lb

## 2019-06-16 DIAGNOSIS — B373 Candidiasis of vulva and vagina: Secondary | ICD-10-CM

## 2019-06-16 DIAGNOSIS — E119 Type 2 diabetes mellitus without complications: Secondary | ICD-10-CM | POA: Diagnosis not present

## 2019-06-16 DIAGNOSIS — I700 Atherosclerosis of aorta: Secondary | ICD-10-CM

## 2019-06-16 DIAGNOSIS — Z23 Encounter for immunization: Secondary | ICD-10-CM

## 2019-06-16 DIAGNOSIS — E6609 Other obesity due to excess calories: Secondary | ICD-10-CM

## 2019-06-16 DIAGNOSIS — I10 Essential (primary) hypertension: Secondary | ICD-10-CM

## 2019-06-16 DIAGNOSIS — B372 Candidiasis of skin and nail: Secondary | ICD-10-CM | POA: Diagnosis not present

## 2019-06-16 DIAGNOSIS — J9611 Chronic respiratory failure with hypoxia: Secondary | ICD-10-CM

## 2019-06-16 DIAGNOSIS — E782 Mixed hyperlipidemia: Secondary | ICD-10-CM

## 2019-06-16 DIAGNOSIS — Z6832 Body mass index (BMI) 32.0-32.9, adult: Secondary | ICD-10-CM

## 2019-06-16 DIAGNOSIS — D45 Polycythemia vera: Secondary | ICD-10-CM

## 2019-06-16 DIAGNOSIS — B3731 Acute candidiasis of vulva and vagina: Secondary | ICD-10-CM

## 2019-06-16 LAB — WET PREP FOR TRICH, YEAST, CLUE

## 2019-06-16 MED ORDER — FLUCONAZOLE 100 MG PO TABS: 100.0000 mg | ORAL_TABLET | Freq: Every day | ORAL | 0 refills | Status: DC

## 2019-06-16 MED ORDER — NYSTATIN 100000 UNIT/GM EX POWD: 1.0000 "application " | Freq: Three times a day (TID) | CUTANEOUS | 2 refills | Status: DC

## 2019-06-16 MED ORDER — NYSTATIN 100000 UNIT/GM EX CREA: 1.0000 "application " | TOPICAL_CREAM | Freq: Two times a day (BID) | CUTANEOUS | 2 refills | Status: DC

## 2019-06-16 MED ORDER — VALACYCLOVIR HCL 1 G PO TABS: ORAL_TABLET | ORAL | 0 refills | Status: DC

## 2019-06-16 NOTE — Assessment & Plan Note (Signed)
Blood pressure looks okay today no changes to her medication.  I will obtain renal function.

## 2019-06-16 NOTE — Assessment & Plan Note (Signed)
Diabetes mellitus has been fairly well controlled.  However with the extensiveness of her yeast intertrigo we may need to take her off the Jardiance.  We will recheck her A1c.  Goal is less than 7%.

## 2019-06-16 NOTE — Assessment & Plan Note (Signed)
Followed by hematology 

## 2019-06-16 NOTE — Assessment & Plan Note (Signed)
sHe is on statin drug.  Will obtain direct LDL since she is not fasting.

## 2019-06-16 NOTE — Assessment & Plan Note (Signed)
sHe is maintained on oxygen therapy.

## 2019-06-16 NOTE — Progress Notes (Signed)
Subjective:    Patient ID: Katelyn Cline, female    DOB: Aug 30, 1960, 58 y.o.   MRN: [id number]  Patient presents for Vaginal Irritation (has HSV and has been using Valtrex, had diflucan for yeast- now has rash like area to vaginal area and down perineum- very itchy)  Patient here due to rash in the vaginal area she has itching sensation and redness between the creases of the vagina as well as in her groin extends to her gluteal cleft.  She also still has a rash beneath her pannus states that it has not completely cleared.  She had been taking Valtrex because she did have herpes a year ago she thought that this also treated yeast infection.  Denies any dysuria abdominal pain.  No vaginal bleeding.  No vaginal discharge  She is overdue for check on her diabetes mellitus her last A1c was 6.1% she is on Metformin at 1000 mg twice daily as well as Jardiance 25 mg once a day.  She does not have her meter with her today.  Hypertension she is taking her blood pressure medicines as prescribed  She still followed by [facility] pain clinic.  States that she had a fall after having a dizzy spell which she thought was a side effect of the Valtrex.  This was about a month ago.  States that she had a CT of her head done at [place] to came back normal about 3 weeks ago.  Oxygen dependent she has not had any difficulties with her breathing recently.  Medications reviewed Review Of Systems:  GEN- denies fatigue, fever, weight loss,weakness, recent illness HEENT- denies eye drainage, change in vision, nasal discharge, CVS- denies chest pain, palpitations RESP- denies SOB, cough, wheeze ABD- denies N/V, change in stools, abd pain GU- denies dysuria, hematuria, dribbling, incontinence MSK- + joint pain, muscle aches, injury Neuro- denies headache, dizziness, syncope, seizure activity       Objective:    BP 138/84   Pulse 82   Temp 97.9 F (36.6 C) (Temporal)   Resp 16   Ht 5\' 8"  (1.727 m)    Wt 214 lb (97.1 kg)   SpO2 97% Comment: on 3L/min via [place]  BMI 32.54 kg/m  GEN- NAD, alert and oriented x3 HEENT- PERRL, EOMI, non injected sclera, pink conjunctiva,  Neck- Supple, no thyromegaly CVS- RRR, no murmur RESP-CTAB ABD-NABS,soft,NT,ND GU- normal external genitalia,  Erythema between vaginal folds, with white discharge, erythema inguinal L >R, mild erythema gluteal cleft Skin- erythema with plaque like appearance beneath pannus, no odor noted, few cracks in skin  EXT- No edema Pulses- Radial, DP- 2+        Assessment & Plan:      Problem List Items Addressed This Visit      Unprioritized   Aortic atherosclerosis ([place])    sHe is on statin drug.  Will obtain direct LDL since she is not fasting.      Chronic hypoxemic respiratory failure ([name])    sHe is maintained on oxygen therapy.      Diabetes mellitus type II, controlled ([place])    Diabetes mellitus has been fairly well controlled.  However with the extensiveness of her yeast intertrigo we may need to take her off the Jardiance.  We will recheck her A1c.  Goal is less than 7%.      Relevant Orders   CBC with Differential   Comprehensive metabolic panel   Hemoglobin A1c   Essential hypertension, benign  Blood pressure looks okay today no changes to her medication.  I will obtain renal function.      Hyperlipidemia   Relevant Orders   LDL Cholesterol, Direct   Obesity   Polycythemia vera ([place])    Followed by hematology.      Relevant Medications   fluconazole (DIFLUCAN) 100 MG tablet   valACYclovir (VALTREX) 1000 MG tablet    Other Visit Diagnoses    Intertriginous candidiasis    -  Primary   Chronic infection.  We will place her on Diflucan 100 mg daily for a week.  Will give nystatin cream and powder to use in the intertriginous zones   Relevant Medications   fluconazole (DIFLUCAN) 100 MG tablet   nystatin (MYCOSTATIN/NYSTOP) powder   nystatin cream (MYCOSTATIN)   valACYclovir (VALTREX) 1000  MG tablet   Vaginal yeast infection       Relevant Medications   fluconazole (DIFLUCAN) 100 MG tablet   nystatin (MYCOSTATIN/NYSTOP) powder   nystatin cream (MYCOSTATIN)   valACYclovir (VALTREX) 1000 MG tablet   Other Relevant Orders   WET PREP FOR [place], YEAST, CLUE (Completed)   Need for immunization against influenza       Relevant Orders   Flu Vaccine QUAD 36+ mos IM (Completed)      Note: This dictation was prepared with Dragon dictation along with smaller phrase technology. Any transcriptional errors that result from this process are unintentional.

## 2019-06-16 NOTE — Patient Instructions (Addendum)
F/U 4 months  For yeast infection  Use the topical cream nystatin, take diflucan as prescribed We will call with lab results

## 2019-06-17 LAB — COMPREHENSIVE METABOLIC PANEL
AG Ratio: 1.1 (calc) (ref 1.0–2.5)
ALT: 7 U/L (ref 6–29)
AST: 10 U/L (ref 10–35)
Albumin: 3.6 g/dL (ref 3.6–5.1)
Alkaline phosphatase (APISO): 171 U/L — ABNORMAL HIGH (ref 37–153)
BUN: 22 mg/dL (ref 7–25)
CO2: 23 mmol/L (ref 20–32)
Calcium: 9.4 mg/dL (ref 8.6–10.4)
Chloride: 102 mmol/L (ref 98–110)
Creat: 0.71 mg/dL (ref 0.50–0.99)
Globulin: 3.2 g/dL (calc) (ref 1.9–3.7)
Glucose, Bld: 186 mg/dL — ABNORMAL HIGH (ref 65–99)
Potassium: 5.2 mmol/L (ref 3.5–5.3)
Sodium: 142 mmol/L (ref 135–146)
Total Bilirubin: 0.2 mg/dL (ref 0.2–1.2)
Total Protein: 6.8 g/dL (ref 6.1–8.1)

## 2019-06-17 LAB — CBC WITH DIFFERENTIAL/PLATELET
Absolute Monocytes: 791 cells/uL (ref 200–950)
Basophils Absolute: 205 cells/uL — ABNORMAL HIGH (ref 0–200)
Basophils Relative: 0.7 %
Eosinophils Absolute: 527 cells/uL — ABNORMAL HIGH (ref 15–500)
Eosinophils Relative: 1.8 %
HCT: 45.6 % — ABNORMAL HIGH (ref 35.0–45.0)
Hemoglobin: 13.3 g/dL (ref 11.7–15.5)
Lymphs Abs: 1846 cells/uL (ref 850–3900)
MCH: 22 pg — ABNORMAL LOW (ref 27.0–33.0)
MCHC: 29.2 g/dL — ABNORMAL LOW (ref 32.0–36.0)
MCV: 75.5 fL — ABNORMAL LOW (ref 80.0–100.0)
MPV: 9.7 fL (ref 7.5–12.5)
Monocytes Relative: 2.7 %
Neutro Abs: 25931 cells/uL — ABNORMAL HIGH (ref 1500–7800)
Neutrophils Relative %: 88.5 %
Platelets: 995 10*3/uL — ABNORMAL HIGH (ref 140–400)
RBC: 6.04 10*6/uL — ABNORMAL HIGH (ref 3.80–5.10)
RDW: 19.4 % — ABNORMAL HIGH (ref 11.0–15.0)
Total Lymphocyte: 6.3 %
WBC: 29.3 10*3/uL — ABNORMAL HIGH (ref 3.8–10.8)

## 2019-06-17 LAB — HEMOGLOBIN A1C
Hgb A1c MFr Bld: 6.3 % of total Hgb — ABNORMAL HIGH (ref <5.7)
Mean Plasma Glucose: 134 (calc)
eAG (mmol/L): 7.4 (calc)

## 2019-06-17 LAB — LDL CHOLESTEROL, DIRECT: Direct LDL: 60 mg/dL (ref <100)

## 2019-06-20 ENCOUNTER — Other Ambulatory Visit: Payer: Self-pay | Admitting: *Deleted

## 2019-06-20 MED ORDER — EMPAGLIFLOZIN 10 MG PO TABS: 10.0000 mg | ORAL_TABLET | Freq: Every day | ORAL | 3 refills | Status: DC

## 2019-06-24 ENCOUNTER — Other Ambulatory Visit: Payer: Self-pay | Admitting: Family Medicine

## 2019-06-26 ENCOUNTER — Other Ambulatory Visit: Payer: Self-pay | Admitting: *Deleted

## 2019-06-26 MED ORDER — SITAGLIPTIN PHOSPHATE 100 MG PO TABS: 100.0000 mg | ORAL_TABLET | Freq: Every day | ORAL | 3 refills | Status: DC

## 2019-07-18 NOTE — Progress Notes (Signed)
HEMATOLOGY/ONCOLOGY CLINIC NOTE  Date of Service: 07/19/2019  Patient Care Team: [facility], [place]ena Nunnery, MD as PCP - General (Family Medicine) Fanny Bien, MD as Attending Physician (Family Medicine) [name], MD as Attending Physician (Cardiothoracic Surgery) [name], MD as Consulting Physician (Hematology)  CHIEF COMPLAINTS/PURPOSE OF CONSULTATION:  Continue mx of Jak2 mutation positive MPN  HISTORY OF PRESENTING ILLNESS:   Katelyn Cline is a wonderful 59 y.o. female who has been referred to [place] by Dr. Tana Coast  for evaluation and management of Leukocytosis. The pt reports that she is doing well overall.   The pt presented to the ED on 03/11/18 with significant weakness, hyperkalemia, and increasing leukocytosis with her PCP. She also notes that she had abscesses and facial infections about 4 weeks ago and received Clindamycin. She then developed a vaginal yeast infection, treated with Monistat and worsened to lesions and sores. The pt notes that since beginning antibiotics her cough has improved.   Patient report having a h/o melanoma metastatic to lung s/pwedge resection in 2014 . No recurrence since then. She notes she was been monitored with CT scans since then.  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  Patient notes that she previous needed therapeutic phlebotomy along time back but  denies ever taking medication for her polycythemia vera.   The pt notes that she used to sleep with a CPAP for many years. She then began treatment for her narcolepsy. The pt notes that she is on O2 and has remained on O2 since her surgery in 2014 to resect the melanoma. She has degenerative disc disease, fibromyalgia, and neuropathy in her feet.   The pt notes that she feels less "groggy-headed"  today.   Most recent lab results (03/14/18) of CBC and BMP is as follows: all values are WNL except for WBC at 31.0k, RBC at 6.97, HCT at 47.1, MCV at 67.6, MCH at 19.7, MCHC at 29.1, RDW at 22.3, PLT at 542k, Glucose at 138.  On review of systems, pt reports recent infections, feeling tired, moving her bowels well, resolved cough and denies abdominal pains, problems passing urine, and any other symptoms.   On PMHx the pt reports basal cell carcinoma in 2013, metastatic melanoma to lung wedge resected with thoracoscopy on 10/21/12.  Interval History:  Katelyn Cline returns today for management and evaluation of her polycythemia vera. The patient's last visit with [place] was on 06/12/2019. The pt reports that she is doing well overall.  The pt reports that she was switched from [place] to [place] and does not like this change. Pt has been bloated and constipated. She has also been gaining weight despite having a decrease in appetite. Pt has been taking a stool softener and laxatives to help with constipation. She has continued taking Hydroxyurea as prescribed and has no issues with it. She has not yet registered for the COVID19 vaccine but will do so when it opens up for her age group.   Lab results today (07/19/19) of CBC w/diff and CMP is as follows: all values are WNL except for WBC at 23.3K, RBC at 6.32, HCT at 47.7, MCV at 75.5, MCH at 21.4, MCHC at 28.3, RDW at at 22.0, PLT at 677K, Neutro Abs at 19.4K, Eos Abs at 0.8K, Baso Abs at 0.2K, Abs Immature  Granulocytes at 0.35K, Glucose at 130, Calcium at 8.8, Albumin at 3.0, AST at 8, ALP at 166, Total Bilirubin at 0.2. 07/19/2019 LDH at 308  On review of systems, pt reports constipation, bloating, low appetite and denies leg swelling, chest pain, dental issues, fevers, chills, abdominal pain and any other symptoms.   MEDICAL HISTORY:  Past Medical History:  Diagnosis Date  . Agoraphobia   . Arthritis    "all joints"  . Chronic low back pain     . Chronic respiratory failure with hypoxia ([name])    4L  via [place],  followed by pcp,   (09-16-2018  per pt only uses while at home and at night ,  portable oxygen is not working)  . COPD (chronic obstructive pulmonary disease) ([place])   . DDD (degenerative disc disease), lumbosacral   . Dental caries   . DM type 2 (diabetes mellitus, type 2) ([place])    followed by pcp  . Fibromyalgia   . GERD (gastroesophageal reflux disease)    occasional , no meds  . History of basal cell carcinoma (BCC) excision    s/p  Moh's of face/ nose 12/ 2013  . History of palpitations    per pt found to have occaional PVCs  . History of UTI   . HTN (hypertension)   . Hyperlipidemia   . Loose, teeth   . Major depression   . Metastatic melanoma ([place]) 10/18/2012   12 mm posterior right upper lobe pulmonary nodule, max SUV 3.0  s/p  right wedge resection 10-21-2012,  followed by oncologist-- dr Irene Limbo,  no recurrence  . Mixed stress and urge urinary incontinence   . Myeloproliferative neoplasm ([place])   . Narcolepsy   . OSA (obstructive sleep apnea)   . Oxygen dependent    4 L via [place],  per pt portable oxygen not working but does use while at home and at night  . Panic attacks   . Periodontitis   . Peripheral neuropathy    feet  . 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 Pulmonary nodules    bilateral small stable per CT 03-11-2018  . Scoliosis   . Smokers' cough ([place])    per pt productive a little in the morning's  . Wears glasses     SURGICAL HISTORY: Past Surgical History:  Procedure Laterality Date  . ABDOMINAL HYSTERECTOMY  1998  . BREAST LUMPECTOMY Bilateral 1995   benign per pt  . COLONOSCOPY W/ BIOPSIES AND POLYPECTOMY     Hx: of  . CYSTECTOMY  2000   abdominal wall   . DILATION AND CURETTAGE OF UTERUS  yrs ago  . MOHS SURGERY  2013    nose/face  . MULTIPLE EXTRACTIONS WITH ALVEOLOPLASTY N/A 09/19/2018   Procedure: Extraction of tooth #'s 2, 3, 6-14, 18, and 20-30 with alveoloplasty;  Surgeon: [name], DDS;  Location: [facility];  Service: Oral Surgery;  Laterality: N/A;  GENERAL WITH NASAL TUBE  . VIDEO ASSISTED THORACOSCOPY (VATS)/WEDGE RESECTION Right 10/21/2012   Procedure: VIDEO ASSISTED THORACOSCOPY (VATS)/WEDGE RESECTION;  Surgeon: [name], MD;  Location: [facility];  Service: Thoracic;  Laterality: Right;  Marland Kitchen VIDEO BRONCHOSCOPY N/A 10/21/2012   Procedure: VIDEO BRONCHOSCOPY;  Surgeon: [name], MD;  Location: [facility] OR;  Service: Thoracic;  Laterality: N/A;    SOCIAL HISTORY: Social History   Socioeconomic History  .  Marital status: Divorced    Spouse name: Not on file  . Number of children: Not on file  . Years of education: 12+  . Highest education level: Not on file  Occupational History  . Not on file  Tobacco Use  . Smoking status: Current Every Day Smoker    Packs/day: 1.00    Years: 50.00    Pack years: 50.00    Types: Cigarettes  . Smokeless tobacco: Never Used  Substance and Sexual Activity  . Alcohol use: Yes    Comment: occasional  . Drug use: No  . Sexual activity: Not on file  Other Topics Concern  . Not on file  Social History Narrative  . Not on file   Social Determinants of Health   Financial Resource Strain:   . Difficulty of Paying Living Expenses: Not on file  Food Insecurity:   . Worried About Charity fundraiser in the Last Year: Not on file  . Ran Out of Food in the Last Year: Not on file  Transportation Needs:   . Lack of Transportation (Medical): Not on file  . Lack of Transportation (Non-Medical): Not on file  Physical Activity:   . Days of Exercise per Week: Not on file  . Minutes of Exercise per Session: Not on file  Stress:   . Feeling of Stress : Not on file  Social Connections:   . Frequency of Communication with Friends and Family: Not on file   . Frequency of Social Gatherings with Friends and Family: Not on file  . Attends Religious Services: Not on file  . Active Member of Clubs or Organizations: Not on file  . Attends Archivist Meetings: Not on file  . Marital Status: Not on file  Intimate Partner Violence:   . Fear of Current or Ex-Partner: Not on file  . Emotionally Abused: Not on file  . Physically Abused: Not on file  . Sexually Abused: Not on file    FAMILY HISTORY: Family History  Problem Relation Age of [place]  . Arthritis Mother   . Hyperlipidemia Mother   . Depression Mother   . Anxiety disorder Mother   . Dementia Mother   . Hypertension Father   . Hyperlipidemia Father   . Heart disease Father   . Stroke Father   . Dementia Father   . Heart disease Brother   . ADD / ADHD Son   . Alcohol abuse Maternal Grandfather   . Bipolar disorder Neg Hx   . Drug abuse Neg Hx   . OCD Neg Hx   . Paranoid behavior Neg Hx   . Schizophrenia Neg Hx   . Seizures Neg Hx   . Sexual abuse Neg Hx   . Physical abuse Neg Hx     ALLERGIES:  is allergic to contrast media [iodinated diagnostic agents]; erythromycin; flagyl [metronidazole]; latex; other; penicillins; and tetracyclines & related.  MEDICATIONS:  Current Outpatient Medications  Medication Sig Dispense Refill  . amLODipine (NORVASC) 5 MG tablet Take 1 tablet (5 mg total) by mouth daily. (Patient taking differently: Take 5 mg by mouth at bedtime. ) 90 tablet 2  . aspirin EC 81 MG tablet Take 81 mg by mouth daily.    Marland Kitchen atorvastatin (LIPITOR) 80 MG tablet TAKE 1 TABLET BY MOUTH DAILY AT 6:00 PM 90 tablet 3  . buPROPion (WELLBUTRIN SR) 100 MG 12 hr tablet TAKE 1 TABLET(100 MG) BY MOUTH TWICE DAILY 60 tablet 3  . DULoxetine (CYMBALTA) 60  MG capsule Take 2 capsules (120 mg total) by mouth daily. 60 capsule 2  . fenofibrate (TRICOR) 48 MG tablet TAKE 1 TABLET(48 MG) BY MOUTH DAILY 90 tablet 0  . fish oil-omega-3 fatty acids 1000 MG capsule Take 2 g by  mouth 2 (two) times daily.    Marland Kitchen gabapentin (NEURONTIN) 100 MG capsule TAKE 1 TO 3 CAPSULES(100 TO 300 MG) BY MOUTH THREE TIMES DAILY AS NEEDED FOR NERVE PAIN 180 capsule 1  . Glucose Blood (BLOOD GLUCOSE TEST STRIPS) STRP Please dispense based on insurance and patient preference. Use as directed to monitor FSBS 1x daily. Dx: E11.9. 100 each 3  . hydrochlorothiazide (HYDRODIURIL) 25 MG tablet Take 1 tablet (25 mg total) by mouth daily. 90 tablet 3  . hydroxyurea (HYDREA) 500 MG capsule 2 tabs (1000 mg) PO daily Monday through Friday  and 1 tab ('500mg'$ ) po daily on Saturday and Sunday. May take with food to minimize GI side effects. 60 capsule 3  . lisinopril (ZESTRIL) 2.5 MG tablet Take 1 tablet (2.5 mg total) by mouth at bedtime. 90 tablet 0  . metFORMIN (GLUCOPHAGE) 1000 MG tablet TAKE 1 TABLET BY MOUTH TWICE DAILY WITH A MEAL 180 tablet 0  . MICROLET LANCETS MISC Please dispense based on patient and insurance preference. Use as directed to monitor  FSBS 1x daily. Dx: E11.9. 100 each 11  . nystatin (MYCOSTATIN/NYSTOP) powder Apply 1 application topically 3 (three) times daily. 60 g 2  . nystatin cream (MYCOSTATIN) Apply 1 application topically 2 (two) times daily. To affected areas 60 g 2  . oxyCODONE-acetaminophen (PERCOCET) 7.5-325 MG tablet Take 1 tablet by mouth 4 (four) times daily as needed for severe pain.    . Pseudoeph-Doxylamine-DM-APAP (NYQUIL PO) Take 1-2 capsules by mouth at bedtime as needed (for cold/flu).    Marland Kitchen senna (SENOKOT) 8.6 MG TABS tablet Take 2 tablets by mouth daily as needed for mild constipation.    . sitaGLIPtin (JANUVIA) 100 MG tablet Take 1 tablet (100 mg total) by mouth daily. 30 tablet 3  . valACYclovir (VALTREX) 1000 MG tablet 1 tab BID prn outbreak 90 tablet 0   No current facility-administered medications for this visit.    REVIEW OF SYSTEMS:   A 10+ POINT REVIEW OF SYSTEMS WAS OBTAINED including neurology, dermatology, psychiatry, cardiac, respiratory, lymph,  extremities, GI, GU, Musculoskeletal, constitutional, breasts, reproductive, HEENT.  All pertinent positives are noted in the HPI.  All others are negative.   PHYSICAL EXAMINATION: ECOG PERFORMANCE STATUS: 2 - Symptomatic, <50% confined to bed  Vitals:   07/19/19 1047  BP: (!) 132/58  Pulse: 65  Resp: 18  Temp: (!) 97.5 F (36.4 C)  SpO2: 100%   Filed Weights   07/19/19 1047  Weight: 221 lb 1.6 oz (100.3 kg)   .Body mass index is 33.62 kg/m.   Exam was given in a chair   GENERAL:alert, in no acute distress and comfortable SKIN: no acute rashes, no significant lesions EYES: conjunctiva are pink and non-injected, sclera anicteric OROPHARYNX: MMM, no exudates, no oropharyngeal erythema or ulceration NECK: supple, no JVD LYMPH:  no palpable lymphadenopathy in the cervical, axillary or inguinal regions LUNGS: clear to auscultation b/l with normal respiratory effort HEART: regular rate & rhythm ABDOMEN:  normoactive bowel sounds , non tender, not distended. No palpable hepatosplenomegaly.  Extremity: no pedal edema PSYCH: alert & oriented x 3 with fluent speech NEURO: no focal motor/sensory deficits  LABORATORY DATA:  I have reviewed the data as listed  .  CBC Latest Ref Rng & Units 07/19/2019 06/16/2019 06/12/2019  WBC 4.0 - 10.5 K/uL 23.3(H) 29.3(H) 31.2(H)  Hemoglobin 12.0 - 15.0 g/dL 13.5 13.3 14.3  Hematocrit 36.0 - 46.0 % 47.7(H) 45.6(H) 49.0(H)  Platelets 150 - 400 K/uL 677(H) 995(H) 1,107(HH)   . CBC    Component Value Date/Time   WBC 23.3 (H) 07/19/2019 1026   RBC 6.32 (H) 07/19/2019 1026   HGB 13.5 07/19/2019 1026   HGB 14.3 06/12/2019 1143   HCT 47.7 (H) 07/19/2019 1026   PLT 677 (H) 07/19/2019 1026   PLT 1,107 (HH) 06/12/2019 1143   MCV 75.5 (L) 07/19/2019 1026   MCH 21.4 (L) 07/19/2019 1026   MCHC 28.3 (L) 07/19/2019 1026   RDW 22.0 (H) 07/19/2019 1026   LYMPHSABS 2.0 07/19/2019 1026   MONOABS 0.6 07/19/2019 1026   EOSABS 0.8 (H) 07/19/2019 1026    BASOSABS 0.2 (H) 07/19/2019 1026     . CMP Latest Ref Rng & Units 07/19/2019 06/16/2019 06/12/2019  Glucose 70 - 99 mg/dL 130(H) 186(H) 154(H)  BUN 6 - 20 mg/dL '15 22 19  '$ Creatinine 0.44 - 1.00 mg/dL 0.73 0.71 0.75  Sodium 135 - 145 mmol/L 141 142 138  Potassium 3.5 - 5.1 mmol/L 4.3 5.2 4.1  Chloride 98 - 111 mmol/L 104 102 98  CO2 22 - 32 mmol/L '27 23 26  '$ Calcium 8.9 - 10.3 mg/dL 8.8(L) 9.4 9.6  Total Protein 6.5 - 8.1 g/dL 7.2 6.8 7.7  Total Bilirubin 0.3 - 1.2 mg/dL 0.2(L) 0.2 0.3  Alkaline Phos 38 - 126 U/L 166(H) - 170(H)  AST 15 - 41 U/L 8(L) 10 10(L)  ALT 0 - 44 U/L '8 7 9   '$ 08/15/12 JAK2 Mutation:     03/15/18 BM Bx:      RADIOGRAPHIC STUDIES: I have personally reviewed the radiological images as listed and agreed with the findings in the report. No results found.  ASSESSMENT & PLAN:   59 y.o. female with  1. Jak2 V617F mutation positive Myeloproliferative syndrome.- BM Bx consistent with Polycythemia Vera  JAK2 mutation identified in 08/15/12 labs   03/15/18 BM Bx revealed results consistent with JAK2 positive polycythemia vera   Current hgb.hct are at 14/47.6 Thrombocytosis PLT 725k  2. Increasing leucocytosis - primarily neutrophilia. - related to Polycythemia vera Patient with recent dental infections/abscesses and respiratory infection. Part of Leukocytosis could be due to his MPN  - PCV vs Myelofibrosis.  Neutrophilia began in March 2015 at 9.6k, slowly increased to 14k in August 2018, 19k in March 2019, now to [place] in September 2019.  3. Polycythemia vera Confirmed by 08/15/12 JAK 2 mutation test  4. History of Metastatic melanoma to the right lung  S/p resection by Dr. Servando Snare on 10/21/12, BRAF mutation not detected. -continue q70monthy skin screening her PCP/dermatologist -symptom directed radiologic scanning.   PLAN: -Discussed pt labwork today, 07/19/19; PLT are trending in the right direction, other blood counts are steady, blood chemistries  are stable -Discussed LDH is stable at 308 -Goal of HCT <45% - at 47.7 on 07/19/19 -Goal for PLT between 200k and 400k - at 677K on 07/19/19 -No phlebotomy today due to pt being oxygen dependant with limited tolerance for significant correction of polycythemia. Hgb is WNL -The pt has no prohibitive toxicities from continuing '1000mg'$  Hydroxyurea M-F and '500mg'$  Hydroxyurea on the weekends, at this time -Encouraged her to maintain compliance with her medications  -Recommend pt receive COVID19 vaccine when available -Continue ASA '81mg'$  po daily  -  Refill Hydroxyurea  -Will see the pt back in 6 weeks with labs   FOLLOW UP: RTC with Dr Irene Limbo with labs in 6 weeks   The total time spent in the appt was 20 minutes and more than 50% was on counseling and direct patient cares.  All of the patient's questions were answered with apparent satisfaction. The patient knows to call the clinic with any problems, questions or concerns.    Sullivan Lone MD [place] AAHIVMS [facility] Hematology/Oncology Physician [facility]  (Office):       [phone] (Work cell):  [phone] (Fax):           [phone]  07/19/2019 11:49 AM  I, Yevette Edwards, am acting as a scribe for Dr. Sullivan Lone.   .I have reviewed the above documentation for accuracy and completeness, and I agree with the above. [name] MD

## 2019-07-19 ENCOUNTER — Inpatient Hospital Stay: Payer: Medicare HMO

## 2019-07-19 ENCOUNTER — Inpatient Hospital Stay: Payer: Medicare HMO | Attending: Hematology

## 2019-07-19 ENCOUNTER — Inpatient Hospital Stay: Payer: Medicare HMO | Admitting: Hematology

## 2019-07-19 ENCOUNTER — Other Ambulatory Visit: Payer: Self-pay

## 2019-07-19 VITALS — BP 132/58 | HR 65 | Temp 97.5°F | Resp 18 | Ht 68.0 in | Wt 221.1 lb

## 2019-07-19 DIAGNOSIS — Z7982 Long term (current) use of aspirin: Secondary | ICD-10-CM | POA: Insufficient documentation

## 2019-07-19 DIAGNOSIS — D72829 Elevated white blood cell count, unspecified: Secondary | ICD-10-CM | POA: Insufficient documentation

## 2019-07-19 DIAGNOSIS — Z7984 Long term (current) use of oral hypoglycemic drugs: Secondary | ICD-10-CM | POA: Insufficient documentation

## 2019-07-19 DIAGNOSIS — Z79899 Other long term (current) drug therapy: Secondary | ICD-10-CM | POA: Insufficient documentation

## 2019-07-19 DIAGNOSIS — E119 Type 2 diabetes mellitus without complications: Secondary | ICD-10-CM | POA: Insufficient documentation

## 2019-07-19 DIAGNOSIS — Z85820 Personal history of malignant melanoma of skin: Secondary | ICD-10-CM | POA: Insufficient documentation

## 2019-07-19 DIAGNOSIS — D471 Chronic myeloproliferative disease: Secondary | ICD-10-CM

## 2019-07-19 DIAGNOSIS — F17210 Nicotine dependence, cigarettes, uncomplicated: Secondary | ICD-10-CM | POA: Insufficient documentation

## 2019-07-19 DIAGNOSIS — D473 Essential (hemorrhagic) thrombocythemia: Secondary | ICD-10-CM

## 2019-07-19 DIAGNOSIS — D45 Polycythemia vera: Secondary | ICD-10-CM

## 2019-07-19 DIAGNOSIS — I10 Essential (primary) hypertension: Secondary | ICD-10-CM | POA: Diagnosis not present

## 2019-07-19 DIAGNOSIS — D75839 Thrombocytosis, unspecified: Secondary | ICD-10-CM

## 2019-07-19 LAB — CMP (CANCER CENTER ONLY)
ALT: 8 U/L (ref 0–44)
AST: 8 U/L — ABNORMAL LOW (ref 15–41)
Albumin: 3 g/dL — ABNORMAL LOW (ref 3.5–5.0)
Alkaline Phosphatase: 166 U/L — ABNORMAL HIGH (ref 38–126)
Anion gap: 10 (ref 5–15)
BUN: 15 mg/dL (ref 6–20)
CO2: 27 mmol/L (ref 22–32)
Calcium: 8.8 mg/dL — ABNORMAL LOW (ref 8.9–10.3)
Chloride: 104 mmol/L (ref 98–111)
Creatinine: 0.73 mg/dL (ref 0.44–1.00)
GFR, Est AFR Am: >60 mL/min (ref >60)
GFR, Estimated: >60 mL/min (ref >60)
Glucose, Bld: 130 mg/dL — ABNORMAL HIGH (ref 70–99)
Potassium: 4.3 mmol/L (ref 3.5–5.1)
Sodium: 141 mmol/L (ref 135–145)
Total Bilirubin: 0.2 mg/dL — ABNORMAL LOW (ref 0.3–1.2)
Total Protein: 7.2 g/dL (ref 6.5–8.1)

## 2019-07-19 LAB — CBC WITH DIFFERENTIAL/PLATELET
Abs Immature Granulocytes: 0.35 10*3/uL — ABNORMAL HIGH (ref 0.00–0.07)
Basophils Absolute: 0.2 10*3/uL — ABNORMAL HIGH (ref 0.0–0.1)
Basophils Relative: 1 %
Eosinophils Absolute: 0.8 10*3/uL — ABNORMAL HIGH (ref 0.0–0.5)
Eosinophils Relative: 3 %
HCT: 47.7 % — ABNORMAL HIGH (ref 36.0–46.0)
Hemoglobin: 13.5 g/dL (ref 12.0–15.0)
Immature Granulocytes: 2 %
Lymphocytes Relative: 8 %
Lymphs Abs: 2 10*3/uL (ref 0.7–4.0)
MCH: 21.4 pg — ABNORMAL LOW (ref 26.0–34.0)
MCHC: 28.3 g/dL — ABNORMAL LOW (ref 30.0–36.0)
MCV: 75.5 fL — ABNORMAL LOW (ref 80.0–100.0)
Monocytes Absolute: 0.6 10*3/uL (ref 0.1–1.0)
Monocytes Relative: 3 %
Neutro Abs: 19.4 10*3/uL — ABNORMAL HIGH (ref 1.7–7.7)
Neutrophils Relative %: 83 %
Platelets: 677 10*3/uL — ABNORMAL HIGH (ref 150–400)
RBC: 6.32 MIL/uL — ABNORMAL HIGH (ref 3.87–5.11)
RDW: 22 % — ABNORMAL HIGH (ref 11.5–15.5)
WBC: 23.3 10*3/uL — ABNORMAL HIGH (ref 4.0–10.5)
nRBC: 0.2 % (ref 0.0–0.2)

## 2019-07-19 LAB — LACTATE DEHYDROGENASE: LDH: 308 U/L — ABNORMAL HIGH (ref 98–192)

## 2019-07-19 MED ORDER — HYDROXYUREA 500 MG PO CAPS: ORAL_CAPSULE | ORAL | 3 refills | Status: DC

## 2019-07-20 ENCOUNTER — Other Ambulatory Visit: Payer: Self-pay | Admitting: Family Medicine

## 2019-08-20 ENCOUNTER — Other Ambulatory Visit: Payer: Self-pay | Admitting: Family Medicine

## 2019-09-07 NOTE — Progress Notes (Signed)
HEMATOLOGY/ONCOLOGY CLINIC NOTE  Date of Service: 09/07/2019  Patient Care Team: [facility], [place]ena Nunnery, MD as PCP - General (Family Medicine) Fanny Bien, MD as Attending Physician (Family Medicine) [name], MD as Attending Physician (Cardiothoracic Surgery) [name], MD as Consulting Physician (Hematology)  CHIEF COMPLAINTS/PURPOSE OF CONSULTATION:  Continue mx of Jak2 mutation positive MPN  HISTORY OF PRESENTING ILLNESS:   Katelyn Cline is a wonderful 59 y.o. female who has been referred to [place] by Dr. Tana Coast  for evaluation and management of Leukocytosis. The pt reports that she is doing well overall.   The pt presented to the ED on 03/11/18 with significant weakness, hyperkalemia, and increasing leukocytosis with her PCP. She also notes that she had abscesses and facial infections about 4 weeks ago and received Clindamycin. She then developed a vaginal yeast infection, treated with Monistat and worsened to lesions and sores. The pt notes that since beginning antibiotics her cough has improved.   Patient report having a h/o melanoma metastatic to lung s/pwedge resection in 2014 . No recurrence since then. She notes she was been monitored with CT scans since then.  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  Patient notes that she previous needed therapeutic phlebotomy along time back but  denies ever taking medication for her polycythemia vera.   The pt notes that she used to sleep with a CPAP for many years. She then began treatment for her narcolepsy. The pt notes that she is on O2 and has remained on O2 since her surgery in 2014 to resect the melanoma. She has degenerative disc disease, fibromyalgia, and neuropathy in her feet.   The pt notes that she feels less "groggy-headed"  today.   Most recent lab results (03/14/18) of CBC and BMP is as follows: all values are WNL except for WBC at 31.0k, RBC at 6.97, HCT at 47.1, MCV at 67.6, MCH at 19.7, MCHC at 29.1, RDW at 22.3, PLT at 542k, Glucose at 138.  On review of systems, pt reports recent infections, feeling tired, moving her bowels well, resolved cough and denies abdominal pains, problems passing urine, and any other symptoms.   On PMHx the pt reports basal cell carcinoma in 2013, metastatic melanoma to lung wedge resected with thoracoscopy on 10/21/12.  Interval History:   Katelyn Cline returns today for management and evaluation of her polycythemia vera. The patient's last visit with [place] was on 07/19/19. The pt reports that she is doing well overall.  The pt reports she has been having intermittent pain in the upper abdominal pain. She has been taking her hydroxyurea regularly. Pt has not gotten COVID19 vaccine yet. She has been using 2-3 liters of oxygen.   Lab results today (09/08/19) of CBC w/diff and CMP is as follows: all values are WNL except for WBC at 21.2K, RBC at 6.14, HCT at 46.3, MCV at 75.4, MCH at 21.5, MCHC at 28.5, RDW at 23.3, Platelets at 534K, Neutro Abs at 17.6K, Basophils Abs at 0.2K, Abs Immature Granulocytes at 0.17K, Glucose at 119, Calcium at 8.8, Albumin at 3.3, AST at 8, AlkalinePhosphatase at 141   On review of systems, pt reports weight gain, healthy appetite, adequate hydration and denies abdominal pain, infection issues, nausea, vomiting, diarrhea, mouth soars, dental issues and any other symptoms.  MEDICAL HISTORY:  Past Medical History:  Diagnosis Date  . Agoraphobia   . Arthritis    "all joints"  . Chronic low back pain   . Chronic respiratory failure with hypoxia ([name])    4L  via [place],  followed by pcp,   (09-16-2018  per pt only uses while at home and at night ,  portable oxygen is not working)  . COPD (chronic obstructive pulmonary disease) ([place])   . DDD (degenerative  disc disease), lumbosacral   . Dental caries   . DM type 2 (diabetes mellitus, type 2) ([place])    followed by pcp  . Fibromyalgia   . GERD (gastroesophageal reflux disease)    occasional , no meds  . History of basal cell carcinoma (BCC) excision    s/p  Moh's of face/ nose 12/ 2013  . History of palpitations    per pt found to have occaional PVCs  . History of UTI   . HTN (hypertension)   . Hyperlipidemia   . Loose, teeth   . Major depression   . Metastatic melanoma ([place]) 10/18/2012   12 mm posterior right upper lobe pulmonary nodule, max SUV 3.0  s/p  right wedge resection 10-21-2012,  followed by oncologist-- dr Irene Limbo,  no recurrence  . Mixed stress and urge urinary incontinence   . Myeloproliferative neoplasm ([place])   . Narcolepsy   . OSA (obstructive sleep apnea)   . Oxygen dependent    4 L via [place],  per pt portable oxygen not working but does use while at home and at night  . Panic attacks   . Periodontitis   . Peripheral neuropathy    feet  . 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 Pulmonary nodules    bilateral small stable per CT 03-11-2018  . Scoliosis   . Smokers' cough ([place])    per pt productive a little in the morning's  . Wears glasses     SURGICAL HISTORY: Past Surgical History:  Procedure Laterality Date  . ABDOMINAL HYSTERECTOMY  1998  . BREAST LUMPECTOMY Bilateral 1995   benign per pt  . COLONOSCOPY W/ BIOPSIES AND POLYPECTOMY     Hx: of  . CYSTECTOMY  2000   abdominal wall   . DILATION AND CURETTAGE OF UTERUS  yrs ago  . MOHS SURGERY  2013   nose/face  . MULTIPLE EXTRACTIONS WITH ALVEOLOPLASTY N/A 09/19/2018   Procedure: Extraction of tooth #'s 2, 3, 6-14, 18, and 20-30 with alveoloplasty;  Surgeon: [name], DDS;  Location: [facility];  Service: Oral Surgery;  Laterality: N/A;  GENERAL WITH  NASAL TUBE  . VIDEO ASSISTED THORACOSCOPY (VATS)/WEDGE RESECTION Right 10/21/2012   Procedure: VIDEO ASSISTED THORACOSCOPY (VATS)/WEDGE RESECTION;  Surgeon: [name], MD;  Location: [place];  Service: Thoracic;  Laterality: Right;  Marland Kitchen VIDEO BRONCHOSCOPY N/A 10/21/2012   Procedure: VIDEO BRONCHOSCOPY;  Surgeon: [name], MD;  Location: [facility];  Service: Thoracic;  Laterality: N/A;    SOCIAL HISTORY: Social History   Socioeconomic History  . Marital status: Divorced    Spouse name: Not on file  . Number of children: Not on file  . Years of education: 12+  . Highest education level: Not on file  Occupational History  . Not on file  Tobacco Use  . Smoking status: Current Every Day Smoker  Packs/day: 1.00    Years: 50.00    Pack years: 50.00    Types: Cigarettes  . Smokeless tobacco: Never Used  Substance and Sexual Activity  . Alcohol use: Yes    Comment: occasional  . Drug use: No  . Sexual activity: Not on file  Other Topics Concern  . Not on file  Social History Narrative  . Not on file   Social Determinants of Health   Financial Resource Strain:   . Difficulty of Paying Living Expenses:   Food Insecurity:   . Worried About Charity fundraiser in the Last Year:   . Arboriculturist in the Last Year:   Transportation Needs:   . Film/video editor (Medical):   Marland Kitchen Lack of Transportation (Non-Medical):   Physical Activity:   . Days of Exercise per Week:   . Minutes of Exercise per Session:   Stress:   . Feeling of Stress :   Social Connections:   . Frequency of Communication with Friends and Family:   . Frequency of Social Gatherings with Friends and Family:   . Attends Religious Services:   . Active Member of Clubs or Organizations:   . Attends Archivist Meetings:   Marland Kitchen Marital Status:   Intimate Partner Violence:   . Fear of Current or Ex-Partner:   . Emotionally Abused:   Marland Kitchen Physically Abused:   . Sexually Abused:     FAMILY  HISTORY: Family History  Problem Relation Age of Onset  . Arthritis Mother   . Hyperlipidemia Mother   . Depression Mother   . Anxiety disorder Mother   . Dementia Mother   . Hypertension Father   . Hyperlipidemia Father   . Heart disease Father   . Stroke Father   . Dementia Father   . Heart disease Brother   . ADD / ADHD Son   . Alcohol abuse Maternal Grandfather   . Bipolar disorder Neg Hx   . Drug abuse Neg Hx   . OCD Neg Hx   . Paranoid behavior Neg Hx   . Schizophrenia Neg Hx   . Seizures Neg Hx   . Sexual abuse Neg Hx   . Physical abuse Neg Hx     ALLERGIES:  is allergic to contrast media [iodinated diagnostic agents]; erythromycin; flagyl [metronidazole]; latex; other; penicillins; and tetracyclines & related.  MEDICATIONS:  Current Outpatient Medications  Medication Sig Dispense Refill  . amLODipine (NORVASC) 5 MG tablet TAKE 1 TABLET(5 MG) BY MOUTH DAILY 90 tablet 2  . aspirin EC 81 MG tablet Take 81 mg by mouth daily.    Marland Kitchen atorvastatin (LIPITOR) 80 MG tablet TAKE 1 TABLET BY MOUTH DAILY AT 6:00 PM 90 tablet 3  . buPROPion (WELLBUTRIN SR) 100 MG 12 hr tablet TAKE 1 TABLET(100 MG) BY MOUTH TWICE DAILY 60 tablet 3  . DULoxetine (CYMBALTA) 60 MG capsule TAKE 2 CAPSULES(120 MG) BY MOUTH DAILY 60 capsule 2  . fenofibrate (TRICOR) 48 MG tablet TAKE 1 TABLET(48 MG) BY MOUTH DAILY 90 tablet 0  . fish oil-omega-3 fatty acids 1000 MG capsule Take 2 g by mouth 2 (two) times daily.    Marland Kitchen gabapentin (NEURONTIN) 100 MG capsule TAKE 1 TO 3 CAPSULES(100 TO 300 MG) BY MOUTH THREE TIMES DAILY AS NEEDED FOR NERVE PAIN 180 capsule 1  . Glucose Blood (BLOOD GLUCOSE TEST STRIPS) STRP Please dispense based on insurance and patient preference. Use as directed to monitor FSBS 1x daily. Dx: E11.9.  100 each 3  . hydrochlorothiazide (HYDRODIURIL) 25 MG tablet TAKE 1 TABLET(25 MG) BY MOUTH DAILY 90 tablet 3  . hydroxyurea (HYDREA) 500 MG capsule 2 tabs (1000 mg) PO daily Monday through Friday   and 1 tab ('500mg'$ ) po daily on Saturday and Sunday. May take with food to minimize GI side effects. 60 capsule 3  . lisinopril (ZESTRIL) 2.5 MG tablet Take 1 tablet (2.5 mg total) by mouth at bedtime. 90 tablet 0  . metFORMIN (GLUCOPHAGE) 1000 MG tablet TAKE 1 TABLET BY MOUTH TWICE DAILY WITH A MEAL 180 tablet 0  . MICROLET LANCETS MISC Please dispense based on patient and insurance preference. Use as directed to monitor  FSBS 1x daily. Dx: E11.9. 100 each 11  . nystatin (MYCOSTATIN/NYSTOP) powder Apply 1 application topically 3 (three) times daily. 60 g 2  . nystatin cream (MYCOSTATIN) Apply 1 application topically 2 (two) times daily. To affected areas 60 g 2  . oxyCODONE-acetaminophen (PERCOCET) 7.5-325 MG tablet Take 1 tablet by mouth 4 (four) times daily as needed for severe pain.    . Pseudoeph-Doxylamine-DM-APAP (NYQUIL PO) Take 1-2 capsules by mouth at bedtime as needed (for cold/flu).    Marland Kitchen senna (SENOKOT) 8.6 MG TABS tablet Take 2 tablets by mouth daily as needed for mild constipation.    . sitaGLIPtin (JANUVIA) 100 MG tablet Take 1 tablet (100 mg total) by mouth daily. 30 tablet 3  . valACYclovir (VALTREX) 1000 MG tablet 1 tab BID prn outbreak 90 tablet 0   No current facility-administered medications for this visit.    REVIEW OF SYSTEMS:   A 10+ POINT REVIEW OF SYSTEMS WAS OBTAINED including neurology, dermatology, psychiatry, cardiac, respiratory, lymph, extremities, GI, GU, Musculoskeletal, constitutional, breasts, reproductive, HEENT.  All pertinent positives are noted in the HPI.  All others are negative.   PHYSICAL EXAMINATION: ECOG PERFORMANCE STATUS: 2 - Symptomatic, <50% confined to bed  Vitals:   09/08/19 1107  BP: (!) 129/54  Pulse: 78  Resp: 18  Temp: 98.5 F (36.9 C)  SpO2: 95%   Filed Weights   09/08/19 1107  Weight: 222 lb 6.4 oz (100.9 kg)   .Body mass index is 33.82 kg/m.   Exam given in chair   GENERAL:alert, in no acute distress and  comfortable SKIN: no acute rashes, no significant lesions EYES: conjunctiva are pink and non-injected, sclera anicteric OROPHARYNX: MMM, no exudates, no oropharyngeal erythema or ulceration NECK: supple, no JVD LYMPH:  no palpable lymphadenopathy in the cervical, axillary or inguinal regions LUNGS: clear to auscultation b/l with normal respiratory effort HEART: regular rate & rhythm ABDOMEN:  normoactive bowel sounds , non tender, not distended. Extremity: no pedal edema PSYCH: alert & oriented x 3 with fluent speech NEURO: no focal motor/sensory deficits  LABORATORY DATA:  I have reviewed the data as listed  . CBC Latest Ref Rng & Units 07/19/2019 06/16/2019 06/12/2019  WBC 4.0 - 10.5 K/uL 23.3(H) 29.3(H) 31.2(H)  Hemoglobin 12.0 - 15.0 g/dL 13.5 13.3 14.3  Hematocrit 36.0 - 46.0 % 47.7(H) 45.6(H) 49.0(H)  Platelets 150 - 400 K/uL 677(H) 995(H) 1,107(HH)   . CBC    Component Value Date/Time   WBC 23.3 (H) 07/19/2019 1026   RBC 6.32 (H) 07/19/2019 1026   HGB 13.5 07/19/2019 1026   HGB 14.3 06/12/2019 1143   HCT 47.7 (H) 07/19/2019 1026   PLT 677 (H) 07/19/2019 1026   PLT 1,107 (HH) 06/12/2019 1143   MCV 75.5 (L) 07/19/2019 1026   MCH 21.4 (L)  07/19/2019 1026   MCHC 28.3 (L) 07/19/2019 1026   RDW 22.0 (H) 07/19/2019 1026   LYMPHSABS 2.0 07/19/2019 1026   MONOABS 0.6 07/19/2019 1026   EOSABS 0.8 (H) 07/19/2019 1026   BASOSABS 0.2 (H) 07/19/2019 1026     . CMP Latest Ref Rng & Units 07/19/2019 06/16/2019 06/12/2019  Glucose 70 - 99 mg/dL 130(H) 186(H) 154(H)  BUN 6 - 20 mg/dL '15 22 19  '$ Creatinine 0.44 - 1.00 mg/dL 0.73 0.71 0.75  Sodium 135 - 145 mmol/L 141 142 138  Potassium 3.5 - 5.1 mmol/L 4.3 5.2 4.1  Chloride 98 - 111 mmol/L 104 102 98  CO2 22 - 32 mmol/L '27 23 26  '$ Calcium 8.9 - 10.3 mg/dL 8.8(L) 9.4 9.6  Total Protein 6.5 - 8.1 g/dL 7.2 6.8 7.7  Total Bilirubin 0.3 - 1.2 mg/dL 0.2(L) 0.2 0.3  Alkaline Phos 38 - 126 U/L 166(H) - 170(H)  AST 15 - 41 U/L 8(L)  10 10(L)  ALT 0 - 44 U/L '8 7 9   '$ 08/15/12 JAK2 Mutation:     03/15/18 BM Bx:      RADIOGRAPHIC STUDIES: I have personally reviewed the radiological images as listed and agreed with the findings in the report. No results found.  ASSESSMENT & PLAN:   59 y.o. female with  1. Jak2 V617F mutation positive Myeloproliferative syndrome.- BM Bx consistent with Polycythemia Vera  JAK2 mutation identified in 08/15/12 labs   03/15/18 BM Bx revealed results consistent with JAK2 positive polycythemia vera   Current hgb.hct are at 14/47.6 Thrombocytosis PLT 725k  2. Increasing leucocytosis - primarily neutrophilia. - related to Polycythemia vera Patient with recent dental infections/abscesses and respiratory infection. Part of Leukocytosis could be due to his MPN  - PCV vs Myelofibrosis.  Neutrophilia began in March 2015 at 9.6k, slowly increased to 14k in August 2018, 19k in March 2019, now to [place] in September 2019.  3. Polycythemia vera Confirmed by 08/15/12 JAK 2 mutation test  4. History of Metastatic melanoma to the right lung  S/p resection by Dr. Servando Snare on 10/21/12, BRAF mutation not detected. -continue q32monthy skin screening her PCP/dermatologist -symptom directed radiologic scanning.   PLAN: -Discussed pt labwork today, 09/08/19; of CBC w/diff and CMP is as follows: all values are WNL except for WBC at 21.2K, RBC at 6.14, HCT at 46.3, MCV at 75.4, MCH at 21.5, MCHC at 28.5, RDW at 23.3, Platelets at 534K, Neutro Abs at 17.6K, Basophils Abs at 0.2K, Abs Immature Granulocytes at 0.17K, Glucose at 119, Calcium at 8.8, Albumin at 3.3, AST at 8, AlkalinePhosphatase at 141  -Recommends taking COVID19 vaccine when available  -Recommends f/u with dentist about dentures  -Goal of HCT <50%  -Goal for PLT between 200k and 400k - at 677K on 07/19/19 -No phlebotomy today due to pt being oxygen dependant with limited tolerance for significant correction of polycythemia. Hgb is WNL -The  pt has no prohibitive toxicities from continuing '1000mg'$  Hydroxyurea daily -Encouraged her to maintain compliance with her medications  -Will see back in 10 weeks    FOLLOW UP: RTC with Dr KIrene Limbowith labs in 10 weeks    The total time spent in the appt was 15 minutes and more than 50% was on counseling and direct patient cares.  All of the patient's questions were answered with apparent satisfaction. The patient knows to call the clinic with any problems, questions or concerns.    GSullivan LoneMD MS AAHIVMS SBelton Regional Medical CenterCLakeland Community HospitalHematology/Oncology Physician [facility]  [place]  (Office):       [phone] (Work cell):  [phone] (Fax):           [phone]  09/07/2019 4:39 PM  I, Dawayne Cirri am acting as a scribe for Dr. Sullivan Lone.   .I have reviewed the above documentation for accuracy and completeness, and I agree with the above. [name] MD

## 2019-09-08 ENCOUNTER — Telehealth: Payer: Self-pay | Admitting: Hematology

## 2019-09-08 ENCOUNTER — Inpatient Hospital Stay: Payer: Medicare HMO | Attending: Hematology | Admitting: Hematology

## 2019-09-08 ENCOUNTER — Other Ambulatory Visit: Payer: Self-pay

## 2019-09-08 ENCOUNTER — Inpatient Hospital Stay: Payer: Medicare HMO

## 2019-09-08 VITALS — BP 129/54 | HR 78 | Temp 98.5°F | Resp 18 | Ht 68.0 in | Wt 222.4 lb

## 2019-09-08 DIAGNOSIS — D471 Chronic myeloproliferative disease: Secondary | ICD-10-CM | POA: Diagnosis not present

## 2019-09-08 DIAGNOSIS — D45 Polycythemia vera: Secondary | ICD-10-CM | POA: Diagnosis present

## 2019-09-08 DIAGNOSIS — D75839 Thrombocytosis, unspecified: Secondary | ICD-10-CM

## 2019-09-08 DIAGNOSIS — D473 Essential (hemorrhagic) thrombocythemia: Secondary | ICD-10-CM

## 2019-09-08 LAB — CBC WITH DIFFERENTIAL/PLATELET
Abs Immature Granulocytes: 0.17 10*3/uL — ABNORMAL HIGH (ref 0.00–0.07)
Basophils Absolute: 0.2 10*3/uL — ABNORMAL HIGH (ref 0.0–0.1)
Basophils Relative: 1 %
Eosinophils Absolute: 0.5 10*3/uL (ref 0.0–0.5)
Eosinophils Relative: 3 %
HCT: 46.3 % — ABNORMAL HIGH (ref 36.0–46.0)
Hemoglobin: 13.2 g/dL (ref 12.0–15.0)
Immature Granulocytes: 1 %
Lymphocytes Relative: 9 %
Lymphs Abs: 1.9 10*3/uL (ref 0.7–4.0)
MCH: 21.5 pg — ABNORMAL LOW (ref 26.0–34.0)
MCHC: 28.5 g/dL — ABNORMAL LOW (ref 30.0–36.0)
MCV: 75.4 fL — ABNORMAL LOW (ref 80.0–100.0)
Monocytes Absolute: 0.7 10*3/uL (ref 0.1–1.0)
Monocytes Relative: 3 %
Neutro Abs: 17.6 10*3/uL — ABNORMAL HIGH (ref 1.7–7.7)
Neutrophils Relative %: 83 %
Platelets: 534 10*3/uL — ABNORMAL HIGH (ref 150–400)
RBC: 6.14 MIL/uL — ABNORMAL HIGH (ref 3.87–5.11)
RDW: 23.3 % — ABNORMAL HIGH (ref 11.5–15.5)
WBC: 21.2 10*3/uL — ABNORMAL HIGH (ref 4.0–10.5)
nRBC: 0.1 % (ref 0.0–0.2)

## 2019-09-08 LAB — CMP (CANCER CENTER ONLY)
ALT: <6 U/L (ref 0–44)
AST: 8 U/L — ABNORMAL LOW (ref 15–41)
Albumin: 3.3 g/dL — ABNORMAL LOW (ref 3.5–5.0)
Alkaline Phosphatase: 141 U/L — ABNORMAL HIGH (ref 38–126)
Anion gap: 9 (ref 5–15)
BUN: 16 mg/dL (ref 6–20)
CO2: 27 mmol/L (ref 22–32)
Calcium: 8.8 mg/dL — ABNORMAL LOW (ref 8.9–10.3)
Chloride: 101 mmol/L (ref 98–111)
Creatinine: 0.73 mg/dL (ref 0.44–1.00)
GFR, Est AFR Am: >60 mL/min (ref >60)
GFR, Estimated: >60 mL/min (ref >60)
Glucose, Bld: 119 mg/dL — ABNORMAL HIGH (ref 70–99)
Potassium: 4.1 mmol/L (ref 3.5–5.1)
Sodium: 137 mmol/L (ref 135–145)
Total Bilirubin: 0.4 mg/dL (ref 0.3–1.2)
Total Protein: 7.1 g/dL (ref 6.5–8.1)

## 2019-09-08 MED ORDER — HYDROXYUREA 500 MG PO CAPS: 1000.0000 mg | ORAL_CAPSULE | Freq: Every day | ORAL | 3 refills | Status: DC

## 2019-09-08 NOTE — Telephone Encounter (Signed)
Scheduled per 03/12 los, patient is notified.

## 2019-09-19 ENCOUNTER — Other Ambulatory Visit: Payer: Self-pay | Admitting: Family Medicine

## 2019-10-16 ENCOUNTER — Other Ambulatory Visit: Payer: Self-pay

## 2019-10-16 ENCOUNTER — Ambulatory Visit (INDEPENDENT_AMBULATORY_CARE_PROVIDER_SITE_OTHER): Payer: Medicare HMO | Admitting: Family Medicine

## 2019-10-16 ENCOUNTER — Encounter: Payer: Self-pay | Admitting: Family Medicine

## 2019-10-16 VITALS — BP 138/74 | HR 80 | Temp 98.2°F | Resp 16 | Ht 68.0 in | Wt 224.0 lb

## 2019-10-16 DIAGNOSIS — I700 Atherosclerosis of aorta: Secondary | ICD-10-CM

## 2019-10-16 DIAGNOSIS — R2681 Unsteadiness on feet: Secondary | ICD-10-CM

## 2019-10-16 DIAGNOSIS — J9611 Chronic respiratory failure with hypoxia: Secondary | ICD-10-CM

## 2019-10-16 DIAGNOSIS — Z85820 Personal history of malignant melanoma of skin: Secondary | ICD-10-CM

## 2019-10-16 DIAGNOSIS — I10 Essential (primary) hypertension: Secondary | ICD-10-CM | POA: Diagnosis not present

## 2019-10-16 DIAGNOSIS — G894 Chronic pain syndrome: Secondary | ICD-10-CM

## 2019-10-16 DIAGNOSIS — E119 Type 2 diabetes mellitus without complications: Secondary | ICD-10-CM | POA: Diagnosis not present

## 2019-10-16 DIAGNOSIS — G6289 Other specified polyneuropathies: Secondary | ICD-10-CM

## 2019-10-16 NOTE — Patient Instructions (Addendum)
COVID Vaccination Information As of right now, we will not be giving COVID-19 vaccines here in our office. It is too many storage and administrating regulations that our office is not equipped to provide at this time.    You can go online at [email or web address]   That website will give you information on all counties.    If not here are the numbers you can call. [place] or [place] [phone] opt.2 [facility] Department [phone]   You can also find information on [email or web address] or call the state's COVID-19 information phone number at [phone].   You can also find information in regards to local pharmacies at: [email or web address]  Glucose monitor sent to pharmacy   Referral to dermatology for your skin check   Lift chair   F/U 4 months  For physical

## 2019-10-16 NOTE — Assessment & Plan Note (Addendum)
Goal is A1c less than 7%.  She prefers to go back on Jardiance and deal with the yeast infections as her blood sugars have been trending up the Januvia and Metformin combination.  We will likely start this as long as her renal function is okay.  We will send in a new meter.  She is to check her blood sugar twice a day

## 2019-10-16 NOTE — Assessment & Plan Note (Signed)
Oxygen therapy unchanged. Respiratory failure gait instability operative prescription for lift chair to help with mobility.

## 2019-10-16 NOTE — Progress Notes (Signed)
Subjective:    Patient ID: Katelyn Cline, female    DOB: 1960/11/09, 59 y.o.   MRN: [id number]  Patient presents for Follow-up (is not fasting)   Pt here to f/u chronic medical problems  DM- A1C  6.3% in December , started on Januvia in December still taking but it caused severe constipation, she wants to go back to [place], she is till on metformin   CBG 150-220 fasting, did not bring meter   Constipation- resolved with senakot   Polycythemia Vera followed by hematology.  She is still on hydroxycuea 1000mg  once a day   She is now walking a little more, has a new dog.  She does have difficulty getting out of her regular recliner in office as the.  She uses her cane to help aid her.  She requests a lift chair  [facility]- chronic pain , she had her robaxin increased at her last visit, still on percocet four times a day   Review Of Systems:  GEN- denies fatigue, fever, weight loss,weakness, recent illness HEENT- denies eye drainage, change in vision, nasal discharge, CVS- denies chest pain, palpitations RESP- denies SOB, cough, wheeze ABD- denies N/V, change in stools, abd pain GU- denies dysuria, hematuria, dribbling, incontinence MSK- + joint pain, muscle aches, injury Neuro- denies headache, dizziness, syncope, seizure activity       Objective:    BP 138/74   Pulse 80   Temp 98.2 F (36.8 C) (Temporal)   Resp 16   Ht 5\' 8"  (1.727 m)   Wt 224 lb (101.6 kg)   SpO2 95% Comment: 3L/min via [place]  BMI 34.06 kg/m  GEN- NAD, alert and oriented x3 HEENT- PERRL, EOMI, non injected sclera, pink conjunctiva, MMM, oropharynx clear Neck- Supple, no thyromegaly CVS- RRR, no murmur RESP-CTAB ABD-NABS,soft,NT,ND EXT- No edema Pulses- Radial, DP- 2+        Assessment & Plan:      Problem List Items Addressed This Visit      Unprioritized   Aortic atherosclerosis ([place])    He is on statin drug LDL has been at goal      Relevant Orders   Lipid panel   Chronic hypoxemic respiratory failure ([name])    Oxygen therapy unchanged. Respiratory failure gait instability operative prescription for lift chair to help with mobility.      Chronic pain disorder   Relevant Medications   methocarbamol (ROBAXIN) 750 MG tablet   Diabetes mellitus type II, controlled ([place])    Goal is A1c less than 7%.  She prefers to go back on Jardiance and deal with the yeast infections as her blood sugars have been trending up the Januvia and Metformin combination.  We will likely start this as long as her renal function is okay.  We will send in a new meter.  She is to check her blood sugar twice a day      Relevant Orders   Hemoglobin A1c   Lipid panel   Essential hypertension, benign - Primary    Blood pressure is controlled no change in medication.      History of malignant melanoma    Referral back to dermatology for skin check.      Relevant Orders   Ambulatory referral to Dermatology   Peripheral neuropathy   Relevant Medications   methocarbamol (ROBAXIN) 750 MG tablet    Other Visit Diagnoses    Gait instability          Note: This dictation  was prepared with Dragon dictation along with smaller phrase technology. Any transcriptional errors that result from this process are unintentional.

## 2019-10-16 NOTE — Assessment & Plan Note (Signed)
He is on statin drug LDL has been at goal

## 2019-10-16 NOTE — Assessment & Plan Note (Signed)
Referral back to dermatology for skin check.

## 2019-10-16 NOTE — Assessment & Plan Note (Signed)
Blood pressure is controlled no change in medication. 

## 2019-10-17 LAB — LIPID PANEL
Cholesterol: 158 mg/dL (ref <200)
HDL: 29 mg/dL — ABNORMAL LOW (ref >=50)
LDL Cholesterol (Calc): 87 mg/dL (calc)
Non-HDL Cholesterol (Calc): 129 mg/dL (calc) (ref <130)
Total CHOL/HDL Ratio: 5.4 (calc) — ABNORMAL HIGH (ref <5.0)
Triglycerides: 345 mg/dL — ABNORMAL HIGH (ref <150)

## 2019-10-17 LAB — HEMOGLOBIN A1C
Hgb A1c MFr Bld: 6 % of total Hgb — ABNORMAL HIGH (ref <5.7)
Mean Plasma Glucose: 126 (calc)
eAG (mmol/L): 7 (calc)

## 2019-10-18 ENCOUNTER — Telehealth: Payer: Self-pay | Admitting: *Deleted

## 2019-10-18 ENCOUNTER — Other Ambulatory Visit: Payer: Self-pay | Admitting: Family Medicine

## 2019-10-18 ENCOUNTER — Other Ambulatory Visit: Payer: Self-pay | Admitting: *Deleted

## 2019-10-18 MED ORDER — JARDIANCE 25 MG PO TABS: 25.0000 mg | ORAL_TABLET | Freq: Every day | ORAL | 3 refills | Status: DC

## 2019-10-18 MED ORDER — BLOOD GLUCOSE TEST VI STRP: ORAL_STRIP | 1 refills | Status: DC

## 2019-10-18 MED ORDER — BLOOD GLUCOSE SYSTEM PAK KIT: PACK | 1 refills | Status: DC

## 2019-10-18 MED ORDER — FENOFIBRATE 145 MG PO TABS: 145.0000 mg | ORAL_TABLET | Freq: Every day | ORAL | 3 refills | Status: DC

## 2019-10-18 MED ORDER — MICROLET LANCETS MISC: 11 refills | Status: DC

## 2019-10-18 NOTE — Telephone Encounter (Signed)
-----   Message from [name], MD sent at 10/16/2019  5:31 PM EDT ----- Regarding: Pt needs new meter, test strips, lancets, check BD

## 2019-10-27 ENCOUNTER — Other Ambulatory Visit: Payer: Self-pay | Admitting: Family Medicine

## 2019-10-30 ENCOUNTER — Ambulatory Visit (INDEPENDENT_AMBULATORY_CARE_PROVIDER_SITE_OTHER): Payer: Medicare HMO | Admitting: Nurse Practitioner

## 2019-10-30 ENCOUNTER — Other Ambulatory Visit: Payer: Self-pay

## 2019-10-30 ENCOUNTER — Telehealth: Payer: Self-pay | Admitting: *Deleted

## 2019-10-30 ENCOUNTER — Encounter: Payer: Self-pay | Admitting: Nurse Practitioner

## 2019-10-30 VITALS — BP 124/72 | HR 68 | Temp 97.6°F | Resp 17 | Wt 223.4 lb

## 2019-10-30 DIAGNOSIS — R2681 Unsteadiness on feet: Secondary | ICD-10-CM | POA: Diagnosis not present

## 2019-10-30 DIAGNOSIS — M79671 Pain in right foot: Secondary | ICD-10-CM | POA: Diagnosis not present

## 2019-10-30 DIAGNOSIS — G894 Chronic pain syndrome: Secondary | ICD-10-CM

## 2019-10-30 DIAGNOSIS — G6289 Other specified polyneuropathies: Secondary | ICD-10-CM

## 2019-10-30 MED ORDER — DICLOFENAC SODIUM 1 % EX GEL: 2.0000 g | Freq: Two times a day (BID) | CUTANEOUS | 0 refills | Status: DC | PRN

## 2019-10-30 NOTE — Patient Instructions (Signed)
Your right foot pain is most likely related to your musckuloskeletal strain secondary to chronic deconditioning with h/o polyneuropathy, abnormal gait, chronic pain disorder. You should complete and xray of the right foot to be sure that you do not have an acute injury. Related to the caution that you have stated your regular PCP has encouraged with  Avoiding NSAID's, instead of Ibuprofen or NSAID'S by mouth you may take tylenol/acetamenophen as needed for pain. In addition I have prescribed Voltaren gel to be used sparingly as this is a NSAID used topically. I have ordered a xray of right foot to evaluate for injury.   I have wrapped the foot to include figure [place] the ankle for stability for compression. RICE is encouraged: REST, ICE, Compression, Elevation.   As discussed if sxs become worse or do not resolve, skin becomes red, increased pain, numb, tingling, discolored, change in temperature, you develop fever or chills seek urgent medical attention.   Follow Up: for symptoms of DVT such as change in color or temperature of skin, numbness or tingling, dark hot red spot on the calf. Also for non resolving or worsening symptoms.

## 2019-10-30 NOTE — Telephone Encounter (Signed)
Received call from patient.   Reports that she has x3 days of pain/ edema to R foot. Reports that pain began in toes, but has since moved to top of foot and around ankle. States that there is swelling in area as well.   States that she did take IBU 800mg  x1 for pain. Reports that due to decreased kidney function, she does not want to take more NSAID's.   Appointment scheduled for evaluation.

## 2019-10-30 NOTE — Progress Notes (Signed)
Acute Office Visit  Subjective:    Patient ID: Katelyn Cline, female    DOB: 1960-10-16, 59 y.o.   MRN: [id number]  Chief Complaint: Reports that she has x3 days of pain/ edema to R foot.   HPI: Patient is a 59 year old female that is in today for reports that she has x3 days of pain/ edema to R foot. Reports that pain began in toes, but has since moved to top of foot and around ankle. States that there is swelling in area as well. States that she did take IBU '800mg'$  x1 for pain. Reports that due to decreased kidney function, she does not want to take more NSAID's. She denied injury. She has tried no other treatments. She admits she has had this pain before with known h/o arthritis, fibromyalgia. No numbness, tingling, decreased sensation to the right foot. Today the edema has resolved. The pain is only with palpation.   No cp/ct, gu/gi sxs, sob, or recent falls.   Past Medical History:  Diagnosis Date  . Agoraphobia   . Arthritis    "all joints"  . Chronic low back pain   . Chronic respiratory failure with hypoxia ([name])    4L  via [place],  followed by pcp,   (09-16-2018  per pt only uses while at home and at night ,  portable oxygen is not working)  . COPD (chronic obstructive pulmonary disease) ([place])   . DDD (degenerative disc disease), lumbosacral   . Dental caries   . DM type 2 (diabetes mellitus, type 2) ([place])    followed by pcp  . Fibromyalgia   . GERD (gastroesophageal reflux disease)    occasional , no meds  . History of basal cell carcinoma (BCC) excision    s/p  Moh's of face/ nose 12/ 2013  . History of palpitations    per pt found to have occaional PVCs  . History of UTI   . HTN (hypertension)   . Hyperlipidemia   . Loose, teeth   . Major depression   . Metastatic melanoma ([place]) 10/18/2012   12 mm posterior right upper lobe pulmonary nodule, max SUV 3.0  s/p  right wedge resection 10-21-2012,  followed by oncologist-- dr Irene Limbo,  no recurrence  . Mixed stress and urge  urinary incontinence   . Myeloproliferative neoplasm ([place])   . Narcolepsy   . OSA (obstructive sleep apnea)   . Oxygen dependent    4 L via [place],  per pt portable oxygen not working but does use while at home and at night  . Panic attacks   . Periodontitis   . Peripheral neuropathy    feet  . 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 Pulmonary nodules    bilateral small stable per CT 03-11-2018  . Scoliosis   . Smokers' cough ([place])    per pt productive a little in the morning's  . Wears glasses     Past Surgical History:  Procedure Laterality Date  . ABDOMINAL HYSTERECTOMY  1998  . BREAST LUMPECTOMY Bilateral 1995   benign per pt  . COLONOSCOPY W/ BIOPSIES AND POLYPECTOMY     Hx: of  . CYSTECTOMY  2000   abdominal wall   . DILATION AND CURETTAGE OF UTERUS  yrs ago  . MOHS SURGERY  2013   nose/face  . MULTIPLE EXTRACTIONS WITH  ALVEOLOPLASTY N/A 09/19/2018   Procedure: Extraction of tooth #'s 2, 3, 6-14, 18, and 20-30 with alveoloplasty;  Surgeon: [name], DDS;  Location: [facility];  Service: Oral Surgery;  Laterality: N/A;  GENERAL WITH NASAL TUBE  . VIDEO ASSISTED THORACOSCOPY (VATS)/WEDGE RESECTION Right 10/21/2012   Procedure: VIDEO ASSISTED THORACOSCOPY (VATS)/WEDGE RESECTION;  Surgeon: [name], MD;  Location: [place];  Service: Thoracic;  Laterality: Right;  Marland Kitchen VIDEO BRONCHOSCOPY N/A 10/21/2012   Procedure: VIDEO BRONCHOSCOPY;  Surgeon: [name], MD;  Location: [facility] OR;  Service: Thoracic;  Laterality: N/A;    Family History  Problem Relation Age of Onset  . Arthritis Mother   . Hyperlipidemia Mother   . Depression Mother   . Anxiety disorder Mother   . Dementia Mother   . Hypertension Father   . Hyperlipidemia Father   . Heart disease Father   . Stroke Father   . Dementia Father   . Heart  disease Brother   . ADD / ADHD Son   . Alcohol abuse Maternal Grandfather   . Bipolar disorder Neg Hx   . Drug abuse Neg Hx   . OCD Neg Hx   . Paranoid behavior Neg Hx   . Schizophrenia Neg Hx   . Seizures Neg Hx   . Sexual abuse Neg Hx   . Physical abuse Neg Hx     Social History   Socioeconomic History  . Marital status: Divorced    Spouse name: Not on file  . Number of children: Not on file  . Years of education: 12+  . Highest education level: Not on file  Occupational History  . Not on file  Tobacco Use  . Smoking status: Current Every Day Smoker    Packs/day: 1.00    Years: 50.00    Pack years: 50.00    Types: Cigarettes  . Smokeless tobacco: Never Used  Substance and Sexual Activity  . Alcohol use: Yes    Comment: occasional  . Drug use: No  . Sexual activity: Not on file  Other Topics Concern  . Not on file  Social History Narrative  . Not on file   Social Determinants of Health   Financial Resource Strain:   . Difficulty of Paying Living Expenses:   Food Insecurity:   . Worried About Charity fundraiser in the Last Year:   . Arboriculturist in the Last Year:   Transportation Needs:   . Film/video editor (Medical):   Marland Kitchen Lack of Transportation (Non-Medical):   Physical Activity:   . Days of Exercise per Week:   . Minutes of Exercise per Session:   Stress:   . Feeling of Stress :   Social Connections:   . Frequency of Communication with Friends and Family:   . Frequency of Social Gatherings with Friends and Family:   . Attends Religious Services:   . Active Member of Clubs or Organizations:   . Attends Archivist Meetings:   Marland Kitchen Marital Status:   Intimate Partner Violence:   . Fear of Current or Ex-Partner:   . Emotionally Abused:   Marland Kitchen Physically Abused:   . Sexually Abused:     Outpatient Medications Prior to Visit  Medication Sig Dispense Refill  . amLODipine (NORVASC) 5 MG tablet TAKE 1 TABLET(5 MG) BY MOUTH DAILY 90 tablet 2   . aspirin EC 81 MG tablet Take 81 mg by mouth daily.    Marland Kitchen atorvastatin (LIPITOR) 80  MG tablet TAKE 1 TABLET BY MOUTH DAILY AT 6:00 PM 90 tablet 3  . Blood Glucose Monitoring Suppl (BLOOD GLUCOSE SYSTEM PAK) KIT Please dispense based on patient and insurance preference. Use as directed to monitor FSBS 2x daily. Dx: E11.9 1 kit 1  . buPROPion (WELLBUTRIN SR) 100 MG 12 hr tablet TAKE 1 TABLET(100 MG) BY MOUTH TWICE DAILY 60 tablet 3  . DULoxetine (CYMBALTA) 60 MG capsule TAKE 2 CAPSULES(120 MG) BY MOUTH DAILY 60 capsule 2  . empagliflozin (JARDIANCE) 25 MG TABS tablet Take 25 mg by mouth daily before breakfast. 90 tablet 3  . fenofibrate (TRICOR) 145 MG tablet Take 1 tablet (145 mg total) by mouth daily. 90 tablet 3  . fish oil-omega-3 fatty acids 1000 MG capsule Take 2 g by mouth 2 (two) times daily.    Marland Kitchen gabapentin (NEURONTIN) 100 MG capsule TAKE 1 TO 3 CAPSULES(100 TO 300 MG) BY MOUTH THREE TIMES DAILY AS NEEDED FOR NERVE PAIN 180 capsule 1  . Glucose Blood (BLOOD GLUCOSE TEST STRIPS) STRP Please dispense based on patient and insurance preference. Use as directed to monitor FSBS 2x daily. Dx: E11.9 100 strip 1  . hydrochlorothiazide (HYDRODIURIL) 25 MG tablet TAKE 1 TABLET(25 MG) BY MOUTH DAILY 90 tablet 3  . hydroxyurea (HYDREA) 500 MG capsule Take 2 capsules (1,000 mg total) by mouth daily. May take with food to minimize GI side effects. 60 capsule 3  . lisinopril (ZESTRIL) 2.5 MG tablet TAKE 1 TABLET(2.5 MG) BY MOUTH AT BEDTIME 90 tablet 0  . metFORMIN (GLUCOPHAGE) 1000 MG tablet TAKE 1 TABLET BY MOUTH TWICE DAILY WITH A MEAL 180 tablet 0  . methocarbamol (ROBAXIN) 750 MG tablet     . Microlet Lancets MISC Please dispense based on patient and insurance preference. Use as directed to monitor FSBS 2x daily. Dx: E11.9 100 each 11  . nystatin (MYCOSTATIN/NYSTOP) powder Apply 1 application topically 3 (three) times daily. 60 g 2  . nystatin cream (MYCOSTATIN) Apply 1 application topically 2 (two)  times daily. To affected areas 60 g 2  . oxyCODONE-acetaminophen (PERCOCET) 7.5-325 MG tablet Take 1 tablet by mouth 4 (four) times daily as needed for severe pain.    Marland Kitchen senna (SENOKOT) 8.6 MG TABS tablet Take 2 tablets by mouth daily as needed for mild constipation.    . Pseudoeph-Doxylamine-DM-APAP (NYQUIL PO) Take 1-2 capsules by mouth at bedtime as needed (for cold/flu).    . valACYclovir (VALTREX) 1000 MG tablet 1 tab BID prn outbreak (Patient not taking: Reported on 10/30/2019) 90 tablet 0   No facility-administered medications prior to visit.    Allergies  Allergen Reactions  . Contrast Media [Iodinated Diagnostic Agents] Anaphylaxis  . Erythromycin     Stomach pain  . Flagyl [Metronidazole] Other (See Comments)    Generalized pain.  . Latex Other (See Comments)    Was told to be careful because of Dye allergy  . Other     Iodine Dye  . Penicillins Other (See Comments)    Patient was an infant, no idea of reaction. Tolerates Keflex. Did it involve swelling of the face/tongue/throat, SOB, or low BP? Unknown Did it involve sudden or severe rash/hives, skin peeling, or any reaction on the inside of your mouth or nose? Unknown Did you need to seek medical attention at a hospital or doctor's office? Unknown When did it last happen?infant If all above answers are "NO", may proceed with cephalosporin use.   . Tetracyclines & Related Other (See Comments)  GI side effects    Review of Systems  All other systems reviewed and are negative.      Objective:    Physical Exam Vitals and nursing note reviewed.  Constitutional:      Appearance: Normal appearance.  HENT:     Head: Normocephalic.     Right Ear: External ear normal.     Left Ear: External ear normal.     Nose: No congestion or rhinorrhea.  Eyes:     General: No scleral icterus.    Extraocular Movements: Extraocular movements intact.     Conjunctiva/sclera: Conjunctivae normal.     Pupils: Pupils are  equal, round, and reactive to light.  Cardiovascular:     Rate and Rhythm: Normal rate.     Pulses: Normal pulses.  Pulmonary:     Effort: Pulmonary effort is normal.  Musculoskeletal:        General: No swelling.     Cervical back: Normal range of motion and neck supple.     Right lower leg: No edema.     Left lower leg: No edema.     Right foot: Decreased range of motion. Normal capillary refill. Tenderness and bony tenderness present. No swelling, deformity, bunion, Charcot foot, foot drop, prominent metatarsal heads or crepitus. Normal pulse.     Left foot: Normal range of motion and normal capillary refill. No swelling, deformity, bunion, Charcot foot, foot drop, prominent metatarsal heads, tenderness, bony tenderness or crepitus. Normal pulse.  Feet:     Right foot:     Skin integrity: Skin integrity normal. No ulcer, blister, skin breakdown, erythema, warmth, callus, dry skin or fissure.     Left foot:     Skin integrity: Skin integrity normal. No ulcer, blister, skin breakdown, erythema, warmth, callus, dry skin or fissure.  Skin:    General: Skin is warm.     Capillary Refill: Capillary refill takes less than 2 seconds.     Coloration: Skin is not jaundiced.     Findings: No bruising, erythema or rash.  Neurological:     General: No focal deficit present.     Mental Status: She is alert and oriented to person, place, and time.     Gait: Gait abnormal.  Psychiatric:        Mood and Affect: Mood normal.        Behavior: Behavior normal.        Thought Content: Thought content normal.        Judgment: Judgment normal.     BP 124/72   Pulse 68   Temp 97.6 F (36.4 C) (Temporal)   Resp 17   Wt 223 lb 7 oz (101.4 kg)   SpO2 97% Comment: 3 LPM  BMI 33.97 kg/m  Wt Readings from Last 3 Encounters:  10/30/19 223 lb 7 oz (101.4 kg)  10/16/19 224 lb (101.6 kg)  09/08/19 222 lb 6.4 oz (100.9 kg)    Health Maintenance Due  Topic Date Due  . COVID-19 Vaccine (1) Never  done  . COLONOSCOPY  Never done  . OPHTHALMOLOGY EXAM  09/07/2018  . MAMMOGRAM  04/21/2019    There are no preventive care reminders to display for this patient.   Lab Results  Component Value Date   TSH 1.98 09/17/2015   Lab Results  Component Value Date   WBC 21.2 (H) 09/08/2019   HGB 13.2 09/08/2019   HCT 46.3 (H) 09/08/2019   MCV 75.4 (L) 09/08/2019   PLT 534 (H)  09/08/2019   Lab Results  Component Value Date   NA 137 09/08/2019   K 4.1 09/08/2019   CO2 27 09/08/2019   GLUCOSE 119 (H) 09/08/2019   BUN 16 09/08/2019   CREATININE 0.73 09/08/2019   BILITOT 0.4 09/08/2019   ALKPHOS 141 (H) 09/08/2019   AST 8 (L) 09/08/2019   ALT <6 09/08/2019   PROT 7.1 09/08/2019   ALBUMIN 3.3 (L) 09/08/2019   CALCIUM 8.8 (L) 09/08/2019   ANIONGAP 9 09/08/2019   Lab Results  Component Value Date   CHOL 158 10/16/2019   Lab Results  Component Value Date   HDL 29 (L) 10/16/2019   Lab Results  Component Value Date   LDLCALC 87 10/16/2019   Lab Results  Component Value Date   TRIG 345 (H) 10/16/2019   Lab Results  Component Value Date   CHOLHDL 5.4 (H) 10/16/2019   Lab Results  Component Value Date   HGBA1C 6.0 (H) 10/16/2019       Assessment & Plan:   Problem List Items Addressed This Visit      Nervous and Auditory   Peripheral neuropathy     Other   Chronic pain disorder    Other Visit Diagnoses    Right foot pain    -  Primary   Relevant Medications   diclofenac Sodium (VOLTAREN) 1 % GEL   Other Relevant Orders   DG Foot Complete Right   Gait instability        Your right foot pain is most likely related to your musckuloskeletal strain secondary to chronic deconditioning with h/o polyneuropathy, abnormal gait, chronic pain disorder. You should complete and xray of the right foot to be sure that you do not have an acute injury. Related to the caution that you have stated your regular PCP has encouraged with  Avoiding NSAID's, instead of Ibuprofen or  NSAID'S by mouth you may take tylenol/acetamenophen as needed for pain. In addition I have prescribed Voltaren gel to be used sparingly as this is a NSAID used topically. I have ordered a xray of right foot to evaluate for injury.   I have wrapped the foot to include figure Amanda the ankle for stability for compression. RICE is encouraged: REST, ICE, Compression, Elevation.   As discussed if sxs become worse or do not resolve, skin becomes red, increased pain, numb, tingling, discolored, change in temperature, you develop fever or chills seek urgent medical attention.    Meds ordered this encounter  Medications  . diclofenac Sodium (VOLTAREN) 1 % GEL    Sig: Apply 2 g topically 2 (two) times daily as needed (for pain right foot).    Dispense:  100 g    Refill:  0   Follow Up: for symptoms of DVT such as change in color or temperature of skin, numbness or tingling, dark hot red spot on the calf. Also for non resolving or worsening symptoms.  Annie Main, FNP

## 2019-11-16 ENCOUNTER — Other Ambulatory Visit: Payer: Self-pay | Admitting: Family Medicine

## 2019-11-16 ENCOUNTER — Other Ambulatory Visit: Payer: Self-pay | Admitting: *Deleted

## 2019-11-16 MED ORDER — NYSTATIN 100000 UNIT/GM EX POWD: 1.0000 "application " | Freq: Three times a day (TID) | CUTANEOUS | 2 refills | Status: DC

## 2019-11-16 NOTE — Progress Notes (Signed)
HEMATOLOGY/ONCOLOGY CLINIC NOTE  Date of Service: 11/17/2019  Patient Care Team: [facility], Modena Nunnery, MD as PCP - General (Family Medicine) Fanny Bien, MD as Attending Physician (Family Medicine) [name], MD as Attending Physician (Cardiothoracic Surgery) [name], MD as Consulting Physician (Hematology)  CHIEF COMPLAINTS/PURPOSE OF CONSULTATION:  Continue mx of Jak2 mutation positive MPN  HISTORY OF PRESENTING ILLNESS:   Katelyn Cline is a wonderful 59 y.o. female who has been referred to [place] by Dr. Tana Coast  for evaluation and management of Leukocytosis. The pt reports that she is doing well overall.   The pt presented to the ED on 03/11/18 with significant weakness, hyperkalemia, and increasing leukocytosis with her PCP. She also notes that she had abscesses and facial infections about 4 weeks ago and received Clindamycin. She then developed a vaginal yeast infection, treated with Monistat and worsened to lesions and sores. The pt notes that since beginning antibiotics her cough has improved.   Patient report having a h/o melanoma metastatic to lung s/pwedge resection in 2014 . No recurrence since then. She notes she was been monitored with CT scans since then.  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  Patient notes that she previous needed therapeutic phlebotomy along time back but  denies ever taking medication for her polycythemia vera.   The pt notes that she used to sleep with a CPAP for many years. She then began treatment for her narcolepsy. The pt notes that she is on O2 and has remained on O2 since her surgery in 2014 to resect the melanoma. She has degenerative disc disease, fibromyalgia, and neuropathy in her feet.   The pt notes that she feels less "groggy-headed"  today.   Most recent lab results (03/14/18) of CBC and BMP is as follows: all values are WNL except for WBC at 31.0k, RBC at 6.97, HCT at 47.1, MCV at 67.6, MCH at 19.7, MCHC at 29.1, RDW at 22.3, PLT at 542k, Glucose at 138.  On review of systems, pt reports recent infections, feeling tired, moving her bowels well, resolved cough and denies abdominal pains, problems passing urine, and any other symptoms.   On PMHx the pt reports basal cell carcinoma in 2013, metastatic melanoma to lung wedge resected with thoracoscopy on 10/21/12.  Interval History:   Katelyn Cline returns today for management and evaluation of her polycythemia vera. The patient's last visit with [place] was on 09/08/19. The pt reports that she is doing well overall.  The pt reports she is good. She was switched to Jardiance medication. She was having bad constipation and very dark stools with the previous medication. Pt is drinking a lot of water but urinates it out. She has been taking 2 pills of hydroxyurea regularly. Pt's stomach has been bloating and rock hard. She also had swelling in her foot and she has arthritis. Pt drinks a glass of diet pepsi and sugar free kool-aid.   Lab results today (11/17/19) of CBC w/diff and CMP is as follows: all values are WNL except for WBC at 19.1K, RBC at 6.62, Hemoglobin at 15.5, HCT at 53.0, MCH at 23.4, MCHC at 29.2, RDW at 23.5, Platelets at 549K, Neutro Abs at 15.4K, Basophils Absolute at 0.2K, Abs Immature Granulocytes at 0.15K, Glucose at 146, BUN at 24, AST at  12, Alkaline Phosphatase at 143 11/17/19 of LDH at 260  On review of systems, pt reports dark/black stools, bloating, staying hydrated, steady weight and denies infection, abdominal pain and any other symptoms.   MEDICAL HISTORY:  Past Medical History:  Diagnosis Date  . Agoraphobia   . Arthritis    "all joints"  . Chronic low back pain   . Chronic respiratory failure with hypoxia ([name])    4L  via [place],  followed by pcp,    (09-16-2018  per pt only uses while at home and at night ,  portable oxygen is not working)  . COPD (chronic obstructive pulmonary disease) ([place])   . DDD (degenerative disc disease), lumbosacral   . Dental caries   . DM type 2 (diabetes mellitus, type 2) ([place])    followed by pcp  . Fibromyalgia   . GERD (gastroesophageal reflux disease)    occasional , no meds  . History of basal cell carcinoma (BCC) excision    s/p  Moh's of face/ nose 12/ 2013  . History of palpitations    per pt found to have occaional PVCs  . History of UTI   . HTN (hypertension)   . Hyperlipidemia   . Loose, teeth   . Major depression   . Metastatic melanoma ([place]) 10/18/2012   12 mm posterior right upper lobe pulmonary nodule, max SUV 3.0  s/p  right wedge resection 10-21-2012,  followed by oncologist-- dr Irene Limbo,  no recurrence  . Mixed stress and urge urinary incontinence   . Myeloproliferative neoplasm ([place])   . Narcolepsy   . OSA (obstructive sleep apnea)   . Oxygen dependent    4 L via ,  per pt portable oxygen not working but does use while at home and at night  . Panic attacks   . Periodontitis   . Peripheral neuropathy    feet  . 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 Pulmonary nodules    bilateral small stable per CT 03-11-2018  . Scoliosis   . Smokers' cough ([place])    per pt productive a little in the morning's  . Wears glasses     SURGICAL HISTORY: Past Surgical History:  Procedure Laterality Date  . ABDOMINAL HYSTERECTOMY  1998  . BREAST LUMPECTOMY Bilateral 1995   benign per pt  . COLONOSCOPY W/ BIOPSIES AND POLYPECTOMY     Hx: of  . CYSTECTOMY  2000   abdominal wall   . DILATION AND CURETTAGE OF UTERUS  yrs ago  . MOHS SURGERY  2013   nose/face  . MULTIPLE EXTRACTIONS WITH ALVEOLOPLASTY N/A 09/19/2018   Procedure: Extraction of  tooth #'s 2, 3, 6-14, 18, and 20-30 with alveoloplasty;  Surgeon: [name], DDS;  Location: [facility];  Service: Oral Surgery;  Laterality: N/A;  GENERAL WITH NASAL TUBE  . VIDEO ASSISTED THORACOSCOPY (VATS)/WEDGE RESECTION Right 10/21/2012   Procedure: VIDEO ASSISTED THORACOSCOPY (VATS)/WEDGE RESECTION;  Surgeon: [name], MD;  Location: [facility];  Service: Thoracic;  Laterality: Right;  Marland Kitchen VIDEO BRONCHOSCOPY N/A 10/21/2012   Procedure: VIDEO BRONCHOSCOPY;  Surgeon: [name], MD;  Location: [facility] OR;  Service: Thoracic;  Laterality: N/A;    SOCIAL HISTORY: Social History   Socioeconomic History  . Marital status: Divorced    Spouse name: Not on file  . Number of children: Not on file  .  Years of education: 12+  . Highest education level: Not on file  Occupational History  . Not on file  Tobacco Use  . Smoking status: Current Every Day Smoker    Packs/day: 1.00    Years: 50.00    Pack years: 50.00    Types: Cigarettes  . Smokeless tobacco: Never Used  Substance and Sexual Activity  . Alcohol use: Yes    Comment: occasional  . Drug use: No  . Sexual activity: Not on file  Other Topics Concern  . Not on file  Social History Narrative  . Not on file   Social Determinants of Health   Financial Resource Strain:   . Difficulty of Paying Living Expenses:   Food Insecurity:   . Worried About Charity fundraiser in the Last Year:   . Arboriculturist in the Last Year:   Transportation Needs:   . Film/video editor (Medical):   Marland Kitchen Lack of Transportation (Non-Medical):   Physical Activity:   . Days of Exercise per Week:   . Minutes of Exercise per Session:   Stress:   . Feeling of Stress :   Social Connections:   . Frequency of Communication with Friends and Family:   . Frequency of Social Gatherings with Friends and Family:   . Attends Religious Services:   . Active Member of Clubs or Organizations:   . Attends Archivist Meetings:   Marland Kitchen Marital  Status:   Intimate Partner Violence:   . Fear of Current or Ex-Partner:   . Emotionally Abused:   Marland Kitchen Physically Abused:   . Sexually Abused:     FAMILY HISTORY: Family History  Problem Relation Age of Onset  . Arthritis Mother   . Hyperlipidemia Mother   . Depression Mother   . Anxiety disorder Mother   . Dementia Mother   . Hypertension Father   . Hyperlipidemia Father   . Heart disease Father   . Stroke Father   . Dementia Father   . Heart disease Brother   . ADD / ADHD Son   . Alcohol abuse Maternal Grandfather   . Bipolar disorder Neg Hx   . Drug abuse Neg Hx   . OCD Neg Hx   . Paranoid behavior Neg Hx   . Schizophrenia Neg Hx   . Seizures Neg Hx   . Sexual abuse Neg Hx   . Physical abuse Neg Hx     ALLERGIES:  is allergic to contrast media [iodinated diagnostic agents]; erythromycin; flagyl [metronidazole]; latex; other; penicillins; and tetracyclines & related.  MEDICATIONS:  Current Outpatient Medications  Medication Sig Dispense Refill  . amLODipine (NORVASC) 5 MG tablet TAKE 1 TABLET(5 MG) BY MOUTH DAILY 90 tablet 2  . aspirin EC 81 MG tablet Take 81 mg by mouth daily.    Marland Kitchen atorvastatin (LIPITOR) 80 MG tablet TAKE 1 TABLET BY MOUTH DAILY AT 6:00 PM 90 tablet 3  . Blood Glucose Monitoring Suppl (BLOOD GLUCOSE SYSTEM PAK) KIT Please dispense based on patient and insurance preference. Use as directed to monitor FSBS 2x daily. Dx: E11.9 1 kit 1  . buPROPion (WELLBUTRIN SR) 100 MG 12 hr tablet TAKE 1 TABLET(100 MG) BY MOUTH TWICE DAILY 60 tablet 3  . diclofenac Sodium (VOLTAREN) 1 % GEL Apply 2 g topically 2 (two) times daily as needed (for pain right foot). 100 g 0  . DULoxetine (CYMBALTA) 60 MG capsule TAKE 2 CAPSULES(120 MG) BY MOUTH DAILY 60 capsule 2  .  empagliflozin (JARDIANCE) 25 MG TABS tablet Take 25 mg by mouth daily before breakfast. 90 tablet 3  . fenofibrate (TRICOR) 145 MG tablet Take 1 tablet (145 mg total) by mouth daily. 90 tablet 3  . fish  oil-omega-3 fatty acids 1000 MG capsule Take 2 g by mouth 2 (two) times daily.    Marland Kitchen gabapentin (NEURONTIN) 100 MG capsule TAKE 1 TO 3 CAPSULES(100 TO 300 MG) BY MOUTH THREE TIMES DAILY AS NEEDED FOR NERVE PAIN 180 capsule 1  . Glucose Blood (BLOOD GLUCOSE TEST STRIPS) STRP Please dispense based on patient and insurance preference. Use as directed to monitor FSBS 2x daily. Dx: E11.9 100 strip 1  . hydrochlorothiazide (HYDRODIURIL) 25 MG tablet TAKE 1 TABLET(25 MG) BY MOUTH DAILY 90 tablet 3  . hydroxyurea (HYDREA) 500 MG capsule Take 2 capsules (1,000 mg total) by mouth daily. May take with food to minimize GI side effects. 60 capsule 3  . lisinopril (ZESTRIL) 2.5 MG tablet TAKE 1 TABLET(2.5 MG) BY MOUTH AT BEDTIME 90 tablet 0  . metFORMIN (GLUCOPHAGE) 1000 MG tablet TAKE 1 TABLET BY MOUTH TWICE DAILY WITH A MEAL 180 tablet 0  . methocarbamol (ROBAXIN) 750 MG tablet     . Microlet Lancets MISC Please dispense based on patient and insurance preference. Use as directed to monitor FSBS 2x daily. Dx: E11.9 100 each 11  . nystatin (MYCOSTATIN/NYSTOP) powder Apply 1 application topically 3 (three) times daily. PA Case: [id number] Approved 06/30/2019- 06/28/2020 60 g 2  . nystatin cream (MYCOSTATIN) Apply 1 application topically 2 (two) times daily. To affected areas 60 g 2  . oxyCODONE-acetaminophen (PERCOCET) 7.5-325 MG tablet Take 1 tablet by mouth 4 (four) times daily as needed for severe pain.    . Pseudoeph-Doxylamine-DM-APAP (NYQUIL PO) Take 1-2 capsules by mouth at bedtime as needed (for cold/flu).    Marland Kitchen senna (SENOKOT) 8.6 MG TABS tablet Take 2 tablets by mouth daily as needed for mild constipation.    . valACYclovir (VALTREX) 1000 MG tablet 1 tab BID prn outbreak 90 tablet 0   No current facility-administered medications for this visit.    REVIEW OF SYSTEMS:   A 10+ POINT REVIEW OF SYSTEMS WAS OBTAINED including neurology, dermatology, psychiatry, cardiac, respiratory, lymph, extremities, GI, GU,  Musculoskeletal, constitutional, breasts, reproductive, HEENT.  All pertinent positives are noted in the HPI.  All others are negative.   PHYSICAL EXAMINATION: ECOG PERFORMANCE STATUS: 2 - Symptomatic, <50% confined to bed  Vitals:   11/17/19 1026  BP: (!) 147/67  Pulse: 77  Resp: 18  Temp: 97.7 F (36.5 C)  SpO2: 100%   Filed Weights   11/17/19 1026  Weight: 222 lb 3.2 oz (100.8 kg)   .Body mass index is 33.79 kg/m.   Exam given in chair   GENERAL:alert, in no acute distress and comfortable SKIN: no acute rashes, no significant lesions EYES: conjunctiva are pink and non-injected, sclera anicteric OROPHARYNX: MMM, no exudates, no oropharyngeal erythema or ulceration NECK: supple, no JVD LYMPH:  no palpable lymphadenopathy in the cervical, axillary or inguinal regions LUNGS: clear to auscultation b/l with normal respiratory effort HEART: regular rate & rhythm ABDOMEN:  normoactive bowel sounds , non tender, not distended. Extremity: no pedal edema PSYCH: alert & oriented x 3 with fluent speech NEURO: no focal motor/sensory deficits  LABORATORY DATA:  I have reviewed the data as listed  . CBC Latest Ref Rng & Units 11/17/2019 09/08/2019 07/19/2019  WBC 4.0 - 10.5 K/uL 19.1(H) 21.2(H) 23.3(H)  Hemoglobin  12.0 - 15.0 g/dL 15.5(H) 13.2 13.5  Hematocrit 36.0 - 46.0 % 53.0(H) 46.3(H) 47.7(H)  Platelets 150 - 400 K/uL 549(H) 534(H) 677(H)   . CBC    Component Value Date/Time   WBC 19.1 (H) 11/17/2019 1002   RBC 6.62 (H) 11/17/2019 1002   HGB 15.5 (H) 11/17/2019 1002   HGB 14.3 06/12/2019 1143   HCT 53.0 (H) 11/17/2019 1002   PLT 549 (H) 11/17/2019 1002   PLT 1,107 (HH) 06/12/2019 1143   MCV 80.1 11/17/2019 1002   MCH 23.4 (L) 11/17/2019 1002   MCHC 29.2 (L) 11/17/2019 1002   RDW 23.5 (H) 11/17/2019 1002   LYMPHSABS 2.2 11/17/2019 1002   MONOABS 0.7 11/17/2019 1002   EOSABS 0.5 11/17/2019 1002   BASOSABS 0.2 (H) 11/17/2019 1002     . CMP Latest Ref Rng & Units  11/17/2019 09/08/2019 07/19/2019  Glucose 70 - 99 mg/dL 146(H) 119(H) 130(H)  BUN 8 - 23 mg/dL 24(H) 16 15  Creatinine 0.44 - 1.00 mg/dL 0.83 0.73 0.73  Sodium 135 - 145 mmol/L 141 137 141  Potassium 3.5 - 5.1 mmol/L 4.2 4.1 4.3  Chloride 98 - 111 mmol/L 104 101 104  CO2 22 - 32 mmol/L _0 Calcium 8.9 - 10.3 mg/dL 9.9 8.8(L) 8.8(L)  Total Protein 6.5 - 8.1 g/dL 7.5 7.1 7.2  Total Bilirubin 0.3 - 1.2 mg/dL 0.3 0.4 0.2(L)  Alkaline Phos 38 - 126 U/L 143(H) 141(H) 166(H)  AST 15 - 41 U/L 12(L) 8(L) 8(L)  ALT 0 - 44 U/L 15 <6 8   08/15/12 JAK2 Mutation:     03/15/18 BM Bx:      RADIOGRAPHIC STUDIES: I have personally reviewed the radiological images as listed and agreed with the findings in the report. No results found.  ASSESSMENT & PLAN:   59 y.o. female with  1. Jak2 V617F mutation positive Myeloproliferative syndrome.- BM Bx consistent with Polycythemia Vera  JAK2 mutation identified in 08/15/12 labs   03/15/18 BM Bx revealed results consistent with JAK2 positive polycythemia vera   Current hgb.hct are at 14/47.6 Thrombocytosis PLT 725k  2. Increasing leucocytosis - primarily neutrophilia. - related to Polycythemia vera Patient with recent dental infections/abscesses and respiratory infection. Part of Leukocytosis could be due to his MPN  - PCV vs Myelofibrosis.  Neutrophilia began in March 2015 at 9.6k, slowly increased to 14k in August 2018, 19k in March 2019, now to [place] in September 2019.  3. Polycythemia vera Confirmed by 08/15/12 JAK 2 mutation test  4. History of Metastatic melanoma to the right lung  S/p resection by Dr. Servando Snare on 10/21/12, BRAF mutation not detected. -continue q69monthy skin screening her PCP/dermatologist -symptom directed radiologic scanning.   PLAN: -Discussed pt labwork today, 11/17/19; of CBC w/diff and CMP is as follows: all values are WNL except for WBC at 19.1K, RBC at 6.62, Hemoglobin at 15.5, HCT at 53.0, MCH at 23.4, MCHC at  29.2, RDW at 23.5, Platelets at 549K, Neutro Abs at 15.4K, Basophils Absolute at 0.2K, Abs Immature Granulocytes at 0.15K, Glucose at 146, BUN at 24, AST at 12, Alkaline Phosphatase at 143 -Discussed 11/17/19 of LDH at 260 -No phlebotomy today  -Advised on Jardiance making polycythemia worse  -Advised on black/dark stools  -Advised on sugar free kool-aid drink  -Advised pt is oxygen dependent and HCT levels  -Goal of HCT <50%  -Goal for PLT between 200k and 400k - at 549K on 11/17/19 -The pt has no prohibitive toxicities from  continuing 1055m Hydroxyurea daily -Encouraged her to maintain compliance with her medications  -Recommends colonoscopy  -Will increase hydroxyurea -2 pills extra 2 days of the week (3 pills Monday, Thursday, 2 pills the rest of the day) -F/u with PCP for Jardiance use -increasing HCT and black stools  -Recommends seeing dermatologist -skin screening twice a year  -Will get labs again in 6 weeks -Will set up tentative therapeutic phlebotomy in 6 weeks -Will see back in 3 months   FOLLOW UP: Labs and therpaeutic phlebotomy in 6 weeks RTC with Dr KIrene Limbowith labs in 3 months  The total time spent in the appt was 30 minutes and more than 50% was on counseling and direct patient cares.  All of the patient's questions were answered with apparent satisfaction. The patient knows to call the clinic with any problems, questions or concerns.  GSullivan LoneMD MElbaAAHIVMS SSierra Vista HospitalCWadley Regional Medical CenterHematology/Oncology Physician CMerit Health Madison (Office):       [phone](Work cell):  [phone](Fax):           [phone] 11/17/2019 11:20 AM  I, EDawayne Cirriam acting as a sEducation administratorfor Dr. GSullivan Lone   .I have reviewed the above documentation for accuracy and completeness, and I agree with the above.  .Brunetta GeneraMD

## 2019-11-17 ENCOUNTER — Inpatient Hospital Stay: Payer: Medicare HMO | Admitting: Hematology

## 2019-11-17 ENCOUNTER — Inpatient Hospital Stay: Payer: Medicare HMO | Attending: Hematology

## 2019-11-17 ENCOUNTER — Other Ambulatory Visit: Payer: Self-pay

## 2019-11-17 VITALS — BP 147/67 | HR 77 | Temp 97.7°F | Resp 18 | Ht 68.0 in | Wt 222.2 lb

## 2019-11-17 DIAGNOSIS — Z79899 Other long term (current) drug therapy: Secondary | ICD-10-CM | POA: Diagnosis not present

## 2019-11-17 DIAGNOSIS — E119 Type 2 diabetes mellitus without complications: Secondary | ICD-10-CM | POA: Insufficient documentation

## 2019-11-17 DIAGNOSIS — Z7982 Long term (current) use of aspirin: Secondary | ICD-10-CM | POA: Diagnosis not present

## 2019-11-17 DIAGNOSIS — D473 Essential (hemorrhagic) thrombocythemia: Secondary | ICD-10-CM

## 2019-11-17 DIAGNOSIS — F17210 Nicotine dependence, cigarettes, uncomplicated: Secondary | ICD-10-CM | POA: Insufficient documentation

## 2019-11-17 DIAGNOSIS — Z8249 Family history of ischemic heart disease and other diseases of the circulatory system: Secondary | ICD-10-CM | POA: Insufficient documentation

## 2019-11-17 DIAGNOSIS — Z90710 Acquired absence of both cervix and uterus: Secondary | ICD-10-CM | POA: Diagnosis not present

## 2019-11-17 DIAGNOSIS — D45 Polycythemia vera: Secondary | ICD-10-CM | POA: Diagnosis present

## 2019-11-17 DIAGNOSIS — I10 Essential (primary) hypertension: Secondary | ICD-10-CM | POA: Insufficient documentation

## 2019-11-17 DIAGNOSIS — Z85820 Personal history of malignant melanoma of skin: Secondary | ICD-10-CM | POA: Insufficient documentation

## 2019-11-17 DIAGNOSIS — D75839 Thrombocytosis, unspecified: Secondary | ICD-10-CM

## 2019-11-17 LAB — CMP (CANCER CENTER ONLY)
ALT: 15 U/L (ref 0–44)
AST: 12 U/L — ABNORMAL LOW (ref 15–41)
Albumin: 3.6 g/dL (ref 3.5–5.0)
Alkaline Phosphatase: 143 U/L — ABNORMAL HIGH (ref 38–126)
Anion gap: 11 (ref 5–15)
BUN: 24 mg/dL — ABNORMAL HIGH (ref 8–23)
CO2: 26 mmol/L (ref 22–32)
Calcium: 9.9 mg/dL (ref 8.9–10.3)
Chloride: 104 mmol/L (ref 98–111)
Creatinine: 0.83 mg/dL (ref 0.44–1.00)
GFR, Est AFR Am: >60 mL/min (ref >60)
GFR, Estimated: >60 mL/min (ref >60)
Glucose, Bld: 146 mg/dL — ABNORMAL HIGH (ref 70–99)
Potassium: 4.2 mmol/L (ref 3.5–5.1)
Sodium: 141 mmol/L (ref 135–145)
Total Bilirubin: 0.3 mg/dL (ref 0.3–1.2)
Total Protein: 7.5 g/dL (ref 6.5–8.1)

## 2019-11-17 LAB — CBC WITH DIFFERENTIAL/PLATELET
Abs Immature Granulocytes: 0.15 10*3/uL — ABNORMAL HIGH (ref 0.00–0.07)
Basophils Absolute: 0.2 10*3/uL — ABNORMAL HIGH (ref 0.0–0.1)
Basophils Relative: 1 %
Eosinophils Absolute: 0.5 10*3/uL (ref 0.0–0.5)
Eosinophils Relative: 2 %
HCT: 53 % — ABNORMAL HIGH (ref 36.0–46.0)
Hemoglobin: 15.5 g/dL — ABNORMAL HIGH (ref 12.0–15.0)
Immature Granulocytes: 1 %
Lymphocytes Relative: 12 %
Lymphs Abs: 2.2 10*3/uL (ref 0.7–4.0)
MCH: 23.4 pg — ABNORMAL LOW (ref 26.0–34.0)
MCHC: 29.2 g/dL — ABNORMAL LOW (ref 30.0–36.0)
MCV: 80.1 fL (ref 80.0–100.0)
Monocytes Absolute: 0.7 10*3/uL (ref 0.1–1.0)
Monocytes Relative: 4 %
Neutro Abs: 15.4 10*3/uL — ABNORMAL HIGH (ref 1.7–7.7)
Neutrophils Relative %: 80 %
Platelets: 549 10*3/uL — ABNORMAL HIGH (ref 150–400)
RBC: 6.62 MIL/uL — ABNORMAL HIGH (ref 3.87–5.11)
RDW: 23.5 % — ABNORMAL HIGH (ref 11.5–15.5)
WBC: 19.1 10*3/uL — ABNORMAL HIGH (ref 4.0–10.5)
nRBC: 0.2 % (ref 0.0–0.2)

## 2019-11-17 LAB — LACTATE DEHYDROGENASE: LDH: 260 U/L — ABNORMAL HIGH (ref 98–192)

## 2019-11-20 ENCOUNTER — Ambulatory Visit: Payer: Medicare HMO

## 2019-11-30 ENCOUNTER — Telehealth: Payer: Self-pay | Admitting: *Deleted

## 2019-11-30 NOTE — Telephone Encounter (Signed)
Received call from patient.   Reports that she has been seen by oncology and was advised to discuss Jardiance with PCP. States that Oncologist felt that this may be contributing to  Leukocytosis.   MD please advise.

## 2019-12-01 NOTE — Telephone Encounter (Signed)
They were concerned her HCT hemtocrit went up over the past month, not the white cells  She can D/C jardiance  Just take the metformin for now  Lets see what her blood levels look like

## 2019-12-01 NOTE — Telephone Encounter (Signed)
Call placed to patient and patient made aware.  

## 2019-12-20 ENCOUNTER — Other Ambulatory Visit: Payer: Self-pay | Admitting: Family Medicine

## 2019-12-29 ENCOUNTER — Inpatient Hospital Stay: Payer: Medicare HMO

## 2020-01-12 ENCOUNTER — Inpatient Hospital Stay: Payer: Medicare HMO | Attending: Hematology

## 2020-01-12 ENCOUNTER — Other Ambulatory Visit: Payer: Self-pay

## 2020-01-12 ENCOUNTER — Inpatient Hospital Stay: Payer: Medicare HMO

## 2020-01-12 DIAGNOSIS — D45 Polycythemia vera: Secondary | ICD-10-CM | POA: Diagnosis not present

## 2020-01-12 DIAGNOSIS — D75839 Thrombocytosis, unspecified: Secondary | ICD-10-CM

## 2020-01-12 LAB — CBC WITH DIFFERENTIAL/PLATELET
Abs Immature Granulocytes: 0.06 10*3/uL (ref 0.00–0.07)
Basophils Absolute: 0.2 10*3/uL — ABNORMAL HIGH (ref 0.0–0.1)
Basophils Relative: 2 %
Eosinophils Absolute: 0.2 10*3/uL (ref 0.0–0.5)
Eosinophils Relative: 2 %
HCT: 52.3 % — ABNORMAL HIGH (ref 36.0–46.0)
Hemoglobin: 15.1 g/dL — ABNORMAL HIGH (ref 12.0–15.0)
Immature Granulocytes: 1 %
Lymphocytes Relative: 12 %
Lymphs Abs: 1.2 10*3/uL (ref 0.7–4.0)
MCH: 25.1 pg — ABNORMAL LOW (ref 26.0–34.0)
MCHC: 28.9 g/dL — ABNORMAL LOW (ref 30.0–36.0)
MCV: 86.9 fL (ref 80.0–100.0)
Monocytes Absolute: 0.4 10*3/uL (ref 0.1–1.0)
Monocytes Relative: 4 %
Neutro Abs: 7.7 10*3/uL (ref 1.7–7.7)
Neutrophils Relative %: 79 %
Platelets: 368 10*3/uL (ref 150–400)
RBC: 6.02 MIL/uL — ABNORMAL HIGH (ref 3.87–5.11)
RDW: 21 % — ABNORMAL HIGH (ref 11.5–15.5)
WBC: 9.7 10*3/uL (ref 4.0–10.5)
nRBC: 0 % (ref 0.0–0.2)

## 2020-01-12 LAB — CMP (CANCER CENTER ONLY)
ALT: 14 U/L (ref 0–44)
AST: 11 U/L — ABNORMAL LOW (ref 15–41)
Albumin: 3.5 g/dL (ref 3.5–5.0)
Alkaline Phosphatase: 131 U/L — ABNORMAL HIGH (ref 38–126)
Anion gap: 9 (ref 5–15)
BUN: 16 mg/dL (ref 8–23)
CO2: 29 mmol/L (ref 22–32)
Calcium: 9.6 mg/dL (ref 8.9–10.3)
Chloride: 105 mmol/L (ref 98–111)
Creatinine: 0.77 mg/dL (ref 0.44–1.00)
GFR, Est AFR Am: >60 mL/min (ref >60)
GFR, Estimated: >60 mL/min (ref >60)
Glucose, Bld: 153 mg/dL — ABNORMAL HIGH (ref 70–99)
Potassium: 4.4 mmol/L (ref 3.5–5.1)
Sodium: 143 mmol/L (ref 135–145)
Total Bilirubin: 0.2 mg/dL — ABNORMAL LOW (ref 0.3–1.2)
Total Protein: 6.9 g/dL (ref 6.5–8.1)

## 2020-02-09 ENCOUNTER — Telehealth: Payer: Self-pay | Admitting: Hematology

## 2020-02-09 NOTE — Telephone Encounter (Signed)
Called pt per 8/13 sch msg- no answer. Left message for patient to call back to reschedule appt.

## 2020-02-13 ENCOUNTER — Inpatient Hospital Stay: Payer: Medicare HMO | Attending: Hematology

## 2020-02-13 ENCOUNTER — Inpatient Hospital Stay: Payer: Medicare HMO | Admitting: Hematology

## 2020-02-15 ENCOUNTER — Other Ambulatory Visit: Payer: Self-pay | Admitting: Family Medicine

## 2020-02-16 ENCOUNTER — Encounter: Payer: Medicare HMO | Admitting: Family Medicine

## 2020-03-18 ENCOUNTER — Other Ambulatory Visit: Payer: Self-pay | Admitting: Family Medicine

## 2020-04-05 ENCOUNTER — Inpatient Hospital Stay: Payer: Medicare HMO | Admitting: Hematology

## 2020-04-05 ENCOUNTER — Telehealth: Payer: Self-pay | Admitting: Hematology

## 2020-04-05 ENCOUNTER — Other Ambulatory Visit: Payer: Self-pay

## 2020-04-05 ENCOUNTER — Inpatient Hospital Stay: Payer: Medicare HMO | Attending: Hematology

## 2020-04-05 VITALS — BP 169/76 | HR 75 | Temp 97.5°F | Resp 18 | Ht 68.0 in | Wt 234.6 lb

## 2020-04-05 DIAGNOSIS — J449 Chronic obstructive pulmonary disease, unspecified: Secondary | ICD-10-CM | POA: Diagnosis not present

## 2020-04-05 DIAGNOSIS — D72829 Elevated white blood cell count, unspecified: Secondary | ICD-10-CM | POA: Insufficient documentation

## 2020-04-05 DIAGNOSIS — E119 Type 2 diabetes mellitus without complications: Secondary | ICD-10-CM | POA: Insufficient documentation

## 2020-04-05 DIAGNOSIS — Z7982 Long term (current) use of aspirin: Secondary | ICD-10-CM | POA: Diagnosis not present

## 2020-04-05 DIAGNOSIS — Z85820 Personal history of malignant melanoma of skin: Secondary | ICD-10-CM | POA: Diagnosis not present

## 2020-04-05 DIAGNOSIS — F17210 Nicotine dependence, cigarettes, uncomplicated: Secondary | ICD-10-CM | POA: Insufficient documentation

## 2020-04-05 DIAGNOSIS — D45 Polycythemia vera: Secondary | ICD-10-CM | POA: Diagnosis present

## 2020-04-05 DIAGNOSIS — Z79899 Other long term (current) drug therapy: Secondary | ICD-10-CM | POA: Diagnosis not present

## 2020-04-05 DIAGNOSIS — Z90710 Acquired absence of both cervix and uterus: Secondary | ICD-10-CM | POA: Insufficient documentation

## 2020-04-05 DIAGNOSIS — I10 Essential (primary) hypertension: Secondary | ICD-10-CM | POA: Diagnosis not present

## 2020-04-05 DIAGNOSIS — D75839 Thrombocytosis, unspecified: Secondary | ICD-10-CM

## 2020-04-05 LAB — CBC WITH DIFFERENTIAL/PLATELET
Abs Immature Granulocytes: 0.08 10*3/uL — ABNORMAL HIGH (ref 0.00–0.07)
Basophils Absolute: 0.1 10*3/uL (ref 0.0–0.1)
Basophils Relative: 1 %
Eosinophils Absolute: 0.2 10*3/uL (ref 0.0–0.5)
Eosinophils Relative: 2 %
HCT: 53.5 % — ABNORMAL HIGH (ref 36.0–46.0)
Hemoglobin: 15.9 g/dL — ABNORMAL HIGH (ref 12.0–15.0)
Immature Granulocytes: 1 %
Lymphocytes Relative: 13 %
Lymphs Abs: 1.4 10*3/uL (ref 0.7–4.0)
MCH: 26.3 pg (ref 26.0–34.0)
MCHC: 29.7 g/dL — ABNORMAL LOW (ref 30.0–36.0)
MCV: 88.4 fL (ref 80.0–100.0)
Monocytes Absolute: 0.4 10*3/uL (ref 0.1–1.0)
Monocytes Relative: 4 %
Neutro Abs: 9.1 10*3/uL — ABNORMAL HIGH (ref 1.7–7.7)
Neutrophils Relative %: 79 %
Platelets: 337 10*3/uL (ref 150–400)
RBC: 6.05 MIL/uL — ABNORMAL HIGH (ref 3.87–5.11)
RDW: 20.6 % — ABNORMAL HIGH (ref 11.5–15.5)
WBC: 11.3 10*3/uL — ABNORMAL HIGH (ref 4.0–10.5)
nRBC: 0 % (ref 0.0–0.2)

## 2020-04-05 LAB — CMP (CANCER CENTER ONLY)
ALT: 31 U/L (ref 0–44)
AST: 18 U/L (ref 15–41)
Albumin: 3.4 g/dL — ABNORMAL LOW (ref 3.5–5.0)
Alkaline Phosphatase: 128 U/L — ABNORMAL HIGH (ref 38–126)
Anion gap: 6 (ref 5–15)
BUN: 14 mg/dL (ref 8–23)
CO2: 33 mmol/L — ABNORMAL HIGH (ref 22–32)
Calcium: 9.9 mg/dL (ref 8.9–10.3)
Chloride: 99 mmol/L (ref 98–111)
Creatinine: 0.76 mg/dL (ref 0.44–1.00)
GFR, Estimated: >60 mL/min (ref >60)
Glucose, Bld: 209 mg/dL — ABNORMAL HIGH (ref 70–99)
Potassium: 4.2 mmol/L (ref 3.5–5.1)
Sodium: 138 mmol/L (ref 135–145)
Total Bilirubin: 0.4 mg/dL (ref 0.3–1.2)
Total Protein: 7.4 g/dL (ref 6.5–8.1)

## 2020-04-05 NOTE — Progress Notes (Signed)
HEMATOLOGY/ONCOLOGY CLINIC NOTE  Date of Service: 04/05/2020  Patient Care Team: [facility], Modena Nunnery, MD as PCP - General (Family Medicine) Fanny Bien, MD as Attending Physician (Family Medicine) [name], MD as Attending Physician (Cardiothoracic Surgery) [name], MD as Consulting Physician (Hematology)  CHIEF COMPLAINTS/PURPOSE OF CONSULTATION:  Continue mx of Jak2 mutation positive MPN  HISTORY OF PRESENTING ILLNESS:   Katelyn Cline is a wonderful 59 y.o. female who has been referred to [place] by Dr. Tana Coast  for evaluation and management of Leukocytosis. The pt reports that she is doing well overall.   The pt presented to the ED on 03/11/18 with significant weakness, hyperkalemia, and increasing leukocytosis with her PCP. She also notes that she had abscesses and facial infections about 4 weeks ago and received Clindamycin. She then developed a vaginal yeast infection, treated with Monistat and worsened to lesions and sores. The pt notes that since beginning antibiotics her cough has improved.   Patient report having a h/o melanoma metastatic to lung s/pwedge resection in 2014 . No recurrence since then. She notes she was been monitored with CT scans since then.  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  Patient notes that she previous needed therapeutic phlebotomy along time back but  denies ever taking medication for her polycythemia vera.   The pt notes that she used to sleep with a CPAP for many years. She then began treatment for her narcolepsy. The pt notes that she is on O2 and has remained on O2 since her surgery in 2014 to resect the melanoma. She has degenerative disc disease, fibromyalgia, and neuropathy in her feet.   The pt notes that she feels less "groggy-headed"  today.   Most recent lab results (03/14/18) of CBC and BMP is as follows: all values are WNL except for WBC at 31.0k, RBC at 6.97, HCT at 47.1, MCV at 67.6, MCH at 19.7, MCHC at 29.1, RDW at 22.3, PLT at 542k, Glucose at 138.  On review of systems, pt reports recent infections, feeling tired, moving her bowels well, resolved cough and denies abdominal pains, problems passing urine, and any other symptoms.   On PMHx the pt reports basal cell carcinoma in 2013, metastatic melanoma to lung wedge resected with thoracoscopy on 10/21/12.  Interval History:   Katelyn Cline returns today for management and evaluation of her polycythemia vera. The patient's last visit with [place] was on 11/17/2019. The pt reports that she is doing well overall.  The pt reports no acute new symptoms. Does not use O2 continuously and was encouraged to do so.   MEDICAL HISTORY:  Past Medical History:  Diagnosis Date  . Agoraphobia   . Arthritis    "all joints"  . Chronic low back pain   . Chronic respiratory failure with hypoxia ([name])    4L  via [place],  followed by pcp,   (09-16-2018  per pt only uses while at home and at night ,  portable oxygen is not working)  . COPD (chronic obstructive pulmonary disease) ([place])   . DDD (degenerative disc disease), lumbosacral   . Dental caries   . DM type 2 (diabetes mellitus, type 2) ([name])    followed by pcp  . Fibromyalgia   . GERD (gastroesophageal reflux disease)    occasional ,  no meds  . History of basal cell carcinoma (BCC) excision    s/p  Moh's of face/ nose 12/ 2013  . History of palpitations    per pt found to have occaional PVCs  . History of UTI   . HTN (hypertension)   . Hyperlipidemia   . Loose, teeth   . Major depression   . Metastatic melanoma ([place]) 10/18/2012   12 mm posterior right upper lobe pulmonary nodule, max SUV 3.0  s/p  right wedge resection 10-21-2012,  followed by oncologist-- dr Irene Limbo,  no recurrence  . Mixed stress and urge urinary  incontinence   . Myeloproliferative neoplasm ([place])   . Narcolepsy   . OSA (obstructive sleep apnea)   . Oxygen dependent    4 L via [place],  per pt portable oxygen not working but does use while at home and at night  . Panic attacks   . Periodontitis   . Peripheral neuropathy    feet  . 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 Pulmonary nodules    bilateral small stable per CT 03-11-2018  . Scoliosis   . Smokers' cough ([place])    per pt productive a little in the morning's  . Wears glasses     SURGICAL HISTORY: Past Surgical History:  Procedure Laterality Date  . ABDOMINAL HYSTERECTOMY  1998  . BREAST LUMPECTOMY Bilateral 1995   benign per pt  . COLONOSCOPY W/ BIOPSIES AND POLYPECTOMY     Hx: of  . CYSTECTOMY  2000   abdominal wall   . DILATION AND CURETTAGE OF UTERUS  yrs ago  . MOHS SURGERY  2013   nose/face  . MULTIPLE EXTRACTIONS WITH ALVEOLOPLASTY N/A 09/19/2018   Procedure: Extraction of tooth #'s 2, 3, 6-14, 18, and 20-30 with alveoloplasty;  Surgeon: [name], DDS;  Location: [facility];  Service: Oral Surgery;  Laterality: N/A;  GENERAL WITH NASAL TUBE  . VIDEO ASSISTED THORACOSCOPY (VATS)/WEDGE RESECTION Right 10/21/2012   Procedure: VIDEO ASSISTED THORACOSCOPY (VATS)/WEDGE RESECTION;  Surgeon: [name], MD;  Location: [place];  Service: Thoracic;  Laterality: Right;  Marland Kitchen VIDEO BRONCHOSCOPY N/A 10/21/2012   Procedure: VIDEO BRONCHOSCOPY;  Surgeon: [name], MD;  Location: [facility];  Service: Thoracic;  Laterality: N/A;    SOCIAL HISTORY: Social History   Socioeconomic History  . Marital status: Divorced    Spouse name: Not on file  . Number of children: Not on file  . Years of education: 12+  . Highest education level: Not on file  Occupational History  . Not on file  Tobacco Use  . Smoking  status: Current Every Day Smoker    Packs/day: 1.00    Years: 50.00    Pack years: 50.00    Types: Cigarettes  . Smokeless tobacco: Never Used  Vaping Use  . Vaping Use: Former  . Quit date: 09/15/2016  Substance and Sexual Activity  . Alcohol use: Yes    Comment: occasional  . Drug use: No  . Sexual activity: Not on file  Other Topics Concern  . Not on file  Social History Narrative  . Not on file   Social Determinants of Health   Financial Resource Strain:   . Difficulty of Paying Living Expenses: Not on file  Food Insecurity:   . Worried About Charity fundraiser in the Last Year:  Not on file  . Ran Out of Food in the Last Year: Not on file  Transportation Needs:   . Lack of Transportation (Medical): Not on file  . Lack of Transportation (Non-Medical): Not on file  Physical Activity:   . Days of Exercise per Week: Not on file  . Minutes of Exercise per Session: Not on file  Stress:   . Feeling of Stress : Not on file  Social Connections:   . Frequency of Communication with Friends and Family: Not on file  . Frequency of Social Gatherings with Friends and Family: Not on file  . Attends Religious Services: Not on file  . Active Member of Clubs or Organizations: Not on file  . Attends Archivist Meetings: Not on file  . Marital Status: Not on file  Intimate Partner Violence:   . Fear of Current or Ex-Partner: Not on file  . Emotionally Abused: Not on file  . Physically Abused: Not on file  . Sexually Abused: Not on file    FAMILY HISTORY: Family History  Problem Relation Age of Onset  . Arthritis Mother   . Hyperlipidemia Mother   . Depression Mother   . Anxiety disorder Mother   . Dementia Mother   . Hypertension Father   . Hyperlipidemia Father   . Heart disease Father   . Stroke Father   . Dementia Father   . Heart disease Brother   . ADD / ADHD Son   . Alcohol abuse Maternal Grandfather   . Bipolar disorder Neg Hx   . Drug abuse Neg Hx    . OCD Neg Hx   . Paranoid behavior Neg Hx   . Schizophrenia Neg Hx   . Seizures Neg Hx   . Sexual abuse Neg Hx   . Physical abuse Neg Hx     ALLERGIES:  is allergic to contrast media [iodinated diagnostic agents], erythromycin, flagyl [metronidazole], latex, other, penicillins, and tetracyclines & related.  MEDICATIONS:  Current Outpatient Medications  Medication Sig Dispense Refill  . amLODipine (NORVASC) 5 MG tablet TAKE 1 TABLET(5 MG) BY MOUTH DAILY 90 tablet 2  . aspirin EC 81 MG tablet Take 81 mg by mouth daily.    Marland Kitchen atorvastatin (LIPITOR) 80 MG tablet TAKE 1 TABLET BY MOUTH DAILY AT 6 PM 90 tablet 3  . Blood Glucose Monitoring Suppl (BLOOD GLUCOSE SYSTEM PAK) KIT Please dispense based on patient and insurance preference. Use as directed to monitor FSBS 2x daily. Dx: E11.9 1 kit 1  . buPROPion (WELLBUTRIN SR) 100 MG 12 hr tablet TAKE 1 TABLET(100 MG) BY MOUTH TWICE DAILY 60 tablet 3  . diclofenac Sodium (VOLTAREN) 1 % GEL Apply 2 g topically 2 (two) times daily as needed (for pain right foot). 100 g 0  . DULoxetine (CYMBALTA) 60 MG capsule TAKE 2 CAPSULES(120 MG) BY MOUTH DAILY 180 capsule 2  . empagliflozin (JARDIANCE) 25 MG TABS tablet Take 25 mg by mouth daily before breakfast. 90 tablet 3  . fenofibrate (TRICOR) 145 MG tablet Take 1 tablet (145 mg total) by mouth daily. 90 tablet 3  . fish oil-omega-3 fatty acids 1000 MG capsule Take 2 g by mouth 2 (two) times daily.    Marland Kitchen gabapentin (NEURONTIN) 100 MG capsule TAKE 1 TO 3 CAPSULES(100 TO 300 MG) BY MOUTH THREE TIMES DAILY AS NEEDED FOR NERVE PAIN 180 capsule 1  . Glucose Blood (BLOOD GLUCOSE TEST STRIPS) STRP Please dispense based on patient and insurance preference. Use as directed  to monitor FSBS 2x daily. Dx: E11.9 100 strip 1  . hydrochlorothiazide (HYDRODIURIL) 25 MG tablet TAKE 1 TABLET(25 MG) BY MOUTH DAILY 90 tablet 3  . hydroxyurea (HYDREA) 500 MG capsule Take 2 capsules (1,000 mg total) by mouth daily. May take with food  to minimize GI side effects. 60 capsule 3  . lisinopril (ZESTRIL) 2.5 MG tablet TAKE 1 TABLET(2.5 MG) BY MOUTH AT BEDTIME 90 tablet 0  . metFORMIN (GLUCOPHAGE) 1000 MG tablet TAKE 1 TABLET BY MOUTH TWICE DAILY WITH A MEAL 180 tablet 2  . methocarbamol (ROBAXIN) 750 MG tablet     . Microlet Lancets MISC Please dispense based on patient and insurance preference. Use as directed to monitor FSBS 2x daily. Dx: E11.9 100 each 11  . nystatin (MYCOSTATIN/NYSTOP) powder Apply 1 application topically 3 (three) times daily. PA Case: [id number] Approved 06/30/2019- 06/28/2020 60 g 2  . nystatin cream (MYCOSTATIN) Apply 1 application topically 2 (two) times daily. To affected areas 60 g 2  . oxyCODONE-acetaminophen (PERCOCET) 7.5-325 MG tablet Take 1 tablet by mouth 4 (four) times daily as needed for severe pain.    . Pseudoeph-Doxylamine-DM-APAP (NYQUIL PO) Take 1-2 capsules by mouth at bedtime as needed (for cold/flu).    Marland Kitchen senna (SENOKOT) 8.6 MG TABS tablet Take 2 tablets by mouth daily as needed for mild constipation.    . valACYclovir (VALTREX) 1000 MG tablet 1 tab BID prn outbreak 90 tablet 0   No current facility-administered medications for this visit.    REVIEW OF SYSTEMS:   A 10+ POINT REVIEW OF SYSTEMS WAS OBTAINED including neurology, dermatology, psychiatry, cardiac, respiratory, lymph, extremities, GI, GU, Musculoskeletal, constitutional, breasts, reproductive, HEENT.  All pertinent positives are noted in the HPI.  All others are negative.   PHYSICAL EXAMINATION: ECOG PERFORMANCE STATUS: 2 - Symptomatic, <50% confined to bed  There were no vitals filed for this visit. There were no vitals filed for this visit. .There is no height or weight on file to calculate BMI.   NAD GENERAL:alert, in no acute distress and comfortable SKIN: no acute rashes, no significant lesions EYES: conjunctiva are pink and non-injected, sclera anicteric OROPHARYNX: MMM, no exudates, no oropharyngeal erythema or  ulceration NECK: supple, no JVD LYMPH:  no palpable lymphadenopathy in the cervical, axillary or inguinal regions LUNGS: clear to auscultation b/l with normal respiratory effort HEART: regular rate & rhythm ABDOMEN:  normoactive bowel sounds , non tender, not distended. No palpable hepatosplenomegaly.  Extremity: no pedal edema PSYCH: alert & oriented x 3 with fluent speech NEURO: no focal motor/sensory deficits  LABORATORY DATA:  I have reviewed the data as listed  . CBC Latest Ref Rng & Units 01/12/2020 11/17/2019 09/08/2019  WBC 4.0 - 10.5 K/uL 9.7 19.1(H) 21.2(H)  Hemoglobin 12.0 - 15.0 g/dL 15.1(H) 15.5(H) 13.2  Hematocrit 36 - 46 % 52.3(H) 53.0(H) 46.3(H)  Platelets 150 - 400 K/uL 368 549(H) 534(H)   . CBC    Component Value Date/Time   WBC 9.7 01/12/2020 0959   RBC 6.02 (H) 01/12/2020 0959   HGB 15.1 (H) 01/12/2020 0959   HGB 14.3 06/12/2019 1143   HCT 52.3 (H) 01/12/2020 0959   PLT 368 01/12/2020 0959   PLT 1,107 (HH) 06/12/2019 1143   MCV 86.9 01/12/2020 0959   MCH 25.1 (L) 01/12/2020 0959   MCHC 28.9 (L) 01/12/2020 0959   RDW 21.0 (H) 01/12/2020 0959   LYMPHSABS 1.2 01/12/2020 0959   MONOABS 0.4 01/12/2020 0959   EOSABS 0.2 01/12/2020 0959  BASOSABS 0.2 (H) 01/12/2020 0959     . CMP Latest Ref Rng & Units 01/12/2020 11/17/2019 09/08/2019  Glucose 70 - 99 mg/dL 153(H) 146(H) 119(H)  BUN 8 - 23 mg/dL 16 24(H) 16  Creatinine 0.44 - 1.00 mg/dL 0.77 0.83 0.73  Sodium 135 - 145 mmol/L 143 141 137  Potassium 3.5 - 5.1 mmol/L 4.4 4.2 4.1  Chloride 98 - 111 mmol/L 105 104 101  CO2 22 - 32 mmol/L $RemoveB'29 26 27  '[id number]$ Calcium 8.9 - 10.3 mg/dL 9.6 9.9 8.8(L)  Total Protein 6.5 - 8.1 g/dL 6.9 7.5 7.1  Total Bilirubin 0.3 - 1.2 mg/dL 0.2(L) 0.3 0.4  Alkaline Phos 38 - 126 U/L 131(H) 143(H) 141(H)  AST 15 - 41 U/L 11(L) 12(L) 8(L)  ALT 0 - 44 U/L 14 15 <6   08/15/12 JAK2 Mutation:     03/15/18 BM Bx:      RADIOGRAPHIC STUDIES: I have personally reviewed the radiological  images as listed and agreed with the findings in the report. No results found.  ASSESSMENT & PLAN:   59 y.o. female with  1. Jak2 V617F mutation positive Myeloproliferative syndrome.- BM Bx consistent with Polycythemia Vera  JAK2 mutation identified in 08/15/12 labs   03/15/18 BM Bx revealed results consistent with JAK2 positive polycythemia vera   Current hgb.hct are at 14/47.6 Thrombocytosis PLT 725k  2. Increasing leucocytosis - primarily neutrophilia. - related to Polycythemia vera Patient with recent dental infections/abscesses and respiratory infection. Part of Leukocytosis could be due to his MPN  - PCV vs Myelofibrosis.  Neutrophilia began in March 2015 at 9.6k, slowly increased to 14k in August 2018, 19k in March 2019, now to [place] in September 2019.  3. Polycythemia vera Confirmed by 08/15/12 JAK 2 mutation test  4. History of Metastatic melanoma to the right lung  S/p resection by Dr. Servando Snare on 10/21/12, BRAF mutation not detected. -continue q41monthly skin screening her PCP/dermatologist -symptom directed radiologic scanning.   PLAN:  -The pt has no prohibitive toxicities from continuing 1500 mg Hydroxyurea 2x per week and 1000 mg every other day. -labs reviewed today.hematocrit is elevated at 53.5 - with her O2 dep chronic respiratory failure for HCT goal is <48. -Plz schedule for next 6 weekly doses of Nplate with labs MD visit in 4 weeks   FOLLOW UP:  PLz schedule therapeutic phlebotomy within 1 week and then monthly x 4 Labs with each therapeutic phlebotomy after the next one MD visit in 3 months   The total time spent in the appt was 20 mins minutes and more than 50% was on counseling and direct patient cares.  All of the patient's questions were answered with apparent satisfaction. The patient knows to call the clinic with any problems, questions or concerns.   Sullivan Lone MD [place] AAHIVMS [facility] Hematology/Oncology Physician [facility]  (Office):       [phone] (Work cell):  [phone] (Fax):           [phone]  04/05/2020 2:15 AM  I, Yevette Edwards, am acting as a scribe for Dr. Sullivan Lone.   .I have reviewed the above documentation for accuracy and completeness, and I agree with the above. [name] MD

## 2020-04-05 NOTE — Telephone Encounter (Signed)
Scheduled per 10/8 los. Pt could only come in on certain days due to son's work schedule. Printed appt calendar for pt.

## 2020-04-15 ENCOUNTER — Other Ambulatory Visit: Payer: Self-pay

## 2020-04-15 ENCOUNTER — Other Ambulatory Visit: Payer: Self-pay | Admitting: *Deleted

## 2020-04-15 ENCOUNTER — Inpatient Hospital Stay: Payer: Medicare HMO

## 2020-04-15 DIAGNOSIS — D45 Polycythemia vera: Secondary | ICD-10-CM

## 2020-04-15 LAB — CBC WITH DIFFERENTIAL (CANCER CENTER ONLY)
Abs Immature Granulocytes: 0.06 10*3/uL (ref 0.00–0.07)
Basophils Absolute: 0.1 10*3/uL (ref 0.0–0.1)
Basophils Relative: 1 %
Eosinophils Absolute: 0.2 10*3/uL (ref 0.0–0.5)
Eosinophils Relative: 2 %
HCT: 51.9 % — ABNORMAL HIGH (ref 36.0–46.0)
Hemoglobin: 15.6 g/dL — ABNORMAL HIGH (ref 12.0–15.0)
Immature Granulocytes: 1 %
Lymphocytes Relative: 16 %
Lymphs Abs: 1.5 10*3/uL (ref 0.7–4.0)
MCH: 26.4 pg (ref 26.0–34.0)
MCHC: 30.1 g/dL (ref 30.0–36.0)
MCV: 87.7 fL (ref 80.0–100.0)
Monocytes Absolute: 0.3 10*3/uL (ref 0.1–1.0)
Monocytes Relative: 3 %
Neutro Abs: 7 10*3/uL (ref 1.7–7.7)
Neutrophils Relative %: 77 %
Platelet Count: 292 10*3/uL (ref 150–400)
RBC: 5.92 MIL/uL — ABNORMAL HIGH (ref 3.87–5.11)
RDW: 19.7 % — ABNORMAL HIGH (ref 11.5–15.5)
WBC Count: 9.1 10*3/uL (ref 4.0–10.5)
nRBC: 0 % (ref 0.0–0.2)

## 2020-04-15 LAB — CMP (CANCER CENTER ONLY)
ALT: 12 U/L (ref 0–44)
AST: 9 U/L — ABNORMAL LOW (ref 15–41)
Albumin: 3.2 g/dL — ABNORMAL LOW (ref 3.5–5.0)
Alkaline Phosphatase: 129 U/L — ABNORMAL HIGH (ref 38–126)
Anion gap: 10 (ref 5–15)
BUN: 21 mg/dL (ref 8–23)
CO2: 26 mmol/L (ref 22–32)
Calcium: 10.2 mg/dL (ref 8.9–10.3)
Chloride: 101 mmol/L (ref 98–111)
Creatinine: 0.86 mg/dL (ref 0.44–1.00)
GFR, Estimated: >60 mL/min (ref >60)
Glucose, Bld: 273 mg/dL — ABNORMAL HIGH (ref 70–99)
Potassium: 3.8 mmol/L (ref 3.5–5.1)
Sodium: 137 mmol/L (ref 135–145)
Total Bilirubin: <0.2 mg/dL — ABNORMAL LOW (ref 0.3–1.2)
Total Protein: 7.2 g/dL (ref 6.5–8.1)

## 2020-04-15 NOTE — Patient Instructions (Signed)
Therapeutic Phlebotomy Therapeutic phlebotomy is the planned removal of blood from a person's body for the purpose of treating a medical condition. The procedure is similar to donating blood. Usually, about a pint (470 mL, or 0.47 L) of blood is removed. The average adult has 9-12 pints (4.3-5.7 L) of blood in the body. Therapeutic phlebotomy may be used to treat the following medical conditions:  Hemochromatosis. This is a condition in which the blood contains too much iron.  Polycythemia vera. This is a condition in which the blood contains too many red blood cells.  Porphyria cutanea tarda. This is a disease in which an important part of hemoglobin is not made properly. It results in the buildup of abnormal amounts of porphyrins in the body.  Sickle cell disease. This is a condition in which the red blood cells form an abnormal crescent shape rather than a round shape. Tell a health care provider about:  Any allergies you have.  All medicines you are taking, including vitamins, herbs, eye drops, creams, and over-the-counter medicines.  Any problems you or family members have had with anesthetic medicines.  Any blood disorders you have.  Any surgeries you have had.  Any medical conditions you have.  Whether you are pregnant or may be pregnant. What are the risks? Generally, this is a safe procedure. However, problems may occur, including:  Nausea or light-headedness.  Low blood pressure (hypotension).  Soreness, bleeding, swelling, or bruising at the needle insertion site.  Infection. What happens before the procedure?  Follow instructions from your health care provider about eating or drinking restrictions.  Ask your health care provider about: ? Changing or stopping your regular medicines. This is especially important if you are taking diabetes medicines or blood thinners (anticoagulants). ? Taking medicines such as aspirin and ibuprofen. These medicines can thin your  blood. Do not take these medicines unless your health care provider tells you to take them. ? Taking over-the-counter medicines, vitamins, herbs, and supplements.  Wear clothing with sleeves that can be raised above the elbow.  Plan to have someone take you home from the hospital or clinic.  You may have a blood sample taken.  Your blood pressure, pulse rate, and breathing rate will be measured. What happens during the procedure?   To lower your risk of infection: ? Your health care team will wash or sanitize their hands. ? Your skin will be cleaned with an antiseptic.  You may be given a medicine to numb the area (local anesthetic).  A tourniquet will be placed on your arm.  A needle will be inserted into one of your veins.  Tubing and a collection bag will be attached to that needle.  Blood will flow through the needle and tubing into the collection bag.  The collection bag will be placed lower than your arm to allow gravity to help the flow of blood into the bag.  You may be asked to open and close your hand slowly and continually during the entire collection.  After the specified amount of blood has been removed from your body, the collection bag and tubing will be clamped.  The needle will be removed from your vein.  Pressure will be held on the site of the needle insertion to stop the bleeding.  A bandage (dressing) will be placed over the needle insertion site. The procedure may vary among health care providers and hospitals. What happens after the procedure?  Your blood pressure, pulse rate, and breathing rate will be   measured after the procedure.  You will be encouraged to drink fluids.  Your recovery will be assessed and monitored.  You can return to your normal activities as told by your health care provider. Summary  Therapeutic phlebotomy is the planned removal of blood from a person's body for the purpose of treating a medical condition.  Therapeutic  phlebotomy may be used to treat hemochromatosis, polycythemia vera, porphyria cutanea tarda, or sickle cell disease.  In the procedure, a needle is inserted and about a pint (470 mL, or 0.47 L) of blood is removed. The average adult has 9-12 pints (4.3-5.7 L) of blood in the body.  This is generally a safe procedure, but it can sometimes cause problems such as nausea, light-headedness, or low blood pressure (hypotension). This information is not intended to replace advice given to you by your health care provider. Make sure you discuss any questions you have with your health care provider. Document Released: 11/17/2010 Document Revised: 07/01/2017 Document Reviewed: 07/01/2017 [employer]vier Interactive Patient Education  2019 [employer]  

## 2020-04-15 NOTE — Progress Notes (Signed)
Patient presented for phlebotomy per MD order.  A 16 gauge needle was placed in the RAC and 580ml of blood was removed.  The needle was removed with catheter fully intact.  The patient refused the 30 min post observation.  Reported feeling well, and ambulated from facility.  Vitals signs taken after procedure were stable.

## 2020-04-16 ENCOUNTER — Other Ambulatory Visit: Payer: Self-pay | Admitting: Family Medicine

## 2020-04-17 ENCOUNTER — Other Ambulatory Visit: Payer: Self-pay | Admitting: Hematology

## 2020-04-17 DIAGNOSIS — D45 Polycythemia vera: Secondary | ICD-10-CM

## 2020-04-17 DIAGNOSIS — D471 Chronic myeloproliferative disease: Secondary | ICD-10-CM

## 2020-04-17 MED ORDER — HYDROXYUREA 500 MG PO CAPS: ORAL_CAPSULE | ORAL | 3 refills | Status: DC

## 2020-05-20 ENCOUNTER — Inpatient Hospital Stay: Payer: Medicare HMO

## 2020-05-21 ENCOUNTER — Telehealth: Payer: Self-pay | Admitting: Hematology

## 2020-05-21 NOTE — Telephone Encounter (Signed)
Scheduled appts per 11/19 sch msg. Called both numbers on file for pt. Left voicemail on pt's mobil number on file.

## 2020-05-24 ENCOUNTER — Other Ambulatory Visit: Payer: Self-pay | Admitting: *Deleted

## 2020-05-24 ENCOUNTER — Inpatient Hospital Stay: Payer: Medicare HMO

## 2020-05-24 ENCOUNTER — Other Ambulatory Visit: Payer: Self-pay

## 2020-05-24 ENCOUNTER — Inpatient Hospital Stay: Payer: Medicare HMO | Attending: Hematology

## 2020-05-24 VITALS — BP 135/57 | HR 63 | Temp 98.0°F | Resp 18

## 2020-05-24 DIAGNOSIS — D471 Chronic myeloproliferative disease: Secondary | ICD-10-CM

## 2020-05-24 DIAGNOSIS — D45 Polycythemia vera: Secondary | ICD-10-CM

## 2020-05-24 LAB — CBC WITH DIFFERENTIAL (CANCER CENTER ONLY)
Abs Immature Granulocytes: 0.07 10*3/uL (ref 0.00–0.07)
Basophils Absolute: 0.1 10*3/uL (ref 0.0–0.1)
Basophils Relative: 1 %
Eosinophils Absolute: 0.2 10*3/uL (ref 0.0–0.5)
Eosinophils Relative: 2 %
HCT: 53 % — ABNORMAL HIGH (ref 36.0–46.0)
Hemoglobin: 15.8 g/dL — ABNORMAL HIGH (ref 12.0–15.0)
Immature Granulocytes: 1 %
Lymphocytes Relative: 11 %
Lymphs Abs: 1.5 10*3/uL (ref 0.7–4.0)
MCH: 25.5 pg — ABNORMAL LOW (ref 26.0–34.0)
MCHC: 29.8 g/dL — ABNORMAL LOW (ref 30.0–36.0)
MCV: 85.6 fL (ref 80.0–100.0)
Monocytes Absolute: 0.3 10*3/uL (ref 0.1–1.0)
Monocytes Relative: 2 %
Neutro Abs: 11.3 10*3/uL — ABNORMAL HIGH (ref 1.7–7.7)
Neutrophils Relative %: 83 %
Platelet Count: 232 10*3/uL (ref 150–400)
RBC: 6.19 MIL/uL — ABNORMAL HIGH (ref 3.87–5.11)
RDW: 19.9 % — ABNORMAL HIGH (ref 11.5–15.5)
WBC Count: 13.6 10*3/uL — ABNORMAL HIGH (ref 4.0–10.5)
nRBC: 0 % (ref 0.0–0.2)

## 2020-05-24 LAB — CMP (CANCER CENTER ONLY)
ALT: 13 U/L (ref 0–44)
AST: 12 U/L — ABNORMAL LOW (ref 15–41)
Albumin: 3.6 g/dL (ref 3.5–5.0)
Alkaline Phosphatase: 128 U/L — ABNORMAL HIGH (ref 38–126)
Anion gap: 12 (ref 5–15)
BUN: 23 mg/dL (ref 8–23)
CO2: 28 mmol/L (ref 22–32)
Calcium: 10 mg/dL (ref 8.9–10.3)
Chloride: 101 mmol/L (ref 98–111)
Creatinine: 0.9 mg/dL (ref 0.44–1.00)
GFR, Estimated: >60 mL/min (ref >60)
Glucose, Bld: 154 mg/dL — ABNORMAL HIGH (ref 70–99)
Potassium: 4.6 mmol/L (ref 3.5–5.1)
Sodium: 141 mmol/L (ref 135–145)
Total Bilirubin: 0.3 mg/dL (ref 0.3–1.2)
Total Protein: 7.3 g/dL (ref 6.5–8.1)

## 2020-05-24 NOTE — Patient Instructions (Signed)
Therapeutic Phlebotomy Therapeutic phlebotomy is the planned removal of blood from a person's body for the purpose of treating a medical condition. The procedure is similar to donating blood. Usually, about a pint (470 mL, or 0.47 L) of blood is removed. The average adult has 9-12 pints (4.3-5.7 L) of blood in the body. Therapeutic phlebotomy may be used to treat the following medical conditions:  Hemochromatosis. This is a condition in which the blood contains too much iron.  Polycythemia vera. This is a condition in which the blood contains too many red blood cells.  Porphyria cutanea tarda. This is a disease in which an important part of hemoglobin is not made properly. It results in the buildup of abnormal amounts of porphyrins in the body.  Sickle cell disease. This is a condition in which the red blood cells form an abnormal crescent shape rather than a round shape. Tell a health care provider about:  Any allergies you have.  All medicines you are taking, including vitamins, herbs, eye drops, creams, and over-the-counter medicines.  Any problems you or family members have had with anesthetic medicines.  Any blood disorders you have.  Any surgeries you have had.  Any medical conditions you have.  Whether you are pregnant or may be pregnant. What are the risks? Generally, this is a safe procedure. However, problems may occur, including:  Nausea or light-headedness.  Low blood pressure (hypotension).  Soreness, bleeding, swelling, or bruising at the needle insertion site.  Infection. What happens before the procedure?  Follow instructions from your health care provider about eating or drinking restrictions.  Ask your health care provider about: ? Changing or stopping your regular medicines. This is especially important if you are taking diabetes medicines or blood thinners (anticoagulants). ? Taking medicines such as aspirin and ibuprofen. These medicines can thin your  blood. Do not take these medicines unless your health care provider tells you to take them. ? Taking over-the-counter medicines, vitamins, herbs, and supplements.  Wear clothing with sleeves that can be raised above the elbow.  Plan to have someone take you home from the hospital or clinic.  You may have a blood sample taken.  Your blood pressure, pulse rate, and breathing rate will be measured. What happens during the procedure?   To lower your risk of infection: ? Your health care team will wash or sanitize their hands. ? Your skin will be cleaned with an antiseptic.  You may be given a medicine to numb the area (local anesthetic).  A tourniquet will be placed on your arm.  A needle will be inserted into one of your veins.  Tubing and a collection bag will be attached to that needle.  Blood will flow through the needle and tubing into the collection bag.  The collection bag will be placed lower than your arm to allow gravity to help the flow of blood into the bag.  You may be asked to open and close your hand slowly and continually during the entire collection.  After the specified amount of blood has been removed from your body, the collection bag and tubing will be clamped.  The needle will be removed from your vein.  Pressure will be held on the site of the needle insertion to stop the bleeding.  A bandage (dressing) will be placed over the needle insertion site. The procedure may vary among health care providers and hospitals. What happens after the procedure?  Your blood pressure, pulse rate, and breathing rate will be   measured after the procedure.  You will be encouraged to drink fluids.  Your recovery will be assessed and monitored.  You can return to your normal activities as told by your health care provider. Summary  Therapeutic phlebotomy is the planned removal of blood from a person's body for the purpose of treating a medical condition.  Therapeutic  phlebotomy may be used to treat hemochromatosis, polycythemia vera, porphyria cutanea tarda, or sickle cell disease.  In the procedure, a needle is inserted and about a pint (470 mL, or 0.47 L) of blood is removed. The average adult has 9-12 pints (4.3-5.7 L) of blood in the body.  This is generally a safe procedure, but it can sometimes cause problems such as nausea, light-headedness, or low blood pressure (hypotension). This information is not intended to replace advice given to you by your health care provider. Make sure you discuss any questions you have with your health care provider. Document Released: 11/17/2010 Document Revised: 07/01/2017 Document Reviewed: 07/01/2017 [employer]vier Interactive Patient Education  2019 [employer]  

## 2020-06-14 ENCOUNTER — Telehealth: Payer: Self-pay | Admitting: Hematology

## 2020-06-14 ENCOUNTER — Other Ambulatory Visit: Payer: Self-pay | Admitting: *Deleted

## 2020-06-14 DIAGNOSIS — D45 Polycythemia vera: Secondary | ICD-10-CM

## 2020-06-14 NOTE — Telephone Encounter (Signed)
Called pt per 12/17 sch msg - no answer . Left message for patient to call back to reschedule.

## 2020-06-15 ENCOUNTER — Other Ambulatory Visit: Payer: Self-pay | Admitting: Family Medicine

## 2020-06-17 ENCOUNTER — Inpatient Hospital Stay: Payer: Medicare HMO | Admitting: Hematology

## 2020-06-17 ENCOUNTER — Inpatient Hospital Stay: Payer: Medicare HMO

## 2020-06-18 ENCOUNTER — Encounter: Payer: Self-pay | Admitting: Family Medicine

## 2020-06-18 ENCOUNTER — Other Ambulatory Visit: Payer: Self-pay

## 2020-06-18 ENCOUNTER — Encounter (INDEPENDENT_AMBULATORY_CARE_PROVIDER_SITE_OTHER): Payer: Self-pay | Admitting: *Deleted

## 2020-06-18 ENCOUNTER — Ambulatory Visit (INDEPENDENT_AMBULATORY_CARE_PROVIDER_SITE_OTHER): Payer: Medicare HMO | Admitting: Family Medicine

## 2020-06-18 VITALS — BP 148/88 | HR 76 | Temp 98.1°F | Resp 16 | Ht 68.0 in | Wt 235.0 lb

## 2020-06-18 DIAGNOSIS — Z23 Encounter for immunization: Secondary | ICD-10-CM | POA: Diagnosis not present

## 2020-06-18 DIAGNOSIS — Z0000 Encounter for general adult medical examination without abnormal findings: Secondary | ICD-10-CM

## 2020-06-18 DIAGNOSIS — Z1211 Encounter for screening for malignant neoplasm of colon: Secondary | ICD-10-CM

## 2020-06-18 DIAGNOSIS — Z0001 Encounter for general adult medical examination with abnormal findings: Secondary | ICD-10-CM

## 2020-06-18 DIAGNOSIS — M797 Fibromyalgia: Secondary | ICD-10-CM

## 2020-06-18 DIAGNOSIS — J9611 Chronic respiratory failure with hypoxia: Secondary | ICD-10-CM

## 2020-06-18 DIAGNOSIS — Z1231 Encounter for screening mammogram for malignant neoplasm of breast: Secondary | ICD-10-CM

## 2020-06-18 DIAGNOSIS — I700 Atherosclerosis of aorta: Secondary | ICD-10-CM

## 2020-06-18 DIAGNOSIS — G894 Chronic pain syndrome: Secondary | ICD-10-CM

## 2020-06-18 DIAGNOSIS — E119 Type 2 diabetes mellitus without complications: Secondary | ICD-10-CM | POA: Diagnosis not present

## 2020-06-18 DIAGNOSIS — I10 Essential (primary) hypertension: Secondary | ICD-10-CM

## 2020-06-18 DIAGNOSIS — F32A Depression, unspecified: Secondary | ICD-10-CM

## 2020-06-18 DIAGNOSIS — F17200 Nicotine dependence, unspecified, uncomplicated: Secondary | ICD-10-CM

## 2020-06-18 DIAGNOSIS — D45 Polycythemia vera: Secondary | ICD-10-CM

## 2020-06-18 DIAGNOSIS — G6289 Other specified polyneuropathies: Secondary | ICD-10-CM

## 2020-06-18 DIAGNOSIS — Z6835 Body mass index (BMI) 35.0-35.9, adult: Secondary | ICD-10-CM

## 2020-06-18 MED ORDER — CHANTIX STARTING MONTH PAK 0.5 MG X 11 & 1 MG X 42 PO TABS: ORAL_TABLET | ORAL | 0 refills | Status: DC

## 2020-06-18 NOTE — Patient Instructions (Addendum)
Schedule your mammogram at [facility] Referral to GI for colonoscopy  We will call with lab results Referral to our pharmacist to help with pill packing Flu shot given  F/U 4 months

## 2020-06-18 NOTE — Progress Notes (Signed)
Subjective:   Patient presents for Medicare Annual/Subsequent preventive examination.  Pt here to f/u chronic medical problems and for well  She was has been following with hematology  polycythemia , she is getting blood drawn monthly  she is still on hydroxyurea   She getting dentures next week    DM- last A1C 6% in April, states her averages  150-160    We were not able take Jardaince due to possile cause of polycythemia  Metformin 1000mg  BID   due for eye exam    Hyperlipdemia- taking tricor /lipitor for cholesterol   She does follow with pain clinic- she is still on percocet 7.5-325mg  QID, she is no longer muscle realxer    MDD Dorna Mai- cymbalta 120mg , wellbutrin  Reviewed recent labs- renal function is good    LFT stable   She is still smoking   Review Past Medical/Family/Social: per EMR    Risk Factors  Current exercise habits: none  Dietary issues discussed: YES   Cardiac risk factors: Obesity (BMI >= 30 kg/m2). HLD, DM   Depression Screen  (Note: if answer to either of the following is "Yes", a more complete depression screening is indicated)  Over the past two weeks, have you felt down, depressed or hopeless? No Over the past two weeks, have you felt little interest or pleasure in doing things? No Have you lost interest or pleasure in daily life? No Do you often feel hopeless? No Do you cry easily over simple problems? No   Activities of Daily Living  In your present state of health, do you have any difficulty performing the following activities?:  Driving? No  Managing money? No  Feeding yourself? No  Getting from bed to chair? No  Climbing a flight of stairs? Yes   Preparing food and eating?: No  Bathing or showering? No  Getting dressed: No  Getting to the toilet? No  Using the toilet:No  Moving around from place to place: Yes   In the past year have you fallen or had a near fall?:No    Hearing Difficulties: No  Do you often ask people to  speak up or repeat themselves? No  Do you experience ringing or noises in your ears? No Do you have difficulty understanding soft or whispered voices? No  Do you feel that you have a problem with memory? No Do you often misplace items? No  Do you feel safe at home? Yes  Cognitive Testing  Alert? Yes Normal Appearance?Yes  Oriented to person? Yes Place? Yes  Time? Yes  Recall of three objects? Yes  Can perform simple calculations? Yes  Displays appropriate judgment?Yes  Can read the correct time from a watch face?Yes   List the Names of Other Physician/Practitioners you currently use:  Hematology Pain clinic  Screening Tests / Date Colonoscopy     DUE                PNA-  UTD Mammogram DUE Influenza Vaccine DUE COVID-19 - Due for Covid-19 booster  Tetanus/tdapUTD  ROS: GEN- denies fatigue, fever, weight loss,weakness, recent illness HEENT- denies eye drainage, change in vision, nasal discharge, CVS- denies chest pain, palpitations RESP- denies SOB, cough, wheeze ABD- denies N/V, change in stools, abd pain GU- denies dysuria, hematuria, dribbling, incontinence MSK- denies joint pain, muscle aches, injury Neuro- denies headache, dizziness, syncope, seizure activity  PHYSICAL: GEN- NAD, alert and oriented x3 HEENT- PERRL, EOMI, non injected sclera, pink conjunctiva, MMM, oropharynx clear Neck- Supple, no thryomegaly  CVS- RRR, no murmur RESP-CTAB, oxygen  ABD-NABS,soft,NT,ND  Psych normal affect and mood EXT- No edema Pulses- Radial, DP- 2+   Assessment:    Annual wellness medicare exam   Plan:    During the course of the visit the patient was educated and counseled about appropriate screening and preventive services including:  Screening mammography - pt to schedule    Overdue for colonoscopy- referral to GI  Immunizations- pt to get COVID booster /FLU shot given   DM- continue metformin , goal A1 <  7%, continue MTF, can not take SGLT-2 due to her  polycythemia/leukocytosis, [place] did not control blood sugars   Would benefit froM GLP-1 therapy in setting of her weight   HTn- no bp meds taken, has been controlled, no changes  Chronic pain/ fibromyaglia - continue pain clinic, cymbalta   She admits she has missed meds, or often just takes a handful all in the morning, needs help with med administration, pill packing, will refer to CCM Pharmacist   MDD- controlled on current meds  Referral for eye exam   Polycythemia- history of MPN follow up Heme/Onc   F/U 4 months         Diet review for nutrition referral? Yes ____ Not Indicated __x__  Patient Instructions (the written plan) was given to the patient.  Medicare Attestation  I have personally reviewed:  The patient's medical and social history  Their use of alcohol, tobacco or illicit drugs  Their current medications and supplements  The patient's functional ability including ADLs,fall risks, home safety risks, cognitive, and hearing and visual impairment  Diet and physical activities  Evidence for depression or mood disorders  The patient's weight, height, BMI, and visual acuity have been recorded in the chart. I have made referrals, counseling, and provided education to the patient based on review of the above and I have provided the patient with a written personalized care plan for preventive services.

## 2020-06-19 ENCOUNTER — Other Ambulatory Visit: Payer: Self-pay | Admitting: Family Medicine

## 2020-06-19 ENCOUNTER — Encounter: Payer: Self-pay | Admitting: Family Medicine

## 2020-06-19 LAB — LIPID PANEL
Cholesterol: 164 mg/dL (ref <200)
HDL: 39 mg/dL — ABNORMAL LOW (ref >=50)
LDL Cholesterol (Calc): 98 mg/dL (calc)
Non-HDL Cholesterol (Calc): 125 mg/dL (calc) (ref <130)
Total CHOL/HDL Ratio: 4.2 (calc) (ref <5.0)
Triglycerides: 171 mg/dL — ABNORMAL HIGH (ref <150)

## 2020-06-19 LAB — HEMOGLOBIN A1C
Hgb A1c MFr Bld: 7.1 % of total Hgb — ABNORMAL HIGH (ref <5.7)
Mean Plasma Glucose: 157 mg/dL
eAG (mmol/L): 8.7 mmol/L

## 2020-06-19 LAB — MICROALBUMIN / CREATININE URINE RATIO
Creatinine, Urine: 53 mg/dL (ref 20–275)
Microalb Creat Ratio: 175 mcg/mg creat — ABNORMAL HIGH (ref <30)
Microalb, Ur: 9.3 mg/dL

## 2020-06-20 NOTE — Telephone Encounter (Signed)
Request sent to pharmacy 112/22/21

## 2020-06-25 ENCOUNTER — Telehealth: Payer: Self-pay | Admitting: Family Medicine

## 2020-06-25 NOTE — Progress Notes (Signed)
  Chronic Care Management   Outreach Note  06/25/2020 Name: Katelyn Cline MRN: [id number] DOB: Sep 03, 1960  Referred by: [name], MD Reason for referral : No chief complaint on file.   An unsuccessful telephone outreach was attempted today. The patient was referred to the pharmacist for assistance with care management and care coordination.   Follow Up Plan:   Carley Perdue [employer] Scheduler

## 2020-06-27 ENCOUNTER — Telehealth: Payer: Self-pay | Admitting: Family Medicine

## 2020-06-27 NOTE — Progress Notes (Signed)
  Chronic Care Management   Outreach Note  06/27/2020 Name: Katelyn Cline MRN: [id number] DOB: Nov 29, 1960  Referred by: [name], MD Reason for referral : No chief complaint on file.   A second unsuccessful telephone outreach was attempted today. The patient was referred to pharmacist for assistance with care management and care coordination.  Follow Up Plan:   Carley Perdue [employer] Scheduler

## 2020-07-01 ENCOUNTER — Other Ambulatory Visit: Payer: Self-pay | Admitting: *Deleted

## 2020-07-01 ENCOUNTER — Telehealth: Payer: Self-pay | Admitting: Family Medicine

## 2020-07-01 MED ORDER — LISINOPRIL 5 MG PO TABS: ORAL_TABLET | ORAL | 3 refills | Status: DC

## 2020-07-01 NOTE — Progress Notes (Signed)
  Chronic Care Management   Outreach Note  07/01/2020 Name: POETRY CERRO MRN: [id number] DOB: 20-Jan-1961  Referred by: [name], MD Reason for referral : No chief complaint on file.   Third unsuccessful telephone outreach was attempted today. The patient was referred to the pharmacist for assistance with care management and care coordination.   Follow Up Plan:   Carley Perdue [employer] Scheduler

## 2020-07-02 ENCOUNTER — Telehealth: Payer: Self-pay | Admitting: Family Medicine

## 2020-07-02 NOTE — Progress Notes (Signed)
  Chronic Care Management   Outreach Note  07/02/2020 Name: Katelyn Cline MRN: [id number] DOB: Aug 01, 1960  Referred by: [name], MD Reason for referral : No chief complaint on file.   Third unsuccessful telephone outreach was attempted today. The patient was referred to the pharmacist for assistance with care management and care coordination.   Follow Up Plan:   Carley Perdue [employer] Scheduler

## 2020-07-19 ENCOUNTER — Telehealth: Payer: Self-pay | Admitting: Family Medicine

## 2020-07-19 ENCOUNTER — Telehealth: Payer: Self-pay

## 2020-07-19 NOTE — Progress Notes (Signed)
  Chronic Care Management   Outreach Note  07/19/2020 Name: BRIGETTE HOPFER MRN: [id number] DOB: 1961/02/01  Referred by: [name], MD Reason for referral : No chief complaint on file.   Third unsuccessful telephone outreach was attempted today. The patient was referred to the pharmacist for assistance with care management and care coordination.   Follow Up Plan:   Carley Perdue [employer] Scheduler

## 2020-07-19 NOTE — Telephone Encounter (Signed)
Received call from pt. stating she has appts. for Lab and Phlebotomy on Monday 07/22/20 but complaining of severe coughing, sinus drainage, fever and SOB. Encouraged pt. to get tested and let clinic know result, and that appt. on Monday will be cancelled. Pt. will call clinic to reschedule when she feels better.

## 2020-07-22 ENCOUNTER — Inpatient Hospital Stay: Payer: Medicare HMO

## 2020-09-03 ENCOUNTER — Telehealth: Payer: Self-pay | Admitting: Family Medicine

## 2020-09-03 NOTE — Progress Notes (Signed)
  Chronic Care Management   Outreach Note  09/03/2020 Name: Katelyn Cline MRN: [id number] DOB: Sep 16, 1960  Referred by: [name], MD Reason for referral : No chief complaint on file.   Third unsuccessful telephone outreach was attempted today. The patient was referred to the pharmacist for assistance with care management and care coordination.   Follow Up Plan:   Carley Perdue [employer] Scheduler

## 2020-09-13 ENCOUNTER — Telehealth: Payer: Self-pay | Admitting: Family Medicine

## 2020-09-13 NOTE — Progress Notes (Signed)
  Chronic Care Management   Outreach Note  09/13/2020 Name: SEVEN MARENGO MRN: [id number] DOB: 08/24/1960  Referred by: [name], MD Reason for referral : No chief complaint on file.   Third unsuccessful telephone outreach was attempted today. The patient was referred to the pharmacist for assistance with care management and care coordination.   Follow Up Plan:   Carley Perdue [employer] Scheduler

## 2020-09-26 ENCOUNTER — Other Ambulatory Visit: Payer: Self-pay | Admitting: Family Medicine

## 2020-09-27 ENCOUNTER — Telehealth: Payer: Self-pay | Admitting: Hematology

## 2020-09-27 NOTE — Telephone Encounter (Signed)
Scheduled appts per 3/31 sch msg. Pt aware.

## 2020-10-07 ENCOUNTER — Telehealth: Payer: Self-pay | Admitting: Family Medicine

## 2020-10-07 NOTE — Progress Notes (Signed)
  Chronic Care Management   Note  10/07/2020 Name: Katelyn Cline MRN: [id number] DOB: 1961-05-12  Katelyn Cline is a 60 y.o. year old female who is a primary care patient of Oelrichs, Modena Nunnery, MD. I reached out to [name] by phone today in response to a referral sent by [name]'s PCP, Buelah Manis, Modena Nunnery, MD.   [name] was given information about Chronic Care Management services today including:  1. CCM service includes personalized support from designated clinical staff supervised by her physician, including individualized plan of care and coordination with other care providers 2. 24/7 contact phone numbers for assistance for urgent and routine care needs. 3. Service will only be billed when office clinical staff spend 20 minutes or more in a month to coordinate care. 4. Only one practitioner may furnish and bill the service in a calendar month. 5. The patient may stop CCM services at any time (effective at the end of the month) by phone call to the office staff.   Patient agreed to services and verbal consent obtained.   Follow up plan:   Carley Perdue [employer] Scheduler

## 2020-10-09 ENCOUNTER — Telehealth: Payer: Self-pay | Admitting: Pharmacist

## 2020-10-09 NOTE — Progress Notes (Addendum)
Chronic Care Management Pharmacy Assistant   Name: Katelyn Cline  MRN: [id number] DOB: 1960-07-23  Reason for Encounter: Initial Questions  Medications: Outpatient Encounter Medications as of 10/09/2020  Medication Sig   amLODipine (NORVASC) 5 MG tablet TAKE 1 TABLET(5 MG) BY MOUTH DAILY   aspirin EC 81 MG tablet Take 81 mg by mouth daily.   atorvastatin (LIPITOR) 80 MG tablet TAKE 1 TABLET BY MOUTH DAILY AT 6 PM   Blood Glucose Monitoring Suppl (BLOOD GLUCOSE SYSTEM PAK) KIT Please dispense based on patient and insurance preference. Use as directed to monitor FSBS 2x daily. Dx: E11.9   buPROPion (WELLBUTRIN SR) 100 MG 12 hr tablet TAKE 1 TABLET(100 MG) BY MOUTH TWICE DAILY   DULoxetine (CYMBALTA) 60 MG capsule TAKE 2 CAPSULES(120 MG) BY MOUTH DAILY   fenofibrate (TRICOR) 145 MG tablet Take 1 tablet (145 mg total) by mouth daily.   fish oil-omega-3 fatty acids 1000 MG capsule Take 2 g by mouth 2 (two) times daily.   gabapentin (NEURONTIN) 100 MG capsule TAKE 1 TO 3 CAPSULES(100 TO 300 MG) BY MOUTH THREE TIMES DAILY AS NEEDED FOR NERVE PAIN   Glucose Blood (BLOOD GLUCOSE TEST STRIPS) STRP Please dispense based on patient and insurance preference. Use as directed to monitor FSBS 2x daily. Dx: E11.9   hydrochlorothiazide (HYDRODIURIL) 25 MG tablet TAKE 1 TABLET(25 MG) BY MOUTH DAILY   hydroxyurea (HYDREA) 500 MG capsule 3 tabs ($Remov'1500mg'sMtfAw$ ) orally on Monday and Thursday and 2 tabs ($Remov'1000mg'dDAfJx$ ) on the rest of the days of the week.May take with food to minimize GI side effects.   lisinopril (ZESTRIL) 5 MG tablet TAKE 1 TABLET (5 MG) BY MOUTH AT BEDTIME   metFORMIN (GLUCOPHAGE) 1000 MG tablet TAKE 1 TABLET BY MOUTH TWICE DAILY WITH A MEAL   Microlet Lancets MISC Please dispense based on patient and insurance preference. Use as directed to monitor FSBS 2x daily. Dx: E11.9   nystatin (MYCOSTATIN/NYSTOP) powder Apply 1 application topically 3 (three) times daily. PA Case: [id number] Approved 06/30/2019-  06/28/2020   nystatin cream (MYCOSTATIN) Apply 1 application topically 2 (two) times daily. To affected areas   oxyCODONE-acetaminophen (PERCOCET) 7.5-325 MG tablet Take 1 tablet by mouth 4 (four) times daily as needed for severe pain.   senna (SENOKOT) 8.6 MG TABS tablet Take 2 tablets by mouth daily as needed for mild constipation.   valACYclovir (VALTREX) 1000 MG tablet 1 tab BID prn outbreak (Patient not taking: Reported on 06/18/2020)   varenicline (CHANTIX STARTING MONTH PAK) 0.5 MG X 11 & 1 MG X 42 tablet Take one 0.5 mg tablet by mouth once daily for 3 days, then increase to one 0.5 mg tablet twice daily for 4 days, then increase to one 1 mg tablet twice daily.   No facility-administered encounter medications on file as of 10/09/2020.   Have you seen any other providers since your last visit? Patient stated no.  Any changes in your medications or health? Patient stated no.  Any side effects from any medications? Patient stated no.  Do you have an symptoms or problems not managed by your medications? Patient stated pain but she does go to a pain clinic.  Any concerns about your health right now? Patient stated no  Has your provider asked that you check blood pressure, blood sugar, or follow special diet at home? Patient stated she checks her blood sugar, and she watches her food intake.   Do you get any type of exercise on a regular  basis? Patient stated no, but she does some physical therapy on her own but does not do much movement besides going to the bathroom.   Can you think of a goal you would like to reach for your health? Patient stated better manage her pain level.   Do you have any problems getting your medications? Patient stated no.  Is there anything that you would like to discuss during the appointment? Patient stated no.  Please bring medications and supplements to appointment,Patient reminded of OTP appointment on 4/21 at 3 pm.  Follow-Up:Pharmacist  Review  [name], [place] Pharmacist Assistant [phone]

## 2020-10-11 NOTE — Progress Notes (Signed)
Chronic Care Management Pharmacy Note  10/18/2020 Name:  Katelyn Cline MRN:  [id number] DOB:  [birth year]  Subjective: Katelyn Cline is an 60 y.o. year old female who is a primary patient of No primary care provider on file..  The CCM team was consulted for assistance with disease management and care coordination needs.    Engaged with patient face to face for initial visit in response to provider referral for pharmacy case management and/or care coordination services.   Consent to Services:  The patient was given the following information about Chronic Care Management services today, agreed to services, and gave verbal consent: 1. CCM service includes personalized support from designated clinical staff supervised by the primary care provider, including individualized plan of care and coordination with other care providers 2. 24/7 contact phone numbers for assistance for urgent and routine care needs. 3. Service will only be billed when office clinical staff spend 20 minutes or more in a month to coordinate care. 4. Only one practitioner may furnish and bill the service in a calendar month. 5.The patient may stop CCM services at any time (effective at the end of the month) by phone call to the office staff. 6. The patient will be responsible for cost sharing (co-pay) of up to 20% of the service fee (after annual deductible is met). Patient agreed to services and consent obtained.  Patient Care Team: [name], MD as Consulting Physician (Hematology) [name], [facility] as Pharmacist (Pharmacist)  Recent office visits: 06/18/20 [facility]) - AWV, unable to take Jardiance due to possible cause of polycythemia.  Referral to me for help with pill packing.  Recent consult visits: None recent  Hospital visits: None in previous 6 months  Objective:  Lab Results  Component Value Date   CREATININE 0.90 05/24/2020   BUN 23 05/24/2020   GFRNONAA >60 05/24/2020   GFRAA >60  01/12/2020   NA 141 05/24/2020   K 4.6 05/24/2020   CALCIUM 10.0 05/24/2020   CO2 28 05/24/2020   GLUCOSE 154 (H) 05/24/2020    Lab Results  Component Value Date/Time   HGBA1C 7.1 (H) 06/18/2020 11:22 AM   HGBA1C 6.0 (H) 10/16/2019 12:06 PM   MICROALBUR 9.3 06/18/2020 10:49 AM   MICROALBUR 2.6 10/07/2016 09:22 AM    Last diabetic Eye exam:  Lab Results  Component Value Date/Time   HMDIABEYEEXA No Retinopathy 09/06/2017 12:00 AM    Last diabetic Foot exam: No results found for: HMDIABFOOTEX   Lab Results  Component Value Date   CHOL 164 06/18/2020   HDL 39 (L) 06/18/2020   LDLCALC 98 06/18/2020   LDLDIRECT 60 06/16/2019   TRIG 171 (H) 06/18/2020   CHOLHDL 4.2 06/18/2020    Hepatic Function Latest Ref Rng & Units 05/24/2020 04/15/2020 04/05/2020  Total Protein 6.5 - 8.1 g/dL 7.3 7.2 7.4  Albumin 3.5 - 5.0 g/dL 3.6 3.2(L) 3.4(L)  AST 15 - 41 U/L 12(L) 9(L) 18  ALT 0 - 44 U/L $Remo'13 12 31  'spYlq$ Alk Phosphatase 38 - 126 U/L 128(H) 129(H) 128(H)  Total Bilirubin 0.3 - 1.2 mg/dL 0.3 <0.2(L) 0.4    Lab Results  Component Value Date/Time   TSH 1.98 09/17/2015 08:52 AM   TSH 4.922 (H) 05/28/2015 08:58 AM   FREET4 1.2 09/17/2015 08:52 AM   FREET4 1.00 05/28/2015 08:58 AM    CBC Latest Ref Rng & Units 05/24/2020 04/15/2020 04/05/2020  WBC 4.0 - 10.5 K/uL 13.6(H) 9.1 11.3(H)  Hemoglobin 12.0 - 15.0  g/dL 15.8(H) 15.6(H) 15.9(H)  Hematocrit 36.0 - 46.0 % 53.0(H) 51.9(H) 53.5(H)  Platelets 150 - 400 K/uL 232 292 337    Lab Results  Component Value Date/Time   VD25OH 28 (L) 12/02/2012 10:33 AM   VD25OH 15 (L) 10/05/2012 10:04 AM    Clinical ASCVD: Yes  The 10-year ASCVD risk score Mikey Bussing DC Jr., et al., 2013) is: 26.9%   Values used to calculate the score:     Age: 62 years     Sex: Female     Is Non-Hispanic African American: No     Diabetic: Yes     Tobacco smoker: Yes     Systolic Blood Pressure: 196 mmHg     Is BP treated: Yes     HDL Cholesterol: 39 mg/dL     Total  Cholesterol: 164 mg/dL    Depression screen [facility] 2/9 06/18/2020 10/16/2019 07/18/2018  Decreased Interest 2 2 2   Down, Depressed, Hopeless 2 2 2   PHQ - 2 Score 4 4 4   Altered sleeping 1 2 2   Tired, decreased energy 1 2 2   Change in appetite 0 0 1  Feeling bad or failure about yourself  1 1 1   Trouble concentrating 1 2 1   Moving slowly or fidgety/restless 0 0 0  Suicidal thoughts 0 0 0  PHQ-9 Score 8 11 11   Difficult doing work/chores Somewhat difficult Somewhat difficult Very difficult  Some recent data might be hidden       Social History   Tobacco Use  Smoking Status Current Every Day Smoker  . Packs/day: 1.00  . Years: 50.00  . Pack years: 50.00  . Types: Cigarettes  Smokeless Tobacco Never Used   BP Readings from Last 3 Encounters:  06/18/20 (!) 148/88  05/24/20 (!) 135/57  04/15/20 133/70   Pulse Readings from Last 3 Encounters:  06/18/20 76  05/24/20 63  04/15/20 66   Wt Readings from Last 3 Encounters:  06/18/20 235 lb (106.6 kg)  04/05/20 234 lb 9.6 oz (106.4 kg)  11/17/19 222 lb 3.2 oz (100.8 kg)   BMI Readings from Last 3 Encounters:  06/18/20 35.73 kg/m  04/05/20 35.67 kg/m  11/17/19 33.79 kg/m    Assessment/Interventions: Review of patient past medical history, allergies, medications, health status, including review of consultants reports, laboratory and other test data, was performed as part of comprehensive evaluation and provision of chronic care management services.   SDOH:  (Social Determinants of Health) assessments and interventions performed: Yes   Financial Resource Strain: Not on file    SDOH Screenings   Alcohol Screen: Low Risk   . Last Alcohol Screening Score (AUDIT): 0  Depression (PHQ2-9): Medium Risk  . PHQ-2 Score: 8  Financial Resource Strain: Not on file  Food Insecurity: Not on file  Housing: Not on file  Physical Activity: Not on file  Social Connections: Not on file  Stress: Not on file  Tobacco Use: High Risk  .  Smoking Tobacco Use: Current Every Day Smoker  . Smokeless Tobacco Use: Never Used  Transportation Needs: Not on file    CCM Care Plan  Allergies  Allergen Reactions  . Contrast Media [Iodinated Diagnostic Agents] Anaphylaxis  . Erythromycin     Stomach pain  . Flagyl [Metronidazole] Other (See Comments)    Generalized pain.  . Latex Other (See Comments)    Was told to be careful because of Dye allergy  . Other     Iodine Dye  . Penicillins Other (See  Comments)    Patient was an infant, no idea of reaction. Tolerates Keflex. Did it involve swelling of the face/tongue/throat, SOB, or low BP? Unknown Did it involve sudden or severe rash/hives, skin peeling, or any reaction on the inside of your mouth or nose? Unknown Did you need to seek medical attention at a hospital or doctor's office? Unknown When did it last happen?infant If all above answers are "NO", may proceed with cephalosporin use.   . Tetracyclines & Related Other (See Comments)    GI side effects    Medications Reviewed Today    Reviewed by [name], [facility] (Pharmacist) on 10/18/20 at 51  Med List Status: <None>  Medication Order Taking? Sig Documenting Provider Last Dose Status Informant  amLODipine (NORVASC) 5 MG tablet [id number]  TAKE 1 TABLET(5 MG) BY MOUTH DAILY Susy Frizzle, MD  Active   aspirin EC 81 MG tablet [id number] Yes Take 81 mg by mouth daily. [name], MD Taking Active Self  atorvastatin (LIPITOR) 80 MG tablet [id number]  TAKE 1 TABLET BY MOUTH DAILY AT 6 PM Susy Frizzle, MD  Active   Blood Glucose Monitoring Suppl (BLOOD GLUCOSE SYSTEM PAK) KIT [id number] Yes Please dispense based on patient and insurance preference. Use as directed to monitor FSBS 2x daily. Dx: E11.9 [name], MD Taking Active   buPROPion [facility] SR) 100 MG 12 hr tablet [id number]  TAKE 1 TABLET(100 MG) BY MOUTH TWICE DAILY Susy Frizzle, MD  Active   DULoxetine (CYMBALTA) 60 MG  capsule [id number]  TAKE 2 CAPSULES(120 MG) BY MOUTH DAILY Susy Frizzle, MD  Active   fenofibrate (TRICOR) 145 MG tablet [id number]  Take 1 tablet (145 mg total) by mouth daily. Susy Frizzle, MD  Active   fish oil-omega-3 fatty acids 1000 MG capsule [id number] Yes Take 2 g by mouth 2 (two) times daily. [provider] Taking Active Self  gabapentin (NEURONTIN) 100 MG capsule [id number]  Take 1-3 capsules (100-300 mg total) by mouth 3 (three) times daily as needed (nerve pain.). Susy Frizzle, MD  Active   Glucose Blood (BLOOD GLUCOSE TEST STRIPS) STRP [id number] Yes Please dispense based on patient and insurance preference. Use as directed to monitor FSBS 2x daily. Dx: E11.9 [name], MD Taking Active   hydrochlorothiazide (HYDRODIURIL) 25 MG tablet [id number]  TAKE 1 TABLET(25 MG) BY MOUTH DAILY Susy Frizzle, MD  Active   hydroxyurea (HYDREA) 500 MG capsule [id number] Yes 3 tabs ($Remov'1500mg'fMedul$ ) orally on Monday and Thursday and 2 tabs ($Remov'1000mg'KQSgFr$ ) on the rest of the days of the week.May take with food to minimize GI side effects. [name], MD Taking Active   lisinopril (ZESTRIL) 5 MG tablet [id number]  TAKE 1 TABLET (5 MG) BY MOUTH AT BEDTIME Susy Frizzle, MD  Active   metFORMIN (GLUCOPHAGE) 1000 MG tablet [id number]  Take 1 tablet (1,000 mg total) by mouth 2 (two) times daily with a meal. Susy Frizzle, MD  Active   Microlet Lancets [place] [id number] Yes Please dispense based on patient and insurance preference. Use as directed to monitor FSBS 2x daily. Dx: E11.9 [name], MD Taking Active   nystatin (MYCOSTATIN/NYSTOP) powder [id number] Yes Apply 1 application topically 3 (three) times daily. PA Case: [id number] Approved 06/30/2019- 06/28/2020 [name], MD Taking Active   nystatin cream Royden Purl) [id number] Yes Apply 1 application topically 2 (two) times daily. To affected areas [place], Modena Nunnery, MD Taking Active   oxyCODONE-acetaminophen (  PERCOCET)  7.5-325 MG tablet [id number] Yes Take 1 tablet by mouth 4 (four) times daily as needed for severe pain. [place], Modena Nunnery, MD Taking Active Self  senna (SENOKOT) 8.6 MG TABS tablet [id number] Yes Take 2 tablets by mouth daily as needed for mild constipation. [provider] Taking Active Self  varenicline (CHANTIX STARTING MONTH PAK) 0.5 MG X 11 & 1 MG X 42 tablet [id number]  Take one 0.5 mg tablet by mouth once daily for 3 days, then increase to one 0.5 mg tablet twice daily for 4 days, then increase to one 1 mg tablet twice daily. Susy Frizzle, MD  Active           Patient Active Problem List   Diagnosis Date Noted  . MPN (myeloproliferative neoplasm) ([place])   . Leukocytosis 03/12/2018  . Symptoms of upper respiratory infection (URI) 03/12/2018  . Dental caries 03/12/2018  . Aortic atherosclerosis ([place]) 02/21/2018  . Constipation 05/28/2015  . OE (otitis externa) 01/04/2015  . Seborrheic keratoses 08/24/2014  . PVC (premature ventricular contraction) 05/18/2014  . Diabetes mellitus type II, controlled ([place]) 02/06/2013  . Chronic hypoxemic respiratory failure ([place]) 02/06/2013  . History of basal cell carcinoma 12/01/2012  . Posttraumatic stress disorder 12/01/2012  . History of malignant melanoma 10/18/2012  . Polycythemia vera ([place]) 08/05/2012  . Hyperlipidemia 04/09/2012  . Narcolepsy 04/09/2012  . Peripheral neuropathy 04/09/2012  . Tobacco use disorder 04/09/2012  . Obesity 04/09/2012  . Essential hypertension, benign 04/09/2012  . DDD (degenerative disc disease), lumbar 02/16/2012  . DDD (degenerative disc disease), cervical 02/16/2012  . Chronic pain disorder 02/16/2012  . Depression 02/16/2012  . Fibromyalgia 02/16/2012    Immunization History  Administered Date(s) Administered  . Hepatitis B, ped/adol 06/29/2001  . Influenza,inj,Quad PF,6+ Mos 04/07/2013, 05/18/2014, 05/28/2015, 05/13/2016, 06/11/2017, 07/18/2018, 06/16/2019, 06/18/2020  . Moderna  Sars-Covid-2 Vaccination 11/20/2019, 12/19/2019  . Pneumococcal Polysaccharide-23 12/10/2011, 10/25/2013  . Td 06/29/2001  . Tdap 12/10/2011    Conditions to be addressed/monitored: Type II DM HTN, PVC, HLD, Depression, Chronic Pain,   Care Plan : General Pharmacy (Adult)  Updates made by [name], RPH since 10/18/2020 12:00 AM    Problem: Type II DM, HTN, HLD, Depression, Chronic Pain   Priority: High  Onset Date: 10/17/2020    Long-Range Goal: Patient-Specific Goal   Start Date: 10/17/2020  Expected End Date: 04/19/2021  This Visit's Progress: On track  Priority: High  Note:   Current Barriers:  . Unable to independently monitor therapeutic efficacy . Unable to achieve control of BP, Glucose  . Unable to self administer medications as prescribed . Does not adhere to prescribed medication regimen  Pharmacist Clinical Goal(s):  Marland Kitchen Patient will achieve adherence to monitoring guidelines and medication adherence to achieve therapeutic efficacy . achieve control of BP and glucose as evidenced by home monitoring/A1c . adhere to plan to optimize therapeutic regimen for BP/DM as evidenced by report of adherence to recommended medication management changes . adhere to prescribed medication regimen as evidenced by fill date and pill pack usage through collaboration with PharmD and provider.    Interventions: . 1:1 collaboration with Buelah Manis, Modena Nunnery, MD regarding development and update of comprehensive plan of care as evidenced by provider attestation and co-signature . Inter-disciplinary care team collaboration (see longitudinal plan of care) . Comprehensive medication review performed; medication list updated in electronic medical record  Hypertension (BP goal <130/80) -Controlled -Current treatment: . Amlodipine $RemoveBefo'5mg'XEKLeftCMJH$  daily . HCTZ $Remo'25mg'nrUrM$  daily . Lisinopril $RemoveBefo'5mg'FCkYPUmnWLq$   daily -Medications previously tried: Maxzide  -Current home readings: not available, patient not  checking -Current dietary habits: she is in the process of improving her diet, she just got new teeth and is not able to eat mor fresh veggies, etc. As opposed to being restricted to softer foods -Current exercise habits: minmal -Denies hypotensive/hypertensive symptoms -Educated on BP goals and benefits of medications for prevention of heart attack, stroke and kidney damage; Daily salt intake goal < 2300 mg; Exercise goal of 150 minutes per week; Importance of home blood pressure monitoring; -Counseled to monitor BP at home a few times per week, document, and provide log at future appointments -Recommended to continue current medication Recommended monitor BP at home and contact providers if consistently >130/80.  Adherence is also an issue, patient has opted for pill packaging to help with this.  Hyperlipidemia: (LDL goal < 100, TG < 150) -Not ideally controlled -Current treatment: . Atorvastatin $RemoveBefore'80mg'WuLklqMMLMxlh$  . Fenofibrate $RemoveBefor'145mg'SmGhuUaJzSCw$  daily . Fish Oil $Remove'1000mg'YzjepYs$  2 tabs daily -Medications previously tried: none noted  -Current dietary patterns: see above -Current exercise habits: see above -Educated on Cholesterol goals;  Benefits of statin for ASCVD risk reduction; Importance of limiting foods high in cholesterol;  -Reviewed lipid panel, plans to continue positive diet changes -Recommend repeat lipid panel in June -Recommended to continue current medication  Diabetes (A1c goal <7%) -Not ideally controlled -Current medications: Marland Kitchen Metformin $RemoveBeforeDEI'1000mg'mufnXvLejMSuqGKc$  BID -Medications previously tried: Car[place] Getting -Current home glucose readings . fasting glucose: 150s . post prandial glucose: 180-200 -Denies hypoglycemic/hyperglycemic symptoms . -Current meal patterns: working on improving diet, previously consumed high carbohydrate diet because these were the easiest to eat, now she has new teeth and plans to increase fresh vegetables and decrease her carbs.   -Current exercise: minimal -Educated on A1c and  blood sugar goals; Benefits of weight loss; Prevention and management of hypoglycemic episodes; Benefits of routine self-monitoring of blood sugar; -Counseled to check feet daily and get yearly eye exams -Reports her last A1c she was going through a period of depression and did not take meds or care about her diet.  Has new positive outlook now. -Counseled on diet and exercise extensively Recommended to continue current medication  Depression/Anxiety (Goal: Minimize symptoms) -Controlled -Current treatment: . Bupropion SR $RemoveBefo'100mg'YcYGWJYTDWL$  q12h . Duloxetine $RemoveBefo'60mg'trLZlDRUiMv$  2 capsules daily  -Educated on Benefits of medication for symptom control  -Reports her mood is tremendously improved -Adherent with both medications -Recommended to continue current medication   Chronic Pain/Fibromyalgia (Goal: Improve QOL, decrease pain symptoms) -Controlled -Current treatment  . Oxycodone 7.5-$RemoveBeforeD'325mg'gQgweARpjzVOuF$  QID prn sever pain . Gabapentin $RemoveBefo'100mg'NOXgINLkNYs$  1 to 3 capsules po TID -Medications previously tried: none noted -Reports pain is somewhat controlled, she does not like to take medications for it but has to -Only takes gabapentin if really needs it -Has pain contract for this medication must get through [place] -Recommended to continue current medication   Patient Goals/Self-Care Activities . Patient will:  - take medications as prescribed check blood pressure daily, document, and provide at future appointments target a minimum of 150 minutes of moderate intensity exercise weekly engage in dietary modifications by decreasing carbs and increasing fresh vegetables  Follow Up Plan: The care management team will reach out to the patient again over the next 90 days.         Medication Assistance: None required.  Patient affirms current coverage meets needs.  Patient's preferred pharmacy is:  [id number] - [place], [place] - [address] AT  [place] [address] [place] [address] [place] [address] Phone: [phone] Fax: [phone]  [facility] - [place], [place] - [address] [place] [address] Phone: [phone] Fax: [phone]  Uses pill box? No - patient does not have organization method Pt endorses 85% compliance  We discussed: Benefits of medication synchronization, packaging and delivery as well as enhanced pharmacist oversight with [facility]. Patient decided to: Utilize [facility] for medication synchronization, packaging and delivery  Verbal consent obtained for [facility] enhanced pharmacy services (medication synchronization, adherence packaging, delivery coordination). A medication sync plan was created to allow patient to get all medications delivered once every 30 to 90 days per patient preference. Patient understands they have freedom to choose pharmacy and clinical pharmacist will coordinate care between all prescribers and [facility].   Care Plan and Follow Up Patient Decision:  Patient agrees to Care Plan and Follow-up.  Plan: The care management team will reach out to the patient again over the next 90 days.  Beverly Milch, PharmD Clinical Pharmacist [place] [phone]

## 2020-10-17 ENCOUNTER — Telehealth: Payer: Self-pay | Admitting: *Deleted

## 2020-10-17 ENCOUNTER — Ambulatory Visit (INDEPENDENT_AMBULATORY_CARE_PROVIDER_SITE_OTHER): Payer: Medicare HMO | Admitting: Pharmacist

## 2020-10-17 DIAGNOSIS — E119 Type 2 diabetes mellitus without complications: Secondary | ICD-10-CM

## 2020-10-17 DIAGNOSIS — F32A Depression, unspecified: Secondary | ICD-10-CM | POA: Diagnosis not present

## 2020-10-17 DIAGNOSIS — M5136 Other intervertebral disc degeneration, lumbar region: Secondary | ICD-10-CM

## 2020-10-17 DIAGNOSIS — G894 Chronic pain syndrome: Secondary | ICD-10-CM

## 2020-10-17 DIAGNOSIS — E782 Mixed hyperlipidemia: Secondary | ICD-10-CM | POA: Diagnosis not present

## 2020-10-17 MED ORDER — GABAPENTIN 100 MG PO CAPS: 100.0000 mg | ORAL_CAPSULE | Freq: Three times a day (TID) | ORAL | 0 refills | Status: DC | PRN

## 2020-10-17 MED ORDER — BUPROPION HCL ER (SR) 100 MG PO TB12: ORAL_TABLET | ORAL | 0 refills | Status: DC

## 2020-10-17 MED ORDER — METFORMIN HCL 1000 MG PO TABS: 1.0000 | ORAL_TABLET | Freq: Two times a day (BID) | ORAL | 0 refills | Status: DC

## 2020-10-17 MED ORDER — DULOXETINE HCL 60 MG PO CPEP: ORAL_CAPSULE | ORAL | 0 refills | Status: DC

## 2020-10-17 MED ORDER — LISINOPRIL 5 MG PO TABS: ORAL_TABLET | ORAL | 0 refills | Status: DC

## 2020-10-17 MED ORDER — AMLODIPINE BESYLATE 5 MG PO TABS: ORAL_TABLET | ORAL | 0 refills | Status: DC

## 2020-10-17 MED ORDER — FENOFIBRATE 145 MG PO TABS: 145.0000 mg | ORAL_TABLET | Freq: Every day | ORAL | 0 refills | Status: DC

## 2020-10-17 MED ORDER — CHANTIX STARTING MONTH PAK 0.5 MG X 11 & 1 MG X 42 PO TABS: ORAL_TABLET | ORAL | 0 refills | Status: DC

## 2020-10-17 MED ORDER — ATORVASTATIN CALCIUM 80 MG PO TABS: ORAL_TABLET | ORAL | 0 refills | Status: DC

## 2020-10-17 MED ORDER — HYDROCHLOROTHIAZIDE 25 MG PO TABS: ORAL_TABLET | ORAL | 0 refills | Status: DC

## 2020-10-17 NOTE — Telephone Encounter (Signed)
-----   Message from [name], [facility] sent at 10/17/2020  4:15 PM EDT ----- Marykay Lex,  We got Mrs. Randol Kern set up with [employer].  Can we please get new rx sent in for:  Amlodipine 5mg  Atorvastatin 80mg  Bupropion SR 100mg  Fenofibrate 145mg  Cymbalta 60mg  HCTZ 25mg  Lisinopril 5mg  Metformin 1000mg  Gabapentin 100mg  Chantix starting month box  Thanks!  Let me know if there's any issues. Beverly Milch, PharmD Clinical Pharmacist [place] [phone]

## 2020-10-17 NOTE — Telephone Encounter (Signed)
Prescription sent to pharmacy.

## 2020-10-18 NOTE — Patient Instructions (Addendum)
Visit Information  Goals Addressed            This Visit's Progress   . Manage My Medicine       Timeframe:  Long-Range Goal Priority:  High Start Date:  10/18/20                           Expected End Date:  04/19/21                     Follow Up Date 01/26/21   - call for medicine refill 2 or 3 days before it runs out - learn to read medicine labels - use a pillbox to sort medicine - use an alarm clock or phone to remind me to take my medicine    Why is this important?   . These steps will help you keep on track with your medicines.   Notes: Will set up with [facility] for packaging and delivery.      Patient Care Plan: General Pharmacy (Adult)    Problem Identified: Type II DM, HTN, HLD, Depression, Chronic Pain   Priority: High  Onset Date: 10/17/2020    Long-Range Goal: Patient-Specific Goal   Start Date: 10/17/2020  Expected End Date: 04/19/2021  This Visit's Progress: On track  Priority: High  Note:   Current Barriers:  . Unable to independently monitor therapeutic efficacy . Unable to achieve control of BP, Glucose  . Unable to self administer medications as prescribed . Does not adhere to prescribed medication regimen  Pharmacist Clinical Goal(s):  Marland Kitchen Patient will achieve adherence to monitoring guidelines and medication adherence to achieve therapeutic efficacy . achieve control of BP and glucose as evidenced by home monitoring/A1c . adhere to plan to optimize therapeutic regimen for BP/DM as evidenced by report of adherence to recommended medication management changes . adhere to prescribed medication regimen as evidenced by fill date and pill pack usage through collaboration with PharmD and provider.    Interventions: . 1:1 collaboration with Buelah Manis, Modena Nunnery, MD regarding development and update of comprehensive plan of care as evidenced by provider attestation and co-signature . Inter-disciplinary care team collaboration (see longitudinal plan of  care) . Comprehensive medication review performed; medication list updated in electronic medical record  Hypertension (BP goal <130/80) -Controlled -Current treatment: . Amlodipine 5mg  daily . HCTZ 25mg  daily . Lisinopril 5mg  daily -Medications previously tried: Maxzide  -Current home readings: not available, patient not checking -Current dietary habits: she is in the process of improving her diet, she just got new teeth and is not able to eat mor fresh veggies, etc. As opposed to being restricted to softer foods -Current exercise habits: minmal -Denies hypotensive/hypertensive symptoms -Educated on BP goals and benefits of medications for prevention of heart attack, stroke and kidney damage; Daily salt intake goal < 2300 mg; Exercise goal of 150 minutes per week; Importance of home blood pressure monitoring; -Counseled to monitor BP at home a few times per week, document, and provide log at future appointments -Recommended to continue current medication Recommended monitor BP at home and contact providers if consistently >130/80.  Adherence is also an issue, patient has opted for pill packaging to help with this.  Hyperlipidemia: (LDL goal < 100, TG < 150) -Not ideally controlled -Current treatment: . Atorvastatin 80mg  . Fenofibrate 145mg  daily . Fish Oil 1000mg  2 tabs daily -Medications previously tried: none noted  -Current dietary patterns: see above -Current exercise habits: see above -  Educated on Cholesterol goals;  Benefits of statin for ASCVD risk reduction; Importance of limiting foods high in cholesterol;  -Reviewed lipid panel, plans to continue positive diet changes -Recommend repeat lipid panel in June -Recommended to continue current medication  Diabetes (A1c goal <7%) -Not ideally controlled -Current medications: Marland Kitchen Metformin 1000mg  BID -Medications previously tried: Carvel Getting -Current home glucose readings . fasting glucose: 150s . post prandial  glucose: 180-200 -Denies hypoglycemic/hyperglycemic symptoms . -Current meal patterns: working on improving diet, previously consumed high carbohydrate diet because these were the easiest to eat, now she has new teeth and plans to increase fresh vegetables and decrease her carbs.   -Current exercise: minimal -Educated on A1c and blood sugar goals; Benefits of weight loss; Prevention and management of hypoglycemic episodes; Benefits of routine self-monitoring of blood sugar; -Counseled to check feet daily and get yearly eye exams -Reports her last A1c she was going through a period of depression and did not take meds or care about her diet.  Has new positive outlook now. -Counseled on diet and exercise extensively Recommended to continue current medication  Depression/Anxiety (Goal: Minimize symptoms) -Controlled -Current treatment: . Bupropion SR 100mg  q12h . Duloxetine 60mg  2 capsules daily  -Educated on Benefits of medication for symptom control  -Reports her mood is tremendously improved -Adherent with both medications -Recommended to continue current medication   Chronic Pain/Fibromyalgia (Goal: Improve QOL, decrease pain symptoms) -Controlled -Current treatment  . Oxycodone 7.5-325mg  QID prn sever pain . Gabapentin 100mg  1 to 3 capsules po TID -Medications previously tried: none noted -Reports pain is somewhat controlled, she does not like to take medications for it but has to -Only takes gabapentin if really needs it -Has pain contract for this medication must get through [place] -Recommended to continue current medication   Patient Goals/Self-Care Activities . Patient will:  - take medications as prescribed check blood pressure daily, document, and provide at future appointments target a minimum of 150 minutes of moderate intensity exercise weekly engage in dietary modifications by decreasing carbs and increasing fresh vegetables  Follow Up Plan: The care  management team will reach out to the patient again over the next 90 days.        Ms. Mcmonigle was given information about Chronic Care Management services today including:  1. CCM service includes personalized support from designated clinical staff supervised by her physician, including individualized plan of care and coordination with other care providers 2. 24/7 contact phone numbers for assistance for urgent and routine care needs. 3. Standard insurance, coinsurance, copays and deductibles apply for chronic care management only during months in which we provide at least 20 minutes of these services. Most insurances cover these services at 100%, however patients may be responsible for any copay, coinsurance and/or deductible if applicable. This service may help you avoid the need for more expensive face-to-face services. 4. Only one practitioner may furnish and bill the service in a calendar month. 5. The patient may stop CCM services at any time (effective at the end of the month) by phone call to the office staff.  Patient agreed to services and verbal consent obtained.   The patient verbalized understanding of instructions, educational materials, and care plan provided today and agreed to receive a mailed copy of patient instructions, educational materials, and care plan.  Telephone follow up appointment with pharmacy team member scheduled for: 3 months  [name], [facility]  Diabetes Mellitus and Nutrition, Adult When you have diabetes, or diabetes mellitus, it  is very important to have healthy eating habits because your blood sugar (glucose) levels are greatly affected by what you eat and drink. Eating healthy foods in the right amounts, at about the same times every day, can help you:  Control your blood glucose.  Lower your risk of heart disease.  Improve your blood pressure.  Reach or maintain a healthy weight. What can affect my meal plan? Every person with diabetes is  different, and each person has different needs for a meal plan. Your health care provider may recommend that you work with a dietitian to make a meal plan that is best for you. Your meal plan may vary depending on factors such as:  The calories you need.  The medicines you take.  Your weight.  Your blood glucose, blood pressure, and cholesterol levels.  Your activity level.  Other health conditions you have, such as heart or kidney disease. How do carbohydrates affect me? Carbohydrates, also called carbs, affect your blood glucose level more than any other type of food. Eating carbs naturally raises the amount of glucose in your blood. Carb counting is a method for keeping track of how many carbs you eat. Counting carbs is important to keep your blood glucose at a healthy level, especially if you use insulin or take certain oral diabetes medicines. It is important to know how many carbs you can safely have in each meal. This is different for every person. Your dietitian can help you calculate how many carbs you should have at each meal and for each snack. How does alcohol affect me? Alcohol can cause a sudden decrease in blood glucose (hypoglycemia), especially if you use insulin or take certain oral diabetes medicines. Hypoglycemia can be a life-threatening condition. Symptoms of hypoglycemia, such as sleepiness, dizziness, and confusion, are similar to symptoms of having too much alcohol.  Do not drink alcohol if: ? Your health care provider tells you not to drink. ? You are pregnant, may be pregnant, or are planning to become pregnant.  If you drink alcohol: ? Do not drink on an empty stomach. ? Limit how much you use to:  0-1 drink a day for women.  0-2 drinks a day for men. ? Be aware of how much alcohol is in your drink. In the [place] one drink equals one 12 oz bottle of beer (355 mL), one 5 oz glass of wine (148 mL), or one 1 oz glass of hard liquor (44 mL). ? Keep yourself  hydrated with water, diet soda, or unsweetened iced tea.  Keep in mind that regular soda, juice, and other mixers may contain a lot of sugar and must be counted as carbs. What are tips for following this plan? Reading food labels  Start by checking the serving size on the "Nutrition Facts" label of packaged foods and drinks. The amount of calories, carbs, fats, and other nutrients listed on the label is based on one serving of the item. Many items contain more than one serving per package.  Check the total grams (g) of carbs in one serving. You can calculate the number of servings of carbs in one serving by dividing the total carbs by 15. For example, if a food has 30 g of total carbs per serving, it would be equal to 2 servings of carbs.  Check the number of grams (g) of saturated fats and trans fats in one serving. Choose foods that have a low amount or none of these fats.  Check the number  of milligrams (mg) of salt (sodium) in one serving. Most people should limit total sodium intake to less than 2,300 mg per day.  Always check the nutrition information of foods labeled as "low-fat" or "nonfat." These foods may be higher in added sugar or refined carbs and should be avoided.  Talk to your dietitian to identify your daily goals for nutrients listed on the label. Shopping  Avoid buying canned, pre-made, or processed foods. These foods tend to be high in fat, sodium, and added sugar.  Shop around the outside edge of the grocery store. This is where you will most often find fresh fruits and vegetables, bulk grains, fresh meats, and fresh dairy. Cooking  Use low-heat cooking methods, such as baking, instead of high-heat cooking methods like deep frying.  Cook using healthy oils, such as olive, canola, or sunflower oil.  Avoid cooking with butter, cream, or high-fat meats. Meal planning  Eat meals and snacks regularly, preferably at the same times every day. Avoid going long periods of  time without eating.  Eat foods that are high in fiber, such as fresh fruits, vegetables, beans, and whole grains. Talk with your dietitian about how many servings of carbs you can eat at each meal.  Eat 4-6 oz (112-168 g) of lean protein each day, such as lean meat, chicken, fish, eggs, or tofu. One ounce (oz) of lean protein is equal to: ? 1 oz (28 g) of meat, chicken, or fish. ? 1 egg. ?  cup (62 g) of tofu.  Eat some foods each day that contain healthy fats, such as avocado, nuts, seeds, and fish.   What foods should I eat? Fruits Berries. Apples. Oranges. Peaches. Apricots. Plums. Grapes. Mango. Papaya. Pomegranate. Kiwi. Cherries. Vegetables Lettuce. Spinach. Leafy greens, including kale, chard, collard greens, and mustard greens. Beets. Cauliflower. Cabbage. Broccoli. Carrots. Green beans. Tomatoes. Peppers. Onions. Cucumbers. Brussels sprouts. Grains Whole grains, such as whole-wheat or whole-grain bread, crackers, tortillas, cereal, and pasta. Unsweetened oatmeal. Quinoa. Brown or wild rice. Meats and other proteins Seafood. Poultry without skin. Lean cuts of poultry and beef. Tofu. Nuts. Seeds. Dairy Low-fat or fat-free dairy products such as milk, yogurt, and cheese. The items listed above may not be a complete list of foods and beverages you can eat. Contact a dietitian for more information. What foods should I avoid? Fruits Fruits canned with syrup. Vegetables Canned vegetables. Frozen vegetables with butter or cream sauce. Grains Refined white flour and flour products such as bread, pasta, snack foods, and cereals. Avoid all processed foods. Meats and other proteins Fatty cuts of meat. Poultry with skin. Breaded or fried meats. Processed meat. Avoid saturated fats. Dairy Full-fat yogurt, cheese, or milk. Beverages Sweetened drinks, such as soda or iced tea. The items listed above may not be a complete list of foods and beverages you should avoid. Contact a dietitian  for more information. Questions to ask a health care provider  Do I need to meet with a diabetes educator?  Do I need to meet with a dietitian?  What number can I call if I have questions?  When are the best times to check my blood glucose? Where to find more information:  American Diabetes Association: diabetes.org  Academy of Nutrition and Dietetics: www.eatright.[employer] of Diabetes and Digestive and Kidney Diseases: [employer]  Association of Diabetes Care and Education Specialists: www.diabeteseducator.org Summary  It is important to have healthy eating habits because your blood sugar (glucose) levels are greatly affected by what  you eat and drink.  A healthy meal plan will help you control your blood glucose and maintain a healthy lifestyle.  Your health care provider may recommend that you work with a dietitian to make a meal plan that is best for you.  Keep in mind that carbohydrates (carbs) and alcohol have immediate effects on your blood glucose levels. It is important to count carbs and to use alcohol carefully. This information is not intended to replace advice given to you by your health care provider. Make sure you discuss any questions you have with your health care provider. Document Revised: 05/23/2019 Document Reviewed: 05/23/2019 Elsevier Patient Education  2021 [employer].

## 2020-10-21 ENCOUNTER — Other Ambulatory Visit: Payer: Self-pay

## 2020-10-21 DIAGNOSIS — D45 Polycythemia vera: Secondary | ICD-10-CM

## 2020-10-21 DIAGNOSIS — D471 Chronic myeloproliferative disease: Secondary | ICD-10-CM

## 2020-10-21 MED ORDER — HYDROXYUREA 500 MG PO CAPS: ORAL_CAPSULE | ORAL | 3 refills | Status: DC

## 2020-10-24 NOTE — Progress Notes (Signed)
HEMATOLOGY/ONCOLOGY CLINIC NOTE  Date of Service: 10/25/2020  Patient Care Team: [name], MD as Consulting Physician (Hematology) [name], [facility] as Pharmacist (Pharmacist)  CHIEF COMPLAINTS/PURPOSE OF CONSULTATION:  Continue mx of Jak2 mutation positive MPN  HISTORY OF PRESENTING ILLNESS:   Katelyn Cline is a wonderful 60 y.o. female who has been referred to [place] by Dr. Tana Coast  for evaluation and management of Leukocytosis. The pt reports that she is doing well overall.   The pt presented to the ED on 03/11/18 with significant weakness, hyperkalemia, and increasing leukocytosis with her PCP. She also notes that she had abscesses and facial infections about 4 weeks ago and received Clindamycin. She then developed a vaginal yeast infection, treated with Monistat and worsened to lesions and sores. The pt notes that since beginning antibiotics her cough has improved.   Patient report having a h/o melanoma metastatic to lung s/pwedge resection in 2014 . No recurrence since then. She notes she was been monitored with CT scans since then.  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  Patient notes that she previous needed therapeutic phlebotomy along time back but  denies ever taking medication for her polycythemia vera.   The pt notes that she used to sleep with a CPAP for many years. She then began treatment for her narcolepsy. The pt notes that she is on O2 and has remained on O2 since her surgery in 2014 to resect the melanoma. She has degenerative disc disease, fibromyalgia, and neuropathy in her feet.   The pt notes that she feels less "groggy-headed" today.   Most recent lab results (03/14/18) of CBC and BMP is as follows: all values are WNL except for WBC at 31.0k, RBC at 6.97, HCT at  47.1, MCV at 67.6, MCH at 19.7, MCHC at 29.1, RDW at 22.3, PLT at 542k, Glucose at 138.  On review of systems, pt reports recent infections, feeling tired, moving her bowels well, resolved cough and denies abdominal pains, problems passing urine, and any other symptoms.   On PMHx the pt reports basal cell carcinoma in 2013, metastatic melanoma to lung wedge resected with thoracoscopy on 10/21/12.  INTERVAL HISTORY:   Katelyn Cline returns today for management and evaluation of her polycythemia vera. The patient was lost to follow-up and her last visit with [place] was on 04/05/2020. The pt reports that she is doing well overall.  The pt reports that her last phlebotomy was in November 2021. She notes that she has been taking her Hydroxyurea. The pt notes that she went through a period of great depression, issues with her teeth she received. The pt notes they come off and she cannot get through a meal without issues with the glue. The pt notes they just do not stay in and she was instructed she need implants to hold them down. She notes this caused much depression and she was not very consistent with her medications. The pt notes that in January she tried to get an appointment but was experiencing issues with COVID-like symptoms. She was instructed by the nurse to wait six weeks after her symptoms resolved. The pt notes she has been very busy with trips to the dentist trying to find ways to fix her current denture issues with staying down. The pt notes that her  son is also experiencing issues with dentures received from the same dental clinic. The pt notes that she has been staying busy with enjoying the company of her [place].   Lab results today 10/25/2020 of CBC w/diff and CMP is as follows: all values are WNL except for WBC of 15.9K, RBC of 6.18, Hgb of 15.5, HCT of 52.5, MCH of 25.1, MCHC of 29.5, RDW of 21.2, Plt of 546K, nRBC of 0.3, Neutro Abs of 13.7K, Glucose of 230, AST of 14, Alkalaine  Phosphatase of 134. 10/25/2020 LDH of 293.  On review of systems, pt reports depression, denture issues and denies fevers, chills, night sweats, abdominal pain, and any other symptoms.  MEDICAL HISTORY:  Past Medical History:  Diagnosis Date  . Agoraphobia   . Arthritis    "all joints"  . Chronic low back pain   . Chronic respiratory failure with hypoxia ([name])    4L  via [place],  followed by pcp,   (09-16-2018  per pt only uses while at home and at night ,  portable oxygen is not working)  . COPD (chronic obstructive pulmonary disease) ([place])   . DDD (degenerative disc disease), lumbosacral   . Dental caries   . DM type 2 (diabetes mellitus, type 2) ([place])    followed by pcp  . Fibromyalgia   . GERD (gastroesophageal reflux disease)    occasional , no meds  . History of basal cell carcinoma (BCC) excision    s/p  Moh's of face/ nose 12/ 2013  . History of palpitations    per pt found to have occaional PVCs  . History of UTI   . HTN (hypertension)   . Hyperlipidemia   . Loose, teeth   . Major depression   . Metastatic melanoma ([place]) 10/18/2012   12 mm posterior right upper lobe pulmonary nodule, max SUV 3.0  s/p  right wedge resection 10-21-2012,  followed by oncologist-- dr Irene Limbo,  no recurrence  . Mixed stress and urge urinary incontinence   . Myeloproliferative neoplasm ([place])   . Narcolepsy   . OSA (obstructive sleep apnea)   . Oxygen dependent    4 L via [place],  per pt portable oxygen not working but does use while at home and at night  . Panic attacks   . Periodontitis   . Peripheral neuropathy    feet  . 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 Pulmonary nodules    bilateral small stable per CT 03-11-2018  . Scoliosis   . Smokers' cough ([place])    per pt productive a little in the morning's  . Wears glasses     SURGICAL  HISTORY: Past Surgical History:  Procedure Laterality Date  . ABDOMINAL HYSTERECTOMY  1998  . BREAST LUMPECTOMY Bilateral 1995   benign per pt  . COLONOSCOPY W/ BIOPSIES AND POLYPECTOMY     Hx: of  . CYSTECTOMY  2000   abdominal wall   . DILATION AND CURETTAGE OF UTERUS  yrs ago  . MOHS SURGERY  2013   nose/face  . MULTIPLE EXTRACTIONS WITH ALVEOLOPLASTY N/A 09/19/2018   Procedure: Extraction of tooth #'s 2, 3, 6-14, 18, and 20-30 with alveoloplasty;  Surgeon: [name], DDS;  Location: [facility];  Service: Oral Surgery;  Laterality: N/A;  GENERAL WITH NASAL TUBE  . VIDEO ASSISTED THORACOSCOPY (VATS)/WEDGE RESECTION Right 10/21/2012  Procedure: VIDEO ASSISTED THORACOSCOPY (VATS)/WEDGE RESECTION;  Surgeon: [name], MD;  Location: [facility];  Service: Thoracic;  Laterality: Right;  Marland Kitchen VIDEO BRONCHOSCOPY N/A 10/21/2012   Procedure: VIDEO BRONCHOSCOPY;  Surgeon: [name], MD;  Location: [facility];  Service: Thoracic;  Laterality: N/A;    SOCIAL HISTORY: Social History   Socioeconomic History  . Marital status: Divorced    Spouse name: Not on file  . Number of children: Not on file  . Years of education: 12+  . Highest education level: Not on file  Occupational History  . Not on file  Tobacco Use  . Smoking status: Current Every Day Smoker    Packs/day: 1.00    Years: 50.00    Pack years: 50.00    Types: Cigarettes  . Smokeless tobacco: Never Used  Vaping Use  . Vaping Use: Former  . Quit date: 09/15/2016  Substance and Sexual Activity  . Alcohol use: Yes    Comment: occasional  . Drug use: No  . Sexual activity: Not on file  Other Topics Concern  . Not on file  Social History Narrative  . Not on file   Social Determinants of Health   Financial Resource Strain: Not on file  Food Insecurity: Not on file  Transportation Needs: Not on file  Physical Activity: Not on file  Stress: Not on file  Social Connections: Not on file  Intimate Partner Violence:  Not on file    FAMILY HISTORY: Family History  Problem Relation Age of Onset  . Arthritis Mother   . Hyperlipidemia Mother   . Depression Mother   . Anxiety disorder Mother   . Dementia Mother   . Hypertension Father   . Hyperlipidemia Father   . Heart disease Father   . Stroke Father   . Dementia Father   . Heart disease Brother   . ADD / ADHD Son   . Alcohol abuse Maternal Grandfather   . Bipolar disorder Neg Hx   . Drug abuse Neg Hx   . OCD Neg Hx   . Paranoid behavior Neg Hx   . Schizophrenia Neg Hx   . Seizures Neg Hx   . Sexual abuse Neg Hx   . Physical abuse Neg Hx     ALLERGIES:  is allergic to contrast media [iodinated diagnostic agents], erythromycin, flagyl [metronidazole], latex, other, penicillins, and tetracyclines & related.  MEDICATIONS:  Current Outpatient Medications  Medication Sig Dispense Refill  . amLODipine (NORVASC) 5 MG tablet TAKE 1 TABLET(5 MG) BY MOUTH DAILY 90 tablet 0  . aspirin EC 81 MG tablet Take 81 mg by mouth daily.    Marland Kitchen atorvastatin (LIPITOR) 80 MG tablet TAKE 1 TABLET BY MOUTH DAILY AT 6 PM 90 tablet 0  . Blood Glucose Monitoring Suppl (BLOOD GLUCOSE SYSTEM PAK) KIT Please dispense based on patient and insurance preference. Use as directed to monitor FSBS 2x daily. Dx: E11.9 1 kit 1  . buPROPion (WELLBUTRIN SR) 100 MG 12 hr tablet TAKE 1 TABLET(100 MG) BY MOUTH TWICE DAILY 180 tablet 0  . DULoxetine (CYMBALTA) 60 MG capsule TAKE 2 CAPSULES(120 MG) BY MOUTH DAILY 180 capsule 0  . fenofibrate (TRICOR) 145 MG tablet Take 1 tablet (145 mg total) by mouth daily. 90 tablet 0  . fish oil-omega-3 fatty acids 1000 MG capsule Take 2 g by mouth 2 (two) times daily.    Marland Kitchen gabapentin (NEURONTIN) 100 MG capsule Take 1-3 capsules (100-300 mg total) by mouth 3 (three) times  daily as needed (nerve pain.). 180 capsule 0  . Glucose Blood (BLOOD GLUCOSE TEST STRIPS) STRP Please dispense based on patient and insurance preference. Use as directed to monitor  FSBS 2x daily. Dx: E11.9 100 strip 1  . hydrochlorothiazide (HYDRODIURIL) 25 MG tablet TAKE 1 TABLET(25 MG) BY MOUTH DAILY 90 tablet 0  . hydroxyurea (HYDREA) 500 MG capsule 3 tabs (1556m) orally on Monday and Thursday and 2 tabs (10053m on the rest of the days of the week.May take with food to minimize GI side effects. 90 capsule 3  . lisinopril (ZESTRIL) 5 MG tablet TAKE 1 TABLET (5 MG) BY MOUTH AT BEDTIME 90 tablet 0  . metFORMIN (GLUCOPHAGE) 1000 MG tablet Take 1 tablet (1,000 mg total) by mouth 2 (two) times daily with a meal. 180 tablet 0  . Microlet Lancets MISC Please dispense based on patient and insurance preference. Use as directed to monitor FSBS 2x daily. Dx: E11.9 100 each 11  . nystatin (MYCOSTATIN/NYSTOP) powder Apply 1 application topically 3 (three) times daily. PA Case: [id number] 06/30/2019- 06/28/2020 60 g 2  . nystatin cream (MYCOSTATIN) Apply 1 application topically 2 (two) times daily. To affected areas 60 g 2  . oxyCODONE-acetaminophen (PERCOCET) 7.5-325 MG tablet Take 1 tablet by mouth 4 (four) times daily as needed for severe pain.    . Marland Kitchenenna (SENOKOT) 8.6 MG TABS tablet Take 2 tablets by mouth daily as needed for mild constipation.    . varenicline (CHANTIX STARTING MONTH PAK) 0.5 MG X 11 & 1 MG X 42 tablet Take one 0.5 mg tablet by mouth once daily for 3 days, then increase to one 0.5 mg tablet twice daily for 4 days, then increase to one 1 mg tablet twice daily. 53 tablet 0   No current facility-administered medications for this visit.    REVIEW OF SYSTEMS:   10 Point review of Systems was done is negative except as noted above.  PHYSICAL EXAMINATION: ECOG PERFORMANCE STATUS: 2 - Symptomatic, <50% confined to bed  Vitals:   10/25/20 1212  BP: (!) 138/57  Pulse: 73  Resp: 18  Temp: (!) 97.4 F (36.3 C)  SpO2: 91%   Filed Weights   10/25/20 1212  Weight: 236 lb 4.8 oz (107.2 kg)   .Body mass index is 35.93 kg/m.    GENERAL:alert, in no acute  distress and comfortable SKIN: no acute rashes, no significant lesions EYES: conjunctiva are pink and non-injected, sclera anicteric OROPHARYNX: MMM, no exudates, no oropharyngeal erythema or ulceration NECK: supple, no JVD LYMPH:  no palpable lymphadenopathy in the cervical, axillary or inguinal regions LUNGS: clear to auscultation b/l with normal respiratory effort HEART: regular rate & rhythm ABDOMEN:  normoactive bowel sounds , non tender, not distended. Extremity: no pedal edema PSYCH: alert & oriented x 3 with fluent speech NEURO: no focal motor/sensory deficits  LABORATORY DATA:  I have reviewed the data as listed  . CBC Latest Ref Rng & Units 10/25/2020 05/24/2020 04/15/2020  WBC 4.0 - 10.5 K/uL 15.9(H) 13.6(H) 9.1  Hemoglobin 12.0 - 15.0 g/dL 15.5(H) 15.8(H) 15.6(H)  Hematocrit 36.0 - 46.0 % 52.5(H) 53.0(H) 51.9(H)  Platelets 150 - 400 K/uL 546(H) 232 292   . CBC    Component Value Date/Time   WBC 15.9 (H) 10/25/2020 1119   WBC 11.3 (H) 04/05/2020 1052   RBC 6.18 (H) 10/25/2020 1119   HGB 15.5 (H) 10/25/2020 1119   HCT 52.5 (H) 10/25/2020 1119   PLT 546 (H) 10/25/2020 1119  MCV 85.0 10/25/2020 1119   MCH 25.1 (L) 10/25/2020 1119   MCHC 29.5 (L) 10/25/2020 1119   RDW 21.2 (H) 10/25/2020 1119   LYMPHSABS 1.2 10/25/2020 1119   MONOABS 0.4 10/25/2020 1119   EOSABS 0.3 10/25/2020 1119   BASOSABS 0.1 10/25/2020 1119     . CMP Latest Ref Rng & Units 10/25/2020 05/24/2020 04/15/2020  Glucose 70 - 99 mg/dL 230(H) 154(H) 273(H)  BUN 8 - 23 mg/dL _0 Creatinine 0.44 - 1.00 mg/dL 0.75 0.90 0.86  Sodium 135 - 145 mmol/L 140 141 137  Potassium 3.5 - 5.1 mmol/L 4.2 4.6 3.8  Chloride 98 - 111 mmol/L 101 101 101  CO2 22 - 32 mmol/L _1 Calcium 8.9 - 10.3 mg/dL 9.8 10.0 10.2  Total Protein 6.5 - 8.1 g/dL 7.5 7.3 7.2  Total Bilirubin 0.3 - 1.2 mg/dL 0.3 0.3 <0.2(L)  Alkaline Phos 38 - 126 U/L 134(H) 128(H) 129(H)  AST 15 - 41 U/L 14(L) 12(L) 9(L)  ALT 0 - 44  U/L _2 08/15/12 JAK2 Mutation:     03/15/18 BM Bx:      RADIOGRAPHIC STUDIES: I have personally reviewed the radiological images as listed and agreed with the findings in the report. No results found.  ASSESSMENT & PLAN:   60 y.o. female with  1. Jak2 V617F mutation positive Myeloproliferative syndrome.- BM Bx consistent with Polycythemia Vera  JAK2 mutation identified in 08/15/12 labs   03/15/18 BM Bx revealed results consistent with JAK2 positive polycythemia vera   Current hgb.hct are at 14/47.6 Thrombocytosis PLT 725k  2. Increasing leucocytosis - primarily neutrophilia. - related to Polycythemia vera Patient with recent dental infections/abscesses and respiratory infection. Part of Leukocytosis could be due to his MPN  - PCV vs Myelofibrosis.  Neutrophilia began in March 2015 at 9.6k, slowly increased to 14k in August 2018, 19k in March 2019, now to [place] in September 2019.  3. Polycythemia vera Confirmed by 08/15/12 JAK 2 mutation test  4. History of Metastatic melanoma to the right lung  S/p resection by Dr. Servando Snare on 10/21/12, BRAF mutation not detected. -continue q58monthy skin screening her PCP/dermatologist -symptom directed radiologic scanning.   PLAN: -Discussed pt labwork today, 10/25/2020; Plt increased, Neutrophils and WBC increased, other counts stable, LDH stale but elevated, chemistries stable. -Emphasized importance of consistency in taking Hydroxyurea. -Will get phlebotomy as scheduled today. -The pt has no prohibitive toxicities from continuing 1500 mg Hydroxyurea 2x per week and 1000 mg every other day. -Will keep Hydroxyurea st same dosage. Will set up phlebotomies qmonthly for the next 3 months with labs. -Advised opt that with her O2 dep chronic respiratory failure , HCT goal is <48. Today it is at 52.5. -Will see back in 3 months with labs.   FOLLOW UP: Monthly therapeutic phlebotomy with labs x 4 MD visit in 3 months   The total  time spent in the appt was 20 mins minutes and more than 50% was on counseling and direct patient cares.  All of the patient's questions were answered with apparent satisfaction. The patient knows to call the clinic with any problems, questions or concerns.   GSullivan LoneMD [id number] SEncompass Health Rehabilitation Hospital Of KingsportCRegions Behavioral HospitalHematology/Oncology Physician CBapt[facility] (Office):       [phone](Work cell):  [phone](Fax):           [phone] 10/25/2020 1:00 PM  I, RReinaldo Raddle am acting as scribe for Dr.  Sullivan Lone, MD.   .I have reviewed the above documentation for accuracy and completeness, and I agree with the above. [name] MD

## 2020-10-25 ENCOUNTER — Inpatient Hospital Stay: Payer: Medicare HMO

## 2020-10-25 ENCOUNTER — Other Ambulatory Visit: Payer: Self-pay

## 2020-10-25 ENCOUNTER — Inpatient Hospital Stay (HOSPITAL_BASED_OUTPATIENT_CLINIC_OR_DEPARTMENT_OTHER): Payer: Medicare HMO | Admitting: Hematology

## 2020-10-25 ENCOUNTER — Other Ambulatory Visit: Payer: Self-pay | Admitting: Hematology

## 2020-10-25 ENCOUNTER — Inpatient Hospital Stay: Payer: Medicare HMO | Attending: Hematology

## 2020-10-25 VITALS — BP 136/53 | HR 66 | Temp 97.9°F | Resp 18

## 2020-10-25 DIAGNOSIS — D45 Polycythemia vera: Secondary | ICD-10-CM | POA: Diagnosis present

## 2020-10-25 DIAGNOSIS — D471 Chronic myeloproliferative disease: Secondary | ICD-10-CM | POA: Diagnosis not present

## 2020-10-25 LAB — CBC WITH DIFFERENTIAL (CANCER CENTER ONLY)
Abs Immature Granulocytes: 0.12 10*3/uL — ABNORMAL HIGH (ref 0.00–0.07)
Basophils Absolute: 0.1 10*3/uL (ref 0.0–0.1)
Basophils Relative: 1 %
Eosinophils Absolute: 0.3 10*3/uL (ref 0.0–0.5)
Eosinophils Relative: 2 %
HCT: 52.5 % — ABNORMAL HIGH (ref 36.0–46.0)
Hemoglobin: 15.5 g/dL — ABNORMAL HIGH (ref 12.0–15.0)
Immature Granulocytes: 1 %
Lymphocytes Relative: 8 %
Lymphs Abs: 1.2 10*3/uL (ref 0.7–4.0)
MCH: 25.1 pg — ABNORMAL LOW (ref 26.0–34.0)
MCHC: 29.5 g/dL — ABNORMAL LOW (ref 30.0–36.0)
MCV: 85 fL (ref 80.0–100.0)
Monocytes Absolute: 0.4 10*3/uL (ref 0.1–1.0)
Monocytes Relative: 3 %
Neutro Abs: 13.7 10*3/uL — ABNORMAL HIGH (ref 1.7–7.7)
Neutrophils Relative %: 85 %
Platelet Count: 546 10*3/uL — ABNORMAL HIGH (ref 150–400)
RBC: 6.18 MIL/uL — ABNORMAL HIGH (ref 3.87–5.11)
RDW: 21.2 % — ABNORMAL HIGH (ref 11.5–15.5)
WBC Count: 15.9 10*3/uL — ABNORMAL HIGH (ref 4.0–10.5)
nRBC: 0.3 % — ABNORMAL HIGH (ref 0.0–0.2)

## 2020-10-25 LAB — CMP (CANCER CENTER ONLY)
ALT: 13 U/L (ref 0–44)
AST: 14 U/L — ABNORMAL LOW (ref 15–41)
Albumin: 3.5 g/dL (ref 3.5–5.0)
Alkaline Phosphatase: 134 U/L — ABNORMAL HIGH (ref 38–126)
Anion gap: 14 (ref 5–15)
BUN: 23 mg/dL (ref 8–23)
CO2: 25 mmol/L (ref 22–32)
Calcium: 9.8 mg/dL (ref 8.9–10.3)
Chloride: 101 mmol/L (ref 98–111)
Creatinine: 0.75 mg/dL (ref 0.44–1.00)
GFR, Estimated: >60 mL/min (ref >60)
Glucose, Bld: 230 mg/dL — ABNORMAL HIGH (ref 70–99)
Potassium: 4.2 mmol/L (ref 3.5–5.1)
Sodium: 140 mmol/L (ref 135–145)
Total Bilirubin: 0.3 mg/dL (ref 0.3–1.2)
Total Protein: 7.5 g/dL (ref 6.5–8.1)

## 2020-10-25 LAB — LACTATE DEHYDROGENASE: LDH: 293 U/L — ABNORMAL HIGH (ref 98–192)

## 2020-10-25 MED ORDER — HYDROXYUREA 500 MG PO CAPS: ORAL_CAPSULE | ORAL | 3 refills | Status: DC

## 2020-10-25 NOTE — Patient Instructions (Signed)
Therapeutic Phlebotomy Therapeutic phlebotomy is the planned removal of blood from a person's body for the purpose of treating a medical condition. The procedure is similar to donating blood. Usually, about a pint (470 mL, or 0.47 L) of blood is removed. The average adult has 9-12 pints (4.3-5.7 L) of blood in the body. Therapeutic phlebotomy may be used to treat the following medical conditions:  Hemochromatosis. This is a condition in which the blood contains too much iron.  Polycythemia vera. This is a condition in which the blood contains too many red blood cells.  Porphyria cutanea tarda. This is a disease in which an important part of hemoglobin is not made properly. It results in the buildup of abnormal amounts of porphyrins in the body.  Sickle cell disease. This is a condition in which the red blood cells form an abnormal crescent shape rather than a round shape. Tell a health care provider about:  Any allergies you have.  All medicines you are taking, including vitamins, herbs, eye drops, creams, and over-the-counter medicines.  Any problems you or family members have had with anesthetic medicines.  Any blood disorders you have.  Any surgeries you have had.  Any medical conditions you have.  Whether you are pregnant or may be pregnant. What are the risks? Generally, this is a safe procedure. However, problems may occur, including:  Nausea or light-headedness.  Low blood pressure (hypotension).  Soreness, bleeding, swelling, or bruising at the needle insertion site.  Infection. What happens before the procedure?  Follow instructions from your health care provider about eating or drinking restrictions.  Ask your health care provider about: ? Changing or stopping your regular medicines. This is especially important if you are taking diabetes medicines or blood thinners (anticoagulants). ? Taking medicines such as aspirin and ibuprofen. These medicines can thin your  blood. Do not take these medicines unless your health care provider tells you to take them. ? Taking over-the-counter medicines, vitamins, herbs, and supplements.  Wear clothing with sleeves that can be raised above the elbow.  Plan to have someone take you home from the hospital or clinic.  You may have a blood sample taken.  Your blood pressure, pulse rate, and breathing rate will be measured. What happens during the procedure?   To lower your risk of infection: ? Your health care team will wash or sanitize their hands. ? Your skin will be cleaned with an antiseptic.  You may be given a medicine to numb the area (local anesthetic).  A tourniquet will be placed on your arm.  A needle will be inserted into one of your veins.  Tubing and a collection bag will be attached to that needle.  Blood will flow through the needle and tubing into the collection bag.  The collection bag will be placed lower than your arm to allow gravity to help the flow of blood into the bag.  You may be asked to open and close your hand slowly and continually during the entire collection.  After the specified amount of blood has been removed from your body, the collection bag and tubing will be clamped.  The needle will be removed from your vein.  Pressure will be held on the site of the needle insertion to stop the bleeding.  A bandage (dressing) will be placed over the needle insertion site. The procedure may vary among health care providers and hospitals. What happens after the procedure?  Your blood pressure, pulse rate, and breathing rate will be   measured after the procedure.  You will be encouraged to drink fluids.  Your recovery will be assessed and monitored.  You can return to your normal activities as told by your health care provider. Summary  Therapeutic phlebotomy is the planned removal of blood from a person's body for the purpose of treating a medical condition.  Therapeutic  phlebotomy may be used to treat hemochromatosis, polycythemia vera, porphyria cutanea tarda, or sickle cell disease.  In the procedure, a needle is inserted and about a pint (470 mL, or 0.47 L) of blood is removed. The average adult has 9-12 pints (4.3-5.7 L) of blood in the body.  This is generally a safe procedure, but it can sometimes cause problems such as nausea, light-headedness, or low blood pressure (hypotension). This information is not intended to replace advice given to you by your health care provider. Make sure you discuss any questions you have with your health care provider. Document Released: 11/17/2010 Document Revised: 07/01/2017 Document Reviewed: 07/01/2017 [employer]vier Interactive Patient Education  2019 [employer]  

## 2020-10-25 NOTE — Progress Notes (Signed)
Patient remained for 15 minutes after phlebotomy. VS stable and she declined to stay for the 30 min obs.

## 2020-10-30 ENCOUNTER — Telehealth: Payer: Self-pay | Admitting: Hematology

## 2020-10-30 NOTE — Telephone Encounter (Signed)
Left message with follow-up appointments per 4/29 los. Gave option to call back to reschedule if needed.

## 2020-11-21 ENCOUNTER — Other Ambulatory Visit: Payer: Self-pay

## 2020-11-21 DIAGNOSIS — D45 Polycythemia vera: Secondary | ICD-10-CM

## 2020-11-22 ENCOUNTER — Other Ambulatory Visit: Payer: Self-pay

## 2020-11-22 ENCOUNTER — Inpatient Hospital Stay: Payer: Medicare HMO

## 2020-11-22 ENCOUNTER — Inpatient Hospital Stay: Payer: Medicare HMO | Attending: Hematology

## 2020-11-22 DIAGNOSIS — D45 Polycythemia vera: Secondary | ICD-10-CM

## 2020-11-22 LAB — LACTATE DEHYDROGENASE: LDH: 277 U/L — ABNORMAL HIGH (ref 98–192)

## 2020-11-22 LAB — CBC WITH DIFFERENTIAL (CANCER CENTER ONLY)
Abs Immature Granulocytes: 0.15 10*3/uL — ABNORMAL HIGH (ref 0.00–0.07)
Basophils Absolute: 0.2 10*3/uL — ABNORMAL HIGH (ref 0.0–0.1)
Basophils Relative: 1 %
Eosinophils Absolute: 0.3 10*3/uL (ref 0.0–0.5)
Eosinophils Relative: 2 %
HCT: 49.3 % — ABNORMAL HIGH (ref 36.0–46.0)
Hemoglobin: 14.8 g/dL (ref 12.0–15.0)
Immature Granulocytes: 1 %
Lymphocytes Relative: 10 %
Lymphs Abs: 1.7 10*3/uL (ref 0.7–4.0)
MCH: 25.2 pg — ABNORMAL LOW (ref 26.0–34.0)
MCHC: 30 g/dL (ref 30.0–36.0)
MCV: 83.8 fL (ref 80.0–100.0)
Monocytes Absolute: 0.6 10*3/uL (ref 0.1–1.0)
Monocytes Relative: 3 %
Neutro Abs: 14.5 10*3/uL — ABNORMAL HIGH (ref 1.7–7.7)
Neutrophils Relative %: 83 %
Platelet Count: 401 10*3/uL — ABNORMAL HIGH (ref 150–400)
RBC: 5.88 MIL/uL — ABNORMAL HIGH (ref 3.87–5.11)
RDW: 20.6 % — ABNORMAL HIGH (ref 11.5–15.5)
WBC Count: 17.5 10*3/uL — ABNORMAL HIGH (ref 4.0–10.5)
nRBC: 0.2 % (ref 0.0–0.2)

## 2020-11-22 LAB — CMP (CANCER CENTER ONLY)
ALT: 12 U/L (ref 0–44)
AST: 14 U/L — ABNORMAL LOW (ref 15–41)
Albumin: 3.6 g/dL (ref 3.5–5.0)
Alkaline Phosphatase: 131 U/L — ABNORMAL HIGH (ref 38–126)
Anion gap: 13 (ref 5–15)
BUN: 19 mg/dL (ref 8–23)
CO2: 25 mmol/L (ref 22–32)
Calcium: 9.6 mg/dL (ref 8.9–10.3)
Chloride: 101 mmol/L (ref 98–111)
Creatinine: 0.71 mg/dL (ref 0.44–1.00)
GFR, Estimated: >60 mL/min (ref >60)
Glucose, Bld: 177 mg/dL — ABNORMAL HIGH (ref 70–99)
Potassium: 4.2 mmol/L (ref 3.5–5.1)
Sodium: 139 mmol/L (ref 135–145)
Total Bilirubin: 0.3 mg/dL (ref 0.3–1.2)
Total Protein: 7.2 g/dL (ref 6.5–8.1)

## 2020-11-22 NOTE — Progress Notes (Signed)
Hct today is 49.3, ok to proceed with phlebotomy per Dr.Kale.  Katelyn Cline presents today for phlebotomy per MD orders. Phlebotomy procedure started at 1302 and ended at 1307. 510 grams removed. Patient tolerated procedure well, declined to stay for 30 minute observation. IV needle removed intact.  VS WNL.   Declined po fluids or food.

## 2020-11-22 NOTE — Patient Instructions (Signed)

## 2020-12-04 ENCOUNTER — Telehealth: Payer: Self-pay

## 2020-12-04 NOTE — Progress Notes (Addendum)
    Chronic Care Management Pharmacy Assistant   Name: Katelyn Cline  MRN: [id number] DOB: 1961/05/29  Reason for Encounter: Medication Review/Medication Coordination Call  Medications: Outpatient Encounter Medications as of 12/04/2020  Medication Sig   amLODipine (NORVASC) 5 MG tablet TAKE 1 TABLET(5 MG) BY MOUTH DAILY   aspirin EC 81 MG tablet Take 81 mg by mouth daily.   atorvastatin (LIPITOR) 80 MG tablet TAKE 1 TABLET BY MOUTH DAILY AT 6 PM   Blood Glucose Monitoring Suppl (BLOOD GLUCOSE SYSTEM PAK) KIT Please dispense based on patient and insurance preference. Use as directed to monitor FSBS 2x daily. Dx: E11.9   buPROPion (WELLBUTRIN SR) 100 MG 12 hr tablet TAKE 1 TABLET(100 MG) BY MOUTH TWICE DAILY   DULoxetine (CYMBALTA) 60 MG capsule TAKE 2 CAPSULES(120 MG) BY MOUTH DAILY   fenofibrate (TRICOR) 145 MG tablet Take 1 tablet (145 mg total) by mouth daily.   fish oil-omega-3 fatty acids 1000 MG capsule Take 2 g by mouth 2 (two) times daily.   gabapentin (NEURONTIN) 100 MG capsule Take 1-3 capsules (100-300 mg total) by mouth 3 (three) times daily as needed (nerve pain.).   Glucose Blood (BLOOD GLUCOSE TEST STRIPS) STRP Please dispense based on patient and insurance preference. Use as directed to monitor FSBS 2x daily. Dx: E11.9   hydrochlorothiazide (HYDRODIURIL) 25 MG tablet TAKE 1 TABLET(25 MG) BY MOUTH DAILY   hydroxyurea (HYDREA) 500 MG capsule 3 tabs ($Remov'1500mg'YgeRkF$ ) orally on Monday and Thursday and 2 tabs ($Remov'1000mg'UHnkAF$ ) on the rest of the days of the week.May take with food to minimize GI side effects.   lisinopril (ZESTRIL) 5 MG tablet TAKE 1 TABLET (5 MG) BY MOUTH AT BEDTIME   metFORMIN (GLUCOPHAGE) 1000 MG tablet Take 1 tablet (1,000 mg total) by mouth 2 (two) times daily with a meal.   Microlet Lancets MISC Please dispense based on patient and insurance preference. Use as directed to monitor FSBS 2x daily. Dx: E11.9   nystatin (MYCOSTATIN/NYSTOP) powder Apply 1 application topically 3  (three) times daily. PA Case: [id number] Approved 06/30/2019- 06/28/2020   nystatin cream (MYCOSTATIN) Apply 1 application topically 2 (two) times daily. To affected areas   oxyCODONE-acetaminophen (PERCOCET) 7.5-325 MG tablet Take 1 tablet by mouth 4 (four) times daily as needed for severe pain.   senna (SENOKOT) 8.6 MG TABS tablet Take 2 tablets by mouth daily as needed for mild constipation.   varenicline (CHANTIX STARTING MONTH PAK) 0.5 MG X 11 & 1 MG X 42 tablet Take one 0.5 mg tablet by mouth once daily for 3 days, then increase to one 0.5 mg tablet twice daily for 4 days, then increase to one 1 mg tablet twice daily.   No facility-administered encounter medications on file as of 12/04/2020.    Reviewed chart for medication changes ahead of medication coordination call.  No OV or hospital visits since last care coordination call.  Consults Visits: 10/25/20 Oncology [name], MD. For follow-up. No medication changes.  No medication changes indicated.  BP Readings from Last 3 Encounters:  11/22/20 (!) 128/58  10/25/20 (!) 136/53  10/25/20 (!) 138/57    Lab Results  Component Value Date   HGBA1C 7.1 (H) 06/18/2020     Third unsuccessful telephone outreach was attempted today. The patient was referred to the pharmacist for assistance with care management and care coordination.   Follow-Up:Phamracsit Review  [name], [place] Pharmacist Assistant [phone]

## 2020-12-25 ENCOUNTER — Telehealth: Payer: Self-pay | Admitting: Pharmacist

## 2020-12-25 ENCOUNTER — Inpatient Hospital Stay: Payer: Medicare HMO

## 2020-12-25 ENCOUNTER — Inpatient Hospital Stay: Payer: Medicare HMO | Attending: Hematology

## 2020-12-25 ENCOUNTER — Other Ambulatory Visit: Payer: Self-pay

## 2020-12-25 VITALS — BP 137/56 | HR 68 | Temp 98.7°F | Resp 16

## 2020-12-25 DIAGNOSIS — D45 Polycythemia vera: Secondary | ICD-10-CM | POA: Insufficient documentation

## 2020-12-25 DIAGNOSIS — Z85118 Personal history of other malignant neoplasm of bronchus and lung: Secondary | ICD-10-CM | POA: Diagnosis not present

## 2020-12-25 LAB — CBC WITH DIFFERENTIAL (CANCER CENTER ONLY)
Abs Immature Granulocytes: 0.13 10*3/uL — ABNORMAL HIGH (ref 0.00–0.07)
Basophils Absolute: 0.1 10*3/uL (ref 0.0–0.1)
Basophils Relative: 1 %
Eosinophils Absolute: 0.2 10*3/uL (ref 0.0–0.5)
Eosinophils Relative: 1 %
HCT: 47.9 % — ABNORMAL HIGH (ref 36.0–46.0)
Hemoglobin: 14.2 g/dL (ref 12.0–15.0)
Immature Granulocytes: 1 %
Lymphocytes Relative: 8 %
Lymphs Abs: 1.2 10*3/uL (ref 0.7–4.0)
MCH: 25.4 pg — ABNORMAL LOW (ref 26.0–34.0)
MCHC: 29.6 g/dL — ABNORMAL LOW (ref 30.0–36.0)
MCV: 85.5 fL (ref 80.0–100.0)
Monocytes Absolute: 0.3 10*3/uL (ref 0.1–1.0)
Monocytes Relative: 2 %
Neutro Abs: 12.7 10*3/uL — ABNORMAL HIGH (ref 1.7–7.7)
Neutrophils Relative %: 87 %
Platelet Count: 453 10*3/uL — ABNORMAL HIGH (ref 150–400)
RBC: 5.6 MIL/uL — ABNORMAL HIGH (ref 3.87–5.11)
RDW: 19.7 % — ABNORMAL HIGH (ref 11.5–15.5)
WBC Count: 14.7 10*3/uL — ABNORMAL HIGH (ref 4.0–10.5)
nRBC: 0 % (ref 0.0–0.2)

## 2020-12-25 LAB — CMP (CANCER CENTER ONLY)
ALT: 11 U/L (ref 0–44)
AST: 11 U/L — ABNORMAL LOW (ref 15–41)
Albumin: 3.3 g/dL — ABNORMAL LOW (ref 3.5–5.0)
Alkaline Phosphatase: 126 U/L (ref 38–126)
Anion gap: 11 (ref 5–15)
BUN: 18 mg/dL (ref 8–23)
CO2: 26 mmol/L (ref 22–32)
Calcium: 9.9 mg/dL (ref 8.9–10.3)
Chloride: 104 mmol/L (ref 98–111)
Creatinine: 0.73 mg/dL (ref 0.44–1.00)
GFR, Estimated: >60 mL/min (ref >60)
Glucose, Bld: 176 mg/dL — ABNORMAL HIGH (ref 70–99)
Potassium: 4.2 mmol/L (ref 3.5–5.1)
Sodium: 141 mmol/L (ref 135–145)
Total Bilirubin: 0.3 mg/dL (ref 0.3–1.2)
Total Protein: 7.1 g/dL (ref 6.5–8.1)

## 2020-12-25 LAB — LACTATE DEHYDROGENASE: LDH: 289 U/L — ABNORMAL HIGH (ref 98–192)

## 2020-12-25 NOTE — Progress Notes (Addendum)
    Chronic Care Management Pharmacy Assistant   Name: Katelyn Cline  MRN: [id number] DOB: [birth year]  Reason for Encounter: Adherence Review  Reviewed the charts for any medical/health and/or medication changes there were not any changes at this time.   Follow-Up:Pharmacist Review   [name], [place] Pharmacist Assistant [phone]

## 2020-12-25 NOTE — Progress Notes (Signed)
Dr. Alen Blew was notified of hct level of 47.9 and advised to proceed with therapeutic phlebotomy. The phlebotomy began with 16G to RAC at 1306, this clotted before blood could be measured. Tim, RN attempted phlebotomy again in R wrist with 16G, which again clotted before blood could be measured. Patient was advised to come to back for next scheduled phlebotomy appointment. Patient left with no complaints and was ambulatory upon discharge.

## 2020-12-25 NOTE — Patient Instructions (Signed)

## 2020-12-30 ENCOUNTER — Other Ambulatory Visit: Payer: Self-pay | Admitting: Hematology

## 2020-12-30 DIAGNOSIS — D471 Chronic myeloproliferative disease: Secondary | ICD-10-CM

## 2020-12-30 DIAGNOSIS — D45 Polycythemia vera: Secondary | ICD-10-CM

## 2020-12-31 ENCOUNTER — Encounter: Payer: Self-pay | Admitting: Hematology

## 2021-01-03 ENCOUNTER — Telehealth: Payer: Self-pay | Admitting: Pharmacist

## 2021-01-03 NOTE — Progress Notes (Addendum)
Chronic Care Management Pharmacy Assistant   Name: Katelyn Cline  MRN: [id number] DOB: 10-21-1960  Reason for Encounter: Medication Review/Medication Coordination Call.  Medications: Outpatient Encounter Medications as of 01/03/2021  Medication Sig   amLODipine (NORVASC) 5 MG tablet TAKE 1 TABLET(5 MG) BY MOUTH DAILY   aspirin EC 81 MG tablet Take 81 mg by mouth daily.   atorvastatin (LIPITOR) 80 MG tablet TAKE 1 TABLET BY MOUTH DAILY AT 6 PM   Blood Glucose Monitoring Suppl (BLOOD GLUCOSE SYSTEM PAK) KIT Please dispense based on patient and insurance preference. Use as directed to monitor FSBS 2x daily. Dx: E11.9   buPROPion (WELLBUTRIN SR) 100 MG 12 hr tablet TAKE 1 TABLET(100 MG) BY MOUTH TWICE DAILY   DULoxetine (CYMBALTA) 60 MG capsule TAKE 2 CAPSULES(120 MG) BY MOUTH DAILY   fenofibrate (TRICOR) 145 MG tablet Take 1 tablet (145 mg total) by mouth daily.   fish oil-omega-3 fatty acids 1000 MG capsule Take 2 g by mouth 2 (two) times daily.   gabapentin (NEURONTIN) 100 MG capsule Take 1-3 capsules (100-300 mg total) by mouth 3 (three) times daily as needed (nerve pain.).   Glucose Blood (BLOOD GLUCOSE TEST STRIPS) STRP Please dispense based on patient and insurance preference. Use as directed to monitor FSBS 2x daily. Dx: E11.9   hydrochlorothiazide (HYDRODIURIL) 25 MG tablet TAKE 1 TABLET(25 MG) BY MOUTH DAILY   hydroxyurea (HYDREA) 500 MG capsule TAKE THREE CAPSULES ONCE WEEKLY ON MONDAY and TAKE TWO CAPSULES ONCE WEEKLY ON TUESDAY and TAKE TWO CAPSULES ONCE WEEKLY ON WEDNESDAY and TAKE THREE CAPSULES ONCE WEEKLY ON THURSDAY and TAKE TWO CAPSULES ONCE WEEKLY ON FRIDAY and TAKE TWO CAPSULES ONCE WEEKLY ON SATURDAY and TAKE TWO CAPSULES ONCE WEEKLY ON SUNDAY   lisinopril (ZESTRIL) 5 MG tablet TAKE 1 TABLET (5 MG) BY MOUTH AT BEDTIME   metFORMIN (GLUCOPHAGE) 1000 MG tablet Take 1 tablet (1,000 mg total) by mouth 2 (two) times daily with a meal.   Microlet Lancets MISC Please dispense  based on patient and insurance preference. Use as directed to monitor FSBS 2x daily. Dx: E11.9   nystatin (MYCOSTATIN/NYSTOP) powder Apply 1 application topically 3 (three) times daily. PA Case: [id number] Approved 06/30/2019- 06/28/2020   nystatin cream (MYCOSTATIN) Apply 1 application topically 2 (two) times daily. To affected areas   oxyCODONE-acetaminophen (PERCOCET) 7.5-325 MG tablet Take 1 tablet by mouth 4 (four) times daily as needed for severe pain.   senna (SENOKOT) 8.6 MG TABS tablet Take 2 tablets by mouth daily as needed for mild constipation.   varenicline (CHANTIX STARTING MONTH PAK) 0.5 MG X 11 & 1 MG X 42 tablet Take one 0.5 mg tablet by mouth once daily for 3 days, then increase to one 0.5 mg tablet twice daily for 4 days, then increase to one 1 mg tablet twice daily.   No facility-administered encounter medications on file as of 01/03/2021.   Reviewed chart for medication changes ahead of medication coordination call.  No OVs, Consults, or hospital visits since last adherence review.  No medication changes indicated.  BP Readings from Last 3 Encounters:  12/25/20 (!) 137/56  11/22/20 (!) 128/58  10/25/20 (!) 136/53    Lab Results  Component Value Date   HGBA1C 7.1 (H) 06/18/2020     Patient obtains medications through Adherence Packaging  30 Days   Last adherence delivery included:  Hydroxyurea 500 mg 2 tablets at breakfast  Amlodipine 5 mg 1 tablet at bedtime Duloxetine 60 mg 2  tablets at breakfast  Fenofibrate 145 mg 1 tablet at bedtime Hctz 25 mg 1 tablet at bedtime  Lisinopril 5 mg 1 tablet at bedtime Burpopion Sr 100 mg  tablet at breakfast and 1 tablet at bedtime Atorvastatin 80 mg 1 tablet at breakfast Metformin 1000 mg 1 tablet at breakfast and 2 tablets at bedtime. Gabapentin 100 mg 1-3 three times daily as needed  Patient declined meds last month  None  Patient is due for next adherence delivery on: 01/15/21.  Called patient and reviewed medications  and coordinated delivery.  This delivery to include: Hydroxyurea 500 mg 2 tablets at breakfast  Amlodipine 5 mg 1 tablet at bedtime Duloxetine 60 mg 2 tablets at breakfast  Fenofibrate 145 mg 1 tablet at bedtime Hctz 25 mg 1 tablet at bedtime  Lisinopril 5 mg 1 tablet at bedtime Burpopion Sr 100 mg  tablet at breakfast and 1 tablet at bedtime Atorvastatin 80 mg 1 tablet at breakfast Metformin 1000 mg 1 tablet at breakfast and 2 tablets at bedtime. Gabapentin 100 mg 1-3 three times daily as needed  Patient declined the following medications: None  Patient does not need refills at this time.  Confirmed delivery date of 01/15/21, advised patient that pharmacy will contact them the morning of delivery.  Follow-Up:Pharmacist Review   [name], RMA Clinical Pharmacist Assistant [phone]   10 minutes spent in review, coordination, and documentation.  Reviewed by: Beverly Milch, PharmD Clinical Pharmacist [facility] [phone]

## 2021-01-06 ENCOUNTER — Other Ambulatory Visit: Payer: Self-pay | Admitting: Family Medicine

## 2021-01-08 ENCOUNTER — Other Ambulatory Visit: Payer: Self-pay | Admitting: Family Medicine

## 2021-01-09 ENCOUNTER — Other Ambulatory Visit: Payer: Self-pay | Admitting: *Deleted

## 2021-01-09 MED ORDER — AMLODIPINE BESYLATE 5 MG PO TABS: ORAL_TABLET | ORAL | 0 refills | Status: DC

## 2021-01-09 MED ORDER — METFORMIN HCL 1000 MG PO TABS: 1000.0000 mg | ORAL_TABLET | Freq: Two times a day (BID) | ORAL | 0 refills | Status: DC

## 2021-01-09 MED ORDER — LISINOPRIL 5 MG PO TABS: ORAL_TABLET | ORAL | 0 refills | Status: DC

## 2021-01-09 MED ORDER — BUPROPION HCL ER (SR) 100 MG PO TB12: ORAL_TABLET | ORAL | 0 refills | Status: DC

## 2021-01-09 MED ORDER — ATORVASTATIN CALCIUM 80 MG PO TABS: ORAL_TABLET | ORAL | 0 refills | Status: DC

## 2021-01-17 ENCOUNTER — Telehealth: Payer: Self-pay

## 2021-01-17 NOTE — Telephone Encounter (Signed)
Patient wants to transfer care to NP Noemi Chapel. Please advise at [phone]. Staff message sent to provider.

## 2021-01-17 NOTE — Telephone Encounter (Signed)
Patient called back and stated she relies heavily on our pharmacist Gerald Stabs which is another reason why she wants to stay with this practice.  Please advise at [phone].

## 2021-01-20 ENCOUNTER — Telehealth: Payer: Self-pay

## 2021-01-23 ENCOUNTER — Other Ambulatory Visit: Payer: Self-pay

## 2021-01-23 DIAGNOSIS — D45 Polycythemia vera: Secondary | ICD-10-CM

## 2021-01-23 NOTE — Progress Notes (Signed)
HEMATOLOGY/ONCOLOGY CLINIC NOTE  Date of Service: 01/23/2021  Patient Care Team: [name], MD as Consulting Physician (Hematology) [name], [facility] as Pharmacist (Pharmacist)  CHIEF COMPLAINTS/PURPOSE OF CONSULTATION:  Continue mx of Jak2 mutation positive MPN  HISTORY OF PRESENTING ILLNESS:   Katelyn Cline is a wonderful 60 y.o. female who has been referred to [place] by Dr. Tana Coast  for evaluation and management of Leukocytosis. The pt reports that she is doing well overall.   The pt presented to the ED on 03/11/18 with significant weakness, hyperkalemia, and increasing leukocytosis with her PCP. She also notes that she had abscesses and facial infections about 4 weeks ago and received Clindamycin. She then developed a vaginal yeast infection, treated with Monistat and worsened to lesions and sores. The pt notes that since beginning antibiotics her cough has improved.   Patient report having a h/o melanoma metastatic to lung s/pwedge resection in 2014 . No recurrence since then. She notes she was been monitored with CT scans since then.  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  Patient notes that she previous needed therapeutic phlebotomy along time back but  denies ever taking medication for her polycythemia vera.   The pt notes that she used to sleep with a CPAP for many years. She then began treatment for her narcolepsy. The pt notes that she is on O2 and has remained on O2 since her surgery in 2014 to resect the melanoma. She has degenerative disc disease, fibromyalgia, and neuropathy in her feet.   The pt notes that she feels less "groggy-headed" today.   Most recent lab results (03/14/18) of CBC and BMP is as follows: all values are WNL except for WBC at 31.0k, RBC at 6.97, HCT at  47.1, MCV at 67.6, MCH at 19.7, MCHC at 29.1, RDW at 22.3, PLT at 542k, Glucose at 138.  On review of systems, pt reports recent infections, feeling tired, moving her bowels well, resolved cough and denies abdominal pains, problems passing urine, and any other symptoms.   On PMHx the pt reports basal cell carcinoma in 2013, metastatic melanoma to lung wedge resected with thoracoscopy on 10/21/12.  INTERVAL HISTORY:   Katelyn Cline returns today for management and evaluation of her polycythemia vera. The patient was lost to follow-up and her last visit with [place] was on 10/25/2020. The pt reports that she is doing well overall.  The pt reports ongoing dental issues but no acute infections at this time.  She notes that she has been taking hydroxyurea more regularly now without any toxicities.  Lab results today 01/24/2021 of CBC w/diff shows a hematocrit of 50 WBC count of 11.2 k and a normal platelet count of 209 k CMP unremarkable except for hyperglycemia with blood sugar of 226  On review of systems, pt reports no infection issues no GI intolerance of hydroxyurea no skin rashes.  No new bone pains.  No other acute new symptoms   MEDICAL HISTORY:  Past Medical History:  Diagnosis Date   Agoraphobia    Arthritis    "all joints"   Chronic low back pain    Chronic respiratory failure with hypoxia ([name])    4L  via [place],  followed by pcp,   (09-16-2018  per pt only uses while at home and at  night ,  portable oxygen is not working)   COPD (chronic obstructive pulmonary disease) ([place])    DDD (degenerative disc disease), lumbosacral    Dental caries    DM type 2 (diabetes mellitus, type 2) ([place])    followed by pcp   Fibromyalgia    GERD (gastroesophageal reflux disease)    occasional , no meds   History of basal cell carcinoma (BCC) excision    s/p  Moh's of face/ nose 12/ 2013   History of palpitations    per pt found to have occaional PVCs   History of UTI    HTN (hypertension)     Hyperlipidemia    Loose, teeth    Major depression    Metastatic melanoma ([place]) 10/18/2012   12 mm posterior right upper lobe pulmonary nodule, max SUV 3.0  s/p  right wedge resection 10-21-2012,  followed by oncologist-- dr Irene Limbo,  no recurrence   Mixed stress and urge urinary incontinence    Myeloproliferative neoplasm ([place])    Narcolepsy    OSA (obstructive sleep apnea)    Oxygen dependent    4 L via [place],  per pt portable oxygen not working but does use while at home and at night   Panic attacks    Periodontitis    Peripheral neuropathy    feet   Polycythemia vera(238.4) hematology/ oncology-- dr Irene Limbo ([facility])   first dx 02014 due to smoker/copd--  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)  hx phlebotomies   Pulmonary nodules    bilateral small stable per CT 03-11-2018   Scoliosis    Smokers' cough ([place])    per pt productive a little in the morning's   Wears glasses     SURGICAL HISTORY: Past Surgical History:  Procedure Laterality Date   ABDOMINAL HYSTERECTOMY  1998   BREAST LUMPECTOMY Bilateral 1995   benign per pt   COLONOSCOPY W/ BIOPSIES AND POLYPECTOMY     Hx: of   CYSTECTOMY  2000   abdominal wall    DILATION AND CURETTAGE OF UTERUS  yrs ago   MOHS SURGERY  2013   nose/face   MULTIPLE EXTRACTIONS WITH ALVEOLOPLASTY N/A 09/19/2018   Procedure: Extraction of tooth #'s 2, 3, 6-14, 18, and 20-30 with alveoloplasty;  Surgeon: [name], DDS;  Location: [facility];  Service: Oral Surgery;  Laterality: N/A;  GENERAL WITH NASAL TUBE   VIDEO ASSISTED THORACOSCOPY (VATS)/WEDGE RESECTION Right 10/21/2012   Procedure: VIDEO ASSISTED THORACOSCOPY (VATS)/WEDGE RESECTION;  Surgeon: [name], MD;  Location: [name];  Service: Thoracic;  Laterality: Right;   VIDEO BRONCHOSCOPY N/A 10/21/2012   Procedure: VIDEO BRONCHOSCOPY;  Surgeon: [name], MD;  Location: [facility];  Service: Thoracic;  Laterality: N/A;    SOCIAL HISTORY: Social  History   Socioeconomic History   Marital status: Divorced    Spouse name: Not on file   Number of children: Not on file   Years of education: 12+   Highest education level: Not on file  Occupational History   Not on file  Tobacco Use   Smoking status: Every Day    Packs/day: 1.00    Years: 50.00    Pack years: 50.00    Types: Cigarettes   Smokeless tobacco: Never  Vaping Use   Vaping Use: Former   Quit date: 09/15/2016  Substance and Sexual Activity   Alcohol use: Yes    Comment: occasional   Drug use: No   Sexual activity: Not  on file  Other Topics Concern   Not on file  Social History Narrative   Not on file   Social Determinants of Health   Financial Resource Strain: Not on file  Food Insecurity: Not on file  Transportation Needs: Not on file  Physical Activity: Not on file  Stress: Not on file  Social Connections: Not on file  Intimate Partner Violence: Not on file    FAMILY HISTORY: Family History  Problem Relation Age of Onset   Arthritis Mother    Hyperlipidemia Mother    Depression Mother    Anxiety disorder Mother    Dementia Mother    Hypertension Father    Hyperlipidemia Father    Heart disease Father    Stroke Father    Dementia Father    Heart disease Brother    ADD / ADHD Son    Alcohol abuse Maternal Grandfather    Bipolar disorder Neg Hx    Drug abuse Neg Hx    OCD Neg Hx    Paranoid behavior Neg Hx    Schizophrenia Neg Hx    Seizures Neg Hx    Sexual abuse Neg Hx    Physical abuse Neg Hx     ALLERGIES:  is allergic to contrast media [iodinated diagnostic agents], erythromycin, flagyl [metronidazole], latex, other, penicillins, and tetracyclines & related.  MEDICATIONS:  Current Outpatient Medications  Medication Sig Dispense Refill   amLODipine (NORVASC) 5 MG tablet TAKE 1 TABLET(5 MG) BY MOUTH DAILY 90 tablet 0   aspirin EC 81 MG tablet Take 81 mg by mouth daily.     atorvastatin (LIPITOR) 80 MG tablet TAKE 1 TABLET BY MOUTH  DAILY AT 6 PM 90 tablet 0   Blood Glucose Monitoring Suppl (BLOOD GLUCOSE SYSTEM PAK) KIT Please dispense based on patient and insurance preference. Use as directed to monitor FSBS 2x daily. Dx: E11.9 1 kit 1   buPROPion (WELLBUTRIN SR) 100 MG 12 hr tablet TAKE 1 TABLET(100 MG) BY MOUTH TWICE DAILY 180 tablet 0   DULoxetine (CYMBALTA) 60 MG capsule TAKE TWO CAPSULES BY MOUTH EVERY MORNING 180 capsule 0   fenofibrate (TRICOR) 145 MG tablet TAKE ONE TABLET BY MOUTH EVERYDAY AT BEDTIME 90 tablet 0   fish oil-omega-3 fatty acids 1000 MG capsule Take 2 g by mouth 2 (two) times daily.     gabapentin (NEURONTIN) 100 MG capsule Take 1-3 CAPSULES BY MOUTH THREE times daily AS NEEDED FOR nerve pain 180 capsule 0   Glucose Blood (BLOOD GLUCOSE TEST STRIPS) STRP Please dispense based on patient and insurance preference. Use as directed to monitor FSBS 2x daily. Dx: E11.9 100 strip 1   hydrochlorothiazide (HYDRODIURIL) 25 MG tablet TAKE ONE TABLET BY MOUTH EVERY MORNING 90 tablet 0   hydroxyurea (HYDREA) 500 MG capsule TAKE THREE CAPSULES ONCE WEEKLY ON MONDAY and TAKE TWO CAPSULES ONCE WEEKLY ON TUESDAY and TAKE TWO CAPSULES ONCE WEEKLY ON WEDNESDAY and TAKE THREE CAPSULES ONCE WEEKLY ON THURSDAY and TAKE TWO CAPSULES ONCE WEEKLY ON FRIDAY and TAKE TWO CAPSULES ONCE WEEKLY ON SATURDAY and TAKE TWO CAPSULES ONCE WEEKLY ON SUNDAY 90 capsule 3   lisinopril (ZESTRIL) 5 MG tablet TAKE 1 TABLET (5 MG) BY MOUTH AT BEDTIME 90 tablet 0   metFORMIN (GLUCOPHAGE) 1000 MG tablet Take 1 tablet (1,000 mg total) by mouth 2 (two) times daily with a meal. 180 tablet 0   Microlet Lancets MISC Please dispense based on patient and insurance preference. Use as directed to monitor FSBS  2x daily. Dx: E11.9 100 each 11   nystatin (MYCOSTATIN/NYSTOP) powder Apply 1 application topically 3 (three) times daily. PA Case: [id number] Approved 06/30/2019- 06/28/2020 60 g 2   nystatin cream (MYCOSTATIN) Apply 1 application topically 2 (two) times  daily. To affected areas 60 g 2   oxyCODONE-acetaminophen (PERCOCET) 7.5-325 MG tablet Take 1 tablet by mouth 4 (four) times daily as needed for severe pain.     senna (SENOKOT) 8.6 MG TABS tablet Take 2 tablets by mouth daily as needed for mild constipation.     varenicline (CHANTIX STARTING MONTH PAK) 0.5 MG X 11 & 1 MG X 42 tablet Take one 0.5 mg tablet by mouth once daily for 3 days, then increase to one 0.5 mg tablet twice daily for 4 days, then increase to one 1 mg tablet twice daily. 53 tablet 0   No current facility-administered medications for this visit.    REVIEW OF SYSTEMS:   10 Point review of Systems was done is negative except as noted above.  PHYSICAL EXAMINATION: ECOG PERFORMANCE STATUS: 2 - Symptomatic, <50% confined to bed  Vitals:   01/24/21 1225  BP: 136/67  Pulse: 88  Resp: 19  Temp: 97.9 F (36.6 C)  SpO2: 95%    Filed Weights   01/24/21 1225  Weight: 231 lb 1.6 oz (104.8 kg)    .Body mass index is 35.14 kg/m.   NAD GENERAL:alert, in no acute distress and comfortable SKIN: no acute rashes, no significant lesions EYES: conjunctiva are pink and non-injected, sclera anicteric OROPHARYNX: MMM, no exudates, no oropharyngeal erythema or ulceration NECK: supple, no JVD LYMPH:  no palpable lymphadenopathy in the cervical, axillary or inguinal regions LUNGS: clear to auscultation b/l with normal respiratory effort HEART: regular rate & rhythm ABDOMEN:  normoactive bowel sounds , non tender, not distended. Extremity: no pedal edema PSYCH: alert & oriented x 3 with fluent speech NEURO: no focal motor/sensory deficits   LABORATORY DATA:  I have reviewed the data as listed  . CBC Latest Ref Rng & Units 01/24/2021 12/25/2020 11/22/2020  WBC 4.0 - 10.5 K/uL 11.2(H) 14.7(H) 17.5(H)  Hemoglobin 12.0 - 15.0 g/dL 15.1(H) 14.2 14.8  Hematocrit 36.0 - 46.0 % 50.0(H) 47.9(H) 49.3(H)  Platelets 150 - 400 K/uL 209 453(H) 401(H)   . CBC    Component Value  Date/Time   WBC 11.2 (H) 01/24/2021 1158   WBC 11.3 (H) 04/05/2020 1052   RBC 5.79 (H) 01/24/2021 1158   HGB 15.1 (H) 01/24/2021 1158   HCT 50.0 (H) 01/24/2021 1158   PLT 209 01/24/2021 1158   MCV 86.4 01/24/2021 1158   MCH 26.1 01/24/2021 1158   MCHC 30.2 01/24/2021 1158   RDW 19.8 (H) 01/24/2021 1158   LYMPHSABS 1.2 01/24/2021 1158   MONOABS 0.4 01/24/2021 1158   EOSABS 0.2 01/24/2021 1158   BASOSABS 0.1 01/24/2021 1158     . CMP Latest Ref Rng & Units 01/24/2021 12/25/2020 11/22/2020  Glucose 70 - 99 mg/dL 226(H) 176(H) 177(H)  BUN 8 - 23 mg/dL _0 Creatinine 0.44 - 1.00 mg/dL 0.78 0.73 0.71  Sodium 135 - 145 mmol/L 140 141 139  Potassium 3.5 - 5.1 mmol/L 4.0 4.2 4.2  Chloride 98 - 111 mmol/L 98 104 101  CO2 22 - 32 mmol/L _1 Calcium 8.9 - 10.3 mg/dL 10.1 9.9 9.6  Total Protein 6.5 - 8.1 g/dL 7.6 7.1 7.2  Total Bilirubin 0.3 - 1.2 mg/dL 0.5 0.3 0.3  Alkaline Phos  38 - 126 U/L 110 126 131(H)  AST 15 - 41 U/L 10(L) 11(L) 14(L)  ALT 0 - 44 U/L _0 08/15/12 JAK2 Mutation:     03/15/18 BM Bx:      RADIOGRAPHIC STUDIES: I have personally reviewed the radiological images as listed and agreed with the findings in the report. No results found.  ASSESSMENT & PLAN:   60 y.o. female with  1. Jak2 V617F mutation positive Myeloproliferative syndrome.- BM Bx consistent with Polycythemia Vera  JAK2 mutation identified in 08/15/12 labs   03/15/18 BM Bx revealed results consistent with JAK2 positive polycythemia vera   2. Increasing leucocytosis - primarily neutrophilia. - related to Polycythemia vera Patient with previous dental infections/abscesses and respiratory infection. Part of Leukocytosis could be due to his MPN  - PCV vs Myelofibrosis.  Neutrophilia began in March 2015 at 9.6k, slowly increased to 14k in August 2018, 19k in March 2019, now to [place] in September 2019.  3. Polycythemia vera Confirmed by 08/15/12 JAK 2 mutation test  4. History of  Metastatic melanoma to the right lung  S/p resection by Dr. Servando Snare on 10/21/12, BRAF mutation not detected. -continue q22monthy skin screening her PCP/dermatologist -symptom directed radiologic scanning.   5.  Past Medical History:  Diagnosis Date   Agoraphobia    Arthritis    "all joints"   Chronic low back pain    Chronic respiratory failure with hypoxia ([name])    4L  via [place],  followed by pcp,   (09-16-2018  per pt only uses while at home and at night ,  portable oxygen is not working)   [place]D ([place] disease) [place]    DDD (degenerative disc disease), lumbosacral    Dental caries    DM type 2 (diabetes mellitus, type 2) ([place]    followed by pcp   Fibromyalgia    GERD (gastroesophageal reflux disease)    occasional , no meds   History of basal cell carcinoma (BCC) excision    s/p  Moh's of face/ nose 12/ 2013   History of palpitations    per pt found to have occaional PVCs   History of UTI    HTN (hypertension)    Hyperlipidemia    Loose, teeth    Major depression    [place]   12 mm posterior right upper lobe pulmonary nodule, max SUV 3.0  s/p  right wedge resection 10-21-2012,  followed by oncologist-- dr kIrene Limbo  no recurrence   Mixed stress and urge urinary incontinence    Myeloproliferative neoplasm ([place]    Narcolepsy    OSA (obstructive sleep apnea)    Oxygen dependent    4 L via [place],  per pt portable oxygen not working but does use while at home and at night   Panic attacks    Periodontitis    Peripheral neuropathy    feet   Polycythemia vera(238.4) hematology/ oncology-- dr kIrene Limbo([facility])   first dx 02014 due to smoker/copd--  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)  hx phlebotomies   Pulmonary nodules    bilateral small stable per CT 03-11-2018   Scoliosis    Smokers' cough ([place]    per pt productive a little in the morning's   Wears glasses     PLAN: -Discussed pt labwork  today, 01/24/2021; discussed with the patient as noted above -Will get phlebotomy as scheduled today. -We will reduce hydroxyurea to 1000 mg orally daily.  Was previously on 1500 mg 2 days a week in addition to 1000 mg on other days of the week. Will set up phlebotomies qmonthly for the next 3 months with labs for HCT >47 ( given COPD related compensation)  FOLLOW UP: Monthly therapeutic phlebotomy with labs x 4 MD visit in 3 months     The total time spent in the appt was 20 mins minutes and more than 50% was on counseling and direct patient cares.  All of the patient's questions were answered with apparent satisfaction. The patient knows to call the clinic with any problems, questions or concerns.   Sullivan Lone MD [place] AAHIVMS [facility] Hematology/Oncology Physician [facility]  (Office):       [phone] (Work cell):  [phone] (Fax):           [phone]  01/23/2021 10:38 PM  I, Reinaldo Raddle, am acting as scribe for Dr. Sullivan Lone, MD.    .I have reviewed the above documentation for accuracy and completeness, and I agree with the above. [name] MD

## 2021-01-24 ENCOUNTER — Inpatient Hospital Stay: Payer: Medicare HMO

## 2021-01-24 ENCOUNTER — Inpatient Hospital Stay: Payer: Medicare HMO | Attending: Hematology

## 2021-01-24 ENCOUNTER — Other Ambulatory Visit: Payer: Self-pay

## 2021-01-24 ENCOUNTER — Inpatient Hospital Stay: Payer: Medicare HMO | Admitting: Hematology

## 2021-01-24 VITALS — BP 136/67 | HR 88 | Temp 97.9°F | Resp 19 | Ht 68.0 in | Wt 231.1 lb

## 2021-01-24 VITALS — BP 136/73 | HR 73

## 2021-01-24 DIAGNOSIS — D45 Polycythemia vera: Secondary | ICD-10-CM

## 2021-01-24 DIAGNOSIS — D471 Chronic myeloproliferative disease: Secondary | ICD-10-CM

## 2021-01-24 LAB — CBC WITH DIFFERENTIAL (CANCER CENTER ONLY)
Abs Immature Granulocytes: 0.07 10*3/uL (ref 0.00–0.07)
Basophils Absolute: 0.1 10*3/uL (ref 0.0–0.1)
Basophils Relative: 1 %
Eosinophils Absolute: 0.2 10*3/uL (ref 0.0–0.5)
Eosinophils Relative: 2 %
HCT: 50 % — ABNORMAL HIGH (ref 36.0–46.0)
Hemoglobin: 15.1 g/dL — ABNORMAL HIGH (ref 12.0–15.0)
Immature Granulocytes: 1 %
Lymphocytes Relative: 11 %
Lymphs Abs: 1.2 10*3/uL (ref 0.7–4.0)
MCH: 26.1 pg (ref 26.0–34.0)
MCHC: 30.2 g/dL (ref 30.0–36.0)
MCV: 86.4 fL (ref 80.0–100.0)
Monocytes Absolute: 0.4 10*3/uL (ref 0.1–1.0)
Monocytes Relative: 4 %
Neutro Abs: 9.2 10*3/uL — ABNORMAL HIGH (ref 1.7–7.7)
Neutrophils Relative %: 81 %
Platelet Count: 209 10*3/uL (ref 150–400)
RBC: 5.79 MIL/uL — ABNORMAL HIGH (ref 3.87–5.11)
RDW: 19.8 % — ABNORMAL HIGH (ref 11.5–15.5)
WBC Count: 11.2 10*3/uL — ABNORMAL HIGH (ref 4.0–10.5)
nRBC: 0 % (ref 0.0–0.2)

## 2021-01-24 LAB — CMP (CANCER CENTER ONLY)
ALT: 8 U/L (ref 0–44)
AST: 10 U/L — ABNORMAL LOW (ref 15–41)
Albumin: 3.6 g/dL (ref 3.5–5.0)
Alkaline Phosphatase: 110 U/L (ref 38–126)
Anion gap: 11 (ref 5–15)
BUN: 17 mg/dL (ref 8–23)
CO2: 31 mmol/L (ref 22–32)
Calcium: 10.1 mg/dL (ref 8.9–10.3)
Chloride: 98 mmol/L (ref 98–111)
Creatinine: 0.78 mg/dL (ref 0.44–1.00)
GFR, Estimated: >60 mL/min (ref >60)
Glucose, Bld: 226 mg/dL — ABNORMAL HIGH (ref 70–99)
Potassium: 4 mmol/L (ref 3.5–5.1)
Sodium: 140 mmol/L (ref 135–145)
Total Bilirubin: 0.5 mg/dL (ref 0.3–1.2)
Total Protein: 7.6 g/dL (ref 6.5–8.1)

## 2021-01-24 LAB — LACTATE DEHYDROGENASE: LDH: 259 U/L — ABNORMAL HIGH (ref 98–192)

## 2021-01-24 MED ORDER — HYDROXYUREA 500 MG PO CAPS: 1000.0000 mg | ORAL_CAPSULE | Freq: Every day | ORAL | 3 refills | Status: DC

## 2021-01-24 MED ORDER — METHYLPREDNISOLONE SODIUM SUCC 125 MG IJ SOLR: 125.0000 mg | Freq: Once | INTRAMUSCULAR | Status: AC | PRN

## 2021-01-24 MED ORDER — TIXAGEVIMAB (PART OF EVUSHELD) INJECTION
300.0000 mg | Freq: Once | INTRAMUSCULAR | Status: DC
  Filled 2021-01-24: qty 3

## 2021-01-24 MED ORDER — DIPHENHYDRAMINE HCL 50 MG/ML IJ SOLN: 50.0000 mg | Freq: Once | INTRAMUSCULAR | Status: DC | PRN

## 2021-01-24 MED ORDER — CILGAVIMAB (PART OF EVUSHELD) INJECTION
300.0000 mg | Freq: Once | INTRAMUSCULAR | Status: DC
  Filled 2021-01-24: qty 3

## 2021-01-24 MED ORDER — ALBUTEROL SULFATE HFA 108 (90 BASE) MCG/ACT IN AERS: 2.0000 | INHALATION_SPRAY | Freq: Once | RESPIRATORY_TRACT | Status: AC | PRN

## 2021-01-24 MED ORDER — EPINEPHRINE 0.3 MG/0.3ML IJ SOAJ: 0.3000 mg | Freq: Once | INTRAMUSCULAR | Status: AC | PRN

## 2021-01-24 NOTE — Patient Instructions (Signed)

## 2021-01-24 NOTE — Progress Notes (Signed)
Phlebotomy given per MD order. A 16 gauge needle was placed in the Right AC at 1330 and 540 mls of blood was removed. Needle was removed at 1336 fully intact. Patient refused food/beverage and 30 min post observation. Discharged in stable condition. VSS.

## 2021-01-24 NOTE — Patient Instructions (Signed)
-   we shall continue therapeutic phlebotomy every month from now till HCT is stably <47 -Hydroxyurea reduced to 1000mg  po daily -RTC with Dr Irene Limbo in 3 months

## 2021-01-28 ENCOUNTER — Telehealth: Payer: Self-pay | Admitting: Hematology

## 2021-01-28 NOTE — Telephone Encounter (Signed)
Left message with follow-up appointments per 7/29 los.

## 2021-01-30 ENCOUNTER — Telehealth: Payer: Self-pay | Admitting: Nurse Practitioner

## 2021-01-30 ENCOUNTER — Telehealth: Payer: Self-pay | Admitting: Pharmacist

## 2021-01-30 ENCOUNTER — Encounter: Payer: Self-pay | Admitting: Hematology

## 2021-01-30 ENCOUNTER — Encounter: Payer: Self-pay | Admitting: Nurse Practitioner

## 2021-01-30 NOTE — Progress Notes (Addendum)
Chronic Care Management Pharmacy Assistant   Name: Katelyn Cline  MRN: [id number] DOB: [birth year]  Reason for Encounter: Medication Review/Medication Coordination Call  Medications: Outpatient Encounter Medications as of 01/30/2021  Medication Sig   amLODipine (NORVASC) 5 MG tablet TAKE 1 TABLET(5 MG) BY MOUTH DAILY   aspirin EC 81 MG tablet Take 81 mg by mouth daily.   atorvastatin (LIPITOR) 80 MG tablet TAKE 1 TABLET BY MOUTH DAILY AT 6 PM   Blood Glucose Monitoring Suppl (BLOOD GLUCOSE SYSTEM PAK) KIT Please dispense based on patient and insurance preference. Use as directed to monitor FSBS 2x daily. Dx: E11.9   buPROPion (WELLBUTRIN SR) 100 MG 12 hr tablet TAKE 1 TABLET(100 MG) BY MOUTH TWICE DAILY   DULoxetine (CYMBALTA) 60 MG capsule TAKE TWO CAPSULES BY MOUTH EVERY MORNING   fenofibrate (TRICOR) 145 MG tablet TAKE ONE TABLET BY MOUTH EVERYDAY AT BEDTIME   fish oil-omega-3 fatty acids 1000 MG capsule Take 2 g by mouth 2 (two) times daily.   gabapentin (NEURONTIN) 100 MG capsule Take 1-3 CAPSULES BY MOUTH THREE times daily AS NEEDED FOR nerve pain   Glucose Blood (BLOOD GLUCOSE TEST STRIPS) STRP Please dispense based on patient and insurance preference. Use as directed to monitor FSBS 2x daily. Dx: E11.9   hydrochlorothiazide (HYDRODIURIL) 25 MG tablet TAKE ONE TABLET BY MOUTH EVERY MORNING   hydroxyurea (HYDREA) 500 MG capsule Take 2 capsules (1,000 mg total) by mouth daily. Take with food to decrease appetite.   lisinopril (ZESTRIL) 5 MG tablet TAKE 1 TABLET (5 MG) BY MOUTH AT BEDTIME   metFORMIN (GLUCOPHAGE) 1000 MG tablet Take 1 tablet (1,000 mg total) by mouth 2 (two) times daily with a meal.   Microlet Lancets MISC Please dispense based on patient and insurance preference. Use as directed to monitor FSBS 2x daily. Dx: E11.9   nystatin (MYCOSTATIN/NYSTOP) powder Apply 1 application topically 3 (three) times daily. PA Case: [id number] Approved 06/30/2019- 06/28/2020   nystatin  cream (MYCOSTATIN) Apply 1 application topically 2 (two) times daily. To affected areas   oxyCODONE-acetaminophen (PERCOCET) 10-325 MG tablet Take 1 tablet by mouth 4 (four) times daily as needed for severe pain.   senna (SENOKOT) 8.6 MG TABS tablet Take 2 tablets by mouth daily as needed for mild constipation. (Patient not taking: Reported on 01/24/2021)   varenicline (CHANTIX STARTING MONTH PAK) 0.5 MG X 11 & 1 MG X 42 tablet Take one 0.5 mg tablet by mouth once daily for 3 days, then increase to one 0.5 mg tablet twice daily for 4 days, then increase to one 1 mg tablet twice daily.   Facility-Administered Encounter Medications as of 01/30/2021  Medication   cilgavimab (part of EVUSHELD) injection SOLN 300 mg   diphenhydrAMINE (BENADRYL) injection 50 mg   tixagevimab (part of EVUSHELD) injection SOLN 300 mg   Reviewed chart for medication changes ahead of medication coordination call.  No OVs, hospital visits since last care coordination call.  Consults Visit: 01/24/21 Oncology [name], MD. For Polycythemia Vanita Ingles. CHANGED Hydroxyurea 1000 mg daily ( 500 mg twice daily)  BP Readings from Last 3 Encounters:  01/24/21 136/73  01/24/21 136/67  12/25/20 (!) 137/56    Lab Results  Component Value Date   HGBA1C 7.1 (H) 06/18/2020     Patient obtains medications through Adherence Packaging  30 Days   Last adherence delivery included:  Hydroxyurea 500 mg 2 tablets at breakfast Amlodipine 5 mg 1 tablet at bedtime Duloxetine 60 mg  2 tablets at breakfast Fenofibrate 145 mg 1 tablet at bedtime Hctz 25 mg 1 tablet at bedtime Lisinopril 5 mg 1 tablet at bedtime [place] Sr 100 mg  tablet at breakfast and 1 tablet at bedtime Atorvastatin 80 mg 1 tablet at breakfast Metformin 1000 mg 1 tablet at breakfast and 2 tablets at bedtime. Gabapentin 100 mg 1-3 three times daily as needed  Patient declined meds last month: None.  Patient is due for next adherence delivery on:  02/13/21.  Called patient and reviewed medications and coordinated delivery.  This delivery to include: Hydroxyurea 500 mg 2 tablets at bedtime  Amlodipine 5 mg 1 tablet at bedtime Duloxetine 60 mg 2 tablets at breakfast Fenofibrate 145 mg 1 tablet at bedtime Hctz 25 mg 1 tablet at breakfast Lisinopril 5 mg 1 tablet at bedtime [place] Sr 100 mg  tablet at breakfast and 1 tablet at bedtime Atorvastatin 80 mg 1 tablet at bedtime Metformin 1000 mg 1 tablet at breakfast and 1 tablet at bedtime. Gabapentin 100 mg 1-3 three times daily as needed  Patient declined the following medications: None  Patient does not need refills at this time.   Confirmed delivery date of 02/13/21, advised patient that pharmacy will contact them the morning of delivery.  Follow-Up:Pharmacist Review   [name], RMA Clinical Pharmacist Assistant [phone]  10 minutes spent in review, coordination, and documentation.  Reviewed by: Beverly Milch, PharmD Clinical Pharmacist [phone]

## 2021-01-30 NOTE — Telephone Encounter (Signed)
Called patient to inform her care has been transferred to [facility]; provider wants to see patient for follow up appt. Left message on voicemail to request c/b.

## 2021-02-06 ENCOUNTER — Ambulatory Visit (INDEPENDENT_AMBULATORY_CARE_PROVIDER_SITE_OTHER): Payer: Medicare HMO | Admitting: Pharmacist

## 2021-02-06 DIAGNOSIS — E119 Type 2 diabetes mellitus without complications: Secondary | ICD-10-CM

## 2021-02-06 DIAGNOSIS — I10 Essential (primary) hypertension: Secondary | ICD-10-CM

## 2021-02-06 NOTE — Patient Instructions (Addendum)
Visit Information   Goals Addressed             This Visit's Progress    Manage My Medicine   On track    Timeframe:  Long-Range Goal Priority:  High Start Date:  10/18/20                           Expected End Date:  04/19/21                     Follow Up Date 01/26/21   - call for medicine refill 2 or 3 days before it runs out - learn to read medicine labels - use a pillbox to sort medicine - use an alarm clock or phone to remind me to take my medicine    Why is this important?   These steps will help you keep on track with your medicines.   Notes: Will set up with [facility] for packaging and delivery.       Patient Care Plan: General Pharmacy (Adult)     Problem Identified: Type II DM, HTN, HLD, Depression, Chronic Pain   Priority: High  Onset Date: 10/17/2020     Long-Range Goal: Patient-Specific Goal   Start Date: 10/17/2020  Expected End Date: 04/19/2021  Recent Progress: On track  Priority: High  Note:   Current Barriers:  Unable to independently monitor therapeutic efficacy Unable to achieve control of BP, Glucose  Unable to self administer medications as prescribed Does not adhere to prescribed medication regimen  Pharmacist Clinical Goal(s):  Patient will achieve adherence to monitoring guidelines and medication adherence to achieve therapeutic efficacy achieve control of BP and glucose as evidenced by home monitoring/A1c adhere to plan to optimize therapeutic regimen for BP/DM as evidenced by report of adherence to recommended medication management changes adhere to prescribed medication regimen as evidenced by fill date and pill pack usage through collaboration with PharmD and provider.    Interventions: 1:1 collaboration with Buelah Manis, Modena Nunnery, MD regarding development and update of comprehensive plan of care as evidenced by provider attestation and co-signature Inter-disciplinary care team collaboration (see longitudinal plan of  care) Comprehensive medication review performed; medication list updated in electronic medical record  Hypertension (BP goal <130/80) -Controlled -Current treatment: Amlodipine 5mg  daily HCTZ 25mg  daily Lisinopril 5mg  daily -Medications previously tried: Maxzide  -Current home readings: not available, patient not checking -Current dietary habits: she is in the process of improving her diet, she just got new teeth and is not able to eat mor fresh veggies, etc. As opposed to being restricted to softer foods -Current exercise habits: minmal -Denies hypotensive/hypertensive symptoms -Educated on BP goals and benefits of medications for prevention of heart attack, stroke and kidney damage; Daily salt intake goal < 2300 mg; Exercise goal of 150 minutes per week; Importance of home blood pressure monitoring; -Counseled to monitor BP at home a few times per week, document, and provide log at future appointments -Recommended to continue current medication Recommended monitor BP at home and contact providers if consistently >130/80.  Adherence is also an issue, patient has opted for pill packaging to help with this.  Update 02/06/21 Not checking BP at home. Denies any dizziness or HA's Adherence is improved with pill packs Still working on lifestyle mods  Continue current meds - BP has been fairly controlled in office.  Hyperlipidemia: (LDL goal < 100, TG < 150) -Not ideally controlled -Current treatment: Atorvastatin 80mg  Fenofibrate 145mg   daily Fish Oil 1000mg  2 tabs daily -Medications previously tried: none noted  -Current dietary patterns: see above -Current exercise habits: see above -Educated on Cholesterol goals;  Benefits of statin for ASCVD risk reduction; Importance of limiting foods high in cholesterol;  -Reviewed lipid panel, plans to continue positive diet changes -Recommend repeat lipid panel in June -Recommended to continue current medication  Diabetes (A1c goal  <7%) -Not ideally controlled -Current medications: Metformin 1000mg  BID -Medications previously tried: Carvel Getting -Current home glucose readings fasting glucose: 150s post prandial glucose: 180-200 -Denies hypoglycemic/hyperglycemic symptoms -Current meal patterns: working on improving diet, previously consumed high carbohydrate diet because these were the easiest to eat, now she has new teeth and plans to increase fresh vegetables and decrease her carbs.   -Current exercise: minimal -Educated on A1c and blood sugar goals; Benefits of weight loss; Prevention and management of hypoglycemic episodes; Benefits of routine self-monitoring of blood sugar; -Counseled to check feet daily and get yearly eye exams -Reports her last A1c she was going through a period of depression and did not take meds or care about her diet.  Has new positive outlook now. -Counseled on diet and exercise extensively Recommended to continue current medication  Update 02/06/21 Adherence is improved with pill packs!!  She is not really checking her sugar too often but reports around 150 when she does check. Denies any hypoglycemia episodes. Recommend recheck A1c ASAP.  Continue current meds  Depression/Anxiety (Goal: Minimize symptoms) -Controlled -Current treatment: Bupropion SR 100mg  q12h Duloxetine 60mg  2 capsules daily -Educated on Benefits of medication for symptom control  -Reports her mood is tremendously improved -Adherent with both medications -Recommended to continue current medication   Chronic Pain/Fibromyalgia (Goal: Improve QOL, decrease pain symptoms) -Controlled -Current treatment  Oxycodone 7.5-325mg  QID prn sever pain Gabapentin 100mg  1 to 3 capsules po TID -Medications previously tried: none noted -Reports pain is somewhat controlled, she does not like to take medications for it but has to -Only takes gabapentin if really needs it -Has pain contract for this medication  must get through [facility] -Recommended to continue current medication   Patient Goals/Self-Care Activities Patient will:  - take medications as prescribed check blood pressure daily, document, and provide at future appointments target a minimum of 150 minutes of moderate intensity exercise weekly engage in dietary modifications by decreasing carbs and increasing fresh vegetables  Follow Up Plan: The care management team will reach out to the patient again over the next 90 days.        Patient verbalizes understanding of instructions provided today and agrees to view in [place].  Telephone follow up appointment with pharmacy team member scheduled for: 4 months  [name], [place]

## 2021-02-06 NOTE — Progress Notes (Signed)
Chronic Care Management Pharmacy Note  02/06/2021 Name:  Katelyn Cline MRN:  [id number] DOB:  [birth year]  Subjective: Katelyn Cline is an 60 y.o. year old female who is a primary patient of Eulogio Bear, NP.  The CCM team was consulted for assistance with disease management and care coordination needs.    Engaged with patient face to face for initial visit in response to provider referral for pharmacy case management and/or care coordination services.   Consent to Services:  The patient was given the following information about Chronic Care Management services today, agreed to services, and gave verbal consent: 1. CCM service includes personalized support from designated clinical staff supervised by the primary care provider, including individualized plan of care and coordination with other care providers 2. 24/7 contact phone numbers for assistance for urgent and routine care needs. 3. Service will only be billed when office clinical staff spend 20 minutes or more in a month to coordinate care. 4. Only one practitioner may furnish and bill the service in a calendar month. 5.The patient may stop CCM services at any time (effective at the end of the month) by phone call to the office staff. 6. The patient will be responsible for cost sharing (co-pay) of up to 20% of the service fee (after annual deductible is met). Patient agreed to services and consent obtained.  Patient Care Team: Eulogio Bear, NP as PCP - General (Nurse Practitioner) [name], MD as Consulting Physician (Hematology) [name], [facility] as Pharmacist (Pharmacist)  Recent office visits: 06/18/20 [facility]) - AWV, unable to take Jardiance due to possible cause of polycythemia.  Referral to me for help with pill packing.  Recent consult visits: None recent  Hospital visits: None in previous 6 months  Objective:  Lab Results  Component Value Date   CREATININE 0.78 01/24/2021   BUN 17  01/24/2021   GFRNONAA >60 01/24/2021   GFRAA >60 01/12/2020   NA 140 01/24/2021   K 4.0 01/24/2021   CALCIUM 10.1 01/24/2021   CO2 31 01/24/2021   GLUCOSE 226 (H) 01/24/2021    Lab Results  Component Value Date/Time   HGBA1C 7.1 (H) 06/18/2020 11:22 AM   HGBA1C 6.0 (H) 10/16/2019 12:06 PM   MICROALBUR 9.3 06/18/2020 10:49 AM   MICROALBUR 2.6 10/07/2016 09:22 AM    Last diabetic Eye exam:  Lab Results  Component Value Date/Time   HMDIABEYEEXA No Retinopathy 09/06/2017 12:00 AM    Last diabetic Foot exam: No results found for: HMDIABFOOTEX   Lab Results  Component Value Date   CHOL 164 06/18/2020   HDL 39 (L) 06/18/2020   LDLCALC 98 06/18/2020   LDLDIRECT 60 06/16/2019   TRIG 171 (H) 06/18/2020   CHOLHDL 4.2 06/18/2020    Hepatic Function Latest Ref Rng & Units 01/24/2021 12/25/2020 11/22/2020  Total Protein 6.5 - 8.1 g/dL 7.6 7.1 7.2  Albumin 3.5 - 5.0 g/dL 3.6 3.3(L) 3.6  AST 15 - 41 U/L 10(L) 11(L) 14(L)  ALT 0 - 44 U/L $Remo'8 11 12  'uQhQM$ Alk Phosphatase 38 - 126 U/L 110 126 131(H)  Total Bilirubin 0.3 - 1.2 mg/dL 0.5 0.3 0.3    Lab Results  Component Value Date/Time   TSH 1.98 09/17/2015 08:52 AM   TSH 4.922 (H) 05/28/2015 08:58 AM   FREET4 1.2 09/17/2015 08:52 AM   FREET4 1.00 05/28/2015 08:58 AM    CBC Latest Ref Rng & Units 01/24/2021 12/25/2020 11/22/2020  WBC 4.0 - 10.5 K/uL  11.2(H) 14.7(H) 17.5(H)  Hemoglobin 12.0 - 15.0 g/dL 15.1(H) 14.2 14.8  Hematocrit 36.0 - 46.0 % 50.0(H) 47.9(H) 49.3(H)  Platelets 150 - 400 K/uL 209 453(H) 401(H)    Lab Results  Component Value Date/Time   VD25OH 28 (L) 12/02/2012 10:33 AM   VD25OH 15 (L) 10/05/2012 10:04 AM    Clinical ASCVD: Yes  The 10-year ASCVD risk score Mikey Bussing DC Jr., et al., 2013) is: 23.2%   Values used to calculate the score:     Age: 89 years     Sex: Female     Is Non-Hispanic African American: No     Diabetic: Yes     Tobacco smoker: Yes     Systolic Blood Pressure: 202 mmHg     Is BP treated: Yes      HDL Cholesterol: 39 mg/dL     Total Cholesterol: 164 mg/dL    Depression screen [facility] 2/9 06/18/2020 10/16/2019 07/18/2018  Decreased Interest 2 2 2   Down, Depressed, Hopeless 2 2 2   PHQ - 2 Score 4 4 4   Altered sleeping 1 2 2   Tired, decreased energy 1 2 2   Change in appetite 0 0 1  Feeling bad or failure about yourself  1 1 1   Trouble concentrating 1 2 1   Moving slowly or fidgety/restless 0 0 0  Suicidal thoughts 0 0 0  PHQ-9 Score 8 11 11   Difficult doing work/chores Somewhat difficult Somewhat difficult Very difficult  Some recent data might be hidden       Social History   Tobacco Use  Smoking Status Every Day   Packs/day: 1.00   Years: 50.00   Pack years: 50.00   Types: Cigarettes  Smokeless Tobacco Never   BP Readings from Last 3 Encounters:  01/24/21 136/73  01/24/21 136/67  12/25/20 (!) 137/56   Pulse Readings from Last 3 Encounters:  01/24/21 73  01/24/21 88  12/25/20 68   Wt Readings from Last 3 Encounters:  01/24/21 231 lb 1.6 oz (104.8 kg)  10/25/20 236 lb 4.8 oz (107.2 kg)  06/18/20 235 lb (106.6 kg)   BMI Readings from Last 3 Encounters:  01/24/21 35.14 kg/m  10/25/20 35.93 kg/m  06/18/20 35.73 kg/m    Assessment/Interventions: Review of patient past medical history, allergies, medications, health status, including review of consultants reports, laboratory and other test data, was performed as part of comprehensive evaluation and provision of chronic care management services.   SDOH:  (Social Determinants of Health) assessments and interventions performed: Yes   Financial Resource Strain: Not on file    SDOH Screenings   Alcohol Screen: Low Risk    Last Alcohol Screening Score (AUDIT): 0  Depression (PHQ2-9): Medium Risk   PHQ-2 Score: 8  Financial Resource Strain: Not on file  Food Insecurity: Not on file  Housing: Not on file  Physical Activity: Not on file  Social Connections: Not on file  Stress: Not on file  Tobacco Use: High  Risk   Smoking Tobacco Use: Every Day   Smokeless Tobacco Use: Never  Transportation Needs: Not on file    CCM Care Plan  Allergies  Allergen Reactions   Contrast Media [Iodinated Diagnostic Agents] Anaphylaxis   Erythromycin     Stomach pain   Flagyl [Metronidazole] Other (See Comments)    Generalized pain.   Latex Other (See Comments)    Was told to be careful because of Dye allergy   Other     Iodine Dye   Penicillins Other (  See Comments)    Patient was an infant, no idea of reaction. Tolerates Keflex. Did it involve swelling of the face/tongue/throat, SOB, or low BP? Unknown Did it involve sudden or severe rash/hives, skin peeling, or any reaction on the inside of your mouth or nose? Unknown Did you need to seek medical attention at a hospital or doctor's office? Unknown When did it last happen?      infant If all above answers are "NO", may proceed with cephalosporin use.    Tetracyclines & Related Other (See Comments)    GI side effects    Medications Reviewed Today     Reviewed by [name], [place] (Pharmacist) on 02/06/21 at 1202  Med List Status: <None>   Medication Order Taking? Sig Documenting Provider Last Dose Status Informant  amLODipine (NORVASC) 5 MG tablet [id number] Yes TAKE 1 TABLET(5 MG) BY MOUTH DAILY Susy Frizzle, MD Taking Active   aspirin EC 81 MG tablet [id number] Yes Take 81 mg by mouth daily. [name], MD Taking Active Self  atorvastatin (LIPITOR) 80 MG tablet [id number] Yes TAKE 1 TABLET BY MOUTH DAILY AT 6 PM Susy Frizzle, MD Taking Active   Blood Glucose Monitoring Suppl (BLOOD GLUCOSE SYSTEM PAK) KIT [id number] Yes Please dispense based on patient and insurance preference. Use as directed to monitor FSBS 2x daily. Dx: E11.9 [name], MD Taking Active   buPROPion [facility] SR) 100 MG 12 hr tablet [id number] Yes TAKE 1 TABLET(100 MG) BY MOUTH TWICE DAILY Susy Frizzle, MD Taking Active   DULoxetine (CYMBALTA)  60 MG capsule [id number] Yes TAKE TWO CAPSULES BY MOUTH EVERY MORNING Susy Frizzle, MD Taking Active   fenofibrate (TRICOR) 145 MG tablet [id number] Yes TAKE ONE TABLET BY MOUTH EVERYDAY AT BEDTIME Susy Frizzle, MD Taking Active   fish oil-omega-3 fatty acids 1000 MG capsule [id number] Yes Take 2 g by mouth 2 (two) times daily. [provider] Taking Active Self  gabapentin (NEURONTIN) 100 MG capsule [id number] Yes Take 1-3 CAPSULES BY MOUTH THREE times daily AS NEEDED FOR nerve pain Susy Frizzle, MD Taking Active   Glucose Blood (BLOOD GLUCOSE TEST STRIPS) STRP [id number] Yes Please dispense based on patient and insurance preference. Use as directed to monitor FSBS 2x daily. Dx: E11.9 [name], MD Taking Active   hydrochlorothiazide (HYDRODIURIL) 25 MG tablet [id number] Yes TAKE ONE TABLET BY MOUTH EVERY MORNING Susy Frizzle, MD Taking Active   hydroxyurea (HYDREA) 500 MG capsule [id number] Yes Take 2 capsules (1,000 mg total) by mouth daily. Take with food to decrease appetite. [name], MD Taking Active   lisinopril (ZESTRIL) 5 MG tablet [id number] Yes TAKE 1 TABLET (5 MG) BY MOUTH AT BEDTIME Susy Frizzle, MD Taking Active   metFORMIN (GLUCOPHAGE) 1000 MG tablet [id number] Yes Take 1 tablet (1,000 mg total) by mouth 2 (two) times daily with a meal. Susy Frizzle, MD Taking Active   Microlet Lancets [place] [id number] Yes Please dispense based on patient and insurance preference. Use as directed to monitor FSBS 2x daily. Dx: E11.9 [name], MD Taking Active   nystatin (MYCOSTATIN/NYSTOP) powder [id number] Yes Apply 1 application topically 3 (three) times daily. PA Case: [id number] Approved 06/30/2019- 06/28/2020 [name], MD Taking Active   nystatin cream Royden Purl) [id number] Yes Apply 1 application topically 2 (two) times daily. To affected areas [name], MD Taking Active   oxyCODONE-acetaminophen (PERCOCET) 10-325 MG tablet  [id number] Yes Take 1  tablet by mouth 4 (four) times daily as needed for severe pain. [place], Modena Nunnery, MD Taking Active Self  senna (SENOKOT) 8.6 MG TABS tablet [id number] Yes Take 2 tablets by mouth daily as needed for mild constipation. [provider] Taking Active   varenicline (CHANTIX STARTING MONTH PAK) 0.5 MG X 11 & 1 MG X 42 tablet [id number] Yes Take one 0.5 mg tablet by mouth once daily for 3 days, then increase to one 0.5 mg tablet twice daily for 4 days, then increase to one 1 mg tablet twice daily. Susy Frizzle, MD Taking Active             Patient Active Problem List   Diagnosis Date Noted   MPN (myeloproliferative neoplasm) ([place])    Leukocytosis 03/12/2018   Symptoms of upper respiratory infection (URI) 03/12/2018   Dental caries 03/12/2018   Aortic atherosclerosis ([place]) 02/21/2018   Constipation 05/28/2015   OE (otitis externa) 01/04/2015   Seborrheic keratoses 08/24/2014   PVC (premature ventricular contraction) 05/18/2014   Diabetes mellitus type II, controlled ([place]) 02/06/2013   Chronic hypoxemic respiratory failure ([place]) 02/06/2013   History of basal cell carcinoma 12/01/2012   Posttraumatic stress disorder 12/01/2012   History of malignant melanoma 10/18/2012   Polycythemia vera ([place]) 08/05/2012   Hyperlipidemia 04/09/2012   Narcolepsy 04/09/2012   Peripheral neuropathy 04/09/2012   Tobacco use disorder 04/09/2012   Obesity 04/09/2012   Essential hypertension, benign 04/09/2012   DDD (degenerative disc disease), lumbar 02/16/2012   DDD (degenerative disc disease), cervical 02/16/2012   Chronic pain disorder 02/16/2012   Depression 02/16/2012   Fibromyalgia 02/16/2012    Immunization History  Administered Date(s) Administered   Hepatitis B, ped/adol 06/29/2001   Influenza,inj,Quad PF,6+ Mos 04/07/2013, 05/18/2014, 05/28/2015, 05/13/2016, 06/11/2017, 07/18/2018, 06/16/2019, 06/18/2020   Moderna Sars-Covid-2 Vaccination 11/20/2019,  12/19/2019   Pneumococcal Polysaccharide-23 12/10/2011, 10/25/2013   Td 06/29/2001   Tdap 12/10/2011    Conditions to be addressed/monitored: Type II DM HTN, PVC, HLD, Depression, Chronic Pain,   Care Plan : General Pharmacy (Adult)  Updates made by [name], RPH since 02/06/2021 12:00 AM     Problem: Type II DM, HTN, HLD, Depression, Chronic Pain   Priority: High  Onset Date: 10/17/2020     Long-Range Goal: Patient-Specific Goal   Start Date: 10/17/2020  Expected End Date: 04/19/2021  Recent Progress: On track  Priority: High  Note:   Current Barriers:  Unable to independently monitor therapeutic efficacy Unable to achieve control of BP, Glucose  Unable to self administer medications as prescribed Does not adhere to prescribed medication regimen  Pharmacist Clinical Goal(s):  Patient will achieve adherence to monitoring guidelines and medication adherence to achieve therapeutic efficacy achieve control of BP and glucose as evidenced by home monitoring/A1c adhere to plan to optimize therapeutic regimen for BP/DM as evidenced by report of adherence to recommended medication management changes adhere to prescribed medication regimen as evidenced by fill date and pill pack usage through collaboration with PharmD and provider.    Interventions: 1:1 collaboration with Buelah Manis, Modena Nunnery, MD regarding development and update of comprehensive plan of care as evidenced by provider attestation and co-signature Inter-disciplinary care team collaboration (see longitudinal plan of care) Comprehensive medication review performed; medication list updated in electronic medical record  Hypertension (BP goal <130/80) -Controlled -Current treatment: Amlodipine 5mg  daily HCTZ 25mg  daily Lisinopril 5mg  daily -Medications previously tried: Maxzide  -Current home readings: not available, patient not checking -Current dietary habits: she is in  the process of improving her diet, she  just got new teeth and is not able to eat mor fresh veggies, etc. As opposed to being restricted to softer foods -Current exercise habits: minmal -Denies hypotensive/hypertensive symptoms -Educated on BP goals and benefits of medications for prevention of heart attack, stroke and kidney damage; Daily salt intake goal < 2300 mg; Exercise goal of 150 minutes per week; Importance of home blood pressure monitoring; -Counseled to monitor BP at home a few times per week, document, and provide log at future appointments -Recommended to continue current medication Recommended monitor BP at home and contact providers if consistently >130/80.  Adherence is also an issue, patient has opted for pill packaging to help with this.  Update 02/06/21 Not checking BP at home. Denies any dizziness or HA's Adherence is improved with pill packs Still working on lifestyle mods  Continue current meds - BP has been fairly controlled in office.  Hyperlipidemia: (LDL goal < 100, TG < 150) -Not ideally controlled -Current treatment: Atorvastatin 80mg  Fenofibrate 145mg  daily Fish Oil 1000mg  2 tabs daily -Medications previously tried: none noted  -Current dietary patterns: see above -Current exercise habits: see above -Educated on Cholesterol goals;  Benefits of statin for ASCVD risk reduction; Importance of limiting foods high in cholesterol;  -Reviewed lipid panel, plans to continue positive diet changes -Recommend repeat lipid panel in June -Recommended to continue current medication  Diabetes (A1c goal <7%) -Not ideally controlled -Current medications: Metformin 1000mg  BID -Medications previously tried: [place] Getting -Current home glucose readings fasting glucose: 150s post prandial glucose: 180-200 -Denies hypoglycemic/hyperglycemic symptoms -Current meal patterns: working on improving diet, previously consumed high carbohydrate diet because these were the easiest to eat, now she has new  teeth and plans to increase fresh vegetables and decrease her carbs.   -Current exercise: minimal -Educated on A1c and blood sugar goals; Benefits of weight loss; Prevention and management of hypoglycemic episodes; Benefits of routine self-monitoring of blood sugar; -Counseled to check feet daily and get yearly eye exams -Reports her last A1c she was going through a period of depression and did not take meds or care about her diet.  Has new positive outlook now. -Counseled on diet and exercise extensively Recommended to continue current medication  Update 02/06/21 Adherence is improved with pill packs!!  She is not really checking her sugar too often but reports around 150 when she does check. Denies any hypoglycemia episodes. Recommend recheck A1c ASAP.  Continue current meds  Depression/Anxiety (Goal: Minimize symptoms) -Controlled -Current treatment: Bupropion SR 100mg  q12h Duloxetine 60mg  2 capsules daily -Educated on Benefits of medication for symptom control  -Reports her mood is tremendously improved -Adherent with both medications -Recommended to continue current medication   Chronic Pain/Fibromyalgia (Goal: Improve QOL, decrease pain symptoms) -Controlled -Current treatment  Oxycodone 7.5-325mg  QID prn sever pain Gabapentin 100mg  1 to 3 capsules po TID -Medications previously tried: none noted -Reports pain is somewhat controlled, she does not like to take medications for it but has to -Only takes gabapentin if really needs it -Has pain contract for this medication must get through [facility] -Recommended to continue current medication   Patient Goals/Self-Care Activities Patient will:  - take medications as prescribed check blood pressure daily, document, and provide at future appointments target a minimum of 150 minutes of moderate intensity exercise weekly engage in dietary modifications by decreasing carbs and increasing fresh vegetables  Follow Up Plan:  The care management team will reach out to the patient again over  the next 90 days.         Medication Assistance: None required.  Patient affirms current coverage meets needs.  Patient's preferred pharmacy is:  [facility] [place], [place] - [address] [place] [address] [place] [address] Phone: [phone] Fax: [phone]  [facility] - [name], [place] - [address] [place] [address] Phone: [phone] Fax: [phone]  Uses pill box? No - patient does not have organization method Pt endorses 85% compliance  We discussed: Benefits of medication synchronization, packaging and delivery as well as enhanced pharmacist oversight with [facility]. Patient decided to: Utilize [facility] for medication synchronization, packaging and delivery  Verbal consent obtained for [facility] enhanced pharmacy services (medication synchronization, adherence packaging, delivery coordination). A medication sync plan was created to allow patient to get all medications delivered once every 30 to 90 days per patient preference. Patient understands they have freedom to choose pharmacy and clinical pharmacist will coordinate care between all prescribers and [facility].   Care Plan and Follow Up Patient Decision:  Patient agrees to Care Plan and Follow-up.  Plan: The care management team will reach out to the patient again over the next 90 days.  Beverly Milch, PharmD Clinical Pharmacist [place] [phone]

## 2021-02-09 NOTE — Progress Notes (Deleted)
Subjective:    Patient ID: Katelyn Cline, female    DOB: 02-11-1961, 59 y.o.   MRN: [id number]  HPI: Katelyn Cline is a 60 y.o. female presenting for  No chief complaint on file.  HYPERTENSION / HYPERLIPIDEMIA Satisfied with current treatment? {Blank single:19197::"yes","no"} Duration of hypertension: {Blank single:19197::"chronic","months","years"} BP monitoring frequency: {Blank single:19197::"not checking","rarely","daily","weekly","monthly","a few times a day","a few times a week","a few times a month"} BP range:  BP medication side effects: {Blank single:19197::"yes","no"} Past BP meds: Duration of hyperlipidemia: {Blank single:19197::"chronic","months","years"} Cholesterol medication side effects: {Blank single:19197::"yes","no"} Cholesterol supplements:  Past cholesterol medications:  Aspirin: {Blank single:19197::"yes","no"} Recent stressors: {Blank single:19197::"yes","no"} Recurrent headaches: {Blank single:19197::"yes","no"} Visual changes: {Blank single:19197::"yes","no"} Palpitations: {Blank single:19197::"yes","no"} Dyspnea: {Blank single:19197::"yes","no"} Chest pain: {Blank single:19197::"yes","no"} Lower extremity edema: {Blank single:19197::"yes","no"} Dizzy/lightheaded: {Blank single:19197::"yes","no"}  DIABETES Hypoglycemic episodes:{Blank single:19197::"yes","no"} Polydipsia/polyuria: {Blank single:19197::"yes","no"} Visual disturbance: {Blank single:19197::"yes","no"} Chest pain: {Blank single:19197::"yes","no"} Paresthesias: {Blank single:19197::"yes","no"} Glucose Monitoring: {Blank single:19197::"yes","no"}  Accucheck frequency:  Fasting glucose:  Post prandial:  Evening:  Before meals: Taking Insulin?: {Blank single:19197::"yes","no"}  Long acting insulin:  Short acting insulin: Blood Pressure Monitoring: {Blank single:19197::"not checking","rarely","daily","weekly","monthly","a few times a day","a few times a week","a few times a  month"} Normal BP: Retinal Examination: {Blank single:19197::"Up to Date","Not up to Date"} Foot Exam: {Blank single:19197::"Up to Date","Not up to Date"} Diabetic Education: {Blank single:19197::"Completed","Not Completed"} Pneumovax: {Blank single:19197::"Up to Date","Not up to Date","unknown"} Influenza: {Blank single:19197::"Up to Date","Not up to Date","unknown"} Aspirin: {Blank single:19197::"yes","no"}  FIBROMYALGIA Pain status: {Blank single:19197::"controlled","uncontrolled","better","worse","exacerbated","stable"} Satisfied with current treatment?: {Blank single:19197::"yes","no"} Medication side effects: {Blank single:19197::"yes","no"} Medication compliance: {Blank single:19197::"excellent compliance","good compliance","fair compliance","poor compliance"} Duration:  Location:  Quality: Current pain level: {Blank single:19197::"mild","moderate","severe","1/10","2/10","3/10","4/10","5/10","6/10","7/10","8/10","9/10","10/10"} Previous pain level: {Blank single:19197::"mild","moderate","severe","1/10","2/10","3/10","4/10","5/10","6/10","7/10","8/10","9/10","10/10"} Aggravating factors: Alleviating factors:  Previous pain specialty evaluation: {Blank single:19197::"yes","no"} Non-narcotic analgesic meds: {Blank single:19197::"yes","no"} Narcotic contract:{Blank single:19197::"yes","no"} Treatments attempted:   Allergies  Allergen Reactions   Contrast Media [Iodinated Diagnostic Agents] Anaphylaxis   Erythromycin     Stomach pain   Flagyl [Metronidazole] Other (See Comments)    Generalized pain.   Latex Other (See Comments)    Was told to be careful because of Dye allergy   Other     Iodine Dye   Penicillins Other (See Comments)    Patient was an infant, no idea of reaction. Tolerates Keflex. Did it involve swelling of the face/tongue/throat, SOB, or low BP? Unknown Did it involve sudden or severe rash/hives, skin peeling, or any reaction on the inside of your mouth or  nose? Unknown Did you need to seek medical attention at a hospital or doctor's office? Unknown When did it last happen?      infant If all above answers are "NO", may proceed with cephalosporin use.    Tetracyclines & Related Other (See Comments)    GI side effects    Outpatient Encounter Medications as of 02/10/2021  Medication Sig   amLODipine (NORVASC) 5 MG tablet TAKE 1 TABLET(5 MG) BY MOUTH DAILY   aspirin EC 81 MG tablet Take 81 mg by mouth daily.   atorvastatin (LIPITOR) 80 MG tablet TAKE 1 TABLET BY MOUTH DAILY AT 6 PM   Blood Glucose Monitoring Suppl (BLOOD GLUCOSE SYSTEM PAK) KIT Please dispense based on patient and insurance preference. Use as directed to monitor FSBS 2x daily. Dx: E11.9   buPROPion (WELLBUTRIN SR) 100 MG 12 hr tablet TAKE 1 TABLET(100 MG) BY MOUTH TWICE DAILY   DULoxetine (CYMBALTA) 60 MG capsule TAKE TWO CAPSULES BY MOUTH EVERY MORNING   fenofibrate (TRICOR) 145 MG tablet TAKE  ONE TABLET BY MOUTH EVERYDAY AT BEDTIME   fish oil-omega-3 fatty acids 1000 MG capsule Take 2 g by mouth 2 (two) times daily.   gabapentin (NEURONTIN) 100 MG capsule Take 1-3 CAPSULES BY MOUTH THREE times daily AS NEEDED FOR nerve pain   Glucose Blood (BLOOD GLUCOSE TEST STRIPS) STRP Please dispense based on patient and insurance preference. Use as directed to monitor FSBS 2x daily. Dx: E11.9   hydrochlorothiazide (HYDRODIURIL) 25 MG tablet TAKE ONE TABLET BY MOUTH EVERY MORNING   hydroxyurea (HYDREA) 500 MG capsule Take 2 capsules (1,000 mg total) by mouth daily. Take with food to decrease appetite.   lisinopril (ZESTRIL) 5 MG tablet TAKE 1 TABLET (5 MG) BY MOUTH AT BEDTIME   metFORMIN (GLUCOPHAGE) 1000 MG tablet Take 1 tablet (1,000 mg total) by mouth 2 (two) times daily with a meal.   Microlet Lancets MISC Please dispense based on patient and insurance preference. Use as directed to monitor FSBS 2x daily. Dx: E11.9   nystatin (MYCOSTATIN/NYSTOP) powder Apply 1 application topically 3  (three) times daily. PA Case: [id number] Approved 06/30/2019- 06/28/2020   nystatin cream (MYCOSTATIN) Apply 1 application topically 2 (two) times daily. To affected areas   oxyCODONE-acetaminophen (PERCOCET) 10-325 MG tablet Take 1 tablet by mouth 4 (four) times daily as needed for severe pain.   senna (SENOKOT) 8.6 MG TABS tablet Take 2 tablets by mouth daily as needed for mild constipation.   varenicline (CHANTIX STARTING MONTH PAK) 0.5 MG X 11 & 1 MG X 42 tablet Take one 0.5 mg tablet by mouth once daily for 3 days, then increase to one 0.5 mg tablet twice daily for 4 days, then increase to one 1 mg tablet twice daily.   No facility-administered encounter medications on file as of 02/10/2021.    Patient Active Problem List   Diagnosis Date Noted   MPN (myeloproliferative neoplasm) ([place])    Leukocytosis 03/12/2018   Symptoms of upper respiratory infection (URI) 03/12/2018   Dental caries 03/12/2018   Aortic atherosclerosis ([place]) 02/21/2018   Constipation 05/28/2015   OE (otitis externa) 01/04/2015   Seborrheic keratoses 08/24/2014   PVC (premature ventricular contraction) 05/18/2014   Diabetes mellitus type II, controlled ([place]) 02/06/2013   Chronic hypoxemic respiratory failure ([place]) 02/06/2013   History of basal cell carcinoma 12/01/2012   Posttraumatic stress disorder 12/01/2012   History of malignant melanoma 10/18/2012   Polycythemia vera ([place]) 08/05/2012   Hyperlipidemia 04/09/2012   Narcolepsy 04/09/2012   Peripheral neuropathy 04/09/2012   Tobacco use disorder 04/09/2012   Obesity 04/09/2012   Essential hypertension, benign 04/09/2012   DDD (degenerative disc disease), lumbar 02/16/2012   DDD (degenerative disc disease), cervical 02/16/2012   Chronic pain disorder 02/16/2012   Depression 02/16/2012   Fibromyalgia 02/16/2012    Past Medical History:  Diagnosis Date   Agoraphobia    Arthritis    "all joints"   Chronic low back pain    Chronic respiratory failure with  hypoxia ([name])    4L  via [place],  followed by pcp,   (09-16-2018  per pt only uses while at home and at night ,  portable oxygen is not working)   COPD (chronic obstructive pulmonary disease) ([place])    DDD (degenerative disc disease), lumbosacral    Dental caries    DM type 2 (diabetes mellitus, type 2) ([place])    followed by pcp   Fibromyalgia    GERD (gastroesophageal reflux disease)    occasional , no meds  History of basal cell carcinoma (BCC) excision    s/p  Moh's of face/ nose 12/ 2013   History of palpitations    per pt found to have occaional PVCs   History of UTI    HTN (hypertension)    Hyperlipidemia    Loose, teeth    Major depression    Metastatic melanoma ([place]) 10/18/2012   12 mm posterior right upper lobe pulmonary nodule, max SUV 3.0  s/p  right wedge resection 10-21-2012,  followed by oncologist-- dr Irene Limbo,  no recurrence   Mixed stress and urge urinary incontinence    Myeloproliferative neoplasm ([place])    Narcolepsy    OSA (obstructive sleep apnea)    Oxygen dependent    4 L via [place],  per pt portable oxygen not working but does use while at home and at night   Panic attacks    Periodontitis    Peripheral neuropathy    feet   Polycythemia vera(238.4) hematology/ oncology-- dr Irene Limbo ([facility])   first dx 02014 due to smoker/copd--  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 Interim medical history since our last visit reviewed.  Review of Systems Per HPI unless specifically indicated above     Objective:    There were no vitals taken for this visit.  Wt Readings from Last 3 Encounters:  01/24/21 231 lb 1.6 oz (104.8 kg)  10/25/20 236 lb 4.8 oz (107.2 kg)   06/18/20 235 lb (106.6 kg)    Physical Exam  Results for orders placed or performed in visit on 01/24/21  Lactate dehydrogenase (LDH)  Result Value Ref Range   LDH 259 (H) 98 - 192 U/L  CMP ([facility] only)  Result Value Ref Range   Sodium 140 135 - 145 mmol/L   Potassium 4.0 3.5 - 5.1 mmol/L   Chloride 98 98 - 111 mmol/L   CO2 31 22 - 32 mmol/L   Glucose, Bld 226 (H) 70 - 99 mg/dL   BUN 17 8 - 23 mg/dL   Creatinine 0.78 0.44 - 1.00 mg/dL   Calcium 10.1 8.9 - 10.3 mg/dL   Total Protein 7.6 6.5 - 8.1 g/dL   Albumin 3.6 3.5 - 5.0 g/dL   AST 10 (L) 15 - 41 U/L   ALT 8 0 - 44 U/L   Alkaline Phosphatase 110 38 - 126 U/L   Total Bilirubin 0.5 0.3 - 1.2 mg/dL   GFR, Estimated >60 >60 mL/min   Anion gap 11 5 - 15  CBC with Differential (Cancer Center Only)  Result Value Ref Range   WBC Count 11.2 (H) 4.0 - 10.5 K/uL   RBC 5.79 (H) 3.87 - 5.11 MIL/uL   Hemoglobin 15.1 (H) 12.0 - 15.0 g/dL   HCT 50.0 (H) 36.0 - 46.0 %   MCV 86.4 80.0 - 100.0 fL   MCH 26.1 26.0 - 34.0 pg   MCHC 30.2 30.0 - 36.0 g/dL   RDW 19.8 (H) 11.5 - 15.5 %   Platelet Count 209 150 - 400 K/uL   nRBC 0.0 0.0 - 0.2 %   Neutrophils Relative % 81 %   Neutro Abs 9.2 (H) 1.7 -  7.7 K/uL   Lymphocytes Relative 11 %   Lymphs Abs 1.2 0.7 - 4.0 K/uL   Monocytes Relative 4 %   Monocytes Absolute 0.4 0.1 - 1.0 K/uL   Eosinophils Relative 2 %   Eosinophils Absolute 0.2 0.0 - 0.5 K/uL   Basophils Relative 1 %   Basophils Absolute 0.1 0.0 - 0.1 K/uL   Immature Granulocytes 1 %   Abs Immature Granulocytes 0.07 0.00 - 0.07 K/uL      Assessment & Plan:   Problem List Items Addressed This Visit   None    Follow up plan: No follow-ups on file.

## 2021-02-10 ENCOUNTER — Ambulatory Visit: Payer: Medicare HMO | Admitting: Nurse Practitioner

## 2021-02-11 NOTE — Telephone Encounter (Signed)
Patient's care transferred to NP.

## 2021-02-18 ENCOUNTER — Ambulatory Visit: Payer: Medicare HMO | Attending: Nurse Practitioner

## 2021-02-18 ENCOUNTER — Other Ambulatory Visit: Payer: Self-pay

## 2021-02-18 DIAGNOSIS — R2689 Other abnormalities of gait and mobility: Secondary | ICD-10-CM

## 2021-02-18 DIAGNOSIS — R29898 Other symptoms and signs involving the musculoskeletal system: Secondary | ICD-10-CM | POA: Diagnosis present

## 2021-02-18 DIAGNOSIS — M5136 Other intervertebral disc degeneration, lumbar region: Secondary | ICD-10-CM | POA: Diagnosis present

## 2021-02-18 DIAGNOSIS — M542 Cervicalgia: Secondary | ICD-10-CM

## 2021-02-19 ENCOUNTER — Ambulatory Visit (INDEPENDENT_AMBULATORY_CARE_PROVIDER_SITE_OTHER): Payer: Medicare HMO | Admitting: Nurse Practitioner

## 2021-02-19 ENCOUNTER — Encounter: Payer: Self-pay | Admitting: Nurse Practitioner

## 2021-02-19 VITALS — BP 130/88 | HR 78 | Temp 98.5°F | Ht 68.0 in | Wt 227.6 lb

## 2021-02-19 DIAGNOSIS — G6289 Other specified polyneuropathies: Secondary | ICD-10-CM

## 2021-02-19 DIAGNOSIS — M797 Fibromyalgia: Secondary | ICD-10-CM

## 2021-02-19 DIAGNOSIS — Z1211 Encounter for screening for malignant neoplasm of colon: Secondary | ICD-10-CM

## 2021-02-19 DIAGNOSIS — D45 Polycythemia vera: Secondary | ICD-10-CM

## 2021-02-19 DIAGNOSIS — I700 Atherosclerosis of aorta: Secondary | ICD-10-CM | POA: Diagnosis not present

## 2021-02-19 DIAGNOSIS — F32A Depression, unspecified: Secondary | ICD-10-CM

## 2021-02-19 DIAGNOSIS — G894 Chronic pain syndrome: Secondary | ICD-10-CM

## 2021-02-19 DIAGNOSIS — R011 Cardiac murmur, unspecified: Secondary | ICD-10-CM

## 2021-02-19 DIAGNOSIS — E782 Mixed hyperlipidemia: Secondary | ICD-10-CM

## 2021-02-19 DIAGNOSIS — J9611 Chronic respiratory failure with hypoxia: Secondary | ICD-10-CM

## 2021-02-19 DIAGNOSIS — E1142 Type 2 diabetes mellitus with diabetic polyneuropathy: Secondary | ICD-10-CM | POA: Diagnosis not present

## 2021-02-19 DIAGNOSIS — I10 Essential (primary) hypertension: Secondary | ICD-10-CM

## 2021-02-19 DIAGNOSIS — F17200 Nicotine dependence, unspecified, uncomplicated: Secondary | ICD-10-CM

## 2021-02-19 NOTE — Patient Instructions (Signed)
https://www.diabeteseducator.org/docs/default-source/living-with-diabetes/conquering-the-grocery-store-v1.pdf?sfvrsn=4">  Carbohydrate Counting for Diabetes Mellitus, Adult Carbohydrate counting is a method of keeping track of how many carbohydrates you eat. Eating carbohydrates naturally increases the amount of sugar (glucose) in the blood. Counting how many carbohydrates you eat improves your bloodglucose control, which helps you manage your diabetes. It is important to know how many carbohydrates you can safely have in each meal. This is different for every person. A dietitian can help you make a meal plan and calculate how many carbohydrates you should have at each meal andsnack. What foods contain carbohydrates? Carbohydrates are found in the following foods: Grains, such as breads and cereals. Dried beans and soy products. Starchy vegetables, such as potatoes, peas, and corn. Fruit and fruit juices. Milk and yogurt. Sweets and snack foods, such as cake, cookies, candy, chips, and soft drinks. How do I count carbohydrates in foods? There are two ways to count carbohydrates in food. You can read food labels or learn standard serving sizes of foods. You can use either of the methods or acombination of both. Using the Nutrition Facts label The Nutrition Facts list is included on the labels of almost all packaged foods and beverages in the [place] It includes: The serving size. Information about nutrients in each serving, including the grams (g) of carbohydrate per serving. To use the Nutrition Facts: Decide how many servings you will have. Multiply the number of servings by the number of carbohydrates per serving. The resulting number is the total amount of carbohydrates that you will be having. Learning the standard serving sizes of foods When you eat carbohydrate foods that are not packaged or do not include Nutrition Facts on the label, you need to measure the servings in order to count the  amount of carbohydrates. Measure the foods that you will eat with a food scale or measuring cup, if needed. Decide how many standard-size servings you will eat. Multiply the number of servings by 15. For foods that contain carbohydrates, one serving equals 15 g of carbohydrates. For example, if you eat 2 cups or 10 oz (300 g) of strawberries, you will have eaten 2 servings and 30 g of carbohydrates (2 servings x 15 g = 30 g). For foods that have more than one food mixed, such as soups and casseroles, you must count the carbohydrates in each food that is included. The following list contains standard serving sizes of common carbohydrate-rich foods. Each of these servings has about 15 g of carbohydrates: 1 slice of bread. 1 six-inch (15 cm) tortilla. ? cup or 2 oz (53 g) cooked rice or pasta.  cup or 3 oz (85 g) cooked or canned, drained and rinsed beans or lentils.  cup or 3 oz (85 g) starchy vegetable, such as peas, corn, or squash.  cup or 4 oz (120 g) hot cereal.  cup or 3 oz (85 g) boiled or mashed potatoes, or  or 3 oz (85 g) of a large baked potato.  cup or 4 fl oz (118 mL) fruit juice. 1 cup or 8 fl oz (237 mL) milk. 1 small or 4 oz (106 g) apple.  or 2 oz (63 g) of a medium banana. 1 cup or 5 oz (150 g) strawberries. 3 cups or 1 oz (24 g) popped popcorn. What is an example of carbohydrate counting? To calculate the number of carbohydrates in this sample meal, follow the [place] below. Sample meal 3 oz (85 g) chicken breast. ? cup or 4 oz (106 g) brown rice.    cup or 3 oz (85 g) corn. 1 cup or 8 fl oz (237 mL) milk. 1 cup or 5 oz (150 g) strawberries with sugar-free whipped topping. Carbohydrate calculation Identify the foods that contain carbohydrates: Rice. Corn. Milk. Strawberries. Calculate how many servings you have of each food: 2 servings rice. 1 serving corn. 1 serving milk. 1 serving strawberries. Multiply each number of servings by 15 g: 2 servings  rice x 15 g = 30 g. 1 serving corn x 15 g = 15 g. 1 serving milk x 15 g = 15 g. 1 serving strawberries x 15 g = 15 g. Add together all of the amounts to find the total grams of carbohydrates eaten: 30 g + 15 g + 15 g + 15 g = 75 g of carbohydrates total. What are tips for following this plan? Shopping Develop a meal plan and then make a shopping list. Buy fresh and frozen vegetables, fresh and frozen fruit, dairy, eggs, beans, lentils, and whole grains. Look at food labels. Choose foods that have more fiber and less sugar. Avoid processed foods and foods with added sugars. Meal planning Aim to have the same amount of carbohydrates at each meal and for each snack time. Plan to have regular, balanced meals and snacks. Where to find more information American Diabetes Association: www.diabetes.org Centers for Disease Control and Prevention: www.cdc.gov Summary Carbohydrate counting is a method of keeping track of how many carbohydrates you eat. Eating carbohydrates naturally increases the amount of sugar (glucose) in the blood. Counting how many carbohydrates you eat improves your blood glucose control, which helps you manage your diabetes. A dietitian can help you make a meal plan and calculate how many carbohydrates you should have at each meal and snack. This information is not intended to replace advice given to you by your health care provider. Make sure you discuss any questions you have with your healthcare provider. Document Revised: 06/15/2019 Document Reviewed: 06/16/2019 Elsevier Patient Education  2021 [employer]  

## 2021-02-19 NOTE — Therapy (Signed)
[place], [place], [address] Phone: [phone]   Fax:  [phone]  Physical Therapy Evaluation  Patient Details  Name: Katelyn Cline MRN: [id number] Date of Birth: [birth year] Referring Provider (PT): Jeanella Anton, NP   Encounter Date: 02/18/2021   PT End of Session - 02/19/21 2217     Visit Number 1    Number of Visits 9    Date for PT Re-Evaluation 06/14/21    Authorization Type [employer] MEDICARE HMO    Progress Note Due on Visit 9    PT Start Time 1100    PT Stop Time 1200    PT Time Calculation (min) 60 min    Equipment Utilized During Treatment Other (comment)   [place]   Activity Tolerance Patient tolerated treatment well    Behavior During Therapy [facility] for tasks assessed/performed             Past Medical History:  Diagnosis Date   Agoraphobia    Arthritis    "all joints"   Chronic low back pain    Chronic respiratory failure with hypoxia ([name])    4L  via [place],  followed by pcp,   (09-16-2018  per pt only uses while at home and at night ,  portable oxygen is not working)   COPD (chronic obstructive pulmonary disease) ([place])    DDD (degenerative disc disease), lumbosacral    Dental caries    DM type 2 (diabetes mellitus, type 2) ([place])    followed by pcp   Fibromyalgia    GERD (gastroesophageal reflux disease)    occasional , no meds   History of basal cell carcinoma (BCC) excision    s/p  Moh's of face/ nose 12/ 2013   History of palpitations    per pt found to have occaional PVCs   History of UTI    HTN (hypertension)    Hyperlipidemia    Loose, teeth    Major depression    Metastatic melanoma ([place]) 10/18/2012   12 mm posterior right upper lobe pulmonary nodule, max SUV 3.0  s/p  right wedge resection 10-21-2012,  followed by oncologist-- dr Irene Limbo,  no recurrence   Mixed stress and urge urinary incontinence    Myeloproliferative neoplasm ([place])    Narcolepsy    OSA  (obstructive sleep apnea)    Oxygen dependent    4 L via [place],  per pt portable oxygen not working but does use while at home and at night   Panic attacks    Periodontitis    Peripheral neuropathy    feet   Polycythemia vera(238.4) hematology/ oncology-- dr Irene Limbo ([facility])   first dx 02014 due to smoker/copd--  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)  hx phlebotomies   Pulmonary nodules    bilateral small stable per CT 03-11-2018   Scoliosis    Smokers' cough ([place])    per pt productive a little in the morning's   Wears glasses     Past Surgical History:  Procedure Laterality Date   ABDOMINAL HYSTERECTOMY  1998   BREAST LUMPECTOMY Bilateral 1995   benign per pt   COLONOSCOPY W/ BIOPSIES AND POLYPECTOMY     Hx: of   CYSTECTOMY  2000   abdominal wall    DILATION AND CURETTAGE OF UTERUS  yrs ago   MOHS SURGERY  2013   nose/face   MULTIPLE EXTRACTIONS WITH ALVEOLOPLASTY N/A 09/19/2018   Procedure: Extraction of tooth #'s 2,  3, 6-14, 18, and 20-30 with alveoloplasty;  Surgeon: [name], DDS;  Location: [facility];  Service: Oral Surgery;  Laterality: N/A;  GENERAL WITH NASAL TUBE   VIDEO ASSISTED THORACOSCOPY (VATS)/WEDGE RESECTION Right 10/21/2012   Procedure: VIDEO ASSISTED THORACOSCOPY (VATS)/WEDGE RESECTION;  Surgeon: [name], MD;  Location: [place];  Service: Thoracic;  Laterality: Right;   VIDEO BRONCHOSCOPY N/A 10/21/2012   Procedure: VIDEO BRONCHOSCOPY;  Surgeon: [name], MD;  Location: [place];  Service: Thoracic;  Laterality: N/A;      There were no vitals filed for this visit.  Symptoms/Limitations    Subjective  -- Pt reports a chronic Hx of back pain from her neck to her low back. Pt notes her primary concern for her neck and mid back pain, although all areas of her back can be painful. Pt states she has degenerative arthritis all over her body. Pt request her neck be the 1st area assessed.  Patient is accompained by:   -- --  Pertinent History  -- Arthritis, all joints; DDD; COPD; obesity; DM2; peripheral neuropathy; metastatic melanoma  Limitations  -- Standing; Walking; House hold activities  How long can you sit comfortably?  -- 30 mins  How long can you stand comfortably?  -- 5 mins  How long can you walk comfortably?  -- Depends on the day  Diagnostic tests  -- Taken at [facility]  Patient Stated Goals  -- To be more active, go fishing, grow a raised garden  Pain Assessment    Currently in Pain?  -- Yes  Pain Score   0-No painPain Score. 0-No pain. Data is from another encounter. Last Filed Value  Pain Location  -- NeckPain Location. Neck. Last Filed Value  Pain Orientation  -- Right; Left  Pain Descriptors / Indicators  -- [place]; Aching  Pain Type  -- Chronic pain  Pain Radiating Towards  -- HAs, L anterior thigh and lower leg intermittently  Pain Onset  -- More than a month ago  Pain Frequency  -- Constant  Aggravating Factors   -- Turning head  Pain Relieving Factors  -- Medication, heating pad  Effect of Pain on Daily Activities  -- --  Multiple Pain Sites  -- --         [facility] PT Assessment - 02/19/21 0001       Assessment   Medical Diagnosis Other intervertebral disc degeneration, lumbar region    Referring Provider (PT) Jeanella Anton, NP    Onset Date/Surgical Date --   for years   Hand Dominance Right    Next MD Visit monthly    Prior Therapy No      Precautions   Precautions None      Restrictions   Weight Bearing Restrictions No      Balance Screen   Has the patient fallen in the past 6 months No      [place] residence    Living Arrangements Children    Type of [place] to enter    Entrance Stairs-Number of Steps 4    Entrance Stairs-Rails Can reach both    Home Layout One level      Prior Function   Level of Independence Independent with household mobility with device;Independent with  community mobility with device    Vocation On disability      Cognition   Overall Cognitive Status Within Functional Limits for tasks  assessed      Observation/Other Assessments   Scoliosis Min R thoracic rib hump    Focus on Therapeutic Outcomes (FOTO)  65%      Sensation   Light Touch Impaired by gross assessment   LT of feet     Posture/Postural Control   Posture/Postural Control Postural limitations    Postural Limitations Forward head;Rounded Shoulders;Decreased lumbar lordosis;Decreased thoracic kyphosis   R rib hump     ROM / Strength   AROM / PROM / Strength AROM;Strength      AROM   AROM Assessment Site Cervical    Cervical Flexion 30d c jabbing pain    Cervical Extension 30d c pressure, stabbing pian    Cervical - Right Side Bend 20d pain on R    Cervical - Left Side Bend 15d pain on L    Cervical - Right Rotation 50d, min to no pain    Cervical - Left Rotation 50d, min to no pain      Strength   Overall Strength Comments Myotomal screen was negative for the UEs and LEs      Transfers   Transfers Sit to Stand;Stand to Sit    Sit to Stand 6: Modified independent (Device/Increase time)    Five time sit to stand comments  33.7 sec      Ambulation/Gait   Ambulation/Gait Yes    Ambulation/Gait Assistance 6: Modified independent (Device/Increase time)    Gait Pattern Decreased step length - right;Decreased step length - left   Out toeing   Gait velocity Decrease pace                        Objective measurements completed on examination: See above findings.       [facility] Adult PT Treatment/Exercise - 02/19/21 0001       Exercises   Exercises Neck      Neck Exercises: Seated   Neck Retraction 10 reps;3 secs                      PT Short Term Goals - 02/19/21 2237       PT SHORT TERM GOAL #1   Title Pt will be Ind in a HEP    Baseline started on eval    Status New    Target Date 03/19/21               PT Long Term  Goals - 02/19/21 2239       PT LONG TERM GOAL #1   Title Pt will voice understading of measures to help manage her chronic pain    Status New    Target Date 06/14/21      PT LONG TERM GOAL #2   Title Improve cervical ROM for flex, ext, and L and R side bending by 10d improved mobility to improve pt's safety with driving    Status New    Target Date 06/14/21      PT LONG TERM GOAL #3   Title Pt will report improved participation in activities which improve her quality of life.    Status New    Target Date 06/14/21      PT LONG TERM GOAL #4   Title Pt will be in a final HEP to maintain achieved LOF    Status New    Target Date 06/14/21      PT LONG TERM GOAL #5   Title Set goals for mid  and low back as indicated                     Patient will benefit from skilled therapeutic intervention in order to improve the following deficits and impairments:  Abnormal gait, Decreased range of motion, Difficulty walking, Decreased endurance, Obesity, Decreased activity tolerance, Pain, Decreased balance, Decreased mobility, Decreased strength, Postural dysfunction  Visit Diagnosis: Cervical pain  Other intervertebral disc degeneration, lumbar region  Decreased ROM of neck  Other abnormalities of gait and mobility     Problem List Patient Active Problem List   Diagnosis Date Noted   MPN (myeloproliferative neoplasm) ([place])    Leukocytosis 03/12/2018   Symptoms of upper respiratory infection (URI) 03/12/2018   Dental caries 03/12/2018   Aortic atherosclerosis ([place]) 02/21/2018   Constipation 05/28/2015   OE (otitis externa) 01/04/2015   Seborrheic keratoses 08/24/2014   PVC (premature ventricular contraction) 05/18/2014   Diabetes mellitus type II, controlled ([place]) 02/06/2013   Chronic hypoxemic respiratory failure ([place]) 02/06/2013   History of basal cell carcinoma 12/01/2012   Posttraumatic stress disorder 12/01/2012   History of malignant melanoma 10/18/2012    Polycythemia vera ([place]) 08/05/2012   Hyperlipidemia 04/09/2012   Narcolepsy 04/09/2012   Peripheral neuropathy 04/09/2012   Tobacco use disorder 04/09/2012   Obesity 04/09/2012   Essential hypertension, benign 04/09/2012   DDD (degenerative disc disease), lumbar 02/16/2012   DDD (degenerative disc disease), cervical 02/16/2012   Chronic pain disorder 02/16/2012   Depression 02/16/2012   Fibromyalgia 02/16/2012   Gar Ponto MS, PT 02/19/21 11:04 PM   [place] [facility] [address] [place], [place], [address] Phone: [phone]   Fax:  [phone]  Name: Katelyn Cline MRN: [id number] Date of Birth: Nov 13, 1960  Referring diagnosis? Other intervertebral disc degeneration, lumbar region  Treatment diagnosis? (if different than referring diagnosis) Cervical pain, Decreased ROM of neck, Other abnormalities of gait and mobility  What was this (referring dx) caused by? []  Surgery []  Fall [x]  Ongoing issue [x]  Arthritis []  Other: ____________  Laterality: []  Rt []  Lt [x]  Both  Check all possible CPT codes:      [x]  97110 (Therapeutic Exercise)  []  01007 (SLP Treatment)  []  97112 (Neuro Re-ed)   []  92526 (Swallowing Treatment)   [x]  97116 (Gait Training)   []  [id number] (Cognitive Training, 1st 15 minutes) [x]  97140 (Manual Therapy)   []  97130 (Cognitive Training, each add'l 15 minutes)  [x]  97530 (Therapeutic Activities)  []  Other, List CPT Code ____________    [x]  97535 (Self Care)       []  All codes above (97110 - 97535)  []  97012 (Mechanical Traction)  [x]  97014 (E-stim Unattended)  [x]  97032 (E-stim manual)  []  97033 (Ionto)  []  97035 (Ultrasound)  []  97760 (Orthotic Fit) []  [id number] (Physical Performance Training) []  [id number] (Aquatic Therapy) []  97034 (Contrast Bath) []  [id number] (Paraffin) []  97597 (Wound Care 1st 20 sq cm) []  97598 (Wound Care each add'l 20 sq cm) []  97016 (Vasopneumatic Device) []  [id number] Psychologist, occupational) []  [id number] (Prosthetic Training)

## 2021-02-19 NOTE — Progress Notes (Signed)
Subjective:    Patient ID: [name], female    DOB: Jan 14, 1961, 60 y.o.   MRN: [id number]  HPI: FILICIA SCOGIN is a 60 y.o. female presenting for follow up and to establish care with new provider.   Chief Complaint  Patient presents with   Diabetes   HYPERTENSION / HYPERLIPIDEMIA Currently taking amlodipine 5 mg daily, HCTZ 25 mg daily, lisinopril 5 mg daily, atorvastatin 80 mg daily, and fenofibrate 145 mg daily.  Has not taken medication yet today. Satisfied with current treatment? yes Duration of hypertension: chronic BP monitoring frequency: not checking BP medication side effects: no Duration of hyperlipidemia: chronic Cholesterol medication side effects: no Aspirin: yes Recent stressors: no Recurrent headaches: no Visual changes: no Palpitations: yes; at one time felt like she was missing beats ; this is stable for her Dyspnea: no Chest pain: no Lower extremity edema: no Dizzy/lightheaded: no  DIABETES Currently taking metformin 1000 mg twice daily.  She also takes gabapentin 100 to 300 mg daily as needed for nerve pain. Hypoglycemic episodes:no Polydipsia/polyuria: no Visual disturbance: no Chest pain: no Paresthesias: yes; feet and sometimes finger Glucose Monitoring: no; 135-150 Taking Insulin?: no Blood Pressure Monitoring: not checking Retinal Examination: Not up to Date Foot Exam: Up to Date Diabetic Education: Completed Pneumovax: Up to Date Influenza: Up to Date Aspirin: yes  Chronic respiratory failure related to polycythemia vera-is on home oxygen, no inhalers yet.  Reports breathing is stable.  Her portable oxygen machine is broken and will not hold a charge-she is not wearing oxygen today in clinic, however her oxygen saturations are stable.  She reports she does this frequently when she goes out for short periods of time and has no issues with her oxygen.  DEPRESSION Currently taking Cymbalta 120 mg daily and Wellbutrin 100 mg twice  daily.  She reports since her service dog passed away 2 years ago, she has been a bit more sad.  However, she is not interested in changing her medication today. Mood status: stable Satisfied with current treatment?: yes Symptom severity: moderate  Duration of current treatment : chronic Side effects: no Medication compliance: good compliance Psychotherapy/counseling: no Depressed mood: yes Anxious mood: yes Anhedonia: yes Significant weight loss or gain: no Insomnia: yes Fatigue: yes Feelings of worthlessness or guilt: yes Impaired concentration/indecisiveness: yes Suicidal ideations: no Hopelessness: no Crying spells: yes Depression screen [facility] 2/9 02/19/2021 06/18/2020 10/16/2019 07/18/2018 03/23/2018  Decreased Interest 3 2 2 2 2   Down, Depressed, Hopeless 1 2 2 2 2   PHQ - 2 Score 4 4 4 4 4   Altered sleeping 2 1 2 2 2   Tired, decreased energy 3 1 2 2 2   Change in appetite 3 0 0 1 1  Feeling bad or failure about yourself  3 1 1 1 1   Trouble concentrating 2 1 2 1 1   Moving slowly or fidgety/restless 3 0 0 0 2  Suicidal thoughts 0 0 0 0 0  PHQ-9 Score 20 8 11 11 13   Difficult doing work/chores Very difficult Somewhat difficult Somewhat difficult Very difficult Very difficult  Some recent data might be hidden    GAD 7 : Generalized Anxiety Score 02/19/2021  Nervous, Anxious, on Edge 2  Control/stop worrying 3  Worry too much - different things 3  Trouble relaxing 2  Restless 2  Easily annoyed or irritable 2  Afraid - awful might happen 3  Total GAD 7 Score 17  Anxiety Difficulty Somewhat difficult  TOBACCO USER Patient reports she is working on cutting back smoking.  She smoked 2 packs/day, now smokes 1/2 pack/day.  She is taking Chantix and this is helping. Smoking Status: current smoker Smoking Amount: 0.5 ppd Smoking triggers: stress Type of tobacco use: cigarettes She is working on complete cessation, tolerating Chantix well.  Polycythemia vera-follows closely  with hematology/oncology.  Gets therapeutic phlebotomy.  Fibromyalgia-pain is chronic.  Follows for narcotic medication at [facility].  Also on Cymbalta 120 mg daily which is of benefit for her.  Allergies  Allergen Reactions   Contrast Media [Iodinated Diagnostic Agents] Anaphylaxis   Erythromycin     Stomach pain   Flagyl [Metronidazole] Other (See Comments)    Generalized pain.   Latex Other (See Comments)    Was told to be careful because of Dye allergy   Other     Iodine Dye   Penicillins Other (See Comments)    Patient was an infant, no idea of reaction. Tolerates Keflex. Did it involve swelling of the face/tongue/throat, SOB, or low BP? Unknown Did it involve sudden or severe rash/hives, skin peeling, or any reaction on the inside of your mouth or nose? Unknown Did you need to seek medical attention at a hospital or doctor's office? Unknown When did it last happen?      infant If all above answers are "NO", may proceed with cephalosporin use.    Tetracyclines & Related Other (See Comments)    GI side effects    Outpatient Encounter Medications as of 02/19/2021  Medication Sig   amLODipine (NORVASC) 5 MG tablet TAKE 1 TABLET(5 MG) BY MOUTH DAILY   aspirin EC 81 MG tablet Take 81 mg by mouth daily.   atorvastatin (LIPITOR) 80 MG tablet TAKE 1 TABLET BY MOUTH DAILY AT 6 PM   Blood Glucose Monitoring Suppl (BLOOD GLUCOSE SYSTEM PAK) KIT Please dispense based on patient and insurance preference. Use as directed to monitor FSBS 2x daily. Dx: E11.9   buPROPion (WELLBUTRIN SR) 100 MG 12 hr tablet TAKE 1 TABLET(100 MG) BY MOUTH TWICE DAILY   DULoxetine (CYMBALTA) 60 MG capsule TAKE TWO CAPSULES BY MOUTH EVERY MORNING   fenofibrate (TRICOR) 145 MG tablet TAKE ONE TABLET BY MOUTH EVERYDAY AT BEDTIME   fish oil-omega-3 fatty acids 1000 MG capsule Take 2 g by mouth 2 (two) times daily.   gabapentin (NEURONTIN) 100 MG capsule Take 1-3 CAPSULES BY MOUTH THREE times daily AS  NEEDED FOR nerve pain   Glucose Blood (BLOOD GLUCOSE TEST STRIPS) STRP Please dispense based on patient and insurance preference. Use as directed to monitor FSBS 2x daily. Dx: E11.9   hydrochlorothiazide (HYDRODIURIL) 25 MG tablet TAKE ONE TABLET BY MOUTH EVERY MORNING   hydroxyurea (HYDREA) 500 MG capsule Take 2 capsules (1,000 mg total) by mouth daily. Take with food to decrease appetite.   lisinopril (ZESTRIL) 5 MG tablet TAKE 1 TABLET (5 MG) BY MOUTH AT BEDTIME   metFORMIN (GLUCOPHAGE) 1000 MG tablet Take 1 tablet (1,000 mg total) by mouth 2 (two) times daily with a meal.   Microlet Lancets MISC Please dispense based on patient and insurance preference. Use as directed to monitor FSBS 2x daily. Dx: E11.9   nystatin (MYCOSTATIN/NYSTOP) powder Apply 1 application topically 3 (three) times daily. PA Case: [id number] Approved 06/30/2019- 06/28/2020   nystatin cream (MYCOSTATIN) Apply 1 application topically 2 (two) times daily. To affected areas   oxyCODONE-acetaminophen (PERCOCET) 10-325 MG tablet Take 1 tablet by mouth 4 (four) times daily  as needed for severe pain.   senna (SENOKOT) 8.6 MG TABS tablet Take 2 tablets by mouth daily as needed for mild constipation.   varenicline (CHANTIX STARTING MONTH PAK) 0.5 MG X 11 & 1 MG X 42 tablet Take one 0.5 mg tablet by mouth once daily for 3 days, then increase to one 0.5 mg tablet twice daily for 4 days, then increase to one 1 mg tablet twice daily.   No facility-administered encounter medications on file as of 02/19/2021.    Patient Active Problem List   Diagnosis Date Noted   MPN (myeloproliferative neoplasm) ([name])    Leukocytosis 03/12/2018   Dental caries 03/12/2018   Aortic atherosclerosis ([name]) 02/21/2018   Constipation 05/28/2015   OE (otitis externa) 01/04/2015   Seborrheic keratoses 08/24/2014   PVC (premature ventricular contraction) 05/18/2014   Type 2 diabetes mellitus with diabetic neuropathy ([name]) 02/06/2013   Chronic hypoxemic  respiratory failure ([name]) 02/06/2013   History of basal cell carcinoma 12/01/2012   Posttraumatic stress disorder 12/01/2012   History of malignant melanoma 10/18/2012   Polycythemia vera ([name]) 08/05/2012   Hyperlipidemia 04/09/2012   Narcolepsy 04/09/2012   Peripheral neuropathy 04/09/2012   Tobacco use disorder 04/09/2012   Obesity 04/09/2012   Essential hypertension, benign 04/09/2012   DDD (degenerative disc disease), lumbar 02/16/2012   DDD (degenerative disc disease), cervical 02/16/2012   Chronic pain disorder 02/16/2012   Depression 02/16/2012   Fibromyalgia 02/16/2012    Past Medical History:  Diagnosis Date   Agoraphobia    Arthritis    "all joints"   Chronic low back pain    Chronic respiratory failure with hypoxia ([name])    4L  via [place],  followed by pcp,   (09-16-2018  per pt only uses while at home and at night ,  portable oxygen is not working)   COPD (chronic obstructive pulmonary disease) ([name])    DDD (degenerative disc disease), lumbosacral    Dental caries    DM type 2 (diabetes mellitus, type 2) ([name])    followed by pcp   Fibromyalgia    GERD (gastroesophageal reflux disease)    occasional , no meds   History of basal cell carcinoma (BCC) excision    s/p  Moh's of face/ nose 12/ 2013   History of palpitations    per pt found to have occaional PVCs   History of UTI    HTN (hypertension)    Hyperlipidemia    Loose, teeth    Major depression    Metastatic melanoma ([name]) 10/18/2012   12 mm posterior right upper lobe pulmonary nodule, max SUV 3.0  s/p  right wedge resection 10-21-2012,  followed by oncologist-- dr Candise Che,  no recurrence   Mixed stress and urge urinary incontinence    Myeloproliferative neoplasm ([name])    Narcolepsy    OSA (obstructive sleep apnea)    Oxygen dependent    4 L via [place],  per pt portable oxygen not working but does use while at home and at night   Panic attacks    Periodontitis    Peripheral neuropathy    feet   Polycythemia  vera(238.4) hematology/ oncology-- dr Candise Che ([facility])   first dx 02014 due to smoker/copd--  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 Interim medical history since our last visit reviewed.  Review of Systems Per HPI unless specifically indicated above     Objective:    BP 130/88   Pulse 78   Temp 98.5 F (36.9 C)   Ht $R'5\' 8"'AL$  (1.727 m)   Wt 227 lb 9.6 oz (103.2 kg)   SpO2 95%   BMI 34.61 kg/m   Wt Readings from Last 3 Encounters:  02/19/21 227 lb 9.6 oz (103.2 kg)  01/24/21 231 lb 1.6 oz (104.8 kg)  10/25/20 236 lb 4.8 oz (107.2 kg)    Physical Exam Vitals and nursing note reviewed.  Constitutional:      General: She is not in acute distress.    Appearance: Normal appearance. She is obese. She is not toxic-appearing.  HENT:     Head: Normocephalic and atraumatic.     Right Ear: External ear normal.     Left Ear: External ear normal.  Eyes:     General: No scleral icterus.    Extraocular Movements: Extraocular movements intact.  Neck:     Vascular: No carotid bruit.  Cardiovascular:     Rate and Rhythm: Normal rate and regular rhythm.     Heart sounds: Murmur heard.  Pulmonary:     Effort: Pulmonary effort is normal. No respiratory distress.     Breath sounds: Normal breath sounds. No wheezing, rhonchi or rales.  Musculoskeletal:        General: No swelling or tenderness. Normal range of motion.     Cervical back: Normal range of motion.     Right lower leg: No edema.     Left lower leg: No edema.  Skin:    General: Skin is warm and dry.     Capillary Refill: Capillary refill takes less than 2 seconds.      Coloration: Skin is not jaundiced.     Findings: No bruising.  Neurological:     General: No focal deficit present.     Mental Status: She is alert and oriented to person, place, and time.     Motor: No weakness.     Gait: Gait normal.  Psychiatric:        Mood and Affect: Mood normal.        Behavior: Behavior normal.        Thought Content: Thought content normal.        Judgment: Judgment normal.       Assessment & Plan:   Problem List Items Addressed This Visit       Cardiovascular and Mediastinum   Essential hypertension, benign    Chronic.  Blood pressure is acceptable today in clinic.  Continue current medications for now including amlodipine 5 mg daily, hydrochlorothiazide 25 mg daily, and lisinopril 5 mg daily.  We will check kidney function with electrolytes today.  Encouraged patient to check blood pressure at home with automatic arm blood pressure cuff and record readings to bring to next appointment.  Encouraged DASH diet.  Follow-up in 3 months.      Aortic atherosclerosis ([name]) - Primary    Chronic.  Patient maintains on atorvastatin 80 mg daily, also on fenofibrate for triglycerides.  We will check lipids today-patient is fasting.  LDL goal less than 70 given diabetes.        Respiratory   Chronic hypoxemic  respiratory failure ([name])    Chronic.  Patient maintains on oxygen therapy as overseen by oncologist.  Continue collaboration.        Endocrine   Type 2 diabetes mellitus with diabetic neuropathy ([name])    Chronic.  Patient maintains on metformin 1000 mg twice daily and gabapentin 100 to 300 mg daily for neuropathy.  We will check her A1c today.  She is on high-dose statin.  Foot exam today.  Will refer to ophthalmologist in [facility] for eye exam.  Continue diet low in simple sugars and watch carbohydrates.  Follow-up in 3 months.        Nervous and Auditory   Peripheral neuropathy    Chronic.  Stable on gabapentin and Cymbalta.  Continue these  medications.        Other   Tobacco use disorder    Chronic.  Patient is working on cutting back to quitting.  Has cut back from 2 packs/day to 0.5 packs/day.  Congratulated on this great success and recommended complete cessation-continue use of Chantix as needed.      Polycythemia vera ([name])    Chronic.  Follows closely with hematology-continue collaboration.      Hyperlipidemia    Chronic.  LDL goal for this patient is less than 70 given comorbid diabetes.  Currently taking atorvastatin 80 mg daily and fenofibrate.  Consider adding in Zetia if LDL not at goal of less than 70.  We will check lipids today-follow-up in 3 months.      Relevant Orders   Lipid panel (Completed)   Fibromyalgia    Chronic.  Maintained on Cymbalta 120 mg daily.  Continue this medication.      Depression    Chronic.  Reports her mood is stable, however still grieving the loss from her dog.  PHQ-9 and GAD-7 are elevated.  Not interested in talking with a therapist at this time and not interested in changing her medications.  We will continue Cymbalta 120 mg and Wellbutrin 100 mg twice daily for now.  Follow-up in 3 months.      Chronic pain disorder    Follows with pain management-continue collaboration at [facility].      Other Visit Diagnoses     Murmur, cardiac       Murmur heard on examination today-we will obtain complete echocardiogram to check for structural abnormality.   Relevant Orders   ECHOCARDIOGRAM COMPLETE   Screening for colon cancer       Patient is aware she is overdue for multiple screenings-wishes to do about 1/month and start with colon cancer screening.  Referral placed.   Relevant Orders   Ambulatory referral to Gastroenterology        Follow up plan: Return in about 3 months (around 05/22/2021).

## 2021-02-20 LAB — LIPID PANEL
Cholesterol: 143 mg/dL (ref <200)
HDL: 35 mg/dL — ABNORMAL LOW (ref >=50)
LDL Cholesterol (Calc): 78 mg/dL (calc)
Non-HDL Cholesterol (Calc): 108 mg/dL (calc) (ref <130)
Total CHOL/HDL Ratio: 4.1 (calc) (ref <5.0)
Triglycerides: 208 mg/dL — ABNORMAL HIGH (ref <150)

## 2021-02-20 LAB — HEMOGLOBIN A1C
Hgb A1c MFr Bld: 6.1 % of total Hgb — ABNORMAL HIGH (ref <5.7)
Mean Plasma Glucose: 128 mg/dL
eAG (mmol/L): 7.1 mmol/L

## 2021-02-21 ENCOUNTER — Encounter: Payer: Self-pay | Admitting: Nurse Practitioner

## 2021-02-21 NOTE — Assessment & Plan Note (Signed)
Chronic.  Follows closely with hematology-continue collaboration.

## 2021-02-21 NOTE — Assessment & Plan Note (Signed)
Chronic.  LDL goal for this patient is less than 70 given comorbid diabetes.  Currently taking atorvastatin 80 mg daily and fenofibrate.  Consider adding in Zetia if LDL not at goal of less than 70.  We will check lipids today-follow-up in 3 months.

## 2021-02-21 NOTE — Assessment & Plan Note (Signed)
Chronic.  Blood pressure is acceptable today in clinic.  Continue current medications for now including amlodipine 5 mg daily, hydrochlorothiazide 25 mg daily, and lisinopril 5 mg daily.  We will check kidney function with electrolytes today.  Encouraged patient to check blood pressure at home with automatic arm blood pressure cuff and record readings to bring to next appointment.  Encouraged DASH diet.  Follow-up in 3 months.

## 2021-02-21 NOTE — Assessment & Plan Note (Signed)
Chronic.  Maintained on Cymbalta 120 mg daily.  Continue this medication.

## 2021-02-21 NOTE — Assessment & Plan Note (Signed)
Chronic.  Patient is working on cutting back to quitting.  Has cut back from 2 packs/day to 0.5 packs/day.  Congratulated on this great success and recommended complete cessation-continue use of Chantix as needed.

## 2021-02-21 NOTE — Assessment & Plan Note (Signed)
Chronic.  Reports her mood is stable, however still grieving the loss from her dog.  PHQ-9 and GAD-7 are elevated.  Not interested in talking with a therapist at this time and not interested in changing her medications.  We will continue Cymbalta 120 mg and Wellbutrin 100 mg twice daily for now.  Follow-up in 3 months.

## 2021-02-21 NOTE — Assessment & Plan Note (Signed)
Follows with pain management-continue collaboration at [facility].

## 2021-02-21 NOTE — Assessment & Plan Note (Signed)
Chronic.  Patient maintains on metformin 1000 mg twice daily and gabapentin 100 to 300 mg daily for neuropathy.  We will check her A1c today.  She is on high-dose statin.  Foot exam today.  Will refer to ophthalmologist in [facility] for eye exam.  Continue diet low in simple sugars and watch carbohydrates.  Follow-up in 3 months.

## 2021-02-21 NOTE — Assessment & Plan Note (Addendum)
Chronic.  Patient maintains on oxygen therapy as overseen by oncologist.  Continue collaboration.

## 2021-02-21 NOTE — Assessment & Plan Note (Signed)
Chronic.  Stable on gabapentin and Cymbalta.  Continue these medications.

## 2021-02-21 NOTE — Assessment & Plan Note (Signed)
Chronic.  Patient maintains on atorvastatin 80 mg daily, also on fenofibrate for triglycerides.  We will check lipids today-patient is fasting.  LDL goal less than 70 given diabetes.

## 2021-02-24 ENCOUNTER — Other Ambulatory Visit: Payer: Self-pay | Admitting: Hematology

## 2021-02-24 ENCOUNTER — Inpatient Hospital Stay: Payer: Medicare HMO | Attending: Hematology

## 2021-02-24 ENCOUNTER — Other Ambulatory Visit: Payer: Self-pay

## 2021-02-24 ENCOUNTER — Inpatient Hospital Stay: Payer: Medicare HMO

## 2021-02-24 DIAGNOSIS — E119 Type 2 diabetes mellitus without complications: Secondary | ICD-10-CM | POA: Insufficient documentation

## 2021-02-24 DIAGNOSIS — Z7982 Long term (current) use of aspirin: Secondary | ICD-10-CM | POA: Insufficient documentation

## 2021-02-24 DIAGNOSIS — Z298 Encounter for other specified prophylactic measures: Secondary | ICD-10-CM | POA: Diagnosis present

## 2021-02-24 DIAGNOSIS — F17210 Nicotine dependence, cigarettes, uncomplicated: Secondary | ICD-10-CM | POA: Insufficient documentation

## 2021-02-24 DIAGNOSIS — Z79899 Other long term (current) drug therapy: Secondary | ICD-10-CM | POA: Insufficient documentation

## 2021-02-24 DIAGNOSIS — D45 Polycythemia vera: Secondary | ICD-10-CM

## 2021-02-24 DIAGNOSIS — Z85820 Personal history of malignant melanoma of skin: Secondary | ICD-10-CM | POA: Insufficient documentation

## 2021-02-24 DIAGNOSIS — Z9221 Personal history of antineoplastic chemotherapy: Secondary | ICD-10-CM | POA: Diagnosis not present

## 2021-02-24 DIAGNOSIS — Z7984 Long term (current) use of oral hypoglycemic drugs: Secondary | ICD-10-CM | POA: Diagnosis not present

## 2021-02-24 DIAGNOSIS — D471 Chronic myeloproliferative disease: Secondary | ICD-10-CM

## 2021-02-24 LAB — CMP (CANCER CENTER ONLY)
ALT: 11 U/L (ref 0–44)
AST: 11 U/L — ABNORMAL LOW (ref 15–41)
Albumin: 3.3 g/dL — ABNORMAL LOW (ref 3.5–5.0)
Alkaline Phosphatase: 99 U/L (ref 38–126)
Anion gap: 12 (ref 5–15)
BUN: 13 mg/dL (ref 8–23)
CO2: 27 mmol/L (ref 22–32)
Calcium: 9.7 mg/dL (ref 8.9–10.3)
Chloride: 100 mmol/L (ref 98–111)
Creatinine: 0.72 mg/dL (ref 0.44–1.00)
GFR, Estimated: >60 mL/min (ref >60)
Glucose, Bld: 203 mg/dL — ABNORMAL HIGH (ref 70–99)
Potassium: 3.9 mmol/L (ref 3.5–5.1)
Sodium: 139 mmol/L (ref 135–145)
Total Bilirubin: 0.3 mg/dL (ref 0.3–1.2)
Total Protein: 7.2 g/dL (ref 6.5–8.1)

## 2021-02-24 LAB — CBC WITH DIFFERENTIAL/PLATELET
Abs Immature Granulocytes: 0.07 10*3/uL (ref 0.00–0.07)
Basophils Absolute: 0.1 10*3/uL (ref 0.0–0.1)
Basophils Relative: 1 %
Eosinophils Absolute: 0.3 10*3/uL (ref 0.0–0.5)
Eosinophils Relative: 2 %
HCT: 45.5 % (ref 36.0–46.0)
Hemoglobin: 13.7 g/dL (ref 12.0–15.0)
Immature Granulocytes: 1 %
Lymphocytes Relative: 11 %
Lymphs Abs: 1.3 10*3/uL (ref 0.7–4.0)
MCH: 26 pg (ref 26.0–34.0)
MCHC: 30.1 g/dL (ref 30.0–36.0)
MCV: 86.5 fL (ref 80.0–100.0)
Monocytes Absolute: 0.3 10*3/uL (ref 0.1–1.0)
Monocytes Relative: 3 %
Neutro Abs: 9.8 10*3/uL — ABNORMAL HIGH (ref 1.7–7.7)
Neutrophils Relative %: 82 %
Platelets: 394 10*3/uL (ref 150–400)
RBC: 5.26 MIL/uL — ABNORMAL HIGH (ref 3.87–5.11)
RDW: 19.4 % — ABNORMAL HIGH (ref 11.5–15.5)
WBC: 11.8 10*3/uL — ABNORMAL HIGH (ref 4.0–10.5)
nRBC: 0.3 % — ABNORMAL HIGH (ref 0.0–0.2)

## 2021-02-24 MED ORDER — CILGAVIMAB (PART OF EVUSHELD) INJECTION
300.0000 mg | Freq: Once | INTRAMUSCULAR | Status: AC
  Administered 2021-02-24: 300 mg via INTRAMUSCULAR

## 2021-02-24 MED ORDER — TIXAGEVIMAB (PART OF EVUSHELD) INJECTION
300.0000 mg | Freq: Once | INTRAMUSCULAR | Status: AC
  Administered 2021-02-24: 300 mg via INTRAMUSCULAR

## 2021-02-24 NOTE — Progress Notes (Signed)
Patient observed for 30-minutes following evusheld injection. Pt d/c in stable condition.

## 2021-02-24 NOTE — Progress Notes (Signed)
Per [name] - patient will not require phlebotomy today.

## 2021-03-05 ENCOUNTER — Telehealth: Payer: Self-pay | Admitting: Pharmacist

## 2021-03-05 NOTE — Progress Notes (Addendum)
Chronic Care Management Pharmacy Assistant   Name: Katelyn Cline  MRN: [id number] DOB: 16-Mar-1961  Reason for Encounter: Medication Review/Medication Coordination.   Medications: Outpatient Encounter Medications as of 03/05/2021  Medication Sig   amLODipine (NORVASC) 5 MG tablet TAKE 1 TABLET(5 MG) BY MOUTH DAILY   aspirin EC 81 MG tablet Take 81 mg by mouth daily.   atorvastatin (LIPITOR) 80 MG tablet TAKE 1 TABLET BY MOUTH DAILY AT 6 PM   Blood Glucose Monitoring Suppl (BLOOD GLUCOSE SYSTEM PAK) KIT Please dispense based on patient and insurance preference. Use as directed to monitor FSBS 2x daily. Dx: E11.9   buPROPion (WELLBUTRIN SR) 100 MG 12 hr tablet TAKE 1 TABLET(100 MG) BY MOUTH TWICE DAILY   DULoxetine (CYMBALTA) 60 MG capsule TAKE TWO CAPSULES BY MOUTH EVERY MORNING   fenofibrate (TRICOR) 145 MG tablet TAKE ONE TABLET BY MOUTH EVERYDAY AT BEDTIME   fish oil-omega-3 fatty acids 1000 MG capsule Take 2 g by mouth 2 (two) times daily.   gabapentin (NEURONTIN) 100 MG capsule Take 1-3 CAPSULES BY MOUTH THREE times daily AS NEEDED FOR nerve pain   Glucose Blood (BLOOD GLUCOSE TEST STRIPS) STRP Please dispense based on patient and insurance preference. Use as directed to monitor FSBS 2x daily. Dx: E11.9   hydrochlorothiazide (HYDRODIURIL) 25 MG tablet TAKE ONE TABLET BY MOUTH EVERY MORNING   hydroxyurea (HYDREA) 500 MG capsule Take 2 capsules (1,000 mg total) by mouth daily. Take with food to decrease appetite.   lisinopril (ZESTRIL) 5 MG tablet TAKE 1 TABLET (5 MG) BY MOUTH AT BEDTIME   metFORMIN (GLUCOPHAGE) 1000 MG tablet Take 1 tablet (1,000 mg total) by mouth 2 (two) times daily with a meal.   Microlet Lancets MISC Please dispense based on patient and insurance preference. Use as directed to monitor FSBS 2x daily. Dx: E11.9   nystatin (MYCOSTATIN/NYSTOP) powder Apply 1 application topically 3 (three) times daily. PA Case: [id number] Approved 06/30/2019- 06/28/2020   nystatin cream  (MYCOSTATIN) Apply 1 application topically 2 (two) times daily. To affected areas   oxyCODONE-acetaminophen (PERCOCET) 10-325 MG tablet Take 1 tablet by mouth 4 (four) times daily as needed for severe pain.   senna (SENOKOT) 8.6 MG TABS tablet Take 2 tablets by mouth daily as needed for mild constipation.   varenicline (CHANTIX STARTING MONTH PAK) 0.5 MG X 11 & 1 MG X 42 tablet Take one 0.5 mg tablet by mouth once daily for 3 days, then increase to one 0.5 mg tablet twice daily for 4 days, then increase to one 1 mg tablet twice daily.   No facility-administered encounter medications on file as of 03/05/2021.    Reviewed chart for medication changes ahead of medication coordination call.  No Consults, or hospital visits since last care coordination call.  Office Visit: 02/19/21 Katelyn Nose, NP. For diabetes. No medication changes.   No medication changes indicated.  BP Readings from Last 3 Encounters:  02/24/21 (!) 153/70  02/19/21 130/88  01/24/21 136/73    Lab Results  Component Value Date   HGBA1C 6.1 (H) 02/19/2021     Patient obtains medications through Adherence Packaging  30 Days   Last adherence delivery included:  Hydroxyurea 500 mg 2 tablets at bedtime  Amlodipine 5 mg 1 tablet at bedtime Duloxetine 60 mg 2 tablets at breakfast Fenofibrate 145 mg 1 tablet at bedtime Hctz 25 mg 1 tablet at breakfast Lisinopril 5 mg 1 tablet at bedtime [place] Sr 100 mg  tablet at  breakfast and 1 tablet at bedtime Atorvastatin 80 mg 1 tablet at bedtime Metformin 1000 mg 1 tablet at breakfast and 1 tablet at bedtime. Gabapentin 100 mg 1-3 three times daily as needed  Patient declined meds last month: None.  Patient is due for next adherence delivery on: 03/14/21.  Called patient and reviewed medications and coordinated delivery.  This delivery to include: Fenofibrate 145 mg 1 tablet at bedtime Duloxetine 60 mg 2 tablets at breakfast Gabapentin 100 mg 1-3 three times  daily as needed (Not in packing) Hydroxyurea 500 mg 2 tablets at bedtime  Metformin 1000 mg 1 tablet at breakfast and 1 tablet at bedtime. Amlodipine 5 mg 1 tablet at bedtime Lisinopril 5 mg 1 tablet at bedtime Atorvastatin 80 mg 1 tablet at bedtime [place] Sr 100 mg  tablet at breakfast and 1 tablet at bedtime Hctz 25 mg 1 tablet at breakfast Varenicline 1 mg  Patient declined the following medications: None  Patient needs refills for: Gabapentin-requested.  Confirmed delivery date of 03/14/21, advised patient that pharmacy will contact them the morning of delivery.  Follow-Up:Pharmacist Review  [name], RMA Clinical Pharmacist Assistant [phone]  10 minutes spent in review, coordination, and documentation.  Reviewed by: Beverly Milch, PharmD Clinical Pharmacist [phone]

## 2021-03-10 ENCOUNTER — Telehealth: Payer: Self-pay | Admitting: *Deleted

## 2021-03-10 MED ORDER — GABAPENTIN 100 MG PO CAPS: ORAL_CAPSULE | ORAL | 0 refills | Status: DC

## 2021-03-10 MED ORDER — VARENICLINE TARTRATE 1 MG PO TABS: 1.0000 mg | ORAL_TABLET | Freq: Two times a day (BID) | ORAL | 3 refills | Status: DC

## 2021-03-10 NOTE — Telephone Encounter (Signed)
-----   Message from Rosetta Posner sent at 03/10/2021  1:03 PM EDT ----- Regarding: Refill Hi can you send a refill of Gabapentin to [facility], thanks.

## 2021-03-10 NOTE — Telephone Encounter (Signed)
-----   Message from Rosetta Posner sent at 03/10/2021  1:26 PM EDT ----- Regarding: Refill Hi can you send a refill for Varenicline 1 mg to [facility]. Thanks.

## 2021-03-11 ENCOUNTER — Ambulatory Visit: Payer: Medicare HMO | Attending: Nurse Practitioner

## 2021-03-11 ENCOUNTER — Other Ambulatory Visit: Payer: Self-pay

## 2021-03-11 DIAGNOSIS — R29898 Other symptoms and signs involving the musculoskeletal system: Secondary | ICD-10-CM | POA: Insufficient documentation

## 2021-03-11 DIAGNOSIS — M542 Cervicalgia: Secondary | ICD-10-CM | POA: Insufficient documentation

## 2021-03-11 DIAGNOSIS — R2689 Other abnormalities of gait and mobility: Secondary | ICD-10-CM | POA: Diagnosis present

## 2021-03-11 DIAGNOSIS — M5136 Other intervertebral disc degeneration, lumbar region: Secondary | ICD-10-CM | POA: Diagnosis present

## 2021-03-11 NOTE — Therapy (Addendum)
[place], [place], [address] Phone: [phone]   Fax:  [phone]  Physical Therapy Treatment/Discharge  Patient Details  Name: Katelyn Cline MRN: [id number] Date of Birth: 05/19/1961 Referring Provider (PT): Jeanella Anton, NP   Encounter Date: 03/11/2021   PT End of Session - 03/11/21 1545     Visit Number 2    Number of Visits 9    Date for PT Re-Evaluation 06/14/21    Authorization Type [employer] MEDICARE HMO    PT Start Time 1109    PT Stop Time 1155    PT Time Calculation (min) 46 min    Equipment Utilized During Treatment Other (comment)   [place]   Activity Tolerance Patient tolerated treatment well    Behavior During Therapy [facility] for tasks assessed/performed             Past Medical History:  Diagnosis Date   Agoraphobia    Arthritis    "all joints"   Chronic low back pain    Chronic respiratory failure with hypoxia ([place])    4L  via [place],  followed by pcp,   (09-16-2018  per pt only uses while at home and at night ,  portable oxygen is not working)   COPD (chronic obstructive pulmonary disease) ([place])    DDD (degenerative disc disease), lumbosacral    Dental caries    DM type 2 (diabetes mellitus, type 2) ([place])    followed by pcp   Fibromyalgia    GERD (gastroesophageal reflux disease)    occasional , no meds   History of basal cell carcinoma (BCC) excision    s/p  Moh's of face/ nose 12/ 2013   History of palpitations    per pt found to have occaional PVCs   History of UTI    HTN (hypertension)    Hyperlipidemia    Loose, teeth    Major depression    Metastatic melanoma ([place]) 10/18/2012   12 mm posterior right upper lobe pulmonary nodule, max SUV 3.0  s/p  right wedge resection 10-21-2012,  followed by oncologist-- dr Irene Limbo,  no recurrence   Mixed stress and urge urinary incontinence    Myeloproliferative neoplasm ([employer])    Narcolepsy    OSA (obstructive sleep apnea)     Oxygen dependent    4 L via [place],  per pt portable oxygen not working but does use while at home and at night   Panic attacks    Periodontitis    Peripheral neuropathy    feet   Polycythemia vera(238.4) hematology/ oncology-- dr Irene Limbo ([facility])   first dx 02014 due to smoker/copd--  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)  hx phlebotomies   Pulmonary nodules    bilateral small stable per CT 03-11-2018   Scoliosis    Smokers' cough ([place])    per pt productive a little in the morning's   Symptoms of upper respiratory infection (URI) 03/12/2018   Wears glasses     Past Surgical History:  Procedure Laterality Date   ABDOMINAL HYSTERECTOMY  1998   BREAST LUMPECTOMY Bilateral 1995   benign per pt   COLONOSCOPY W/ BIOPSIES AND POLYPECTOMY     Hx: of   CYSTECTOMY  2000   abdominal wall    DILATION AND CURETTAGE OF UTERUS  yrs ago   MOHS SURGERY  2013   nose/face   MULTIPLE EXTRACTIONS WITH ALVEOLOPLASTY N/A 09/19/2018   Procedure: Extraction of tooth #'s 2,  3, 6-14, 18, and 20-30 with alveoloplasty;  Surgeon: [name], DDS;  Location: [facility];  Service: Oral Surgery;  Laterality: N/A;  GENERAL WITH NASAL TUBE   VIDEO ASSISTED THORACOSCOPY (VATS)/WEDGE RESECTION Right 10/21/2012   Procedure: VIDEO ASSISTED THORACOSCOPY (VATS)/WEDGE RESECTION;  Surgeon: [name], MD;  Location: [facility];  Service: Thoracic;  Laterality: Right;   VIDEO BRONCHOSCOPY N/A 10/21/2012   Procedure: VIDEO BRONCHOSCOPY;  Surgeon: [name], MD;  Location: [facility];  Service: Thoracic;  Laterality: N/A;    There were no vitals filed for this visit.   Subjective Assessment - 03/11/21 1113     Subjective Doing about the same since last PT session. Initially the cervical retraction exs caused more discomfort, but this pain resolved. Pt reports her neck feels good today.    Pertinent History Arthritis, all joints; DDD; COPD; obesity; DM2; peripheral neuropathy; metastatic  melanoma    Limitations Standing;Walking;House hold activities    Currently in Pain? Yes    Pain Score 4     Pain Location Neck    Pain Orientation Right    Pain Descriptors / Indicators Aching    Pain Type Chronic pain    Pain Radiating Towards HAs, L anterior thigh and lower leg intermittently    Pain Onset More than a month ago    Pain Frequency Constant    Aggravating Factors  Turning head    Pain Relieving Factors Medication, heating pad    Multiple Pain Sites Yes    Pain Score 7    Pain Location Back    Pain Orientation Right;Left;Posterior;Mid;Lower    Pain Descriptors / Indicators Aching;Throbbing;Stabbing   stabbing for the mid back   Pain Type Chronic pain    Pain Radiating Towards lateral thigh bilat    Pain Onset More than a month ago    Aggravating Factors  weather, sleep, satnding    Pain Relieving Factors Sitting or lying down usually in a particular position                [facility] PT Assessment - 03/11/21 0001       AROM   AROM Assessment Site Lumbar    Lumbar Flexion 50% limited, pulling pain    Lumbar Extension 505 limited, significant pain readiating out to lateral upper hips.                           [facility] Adult PT Treatment/Exercise - 03/11/21 0001       Exercises   Exercises Lumbar;Neck      Neck Exercises: Seated   Neck Retraction 10 reps;3 secs    Lateral Flexion 10 reps   3 sec   Lateral Flexion Limitations c cervical retraction      Lumbar Exercises: Stretches   Single Knee to Chest Stretch Right;Left;4 reps;10 seconds    Single Knee to Chest Stretch Limitations trunk on wedge, mot able to tolerated supine    Lower Trunk Rotation 5 reps;10 seconds      Lumbar Exercises: Seated   Other Seated Lumbar Exercises trunk flexion; forward and lateral 3x each, 15 sec                     PT Education - 03/11/21 1544     Education Details Eval findings HEP update    Person(s) Educated Patient    Methods  Explanation;Demonstration;Tactile cues;Verbal cues;Handout    Comprehension Verbalized understanding;Returned demonstration;Verbal cues  required;Other (comment);Need further instruction;Tactile cues required              PT Short Term Goals - 02/19/21 2237       PT SHORT TERM GOAL #1   Title Pt will be Ind in a HEP    Baseline started on eval    Status New    Target Date 03/19/21               PT Long Term Goals - 02/19/21 2239       PT LONG TERM GOAL #1   Title Pt will voice understading of measures to help manage her chronic pain    Status New    Target Date 06/14/21      PT LONG TERM GOAL #2   Title Improve cervical ROM for flex, ext, and L and R side bending by 10d improved mobility to improve pt's safety with driving    Status New    Target Date 06/14/21      PT LONG TERM GOAL #3   Title Pt will report improved participation in activities which improve her quality of life.    Status New    Target Date 06/14/21      PT LONG TERM GOAL #4   Title Pt will be in a final HEP to maintain achieved LOF    Status New    Target Date 06/14/21      PT LONG TERM GOAL #5   Title Set goals for mid and low back as indicated                   Plan - 03/11/21 1546     Clinical Impression Statement Trunk pain is most significantly provoked with extension. PT today focused ther ex to address trunk mobility/flexibility. Pt completes her exs in a slow deliberate mannner. Pt tolertaed PT session without adverse effects.    Personal Factors and Comorbidities Past/Current Experience;Fitness;Comorbidity 1;Comorbidity 3+;Time since onset of injury/illness/exacerbation    Comorbidities Arthritis, all joints; DDD; COPD; obesity; DM2; peripheral neuropathy; metastatic melanoma    Examination-Activity Limitations Lift;Locomotion Level;Transfers;Sit;Sleep;Squat;Stairs;Stand    Stability/Clinical Decision Making Evolving/Moderate complexity    Clinical Decision Making Moderate     Rehab Potential Fair    PT Frequency 1x / week   every 2 weeks   PT Duration Other (comment)   16 weeks   PT Treatment/Interventions ADLs/Self Care Home Management;Aquatic Therapy;Moist Heat;Gait training;Stair training;Functional mobility training;Therapeutic activities;Therapeutic exercise;Cryotherapy;Balance training;Neuromuscular re-education;Manual techniques;Patient/family education;Dry needling;Joint Manipulations    PT Next Visit Plan Assess mid and /or low back, assess response to HEP. initiate core and general body strengthening/stability exs.    PT Home Exercise Plan [email or web address]    Consulted and Agree with Plan of Care Patient             Patient will benefit from skilled therapeutic intervention in order to improve the following deficits and impairments:  Abnormal gait, Decreased range of motion, Difficulty walking, Decreased endurance, Obesity, Decreased activity tolerance, Pain, Decreased balance, Decreased mobility, Decreased strength, Postural dysfunction  Visit Diagnosis: Cervical pain  Other intervertebral disc degeneration, lumbar region  Decreased ROM of neck  Other abnormalities of gait and mobility     Problem List Patient Active Problem List   Diagnosis Date Noted   MPN (myeloproliferative neoplasm) ([place])    Leukocytosis 03/12/2018   Dental caries 03/12/2018   Aortic atherosclerosis ([place]) 02/21/2018   Constipation 05/28/2015   OE (otitis externa) 01/04/2015   Seborrheic keratoses 08/24/2014  PVC (premature ventricular contraction) 05/18/2014   Type 2 diabetes mellitus with diabetic neuropathy ([place]) 02/06/2013   Chronic hypoxemic respiratory failure ([place]) 02/06/2013   History of basal cell carcinoma 12/01/2012   Posttraumatic stress disorder 12/01/2012   History of malignant melanoma 10/18/2012   Polycythemia vera ([place]) 08/05/2012   Hyperlipidemia 04/09/2012   Narcolepsy 04/09/2012   Peripheral neuropathy 04/09/2012   Tobacco use disorder  04/09/2012   Obesity 04/09/2012   Essential hypertension, benign 04/09/2012   DDD (degenerative disc disease), lumbar 02/16/2012   DDD (degenerative disc disease), cervical 02/16/2012   Chronic pain disorder 02/16/2012   Depression 02/16/2012   Fibromyalgia 02/16/2012    Gar Ponto, PT 03/11/2021, 6:09 PM  PHYSICAL THERAPY DISCHARGE SUMMARY  Visits from Start of Care: 2  Current functional level related to goals / functional outcomes: Unknown   Remaining deficits: Unknown   Education / Equipment: HEP   Patient agrees to discharge. Patient goals were not met. Patient is being discharged due to not returning since the last visit.  Gar Ponto MS, PT 06/27/21 7:57 AM   [facility] [address] [place], [place], [address] Phone: [phone]   Fax:  [phone]  Name: Katelyn Cline MRN: [id number] Date of Birth: December 13, 1960

## 2021-03-26 ENCOUNTER — Telehealth: Payer: Self-pay | Admitting: Hematology

## 2021-03-26 ENCOUNTER — Ambulatory Visit: Payer: Medicare HMO

## 2021-03-26 NOTE — Telephone Encounter (Signed)
Patient wanting to r/s 9/29 appts. Called, not able to get in touch with patient. No changes were made

## 2021-03-27 ENCOUNTER — Inpatient Hospital Stay: Payer: Medicare HMO

## 2021-03-31 ENCOUNTER — Encounter: Payer: Self-pay | Admitting: Nurse Practitioner

## 2021-04-03 ENCOUNTER — Other Ambulatory Visit: Payer: Self-pay | Admitting: *Deleted

## 2021-04-03 ENCOUNTER — Telehealth: Payer: Self-pay | Admitting: *Deleted

## 2021-04-03 ENCOUNTER — Telehealth: Payer: Self-pay | Admitting: Pharmacist

## 2021-04-03 MED ORDER — FENOFIBRATE 145 MG PO TABS: ORAL_TABLET | ORAL | 1 refills | Status: DC

## 2021-04-03 MED ORDER — OMEGA-3 FATTY ACIDS 1000 MG PO CAPS: 2.0000 g | ORAL_CAPSULE | Freq: Two times a day (BID) | ORAL | 1 refills | Status: DC

## 2021-04-03 MED ORDER — ASPIRIN EC 81 MG PO TBEC: 81.0000 mg | DELAYED_RELEASE_TABLET | Freq: Every day | ORAL | 1 refills | Status: DC

## 2021-04-03 MED ORDER — GABAPENTIN 100 MG PO CAPS: ORAL_CAPSULE | ORAL | 1 refills | Status: DC

## 2021-04-03 MED ORDER — LISINOPRIL 5 MG PO TABS: ORAL_TABLET | ORAL | 1 refills | Status: DC

## 2021-04-03 MED ORDER — DULOXETINE HCL 60 MG PO CPEP: ORAL_CAPSULE | ORAL | 1 refills | Status: DC

## 2021-04-03 MED ORDER — BUPROPION HCL ER (SR) 100 MG PO TB12: ORAL_TABLET | ORAL | 1 refills | Status: DC

## 2021-04-03 MED ORDER — METFORMIN HCL 1000 MG PO TABS: 1000.0000 mg | ORAL_TABLET | Freq: Two times a day (BID) | ORAL | 1 refills | Status: DC

## 2021-04-03 MED ORDER — AMLODIPINE BESYLATE 5 MG PO TABS: ORAL_TABLET | ORAL | 1 refills | Status: DC

## 2021-04-03 MED ORDER — ATORVASTATIN CALCIUM 80 MG PO TABS: ORAL_TABLET | ORAL | 1 refills | Status: DC

## 2021-04-03 MED ORDER — HYDROCHLOROTHIAZIDE 25 MG PO TABS: ORAL_TABLET | ORAL | 1 refills | Status: DC

## 2021-04-03 NOTE — Telephone Encounter (Signed)
-----   Message from Rosetta Posner sent at 04/03/2021  2:25 PM EDT ----- Regarding: Medication Refills Hi, I need refills sent to [facility] for:  Fenofibrate145 mg 1 tablet at bedtime, Duloxetine60 mg 2 tablets at breakfast Metformin1000 mg 1 tablet at breakfast and1tablet at bedtime Amlodipine5 mg 1 tablet at bedtime Lisinopril5 mg 1 tablet at bedtime Atorvastatin 80 mg 1 tablet atbedtime [place]ion Sr100 mg 1 tablet at breakfast and 1 tablet at bedtime Hctz25 mg 1 tablet at breakfast Gabapentin100 mg 1-3 three times daily as needed(Not in packing)  Thanks.

## 2021-04-03 NOTE — Progress Notes (Addendum)
Chronic Care Management Pharmacy Assistant   Name: Katelyn Cline  MRN: [id number] DOB: 11-25-1960  Reason for Encounter: Medication Review/Medication Coordination Call  Medications: Outpatient Encounter Medications as of 04/03/2021  Medication Sig   amLODipine (NORVASC) 5 MG tablet TAKE 1 TABLET(5 MG) BY MOUTH DAILY   aspirin EC 81 MG tablet Take 81 mg by mouth daily.   atorvastatin (LIPITOR) 80 MG tablet TAKE 1 TABLET BY MOUTH DAILY AT 6 PM   Blood Glucose Monitoring Suppl (BLOOD GLUCOSE SYSTEM PAK) KIT Please dispense based on patient and insurance preference. Use as directed to monitor FSBS 2x daily. Dx: E11.9   buPROPion (WELLBUTRIN SR) 100 MG 12 hr tablet TAKE 1 TABLET(100 MG) BY MOUTH TWICE DAILY   DULoxetine (CYMBALTA) 60 MG capsule TAKE TWO CAPSULES BY MOUTH EVERY MORNING   fenofibrate (TRICOR) 145 MG tablet TAKE ONE TABLET BY MOUTH EVERYDAY AT BEDTIME   fish oil-omega-3 fatty acids 1000 MG capsule Take 2 g by mouth 2 (two) times daily.   gabapentin (NEURONTIN) 100 MG capsule Take 1-3 CAPSULES BY MOUTH THREE times daily AS NEEDED FOR nerve pain   Glucose Blood (BLOOD GLUCOSE TEST STRIPS) STRP Please dispense based on patient and insurance preference. Use as directed to monitor FSBS 2x daily. Dx: E11.9   hydrochlorothiazide (HYDRODIURIL) 25 MG tablet TAKE ONE TABLET BY MOUTH EVERY MORNING   hydroxyurea (HYDREA) 500 MG capsule Take 2 capsules (1,000 mg total) by mouth daily. Take with food to decrease appetite.   lisinopril (ZESTRIL) 5 MG tablet TAKE 1 TABLET (5 MG) BY MOUTH AT BEDTIME   metFORMIN (GLUCOPHAGE) 1000 MG tablet Take 1 tablet (1,000 mg total) by mouth 2 (two) times daily with a meal.   Microlet Lancets MISC Please dispense based on patient and insurance preference. Use as directed to monitor FSBS 2x daily. Dx: E11.9   nystatin (MYCOSTATIN/NYSTOP) powder Apply 1 application topically 3 (three) times daily. PA Case: [id number] Approved 06/30/2019- 06/28/2020   nystatin  cream (MYCOSTATIN) Apply 1 application topically 2 (two) times daily. To affected areas   oxyCODONE-acetaminophen (PERCOCET) 10-325 MG tablet Take 1 tablet by mouth 4 (four) times daily as needed for severe pain.   senna (SENOKOT) 8.6 MG TABS tablet Take 2 tablets by mouth daily as needed for mild constipation.   varenicline (CHANTIX CONTINUING MONTH PAK) 1 MG tablet Take 1 tablet (1 mg total) by mouth 2 (two) times daily.   No facility-administered encounter medications on file as of 04/03/2021.    Reviewed chart for medication changes ahead of medication coordination call.  No OVs, Consults, or hospital visits since last care coordination call.  No medication changes indicated.  BP Readings from Last 3 Encounters:  02/24/21 (!) 153/70  02/19/21 130/88  01/24/21 136/73    Lab Results  Component Value Date   HGBA1C 6.1 (H) 02/19/2021     Patient obtains medications through Adherence Packaging  30 Days   Last adherence delivery included:  Fenofibrate 145 mg 1 tablet at bedtime Duloxetine 60 mg 2 tablets at breakfast Gabapentin 100 mg 1-3 three times daily as needed (Not in packing) Hydroxyurea 500 mg 2 tablets at bedtime  Metformin 1000 mg 1 tablet at breakfast and 1 tablet at bedtime. Amlodipine 5 mg 1 tablet at bedtime Lisinopril 5 mg 1 tablet at bedtime Atorvastatin 80 mg 1 tablet at bedtime Burpopion Sr 100 mg  tablet at breakfast and 1 tablet at bedtime Hctz 25 mg 1 tablet at breakfast Varenicline 1 mg  Patient declined meds last month: None  Patient is due for next adherence delivery on: 04/15/21.  Called patient and reviewed medications and coordinated delivery.  This delivery to include: Fenofibrate 145 mg 1 tablet at bedtime Duloxetine 60 mg 2 tablets at breakfast Gabapentin 100 mg 1-3 three times daily as needed (Not in packing) Hydroxyurea 500 mg 2 tablets at bedtime  Metformin 1000 mg 1 tablet at breakfast and 1 tablet at bedtime. Amlodipine 5 mg 1 tablet  at bedtime Lisinopril 5 mg 1 tablet at bedtime Atorvastatin 80 mg 1 tablet at bedtime Burpopion Sr 100 mg 1 tablet at breakfast and 1 tablet at bedtime Hctz 25 mg 1 tablet at breakfast Varenicline 1 mg 1 tab twice daily Fish Oil-Omega 3 Fatty Acids 1000 mg 2 tablets at breakfast 2 tablets at bedtime. Aspirin 81 mg 1 tablet of 81 mg at breakfast   Patient declined the following medications: None.  Patient needs refills for: Fenofibrate 145 mg 1 tablet at bedtime, Duloxetine 60 mg 2 tablets at breakfast,Metformin 1000 mg 1 tablet at breakfast and 1 tablet at bedtime, Amlodipine 5 mg 1 tablet at bedtime, Lisinopril 5 mg 1 tablet at bedtime, Atorvastatin 80 mg 1 tablet at bedtime, Burpopion Sr 100 mg 1 tablet at breakfast and 1 tablet at bedtime, Hctz 25 mg 1 tablet at breakfast, Gabapentin 100 mg 1-3 three times daily as needed (Not in packing), Fish Oil-Omega 3 Fatty Acids 1000 mg 2 tablets at breakfast 2 tablets at bedtime, Aspirin 81 mg 1 tablet of 81 mg at breakfast   Confirmed delivery date of 04/15/21, advised patient that pharmacy will contact them the morning of delivery.  Follow-Up:Pharmacist Review  [name], RMA Clinical Pharmacist Assistant [phone]  10 minutes spent in review, coordination, and documentation.  Reviewed by: Beverly Milch, PharmD Clinical Pharmacist [phone]

## 2021-04-03 NOTE — Telephone Encounter (Signed)
-----   Message from [place] sent at 04/03/2021  3:37 PM EDT ----- Regarding: Medication Refill Hi can you send a refill to [facility] for: Fish Oil-Omega 3 Fatty Acids 1000 mg 2 tablets at breakfast 2 tablets at bedtime. Aspirin 81 mg 1 tablet of 81 mg at breakfast   Thanks.

## 2021-04-24 ENCOUNTER — Telehealth: Payer: Self-pay | Admitting: Hematology

## 2021-04-24 NOTE — Telephone Encounter (Signed)
Cancelled per 10/27 pt request, pt said she will call to reschedule

## 2021-04-25 ENCOUNTER — Inpatient Hospital Stay: Payer: Medicare HMO

## 2021-04-25 ENCOUNTER — Inpatient Hospital Stay: Payer: Medicare HMO | Admitting: Hematology

## 2021-05-27 ENCOUNTER — Inpatient Hospital Stay: Payer: Medicare HMO

## 2021-05-27 ENCOUNTER — Inpatient Hospital Stay: Payer: Medicare HMO | Attending: Hematology

## 2021-05-28 ENCOUNTER — Ambulatory Visit: Payer: Medicare HMO | Admitting: Nurse Practitioner

## 2021-05-29 ENCOUNTER — Telehealth: Payer: Self-pay | Admitting: Hematology

## 2021-05-29 NOTE — Telephone Encounter (Signed)
Scheduled per sch msg. Called and left msg  

## 2021-05-30 ENCOUNTER — Telehealth: Payer: Self-pay | Admitting: Pharmacist

## 2021-05-30 NOTE — Progress Notes (Addendum)
Chronic Care Management Pharmacy Assistant   Name: Katelyn Cline  MRN: [id number] DOB: [birth year]   Reason for Encounter: Monthly Medication Coordination Call     Medications: Outpatient Encounter Medications as of 05/30/2021  Medication Sig   amLODipine (NORVASC) 5 MG tablet TAKE 1 TABLET(5 MG) BY MOUTH DAILY   aspirin EC 81 MG tablet Take 1 tablet (81 mg total) by mouth daily.   atorvastatin (LIPITOR) 80 MG tablet TAKE 1 TABLET BY MOUTH DAILY AT 6 PM   Blood Glucose Monitoring Suppl (BLOOD GLUCOSE SYSTEM PAK) KIT Please dispense based on patient and insurance preference. Use as directed to monitor FSBS 2x daily. Dx: E11.9   buPROPion ER (WELLBUTRIN SR) 100 MG 12 hr tablet TAKE 1 TABLET(100 MG) BY MOUTH TWICE DAILY   DULoxetine (CYMBALTA) 60 MG capsule TAKE TWO CAPSULES BY MOUTH EVERY MORNING   fenofibrate (TRICOR) 145 MG tablet TAKE ONE TABLET BY MOUTH EVERYDAY AT BEDTIME   fish oil-omega-3 fatty acids 1000 MG capsule Take 2 capsules (2 g total) by mouth 2 (two) times daily.   gabapentin (NEURONTIN) 100 MG capsule Take 1-3 CAPSULES BY MOUTH THREE times daily AS NEEDED FOR nerve pain   Glucose Blood (BLOOD GLUCOSE TEST STRIPS) STRP Please dispense based on patient and insurance preference. Use as directed to monitor FSBS 2x daily. Dx: E11.9   hydrochlorothiazide (HYDRODIURIL) 25 MG tablet TAKE ONE TABLET BY MOUTH EVERY MORNING   hydroxyurea (HYDREA) 500 MG capsule Take 2 capsules (1,000 mg total) by mouth daily. Take with food to decrease appetite.   lisinopril (ZESTRIL) 5 MG tablet TAKE 1 TABLET (5 MG) BY MOUTH AT BEDTIME   metFORMIN (GLUCOPHAGE) 1000 MG tablet Take 1 tablet (1,000 mg total) by mouth 2 (two) times daily with a meal.   Microlet Lancets MISC Please dispense based on patient and insurance preference. Use as directed to monitor FSBS 2x daily. Dx: E11.9   nystatin (MYCOSTATIN/NYSTOP) powder Apply 1 application topically 3 (three) times daily. PA Case: [id number] Approved  06/30/2019- 06/28/2020   nystatin cream (MYCOSTATIN) Apply 1 application topically 2 (two) times daily. To affected areas   oxyCODONE-acetaminophen (PERCOCET) 10-325 MG tablet Take 1 tablet by mouth 4 (four) times daily as needed for severe pain.   senna (SENOKOT) 8.6 MG TABS tablet Take 2 tablets by mouth daily as needed for mild constipation.   varenicline (CHANTIX CONTINUING MONTH PAK) 1 MG tablet Take 1 tablet (1 mg total) by mouth 2 (two) times daily.   No facility-administered encounter medications on file as of 05/30/2021.   Reviewed chart for medication changes ahead of medication coordination call.  No OVs, Consults, or hospital visits since last care coordination call/Pharmacist visit. (If appropriate, list visit date, provider name)    No medication changes indicated OR if recent visit, treatment plan here.  BP Readings from Last 3 Encounters:  02/24/21 (!) 153/70  02/19/21 130/88  01/24/21 136/73    Lab Results  Component Value Date   HGBA1C 6.1 (H) 02/19/2021     Patient obtains medications through Adherence Packaging  30 Days   Last adherence delivery included: (medication name and frequency) Fenofibrate 145 mg 1 tablet at bedtime Duloxetine 60 mg 2 tablets at breakfast Gabapentin 100 mg 1-3 three times daily as needed (Not in packing) Hydroxyurea 500 mg 2 tablets at bedtime  Metformin 1000 mg 1 tablet at breakfast and 1 tablet at bedtime. Amlodipine 5 mg 1 tablet at bedtime Lisinopril 5 mg 1 tablet at bedtime Atorvastatin  80 mg 1 tablet at bedtime Burpopion Sr 100 mg 1 tablet at breakfast and 1 tablet at bedtime Hctz 25 mg 1 tablet at breakfast Varenicline 1 mg 1 tab twice daily Fish Oil-Omega 3 Fatty Acids 1000 mg 2 tablets at breakfast 2 tablets at bedtime. Aspirin 81 mg 1 tablet of 81 mg at breakfast    Patient declined (meds) last month due to PRN use/additional supply on hand. Explanation of abundance on hand (ie #30 due to overlapping fills or previous  adherence issues etc)  Patient is due for next adherence delivery on: 06/12/21. Called patient and reviewed medications and coordinated delivery.  This delivery to include:  Fenofibrate 145 mg 1 tablet at bedtime Duloxetine 60 mg 2 tablets at breakfast Gabapentin 100 mg 1-3 three times daily as needed (Not in packing) Hydroxyurea 500 mg 2 tablets at bedtime  Metformin 1000 mg 1 tablet at breakfast and 1 tablet at bedtime. Amlodipine 5 mg 1 tablet at bedtime Lisinopril 5 mg 1 tablet at bedtime Atorvastatin 80 mg 1 tablet at bedtime Burpopion Sr 100 mg 1 tablet at breakfast and 1 tablet at bedtime Hctz 25 mg 1 tablet at breakfast Varenicline 1 mg 1 tab twice daily Fish Oil-Omega 3 Fatty Acids 1000 mg 2 tablets at breakfast 2 tablets at bedtime. Aspirin 81 mg 1 tablet of 81 mg at breakfast  Patient will need a short fill of (med), prior to adherence delivery. (To align with sync date or if PRN med)   Patient needs refills for Hydroxyurea and Gabapentin 300 mg three times daily for 30 day supply  Confirmed delivery date of 06/12/21, advised patient that pharmacy will contact them the morning of delivery.   Care Gaps  AWV: unknown  Colonoscopy: done 04/20/17 DM Eye Exam: overdue 09/07/18 DM Foot Exam:  due 02/19/22 Microalbumin: done 12/04/20 HbgAIC: done 02/19/21 (6.1) DEXA: unknown Mammogram:03/31/2017  Star Rating Drugs: metFORMIN (GLUCOPHAGE) 1000 MG tablet - [facility] now  lisinopril (ZESTRIL) 5 MG tablet - [facility] now  atorvastatin (LIPITOR) 80 MG tablet - using [facility] now    Future Appointments  Date Time Provider [place]  06/05/2021  3:00 PM [facility] PHARMACIST [facility] None  06/10/2021  1:00 PM [facility] LAB [facility] None  06/10/2021  1:40 PM [name], MD [facility] None  06/10/2021  2:30 PM [facility] INFUSION [facility] None    Jobe Gibbon, CCMA Clinical Pharmacist Assistant  [phone]  10 minutes spent in review, coordination, and documentation.  Reviewed by: Beverly Milch, PharmD Clinical Pharmacist [phone]

## 2021-06-03 NOTE — Progress Notes (Signed)
Chronic Care Management Pharmacy Note  06/05/2021 Name:  Katelyn Cline MRN:  [id number] DOB:  07-Feb-1961  Summary: FU visit.  Discussed dietary changes to help lipids including limiting TV dinner increasing fresh lean meats/veggies.  We discussed addition of Ozempic to her DM regimen - this could replace metformin and give her added weight loss benefit.  Recommended patient reschedule missed visit with PCP to address gaps in care as well as update labs (A1c, TSH, lipids).  Subjective: Katelyn Cline is an 60 y.o. year old female who is a primary patient of Eulogio Bear, NP.  The CCM team was consulted for assistance with disease management and care coordination needs.    Engaged with patient face to face for follow up visit in response to provider referral for pharmacy case management and/or care coordination services.   Consent to Services:  The patient was given the following information about Chronic Care Management services today, agreed to services, and gave verbal consent: 1. CCM service includes personalized support from designated clinical staff supervised by the primary care provider, including individualized plan of care and coordination with other care providers 2. 24/7 contact phone numbers for assistance for urgent and routine care needs. 3. Service will only be billed when office clinical staff spend 20 minutes or more in a month to coordinate care. 4. Only one practitioner may furnish and bill the service in a calendar month. 5.The patient may stop CCM services at any time (effective at the end of the month) by phone call to the office staff. 6. The patient will be responsible for cost sharing (co-pay) of up to 20% of the service fee (after annual deductible is met). Patient agreed to services and consent obtained.  Patient Care Team: Eulogio Bear, NP as PCP - General (Nurse Practitioner) [name], MD as Consulting Physician (Hematology) [name], [facility] as Pharmacist (Pharmacist)  Recent office visits: 06/18/20 [facility]) - AWV, unable to take Jardiance due to possible cause of polycythemia.  Referral to me for help with pill packing.  Recent consult visits: None recent  Hospital visits: None in previous 6 months  Objective:  Lab Results  Component Value Date   CREATININE 0.72 02/24/2021   BUN 13 02/24/2021   GFRNONAA >60 02/24/2021   GFRAA >60 01/12/2020   NA 139 02/24/2021   K 3.9 02/24/2021   CALCIUM 9.7 02/24/2021   CO2 27 02/24/2021   GLUCOSE 203 (H) 02/24/2021    Lab Results  Component Value Date/Time   HGBA1C 6.1 (H) 02/19/2021 11:52 AM   HGBA1C 7.1 (H) 06/18/2020 11:22 AM   MICROALBUR 9.3 06/18/2020 10:49 AM   MICROALBUR 2.6 10/07/2016 09:22 AM    Last diabetic Eye exam:  Lab Results  Component Value Date/Time   HMDIABEYEEXA No Retinopathy 09/06/2017 12:00 AM    Last diabetic Foot exam: No results found for: HMDIABFOOTEX   Lab Results  Component Value Date   CHOL 143 02/19/2021   HDL 35 (L) 02/19/2021   LDLCALC 78 02/19/2021   LDLDIRECT 60 06/16/2019   TRIG 208 (H) 02/19/2021   CHOLHDL 4.1 02/19/2021    Hepatic Function Latest Ref Rng & Units 02/24/2021 01/24/2021 12/25/2020  Total Protein 6.5 - 8.1 g/dL 7.2 7.6 7.1  Albumin 3.5 - 5.0 g/dL 3.3(L) 3.6 3.3(L)  AST 15 - 41 U/L 11(L) 10(L) 11(L)  ALT 0 - 44 U/L _0 Alk Phosphatase 38 - 126 U/L 99 110 126  Total Bilirubin  0.3 - 1.2 mg/dL 0.3 0.5 0.3    Lab Results  Component Value Date/Time   TSH 1.98 09/17/2015 08:52 AM   TSH 4.922 (H) 05/28/2015 08:58 AM   FREET4 1.2 09/17/2015 08:52 AM   FREET4 1.00 05/28/2015 08:58 AM    CBC Latest Ref Rng & Units 02/24/2021 01/24/2021 12/25/2020  WBC 4.0 - 10.5 K/uL 11.8(H) 11.2(H) 14.7(H)  Hemoglobin 12.0 - 15.0 g/dL 13.7 15.1(H) 14.2  Hematocrit 36.0 - 46.0 % 45.5 50.0(H) 47.9(H)  Platelets 150 - 400 K/uL 394 209 453(H)    Lab Results  Component Value Date/Time   VD25OH 28 (L)  12/02/2012 10:33 AM   VD25OH 15 (L) 10/05/2012 10:04 AM    Clinical ASCVD: Yes  The 10-year ASCVD risk score (Arnett DK, et al., 2019) is: 27.7%   Values used to calculate the score:     Age: 60 years     Sex: Female     Is Non-Hispanic African American: No     Diabetic: Yes     Tobacco smoker: Yes     Systolic Blood Pressure: 863 mmHg     Is BP treated: Yes     HDL Cholesterol: 35 mg/dL     Total Cholesterol: 143 mg/dL    Depression screen [facility] 2/9 02/19/2021 06/18/2020 10/16/2019  Decreased Interest _0 Down, Depressed, Hopeless _1 PHQ - 2 Score _2 Altered sleeping _3 Tired, decreased energy _4 Change in appetite 3 0 0  Feeling bad or failure about yourself  _5 Trouble concentrating _6 Moving slowly or fidgety/restless 3 0 0  Suicidal thoughts 0 0 0  PHQ-9 Score _7 Difficult doing work/chores Very difficult Somewhat difficult Somewhat difficult  Some recent data might be hidden       Social History   Tobacco Use  Smoking Status Every Day   Packs/day: 0.50   Years: 50.00   Pack years: 25.00   Types: Cigarettes  Smokeless Tobacco Never   BP Readings from Last 3 Encounters:  02/24/21 (!) 153/70  02/19/21 130/88  01/24/21 136/73   Pulse Readings from Last 3 Encounters:  02/24/21 75  02/19/21 78  01/24/21 73   Wt Readings from Last 3 Encounters:  02/19/21 227 lb 9.6 oz (103.2 kg)  01/24/21 231 lb 1.6 oz (104.8 kg)  10/25/20 236 lb 4.8 oz (107.2 kg)   BMI Readings from Last 3 Encounters:  02/19/21 34.61 kg/m  01/24/21 35.14 kg/m  10/25/20 35.93 kg/m    Assessment/Interventions: Review of patient past medical history, allergies, medications, health status, including review of consultants reports, laboratory and other test data, was performed as part of comprehensive evaluation and provision of chronic care management services.   SDOH:  (Social Determinants of Health) assessments and interventions performed: Yes    Financial Resource Strain: Not on file    SDOH Screenings   Alcohol Screen: Low Risk    Last Alcohol Screening Score (AUDIT): 0  Depression (PHQ2-9): Medium Risk   PHQ-2 Score: 20  Financial Resource Strain: Not on file  Food Insecurity: Not on file  Housing: Not on file  Physical Activity: Not on file  Social Connections: Not on file  Stress: Not on file  Tobacco Use: High Risk   Smoking Tobacco Use: Every Day   Smokeless Tobacco Use: Never   Passive Exposure: Not on file  Transportation Needs: Not on file  CCM Care Plan  Allergies  Allergen Reactions   Contrast Media [Iodinated Diagnostic Agents] Anaphylaxis   Erythromycin     Stomach pain   Flagyl [Metronidazole] Other (See Comments)    Generalized pain.   Latex Other (See Comments)    Was told to be careful because of Dye allergy   Other     Iodine Dye   Penicillins Other (See Comments)    Patient was an infant, no idea of reaction. Tolerates Keflex. Did it involve swelling of the face/tongue/throat, SOB, or low BP? Unknown Did it involve sudden or severe rash/hives, skin peeling, or any reaction on the inside of your mouth or nose? Unknown Did you need to seek medical attention at a hospital or doctor's office? Unknown When did it last happen?      infant If all above answers are "NO", may proceed with cephalosporin use.    Tetracyclines & Related Other (See Comments)    GI side effects    Medications Reviewed Today     Reviewed by [name], [facility] (Pharmacist) on 06/05/21 at [place] List Status: <None>   Medication Order Taking? Sig Documenting Provider Last Dose Status Informant  amLODipine (NORVASC) 5 MG tablet [id number] Yes TAKE 1 TABLET(5 MG) BY MOUTH DAILY Eulogio Bear, NP Taking Active   aspirin EC 81 MG tablet [id number] Yes Take 1 tablet (81 mg total) by mouth daily. Eulogio Bear, NP Taking Active   atorvastatin (LIPITOR) 80 MG tablet [id number] Yes TAKE 1 TABLET BY MOUTH  DAILY AT 6 PM Noemi Chapel A, NP Taking Active   Blood Glucose Monitoring Suppl (BLOOD GLUCOSE SYSTEM PAK) KIT [id number]  Please dispense based on patient and insurance preference. Use as directed to monitor FSBS 2x daily. Dx: E11.9 [name], MD  Active   buPROPion ER [facility] SR) 100 MG 12 hr tablet [id number]  TAKE 1 TABLET(100 MG) BY MOUTH TWICE DAILY Eulogio Bear, NP  Active   DULoxetine (CYMBALTA) 60 MG capsule [id number]  TAKE TWO CAPSULES BY MOUTH EVERY MORNING Eulogio Bear, NP  Active   fenofibrate (TRICOR) 145 MG tablet [id number]  TAKE ONE TABLET BY MOUTH EVERYDAY AT BEDTIME Noemi Chapel A, NP  Active   fish oil-omega-3 fatty acids 1000 MG capsule [id number] Yes Take 2 capsules (2 g total) by mouth 2 (two) times daily. Eulogio Bear, NP Taking Active   gabapentin (NEURONTIN) 100 MG capsule [id number]  take 1-3 capsules BY MOUTH three times daily AS NEEDED FOR nerve pain Noemi Chapel A, NP  Active   Glucose Blood (BLOOD GLUCOSE TEST STRIPS) STRP [id number]  Please dispense based on patient and insurance preference. Use as directed to monitor FSBS 2x daily. Dx: E11.9 [name], MD  Active   hydrochlorothiazide (HYDRODIURIL) 25 MG tablet [id number] Yes TAKE ONE TABLET BY MOUTH EVERY MORNING Eulogio Bear, NP Taking Active   hydroxyurea (HYDREA) 500 MG capsule [id number]  TAKE TWO CAPSULES BY MOUTH EVERYDAY AT BEDTIME [name], MD  Active   lisinopril (ZESTRIL) 5 MG tablet [id number]  TAKE 1 TABLET (5 MG) BY MOUTH AT BEDTIME Noemi Chapel A, NP  Active   metFORMIN (GLUCOPHAGE) 1000 MG tablet [id number] Yes Take 1 tablet (1,000 mg total) by mouth 2 (two) times daily with a meal. Eulogio Bear, NP Taking Active   Microlet Lancets MISC [id number]  Please dispense based on patient and insurance preference. Use as directed to monitor FSBS 2x daily. Dx: E11.9  Buelah Manis, Modena Nunnery, MD  Active   nystatin (MYCOSTATIN/NYSTOP) powder  [id number]  Apply 1 application topically 3 (three) times daily. PA Case: [id number] Approved 06/30/2019- 06/28/2020 [name], MD  Active   nystatin cream Royden Purl) [id number]  Apply 1 application topically 2 (two) times daily. To affected areas [place], Modena Nunnery, MD  Active   oxyCODONE-acetaminophen (PERCOCET) 10-325 MG tablet [id number]  Take 1 tablet by mouth 4 (four) times daily as needed for severe pain. [place], Modena Nunnery, MD  Active Self  senna (SENOKOT) 8.6 MG TABS tablet [id number]  Take 2 tablets by mouth daily as needed for mild constipation. [provider]  Active   varenicline (CHANTIX CONTINUING MONTH PAK) 1 MG tablet [id number]  Take 1 tablet (1 mg total) by mouth 2 (two) times daily. Eulogio Bear, NP  Active             Patient Active Problem List   Diagnosis Date Noted   MPN (myeloproliferative neoplasm) ([place])    Leukocytosis 03/12/2018   Dental caries 03/12/2018   Aortic atherosclerosis ([place]) 02/21/2018   Constipation 05/28/2015   OE (otitis externa) 01/04/2015   Seborrheic keratoses 08/24/2014   PVC (premature ventricular contraction) 05/18/2014   Type 2 diabetes mellitus with diabetic neuropathy ([place]) 02/06/2013   Chronic hypoxemic respiratory failure ([place]) 02/06/2013   History of basal cell carcinoma 12/01/2012   Posttraumatic stress disorder 12/01/2012   History of malignant melanoma 10/18/2012   Polycythemia vera ([place]) 08/05/2012   Hyperlipidemia 04/09/2012   Narcolepsy 04/09/2012   Peripheral neuropathy 04/09/2012   Tobacco use disorder 04/09/2012   Obesity 04/09/2012   Essential hypertension, benign 04/09/2012   DDD (degenerative disc disease), lumbar 02/16/2012   DDD (degenerative disc disease), cervical 02/16/2012   Chronic pain disorder 02/16/2012   Depression 02/16/2012   Fibromyalgia 02/16/2012    Immunization History  Administered Date(s) Administered   Hepatitis B, ped/adol 06/29/2001   Influenza,inj,Quad PF,6+ Mos  04/07/2013, 05/18/2014, 05/28/2015, 05/13/2016, 06/11/2017, 07/18/2018, 06/16/2019, 06/18/2020   Moderna Sars-Covid-2 Vaccination 11/20/2019, 12/19/2019   Pneumococcal Polysaccharide-23 12/10/2011, 10/25/2013   Td 06/29/2001   Tdap 12/10/2011    Conditions to be addressed/monitored: Type II DM HTN, PVC, HLD, Depression, Chronic Pain,   Care Plan : General Pharmacy (Adult)  Updates made by [name], RPH since 06/05/2021 12:00 AM     Problem: Type II DM, HTN, HLD, Depression, Chronic Pain   Priority: High  Onset Date: 10/17/2020     Long-Range Goal: Patient-Specific Goal   Start Date: 10/17/2020  Expected End Date: 04/19/2021  Recent Progress: On track  Priority: High  Note:   Current Barriers:  Unable to independently monitor therapeutic efficacy Unable to achieve control of BP, Glucose  Unable to self administer medications as prescribed Does not adhere to prescribed medication regimen  Pharmacist Clinical Goal(s):  Patient will achieve adherence to monitoring guidelines and medication adherence to achieve therapeutic efficacy achieve control of BP and glucose as evidenced by home monitoring/A1c adhere to plan to optimize therapeutic regimen for BP/DM as evidenced by report of adherence to recommended medication management changes adhere to prescribed medication regimen as evidenced by fill date and pill pack usage through collaboration with PharmD and provider.    Interventions: 1:1 collaboration with Buelah Manis, Modena Nunnery, MD regarding development and update of comprehensive plan of care as evidenced by provider attestation and co-signature Inter-disciplinary care team collaboration (see longitudinal plan of care) Comprehensive medication review performed; medication list updated in electronic medical record  Hypertension (  BP goal <130/80) -Controlled -Current treatment: Amlodipine 23m daily HCTZ 234mdaily Lisinopril 68m68maily -Medications previously tried:  Maxzide  -Current home readings: not available, patient not checking -Current dietary habits: she is in the process of improving her diet, she just got new teeth and is not able to eat mor fresh veggies, etc. As opposed to being restricted to softer foods -Current exercise habits: minmal -Denies hypotensive/hypertensive symptoms -Educated on BP goals and benefits of medications for prevention of heart attack, stroke and kidney damage; Daily salt intake goal < 2300 mg; Exercise goal of 150 minutes per week; Importance of home blood pressure monitoring; -Counseled to monitor BP at home a few times per week, document, and provide log at future appointments -Recommended to continue current medication Recommended monitor BP at home and contact providers if consistently >130/80.  Adherence is also an issue, patient has opted for pill packaging to help with this.  Update 02/06/21 Not checking BP at home. Denies any dizziness or HA's Adherence is improved with pill packs Still working on lifestyle mods Continue current meds - BP has been fairly controlled in office.  Update 06/05/21 Still not checking BP at home Denies any symptoms, last BP in office was elevated. Had visit scheduled with PCP late November but thought it was a phone visit. She reports eating some TV dinners which have high sodium content. We discussed dietary lifestyle changes such as fresh fish bakes and veggies. Try to limit TV dinners to once every week or two.  Patient agreeable to plan, recommended she reschedule missed visit with PCP to assess BP and other labs needed. Future consideration of home BP cuff via insurance pending coverage.   Hyperlipidemia: (LDL goal < 100, TG < 150) -Not ideally controlled -Current treatment: Atorvastatin 89m768mnofibrate 1468mg51mly Fish Oil 1000mg 37mbs daily -Medications previously tried: none noted  -Current dietary patterns: see above -Current exercise habits: see  above -Educated on Cholesterol goals;  Benefits of statin for ASCVD risk reduction; Importance of limiting foods high in cholesterol;  -Reviewed lipid panel, plans to continue positive diet changes -Recommend repeat lipid panel in June -Recommended to continue current medication  Update 06/05/21 Continues same medications. We discussed dietary changes that would benefit her cholesterol. Her cholesterol has improved since starting pill packs to aid in adherence, she reports only a few missed doses. Still above goal < 70. Could consider addition of zetia in the future if LDL remains elevated. For now, focus on lifestyle changes.  Diabetes (A1c goal <7%) -Not ideally controlled -Current medications: Metformin 1000mg B43mMedications previously tried: Jardiance, Januvia -Current home glucose readings fasting glucose: 150s post prandial glucose: 180-200 -Denies hypoglycemic/hyperglycemic symptoms -Current meal patterns: working on improving diet, previously consumed high carbohydrate diet because these were the easiest to eat, now she has new teeth and plans to increase fresh vegetables and decrease her carbs.   -Current exercise: minimal -Educated on A1c and blood sugar goals; Benefits of weight loss; Prevention and management of hypoglycemic episodes; Benefits of routine self-monitoring of blood sugar; -Counseled to check feet daily and get yearly eye exams -Reports her last A1c she was going through a period of depression and did not take meds or care about her diet.  Has new positive outlook now. -Counseled on diet and exercise extensively Recommended to continue current medication  Update 02/06/21 Adherence is improved with pill packs!! She is not really checking her sugar too often but reports around 150 when she does check. Denies any  hypoglycemia episodes. Recommend recheck A1c ASAP. Continue current meds  Update 06/05/21 150 fasting when she checked most recently.  She  does not check routinely. Denies any low blood sugars. I believe she would benefit from addition of Ozempic for added weight loss benefit. I would recommend we recheck her A1c when she comes in for rescheduled visit with PCP. Will initiate process for Ozempic patient assistance so cost is not a barrier for her.  She is only on SSI so qualification should not be a concern. Patient also due for diabetic foot/eye exam.  Will collaborate with PCP to address these at upcoming visit.  Depression/Anxiety (Goal: Minimize symptoms) -Controlled -Current treatment: Bupropion SR 125m q12h Duloxetine 61m2 capsules daily -Educated on Benefits of medication for symptom control  -Reports her mood is tremendously improved -Adherent with both medications -Recommended to continue current medication  Update 06/05/21 Continues same medications as before. Reports that "it is the holidays."  Always a little more down during this time. Denies any suicidal ideation. Offered support if she needed it. No changes to meds needed at this time.   Chronic Pain/Fibromyalgia (Goal: Improve QOL, decrease pain symptoms) -Controlled -Current treatment  Oxycodone 7.5-325mg QID prn sever pain Gabapentin 10060m to 3 capsules po TID -Medications previously tried: none noted -Reports pain is somewhat controlled, she does not like to take medications for it but has to -Only takes gabapentin if really needs it -Has pain contract for this medication must get through [facility] -Recommended to continue current medication   Patient Goals/Self-Care Activities Patient will:  - take medications as prescribed check blood pressure daily, document, and provide at future appointments target a minimum of 150 minutes of moderate intensity exercise weekly engage in dietary modifications by decreasing carbs and increasing fresh vegetables, limiting the amount of TV dinners Reschedule missed PCP appointment  Follow Up Plan:  The care management team will reach out to the patient again over the next 90 days.             Medication Assistance: None required.  Patient affirms current coverage meets needs.  [facility]  [id number] [facility] [id number] [id number]: [phone]: [phone] [id number] [id number]: [phone]: [phone] pill box? No - patient does not have organization method Pt endorses 85% compliance  We discussed: Benefits of medication synchronization, packaging and delivery as well as enhanced pharmacist oversight with [facility]. Patient decided to: Utilize [facility] for medication synchronization, packaging and delivery  Verbal consent obtained for [facility] enhanced pharmacy services (medication synchronization, adherence packaging, delivery coordination). A medication sync plan was created to allow patient to get all medications delivered once every 30 to 90 days per patient preference. Patient understands they have freedom to choose pharmacy and clinical pharmacist will coordinate care between all prescribers and [facility].   Care Plan and Follow Up Patient Decision:  Patient agrees to Care Plan and Follow-up.  Plan: The care management team will reach out to the patient again over the next 90 days.  ChrBeverly MilchharmD Clinical Pharmacist [phone]

## 2021-06-05 ENCOUNTER — Ambulatory Visit: Payer: Medicare HMO | Admitting: Pharmacist

## 2021-06-05 ENCOUNTER — Other Ambulatory Visit: Payer: Self-pay | Admitting: Nurse Practitioner

## 2021-06-05 ENCOUNTER — Other Ambulatory Visit: Payer: Self-pay | Admitting: Hematology

## 2021-06-05 DIAGNOSIS — D45 Polycythemia vera: Secondary | ICD-10-CM

## 2021-06-05 DIAGNOSIS — I10 Essential (primary) hypertension: Secondary | ICD-10-CM

## 2021-06-05 DIAGNOSIS — E119 Type 2 diabetes mellitus without complications: Secondary | ICD-10-CM

## 2021-06-05 DIAGNOSIS — F32A Depression, unspecified: Secondary | ICD-10-CM

## 2021-06-05 DIAGNOSIS — D471 Chronic myeloproliferative disease: Secondary | ICD-10-CM

## 2021-06-05 DIAGNOSIS — E782 Mixed hyperlipidemia: Secondary | ICD-10-CM

## 2021-06-05 NOTE — Patient Instructions (Addendum)
Visit Information   Goals Addressed             This Visit's Progress    Track and Manage My Blood Pressure-Hypertension       Timeframe:  Long-Range Goal Priority:  High Start Date: 06/05/21                            Expected End Date:  12/04/21                     Follow Up Date 09/03/21    - check blood pressure weekly - choose a place to take my blood pressure (home, clinic or office, retail store) - write blood pressure results in a log or diary    Why is this important?   You won't feel high blood pressure, but it can still hurt your blood vessels.  High blood pressure can cause heart or kidney problems. It can also cause a stroke.  Making lifestyle changes like losing a little weight or eating less salt will help.  Checking your blood pressure at home and at different times of the day can help to control blood pressure.  If the doctor prescribes medicine remember to take it the way the doctor ordered.  Call the office if you cannot afford the medicine or if there are questions about it.     Notes:        Patient Care Plan: General Pharmacy (Adult)     Problem Identified: Type II DM, HTN, HLD, Depression, Chronic Pain   Priority: High  Onset Date: 10/17/2020     Long-Range Goal: Patient-Specific Goal   Start Date: 10/17/2020  Expected End Date: 04/19/2021  Recent Progress: On track  Priority: High  Note:   Current Barriers:  Unable to independently monitor therapeutic efficacy Unable to achieve control of BP, Glucose  Unable to self administer medications as prescribed Does not adhere to prescribed medication regimen  Pharmacist Clinical Goal(s):  Patient will achieve adherence to monitoring guidelines and medication adherence to achieve therapeutic efficacy achieve control of BP and glucose as evidenced by home monitoring/A1c adhere to plan to optimize therapeutic regimen for BP/DM as evidenced by report of adherence to recommended medication management  changes adhere to prescribed medication regimen as evidenced by fill date and pill pack usage through collaboration with PharmD and provider.    Interventions: 1:1 collaboration with Buelah Manis, Modena Nunnery, MD regarding development and update of comprehensive plan of care as evidenced by provider attestation and co-signature Inter-disciplinary care team collaboration (see longitudinal plan of care) Comprehensive medication review performed; medication list updated in electronic medical record  Hypertension (BP goal <130/80) -Controlled -Current treatment: Amlodipine 5mg  daily HCTZ 25mg  daily Lisinopril 5mg  daily -Medications previously tried: Maxzide  -Current home readings: not available, patient not checking -Current dietary habits: she is in the process of improving her diet, she just got new teeth and is not able to eat mor fresh veggies, etc. As opposed to being restricted to softer foods -Current exercise habits: minmal -Denies hypotensive/hypertensive symptoms -Educated on BP goals and benefits of medications for prevention of heart attack, stroke and kidney damage; Daily salt intake goal < 2300 mg; Exercise goal of 150 minutes per week; Importance of home blood pressure monitoring; -Counseled to monitor BP at home a few times per week, document, and provide log at future appointments -Recommended to continue current medication Recommended monitor BP at home and contact providers  if consistently >130/80.  Adherence is also an issue, patient has opted for pill packaging to help with this.  Update 02/06/21 Not checking BP at home. Denies any dizziness or HA's Adherence is improved with pill packs Still working on lifestyle mods Continue current meds - BP has been fairly controlled in office.  Update 06/05/21 Still not checking BP at home Denies any symptoms, last BP in office was elevated. Had visit scheduled with PCP late November but thought it was a phone visit. She reports  eating some TV dinners which have high sodium content. We discussed dietary lifestyle changes such as fresh fish bakes and veggies. Try to limit TV dinners to once every week or two.  Patient agreeable to plan, recommended she reschedule missed visit with PCP to assess BP and other labs needed. Future consideration of home BP cuff via insurance pending coverage.   Hyperlipidemia: (LDL goal < 100, TG < 150) -Not ideally controlled -Current treatment: Atorvastatin 80mg  Fenofibrate 145mg  daily Fish Oil 1000mg  2 tabs daily -Medications previously tried: none noted  -Current dietary patterns: see above -Current exercise habits: see above -Educated on Cholesterol goals;  Benefits of statin for ASCVD risk reduction; Importance of limiting foods high in cholesterol;  -Reviewed lipid panel, plans to continue positive diet changes -Recommend repeat lipid panel in June -Recommended to continue current medication  Update 06/05/21 Continues same medications. We discussed dietary changes that would benefit her cholesterol. Her cholesterol has improved since starting pill packs to aid in adherence, she reports only a few missed doses. Still above goal < 70. Could consider addition of zetia in the future if LDL remains elevated. For now, focus on lifestyle changes.  Diabetes (A1c goal <7%) -Not ideally controlled -Current medications: Metformin 1000mg  BID -Medications previously tried: Jardiance, Januvia -Current home glucose readings fasting glucose: 150s post prandial glucose: 180-200 -Denies hypoglycemic/hyperglycemic symptoms -Current meal patterns: working on improving diet, previously consumed high carbohydrate diet because these were the easiest to eat, now she has new teeth and plans to increase fresh vegetables and decrease her carbs.   -Current exercise: minimal -Educated on A1c and blood sugar goals; Benefits of weight loss; Prevention and management of hypoglycemic  episodes; Benefits of routine self-monitoring of blood sugar; -Counseled to check feet daily and get yearly eye exams -Reports her last A1c she was going through a period of depression and did not take meds or care about her diet.  Has new positive outlook now. -Counseled on diet and exercise extensively Recommended to continue current medication  Update 02/06/21 Adherence is improved with pill packs!! She is not really checking her sugar too often but reports around 150 when she does check. Denies any hypoglycemia episodes. Recommend recheck A1c ASAP. Continue current meds  Update 06/05/21 150 fasting when she checked most recently.  She does not check routinely. Denies any low blood sugars. I believe she would benefit from addition of Ozempic for added weight loss benefit. I would recommend we recheck her A1c when she comes in for rescheduled visit with PCP. Will initiate process for Ozempic patient assistance so cost is not a barrier for her.  She is only on SSI so qualification should not be a concern. Patient also due for diabetic foot/eye exam.  Will collaborate with PCP to address these at upcoming visit.  Depression/Anxiety (Goal: Minimize symptoms) -Controlled -Current treatment: Bupropion SR 100mg  q12h Duloxetine 60mg  2 capsules daily -Educated on Benefits of medication for symptom control  -Reports her mood is tremendously improved -  Adherent with both medications -Recommended to continue current medication  Update 06/05/21 Continues same medications as before. Reports that "it is the holidays."  Always a little more down during this time. Denies any suicidal ideation. Offered support if she needed it. No changes to meds needed at this time.   Chronic Pain/Fibromyalgia (Goal: Improve QOL, decrease pain symptoms) -Controlled -Current treatment  Oxycodone 7.5-325mg  QID prn sever pain Gabapentin 100mg  1 to 3 capsules po TID -Medications previously tried: none  noted -Reports pain is somewhat controlled, she does not like to take medications for it but has to -Only takes gabapentin if really needs it -Has pain contract for this medication must get through [facility] -Recommended to continue current medication   Patient Goals/Self-Care Activities Patient will:  - take medications as prescribed check blood pressure daily, document, and provide at future appointments target a minimum of 150 minutes of moderate intensity exercise weekly engage in dietary modifications by decreasing carbs and increasing fresh vegetables, limiting the amount of TV dinners Reschedule missed PCP appointment  Follow Up Plan: The care management team will reach out to the patient again over the next 90 days.           Patient verbalizes understanding of instructions provided today and agrees to view in [place].  Telephone follow up appointment with pharmacy team member scheduled for: 3 months  [name], [place], PharmD, [place] Clinical Pharmacist Practitioner [place] [phone]

## 2021-06-10 ENCOUNTER — Inpatient Hospital Stay: Payer: Medicare HMO

## 2021-06-10 ENCOUNTER — Inpatient Hospital Stay: Payer: Medicare HMO | Admitting: Hematology

## 2021-06-11 ENCOUNTER — Telehealth: Payer: Self-pay | Admitting: Hematology

## 2021-06-11 NOTE — Telephone Encounter (Signed)
Scheduled per sch msg. Called and left msg  

## 2021-06-12 ENCOUNTER — Telehealth: Payer: Self-pay | Admitting: Hematology

## 2021-06-12 NOTE — Telephone Encounter (Signed)
R/s per patient request. Wants to push out to January. Confirmed appt with patient

## 2021-06-13 ENCOUNTER — Inpatient Hospital Stay: Payer: Medicare HMO

## 2021-06-13 ENCOUNTER — Inpatient Hospital Stay: Payer: Medicare HMO | Admitting: Hematology

## 2021-06-25 ENCOUNTER — Other Ambulatory Visit: Payer: Self-pay | Admitting: Nurse Practitioner

## 2021-07-01 ENCOUNTER — Telehealth: Payer: Self-pay | Admitting: Pharmacist

## 2021-07-01 ENCOUNTER — Inpatient Hospital Stay: Payer: Medicare HMO | Attending: Hematology | Admitting: Dietician

## 2021-07-01 ENCOUNTER — Other Ambulatory Visit: Payer: Self-pay

## 2021-07-01 DIAGNOSIS — I10 Essential (primary) hypertension: Secondary | ICD-10-CM | POA: Insufficient documentation

## 2021-07-01 DIAGNOSIS — D45 Polycythemia vera: Secondary | ICD-10-CM | POA: Insufficient documentation

## 2021-07-01 DIAGNOSIS — J449 Chronic obstructive pulmonary disease, unspecified: Secondary | ICD-10-CM | POA: Insufficient documentation

## 2021-07-01 DIAGNOSIS — Z7982 Long term (current) use of aspirin: Secondary | ICD-10-CM | POA: Insufficient documentation

## 2021-07-01 DIAGNOSIS — Z79899 Other long term (current) drug therapy: Secondary | ICD-10-CM | POA: Insufficient documentation

## 2021-07-01 DIAGNOSIS — Z9981 Dependence on supplemental oxygen: Secondary | ICD-10-CM | POA: Insufficient documentation

## 2021-07-01 DIAGNOSIS — F17210 Nicotine dependence, cigarettes, uncomplicated: Secondary | ICD-10-CM | POA: Insufficient documentation

## 2021-07-01 NOTE — Progress Notes (Signed)
Nutrition Assessment ° ° °Reason for Assessment: Provider request ° ° °ASSESSMENT: 61 year old female with polycythemia vera. She is receiving hydroxyurea 1000 mg orally/day as well as monthly therapeutic phlebotomy. Patient is followed by Dr. Kale.  ° °Past medical history includes agoraphobia, arthritis, chronic hypoxemic respiratory failure on 4 L oxygen at night, DM2, DDD, fibromyalgia, GERD, HTN, HLD, depression, metastatic melanoma, right pulmonary nodule s/p right wedge resection (2014), peripheral neuropathy, periodontitis, wears dentures. ° °Met with patient in office. Patient is unsure why she is here today. Patient reports history of agoraphobia, coming out for appointments is challenging for her. Patient is working on smoking cessation, she is not taking Chantix. Patient reports smoking 3 cigarettes yesterday, hoping to have 1 or 2 day. Patient has chronic pain in her hips, she is limited with activity. Patient reports usually not getting out of bed until noon unless she has an appointment. Patient goes to bed around 9 PM. Patient recalls eating one large meal/day due to sleep schedule. Patient will occasionally snack. Patient reports eating mostly red meat, does not care for chicken or turkey. Patient cooks with salt. She likes raw vegetables, but does not buy often because they go bad quickly. Patient drinks diet pepsi and zero sugar kool-aid. Her mouth stays dry, she has a large cup that she is sipping on throughout the day. Patient is having less frequent bowel movements, reports one bowel movement every couple of days.  ° °Nutrition Focused Physical Exam: deferred ° ° °Medications: fenofibrate, gabapentin, cymbalta, hydrodiuril, hydrea, zestril, metformin, senokot, percocet ° ° °Labs: 8/24 HgbA1c 6.1 ° ° °Anthropometrics: Patient weighed 216.6 lb in office today.  ° °Height: 5'8" °Weight: 227 lb 9.6 oz (8/24) °UBW: 230 lb (per pt) °BMI: 34.61 ° ° ° °NUTRITION DIAGNOSIS: Food and nutrition related  knowledge deficit related to cancer as evidenced by no prior need for nutrition related information  ° ° ° °INTERVENTION:  °Congratulated pt on smoking cessation efforts °Educated on heart healthy, low sodium diet - handouts provided °Encouraged small sustainable goals  °Encouraged activity as able °Contact information provided ° ° °MONITORING, EVALUATION, GOAL: Patient will make dietary/lifestyle changes to reduce risk of associated cancer side effects and improve overall health ° ° °Next Visit: ~12 weeks via telephone ° ° ° ° ° ° °

## 2021-07-01 NOTE — Progress Notes (Addendum)
Chronic Care Management Pharmacy Assistant   Name: Katelyn Cline  MRN: [id number] DOB: 09-24-1960   Reason for Encounter: Monthly Medication Coordination Call     Medications: Outpatient Encounter Medications as of 07/01/2021  Medication Sig   amLODipine (NORVASC) 5 MG tablet TAKE 1 TABLET(5 MG) BY MOUTH DAILY   aspirin EC 81 MG tablet Take 1 tablet (81 mg total) by mouth daily.   atorvastatin (LIPITOR) 80 MG tablet TAKE 1 TABLET BY MOUTH DAILY AT 6 PM   Blood Glucose Monitoring Suppl (BLOOD GLUCOSE SYSTEM PAK) KIT Please dispense based on patient and insurance preference. Use as directed to monitor FSBS 2x daily. Dx: E11.9   buPROPion ER (WELLBUTRIN SR) 100 MG 12 hr tablet TAKE 1 TABLET(100 MG) BY MOUTH TWICE DAILY   DULoxetine (CYMBALTA) 60 MG capsule TAKE TWO CAPSULES BY MOUTH EVERY MORNING   fenofibrate (TRICOR) 145 MG tablet TAKE ONE TABLET BY MOUTH EVERYDAY AT BEDTIME   fish oil-omega-3 fatty acids 1000 MG capsule Take 2 capsules (2 g total) by mouth 2 (two) times daily.   gabapentin (NEURONTIN) 100 MG capsule take 1-3 capsules BY MOUTH three times daily AS NEEDED FOR nerve pain   Glucose Blood (BLOOD GLUCOSE TEST STRIPS) STRP Please dispense based on patient and insurance preference. Use as directed to monitor FSBS 2x daily. Dx: E11.9   hydrochlorothiazide (HYDRODIURIL) 25 MG tablet TAKE ONE TABLET BY MOUTH EVERY MORNING   hydroxyurea (HYDREA) 500 MG capsule TAKE TWO CAPSULES BY MOUTH EVERYDAY AT BEDTIME   lisinopril (ZESTRIL) 5 MG tablet TAKE 1 TABLET (5 MG) BY MOUTH AT BEDTIME   metFORMIN (GLUCOPHAGE) 1000 MG tablet Take 1 tablet (1,000 mg total) by mouth 2 (two) times daily with a meal.   Microlet Lancets MISC Please dispense based on patient and insurance preference. Use as directed to monitor FSBS 2x daily. Dx: E11.9   nystatin (MYCOSTATIN/NYSTOP) powder Apply 1 application topically 3 (three) times daily. PA Case: [id number] Approved 06/30/2019- 06/28/2020   nystatin cream  (MYCOSTATIN) Apply 1 application topically 2 (two) times daily. To affected areas   oxyCODONE-acetaminophen (PERCOCET) 10-325 MG tablet Take 1 tablet by mouth 4 (four) times daily as needed for severe pain.   senna (SENOKOT) 8.6 MG TABS tablet Take 2 tablets by mouth daily as needed for mild constipation.   varenicline (CHANTIX) 1 MG tablet TAKE ONE TABLET BY MOUTH twice daily   No facility-administered encounter medications on file as of 07/01/2021.    Reviewed chart for medication changes ahead of medication coordination call.  No OVs, Consults, or hospital visits since last care coordination call/Pharmacist visit. (If appropriate, list visit date, provider name)  No medication changes indicated OR if recent visit, treatment plan here.  BP Readings from Last 3 Encounters:  02/24/21 (!) 153/70  02/19/21 130/88  01/24/21 136/73    Lab Results  Component Value Date   HGBA1C 6.1 (H) 02/19/2021     Patient obtains medications through Adherence Packaging  30 Days   Last adherence delivery included: (medication name and frequency)  Fenofibrate 145 mg 1 tablet at bedtime Duloxetine 60 mg 2 tablets at breakfast Gabapentin 100 mg 1-3 three times daily as needed (Not in packing) Hydroxyurea 500 mg 2 tablets at bedtime  Metformin 1000 mg 1 tablet at breakfast and 1 tablet at bedtime. Amlodipine 5 mg 1 tablet at bedtime Lisinopril 5 mg 1 tablet at bedtime Atorvastatin 80 mg 1 tablet at bedtime [place] Sr 100 mg 1 tablet at breakfast and  1 tablet at bedtime Hctz 25 mg 1 tablet at breakfast Varenicline 1 mg 1 tab twice daily Fish Oil-Omega 3 Fatty Acids 1000 mg 2 tablets at breakfast 2 tablets at bedtime. Aspirin 81 mg 1 tablet of 81 mg at breakfast    Patient is due for next adherence delivery on: 07/14/21. Called patient and reviewed medications and coordinated delivery.  This delivery to include:  Fenofibrate 145 mg 1 tablet at bedtime Duloxetine 60 mg 2 tablets at  breakfast Gabapentin 100 mg 1-3 three times daily as needed (Not in packing) Hydroxyurea 500 mg 2 tablets at bedtime  Metformin 1000 mg 1 tablet at breakfast and 1 tablet at bedtime. Amlodipine 5 mg 1 tablet at bedtime Lisinopril 5 mg 1 tablet at bedtime Atorvastatin 80 mg 1 tablet at bedtime [place] Sr 100 mg 1 tablet at breakfast and 1 tablet at bedtime Hctz 25 mg 1 tablet at breakfast Varenicline 1 mg 1 tab twice daily Fish Oil-Omega 3 Fatty Acids 1000 mg 2 tablets at breakfast 2 tablets at bedtime. Aspirin 81 mg 1 tablet of 81 mg at breakfast    Patient needs refills for None.   Confirmed delivery date of 07/14/21, advised patient that pharmacy will contact them the morning of delivery.   Care Gaps   AWV: unknown  Colonoscopy: done 04/20/17 DM Eye Exam: overdue 09/07/18 DM Foot Exam:  due 02/19/22 Microalbumin: done 12/04/20 HbgAIC: done 02/19/21 (6.1) DEXA: unknown Mammogram:03/31/2017  Star Rating Drugs: metFORMIN (GLUCOPHAGE) 1000 MG tablet - 06/05/21 30 days  lisinopril (ZESTRIL) 5 MG tablet - 06/05/21 30 days  atorvastatin (LIPITOR) 80 MG tablet -  06/05/21 30 days   Future Appointments  Date Time Provider [place]  07/14/2021  8:30 AM [facility] LAB [facility] None  07/14/2021  9:00 AM [name], MD [facility] None  07/14/2021 10:00 AM [facility] INFUSION [facility] None  09/11/2021  2:15 PM [facility] PHARMACIST [facility] None   Multiple attempts were made to contact patient. Attempts were unsuccessful. / ls,CMA   Jobe Gibbon, [place] Pharmacist Assistant  [phone]  12 minutes spent in review, coordination, and documentation.  Reviewed by: Beverly Milch, PharmD Clinical Pharmacist [phone]

## 2021-07-14 ENCOUNTER — Inpatient Hospital Stay: Payer: Medicare HMO

## 2021-07-14 ENCOUNTER — Other Ambulatory Visit: Payer: Self-pay

## 2021-07-14 ENCOUNTER — Inpatient Hospital Stay: Payer: Medicare HMO | Admitting: Hematology

## 2021-07-14 VITALS — BP 150/61 | HR 82 | Temp 98.1°F | Resp 17 | Ht 68.0 in | Wt 221.1 lb

## 2021-07-14 DIAGNOSIS — Z9981 Dependence on supplemental oxygen: Secondary | ICD-10-CM | POA: Diagnosis not present

## 2021-07-14 DIAGNOSIS — D471 Chronic myeloproliferative disease: Secondary | ICD-10-CM

## 2021-07-14 DIAGNOSIS — J449 Chronic obstructive pulmonary disease, unspecified: Secondary | ICD-10-CM | POA: Diagnosis not present

## 2021-07-14 DIAGNOSIS — D45 Polycythemia vera: Secondary | ICD-10-CM

## 2021-07-14 DIAGNOSIS — I10 Essential (primary) hypertension: Secondary | ICD-10-CM | POA: Diagnosis not present

## 2021-07-14 DIAGNOSIS — Z79899 Other long term (current) drug therapy: Secondary | ICD-10-CM | POA: Diagnosis not present

## 2021-07-14 DIAGNOSIS — F17210 Nicotine dependence, cigarettes, uncomplicated: Secondary | ICD-10-CM | POA: Diagnosis not present

## 2021-07-14 DIAGNOSIS — Z7982 Long term (current) use of aspirin: Secondary | ICD-10-CM | POA: Diagnosis not present

## 2021-07-14 LAB — CBC WITH DIFFERENTIAL/PLATELET
Abs Immature Granulocytes: 0.22 10*3/uL — ABNORMAL HIGH (ref 0.00–0.07)
Basophils Absolute: 0.2 10*3/uL — ABNORMAL HIGH (ref 0.0–0.1)
Basophils Relative: 1 %
Eosinophils Absolute: 0.5 10*3/uL (ref 0.0–0.5)
Eosinophils Relative: 3 %
HCT: 47.4 % — ABNORMAL HIGH (ref 36.0–46.0)
Hemoglobin: 13.9 g/dL (ref 12.0–15.0)
Immature Granulocytes: 1 %
Lymphocytes Relative: 8 %
Lymphs Abs: 1.6 10*3/uL (ref 0.7–4.0)
MCH: 25.2 pg — ABNORMAL LOW (ref 26.0–34.0)
MCHC: 29.3 g/dL — ABNORMAL LOW (ref 30.0–36.0)
MCV: 86 fL (ref 80.0–100.0)
Monocytes Absolute: 0.6 10*3/uL (ref 0.1–1.0)
Monocytes Relative: 3 %
Neutro Abs: 16.6 10*3/uL — ABNORMAL HIGH (ref 1.7–7.7)
Neutrophils Relative %: 84 %
Platelets: 522 10*3/uL — ABNORMAL HIGH (ref 150–400)
RBC: 5.51 MIL/uL — ABNORMAL HIGH (ref 3.87–5.11)
RDW: 20.8 % — ABNORMAL HIGH (ref 11.5–15.5)
WBC: 19.8 10*3/uL — ABNORMAL HIGH (ref 4.0–10.5)
nRBC: 0.3 % — ABNORMAL HIGH (ref 0.0–0.2)

## 2021-07-14 LAB — CMP (CANCER CENTER ONLY)
ALT: 10 U/L (ref 0–44)
AST: 10 U/L — ABNORMAL LOW (ref 15–41)
Albumin: 4 g/dL (ref 3.5–5.0)
Alkaline Phosphatase: 119 U/L (ref 38–126)
Anion gap: 8 (ref 5–15)
BUN: 20 mg/dL (ref 8–23)
CO2: 29 mmol/L (ref 22–32)
Calcium: 9.4 mg/dL (ref 8.9–10.3)
Chloride: 101 mmol/L (ref 98–111)
Creatinine: 0.62 mg/dL (ref 0.44–1.00)
GFR, Estimated: >60 mL/min (ref >60)
Glucose, Bld: 161 mg/dL — ABNORMAL HIGH (ref 70–99)
Potassium: 4.4 mmol/L (ref 3.5–5.1)
Sodium: 138 mmol/L (ref 135–145)
Total Bilirubin: 0.5 mg/dL (ref 0.3–1.2)
Total Protein: 7.4 g/dL (ref 6.5–8.1)

## 2021-07-14 NOTE — Progress Notes (Signed)
HEMATOLOGY/ONCOLOGY CLINIC NOTE  Date of Service: 07/14/2021  Patient Care Team: Eulogio Bear, NP as PCP - General (Nurse Practitioner) [name], MD as Consulting Physician (Hematology) [name], [facility] as Pharmacist (Pharmacist)  CHIEF COMPLAINTS/PURPOSE OF CONSULTATION:   Follow-up for continued management of JAK2 positive polycythemia vera  HISTORY OF PRESENTING ILLNESS:  Please see previous note for details of initial presentation  INTERVAL HISTORY:   Katelyn Cline is here for a detailed follow-up of her JAK2 positive polycythemia vera after her last clinic with [place] on 01/24/2021.  Patient rescheduled her previous appointment tomorrow times.  She notes she was having several personal stressors that were taking her time and attention.  No new infection issues. Patient notes she has been trying to get more compliant with taking hydroxyurea as prescribed but still has been missing about 1 dose per week. No new bone pains.  Labs today show CBC with a hemoglobin of 13.9 hematocrit of 47.4, WBC count 19.8k and platelets of 522k Chemistries stable except blood sugar of 161.  No other acute new symptoms. Continues to be on oxygen by nasal cannula.   MEDICAL HISTORY:  Past Medical History:  Diagnosis Date   Agoraphobia    Arthritis    "all joints"   Chronic low back pain    Chronic respiratory failure with hypoxia ([name])    4L  via [place],  followed by pcp,   (09-16-2018  per pt only uses while at home and at night ,  portable oxygen is not working)   COPD (chronic obstructive pulmonary disease) ([place])    DDD (degenerative disc disease), lumbosacral    Dental caries    DM type 2 (diabetes mellitus, type 2) ([place])    followed by pcp   Fibromyalgia    GERD (gastroesophageal reflux disease)    occasional , no meds   History of basal cell carcinoma (BCC) excision    s/p  Moh's of face/ nose 12/ 2013   History of palpitations    per pt found to have  occaional PVCs   History of UTI    HTN (hypertension)    Hyperlipidemia    Loose, teeth    Major depression    Metastatic melanoma ([place]) 10/18/2012   12 mm posterior right upper lobe pulmonary nodule, max SUV 3.0  s/p  right wedge resection 10-21-2012,  followed by oncologist-- dr Irene Limbo,  no recurrence   Mixed stress and urge urinary incontinence    Myeloproliferative neoplasm ([place])    Narcolepsy    OSA (obstructive sleep apnea)    Oxygen dependent    4 L via [place],  per pt portable oxygen not working but does use while at home and at night   Panic attacks    Periodontitis    Peripheral neuropathy    feet   Polycythemia vera(238.4) hematology/ oncology-- dr Irene Limbo ([facility])   first dx 02014 due to smoker/copd--  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)  hx phlebotomies   Pulmonary nodules    bilateral small stable per CT 03-11-2018   Scoliosis    Smokers' cough ([place])    per pt productive a little in the morning's   Symptoms of upper respiratory infection (URI) 03/12/2018   Wears glasses     SURGICAL HISTORY: Past Surgical History:  Procedure Laterality Date   ABDOMINAL HYSTERECTOMY  1998   BREAST LUMPECTOMY Bilateral 1995   benign per pt   COLONOSCOPY W/ BIOPSIES AND  POLYPECTOMY     Hx: of   CYSTECTOMY  2000   abdominal wall    DILATION AND CURETTAGE OF UTERUS  yrs ago   MOHS SURGERY  2013   nose/face   MULTIPLE EXTRACTIONS WITH ALVEOLOPLASTY N/A 09/19/2018   Procedure: Extraction of tooth #'s 2, 3, 6-14, 18, and 20-30 with alveoloplasty;  Surgeon: [name], DDS;  Location: [facility];  Service: Oral Surgery;  Laterality: N/A;  GENERAL WITH NASAL TUBE   VIDEO ASSISTED THORACOSCOPY (VATS)/WEDGE RESECTION Right 10/21/2012   Procedure: VIDEO ASSISTED THORACOSCOPY (VATS)/WEDGE RESECTION;  Surgeon: [name], MD;  Location: [place];  Service: Thoracic;  Laterality: Right;   VIDEO BRONCHOSCOPY N/A 10/21/2012   Procedure: VIDEO BRONCHOSCOPY;   Surgeon: [name], MD;  Location: [facility] OR;  Service: Thoracic;  Laterality: N/A;    SOCIAL HISTORY: Social History   Socioeconomic History   Marital status: Divorced    Spouse name: Not on file   Number of children: Not on file   Years of education: 12+   Highest education level: Not on file  Occupational History   Not on file  Tobacco Use   Smoking status: Every Day    Packs/day: 0.50    Years: 50.00    Pack years: 25.00    Types: Cigarettes   Smokeless tobacco: Never  Vaping Use   Vaping Use: Former   Quit date: 09/15/2016  Substance and Sexual Activity   Alcohol use: Yes    Comment: occasional   Drug use: No   Sexual activity: Not on file  Other Topics Concern   Not on file  Social History Narrative   Not on file   Social Determinants of Health   Financial Resource Strain: Not on file  Food Insecurity: Not on file  Transportation Needs: Not on file  Physical Activity: Not on file  Stress: Not on file  Social Connections: Not on file  Intimate Partner Violence: Not on file    FAMILY HISTORY: Family History  Problem Relation Age of Onset   Arthritis Mother    Hyperlipidemia Mother    Depression Mother    Anxiety disorder Mother    Dementia Mother    Hypertension Father    Hyperlipidemia Father    Heart disease Father    Stroke Father    Dementia Father    Heart disease Brother    ADD / ADHD Son    Alcohol abuse Maternal Grandfather    Bipolar disorder Neg Hx    Drug abuse Neg Hx    OCD Neg Hx    Paranoid behavior Neg Hx    Schizophrenia Neg Hx    Seizures Neg Hx    Sexual abuse Neg Hx    Physical abuse Neg Hx     ALLERGIES:  is allergic to contrast media [iodinated contrast media], erythromycin, flagyl [metronidazole], latex, other, penicillins, and tetracyclines & related.  MEDICATIONS:  Current Outpatient Medications  Medication Sig Dispense Refill   amLODipine (NORVASC) 5 MG tablet TAKE 1 TABLET(5 MG) BY MOUTH DAILY 90 tablet 1    aspirin EC 81 MG tablet Take 1 tablet (81 mg total) by mouth daily. 90 tablet 1   atorvastatin (LIPITOR) 80 MG tablet TAKE 1 TABLET BY MOUTH DAILY AT 6 PM 90 tablet 1   Blood Glucose Monitoring Suppl (BLOOD GLUCOSE SYSTEM PAK) KIT Please dispense based on patient and insurance preference. Use as directed to monitor FSBS 2x daily. Dx: E11.9 1 kit 1  buPROPion ER (WELLBUTRIN SR) 100 MG 12 hr tablet TAKE 1 TABLET(100 MG) BY MOUTH TWICE DAILY 180 tablet 1   DULoxetine (CYMBALTA) 60 MG capsule TAKE TWO CAPSULES BY MOUTH EVERY MORNING 180 capsule 1   fenofibrate (TRICOR) 145 MG tablet TAKE ONE TABLET BY MOUTH EVERYDAY AT BEDTIME 90 tablet 1   fish oil-omega-3 fatty acids 1000 MG capsule Take 2 capsules (2 g total) by mouth 2 (two) times daily. 180 capsule 1   gabapentin (NEURONTIN) 100 MG capsule take 1-3 capsules BY MOUTH three times daily AS NEEDED FOR nerve pain 270 capsule 1   Glucose Blood (BLOOD GLUCOSE TEST STRIPS) STRP Please dispense based on patient and insurance preference. Use as directed to monitor FSBS 2x daily. Dx: E11.9 100 strip 1   hydrochlorothiazide (HYDRODIURIL) 25 MG tablet TAKE ONE TABLET BY MOUTH EVERY MORNING 90 tablet 1   hydroxyurea (HYDREA) 500 MG capsule TAKE TWO CAPSULES BY MOUTH EVERYDAY AT BEDTIME 90 capsule 3   lisinopril (ZESTRIL) 5 MG tablet TAKE 1 TABLET (5 MG) BY MOUTH AT BEDTIME 90 tablet 1   metFORMIN (GLUCOPHAGE) 1000 MG tablet Take 1 tablet (1,000 mg total) by mouth 2 (two) times daily with a meal. 180 tablet 1   Microlet Lancets MISC Please dispense based on patient and insurance preference. Use as directed to monitor FSBS 2x daily. Dx: E11.9 100 each 11   nystatin (MYCOSTATIN/NYSTOP) powder Apply 1 application topically 3 (three) times daily. PA Case: [id number] Approved 06/30/2019- 06/28/2020 60 g 2   nystatin cream (MYCOSTATIN) Apply 1 application topically 2 (two) times daily. To affected areas 60 g 2   oxyCODONE-acetaminophen (PERCOCET) 10-325 MG tablet Take 1  tablet by mouth 4 (four) times daily as needed for severe pain.     senna (SENOKOT) 8.6 MG TABS tablet Take 2 tablets by mouth daily as needed for mild constipation.     varenicline (CHANTIX) 1 MG tablet TAKE ONE TABLET BY MOUTH twice daily 60 tablet 3   No current facility-administered medications for this visit.    REVIEW OF SYSTEMS:   .10 Point review of Systems was done is negative except as noted above.  PHYSICAL EXAMINATION: ECOG PERFORMANCE STATUS:3  Vitals:   07/14/21 0902  BP: (!) 150/61  Pulse: 82  Resp: 17  Temp: 98.1 F (36.7 C)  SpO2: 94%    Filed Weights   07/14/21 0902  Weight: 221 lb 1.6 oz (100.3 kg)    .Body mass index is 33.62 kg/m.   GENERAL:alert, in no acute distress and comfortable SKIN: no acute rashes, no significant lesions EYES: conjunctiva are pink and non-injected, sclera anicteric OROPHARYNX: MMM, no exudates, no oropharyngeal erythema or ulceration NECK: supple, no JVD LYMPH:  no palpable lymphadenopathy in the cervical, axillary or inguinal regions LUNGS: clear to auscultation b/l with normal respiratory effort HEART: regular rate & rhythm ABDOMEN:  normoactive bowel sounds , non tender, not distended. Extremity: no pedal edema PSYCH: alert & oriented x 3 with fluent speech NEURO: no focal motor/sensory deficits    LABORATORY DATA:  I have reviewed the data as listed  . CBC Latest Ref Rng & Units 07/14/2021 02/24/2021 01/24/2021  WBC 4.0 - 10.5 K/uL 19.8(H) 11.8(H) 11.2(H)  Hemoglobin 12.0 - 15.0 g/dL 13.9 13.7 15.1(H)  Hematocrit 36.0 - 46.0 % 47.4(H) 45.5 50.0(H)  Platelets 150 - 400 K/uL 522(H) 394 209   . CBC    Component Value Date/Time   WBC 19.8 (H) 07/14/2021 0839   RBC 5.51 (H)  07/14/2021 0839   HGB 13.9 07/14/2021 0839   HGB 15.1 (H) 01/24/2021 1158   HCT 47.4 (H) 07/14/2021 0839   PLT 522 (H) 07/14/2021 0839   PLT 209 01/24/2021 1158   MCV 86.0 07/14/2021 0839   MCH 25.2 (L) 07/14/2021 0839   MCHC 29.3 (L)  07/14/2021 0839   RDW 20.8 (H) 07/14/2021 0839   LYMPHSABS 1.6 07/14/2021 0839   MONOABS 0.6 07/14/2021 0839   EOSABS 0.5 07/14/2021 0839   BASOSABS 0.2 (H) 07/14/2021 0839     . CMP Latest Ref Rng & Units 02/24/2021 01/24/2021 12/25/2020  Glucose 70 - 99 mg/dL 203(H) 226(H) 176(H)  BUN 8 - 23 mg/dL _0 Creatinine 0.44 - 1.00 mg/dL 0.72 0.78 0.73  Sodium 135 - 145 mmol/L 139 140 141  Potassium 3.5 - 5.1 mmol/L 3.9 4.0 4.2  Chloride 98 - 111 mmol/L 100 98 104  CO2 22 - 32 mmol/L _1 Calcium 8.9 - 10.3 mg/dL 9.7 10.1 9.9  Total Protein 6.5 - 8.1 g/dL 7.2 7.6 7.1  Total Bilirubin 0.3 - 1.2 mg/dL 0.3 0.5 0.3  Alkaline Phos 38 - 126 U/L 99 110 126  AST 15 - 41 U/L 11(L) 10(L) 11(L)  ALT 0 - 44 U/L _2 08/15/12 JAK2 Mutation:     03/15/18 BM Bx:      RADIOGRAPHIC STUDIES: I have personally reviewed the radiological images as listed and agreed with the findings in the report. No results found.  ASSESSMENT & PLAN:   61 y.o. female with  1. Jak2 V617F mutation positive Myeloproliferative syndrome.- BM Bx consistent with Polycythemia Vera  JAK2 mutation identified in 08/15/12 labs  03/15/18 BM Bx revealed results consistent with JAK2 positive polycythemia vera    2. H/o Medication/followup Non compliance  3. COPD - O2 dependant  PLAN -Labs today show CBC with a hemoglobin of 13.9 hematocrit of 47.4, WBC count 19.8k and platelets of 522k Chemistries stable except blood sugar of 161. -Patient has been missing about 1 dose of hydroxyurea per week. -Counseled patient to maintain 100% compliance with her current dose of hydroxyurea at 1000 mg p.o. daily with food. -We will hold off on therapeutic phlebotomy today since her hematocrit is less than 48 (which is our goal with her COPD related secondary polycythemia and tolerance to phlebotomy) -We will repeat her labs in 2 months and if her platelets are still elevated despite medication compliance might need to  increase the hydroxyurea dose. -Recommended patient maintain appropriate hydration. -Continue vitamin B complex 1 capsule p.o. daily  4. History of Metastatic melanoma to the right lung  S/p resection by Dr. Servando Snare on 10/21/12, BRAF mutation not detected. -continue q80monthy skin screening her PCP/dermatologist -symptom directed radiologic scanning.   5.  Past Medical History:  Diagnosis Date   Agoraphobia    Arthritis    "all joints"   Chronic low back pain    Chronic respiratory failure with hypoxia ([name])    4L  via [place],  followed by pcp,   (09-16-2018  per pt only uses while at home and at night ,  portable oxygen is not working)   COPD ([place]ronic obstructive pulmonary disease) ([place]    DDD (degenerative disc disease), lumbosacral    Dental caries    DM type 2 (diabetes mellitus, type 2) ([place]    followed by pcp   Fibromyalgia    GERD (gastroesophageal reflux disease)    occasional , no meds  History of basal cell carcinoma (BCC) excision    s/p  Moh's of face/ nose 12/ 2013   History of palpitations    per pt found to have occaional PVCs   History of UTI    HTN (hypertension)    Hyperlipidemia    Loose, teeth    Major depression    Metastatic melanoma ([place]) 10/18/2012   12 mm posterior right upper lobe pulmonary nodule, max SUV 3.0  s/p  right wedge resection 10-21-2012,  followed by oncologist-- dr Irene Limbo,  no recurrence   Mixed stress and urge urinary incontinence    Myeloproliferative neoplasm ([place])    Narcolepsy    OSA (obstructive sleep apnea)    Oxygen dependent    4 L via [place],  per pt portable oxygen not working but does use while at home and at night   Panic attacks    Periodontitis    Peripheral neuropathy    feet   Polycythemia vera(238.4) hematology/ oncology-- dr Irene Limbo ([facility])   first dx 02014 due to smoker/copd--  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)  hx phlebotomies   Pulmonary nodules    bilateral small stable per CT  03-11-2018   Scoliosis    Smokers' cough ([place])    per pt productive a little in the morning's   Symptoms of upper respiratory infection (URI) 03/12/2018   Wears glasses     FOLLOW UP: note: Return to clinic with [name] with labs and appointment for therapeutic phlebotomy in 2 months    All of the patient's questions were answered with apparent satisfaction. The patient knows to call the clinic with any problems, questions or concerns.   Sullivan Lone MD [place] AAHIVMS [facility] Hematology/Oncology Physician [facility]

## 2021-07-20 ENCOUNTER — Encounter: Payer: Self-pay | Admitting: Hematology

## 2021-07-29 ENCOUNTER — Other Ambulatory Visit (HOSPITAL_COMMUNITY): Payer: Medicare HMO

## 2021-07-31 ENCOUNTER — Telehealth: Payer: Self-pay | Admitting: Pharmacist

## 2021-07-31 NOTE — Progress Notes (Addendum)
° ° °Chronic Care Management °Pharmacy Assistant  ° ° °Name: Katelyn Cline  MRN: [id number] DOB: 03/26/1961 ° ° °Reason for Encounter: Monthly Medication Coordination Call  °  ° ° °Medications: °Outpatient Encounter Medications as of 07/31/2021  °Medication Sig  ° amLODipine (NORVASC) 5 MG tablet TAKE 1 TABLET(5 MG) BY MOUTH DAILY  ° aspirin EC 81 MG tablet Take 1 tablet (81 mg total) by mouth daily.  ° atorvastatin (LIPITOR) 80 MG tablet TAKE 1 TABLET BY MOUTH DAILY AT 6 PM  ° Blood Glucose Monitoring Suppl (BLOOD GLUCOSE SYSTEM PAK) KIT Please dispense based on patient and insurance preference. Use as directed to monitor FSBS 2x daily. Dx: E11.9  ° buPROPion ER (WELLBUTRIN SR) 100 MG 12 hr tablet TAKE 1 TABLET(100 MG) BY MOUTH TWICE DAILY  ° DULoxetine (CYMBALTA) 60 MG capsule TAKE TWO CAPSULES BY MOUTH EVERY MORNING  ° fenofibrate (TRICOR) 145 MG tablet TAKE ONE TABLET BY MOUTH EVERYDAY AT BEDTIME  ° fish oil-omega-3 fatty acids 1000 MG capsule Take 2 capsules (2 g total) by mouth 2 (two) times daily.  ° gabapentin (NEURONTIN) 100 MG capsule take 1-3 capsules BY MOUTH three times daily AS NEEDED FOR nerve pain  ° Glucose Blood (BLOOD GLUCOSE TEST STRIPS) STRP Please dispense based on patient and insurance preference. Use as directed to monitor FSBS 2x daily. Dx: E11.9  ° hydrochlorothiazide (HYDRODIURIL) 25 MG tablet TAKE ONE TABLET BY MOUTH EVERY MORNING  ° hydroxyurea (HYDREA) 500 MG capsule TAKE TWO CAPSULES BY MOUTH EVERYDAY AT BEDTIME  ° lisinopril (ZESTRIL) 5 MG tablet TAKE 1 TABLET (5 MG) BY MOUTH AT BEDTIME  ° metFORMIN (GLUCOPHAGE) 1000 MG tablet Take 1 tablet (1,000 mg total) by mouth 2 (two) times daily with a meal.  ° Microlet Lancets MISC Please dispense based on patient and insurance preference. Use as directed to monitor FSBS 2x daily. Dx: E11.9  ° nystatin (MYCOSTATIN/NYSTOP) powder Apply 1 application topically 3 (three) times daily. PA Case: [id number] Approved 06/30/2019- 06/28/2020  ° nystatin  cream (MYCOSTATIN) Apply 1 application topically 2 (two) times daily. To affected areas  ° oxyCODONE-acetaminophen (PERCOCET) 10-325 MG tablet Take 1 tablet by mouth 4 (four) times daily as needed for severe pain.  ° senna (SENOKOT) 8.6 MG TABS tablet Take 2 tablets by mouth daily as needed for mild constipation.  ° varenicline (CHANTIX) 1 MG tablet TAKE ONE TABLET BY MOUTH twice daily  ° °No facility-administered encounter medications on file as of 07/31/2021.  ° ° °Reviewed chart for medication changes ahead of medication coordination call. ° °No OVs, Consults, or hospital visits since last care coordination call/Pharmacist visit. (If appropriate, list visit date, provider name) ° °No medication changes indicated OR if recent visit, treatment plan here. ° °BP Readings from Last 3 Encounters:  °07/14/21 (!) 150/61  °02/24/21 (!) 153/70  °02/19/21 130/88  °  °Lab Results  °Component Value Date  ° HGBA1C 6.1 (H) 02/19/2021  °  ° °Patient obtains medications through Adherence Packaging  30 Days  ° °Last adherence delivery included: (medication name and frequency) ° °Fenofibrate 145 mg 1 tablet at bedtime °Duloxetine 60 mg 2 tablets at breakfast °Gabapentin 100 mg 1-3 three times daily as needed (Not in packing) °Hydroxyurea 500 mg 2 tablets at bedtime  °Metformin 1000 mg 1 tablet at breakfast and 1 tablet at bedtime. °Amlodipine 5 mg 1 tablet at bedtime °Lisinopril 5 mg 1 tablet at bedtime °Atorvastatin 80 mg 1 tablet at bedtime °[place] Sr 100 mg 1 tablet   at breakfast and 1 tablet at bedtime °Hctz 25 mg 1 tablet at breakfast °Varenicline 1 mg 1 tab twice daily °Fish Oil-Omega 3 Fatty Acids 1000 mg 2 tablets at breakfast 2 tablets at bedtime. °Aspirin 81 mg 1 tablet of 81 mg at breakfast  ° ° °Patient is due for next adherence delivery on: 08/12/21. °Called patient and reviewed medications and coordinated delivery. ° °This delivery to include: ° °Fenofibrate 145 mg 1 tablet at bedtime °Duloxetine 60 mg 2 tablets at  breakfast °Hydroxyurea 500 mg 2 tablets at bedtime  °Metformin 1000 mg 1 tablet at breakfast and 1 tablet at bedtime. °Amlodipine 5 mg 1 tablet at bedtime °Lisinopril 5 mg 1 tablet at bedtime °Atorvastatin 80 mg 1 tablet at bedtime °[place] Sr 100 mg 1 tablet at breakfast and 1 tablet at bedtime °Hctz 25 mg 1 tablet at breakfast °Fish Oil-Omega 3 Fatty Acids 1000 mg 2 tablets at breakfast 2 tablets at bedtime. °Aspirin 81 mg 1 tablet of 81 mg at breakfast  ° °Patient needs refills for None. ° °Patient declined: Gabapentin and Varencline (Chantix) this month reports she has plenty on hand.  ° °Confirmed delivery date of 08/12/21, advised patient that pharmacy will contact them the morning of delivery. ° ° °Care Gaps °  °AWV: unknown  °Colonoscopy: done 04/20/17 °DM Eye Exam: overdue 09/07/18 °DM Foot Exam:  due 02/19/22 °Microalbumin: done 12/04/20 °HbgAIC: done 02/19/21 (6.1) °DEXA: unknown °Mammogram:03/31/2017 ° ° °Star Rating Drugs: °metFORMIN (GLUCOPHAGE) 1000 MG tablet - 07/09/21 30 days  °lisinopril (ZESTRIL) 5 MG tablet - 07/09/21 30 days  °atorvastatin (LIPITOR) 80 MG tablet -  07/09/21 30 days  ° ° °Future Appointments  °Date Time Provider Department Center  °09/11/2021  2:15 PM [facility] PHARMACIST [facility] None  °09/12/2021  8:30 AM [facility] LAB [facility] None  °09/12/2021  9:00 AM Kale, Gautam Kishore, MD [facility] None  °09/12/2021  9:45 AM [facility] INFUSION [facility] None  ° ° ° °Liza Showfety, CCMA °Clinical Pharmacist Assistant  °[phone] ° °9 minutes spent in review, coordination, and documentation. ° °Reviewed by: °Christian Davis, PharmD °Clinical Pharmacist °[phone] ° °

## 2021-08-29 ENCOUNTER — Telehealth: Payer: Self-pay | Admitting: Pharmacist

## 2021-08-29 NOTE — Progress Notes (Addendum)
? ? ?Chronic Care Management ?Pharmacy Assistant  ? ?Name: Katelyn Cline  MRN: [id number] DOB: 1960-07-21 ? ? ?Reason for Encounter: Monthly Medication Coordination Call ?  ? ? ?Medications: ?Outpatient Encounter Medications as of 08/29/2021  ?Medication Sig  ? amLODipine (NORVASC) 5 MG tablet TAKE 1 TABLET(5 MG) BY MOUTH DAILY  ? aspirin EC 81 MG tablet Take 1 tablet (81 mg total) by mouth daily.  ? atorvastatin (LIPITOR) 80 MG tablet TAKE 1 TABLET BY MOUTH DAILY AT 6 PM  ? Blood Glucose Monitoring Suppl (BLOOD GLUCOSE SYSTEM PAK) KIT Please dispense based on patient and insurance preference. Use as directed to monitor FSBS 2x daily. Dx: E11.9  ? buPROPion ER (WELLBUTRIN SR) 100 MG 12 hr tablet TAKE 1 TABLET(100 MG) BY MOUTH TWICE DAILY  ? DULoxetine (CYMBALTA) 60 MG capsule TAKE TWO CAPSULES BY MOUTH EVERY MORNING  ? fenofibrate (TRICOR) 145 MG tablet TAKE ONE TABLET BY MOUTH EVERYDAY AT BEDTIME  ? fish oil-omega-3 fatty acids 1000 MG capsule Take 2 capsules (2 g total) by mouth 2 (two) times daily.  ? gabapentin (NEURONTIN) 100 MG capsule take 1-3 capsules BY MOUTH three times daily AS NEEDED FOR nerve pain  ? Glucose Blood (BLOOD GLUCOSE TEST STRIPS) STRP Please dispense based on patient and insurance preference. Use as directed to monitor FSBS 2x daily. Dx: E11.9  ? hydrochlorothiazide (HYDRODIURIL) 25 MG tablet TAKE ONE TABLET BY MOUTH EVERY MORNING  ? hydroxyurea (HYDREA) 500 MG capsule TAKE TWO CAPSULES BY MOUTH EVERYDAY AT BEDTIME  ? lisinopril (ZESTRIL) 5 MG tablet TAKE 1 TABLET (5 MG) BY MOUTH AT BEDTIME  ? metFORMIN (GLUCOPHAGE) 1000 MG tablet Take 1 tablet (1,000 mg total) by mouth 2 (two) times daily with a meal.  ? Microlet Lancets MISC Please dispense based on patient and insurance preference. Use as directed to monitor FSBS 2x daily. Dx: E11.9  ? nystatin (MYCOSTATIN/NYSTOP) powder Apply 1 application topically 3 (three) times daily. PA Case: [id number] Approved 06/30/2019- 06/28/2020  ? nystatin cream  (MYCOSTATIN) Apply 1 application topically 2 (two) times daily. To affected areas  ? oxyCODONE-acetaminophen (PERCOCET) 10-325 MG tablet Take 1 tablet by mouth 4 (four) times daily as needed for severe pain.  ? senna (SENOKOT) 8.6 MG TABS tablet Take 2 tablets by mouth daily as needed for mild constipation.  ? varenicline (CHANTIX) 1 MG tablet TAKE ONE TABLET BY MOUTH twice daily  ? ?No facility-administered encounter medications on file as of 08/29/2021.  ? ? ?Reviewed chart for medication changes ahead of medication coordination call. ? ?No OVs, Consults, or hospital visits since last care coordination call/Pharmacist visit. (If appropriate, list visit date, provider name) ? ?No medication changes indicated OR if recent visit, treatment plan here. ? ?BP Readings from Last 3 Encounters:  ?07/14/21 (!) 150/61  ?02/24/21 (!) 153/70  ?02/19/21 130/88  ?  ?Lab Results  ?Component Value Date  ? HGBA1C 6.1 (H) 02/19/2021  ?  ? ?Patient obtains medications through Adherence Packaging  30 Days  ? ?Last adherence delivery included: (medication name and frequency) ? ?Fenofibrate 145 mg 1 tablet at bedtime ?Duloxetine 60 mg 2 tablets at breakfast ?Hydroxyurea 500 mg 2 tablets at bedtime  ?Metformin 1000 mg 1 tablet at breakfast and 1 tablet at bedtime. ?Amlodipine 5 mg 1 tablet at bedtime ?Lisinopril 5 mg 1 tablet at bedtime ?Atorvastatin 80 mg 1 tablet at bedtime ?[place] Sr 100 mg 1 tablet at breakfast and 1 tablet at bedtime ?Hctz 25 mg 1 tablet at breakfast ?  Fish Oil-Omega 3 Fatty Acids 1000 mg 2 tablets at breakfast 2 tablets at bedtime. ?Aspirin 81 mg 1 tablet of 81 mg at breakfast  ? ?Patient declined (meds) last month due to PRN use/additional supply on hand. ?Explanation of abundance on hand (ie #30 due to overlapping fills or previous adherence issues etc) ? ?Patient is due for next adherence delivery on: 09/11/21. ?Called patient and reviewed medications and coordinated delivery. ? ?This delivery to  include: ? ?Fenofibrate 145 mg 1 tablet at bedtime ?Duloxetine 60 mg 2 tablets at breakfast ?Hydroxyurea 500 mg 2 tablets at bedtime  ?Metformin 1000 mg 1 tablet at breakfast and 1 tablet at bedtime. ?Amlodipine 5 mg 1 tablet at bedtime ?Lisinopril 5 mg 1 tablet at bedtime ?Atorvastatin 80 mg 1 tablet at bedtime ?[place] Sr 100 mg 1 tablet at breakfast and 1 tablet at bedtime ?Hctz 25 mg 1 tablet at breakfast ?Fish Oil-Omega 3 Fatty Acids 1000 mg 2 tablets at breakfast 2 tablets at bedtime. ?Aspirin 81 mg 1 tablet of 81 mg at breakfast  ? ? ?Patient needs refills for None. ? ?Confirmed delivery date of 09/11/21, advised patient that pharmacy will contact them the morning of delivery. ? ?Care Gaps ?  ?AWV: unknown  ?Colonoscopy: done 04/20/17 ?DM Eye Exam: overdue 09/07/18 ?DM Foot Exam:  due 02/19/22 ?Microalbumin: done 12/04/20 ?HbgAIC: done 02/19/21 (6.1) ?DEXA: unknown ?Mammogram:03/31/2017 ? ? ?Star Rating Drugs: ?atorvastatin (LIPITOR) 80 MG tablet - last filled 08/07/21 for 30 days ?lisinopril (ZESTRIL) 5 MG tablet - last filled 08/07/21 for 30 days  ?metFORMIN (GLUCOPHAGE) 1000 MG tablet - last filled 08/07/21 for 60 days ? ? ?Future Appointments  ?Date Time Provider [place]  ?09/11/2021  2:15 PM [facility] PHARMACIST [facility] None  ?09/12/2021  8:30 AM [facility] LAB [facility] None  ?09/12/2021  9:00 AM [name], MD [facility] None  ?09/12/2021  9:45 AM [facility] INFUSION [facility] None  ? ? ? ?Liza Showfety, CCMA ?Clinical Pharmacist Assistant  ?[phone] ? ? ?10 minutes spent in review, coordination, and documentation. ? ?Reviewed by: ?Beverly Milch, PharmD ?Clinical Pharmacist ?[phone] ? ? ?

## 2021-09-09 NOTE — Progress Notes (Deleted)
? ?Chronic Care Management ?Pharmacy Note ? ?09/09/2021 ?Name:  Katelyn Cline MRN:  [id number] DOB:  [birth year] ? ?Summary: ?FU visit.  Discussed dietary changes to help lipids including limiting TV dinner increasing fresh lean meats/veggies.  We discussed addition of Ozempic to her DM regimen - this could replace metformin and give her added weight loss benefit.  Recommended patient reschedule missed visit with PCP to address gaps in care as well as update labs (A1c, TSH, lipids). ? ?Subjective: ?Katelyn Cline is an 61 y.o. year old female who is a primary patient of Eulogio Bear, NP.  The CCM team was consulted for assistance with disease management and care coordination needs.   ? ?Engaged with patient face to face for follow up visit in response to provider referral for pharmacy case management and/or care coordination services.  ? ?Consent to Services:  ?The patient was given the following information about Chronic Care Management services today, agreed to services, and gave verbal consent: 1. CCM service includes personalized support from designated clinical staff supervised by the primary care provider, including individualized plan of care and coordination with other care providers 2. 24/7 contact phone numbers for assistance for urgent and routine care needs. 3. Service will only be billed when office clinical staff spend 20 minutes or more in a month to coordinate care. 4. Only one practitioner may furnish and bill the service in a calendar month. 5.The patient may stop CCM services at any time (effective at the end of the month) by phone call to the office staff. 6. The patient will be responsible for cost sharing (co-pay) of up to 20% of the service fee (after annual deductible is met). Patient agreed to services and consent obtained. ? ?Patient Care Team: ?Eulogio Bear, NP as PCP - General (Nurse Practitioner) ?[name], MD as Consulting Physician (Hematology) ?[name], [facility] as Pharmacist (Pharmacist) ? ?Recent office visits: ?06/18/20 [facility]) - AWV, unable to take Jardiance due to possible cause of polycythemia.  Referral to me for help with pill packing. ? ?Recent consult visits: ?None recent ? ?Hospital visits: ?None in previous 6 months ? ?Objective: ? ?Lab Results  ?Component Value Date  ? CREATININE 0.62 07/14/2021  ? BUN 20 07/14/2021  ? GFRNONAA >60 07/14/2021  ? GFRAA >60 01/12/2020  ? NA 138 07/14/2021  ? K 4.4 07/14/2021  ? CALCIUM 9.4 07/14/2021  ? CO2 29 07/14/2021  ? GLUCOSE 161 (H) 07/14/2021  ? ? ?Lab Results  ?Component Value Date/Time  ? HGBA1C 6.1 (H) 02/19/2021 11:52 AM  ? HGBA1C 7.1 (H) 06/18/2020 11:22 AM  ? MICROALBUR 9.3 06/18/2020 10:49 AM  ? MICROALBUR 2.6 10/07/2016 09:22 AM  ?  ?Last diabetic Eye exam:  ?Lab Results  ?Component Value Date/Time  ? HMDIABEYEEXA No Retinopathy 09/06/2017 12:00 AM  ?  ?Last diabetic Foot exam: No results found for: HMDIABFOOTEX  ? ?Lab Results  ?Component Value Date  ? CHOL 143 02/19/2021  ? HDL 35 (L) 02/19/2021  ? [place] 78 02/19/2021  ? LDLDIRECT 60 06/16/2019  ? TRIG 208 (H) 02/19/2021  ? CHOLHDL 4.1 02/19/2021  ? ? ?Hepatic Function Latest Ref Rng & Units 07/14/2021 02/24/2021 01/24/2021  ?Total Protein 6.5 - 8.1 g/dL 7.4 7.2 7.6  ?Albumin 3.5 - 5.0 g/dL 4.0 3.3(L) 3.6  ?AST 15 - 41 U/L 10(L) 11(L) 10(L)  ?ALT 0 - 44 U/L $Remo'10 11 8  'RPdRb$ ?Alk Phosphatase 38 - 126 U/L 119 99 110  ?Total Bilirubin  0.3 - 1.2 mg/dL 0.5 0.3 0.5  ? ? ?Lab Results  ?Component Value Date/Time  ? TSH 1.98 09/17/2015 08:52 AM  ? TSH 4.922 (H) 05/28/2015 08:58 AM  ? FREET4 1.2 09/17/2015 08:52 AM  ? FREET4 1.00 05/28/2015 08:58 AM  ? ? ?CBC Latest Ref Rng & Units 07/14/2021 02/24/2021 01/24/2021  ?WBC 4.0 - 10.5 K/uL 19.8(H) 11.8(H) 11.2(H)  ?Hemoglobin 12.0 - 15.0 g/dL 13.9 13.7 15.1(H)  ?Hematocrit 36.0 - 46.0 % 47.4(H) 45.5 50.0(H)  ?Platelets 150 - 400 K/uL 522(H) 394 209  ? ? ?Lab Results  ?Component Value Date/Time  ? VD25OH 28 (L)  12/02/2012 10:33 AM  ? VD25OH 15 (L) 10/05/2012 10:04 AM  ? ? ?Clinical ASCVD: Yes  ?The 10-year ASCVD risk score (Arnett DK, et al., 2019) is: 26.8% ?  Values used to calculate the score: ?    Age: 70 years ?    Sex: Female ?    Is Non-Hispanic African American: No ?    Diabetic: Yes ?    Tobacco smoker: Yes ?    Systolic Blood Pressure: 403 mmHg ?    Is BP treated: Yes ?    HDL Cholesterol: 35 mg/dL ?    Total Cholesterol: 143 mg/dL   ? ?Depression screen [facility] 2/9 02/19/2021 06/18/2020 10/16/2019  ?Decreased Interest 3 2 2   ?Down, Depressed, Hopeless 1 2 2   ?PHQ - 2 Score 4 4 4   ?Altered sleeping 2 1 2   ?Tired, decreased energy 3 1 2   ?Change in appetite 3 0 0  ?Feeling bad or failure about yourself  3 1 1   ?Trouble concentrating 2 1 2   ?Moving slowly or fidgety/restless 3 0 0  ?Suicidal thoughts 0 0 0  ?PHQ-9 Score 20 8 11   ?Difficult doing work/chores Very difficult Somewhat difficult Somewhat difficult  ?Some recent data might be hidden  ?  ? ? ? ?Social History  ? ?Tobacco Use  ?Smoking Status Every Day  ? Packs/day: 0.50  ? Years: 50.00  ? Pack years: 25.00  ? Types: Cigarettes  ?Smokeless Tobacco Never  ? ?BP Readings from Last 3 Encounters:  ?07/14/21 (!) 150/61  ?02/24/21 (!) 153/70  ?02/19/21 130/88  ? ?Pulse Readings from Last 3 Encounters:  ?07/14/21 82  ?02/24/21 75  ?02/19/21 78  ? ?Wt Readings from Last 3 Encounters:  ?07/14/21 221 lb 1.6 oz (100.3 kg)  ?02/19/21 227 lb 9.6 oz (103.2 kg)  ?01/24/21 231 lb 1.6 oz (104.8 kg)  ? ?BMI Readings from Last 3 Encounters:  ?07/14/21 33.62 kg/m?  ?02/19/21 34.61 kg/m?  ?01/24/21 35.14 kg/m?  ? ? ?Assessment/Interventions: Review of patient past medical history, allergies, medications, health status, including review of consultants reports, laboratory and other test data, was performed as part of comprehensive evaluation and provision of chronic care management services.  ? ?SDOH:  (Social Determinants of Health) assessments and interventions performed: Yes ?   ?Financial Resource Strain: Not on file  ? ? ?SDOH Screenings  ? ?Alcohol Screen: Not on file  ?Depression (PHQ2-9): Medium Risk  ? PHQ-2 Score: 20  ?Financial Resource Strain: Not on file  ?Food Insecurity: Not on file  ?Housing: Not on file  ?Physical Activity: Not on file  ?Social Connections: Not on file  ?Stress: Not on file  ?Tobacco Use: High Risk  ? Smoking Tobacco Use: Every Day  ? Smokeless Tobacco Use: Never  ? Passive Exposure: Not on file  ?Transportation Needs: Not on file  ? ? ?[place] ? ?  Allergies  ?Allergen Reactions  ? Contrast Media [Iodinated Contrast Media] Anaphylaxis  ? Erythromycin   ?  Stomach pain  ? Flagyl [Metronidazole] Other (See Comments)  ?  Generalized pain.  ? Latex Other (See Comments)  ?  Was told to be careful because of Dye allergy  ? Other   ?  Iodine Dye  ? Penicillins Other (See Comments)  ?  Patient was an infant, no idea of reaction. Tolerates Keflex. ?Did it involve swelling of the face/tongue/throat, SOB, or low BP? Unknown ?Did it involve sudden or severe rash/hives, skin peeling, or any reaction on the inside of your mouth or nose? Unknown ?Did you need to seek medical attention at a hospital or doctor's office? Unknown ?When did it last happen?      infant ?If all above answers are ?NO?, may proceed with cephalosporin use. ?  ? Tetracyclines & Related Other (See Comments)  ?  GI side effects  ? ? ?Medications Reviewed Today   ? ? Reviewed by Shawn Stall, RN (Registered Nurse) on 07/14/21 at [phone]  Med List Status: <None>  ? ?Medication Order Taking? Sig Documenting Provider Last Dose Status Informant  ?amLODipine (NORVASC) 5 MG tablet [id number] No TAKE 1 TABLET(5 MG) BY MOUTH DAILY Eulogio Bear, NP Taking Active   ?aspirin EC 81 MG tablet [id number] No Take 1 tablet (81 mg total) by mouth daily. Eulogio Bear, NP Taking Active   ?atorvastatin (LIPITOR) 80 MG tablet [id number] No TAKE 1 TABLET BY MOUTH DAILY AT 6 PM Eulogio Bear, NP Taking  Active   ?Blood Glucose Monitoring Suppl (BLOOD GLUCOSE SYSTEM PAK) KIT [id number] No Please dispense based on patient and insurance preference. Use as directed to monitor FSBS 2x daily. Dx: E11.9 [name], MD Cyndie Chime

## 2021-09-11 ENCOUNTER — Telehealth: Payer: Medicare HMO

## 2021-09-12 ENCOUNTER — Ambulatory Visit: Payer: Medicare HMO

## 2021-09-12 ENCOUNTER — Other Ambulatory Visit: Payer: Medicare HMO

## 2021-09-12 ENCOUNTER — Ambulatory Visit: Payer: Medicare HMO | Admitting: Hematology

## 2021-09-17 ENCOUNTER — Inpatient Hospital Stay: Payer: Medicare HMO

## 2021-09-17 ENCOUNTER — Other Ambulatory Visit: Payer: Self-pay

## 2021-09-17 ENCOUNTER — Inpatient Hospital Stay: Payer: Medicare HMO | Attending: Hematology

## 2021-09-17 ENCOUNTER — Inpatient Hospital Stay: Payer: Medicare HMO | Admitting: Hematology

## 2021-09-17 VITALS — BP 154/64 | HR 78 | Temp 97.9°F | Resp 18 | Wt 222.1 lb

## 2021-09-17 DIAGNOSIS — D471 Chronic myeloproliferative disease: Secondary | ICD-10-CM

## 2021-09-17 DIAGNOSIS — D45 Polycythemia vera: Secondary | ICD-10-CM | POA: Diagnosis not present

## 2021-09-17 LAB — CBC WITH DIFFERENTIAL/PLATELET
Abs Immature Granulocytes: 0.25 10*3/uL — ABNORMAL HIGH (ref 0.00–0.07)
Basophils Absolute: 0.2 10*3/uL — ABNORMAL HIGH (ref 0.0–0.1)
Basophils Relative: 1 %
Eosinophils Absolute: 0.6 10*3/uL — ABNORMAL HIGH (ref 0.0–0.5)
Eosinophils Relative: 3 %
HCT: 39.7 % (ref 36.0–46.0)
Hemoglobin: 11.8 g/dL — ABNORMAL LOW (ref 12.0–15.0)
Immature Granulocytes: 1 %
Lymphocytes Relative: 6 %
Lymphs Abs: 1.2 10*3/uL (ref 0.7–4.0)
MCH: 25.2 pg — ABNORMAL LOW (ref 26.0–34.0)
MCHC: 29.7 g/dL — ABNORMAL LOW (ref 30.0–36.0)
MCV: 84.6 fL (ref 80.0–100.0)
Monocytes Absolute: 0.7 10*3/uL (ref 0.1–1.0)
Monocytes Relative: 3 %
Neutro Abs: 18.2 10*3/uL — ABNORMAL HIGH (ref 1.7–7.7)
Neutrophils Relative %: 86 %
Platelets: 762 10*3/uL — ABNORMAL HIGH (ref 150–400)
RBC: 4.69 MIL/uL (ref 3.87–5.11)
RDW: 19.1 % — ABNORMAL HIGH (ref 11.5–15.5)
WBC: 21.2 10*3/uL — ABNORMAL HIGH (ref 4.0–10.5)
nRBC: 0.2 % (ref 0.0–0.2)

## 2021-09-17 LAB — IRON AND IRON BINDING CAPACITY (CC-WL,HP ONLY)
Iron: 23 ug/dL — ABNORMAL LOW (ref 28–170)
Saturation Ratios: 5 % — ABNORMAL LOW (ref 10.4–31.8)
TIBC: 438 ug/dL (ref 250–450)
UIBC: 415 ug/dL (ref 148–442)

## 2021-09-17 LAB — RETICULOCYTES
Immature Retic Fract: 35.2 % — ABNORMAL HIGH (ref 2.3–15.9)
RBC.: 4.62 MIL/uL (ref 3.87–5.11)
Retic Count, Absolute: 75.9 10*3/uL (ref 19.0–186.0)
Retic Ct Pct: 1.6 % (ref 0.4–3.1)

## 2021-09-17 LAB — VITAMIN B12: Vitamin B-12: 595 pg/mL (ref 180–914)

## 2021-09-17 LAB — FERRITIN: Ferritin: 93 ng/mL (ref 11–307)

## 2021-09-17 LAB — LACTATE DEHYDROGENASE: LDH: 280 U/L — ABNORMAL HIGH (ref 98–192)

## 2021-09-18 LAB — HAPTOGLOBIN: Haptoglobin: 290 mg/dL (ref 37–355)

## 2021-09-23 ENCOUNTER — Encounter: Payer: Self-pay | Admitting: Dietician

## 2021-09-23 NOTE — Progress Notes (Signed)
Attempted to contact patient for nutrition follow-up. Left VM with request for return call. Contact information provided.  ?

## 2021-09-24 ENCOUNTER — Encounter: Payer: Self-pay | Admitting: Hematology

## 2021-09-24 NOTE — Progress Notes (Addendum)
? ? ?HEMATOLOGY/ONCOLOGY CLINIC NOTE ? ?Date of Service: 09/17/2021 ? ? ?Patient Care Team: ?Eulogio Bear, NP as PCP - General (Nurse Practitioner) ?[name], MD as Consulting Physician (Hematology) ?[name], [facility] as Pharmacist (Pharmacist) ? ?CHIEF COMPLAINTS/PURPOSE OF CONSULTATION:  ?Follow-up for continued evaluation and management of JAK2 positive polycythemia vera ? ?HISTORY OF PRESENTING ILLNESS:  ?Please see previous note for details of initial presentation ? ?INTERVAL HISTORY:  ? ?Katelyn Cline is here for continued valuation and management of JAK2 positive polycythemia vera. ?She notes that she has cut down and was able to quit smoking over a month ago.  No fevers no chills no night sweats. ?No problems with tolerating her current dose of hydroxyurea. ?Labs done today were reviewed in detail with the patient. ? ? ?MEDICAL HISTORY:  ?Past Medical History:  ?Diagnosis Date  ? Agoraphobia   ? Arthritis   ? "all joints"  ? Chronic low back pain   ? Chronic respiratory failure with hypoxia ([name])   ? 4L  via [place],  followed by pcp,   (09-16-2018  per pt only uses while at home and at night ,  portable oxygen is not working)  ? COPD (chronic obstructive pulmonary disease) ([place])   ? DDD (degenerative disc disease), lumbosacral   ? Dental caries   ? DM type 2 (diabetes mellitus, type 2) ([place])   ? followed by pcp  ? Fibromyalgia   ? GERD (gastroesophageal reflux disease)   ? occasional , no meds  ? History of basal cell carcinoma (BCC) excision   ? s/p  Moh's of face/ nose 12/ 2013  ? History of palpitations   ? per pt found to have occaional PVCs  ? History of UTI   ? HTN (hypertension)   ? Hyperlipidemia   ? Loose, teeth   ? Major depression   ? Metastatic melanoma ([place]) 10/18/2012  ? 12 mm posterior right upper lobe pulmonary nodule, max SUV 3.0  s/p  right wedge resection 10-21-2012,  followed by oncologist-- dr Irene Limbo,  no recurrence  ? Mixed stress and urge urinary incontinence   ?  Myeloproliferative neoplasm ([place])   ? Narcolepsy   ? OSA (obstructive sleep apnea)   ? Oxygen dependent   ? 4 L via [place],  per pt portable oxygen not working but does use while at home and at night  ? Panic attacks   ? Periodontitis   ? Peripheral neuropathy   ? feet  ? Polycythemia vera(238.4) hematology/ oncology-- dr Irene Limbo ([facility])  ? 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 Pulmonary nodules   ? bilateral small stable per CT 03-11-2018  ? Scoliosis   ? Smokers' cough ([place])   ? per pt productive a little in the morning's  ? Symptoms of upper respiratory infection (URI) 03/12/2018  ? Wears glasses   ? ? ?SURGICAL HISTORY: ?Past Surgical History:  ?Procedure Laterality Date  ? ABDOMINAL HYSTERECTOMY  1998  ? BREAST LUMPECTOMY Bilateral 1995  ? benign per pt  ? COLONOSCOPY W/ BIOPSIES AND POLYPECTOMY    ? Hx: of  ? CYSTECTOMY  2000  ? abdominal wall   ? DILATION AND CURETTAGE OF UTERUS  yrs ago  ? MOHS SURGERY  2013  ? nose/face  ? MULTIPLE EXTRACTIONS WITH ALVEOLOPLASTY N/A 09/19/2018  ? Procedure: Extraction of tooth #'s 2, 3, 6-14, 18, and 20-30 with alveoloplasty;  Surgeon: Enrique Sack,  Pete Glatter, DDS;  Location: [facility];  Service: Oral Surgery;  Laterality: N/A;  GENERAL WITH NASAL TUBE  ? VIDEO ASSISTED THORACOSCOPY (VATS)/WEDGE RESECTION Right 10/21/2012  ? Procedure: VIDEO ASSISTED THORACOSCOPY (VATS)/WEDGE RESECTION;  Surgeon: [name], MD;  Location: [place];  Service: Thoracic;  Laterality: Right;  ? VIDEO BRONCHOSCOPY N/A 10/21/2012  ? Procedure: VIDEO BRONCHOSCOPY;  Surgeon: [name], MD;  Location: [place];  Service: Thoracic;  Laterality: N/A;  ? ? ?SOCIAL HISTORY: ?Social History  ? ?Socioeconomic History  ? Marital status: Divorced  ?  Spouse name: Not on file  ? Number of children: Not on file  ? Years of education: 12+  ? Highest education level: Not on file  ?Occupational History  ? Not on file  ?Tobacco Use   ? Smoking status: Every Day  ?  Packs/day: 0.50  ?  Years: 50.00  ?  Pack years: 25.00  ?  Types: Cigarettes  ? Smokeless tobacco: Never  ?Vaping Use  ? Vaping Use: Former  ? Quit date: 09/15/2016  ?Substance and Sexual Activity  ? Alcohol use: Yes  ?  Comment: occasional  ? Drug use: No  ? Sexual activity: Not on file  ?Other Topics Concern  ? Not on file  ?Social History Narrative  ? Not on file  ? ?Social Determinants of Health  ? ?Financial Resource Strain: Not on file  ?Food Insecurity: Not on file  ?Transportation Needs: Not on file  ?Physical Activity: Not on file  ?Stress: Not on file  ?Social Connections: Not on file  ?Intimate Partner Violence: Not on file  ? ? ?FAMILY HISTORY: ?Family History  ?Problem Relation Age of Onset  ? Arthritis Mother   ? Hyperlipidemia Mother   ? Depression Mother   ? Anxiety disorder Mother   ? Dementia Mother   ? Hypertension Father   ? Hyperlipidemia Father   ? Heart disease Father   ? Stroke Father   ? Dementia Father   ? Heart disease Brother   ? ADD / ADHD Son   ? Alcohol abuse Maternal Grandfather   ? Bipolar disorder Neg Hx   ? Drug abuse Neg Hx   ? OCD Neg Hx   ? Paranoid behavior Neg Hx   ? Schizophrenia Neg Hx   ? Seizures Neg Hx   ? Sexual abuse Neg Hx   ? Physical abuse Neg Hx   ? ? ?ALLERGIES:  is allergic to contrast media [iodinated contrast media], erythromycin, flagyl [metronidazole], latex, other, penicillins, and tetracyclines & related. ? ?MEDICATIONS:  ?Current Outpatient Medications  ?Medication Sig Dispense Refill  ? amLODipine (NORVASC) 5 MG tablet TAKE 1 TABLET(5 MG) BY MOUTH DAILY 90 tablet 1  ? aspirin EC 81 MG tablet Take 1 tablet (81 mg total) by mouth daily. 90 tablet 1  ? atorvastatin (LIPITOR) 80 MG tablet TAKE 1 TABLET BY MOUTH DAILY AT 6 PM 90 tablet 1  ? Blood Glucose Monitoring Suppl (BLOOD GLUCOSE SYSTEM PAK) KIT Please dispense based on patient and insurance preference. Use as directed to monitor FSBS 2x daily. Dx: E11.9 1 kit 1  ?  buPROPion ER (WELLBUTRIN SR) 100 MG 12 hr tablet TAKE 1 TABLET(100 MG) BY MOUTH TWICE DAILY 180 tablet 1  ? DULoxetine (CYMBALTA) 60 MG capsule TAKE TWO CAPSULES BY MOUTH EVERY MORNING 180 capsule 1  ? fenofibrate (TRICOR) 145 MG tablet TAKE ONE TABLET BY MOUTH EVERYDAY AT BEDTIME 90 tablet 1  ? fish oil-omega-3 fatty acids  1000 MG capsule Take 2 capsules (2 g total) by mouth 2 (two) times daily. 180 capsule 1  ? gabapentin (NEURONTIN) 100 MG capsule take 1-3 capsules BY MOUTH three times daily AS NEEDED FOR nerve pain 270 capsule 1  ? Glucose Blood (BLOOD GLUCOSE TEST STRIPS) STRP Please dispense based on patient and insurance preference. Use as directed to monitor FSBS 2x daily. Dx: E11.9 100 strip 1  ? hydrochlorothiazide (HYDRODIURIL) 25 MG tablet TAKE ONE TABLET BY MOUTH EVERY MORNING 90 tablet 1  ? hydroxyurea (HYDREA) 500 MG capsule TAKE TWO CAPSULES BY MOUTH EVERYDAY AT BEDTIME 90 capsule 3  ? lisinopril (ZESTRIL) 5 MG tablet TAKE 1 TABLET (5 MG) BY MOUTH AT BEDTIME 90 tablet 1  ? metFORMIN (GLUCOPHAGE) 1000 MG tablet Take 1 tablet (1,000 mg total) by mouth 2 (two) times daily with a meal. 180 tablet 1  ? Microlet Lancets MISC Please dispense based on patient and insurance preference. Use as directed to monitor FSBS 2x daily. Dx: E11.9 100 each 11  ? nystatin (MYCOSTATIN/NYSTOP) powder Apply 1 application topically 3 (three) times daily. PA Case: [id number] Approved 06/30/2019- 06/28/2020 60 g 2  ? nystatin cream (MYCOSTATIN) Apply 1 application topically 2 (two) times daily. To affected areas 60 g 2  ? oxyCODONE-acetaminophen (PERCOCET) 10-325 MG tablet Take 1 tablet by mouth 4 (four) times daily as needed for severe pain.    ? senna (SENOKOT) 8.6 MG TABS tablet Take 2 tablets by mouth daily as needed for mild constipation.    ? varenicline (CHANTIX) 1 MG tablet TAKE ONE TABLET BY MOUTH twice daily 60 tablet 3  ? ?No current facility-administered medications for this visit.  ? ? ?REVIEW OF SYSTEMS:   ?10 Point  review of Systems was done is negative except as noted above. ? ?PHYSICAL EXAMINATION: ?ECOG PERFORMANCE STATUS:3 ? ?Vitals:  ? 09/17/21 0930  ?BP: (!) 154/64  ?Pulse: 78  ?Resp: 18  ?Temp: 97.9 ?F (36.6 ?C)

## 2021-09-25 ENCOUNTER — Other Ambulatory Visit: Payer: Self-pay | Admitting: Nurse Practitioner

## 2021-09-29 ENCOUNTER — Telehealth: Payer: Self-pay | Admitting: Pharmacist

## 2021-09-29 NOTE — Progress Notes (Signed)
? ? ?Chronic Care Management ?Pharmacy Assistant  ? ?Name: Katelyn Cline  MRN: [id number] DOB: 11/01/1960 ? ? ?Reason for Encounter: Monthly Medication Coordination Call  ?  ? ?Medications: ?Outpatient Encounter Medications as of 09/29/2021  ?Medication Sig  ? amLODipine (NORVASC) 5 MG tablet TAKE 1 TABLET(5 MG) BY MOUTH DAILY  ? aspirin EC 81 MG tablet Take 1 tablet (81 mg total) by mouth daily.  ? atorvastatin (LIPITOR) 80 MG tablet TAKE 1 TABLET BY MOUTH DAILY AT 6 PM  ? Blood Glucose Monitoring Suppl (BLOOD GLUCOSE SYSTEM PAK) KIT Please dispense based on patient and insurance preference. Use as directed to monitor FSBS 2x daily. Dx: E11.9  ? buPROPion ER (WELLBUTRIN SR) 100 MG 12 hr tablet TAKE 1 TABLET(100 MG) BY MOUTH TWICE DAILY  ? DULoxetine (CYMBALTA) 60 MG capsule TAKE TWO CAPSULES BY MOUTH EVERY MORNING  ? fenofibrate (TRICOR) 145 MG tablet TAKE ONE TABLET BY MOUTH EVERYDAY AT BEDTIME  ? fish oil-omega-3 fatty acids 1000 MG capsule Take 2 capsules (2 g total) by mouth 2 (two) times daily.  ? gabapentin (NEURONTIN) 100 MG capsule take 1-3 capsules BY MOUTH three times daily AS NEEDED FOR nerve pain  ? Glucose Blood (BLOOD GLUCOSE TEST STRIPS) STRP Please dispense based on patient and insurance preference. Use as directed to monitor FSBS 2x daily. Dx: E11.9  ? hydrochlorothiazide (HYDRODIURIL) 25 MG tablet TAKE ONE TABLET BY MOUTH EVERY MORNING  ? hydroxyurea (HYDREA) 500 MG capsule TAKE TWO CAPSULES BY MOUTH EVERYDAY AT BEDTIME  ? lisinopril (ZESTRIL) 5 MG tablet TAKE 1 TABLET (5 MG) BY MOUTH AT BEDTIME  ? metFORMIN (GLUCOPHAGE) 1000 MG tablet Take 1 tablet (1,000 mg total) by mouth 2 (two) times daily with a meal.  ? Microlet Lancets MISC Please dispense based on patient and insurance preference. Use as directed to monitor FSBS 2x daily. Dx: E11.9  ? nystatin (MYCOSTATIN/NYSTOP) powder Apply 1 application topically 3 (three) times daily. PA Case: [id number] Approved 06/30/2019- 06/28/2020  ? nystatin cream  (MYCOSTATIN) Apply 1 application topically 2 (two) times daily. To affected areas  ? oxyCODONE-acetaminophen (PERCOCET) 10-325 MG tablet Take 1 tablet by mouth 4 (four) times daily as needed for severe pain.  ? senna (SENOKOT) 8.6 MG TABS tablet Take 2 tablets by mouth daily as needed for mild constipation.  ? varenicline (CHANTIX) 1 MG tablet TAKE ONE TABLET BY MOUTH twice daily  ? ?No facility-administered encounter medications on file as of 09/29/2021.  ? ?Reviewed chart for medication changes ahead of medication coordination call. ? ?No OVs, Consults, or hospital visits since last care coordination call/Pharmacist visit. (If appropriate, list visit date, provider name) ? ?No medication changes indicated OR if recent visit, treatment plan here. ? ?BP Readings from Last 3 Encounters:  ?09/17/21 (!) 154/64  ?07/14/21 (!) 150/61  ?02/24/21 (!) 153/70  ?  ?Lab Results  ?Component Value Date  ? HGBA1C 6.1 (H) 02/19/2021  ?  ? ?Patient obtains medications through Adherence Packaging  30 Days  ? ?Last adherence delivery included: (medication name and frequency) ? ?Fenofibrate 145 mg 1 tablet at bedtime ?Duloxetine 60 mg 2 tablets at breakfast ?Hydroxyurea 500 mg 2 tablets at bedtime  ?Metformin 1000 mg 1 tablet at breakfast and 1 tablet at bedtime. ?Amlodipine 5 mg 1 tablet at bedtime ?Lisinopril 5 mg 1 tablet at bedtime ?Atorvastatin 80 mg 1 tablet at bedtime ?[place] Sr 100 mg 1 tablet at breakfast and 1 tablet at bedtime ?Hctz 25 mg 1 tablet at breakfast ?  Fish Oil-Omega 3 Fatty Acids 1000 mg 2 tablets at breakfast 2 tablets at bedtime. ?Aspirin 81 mg 1 tablet of 81 mg at breakfast  ?  ? ?Patient is due for next adherence delivery on: 10/10/21. ?Called patient and reviewed medications and coordinated delivery. ? ?This delivery to include: ? ?Fenofibrate 145 mg 1 tablet at bedtime ?Duloxetine 60 mg 2 tablets at breakfast ?Hydroxyurea 500 mg 2 tablets at bedtime  ?Metformin 1000 mg 1 tablet at breakfast and 1 tablet at  bedtime. ?Amlodipine 5 mg 1 tablet at bedtime ?Lisinopril 5 mg 1 tablet at bedtime ?Atorvastatin 80 mg 1 tablet at bedtime ?[place] Sr 100 mg 1 tablet at breakfast and 1 tablet at bedtime ?Hctz 25 mg 1 tablet at breakfast ?Fish Oil-Omega 3 Fatty Acids 1000 mg 2 tablets at breakfast 2 tablets at bedtime. ?Aspirin 81 mg 1 tablet of 81 mg at breakfast  ?Gabapentin 156m 1-3 capsules three times daily ?  ? ?Patient needs refills for (from PCP) for 30 day supply ? ?Fenofibrate 145 mg 1 tablet at bedtime ?Duloxetine 60 mg 2 tablets at breakfast ?Metformin 1000 mg 1 tablet at breakfast and 1 tablet at bedtime. ?Amlodipine 5 mg 1 tablet at bedtime ?Lisinopril 5 mg 1 tablet at bedtime ?Atorvastatin 80 mg 1 tablet at bedtime ?[place] Sr 100 mg 1 tablet at breakfast and 1 tablet at bedtime ?Hctz 25 mg 1 tablet at breakfast ?Aspirin 81 mg 1 tablet daily  ?Gabapentin 100 mg 1-3 capsules three times daily ? ? ?Confirmed delivery date of 10/10/21, advised patient that pharmacy will contact them the morning of delivery. ? ? ? ?Care Gaps ?  ?AWV: unknown  ?Colonoscopy: done 04/20/17 ?DM Eye Exam: overdue 09/07/18 ?DM Foot Exam:  due 02/19/22 ?Microalbumin: done 12/04/20 ?HbgAIC: done 02/19/21 (6.1) ?DEXA: unknown ?Mammogram:03/31/2017 ?  ?  ?Star Rating Drugs: ?atorvastatin (LIPITOR) 80 MG tablet - last filled 09/05/21 for 30 days ?lisinopril (ZESTRIL) 5 MG tablet - last filled 09/05/21 for 30 days  ?metFORMIN (GLUCOPHAGE) 1000 MG tablet - last filled 09/05/21 for 60 days ? ? ?Future Appointments  ?Date Time Provider [place] ?10/29/2021  9:30 AM [facility] None  ?10/29/2021 10:00 AM [facility] Genera [facility]  ? ? ?Liza Showfety, CCMA ?Clinical Pharmacist Assistant  ?[phone]? ? ?

## 2021-09-29 NOTE — Telephone Encounter (Signed)
Reviewed by: ?[place] ?CMA/PTM ?[phone] ?

## 2021-10-15 ENCOUNTER — Other Ambulatory Visit: Payer: Self-pay | Admitting: Family Medicine

## 2021-10-16 ENCOUNTER — Encounter (HOSPITAL_COMMUNITY): Payer: Self-pay | Admitting: Nurse Practitioner

## 2021-10-29 ENCOUNTER — Other Ambulatory Visit: Payer: Self-pay

## 2021-10-29 ENCOUNTER — Inpatient Hospital Stay: Payer: Medicare HMO | Attending: Hematology

## 2021-10-29 ENCOUNTER — Inpatient Hospital Stay: Payer: Medicare HMO | Admitting: Hematology

## 2021-10-29 ENCOUNTER — Other Ambulatory Visit: Payer: Self-pay | Admitting: Hematology

## 2021-10-29 DIAGNOSIS — D45 Polycythemia vera: Secondary | ICD-10-CM

## 2021-10-29 DIAGNOSIS — D471 Chronic myeloproliferative disease: Secondary | ICD-10-CM

## 2021-10-29 LAB — CMP (CANCER CENTER ONLY)
ALT: 17 U/L (ref 0–44)
AST: 18 U/L (ref 15–41)
Albumin: 3.6 g/dL (ref 3.5–5.0)
Alkaline Phosphatase: 127 U/L — ABNORMAL HIGH (ref 38–126)
Anion gap: 9 (ref 5–15)
BUN: 20 mg/dL (ref 8–23)
CO2: 28 mmol/L (ref 22–32)
Calcium: 9.1 mg/dL (ref 8.9–10.3)
Chloride: 103 mmol/L (ref 98–111)
Creatinine: 0.83 mg/dL (ref 0.44–1.00)
GFR, Estimated: >60 mL/min (ref >60)
Glucose, Bld: 200 mg/dL — ABNORMAL HIGH (ref 70–99)
Potassium: 4.4 mmol/L (ref 3.5–5.1)
Sodium: 140 mmol/L (ref 135–145)
Total Bilirubin: 0.5 mg/dL (ref 0.3–1.2)
Total Protein: 7.8 g/dL (ref 6.5–8.1)

## 2021-10-29 LAB — CBC WITH DIFFERENTIAL/PLATELET
Abs Immature Granulocytes: 0.36 10*3/uL — ABNORMAL HIGH (ref 0.00–0.07)
Basophils Absolute: 0.3 10*3/uL — ABNORMAL HIGH (ref 0.0–0.1)
Basophils Relative: 1 %
Eosinophils Absolute: 0.6 10*3/uL — ABNORMAL HIGH (ref 0.0–0.5)
Eosinophils Relative: 3 %
HCT: 45.5 % (ref 36.0–46.0)
Hemoglobin: 13.2 g/dL (ref 12.0–15.0)
Immature Granulocytes: 2 %
Lymphocytes Relative: 8 %
Lymphs Abs: 1.5 10*3/uL (ref 0.7–4.0)
MCH: 25.4 pg — ABNORMAL LOW (ref 26.0–34.0)
MCHC: 29 g/dL — ABNORMAL LOW (ref 30.0–36.0)
MCV: 87.5 fL (ref 80.0–100.0)
Monocytes Absolute: 0.6 10*3/uL (ref 0.1–1.0)
Monocytes Relative: 3 %
Neutro Abs: 17.2 10*3/uL — ABNORMAL HIGH (ref 1.7–7.7)
Neutrophils Relative %: 83 %
Platelets: 628 10*3/uL — ABNORMAL HIGH (ref 150–400)
RBC: 5.2 MIL/uL — ABNORMAL HIGH (ref 3.87–5.11)
RDW: 21.2 % — ABNORMAL HIGH (ref 11.5–15.5)
WBC: 20.6 10*3/uL — ABNORMAL HIGH (ref 4.0–10.5)
nRBC: 0.4 % — ABNORMAL HIGH (ref 0.0–0.2)

## 2021-10-29 MED ORDER — HYDROXYUREA 500 MG PO CAPS: ORAL_CAPSULE | ORAL | 3 refills | Status: DC

## 2021-10-29 MED ORDER — AMLODIPINE BESYLATE 5 MG PO TABS: ORAL_TABLET | ORAL | 0 refills | Status: DC

## 2021-10-30 ENCOUNTER — Telehealth (HOSPITAL_COMMUNITY): Payer: Self-pay | Admitting: Nurse Practitioner

## 2021-10-30 NOTE — Telephone Encounter (Signed)
Just an FYI. ?We have made several attempts to contact this patient including sending a letter to schedule or reschedule their echocardiogram. We will be removing the patient from the echo WQ. ? ?10/16/21 MAILED LETTER [facility]  ?10/13/21 LMOM x2 @ 11:20/[facility]  ?10/08/21 LMOM @ 9:46/[facility]  ? ? ? ?Thank you  ?

## 2021-11-04 ENCOUNTER — Encounter: Payer: Self-pay | Admitting: Hematology

## 2021-11-04 NOTE — Progress Notes (Signed)
? ? ?HEMATOLOGY/ONCOLOGY CLINIC NOTE ? ?Date of Service: 10/29/2021 ? ? ?Patient Care Team: ?Pcp, No as PCP - General ?[name], MD as Consulting Physician (Hematology) ?[name], [facility] as Pharmacist (Pharmacist) ? ?CHIEF COMPLAINTS/PURPOSE OF CONSULTATION:  ?Evaluation and management of polycythemia vera ? ?HISTORY OF PRESENTING ILLNESS:  ?Please see previous note for details of initial presentation ? ?INTERVAL HISTORY:  ? ?Katelyn Cline is here for continued evaluation and management of her polycythemia vera.  She notes that she has cut down her cigarette smoking quite significantly since her last clinic visit.  No issues with tolerating her current dose of hydroxyurea.  Notes that she has been more or less 100% compliant with her medications. ?No infection issues since her last clinic visit. ?Labs done today were reviewed in detail with the patient. ? ?MEDICAL HISTORY:  ?Past Medical History:  ?Diagnosis Date  ? Agoraphobia   ? Arthritis   ? "all joints"  ? Chronic low back pain   ? Chronic respiratory failure with hypoxia ([name])   ? 4L  via [place],  followed by pcp,   (09-16-2018  per pt only uses while at home and at night ,  portable oxygen is not working)  ? COPD (chronic obstructive pulmonary disease) ([place])   ? DDD (degenerative disc disease), lumbosacral   ? Dental caries   ? DM type 2 (diabetes mellitus, type 2) ([name])   ? followed by pcp  ? Fibromyalgia   ? GERD (gastroesophageal reflux disease)   ? occasional , no meds  ? History of basal cell carcinoma (BCC) excision   ? s/p  Moh's of face/ nose 12/ 2013  ? History of palpitations   ? per pt found to have occaional PVCs  ? History of UTI   ? HTN (hypertension)   ? Hyperlipidemia   ? Loose, teeth   ? Major depression   ? Metastatic melanoma ([place]) 10/18/2012  ? 12 mm posterior right upper lobe pulmonary nodule, max SUV 3.0  s/p  right wedge resection 10-21-2012,  followed by oncologist-- dr Irene Limbo,  no recurrence  ? Mixed stress and urge  urinary incontinence   ? Myeloproliferative neoplasm ([place])   ? Narcolepsy   ? OSA (obstructive sleep apnea)   ? Oxygen dependent   ? 4 L via [place],  per pt portable oxygen not working but does use while at home and at night  ? Panic attacks   ? Periodontitis   ? Peripheral neuropathy   ? feet  ? Polycythemia vera(238.4) hematology/ oncology-- dr Irene Limbo ([facility])  ? 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 Pulmonary nodules   ? bilateral small stable per CT 03-11-2018  ? Scoliosis   ? Smokers' cough ([place])   ? per pt productive a little in the morning's  ? Symptoms of upper respiratory infection (URI) 03/12/2018  ? Wears glasses   ? ? ?SURGICAL HISTORY: ?Past Surgical History:  ?Procedure Laterality Date  ? ABDOMINAL HYSTERECTOMY  1998  ? BREAST LUMPECTOMY Bilateral 1995  ? benign per pt  ? COLONOSCOPY W/ BIOPSIES AND POLYPECTOMY    ? Hx: of  ? CYSTECTOMY  2000  ? abdominal wall   ? DILATION AND CURETTAGE OF UTERUS  yrs ago  ? MOHS SURGERY  2013  ? nose/face  ? MULTIPLE EXTRACTIONS WITH ALVEOLOPLASTY N/A 09/19/2018  ? Procedure: Extraction of tooth #'s 2, 3, 6-14, 18, and 20-30  with alveoloplasty;  Surgeon: [name], DDS;  Location: [facility];  Service: Oral Surgery;  Laterality: N/A;  GENERAL WITH NASAL TUBE  ? VIDEO ASSISTED THORACOSCOPY (VATS)/WEDGE RESECTION Right 10/21/2012  ? Procedure: VIDEO ASSISTED THORACOSCOPY (VATS)/WEDGE RESECTION;  Surgeon: [name], MD;  Location: [place];  Service: Thoracic;  Laterality: Right;  ? VIDEO BRONCHOSCOPY N/A 10/21/2012  ? Procedure: VIDEO BRONCHOSCOPY;  Surgeon: [name], MD;  Location: [place];  Service: Thoracic;  Laterality: N/A;  ? ? ?SOCIAL HISTORY: ?Social History  ? ?Socioeconomic History  ? Marital status: Divorced  ?  Spouse name: Not on file  ? Number of children: Not on file  ? Years of education: 12+  ? Highest education level: Not on file  ?Occupational History  ?  Not on file  ?Tobacco Use  ? Smoking status: Every Day  ?  Packs/day: 0.50  ?  Years: 50.00  ?  Pack years: 25.00  ?  Types: Cigarettes  ? Smokeless tobacco: Never  ?Vaping Use  ? Vaping Use: Former  ? Quit date: 09/15/2016  ?Substance and Sexual Activity  ? Alcohol use: Yes  ?  Comment: occasional  ? Drug use: No  ? Sexual activity: Not on file  ?Other Topics Concern  ? Not on file  ?Social History Narrative  ? Not on file  ? ?Social Determinants of Health  ? ?Financial Resource Strain: Not on file  ?Food Insecurity: Not on file  ?Transportation Needs: Not on file  ?Physical Activity: Not on file  ?Stress: Not on file  ?Social Connections: Not on file  ?Intimate Partner Violence: Not on file  ? ? ?FAMILY HISTORY: ?Family History  ?Problem Relation Age of Onset  ? Arthritis Mother   ? Hyperlipidemia Mother   ? Depression Mother   ? Anxiety disorder Mother   ? Dementia Mother   ? Hypertension Father   ? Hyperlipidemia Father   ? Heart disease Father   ? Stroke Father   ? Dementia Father   ? Heart disease Brother   ? ADD / ADHD Son   ? Alcohol abuse Maternal Grandfather   ? Bipolar disorder Neg Hx   ? Drug abuse Neg Hx   ? OCD Neg Hx   ? Paranoid behavior Neg Hx   ? Schizophrenia Neg Hx   ? Seizures Neg Hx   ? Sexual abuse Neg Hx   ? Physical abuse Neg Hx   ? ? ?ALLERGIES:  is allergic to contrast media [iodinated contrast media], erythromycin, flagyl [metronidazole], latex, other, penicillins, and tetracyclines & related. ? ?MEDICATIONS:  ?Current Outpatient Medications  ?Medication Sig Dispense Refill  ? amLODipine (NORVASC) 5 MG tablet TAKE 1 TABLET(5 MG) BY MOUTH DAILY 90 tablet 1  ? aspirin EC 81 MG tablet Take 1 tablet (81 mg total) by mouth daily. 90 tablet 1  ? atorvastatin (LIPITOR) 80 MG tablet TAKE 1 TABLET BY MOUTH DAILY AT 6 PM 90 tablet 1  ? Blood Glucose Monitoring Suppl (BLOOD GLUCOSE SYSTEM PAK) KIT Please dispense based on patient and insurance preference. Use as directed to monitor FSBS 2x daily. Dx:  E11.9 1 kit 1  ? buPROPion ER (WELLBUTRIN SR) 100 MG 12 hr tablet TAKE 1 TABLET(100 MG) BY MOUTH TWICE DAILY 180 tablet 1  ? DULoxetine (CYMBALTA) 60 MG capsule TAKE TWO CAPSULES BY MOUTH EVERY MORNING 180 capsule 1  ? fenofibrate (TRICOR) 145 MG tablet TAKE ONE TABLET BY MOUTH EVERYDAY AT BEDTIME 90 tablet 1  ?  fish oil-omega-3 fatty acids 1000 MG capsule Take 2 capsules (2 g total) by mouth 2 (two) times daily. 180 capsule 1  ? gabapentin (NEURONTIN) 100 MG capsule take 1-3 capsules BY MOUTH three times daily AS NEEDED FOR nerve pain 270 capsule 1  ? Glucose Blood (BLOOD GLUCOSE TEST STRIPS) STRP Please dispense based on patient and insurance preference. Use as directed to monitor FSBS 2x daily. Dx: E11.9 100 strip 1  ? hydrochlorothiazide (HYDRODIURIL) 25 MG tablet TAKE ONE TABLET BY MOUTH EVERY MORNING 90 tablet 1  ? hydroxyurea (HYDREA) 500 MG capsule Take 3 capsules (1500 mg ) PO on Monday and Thursday and take 2 capsules (1000 mg) Tuesday Wednesday Friday Saturday Sunday. May take with food to minimize GI side effects. 90 capsule 3  ? lisinopril (ZESTRIL) 5 MG tablet TAKE 1 TABLET (5 MG) BY MOUTH AT BEDTIME 90 tablet 1  ? metFORMIN (GLUCOPHAGE) 1000 MG tablet Take 1 tablet (1,000 mg total) by mouth 2 (two) times daily with a meal. 180 tablet 1  ? Microlet Lancets MISC Please dispense based on patient and insurance preference. Use as directed to monitor FSBS 2x daily. Dx: E11.9 100 each 11  ? nystatin (MYCOSTATIN/NYSTOP) powder Apply 1 application topically 3 (three) times daily. PA Case: [id number] Approved 06/30/2019- 06/28/2020 60 g 2  ? nystatin cream (MYCOSTATIN) Apply 1 application topically 2 (two) times daily. To affected areas 60 g 2  ? oxyCODONE-acetaminophen (PERCOCET) 10-325 MG tablet Take 1 tablet by mouth 4 (four) times daily as needed for severe pain.    ? senna (SENOKOT) 8.6 MG TABS tablet Take 2 tablets by mouth daily as needed for mild constipation.    ? varenicline (CHANTIX) 1 MG tablet TAKE ONE  TABLET BY MOUTH twice daily 60 tablet 3  ? ?No current facility-administered medications for this visit.  ? ? ?REVIEW OF SYSTEMS:   ?10 Point review of Systems was done is negative except as noted abov

## 2021-11-14 ENCOUNTER — Ambulatory Visit (INDEPENDENT_AMBULATORY_CARE_PROVIDER_SITE_OTHER): Payer: Medicare HMO | Admitting: Family Medicine

## 2021-11-14 ENCOUNTER — Encounter: Payer: Self-pay | Admitting: Family Medicine

## 2021-11-14 VITALS — BP 140/80 | HR 77 | Temp 97.6°F | Ht 68.0 in | Wt 223.2 lb

## 2021-11-14 DIAGNOSIS — F331 Major depressive disorder, recurrent, moderate: Secondary | ICD-10-CM

## 2021-11-14 DIAGNOSIS — Z23 Encounter for immunization: Secondary | ICD-10-CM

## 2021-11-14 DIAGNOSIS — I10 Essential (primary) hypertension: Secondary | ICD-10-CM

## 2021-11-14 DIAGNOSIS — Z85820 Personal history of malignant melanoma of skin: Secondary | ICD-10-CM

## 2021-11-14 DIAGNOSIS — E1140 Type 2 diabetes mellitus with diabetic neuropathy, unspecified: Secondary | ICD-10-CM

## 2021-11-14 DIAGNOSIS — G894 Chronic pain syndrome: Secondary | ICD-10-CM

## 2021-11-14 DIAGNOSIS — E559 Vitamin D deficiency, unspecified: Secondary | ICD-10-CM

## 2021-11-14 DIAGNOSIS — Z1211 Encounter for screening for malignant neoplasm of colon: Secondary | ICD-10-CM

## 2021-11-14 DIAGNOSIS — J9611 Chronic respiratory failure with hypoxia: Secondary | ICD-10-CM

## 2021-11-14 DIAGNOSIS — E7800 Pure hypercholesterolemia, unspecified: Secondary | ICD-10-CM

## 2021-11-14 DIAGNOSIS — D45 Polycythemia vera: Secondary | ICD-10-CM

## 2021-11-14 LAB — LIPID PANEL
Cholesterol: 156 mg/dL (ref 0–200)
HDL: 35.3 mg/dL — ABNORMAL LOW (ref >39.00)
NonHDL: 120.48
Total CHOL/HDL Ratio: 4
Triglycerides: 215 mg/dL — ABNORMAL HIGH (ref 0.0–149.0)
VLDL: 43 mg/dL — ABNORMAL HIGH (ref 0.0–40.0)

## 2021-11-14 LAB — HEMOGLOBIN A1C: Hgb A1c MFr Bld: 6.6 % — ABNORMAL HIGH (ref 4.6–6.5)

## 2021-11-14 LAB — MICROALBUMIN / CREATININE URINE RATIO
Creatinine,U: 53.1 mg/dL
Microalb Creat Ratio: 7.3 mg/g (ref 0.0–30.0)
Microalb, Ur: 3.9 mg/dL — ABNORMAL HIGH (ref 0.0–1.9)

## 2021-11-14 LAB — URIC ACID: Uric Acid, Serum: 6.6 mg/dL (ref 2.4–7.0)

## 2021-11-14 LAB — VITAMIN D 25 HYDROXY (VIT D DEFICIENCY, FRACTURES): VITD: 19.42 ng/mL — ABNORMAL LOW (ref 30.00–100.00)

## 2021-11-14 LAB — LDL CHOLESTEROL, DIRECT: Direct LDL: 95 mg/dL

## 2021-11-14 LAB — MAGNESIUM: Magnesium: 2 mg/dL (ref 1.5–2.5)

## 2021-11-14 LAB — TSH: TSH: 3.22 u[IU]/mL (ref 0.35–5.50)

## 2021-11-14 MED ORDER — DULOXETINE HCL 60 MG PO CPEP: ORAL_CAPSULE | ORAL | 1 refills | Status: DC

## 2021-11-14 MED ORDER — BUPROPION HCL ER (SR) 100 MG PO TB12: ORAL_TABLET | ORAL | 1 refills | Status: DC

## 2021-11-14 MED ORDER — LISINOPRIL 5 MG PO TABS: ORAL_TABLET | ORAL | 1 refills | Status: DC

## 2021-11-14 MED ORDER — METFORMIN HCL 1000 MG PO TABS: 1000.0000 mg | ORAL_TABLET | Freq: Two times a day (BID) | ORAL | 1 refills | Status: DC

## 2021-11-14 MED ORDER — NYSTATIN 100000 UNIT/GM EX CREA: 1.0000 "application " | TOPICAL_CREAM | Freq: Two times a day (BID) | CUTANEOUS | 2 refills | Status: DC

## 2021-11-14 MED ORDER — HYDROCHLOROTHIAZIDE 25 MG PO TABS: ORAL_TABLET | ORAL | 1 refills | Status: DC

## 2021-11-14 MED ORDER — AMLODIPINE BESYLATE 5 MG PO TABS: ORAL_TABLET | ORAL | 1 refills | Status: DC

## 2021-11-14 MED ORDER — ATORVASTATIN CALCIUM 80 MG PO TABS: ORAL_TABLET | ORAL | 1 refills | Status: DC

## 2021-11-14 MED ORDER — FENOFIBRATE 145 MG PO TABS
ORAL_TABLET | ORAL | 1 refills | Status: DC
Start: 2021-11-14 — End: 2022-04-30

## 2021-11-14 NOTE — Progress Notes (Signed)
New Patient Office Visit  Subjective:  Patient ID: Katelyn Cline, female    DOB: 10-26-1960  Age: 61 y.o. MRN: [id number]  CC:  Chief Complaint  Patient presents with   Establish Care    Need new PCP Fasting     HPI Katelyn Cline presents for new pt-provider left Narcolepsy - xport problems and agoraphobia so not seen by PCP for almost 1 yr   Chronic pain-mult arth, back neck-managed by pain clinic. Pain in legs when walking sometimes.  No ABI but going on very long time and nothing new. Neuropathy in feet. Cramps at times. R foot can swell at times and red and hot and lasts about 2 wks at a time.  PV-on hydrea.  Managed by heme. Itching a lot all over.  Gets phelebotomies at times.  Hypoxia-on O2. No Pulm.   Still smoking.  Had lung resection in past-metastatic melanoma(primary never found). Back to smoking.  Depression-on cymbalta(which on for neuropathy).  Wellbutrin as well.  Doing well on this regimen.  No SI. Some agoraphobia.  Service dog in past-passed and pt having hard time getting out.  Lives w/son.  DM type 2-on metformin.  Occ checks sugars-150.   HTN-no cuff but pain clinic monthly and usu ok till recently.  Up some, but out of meds as no PCP.  No new vertigo   no cp.  Occ extra heartbeat.      Past Medical History:  Diagnosis Date   Agoraphobia    Arthritis    "all joints"   Chronic low back pain    Chronic respiratory failure with hypoxia ([name])    4L  via [place],  followed by pcp,   (09-16-2018  per pt only uses while at home and at night ,  portable oxygen is not working)   Clotting disorder ([place]) 2013   Polycythemia vera   COPD (chronic obstructive pulmonary disease) ([place])    DDD (degenerative disc disease), lumbosacral    Dental caries    DM type 2 (diabetes mellitus, type 2) ([place])    followed by pcp   Fibromyalgia    GERD (gastroesophageal reflux disease)    occasional , no meds   Heart murmur 2020   History of basal cell carcinoma (BCC) excision     s/p  Moh's of face/ nose 12/ 2013   History of palpitations    per pt found to have occaional PVCs   History of UTI    HTN (hypertension)    Hyperlipidemia    Loose, teeth    Major depression    Metastatic melanoma ([place]) 10/18/2012   12 mm posterior right upper lobe pulmonary nodule, max SUV 3.0  s/p  right wedge resection 10-21-2012,  followed by oncologist-- dr Irene Limbo,  no recurrence   Mixed stress and urge urinary incontinence    Myeloproliferative neoplasm ([place])    Narcolepsy    OSA (obstructive sleep apnea)    Oxygen deficiency    Oxygen dependent    4 L via [place],  per pt portable oxygen not working but does use while at home and at night   Panic attacks    Periodontitis    Peripheral neuropathy    feet   Polycythemia vera(238.4) hematology/ oncology-- dr Irene Limbo ([facility])   first dx 02014 due to smoker/copd--  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)  hx phlebotomies   Pulmonary nodules    bilateral small stable per CT 03-11-2018   Scoliosis  Sleep apnea    Smokers' cough ([place])    per pt productive a little in the morning's   Symptoms of upper respiratory infection (URI) 03/12/2018   Wears glasses     Past Surgical History:  Procedure Laterality Date   ABDOMINAL HYSTERECTOMY  1998   partial   BREAST LUMPECTOMY Bilateral 1995   benign per pt   COLONOSCOPY W/ BIOPSIES AND POLYPECTOMY     Hx: of   CYSTECTOMY  2000   abdominal wall    DILATION AND CURETTAGE OF UTERUS  yrs ago   MOHS SURGERY  2013   nose/face   MULTIPLE EXTRACTIONS WITH ALVEOLOPLASTY N/A 09/19/2018   Procedure: Extraction of tooth #'s 2, 3, 6-14, 18, and 20-30 with alveoloplasty;  Surgeon: [name], DDS;  Location: [facility];  Service: Oral Surgery;  Laterality: N/A;  GENERAL WITH NASAL TUBE   VIDEO ASSISTED THORACOSCOPY (VATS)/WEDGE RESECTION Right 10/21/2012   Procedure: VIDEO ASSISTED THORACOSCOPY (VATS)/WEDGE RESECTION;  Surgeon: [name], MD;  Location:  [facility];  Service: Thoracic;  Laterality: Right;   VIDEO BRONCHOSCOPY N/A 10/21/2012   Procedure: VIDEO BRONCHOSCOPY;  Surgeon: [name], MD;  Location: [facility] OR;  Service: Thoracic;  Laterality: N/A;    Family History  Problem Relation Age of Onset   Arthritis Mother    Hyperlipidemia Mother    Depression Mother    Anxiety disorder Mother    Dementia Mother    Hypertension Father    Hyperlipidemia Father    Heart disease Father    Stroke Father    Dementia Father    Heart disease Brother    ADD / ADHD Son    Alcohol abuse Maternal Grandfather    Bipolar disorder Neg Hx    Drug abuse Neg Hx    OCD Neg Hx    Paranoid behavior Neg Hx    Schizophrenia Neg Hx    Seizures Neg Hx    Sexual abuse Neg Hx    Physical abuse Neg Hx     Social History   Socioeconomic History   Marital status: Divorced    Spouse name: Not on file   Number of children: 1   Years of education: 12+   Highest education level: Not on file  Occupational History   Not on file  Tobacco Use   Smoking status: Every Day    Packs/day: 0.50    Years: 50.00    Pack years: 25.00    Types: Cigarettes   Smokeless tobacco: Never   Tobacco comments:    Quit a few times  Vaping Use   Vaping Use: Former   Quit date: 09/15/2016  Substance and Sexual Activity   Alcohol use: Yes    Alcohol/week: 1.0 standard drink    Types: 1 Standard drinks or equivalent per week    Comment: Not weekly   Drug use: Never   Sexual activity: Not Currently    Birth control/protection: Surgical  Other Topics Concern   Not on file  Social History Narrative   Son-lives w/son.   disabled   Social Determinants of Radio broadcast assistant Strain: Not on file  Food Insecurity: Not on file  Transportation Needs: Not on file  Physical Activity: Not on file  Stress: Not on file  Social Connections: Not on file  Intimate Partner Violence: Not on file    ROS  ROS: Gen: no fever, chills  Skin: no rash, +chronic  itching ENT: no ear pain, ear drainage,  nasal congestion, rhinorrhea, sinus pressure, sore throat Eyes: no blurry vision, double vision Resp:smoker's cough-not worse.  No new sob CV: no CP,  occ palpitations, +intermitt LE edema,  GI: no heartburn, n/v/d/c, abd pain GU: no dysuria, urgency, frequency, hematuria MSK: chronic pain Neuro: no new dizziness, headache, weakness, Psych: HPI  Objective:   Today's Vitals: BP 140/80   Pulse 77   Temp 97.6 F (36.4 C) (Temporal)   Ht _0  (1.727 m)   Wt 223 lb 4 oz (101.3 kg)   SpO2 98% Comment: 3l o2  BMI 33.95 kg/m   Physical Exam  Gen: WDWN NAD owf.  On O2 HEENT: NCAT, conjunctiva not injected, sclera nonicteric.  +xanthaloma B medial upper lids NECK:  supple, no thyromegaly, no nodes, no carotid bruits CARDIAC: RRR, S1S2+, +2/6 murmur. DP 1+B LUNGS: CTAB. No wheezes but distant ABDOMEN:  BS+, soft, NTND, No HSM, no masses EXT:  tr edema MSK: no gross abnormalities.  NEURO: A&O x3.  CN II-XII intact.  PSYCH: normal mood. Good eye contact   Assessment & Plan:   Problem List Items Addressed This Visit       Cardiovascular and Mediastinum   Essential hypertension, benign - Primary   Relevant Medications   amLODipine (NORVASC) 5 MG tablet   atorvastatin (LIPITOR) 80 MG tablet   fenofibrate (TRICOR) 145 MG tablet   hydrochlorothiazide (HYDRODIURIL) 25 MG tablet   lisinopril (ZESTRIL) 5 MG tablet   Other Relevant Orders   Uric acid (Completed)   Magnesium (Completed)     Respiratory   Chronic hypoxemic respiratory failure ([name])     Endocrine   Type 2 diabetes mellitus with diabetic neuropathy ([name])   Relevant Medications   atorvastatin (LIPITOR) 80 MG tablet   lisinopril (ZESTRIL) 5 MG tablet   metFORMIN (GLUCOPHAGE) 1000 MG tablet   Other Relevant Orders   Hemoglobin A1c (Completed)   Lipid panel (Completed)   TSH (Completed)   VITAMIN D 25 Hydroxy (Vit-D Deficiency, Fractures) (Completed)   Microalbumin /  creatinine urine ratio (Completed)     Other   Chronic pain disorder   Relevant Medications   buPROPion ER (WELLBUTRIN SR) 100 MG 12 hr tablet   DULoxetine (CYMBALTA) 60 MG capsule   Depression   Relevant Medications   buPROPion ER (WELLBUTRIN SR) 100 MG 12 hr tablet   DULoxetine (CYMBALTA) 60 MG capsule   Hyperlipidemia   Relevant Medications   amLODipine (NORVASC) 5 MG tablet   atorvastatin (LIPITOR) 80 MG tablet   fenofibrate (TRICOR) 145 MG tablet   hydrochlorothiazide (HYDRODIURIL) 25 MG tablet   lisinopril (ZESTRIL) 5 MG tablet   Other Relevant Orders   Lipid panel (Completed)   Polycythemia vera ([place])   History of malignant melanoma   Other Visit Diagnoses     Need for shingles vaccine       Relevant Orders   Varicella-zoster vaccine IM (Shingrix) (Completed)   Vitamin D deficiency       Relevant Orders   VITAMIN D 25 Hydroxy (Vit-D Deficiency, Fractures) (Completed)   Screening for colon cancer       Relevant Orders   Ambulatory referral to Gastroenterology     Hypertension-chronic.  Fairly well controlled.  She is off of some of her medicines.  Will check CBC, CMP, uric acid.  Continue medications which will renewed: Amlodipine 5 mg, lisinopril 5 mg, hydrochlorothiazide 25 mg Type 2 diabetes with peripheral neuropathy/hypertension/hyperlipidemia-chronic.  Controlled.  Check lipids, CMP, A1c, urine microalbumin creatinine ratio.  Advised to get ophthalmology appointment scheduled.  Continue aspirin 81 mg gabapentin 100 to 300 mg 3 times daily as needed.  Metformin to 1000 mg twice daily.  Patient is on a statin and ACE inhibitor. 3.  Hyperlipidemia-chronic.  Questionable control.  Check lipid profile.  Continue fenofibrate 140 mg daily and atorvastatin 80 mg daily. 4.  Vitamin D deficiency-check vitamin D levels to determine appropriate supplementation 5.  Chronic pain-chronic.  Stable.  Managed by pain management.  Continue oxycodone.  Duloxetine. 6.   Depression/anxiety-chronic/stable on medication patient still has a great degree of agoraphobia.  Continue Cymbalta 120 mg/day, fish oil, Wellbutrin SR 100 mg twice daily 7.  Polycythemia vera/myeloproliferative syndrome-patient on Hydrea.  Managed by hematology. 8.  Hypoxia-probably COPD, also from lung resection for metastatic melanoma-chronic/stable.  Patient uses home O2.  Will review records.  Advised to stop smoking. 9.  History of metastatic melanoma to right lung-status post resections.  Primary never found.  Monitor Advised patient to follow-up in 3 months, however due to her agoraphobia, she would like to wait for 6 months.  Advised to monitor blood pressures etc. Will check labs.  Refill meds  Outpatient Encounter Medications as of 11/14/2021  Medication Sig   aspirin EC 81 MG tablet Take 1 tablet (81 mg total) by mouth daily.   Blood Glucose Monitoring Suppl (BLOOD GLUCOSE SYSTEM PAK) KIT Please dispense based on patient and insurance preference. Use as directed to monitor FSBS 2x daily. Dx: E11.9   fish oil-omega-3 fatty acids 1000 MG capsule Take 2 capsules (2 g total) by mouth 2 (two) times daily.   gabapentin (NEURONTIN) 100 MG capsule take 1-3 capsules BY MOUTH three times daily AS NEEDED FOR nerve pain   Glucose Blood (BLOOD GLUCOSE TEST STRIPS) STRP Please dispense based on patient and insurance preference. Use as directed to monitor FSBS 2x daily. Dx: E11.9   hydroxyurea (HYDREA) 500 MG capsule Take 3 capsules (1500 mg ) PO on Monday and Thursday and take 2 capsules (1000 mg) Tuesday Wednesday Friday Saturday Sunday. May take with food to minimize GI side effects.   Microlet Lancets MISC Please dispense based on patient and insurance preference. Use as directed to monitor FSBS 2x daily. Dx: E11.9   nystatin (MYCOSTATIN/NYSTOP) powder Apply 1 application topically 3 (three) times daily. PA Case: [id number] Approved 06/30/2019- 06/28/2020   oxyCODONE-acetaminophen (PERCOCET) 10-325 MG  tablet Take 1 tablet by mouth 4 (four) times daily as needed for severe pain.   senna (SENOKOT) 8.6 MG TABS tablet Take 2 tablets by mouth daily as needed for mild constipation.   [DISCONTINUED] amLODipine (NORVASC) 5 MG tablet TAKE 1 TABLET(5 MG) BY MOUTH DAILY   [DISCONTINUED] atorvastatin (LIPITOR) 80 MG tablet TAKE 1 TABLET BY MOUTH DAILY AT 6 PM   [DISCONTINUED] buPROPion ER (WELLBUTRIN SR) 100 MG 12 hr tablet TAKE 1 TABLET(100 MG) BY MOUTH TWICE DAILY   [DISCONTINUED] DULoxetine (CYMBALTA) 60 MG capsule TAKE TWO CAPSULES BY MOUTH EVERY MORNING   [DISCONTINUED] fenofibrate (TRICOR) 145 MG tablet TAKE ONE TABLET BY MOUTH EVERYDAY AT BEDTIME   [DISCONTINUED] hydrochlorothiazide (HYDRODIURIL) 25 MG tablet TAKE ONE TABLET BY MOUTH EVERY MORNING   [DISCONTINUED] lisinopril (ZESTRIL) 5 MG tablet TAKE 1 TABLET (5 MG) BY MOUTH AT BEDTIME   [DISCONTINUED] metFORMIN (GLUCOPHAGE) 1000 MG tablet Take 1 tablet (1,000 mg total) by mouth 2 (two) times daily with a meal.   [DISCONTINUED] nystatin cream (MYCOSTATIN) Apply 1 application topically 2 (two) times daily. To affected areas  amLODipine (NORVASC) 5 MG tablet TAKE 1 TABLET(5 MG) BY MOUTH DAILY   atorvastatin (LIPITOR) 80 MG tablet TAKE 1 TABLET BY MOUTH DAILY AT 6 PM   buPROPion ER (WELLBUTRIN SR) 100 MG 12 hr tablet TAKE 1 TABLET(100 MG) BY MOUTH TWICE DAILY   DULoxetine (CYMBALTA) 60 MG capsule TAKE TWO CAPSULES BY MOUTH EVERY MORNING   fenofibrate (TRICOR) 145 MG tablet TAKE ONE TABLET BY MOUTH EVERYDAY AT BEDTIME   hydrochlorothiazide (HYDRODIURIL) 25 MG tablet TAKE ONE TABLET BY MOUTH EVERY MORNING   lisinopril (ZESTRIL) 5 MG tablet TAKE 1 TABLET (5 MG) BY MOUTH AT BEDTIME   metFORMIN (GLUCOPHAGE) 1000 MG tablet Take 1 tablet (1,000 mg total) by mouth 2 (two) times daily with a meal.   nystatin cream (MYCOSTATIN) Apply 1 application. topically 2 (two) times daily. To affected areas   [DISCONTINUED] varenicline (CHANTIX) 1 MG tablet TAKE ONE  TABLET BY MOUTH twice daily   No facility-administered encounter medications on file as of 11/14/2021.    Follow-up: Return in about 6 months (around 05/17/2022) for dm htn.   Wellington Hampshire, MD

## 2021-11-14 NOTE — Patient Instructions (Signed)
Welcome to [facility]! It was a pleasure meeting you today.  As discussed, Please schedule a 6 month follow up visit today.  PLEASE NOTE:  If you had any LAB tests please let us know if you have not heard back within a few days. You may see your results on MyChart before we have a chance to review them but we will give you a call once they are reviewed by [place]. If we ordered any REFERRALS today, please let us know if you have not heard from their office within the next week.  Let us know through MyChart if you are needing REFILLS, or have your pharmacy send [place] the request. You can also use MyChart to communicate with me or any office staff.  Please try these tips to maintain a healthy lifestyle:  Eat most of your calories during the day when you are active. Eliminate processed foods including packaged sweets (pies, cakes, cookies), reduce intake of potatoes, white bread, white pasta, and white rice. Look for whole grain options, oat flour or almond flour.  Each meal should contain half fruits/vegetables, one quarter protein, and one quarter carbs (no bigger than a computer mouse).  Cut down on sweet beverages. This includes juice, soda, and sweet tea. Also watch fruit intake, though this is a healthier sweet option, it still contains natural sugar! Limit to 3 servings daily.  Drink at least 1 glass of water with each meal and aim for at least 8 glasses per day  Exercise at least 150 minutes every week.

## 2021-11-17 ENCOUNTER — Other Ambulatory Visit: Payer: Self-pay | Admitting: Hematology

## 2021-11-17 ENCOUNTER — Other Ambulatory Visit: Payer: Self-pay | Admitting: *Deleted

## 2021-11-17 DIAGNOSIS — D45 Polycythemia vera: Secondary | ICD-10-CM

## 2021-11-17 DIAGNOSIS — D471 Chronic myeloproliferative disease: Secondary | ICD-10-CM

## 2021-11-17 MED ORDER — VITAMIN D (ERGOCALCIFEROL) 1.25 MG (50000 UNIT) PO CAPS: 50000.0000 [IU] | ORAL_CAPSULE | ORAL | 0 refills | Status: DC

## 2021-11-17 NOTE — Addendum Note (Signed)
Addended by: Zacarias Pontes on: 11/17/2021 10:30 AM   Modules accepted: Orders

## 2021-12-17 ENCOUNTER — Telehealth: Payer: Self-pay | Admitting: Family Medicine

## 2021-12-17 NOTE — Telephone Encounter (Signed)
Copied from [place]. Topic: Medicare AWV >> Dec 17, 2021  9:34 AM Devoria Glassing wrote: Reason for CRM: Left message for patient to schedule Annual Wellness Visit.  Please schedule with Nurse Health Advisor Charlott Rakes, RN at [facility].  Please call [phone] ask for [facility]

## 2021-12-18 ENCOUNTER — Telehealth: Payer: Medicare HMO | Admitting: Physician Assistant

## 2021-12-18 ENCOUNTER — Encounter: Payer: Self-pay | Admitting: Physician Assistant

## 2021-12-18 DIAGNOSIS — R6889 Other general symptoms and signs: Secondary | ICD-10-CM

## 2021-12-18 NOTE — Patient Instructions (Signed)
Katelyn Cline, thank you for joining Leeanne Rio, PA-C for today's virtual visit.  While this provider is not your primary care provider (PCP), if your PCP is located in our provider database this encounter information will be shared with them immediately following your visit.  Consent: (Patient) Katelyn Cline provided verbal consent for this virtual visit at the beginning of the encounter.  Current Medications:  Current Outpatient Medications:    amLODipine (NORVASC) 5 MG tablet, TAKE 1 TABLET(5 MG) BY MOUTH DAILY, Disp: 90 tablet, Rfl: 1   aspirin EC 81 MG tablet, Take 1 tablet (81 mg total) by mouth daily., Disp: 90 tablet, Rfl: 1   atorvastatin (LIPITOR) 80 MG tablet, TAKE 1 TABLET BY MOUTH DAILY AT 6 PM, Disp: 90 tablet, Rfl: 1   Blood Glucose Monitoring Suppl (BLOOD GLUCOSE SYSTEM PAK) KIT, Please dispense based on patient and insurance preference. Use as directed to monitor FSBS 2x daily. Dx: E11.9, Disp: 1 kit, Rfl: 1   buPROPion ER (WELLBUTRIN SR) 100 MG 12 hr tablet, TAKE 1 TABLET(100 MG) BY MOUTH TWICE DAILY, Disp: 180 tablet, Rfl: 1   DULoxetine (CYMBALTA) 60 MG capsule, TAKE TWO CAPSULES BY MOUTH EVERY MORNING, Disp: 180 capsule, Rfl: 1   fenofibrate (TRICOR) 145 MG tablet, TAKE ONE TABLET BY MOUTH EVERYDAY AT BEDTIME, Disp: 90 tablet, Rfl: 1   fish oil-omega-3 fatty acids 1000 MG capsule, Take 2 capsules (2 g total) by mouth 2 (two) times daily., Disp: 180 capsule, Rfl: 1   gabapentin (NEURONTIN) 100 MG capsule, take 1-3 capsules BY MOUTH three times daily AS NEEDED FOR nerve pain, Disp: 270 capsule, Rfl: 1   Glucose Blood (BLOOD GLUCOSE TEST STRIPS) STRP, Please dispense based on patient and insurance preference. Use as directed to monitor FSBS 2x daily. Dx: E11.9, Disp: 100 strip, Rfl: 1   hydrochlorothiazide (HYDRODIURIL) 25 MG tablet, TAKE ONE TABLET BY MOUTH EVERY MORNING, Disp: 90 tablet, Rfl: 1   hydroxyurea (HYDREA) 500 MG capsule, TAKE TWO CAPSULES BY MOUTH  EVERYDAY AT BEDTIME, Disp: 90 capsule, Rfl: 3   lisinopril (ZESTRIL) 5 MG tablet, TAKE 1 TABLET (5 MG) BY MOUTH AT BEDTIME, Disp: 90 tablet, Rfl: 1   metFORMIN (GLUCOPHAGE) 1000 MG tablet, Take 1 tablet (1,000 mg total) by mouth 2 (two) times daily with a meal., Disp: 180 tablet, Rfl: 1   Microlet Lancets MISC, Please dispense based on patient and insurance preference. Use as directed to monitor FSBS 2x daily. Dx: E11.9, Disp: 100 each, Rfl: 11   nystatin (MYCOSTATIN/NYSTOP) powder, Apply 1 application topically 3 (three) times daily. PA Case: [id number] Approved 06/30/2019- 06/28/2020, Disp: 60 g, Rfl: 2   nystatin cream (MYCOSTATIN), Apply 1 application. topically 2 (two) times daily. To affected areas, Disp: 60 g, Rfl: 2   oxyCODONE-acetaminophen (PERCOCET) 10-325 MG tablet, Take 1 tablet by mouth 4 (four) times daily as needed for severe pain., Disp: , Rfl:    senna (SENOKOT) 8.6 MG TABS tablet, Take 2 tablets by mouth daily as needed for mild constipation., Disp: , Rfl:    Vitamin D, Ergocalciferol, (DRISDOL) 1.25 MG (50000 UNIT) CAPS capsule, Take 1 capsule (50,000 Units total) by mouth every 7 (seven) days., Disp: 12 capsule, Rfl: 0   Medications ordered in this encounter:  No orders of the defined types were placed in this encounter.    *If you need refills on other medications prior to your next appointment, please contact your pharmacy*  Follow-Up: Call back or seek an in-person evaluation  if the symptoms worsen or if the condition fails to improve as anticipated.  Other Instructions Please take the COVID test ASAP so we can determine next steps. Keep hydrated and rest. Ok to continue Dayquil and Nyquil.  Start OTC Vitamin C 1000 mg daily in addition to your Vitamin D.   Message me as soon as you have results so we can start proper antiviral treatment.  If anything worsens, especially before you are able to get and take the COVID test, you need in-person evaluation at nearest ER.     If you have been instructed to have an in-person evaluation today at a local Urgent Care facility, please use the link below. It will take you to a list of all of our available [facility], including address, phone number and hours of operation. Please do not delay care.  [facility]  If you or a family member do not have a primary care provider, use the link below to schedule a visit and establish care. When you choose a [facility] primary care physician or advanced practice provider, you gain a long-term partner in health. Find a Primary Care Provider  Learn more about [facility] in-office and virtual care options: [facility]

## 2021-12-19 MED ORDER — OSELTAMIVIR PHOSPHATE 75 MG PO CAPS: 75.0000 mg | ORAL_CAPSULE | Freq: Two times a day (BID) | ORAL | 0 refills | Status: DC

## 2021-12-19 MED ORDER — BENZONATATE 100 MG PO CAPS: 100.0000 mg | ORAL_CAPSULE | Freq: Three times a day (TID) | ORAL | 0 refills | Status: DC | PRN

## 2021-12-25 NOTE — Progress Notes (Signed)
This encounter was created in error - please disregard.left voice message pt needs to be rescheduled

## 2021-12-26 ENCOUNTER — Ambulatory Visit (INDEPENDENT_AMBULATORY_CARE_PROVIDER_SITE_OTHER): Payer: Medicare HMO

## 2021-12-26 DIAGNOSIS — Z0000 Encounter for general adult medical examination without abnormal findings: Secondary | ICD-10-CM | POA: Diagnosis not present

## 2021-12-26 NOTE — Patient Instructions (Signed)
Katelyn Cline , Thank you for taking time to come for your Medicare Wellness Visit. I appreciate your ongoing commitment to your health goals. Please review the following plan we discussed and let me know if I can assist you in the future.   Screening recommendations/referrals: Colonoscopy: order placed 12/26/21 Mammogram: pt will schedule at later date  Recommended yearly ophthalmology/optometry visit for glaucoma screening and checkup Recommended yearly dental visit for hygiene and checkup  Vaccinations: Influenza vaccine: due  Pneumococcal vaccine: completed 10/25/13 Tdap vaccine: due  Shingles vaccine: 1st dose 11/14/21  Covid-19: Completed 5/24, 12/19/19  Advanced directives: Advance directive discussed with you today. Even though you declined this today please call our office should you change your mind and we can give you the proper paperwork for you to fill out.  Conditions/risks identified: none at this time   Next appointment: Follow up in one year for your annual wellness visit.   Preventive Care 40-64 Years, Female Preventive care refers to lifestyle choices and visits with your health care provider that can promote health and wellness. What does preventive care include? A yearly physical exam. This is also called an annual well check. Dental exams once or twice a year. Routine eye exams. Ask your health care provider how often you should have your eyes checked. Personal lifestyle choices, including: Daily care of your teeth and gums. Regular physical activity. Eating a healthy diet. Avoiding tobacco and drug use. Limiting alcohol use. Practicing safe sex. Taking low-dose aspirin daily starting at age 92. Taking vitamin and mineral supplements as recommended by your health care provider. What happens during an annual well check? The services and screenings done by your health care provider during your annual well check will depend on your age, overall health, lifestyle  risk factors, and family history of disease. Counseling  Your health care provider may ask you questions about your: Alcohol use. Tobacco use. Drug use. Emotional well-being. Home and relationship well-being. Sexual activity. Eating habits. Work and work Statistician. Method of birth control. Menstrual cycle. Pregnancy history. Screening  You may have the following tests or measurements: Height, weight, and BMI. Blood pressure. Lipid and cholesterol levels. These may be checked every 5 years, or more frequently if you are over 35 years old. Skin check. Lung cancer screening. You may have this screening every year starting at age 101 if you have a 30-pack-year history of smoking and currently smoke or have quit within the past 15 years. Fecal occult blood test (FOBT) of the stool. You may have this test every year starting at age 19. Flexible sigmoidoscopy or colonoscopy. You may have a sigmoidoscopy every 5 years or a colonoscopy every 10 years starting at age 14. Hepatitis C blood test. Hepatitis B blood test. Sexually transmitted disease (STD) testing. Diabetes screening. This is done by checking your blood sugar (glucose) after you have not eaten for a while (fasting). You may have this done every 1-3 years. Mammogram. This may be done every 1-2 years. Talk to your health care provider about when you should start having regular mammograms. This may depend on whether you have a family history of breast cancer. BRCA-related cancer screening. This may be done if you have a family history of breast, ovarian, tubal, or peritoneal cancers. Pelvic exam and Pap test. This may be done every 3 years starting at age 73. Starting at age 54, this may be done every 5 years if you have a Pap test in combination with an HPV test. Bone  density scan. This is done to screen for osteoporosis. You may have this scan if you are at high risk for osteoporosis. Discuss your test results, treatment options,  and if necessary, the need for more tests with your health care provider. Vaccines  Your health care provider may recommend certain vaccines, such as: Influenza vaccine. This is recommended every year. Tetanus, diphtheria, and acellular pertussis (Tdap, Td) vaccine. You may need a Td booster every 10 years. Zoster vaccine. You may need this after age 58. Pneumococcal 13-valent conjugate (PCV13) vaccine. You may need this if you have certain conditions and were not previously vaccinated. Pneumococcal polysaccharide (PPSV23) vaccine. You may need one or two doses if you smoke cigarettes or if you have certain conditions. Talk to your health care provider about which screenings and vaccines you need and how often you need them. This information is not intended to replace advice given to you by your health care provider. Make sure you discuss any questions you have with your health care provider. Document Released: 07/12/2015 Document Revised: 03/04/2016 Document Reviewed: 04/16/2015 Elsevier Interactive Patient Education  2017 [place] Prevention in the Home Falls can cause injuries. They can happen to people of all ages. There are many things you can do to make your home safe and to help prevent falls. What can I do on the outside of my home? Regularly fix the edges of walkways and driveways and fix any cracks. Remove anything that might make you trip as you walk through a door, such as a raised step or threshold. Trim any bushes or trees on the path to your home. Use bright outdoor lighting. Clear any walking paths of anything that might make someone trip, such as rocks or tools. Regularly check to see if handrails are loose or broken. Make sure that both sides of any steps have handrails. Any raised decks and porches should have guardrails on the edges. Have any leaves, snow, or ice cleared regularly. Use sand or salt on walking paths during winter. Clean up any spills in  your garage right away. This includes oil or grease spills. What can I do in the bathroom? Use night lights. Install grab bars by the toilet and in the tub and shower. Do not use towel bars as grab bars. Use non-skid mats or decals in the tub or shower. If you need to sit down in the shower, use a plastic, non-slip stool. Keep the floor dry. Clean up any water that spills on the floor as soon as it happens. Remove soap buildup in the tub or shower regularly. Attach bath mats securely with double-sided non-slip rug tape. Do not have throw rugs and other things on the floor that can make you trip. What can I do in the bedroom? Use night lights. Make sure that you have a light by your bed that is easy to reach. Do not use any sheets or blankets that are too big for your bed. They should not hang down onto the floor. Have a firm chair that has side arms. You can use this for support while you get dressed. Do not have throw rugs and other things on the floor that can make you trip. What can I do in the kitchen? Clean up any spills right away. Avoid walking on wet floors. Keep items that you use a lot in easy-to-reach places. If you need to reach something above you, use a strong step stool that has a grab bar. Keep  electrical cords out of the way. Do not use floor polish or wax that makes floors slippery. If you must use wax, use non-skid floor wax. Do not have throw rugs and other things on the floor that can make you trip. What can I do with my stairs? Do not leave any items on the stairs. Make sure that there are handrails on both sides of the stairs and use them. Fix handrails that are broken or loose. Make sure that handrails are as long as the stairways. Check any carpeting to make sure that it is firmly attached to the stairs. Fix any carpet that is loose or worn. Avoid having throw rugs at the top or bottom of the stairs. If you do have throw rugs, attach them to the floor with carpet  tape. Make sure that you have a light switch at the top of the stairs and the bottom of the stairs. If you do not have them, ask someone to add them for you. What else can I do to help prevent falls? Wear shoes that: Do not have high heels. Have rubber bottoms. Are comfortable and fit you well. Are closed at the toe. Do not wear sandals. If you use a stepladder: Make sure that it is fully opened. Do not climb a closed stepladder. Make sure that both sides of the stepladder are locked into place. Ask someone to hold it for you, if possible. Clearly mark and make sure that you can see: Any grab bars or handrails. First and last steps. Where the edge of each step is. Use tools that help you move around (mobility aids) if they are needed. These include: Canes. Walkers. Scooters. Crutches. Turn on the lights when you go into a dark area. Replace any light bulbs as soon as they burn out. Set up your furniture so you have a clear path. Avoid moving your furniture around. If any of your floors are uneven, fix them. If there are any pets around you, be aware of where they are. Review your medicines with your doctor. Some medicines can make you feel dizzy. This can increase your chance of falling. Ask your doctor what other things that you can do to help prevent falls. This information is not intended to replace advice given to you by your health care provider. Make sure you discuss any questions you have with your health care provider. Document Released: 04/11/2009 Document Revised: 11/21/2015 Document Reviewed: 07/20/2014 Elsevier Interactive Patient Education  2017 [employer].

## 2021-12-26 NOTE — Progress Notes (Signed)
Virtual Visit via Telephone Note  I connected with  Katelyn Cline on 12/26/21 at  3:15 PM EDT by telephone and verified that I am speaking with the correct person using two identifiers.  Medicare Annual Wellness visit completed telephonically due to Covid-19 pandemic.   Persons participating in this call: This Health Coach and this patient.   Location: Patient: Home Provider: Office   I discussed the limitations, risks, security and privacy concerns of performing an evaluation and management service by telephone and the availability of in person appointments. The patient expressed understanding and agreed to proceed.  Unable to perform video visit due to video visit attempted and failed and/or patient does not have video capability.   Some vital signs may be absent or patient reported.   Willette Brace, LPN   Subjective:   Katelyn Cline is a 61 y.o. female who presents for Medicare Annual (Subsequent) preventive examination.  Review of Systems     Cardiac Risk Factors include: advanced age (>6men, >59 women);obesity (BMI >30kg/m2);sedentary lifestyle;dyslipidemia;hypertension;diabetes mellitus;smoking/ tobacco exposure     Objective:    There were no vitals filed for this visit. There is no height or weight on file to calculate BMI.     12/26/2021    3:09 PM 02/18/2021   11:16 AM 06/18/2020   10:45 AM 05/24/2020    3:09 PM 01/12/2020   10:26 AM 11/17/2019   10:28 AM 09/19/2018    5:39 AM  Advanced Directives  Does Patient Have a Medical Advance Directive? No No No No No No No  Would patient like information on creating a medical advance directive? No - Patient declined No - Patient declined Yes (MAU/Ambulatory/Procedural Areas - Information given) No - Patient declined No - Patient declined No - Patient declined No - Patient declined    Current Medications (verified) Outpatient Encounter Medications as of 12/26/2021  Medication Sig   amLODipine (NORVASC) 5 MG  tablet TAKE 1 TABLET(5 MG) BY MOUTH DAILY   aspirin EC 81 MG tablet Take 1 tablet (81 mg total) by mouth daily.   atorvastatin (LIPITOR) 80 MG tablet TAKE 1 TABLET BY MOUTH DAILY AT 6 PM   Blood Glucose Monitoring Suppl (BLOOD GLUCOSE SYSTEM PAK) KIT Please dispense based on patient and insurance preference. Use as directed to monitor FSBS 2x daily. Dx: E11.9   buPROPion ER (WELLBUTRIN SR) 100 MG 12 hr tablet TAKE 1 TABLET(100 MG) BY MOUTH TWICE DAILY   DULoxetine (CYMBALTA) 60 MG capsule TAKE TWO CAPSULES BY MOUTH EVERY MORNING   fenofibrate (TRICOR) 145 MG tablet TAKE ONE TABLET BY MOUTH EVERYDAY AT BEDTIME   fish oil-omega-3 fatty acids 1000 MG capsule Take 2 capsules (2 g total) by mouth 2 (two) times daily.   gabapentin (NEURONTIN) 100 MG capsule take 1-3 capsules BY MOUTH three times daily AS NEEDED FOR nerve pain   Glucose Blood (BLOOD GLUCOSE TEST STRIPS) STRP Please dispense based on patient and insurance preference. Use as directed to monitor FSBS 2x daily. Dx: E11.9   hydrochlorothiazide (HYDRODIURIL) 25 MG tablet TAKE ONE TABLET BY MOUTH EVERY MORNING   hydroxyurea (HYDREA) 500 MG capsule TAKE TWO CAPSULES BY MOUTH EVERYDAY AT BEDTIME   lisinopril (ZESTRIL) 5 MG tablet TAKE 1 TABLET (5 MG) BY MOUTH AT BEDTIME   metFORMIN (GLUCOPHAGE) 1000 MG tablet Take 1 tablet (1,000 mg total) by mouth 2 (two) times daily with a meal.   Microlet Lancets MISC Please dispense based on patient and insurance preference. Use as directed  to monitor FSBS 2x daily. Dx: E11.9   nystatin (MYCOSTATIN/NYSTOP) powder Apply 1 application topically 3 (three) times daily. PA Case: [id number] Approved 06/30/2019- 06/28/2020   nystatin cream (MYCOSTATIN) Apply 1 application. topically 2 (two) times daily. To affected areas   oxyCODONE-acetaminophen (PERCOCET) 10-325 MG tablet Take 1 tablet by mouth 4 (four) times daily as needed for severe pain.   senna (SENOKOT) 8.6 MG TABS tablet Take 2 tablets by mouth daily as needed  for mild constipation.   Vitamin D, Ergocalciferol, (DRISDOL) 1.25 MG (50000 UNIT) CAPS capsule Take 1 capsule (50,000 Units total) by mouth every 7 (seven) days.   benzonatate (TESSALON) 100 MG capsule Take 1 capsule (100 mg total) by mouth 3 (three) times daily as needed for cough. (Patient not taking: Reported on 12/26/2021)   [DISCONTINUED] oseltamivir (TAMIFLU) 75 MG capsule Take 1 capsule (75 mg total) by mouth 2 (two) times daily.   No facility-administered encounter medications on file as of 12/26/2021.    Allergies (verified) Contrast media [iodinated contrast media], Erythromycin, Flagyl [metronidazole], Latex, Other, Penicillins, and Tetracyclines & related   History: Past Medical History:  Diagnosis Date   Agoraphobia    Arthritis    "all joints"   Chronic low back pain    Chronic respiratory failure with hypoxia ([name])    4L  via [place],  followed by pcp,   (09-16-2018  per pt only uses while at home and at night ,  portable oxygen is not working)   Clotting disorder ([place]) 2013   Polycythemia vera   COPD (chronic obstructive pulmonary disease) ([place])    DDD (degenerative disc disease), lumbosacral    Dental caries    DM type 2 (diabetes mellitus, type 2) ([place])    followed by pcp   Fibromyalgia    GERD (gastroesophageal reflux disease)    occasional , no meds   Heart murmur 2020   History of basal cell carcinoma (BCC) excision    s/p  Moh's of face/ nose 12/ 2013   History of palpitations    per pt found to have occaional PVCs   History of UTI    HTN (hypertension)    Hyperlipidemia    Loose, teeth    Major depression    Metastatic melanoma ([place]) 10/18/2012   12 mm posterior right upper lobe pulmonary nodule, max SUV 3.0  s/p  right wedge resection 10-21-2012,  followed by oncologist-- dr Irene Limbo,  no recurrence   Mixed stress and urge urinary incontinence    Myeloproliferative neoplasm ([place])    Narcolepsy    OSA (obstructive sleep apnea)    Oxygen deficiency    Oxygen  dependent    4 L via [place],  per pt portable oxygen not working but does use while at home and at night   Panic attacks    Periodontitis    Peripheral neuropathy    feet   Polycythemia vera(238.4) hematology/ oncology-- dr Irene Limbo ([facility])   first dx 02014 due to smoker/copd--  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)  hx phlebotomies   Pulmonary nodules    bilateral small stable per CT 03-11-2018   Scoliosis    Sleep apnea    Smokers' cough ([place])    per pt productive a little in the morning's   Symptoms of upper respiratory infection (URI) 03/12/2018   Wears glasses    Past Surgical History:  Procedure Laterality Date   ABDOMINAL HYSTERECTOMY  1998   partial   BREAST LUMPECTOMY Bilateral  1995   benign per pt   COLONOSCOPY W/ BIOPSIES AND POLYPECTOMY     Hx: of   CYSTECTOMY  2000   abdominal wall    DILATION AND CURETTAGE OF UTERUS  yrs ago   MOHS SURGERY  2013   nose/face   MULTIPLE EXTRACTIONS WITH ALVEOLOPLASTY N/A 09/19/2018   Procedure: Extraction of tooth #'s 2, 3, 6-14, 18, and 20-30 with alveoloplasty;  Surgeon: [name], DDS;  Location: [facility];  Service: Oral Surgery;  Laterality: N/A;  GENERAL WITH NASAL TUBE   VIDEO ASSISTED THORACOSCOPY (VATS)/WEDGE RESECTION Right 10/21/2012   Procedure: VIDEO ASSISTED THORACOSCOPY (VATS)/WEDGE RESECTION;  Surgeon: [name], MD;  Location: [facility];  Service: Thoracic;  Laterality: Right;   VIDEO BRONCHOSCOPY N/A 10/21/2012   Procedure: VIDEO BRONCHOSCOPY;  Surgeon: [name], MD;  Location: [facility];  Service: Thoracic;  Laterality: N/A;   Family History  Problem Relation Age of Onset   Arthritis Mother    Hyperlipidemia Mother    Depression Mother    Anxiety disorder Mother    Dementia Mother    Hypertension Father    Hyperlipidemia Father    Heart disease Father    Stroke Father    Dementia Father    Heart disease Brother    ADD / ADHD Son    Alcohol abuse Maternal  Grandfather    Bipolar disorder Neg Hx    Drug abuse Neg Hx    OCD Neg Hx    Paranoid behavior Neg Hx    Schizophrenia Neg Hx    Seizures Neg Hx    Sexual abuse Neg Hx    Physical abuse Neg Hx    Social History   Socioeconomic History   Marital status: Divorced    Spouse name: Not on file   Number of children: 1   Years of education: 12+   Highest education level: Not on file  Occupational History   Not on file  Tobacco Use   Smoking status: Every Day    Packs/day: 0.50    Years: 50.00    Total pack years: 25.00    Types: Cigarettes   Smokeless tobacco: Never   Tobacco comments:    Quit a few times  Vaping Use   Vaping Use: Former   Quit date: 09/15/2016  Substance and Sexual Activity   Alcohol use: Yes    Alcohol/week: 1.0 standard drink of alcohol    Types: 1 Standard drinks or equivalent per week    Comment: Not weekly   Drug use: Never   Sexual activity: Not Currently    Birth control/protection: Surgical  Other Topics Concern   Not on file  Social History Narrative   Son-lives w/son.   disabled   Social Determinants of Health   Financial Resource Strain: Low Risk  (12/26/2021)   Overall Financial Resource Strain (CARDIA)    Difficulty of Paying Living Expenses: Not hard at all  Food Insecurity: No Food Insecurity (12/26/2021)   Hunger Vital Sign    Worried About Running Out of Food in the Last Year: Never true    Ran Out of Food in the Last Year: Never true  Transportation Needs: No Transportation Needs (12/26/2021)   PRAPARE - Hydrologist (Medical): No    Lack of Transportation (Non-Medical): No  Physical Activity: Inactive (12/26/2021)   Exercise Vital Sign    Days of Exercise per Week: 0 days    Minutes of Exercise per  Session: 0 min  Stress: Stress Concern Present (12/26/2021)   [place]    Feeling of Stress : Rather much  Social Connections: Socially  Isolated (12/26/2021)   Social Connection and Isolation Panel [NHANES]    Frequency of Communication with Friends and Family: Once a week    Frequency of Social Gatherings with Friends and Family: Once a week    Attends Religious Services: Never    Marine scientist or Organizations: No    Attends Music therapist: Never    Marital Status: Divorced    Tobacco Counseling Ready to quit: Not Answered Counseling given: Not Answered Tobacco comments: Quit a few times   Clinical Intake:  Pre-visit preparation completed: Yes  Pain : No/denies pain     BMI - recorded: 33.95 Nutritional Status: BMI > 30  Obese Nutritional Risks: None Diabetes: Yes CBG done?: No Did pt. bring in CBG monitor from home?: No  How often do you need to have someone help you when you read instructions, pamphlets, or other written materials from your doctor or pharmacy?: 1 - Never  Diabetic?Nutrition Risk Assessment:  Has the patient had any N/V/D within the last 2 months?  Yes nausea at times  Does the patient have any non-healing wounds?  No  Has the patient had any unintentional weight loss or weight gain?  No   Diabetes:  Is the patient diabetic?  Yes  If diabetic, was a CBG obtained today?  No  Did the patient bring in their glucometer from home?  No  How often do you monitor your CBG's? N/A.   Financial Strains and Diabetes Management:  Are you having any financial strains with the device, your supplies or your medication? No .  Does the patient want to be seen by Chronic Care Management for management of their diabetes?  No  Would the patient like to be referred to a Nutritionist or for Diabetic Management?  No   Diabetic Exams:  Diabetic Eye Exam: Overdue for diabetic eye exam. Pt has been advised about the importance in completing this exam. Patient advised to call and schedule an eye exam. Diabetic Foot Exam: Completed 02/19/21   Interpreter Needed?:  No  Information entered by :: Charlott Rakes, LPN   Activities of Daily Living    12/26/2021    3:12 PM  In your present state of health, do you have any difficulty performing the following activities:  Hearing? 1  Comment HOH  Vision? 0  Difficulty concentrating or making decisions? 0  Walking or climbing stairs? 1  Comment difficult whe have to climb  Dressing or bathing? 0  Comment slowly  Doing errands, shopping? 0  Preparing Food and eating ? N  Comment slowly  Using the Toilet? N  In the past six months, have you accidently leaked urine? Y  Comment at times  Do you have problems with loss of bowel control? Y  Comment at times  Managing your Medications? N  Managing your Finances? N  Housekeeping or managing your Housekeeping? N    Patient Care Team: [name], MD as PCP - General (Family Medicine) [name], MD as Consulting Physician (Hematology) [name], [facility] as Pharmacist (Pharmacist)  Indicate any recent Medical Services you may have received from other than [facility] providers in the past year (date may be approximate).     Assessment:   This is a routine wellness examination for Katelyn Cline.  Hearing/Vision screen Hearing Screening - Comments:: Pt HOH  Vision Screening - Comments:: Pt encouraged to follow up with new provider   Dietary issues and exercise activities discussed: Current Exercise Habits: The patient does not participate in regular exercise at present   Goals Addressed             This Visit's Progress    Patient Stated       None at this time        Depression Screen    12/26/2021    3:07 PM 02/19/2021   12:01 PM 06/18/2020   10:46 AM 10/16/2019   11:36 AM 07/18/2018    8:21 AM 03/23/2018    8:34 AM 03/10/2018   10:59 AM  PHQ 2/9 Scores  PHQ - 2 Score $Remov'3 4 4 4 4 4 4  'DNSZvm$ PHQ- 9 Score $Remov'15 20 8 11 11 13 16    'FBeLgO$ Fall Risk    12/26/2021    3:11 PM 11/14/2021    9:23 AM 06/18/2020   10:46 AM 10/16/2019   11:36 AM  07/18/2018    8:21 AM  Fall Risk   Falls in the past year? 1 0 0 1 0  Number falls in past yr: 1 0  0   Injury with Fall? 0 0  0   Risk for fall due to : Impaired vision;Impaired mobility;Impaired balance/gait Impaired balance/gait;Other (Comment) Impaired mobility;Impaired balance/gait History of fall(s);Impaired balance/gait Impaired balance/gait;Impaired mobility  Risk for fall due to: Comment  USES A CANE     Follow up Falls prevention discussed  Falls evaluation completed Falls evaluation completed Falls evaluation completed    [place]:  Any stairs in or around the home? Yes  If so, are there any without handrails? No  Home free of loose throw rugs in walkways, pet beds, electrical cords, etc? Yes  Adequate lighting in your home to reduce risk of falls? Yes   ASSISTIVE DEVICES UTILIZED TO PREVENT FALLS:  Life alert? No  Use of a cane, walker or w/c? Yes  Grab bars in the bathroom? Yes  Shower chair or bench in shower? Yes  Elevated toilet seat or a handicapped toilet? No   TIMED UP AND GO:  Was the test performed? No .   Cognitive Function:        12/26/2021    3:15 PM  6CIT Screen  What Year? 0 points  What month? 0 points  What time? 0 points  Count back from 20 0 points  Months in reverse 0 points  Repeat phrase 0 points  Total Score 0 points    Immunizations Immunization History  Administered Date(s) Administered   Hepatitis B, adult 06/29/2001   Hepatitis B, ped/adol 06/29/2001   Influenza,inj,Quad PF,6+ Mos 04/07/2013, 05/18/2014, 05/28/2015, 05/13/2016, 06/11/2017, 07/18/2018, 06/16/2019, 06/18/2020   Moderna Sars-Covid-2 Vaccination 11/20/2019, 12/19/2019   Pneumococcal Polysaccharide-23 12/10/2011, 10/25/2013   Td 06/29/2001   Tdap 12/10/2011   Zoster Recombinat (Shingrix) 11/14/2021    TDAP status: Due, Education has been provided regarding the importance of this vaccine. Advised may receive this vaccine at  local pharmacy or Health Dept. Aware to provide a copy of the vaccination record if obtained from local pharmacy or Health Dept. Verbalized acceptance and understanding.  Flu Vaccine status: Due, Education has been provided regarding the importance of this vaccine. Advised may receive this vaccine at local pharmacy or Health Dept. Aware to provide a copy of the vaccination record if obtained from local  pharmacy or Health Dept. Verbalized acceptance and understanding.  Pneumococcal vaccine status: Due, Education has been provided regarding the importance of this vaccine. Advised may receive this vaccine at local pharmacy or Health Dept. Aware to provide a copy of the vaccination record if obtained from local pharmacy or Health Dept. Verbalized acceptance and understanding.  Covid-19 vaccine status: Completed vaccines  Qualifies for Shingles Vaccine? Yes   Zostavax completed Yes   Shingrix Completed?: Yes  Screening Tests Health Maintenance  Topic Date Due   COLONOSCOPY (Pts 45-31yrs Insurance coverage will need to be confirmed)  Never done   OPHTHALMOLOGY EXAM  09/07/2018   MAMMOGRAM  04/21/2019   TETANUS/TDAP  12/09/2021   COVID-19 Vaccine (3 - Moderna risk series) 02/17/2022 (Originally 01/16/2020)   Zoster Vaccines- Shingrix (2 of 2) 01/09/2022   INFLUENZA VACCINE  01/27/2022   FOOT EXAM  02/19/2022   HEMOGLOBIN A1C  05/17/2022   Hepatitis C Screening  Completed   HIV Screening  Completed   HPV VACCINES  Aged Out    Health Maintenance  Health Maintenance Due  Topic Date Due   COLONOSCOPY (Pts 45-77yrs Insurance coverage will need to be confirmed)  Never done   OPHTHALMOLOGY EXAM  09/07/2018   MAMMOGRAM  04/21/2019   TETANUS/TDAP  12/09/2021    Colorectal cancer screening: Referral to GI placed 12/26/21. Pt aware the office will call re: appt.  Mammogram status: Completed 04/20/17. Repeat every year pt stated schedule at another time    Additional Screening:  Hepatitis C  Screening: Completed 10/11/17  Vision Screening: Recommended annual ophthalmology exams for early detection of glaucoma and other disorders of the eye. Is the patient up to date with their annual eye exam?  No  Who is the provider or what is the name of the office in which the patient attends annual eye exams? Encouraged to find new provider  If pt is not established with a provider, would they like to be referred to a provider to establish care? No .   Dental Screening: Recommended annual dental exams for proper oral hygiene  Community Resource Referral / Chronic Care Management: CRR required this visit?  No   CCM required this visit?  No      Plan:     I have personally reviewed and noted the following in the patient's chart:   Medical and social history Use of alcohol, tobacco or illicit drugs  Current medications and supplements including opioid prescriptions.  Functional ability and status Nutritional status Physical activity Advanced directives List of other physicians Hospitalizations, surgeries, and ER visits in previous 12 months Vitals Screenings to include cognitive, depression, and falls Referrals and appointments  In addition, I have reviewed and discussed with patient certain preventive protocols, quality metrics, and best practice recommendations. A written personalized care plan for preventive services as well as general preventive health recommendations were provided to patient.     Willette Brace, LPN   [id number]   Nurse Notes: None

## 2022-01-02 ENCOUNTER — Other Ambulatory Visit: Payer: Medicare HMO

## 2022-01-02 ENCOUNTER — Ambulatory Visit: Payer: Medicare HMO | Admitting: Hematology

## 2022-01-29 ENCOUNTER — Other Ambulatory Visit: Payer: Self-pay

## 2022-01-29 DIAGNOSIS — D45 Polycythemia vera: Secondary | ICD-10-CM

## 2022-01-30 ENCOUNTER — Telehealth: Payer: Self-pay | Admitting: Hematology

## 2022-01-30 ENCOUNTER — Inpatient Hospital Stay: Payer: Medicare HMO

## 2022-01-30 ENCOUNTER — Inpatient Hospital Stay: Payer: Medicare HMO | Attending: Hematology | Admitting: Hematology

## 2022-01-30 NOTE — Telephone Encounter (Signed)
Called pt to r/s per 8/4 in basket, pt said they will call back on Monday to r/s

## 2022-02-09 ENCOUNTER — Other Ambulatory Visit: Payer: Self-pay | Admitting: Family Medicine

## 2022-02-11 ENCOUNTER — Other Ambulatory Visit: Payer: Self-pay | Admitting: Family Medicine

## 2022-03-16 ENCOUNTER — Encounter: Payer: Self-pay | Admitting: Family Medicine

## 2022-03-23 ENCOUNTER — Encounter: Payer: Self-pay | Admitting: *Deleted

## 2022-04-02 ENCOUNTER — Encounter: Payer: Self-pay | Admitting: Physician Assistant

## 2022-04-22 ENCOUNTER — Telehealth: Payer: Self-pay | Admitting: Hematology

## 2022-04-22 NOTE — Telephone Encounter (Signed)
Scheduled appointment per 10/24 scheduling message. Patient is aware.

## 2022-04-23 ENCOUNTER — Other Ambulatory Visit: Payer: Self-pay | Admitting: Family Medicine

## 2022-04-23 DIAGNOSIS — J9611 Chronic respiratory failure with hypoxia: Secondary | ICD-10-CM

## 2022-04-27 ENCOUNTER — Telehealth: Payer: Self-pay | Admitting: Family Medicine

## 2022-04-27 ENCOUNTER — Other Ambulatory Visit: Payer: Self-pay | Admitting: *Deleted

## 2022-04-27 MED ORDER — ACCU-CHEK GUIDE VI STRP: ORAL_STRIP | 12 refills | Status: DC

## 2022-04-27 NOTE — Telephone Encounter (Signed)
Rx sent to the pharmacy.

## 2022-04-27 NOTE — Telephone Encounter (Signed)
  LAST APPOINTMENT DATE:  Please schedule appointment if longer than 1 year  11/14/21  NEXT APPOINTMENT DATE: 05/18/22  MEDICATION: Accuchek Guide Test Strips  Is the patient out of medication? No-approx. 20 left  PHARMACY:  [facility] - [place], [place] - [address] Phone: [phone]  Fax: [phone]     Let patient know to contact pharmacy at the end of the day to make sure medication is ready.  Please notify patient to allow 48-72 hours to process

## 2022-04-30 ENCOUNTER — Other Ambulatory Visit: Payer: Self-pay | Admitting: Family Medicine

## 2022-05-06 ENCOUNTER — Encounter: Payer: Self-pay | Admitting: Physician Assistant

## 2022-05-06 ENCOUNTER — Ambulatory Visit: Payer: Medicare HMO | Admitting: Physician Assistant

## 2022-05-06 VITALS — BP 130/68 | HR 70 | Ht 68.0 in | Wt 215.2 lb

## 2022-05-06 DIAGNOSIS — R140 Abdominal distension (gaseous): Secondary | ICD-10-CM

## 2022-05-06 DIAGNOSIS — Z9981 Dependence on supplemental oxygen: Secondary | ICD-10-CM | POA: Diagnosis not present

## 2022-05-06 DIAGNOSIS — R194 Change in bowel habit: Secondary | ICD-10-CM | POA: Diagnosis not present

## 2022-05-06 DIAGNOSIS — R159 Full incontinence of feces: Secondary | ICD-10-CM

## 2022-05-06 DIAGNOSIS — Z86010 Personal history of colonic polyps: Secondary | ICD-10-CM | POA: Diagnosis not present

## 2022-05-06 NOTE — Patient Instructions (Signed)
Start a fiber supplement daily such as Benefiber.  We will contact you once we have availability for a hospital colonoscopy.  It was a pleasure to see you today!  Thank you for trusting me with your gastrointestinal care!

## 2022-05-06 NOTE — Progress Notes (Signed)
Chief Complaint: Change in bowel habits, Gas, history of colon polyp  HPI:    Katelyn Cline is a 61 year old Caucasian female with a past medical history as listed below including COPD on oxygen, diabetes, GERD, polycythemia vera, fibromyalgia and multiple others, who was referred to me by [name], MD for a complaint of change in bowel habits, history of colon polyp and gas.      Today, the patient presents to clinic and tells me that 20 years ago she had a colonoscopy and was told that she had a very very large polyp.  When they removed it they told her for sure it was cancer but apparently it turned out not to be.  She was supposed to be followed shortly thereafter but never returned.  Tells me she knows she is overdue for colonoscopy.    Along with above patient tells me she has always had some trouble not knowing what her bowel habits are and what they will do, occasionally she will be constipated and use Senokot which works well for her and other times she will have a very sticky hard to clean stool which has been going on what seems like every day for the past year.  Also describes passing a lot of gas which is always been an issue for her even as a young girl.  Does tell me she eats a diet high in spicy foods, onions and garlic.  Tells me "food is not food without onions".  Occasionally has some leakage of stool sometimes even solid stool but this does not happen all the time.    Denies fever, chills, weight loss, blood in her stool, nausea, vomiting or symptoms that awaken her from sleep.  Past Medical History:  Diagnosis Date   Agoraphobia    Arthritis    "all joints"   Chronic low back pain    Chronic respiratory failure with hypoxia ([name])    4L  via [place],  followed by pcp,   (09-16-2018  per pt only uses while at home and at night ,  portable oxygen is not working)   Clotting disorder ([place]) 2013   Polycythemia vera   COPD (chronic obstructive pulmonary disease) ([place])    DDD  (degenerative disc disease), lumbosacral    Dental caries    DM type 2 (diabetes mellitus, type 2) ([place])    followed by pcp   Fibromyalgia    GERD (gastroesophageal reflux disease)    occasional , no meds   Heart murmur 2020   History of basal cell carcinoma (BCC) excision    s/p  Moh's of face/ nose 12/ 2013   History of palpitations    per pt found to have occaional PVCs   History of UTI    HTN (hypertension)    Hyperlipidemia    Loose, teeth    Major depression    Metastatic melanoma ([place]) 10/18/2012   12 mm posterior right upper lobe pulmonary nodule, max SUV 3.0  s/p  right wedge resection 10-21-2012,  followed by oncologist-- dr Irene Limbo,  no recurrence   Mixed stress and urge urinary incontinence    Myeloproliferative neoplasm ([place])    Narcolepsy    OSA (obstructive sleep apnea)    Oxygen deficiency    Oxygen dependent    4 L via [place],  per pt portable oxygen not working but does use while at home and at night   Panic attacks    Periodontitis    Peripheral neuropathy  feet   Polycythemia vera(238.4) hematology/ oncology-- dr Irene Limbo ([facility])   first dx 02014 due to smoker/copd--  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)  hx phlebotomies   Pulmonary nodules    bilateral small stable per CT 03-11-2018   Scoliosis    Sleep apnea    Smokers' cough ([place])    per pt productive a little in the morning's   Symptoms of upper respiratory infection (URI) 03/12/2018   Wears glasses     Past Surgical History:  Procedure Laterality Date   ABDOMINAL HYSTERECTOMY  1998   partial   BREAST LUMPECTOMY Bilateral 1995   benign per pt   COLONOSCOPY W/ BIOPSIES AND POLYPECTOMY     Hx: of   CYSTECTOMY  2000   abdominal wall    DILATION AND CURETTAGE OF UTERUS  yrs ago   MOHS SURGERY  2013   nose/face   MULTIPLE EXTRACTIONS WITH ALVEOLOPLASTY N/A 09/19/2018   Procedure: Extraction of tooth #'s 2, 3, 6-14, 18, and 20-30 with alveoloplasty;  Surgeon:  [name], DDS;  Location: [facility];  Service: Oral Surgery;  Laterality: N/A;  GENERAL WITH NASAL TUBE   VIDEO ASSISTED THORACOSCOPY (VATS)/WEDGE RESECTION Right 10/21/2012   Procedure: VIDEO ASSISTED THORACOSCOPY (VATS)/WEDGE RESECTION;  Surgeon: [name], MD;  Location: [facility];  Service: Thoracic;  Laterality: Right;   VIDEO BRONCHOSCOPY N/A 10/21/2012   Procedure: VIDEO BRONCHOSCOPY;  Surgeon: [name], MD;  Location: [facility];  Service: Thoracic;  Laterality: N/A;    Current Outpatient Medications  Medication Sig Dispense Refill   amLODipine (NORVASC) 5 MG tablet TAKE ONE TABLET BY MOUTH EVERYDAY AT BEDTIME 90 tablet 1   aspirin EC 81 MG tablet Take 1 tablet (81 mg total) by mouth daily. 90 tablet 1   atorvastatin (LIPITOR) 80 MG tablet TAKE ONE TABLET BY MOUTH EVERYDAY AT BEDTIME 90 tablet 1   benzonatate (TESSALON) 100 MG capsule Take 1 capsule (100 mg total) by mouth 3 (three) times daily as needed for cough. (Patient not taking: Reported on 12/26/2021) 30 capsule 0   Blood Glucose Monitoring Suppl (BLOOD GLUCOSE SYSTEM PAK) KIT Please dispense based on patient and insurance preference. Use as directed to monitor FSBS 2x daily. Dx: E11.9 1 kit 1   buPROPion ER (WELLBUTRIN SR) 100 MG 12 hr tablet TAKE ONE TABLET BY MOUTH EVERY MORNING and TAKE ONE TABLET BY MOUTH EVERYDAY AT BEDTIME 180 tablet 1   DULoxetine (CYMBALTA) 60 MG capsule TAKE TWO CAPSULES BY MOUTH EVERY MORNING 180 capsule 1   fenofibrate (TRICOR) 145 MG tablet TAKE ONE TABLET BY MOUTH EVERYDAY AT BEDTIME 90 tablet 1   fish oil-omega-3 fatty acids 1000 MG capsule Take 2 capsules (2 g total) by mouth 2 (two) times daily. 180 capsule 1   gabapentin (NEURONTIN) 100 MG capsule take 1-3 capsules BY MOUTH three times daily AS NEEDED FOR nerve pain 270 capsule 1   glucose blood (ACCU-CHEK GUIDE) test strip Use as instructed 100 each 12   Glucose Blood (BLOOD GLUCOSE TEST STRIPS) STRP Please dispense based on  patient and insurance preference. Use as directed to monitor FSBS 2x daily. Dx: E11.9 100 strip 1   hydrochlorothiazide (HYDRODIURIL) 25 MG tablet TAKE ONE TABLET BY MOUTH EVERY MORNING 90 tablet 1   hydroxyurea (HYDREA) 500 MG capsule TAKE TWO CAPSULES BY MOUTH EVERYDAY AT BEDTIME 90 capsule 3   lisinopril (ZESTRIL) 5 MG tablet TAKE ONE TABLET BY MOUTH EVERYDAY AT BEDTIME 90 tablet 1  metFORMIN (GLUCOPHAGE) 1000 MG tablet TAKE ONE TABLET BY MOUTH EVERY MORNING and TAKE ONE TABLET BY MOUTH EVERYDAY AT BEDTIME 180 tablet 1   Microlet Lancets MISC Please dispense based on patient and insurance preference. Use as directed to monitor FSBS 2x daily. Dx: E11.9 100 each 11   nystatin (MYCOSTATIN/NYSTOP) powder Apply 1 application topically 3 (three) times daily. PA Case: [id number] Approved 06/30/2019- 06/28/2020 60 g 2   nystatin cream (MYCOSTATIN) Apply 1 application. topically 2 (two) times daily. To affected areas 60 g 2   oxyCODONE-acetaminophen (PERCOCET) 10-325 MG tablet Take 1 tablet by mouth 4 (four) times daily as needed for severe pain.     senna (SENOKOT) 8.6 MG TABS tablet Take 2 tablets by mouth daily as needed for mild constipation.     Vitamin D, Ergocalciferol, (DRISDOL) 1.25 MG (50000 UNIT) CAPS capsule TAKE ONE CAPSULE BY MOUTH on sundays at bedtime 12 capsule 0   No current facility-administered medications for this visit.    Allergies as of 05/06/2022 - Review Complete 05/06/2022  Allergen Reaction Noted   Contrast media [iodinated contrast media] Anaphylaxis 04/08/2012   Erythromycin  10/25/2013   Flagyl [metronidazole] Other (See Comments) 02/16/2012   Latex Other (See Comments) 10/15/2013   Other  04/09/2014   Penicillins Other (See Comments) 02/16/2012   Tetracyclines & related Other (See Comments) 02/16/2012    Family History  Problem Relation Age of Onset   Arthritis Mother    Hyperlipidemia Mother    Depression Mother    Anxiety disorder Mother    Dementia Mother     Hypertension Father    Hyperlipidemia Father    Heart disease Father    Stroke Father    Dementia Father    Heart disease Brother    ADD / ADHD Son    Alcohol abuse Maternal Grandfather    Bipolar disorder Neg Hx    Drug abuse Neg Hx    OCD Neg Hx    Paranoid behavior Neg Hx    Schizophrenia Neg Hx    Seizures Neg Hx    Sexual abuse Neg Hx    Physical abuse Neg Hx     Social History   Socioeconomic History   Marital status: Divorced    Spouse name: Not on file   Number of children: 1   Years of education: 12+   Highest education level: Not on file  Occupational History   Not on file  Tobacco Use   Smoking status: Every Day    Packs/day: 0.50    Years: 50.00    Total pack years: 25.00    Types: Cigarettes   Smokeless tobacco: Never   Tobacco comments:    Quit a few times  Vaping Use   Vaping Use: Former   Quit date: 09/15/2016  Substance and Sexual Activity   Alcohol use: Yes    Alcohol/week: 1.0 standard drink of alcohol    Types: 1 Standard drinks or equivalent per week    Comment: Not weekly   Drug use: Never   Sexual activity: Not Currently    Birth control/protection: Surgical  Other Topics Concern   Not on file  Social History Narrative   Son-lives w/son.   disabled   Social Determinants of Health   Financial Resource Strain: Low Risk  (12/26/2021)   Overall Financial Resource Strain (CARDIA)    Difficulty of Paying Living Expenses: Not hard at all  Food Insecurity: No Food Insecurity (12/26/2021)   Hunger Vital Sign  Worried About Charity fundraiser in the Last Year: Never true    [place] in the Last Year: Never true  Transportation Needs: No Transportation Needs (12/26/2021)   PRAPARE - Hydrologist (Medical): No    Lack of Transportation (Non-Medical): No  Physical Activity: Inactive (12/26/2021)   Exercise Vital Sign    Days of Exercise per Week: 0 days    Minutes of Exercise per Session: 0 min  Stress:  Stress Concern Present (12/26/2021)   [place]    Feeling of Stress : Rather much  Social Connections: Socially Isolated (12/26/2021)   Social Connection and Isolation Panel [NHANES]    Frequency of Communication with Friends and Family: Once a week    Frequency of Social Gatherings with Friends and Family: Once a week    Attends Religious Services: Never    Marine scientist or Organizations: No    Attends Archivist Meetings: Never    Marital Status: Divorced  Human resources officer Violence: Not At Risk (12/26/2021)   Humiliation, Afraid, Rape, and Kick questionnaire    Fear of Current or Ex-Partner: No    Emotionally Abused: No    Physically Abused: No    Sexually Abused: No    Review of Systems:    Constitutional: No weight loss, fever or chills Skin: No rash  Cardiovascular: No chest pain  Respiratory: +chronic SOB Gastrointestinal: See HPI and otherwise negative Genitourinary: No dysuria Neurological: No headache, dizziness or syncope Musculoskeletal: No new muscle or joint pain Hematologic: No bleeding  Psychiatric: No history of depression or anxiety   Physical Exam:  Vital signs: BP 130/68   Pulse 70   Ht _0  (1.727 m)   Wt 215 lb 3.2 oz (97.6 kg)   SpO2 98%   BMI 32.72 kg/m    Constitutional:   Pleasant overweight Caucasian female appears to be in NAD, Well developed, Well nourished, alert and cooperative Head:  Normocephalic and atraumatic. Eyes:   PEERL, EOMI. No icterus. Conjunctiva pink. Ears:  Normal auditory acuity. Neck:  Supple Throat: Oral cavity and pharynx without inflammation, swelling or lesion.  Respiratory: Respirations even and unlabored. Lungs clear to auscultation bilaterally.   No wheezes, crackles, or rhonchi. +on O2 via [place] Cardiovascular: Normal S1, S2. No MRG. Regular rate and rhythm. No peripheral edema, cyanosis or pallor.  Gastrointestinal:  Soft,  nondistended, nontender. No rebound or guarding. Normal bowel sounds. No appreciable masses or hepatomegaly. Rectal:  Not performed.  Msk:  Symmetrical without gross deformities. Without edema, no deformity or joint abnormality. +uses cane to ambulate Neurologic:  Alert and  oriented x4;  grossly normal neurologically.  Skin:   Dry and intact without significant lesions or rashes. Psychiatric:  Demonstrates good judgement and reason without abnormal affect or behaviors.  RELEVANT LABS AND IMAGING: CBC    Component Value Date/Time   WBC 20.6 (H) 10/29/2021 0934   RBC 5.20 (H) 10/29/2021 0934   HGB 13.2 10/29/2021 0934   HGB 15.1 (H) 01/24/2021 1158   HCT 45.5 10/29/2021 0934   PLT 628 (H) 10/29/2021 0934   PLT 209 01/24/2021 1158   MCV 87.5 10/29/2021 0934   MCH 25.4 (L) 10/29/2021 0934   MCHC 29.0 (L) 10/29/2021 0934   RDW 21.2 (H) 10/29/2021 0934   LYMPHSABS 1.5 10/29/2021 0934   MONOABS 0.6 10/29/2021 0934   EOSABS 0.6 (H) 10/29/2021 0934   BASOSABS  0.3 (H) 10/29/2021 0934    CMP     Component Value Date/Time   NA 140 10/29/2021 0934   K 4.4 10/29/2021 0934   CL 103 10/29/2021 0934   CO2 28 10/29/2021 0934   GLUCOSE 200 (H) 10/29/2021 0934   BUN 20 10/29/2021 0934   CREATININE 0.83 10/29/2021 0934   CREATININE 0.71 06/16/2019 1200   CALCIUM 9.1 10/29/2021 0934   PROT 7.8 10/29/2021 0934   ALBUMIN 3.6 10/29/2021 0934   AST 18 10/29/2021 0934   ALT 17 10/29/2021 0934   ALKPHOS 127 (H) 10/29/2021 0934   BILITOT 0.5 10/29/2021 0934   GFRNONAA >60 10/29/2021 0934   GFRNONAA 95 04/28/2018 0854   GFRAA >60 01/12/2020 0959   GFRAA 110 04/28/2018 0854    Assessment: 1.  History of colon polyps: Per report patient describes colonoscopy 20 years ago with a very large polyp (she does not know exactly where and we cannot get this report), overdue for surveillance 2.  Change in bowel habits: Alternating bowel habits for what sounds like years, likely related to diet and  medications 3.  Gas: Likely related to diet which is high in onions, spicy food and garlic 4.  Fecal leakage: Occasional episodes of fecal leakage; consider relation to hemorrhoids +/- decreased sphincter tone 5.  COPD on O2  Plan: 1.  Due to Oxygen use patient has to be scheduled at the hospital for her surveillance colonoscopy given history of large polyp (cannot get report).  This will be scheduled with Dr. Loletha Carrow at his next availability.  Did provide the patient a detailed list of risks for the procedure and she agrees to proceed. 2.  Discussed adding a fiber supplement such as Benefiber once to twice daily to help with fecal leakage and alternation in bowel habits for now. 3.  Discussed gas, most likely this is related to her diet which is high in onions spicy food and garlic.  She is unwilling to stop these. 4.  Patient to follow in clinic per recommendations from Dr. Loletha Carrow after time of procedure.  Ellouise Newer, PA-C [facility] 05/06/2022, 8:40 AM  Cc: [name], MD

## 2022-05-07 ENCOUNTER — Other Ambulatory Visit: Payer: Self-pay | Admitting: Family Medicine

## 2022-05-12 NOTE — Progress Notes (Signed)
____________________________________________________________  Attending physician addendum:  Thank you for sending this case to me. I have reviewed the entire note and agree with the plan.  Currently booking out at least 3 months out for routine hospital-based procedures.  Wilfrid Lund, MD  ____________________________________________________________

## 2022-05-18 ENCOUNTER — Encounter: Payer: Self-pay | Admitting: Family Medicine

## 2022-05-18 ENCOUNTER — Ambulatory Visit (INDEPENDENT_AMBULATORY_CARE_PROVIDER_SITE_OTHER): Payer: Medicare HMO | Admitting: Family Medicine

## 2022-05-18 VITALS — BP 135/75 | HR 63 | Temp 98.3°F | Ht 68.0 in | Wt 216.2 lb

## 2022-05-18 DIAGNOSIS — E1140 Type 2 diabetes mellitus with diabetic neuropathy, unspecified: Secondary | ICD-10-CM

## 2022-05-18 DIAGNOSIS — I10 Essential (primary) hypertension: Secondary | ICD-10-CM

## 2022-05-18 DIAGNOSIS — E782 Mixed hyperlipidemia: Secondary | ICD-10-CM | POA: Diagnosis not present

## 2022-05-18 DIAGNOSIS — J9611 Chronic respiratory failure with hypoxia: Secondary | ICD-10-CM

## 2022-05-18 DIAGNOSIS — Z23 Encounter for immunization: Secondary | ICD-10-CM | POA: Diagnosis not present

## 2022-05-18 DIAGNOSIS — E559 Vitamin D deficiency, unspecified: Secondary | ICD-10-CM | POA: Diagnosis not present

## 2022-05-18 DIAGNOSIS — R599 Enlarged lymph nodes, unspecified: Secondary | ICD-10-CM | POA: Diagnosis not present

## 2022-05-18 DIAGNOSIS — F17200 Nicotine dependence, unspecified, uncomplicated: Secondary | ICD-10-CM

## 2022-05-18 DIAGNOSIS — F4310 Post-traumatic stress disorder, unspecified: Secondary | ICD-10-CM

## 2022-05-18 LAB — LIPID PANEL
Cholesterol: 138 mg/dL (ref 0–200)
HDL: 38.5 mg/dL — ABNORMAL LOW (ref >39.00)
LDL Cholesterol: 72 mg/dL (ref 0–99)
NonHDL: 99.18
Total CHOL/HDL Ratio: 4
Triglycerides: 137 mg/dL (ref 0.0–149.0)
VLDL: 27.4 mg/dL (ref 0.0–40.0)

## 2022-05-18 LAB — COMPREHENSIVE METABOLIC PANEL
ALT: 9 U/L (ref 0–35)
AST: 14 U/L (ref 0–37)
Albumin: 3.9 g/dL (ref 3.5–5.2)
Alkaline Phosphatase: 89 U/L (ref 39–117)
BUN: 21 mg/dL (ref 6–23)
CO2: 28 mEq/L (ref 19–32)
Calcium: 8.8 mg/dL (ref 8.4–10.5)
Chloride: 103 mEq/L (ref 96–112)
Creatinine, Ser: 0.59 mg/dL (ref 0.40–1.20)
GFR: 95.75 mL/min (ref >60.00)
Glucose, Bld: 148 mg/dL — ABNORMAL HIGH (ref 70–99)
Potassium: 4.3 mEq/L (ref 3.5–5.1)
Sodium: 140 mEq/L (ref 135–145)
Total Bilirubin: 0.4 mg/dL (ref 0.2–1.2)
Total Protein: 6.8 g/dL (ref 6.0–8.3)

## 2022-05-18 LAB — VITAMIN D 25 HYDROXY (VIT D DEFICIENCY, FRACTURES): VITD: 33.67 ng/mL (ref 30.00–100.00)

## 2022-05-18 LAB — VITAMIN B12: Vitamin B-12: 363 pg/mL (ref 211–911)

## 2022-05-18 LAB — HEMOGLOBIN A1C: Hgb A1c MFr Bld: 6.4 % (ref 4.6–6.5)

## 2022-05-18 LAB — POCT RAPID STREP A (OFFICE): Rapid Strep A Screen: NEGATIVE

## 2022-05-18 MED ORDER — HYDROCHLOROTHIAZIDE 25 MG PO TABS: 25.0000 mg | ORAL_TABLET | Freq: Every morning | ORAL | 1 refills | Status: DC

## 2022-05-18 MED ORDER — DULOXETINE HCL 60 MG PO CPEP: ORAL_CAPSULE | ORAL | 1 refills | Status: DC

## 2022-05-18 MED ORDER — NYSTATIN 100000 UNIT/GM EX POWD: 1.0000 | Freq: Three times a day (TID) | CUTANEOUS | 2 refills | Status: DC

## 2022-05-18 MED ORDER — NYSTATIN 100000 UNIT/GM EX CREA: 1.0000 | TOPICAL_CREAM | Freq: Two times a day (BID) | CUTANEOUS | 2 refills | Status: DC

## 2022-05-18 MED ORDER — ATORVASTATIN CALCIUM 80 MG PO TABS: ORAL_TABLET | ORAL | 1 refills | Status: DC

## 2022-05-18 MED ORDER — FENOFIBRATE 145 MG PO TABS: 145.0000 mg | ORAL_TABLET | Freq: Every day | ORAL | 1 refills | Status: DC

## 2022-05-18 MED ORDER — AMLODIPINE BESYLATE 5 MG PO TABS: ORAL_TABLET | ORAL | 1 refills | Status: DC

## 2022-05-18 MED ORDER — LISINOPRIL 5 MG PO TABS: ORAL_TABLET | ORAL | 1 refills | Status: DC

## 2022-05-18 MED ORDER — BUPROPION HCL ER (SR) 100 MG PO TB12: ORAL_TABLET | ORAL | 1 refills | Status: DC

## 2022-05-18 MED ORDER — METFORMIN HCL 1000 MG PO TABS: ORAL_TABLET | ORAL | 1 refills | Status: DC

## 2022-05-18 MED ORDER — VITAMIN D (ERGOCALCIFEROL) 1.25 MG (50000 UNIT) PO CAPS: ORAL_CAPSULE | ORAL | 1 refills | Status: DC

## 2022-05-18 NOTE — Patient Instructions (Addendum)
It was very nice to see you today!  At [facility] pharmacy(or any pharmacy)  covid and rsv.   Then shingles #2 1 month from now or later.   Happy Holidays!   PLEASE NOTE:  If you had any lab tests please let us know if you have not heard back within a few days. You may see your results on MyChart before we have a chance to review them but we will give you a call once they are reviewed by [place]. If we ordered any referrals today, please let us know if you have not heard from their office within the next week.   Please try these tips to maintain a healthy lifestyle:  Eat most of your calories during the day when you are active. Eliminate processed foods including packaged sweets (pies, cakes, cookies), reduce intake of potatoes, white bread, white pasta, and white rice. Look for whole grain options, oat flour or almond flour.  Each meal should contain half fruits/vegetables, one quarter protein, and one quarter carbs (no bigger than a computer mouse).  Cut down on sweet beverages. This includes juice, soda, and sweet tea. Also watch fruit intake, though this is a healthier sweet option, it still contains natural sugar! Limit to 3 servings daily.  Drink at least 1 glass of water with each meal and aim for at least 8 glasses per day  Exercise at least 150 minutes every week.

## 2022-05-18 NOTE — Progress Notes (Signed)
Subjective:     Patient ID: Katelyn Cline, female    DOB: 09-27-1960, 61 y.o.   MRN: [id number]  Chief Complaint  Patient presents with   Follow-up    6 month follow-up for HTN and dm Fasting     HPI  DM type 2 w/neuropathy(but more from pain, etc)-on metformin 1000 bid.  135-150 sugars. Eating a lot of tomatoes. HTN-Pt is on amlodipine 15m, lisinopril 559mand hctz 2534m Bp's running checks at pain mgmt monthly 130's/70's.  No cp/palp.  Occ vertigo. Occ HA-pain in neck. Min swelling in R foot(old).   HLD-mixed.  On atorvastatin 63m43md fenofibrate 145mg34m fish oil PV-on hydrea.  Managed by heme-seeing tomorrow.  Depression-on wellbutrin/cymbalta.  No SI. stable. Some agoraphobia.   Vitamin D def-on 50k weekly Chronic hypoxia-s/p lung resection for melanoma.  Still smoking Getting cscope.  Health Maintenance Due  Topic Date Due   COLONOSCOPY (Pts 45-63yrs65yrrance coverage will need to be confirmed)  Never done   OPHTHALMOLOGY EXAM  09/07/2018   Lung Cancer Screening  03/12/2019   MAMMOGRAM  04/21/2019    Past Medical History:  Diagnosis Date   Agoraphobia    Anxiety    Arthritis    "all joints"   Chronic low back pain    Chronic respiratory failure with hypoxia ([name])    4L  via [place],  followed by pcp,   (09-16-2018  per pt only uses while at home and at night ,  portable oxygen is not working)   Clotting disorder ([name]) 2[place]   Polycythemia vera   Colon polyps    COPD (chronic obstructive pulmonary disease) ([name])  [place] (degenerative disc disease), lumbosacral    Dental caries    DM type 2 (diabetes mellitus, type 2) ([name])  [place]   Fibromyalgia    GERD (gastroesophageal reflux disease)    occasional , no meds   Heart murmur 2020   History of basal cell carcinoma (BCC) excision    s/p  Moh's of face/ nose 12/ 2013   History of palpitations    per pt found to have occaional PVCs   History of UTI    HTN (hypertension)    Hyperlipidemia     Loose, teeth    Major depression    Metastatic melanoma ([name]) 0[place]2/2014   12 mm posterior right upper lobe pulmonary nodule, max SUV 3.0  s/p  right wedge resection 10-21-2012,  followed by oncologist-- dr kale, [facility]   Mixed stress and urge urinary incontinence    Myeloproliferative neoplasm ([name])  [place]    Obesity    OSA (obstructive sleep apnea)    Oxygen deficiency    Oxygen dependent    4 L via [place],  per pt portable oxygen not working but does use while at home and at night   Panic attacks    Periodontitis    Peripheral neuropathy    feet   Polycythemia vera(238.4) hematology/ oncology-- dr kale ([facility])   first dx 02014 due to smoker/copd--  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)  hx phlebotomies   Pulmonary nodules    bilateral small stable per CT 03-11-2018   Scoliosis    Sleep apnea    Smokers' cough ([name])  [place] pt productive a little in the morning's   Symptoms of upper respiratory infection (URI) 03/12/2018   Urinary (tract) obstruction    Wears glasses  Past Surgical History:  Procedure Laterality Date   ABDOMINAL HYSTERECTOMY  1998   partial   BREAST LUMPECTOMY Bilateral 1995   benign per pt   COLONOSCOPY W/ BIOPSIES AND POLYPECTOMY     Hx: of   CYSTECTOMY  2000   abdominal wall    DILATION AND CURETTAGE OF UTERUS  yrs ago   MOHS SURGERY  2013   nose/face   MULTIPLE EXTRACTIONS WITH ALVEOLOPLASTY N/A 09/19/2018   Procedure: Extraction of tooth #'s 2, 3, 6-14, 18, and 20-30 with alveoloplasty;  Surgeon: [name], DDS;  Location: [facility];  Service: Oral Surgery;  Laterality: N/A;  GENERAL WITH NASAL TUBE   VIDEO ASSISTED THORACOSCOPY (VATS)/WEDGE RESECTION Right 10/21/2012   Procedure: VIDEO ASSISTED THORACOSCOPY (VATS)/WEDGE RESECTION;  Surgeon: [name], MD;  Location: [place];  Service: Thoracic;  Laterality: Right;   VIDEO BRONCHOSCOPY N/A 10/21/2012   Procedure: VIDEO BRONCHOSCOPY;   Surgeon: [name], MD;  Location: [facility];  Service: Thoracic;  Laterality: N/A;    Outpatient Medications Prior to Visit  Medication Sig Dispense Refill   aspirin EC 81 MG tablet Take 1 tablet (81 mg total) by mouth daily. 90 tablet 1   baclofen (LIORESAL) 20 MG tablet Take 20 mg by mouth 2 (two) times daily as needed.     Blood Glucose Monitoring Suppl (BLOOD GLUCOSE SYSTEM PAK) KIT Please dispense based on patient and insurance preference. Use as directed to monitor FSBS 2x daily. Dx: E11.9 1 kit 1   fish oil-omega-3 fatty acids 1000 MG capsule Take 2 capsules (2 g total) by mouth 2 (two) times daily. 180 capsule 1   gabapentin (NEURONTIN) 100 MG capsule take 1-3 capsules BY MOUTH three times daily AS NEEDED FOR nerve pain 270 capsule 1   glucose blood (ACCU-CHEK GUIDE) test strip Use as instructed 100 each 12   Glucose Blood (BLOOD GLUCOSE TEST STRIPS) STRP Please dispense based on patient and insurance preference. Use as directed to monitor FSBS 2x daily. Dx: E11.9 100 strip 1   hydroxyurea (HYDREA) 500 MG capsule TAKE TWO CAPSULES BY MOUTH EVERYDAY AT BEDTIME 90 capsule 3   Microlet Lancets MISC Please dispense based on patient and insurance preference. Use as directed to monitor FSBS 2x daily. Dx: E11.9 100 each 11   oxyCODONE-acetaminophen (PERCOCET) 10-325 MG tablet Take 1 tablet by mouth 4 (four) times daily as needed for severe pain.     senna (SENOKOT) 8.6 MG TABS tablet Take 2 tablets by mouth daily as needed for mild constipation.     amLODipine (NORVASC) 5 MG tablet TAKE ONE TABLET BY MOUTH EVERYDAY AT BEDTIME 90 tablet 1   atorvastatin (LIPITOR) 80 MG tablet TAKE ONE TABLET BY MOUTH EVERYDAY AT BEDTIME 90 tablet 1   buPROPion ER (WELLBUTRIN SR) 100 MG 12 hr tablet TAKE ONE TABLET BY MOUTH EVERY MORNING and TAKE ONE TABLET BY MOUTH EVERYDAY AT BEDTIME 180 tablet 1   DULoxetine (CYMBALTA) 60 MG capsule TAKE TWO CAPSULES BY MOUTH EVERY MORNING 180 capsule 1   fenofibrate  (TRICOR) 145 MG tablet TAKE ONE TABLET BY MOUTH EVERYDAY AT BEDTIME 90 tablet 1   hydrochlorothiazide (HYDRODIURIL) 25 MG tablet TAKE ONE TABLET BY MOUTH EVERY MORNING 90 tablet 1   lisinopril (ZESTRIL) 5 MG tablet TAKE ONE TABLET BY MOUTH EVERYDAY AT BEDTIME 90 tablet 1   metFORMIN (GLUCOPHAGE) 1000 MG tablet TAKE ONE TABLET BY MOUTH EVERY MORNING and TAKE ONE TABLET BY MOUTH EVERYDAY AT BEDTIME 180 tablet  1   nystatin (MYCOSTATIN/NYSTOP) powder Apply 1 application topically 3 (three) times daily. PA Case: [id number] Approved 06/30/2019- 06/28/2020 60 g 2   nystatin cream (MYCOSTATIN) Apply 1 application. topically 2 (two) times daily. To affected areas 60 g 2   Vitamin D, Ergocalciferol, (DRISDOL) 1.25 MG (50000 UNIT) CAPS capsule TAKE ONE TABLET BY MOUTH on sundays at bedtime 12 capsule 0   benzonatate (TESSALON) 100 MG capsule Take 1 capsule (100 mg total) by mouth 3 (three) times daily as needed for cough. (Patient not taking: Reported on 12/26/2021) 30 capsule 0   No facility-administered medications prior to visit.    Allergies  Allergen Reactions   Contrast Media [Iodinated Contrast Media] Anaphylaxis   Erythromycin     Stomach pain   Flagyl [Metronidazole] Other (See Comments)    Generalized pain.   Latex Other (See Comments)    Was told to be careful because of Dye allergy   Other     Iodine Dye   Penicillins Other (See Comments)    Patient was an infant, no idea of reaction. Tolerates Keflex. Did it involve swelling of the face/tongue/throat, SOB, or low BP? Unknown Did it involve sudden or severe rash/hives, skin peeling, or any reaction on the inside of your mouth or nose? Unknown Did you need to seek medical attention at a hospital or doctor's office? Unknown When did it last happen?      infant If all above answers are "NO", may proceed with cephalosporin use.    Tetracyclines & Related Other (See Comments)    GI side effects   ROS neg/noncontributory except as noted  HPI/below      Objective:     BP 135/75   Pulse 63   Temp 98.3 F (36.8 C) (Temporal)   Ht _0  (1.727 m)   Wt 216 lb 4 oz (98.1 kg)   SpO2 98%   BMI 32.88 kg/m  Wt Readings from Last 3 Encounters:  05/18/22 216 lb 4 oz (98.1 kg)  05/06/22 215 lb 3.2 oz (97.6 kg)  11/14/21 223 lb 4 oz (101.3 kg)    Physical Exam   Gen: WDWN NAD owf on O2 HEENT: NCAT, conjunctiva not injected, sclera nonicteric +xanthaloma B medial upper lids  OP normal NECK:  supple, no thyromegaly, + tender submand nodes, no carotid bruits CARDIAC: RRR, S1S2+, 2/6 sys murmur. DP 1+B LUNGS: CTAB. Distant. No wheezes ABDOMEN:  BS+, soft, NTND, No HSM, no masses EXT:  tr edema MSK: no gross abnormalities.  NEURO: A&O x3.  CN II-XII intact.  PSYCH: normal mood. Good eye contact  53mn w/pt-renewing meds, history, plan. complexity  Diabetic Foot Exam - Simple   Simple Foot Form Diabetic Foot exam was performed with the following findings: Yes 05/18/2022  9:42 AM  Visual Inspection See comments: Yes Sensation Testing Intact to touch and monofilament testing bilaterally: Yes Pulse Check Posterior Tibialis and Dorsalis pulse intact bilaterally: Yes Comments Ruddy feet.  Some fungus.           Assessment & Plan:   Problem List Items Addressed This Visit       Cardiovascular and Mediastinum   Essential hypertension, benign   Relevant Medications   amLODipine (NORVASC) 5 MG tablet   atorvastatin (LIPITOR) 80 MG tablet   fenofibrate (TRICOR) 145 MG tablet   hydrochlorothiazide (HYDRODIURIL) 25 MG tablet   lisinopril (ZESTRIL) 5 MG tablet     Respiratory   Chronic hypoxemic respiratory failure ([name])  Endocrine   Type 2 diabetes mellitus with diabetic neuropathy ([name]) - Primary   Relevant Medications   atorvastatin (LIPITOR) 80 MG tablet   lisinopril (ZESTRIL) 5 MG tablet   metFORMIN (GLUCOPHAGE) 1000 MG tablet   Other Relevant Orders   Comprehensive metabolic panel (Completed)    Hemoglobin A1c (Completed)   Lipid panel (Completed)   Vitamin B12 (Completed)     Other   Hyperlipidemia   Relevant Medications   amLODipine (NORVASC) 5 MG tablet   atorvastatin (LIPITOR) 80 MG tablet   fenofibrate (TRICOR) 145 MG tablet   hydrochlorothiazide (HYDRODIURIL) 25 MG tablet   lisinopril (ZESTRIL) 5 MG tablet   Other Relevant Orders   Comprehensive metabolic panel (Completed)   Lipid panel (Completed)   Posttraumatic stress disorder   Relevant Medications   buPROPion ER (WELLBUTRIN SR) 100 MG 12 hr tablet   DULoxetine (CYMBALTA) 60 MG capsule   Vitamin D deficiency   Relevant Orders   VITAMIN D 25 Hydroxy (Vit-D Deficiency, Fractures) (Completed)   Other Visit Diagnoses     Glands swollen       Relevant Orders   POCT rapid strep A (Completed)   Need for immunization against influenza       Relevant Orders   Flu Vaccine QUAD 26moIM (Fluarix, Fluzone & Alfiuria Quad PF) (Completed)   Smoker       Relevant Orders   Low Dose CT Chest w/o Contrast for Lung Cancer Screening [[[id number]]    1.  Type 2 diabetes-chronic.  Controlled.  Check CMP, A1c, B12.  Continue metformin 1000 mg twice daily.  Patient is on a statin and ACE.  Follow-up in 6 months 2.  Hypertension-chronic.  Well-controlled.  Renewed amlodipine 5 mg hydrochlorothiazide 25 mg lisinopril 5 mg.  Check BMP 3.  Hyperlipidemia-mixed.  Chronic.  Not ideally controlled.  Continue atorvastatin 80 mg fenofibrate 145 mg and fish oil.  Check lipids, CMP 4.  Polycythemia vera/MDS-managed by hematology.  Continue Hydrea.  They will be checking CBC and iron studies. 5.  Vitamin D deficiency-chronic.  Controlled on vitamin D 50,000 units weekly.  Renewed.  Check vitamin D levels 6.  Depression/PTSD/neuropathy-chronic.  Controlled on bupropion 100 mg twice daily and Cymbalta 60 mg twice daily.  Continue.  She still has agoraphobia which limits her ability to get to doctors appointments.  Follow-up in 6 months 7.  Chronic  respiratory failure-multifactorial.  Smoker (advised to quit), history of metastatic melanoma.  On home O2.  Continue. 8.  Lung cancer screening-ordered low-dose CT 9.  Mildly swollen submandibular glands-chronic per patient, however a little bit sore today.  Strep test was negative.  Monitor for now  Follow-up in 6 months-will schedule for 1 hour as needs her annual physical and follow-up of all her problems.  Cannot come in sooner due to agoraphobia.  We will try to get mammogram done the same day as her lung cancer screening.  Meds ordered this encounter  Medications   amLODipine (NORVASC) 5 MG tablet    Sig: TAKE ONE TABLET BY MOUTH EVERYDAY AT BEDTIME    Dispense:  90 tablet    Refill:  1    This prescription was filled on 04/10/2022. Any refills authorized will be placed on file.   atorvastatin (LIPITOR) 80 MG tablet    Sig: TAKE ONE TABLET BY MOUTH EVERYDAY AT BEDTIME    Dispense:  90 tablet    Refill:  1    This prescription was filled on 04/10/2022. Any  refills authorized will be placed on file.   buPROPion ER (WELLBUTRIN SR) 100 MG 12 hr tablet    Sig: TAKE ONE TABLET BY MOUTH EVERY MORNING and TAKE ONE TABLET BY MOUTH EVERYDAY AT BEDTIME    Dispense:  180 tablet    Refill:  1    This prescription was filled on 04/10/2022. Any refills authorized will be placed on file.   DULoxetine (CYMBALTA) 60 MG capsule    Sig: 1 tab po bid    Dispense:  180 capsule    Refill:  1    This prescription was filled on 04/10/2022. Any refills authorized will be placed on file.   fenofibrate (TRICOR) 145 MG tablet    Sig: Take 1 tablet (145 mg total) by mouth daily.    Dispense:  90 tablet    Refill:  1    This prescription was filled on 04/10/2022. Any refills authorized will be placed on file.   hydrochlorothiazide (HYDRODIURIL) 25 MG tablet    Sig: Take 1 tablet (25 mg total) by mouth every morning.    Dispense:  90 tablet    Refill:  1    This prescription was filled on 04/10/2022. Any  refills authorized will be placed on file.   lisinopril (ZESTRIL) 5 MG tablet    Sig: TAKE ONE TABLET BY MOUTH EVERYDAY AT BEDTIME    Dispense:  90 tablet    Refill:  1    This prescription was filled on 04/10/2022. Any refills authorized will be placed on file.   metFORMIN (GLUCOPHAGE) 1000 MG tablet    Sig: TAKE ONE TABLET BY MOUTH EVERY MORNING and TAKE ONE TABLET BY MOUTH EVERYDAY AT BEDTIME    Dispense:  180 tablet    Refill:  1    This prescription was filled on 04/10/2022. Any refills authorized will be placed on file.   nystatin (MYCOSTATIN/NYSTOP) powder    Sig: Apply 1 Application topically 3 (three) times daily. PA Case: [id number] Approved 06/30/2019- 06/28/2020    Dispense:  60 g    Refill:  2   nystatin cream (MYCOSTATIN)    Sig: Apply 1 Application topically 2 (two) times daily. To affected areas    Dispense:  60 g    Refill:  2   Vitamin D, Ergocalciferol, (DRISDOL) 1.25 MG (50000 UNIT) CAPS capsule    Sig: TAKE ONE TABLET BY MOUTH on sundays at bedtime    Dispense:  12 capsule    Refill:  1    Wellington Hampshire, MD

## 2022-05-19 ENCOUNTER — Other Ambulatory Visit: Payer: Self-pay

## 2022-05-19 ENCOUNTER — Inpatient Hospital Stay (HOSPITAL_BASED_OUTPATIENT_CLINIC_OR_DEPARTMENT_OTHER): Payer: Medicare HMO | Admitting: Hematology

## 2022-05-19 ENCOUNTER — Inpatient Hospital Stay: Payer: Medicare HMO | Attending: Hematology

## 2022-05-19 ENCOUNTER — Inpatient Hospital Stay: Payer: Medicare HMO

## 2022-05-19 VITALS — BP 151/68 | HR 74 | Temp 97.9°F | Resp 16 | Wt 214.9 lb

## 2022-05-19 DIAGNOSIS — Z85820 Personal history of malignant melanoma of skin: Secondary | ICD-10-CM | POA: Diagnosis not present

## 2022-05-19 DIAGNOSIS — D45 Polycythemia vera: Secondary | ICD-10-CM | POA: Insufficient documentation

## 2022-05-19 DIAGNOSIS — F17210 Nicotine dependence, cigarettes, uncomplicated: Secondary | ICD-10-CM | POA: Insufficient documentation

## 2022-05-19 DIAGNOSIS — Z79899 Other long term (current) drug therapy: Secondary | ICD-10-CM | POA: Insufficient documentation

## 2022-05-19 DIAGNOSIS — D471 Chronic myeloproliferative disease: Secondary | ICD-10-CM

## 2022-05-19 LAB — CMP (CANCER CENTER ONLY)
ALT: 11 U/L (ref 0–44)
AST: 12 U/L — ABNORMAL LOW (ref 15–41)
Albumin: 4.1 g/dL (ref 3.5–5.0)
Alkaline Phosphatase: 90 U/L (ref 38–126)
Anion gap: 7 (ref 5–15)
BUN: 18 mg/dL (ref 8–23)
CO2: 27 mmol/L (ref 22–32)
Calcium: 9.6 mg/dL (ref 8.9–10.3)
Chloride: 104 mmol/L (ref 98–111)
Creatinine: 0.49 mg/dL (ref 0.44–1.00)
GFR, Estimated: >60 mL/min (ref >60)
Glucose, Bld: 135 mg/dL — ABNORMAL HIGH (ref 70–99)
Potassium: 4.2 mmol/L (ref 3.5–5.1)
Sodium: 138 mmol/L (ref 135–145)
Total Bilirubin: 0.4 mg/dL (ref 0.3–1.2)
Total Protein: 7.2 g/dL (ref 6.5–8.1)

## 2022-05-19 LAB — CBC WITH DIFFERENTIAL (CANCER CENTER ONLY)
Abs Immature Granulocytes: 0.08 10*3/uL — ABNORMAL HIGH (ref 0.00–0.07)
Basophils Absolute: 0.2 10*3/uL — ABNORMAL HIGH (ref 0.0–0.1)
Basophils Relative: 1 %
Eosinophils Absolute: 0.3 10*3/uL (ref 0.0–0.5)
Eosinophils Relative: 2 %
HCT: 47.6 % — ABNORMAL HIGH (ref 36.0–46.0)
Hemoglobin: 14.3 g/dL (ref 12.0–15.0)
Immature Granulocytes: 1 %
Lymphocytes Relative: 10 %
Lymphs Abs: 1.3 10*3/uL (ref 0.7–4.0)
MCH: 26 pg (ref 26.0–34.0)
MCHC: 30 g/dL (ref 30.0–36.0)
MCV: 86.4 fL (ref 80.0–100.0)
Monocytes Absolute: 0.4 10*3/uL (ref 0.1–1.0)
Monocytes Relative: 3 %
Neutro Abs: 10.6 10*3/uL — ABNORMAL HIGH (ref 1.7–7.7)
Neutrophils Relative %: 83 %
Platelet Count: 261 10*3/uL (ref 150–400)
RBC: 5.51 MIL/uL — ABNORMAL HIGH (ref 3.87–5.11)
RDW: 19 % — ABNORMAL HIGH (ref 11.5–15.5)
WBC Count: 12.8 10*3/uL — ABNORMAL HIGH (ref 4.0–10.5)
nRBC: 0 % (ref 0.0–0.2)

## 2022-05-19 LAB — FERRITIN: Ferritin: 12 ng/mL (ref 11–307)

## 2022-05-19 LAB — IRON AND IRON BINDING CAPACITY (CC-WL,HP ONLY)
Iron: 30 ug/dL (ref 28–170)
Saturation Ratios: 6 % — ABNORMAL LOW (ref 10.4–31.8)
TIBC: 533 ug/dL — ABNORMAL HIGH (ref 250–450)
UIBC: 503 ug/dL — ABNORMAL HIGH (ref 148–442)

## 2022-05-19 NOTE — Progress Notes (Signed)
HEMATOLOGY/ONCOLOGY CLINIC NOTE  Date of Service: 05/19/22   Patient Care Team: [name], MD as PCP - General (Family Medicine) [name], MD as Consulting Physician (Hematology) Katelyn Cline, [facility] as Pharmacist (Pharmacist)  CHIEF COMPLAINTS/PURPOSE OF CONSULTATION:  Evaluation and management of polycythemia vera  HISTORY OF PRESENTING ILLNESS:  Please see previous note for details of initial presentation  INTERVAL HISTORY:   Katelyn Cline is here for continued evaluation and management of her polycythemia vera.   Patient was last seen by me on 10/29/2021 and was doing well without any new medical concerns.  She reports she has been doing fairly well since our last visit. She reports she was feeling nauseous around 2-3 days ago and notes her son complained of the same symptoms. She reports she had higher temperature than her normal body temperature yesterday.   She denies fever, chills, back pain, abdominal pain, and abnormal bowel moments during today's visit. However, she notes her right leg occasionally swells up, but elevation helps reduce the swelling.   She regularly takes hydroxyurea and does not report any toxicities.   She has received her influenza vaccine and COVID-19 Booster, but denies RSV vaccine. She notes she still needs 1 of the Shingles vaccine. She notes she has received her Pneumonia vaccine, but it is not in our system.   She reports she still smokes cigarettes around one pack a day. Patient quit for a while, but has started again. She notes that she started smoking again because of feeling mildly depressed. She reports she started smoking cigarettes at the age of 76.   MEDICAL HISTORY:  Past Medical History:  Diagnosis Date   Agoraphobia    Anxiety    Arthritis    "all joints"   Chronic low back pain    Chronic respiratory failure with hypoxia ([name])    4L  via [place],  followed by pcp,   (09-16-2018  per pt only uses while  at home and at night ,  portable oxygen is not working)   Clotting disorder ([place]) 2013   Polycythemia vera   Colon polyps    COPD (chronic obstructive pulmonary disease) ([place])    DDD (degenerative disc disease), lumbosacral    Dental caries    DM type 2 (diabetes mellitus, type 2) ([name])    followed by pcp   Fibromyalgia    GERD (gastroesophageal reflux disease)    occasional , no meds   Heart murmur 2020   History of basal cell carcinoma (BCC) excision    s/p  Moh's of face/ nose 12/ 2013   History of palpitations    per pt found to have occaional PVCs   History of UTI    HTN (hypertension)    Hyperlipidemia    Loose, teeth    Major depression    Metastatic melanoma ([name]) 10/18/2012   12 mm posterior right upper lobe pulmonary nodule, max SUV 3.0  s/p  right wedge resection 10-21-2012,  followed by oncologist-- dr Irene Limbo,  no recurrence   Mixed stress and urge urinary incontinence    Myeloproliferative neoplasm ([place])    Narcolepsy    Obesity    OSA (obstructive sleep apnea)    Oxygen deficiency    Oxygen dependent    4 L via [name],  per pt portable oxygen not working but does use while at home and at night   Panic attacks    Periodontitis    Peripheral neuropathy  feet   Polycythemia vera(238.4) hematology/ oncology-- dr Irene Limbo ([facility])   first dx 02014 due to smoker/copd--  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)  hx phlebotomies   Pulmonary nodules    bilateral small stable per CT 03-11-2018   Scoliosis    Sleep apnea    Smokers' cough ([place])    per pt productive a little in the morning's   Symptoms of upper respiratory infection (URI) 03/12/2018   Urinary (tract) obstruction    Wears glasses     SURGICAL HISTORY: Past Surgical History:  Procedure Laterality Date   ABDOMINAL HYSTERECTOMY  1998   partial   BREAST LUMPECTOMY Bilateral 1995   benign per pt   COLONOSCOPY W/ BIOPSIES AND POLYPECTOMY     Hx: of   CYSTECTOMY  2000    abdominal wall    DILATION AND CURETTAGE OF UTERUS  yrs ago   MOHS SURGERY  2013   nose/face   MULTIPLE EXTRACTIONS WITH ALVEOLOPLASTY N/A 09/19/2018   Procedure: Extraction of tooth #'s 2, 3, 6-14, 18, and 20-30 with alveoloplasty;  Surgeon: [name], DDS;  Location: [facility];  Service: Oral Surgery;  Laterality: N/A;  GENERAL WITH NASAL TUBE   VIDEO ASSISTED THORACOSCOPY (VATS)/WEDGE RESECTION Right 10/21/2012   Procedure: VIDEO ASSISTED THORACOSCOPY (VATS)/WEDGE RESECTION;  Surgeon: [name], MD;  Location: [facility];  Service: Thoracic;  Laterality: Right;   VIDEO BRONCHOSCOPY N/A 10/21/2012   Procedure: VIDEO BRONCHOSCOPY;  Surgeon: [name], MD;  Location: [facility] OR;  Service: Thoracic;  Laterality: N/A;    SOCIAL HISTORY: Social History   Socioeconomic History   Marital status: Divorced    Spouse name: Not on file   Number of children: 1   Years of education: 12+   Highest education level: Not on file  Occupational History   Occupation: disabled  Tobacco Use   Smoking status: Every Day    Packs/day: 0.50    Years: 50.00    Total pack years: 25.00    Types: Cigarettes   Smokeless tobacco: Never   Tobacco comments:    Quit a few times  Vaping Use   Vaping Use: Former   Quit date: 09/15/2016  Substance and Sexual Activity   Alcohol use: Yes    Alcohol/week: 1.0 standard drink of alcohol    Types: 1 Standard drinks or equivalent per week    Comment: Not weekly   Drug use: Never   Sexual activity: Not Currently    Birth control/protection: Surgical  Other Topics Concern   Not on file  Social History Narrative   Son-lives w/son.   disabled   Social Determinants of Health   Financial Resource Strain: Low Risk  (12/26/2021)   Overall Financial Resource Strain (CARDIA)    Difficulty of Paying Living Expenses: Not hard at all  Food Insecurity: No Food Insecurity (12/26/2021)   Hunger Vital Sign    Worried About Running Out of Food in the Last Year:  Never true    Ran Out of Food in the Last Year: Never true  Transportation Needs: No Transportation Needs (12/26/2021)   PRAPARE - Hydrologist (Medical): No    Lack of Transportation (Non-Medical): No  Physical Activity: Inactive (12/26/2021)   Exercise Vital Sign    Days of Exercise per Week: 0 days    Minutes of Exercise per Session: 0 min  Stress: Stress Concern Present (12/26/2021)   [place]  Stress Questionnaire    Feeling of Stress : Rather much  Social Connections: Socially Isolated (12/26/2021)   Social Connection and Isolation Panel [NHANES]    Frequency of Communication with Friends and Family: Once a week    Frequency of Social Gatherings with Friends and Family: Once a week    Attends Religious Services: Never    Marine scientist or Organizations: No    Attends Archivist Meetings: Never    Marital Status: Divorced  Human resources officer Violence: Not At Risk (12/26/2021)   Humiliation, Afraid, Rape, and Kick questionnaire    Fear of Current or Ex-Partner: No    Emotionally Abused: No    Physically Abused: No    Sexually Abused: No    FAMILY HISTORY: Family History  Problem Relation Age of Onset   Arthritis Mother    Hyperlipidemia Mother    Depression Mother    Anxiety disorder Mother    Dementia Mother    Hypertension Father    Hyperlipidemia Father    Heart disease Father    Stroke Father    Dementia Father    Heart disease Brother    ADD / ADHD Son    Alcohol abuse Maternal Grandfather    Bipolar disorder Neg Hx    Drug abuse Neg Hx    OCD Neg Hx    Paranoid behavior Neg Hx    Schizophrenia Neg Hx    Seizures Neg Hx    Sexual abuse Neg Hx    Physical abuse Neg Hx     ALLERGIES:  is allergic to contrast media [iodinated contrast media], erythromycin, flagyl [metronidazole], latex, other, penicillins, and tetracyclines & related.  MEDICATIONS:  Current Outpatient  Medications  Medication Sig Dispense Refill   amLODipine (NORVASC) 5 MG tablet TAKE ONE TABLET BY MOUTH EVERYDAY AT BEDTIME 90 tablet 1   aspirin EC 81 MG tablet Take 1 tablet (81 mg total) by mouth daily. 90 tablet 1   atorvastatin (LIPITOR) 80 MG tablet TAKE ONE TABLET BY MOUTH EVERYDAY AT BEDTIME 90 tablet 1   baclofen (LIORESAL) 20 MG tablet Take 20 mg by mouth 2 (two) times daily as needed.     Blood Glucose Monitoring Suppl (BLOOD GLUCOSE SYSTEM PAK) KIT Please dispense based on patient and insurance preference. Use as directed to monitor FSBS 2x daily. Dx: E11.9 1 kit 1   buPROPion ER (WELLBUTRIN SR) 100 MG 12 hr tablet TAKE ONE TABLET BY MOUTH EVERY MORNING and TAKE ONE TABLET BY MOUTH EVERYDAY AT BEDTIME 180 tablet 1   DULoxetine (CYMBALTA) 60 MG capsule 1 tab po bid 180 capsule 1   fenofibrate (TRICOR) 145 MG tablet Take 1 tablet (145 mg total) by mouth daily. 90 tablet 1   fish oil-omega-3 fatty acids 1000 MG capsule Take 2 capsules (2 g total) by mouth 2 (two) times daily. 180 capsule 1   gabapentin (NEURONTIN) 100 MG capsule take 1-3 capsules BY MOUTH three times daily AS NEEDED FOR nerve pain 270 capsule 1   glucose blood (ACCU-CHEK GUIDE) test strip Use as instructed 100 each 12   Glucose Blood (BLOOD GLUCOSE TEST STRIPS) STRP Please dispense based on patient and insurance preference. Use as directed to monitor FSBS 2x daily. Dx: E11.9 100 strip 1   hydrochlorothiazide (HYDRODIURIL) 25 MG tablet Take 1 tablet (25 mg total) by mouth every morning. 90 tablet 1   hydroxyurea (HYDREA) 500 MG capsule TAKE TWO CAPSULES BY MOUTH EVERYDAY AT BEDTIME 90 capsule 3  lisinopril (ZESTRIL) 5 MG tablet TAKE ONE TABLET BY MOUTH EVERYDAY AT BEDTIME 90 tablet 1   metFORMIN (GLUCOPHAGE) 1000 MG tablet TAKE ONE TABLET BY MOUTH EVERY MORNING and TAKE ONE TABLET BY MOUTH EVERYDAY AT BEDTIME 180 tablet 1   Microlet Lancets MISC Please dispense based on patient and insurance preference. Use as directed to  monitor FSBS 2x daily. Dx: E11.9 100 each 11   nystatin (MYCOSTATIN/NYSTOP) powder Apply 1 Application topically 3 (three) times daily. PA Case: [id number] Approved 06/30/2019- 06/28/2020 60 g 2   nystatin cream (MYCOSTATIN) Apply 1 Application topically 2 (two) times daily. To affected areas 60 g 2   oxyCODONE-acetaminophen (PERCOCET) 10-325 MG tablet Take 1 tablet by mouth 4 (four) times daily as needed for severe pain.     senna (SENOKOT) 8.6 MG TABS tablet Take 2 tablets by mouth daily as needed for mild constipation.     Vitamin D, Ergocalciferol, (DRISDOL) 1.25 MG (50000 UNIT) CAPS capsule TAKE ONE TABLET BY MOUTH on sundays at bedtime 12 capsule 1   No current facility-administered medications for this visit.    REVIEW OF SYSTEMS:   10 Point review of Systems was done is negative except as noted above.  PHYSICAL EXAMINATION: ECOG PERFORMANCE STATUS:3  Vitals:   05/19/22 0847  BP: (!) 151/68  Pulse: 74  Resp: 16  Temp: 97.9 F (36.6 C)  SpO2: 96%    Filed Weights   05/19/22 0847  Weight: 214 lb 14.4 oz (97.5 kg)    .Body mass index is 32.68 kg/m.  Marland Kitchen GENERAL:alert, in no acute distress and comfortable SKIN: no acute rashes, no significant lesions EYES: conjunctiva are pink and non-injected, sclera anicteric OROPHARYNX: MMM, no exudates, no oropharyngeal erythema or ulceration NECK: supple, no JVD LYMPH:  no palpable lymphadenopathy in the cervical, axillary or inguinal regions LUNGS: clear to auscultation b/l with normal respiratory effort HEART: regular rate & rhythm ABDOMEN:  normoactive bowel sounds , non tender, not distended. Extremity: no pedal edema PSYCH: alert & oriented x 3 with fluent speech NEURO: no focal motor/sensory deficits   LABORATORY DATA:  I have reviewed the data as listed  .    Latest Ref Rng & Units 05/19/2022    8:39 AM 10/29/2021    9:34 AM 09/17/2021    9:03 AM  CBC  WBC 4.0 - 10.5 K/uL 12.8  20.6  21.2   Hemoglobin 12.0 - 15.0 g/dL  14.3  13.2  11.8   Hematocrit 36.0 - 46.0 % 47.6  45.5  39.7   Platelets 150 - 400 K/uL 261  628  762    . CBC    Component Value Date/Time   WBC 12.8 (H) 05/19/2022 0839   WBC 20.6 (H) 10/29/2021 0934   RBC 5.51 (H) 05/19/2022 0839   HGB 14.3 05/19/2022 0839   HCT 47.6 (H) 05/19/2022 0839   PLT 261 05/19/2022 0839   MCV 86.4 05/19/2022 0839   MCH 26.0 05/19/2022 0839   MCHC 30.0 05/19/2022 0839   RDW 19.0 (H) 05/19/2022 0839   LYMPHSABS 1.3 05/19/2022 0839   MONOABS 0.4 05/19/2022 0839   EOSABS 0.3 05/19/2022 0839   BASOSABS 0.2 (H) 05/19/2022 0839     .    Latest Ref Rng & Units 05/19/2022    8:39 AM 05/18/2022    9:59 AM 10/29/2021    9:34 AM  CMP  Glucose 70 - 99 mg/dL 135  148  200   BUN 8 - 23 mg/dL 18  21  20   Creatinine 0.44 - 1.00 mg/dL 0.49  0.59  0.83   Sodium 135 - 145 mmol/L 138  140  140   Potassium 3.5 - 5.1 mmol/L 4.2  4.3  4.4   Chloride 98 - 111 mmol/L 104  103  103   CO2 22 - 32 mmol/L _0 Calcium 8.9 - 10.3 mg/dL 9.6  8.8  9.1   Total Protein 6.5 - 8.1 g/dL 7.2  6.8  7.8   Total Bilirubin 0.3 - 1.2 mg/dL 0.4  0.4  0.5   Alkaline Phos 38 - 126 U/L 90  89  127   AST 15 - 41 U/L _1 ALT 0 - 44 U/L _2 08/15/12 JAK2 Mutation:     03/15/18 BM Bx:      RADIOGRAPHIC STUDIES: I have personally reviewed the radiological images as listed and agreed with the findings in the report. No results found.  ASSESSMENT & PLAN:   60 y.o. female with  1. Jak2 V617F mutation positive Myeloproliferative syndrome.- BM Bx consistent with Polycythemia Vera  JAK2 mutation identified in 08/15/12 labs  03/15/18 BM Bx revealed results consistent with JAK2 positive polycythemia vera  Labs done today show hemoglobin of 11.8, WBC count of 21.2k and platelets of 1k  2. H/o Medication/followup Non compliance  3. COPD - O2 dependant  4. History of Metastatic melanoma to the right lung  S/p resection by Dr. Servando Snare on 10/21/12, BRAF  mutation not detected. -continue q53monthy skin screening her PCP/dermatologist -symptom directed radiologic scanning.   5.  Past Medical History:  Diagnosis Date   Agoraphobia    Anxiety    Arthritis    "all joints"   Chronic low back pain    Chronic respiratory failure with hypoxia ([name])    4L  via [place],  followed by pcp,   (09-16-2018  per pt only uses while at home and at night ,  portable oxygen is not working)   [place]   Polycythemia vera   Colon polyps    COPD [place] obstructive [place] disease) [place]    DDD (degenerative disc disease), lumbosacral    Dental caries    DM type 2 (diabetes mellitus, type 2) [place]    followed by pcp   Fibromyalgia    GERD (gastroesophageal reflux disease)    occasional , no meds   Heart murmur 2020   History of basal cell carcinoma (BCC) excision    s/p  Moh's of face/ nose 12/ 2013   History of palpitations    per pt found to have occaional PVCs   History of UTI    HTN (hypertension)    Hyperlipidemia    Loose, teeth    Major depression    [place] 10/18[place]2012   12 mm posterior right upper lobe pulmonary nodule, max SUV 3.0  s/p  right wedge resection 10-21-2012,  followed by oncologist-- dr kIrene Limbo  no recurrence   Mixed stress and urge urinary incontinence    Myeloproliferative neoplasm ([place]    Narcolepsy    Obesity    OSA (obstructive sleep apnea)    Oxygen deficiency    Oxygen dependent    4 L via [place],  per pt portable oxygen not working but does use while at home and at night   Panic attacks    Periodontitis    Peripheral neuropathy  feet   Polycythemia vera(238.4) hematology/ oncology-- dr Irene Limbo ([facility])   first dx 02014 due to smoker/copd--  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)  hx phlebotomies   Pulmonary nodules    bilateral small stable per CT 03-11-2018   Scoliosis    Sleep apnea    Smokers' cough ([place])    per pt productive a little in the  morning's   Symptoms of upper respiratory infection (URI) 03/12/2018   Urinary (tract) obstruction    Wears glasses    PLAN: -Lab results from labs today were discussed in detail with the patient. Labs shows WBC counts of 12.8 K, hemoglobin of 14.3 K, hematocrits of 47.6, and platelets of 261.  -She has no reported toxicities from her current dose of hydroxyurea at 1000 mg p.o. daily. -Patient increase her hydroxyurea dose to 1500 mg 2 days a week and 1000 mg p.o. daily the remaining 5 days of the week. -Recommended smoking cessation.  -Educated patient on the risks of smoking cigarettes. -Discussed options to help with depression.   -therapeutic phlebotomy prn at HCT reaching 50 despite adequeste hydration.   FOLLOW UP: -Return to clinic with [name] with labs and appointment for therapeutic phlebotomy in 2 months   The total time spent in the appointment was 21 minutes* .  All of the patient's questions were answered with apparent satisfaction. The patient knows to call the clinic with any problems, questions or concerns.   Sullivan Lone MD MS AAHIVMS [facility] Hematology/Oncology Physician [facility]  .*Total Encounter Time as defined by the Centers for Medicare and Medicaid Services includes, in addition to the face-to-face time of a patient visit (documented in the note above) non-face-to-face time: obtaining and reviewing outside history, ordering and reviewing medications, tests or procedures, care coordination (communications with other health care professionals or caregivers) and documentation in the medical record.   I, Cleda Mccreedy, am acting as a Education administrator for Sullivan Lone, MD.  .I have reviewed the above documentation for accuracy and completeness, and I agree with the above. [name] MD

## 2022-05-19 NOTE — Progress Notes (Signed)
Labs are at [facility] same meds

## 2022-05-25 ENCOUNTER — Other Ambulatory Visit: Payer: Self-pay | Admitting: *Deleted

## 2022-05-25 DIAGNOSIS — F17200 Nicotine dependence, unspecified, uncomplicated: Secondary | ICD-10-CM

## 2022-05-26 ENCOUNTER — Encounter: Payer: Self-pay | Admitting: Hematology

## 2022-05-28 ENCOUNTER — Other Ambulatory Visit: Payer: Self-pay | Admitting: Hematology

## 2022-05-28 DIAGNOSIS — D471 Chronic myeloproliferative disease: Secondary | ICD-10-CM

## 2022-05-28 DIAGNOSIS — D45 Polycythemia vera: Secondary | ICD-10-CM

## 2022-06-16 ENCOUNTER — Other Ambulatory Visit: Payer: Medicare HMO

## 2022-07-20 ENCOUNTER — Ambulatory Visit
Admission: RE | Admit: 2022-07-20 | Discharge: 2022-07-20 | Disposition: A | Discharge status: Home / Self Care | Payer: Medicare HMO | Source: Ambulatory Visit | Attending: Family Medicine | Admitting: Family Medicine

## 2022-07-20 ENCOUNTER — Telehealth: Payer: Self-pay

## 2022-07-20 DIAGNOSIS — F17200 Nicotine dependence, unspecified, uncomplicated: Secondary | ICD-10-CM

## 2022-07-20 NOTE — Telephone Encounter (Signed)
Erskine Squibb from [facility] called regarding the report of the CT chest that pt had done today and state the last 2 points at the end of the report they wanted to bring to the doctors attention.

## 2022-07-21 ENCOUNTER — Telehealth: Payer: Self-pay | Admitting: Family Medicine

## 2022-07-21 ENCOUNTER — Other Ambulatory Visit: Payer: Self-pay | Admitting: Family Medicine

## 2022-07-21 DIAGNOSIS — R911 Solitary pulmonary nodule: Secondary | ICD-10-CM

## 2022-07-21 DIAGNOSIS — Z85820 Personal history of malignant melanoma of skin: Secondary | ICD-10-CM

## 2022-07-21 NOTE — Progress Notes (Signed)
D/w pt.  Opts to reck CT in 3 months.  ordered

## 2022-07-21 NOTE — Progress Notes (Signed)
D/w pt lung CT-she opts to reck in 3 mo as rec by rad-ordered

## 2022-07-21 NOTE — Telephone Encounter (Signed)
Please see message below

## 2022-07-21 NOTE — Telephone Encounter (Signed)
Patient states PCP attempted to call her an hour ago. States in message PCP said she would attempt another call later. Pt states she wanted PCP to know that she is available for the rest of the day.

## 2022-08-07 ENCOUNTER — Encounter: Payer: Self-pay | Admitting: Hematology

## 2022-08-17 ENCOUNTER — Other Ambulatory Visit: Payer: Self-pay

## 2022-08-17 DIAGNOSIS — D45 Polycythemia vera: Secondary | ICD-10-CM

## 2022-08-18 ENCOUNTER — Inpatient Hospital Stay (HOSPITAL_BASED_OUTPATIENT_CLINIC_OR_DEPARTMENT_OTHER): Payer: Medicare HMO

## 2022-08-18 ENCOUNTER — Inpatient Hospital Stay: Payer: Medicare HMO

## 2022-08-18 ENCOUNTER — Other Ambulatory Visit: Payer: Self-pay

## 2022-08-18 ENCOUNTER — Telehealth: Payer: Self-pay | Admitting: Family Medicine

## 2022-08-18 ENCOUNTER — Inpatient Hospital Stay: Payer: Medicare HMO | Attending: Hematology | Admitting: Hematology

## 2022-08-18 VITALS — BP 135/60 | HR 77 | Temp 97.8°F | Resp 18 | Ht 68.0 in | Wt 215.7 lb

## 2022-08-18 DIAGNOSIS — D45 Polycythemia vera: Secondary | ICD-10-CM

## 2022-08-18 DIAGNOSIS — Z85820 Personal history of malignant melanoma of skin: Secondary | ICD-10-CM

## 2022-08-18 DIAGNOSIS — R030 Elevated blood-pressure reading, without diagnosis of hypertension: Secondary | ICD-10-CM | POA: Diagnosis not present

## 2022-08-18 DIAGNOSIS — M5030 Other cervical disc degeneration, unspecified cervical region: Secondary | ICD-10-CM | POA: Diagnosis not present

## 2022-08-18 DIAGNOSIS — F17200 Nicotine dependence, unspecified, uncomplicated: Secondary | ICD-10-CM | POA: Diagnosis not present

## 2022-08-18 DIAGNOSIS — D471 Chronic myeloproliferative disease: Secondary | ICD-10-CM | POA: Diagnosis not present

## 2022-08-18 DIAGNOSIS — Z79899 Other long term (current) drug therapy: Secondary | ICD-10-CM | POA: Insufficient documentation

## 2022-08-18 DIAGNOSIS — G894 Chronic pain syndrome: Secondary | ICD-10-CM | POA: Diagnosis not present

## 2022-08-18 DIAGNOSIS — R079 Chest pain, unspecified: Secondary | ICD-10-CM | POA: Diagnosis not present

## 2022-08-18 DIAGNOSIS — M5136 Other intervertebral disc degeneration, lumbar region: Secondary | ICD-10-CM | POA: Diagnosis not present

## 2022-08-18 DIAGNOSIS — Z6832 Body mass index (BMI) 32.0-32.9, adult: Secondary | ICD-10-CM | POA: Diagnosis not present

## 2022-08-18 DIAGNOSIS — F17210 Nicotine dependence, cigarettes, uncomplicated: Secondary | ICD-10-CM | POA: Insufficient documentation

## 2022-08-18 DIAGNOSIS — R918 Other nonspecific abnormal finding of lung field: Secondary | ICD-10-CM | POA: Diagnosis not present

## 2022-08-18 DIAGNOSIS — E119 Type 2 diabetes mellitus without complications: Secondary | ICD-10-CM | POA: Diagnosis not present

## 2022-08-18 LAB — CBC WITH DIFFERENTIAL (CANCER CENTER ONLY)
Abs Immature Granulocytes: 0.09 10*3/uL — ABNORMAL HIGH (ref 0.00–0.07)
Basophils Absolute: 0.2 10*3/uL — ABNORMAL HIGH (ref 0.0–0.1)
Basophils Relative: 1 %
Eosinophils Absolute: 0.3 10*3/uL (ref 0.0–0.5)
Eosinophils Relative: 2 %
HCT: 49.7 % — ABNORMAL HIGH (ref 36.0–46.0)
Hemoglobin: 15.1 g/dL — ABNORMAL HIGH (ref 12.0–15.0)
Immature Granulocytes: 1 %
Lymphocytes Relative: 12 %
Lymphs Abs: 1.9 10*3/uL (ref 0.7–4.0)
MCH: 26.5 pg (ref 26.0–34.0)
MCHC: 30.4 g/dL (ref 30.0–36.0)
MCV: 87.3 fL (ref 80.0–100.0)
Monocytes Absolute: 0.5 10*3/uL (ref 0.1–1.0)
Monocytes Relative: 3 %
Neutro Abs: 12.8 10*3/uL — ABNORMAL HIGH (ref 1.7–7.7)
Neutrophils Relative %: 81 %
Platelet Count: 404 10*3/uL — ABNORMAL HIGH (ref 150–400)
RBC: 5.69 MIL/uL — ABNORMAL HIGH (ref 3.87–5.11)
RDW: 19.2 % — ABNORMAL HIGH (ref 11.5–15.5)
WBC Count: 15.7 10*3/uL — ABNORMAL HIGH (ref 4.0–10.5)
nRBC: 0 % (ref 0.0–0.2)

## 2022-08-18 LAB — CMP (CANCER CENTER ONLY)
ALT: 12 U/L (ref 0–44)
AST: 14 U/L — ABNORMAL LOW (ref 15–41)
Albumin: 4.4 g/dL (ref 3.5–5.0)
Alkaline Phosphatase: 110 U/L (ref 38–126)
Anion gap: 6 (ref 5–15)
BUN: 30 mg/dL — ABNORMAL HIGH (ref 8–23)
CO2: 31 mmol/L (ref 22–32)
Calcium: 10 mg/dL (ref 8.9–10.3)
Chloride: 99 mmol/L (ref 98–111)
Creatinine: 0.9 mg/dL (ref 0.44–1.00)
GFR, Estimated: >60 mL/min (ref >60)
Glucose, Bld: 115 mg/dL — ABNORMAL HIGH (ref 70–99)
Potassium: 4.5 mmol/L (ref 3.5–5.1)
Sodium: 136 mmol/L (ref 135–145)
Total Bilirubin: 0.3 mg/dL (ref 0.3–1.2)
Total Protein: 7.5 g/dL (ref 6.5–8.1)

## 2022-08-18 LAB — IRON AND IRON BINDING CAPACITY (CC-WL,HP ONLY)
Iron: 33 ug/dL (ref 28–170)
Saturation Ratios: 5 % — ABNORMAL LOW (ref 10.4–31.8)
TIBC: 658 ug/dL — ABNORMAL HIGH (ref 250–450)
UIBC: 625 ug/dL — ABNORMAL HIGH (ref 148–442)

## 2022-08-18 LAB — FERRITIN: Ferritin: 11 ng/mL (ref 11–307)

## 2022-08-18 NOTE — Progress Notes (Signed)
HEMATOLOGY/ONCOLOGY CLINIC NOTE  Date of Service: 08/18/22   Patient Care Team: [name], MD as PCP - General (Family Medicine) [name], MD as Consulting Physician (Hematology) [name], [facility] as Pharmacist (Pharmacist)  CHIEF COMPLAINTS/PURPOSE OF CONSULTATION:  Evaluation and management of polycythemia vera  HISTORY OF PRESENTING ILLNESS:  Please see previous note for details of initial presentation  INTERVAL HISTORY:   Katelyn Cline is here for continued evaluation and management of her polycythemia vera.   Patient was last seen by me on 05/19/2022 and she was doing well overall. She was still smoking around one pack a day during the last visit.   Patient reports she has been doing fairly well since our last visit. She complains of feeling cold and sleeping more than usual. She notes she has been eating well and drinking well. Patient notes her weight is fluctuating from December. She is still on 3 Liters of Oxygen.   She has been regularly taking Hydroxyurea as prescribed and has been tolerating it well without any new toxicities.   She is complaint with all of hr medication except she has recently started taking a new muscle relaxer for pain management.   Patient notes she is still smoking cigarettes, but has cut down since our last visit. She smokes around around half a pack a day.    She notes she recently had CT Chest Lung which showed a lung nodule that has increased in size to 8 mm. She notes that her PCP is managing this.   Patient complains of abnormal bowel movement. She notes that she sometimes does not know when she has to use the bathroom. She also reports occasional rectal pain. Patient notes she has been following up with her Gastroenterologist for this symptoms. She notes she has an upcoming CT abdominal scheduled and is waiting to schedule colonoscopy.    She denies fever, night sweats, infection issues, new lump/bumps,  abdominal pain, chest pain, back pain, or leg swelling. She does complains of chills and hot flashes.   Patient notes she was diagnosed with lung melanoma in 2018.   MEDICAL HISTORY:  Past Medical History:  Diagnosis Date   Agoraphobia    Anxiety    Arthritis    "all joints"   Chronic low back pain    Chronic respiratory failure with hypoxia ([name])    4L  via [place],  followed by pcp,   (09-16-2018  per pt only uses while at home and at night ,  portable oxygen is not working)   Clotting disorder ([place]) 2013   Polycythemia vera   Colon polyps    COPD (chronic obstructive pulmonary disease) ([place])    DDD (degenerative disc disease), lumbosacral    Dental caries    DM type 2 (diabetes mellitus, type 2) ([place])    followed by pcp   Fibromyalgia    GERD (gastroesophageal reflux disease)    occasional , no meds   Heart murmur 2020   History of basal cell carcinoma (BCC) excision    s/p  Moh's of face/ nose 12/ 2013   History of palpitations    per pt found to have occaional PVCs   History of UTI    HTN (hypertension)    Hyperlipidemia    Loose, teeth    Major depression    Metastatic melanoma ([place]) 10/18/2012   12 mm posterior right upper lobe pulmonary nodule, max SUV 3.0  s/p  right wedge resection 10-21-2012,  followed by oncologist-- dr Irene Limbo,  no recurrence   Mixed stress and urge urinary incontinence    Myeloproliferative neoplasm ([place])    Narcolepsy    Obesity    OSA (obstructive sleep apnea)    Oxygen deficiency    Oxygen dependent    4 L via [place],  per pt portable oxygen not working but does use while at home and at night   Panic attacks    Periodontitis    Peripheral neuropathy    feet   Polycythemia vera(238.4) hematology/ oncology-- dr Irene Limbo ([facility])   first dx 02014 due to smoker/copd--  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)  hx phlebotomies   Pulmonary nodules    bilateral small stable per CT 03-11-2018   Scoliosis    Sleep apnea     Smokers' cough ([place])    per pt productive a little in the morning's   Symptoms of upper respiratory infection (URI) 03/12/2018   Urinary (tract) obstruction    Wears glasses     SURGICAL HISTORY: Past Surgical History:  Procedure Laterality Date   ABDOMINAL HYSTERECTOMY  1998   partial   BREAST LUMPECTOMY Bilateral 1995   benign per pt   COLONOSCOPY W/ BIOPSIES AND POLYPECTOMY     Hx: of   CYSTECTOMY  2000   abdominal wall    DILATION AND CURETTAGE OF UTERUS  yrs ago   MOHS SURGERY  2013   nose/face   MULTIPLE EXTRACTIONS WITH ALVEOLOPLASTY N/A 09/19/2018   Procedure: Extraction of tooth #'s 2, 3, 6-14, 18, and 20-30 with alveoloplasty;  Surgeon: [name], DDS;  Location: [facility];  Service: Oral Surgery;  Laterality: N/A;  GENERAL WITH NASAL TUBE   VIDEO ASSISTED THORACOSCOPY (VATS)/WEDGE RESECTION Right 10/21/2012   Procedure: VIDEO ASSISTED THORACOSCOPY (VATS)/WEDGE RESECTION;  Surgeon: [name], MD;  Location: [facility];  Service: Thoracic;  Laterality: Right;   VIDEO BRONCHOSCOPY N/A 10/21/2012   Procedure: VIDEO BRONCHOSCOPY;  Surgeon: [name], MD;  Location: [facility] OR;  Service: Thoracic;  Laterality: N/A;    SOCIAL HISTORY: Social History   Socioeconomic History   Marital status: Divorced    Spouse name: Not on file   Number of children: 1   Years of education: 12+   Highest education level: Not on file  Occupational History   Occupation: disabled  Tobacco Use   Smoking status: Every Day    Packs/day: 0.50    Years: 50.00    Total pack years: 25.00    Types: Cigarettes   Smokeless tobacco: Never   Tobacco comments:    Quit a few times  Vaping Use   Vaping Use: Former   Quit date: 09/15/2016  Substance and Sexual Activity   Alcohol use: Yes    Alcohol/week: 1.0 standard drink of alcohol    Types: 1 Standard drinks or equivalent per week    Comment: Not weekly   Drug use: Never   Sexual activity: Not Currently    Birth  control/protection: Surgical  Other Topics Concern   Not on file  Social History Narrative   Son-lives w/son.   disabled   Social Determinants of Health   Financial Resource Strain: Low Risk  (12/26/2021)   Overall Financial Resource Strain (CARDIA)    Difficulty of Paying Living Expenses: Not hard at all  Food Insecurity: No Food Insecurity (12/26/2021)   Hunger Vital Sign    Worried About Running Out of Food in the Last Year: Never true  Ran Out of Food in the Last Year: Never true  Transportation Needs: No Transportation Needs (12/26/2021)   PRAPARE - Hydrologist (Medical): No    Lack of Transportation (Non-Medical): No  Physical Activity: Inactive (12/26/2021)   Exercise Vital Sign    Days of Exercise per Week: 0 days    Minutes of Exercise per Session: 0 min  Stress: Stress Concern Present (12/26/2021)   [place]    Feeling of Stress : Rather much  Social Connections: Socially Isolated (12/26/2021)   Social Connection and Isolation Panel [NHANES]    Frequency of Communication with Friends and Family: Once a week    Frequency of Social Gatherings with Friends and Family: Once a week    Attends Religious Services: Never    Marine scientist or Organizations: No    Attends Archivist Meetings: Never    Marital Status: Divorced  Human resources officer Violence: Not At Risk (12/26/2021)   Humiliation, Afraid, Rape, and Kick questionnaire    Fear of Current or Ex-Partner: No    Emotionally Abused: No    Physically Abused: No    Sexually Abused: No    FAMILY HISTORY: Family History  Problem Relation Age of Onset   Arthritis Mother    Hyperlipidemia Mother    Depression Mother    Anxiety disorder Mother    Dementia Mother    Hypertension Father    Hyperlipidemia Father    Heart disease Father    Stroke Father    Dementia Father    Heart disease Brother    ADD /  ADHD Son    Alcohol abuse Maternal Grandfather    Bipolar disorder Neg Hx    Drug abuse Neg Hx    OCD Neg Hx    Paranoid behavior Neg Hx    Schizophrenia Neg Hx    Seizures Neg Hx    Sexual abuse Neg Hx    Physical abuse Neg Hx     ALLERGIES:  is allergic to contrast media [iodinated contrast media], erythromycin, flagyl [metronidazole], latex, other, penicillins, and tetracyclines & related.  MEDICATIONS:  Current Outpatient Medications  Medication Sig Dispense Refill   amLODipine (NORVASC) 5 MG tablet TAKE ONE TABLET BY MOUTH EVERYDAY AT BEDTIME 90 tablet 1   aspirin EC 81 MG tablet Take 1 tablet (81 mg total) by mouth daily. 90 tablet 1   atorvastatin (LIPITOR) 80 MG tablet TAKE ONE TABLET BY MOUTH EVERYDAY AT BEDTIME 90 tablet 1   baclofen (LIORESAL) 20 MG tablet Take 20 mg by mouth 2 (two) times daily as needed.     Blood Glucose Monitoring Suppl (BLOOD GLUCOSE SYSTEM PAK) KIT Please dispense based on patient and insurance preference. Use as directed to monitor FSBS 2x daily. Dx: E11.9 1 kit 1   buPROPion ER (WELLBUTRIN SR) 100 MG 12 hr tablet TAKE ONE TABLET BY MOUTH EVERY MORNING and TAKE ONE TABLET BY MOUTH EVERYDAY AT BEDTIME 180 tablet 1   DULoxetine (CYMBALTA) 60 MG capsule 1 tab po bid 180 capsule 1   fenofibrate (TRICOR) 145 MG tablet Take 1 tablet (145 mg total) by mouth daily. 90 tablet 1   fish oil-omega-3 fatty acids 1000 MG capsule Take 2 capsules (2 g total) by mouth 2 (two) times daily. 180 capsule 1   gabapentin (NEURONTIN) 100 MG capsule take 1-3 capsules BY MOUTH three times daily AS NEEDED FOR nerve pain 270 capsule 1  glucose blood (ACCU-CHEK GUIDE) test strip Use as instructed 100 each 12   Glucose Blood (BLOOD GLUCOSE TEST STRIPS) STRP Please dispense based on patient and insurance preference. Use as directed to monitor FSBS 2x daily. Dx: E11.9 100 strip 1   hydrochlorothiazide (HYDRODIURIL) 25 MG tablet Take 1 tablet (25 mg total) by mouth every morning. 90  tablet 1   hydroxyurea (HYDREA) 500 MG capsule TAKE TWO CAPSULES BY MOUTH EVERYDAY AT BEDTIME 90 capsule 3   lisinopril (ZESTRIL) 5 MG tablet TAKE ONE TABLET BY MOUTH EVERYDAY AT BEDTIME 90 tablet 1   metFORMIN (GLUCOPHAGE) 1000 MG tablet TAKE ONE TABLET BY MOUTH EVERY MORNING and TAKE ONE TABLET BY MOUTH EVERYDAY AT BEDTIME 180 tablet 1   Microlet Lancets MISC Please dispense based on patient and insurance preference. Use as directed to monitor FSBS 2x daily. Dx: E11.9 100 each 11   nystatin (MYCOSTATIN/NYSTOP) powder Apply 1 Application topically 3 (three) times daily. PA Case: [id number] Approved 06/30/2019- 06/28/2020 60 g 2   nystatin cream (MYCOSTATIN) Apply 1 Application topically 2 (two) times daily. To affected areas 60 g 2   oxyCODONE-acetaminophen (PERCOCET) 10-325 MG tablet Take 1 tablet by mouth 4 (four) times daily as needed for severe pain.     senna (SENOKOT) 8.6 MG TABS tablet Take 2 tablets by mouth daily as needed for mild constipation.     Vitamin D, Ergocalciferol, (DRISDOL) 1.25 MG (50000 UNIT) CAPS capsule TAKE ONE TABLET BY MOUTH on sundays at bedtime 12 capsule 1   No current facility-administered medications for this visit.    REVIEW OF SYSTEMS:   10 Point review of Systems was done is negative except as noted above.  PHYSICAL EXAMINATION: ECOG PERFORMANCE STATUS:3  Vitals:   08/18/22 0844  BP: 135/60  Pulse: 77  Resp: 18  Temp: 97.8 F (36.6 C)  SpO2: 99%    Filed Weights   08/18/22 0844  Weight: 215 lb 11.2 oz (97.8 kg)    .Body mass index is 32.8 kg/m.  Marland Kitchen GENERAL:alert, in no acute distress and comfortable SKIN: no acute rashes, no significant lesions EYES: conjunctiva are pink and non-injected, sclera anicteric OROPHARYNX: MMM, no exudates, no oropharyngeal erythema or ulceration NECK: supple, no JVD LYMPH:  no palpable lymphadenopathy in the cervical, axillary or inguinal regions LUNGS: clear to auscultation b/l with normal respiratory  effort HEART: regular rate & rhythm ABDOMEN:  normoactive bowel sounds , non tender, not distended. Extremity: no pedal edema PSYCH: alert & oriented x 3 with fluent speech NEURO: no focal motor/sensory deficits   LABORATORY DATA:  I have reviewed the data as listed  .    Latest Ref Rng & Units 08/18/2022    8:18 AM 05/19/2022    8:39 AM 10/29/2021    9:34 AM  CBC  WBC 4.0 - 10.5 K/uL 15.7  12.8  20.6   Hemoglobin 12.0 - 15.0 g/dL 15.1  14.3  13.2   Hematocrit 36.0 - 46.0 % 49.7  47.6  45.5   Platelets 150 - 400 K/uL 404  261  628    . CBC    Component Value Date/Time   WBC 15.7 (H) 08/18/2022 0818   WBC 20.6 (H) 10/29/2021 0934   RBC 5.69 (H) 08/18/2022 0818   HGB 15.1 (H) 08/18/2022 0818   HCT 49.7 (H) 08/18/2022 0818   PLT 404 (H) 08/18/2022 0818   MCV 87.3 08/18/2022 0818   MCH 26.5 08/18/2022 0818   MCHC 30.4 08/18/2022 0818  RDW 19.2 (H) 08/18/2022 0818   LYMPHSABS 1.9 08/18/2022 0818   MONOABS 0.5 08/18/2022 0818   EOSABS 0.3 08/18/2022 0818   BASOSABS 0.2 (H) 08/18/2022 0818     .    Latest Ref Rng & Units 08/18/2022    8:18 AM 05/19/2022    8:39 AM 05/18/2022    9:59 AM  CMP  Glucose 70 - 99 mg/dL 115  135  148   BUN 8 - 23 mg/dL '30  18  21   '$ Creatinine 0.44 - 1.00 mg/dL 0.90  0.49  0.59   Sodium 135 - 145 mmol/L 136  138  140   Potassium 3.5 - 5.1 mmol/L 4.5  4.2  4.3   Chloride 98 - 111 mmol/L 99  104  103   CO2 22 - 32 mmol/L '31  27  28   '$ Calcium 8.9 - 10.3 mg/dL 10.0  9.6  8.8   Total Protein 6.5 - 8.1 g/dL 7.5  7.2  6.8   Total Bilirubin 0.3 - 1.2 mg/dL 0.3  0.4  0.4   Alkaline Phos 38 - 126 U/L 110  90  89   AST 15 - 41 U/L '14  12  14   '$ ALT 0 - 44 U/L '12  11  9    '$ 08/15/12 JAK2 Mutation:     03/15/18 BM Bx:      RADIOGRAPHIC STUDIES: I have personally reviewed the radiological images as listed and agreed with the findings in the report. CT CHEST LUNG CA SCREEN LOW DOSE W/O CM  Result Date: 07/20/2022 CLINICAL DATA:  62 year old  female current smoker with 106 pack-year history of smoking. Lung cancer screening examination. Additional history of melanoma. EXAM: CT CHEST WITHOUT CONTRAST LOW-DOSE FOR LUNG CANCER SCREENING TECHNIQUE: Multidetector CT imaging of the chest was performed following the standard protocol without IV contrast. RADIATION DOSE REDUCTION: This exam was performed according to the departmental dose-optimization program which includes automated exposure control, adjustment of the mA and/or kV according to patient size and/or use of iterative reconstruction technique. COMPARISON:  Chest CT 03/11/2018 FINDINGS: Cardiovascular: Heart size is normal. There is no significant pericardial fluid, thickening or pericardial calcification. There is aortic atherosclerosis, as well as atherosclerosis of the great vessels of the mediastinum and the coronary arteries, including calcified atherosclerotic plaque in the left main, left anterior descending, left circumflex and right coronary arteries. Calcifications of the aortic valve and mitral annulus. Mediastinum/Nodes: No pathologically enlarged mediastinal or hilar lymph nodes. Please note that accurate exclusion of hilar adenopathy is limited on noncontrast CT scans. Esophagus is unremarkable in appearance. No axillary lymphadenopathy. Lungs/Pleura: Multiple small pulmonary nodules are again noted in the lungs bilaterally, largest of which is in the posterior aspect of the left lower lobe (axial image 220) where there is a pleural-based nodule with a volume derived mean diameter of 11.1 mm, increased compared to prior studies. In terms of axial measurements on today's study versus the prior examination, the lesion currently measures 11 x 8 mm (previously 7 x 6 mm on 03/11/2018). No acute consolidative airspace disease. No pleural effusions. Postoperative changes of wedge resection in the right upper lobe are again noted. Mild diffuse bronchial wall thickening with very mild  centrilobular and paraseptal emphysema. Upper Abdomen: Subcentimeter low-attenuation lesion in segment 8 of the liver, incompletely characterized on today's noncontrast CT examination, but statistically likely to represent a cyst (no imaging follow-up recommended). Aortic atherosclerosis. Musculoskeletal: There are no aggressive appearing lytic or blastic lesions noted  in the visualized portions of the skeleton. IMPRESSION: 1. Enlarging left lower lobe pulmonary nodule, categorized as Lung-RADS 4A, suspicious. Follow up low-dose chest CT without contrast in 3 months (please use the following order, "CT CHEST LCS NODULE FOLLOW-UP W/O CM") is recommended. Alternatively, PET may be considered when there is a solid component 37m or larger. 2. The "S" modifier above refers to potentially clinically significant non lung cancer related findings. Specifically, there is aortic atherosclerosis, in addition to left main and 3 vessel coronary artery disease. Please note that although the presence of coronary artery calcium documents the presence of coronary artery disease, the severity of this disease and any potential stenosis cannot be assessed on this non-gated CT examination. Assessment for potential risk factor modification, dietary therapy or pharmacologic therapy may be warranted, if clinically indicated. 3. Mild diffuse bronchial wall thickening with very mild centrilobular and paraseptal emphysema; imaging findings suggestive of underlying COPD. 4. There are moderate calcifications of the aortic valve and severe calcifications of the mitral annulus. Echocardiographic correlation for evaluation of potential valvular dysfunction may be warranted if clinically indicated. These results will be called to the ordering clinician or representative by the Radiologist Assistant, and communication documented in the PACS or [employer] Aortic Atherosclerosis (ICD10-I70.0) and Emphysema (ICD10-J43.9). Electronically Signed    By: DVinnie LangtonM.D.   On: 07/20/2022 11:29    ASSESSMENT & PLAN:   62y.o. female with  1. Jak2 V617F mutation positive Myeloproliferative syndrome.- BM Bx consistent with Polycythemia Vera  JAK2 mutation identified in 08/15/12 labs  03/15/18 BM Bx revealed results consistent with JAK2 positive polycythemia vera  Labs done today show hemoglobin of 11.8, WBC count of 21.2k and platelets of 1k  2. H/o Medication/followup Non compliance  3. COPD - O2 dependant  4. History of Metastatic melanoma to the right lung  S/p resection by Dr. GServando Snareon 10/21/12, BRAF mutation not detected. -continue q645month skin screening her PCP/dermatologist -symptom directed radiologic scanning.   5.  Past Medical History:  Diagnosis Date   Agoraphobia    Anxiety    Arthritis    "all joints"   Chronic low back pain    Chronic respiratory failure with hypoxia ([name])    4L  via [place],  followed by pcp,   (09-16-2018  per pt only uses while at home and at night ,  portable oxygen is not working)   Cl[place] disorder [id number]   Polycythemia vera   Colon polyps    COPD (chronic obstructive pulmonary disease) ([place]   DDD (degenerative disc disease), lumbosacral    Dental caries    DM type 2 (diabetes mellitus, type 2) ([place]   followed by pcp   Fibromyalgia    GERD (gastroesophageal reflux disease)    occasional , no meds   Heart murmur 2020   History of basal cell carcinoma (BCC) excision    s/p  Moh's of face/ nose 12/ 2013   History of palpitations    per pt found to have occaional PVCs   History of UTI    HTN (hypertension)    Hyperlipidemia    Loose, teeth    Major depression    Metastatic melanoma ([facility] [place]22[facility]   12 mm posterior right upper lobe pulmonary nodule, max SUV 3.0  s/p  right wedge resection 10-21-2012,  followed by oncologist-- dr kaIrene Limbo no recurrence   Mixed stress and urge urinary incontinence    Myeloproliferative neoplasm ([place]   Narcolepsy  Obesity    OSA  (obstructive sleep apnea)    Oxygen deficiency    Oxygen dependent    4 L via [place],  per pt portable oxygen not working but does use while at home and at night   Panic attacks    Periodontitis    Peripheral neuropathy    feet   Polycythemia vera(238.4) hematology/ oncology-- dr Irene Limbo ([facility])   first dx 02014 due to smoker/copd--  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)  hx phlebotomies   Pulmonary nodules    bilateral small stable per CT 03-11-2018   Scoliosis    Sleep apnea    Smokers' cough ([place])    per pt productive a little in the morning's   Symptoms of upper respiratory infection (URI) 03/12/2018   Urinary (tract) obstruction    Wears glasses    PLAN: -Discussed lab results from today, 08/18/2022, with the patient. CBC shows elevated WBC of 15.7. Hemoglobin of 15.1, hematocrit of 49.7, and platelets of 404. CMP is stable.  -No phlebotomy today due to stable CBC.  -Discussed the recent CT Chest Lung from 07/20/2022. Which showed enlarging left lower lobe pulmonary nodule.  -Recommended to follow up with PCP and get a referral for pulmonologist for the enlarged lung nodule.  -Recommended to continue to follow up with GI for GI symptoms.  -Recommended PET scan and patient agrees with PET scan.  -Schedule PET scan due to melanoma history and enlarged lung nodule.  -Continue the hydroxyurea as prescribed without any dose modification. -She has no reported toxicities from her current dose of hydroxyurea at 1000 mg p.o. daily. -Recommended smoking cessation.  -Educated patient on the risks of smoking cigarettes.  FOLLOW UP: -Return to clinic with [name] with labs and appointment for therapeutic phlebotomy in 3 months -PET/CT in 2 weeks -phone visit in 3 weeks with Dr Irene Limbo  The total time spent in the appointment was 32 minutes* .  All of the patient's questions were answered with apparent satisfaction. The patient knows to call the clinic with any  problems, questions or concerns.   Sullivan Lone MD MS AAHIVMS [facility] Hematology/Oncology Physician [facility]  .*Total Encounter Time as defined by the Centers for Medicare and Medicaid Services includes, in addition to the face-to-face time of a patient visit (documented in the note above) non-face-to-face time: obtaining and reviewing outside history, ordering and reviewing medications, tests or procedures, care coordination (communications with other health care professionals or caregivers) and documentation in the medical record.   I, Cleda Mccreedy, am acting as a Education administrator for Sullivan Lone, MD. .I have reviewed the above documentation for accuracy and completeness, and I agree with the above. [name] MD

## 2022-08-18 NOTE — Telephone Encounter (Signed)
Left message to return call.  

## 2022-08-18 NOTE — Progress Notes (Signed)
Patient seen by MD today  Vitals are within treatment parameters.  Labs reviewed: and are within treatment parameters.  Per physician team, patient will not be receiving treatment today.

## 2022-08-18 NOTE — Telephone Encounter (Signed)
[facility] would like a call back @ [phone] ask for [place].

## 2022-08-19 NOTE — Telephone Encounter (Signed)
Left message to return my call.  

## 2022-08-19 NOTE — Telephone Encounter (Signed)
Spoke to [facility] and confirmed that patient was supposed to be scheduled in 3 months. Jose stated he would see if their scheduler can call patient to reschedule.

## 2022-08-20 ENCOUNTER — Inpatient Hospital Stay: Admission: RE | Admit: 2022-08-20 | Payer: Medicare HMO | Source: Ambulatory Visit

## 2022-08-24 ENCOUNTER — Encounter: Payer: Self-pay | Admitting: Hematology

## 2022-08-24 ENCOUNTER — Ambulatory Visit (HOSPITAL_COMMUNITY)
Admission: RE | Admit: 2022-08-24 | Discharge: 2022-08-24 | Disposition: A | Discharge status: Home / Self Care | Payer: Medicare HMO | Source: Ambulatory Visit | Attending: Hematology | Admitting: Hematology

## 2022-08-24 DIAGNOSIS — Z85820 Personal history of malignant melanoma of skin: Secondary | ICD-10-CM | POA: Insufficient documentation

## 2022-08-24 DIAGNOSIS — R918 Other nonspecific abnormal finding of lung field: Secondary | ICD-10-CM | POA: Diagnosis not present

## 2022-08-24 DIAGNOSIS — C439 Malignant melanoma of skin, unspecified: Secondary | ICD-10-CM | POA: Diagnosis not present

## 2022-08-24 DIAGNOSIS — C3490 Malignant neoplasm of unspecified part of unspecified bronchus or lung: Secondary | ICD-10-CM | POA: Diagnosis not present

## 2022-08-24 LAB — GLUCOSE, CAPILLARY: Glucose-Capillary: 111 mg/dL — ABNORMAL HIGH (ref 70–99)

## 2022-08-24 MED ORDER — FLUDEOXYGLUCOSE F - 18 (FDG) INJECTION
10.6000 | Freq: Once | INTRAVENOUS | Status: AC
  Administered 2022-08-24: 10.7 via INTRAVENOUS

## 2022-09-11 ENCOUNTER — Inpatient Hospital Stay: Payer: Medicare HMO | Attending: Hematology | Admitting: Hematology

## 2022-09-11 DIAGNOSIS — D45 Polycythemia vera: Secondary | ICD-10-CM | POA: Insufficient documentation

## 2022-09-11 DIAGNOSIS — Z9981 Dependence on supplemental oxygen: Secondary | ICD-10-CM | POA: Insufficient documentation

## 2022-09-11 DIAGNOSIS — Z7982 Long term (current) use of aspirin: Secondary | ICD-10-CM | POA: Diagnosis not present

## 2022-09-11 DIAGNOSIS — F17210 Nicotine dependence, cigarettes, uncomplicated: Secondary | ICD-10-CM | POA: Diagnosis not present

## 2022-09-11 DIAGNOSIS — J449 Chronic obstructive pulmonary disease, unspecified: Secondary | ICD-10-CM | POA: Diagnosis not present

## 2022-09-11 DIAGNOSIS — Z85118 Personal history of other malignant neoplasm of bronchus and lung: Secondary | ICD-10-CM | POA: Diagnosis not present

## 2022-09-11 DIAGNOSIS — Z79899 Other long term (current) drug therapy: Secondary | ICD-10-CM | POA: Insufficient documentation

## 2022-09-11 DIAGNOSIS — R918 Other nonspecific abnormal finding of lung field: Secondary | ICD-10-CM | POA: Diagnosis not present

## 2022-09-11 NOTE — Progress Notes (Signed)
HEMATOLOGY/ONCOLOGY TELEPHONE VISIT NOTE  Date of Service: 09/11/22   Patient Care Team: [name], MD as PCP - General (Family Medicine) [name], MD as Consulting Physician (Hematology) [name], [facility] as Pharmacist (Pharmacist)  CHIEF COMPLAINTS/PURPOSE OF CONSULTATION:  Evaluation and management of polycythemia vera  HISTORY OF PRESENTING ILLNESS:  Please see previous note for details of initial presentation  INTERVAL HISTORY:   Katelyn Cline is here for continued evaluation and management of her polycythemia vera.   Patient was last seen by me on 08/18/2022 and complained of coldness, increased fatigue, fluctuating weight, abnormal bowel movements, chills, hot flashes, an  occasional rectal pain.  I connected with [name] on 09/11/22 at  3:30 PM EDT by telephone visit and verified that I am speaking with the correct person using two identifiers.   I discussed the limitations, risks, security and privacy concerns of performing an evaluation and management service by telemedicine and the availability of in-person appointments. I also discussed with the patient that there may be a patient responsible charge related to this service. The patient expressed understanding and agreed to proceed.   Other persons participating in the visit and their role in the encounter: none   Patient's location: home  Provider's location: [facility]   Chief Complaint: Evaluation and management of polycythemia vera   Today, she reports that she has generally been feeling well overall, but has been sleeping fairly frequently. Patient continues to smoke cigarettes, but is working on cutting back. I discussed the details of her recent PET scan results with patient at great length.   MEDICAL HISTORY:  Past Medical History:  Diagnosis Date   Agoraphobia    Anxiety    Arthritis    "all joints"   Chronic low back pain    Chronic respiratory failure with hypoxia  ([name])    4L  via [place],  followed by pcp,   (09-16-2018  per pt only uses while at home and at night ,  portable oxygen is not working)   Clotting disorder ([place]) 2013   Polycythemia vera   Colon polyps    COPD (chronic obstructive pulmonary disease) ([place])    DDD (degenerative disc disease), lumbosacral    Dental caries    DM type 2 (diabetes mellitus, type 2) ([place])    followed by pcp   Fibromyalgia    GERD (gastroesophageal reflux disease)    occasional , no meds   Heart murmur 2020   History of basal cell carcinoma (BCC) excision    s/p  Moh's of face/ nose 12/ 2013   History of palpitations    per pt found to have occaional PVCs   History of UTI    HTN (hypertension)    Hyperlipidemia    Loose, teeth    Major depression    Metastatic melanoma ([place]) 10/18/2012   12 mm posterior right upper lobe pulmonary nodule, max SUV 3.0  s/p  right wedge resection 10-21-2012,  followed by oncologist-- dr Irene Limbo,  no recurrence   Mixed stress and urge urinary incontinence    Myeloproliferative neoplasm ([name])    Narcolepsy    Obesity    OSA (obstructive sleep apnea)    Oxygen deficiency    Oxygen dependent    4 L via [place],  per pt portable oxygen not working but does use while at home and at night   Panic attacks    Periodontitis    Peripheral neuropathy  feet   Polycythemia vera(238.4) hematology/ oncology-- dr Irene Limbo ([facility])   first dx 02014 due to smoker/copd--  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)  hx phlebotomies   Pulmonary nodules    bilateral small stable per CT 03-11-2018   Scoliosis    Sleep apnea    Smokers' cough ([place])    per pt productive a little in the morning's   Symptoms of upper respiratory infection (URI) 03/12/2018   Urinary (tract) obstruction    Wears glasses     SURGICAL HISTORY: Past Surgical History:  Procedure Laterality Date   ABDOMINAL HYSTERECTOMY  1998   partial   BREAST LUMPECTOMY Bilateral 1995   benign per pt    COLONOSCOPY W/ BIOPSIES AND POLYPECTOMY     Hx: of   CYSTECTOMY  2000   abdominal wall    DILATION AND CURETTAGE OF UTERUS  yrs ago   MOHS SURGERY  2013   nose/face   MULTIPLE EXTRACTIONS WITH ALVEOLOPLASTY N/A 09/19/2018   Procedure: Extraction of tooth #'s 2, 3, 6-14, 18, and 20-30 with alveoloplasty;  Surgeon: [name], DDS;  Location: [facility];  Service: Oral Surgery;  Laterality: N/A;  GENERAL WITH NASAL TUBE   VIDEO ASSISTED THORACOSCOPY (VATS)/WEDGE RESECTION Right 10/21/2012   Procedure: VIDEO ASSISTED THORACOSCOPY (VATS)/WEDGE RESECTION;  Surgeon: [name], MD;  Location: [place];  Service: Thoracic;  Laterality: Right;   VIDEO BRONCHOSCOPY N/A 10/21/2012   Procedure: VIDEO BRONCHOSCOPY;  Surgeon: [name], MD;  Location: [facility] OR;  Service: Thoracic;  Laterality: N/A;    SOCIAL HISTORY: Social History   Socioeconomic History   Marital status: Divorced    Spouse name: Not on file   Number of children: 1   Years of education: 12+   Highest education level: Not on file  Occupational History   Occupation: disabled  Tobacco Use   Smoking status: Every Day    Packs/day: 0.50    Years: 50.00    Additional pack years: 0.00    Total pack years: 25.00    Types: Cigarettes   Smokeless tobacco: Never   Tobacco comments:    Quit a few times  Vaping Use   Vaping Use: Former   Quit date: 09/15/2016  Substance and Sexual Activity   Alcohol use: Yes    Alcohol/week: 1.0 standard drink of alcohol    Types: 1 Standard drinks or equivalent per week    Comment: Not weekly   Drug use: Never   Sexual activity: Not Currently    Birth control/protection: Surgical  Other Topics Concern   Not on file  Social History Narrative   Son-lives w/son.   disabled   Social Determinants of Health   Financial Resource Strain: Low Risk  (12/26/2021)   Overall Financial Resource Strain (CARDIA)    Difficulty of Paying Living Expenses: Not hard at all  Food Insecurity:  No Food Insecurity (12/26/2021)   Hunger Vital Sign    Worried About Running Out of Food in the Last Year: Never true    Ran Out of Food in the Last Year: Never true  Transportation Needs: No Transportation Needs (12/26/2021)   PRAPARE - Hydrologist (Medical): No    Lack of Transportation (Non-Medical): No  Physical Activity: Inactive (12/26/2021)   Exercise Vital Sign    Days of Exercise per Week: 0 days    Minutes of Exercise per Session: 0 min  Stress: Stress Concern Present (12/26/2021)  [place] Questionnaire    Feeling of Stress : Rather much  Social Connections: Socially Isolated (12/26/2021)   Social Connection and Isolation Panel [NHANES]    Frequency of Communication with Friends and Family: Once a week    Frequency of Social Gatherings with Friends and Family: Once a week    Attends Religious Services: Never    Marine scientist or Organizations: No    Attends Archivist Meetings: Never    Marital Status: Divorced  Human resources officer Violence: Not At Risk (12/26/2021)   Humiliation, Afraid, Rape, and Kick questionnaire    Fear of Current or Ex-Partner: No    Emotionally Abused: No    Physically Abused: No    Sexually Abused: No    FAMILY HISTORY: Family History  Problem Relation Age of Onset   Arthritis Mother    Hyperlipidemia Mother    Depression Mother    Anxiety disorder Mother    Dementia Mother    Hypertension Father    Hyperlipidemia Father    Heart disease Father    Stroke Father    Dementia Father    Heart disease Brother    ADD / ADHD Son    Alcohol abuse Maternal Grandfather    Bipolar disorder Neg Hx    Drug abuse Neg Hx    OCD Neg Hx    Paranoid behavior Neg Hx    Schizophrenia Neg Hx    Seizures Neg Hx    Sexual abuse Neg Hx    Physical abuse Neg Hx     ALLERGIES:  is allergic to contrast media [iodinated contrast media], erythromycin, flagyl  [metronidazole], latex, other, penicillins, and tetracyclines & related.  MEDICATIONS:  Current Outpatient Medications  Medication Sig Dispense Refill   amLODipine (NORVASC) 5 MG tablet TAKE ONE TABLET BY MOUTH EVERYDAY AT BEDTIME 90 tablet 1   aspirin EC 81 MG tablet Take 1 tablet (81 mg total) by mouth daily. 90 tablet 1   atorvastatin (LIPITOR) 80 MG tablet TAKE ONE TABLET BY MOUTH EVERYDAY AT BEDTIME 90 tablet 1   baclofen (LIORESAL) 20 MG tablet Take 20 mg by mouth 2 (two) times daily as needed.     Blood Glucose Monitoring Suppl (BLOOD GLUCOSE SYSTEM PAK) KIT Please dispense based on patient and insurance preference. Use as directed to monitor FSBS 2x daily. Dx: E11.9 1 kit 1   buPROPion ER (WELLBUTRIN SR) 100 MG 12 hr tablet TAKE ONE TABLET BY MOUTH EVERY MORNING and TAKE ONE TABLET BY MOUTH EVERYDAY AT BEDTIME 180 tablet 1   DULoxetine (CYMBALTA) 60 MG capsule 1 tab po bid 180 capsule 1   fenofibrate (TRICOR) 145 MG tablet Take 1 tablet (145 mg total) by mouth daily. 90 tablet 1   fish oil-omega-3 fatty acids 1000 MG capsule Take 2 capsules (2 g total) by mouth 2 (two) times daily. 180 capsule 1   gabapentin (NEURONTIN) 100 MG capsule take 1-3 capsules BY MOUTH three times daily AS NEEDED FOR nerve pain 270 capsule 1   glucose blood (ACCU-CHEK GUIDE) test strip Use as instructed 100 each 12   Glucose Blood (BLOOD GLUCOSE TEST STRIPS) STRP Please dispense based on patient and insurance preference. Use as directed to monitor FSBS 2x daily. Dx: E11.9 100 strip 1   hydrochlorothiazide (HYDRODIURIL) 25 MG tablet Take 1 tablet (25 mg total) by mouth every morning. 90 tablet 1   hydroxyurea (HYDREA) 500 MG capsule TAKE TWO CAPSULES BY MOUTH  EVERYDAY AT BEDTIME 90 capsule 3   lisinopril (ZESTRIL) 5 MG tablet TAKE ONE TABLET BY MOUTH EVERYDAY AT BEDTIME 90 tablet 1   metFORMIN (GLUCOPHAGE) 1000 MG tablet TAKE ONE TABLET BY MOUTH EVERY MORNING and TAKE ONE TABLET BY MOUTH EVERYDAY AT BEDTIME 180  tablet 1   Microlet Lancets MISC Please dispense based on patient and insurance preference. Use as directed to monitor FSBS 2x daily. Dx: E11.9 100 each 11   nystatin (MYCOSTATIN/NYSTOP) powder Apply 1 Application topically 3 (three) times daily. PA Case: [id number] Approved 06/30/2019- 06/28/2020 60 g 2   nystatin cream (MYCOSTATIN) Apply 1 Application topically 2 (two) times daily. To affected areas 60 g 2   oxyCODONE-acetaminophen (PERCOCET) 10-325 MG tablet Take 1 tablet by mouth 4 (four) times daily as needed for severe pain.     senna (SENOKOT) 8.6 MG TABS tablet Take 2 tablets by mouth daily as needed for mild constipation.     Vitamin D, Ergocalciferol, (DRISDOL) 1.25 MG (50000 UNIT) CAPS capsule TAKE ONE TABLET BY MOUTH on sundays at bedtime 12 capsule 1   No current facility-administered medications for this visit.    REVIEW OF SYSTEMS:    10 Point review of Systems was done is negative except as noted above.   PHYSICAL EXAMINATION: TELEPHONE VISIT  LABORATORY DATA:  I have reviewed the data as listed  .    Latest Ref Rng & Units 08/18/2022    8:18 AM 05/19/2022    8:39 AM 10/29/2021    9:34 AM  CBC  WBC 4.0 - 10.5 K/uL 15.7  12.8  20.6   Hemoglobin 12.0 - 15.0 g/dL 15.1  14.3  13.2   Hematocrit 36.0 - 46.0 % 49.7  47.6  45.5   Platelets 150 - 400 K/uL 404  261  628    . CBC    Component Value Date/Time   WBC 15.7 (H) 08/18/2022 0818   WBC 20.6 (H) 10/29/2021 0934   RBC 5.69 (H) 08/18/2022 0818   HGB 15.1 (H) 08/18/2022 0818   HCT 49.7 (H) 08/18/2022 0818   PLT 404 (H) 08/18/2022 0818   MCV 87.3 08/18/2022 0818   MCH 26.5 08/18/2022 0818   MCHC 30.4 08/18/2022 0818   RDW 19.2 (H) 08/18/2022 0818   LYMPHSABS 1.9 08/18/2022 0818   MONOABS 0.5 08/18/2022 0818   EOSABS 0.3 08/18/2022 0818   BASOSABS 0.2 (H) 08/18/2022 0818     .    Latest Ref Rng & Units 08/18/2022    8:18 AM 05/19/2022    8:39 AM 05/18/2022    9:59 AM  CMP  Glucose 70 - 99 mg/dL 115  135   148   BUN 8 - 23 mg/dL 30  18  21    Creatinine 0.44 - 1.00 mg/dL 0.90  0.49  0.59   Sodium 135 - 145 mmol/L 136  138  140   Potassium 3.5 - 5.1 mmol/L 4.5  4.2  4.3   Chloride 98 - 111 mmol/L 99  104  103   CO2 22 - 32 mmol/L 31  27  28    Calcium 8.9 - 10.3 mg/dL 10.0  9.6  8.8   Total Protein 6.5 - 8.1 g/dL 7.5  7.2  6.8   Total Bilirubin 0.3 - 1.2 mg/dL 0.3  0.4  0.4   Alkaline Phos 38 - 126 U/L 110  90  89   AST 15 - 41 U/L 14  12  14    ALT 0 - 44  U/L 12  11  9     08/15/12 JAK2 Mutation:     03/15/18 BM Bx:      RADIOGRAPHIC STUDIES: I have personally reviewed the radiological images as listed and agreed with the findings in the report. NM PET Image Initial (PI) Whole Body  Result Date: 08/26/2022 CLINICAL DATA:  Subsequent treatment strategy for metastatic melanoma. Increasing lung nodule. EXAM: NUCLEAR MEDICINE PET WHOLE BODY TECHNIQUE: 10.7 mCi F-18 FDG was injected intravenously. Full-ring PET imaging was performed from the head to foot after the radiotracer. CT data was obtained and used for attenuation correction and anatomic localization. Fasting blood glucose: 111 mg/dl COMPARISON:  Lung cancer screening chest CT 07/20/2022. Chest abdomen pelvis CT 03/11/2018 FINDINGS: Mediastinal blood pool activity: SUV max 2.9 HEAD/NECK: No hypermetabolic activity in the scalp. No hypermetabolic cervical lymph nodes. Incidental CT findings: none CHEST: No hypermetabolic mediastinal or hilar nodes. The 11 mm posterior left lower lobe pulmonary nodule of concern on recent lung cancer screening chest CT shows no hypermetabolism on PET imaging with SUV max = 1.2. Incidental CT findings: Coronary artery calcification is evident. Aortic valve and mitral annular calcification evident. Mild atherosclerotic calcification is noted in the wall of the thoracic aorta. Centrilobular emphsyema noted. ABDOMEN/PELVIS: No abnormal hypermetabolic activity within the liver, pancreas, adrenal glands, or spleen. No  hypermetabolic lymph nodes in the abdomen or pelvis. Incidental CT findings: There is moderate atherosclerotic calcification of the abdominal aorta without aneurysm. Left colonic diverticulosis without diverticulitis left SKELETON: Mild diffusely heterogeneous FDG accumulation is identified in the bony anatomy, nonspecific no discrete overtly concerning hypermetabolic bone lesion. Incidental CT findings: none EXTREMITIES: No abnormal hypermetabolic activity in the lower extremities. Incidental CT findings: none IMPRESSION: 1. The 11 mm posterior left lower lobe pulmonary nodule of concern on recent lung cancer screening chest CT shows no hypermetabolism on PET imaging. While this is reassuring, low-grade or well differentiated neoplasm can be poorly FDG avid and continued close follow-up recommended. Tissue sampling may be warranted. 2. No suspicious soft tissue hypermetabolism in the neck, chest, abdomen, or pelvis. Diffuse mildly increased heterogeneous uptake in the bony anatomy is nonspecific although can be noted in marrow stimulatory effects from therapy or myeloproliferative disorder. 3. Left colonic diverticulosis without diverticulitis. 4.  Aortic Atherosclerosis (ICD10-I70.0). Electronically Signed   By: Misty Stanley M.D.   On: 08/26/2022 06:24    ASSESSMENT & PLAN:   62 y.o. female with  1. Jak2 V617F mutation positive Myeloproliferative syndrome.- BM Bx consistent with Polycythemia Vera  JAK2 mutation identified in 08/15/12 labs  03/15/18 BM Bx revealed results consistent with JAK2 positive polycythemia vera  Labs done today show hemoglobin of 11.8, WBC count of 21.2k and platelets of 1k  2. H/o Medication/followup Non compliance  3. COPD - O2 dependant  4. History of Metastatic melanoma to the right lung  S/p resection by Dr. Servando Snare on 10/21/12, BRAF mutation not detected. -continue q12monthly skin screening her PCP/dermatologist -symptom directed radiologic scanning.   5.  Past  Medical History:  Diagnosis Date   Agoraphobia    Anxiety    Arthritis    "all joints"   Chronic low back pain    Chronic respiratory failure with hypoxia ([name])    4L  via [place],  followed by pcp,   (09-16-2018  per pt only uses while at home and at night ,  portable oxygen is not working)   Clotting disorder ([place]) 2013   Polycythemia vera   Colon polyps  COPD (chronic obstructive pulmonary disease) ([name])    DDD (degenerative disc disease), lumbosacral    Dental caries    DM type 2 (diabetes mellitus, type 2) ([place])    followed by pcp   Fibromyalgia    GERD (gastroesophageal reflux disease)    occasional , no meds   Heart murmur 2020   History of basal cell carcinoma (BCC) excision    s/p  Moh's of face/ nose 12/ 2013   History of palpitations    per pt found to have occaional PVCs   History of UTI    HTN (hypertension)    Hyperlipidemia    Loose, teeth    Major depression    Metastatic melanoma ([place]) 10/18/2012   12 mm posterior right upper lobe pulmonary nodule, max SUV 3.0  s/p  right wedge resection 10-21-2012,  followed by oncologist-- dr Irene Limbo,  no recurrence   Mixed stress and urge urinary incontinence    Myeloproliferative neoplasm ([place])    Narcolepsy    Obesity    OSA (obstructive sleep apnea)    Oxygen deficiency    Oxygen dependent    4 L via ,  per pt portable oxygen not working but does use while at home and at night   Panic attacks    Periodontitis    Peripheral neuropathy    feet   Polycythemia vera(238.4) hematology/ oncology-- dr Irene Limbo ([facility])   first dx 02014 due to smoker/copd--  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)  hx phlebotomies   Pulmonary nodules    bilateral small stable per CT 03-11-2018   Scoliosis    Sleep apnea    Smokers' cough ([place])    per pt productive a little in the morning's   Symptoms of upper respiratory infection (URI) 03/12/2018   Urinary (tract) obstruction    Wears glasses     PLAN:  -Discussed lab results from 08/18/2022 with patient. CBC showed WBC of 15.7K, hemoglobin of 15.1, and platelets of 404K. -Discussed recent CT chest scan which showed small nodule in left lower lobe of lung, about 1 cm in size -discussed 08/26/2022 PET scan which did not reveal activity regarding spot found in CT chest scan. No other obvious spots look like tumor -could be inflammatory nodule or scar tissue, and cannot completely rule out slow growing cancer, though it is not active -discussed proceeding options including: Refer patient to pulmonologist to see if they would like to proceed with a biopsy/bronchoscopy for further evaluation. Discussed that pt's breathing issues and oxygen requirement makes procedure more involved. Reevaluate with CT scan to continue to monitor  -patient would would like to proceed with a conservative approach with continued monitoring -has not previously seen a pulmonologist for COPD -bone marrow slightly active likely due to polycythemia vera -Recommended smoking cessation and advised patient to limit smoking as much as she can -continue with phlebotomies -She has no reported toxicities from her current dose of hydroxyurea at 1000 mg p.o. daily. -Continue the hydroxyurea as prescribed without any dose modification. -order CT scan after next follow up in 3-4 months   FOLLOW UP: F/u as per scheduled appointments on 11/10/2022  The total time spent in the appointment was 20 minutes* .  All of the patient's questions were answered with apparent satisfaction. The patient knows to call the clinic with any problems, questions or concerns.   Sullivan Lone MD MS AAHIVMS [facility] Hematology/Oncology Physician [facility]  .*Total Encounter Time as defined by  the Centers for Medicare and Medicaid Services includes, in addition to the face-to-face time of a patient visit (documented in the note above) non-face-to-face time: obtaining and reviewing  outside history, ordering and reviewing medications, tests or procedures, care coordination (communications with other health care professionals or caregivers) and documentation in the medical record.    I,Mitra Faeizi,acting as a Education administrator for Sullivan Lone, MD.,have documented all relevant documentation on the behalf of Sullivan Lone, MD,as directed by  Sullivan Lone, MD while in the presence of Sullivan Lone, MD.  .I have reviewed the above documentation for accuracy and completeness, and I agree with the above. [name] MD

## 2022-09-15 DIAGNOSIS — E119 Type 2 diabetes mellitus without complications: Secondary | ICD-10-CM | POA: Diagnosis not present

## 2022-09-15 DIAGNOSIS — F17210 Nicotine dependence, cigarettes, uncomplicated: Secondary | ICD-10-CM | POA: Diagnosis not present

## 2022-09-15 DIAGNOSIS — Z6833 Body mass index (BMI) 33.0-33.9, adult: Secondary | ICD-10-CM | POA: Diagnosis not present

## 2022-09-15 DIAGNOSIS — M5136 Other intervertebral disc degeneration, lumbar region: Secondary | ICD-10-CM | POA: Diagnosis not present

## 2022-09-15 DIAGNOSIS — R030 Elevated blood-pressure reading, without diagnosis of hypertension: Secondary | ICD-10-CM | POA: Diagnosis not present

## 2022-09-15 DIAGNOSIS — F17200 Nicotine dependence, unspecified, uncomplicated: Secondary | ICD-10-CM | POA: Diagnosis not present

## 2022-09-15 DIAGNOSIS — G894 Chronic pain syndrome: Secondary | ICD-10-CM | POA: Diagnosis not present

## 2022-09-15 DIAGNOSIS — R079 Chest pain, unspecified: Secondary | ICD-10-CM | POA: Diagnosis not present

## 2022-09-15 DIAGNOSIS — M5030 Other cervical disc degeneration, unspecified cervical region: Secondary | ICD-10-CM | POA: Diagnosis not present

## 2022-09-18 ENCOUNTER — Encounter: Payer: Self-pay | Admitting: Hematology

## 2022-10-13 DIAGNOSIS — Z6832 Body mass index (BMI) 32.0-32.9, adult: Secondary | ICD-10-CM | POA: Diagnosis not present

## 2022-10-13 DIAGNOSIS — F17200 Nicotine dependence, unspecified, uncomplicated: Secondary | ICD-10-CM | POA: Diagnosis not present

## 2022-10-13 DIAGNOSIS — R079 Chest pain, unspecified: Secondary | ICD-10-CM | POA: Diagnosis not present

## 2022-10-13 DIAGNOSIS — R030 Elevated blood-pressure reading, without diagnosis of hypertension: Secondary | ICD-10-CM | POA: Diagnosis not present

## 2022-10-13 DIAGNOSIS — F17210 Nicotine dependence, cigarettes, uncomplicated: Secondary | ICD-10-CM | POA: Diagnosis not present

## 2022-10-13 DIAGNOSIS — M5136 Other intervertebral disc degeneration, lumbar region: Secondary | ICD-10-CM | POA: Diagnosis not present

## 2022-10-13 DIAGNOSIS — G894 Chronic pain syndrome: Secondary | ICD-10-CM | POA: Diagnosis not present

## 2022-10-13 DIAGNOSIS — M5030 Other cervical disc degeneration, unspecified cervical region: Secondary | ICD-10-CM | POA: Diagnosis not present

## 2022-10-13 DIAGNOSIS — E119 Type 2 diabetes mellitus without complications: Secondary | ICD-10-CM | POA: Diagnosis not present

## 2022-10-27 ENCOUNTER — Other Ambulatory Visit: Payer: Self-pay | Admitting: Family Medicine

## 2022-10-27 NOTE — Telephone Encounter (Signed)
OK to refill? Or does Vit D need to be check prior to refill? Please advise

## 2022-11-05 ENCOUNTER — Other Ambulatory Visit: Payer: Self-pay

## 2022-11-05 DIAGNOSIS — D45 Polycythemia vera: Secondary | ICD-10-CM

## 2022-11-10 ENCOUNTER — Inpatient Hospital Stay: Payer: Medicare HMO | Attending: Hematology

## 2022-11-10 ENCOUNTER — Other Ambulatory Visit: Payer: Self-pay

## 2022-11-10 ENCOUNTER — Inpatient Hospital Stay (HOSPITAL_BASED_OUTPATIENT_CLINIC_OR_DEPARTMENT_OTHER): Payer: Medicare HMO | Admitting: Hematology

## 2022-11-10 ENCOUNTER — Inpatient Hospital Stay: Payer: Medicare HMO

## 2022-11-10 VITALS — BP 174/69 | HR 76 | Temp 98.1°F | Resp 18 | Wt 213.1 lb

## 2022-11-10 DIAGNOSIS — F17210 Nicotine dependence, cigarettes, uncomplicated: Secondary | ICD-10-CM | POA: Diagnosis not present

## 2022-11-10 DIAGNOSIS — Z79899 Other long term (current) drug therapy: Secondary | ICD-10-CM | POA: Insufficient documentation

## 2022-11-10 DIAGNOSIS — Z7982 Long term (current) use of aspirin: Secondary | ICD-10-CM | POA: Diagnosis not present

## 2022-11-10 DIAGNOSIS — D45 Polycythemia vera: Secondary | ICD-10-CM | POA: Diagnosis not present

## 2022-11-10 LAB — CBC WITH DIFFERENTIAL (CANCER CENTER ONLY)
Abs Immature Granulocytes: 0.08 10*3/uL — ABNORMAL HIGH (ref 0.00–0.07)
Basophils Absolute: 0.1 10*3/uL (ref 0.0–0.1)
Basophils Relative: 1 %
Eosinophils Absolute: 0.2 10*3/uL (ref 0.0–0.5)
Eosinophils Relative: 2 %
HCT: 46.9 % — ABNORMAL HIGH (ref 36.0–46.0)
Hemoglobin: 14.9 g/dL (ref 12.0–15.0)
Immature Granulocytes: 1 %
Lymphocytes Relative: 11 %
Lymphs Abs: 1.1 10*3/uL (ref 0.7–4.0)
MCH: 29.4 pg (ref 26.0–34.0)
MCHC: 31.8 g/dL (ref 30.0–36.0)
MCV: 92.5 fL (ref 80.0–100.0)
Monocytes Absolute: 0.3 10*3/uL (ref 0.1–1.0)
Monocytes Relative: 3 %
Neutro Abs: 7.6 10*3/uL (ref 1.7–7.7)
Neutrophils Relative %: 82 %
Platelet Count: 303 10*3/uL (ref 150–400)
RBC: 5.07 MIL/uL (ref 3.87–5.11)
RDW: 18.9 % — ABNORMAL HIGH (ref 11.5–15.5)
WBC Count: 9.3 10*3/uL (ref 4.0–10.5)
nRBC: 0 % (ref 0.0–0.2)

## 2022-11-10 LAB — FERRITIN: Ferritin: 10 ng/mL — ABNORMAL LOW (ref 11–307)

## 2022-11-10 LAB — CMP (CANCER CENTER ONLY)
ALT: 11 U/L (ref 0–44)
AST: 12 U/L — ABNORMAL LOW (ref 15–41)
Albumin: 4 g/dL (ref 3.5–5.0)
Alkaline Phosphatase: 89 U/L (ref 38–126)
Anion gap: 7 (ref 5–15)
BUN: 26 mg/dL — ABNORMAL HIGH (ref 8–23)
CO2: 29 mmol/L (ref 22–32)
Calcium: 9.4 mg/dL (ref 8.9–10.3)
Chloride: 103 mmol/L (ref 98–111)
Creatinine: 0.57 mg/dL (ref 0.44–1.00)
GFR, Estimated: >60 mL/min (ref >60)
Glucose, Bld: 142 mg/dL — ABNORMAL HIGH (ref 70–99)
Potassium: 4.2 mmol/L (ref 3.5–5.1)
Sodium: 139 mmol/L (ref 135–145)
Total Bilirubin: 0.3 mg/dL (ref 0.3–1.2)
Total Protein: 7.1 g/dL (ref 6.5–8.1)

## 2022-11-10 LAB — IRON AND IRON BINDING CAPACITY (CC-WL,HP ONLY)
Iron: 31 ug/dL (ref 28–170)
Saturation Ratios: 6 % — ABNORMAL LOW (ref 10.4–31.8)
TIBC: 542 ug/dL — ABNORMAL HIGH (ref 250–450)
UIBC: 511 ug/dL — ABNORMAL HIGH (ref 148–442)

## 2022-11-10 NOTE — Progress Notes (Signed)
HEMATOLOGY/ONCOLOGY CLINIC NOTE  Date of Service: 11/10/22   Patient Care Team: [name], MD as PCP - General (Family Medicine) [name], MD as Consulting Physician (Hematology) Erroll Luna, [facility] as Pharmacist (Pharmacist)  CHIEF COMPLAINTS/PURPOSE OF CONSULTATION:  Evaluation and management of polycythemia vera  HISTORY OF PRESENTING ILLNESS:  Please see previous note for details of initial presentation  INTERVAL HISTORY:   Katelyn Cline is here for continued evaluation and management of her polycythemia vera.   I had a phone visit with the patient on 09/11/2022 and she was doing well overall.   Patient reports she has been doing well overall without any new or severe medical concerns since our last visit. She notes she has cut down her smoking since our last visit. She smokes around half a pack of cigarettes everyday.   She still has oxygen tank at home.   She denies any infection issue, fever, chills, night sweats, abdominal pain, chest pain, back pain, fatigue, abnormal bowel movement, or leg swelling.    Patient is regularly taking Hydroxyurea as prescribed. She notes she has been tolerating her hydroxyurea well without any new or severe toxicities.   MEDICAL HISTORY:  Past Medical History:  Diagnosis Date   Agoraphobia    Anxiety    Arthritis    "all joints"   Chronic low back pain    Chronic respiratory failure with hypoxia ([name])    4L  via [place],  followed by pcp,   (09-16-2018  per pt only uses while at home and at night ,  portable oxygen is not working)   Clotting disorder ([name]) 2013   Polycythemia vera   Colon polyps    COPD (chronic obstructive pulmonary disease) ([name])    DDD (degenerative disc disease), lumbosacral    Dental caries    DM type 2 (diabetes mellitus, type 2) ([name])    followed by pcp   Fibromyalgia    GERD (gastroesophageal reflux disease)    occasional , no meds   Heart murmur 2020   History of basal cell  carcinoma (BCC) excision    s/p  Moh's of face/ nose 12/ 2013   History of palpitations    per pt found to have occaional PVCs   History of UTI    HTN (hypertension)    Hyperlipidemia    Loose, teeth    Major depression    Metastatic melanoma ([name]) 10/18/2012   12 mm posterior right upper lobe pulmonary nodule, max SUV 3.0  s/p  right wedge resection 10-21-2012,  followed by oncologist-- dr Candise Che,  no recurrence   Mixed stress and urge urinary incontinence    Myeloproliferative neoplasm ([name])    Narcolepsy    Obesity    OSA (obstructive sleep apnea)    Oxygen deficiency    Oxygen dependent    4 L via [place],  per pt portable oxygen not working but does use while at home and at night   Panic attacks    Periodontitis    Peripheral neuropathy    feet   Polycythemia vera(238.4) hematology/ oncology-- dr Candise Che ([facility])   first dx 02014 due to smoker/copd--  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)  hx phlebotomies   Pulmonary nodules    bilateral small stable per CT 03-11-2018   Scoliosis    Sleep apnea    Smokers' cough ([name])    per pt productive a little in the morning's   Symptoms of upper  respiratory infection (URI) 03/12/2018   Urinary (tract) obstruction    Wears glasses     SURGICAL HISTORY: Past Surgical History:  Procedure Laterality Date   ABDOMINAL HYSTERECTOMY  1998   partial   BREAST LUMPECTOMY Bilateral 1995   benign per pt   COLONOSCOPY W/ BIOPSIES AND POLYPECTOMY     Hx: of   CYSTECTOMY  2000   abdominal wall    DILATION AND CURETTAGE OF UTERUS  yrs ago   MOHS SURGERY  2013   nose/face   MULTIPLE EXTRACTIONS WITH ALVEOLOPLASTY N/A 09/19/2018   Procedure: Extraction of tooth #'s 2, 3, 6-14, 18, and 20-30 with alveoloplasty;  Surgeon: Charlynne Pander, DDS;  Location: [facility];  Service: Oral Surgery;  Laterality: N/A;  GENERAL WITH NASAL TUBE   VIDEO ASSISTED THORACOSCOPY (VATS)/WEDGE RESECTION Right 10/21/2012   Procedure: VIDEO  ASSISTED THORACOSCOPY (VATS)/WEDGE RESECTION;  Surgeon: Delight Ovens, MD;  Location: [facility] OR;  Service: Thoracic;  Laterality: Right;   VIDEO BRONCHOSCOPY N/A 10/21/2012   Procedure: VIDEO BRONCHOSCOPY;  Surgeon: Delight Ovens, MD;  Location: [facility] OR;  Service: Thoracic;  Laterality: N/A;    SOCIAL HISTORY: Social History   Socioeconomic History   Marital status: Divorced    Spouse name: Not on file   Number of children: 1   Years of education: 12+   Highest education level: Not on file  Occupational History   Occupation: disabled  Tobacco Use   Smoking status: Every Day    Packs/day: 0.50    Years: 50.00    Additional pack years: 0.00    Total pack years: 25.00    Types: Cigarettes   Smokeless tobacco: Never   Tobacco comments:    Quit a few times  Vaping Use   Vaping Use: Former   Quit date: 09/15/2016  Substance and Sexual Activity   Alcohol use: Yes    Alcohol/week: 1.0 standard drink of alcohol    Types: 1 Standard drinks or equivalent per week    Comment: Not weekly   Drug use: Never   Sexual activity: Not Currently    Birth control/protection: Surgical  Other Topics Concern   Not on file  Social History Narrative   Son-lives w/son.   disabled   Social Determinants of Health   Financial Resource Strain: Low Risk  (12/26/2021)   Overall Financial Resource Strain (CARDIA)    Difficulty of Paying Living Expenses: Not hard at all  Food Insecurity: No Food Insecurity (12/26/2021)   Hunger Vital Sign    Worried About Running Out of Food in the Last Year: Never true    Ran Out of Food in the Last Year: Never true  Transportation Needs: No Transportation Needs (12/26/2021)   PRAPARE - Administrator, Civil Service (Medical): No    Lack of Transportation (Non-Medical): No  Physical Activity: Inactive (12/26/2021)   Exercise Vital Sign    Days of Exercise per Week: 0 days    Minutes of Exercise per Session: 0 min  Stress: Stress Concern Present  (12/26/2021)   [employer] of Occupational Health - Occupational Stress Questionnaire    Feeling of Stress : Rather much  Social Connections: Socially Isolated (12/26/2021)   Social Connection and Isolation Panel [NHANES]    Frequency of Communication with Friends and Family: Once a week    Frequency of Social Gatherings with Friends and Family: Once a week    Attends Religious Services: Never    Production manager of [employer]  or Organizations: No    Attends Banker Meetings: Never    Marital Status: Divorced  Catering manager Violence: Not At Risk (12/26/2021)   Humiliation, Afraid, Rape, and Kick questionnaire    Fear of Current or Ex-Partner: No    Emotionally Abused: No    Physically Abused: No    Sexually Abused: No    FAMILY HISTORY: Family History  Problem Relation Age of Onset   Arthritis Mother    Hyperlipidemia Mother    Depression Mother    Anxiety disorder Mother    Dementia Mother    Hypertension Father    Hyperlipidemia Father    Heart disease Father    Stroke Father    Dementia Father    Heart disease Brother    ADD / ADHD Son    Alcohol abuse Maternal Grandfather    Bipolar disorder Neg Hx    Drug abuse Neg Hx    OCD Neg Hx    Paranoid behavior Neg Hx    Schizophrenia Neg Hx    Seizures Neg Hx    Sexual abuse Neg Hx    Physical abuse Neg Hx     ALLERGIES:  is allergic to contrast media [iodinated contrast media], erythromycin, flagyl [metronidazole], latex, other, penicillins, and tetracyclines & related.  MEDICATIONS:  Current Outpatient Medications  Medication Sig Dispense Refill   amLODipine (NORVASC) 5 MG tablet TAKE ONE TABLET BY MOUTH EVERYDAY AT BEDTIME 90 tablet 1   aspirin EC 81 MG tablet Take 1 tablet (81 mg total) by mouth daily. 90 tablet 1   atorvastatin (LIPITOR) 80 MG tablet TAKE ONE TABLET BY MOUTH EVERYDAY AT BEDTIME 90 tablet 1   baclofen (LIORESAL) 20 MG tablet Take 20 mg by mouth 2 (two) times daily as needed.     Blood  Glucose Monitoring Suppl (BLOOD GLUCOSE SYSTEM PAK) KIT Please dispense based on patient and insurance preference. Use as directed to monitor FSBS 2x daily. Dx: E11.9 1 kit 1   buPROPion ER (WELLBUTRIN SR) 100 MG 12 hr tablet TAKE ONE TABLET BY MOUTH EVERY MORNING and TAKE ONE TABLET BY MOUTH EVERYDAY AT BEDTIME 180 tablet 1   DULoxetine (CYMBALTA) 60 MG capsule 1 tab po bid 180 capsule 1   fenofibrate (TRICOR) 145 MG tablet Take 1 tablet (145 mg total) by mouth daily. 90 tablet 1   fish oil-omega-3 fatty acids 1000 MG capsule Take 2 capsules (2 g total) by mouth 2 (two) times daily. 180 capsule 1   gabapentin (NEURONTIN) 100 MG capsule take 1-3 capsules BY MOUTH three times daily AS NEEDED FOR nerve pain 270 capsule 1   glucose blood (ACCU-CHEK GUIDE) test strip Use as instructed 100 each 12   Glucose Blood (BLOOD GLUCOSE TEST STRIPS) STRP Please dispense based on patient and insurance preference. Use as directed to monitor FSBS 2x daily. Dx: E11.9 100 strip 1   hydrochlorothiazide (HYDRODIURIL) 25 MG tablet Take 1 tablet (25 mg total) by mouth every morning. 90 tablet 1   hydroxyurea (HYDREA) 500 MG capsule TAKE TWO CAPSULES BY MOUTH EVERYDAY AT BEDTIME 90 capsule 3   lisinopril (ZESTRIL) 5 MG tablet TAKE ONE TABLET BY MOUTH EVERYDAY AT BEDTIME 90 tablet 1   metFORMIN (GLUCOPHAGE) 1000 MG tablet TAKE ONE TABLET BY MOUTH EVERY MORNING and TAKE ONE TABLET BY MOUTH EVERYDAY AT BEDTIME 180 tablet 1   Microlet Lancets MISC Please dispense based on patient and insurance preference. Use as directed to monitor FSBS 2x daily. Dx: E11.9  100 each 11   nystatin (MYCOSTATIN/NYSTOP) powder Apply 1 Application topically 3 (three) times daily. PA Case: [id number] Approved 06/30/2019- 06/28/2020 60 g 2   nystatin cream (MYCOSTATIN) Apply 1 Application topically 2 (two) times daily. To affected areas 60 g 2   oxyCODONE-acetaminophen (PERCOCET) 10-325 MG tablet Take 1 tablet by mouth 4 (four) times daily as needed for  severe pain.     senna (SENOKOT) 8.6 MG TABS tablet Take 2 tablets by mouth daily as needed for mild constipation.     Vitamin D, Ergocalciferol, (DRISDOL) 1.25 MG (50000 UNIT) CAPS capsule TAKE ONE TABLET BY MOUTH on sundays at bedtime 12 capsule 1   No current facility-administered medications for this visit.    REVIEW OF SYSTEMS:    10 Point review of Systems was done is negative except as noted above.   PHYSICAL EXAMINATION:  .BP (!) 174/69 (BP Location: Left Arm, Patient Position: Sitting) Comment: Nurse notified  Pulse 76   Temp 98.1 F (36.7 C) (Temporal)   Resp 18   Wt 213 lb 1.6 oz (96.7 kg)   SpO2 93%   BMI 32.40 kg/m   GENERAL:alert, in no acute distress and comfortable SKIN: no acute rashes, no significant lesions EYES: conjunctiva are pink and non-injected, sclera anicteric OROPHARYNX: MMM, no exudates, no oropharyngeal erythema or ulceration NECK: supple, no JVD LYMPH:  no palpable lymphadenopathy in the cervical, axillary or inguinal regions LUNGS: clear to auscultation b/l with normal respiratory effort HEART: regular rate & rhythm ABDOMEN:  normoactive bowel sounds , non tender, not distended. Extremity: no pedal edema PSYCH: alert & oriented x 3 with fluent speech NEURO: no focal motor/sensory deficits   LABORATORY DATA:  I have reviewed the data as listed  .    Latest Ref Rng & Units 11/10/2022   11:34 AM 08/18/2022    8:18 AM 05/19/2022    8:39 AM  CBC  WBC 4.0 - 10.5 K/uL 9.3  15.7  12.8   Hemoglobin 12.0 - 15.0 g/dL 54.0  98.1  19.1   Hematocrit 36.0 - 46.0 % 46.9  49.7  47.6   Platelets 150 - 400 K/uL 303  404  261    . CBC    Component Value Date/Time   WBC 9.3 11/10/2022 1134   WBC 20.6 (H) 10/29/2021 0934   RBC 5.07 11/10/2022 1134   HGB 14.9 11/10/2022 1134   HCT 46.9 (H) 11/10/2022 1134   PLT 303 11/10/2022 1134   MCV 92.5 11/10/2022 1134   MCH 29.4 11/10/2022 1134   MCHC 31.8 11/10/2022 1134   RDW 18.9 (H) 11/10/2022 1134    LYMPHSABS 1.1 11/10/2022 1134   MONOABS 0.3 11/10/2022 1134   EOSABS 0.2 11/10/2022 1134   BASOSABS 0.1 11/10/2022 1134     .    Latest Ref Rng & Units 11/10/2022   11:34 AM 08/18/2022    8:18 AM 05/19/2022    8:39 AM  CMP  Glucose 70 - 99 mg/dL 478  295  621   BUN 8 - 23 mg/dL 26  30  18    Creatinine 0.44 - 1.00 mg/dL 3.08  6.57  8.46   Sodium 135 - 145 mmol/L 139  136  138   Potassium 3.5 - 5.1 mmol/L 4.2  4.5  4.2   Chloride 98 - 111 mmol/L 103  99  104   CO2 22 - 32 mmol/L 29  31  27    Calcium 8.9 - 10.3 mg/dL 9.4  96.2  9.6  Total Protein 6.5 - 8.1 g/dL 7.1  7.5  7.2   Total Bilirubin 0.3 - 1.2 mg/dL 0.3  0.3  0.4   Alkaline Phos 38 - 126 U/L 89  110  90   AST 15 - 41 U/L 12  14  12    ALT 0 - 44 U/L 11  12  11     08/15/12 JAK2 Mutation:     03/15/18 BM Bx:      RADIOGRAPHIC STUDIES: I have personally reviewed the radiological images as listed and agreed with the findings in the report. No results found.  ASSESSMENT & PLAN:   62 y.o. female with  1. Jak2 V617F mutation positive Myeloproliferative syndrome.- BM Bx consistent with Polycythemia Vera  JAK2 mutation identified in 08/15/12 labs  03/15/18 BM Bx revealed results consistent with JAK2 positive polycythemia vera  Labs done today show hemoglobin of 11.8, WBC count of 21.2k and platelets of 1k  2. H/o Medication/followup Non compliance  3. COPD - O2 dependant. Still smoking 1/2ppd  4. History of Metastatic melanoma to the right lung  S/p resection by Dr. Tyrone Sage on 10/21/12, BRAF mutation not detected. -continue q63monthly skin screening her PCP/dermatologist -symptom directed radiologic scanning.   5.  Past Medical History:  Diagnosis Date   Agoraphobia    Anxiety    Arthritis    "all joints"   Chronic low back pain    Chronic respiratory failure with hypoxia ([name])    4L  via [place],  followed by pcp,   (09-16-2018  per pt only uses while at home and at night ,  portable oxygen is not working)    Clotting disorder ([name]) 2013   Polycythemia vera   Colon polyps    COPD (chronic obstructive pulmonary disease) ([name])    DDD (degenerative disc disease), lumbosacral    Dental caries    DM type 2 (diabetes mellitus, type 2) ([name])    followed by pcp   Fibromyalgia    GERD (gastroesophageal reflux disease)    occasional , no meds   Heart murmur 2020   History of basal cell carcinoma (BCC) excision    s/p  Moh's of face/ nose 12/ 2013   History of palpitations    per pt found to have occaional PVCs   History of UTI    HTN (hypertension)    Hyperlipidemia    Loose, teeth    Major depression    Metastatic melanoma ([name]) 10/18/2012   12 mm posterior right upper lobe pulmonary nodule, max SUV 3.0  s/p  right wedge resection 10-21-2012,  followed by oncologist-- dr Candise Che,  no recurrence   Mixed stress and urge urinary incontinence    Myeloproliferative neoplasm ([name])    Narcolepsy    Obesity    OSA (obstructive sleep apnea)    Oxygen deficiency    Oxygen dependent    4 L via [place],  per pt portable oxygen not working but does use while at home and at night   Panic attacks    Periodontitis    Peripheral neuropathy    feet   Polycythemia vera(238.4) hematology/ oncology-- dr Candise Che ([facility])   first dx 02014 due to smoker/copd--  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)  hx phlebotomies   Pulmonary nodules    bilateral small stable per CT 03-11-2018   Scoliosis    Sleep apnea    Smokers' cough ([name])    per pt productive a little in the morning's   Symptoms  of upper respiratory infection (URI) 03/12/2018   Urinary (tract) obstruction    Wears glasses    PLAN: -Discussed lab results from today, 11/10/2022, with the patient. CBC shows slightly elevated hematocrit at 46.9%, stable overall with platelets of 303k and wbc count of 9.3k. CMP is stable -Hold Phlebotomy today. Allowing for permission polycythemia with HCT upto 48 given severe COPD -Schedule  patient in 3 months for phlebotomy if needed.  -Recommended smoking cessation and advised patient to limit smoking as much as she can -She has no reported toxicities from her current dose of hydroxyurea at 1000 mg p.o. daily. -Continue the hydroxyurea as prescribed without any dose modification.   FOLLOW UP: RTC with Dr Candise Che with labs and appointment for therapeutic phlebotomy in 3 months CT chest wo contrast in 10 weeks   The total time spent in the appointment was 21 minutes* .  All of the patient's questions were answered with apparent satisfaction. The patient knows to call the clinic with any problems, questions or concerns.   Wyvonnia Lora MD MS AAHIVMS [facility] Hematology/Oncology Physician [facility]  .*Total Encounter Time as defined by the Centers for Medicare and Medicaid Services includes, in addition to the face-to-face time of a patient visit (documented in the note above) non-face-to-face time: obtaining and reviewing outside history, ordering and reviewing medications, tests or procedures, care coordination (communications with other health care professionals or caregivers) and documentation in the medical record.   I, Ok Edwards, am acting as a Neurosurgeon for Wyvonnia Lora, MD. .I have reviewed the above documentation for accuracy and completeness, and I agree with the above. [name] MD

## 2022-11-10 NOTE — Progress Notes (Signed)
Patient seen by Dr. Addison Naegeli are within treatment parameters.  Labs reviewed: and are within treatment parameters.  Per physician team, patient will not receive phlebotomy today

## 2022-11-11 DIAGNOSIS — R030 Elevated blood-pressure reading, without diagnosis of hypertension: Secondary | ICD-10-CM | POA: Diagnosis not present

## 2022-11-11 DIAGNOSIS — F17200 Nicotine dependence, unspecified, uncomplicated: Secondary | ICD-10-CM | POA: Diagnosis not present

## 2022-11-11 DIAGNOSIS — G894 Chronic pain syndrome: Secondary | ICD-10-CM | POA: Diagnosis not present

## 2022-11-11 DIAGNOSIS — E119 Type 2 diabetes mellitus without complications: Secondary | ICD-10-CM | POA: Diagnosis not present

## 2022-11-11 DIAGNOSIS — Z6832 Body mass index (BMI) 32.0-32.9, adult: Secondary | ICD-10-CM | POA: Diagnosis not present

## 2022-11-11 DIAGNOSIS — F17210 Nicotine dependence, cigarettes, uncomplicated: Secondary | ICD-10-CM | POA: Diagnosis not present

## 2022-11-11 DIAGNOSIS — Z79899 Other long term (current) drug therapy: Secondary | ICD-10-CM | POA: Diagnosis not present

## 2022-11-11 DIAGNOSIS — R079 Chest pain, unspecified: Secondary | ICD-10-CM | POA: Diagnosis not present

## 2022-11-11 DIAGNOSIS — M5030 Other cervical disc degeneration, unspecified cervical region: Secondary | ICD-10-CM | POA: Diagnosis not present

## 2022-11-11 DIAGNOSIS — M5136 Other intervertebral disc degeneration, lumbar region: Secondary | ICD-10-CM | POA: Diagnosis not present

## 2022-11-16 ENCOUNTER — Encounter: Payer: Self-pay | Admitting: Hematology

## 2022-11-16 DIAGNOSIS — Z79899 Other long term (current) drug therapy: Secondary | ICD-10-CM | POA: Diagnosis not present

## 2022-11-17 ENCOUNTER — Encounter: Payer: Self-pay | Admitting: Family Medicine

## 2022-11-17 ENCOUNTER — Ambulatory Visit (INDEPENDENT_AMBULATORY_CARE_PROVIDER_SITE_OTHER): Payer: Medicare HMO | Admitting: Family Medicine

## 2022-11-17 VITALS — BP 119/77 | HR 105 | Temp 98.7°F | Resp 18 | Ht 68.0 in | Wt 210.4 lb

## 2022-11-17 DIAGNOSIS — J9611 Chronic respiratory failure with hypoxia: Secondary | ICD-10-CM | POA: Diagnosis not present

## 2022-11-17 DIAGNOSIS — M797 Fibromyalgia: Secondary | ICD-10-CM

## 2022-11-17 DIAGNOSIS — Z0000 Encounter for general adult medical examination without abnormal findings: Secondary | ICD-10-CM | POA: Diagnosis not present

## 2022-11-17 DIAGNOSIS — F4310 Post-traumatic stress disorder, unspecified: Secondary | ICD-10-CM

## 2022-11-17 DIAGNOSIS — C946 Myelodysplastic disease, not classified: Secondary | ICD-10-CM | POA: Diagnosis not present

## 2022-11-17 DIAGNOSIS — E559 Vitamin D deficiency, unspecified: Secondary | ICD-10-CM | POA: Diagnosis not present

## 2022-11-17 DIAGNOSIS — E782 Mixed hyperlipidemia: Secondary | ICD-10-CM

## 2022-11-17 DIAGNOSIS — G894 Chronic pain syndrome: Secondary | ICD-10-CM

## 2022-11-17 DIAGNOSIS — D471 Chronic myeloproliferative disease: Secondary | ICD-10-CM

## 2022-11-17 DIAGNOSIS — I10 Essential (primary) hypertension: Secondary | ICD-10-CM | POA: Diagnosis not present

## 2022-11-17 DIAGNOSIS — Z85820 Personal history of malignant melanoma of skin: Secondary | ICD-10-CM

## 2022-11-17 DIAGNOSIS — E1140 Type 2 diabetes mellitus with diabetic neuropathy, unspecified: Secondary | ICD-10-CM | POA: Diagnosis not present

## 2022-11-17 DIAGNOSIS — D45 Polycythemia vera: Secondary | ICD-10-CM

## 2022-11-17 DIAGNOSIS — Z7984 Long term (current) use of oral hypoglycemic drugs: Secondary | ICD-10-CM

## 2022-11-17 DIAGNOSIS — F17200 Nicotine dependence, unspecified, uncomplicated: Secondary | ICD-10-CM

## 2022-11-17 LAB — LIPID PANEL
Cholesterol: 180 mg/dL (ref 0–200)
HDL: 39 mg/dL — ABNORMAL LOW (ref >39.00)
NonHDL: 141.15
Total CHOL/HDL Ratio: 5
Triglycerides: 245 mg/dL — ABNORMAL HIGH (ref 0.0–149.0)
VLDL: 49 mg/dL — ABNORMAL HIGH (ref 0.0–40.0)

## 2022-11-17 LAB — URINALYSIS, ROUTINE W REFLEX MICROSCOPIC
Bilirubin Urine: NEGATIVE
Hgb urine dipstick: NEGATIVE
Ketones, ur: NEGATIVE
Leukocytes,Ua: NEGATIVE
Nitrite: NEGATIVE
Specific Gravity, Urine: 1.025 (ref 1.000–1.030)
Total Protein, Urine: NEGATIVE
Urine Glucose: NEGATIVE
Urobilinogen, UA: 0.2 (ref 0.0–1.0)
pH: 6 (ref 5.0–8.0)

## 2022-11-17 LAB — VITAMIN D 25 HYDROXY (VIT D DEFICIENCY, FRACTURES): VITD: 36.56 ng/mL (ref 30.00–100.00)

## 2022-11-17 LAB — HEMOGLOBIN A1C: Hgb A1c MFr Bld: 5.8 % (ref 4.6–6.5)

## 2022-11-17 LAB — TSH: TSH: 2.51 u[IU]/mL (ref 0.35–5.50)

## 2022-11-17 LAB — MICROALBUMIN / CREATININE URINE RATIO
Creatinine,U: 78.2 mg/dL
Microalb Creat Ratio: 10.2 mg/g (ref 0.0–30.0)
Microalb, Ur: 8 mg/dL — ABNORMAL HIGH (ref 0.0–1.9)

## 2022-11-17 LAB — VITAMIN B12: Vitamin B-12: 350 pg/mL (ref 211–911)

## 2022-11-17 LAB — LDL CHOLESTEROL, DIRECT: Direct LDL: 116 mg/dL

## 2022-11-17 MED ORDER — ASPIRIN 81 MG PO TBEC
81.0000 mg | DELAYED_RELEASE_TABLET | Freq: Every day | ORAL | 4 refills | Status: AC
Start: 2022-11-17 — End: ?

## 2022-11-17 MED ORDER — ATORVASTATIN CALCIUM 80 MG PO TABS: ORAL_TABLET | ORAL | 1 refills | Status: DC

## 2022-11-17 MED ORDER — NYSTATIN 100000 UNIT/GM EX CREA: 1.0000 | TOPICAL_CREAM | Freq: Two times a day (BID) | CUTANEOUS | 2 refills | Status: DC

## 2022-11-17 MED ORDER — GABAPENTIN 100 MG PO CAPS: 300.0000 mg | ORAL_CAPSULE | Freq: Three times a day (TID) | ORAL | 1 refills | Status: DC | PRN

## 2022-11-17 MED ORDER — METFORMIN HCL 1000 MG PO TABS
ORAL_TABLET | ORAL | 1 refills | Status: DC
Start: 2022-11-17 — End: 2023-03-04

## 2022-11-17 MED ORDER — DULOXETINE HCL 60 MG PO CPEP
ORAL_CAPSULE | ORAL | 1 refills | Status: DC
Start: 2022-11-17 — End: 2023-03-04

## 2022-11-17 MED ORDER — HYDROCHLOROTHIAZIDE 25 MG PO TABS: 25.0000 mg | ORAL_TABLET | Freq: Every morning | ORAL | 1 refills | Status: DC

## 2022-11-17 MED ORDER — AMLODIPINE BESYLATE 5 MG PO TABS: ORAL_TABLET | ORAL | 1 refills | Status: DC

## 2022-11-17 MED ORDER — OMEGA-3 FATTY ACIDS 1000 MG PO CAPS
2.0000 g | ORAL_CAPSULE | Freq: Two times a day (BID) | ORAL | 1 refills | Status: AC
Start: 2022-11-17 — End: ?

## 2022-11-17 MED ORDER — LISINOPRIL 5 MG PO TABS
ORAL_TABLET | ORAL | 1 refills | Status: DC
Start: 2022-11-17 — End: 2023-03-04

## 2022-11-17 MED ORDER — BUPROPION HCL ER (SR) 100 MG PO TB12: ORAL_TABLET | ORAL | 1 refills | Status: DC

## 2022-11-17 MED ORDER — FENOFIBRATE 145 MG PO TABS: 145.0000 mg | ORAL_TABLET | Freq: Every day | ORAL | 1 refills | Status: DC

## 2022-11-17 NOTE — Assessment & Plan Note (Signed)
Chronic.  Intermittent control.  Has a lot of other problems.  Continue gabapentin.  Also takes Flexeril/Percocet per pain management

## 2022-11-17 NOTE — Assessment & Plan Note (Signed)
Chronic.  Managed by pain management.  Not well-controlled.  Not a surgical candidate.  Continue Percocet

## 2022-11-17 NOTE — Assessment & Plan Note (Signed)
Chronic.  Question controlled.  Continue 50,000 IUs/week.  Check vitamin D

## 2022-11-17 NOTE — Progress Notes (Signed)
Phone [phone]   Subjective:   Patient is a 62 y.o. female presenting for annual physical.    Chief Complaint  Patient presents with   Medical Management of Chronic Issues    6 month follow-up on dm, htn and wellness exam Haven't had any food since 1 am    Annual DM type 2-metformin 1000mg  twice daily-taking medications regularly now. Occasional checks sugars.  <150.  HTN-Pt is on amlodipine 5 mg, hydrochloroTHIAZIDE 25 mg, zestril 5mg  .  Bp's running ok at pain clinic  No ha/dizziness/cp/palp/edema/cough  HLD-atorvastatin 80 mg, tricor 145 mg daily Vitamin D-on 50000 weekly Depression-Wellbutrin and Cymbalta-no SI.  +agoraphobia.  Good and bad days.  Chronic hypoxia-status post lung resection for melanoma.  Also COPD-continues to smoke Chronic pain neck-pain clinic-taking percocet 4/D.  Needs 5/D but only gets 4.  Has had scans, etc for neck.  Medications not help.  Gabapentin and flexeril as well. Hips hurt at times as well. Has fibromyalgia as well-arms achey and hard to do hair, etc.  Colonoscopy-needs hospital for colonoscopy, etc due to comorbidities.  Willing to go to [facility]  See problem oriented charting- ROS- ROS: Gen: no fever, chills  Skin: no rash, itching ENT: no ear pain, ear drainage, +some seasonal allergies. Eyes: no blurry vision, double vision Resp: stable CV: no CP, palpitations, LE edema,  GI: no heartburn, n/v/d/c, abd pain GU: no dysuria, urgency, frequency, hematuria MSK: HPI Neuro: no dizziness,weakness, vertigo.  + headache(s) from neck. Psych:HPI    The following were reviewed and entered/updated in epic: Past Medical History:  Diagnosis Date   Agoraphobia    Anxiety    Arthritis    "all joints"   Chronic low back pain    Chronic respiratory failure with hypoxia ([name])    4L  via [place],  followed by pcp,   (09-16-2018  per pt only uses while at home and at night ,  portable oxygen is not working)   Clotting disorder ([name]) 2013    Polycythemia vera   Colon polyps    COPD (chronic obstructive pulmonary disease) ([name])    DDD (degenerative disc disease), lumbosacral    Dental caries    DM type 2 (diabetes mellitus, type 2) ([name])    followed by pcp   Fibromyalgia    GERD (gastroesophageal reflux disease)    occasional , no meds   Heart murmur 2020   History of basal cell carcinoma (BCC) excision    s/p  Moh's of face/ nose 12/ 2013   History of palpitations    per pt found to have occaional PVCs   History of UTI    HTN (hypertension)    Hyperlipidemia    Loose, teeth    Major depression    Metastatic melanoma ([name]) 10/18/2012   12 mm posterior right upper lobe pulmonary nodule, max SUV 3.0  s/p  right wedge resection 10-21-2012,  followed by oncologist-- dr Candise Che,  no recurrence   Mixed stress and urge urinary incontinence    Myeloproliferative neoplasm ([name])    Narcolepsy    Obesity    OSA (obstructive sleep apnea)    Oxygen deficiency    Oxygen dependent    4 L via [place],  per pt portable oxygen not working but does use while at home and at night   Panic attacks    Periodontitis    Peripheral neuropathy    feet   Polycythemia vera(238.4) hematology/ oncology-- dr Candise Che ([facility])   first  dx 02014 due to smoker/copd--  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)  hx phlebotomies   Pulmonary nodules    bilateral small stable per CT 03-11-2018   Scoliosis    Sleep apnea    Smokers' cough ([name])    per pt productive a little in the morning's   Symptoms of upper respiratory infection (URI) 03/12/2018   Urinary (tract) obstruction    Wears glasses    Patient Active Problem List   Diagnosis Date Noted   Vitamin D deficiency 05/18/2022   MPN (myeloproliferative neoplasm) ([name])    Leukocytosis 03/12/2018   Dental caries 03/12/2018   Aortic atherosclerosis ([name]) 02/21/2018   Constipation 05/28/2015   OE (otitis externa) 01/04/2015   Seborrheic keratoses 08/24/2014   PVC (premature  ventricular contraction) 05/18/2014   Type 2 diabetes mellitus with diabetic neuropathy ([name]) 02/06/2013   Chronic hypoxemic respiratory failure ([name]) 02/06/2013   History of basal cell carcinoma 12/01/2012   Posttraumatic stress disorder 12/01/2012   History of malignant melanoma 10/18/2012   Polycythemia vera ([name]) 08/05/2012   Hyperlipidemia 04/09/2012   Narcolepsy 04/09/2012   Peripheral neuropathy 04/09/2012   Tobacco use disorder 04/09/2012   Obesity 04/09/2012   Essential hypertension, benign 04/09/2012   DDD (degenerative disc disease), lumbar 02/16/2012   DDD (degenerative disc disease), cervical 02/16/2012   Chronic pain disorder 02/16/2012   Depression 02/16/2012   Fibromyalgia 02/16/2012   Past Surgical History:  Procedure Laterality Date   ABDOMINAL HYSTERECTOMY  1998   partial   BREAST LUMPECTOMY Bilateral 1995   benign per pt   COLONOSCOPY W/ BIOPSIES AND POLYPECTOMY     Hx: of   CYSTECTOMY  2000   abdominal wall    DILATION AND CURETTAGE OF UTERUS  yrs ago   MOHS SURGERY  2013   nose/face   MULTIPLE EXTRACTIONS WITH ALVEOLOPLASTY N/A 09/19/2018   Procedure: Extraction of tooth #'s 2, 3, 6-14, 18, and 20-30 with alveoloplasty;  Surgeon: Charlynne Pander, DDS;  Location: [facility];  Service: Oral Surgery;  Laterality: N/A;  GENERAL WITH NASAL TUBE   VIDEO ASSISTED THORACOSCOPY (VATS)/WEDGE RESECTION Right 10/21/2012   Procedure: VIDEO ASSISTED THORACOSCOPY (VATS)/WEDGE RESECTION;  Surgeon: Delight Ovens, MD;  Location: [facility] OR;  Service: Thoracic;  Laterality: Right;   VIDEO BRONCHOSCOPY N/A 10/21/2012   Procedure: VIDEO BRONCHOSCOPY;  Surgeon: Delight Ovens, MD;  Location: [facility] OR;  Service: Thoracic;  Laterality: N/A;    Family History  Problem Relation Age of Onset   Arthritis Mother    Hyperlipidemia Mother    Depression Mother    Anxiety disorder Mother    Dementia Mother    Hypertension Father    Hyperlipidemia Father    Heart disease Father     Stroke Father    Dementia Father    Heart disease Brother    ADD / ADHD Son    Alcohol abuse Maternal Grandfather    Bipolar disorder Neg Hx    Drug abuse Neg Hx    OCD Neg Hx    Paranoid behavior Neg Hx    Schizophrenia Neg Hx    Seizures Neg Hx    Sexual abuse Neg Hx    Physical abuse Neg Hx     Medications- reviewed and updated Current Outpatient Medications  Medication Sig Dispense Refill   aspirin EC 81 MG tablet Take 1 tablet (81 mg total) by mouth daily. 90 tablet 1   aspirin EC 81 MG tablet Take 1 tablet (81 mg total)  by mouth daily. Swallow whole. 100 tablet 4   Blood Glucose Monitoring Suppl (BLOOD GLUCOSE SYSTEM PAK) KIT Please dispense based on patient and insurance preference. Use as directed to monitor FSBS 2x daily. Dx: E11.9 1 kit 1   cyclobenzaprine (FLEXERIL) 5 MG tablet Take 5 mg by mouth 3 (three) times daily as needed.     glucose blood (ACCU-CHEK GUIDE) test strip Use as instructed 100 each 12   Glucose Blood (BLOOD GLUCOSE TEST STRIPS) STRP Please dispense based on patient and insurance preference. Use as directed to monitor FSBS 2x daily. Dx: E11.9 100 strip 1   hydroxyurea (HYDREA) 500 MG capsule TAKE TWO CAPSULES BY MOUTH EVERYDAY AT BEDTIME 90 capsule 3   Microlet Lancets MISC Please dispense based on patient and insurance preference. Use as directed to monitor FSBS 2x daily. Dx: E11.9 100 each 11   naloxone (NARCAN) nasal spray 4 mg/0.1 mL SMARTSIG:1 Spray(s) Both Nares PRN     nystatin (MYCOSTATIN/NYSTOP) powder Apply 1 Application topically 3 (three) times daily. PA Case: [id number] Approved 06/30/2019- 06/28/2020 60 g 2   oxyCODONE-acetaminophen (PERCOCET) 10-325 MG tablet Take 1 tablet by mouth 4 (four) times daily as needed for severe pain.     senna (SENOKOT) 8.6 MG TABS tablet Take 2 tablets by mouth daily as needed for mild constipation.     Vitamin D, Ergocalciferol, (DRISDOL) 1.25 MG (50000 UNIT) CAPS capsule TAKE ONE TABLET BY MOUTH on sundays at  bedtime 12 capsule 1   amLODipine (NORVASC) 5 MG tablet TAKE ONE TABLET BY MOUTH EVERYDAY AT BEDTIME 90 tablet 1   atorvastatin (LIPITOR) 80 MG tablet TAKE ONE TABLET BY MOUTH EVERYDAY AT BEDTIME 90 tablet 1   buPROPion ER (WELLBUTRIN SR) 100 MG 12 hr tablet TAKE ONE TABLET BY MOUTH EVERY MORNING and TAKE ONE TABLET BY MOUTH EVERYDAY AT BEDTIME 180 tablet 1   DULoxetine (CYMBALTA) 60 MG capsule 1 tab po bid 180 capsule 1   fenofibrate (TRICOR) 145 MG tablet Take 1 tablet (145 mg total) by mouth daily. 90 tablet 1   fish oil-omega-3 fatty acids 1000 MG capsule Take 2 capsules (2 g total) by mouth 2 (two) times daily. 180 capsule 1   gabapentin (NEURONTIN) 100 MG capsule Take 3 capsules (300 mg total) by mouth 3 (three) times daily as needed. 270 capsule 1   hydrochlorothiazide (HYDRODIURIL) 25 MG tablet Take 1 tablet (25 mg total) by mouth every morning. 90 tablet 1   lisinopril (ZESTRIL) 5 MG tablet TAKE ONE TABLET BY MOUTH EVERYDAY AT BEDTIME 90 tablet 1   metFORMIN (GLUCOPHAGE) 1000 MG tablet TAKE ONE TABLET BY MOUTH EVERY MORNING and TAKE ONE TABLET BY MOUTH EVERYDAY AT BEDTIME 180 tablet 1   nystatin cream (MYCOSTATIN) Apply 1 Application topically 2 (two) times daily. To affected areas 60 g 2   No current facility-administered medications for this visit.    Allergies-reviewed and updated Allergies  Allergen Reactions   Contrast Media [Iodinated Contrast Media] Anaphylaxis   Erythromycin     Stomach pain   Flagyl [Metronidazole] Other (See Comments)    Generalized pain.   Latex Other (See Comments)    Was told to be careful because of Dye allergy   Other     Iodine Dye   Penicillins Other (See Comments)    Patient was an infant, no idea of reaction. Tolerates Keflex. Did it involve swelling of the face/tongue/throat, SOB, or low BP? Unknown Did it involve sudden or severe rash/hives, skin peeling,  or any reaction on the inside of your mouth or nose? Unknown Did you need to seek  medical attention at a hospital or doctor's office? Unknown When did it last happen?      infant If all above answers are "NO", may proceed with cephalosporin use.    Tetracyclines & Related Other (See Comments)    GI side effects    Social History   Social History Narrative   Son-lives w/son.   disabled   Objective  Objective:  BP 119/77   Pulse (!) 105   Temp 98.7 F (37.1 C) (Temporal)   Resp 18   Ht 5\' 8"  (1.727 m)   Wt 210 lb 6 oz (95.4 kg)   SpO2 96% Comment: 2lo2  BMI 31.99 kg/m  Physical Exam  Gen: [facility] NAD-on O2 HEENT: NCAT, conjunctiva not injected, sclera nonicteric TM WNL B, OP moist, no exudates  NECK:  supple, no thyromegaly, no nodes, no carotid bruits CARDIAC: tachy RRR, S1S2+, no murmur. DP 2+B LUNGS: CTAB. No wheezes ABDOMEN:  BS+, soft, NTND, No HSM, no masses EXT:  no edema MSK: cane MS 5/5 all 4 NEURO: A&O x3.  CN II-XII intact.  PSYCH: normal mood. Good eye contact   Reviewed labs from onc    Assessment and Plan   Health Maintenance counseling: 1. Anticipatory guidance: Patient counseled regarding regular dental exams q6 months, eye exams,  avoiding smoking and second hand smoke, limiting alcohol to 1 beverage per day, no illicit drugs.   2. Risk factor reduction:  Advised patient of need for regular exercise and diet rich and fruits and vegetables to reduce risk of heart attack and stroke. Exercise- hard to due to lungs.  Wt Readings from Last 3 Encounters:  11/17/22 210 lb 6 oz (95.4 kg)  11/10/22 213 lb 1.6 oz (96.7 kg)  08/18/22 215 lb 11.2 oz (97.8 kg)   3. Immunizations/screenings/ancillary studies Immunization History  Administered Date(s) Administered   Hepatitis B, ADULT 06/29/2001   Hepatitis B, PED/ADOLESCENT 06/29/2001   Influenza,inj,Quad PF,6+ Mos 04/07/2013, 05/18/2014, 05/28/2015, 05/13/2016, 06/11/2017, 07/18/2018, 06/16/2019, 06/18/2020, 05/18/2022   Moderna Sars-Covid-2 Vaccination 11/20/2019, 12/19/2019    Pneumococcal Polysaccharide-23 12/10/2011, 10/25/2013   Td 06/29/2001   Td (Adult), 2 Lf Tetanus Toxid, Preservative Free 06/29/2001   Tdap 12/10/2011   Zoster Recombinat (Shingrix) 11/14/2021   Health Maintenance Due  Topic Date Due   COLONOSCOPY (Pts 45-35yrs Insurance coverage will need to be confirmed)  Never done   OPHTHALMOLOGY EXAM  09/07/2018   MAMMOGRAM  04/21/2019   DTaP/Tdap/Td (4 - Td or Tdap) 12/09/2021   Medicare Annual Wellness (AWV)  12/27/2022    4. Cervical cancer screening- n/a 5. Breast cancer screening-  mammogram patient will sch 6. Colon cancer screening - working on it 7. Skin cancer screening- advised regular sunscreen use. Denies worrisome, changing, or new skin lesions.  8. Birth control/STD check- n/a 9. Osteoporosis screening- n/a 10. Smoking associated screening - + smoker  Wellness examination  Type 2 diabetes mellitus with diabetic neuropathy, without long-term current use of insulin ([name]) Assessment & Plan: Chronic.  Controlled.  Continue metformin 1000 mg twice daily.  Advised to get eye exam scheduled.  Orders: -     Hemoglobin A1c -     Microalbumin / creatinine urine ratio -     TSH -     Vitamin B12 -     Urinalysis, Routine w reflex microscopic  Essential hypertension, benign Assessment & Plan: Chronic.  Controlled.  Continue  amlodipine 5 mg, HCTZ 25 mg, Zestril 5 mg.  Orders: -     Microalbumin / creatinine urine ratio -     Urinalysis, Routine w reflex microscopic  Mixed hyperlipidemia Assessment & Plan: Chronic.  Controlled.  Continue atorvastatin 80 mg, Tricor 145 mg, fish oil  Orders: -     Lipid panel -     TSH  Vitamin D deficiency Assessment & Plan: Chronic.  Question controlled.  Continue 50,000 IUs/week.  Check vitamin D  Orders: -     VITAMIN D 25 Hydroxy (Vit-D Deficiency, Fractures)  History of malignant melanoma Assessment & Plan: Chronic.  Status post resection of right upper lobe of the lung.  No  primary was ever found.   Fibromyalgia Assessment & Plan: Chronic.  Intermittent control.  Has a lot of other problems.  Continue gabapentin.  Also takes Flexeril/Percocet per pain management   Chronic hypoxemic respiratory failure ([name]) Assessment & Plan: Chronic.  Stable.  Majority is due to lung resection for pulmonary melanoma.  However, patient continues to smoke.  Continue oxygen.   Chronic pain disorder Assessment & Plan: Chronic.  Managed by pain management.  Not well-controlled.  Not a surgical candidate.  Continue Percocet   Posttraumatic stress disorder Assessment & Plan: Chronic.  Mostly controlled.  Continue Wellbutrin and Cymbalta.   Polycythemia vera ([name]) Assessment & Plan: Chronic.  Managed by oncology.  Patient is working on cutting back smoking.   Tobacco use disorder  MPN (myeloproliferative neoplasm) ([name]) Assessment & Plan: Chronic.  Managed by oncology.  Has been stable.   Long term current use of oral hypoglycemic drug  Other orders -     amLODIPine Besylate; TAKE ONE TABLET BY MOUTH EVERYDAY AT BEDTIME  Dispense: 90 tablet; Refill: 1 -     Atorvastatin Calcium; TAKE ONE TABLET BY MOUTH EVERYDAY AT BEDTIME  Dispense: 90 tablet; Refill: 1 -     buPROPion HCl ER (SR); TAKE ONE TABLET BY MOUTH EVERY MORNING and TAKE ONE TABLET BY MOUTH EVERYDAY AT BEDTIME  Dispense: 180 tablet; Refill: 1 -     DULoxetine HCl; 1 tab po bid  Dispense: 180 capsule; Refill: 1 -     Fenofibrate; Take 1 tablet (145 mg total) by mouth daily.  Dispense: 90 tablet; Refill: 1 -     Omega-3 Fatty Acids; Take 2 capsules (2 g total) by mouth 2 (two) times daily.  Dispense: 180 capsule; Refill: 1 -     Aspirin; Take 1 tablet (81 mg total) by mouth daily. Swallow whole.  Dispense: 100 tablet; Refill: 4 -     Gabapentin; Take 3 capsules (300 mg total) by mouth 3 (three) times daily as needed.  Dispense: 270 capsule; Refill: 1 -     hydroCHLOROthiazide; Take 1 tablet (25 mg total) by  mouth every morning.  Dispense: 90 tablet; Refill: 1 -     Lisinopril; TAKE ONE TABLET BY MOUTH EVERYDAY AT BEDTIME  Dispense: 90 tablet; Refill: 1 -     metFORMIN HCl; TAKE ONE TABLET BY MOUTH EVERY MORNING and TAKE ONE TABLET BY MOUTH EVERYDAY AT BEDTIME  Dispense: 180 tablet; Refill: 1 -     Nystatin; Apply 1 Application topically 2 (two) times daily. To affected areas  Dispense: 60 g; Refill: 2 -     LDL cholesterol, direct   Wellness-antic guidance.   Recommended follow up: Return in about 6 months (around 05/20/2023) for HTN, DM, cholesterol, mood.  Lab/Order associations:+ fasting  Angelena Sole,  MD

## 2022-11-17 NOTE — Assessment & Plan Note (Signed)
Chronic.  Stable.  Majority is due to lung resection for pulmonary melanoma.  However, patient continues to smoke.  Continue oxygen.

## 2022-11-17 NOTE — Assessment & Plan Note (Signed)
Chronic.  Controlled.  Continue atorvastatin 80 mg, Tricor 145 mg, fish oil

## 2022-11-17 NOTE — Patient Instructions (Addendum)
It was very nice to see you today!  Call for mammogram Call gastro and ask about [facility] Get eye exam done   PLEASE NOTE:  If you had any lab tests please let us know if you have not heard back within a few days. You may see your results on MyChart before we have a chance to review them but we will give you a call once they are reviewed by [place]. If we ordered any referrals today, please let us know if you have not heard from their office within the next week.   Please try these tips to maintain a healthy lifestyle:  Eat most of your calories during the day when you are active. Eliminate processed foods including packaged sweets (pies, cakes, cookies), reduce intake of potatoes, white bread, white pasta, and white rice. Look for whole grain options, oat flour or almond flour.  Each meal should contain half fruits/vegetables, one quarter protein, and one quarter carbs (no bigger than a computer mouse).  Cut down on sweet beverages. This includes juice, soda, and sweet tea. Also watch fruit intake, though this is a healthier sweet option, it still contains natural sugar! Limit to 3 servings daily.  Drink at least 1 glass of water with each meal and aim for at least 8 glasses per day  Exercise at least 150 minutes every week.

## 2022-11-17 NOTE — Assessment & Plan Note (Signed)
Chronic.  Status post resection of right upper lobe of the lung.  No primary was ever found.

## 2022-11-17 NOTE — Assessment & Plan Note (Signed)
Chronic.  Controlled.  Continue amlodipine 5 mg, HCTZ 25 mg, Zestril 5 mg.

## 2022-11-17 NOTE — Assessment & Plan Note (Signed)
Chronic.  Mostly controlled.  Continue Wellbutrin and Cymbalta.

## 2022-11-17 NOTE — Assessment & Plan Note (Signed)
Chronic.  Managed by oncology.  Has been stable.

## 2022-11-17 NOTE — Assessment & Plan Note (Signed)
Chronic.  Managed by oncology.  Patient is working on cutting back smoking.

## 2022-11-17 NOTE — Assessment & Plan Note (Signed)
Chronic.  Controlled.  Continue metformin 1000 mg twice daily.  Advised to get eye exam scheduled.

## 2022-11-18 ENCOUNTER — Other Ambulatory Visit: Payer: Self-pay | Admitting: *Deleted

## 2022-11-18 MED ORDER — EZETIMIBE 10 MG PO TABS: 10.0000 mg | ORAL_TABLET | Freq: Every morning | ORAL | 1 refills | Status: DC

## 2022-11-18 NOTE — Progress Notes (Signed)
Labs looking good except cholesterol.  Is she missing doses of her medications or taking all regularly?   If taking all, should add zetia 10 mg daily #90/1.  If missing doses-take them

## 2022-12-03 ENCOUNTER — Other Ambulatory Visit: Payer: Self-pay

## 2022-12-03 DIAGNOSIS — D45 Polycythemia vera: Secondary | ICD-10-CM

## 2022-12-03 DIAGNOSIS — Z85820 Personal history of malignant melanoma of skin: Secondary | ICD-10-CM

## 2022-12-03 DIAGNOSIS — R918 Other nonspecific abnormal finding of lung field: Secondary | ICD-10-CM

## 2022-12-10 DIAGNOSIS — M5136 Other intervertebral disc degeneration, lumbar region: Secondary | ICD-10-CM | POA: Diagnosis not present

## 2022-12-10 DIAGNOSIS — M5030 Other cervical disc degeneration, unspecified cervical region: Secondary | ICD-10-CM | POA: Diagnosis not present

## 2022-12-10 DIAGNOSIS — F17210 Nicotine dependence, cigarettes, uncomplicated: Secondary | ICD-10-CM | POA: Diagnosis not present

## 2022-12-10 DIAGNOSIS — E559 Vitamin D deficiency, unspecified: Secondary | ICD-10-CM | POA: Diagnosis not present

## 2022-12-10 DIAGNOSIS — R030 Elevated blood-pressure reading, without diagnosis of hypertension: Secondary | ICD-10-CM | POA: Diagnosis not present

## 2022-12-10 DIAGNOSIS — E119 Type 2 diabetes mellitus without complications: Secondary | ICD-10-CM | POA: Diagnosis not present

## 2022-12-10 DIAGNOSIS — F1120 Opioid dependence, uncomplicated: Secondary | ICD-10-CM | POA: Diagnosis not present

## 2022-12-10 DIAGNOSIS — G894 Chronic pain syndrome: Secondary | ICD-10-CM | POA: Diagnosis not present

## 2022-12-10 DIAGNOSIS — F17200 Nicotine dependence, unspecified, uncomplicated: Secondary | ICD-10-CM | POA: Diagnosis not present

## 2022-12-10 DIAGNOSIS — Z6831 Body mass index (BMI) 31.0-31.9, adult: Secondary | ICD-10-CM | POA: Diagnosis not present

## 2022-12-24 ENCOUNTER — Ambulatory Visit (INDEPENDENT_AMBULATORY_CARE_PROVIDER_SITE_OTHER): Payer: Medicare HMO

## 2022-12-24 VITALS — Wt 210.0 lb

## 2022-12-24 DIAGNOSIS — Z86010 Personal history of colonic polyps: Secondary | ICD-10-CM

## 2022-12-24 DIAGNOSIS — Z1211 Encounter for screening for malignant neoplasm of colon: Secondary | ICD-10-CM | POA: Diagnosis not present

## 2022-12-24 DIAGNOSIS — Z0000 Encounter for general adult medical examination without abnormal findings: Secondary | ICD-10-CM

## 2022-12-24 DIAGNOSIS — Z1231 Encounter for screening mammogram for malignant neoplasm of breast: Secondary | ICD-10-CM | POA: Diagnosis not present

## 2022-12-24 NOTE — Patient Instructions (Signed)
[name] , Thank you for taking time to come for your Medicare Wellness Visit. I appreciate your ongoing commitment to your health goals. Please review the following plan we discussed and let me know if I can assist you in the future.   These are the goals we discussed:  Goals      Manage My Medicine     Timeframe:  Long-Range Goal Priority:  High Start Date:  10/18/20                           Expected End Date:  04/19/21                     Follow Up Date 01/26/21   - call for medicine refill 2 or 3 days before it runs out - learn to read medicine labels - use a pillbox to sort medicine - use an alarm clock or phone to remind me to take my medicine    Why is this important?   These steps will help you keep on track with your medicines.   Notes: Will set up with [facility] for packaging and delivery.     Patient Stated     None at this time      Patient Stated     Start walking more      Track and Manage My Blood Pressure-Hypertension     Timeframe:  Long-Range Goal Priority:  High Start Date: 06/05/21                            Expected End Date:  12/04/21                     Follow Up Date 09/03/21    - check blood pressure weekly - choose a place to take my blood pressure (home, clinic or office, retail store) - write blood pressure results in a log or diary    Why is this important?   You won't feel high blood pressure, but it can still hurt your blood vessels.  High blood pressure can cause heart or kidney problems. It can also cause a stroke.  Making lifestyle changes like losing a little weight or eating less salt will help.  Checking your blood pressure at home and at different times of the day can help to control blood pressure.  If the doctor prescribes medicine remember to take it the way the doctor ordered.  Call the office if you cannot afford the medicine or if there are questions about it.     Notes:         This is a list of the screening  recommended for you and due dates:  Health Maintenance  Topic Date Due   Colon Cancer Screening  Never done   Eye exam for diabetics  09/07/2018   Mammogram  04/21/2019   DTaP/Tdap/Td vaccine (4 - Td or Tdap) 12/09/2021   Zoster (Shingles) Vaccine (2 of 2) 02/09/2023*   COVID-19 Vaccine (3 - Moderna risk series) 06/23/2023*   Flu Shot  01/28/2023   Complete foot exam   05/19/2023   Hemoglobin A1C  05/20/2023   Screening for Lung Cancer  07/21/2023   Yearly kidney function blood test for diabetes  11/10/2023   Yearly kidney health urinalysis for diabetes  11/17/2023   Medicare Annual Wellness Visit  12/24/2023   Hepatitis C Screening  Completed   HIV Screening  Completed   HPV Vaccine  Aged Out  *Topic was postponed. The date shown is not the original due date.    Advanced directives: Advance directive discussed with you today. Even though you declined this today please call our office should you change your mind and we can give you the proper paperwork for you to fill out.  Conditions/risks identified: to walk more   Next appointment: Follow up in one year for your annual wellness visit.   Preventive Care 40-64 Years, Female Preventive care refers to lifestyle choices and visits with your health care provider that can promote health and wellness. What does preventive care include? A yearly physical exam. This is also called an annual well check. Dental exams once or twice a year. Routine eye exams. Ask your health care provider how often you should have your eyes checked. Personal lifestyle choices, including: Daily care of your teeth and gums. Regular physical activity. Eating a healthy diet. Avoiding tobacco and drug use. Limiting alcohol use. Practicing safe sex. Taking low-dose aspirin daily starting at age 34. Taking vitamin and mineral supplements as recommended by your health care provider. What happens during an annual well check? The services and screenings done  by your health care provider during your annual well check will depend on your age, overall health, lifestyle risk factors, and family history of disease. Counseling  Your health care provider may ask you questions about your: Alcohol use. Tobacco use. Drug use. Emotional well-being. Home and relationship well-being. Sexual activity. Eating habits. Work and work [employer]. Method of birth control. Menstrual cycle. Pregnancy history. Screening  You may have the following tests or measurements: Height, weight, and BMI. Blood pressure. Lipid and cholesterol levels. These may be checked every 5 years, or more frequently if you are over 24 years old. Skin check. Lung cancer screening. You may have this screening every year starting at age 63 if you have a 30-pack-year history of smoking and currently smoke or have quit within the past 15 years. Fecal occult blood test (FOBT) of the stool. You may have this test every year starting at age 41. Flexible sigmoidoscopy or colonoscopy. You may have a sigmoidoscopy every 5 years or a colonoscopy every 10 years starting at age 82. Hepatitis C blood test. Hepatitis B blood test. Sexually transmitted disease (STD) testing. Diabetes screening. This is done by checking your blood sugar (glucose) after you have not eaten for a while (fasting). You may have this done every 1-3 years. Mammogram. This may be done every 1-2 years. Talk to your health care provider about when you should start having regular mammograms. This may depend on whether you have a family history of breast cancer. BRCA-related cancer screening. This may be done if you have a family history of breast, ovarian, tubal, or peritoneal cancers. Pelvic exam and Pap test. This may be done every 3 years starting at age 74. Starting at age 37, this may be done every 5 years if you have a Pap test in combination with an HPV test. Bone density scan. This is done to screen for osteoporosis. You  may have this scan if you are at high risk for osteoporosis. Discuss your test results, treatment options, and if necessary, the need for more tests with your health care provider. Vaccines  Your health care provider may recommend certain vaccines, such as: Influenza vaccine. This is recommended every year. Tetanus, diphtheria, and acellular pertussis (Tdap, Td) vaccine. You may need  a Td booster every 10 years. Zoster vaccine. You may need this after age 35. Pneumococcal 13-valent conjugate (PCV13) vaccine. You may need this if you have certain conditions and were not previously vaccinated. Pneumococcal polysaccharide (PPSV23) vaccine. You may need one or two doses if you smoke cigarettes or if you have certain conditions. Talk to your health care provider about which screenings and vaccines you need and how often you need them. This information is not intended to replace advice given to you by your health care provider. Make sure you discuss any questions you have with your health care provider. Document Released: 07/12/2015 Document Revised: 03/04/2016 Document Reviewed: 04/16/2015 Elsevier Interactive Patient Education  2017 [employer].    Fall Prevention in the Home Falls can cause injuries. They can happen to people of all ages. There are many things you can do to make your home safe and to help prevent falls. What can I do on the outside of my home? Regularly fix the edges of walkways and driveways and fix any cracks. Remove anything that might make you trip as you walk through a door, such as a raised step or threshold. Trim any bushes or trees on the path to your home. Use bright outdoor lighting. Clear any walking paths of anything that might make someone trip, such as rocks or tools. Regularly check to see if handrails are loose or broken. Make sure that both sides of any steps have handrails. Any raised decks and porches should have guardrails on the edges. Have any leaves,  snow, or ice cleared regularly. Use sand or salt on walking paths during winter. Clean up any spills in your garage right away. This includes oil or grease spills. What can I do in the bathroom? Use night lights. Install grab bars by the toilet and in the tub and shower. Do not use towel bars as grab bars. Use non-skid mats or decals in the tub or shower. If you need to sit down in the shower, use a plastic, non-slip stool. Keep the floor dry. Clean up any water that spills on the floor as soon as it happens. Remove soap buildup in the tub or shower regularly. Attach bath mats securely with double-sided non-slip rug tape. Do not have throw rugs and other things on the floor that can make you trip. What can I do in the bedroom? Use night lights. Make sure that you have a light by your bed that is easy to reach. Do not use any sheets or blankets that are too big for your bed. They should not hang down onto the floor. Have a firm chair that has side arms. You can use this for support while you get dressed. Do not have throw rugs and other things on the floor that can make you trip. What can I do in the kitchen? Clean up any spills right away. Avoid walking on wet floors. Keep items that you use a lot in easy-to-reach places. If you need to reach something above you, use a strong step stool that has a grab bar. Keep electrical cords out of the way. Do not use floor polish or wax that makes floors slippery. If you must use wax, use non-skid floor wax. Do not have throw rugs and other things on the floor that can make you trip. What can I do with my stairs? Do not leave any items on the stairs. Make sure that there are handrails on both sides of the stairs and use them.  Fix handrails that are broken or loose. Make sure that handrails are as long as the stairways. Check any carpeting to make sure that it is firmly attached to the stairs. Fix any carpet that is loose or worn. Avoid having throw  rugs at the top or bottom of the stairs. If you do have throw rugs, attach them to the floor with carpet tape. Make sure that you have a light switch at the top of the stairs and the bottom of the stairs. If you do not have them, ask someone to add them for you. What else can I do to help prevent falls? Wear shoes that: Do not have high heels. Have rubber bottoms. Are comfortable and fit you well. Are closed at the toe. Do not wear sandals. If you use a stepladder: Make sure that it is fully opened. Do not climb a closed stepladder. Make sure that both sides of the stepladder are locked into place. Ask someone to hold it for you, if possible. Clearly mark and make sure that you can see: Any grab bars or handrails. First and last steps. Where the edge of each step is. Use tools that help you move around (mobility aids) if they are needed. These include: Canes. Walkers. Scooters. Crutches. Turn on the lights when you go into a dark area. Replace any light bulbs as soon as they burn out. Set up your furniture so you have a clear path. Avoid moving your furniture around. If any of your floors are uneven, fix them. If there are any pets around you, be aware of where they are. Review your medicines with your doctor. Some medicines can make you feel dizzy. This can increase your chance of falling. Ask your doctor what other things that you can do to help prevent falls. This information is not intended to replace advice given to you by your health care provider. Make sure you discuss any questions you have with your health care provider. Document Released: 04/11/2009 Document Revised: 11/21/2015 Document Reviewed: 07/20/2014 Elsevier Interactive Patient Education  2017 [employer].

## 2022-12-24 NOTE — Progress Notes (Addendum)
Subjective:   Katelyn Cline is a 62 y.o. female who presents for Medicare Annual (Subsequent) preventive examination.  Visit Complete: Virtual  I connected with  [name] on 12/24/22 by a audio enabled telemedicine application and verified that I am speaking with the correct person using two identifiers.  Patient Location: Home  Provider Location: Office/Clinic  I discussed the limitations of evaluation and management by telemedicine. The patient expressed understanding and agreed to proceed.   Review of Systems     Cardiac Risk Factors include: advanced age (>71men, >75 women);obesity (BMI >30kg/m2);sedentary lifestyle;dyslipidemia;hypertension;smoking/ tobacco exposure;diabetes mellitus     Objective:    Today's Vitals   12/24/22 1430  Weight: 210 lb (95.3 kg)   Body mass index is 31.93 kg/m.     12/24/2022    2:36 PM 12/26/2021    3:09 PM 02/18/2021   11:16 AM 06/18/2020   10:45 AM 05/24/2020    3:09 PM 01/12/2020   10:26 AM 11/17/2019   10:28 AM  Advanced Directives  Does Patient Have a Medical Advance Directive? No No No No No No No  Would patient like information on creating a medical advance directive? No - Patient declined No - Patient declined No - Patient declined Yes (MAU/Ambulatory/Procedural Areas - Information given) No - Patient declined No - Patient declined No - Patient declined    Current Medications (verified) Outpatient Encounter Medications as of 12/24/2022  Medication Sig   amLODipine (NORVASC) 5 MG tablet TAKE ONE TABLET BY MOUTH EVERYDAY AT BEDTIME   aspirin EC 81 MG tablet Take 1 tablet (81 mg total) by mouth daily. Swallow whole.   atorvastatin (LIPITOR) 80 MG tablet TAKE ONE TABLET BY MOUTH EVERYDAY AT BEDTIME   Blood Glucose Monitoring Suppl (BLOOD GLUCOSE SYSTEM PAK) KIT Please dispense based on patient and insurance preference. Use as directed to monitor FSBS 2x daily. Dx: E11.9   buPROPion ER (WELLBUTRIN SR) 100 MG 12 hr tablet  TAKE ONE TABLET BY MOUTH EVERY MORNING and TAKE ONE TABLET BY MOUTH EVERYDAY AT BEDTIME   cyclobenzaprine (FLEXERIL) 5 MG tablet Take 5 mg by mouth 3 (three) times daily as needed.   DULoxetine (CYMBALTA) 60 MG capsule 1 tab po bid   ezetimibe (ZETIA) 10 MG tablet Take 1 tablet (10 mg total) by mouth every morning.   fenofibrate (TRICOR) 145 MG tablet Take 1 tablet (145 mg total) by mouth daily.   fish oil-omega-3 fatty acids 1000 MG capsule Take 2 capsules (2 g total) by mouth 2 (two) times daily.   gabapentin (NEURONTIN) 100 MG capsule Take 3 capsules (300 mg total) by mouth 3 (three) times daily as needed.   glucose blood (ACCU-CHEK GUIDE) test strip Use as instructed   Glucose Blood (BLOOD GLUCOSE TEST STRIPS) STRP Please dispense based on patient and insurance preference. Use as directed to monitor FSBS 2x daily. Dx: E11.9   hydrochlorothiazide (HYDRODIURIL) 25 MG tablet Take 1 tablet (25 mg total) by mouth every morning.   hydroxyurea (HYDREA) 500 MG capsule TAKE TWO CAPSULES BY MOUTH EVERYDAY AT BEDTIME   lisinopril (ZESTRIL) 5 MG tablet TAKE ONE TABLET BY MOUTH EVERYDAY AT BEDTIME   metFORMIN (GLUCOPHAGE) 1000 MG tablet TAKE ONE TABLET BY MOUTH EVERY MORNING and TAKE ONE TABLET BY MOUTH EVERYDAY AT BEDTIME   Microlet Lancets MISC Please dispense based on patient and insurance preference. Use as directed to monitor FSBS 2x daily. Dx: E11.9   nystatin (MYCOSTATIN/NYSTOP) powder Apply 1 Application topically 3 (three) times  daily. PA Case: [id number] Approved 06/30/2019- 06/28/2020   nystatin cream (MYCOSTATIN) Apply 1 Application topically 2 (two) times daily. To affected areas   oxyCODONE-acetaminophen (PERCOCET) 10-325 MG tablet Take 1 tablet by mouth 4 (four) times daily as needed for severe pain.   senna (SENOKOT) 8.6 MG TABS tablet Take 2 tablets by mouth daily as needed for mild constipation.   Vitamin D, Ergocalciferol, (DRISDOL) 1.25 MG (50000 UNIT) CAPS capsule TAKE ONE TABLET BY MOUTH  on sundays at bedtime   naloxone (NARCAN) nasal spray 4 mg/0.1 mL SMARTSIG:1 Spray(s) Both Nares PRN (Patient not taking: Reported on 12/24/2022)   [DISCONTINUED] aspirin EC 81 MG tablet Take 1 tablet (81 mg total) by mouth daily.   No facility-administered encounter medications on file as of 12/24/2022.    Allergies (verified) Contrast media [iodinated contrast media], Erythromycin, Flagyl [metronidazole], Latex, Other, Penicillins, and Tetracyclines & related   History: Past Medical History:  Diagnosis Date   Agoraphobia    Anxiety    Arthritis    "all joints"   Chronic low back pain    Chronic respiratory failure with hypoxia ([name])    4L  via [place],  followed by pcp,   (09-16-2018  per pt only uses while at home and at night ,  portable oxygen is not working)   Clotting disorder ([name]) 2013   Polycythemia vera   Colon polyps    COPD (chronic obstructive pulmonary disease) ([name])    DDD (degenerative disc disease), lumbosacral    Dental caries    DM type 2 (diabetes mellitus, type 2) ([name])    followed by pcp   Fibromyalgia    GERD (gastroesophageal reflux disease)    occasional , no meds   Heart murmur 2020   History of basal cell carcinoma (BCC) excision    s/p  Moh's of face/ nose 12/ 2013   History of palpitations    per pt found to have occaional PVCs   History of UTI    HTN (hypertension)    Hyperlipidemia    Loose, teeth    Major depression    Metastatic melanoma ([name]) 10/18/2012   12 mm posterior right upper lobe pulmonary nodule, max SUV 3.0  s/p  right wedge resection 10-21-2012,  followed by oncologist-- dr Candise Che,  no recurrence   Mixed stress and urge urinary incontinence    Myeloproliferative neoplasm ([name])    Narcolepsy    Obesity    OSA (obstructive sleep apnea)    Oxygen deficiency    Oxygen dependent    4 L via [place],  per pt portable oxygen not working but does use while at home and at night   Panic attacks    Periodontitis    Peripheral neuropathy     feet   Polycythemia vera(238.4) hematology/ oncology-- dr Candise Che ([facility])   first dx 02014 due to smoker/copd--  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)  hx phlebotomies   Pulmonary nodules    bilateral small stable per CT 03-11-2018   Scoliosis    Sleep apnea    Smokers' cough ([name])    per pt productive a little in the morning's   Symptoms of upper respiratory infection (URI) 03/12/2018   Urinary (tract) obstruction    Wears glasses    Past Surgical History:  Procedure Laterality Date   ABDOMINAL HYSTERECTOMY  1998   partial   BREAST LUMPECTOMY Bilateral 1995   benign per pt   COLONOSCOPY W/ BIOPSIES AND POLYPECTOMY  Hx: of   CYSTECTOMY  2000   abdominal wall    DILATION AND CURETTAGE OF UTERUS  yrs ago   MOHS SURGERY  2013   nose/face   MULTIPLE EXTRACTIONS WITH ALVEOLOPLASTY N/A 09/19/2018   Procedure: Extraction of tooth #'s 2, 3, 6-14, 18, and 20-30 with alveoloplasty;  Surgeon: Charlynne Pander, DDS;  Location: [facility];  Service: Oral Surgery;  Laterality: N/A;  GENERAL WITH NASAL TUBE   VIDEO ASSISTED THORACOSCOPY (VATS)/WEDGE RESECTION Right 10/21/2012   Procedure: VIDEO ASSISTED THORACOSCOPY (VATS)/WEDGE RESECTION;  Surgeon: Delight Ovens, MD;  Location: [facility] OR;  Service: Thoracic;  Laterality: Right;   VIDEO BRONCHOSCOPY N/A 10/21/2012   Procedure: VIDEO BRONCHOSCOPY;  Surgeon: Delight Ovens, MD;  Location: [facility] OR;  Service: Thoracic;  Laterality: N/A;   Family History  Problem Relation Age of Onset   Arthritis Mother    Hyperlipidemia Mother    Depression Mother    Anxiety disorder Mother    Dementia Mother    Hypertension Father    Hyperlipidemia Father    Heart disease Father    Stroke Father    Dementia Father    Heart disease Brother    ADD / ADHD Son    Alcohol abuse Maternal Grandfather    Bipolar disorder Neg Hx    Drug abuse Neg Hx    OCD Neg Hx    Paranoid behavior Neg Hx    Schizophrenia Neg Hx     Seizures Neg Hx    Sexual abuse Neg Hx    Physical abuse Neg Hx    Social History   Socioeconomic History   Marital status: Divorced    Spouse name: Not on file   Number of children: 1   Years of education: 12+   Highest education level: Not on file  Occupational History   Occupation: disabled  Tobacco Use   Smoking status: Every Day    Packs/day: 0.50    Years: 50.00    Additional pack years: 0.00    Total pack years: 25.00    Types: Cigarettes   Smokeless tobacco: Never   Tobacco comments:    Quit a few times  Vaping Use   Vaping Use: Former   Quit date: 09/15/2016  Substance and Sexual Activity   Alcohol use: Yes    Alcohol/week: 1.0 standard drink of alcohol    Types: 1 Standard drinks or equivalent per week    Comment: Not weekly   Drug use: Never   Sexual activity: Not Currently    Birth control/protection: Surgical  Other Topics Concern   Not on file  Social History Narrative   Son-lives w/son.   disabled   Social Determinants of Health   Financial Resource Strain: Low Risk  (12/24/2022)   Overall Financial Resource Strain (CARDIA)    Difficulty of Paying Living Expenses: Not hard at all  Food Insecurity: No Food Insecurity (12/24/2022)   Hunger Vital Sign    Worried About Running Out of Food in the Last Year: Never true    Ran Out of Food in the Last Year: Never true  Transportation Needs: No Transportation Needs (12/24/2022)   PRAPARE - Administrator, Civil Service (Medical): No    Lack of Transportation (Non-Medical): No  Physical Activity: Inactive (12/24/2022)   Exercise Vital Sign    Days of Exercise per Week: 0 days    Minutes of Exercise per Session: 0 min  Stress: Stress Concern Present (12/24/2022)  [employer] of Occupational Health - Occupational Stress Questionnaire    Feeling of Stress : Rather much  Social Connections: Socially Isolated (12/24/2022)   Social Connection and Isolation Panel [NHANES]    Frequency of  Communication with Friends and Family: Once a week    Frequency of Social Gatherings with Friends and Family: Once a week    Attends Religious Services: Never    Database administrator or Organizations: No    Attends Engineer, structural: Never    Marital Status: Divorced    Tobacco Counseling Ready to quit: Not Answered Counseling given: Not Answered Tobacco comments: Quit a few times   Clinical Intake:  Pre-visit preparation completed: Yes  Pain : No/denies pain     BMI - recorded: 31.93 Nutritional Status: BMI > 30  Obese Nutritional Risks: None Diabetes: Yes CBG done?: No Did pt. bring in CBG monitor from home?: No  How often do you need to have someone help you when you read instructions, pamphlets, or other written materials from your doctor or pharmacy?: 1 - Never  Interpreter Needed?: No  Information entered by :: Lanier Ensign, LPN   Activities of Daily Living    12/24/2022    2:37 PM 12/26/2021    3:12 PM  In your present state of health, do you have any difficulty performing the following activities:  Hearing? 1 1  Comment HOH HOH  Vision? 0 0  Difficulty concentrating or making decisions? 0 0  Walking or climbing stairs? 1 1  Comment  difficult whe have to climb  Dressing or bathing? 0 0  Comment  slowly  Doing errands, shopping? 0 0  Preparing Food and eating ? N N  Comment  slowly  Using the Toilet? N N  In the past six months, have you accidently leaked urine? Malvin Johns  Comment wears a pad at times  Do you have problems with loss of bowel control? N Y  Comment  at times  Managing your Medications? N N  Managing your Finances? N N  Housekeeping or managing your Housekeeping? N N    Patient Care Team: [name], MD as PCP - General (Family Medicine) [name], MD as Consulting Physician (Hematology) Erroll Luna, [facility] (Inactive) as Pharmacist (Pharmacist)  Indicate any recent Medical Services you may have  received from other than [facility] providers in the past year (date may be approximate).     Assessment:   This is a routine wellness examination for Jaziyah.  Hearing/Vision screen Hearing Screening - Comments:: Pt stated HOH  Vision Screening - Comments:: Encouraged to follow up with new provider   Dietary issues and exercise activities discussed:     Goals Addressed             This Visit's Progress    Patient Stated       Start walking more        Depression Screen    12/24/2022    2:35 PM 11/17/2022    9:38 AM 05/18/2022    9:38 AM 12/26/2021    3:07 PM 02/19/2021   12:01 PM 06/18/2020   10:46 AM 10/16/2019   11:36 AM  PHQ 2/9 Scores  PHQ - 2 Score 4 4 4 3 4 4 4   PHQ- 9 Score 4 18 19 15 20 8 11     Fall Risk    12/24/2022    2:37 PM 12/26/2021    3:11 PM 11/14/2021    9:23  AM 06/18/2020   10:46 AM 10/16/2019   11:36 AM  Fall Risk   Falls in the past year? 0 1 0 0 1  Number falls in past yr: 0 1 0  0  Injury with Fall? 0 0 0  0  Risk for fall due to : Impaired vision;Impaired balance/gait;Impaired mobility Impaired vision;Impaired mobility;Impaired balance/gait Impaired balance/gait;Other (Comment) Impaired mobility;Impaired balance/gait History of fall(s);Impaired balance/gait  Risk for fall due to: Comment   USES A CANE    Follow up Falls prevention discussed Falls prevention discussed  Falls evaluation completed Falls evaluation completed    MEDICARE RISK AT HOME:  Medicare Risk at Home - 12/24/22 1439     Any stairs in or around the home? Yes    If so, are there any without handrails? No    Home free of loose throw rugs in walkways, pet beds, electrical cords, etc? Yes    Adequate lighting in your home to reduce risk of falls? Yes    Life alert? No    Use of a cane, walker or w/c? Yes    Grab bars in the bathroom? Yes    Shower chair or bench in shower? Yes    Elevated toilet seat or a handicapped toilet? No             TIMED UP AND GO:  Was the  test performed?  No    Cognitive Function:        12/24/2022    2:39 PM 12/26/2021    3:15 PM  6CIT Screen  What Year? 0 points 0 points  What month? 0 points 0 points  What time? 0 points 0 points  Count back from 20 0 points 0 points  Months in reverse 0 points 0 points  Repeat phrase 0 points 0 points  Total Score 0 points 0 points     Immunizations Immunization History  Administered Date(s) Administered   Hepatitis B, ADULT 06/29/2001   Hepatitis B, PED/ADOLESCENT 06/29/2001   Influenza,inj,Quad PF,6+ Mos 04/07/2013, 05/18/2014, 05/28/2015, 05/13/2016, 06/11/2017, 07/18/2018, 06/16/2019, 06/18/2020, 05/18/2022   Moderna Sars-Covid-2 Vaccination 11/20/2019, 12/19/2019   Pneumococcal Polysaccharide-23 12/10/2011, 10/25/2013   Td 06/29/2001   Td (Adult), 2 Lf Tetanus Toxid, Preservative Free 06/29/2001   Tdap 12/10/2011   Zoster Recombinat (Shingrix) 11/14/2021    TDAP status: Due, Education has been provided regarding the importance of this vaccine. Advised may receive this vaccine at local pharmacy or Health Dept. Aware to provide a copy of the vaccination record if obtained from local pharmacy or Health Dept. Verbalized acceptance and understanding.  Flu Vaccine status: Up to date    Covid-19 vaccine status: Completed vaccines  Qualifies for Shingles Vaccine? Yes   Zostavax completed Yes   Shingrix Completed?: No.    Education has been provided regarding the importance of this vaccine. Patient has been advised to call insurance company to determine out of pocket expense if they have not yet received this vaccine. Advised may also receive vaccine at local pharmacy or Health Dept. Verbalized acceptance and understanding.  Screening Tests Health Maintenance  Topic Date Due   Colonoscopy  Never done   OPHTHALMOLOGY EXAM  09/07/2018   MAMMOGRAM  04/21/2019   DTaP/Tdap/Td (4 - Td or Tdap) 12/09/2021   Zoster Vaccines- Shingrix (2 of 2) 02/09/2023 (Originally  01/09/2022)   COVID-19 Vaccine (3 - Moderna risk series) 06/23/2023 (Originally 01/16/2020)   INFLUENZA VACCINE  01/28/2023   FOOT EXAM  05/19/2023   HEMOGLOBIN A1C  05/20/2023   Lung Cancer Screening  07/21/2023   Diabetic kidney evaluation - eGFR measurement  11/10/2023   Diabetic kidney evaluation - Urine ACR  11/17/2023   Medicare Annual Wellness (AWV)  12/24/2023   Hepatitis C Screening  Completed   HIV Screening  Completed   HPV VACCINES  Aged Out    Health Maintenance  Health Maintenance Due  Topic Date Due   Colonoscopy  Never done   OPHTHALMOLOGY EXAM  09/07/2018   MAMMOGRAM  04/21/2019   DTaP/Tdap/Td (4 - Td or Tdap) 12/09/2021    Colorectal cancer screening: Referral to GI placed 12/24/22. Pt aware the office will call re: appt.  Mammogram status: Ordered 12/24/22. Pt provided with contact info and advised to call to schedule appt.     Lung Cancer Screening: (Low Dose CT Chest recommended if Age 53-80 years, 20 pack-year currently smoking OR have quit w/in 15years.) does qualify.   Lung Cancer Screening Referral: ordered 12/03/22  Additional Screening:  Hepatitis C Screening:  Completed 10/11/17  Vision Screening: Recommended annual ophthalmology exams for early detection of glaucoma and other disorders of the eye. Is the patient up to date with their annual eye exam?  No  Who is the provider or what is the name of the office in which the patient attends annual eye exams? Encouraged to follow up with provider  If pt is not established with a provider, would they like to be referred to a provider to establish care? No .   Dental Screening: Recommended annual dental exams for proper oral hygiene  Diabetic Foot Exam: Diabetic Foot Exam: Completed 05/18/22  Community Resource Referral / Chronic Care Management: CRR required this visit?  No   CCM required this visit?  No     Plan:     I have personally reviewed and noted the following in the patient's chart:    Medical and social history Use of alcohol, tobacco or illicit drugs  Current medications and supplements including opioid prescriptions. Patient is currently taking opioid prescriptions. Information provided to patient regarding non-opioid alternatives. Patient advised to discuss non-opioid treatment plan with their provider. Functional ability and status Nutritional status Physical activity Advanced directives List of other physicians Hospitalizations, surgeries, and ER visits in previous 12 months Vitals Screenings to include cognitive, depression, and falls Referrals and appointments  In addition, I have reviewed and discussed with patient certain preventive protocols, quality metrics, and best practice recommendations. A written personalized care plan for preventive services as well as general preventive health recommendations were provided to patient.     Marzella Schlein, LPN   [id number]   After Visit Summary: (MyChart) Due to this being a telephonic visit, the after visit summary with patients personalized plan was offered to patient via MyChart   Nurse Notes: none

## 2023-01-04 ENCOUNTER — Other Ambulatory Visit: Payer: Self-pay | Admitting: Family Medicine

## 2023-01-04 DIAGNOSIS — G894 Chronic pain syndrome: Secondary | ICD-10-CM

## 2023-01-12 DIAGNOSIS — M5136 Other intervertebral disc degeneration, lumbar region: Secondary | ICD-10-CM | POA: Diagnosis not present

## 2023-01-12 DIAGNOSIS — R030 Elevated blood-pressure reading, without diagnosis of hypertension: Secondary | ICD-10-CM | POA: Diagnosis not present

## 2023-01-12 DIAGNOSIS — E6609 Other obesity due to excess calories: Secondary | ICD-10-CM | POA: Diagnosis not present

## 2023-01-12 DIAGNOSIS — E559 Vitamin D deficiency, unspecified: Secondary | ICD-10-CM | POA: Diagnosis not present

## 2023-01-12 DIAGNOSIS — M5030 Other cervical disc degeneration, unspecified cervical region: Secondary | ICD-10-CM | POA: Diagnosis not present

## 2023-01-12 DIAGNOSIS — F1120 Opioid dependence, uncomplicated: Secondary | ICD-10-CM | POA: Diagnosis not present

## 2023-01-12 DIAGNOSIS — Z6830 Body mass index (BMI) 30.0-30.9, adult: Secondary | ICD-10-CM | POA: Diagnosis not present

## 2023-01-12 DIAGNOSIS — G894 Chronic pain syndrome: Secondary | ICD-10-CM | POA: Diagnosis not present

## 2023-01-12 DIAGNOSIS — E119 Type 2 diabetes mellitus without complications: Secondary | ICD-10-CM | POA: Diagnosis not present

## 2023-01-25 ENCOUNTER — Other Ambulatory Visit: Payer: Self-pay | Admitting: Family Medicine

## 2023-02-08 ENCOUNTER — Ambulatory Visit (HOSPITAL_COMMUNITY)
Admission: RE | Admit: 2023-02-08 | Discharge: 2023-02-08 | Disposition: A | Discharge status: Home / Self Care | Payer: Medicare HMO | Source: Ambulatory Visit | Attending: Hematology | Admitting: Hematology

## 2023-02-08 DIAGNOSIS — Z85820 Personal history of malignant melanoma of skin: Secondary | ICD-10-CM

## 2023-02-08 DIAGNOSIS — I700 Atherosclerosis of aorta: Secondary | ICD-10-CM | POA: Diagnosis not present

## 2023-02-08 DIAGNOSIS — D45 Polycythemia vera: Secondary | ICD-10-CM | POA: Diagnosis not present

## 2023-02-08 DIAGNOSIS — R918 Other nonspecific abnormal finding of lung field: Secondary | ICD-10-CM

## 2023-02-08 DIAGNOSIS — C439 Malignant melanoma of skin, unspecified: Secondary | ICD-10-CM | POA: Diagnosis not present

## 2023-02-16 DIAGNOSIS — E6609 Other obesity due to excess calories: Secondary | ICD-10-CM | POA: Diagnosis not present

## 2023-02-16 DIAGNOSIS — F1120 Opioid dependence, uncomplicated: Secondary | ICD-10-CM | POA: Diagnosis not present

## 2023-02-16 DIAGNOSIS — E119 Type 2 diabetes mellitus without complications: Secondary | ICD-10-CM | POA: Diagnosis not present

## 2023-02-16 DIAGNOSIS — F17200 Nicotine dependence, unspecified, uncomplicated: Secondary | ICD-10-CM | POA: Diagnosis not present

## 2023-02-16 DIAGNOSIS — R030 Elevated blood-pressure reading, without diagnosis of hypertension: Secondary | ICD-10-CM | POA: Diagnosis not present

## 2023-02-16 DIAGNOSIS — F17210 Nicotine dependence, cigarettes, uncomplicated: Secondary | ICD-10-CM | POA: Diagnosis not present

## 2023-02-16 DIAGNOSIS — M5136 Other intervertebral disc degeneration, lumbar region: Secondary | ICD-10-CM | POA: Diagnosis not present

## 2023-02-16 DIAGNOSIS — Z79899 Other long term (current) drug therapy: Secondary | ICD-10-CM | POA: Diagnosis not present

## 2023-02-16 DIAGNOSIS — Z6831 Body mass index (BMI) 31.0-31.9, adult: Secondary | ICD-10-CM | POA: Diagnosis not present

## 2023-02-16 DIAGNOSIS — G894 Chronic pain syndrome: Secondary | ICD-10-CM | POA: Diagnosis not present

## 2023-02-16 DIAGNOSIS — M5030 Other cervical disc degeneration, unspecified cervical region: Secondary | ICD-10-CM | POA: Diagnosis not present

## 2023-02-18 ENCOUNTER — Other Ambulatory Visit: Payer: Self-pay

## 2023-02-18 DIAGNOSIS — Z79899 Other long term (current) drug therapy: Secondary | ICD-10-CM | POA: Diagnosis not present

## 2023-02-18 DIAGNOSIS — Z85820 Personal history of malignant melanoma of skin: Secondary | ICD-10-CM

## 2023-02-19 ENCOUNTER — Inpatient Hospital Stay: Payer: Medicare HMO

## 2023-02-19 ENCOUNTER — Inpatient Hospital Stay: Payer: Medicare HMO | Attending: Hematology

## 2023-02-19 ENCOUNTER — Inpatient Hospital Stay (HOSPITAL_BASED_OUTPATIENT_CLINIC_OR_DEPARTMENT_OTHER): Payer: Medicare HMO | Admitting: Hematology

## 2023-02-19 VITALS — BP 132/81 | HR 83 | Temp 98.1°F | Resp 16 | Ht 68.0 in | Wt 206.6 lb

## 2023-02-19 DIAGNOSIS — D45 Polycythemia vera: Secondary | ICD-10-CM | POA: Insufficient documentation

## 2023-02-19 DIAGNOSIS — F17210 Nicotine dependence, cigarettes, uncomplicated: Secondary | ICD-10-CM | POA: Insufficient documentation

## 2023-02-19 DIAGNOSIS — Z85820 Personal history of malignant melanoma of skin: Secondary | ICD-10-CM

## 2023-02-19 LAB — FERRITIN: Ferritin: 32 ng/mL (ref 11–307)

## 2023-02-19 LAB — CMP (CANCER CENTER ONLY)
ALT: 9 U/L (ref 0–44)
AST: 12 U/L — ABNORMAL LOW (ref 15–41)
Albumin: 4 g/dL (ref 3.5–5.0)
Alkaline Phosphatase: 77 U/L (ref 38–126)
Anion gap: 6 (ref 5–15)
BUN: 11 mg/dL (ref 8–23)
CO2: 28 mmol/L (ref 22–32)
Calcium: 9.2 mg/dL (ref 8.9–10.3)
Chloride: 102 mmol/L (ref 98–111)
Creatinine: 0.52 mg/dL (ref 0.44–1.00)
GFR, Estimated: >60 mL/min (ref >60)
Glucose, Bld: 134 mg/dL — ABNORMAL HIGH (ref 70–99)
Potassium: 4 mmol/L (ref 3.5–5.1)
Sodium: 136 mmol/L (ref 135–145)
Total Bilirubin: 0.5 mg/dL (ref 0.3–1.2)
Total Protein: 7.2 g/dL (ref 6.5–8.1)

## 2023-02-19 LAB — IRON AND IRON BINDING CAPACITY (CC-WL,HP ONLY)
Iron: 31 ug/dL (ref 28–170)
Saturation Ratios: 6 % — ABNORMAL LOW (ref 10.4–31.8)
TIBC: 482 ug/dL — ABNORMAL HIGH (ref 250–450)
UIBC: 451 ug/dL — ABNORMAL HIGH (ref 148–442)

## 2023-02-19 LAB — CBC WITH DIFFERENTIAL (CANCER CENTER ONLY)
Abs Immature Granulocytes: 0.08 10*3/uL — ABNORMAL HIGH (ref 0.00–0.07)
Basophils Absolute: 0.1 10*3/uL (ref 0.0–0.1)
Basophils Relative: 1 %
Eosinophils Absolute: 0.1 10*3/uL (ref 0.0–0.5)
Eosinophils Relative: 1 %
HCT: 46.1 % — ABNORMAL HIGH (ref 36.0–46.0)
Hemoglobin: 14.6 g/dL (ref 12.0–15.0)
Immature Granulocytes: 1 %
Lymphocytes Relative: 11 %
Lymphs Abs: 1 10*3/uL (ref 0.7–4.0)
MCH: 29.5 pg (ref 26.0–34.0)
MCHC: 31.7 g/dL (ref 30.0–36.0)
MCV: 93.1 fL (ref 80.0–100.0)
Monocytes Absolute: 0.3 10*3/uL (ref 0.1–1.0)
Monocytes Relative: 3 %
Neutro Abs: 7 10*3/uL (ref 1.7–7.7)
Neutrophils Relative %: 83 %
Platelet Count: 427 10*3/uL — ABNORMAL HIGH (ref 150–400)
RBC: 4.95 MIL/uL (ref 3.87–5.11)
RDW: 19.1 % — ABNORMAL HIGH (ref 11.5–15.5)
WBC Count: 8.6 10*3/uL (ref 4.0–10.5)
nRBC: 0 % (ref 0.0–0.2)

## 2023-02-19 NOTE — Progress Notes (Signed)
HEMATOLOGY/ONCOLOGY CLINIC NOTE  Date of Service: 02/19/23   Patient Care Team: [name], MD as PCP - General (Family Medicine) [name], MD as Consulting Physician (Hematology) Erroll Luna, [facility] (Inactive) as Pharmacist (Pharmacist)  CHIEF COMPLAINTS/PURPOSE OF CONSULTATION:  Evaluation and management of polycythemia vera  HISTORY OF PRESENTING ILLNESS:  Please see previous note for details of initial presentation  INTERVAL HISTORY:   Katelyn Cline is here for continued evaluation and management of her polycythemia vera. Patient was last seen by me on 11/10/2022 and was doing well overall with no new medical concerns.   Today, she complains of pain in her neck which causes head pain and intermittent nausea.   She reports that she is no longer taking a muscle relaxant, and denies any other major medication changes.   Patient has been tolerating her Hydroxyurea well without any new or severe toxicities. She reports that she missed doses a couple times a week a few weeks ago due to increased sleeping habits. Patient notes that she has been taking it as prescribed over the last week.  She denies any change in breathing, chest pain, leg swelling, SOB, or abdominal pain. Patient reports previously endorsing dark brown urine which she attributes to dehydration, though this has resolved.   She reports a recent weight loss and denies any significant change in food intake but notes that she may be eating less recently.   She does take hydrochlorothiazide regularly.   MEDICAL HISTORY:  Past Medical History:  Diagnosis Date   Agoraphobia    Anxiety    Arthritis    "all joints"   Chronic low back pain    Chronic respiratory failure with hypoxia ([name])    4L  via [place],  followed by pcp,   (09-16-2018  per pt only uses while at home and at night ,  portable oxygen is not working)   Clotting disorder ([name]) 2013   Polycythemia vera   Colon polyps    COPD  (chronic obstructive pulmonary disease) ([name])    DDD (degenerative disc disease), lumbosacral    Dental caries    DM type 2 (diabetes mellitus, type 2) ([name])    followed by pcp   Fibromyalgia    GERD (gastroesophageal reflux disease)    occasional , no meds   Heart murmur 2020   History of basal cell carcinoma (BCC) excision    s/p  Moh's of face/ nose 12/ 2013   History of palpitations    per pt found to have occaional PVCs   History of UTI    HTN (hypertension)    Hyperlipidemia    Loose, teeth    Major depression    Metastatic melanoma ([name]) 10/18/2012   12 mm posterior right upper lobe pulmonary nodule, max SUV 3.0  s/p  right wedge resection 10-21-2012,  followed by oncologist-- dr Candise Che,  no recurrence   Mixed stress and urge urinary incontinence    Myeloproliferative neoplasm ([name])    Narcolepsy    Obesity    OSA (obstructive sleep apnea)    Oxygen deficiency    Oxygen dependent    4 L via [place],  per pt portable oxygen not working but does use while at home and at night   Panic attacks    Periodontitis    Peripheral neuropathy    feet   Polycythemia vera(238.4) hematology/ oncology-- dr Candise Che ([facility])   first dx 02014 due to smoker/copd--  Jak2 V617F  mutation (positive myeloproliferative syndrome)  hx phlebotomies   Pulmonary nodules    bilateral small stable per CT 03-11-2018   Scoliosis    Sleep apnea    Smokers' cough ([name])    per pt productive a little in the morning's   Symptoms of upper respiratory infection (URI) 03/12/2018   Urinary (tract) obstruction    Wears glasses     SURGICAL HISTORY: Past Surgical History:  Procedure Laterality Date   ABDOMINAL HYSTERECTOMY  1998   partial   BREAST LUMPECTOMY Bilateral 1995   benign per pt   COLONOSCOPY W/ BIOPSIES AND POLYPECTOMY     Hx: of   CYSTECTOMY  2000   abdominal wall    DILATION AND CURETTAGE OF UTERUS  yrs ago   MOHS SURGERY  2013   nose/face   MULTIPLE EXTRACTIONS WITH ALVEOLOPLASTY  N/A 09/19/2018   Procedure: Extraction of tooth #'s 2, 3, 6-14, 18, and 20-30 with alveoloplasty;  Surgeon: Charlynne Pander, DDS;  Location: [facility];  Service: Oral Surgery;  Laterality: N/A;  GENERAL WITH NASAL TUBE   VIDEO ASSISTED THORACOSCOPY (VATS)/WEDGE RESECTION Right 10/21/2012   Procedure: VIDEO ASSISTED THORACOSCOPY (VATS)/WEDGE RESECTION;  Surgeon: Delight Ovens, MD;  Location: [facility] OR;  Service: Thoracic;  Laterality: Right;   VIDEO BRONCHOSCOPY N/A 10/21/2012   Procedure: VIDEO BRONCHOSCOPY;  Surgeon: Delight Ovens, MD;  Location: [facility] OR;  Service: Thoracic;  Laterality: N/A;    SOCIAL HISTORY: Social History   Socioeconomic History   Marital status: Divorced    Spouse name: Not on file   Number of children: 1   Years of education: 12+   Highest education level: Not on file  Occupational History   Occupation: disabled  Tobacco Use   Smoking status: Every Day    Current packs/day: 0.50    Average packs/day: 0.5 packs/day for 50.0 years (25.0 ttl pk-yrs)    Types: Cigarettes   Smokeless tobacco: Never   Tobacco comments:    Quit a few times  Vaping Use   Vaping status: Former   Quit date: 09/15/2016  Substance and Sexual Activity   Alcohol use: Yes    Alcohol/week: 1.0 standard drink of alcohol    Types: 1 Standard drinks or equivalent per week    Comment: Not weekly   Drug use: Never   Sexual activity: Not Currently    Birth control/protection: Surgical  Other Topics Concern   Not on file  Social History Narrative   Son-lives w/son.   disabled   Social Determinants of Health   Financial Resource Strain: Low Risk  (12/24/2022)   Overall Financial Resource Strain (CARDIA)    Difficulty of Paying Living Expenses: Not hard at all  Food Insecurity: No Food Insecurity (12/24/2022)   Hunger Vital Sign    Worried About Running Out of Food in the Last Year: Never true    Ran Out of Food in the Last Year: Never true  Transportation Needs: No Transportation  Needs (12/24/2022)   PRAPARE - Administrator, Civil Service (Medical): No    Lack of Transportation (Non-Medical): No  Physical Activity: Inactive (12/24/2022)   Exercise Vital Sign    Days of Exercise per Week: 0 days    Minutes of Exercise per Session: 0 min  Stress: Stress Concern Present (12/24/2022)   [employer] of Occupational Health - Occupational Stress Questionnaire    Feeling of Stress : Rather much  Social Connections: Socially Isolated (12/24/2022)   Social Connection  and Isolation Panel [NHANES]    Frequency of Communication with Friends and Family: Once a week    Frequency of Social Gatherings with Friends and Family: Once a week    Attends Religious Services: Never    Database administrator or Organizations: No    Attends Banker Meetings: Never    Marital Status: Divorced  Catering manager Violence: Not At Risk (12/24/2022)   Humiliation, Afraid, Rape, and Kick questionnaire    Fear of Current or Ex-Partner: No    Emotionally Abused: No    Physically Abused: No    Sexually Abused: No    FAMILY HISTORY: Family History  Problem Relation Age of Onset   Arthritis Mother    Hyperlipidemia Mother    Depression Mother    Anxiety disorder Mother    Dementia Mother    Hypertension Father    Hyperlipidemia Father    Heart disease Father    Stroke Father    Dementia Father    Heart disease Brother    ADD / ADHD Son    Alcohol abuse Maternal Grandfather    Bipolar disorder Neg Hx    Drug abuse Neg Hx    OCD Neg Hx    Paranoid behavior Neg Hx    Schizophrenia Neg Hx    Seizures Neg Hx    Sexual abuse Neg Hx    Physical abuse Neg Hx     ALLERGIES:  is allergic to contrast media [iodinated contrast media], erythromycin, flagyl [metronidazole], latex, other, penicillins, and tetracyclines & related.  MEDICATIONS:  Current Outpatient Medications  Medication Sig Dispense Refill   amLODipine (NORVASC) 5 MG tablet TAKE ONE TABLET BY  MOUTH EVERYDAY AT BEDTIME 90 tablet 1   aspirin EC 81 MG tablet Take 1 tablet (81 mg total) by mouth daily. Swallow whole. 100 tablet 4   atorvastatin (LIPITOR) 80 MG tablet TAKE ONE TABLET BY MOUTH EVERYDAY AT BEDTIME 90 tablet 1   Blood Glucose Monitoring Suppl (BLOOD GLUCOSE SYSTEM PAK) KIT Please dispense based on patient and insurance preference. Use as directed to monitor FSBS 2x daily. Dx: E11.9 1 kit 1   buPROPion ER (WELLBUTRIN SR) 100 MG 12 hr tablet TAKE ONE TABLET BY MOUTH EVERY MORNING and TAKE ONE TABLET BY MOUTH EVERYDAY AT BEDTIME 180 tablet 1   cyclobenzaprine (FLEXERIL) 5 MG tablet Take 5 mg by mouth 3 (three) times daily as needed.     DULoxetine (CYMBALTA) 60 MG capsule 1 tab po bid 180 capsule 1   ezetimibe (ZETIA) 10 MG tablet Take 1 tablet (10 mg total) by mouth every morning. 90 tablet 1   fenofibrate (TRICOR) 145 MG tablet Take 1 tablet (145 mg total) by mouth daily. 90 tablet 1   fish oil-omega-3 fatty acids 1000 MG capsule Take 2 capsules (2 g total) by mouth 2 (two) times daily. 180 capsule 1   gabapentin (NEURONTIN) 100 MG capsule TAKE THREE CAPSULES BY MOUTH three times daily AS NEEDED 270 capsule 4   glucose blood (ACCU-CHEK GUIDE) test strip Use as instructed 100 each 12   Glucose Blood (BLOOD GLUCOSE TEST STRIPS) STRP Please dispense based on patient and insurance preference. Use as directed to monitor FSBS 2x daily. Dx: E11.9 100 strip 1   hydrochlorothiazide (HYDRODIURIL) 25 MG tablet Take 1 tablet (25 mg total) by mouth every morning. 90 tablet 1   hydroxyurea (HYDREA) 500 MG capsule TAKE TWO CAPSULES BY MOUTH EVERYDAY AT BEDTIME 90 capsule 3   lisinopril (  ZESTRIL) 5 MG tablet TAKE ONE TABLET BY MOUTH EVERYDAY AT BEDTIME 90 tablet 1   metFORMIN (GLUCOPHAGE) 1000 MG tablet TAKE ONE TABLET BY MOUTH EVERY MORNING and TAKE ONE TABLET BY MOUTH EVERYDAY AT BEDTIME 180 tablet 1   Microlet Lancets MISC Please dispense based on patient and insurance preference. Use as  directed to monitor FSBS 2x daily. Dx: E11.9 100 each 11   naloxone (NARCAN) nasal spray 4 mg/0.1 mL SMARTSIG:1 Spray(s) Both Nares PRN (Patient not taking: Reported on 12/24/2022)     nystatin (MYCOSTATIN/NYSTOP) powder Apply 1 Application topically 3 (three) times daily. PA Case: [id number] Approved 06/30/2019- 06/28/2020 60 g 2   nystatin cream (MYCOSTATIN) APPLY ONE application TOPICALLY twice daily TO THE AFFECTED AREA(S) 60 g 2   oxyCODONE-acetaminophen (PERCOCET) 10-325 MG tablet Take 1 tablet by mouth 4 (four) times daily as needed for severe pain.     senna (SENOKOT) 8.6 MG TABS tablet Take 2 tablets by mouth daily as needed for mild constipation.     Vitamin D, Ergocalciferol, (DRISDOL) 1.25 MG (50000 UNIT) CAPS capsule TAKE ONE TABLET BY MOUTH on sundays at bedtime 12 capsule 1   No current facility-administered medications for this visit.    REVIEW OF SYSTEMS:    10 Point review of Systems was done is negative except as noted above.   PHYSICAL EXAMINATION:  .There were no vitals taken for this visit.  GENERAL:alert, in no acute distress and comfortable SKIN: no acute rashes, no significant lesions EYES: conjunctiva are pink and non-injected, sclera anicteric OROPHARYNX: MMM, no exudates, no oropharyngeal erythema or ulceration NECK: supple, no JVD LYMPH:  no palpable lymphadenopathy in the cervical, axillary or inguinal regions LUNGS: clear to auscultation b/l with normal respiratory effort HEART: regular rate & rhythm ABDOMEN:  normoactive bowel sounds , non tender, not distended. Extremity: no pedal edema PSYCH: alert & oriented x 3 with fluent speech NEURO: no focal motor/sensory deficits   LABORATORY DATA:  I have reviewed the data as listed  .    Latest Ref Rng & Units 11/10/2022   11:34 AM 08/18/2022    8:18 AM 05/19/2022    8:39 AM  CBC  WBC 4.0 - 10.5 K/uL 9.3  15.7  12.8   Hemoglobin 12.0 - 15.0 g/dL 28.4  13.2  44.0   Hematocrit 36.0 - 46.0 % 46.9  49.7  47.6    Platelets 150 - 400 K/uL 303  404  261    . CBC    Component Value Date/Time   WBC 9.3 11/10/2022 1134   WBC 20.6 (H) 10/29/2021 0934   RBC 5.07 11/10/2022 1134   HGB 14.9 11/10/2022 1134   HCT 46.9 (H) 11/10/2022 1134   PLT 303 11/10/2022 1134   MCV 92.5 11/10/2022 1134   MCH 29.4 11/10/2022 1134   MCHC 31.8 11/10/2022 1134   RDW 18.9 (H) 11/10/2022 1134   LYMPHSABS 1.1 11/10/2022 1134   MONOABS 0.3 11/10/2022 1134   EOSABS 0.2 11/10/2022 1134   BASOSABS 0.1 11/10/2022 1134     .    Latest Ref Rng & Units 11/10/2022   11:34 AM 08/18/2022    8:18 AM 05/19/2022    8:39 AM  CMP  Glucose 70 - 99 mg/dL 102  725  366   BUN 8 - 23 mg/dL 26  30  18    Creatinine 0.44 - 1.00 mg/dL 4.40  3.47  4.25   Sodium 135 - 145 mmol/L 139  136  138  Potassium 3.5 - 5.1 mmol/L 4.2  4.5  4.2   Chloride 98 - 111 mmol/L 103  99  104   CO2 22 - 32 mmol/L 29  31  27    Calcium 8.9 - 10.3 mg/dL 9.4  25.3  9.6   Total Protein 6.5 - 8.1 g/dL 7.1  7.5  7.2   Total Bilirubin 0.3 - 1.2 mg/dL 0.3  0.3  0.4   Alkaline Phos 38 - 126 U/L 89  110  90   AST 15 - 41 U/L 12  14  12    ALT 0 - 44 U/L 11  12  11     08/15/12 JAK2 Mutation:     03/15/18 BM Bx:      RADIOGRAPHIC STUDIES: I have personally reviewed the radiological images as listed and agreed with the findings in the report. CT Chest Wo Contrast  Result Date: 02/12/2023 CLINICAL DATA:  Lung nodule, melanoma. Smoker. * Tracking Code: BO * EXAM: CT CHEST WITHOUT CONTRAST TECHNIQUE: Multidetector CT imaging of the chest was performed following the standard protocol without IV contrast. RADIATION DOSE REDUCTION: This exam was performed according to the departmental dose-optimization program which includes automated exposure control, adjustment of the mA and/or kV according to patient size and/or use of iterative reconstruction technique. COMPARISON:  07/20/2022, 09/21/2017. FINDINGS: Cardiovascular: Atherosclerotic calcification of the aorta,  aortic valve and coronary arteries. Enlarged pulmonic trunk and heart. No pericardial effusion. Ascending aorta measures up to 3.9 cm (6/53). Mediastinum/Nodes: No pathologically enlarged mediastinal or axillary lymph nodes. Hilar regions are difficult to definitively evaluate without IV contrast. Esophagus is grossly unremarkable. Lungs/Pleura: Smoking related respiratory bronchiolitis. Wedge resection in the right upper lobe. Chronically stable left upper lobe 4 mm nodule (5/57) and posterior 10 mm left lower lobe nodule (5/99). No specific follow-up necessary. No pleural fluid. Debris is seen in the airway. Upper Abdomen: Liver margin is slightly irregular. Probable tiny cyst in the dome of the liver. Visualized portions of the adrenal glands, kidneys, spleen, pancreas, stomach and bowel are otherwise grossly unremarkable. No upper abdominal adenopathy. Musculoskeletal: Degenerative changes in the spine. No worrisome lytic or sclerotic lesions. IMPRESSION: 1. Pulmonary nodules are stable. Recommend return to annual lung cancer screening CT. 2. No evidence of metastatic disease. 3. Liver margin is slightly irregular, indicative of cirrhosis. 4. Aortic atherosclerosis (ICD10-I70.0). Coronary artery calcification. 5. Enlarged pulmonic trunk, indicative of pulmonary arterial hypertension. Electronically Signed   By: Leanna Battles M.D.   On: 02/12/2023 15:00    ASSESSMENT & PLAN:   62 y.o. female with  1. Jak2 V617F mutation positive Myeloproliferative syndrome.- BM Bx consistent with Polycythemia Vera  JAK2 mutation identified in 08/15/12 labs  03/15/18 BM Bx revealed results consistent with JAK2 positive polycythemia vera  Labs done today show hemoglobin of 11.8, WBC count of 21.2k and platelets of 1k  2. H/o Medication/followup Non compliance  3. COPD - O2 dependant. Still smoking 1/2ppd  4. History of Metastatic melanoma to the right lung  S/p resection by Dr. Tyrone Sage on 10/21/12, BRAF mutation  not detected. -continue q47monthly skin screening her PCP/dermatologist -symptom directed radiologic scanning.   5.  Past Medical History:  Diagnosis Date   Agoraphobia    Anxiety    Arthritis    "all joints"   Chronic low back pain    Chronic respiratory failure with hypoxia ([name])    4L  via [place],  followed by pcp,   (09-16-2018  per pt only uses while at home and  at night ,  portable oxygen is not working)   Clotting disorder ([name]) 2013   Polycythemia vera   Colon polyps    COPD (chronic obstructive pulmonary disease) ([name])    DDD (degenerative disc disease), lumbosacral    Dental caries    DM type 2 (diabetes mellitus, type 2) ([name])    followed by pcp   Fibromyalgia    GERD (gastroesophageal reflux disease)    occasional , no meds   Heart murmur 2020   History of basal cell carcinoma (BCC) excision    s/p  Moh's of face/ nose 12/ 2013   History of palpitations    per pt found to have occaional PVCs   History of UTI    HTN (hypertension)    Hyperlipidemia    Loose, teeth    Major depression    Metastatic melanoma ([name]) 10/18/2012   12 mm posterior right upper lobe pulmonary nodule, max SUV 3.0  s/p  right wedge resection 10-21-2012,  followed by oncologist-- dr Candise Che,  no recurrence   Mixed stress and urge urinary incontinence    Myeloproliferative neoplasm ([name])    Narcolepsy    Obesity    OSA (obstructive sleep apnea)    Oxygen deficiency    Oxygen dependent    4 L via [place],  per pt portable oxygen not working but does use while at home and at night   Panic attacks    Periodontitis    Peripheral neuropathy    feet   Polycythemia vera(238.4) hematology/ oncology-- dr Candise Che ([facility])   first dx 02014 due to smoker/copd--  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)  hx phlebotomies   Pulmonary nodules    bilateral small stable per CT 03-11-2018   Scoliosis    Sleep apnea    Smokers' cough ([name])    per pt productive a little in the morning's    Symptoms of upper respiratory infection (URI) 03/12/2018   Urinary (tract) obstruction    Wears glasses    PLAN:  -Discussed lab results on 02/19/2023 in detail with patient. CBC showed WBC of 8.6K, hemoglobin of 14.6, and platelets slightly increased to 427K, previously 300K three months ago -there is a slight increase in platelets, likely a reflection of missing a few doses of Hydroxyurea -CMP shows normal creatinine level of 0.52, normal liver enzymes -discussed results of recent CT chest scan which showed stable pulmonary nodules -patient is iron deficient at this time which is not significantly concerning -no indication for therapeutic phlebotomies at this time -She has no significant toxicities from her current dose of hydroxyurea at 1000 mg p.o. daily. -continue current dose of Hydroxyurea: three tablets on Mondays and Thursdays and two tablets on all other days of the week. Advised patient to avoid any missed doses.  -patient would not like any anti-nausea medication at this time as her symptoms have not been significantly bothersome -patient has stopped muscle relaxants. Discussed option of acupuncture to improve discomfort -discussed option of a soft collar to address neck pain by taking pressure off of the muscle -advised patient to optimize hydration levels especially due to HCZT intake, by drinking at least 2L of water daily -Recommend smoking cessation and advised patient to limit smoking as much as she can -will continue to monitor with labs in 3-4 months  FOLLOW UP: RTC with Dr Candise Che with labs and appointment for therapeutic phlebotomy in 3 months  The total time spent in the appointment was *** minutes* .  All of the patient's questions were answered with apparent satisfaction. The patient knows to call the clinic with any problems, questions or concerns.   Wyvonnia Lora MD MS AAHIVMS [facility] Hematology/Oncology Physician [facility]  .*Total Encounter  Time as defined by the Centers for Medicare and Medicaid Services includes, in addition to the face-to-face time of a patient visit (documented in the note above) non-face-to-face time: obtaining and reviewing outside history, ordering and reviewing medications, tests or procedures, care coordination (communications with other health care professionals or caregivers) and documentation in the medical record.    I,Mitra Faeizi,acting as a Neurosurgeon for Wyvonnia Lora, MD.,have documented all relevant documentation on the behalf of Wyvonnia Lora, MD,as directed by  Wyvonnia Lora, MD while in the presence of Wyvonnia Lora, MD.  ***

## 2023-02-25 ENCOUNTER — Encounter: Payer: Self-pay | Admitting: Hematology

## 2023-03-03 ENCOUNTER — Telehealth: Payer: Self-pay | Admitting: Family Medicine

## 2023-03-04 ENCOUNTER — Other Ambulatory Visit: Payer: Self-pay

## 2023-03-04 ENCOUNTER — Other Ambulatory Visit: Payer: Self-pay | Admitting: *Deleted

## 2023-03-04 DIAGNOSIS — E1140 Type 2 diabetes mellitus with diabetic neuropathy, unspecified: Secondary | ICD-10-CM

## 2023-03-04 DIAGNOSIS — D45 Polycythemia vera: Secondary | ICD-10-CM

## 2023-03-04 DIAGNOSIS — D471 Chronic myeloproliferative disease: Secondary | ICD-10-CM

## 2023-03-04 DIAGNOSIS — I10 Essential (primary) hypertension: Secondary | ICD-10-CM

## 2023-03-04 DIAGNOSIS — E782 Mixed hyperlipidemia: Secondary | ICD-10-CM

## 2023-03-04 DIAGNOSIS — F4310 Post-traumatic stress disorder, unspecified: Secondary | ICD-10-CM

## 2023-03-04 MED ORDER — HYDROXYUREA 500 MG PO CAPS
ORAL_CAPSULE | ORAL | 3 refills | Status: DC
Start: 2023-03-04 — End: 2023-06-15

## 2023-03-04 MED ORDER — BUPROPION HCL ER (SR) 100 MG PO TB12
ORAL_TABLET | ORAL | 1 refills | Status: DC
Start: 2023-03-04 — End: 2023-10-06

## 2023-03-04 MED ORDER — VITAMIN D (ERGOCALCIFEROL) 1.25 MG (50000 UNIT) PO CAPS: ORAL_CAPSULE | ORAL | 1 refills | Status: DC

## 2023-03-04 MED ORDER — LISINOPRIL 5 MG PO TABS
ORAL_TABLET | ORAL | 1 refills | Status: DC
Start: 2023-03-04 — End: 2023-10-06

## 2023-03-04 MED ORDER — METFORMIN HCL 1000 MG PO TABS
ORAL_TABLET | ORAL | 1 refills | Status: DC
Start: 2023-03-04 — End: 2023-10-06

## 2023-03-04 MED ORDER — ATORVASTATIN CALCIUM 80 MG PO TABS: ORAL_TABLET | ORAL | 1 refills | Status: DC

## 2023-03-04 MED ORDER — HYDROCHLOROTHIAZIDE 25 MG PO TABS
25.0000 mg | ORAL_TABLET | Freq: Every morning | ORAL | 1 refills | Status: DC
Start: 2023-03-04 — End: 2023-10-06

## 2023-03-04 MED ORDER — AMLODIPINE BESYLATE 5 MG PO TABS: ORAL_TABLET | ORAL | 1 refills | Status: DC

## 2023-03-04 MED ORDER — EZETIMIBE 10 MG PO TABS: 10.0000 mg | ORAL_TABLET | Freq: Every morning | ORAL | 1 refills | Status: DC

## 2023-03-04 MED ORDER — FENOFIBRATE 145 MG PO TABS
145.0000 mg | ORAL_TABLET | Freq: Every day | ORAL | 1 refills | Status: DC
Start: 2023-03-04 — End: 2023-10-06

## 2023-03-04 MED ORDER — DULOXETINE HCL 60 MG PO CPEP: ORAL_CAPSULE | ORAL | 1 refills | Status: DC

## 2023-03-04 NOTE — Telephone Encounter (Signed)
error 

## 2023-03-15 DIAGNOSIS — H43393 Other vitreous opacities, bilateral: Secondary | ICD-10-CM | POA: Diagnosis not present

## 2023-03-15 DIAGNOSIS — H25813 Combined forms of age-related cataract, bilateral: Secondary | ICD-10-CM | POA: Diagnosis not present

## 2023-03-15 DIAGNOSIS — H04123 Dry eye syndrome of bilateral lacrimal glands: Secondary | ICD-10-CM | POA: Diagnosis not present

## 2023-03-15 DIAGNOSIS — H35033 Hypertensive retinopathy, bilateral: Secondary | ICD-10-CM | POA: Diagnosis not present

## 2023-03-15 DIAGNOSIS — H524 Presbyopia: Secondary | ICD-10-CM | POA: Diagnosis not present

## 2023-03-15 LAB — HM DIABETES EYE EXAM

## 2023-03-16 ENCOUNTER — Encounter: Payer: Self-pay | Admitting: Family Medicine

## 2023-03-18 DIAGNOSIS — R5383 Other fatigue: Secondary | ICD-10-CM | POA: Diagnosis not present

## 2023-03-18 DIAGNOSIS — M5030 Other cervical disc degeneration, unspecified cervical region: Secondary | ICD-10-CM | POA: Diagnosis not present

## 2023-03-18 DIAGNOSIS — F1120 Opioid dependence, uncomplicated: Secondary | ICD-10-CM | POA: Diagnosis not present

## 2023-03-18 DIAGNOSIS — M129 Arthropathy, unspecified: Secondary | ICD-10-CM | POA: Diagnosis not present

## 2023-03-18 DIAGNOSIS — E7800 Pure hypercholesterolemia, unspecified: Secondary | ICD-10-CM | POA: Diagnosis not present

## 2023-03-18 DIAGNOSIS — M5136 Other intervertebral disc degeneration, lumbar region: Secondary | ICD-10-CM | POA: Diagnosis not present

## 2023-03-18 DIAGNOSIS — E6609 Other obesity due to excess calories: Secondary | ICD-10-CM | POA: Diagnosis not present

## 2023-03-18 DIAGNOSIS — E559 Vitamin D deficiency, unspecified: Secondary | ICD-10-CM | POA: Diagnosis not present

## 2023-03-18 DIAGNOSIS — Z0000 Encounter for general adult medical examination without abnormal findings: Secondary | ICD-10-CM | POA: Diagnosis not present

## 2023-03-18 DIAGNOSIS — Z79899 Other long term (current) drug therapy: Secondary | ICD-10-CM | POA: Diagnosis not present

## 2023-03-18 DIAGNOSIS — M797 Fibromyalgia: Secondary | ICD-10-CM | POA: Diagnosis not present

## 2023-03-18 DIAGNOSIS — M25512 Pain in left shoulder: Secondary | ICD-10-CM | POA: Diagnosis not present

## 2023-03-18 DIAGNOSIS — Z1159 Encounter for screening for other viral diseases: Secondary | ICD-10-CM | POA: Diagnosis not present

## 2023-03-18 DIAGNOSIS — Z131 Encounter for screening for diabetes mellitus: Secondary | ICD-10-CM | POA: Diagnosis not present

## 2023-03-18 DIAGNOSIS — M25511 Pain in right shoulder: Secondary | ICD-10-CM | POA: Diagnosis not present

## 2023-03-18 DIAGNOSIS — F17200 Nicotine dependence, unspecified, uncomplicated: Secondary | ICD-10-CM | POA: Diagnosis not present

## 2023-03-18 DIAGNOSIS — F17210 Nicotine dependence, cigarettes, uncomplicated: Secondary | ICD-10-CM | POA: Diagnosis not present

## 2023-03-26 DIAGNOSIS — I4891 Unspecified atrial fibrillation: Secondary | ICD-10-CM | POA: Diagnosis not present

## 2023-03-26 DIAGNOSIS — I110 Hypertensive heart disease with heart failure: Secondary | ICD-10-CM | POA: Diagnosis not present

## 2023-03-26 DIAGNOSIS — Z79899 Other long term (current) drug therapy: Secondary | ICD-10-CM | POA: Diagnosis not present

## 2023-03-26 DIAGNOSIS — R079 Chest pain, unspecified: Secondary | ICD-10-CM | POA: Diagnosis not present

## 2023-03-26 DIAGNOSIS — R7989 Other specified abnormal findings of blood chemistry: Secondary | ICD-10-CM | POA: Diagnosis not present

## 2023-03-26 DIAGNOSIS — R0602 Shortness of breath: Secondary | ICD-10-CM | POA: Diagnosis not present

## 2023-03-26 DIAGNOSIS — I350 Nonrheumatic aortic (valve) stenosis: Secondary | ICD-10-CM | POA: Diagnosis not present

## 2023-03-26 DIAGNOSIS — E119 Type 2 diabetes mellitus without complications: Secondary | ICD-10-CM | POA: Diagnosis not present

## 2023-03-26 DIAGNOSIS — E669 Obesity, unspecified: Secondary | ICD-10-CM | POA: Diagnosis not present

## 2023-03-26 DIAGNOSIS — D72829 Elevated white blood cell count, unspecified: Secondary | ICD-10-CM | POA: Diagnosis not present

## 2023-03-26 DIAGNOSIS — I509 Heart failure, unspecified: Secondary | ICD-10-CM | POA: Diagnosis not present

## 2023-03-26 DIAGNOSIS — M79604 Pain in right leg: Secondary | ICD-10-CM | POA: Diagnosis not present

## 2023-03-26 DIAGNOSIS — R0600 Dyspnea, unspecified: Secondary | ICD-10-CM | POA: Diagnosis not present

## 2023-03-26 DIAGNOSIS — I5089 Other heart failure: Secondary | ICD-10-CM | POA: Diagnosis not present

## 2023-03-26 DIAGNOSIS — Z9981 Dependence on supplemental oxygen: Secondary | ICD-10-CM | POA: Diagnosis not present

## 2023-03-26 DIAGNOSIS — D45 Polycythemia vera: Secondary | ICD-10-CM | POA: Diagnosis not present

## 2023-03-26 DIAGNOSIS — J441 Chronic obstructive pulmonary disease with (acute) exacerbation: Secondary | ICD-10-CM | POA: Diagnosis not present

## 2023-03-26 DIAGNOSIS — R072 Precordial pain: Secondary | ICD-10-CM | POA: Diagnosis not present

## 2023-03-26 DIAGNOSIS — I209 Angina pectoris, unspecified: Secondary | ICD-10-CM | POA: Diagnosis not present

## 2023-03-26 DIAGNOSIS — I480 Paroxysmal atrial fibrillation: Secondary | ICD-10-CM | POA: Diagnosis not present

## 2023-03-26 DIAGNOSIS — J449 Chronic obstructive pulmonary disease, unspecified: Secondary | ICD-10-CM | POA: Diagnosis not present

## 2023-03-27 DIAGNOSIS — R0602 Shortness of breath: Secondary | ICD-10-CM | POA: Diagnosis not present

## 2023-03-27 DIAGNOSIS — R7989 Other specified abnormal findings of blood chemistry: Secondary | ICD-10-CM | POA: Diagnosis not present

## 2023-03-27 DIAGNOSIS — I5089 Other heart failure: Secondary | ICD-10-CM | POA: Diagnosis not present

## 2023-03-27 DIAGNOSIS — J449 Chronic obstructive pulmonary disease, unspecified: Secondary | ICD-10-CM | POA: Diagnosis not present

## 2023-03-27 DIAGNOSIS — R079 Chest pain, unspecified: Secondary | ICD-10-CM | POA: Diagnosis not present

## 2023-03-28 DIAGNOSIS — I480 Paroxysmal atrial fibrillation: Secondary | ICD-10-CM | POA: Diagnosis not present

## 2023-03-28 DIAGNOSIS — I509 Heart failure, unspecified: Secondary | ICD-10-CM | POA: Diagnosis not present

## 2023-03-28 DIAGNOSIS — R7989 Other specified abnormal findings of blood chemistry: Secondary | ICD-10-CM | POA: Diagnosis not present

## 2023-03-29 DIAGNOSIS — I209 Angina pectoris, unspecified: Secondary | ICD-10-CM | POA: Diagnosis not present

## 2023-04-01 DIAGNOSIS — H524 Presbyopia: Secondary | ICD-10-CM | POA: Diagnosis not present

## 2023-04-07 ENCOUNTER — Ambulatory Visit: Payer: Medicare HMO | Admitting: Family Medicine

## 2023-04-07 ENCOUNTER — Other Ambulatory Visit: Payer: Self-pay | Admitting: Family Medicine

## 2023-04-07 ENCOUNTER — Encounter: Payer: Self-pay | Admitting: Family Medicine

## 2023-04-07 VITALS — BP 133/78 | HR 83 | Temp 97.9°F | Resp 18 | Ht 68.0 in | Wt 207.4 lb

## 2023-04-07 DIAGNOSIS — J441 Chronic obstructive pulmonary disease with (acute) exacerbation: Secondary | ICD-10-CM | POA: Diagnosis not present

## 2023-04-07 DIAGNOSIS — R600 Localized edema: Secondary | ICD-10-CM

## 2023-04-07 DIAGNOSIS — Z23 Encounter for immunization: Secondary | ICD-10-CM

## 2023-04-07 DIAGNOSIS — I480 Paroxysmal atrial fibrillation: Secondary | ICD-10-CM | POA: Diagnosis not present

## 2023-04-07 DIAGNOSIS — R0789 Other chest pain: Secondary | ICD-10-CM

## 2023-04-07 MED ORDER — DILTIAZEM HCL ER COATED BEADS 120 MG PO CP24
120.0000 mg | ORAL_CAPSULE | Freq: Every day | ORAL | 1 refills | Status: DC
Start: 2023-04-07 — End: 2023-04-08

## 2023-04-07 MED ORDER — ELIQUIS 5 MG PO TABS
5.0000 mg | ORAL_TABLET | Freq: Two times a day (BID) | ORAL | 1 refills | Status: DC
Start: 2023-04-07 — End: 2023-09-06

## 2023-04-07 MED ORDER — FUROSEMIDE 20 MG PO TABS: 20.0000 mg | ORAL_TABLET | Freq: Every day | ORAL | 3 refills | Status: DC | PRN

## 2023-04-07 MED ORDER — POTASSIUM CHLORIDE CRYS ER 20 MEQ PO TBCR: 20.0000 meq | EXTENDED_RELEASE_TABLET | Freq: Every day | ORAL | 3 refills | Status: DC | PRN

## 2023-04-07 NOTE — Patient Instructions (Addendum)
It was very nice to see you today!  Referral to Card Referral to Pulm  Lasix for fluid as needed daily.  Elevate legs  Same meds.    PLEASE NOTE:  If you had any lab tests please let us know if you have not heard back within a few days. You may see your results on MyChart before we have a chance to review them but we will give you a call once they are reviewed by [place]. If we ordered any referrals today, please let us know if you have not heard from their office within the next week.   Please try these tips to maintain a healthy lifestyle:  Eat most of your calories during the day when you are active. Eliminate processed foods including packaged sweets (pies, cakes, cookies), reduce intake of potatoes, white bread, white pasta, and white rice. Look for whole grain options, oat flour or almond flour.  Each meal should contain half fruits/vegetables, one quarter protein, and one quarter carbs (no bigger than a computer mouse).  Cut down on sweet beverages. This includes juice, soda, and sweet tea. Also watch fruit intake, though this is a healthier sweet option, it still contains natural sugar! Limit to 3 servings daily.  Drink at least 1 glass of water with each meal and aim for at least 8 glasses per day  Exercise at least 150 minutes every week.

## 2023-04-07 NOTE — Progress Notes (Signed)
Subjective:    Patient ID: Katelyn Cline, female    DOB: 1961/03/09, 62 y.o.   MRN: [id number]  Chief Complaint  Patient presents with   Hospitalization Follow-up    Hospital follow-up for chest pain and SOB on 03/29/23 in [place]   HPI Pt on oxygen and using a cane  ED F/u Chest Pain/ SOB - States she was visiting family in [place] when she began to experience extreme SOB as soon as she woke up on 9/27. States she has no Hx of heart failure. Afib and CHF Dx while in the ED. Now, she endorses continued neck and chest pain that has traveled down to the upper right side of her chest. Reports improved breathing since discharge. Reports yesterday was her last day smoking.    LE Edema - Complains of swelling in both feet, worse in the right foot.   Fatigue - Endorses extreme exhaustion and fatigue to complete simple activities such as cooking.   Health Maintenance Due  Topic Date Due   Colonoscopy  Never done   MAMMOGRAM  04/21/2019   DTaP/Tdap/Td (4 - Td or Tdap) 12/09/2021    Past Medical History:  Diagnosis Date   Agoraphobia    Anxiety    Arthritis    "all joints"   Chronic low back pain    Chronic respiratory failure with hypoxia ([name])    4L  via [place],  followed by pcp,   (09-16-2018  per pt only uses while at home and at night ,  portable oxygen is not working)   Clotting disorder ([name]) 2013   Polycythemia vera   Colon polyps    COPD (chronic obstructive pulmonary disease) ([name])    DDD (degenerative disc disease), lumbosacral    Dental caries    DM type 2 (diabetes mellitus, type 2) ([name])    followed by pcp   Fibromyalgia    GERD (gastroesophageal reflux disease)    occasional , no meds   Heart murmur 2020   History of basal cell carcinoma (BCC) excision    s/p  Moh's of face/ nose 12/ 2013   History of palpitations    per pt found to have occaional PVCs   History of UTI    HTN (hypertension)    Hyperlipidemia    Loose, teeth    Major depression    Metastatic  melanoma ([name]) 10/18/2012   12 mm posterior right upper lobe pulmonary nodule, max SUV 3.0  s/p  right wedge resection 10-21-2012,  followed by oncologist-- dr Candise Che,  no recurrence   Mixed stress and urge urinary incontinence    Myeloproliferative neoplasm ([name])    Narcolepsy    Obesity    OSA (obstructive sleep apnea)    Oxygen deficiency    Oxygen dependent    4 L via [place],  per pt portable oxygen not working but does use while at home and at night   Panic attacks    Periodontitis    Peripheral neuropathy    feet   Polycythemia vera(238.4) hematology/ oncology-- dr Candise Che ([facility])   first dx 02014 due to smoker/copd--  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)  hx phlebotomies   Pulmonary nodules    bilateral small stable per CT 03-11-2018   Scoliosis    Sleep apnea    Smokers' cough ([name])    per pt productive a little in the morning's   Symptoms of upper respiratory infection (URI) 03/12/2018   Urinary (tract) obstruction  Wears glasses     Past Surgical History:  Procedure Laterality Date   ABDOMINAL HYSTERECTOMY  1998   partial   BREAST LUMPECTOMY Bilateral 1995   benign per pt   COLONOSCOPY W/ BIOPSIES AND POLYPECTOMY     Hx: of   CYSTECTOMY  2000   abdominal wall    DILATION AND CURETTAGE OF UTERUS  yrs ago   MOHS SURGERY  2013   nose/face   MULTIPLE EXTRACTIONS WITH ALVEOLOPLASTY N/A 09/19/2018   Procedure: Extraction of tooth #'s 2, 3, 6-14, 18, and 20-30 with alveoloplasty;  Surgeon: Charlynne Pander, DDS;  Location: [facility];  Service: Oral Surgery;  Laterality: N/A;  GENERAL WITH NASAL TUBE   VIDEO ASSISTED THORACOSCOPY (VATS)/WEDGE RESECTION Right 10/21/2012   Procedure: VIDEO ASSISTED THORACOSCOPY (VATS)/WEDGE RESECTION;  Surgeon: Delight Ovens, MD;  Location: [facility];  Service: Thoracic;  Laterality: Right;   VIDEO BRONCHOSCOPY N/A 10/21/2012   Procedure: VIDEO BRONCHOSCOPY;  Surgeon: Delight Ovens, MD;  Location: [facility] OR;   Service: Thoracic;  Laterality: N/A;     Current Outpatient Medications:    aspirin EC 81 MG tablet, Take 1 tablet (81 mg total) by mouth daily. Swallow whole., Disp: 100 tablet, Rfl: 4   atorvastatin (LIPITOR) 80 MG tablet, TAKE ONE TABLET BY MOUTH EVERYDAY AT BEDTIME, Disp: 90 tablet, Rfl: 1   Blood Glucose Monitoring Suppl (BLOOD GLUCOSE SYSTEM PAK) KIT, Please dispense based on patient and insurance preference. Use as directed to monitor FSBS 2x daily. Dx: E11.9, Disp: 1 kit, Rfl: 1   buPROPion ER (WELLBUTRIN SR) 100 MG 12 hr tablet, TAKE ONE TABLET BY MOUTH EVERY MORNING and TAKE ONE TABLET BY MOUTH EVERYDAY AT BEDTIME, Disp: 180 tablet, Rfl: 1   DULoxetine (CYMBALTA) 60 MG capsule, 1 tab po bid, Disp: 180 capsule, Rfl: 1   ezetimibe (ZETIA) 10 MG tablet, Take 1 tablet (10 mg total) by mouth every morning., Disp: 90 tablet, Rfl: 1   fenofibrate (TRICOR) 145 MG tablet, Take 1 tablet (145 mg total) by mouth daily., Disp: 90 tablet, Rfl: 1   fish oil-omega-3 fatty acids 1000 MG capsule, Take 2 capsules (2 g total) by mouth 2 (two) times daily., Disp: 180 capsule, Rfl: 1   furosemide (LASIX) 20 MG tablet, Take 1 tablet (20 mg total) by mouth daily as needed., Disp: 30 tablet, Rfl: 3   glucose blood (ACCU-CHEK GUIDE) test strip, Use as instructed, Disp: 100 each, Rfl: 12   Glucose Blood (BLOOD GLUCOSE TEST STRIPS) STRP, Please dispense based on patient and insurance preference. Use as directed to monitor FSBS 2x daily. Dx: E11.9, Disp: 100 strip, Rfl: 1   hydrochlorothiazide (HYDRODIURIL) 25 MG tablet, Take 1 tablet (25 mg total) by mouth every morning., Disp: 90 tablet, Rfl: 1   hydroxyurea (HYDREA) 500 MG capsule, TAKE TWO CAPSULES BY MOUTH EVERYDAY AT BEDTIME, Disp: 90 capsule, Rfl: 3   lisinopril (ZESTRIL) 5 MG tablet, TAKE ONE TABLET BY MOUTH EVERYDAY AT BEDTIME, Disp: 90 tablet, Rfl: 1   metFORMIN (GLUCOPHAGE) 1000 MG tablet, TAKE ONE TABLET BY MOUTH EVERY MORNING and TAKE ONE TABLET BY  MOUTH EVERYDAY AT BEDTIME, Disp: 180 tablet, Rfl: 1   Microlet Lancets MISC, Please dispense based on patient and insurance preference. Use as directed to monitor FSBS 2x daily. Dx: E11.9, Disp: 100 each, Rfl: 11   naloxone (NARCAN) nasal spray 4 mg/0.1 mL, , Disp: , Rfl:    nystatin (MYCOSTATIN/NYSTOP) powder, Apply 1 Application topically 3 (three) times daily.  PA Case: [id number] Approved 06/30/2019- 06/28/2020, Disp: 60 g, Rfl: 2   nystatin cream (MYCOSTATIN), APPLY ONE application TOPICALLY twice daily TO THE AFFECTED AREA(S), Disp: 60 g, Rfl: 2   oxyCODONE-acetaminophen (PERCOCET) 10-325 MG tablet, Take 1 tablet by mouth 4 (four) times daily as needed for severe pain., Disp: , Rfl:    potassium chloride SA (KLOR-CON M) 20 MEQ tablet, Take 1 tablet (20 mEq total) by mouth daily as needed. Take if take furosemide, Disp: 30 tablet, Rfl: 3   senna (SENOKOT) 8.6 MG TABS tablet, Take 2 tablets by mouth daily as needed for mild constipation., Disp: , Rfl:    Vitamin D, Ergocalciferol, (DRISDOL) 1.25 MG (50000 UNIT) CAPS capsule, TAKE ONE TABLET BY MOUTH on sundays at bedtime, Disp: 12 capsule, Rfl: 1   XTAMPZA ER 9 MG C12A, Take 1 capsule by mouth 2 (two) times daily., Disp: , Rfl:    diltiazem (CARDIZEM CD) 120 MG 24 hr capsule, Take 1 capsule (120 mg total) by mouth daily., Disp: 90 capsule, Rfl: 1   ELIQUIS 5 MG TABS tablet, Take 1 tablet (5 mg total) by mouth 2 (two) times daily., Disp: 180 tablet, Rfl: 1  Allergies  Allergen Reactions   Contrast Media [Iodinated Contrast Media] Anaphylaxis   Erythromycin     Stomach pain   Flagyl [Metronidazole] Other (See Comments)    Generalized pain.   Latex Other (See Comments)    Was told to be careful because of Dye allergy   Other     Iodine Dye   Penicillins Other (See Comments)    Patient was an infant, no idea of reaction. Tolerates Keflex. Did it involve swelling of the face/tongue/throat, SOB, or low BP? Unknown Did it involve sudden or severe  rash/hives, skin peeling, or any reaction on the inside of your mouth or nose? Unknown Did you need to seek medical attention at a hospital or doctor's office? Unknown When did it last happen?      infant If all above answers are "NO", may proceed with cephalosporin use.    Tetracyclines & Related Other (See Comments)    GI side effects   ROS neg/noncontributory except as noted HPI/below      Objective:     BP 133/78   Pulse 83   Temp 97.9 F (36.6 C) (Temporal)   Resp 18   Ht 5\' 8"  (1.727 m)   Wt 207 lb 6 oz (94.1 kg)   SpO2 94% Comment: 2L O2  BMI 31.53 kg/m  Wt Readings from Last 3 Encounters:  04/07/23 207 lb 6 oz (94.1 kg)  02/19/23 206 lb 9.6 oz (93.7 kg)  12/24/22 210 lb (95.3 kg)    Physical Exam   Gen: WDWN NAD HEENT: NCAT, conjunctiva not injected, sclera nonicteric NECK:  supple, no thyromegaly, no nodes, no carotid bruits CARDIAC: RRR, S1S2+. DP 2+B +2/6 systolic murmur  LUNGS: CTAB. No wheezes ABDOMEN:  BS+, soft, NTND, No HSM, no masses EXT:  +edema MSK: no gross abnormalities. +using a cane NEURO: A&O x3.  CN II-XII intact.  PSYCH: normal mood. Good eye contact     Assessment & Plan:  Paroxysmal atrial fibrillation ([name]) -     Eliquis; Take 1 tablet (5 mg total) by mouth 2 (two) times daily.  Dispense: 180 tablet; Refill: 1 -     dilTIAZem HCl ER Coated Beads; Take 1 capsule (120 mg total) by mouth daily.  Dispense: 90 capsule; Refill: 1 -     Ambulatory  referral to Cardiology  COPD exacerbation [facility]) -     Ambulatory referral to Pulmonology  Other orders -     Furosemide; Take 1 tablet (20 mg total) by mouth daily as needed.  Dispense: 30 tablet; Refill: 3 -     Potassium Chloride Crys ER; Take 1 tablet (20 mEq total) by mouth daily as needed. Take if take furosemide  Dispense: 30 tablet; Refill: 3    Return for as sch in Nov.  I,Anaiya N Rice,acting as a scribe for Angelena Sole, MD.,have documented all relevant documentation on the behalf  of Angelena Sole, MD,as directed by  Angelena Sole, MD while in the presence of Angelena Sole, MD.  Juleen [name], have reviewed all documentation for this visit. The documentation on 04/07/23 for the exam, diagnosis, procedures, and orders are all accurate and complete. ***  Anaiya N Rice

## 2023-04-08 MED ORDER — DILTIAZEM HCL ER COATED BEADS 120 MG PO CP24: 120.0000 mg | ORAL_CAPSULE | Freq: Every day | ORAL | 1 refills | Status: DC

## 2023-04-08 NOTE — Addendum Note (Signed)
Addended by: Jobe Gibbon on: 04/08/2023 09:22 AM   Modules accepted: Orders

## 2023-04-09 NOTE — Telephone Encounter (Signed)
Spoke with pharmacy and confirmed amlodipine has been d/c.

## 2023-04-13 ENCOUNTER — Other Ambulatory Visit: Payer: Self-pay | Admitting: *Deleted

## 2023-04-13 MED ORDER — NYSTATIN 100000 UNIT/GM EX CREA: TOPICAL_CREAM | Freq: Two times a day (BID) | CUTANEOUS | 2 refills | Status: DC

## 2023-04-16 DIAGNOSIS — E119 Type 2 diabetes mellitus without complications: Secondary | ICD-10-CM | POA: Diagnosis not present

## 2023-04-16 DIAGNOSIS — F1120 Opioid dependence, uncomplicated: Secondary | ICD-10-CM | POA: Diagnosis not present

## 2023-04-16 DIAGNOSIS — E6609 Other obesity due to excess calories: Secondary | ICD-10-CM | POA: Diagnosis not present

## 2023-04-16 DIAGNOSIS — F17200 Nicotine dependence, unspecified, uncomplicated: Secondary | ICD-10-CM | POA: Diagnosis not present

## 2023-04-16 DIAGNOSIS — F17210 Nicotine dependence, cigarettes, uncomplicated: Secondary | ICD-10-CM | POA: Diagnosis not present

## 2023-04-16 DIAGNOSIS — M5030 Other cervical disc degeneration, unspecified cervical region: Secondary | ICD-10-CM | POA: Diagnosis not present

## 2023-04-16 DIAGNOSIS — E7800 Pure hypercholesterolemia, unspecified: Secondary | ICD-10-CM | POA: Diagnosis not present

## 2023-04-16 DIAGNOSIS — M51369 Other intervertebral disc degeneration, lumbar region without mention of lumbar back pain or lower extremity pain: Secondary | ICD-10-CM | POA: Diagnosis not present

## 2023-04-16 DIAGNOSIS — Z6831 Body mass index (BMI) 31.0-31.9, adult: Secondary | ICD-10-CM | POA: Diagnosis not present

## 2023-04-16 DIAGNOSIS — Z79899 Other long term (current) drug therapy: Secondary | ICD-10-CM | POA: Diagnosis not present

## 2023-04-22 DIAGNOSIS — Z79899 Other long term (current) drug therapy: Secondary | ICD-10-CM | POA: Diagnosis not present

## 2023-05-12 ENCOUNTER — Inpatient Hospital Stay: Payer: Medicare HMO | Attending: Hematology

## 2023-05-12 ENCOUNTER — Inpatient Hospital Stay: Payer: Medicare HMO | Admitting: Hematology

## 2023-05-12 ENCOUNTER — Inpatient Hospital Stay: Payer: Medicare HMO

## 2023-05-14 DIAGNOSIS — I10 Essential (primary) hypertension: Secondary | ICD-10-CM | POA: Diagnosis not present

## 2023-05-14 DIAGNOSIS — F17200 Nicotine dependence, unspecified, uncomplicated: Secondary | ICD-10-CM | POA: Diagnosis not present

## 2023-05-14 DIAGNOSIS — N390 Urinary tract infection, site not specified: Secondary | ICD-10-CM | POA: Diagnosis not present

## 2023-05-14 DIAGNOSIS — E119 Type 2 diabetes mellitus without complications: Secondary | ICD-10-CM | POA: Diagnosis not present

## 2023-05-14 DIAGNOSIS — M51369 Other intervertebral disc degeneration, lumbar region without mention of lumbar back pain or lower extremity pain: Secondary | ICD-10-CM | POA: Diagnosis not present

## 2023-05-14 DIAGNOSIS — Z6830 Body mass index (BMI) 30.0-30.9, adult: Secondary | ICD-10-CM | POA: Diagnosis not present

## 2023-05-14 DIAGNOSIS — M5030 Other cervical disc degeneration, unspecified cervical region: Secondary | ICD-10-CM | POA: Diagnosis not present

## 2023-05-14 DIAGNOSIS — E7800 Pure hypercholesterolemia, unspecified: Secondary | ICD-10-CM | POA: Diagnosis not present

## 2023-05-14 DIAGNOSIS — F1120 Opioid dependence, uncomplicated: Secondary | ICD-10-CM | POA: Diagnosis not present

## 2023-05-14 DIAGNOSIS — F17210 Nicotine dependence, cigarettes, uncomplicated: Secondary | ICD-10-CM | POA: Diagnosis not present

## 2023-05-14 DIAGNOSIS — Z79899 Other long term (current) drug therapy: Secondary | ICD-10-CM | POA: Diagnosis not present

## 2023-05-18 ENCOUNTER — Ambulatory Visit: Payer: Medicare HMO | Admitting: Family Medicine

## 2023-05-19 DIAGNOSIS — Z79899 Other long term (current) drug therapy: Secondary | ICD-10-CM | POA: Diagnosis not present

## 2023-06-08 ENCOUNTER — Ambulatory Visit: Payer: Medicare HMO | Attending: Cardiovascular Disease | Admitting: Cardiovascular Disease

## 2023-06-08 ENCOUNTER — Telehealth: Payer: Self-pay | Admitting: Cardiovascular Disease

## 2023-06-08 ENCOUNTER — Ambulatory Visit: Payer: Medicare HMO | Attending: Cardiovascular Disease

## 2023-06-08 ENCOUNTER — Encounter: Payer: Self-pay | Admitting: Cardiovascular Disease

## 2023-06-08 VITALS — BP 120/64 | HR 77 | Ht 69.0 in | Wt 207.0 lb

## 2023-06-08 DIAGNOSIS — E782 Mixed hyperlipidemia: Secondary | ICD-10-CM

## 2023-06-08 DIAGNOSIS — I4891 Unspecified atrial fibrillation: Secondary | ICD-10-CM

## 2023-06-08 DIAGNOSIS — I10 Essential (primary) hypertension: Secondary | ICD-10-CM | POA: Diagnosis not present

## 2023-06-08 DIAGNOSIS — F17200 Nicotine dependence, unspecified, uncomplicated: Secondary | ICD-10-CM

## 2023-06-08 NOTE — Assessment & Plan Note (Signed)
Patient was referred for atrial fibrillation.  She thinks that she has had PAF for the last 6 years although recently it appears to be more chronic..This patients CHA2DS2-VASc Score and unadjusted Ischemic Stroke Rate (% per year) is equal to 3.2 % stroke rate/year from a score of 3  Above score calculated as 1 point each if present [CHF, HTN, DM, Vascular=MI/PAD/Aortic Plaque, Age if 65-74, or Female] Above score calculated as 2 points each if present [Age > 75, or Stroke/TIA/TE]   She currently is on Eliquis oral anticoagulation and diltiazem for rate control.

## 2023-06-08 NOTE — Patient Instructions (Addendum)
Medication Instructions:  Your physician recommends that you continue on your current medications as directed. Please refer to the Current Medication list given to you today.  *If you need a refill on your cardiac medications before your next appointment, please call your pharmacy*   Testing/Procedures: Your physician has requested that you have an echocardiogram. Echocardiography is a painless test that uses sound waves to create images of your heart. It provides your doctor with information about the size and shape of your heart and how well your heart's chambers and valves are working. This procedure takes approximately one hour. There are no restrictions for this procedure. Please do NOT wear cologne, perfume, aftershave, or lotions (deodorant is allowed). Please arrive 15 minutes prior to your appointment time. This will take place at [address] [place]  Please note: We ask at that you not bring children with you during ultrasound (echo/ vascular) testing. Due to room size and safety concerns, children are not allowed in the ultrasound rooms during exams. Our front office staff cannot provide observation of children in our lobby area while testing is being conducted. An adult accompanying a patient to their appointment will only be allowed in the ultrasound room at the discretion of the ultrasound technician under special circumstances. We apologize for any inconvenience.   Dr. Allyson Sabal has ordered a CT coronary calcium score.   Test locations:  [facility]  [place] at [facility]  This is $99 out of pocket.   Coronary CalciumScan A coronary calcium scan is an imaging test used to look for deposits of calcium and other fatty materials (plaques) in the inner lining of the blood vessels of the heart (coronary arteries). These deposits of calcium and plaques can partly clog and narrow the coronary  arteries without producing any symptoms or warning signs. This puts a person at risk for a heart attack. This test can detect these deposits before symptoms develop. Tell a health care provider about: Any allergies you have. All medicines you are taking, including vitamins, herbs, eye drops, creams, and over-the-counter medicines. Any problems you or family members have had with anesthetic medicines. Any blood disorders you have. Any surgeries you have had. Any medical conditions you have. Whether you are pregnant or may be pregnant. What are the risks? Generally, this is a safe procedure. However, problems may occur, including: Harm to a pregnant woman and her unborn baby. This test involves the use of radiation. Radiation exposure can be dangerous to a pregnant woman and her unborn baby. If you are pregnant, you generally should not have this procedure done. Slight increase in the risk of cancer. This is because of the radiation involved in the test. What happens before the procedure? No preparation is needed for this procedure. What happens during the procedure? You will undress and remove any jewelry around your neck or chest. You will put on a hospital gown. Sticky electrodes will be placed on your chest. The electrodes will be connected to an electrocardiogram (ECG) machine to record a tracing of the electrical activity of your heart. A CT scanner will take pictures of your heart. During this time, you will be asked to lie still and hold your breath for 2-3 seconds while a picture of your heart is being taken. The procedure may vary among health care providers and hospitals. What happens after the procedure? You can get dressed. You can return to your normal activities. It  is up to you to get the results of your test. Ask your health care provider, or the department that is doing the test, when your results will be ready. Summary A coronary calcium scan is an imaging test used to look  for deposits of calcium and other fatty materials (plaques) in the inner lining of the blood vessels of the heart (coronary arteries). Generally, this is a safe procedure. Tell your health care provider if you are pregnant or may be pregnant. No preparation is needed for this procedure. A CT scanner will take pictures of your heart. You can return to your normal activities after the scan is done. This information is not intended to replace advice given to you by your health care provider. Make sure you discuss any questions you have with your health care provider. Document Released: 12/12/2007 Document Revised: 05/04/2016 Document Reviewed: 05/04/2016 Elsevier Interactive Patient Education  2017 Elsevier Inc.   Christena Deem- Long Term Monitor Instructions  Your physician has requested you wear a ZIO patch monitor for 14 days.  This is a single patch monitor. [employer] supplies one patch monitor per enrollment. Additional stickers are not available. Please do not apply patch if you will be having a Nuclear Stress Test,  Echocardiogram, Cardiac CT, MRI, or Chest Xray during the period you would be wearing the  monitor. The patch cannot be worn during these tests. You cannot remove and re-apply the  ZIO XT patch monitor.  Your ZIO patch monitor will be mailed 3 day USPS to your address on file. It may take 3-5 days  to receive your monitor after you have been enrolled.  Once you have received your monitor, please review the enclosed instructions. Your monitor  has already been registered assigning a specific monitor serial # to you.  Billing and Patient Assistance Program Information  We have supplied [employer] with any of your insurance information on file for billing purposes. [employer] offers a sliding scale Patient Assistance Program for patients that do not have  insurance, or whose insurance does not completely cover the cost of the ZIO monitor.  You must apply for the Patient Assistance Program  to qualify for this discounted rate.  To apply, please call [employer] at [phone], select option 4, select option 2, ask to apply for  Patient Assistance Program. Meredeth Ide will ask your household income, and how many people  are in your household. They will quote your out-of-pocket cost based on that information.  [employer] will also be able to set up a 54-month, interest-free payment plan if needed.  Applying the monitor   Shave hair from upper left chest.  Hold abrader disc by orange tab. Rub abrader in 40 strokes over the upper left chest as  indicated in your monitor instructions.  Clean area with 4 enclosed alcohol pads. Let dry.  Apply patch as indicated in monitor instructions. Patch will be placed under collarbone on left  side of chest with arrow pointing upward.  Rub patch adhesive wings for 2 minutes. Remove white label marked "1". Remove the white  label marked "2". Rub patch adhesive wings for 2 additional minutes.  While looking in a mirror, press and release button in center of patch. A small green light will  flash 3-4 times. This will be your only indicator that the monitor has been turned on.   Do not shower for the first 24 hours. You may shower after the first 24 hours.   Press the button if you feel a  symptom. You will hear a small click. Record Date, Time and  Symptom in the Patient Logbook.  When you are ready to remove the patch, follow instructions on the last 2 pages of Patient  Logbook. Stick patch monitor onto the last page of Patient Logbook.  Place Patient Logbook in the blue and white box. Use locking tab on box and tape box closed  securely. The blue and white box has prepaid postage on it. Please place it in the mailbox as  soon as possible. Your physician should have your test results approximately 7 days after the  monitor has been mailed back to [facility].  Call [facility] Customer Care at [phone] if you have questions regarding  your  ZIO XT patch monitor. Call them immediately if you see an orange light blinking on your  monitor.  If your monitor falls off in less than 4 days, contact our Monitor department at [phone].  If your monitor becomes loose or falls off after 4 days call [employer] at [phone] for  suggestions on securing your monitor   Follow-Up: At [facility], you and your health needs are our priority.  As part of our continuing mission to provide you with exceptional heart care, we have created designated Provider Care Teams.  These Care Teams include your primary Cardiologist (physician) and Advanced Practice Providers (APPs -  Physician Assistants and Nurse Practitioners) who all work together to provide you with the care you need, when you need it.  We recommend signing up for the patient portal called "MyChart".  Sign up information is provided on this After Visit Summary.  MyChart is used to connect with patients for Virtual Visits (Telemedicine).  Patients are able to view lab/test results, encounter notes, upcoming appointments, etc.  Non-urgent messages can be sent to your provider as well.   To learn more about what you can do with MyChart, go to [email or web address].    Your next appointment:   3 month(s)  Provider:   Nanetta Batty, MD

## 2023-06-08 NOTE — Progress Notes (Signed)
06/08/2023 Katelyn Cline   1961/02/21  [id number]  Primary Physician [name], MD Primary Cardiologist: Runell Gess MD Nicholes Calamity, [place]  HPI:  Katelyn Cline is a 62 y.o. moderately overweight divorced Caucasian female mother of 1 son who was referred to me by Dr. Ruthine Dose, her PCP, for atrial fibrillation.  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  She was disabled because of panic attacks and agoraphobia.  Her risk factors include greater than 100 pack years of tobacco abuse having smoked 2 packs a day since she was 62 years old, currently smoking 1/2 pack/day.  She has been on supplemental oxygen 24/7 for the last 12 years.  She has a diagnosis of COPD.  Her other risk factors include treated diabetes, hypertension and hyperlipidemia.  She is a strong family history for heart disease with a mother who had stent, father had bypass surgery and a brother who had a myocardial infarction.  She is apparently down in [place] on vacation in [place] in September and was awakened with shortness of breath.  She was evaluated at [facility] although the specifics of the hospitalization are unclear.  She was told that she was in "heart failure" and initially they recommended cardiac catheterization but this was never performed.  She really denies chest pain.   Current Meds  Medication Sig   aspirin EC 81 MG tablet Take 1 tablet (81 mg total) by mouth daily. Swallow whole.   atorvastatin (LIPITOR) 80 MG tablet TAKE ONE TABLET BY MOUTH EVERYDAY AT BEDTIME   Blood Glucose Monitoring Suppl (BLOOD GLUCOSE SYSTEM PAK) KIT Please dispense based on patient and insurance preference. Use as directed to monitor FSBS 2x daily. Dx: E11.9   buPROPion ER (WELLBUTRIN SR) 100 MG 12 hr tablet TAKE ONE TABLET BY MOUTH EVERY MORNING and TAKE ONE TABLET BY MOUTH EVERYDAY AT BEDTIME   diltiazem (CARDIZEM CD) 120 MG 24 hr capsule Take 1 capsule (120  mg total) by mouth daily.   DULoxetine (CYMBALTA) 60 MG capsule 1 tab po bid   ELIQUIS 5 MG TABS tablet Take 1 tablet (5 mg total) by mouth 2 (two) times daily.   ezetimibe (ZETIA) 10 MG tablet Take 1 tablet (10 mg total) by mouth every morning.   fenofibrate (TRICOR) 145 MG tablet Take 1 tablet (145 mg total) by mouth daily.   fish oil-omega-3 fatty acids 1000 MG capsule Take 2 capsules (2 g total) by mouth 2 (two) times daily.   furosemide (LASIX) 20 MG tablet Take 1 tablet (20 mg total) by mouth daily as needed.   glucose blood (ACCU-CHEK GUIDE) test strip Use as instructed   Glucose Blood (BLOOD GLUCOSE TEST STRIPS) STRP Please dispense based on patient and insurance preference. Use as directed to monitor FSBS 2x daily. Dx: E11.9   hydrochlorothiazide (HYDRODIURIL) 25 MG tablet Take 1 tablet (25 mg total) by mouth every morning.   hydroxyurea (HYDREA) 500 MG capsule TAKE TWO CAPSULES BY MOUTH EVERYDAY AT BEDTIME   lisinopril (ZESTRIL) 5 MG tablet TAKE ONE TABLET BY MOUTH EVERYDAY AT BEDTIME   metFORMIN (GLUCOPHAGE) 1000 MG tablet TAKE ONE TABLET BY MOUTH EVERY MORNING and TAKE ONE TABLET BY MOUTH EVERYDAY AT BEDTIME   Microlet Lancets MISC Please dispense based on patient and insurance preference. Use as directed to monitor FSBS 2x daily. Dx: E11.9   naloxone (NARCAN) nasal spray 4 mg/0.1 mL  nystatin (MYCOSTATIN/NYSTOP) powder Apply 1 Application topically 3 (three) times daily. PA Case: [id number] Approved 06/30/2019- 06/28/2020   nystatin cream (MYCOSTATIN) Apply topically 2 (two) times daily.   oxyCODONE-acetaminophen (PERCOCET) 10-325 MG tablet Take 1 tablet by mouth 4 (four) times daily as needed for severe pain.   potassium chloride SA (KLOR-CON M) 20 MEQ tablet Take 1 tablet (20 mEq total) by mouth daily as needed. Take if take furosemide   senna (SENOKOT) 8.6 MG TABS tablet Take 2 tablets by mouth daily as needed for mild constipation.   Vitamin D, Ergocalciferol, (DRISDOL) 1.25 MG  (50000 UNIT) CAPS capsule TAKE ONE TABLET BY MOUTH on sundays at bedtime     Allergies  Allergen Reactions   Contrast Media [Iodinated Contrast Media] Anaphylaxis   Erythromycin     Stomach pain   Flagyl [Metronidazole] Other (See Comments)    Generalized pain.   Latex Other (See Comments)    Was told to be careful because of Dye allergy   Other     Iodine Dye   Penicillins Other (See Comments)    Patient was an infant, no idea of reaction. Tolerates Keflex. Did it involve swelling of the face/tongue/throat, SOB, or low BP? Unknown Did it involve sudden or severe rash/hives, skin peeling, or any reaction on the inside of your mouth or nose? Unknown Did you need to seek medical attention at a hospital or doctor's office? Unknown When did it last happen?      infant If all above answers are "NO", may proceed with cephalosporin use.    Tetracyclines & Related Other (See Comments)    GI side effects    Social History   Socioeconomic History   Marital status: Divorced    Spouse name: Not on file   Number of children: 1   Years of education: 12+   Highest education level: Not on file  Occupational History   Occupation: disabled  Tobacco Use   Smoking status: Every Day    Current packs/day: 0.50    Average packs/day: 0.5 packs/day for 50.0 years (25.0 ttl pk-yrs)    Types: Cigarettes   Smokeless tobacco: Never   Tobacco comments:    Quit a few times  Vaping Use   Vaping status: Former   Quit date: 09/15/2016  Substance and Sexual Activity   Alcohol use: Yes    Alcohol/week: 1.0 standard drink of alcohol    Types: 1 Standard drinks or equivalent per week    Comment: Not weekly   Drug use: Never   Sexual activity: Not Currently    Birth control/protection: Surgical  Other Topics Concern   Not on file  Social History Narrative   Son-lives w/son.   disabled   Social Determinants of Health   Financial Resource Strain: Low Risk  (12/24/2022)   Overall Financial  Resource Strain (CARDIA)    Difficulty of Paying Living Expenses: Not hard at all  Food Insecurity: No Food Insecurity (12/24/2022)   Hunger Vital Sign    Worried About Running Out of Food in the Last Year: Never true    Ran Out of Food in the Last Year: Never true  Transportation Needs: No Transportation Needs (12/24/2022)   PRAPARE - Administrator, Civil Service (Medical): No    Lack of Transportation (Non-Medical): No  Physical Activity: Inactive (12/24/2022)   Exercise Vital Sign    Days of Exercise per Week: 0 days    Minutes of Exercise per Session: 0 min  Stress: Stress Concern Present (12/24/2022)   [employer] of Occupational Health - Occupational Stress Questionnaire    Feeling of Stress : Rather much  Social Connections: Socially Isolated (12/24/2022)   Social Connection and Isolation Panel [NHANES]    Frequency of Communication with Friends and Family: Once a week    Frequency of Social Gatherings with Friends and Family: Once a week    Attends Religious Services: Never    Database administrator or Organizations: No    Attends Banker Meetings: Never    Marital Status: Divorced  Catering manager Violence: Not At Risk (12/24/2022)   Humiliation, Afraid, Rape, and Kick questionnaire    Fear of Current or Ex-Partner: No    Emotionally Abused: No    Physically Abused: No    Sexually Abused: No     Review of Systems: General: negative for chills, fever, night sweats or weight changes.  Cardiovascular: negative for chest pain, dyspnea on exertion, edema, orthopnea, palpitations, paroxysmal nocturnal dyspnea or shortness of breath Dermatological: negative for rash Respiratory: negative for cough or wheezing Urologic: negative for hematuria Abdominal: negative for nausea, vomiting, diarrhea, bright red blood per rectum, melena, or hematemesis Neurologic: negative for visual changes, syncope, or dizziness All other systems reviewed and are  otherwise negative except as noted above.    Blood pressure 120/64, pulse 77, height 5\' 9"  (1.753 m), weight 207 lb (93.9 kg), SpO2 92%.  General appearance: alert and no distress Neck: no adenopathy, no carotid bruit, no JVD, supple, symmetrical, trachea midline, and thyroid not enlarged, symmetric, no tenderness/mass/nodules Lungs: clear to auscultation bilaterally Heart: irregularly irregular rhythm Extremities: extremities normal, atraumatic, no cyanosis or edema Pulses: 2+ and symmetric Skin: Skin color, texture, turgor normal. No rashes or lesions Neurologic: Grossly normal  EKG EKG Interpretation Date/Time:  Tuesday June 08 2023 11:16:35 EST Ventricular Rate:  77 PR Interval:    QRS Duration:  96 QT Interval:  384 QTC Calculation: 434 R Axis:   4  Text Interpretation: Atrial fibrillation Septal infarct , age undetermined When compared with ECG of 11-Mar-2018 12:05, PREVIOUS ECG IS PRESENT Confirmed by Nanetta Batty [phone]) on 06/08/2023 11:34:31 AM    ASSESSMENT AND PLAN:   Hyperlipidemia History of hyperlipidemia on statin therapy, and Zetia with lipid profile performed 11/17/2022 revealing a total cholesterol 180, LDL 72 and HDL 39.  Tobacco use disorder History of tobacco abuse having smoked more than 100 pack years averaging 2 packs a day for last 55 years currently smoking 1/2 pack a day.  She is on supplemental oxygen 24/7 for the last 12 years and has COPD.  Essential hypertension, benign History of essential hypertension blood pressure measured today at 120/64.  She is on diltiazem, hydrochlorothiazide and lisinopril.  Atrial fibrillation [facility]) Patient was referred for atrial fibrillation.  She thinks that she has had PAF for the last 6 years although recently it appears to be more chronic..This patients CHA2DS2-VASc Score and unadjusted Ischemic Stroke Rate (% per year) is equal to 3.2 % stroke rate/year from a score of 3  Above score calculated as 1 point  each if present [CHF, HTN, DM, Vascular=MI/PAD/Aortic Plaque, Age if 65-74, or Female] Above score calculated as 2 points each if present [Age > 75, or Stroke/TIA/TE]   She currently is on Eliquis oral anticoagulation and diltiazem for rate control.     Runell Gess MD FACP,FACC,FAHA, [facility] 06/08/2023 11:55 AM

## 2023-06-08 NOTE — Telephone Encounter (Signed)
Patient says she discussed bringing up some papers for the doctor to look over at todays appointment  In providers box

## 2023-06-08 NOTE — Assessment & Plan Note (Signed)
History of tobacco abuse having smoked more than 100 pack years averaging 2 packs a day for last 55 years currently smoking 1/2 pack a day.  She is on supplemental oxygen 24/7 for the last 12 years and has COPD.

## 2023-06-08 NOTE — Assessment & Plan Note (Addendum)
History of hyperlipidemia on statin therapy, and Zetia with lipid profile performed 11/17/2022 revealing a total cholesterol 180, LDL 72 and HDL 39.

## 2023-06-08 NOTE — Progress Notes (Unsigned)
Enrolled for [employer] to mail a ZIO XT long term holter monitor to the patients address on file.  

## 2023-06-08 NOTE — Assessment & Plan Note (Signed)
History of essential hypertension blood pressure measured today at 120/64.  She is on diltiazem, hydrochlorothiazide and lisinopril.

## 2023-06-10 ENCOUNTER — Ambulatory Visit (HOSPITAL_COMMUNITY)
Admission: RE | Admit: 2023-06-10 | Discharge: 2023-06-10 | Disposition: A | Discharge status: Home / Self Care | Payer: Medicare HMO | Source: Ambulatory Visit | Attending: Physician Assistant | Admitting: Physician Assistant

## 2023-06-10 ENCOUNTER — Encounter (HOSPITAL_COMMUNITY): Payer: Self-pay | Admitting: Physician Assistant

## 2023-06-10 VITALS — BP 160/80 | HR 73 | Ht 69.0 in | Wt 210.2 lb

## 2023-06-10 DIAGNOSIS — Z79899 Other long term (current) drug therapy: Secondary | ICD-10-CM | POA: Insufficient documentation

## 2023-06-10 DIAGNOSIS — I10 Essential (primary) hypertension: Secondary | ICD-10-CM | POA: Insufficient documentation

## 2023-06-10 DIAGNOSIS — G4733 Obstructive sleep apnea (adult) (pediatric): Secondary | ICD-10-CM | POA: Insufficient documentation

## 2023-06-10 DIAGNOSIS — J449 Chronic obstructive pulmonary disease, unspecified: Secondary | ICD-10-CM | POA: Insufficient documentation

## 2023-06-10 DIAGNOSIS — Z7984 Long term (current) use of oral hypoglycemic drugs: Secondary | ICD-10-CM | POA: Insufficient documentation

## 2023-06-10 DIAGNOSIS — F17210 Nicotine dependence, cigarettes, uncomplicated: Secondary | ICD-10-CM | POA: Insufficient documentation

## 2023-06-10 DIAGNOSIS — D6869 Other thrombophilia: Secondary | ICD-10-CM | POA: Diagnosis not present

## 2023-06-10 DIAGNOSIS — I4819 Other persistent atrial fibrillation: Secondary | ICD-10-CM | POA: Insufficient documentation

## 2023-06-10 DIAGNOSIS — E1142 Type 2 diabetes mellitus with diabetic polyneuropathy: Secondary | ICD-10-CM | POA: Diagnosis not present

## 2023-06-10 DIAGNOSIS — E782 Mixed hyperlipidemia: Secondary | ICD-10-CM | POA: Diagnosis not present

## 2023-06-10 DIAGNOSIS — Z7901 Long term (current) use of anticoagulants: Secondary | ICD-10-CM | POA: Insufficient documentation

## 2023-06-10 NOTE — Progress Notes (Signed)
Primary Care Physician: [name], MD Primary Cardiologist: Nanetta Batty, MD Electrophysiologist: None  Referring Physician: Dr Loel Ro is a 62 y.o. female with a history of COPD, DM, HTN, tobacco abuse, HLD, OSA, atrial fibrillation who presents for follow up in the [facility].  The patient reports that she was initially diagnosed with atrial fibrillation in 2018, however she was not started on anticoagulation and no further workup was pursued. She was hospitalized for an unclear reason 02/2023 and was told she was in afib at that time and started on Eliquis.  She was still in afib at her visit with Dr Allyson Sabal 06/08/23. Patient is on Eliquis for a CHADS2VASC score of 3.  On follow up today, patient remains in rate controlled afib. She is not really aware of her arrhythmia. No bleeding issues on anticoagulation. She has a remote diagnosis of OSA but has not been on CPAP for years.   Today, she denies symptoms of palpitations, chest pain, orthopnea, PND, lower extremity edema, dizziness, presyncope, syncope, snoring, daytime somnolence, bleeding, or neurologic sequela. The patient is tolerating medications without difficulties and is otherwise without complaint today.    Atrial Fibrillation Risk Factors:  she does have symptoms or diagnosis of sleep apnea. she does not have a history of rheumatic fever.   Atrial Fibrillation Management history:  Previous antiarrhythmic drugs: none Previous cardioversions: none Previous ablations: none Anticoagulation history: Eliquis  ROS- All systems are reviewed and negative except as per the HPI above.  Past Medical History:  Diagnosis Date   Agoraphobia    Anxiety    Arthritis    "all joints"   Chronic low back pain    Chronic respiratory failure with hypoxia ([name])    4L  via [place],  followed by pcp,   (09-16-2018  per pt only uses while at home and at night ,  portable oxygen is not  working)   Clotting disorder ([name]) 2013   Polycythemia vera   Colon polyps    COPD (chronic obstructive pulmonary disease) ([name])    DDD (degenerative disc disease), lumbosacral    Dental caries    DM type 2 (diabetes mellitus, type 2) ([name])    followed by pcp   Fibromyalgia    GERD (gastroesophageal reflux disease)    occasional , no meds   Heart murmur 2020   History of basal cell carcinoma (BCC) excision    s/p  Moh's of face/ nose 12/ 2013   History of palpitations    per pt found to have occaional PVCs   History of UTI    HTN (hypertension)    Hyperlipidemia    Loose, teeth    Major depression    Metastatic melanoma ([name]) 10/18/2012   12 mm posterior right upper lobe pulmonary nodule, max SUV 3.0  s/p  right wedge resection 10-21-2012,  followed by oncologist-- dr Candise Che,  no recurrence   Mixed stress and urge urinary incontinence    Myeloproliferative neoplasm ([name])    Narcolepsy    Obesity    OSA (obstructive sleep apnea)    Oxygen deficiency    Oxygen dependent    4 L via ,  per pt portable oxygen not working but does use while at home and at night   Panic attacks    Periodontitis    Peripheral neuropathy    feet   Polycythemia vera(238.4) hematology/ oncology-- dr Candise Che ([facility])   first dx  [address] due to smoker/copd--  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 Swallow whole. 100 tablet 4   atorvastatin (LIPITOR) 80 MG tablet TAKE ONE TABLET BY MOUTH EVERYDAY AT BEDTIME 90 tablet 1   Blood Glucose Monitoring Suppl  (BLOOD GLUCOSE SYSTEM PAK) KIT Please dispense based on patient and insurance preference. Use as directed to monitor FSBS 2x daily. Dx: E11.9 1 kit 1   buPROPion ER (WELLBUTRIN SR) 100 MG 12 hr tablet TAKE ONE TABLET BY MOUTH EVERY MORNING and TAKE ONE TABLET BY MOUTH EVERYDAY AT BEDTIME 180 tablet 1   diltiazem (CARDIZEM CD) 120 MG 24 hr capsule Take 1 capsule (120 mg total) by mouth daily. 90 capsule 1   DULoxetine (CYMBALTA) 60 MG capsule 1 tab po bid 180 capsule 1   ELIQUIS 5 MG TABS tablet Take 1 tablet (5 mg total) by mouth 2 (two) times daily. 180 tablet 1   ezetimibe (ZETIA) 10 MG tablet Take 1 tablet (10 mg total) by mouth every morning. 90 tablet 1   fenofibrate (TRICOR) 145 MG tablet Take 1 tablet (145 mg total) by mouth daily. 90 tablet 1   fish oil-omega-3 fatty acids 1000 MG capsule Take 2 capsules (2 g total) by mouth 2 (two) times daily. 180 capsule 1   furosemide (LASIX) 20 MG tablet Take 1 tablet (20 mg total) by mouth daily as needed. 30 tablet 3   glucose blood (ACCU-CHEK GUIDE) test strip Use as instructed 100 each 12   Glucose Blood (BLOOD GLUCOSE TEST STRIPS) STRP Please dispense based on patient and insurance preference. Use as directed to monitor FSBS 2x daily. Dx: E11.9 100 strip 1   hydrochlorothiazide (HYDRODIURIL) 25 MG tablet Take 1 tablet (25 mg total) by mouth every morning. 90 tablet 1   hydroxyurea (HYDREA) 500 MG capsule TAKE TWO CAPSULES BY MOUTH EVERYDAY AT BEDTIME 90 capsule 3   lisinopril (ZESTRIL) 5 MG tablet TAKE ONE TABLET BY MOUTH EVERYDAY AT BEDTIME 90 tablet 1   metFORMIN (GLUCOPHAGE) 1000 MG tablet TAKE ONE TABLET BY MOUTH EVERY MORNING and TAKE ONE TABLET BY MOUTH EVERYDAY AT BEDTIME 180 tablet 1   Microlet Lancets MISC Please dispense based on patient and insurance preference. Use as directed to monitor FSBS 2x daily. Dx: E11.9 100 each 11   naloxone (NARCAN) nasal spray 4 mg/0.1 mL      nystatin (MYCOSTATIN/NYSTOP) powder Apply 1 Application topically  3 (three) times daily. PA Case: [id number] Approved 06/30/2019- 06/28/2020 60 g 2   nystatin cream (MYCOSTATIN) Apply topically 2 (two) times daily. 60 g 2   oxyCODONE-acetaminophen (PERCOCET) 10-325 MG tablet Take 1 tablet by mouth 4 (four) times daily as needed for severe pain.     potassium chloride SA (KLOR-CON M) 20 MEQ tablet Take 1 tablet (20 mEq total) by mouth daily as needed. Take if take furosemide 30 tablet 3   senna (SENOKOT) 8.6 MG TABS tablet Take 2 tablets by mouth daily as needed for mild constipation.  Vitamin D, Ergocalciferol, (DRISDOL) 1.25 MG (50000 UNIT) CAPS capsule TAKE ONE TABLET BY MOUTH on sundays at bedtime 12 capsule 1   XTAMPZA ER 9 MG C12A Take 1 capsule by mouth 2 (two) times daily.     No current facility-administered medications for this encounter.    Physical Exam: BP (!) 160/80   Pulse 73   Ht 5\' 9"  (1.753 m)   Wt 95.3 kg   BMI 31.04 kg/m   GEN: Well nourished, well developed in no acute distress NECK: No JVD; No carotid bruits CARDIAC: Irregularly irregular rate and rhythm, no murmurs, rubs, gallops RESPIRATORY:  mild wheezing RLL ABDOMEN: Soft, non-tender, non-distended EXTREMITIES:  No edema; No deformity   Wt Readings from Last 3 Encounters:  06/10/23 95.3 kg  06/08/23 93.9 kg  04/07/23 94.1 kg     EKG today demonstrates  Afib Vent. rate 73 BPM PR interval * ms QRS duration 100 ms QT/QTcB 384/423 ms     CHA2DS2-VASc Score = 3  The patient's score is based upon: CHF History: 0 HTN History: 1 Diabetes History: 1 Stroke History: 0 Vascular Disease History: 0 Age Score: 0 Gender Score: 1       ASSESSMENT AND PLAN: Persistent Atrial Fibrillation (ICD10:  I48.19) The patient's CHA2DS2-VASc score is 3, indicating a 3.2% annual risk of stroke.   General education about afib provided and questions answered. We also discussed her stroke risk and the risks and benefits of anticoagulation. Patient appears persistently in afib  although difficult to tell chronicity given her lack of symptoms. A cardiac monitor has already been ordered per Dr Allyson Sabal. Will see what her burden is on that monitor.  Continue Eliquis 5 mg BID Continue diltiazem 120 mg daily We discussed reduction/cessation of tobacco, patient motivated to quit. Echocardiogram and calcium scoring scheduled.   Secondary Hypercoagulable State (ICD10:  D68.69) The patient is at significant risk for stroke/thromboembolism based upon her CHA2DS2-VASc Score of 3.  Continue Apixaban (Eliquis).   HTN Stable on current regimen  OSA  Patient has a remote diagnosis of OSA but has not been on CPAP for years. The importance of adequate treatment of sleep apnea was discussed today in order to improve our ability to maintain sinus rhythm long term. Will refer for sleep study/evaluation.     Follow up in the AF clinic in 6 weeks once her testing is completed.        Jorja Loa PA-C Afib Clinic [facility] [address] [place], [place] [address] [phone]

## 2023-06-11 ENCOUNTER — Other Ambulatory Visit: Payer: Self-pay | Admitting: Family Medicine

## 2023-06-11 DIAGNOSIS — E6609 Other obesity due to excess calories: Secondary | ICD-10-CM | POA: Diagnosis not present

## 2023-06-11 DIAGNOSIS — F1120 Opioid dependence, uncomplicated: Secondary | ICD-10-CM | POA: Diagnosis not present

## 2023-06-11 DIAGNOSIS — M5030 Other cervical disc degeneration, unspecified cervical region: Secondary | ICD-10-CM | POA: Diagnosis not present

## 2023-06-11 DIAGNOSIS — M51369 Other intervertebral disc degeneration, lumbar region without mention of lumbar back pain or lower extremity pain: Secondary | ICD-10-CM | POA: Diagnosis not present

## 2023-06-11 DIAGNOSIS — Z6831 Body mass index (BMI) 31.0-31.9, adult: Secondary | ICD-10-CM | POA: Diagnosis not present

## 2023-06-11 DIAGNOSIS — I10 Essential (primary) hypertension: Secondary | ICD-10-CM | POA: Diagnosis not present

## 2023-06-11 DIAGNOSIS — A499 Bacterial infection, unspecified: Secondary | ICD-10-CM | POA: Diagnosis not present

## 2023-06-11 DIAGNOSIS — E119 Type 2 diabetes mellitus without complications: Secondary | ICD-10-CM | POA: Diagnosis not present

## 2023-06-11 DIAGNOSIS — E7800 Pure hypercholesterolemia, unspecified: Secondary | ICD-10-CM | POA: Diagnosis not present

## 2023-06-11 DIAGNOSIS — F17210 Nicotine dependence, cigarettes, uncomplicated: Secondary | ICD-10-CM | POA: Diagnosis not present

## 2023-06-11 DIAGNOSIS — F17200 Nicotine dependence, unspecified, uncomplicated: Secondary | ICD-10-CM | POA: Diagnosis not present

## 2023-06-11 DIAGNOSIS — N390 Urinary tract infection, site not specified: Secondary | ICD-10-CM | POA: Diagnosis not present

## 2023-06-11 DIAGNOSIS — Z79899 Other long term (current) drug therapy: Secondary | ICD-10-CM | POA: Diagnosis not present

## 2023-06-14 ENCOUNTER — Other Ambulatory Visit: Payer: Self-pay

## 2023-06-14 DIAGNOSIS — D45 Polycythemia vera: Secondary | ICD-10-CM

## 2023-06-15 ENCOUNTER — Inpatient Hospital Stay: Payer: Medicare HMO | Attending: Hematology

## 2023-06-15 ENCOUNTER — Inpatient Hospital Stay: Payer: Medicare HMO

## 2023-06-15 ENCOUNTER — Inpatient Hospital Stay (HOSPITAL_BASED_OUTPATIENT_CLINIC_OR_DEPARTMENT_OTHER): Payer: Medicare HMO | Admitting: Hematology

## 2023-06-15 DIAGNOSIS — D471 Chronic myeloproliferative disease: Secondary | ICD-10-CM

## 2023-06-15 DIAGNOSIS — D45 Polycythemia vera: Secondary | ICD-10-CM | POA: Insufficient documentation

## 2023-06-15 DIAGNOSIS — Z79899 Other long term (current) drug therapy: Secondary | ICD-10-CM | POA: Insufficient documentation

## 2023-06-15 LAB — CMP (CANCER CENTER ONLY)
ALT: 8 U/L (ref 0–44)
AST: 10 U/L — ABNORMAL LOW (ref 15–41)
Albumin: 3.9 g/dL (ref 3.5–5.0)
Alkaline Phosphatase: 88 U/L (ref 38–126)
Anion gap: 9 (ref 5–15)
BUN: 17 mg/dL (ref 8–23)
CO2: 30 mmol/L (ref 22–32)
Calcium: 10 mg/dL (ref 8.9–10.3)
Chloride: 101 mmol/L (ref 98–111)
Creatinine: 0.6 mg/dL (ref 0.44–1.00)
GFR, Estimated: >60 mL/min (ref >60)
Glucose, Bld: 194 mg/dL — ABNORMAL HIGH (ref 70–99)
Potassium: 3.8 mmol/L (ref 3.5–5.1)
Sodium: 140 mmol/L (ref 135–145)
Total Bilirubin: 0.4 mg/dL (ref <1.2)
Total Protein: 7 g/dL (ref 6.5–8.1)

## 2023-06-15 LAB — CBC WITH DIFFERENTIAL (CANCER CENTER ONLY)
Abs Immature Granulocytes: 0.24 10*3/uL — ABNORMAL HIGH (ref 0.00–0.07)
Basophils Absolute: 0.2 10*3/uL — ABNORMAL HIGH (ref 0.0–0.1)
Basophils Relative: 1 %
Eosinophils Absolute: 0.3 10*3/uL (ref 0.0–0.5)
Eosinophils Relative: 1 %
HCT: 41.1 % (ref 36.0–46.0)
Hemoglobin: 12 g/dL (ref 12.0–15.0)
Immature Granulocytes: 1 %
Lymphocytes Relative: 5 %
Lymphs Abs: 1 10*3/uL (ref 0.7–4.0)
MCH: 25.3 pg — ABNORMAL LOW (ref 26.0–34.0)
MCHC: 29.2 g/dL — ABNORMAL LOW (ref 30.0–36.0)
MCV: 86.7 fL (ref 80.0–100.0)
Monocytes Absolute: 0.6 10*3/uL (ref 0.1–1.0)
Monocytes Relative: 3 %
Neutro Abs: 16.8 10*3/uL — ABNORMAL HIGH (ref 1.7–7.7)
Neutrophils Relative %: 89 %
Platelet Count: 640 10*3/uL — ABNORMAL HIGH (ref 150–400)
RBC: 4.74 MIL/uL (ref 3.87–5.11)
RDW: 19.3 % — ABNORMAL HIGH (ref 11.5–15.5)
WBC Count: 19.1 10*3/uL — ABNORMAL HIGH (ref 4.0–10.5)
nRBC: 0.2 % (ref 0.0–0.2)

## 2023-06-15 LAB — FERRITIN: Ferritin: 34 ng/mL (ref 11–307)

## 2023-06-15 LAB — IRON AND IRON BINDING CAPACITY (CC-WL,HP ONLY)
Iron: 27 ug/dL — ABNORMAL LOW (ref 28–170)
Saturation Ratios: 5 % — ABNORMAL LOW (ref 10.4–31.8)
TIBC: 536 ug/dL — ABNORMAL HIGH (ref 250–450)
UIBC: 509 ug/dL — ABNORMAL HIGH (ref 148–442)

## 2023-06-15 MED ORDER — HYDROXYUREA 500 MG PO CAPS: ORAL_CAPSULE | ORAL | 3 refills | Status: DC

## 2023-06-15 NOTE — Progress Notes (Signed)
HEMATOLOGY/ONCOLOGY CLINIC NOTE  Date of Service: 06/15/23   Patient Care Team: [name], MD as PCP - General (Family Medicine) Runell Gess, MD as PCP - Cardiology (Cardiology) [name], MD as Consulting Physician (Hematology) Erroll Luna, [facility] (Inactive) as Pharmacist (Pharmacist) Mateo Flow, MD as Consulting Physician (Ophthalmology) Runell Gess, MD as Consulting Physician (Cardiology)  CHIEF COMPLAINTS/PURPOSE OF CONSULTATION:  Evaluation and management of polycythemia vera  HISTORY OF PRESENTING ILLNESS:  Please see previous note for details of initial presentation  INTERVAL HISTORY:   Katelyn Cline is here for continued evaluation and management of her polycythemia vera.   Patient was last seen by me on 02/19/2023 and she complained of neck pain, headache, and intermittent nausea.   Patient notes she has been doing fairly well since our last visit. Patient notes that she was hospitalized in [place], [place], in September when she was visiting her brother. She had cardiac catheterization. She is unsure of her diagnosis.   Patient has been prescribed blood thinner, Eliquis, since she had Atrial fibrillation. She still takes baby aspirin as well.    She regularly takes her current hydroxyurea dosage. She takes 2 hydroxyurea tablets everyday.   Patient has been recently diagnosed with UTI and she has been prescribed antibiotic.   Patient has decreased smoking.   She denies any new infection issues, fever, chills, SOB, night sweats, unexpected weight loss, chest pain, back pain, abdominal pain, or leg swelling.    MEDICAL HISTORY:  Past Medical History:  Diagnosis Date   Agoraphobia    Anxiety    Arthritis    "all joints"   Chronic low back pain    Chronic respiratory failure with hypoxia ([name])    4L  via [place],  followed by pcp,   (09-16-2018  per pt only uses while at home and at night ,  portable oxygen is not working)    Clotting disorder ([name]) 2013   Polycythemia vera   Colon polyps    COPD (chronic obstructive pulmonary disease) ([name])    DDD (degenerative disc disease), lumbosacral    Dental caries    DM type 2 (diabetes mellitus, type 2) ([name])    followed by pcp   Fibromyalgia    GERD (gastroesophageal reflux disease)    occasional , no meds   Heart murmur 2020   History of basal cell carcinoma (BCC) excision    s/p  Moh's of face/ nose 12/ 2013   History of palpitations    per pt found to have occaional PVCs   History of UTI    HTN (hypertension)    Hyperlipidemia    Loose, teeth    Major depression    Metastatic melanoma ([name]) 10/18/2012   12 mm posterior right upper lobe pulmonary nodule, max SUV 3.0  s/p  right wedge resection 10-21-2012,  followed by oncologist-- dr Candise Che,  no recurrence   Mixed stress and urge urinary incontinence    Myeloproliferative neoplasm ([name])    Narcolepsy    Obesity    OSA (obstructive sleep apnea)    Oxygen deficiency    Oxygen dependent    4 L via [place],  per pt portable oxygen not working but does use while at home and at night   Panic attacks    Periodontitis    Peripheral neuropathy    feet   Polycythemia vera(238.4) hematology/ oncology-- dr Candise Che ([facility])   first dx 02014 due to smoker/copd--  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)  hx phlebotomies   Pulmonary nodules    bilateral small stable per CT 03-11-2018   Scoliosis    Sleep apnea    Smokers' cough ([name])    per pt productive a little in the morning's   Symptoms of upper respiratory infection (URI) 03/12/2018   Urinary (tract) obstruction    Wears glasses     SURGICAL HISTORY: Past Surgical History:  Procedure Laterality Date   ABDOMINAL HYSTERECTOMY  1998   partial   BREAST LUMPECTOMY Bilateral 1995   benign per pt   COLONOSCOPY W/ BIOPSIES AND POLYPECTOMY     Hx: of   CYSTECTOMY  2000   abdominal wall    DILATION AND CURETTAGE OF UTERUS  yrs ago    MOHS SURGERY  2013   nose/face   MULTIPLE EXTRACTIONS WITH ALVEOLOPLASTY N/A 09/19/2018   Procedure: Extraction of tooth #'s 2, 3, 6-14, 18, and 20-30 with alveoloplasty;  Surgeon: Charlynne Pander, DDS;  Location: [facility];  Service: Oral Surgery;  Laterality: N/A;  GENERAL WITH NASAL TUBE   VIDEO ASSISTED THORACOSCOPY (VATS)/WEDGE RESECTION Right 10/21/2012   Procedure: VIDEO ASSISTED THORACOSCOPY (VATS)/WEDGE RESECTION;  Surgeon: Delight Ovens, MD;  Location: [facility] OR;  Service: Thoracic;  Laterality: Right;   VIDEO BRONCHOSCOPY N/A 10/21/2012   Procedure: VIDEO BRONCHOSCOPY;  Surgeon: Delight Ovens, MD;  Location: [facility] OR;  Service: Thoracic;  Laterality: N/A;    SOCIAL HISTORY: Social History   Socioeconomic History   Marital status: Divorced    Spouse name: Not on file   Number of children: 1   Years of education: 12+   Highest education level: Not on file  Occupational History   Occupation: disabled  Tobacco Use   Smoking status: Every Day    Current packs/day: 0.50    Average packs/day: 0.5 packs/day for 50.0 years (25.0 ttl pk-yrs)    Types: Cigarettes   Smokeless tobacco: Never   Tobacco comments:    Current smoker 10 cigarettes daily 06/10/23  Vaping Use   Vaping status: Former   Quit date: 09/15/2016  Substance and Sexual Activity   Alcohol use: Yes    Alcohol/week: 1.0 standard drink of alcohol    Types: 1 Standard drinks or equivalent per week    Comment: Not weekly   Drug use: Never   Sexual activity: Not Currently    Birth control/protection: Surgical  Other Topics Concern   Not on file  Social History Narrative   Son-lives w/son.   disabled   Social Drivers of Corporate investment banker Strain: Low Risk  (12/24/2022)   Overall Financial Resource Strain (CARDIA)    Difficulty of Paying Living Expenses: Not hard at all  Food Insecurity: No Food Insecurity (12/24/2022)   Hunger Vital Sign    Worried About Running Out of Food in the Last Year:  Never true    Ran Out of Food in the Last Year: Never true  Transportation Needs: No Transportation Needs (12/24/2022)   PRAPARE - Administrator, Civil Service (Medical): No    Lack of Transportation (Non-Medical): No  Physical Activity: Inactive (12/24/2022)   Exercise Vital Sign    Days of Exercise per Week: 0 days    Minutes of Exercise per Session: 0 min  Stress: Stress Concern Present (12/24/2022)   [employer] of Occupational Health - Occupational Stress Questionnaire    Feeling of Stress : Rather much  Social Connections: Socially Isolated (12/24/2022)  Social Advertising account executive [NHANES]    Frequency of Communication with Friends and Family: Once a week    Frequency of Social Gatherings with Friends and Family: Once a week    Attends Religious Services: Never    Database administrator or Organizations: No    Attends Banker Meetings: Never    Marital Status: Divorced  Catering manager Violence: Not At Risk (12/24/2022)   Humiliation, Afraid, Rape, and Kick questionnaire    Fear of Current or Ex-Partner: No    Emotionally Abused: No    Physically Abused: No    Sexually Abused: No    FAMILY HISTORY: Family History  Problem Relation Age of Onset   Arthritis Mother    Hyperlipidemia Mother    Depression Mother    Anxiety disorder Mother    Dementia Mother    Hypertension Father    Hyperlipidemia Father    Heart disease Father    Stroke Father    Dementia Father    Heart disease Brother    ADD / ADHD Son    Alcohol abuse Maternal Grandfather    Bipolar disorder Neg Hx    Drug abuse Neg Hx    OCD Neg Hx    Paranoid behavior Neg Hx    Schizophrenia Neg Hx    Seizures Neg Hx    Sexual abuse Neg Hx    Physical abuse Neg Hx     ALLERGIES:  is allergic to contrast media [iodinated contrast media], erythromycin, flagyl [metronidazole], latex, other, penicillins, and tetracyclines & related.  MEDICATIONS:  Current Outpatient  Medications  Medication Sig Dispense Refill   aspirin EC 81 MG tablet Take 1 tablet (81 mg total) by mouth daily. Swallow whole. 100 tablet 4   atorvastatin (LIPITOR) 80 MG tablet TAKE ONE TABLET BY MOUTH EVERYDAY AT BEDTIME 90 tablet 1   Blood Glucose Monitoring Suppl (BLOOD GLUCOSE SYSTEM PAK) KIT Please dispense based on patient and insurance preference. Use as directed to monitor FSBS 2x daily. Dx: E11.9 1 kit 1   buPROPion ER (WELLBUTRIN SR) 100 MG 12 hr tablet TAKE ONE TABLET BY MOUTH EVERY MORNING and TAKE ONE TABLET BY MOUTH EVERYDAY AT BEDTIME 180 tablet 1   diltiazem (CARDIZEM CD) 120 MG 24 hr capsule Take 1 capsule (120 mg total) by mouth daily. 90 capsule 1   DULoxetine (CYMBALTA) 60 MG capsule 1 tab po bid 180 capsule 1   ELIQUIS 5 MG TABS tablet Take 1 tablet (5 mg total) by mouth 2 (two) times daily. 180 tablet 1   ezetimibe (ZETIA) 10 MG tablet Take 1 tablet (10 mg total) by mouth every morning. 90 tablet 1   fenofibrate (TRICOR) 145 MG tablet Take 1 tablet (145 mg total) by mouth daily. 90 tablet 1   fish oil-omega-3 fatty acids 1000 MG capsule Take 2 capsules (2 g total) by mouth 2 (two) times daily. 180 capsule 1   furosemide (LASIX) 20 MG tablet Take 1 tablet (20 mg total) by mouth daily as needed. 30 tablet 3   gabapentin (NEURONTIN) 100 MG capsule Take 3 capsules by mouth three times daily as needed. 270 capsule 2   glucose blood (ACCU-CHEK GUIDE) test strip Use as instructed 100 each 12   Glucose Blood (BLOOD GLUCOSE TEST STRIPS) STRP Please dispense based on patient and insurance preference. Use as directed to monitor FSBS 2x daily. Dx: E11.9 100 strip 1   hydrochlorothiazide (HYDRODIURIL) 25 MG tablet Take 1 tablet (25 mg  total) by mouth every morning. 90 tablet 1   hydroxyurea (HYDREA) 500 MG capsule TAKE TWO CAPSULES BY MOUTH EVERYDAY AT BEDTIME 90 capsule 3   lisinopril (ZESTRIL) 5 MG tablet TAKE ONE TABLET BY MOUTH EVERYDAY AT BEDTIME 90 tablet 1   metFORMIN  (GLUCOPHAGE) 1000 MG tablet TAKE ONE TABLET BY MOUTH EVERY MORNING and TAKE ONE TABLET BY MOUTH EVERYDAY AT BEDTIME 180 tablet 1   Microlet Lancets MISC Please dispense based on patient and insurance preference. Use as directed to monitor FSBS 2x daily. Dx: E11.9 100 each 11   naloxone (NARCAN) nasal spray 4 mg/0.1 mL      nystatin (MYCOSTATIN/NYSTOP) powder Apply 1 Application topically 3 (three) times daily. PA Case: [id number] Approved 06/30/2019- 06/28/2020 60 g 2   nystatin cream (MYCOSTATIN) Apply topically 2 (two) times daily. 60 g 2   oxyCODONE-acetaminophen (PERCOCET) 10-325 MG tablet Take 1 tablet by mouth 4 (four) times daily as needed for severe pain.     potassium chloride SA (KLOR-CON M) 20 MEQ tablet Take 1 tablet (20 mEq total) by mouth daily as needed. Take if take furosemide 30 tablet 3   senna (SENOKOT) 8.6 MG TABS tablet Take 2 tablets by mouth daily as needed for mild constipation.     Vitamin D, Ergocalciferol, (DRISDOL) 1.25 MG (50000 UNIT) CAPS capsule TAKE ONE TABLET BY MOUTH on sundays at bedtime 12 capsule 1   XTAMPZA ER 9 MG C12A Take 1 capsule by mouth 2 (two) times daily.     No current facility-administered medications for this visit.    REVIEW OF SYSTEMS:    10 Point review of Systems was done is negative except as noted above.   PHYSICAL EXAMINATION:  .BP (!) 149/85 (BP Location: Left Arm, Patient Position: Sitting) Comment: Nurse notified  Pulse 82   Temp 97.7 F (36.5 C) (Temporal)   Resp 16   Wt 201 lb 3.2 oz (91.3 kg)   SpO2 95%   BMI 29.71 kg/m   GENERAL:alert, in no acute distress and comfortable SKIN: no acute rashes, no significant lesions EYES: conjunctiva are pink and non-injected, sclera anicteric OROPHARYNX: MMM, no exudates, no oropharyngeal erythema or ulceration NECK: supple, no JVD LYMPH:  no palpable lymphadenopathy in the cervical, axillary or inguinal regions LUNGS: clear to auscultation b/l with normal respiratory effort HEART:  regular rate & rhythm ABDOMEN:  normoactive bowel sounds , non tender, not distended. Extremity: no pedal edema PSYCH: alert & oriented x 3 with fluent speech NEURO: no focal motor/sensory deficits   LABORATORY DATA:  I have reviewed the data as listed  .    Latest Ref Rng & Units 06/15/2023   10:36 AM 02/19/2023   11:59 AM 11/10/2022   11:34 AM  CBC  WBC 4.0 - 10.5 K/uL 19.1  8.6  9.3   Hemoglobin 12.0 - 15.0 g/dL 54.0  98.1  19.1   Hematocrit 36.0 - 46.0 % 41.1  46.1  46.9   Platelets 150 - 400 K/uL 640  427  303    . CBC    Component Value Date/Time   WBC 19.1 (H) 06/15/2023 1036   WBC 20.6 (H) 10/29/2021 0934   RBC 4.74 06/15/2023 1036   HGB 12.0 06/15/2023 1036   HCT 41.1 06/15/2023 1036   PLT 640 (H) 06/15/2023 1036   MCV 86.7 06/15/2023 1036   MCH 25.3 (L) 06/15/2023 1036   MCHC 29.2 (L) 06/15/2023 1036   RDW 19.3 (H) 06/15/2023 1036   LYMPHSABS 1.0  06/15/2023 1036   MONOABS 0.6 06/15/2023 1036   EOSABS 0.3 06/15/2023 1036   BASOSABS 0.2 (H) 06/15/2023 1036     .    Latest Ref Rng & Units 06/15/2023   10:36 AM 02/19/2023   11:59 AM 11/10/2022   11:34 AM  CMP  Glucose 70 - 99 mg/dL 161  096  045   BUN 8 - 23 mg/dL 17  11  26    Creatinine 0.44 - 1.00 mg/dL 4.09  8.11  9.14   Sodium 135 - 145 mmol/L 140  136  139   Potassium 3.5 - 5.1 mmol/L 3.8  4.0  4.2   Chloride 98 - 111 mmol/L 101  102  103   CO2 22 - 32 mmol/L 30  28  29    Calcium 8.9 - 10.3 mg/dL 78.2  9.2  9.4   Total Protein 6.5 - 8.1 g/dL 7.0  7.2  7.1   Total Bilirubin <1.2 mg/dL 0.4  0.5  0.3   Alkaline Phos 38 - 126 U/L 88  77  89   AST 15 - 41 U/L 10  12  12    ALT 0 - 44 U/L 8  9  11     08/15/12 JAK2 Mutation:     03/15/18 BM Bx:      RADIOGRAPHIC STUDIES: I have personally reviewed the radiological images as listed and agreed with the findings in the report. No results found.  ASSESSMENT & PLAN:   62 y.o. female with  1. Jak2 V617F mutation positive Myeloproliferative  syndrome.- BM Bx consistent with Polycythemia Vera  JAK2 mutation identified in 08/15/12 labs  03/15/18 BM Bx revealed results consistent with JAK2 positive polycythemia vera  Labs done today show hemoglobin of 11.8, WBC count of 21.2k and platelets of 1k  2. H/o Medication/followup Non compliance  3. COPD - O2 dependant. Still smoking 1/2ppd  4. History of Metastatic melanoma to the right lung  S/p resection by Dr. Tyrone Sage on 10/21/12, BRAF mutation not detected. -continue q93monthly skin screening her PCP/dermatologist -symptom directed radiologic scanning.   5.  Past Medical History:  Diagnosis Date   Agoraphobia    Anxiety    Arthritis    "all joints"   Chronic low back pain    Chronic respiratory failure with hypoxia ([name])    4L  via [place],  followed by pcp,   (09-16-2018  per pt only uses while at home and at night ,  portable oxygen is not working)   Clotting disorder ([name]) 2013   Polycythemia vera   Colon polyps    COPD (chronic obstructive pulmonary disease) ([name])    DDD (degenerative disc disease), lumbosacral    Dental caries    DM type 2 (diabetes mellitus, type 2) ([name])    followed by pcp   Fibromyalgia    GERD (gastroesophageal reflux disease)    occasional , no meds   Heart murmur 2020   History of basal cell carcinoma (BCC) excision    s/p  Moh's of face/ nose 12/ 2013   History of palpitations    per pt found to have occaional PVCs   History of UTI    HTN (hypertension)    Hyperlipidemia    Loose, teeth    Major depression    Metastatic melanoma ([name]) 10/18/2012   12 mm posterior right upper lobe pulmonary nodule, max SUV 3.0  s/p  right wedge resection 10-21-2012,  followed by oncologist-- dr Candise Che,  no recurrence   Mixed  stress and urge urinary incontinence    Myeloproliferative neoplasm ([name])    Narcolepsy    Obesity    OSA (obstructive sleep apnea)    Oxygen deficiency    Oxygen dependent    4 L via [place],  per pt portable oxygen not working but does  use while at home and at night   Panic attacks    Periodontitis    Peripheral neuropathy    feet   Polycythemia vera(238.4) hematology/ oncology-- dr Candise Che ([facility])   first dx 02014 due to smoker/copd--  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)  hx phlebotomies   Pulmonary nodules    bilateral small stable per CT 03-11-2018   Scoliosis    Sleep apnea    Smokers' cough ([name])    per pt productive a little in the morning's   Symptoms of upper respiratory infection (URI) 03/12/2018   Urinary (tract) obstruction    Wears glasses    PLAN: -Discussed lab results from today, 06/15/2023, in detail with the patient. CBC shows elevated WBC at 19.1 K and elevated platelets at 640 K. CMP shows AST of 10. -Recommend to follow-up with her Cardiologist for managing Afib, Eliquis, and if she should discontinue Afib. -Advised patient to take current dose of Hydroxyurea: 3 tablets on Monday and Thursday and 2 tablets all other days of the week.  -Patient does not require phlebotomy today.   FOLLOW UP: RTC with Dr Candise Che with labs in 3 months  The total time spent in the appointment was 23 minutes* .  All of the patient's questions were answered with apparent satisfaction. The patient knows to call the clinic with any problems, questions or concerns.   Wyvonnia Lora MD MS AAHIVMS [facility] Hematology/Oncology Physician [facility]  .*Total Encounter Time as defined by the Centers for Medicare and Medicaid Services includes, in addition to the face-to-face time of a patient visit (documented in the note above) non-face-to-face time: obtaining and reviewing outside history, ordering and reviewing medications, tests or procedures, care coordination (communications with other health care professionals or caregivers) and documentation in the medical record.   I,Param Shah,acting as a Neurosurgeon for Wyvonnia Lora, MD.,have documented all relevant documentation on the behalf  of Wyvonnia Lora, MD,as directed by  Wyvonnia Lora, MD while in the presence of Wyvonnia Lora, MD.  .I have reviewed the above documentation for accuracy and completeness, and I agree with the above. [name] MD

## 2023-06-17 NOTE — Telephone Encounter (Signed)
Copied from CRM [id number]. Topic: Clinical - Medication Question >> Jun 17, 2023 10:24 AM Tiffany H wrote: Reason for CRM: Baxter Hire, a pharmacist with [facility], called to advise that they've been made aware that patient isn't being prescribed a daily inhaler for COPD diagnosis. Baxter Hire would like to know if provider will be prescribing something and/or why/why not. Please assist.   Phone: [phone] Fax: [phone]

## 2023-06-18 ENCOUNTER — Other Ambulatory Visit: Payer: Self-pay | Admitting: *Deleted

## 2023-06-18 DIAGNOSIS — J441 Chronic obstructive pulmonary disease with (acute) exacerbation: Secondary | ICD-10-CM

## 2023-06-18 MED ORDER — ALBUTEROL SULFATE HFA 108 (90 BASE) MCG/ACT IN AERS: 2.0000 | INHALATION_SPRAY | Freq: Four times a day (QID) | RESPIRATORY_TRACT | 3 refills | Status: AC | PRN

## 2023-06-18 MED ORDER — SPIRIVA RESPIMAT 2.5 MCG/ACT IN AERS: 2.0000 | INHALATION_SPRAY | Freq: Every day | RESPIRATORY_TRACT | 3 refills | Status: DC

## 2023-06-18 NOTE — Telephone Encounter (Signed)
Patient not on any inhalers. Rx's sent per pcp. Patient stated she has not heard from pulmonary. Referral placed.

## 2023-06-21 ENCOUNTER — Encounter: Payer: Self-pay | Admitting: Hematology

## 2023-07-09 DIAGNOSIS — I4891 Unspecified atrial fibrillation: Secondary | ICD-10-CM | POA: Diagnosis not present

## 2023-07-11 ENCOUNTER — Other Ambulatory Visit: Payer: Self-pay | Admitting: Family Medicine

## 2023-07-12 DIAGNOSIS — I10 Essential (primary) hypertension: Secondary | ICD-10-CM | POA: Diagnosis not present

## 2023-07-12 DIAGNOSIS — F17200 Nicotine dependence, unspecified, uncomplicated: Secondary | ICD-10-CM | POA: Diagnosis not present

## 2023-07-12 DIAGNOSIS — F17210 Nicotine dependence, cigarettes, uncomplicated: Secondary | ICD-10-CM | POA: Diagnosis not present

## 2023-07-12 DIAGNOSIS — E6609 Other obesity due to excess calories: Secondary | ICD-10-CM | POA: Diagnosis not present

## 2023-07-12 DIAGNOSIS — Z79899 Other long term (current) drug therapy: Secondary | ICD-10-CM | POA: Diagnosis not present

## 2023-07-12 DIAGNOSIS — Z6832 Body mass index (BMI) 32.0-32.9, adult: Secondary | ICD-10-CM | POA: Diagnosis not present

## 2023-07-12 DIAGNOSIS — M5030 Other cervical disc degeneration, unspecified cervical region: Secondary | ICD-10-CM | POA: Diagnosis not present

## 2023-07-12 DIAGNOSIS — E119 Type 2 diabetes mellitus without complications: Secondary | ICD-10-CM | POA: Diagnosis not present

## 2023-07-12 DIAGNOSIS — M51369 Other intervertebral disc degeneration, lumbar region without mention of lumbar back pain or lower extremity pain: Secondary | ICD-10-CM | POA: Diagnosis not present

## 2023-07-12 DIAGNOSIS — F1120 Opioid dependence, uncomplicated: Secondary | ICD-10-CM | POA: Diagnosis not present

## 2023-07-15 DIAGNOSIS — Z79899 Other long term (current) drug therapy: Secondary | ICD-10-CM | POA: Diagnosis not present

## 2023-07-16 ENCOUNTER — Emergency Department (HOSPITAL_COMMUNITY): Payer: Medicare HMO

## 2023-07-16 ENCOUNTER — Encounter (HOSPITAL_COMMUNITY): Payer: Self-pay | Admitting: *Deleted

## 2023-07-16 ENCOUNTER — Ambulatory Visit (INDEPENDENT_AMBULATORY_CARE_PROVIDER_SITE_OTHER): Payer: Medicare HMO | Admitting: Physician Assistant

## 2023-07-16 ENCOUNTER — Emergency Department (HOSPITAL_COMMUNITY)
Admission: EM | Admit: 2023-07-16 | Discharge: 2023-07-16 | Disposition: A | Discharge status: Home / Self Care | Payer: Medicare HMO | Attending: Emergency Medicine | Admitting: Emergency Medicine

## 2023-07-16 ENCOUNTER — Encounter: Payer: Self-pay | Admitting: Physician Assistant

## 2023-07-16 ENCOUNTER — Other Ambulatory Visit: Payer: Self-pay

## 2023-07-16 VITALS — BP 130/70 | HR 78 | Temp 98.4°F | Ht 69.0 in | Wt 209.2 lb

## 2023-07-16 DIAGNOSIS — Z7982 Long term (current) use of aspirin: Secondary | ICD-10-CM | POA: Diagnosis not present

## 2023-07-16 DIAGNOSIS — Z7951 Long term (current) use of inhaled steroids: Secondary | ICD-10-CM | POA: Diagnosis not present

## 2023-07-16 DIAGNOSIS — R0609 Other forms of dyspnea: Secondary | ICD-10-CM | POA: Diagnosis not present

## 2023-07-16 DIAGNOSIS — I110 Hypertensive heart disease with heart failure: Secondary | ICD-10-CM | POA: Diagnosis not present

## 2023-07-16 DIAGNOSIS — J449 Chronic obstructive pulmonary disease, unspecified: Secondary | ICD-10-CM | POA: Diagnosis not present

## 2023-07-16 DIAGNOSIS — R600 Localized edema: Secondary | ICD-10-CM

## 2023-07-16 DIAGNOSIS — I509 Heart failure, unspecified: Secondary | ICD-10-CM | POA: Diagnosis not present

## 2023-07-16 DIAGNOSIS — R0602 Shortness of breath: Secondary | ICD-10-CM | POA: Diagnosis not present

## 2023-07-16 DIAGNOSIS — I5020 Unspecified systolic (congestive) heart failure: Secondary | ICD-10-CM | POA: Diagnosis not present

## 2023-07-16 DIAGNOSIS — Z7901 Long term (current) use of anticoagulants: Secondary | ICD-10-CM | POA: Insufficient documentation

## 2023-07-16 DIAGNOSIS — R0989 Other specified symptoms and signs involving the circulatory and respiratory systems: Secondary | ICD-10-CM | POA: Diagnosis not present

## 2023-07-16 DIAGNOSIS — J811 Chronic pulmonary edema: Secondary | ICD-10-CM | POA: Diagnosis not present

## 2023-07-16 DIAGNOSIS — R059 Cough, unspecified: Secondary | ICD-10-CM | POA: Diagnosis present

## 2023-07-16 LAB — CBC WITH DIFFERENTIAL/PLATELET
Abs Immature Granulocytes: 0.14 10*3/uL — ABNORMAL HIGH (ref 0.00–0.07)
Basophils Absolute: 0.1 10*3/uL (ref 0.0–0.1)
Basophils Relative: 1 %
Eosinophils Absolute: 0.4 10*3/uL (ref 0.0–0.5)
Eosinophils Relative: 2 %
HCT: 39.1 % (ref 36.0–46.0)
Hemoglobin: 10.9 g/dL — ABNORMAL LOW (ref 12.0–15.0)
Immature Granulocytes: 1 %
Lymphocytes Relative: 5 %
Lymphs Abs: 0.8 10*3/uL (ref 0.7–4.0)
MCH: 24.8 pg — ABNORMAL LOW (ref 26.0–34.0)
MCHC: 27.9 g/dL — ABNORMAL LOW (ref 30.0–36.0)
MCV: 88.9 fL (ref 80.0–100.0)
Monocytes Absolute: 0.3 10*3/uL (ref 0.1–1.0)
Monocytes Relative: 2 %
Neutro Abs: 14.1 10*3/uL — ABNORMAL HIGH (ref 1.7–7.7)
Neutrophils Relative %: 89 %
Platelets: 573 10*3/uL — ABNORMAL HIGH (ref 150–400)
RBC: 4.4 MIL/uL (ref 3.87–5.11)
RDW: 19.3 % — ABNORMAL HIGH (ref 11.5–15.5)
WBC: 15.9 10*3/uL — ABNORMAL HIGH (ref 4.0–10.5)
nRBC: 0.1 % (ref 0.0–0.2)

## 2023-07-16 LAB — COMPREHENSIVE METABOLIC PANEL
ALT: 11 U/L (ref 0–44)
AST: 13 U/L — ABNORMAL LOW (ref 15–41)
Albumin: 3.3 g/dL — ABNORMAL LOW (ref 3.5–5.0)
Alkaline Phosphatase: 84 U/L (ref 38–126)
Anion gap: 13 (ref 5–15)
BUN: 13 mg/dL (ref 8–23)
CO2: 25 mmol/L (ref 22–32)
Calcium: 8.9 mg/dL (ref 8.9–10.3)
Chloride: 100 mmol/L (ref 98–111)
Creatinine, Ser: 0.38 mg/dL — ABNORMAL LOW (ref 0.44–1.00)
GFR, Estimated: >60 mL/min (ref >60)
Glucose, Bld: 166 mg/dL — ABNORMAL HIGH (ref 70–99)
Potassium: 3.8 mmol/L (ref 3.5–5.1)
Sodium: 138 mmol/L (ref 135–145)
Total Bilirubin: 0.5 mg/dL (ref 0.0–1.2)
Total Protein: 6.5 g/dL (ref 6.5–8.1)

## 2023-07-16 LAB — RESP PANEL BY RT-PCR (RSV, FLU A&B, COVID)  RVPGX2
Influenza A by PCR: NEGATIVE
Influenza B by PCR: NEGATIVE
Resp Syncytial Virus by PCR: NEGATIVE
SARS Coronavirus 2 by RT PCR: NEGATIVE

## 2023-07-16 LAB — TROPONIN I (HIGH SENSITIVITY): Troponin I (High Sensitivity): 9 ng/L (ref <18)

## 2023-07-16 LAB — BRAIN NATRIURETIC PEPTIDE: B Natriuretic Peptide: 173.5 pg/mL — ABNORMAL HIGH (ref 0.0–100.0)

## 2023-07-16 MED ORDER — SULFAMETHOXAZOLE-TRIMETHOPRIM 800-160 MG PO TABS: 1.0000 | ORAL_TABLET | Freq: Two times a day (BID) | ORAL | 0 refills | Status: AC

## 2023-07-16 MED ORDER — SODIUM CHLORIDE 0.9% FLUSH
3.0000 mL | Freq: Once | INTRAVENOUS | Status: DC
Start: 2023-07-16 — End: 2023-07-16

## 2023-07-16 MED ORDER — FUROSEMIDE 40 MG PO TABS
20.0000 mg | ORAL_TABLET | Freq: Once | ORAL | Status: AC
  Administered 2023-07-16: 20 mg via ORAL
  Filled 2023-07-16: qty 1

## 2023-07-16 MED ORDER — SULFAMETHOXAZOLE-TRIMETHOPRIM 800-160 MG PO TABS: 1.0000 | ORAL_TABLET | Freq: Two times a day (BID) | ORAL | 0 refills | Status: DC

## 2023-07-16 NOTE — ED Provider Triage Note (Signed)
Emergency Medicine Provider Triage Evaluation Note  Katelyn Cline , a 63 y.o. female  was evaluated in triage.  Pt complains of productive cough and BLE swelling for past two weeks. Normally wears 3L  at home. Has not taken lasix for a couple of days d/t not wanting to use restroom as frequently  Made appointment with PCP who recommended pt to be evaluated in ED  Review of Systems  Positive: productive cough and BLE swelling for past two weeks Negative: CP, fever  Physical Exam  BP (!) 150/79 (BP Location: Left Arm)   Pulse 100   Temp 98 F (36.7 C) (Oral)   Resp 20   Wt 94.8 kg   SpO2 93%   BMI 30.86 kg/m  Gen:   Awake, no distress   Resp:  Normal effort  MSK:   Moves extremities without difficulty  Other:  2+ pitting edema to ankles bilaterally. No TTP  Medical Decision Making  Medically screening exam initiated at 3:04 PM.  Appropriate orders placed.  Katelyn Cline was informed that the remainder of the evaluation will be completed by another provider, this initial triage assessment does not replace that evaluation, and the importance of remaining in the ED until their evaluation is complete.  [place] started   Judithann Sheen, [place] 07/16/23 [phone]

## 2023-07-16 NOTE — ED Provider Notes (Signed)
[place] EMERGENCY DEPARTMENT AT [facility] Provider Note   CSN: [id number] Arrival date & time: 07/16/23  1254     History {Add pertinent medical, surgical, social history, OB history to HPI:1} Chief Complaint  Patient presents with   Cough    Katelyn Cline is a 63 y.o. female.  Patient has a history of COPD and heart failure.  Patient has had a cough for a number days without sputum production.   Cough      Home Medications Prior to Admission medications   Medication Sig Start Date End Date Taking? Authorizing Provider  sulfamethoxazole-trimethoprim (BACTRIM DS) 800-160 MG tablet Take 1 tablet by mouth 2 (two) times daily for 7 days. 07/16/23 07/23/23 Yes [name], MD  albuterol (VENTOLIN HFA) 108 (90 Base) MCG/ACT inhaler Inhale 2 puffs into the lungs every 6 (six) hours as needed for wheezing or shortness of breath (as needed). Patient not taking: Reported on 07/16/2023 06/18/23   [name], MD  aspirin EC 81 MG tablet Take 1 tablet (81 mg total) by mouth daily. Swallow whole. 11/17/22   [name], MD  atorvastatin (LIPITOR) 80 MG tablet TAKE ONE TABLET BY MOUTH EVERYDAY AT BEDTIME 03/04/23   [name], MD  Blood Glucose Monitoring Suppl (BLOOD GLUCOSE SYSTEM PAK) KIT Please dispense based on patient and insurance preference. Use as directed to monitor FSBS 2x daily. Dx: E11.9 10/18/19   [name], MD  buPROPion ER [name] SR) 100 MG 12 hr tablet TAKE ONE TABLET BY MOUTH EVERY MORNING and TAKE ONE TABLET BY MOUTH EVERYDAY AT BEDTIME 03/04/23   [name], MD  diltiazem (CARDIZEM CD) 120 MG 24 hr capsule Take 1 capsule (120 mg total) by mouth daily. 04/08/23   [name], MD  DULoxetine (CYMBALTA) 60 MG capsule 1 tab po bid 03/04/23   [name], MD  ELIQUIS 5 MG TABS tablet Take 1 tablet (5 mg total) by mouth 2 (two) times daily. 04/07/23   [name], MD  ezetimibe (ZETIA) 10 MG tablet Take 1 tablet (10 mg  total) by mouth every morning. 03/04/23   [name], MD  fenofibrate (TRICOR) 145 MG tablet Take 1 tablet (145 mg total) by mouth daily. 03/04/23   [name], MD  fish oil-omega-3 fatty acids 1000 MG capsule Take 2 capsules (2 g total) by mouth 2 (two) times daily. 11/17/22   [name], MD  furosemide (LASIX) 20 MG tablet Take 1 tablet (20 mg total) by mouth daily as needed. 04/07/23   [name], MD  gabapentin (NEURONTIN) 100 MG capsule Take 3 capsules by mouth three times daily as needed. Patient taking differently: as needed. 06/11/23   [name], MD  glucose blood (ACCU-CHEK GUIDE) test strip Use as instructed 04/27/22   [name], MD  Glucose Blood (BLOOD GLUCOSE TEST STRIPS) STRP Please dispense based on patient and insurance preference. Use as directed to monitor FSBS 2x daily. Dx: E11.9 10/18/19   [name], MD  hydrochlorothiazide (HYDRODIURIL) 25 MG tablet Take 1 tablet (25 mg total) by mouth every morning. 03/04/23   [name], MD  hydroxyurea (HYDREA) 500 MG capsule TAKE TWO CAPSULES BY MOUTH EVERYDAY AT BEDTIME. Take 1 additonal 500mg  of hydroxyurea on Monday and Thursday. 06/15/23   [name], MD  lisinopril (ZESTRIL) 5 MG tablet TAKE ONE TABLET BY MOUTH EVERYDAY AT BEDTIME 03/04/23   [name],  MD  metFORMIN (GLUCOPHAGE) 1000 MG tablet TAKE ONE TABLET BY MOUTH EVERY MORNING and TAKE ONE TABLET BY MOUTH EVERYDAY AT BEDTIME 03/04/23   [name], MD  Microlet Lancets MISC Please dispense based on patient and insurance preference. Use as directed to monitor FSBS 2x daily. Dx: E11.9 10/18/19   [name], MD  naloxone [facility]) nasal spray 4 mg/0.1 mL  07/21/22   [provider]  nystatin (MYCOSTATIN/NYSTOP) powder Apply 1 Application topically 3 (three) times daily. PA Case: [id number] Approved 06/30/2019- 06/28/2020 05/18/22   [name], MD  nystatin cream (MYCOSTATIN) Apply topically twice daily. 07/11/23    [name], MD  oxyCODONE-acetaminophen (PERCOCET) 10-325 MG tablet Take 1 tablet by mouth 4 (four) times daily as needed for severe pain. 07/18/18   [place], [name], MD  potassium chloride SA (KLOR-CON M) 20 MEQ tablet Take 1 tablet (20 mEq total) by mouth daily as needed. Take if take furosemide 04/07/23   [name], MD  senna (SENOKOT) 8.6 MG TABS tablet Take 2 tablets by mouth daily as needed for mild constipation.    [provider]  Tiotropium Bromide Monohydrate (SPIRIVA RESPIMAT) 2.5 MCG/ACT AERS Inhale 2 puffs into the lungs daily. Patient not taking: Reported on 07/16/2023 06/18/23   [name], MD  Vitamin D, Ergocalciferol, (DRISDOL) 1.25 MG (50000 UNIT) CAPS capsule TAKE ONE TABLET BY MOUTH on sundays at bedtime 03/04/23   [name], MD      Allergies    Contrast media [iodinated contrast media], Erythromycin, Flagyl [metronidazole], Latex, Other, Penicillins, and Tetracyclines & related    Review of Systems   Review of Systems  Respiratory:  Positive for cough.     Physical Exam Updated Vital Signs BP (!) 150/79 (BP Location: Left Arm)   Pulse 100   Temp 98 F (36.7 C) (Oral)   Resp 20   Wt 94.8 kg   SpO2 93%   BMI 30.86 kg/m  Physical Exam  ED Results / Procedures / Treatments   Labs (all labs ordered are listed, but only abnormal results are displayed) Labs Reviewed  COMPREHENSIVE METABOLIC PANEL - Abnormal; Notable for the following components:      Result Value   Glucose, Bld 166 (*)    Creatinine, Ser 0.38 (*)    Albumin 3.3 (*)    AST 13 (*)    All other components within normal limits  CBC WITH DIFFERENTIAL/PLATELET - Abnormal; Notable for the following components:   WBC 15.9 (*)    Hemoglobin 10.9 (*)    MCH 24.8 (*)    MCHC 27.9 (*)    RDW 19.3 (*)    Platelets 573 (*)    Neutro Abs 14.1 (*)    Abs Immature Granulocytes 0.14 (*)    All other components within normal limits  BRAIN NATRIURETIC PEPTIDE - Abnormal;  Notable for the following components:   B Natriuretic Peptide 173.5 (*)    All other components within normal limits  RESP PANEL BY RT-PCR (RSV, FLU A&B, COVID)  RVPGX2  TROPONIN I (HIGH SENSITIVITY)  TROPONIN I (HIGH SENSITIVITY)    EKG None  Radiology DG Chest 2 View Result Date: 07/16/2023 CLINICAL DATA:  Shortness of breath.  Edema. EXAM: CHEST - 2 VIEW COMPARISON:  03/11/2018. FINDINGS: Mild diffuse pulmonary vascular congestion. Redemonstration of focal scarring/postsurgical changes in the right mid lung zone. Bilateral lung fields are otherwise clear. Bilateral costophrenic angles are clear. Stable cardio-mediastinal silhouette. No acute  osseous abnormalities. The soft tissues are within normal limits. IMPRESSION: *Diffuse mild pulmonary vascular congestion. Electronically Signed   By: Jules Schick M.D.   On: 07/16/2023 16:18    Procedures Procedures  {Document cardiac monitor, telemetry assessment procedure when appropriate:1}  Medications Ordered in ED Medications  sodium chloride flush (NS) 0.9 % injection 3 mL (3 mLs Intravenous Not Given 07/16/23 1502)  furosemide (LASIX) tablet 20 mg (has no administration in time range)    ED Course/ Medical Decision Making/ A&P   {   Click here for ABCD2, HEART and other calculatorsREFRESH Note before signing :1}                              Medical Decision Making Amount and/or Complexity of Data Reviewed Labs: ordered. Radiology: ordered.  Risk Prescription drug management.   Patient with cough and mild CHF.  She will start back taking Lasix 20 a day and is placed empirically on Bactrim.  She will follow-up with her heart doctor next week  {Document critical care time when appropriate:1} {Document review of labs and clinical decision tools ie heart score, Chads2Vasc2 etc:1}  {Document your independent review of radiology images, and any outside records:1} {Document your discussion with family members, caretakers, and  with consultants:1} {Document social determinants of health affecting pt's care:1} {Document your decision making why or why not admission, treatments were needed:1} Final Clinical Impression(s) / ED Diagnoses Final diagnoses:  Systolic congestive heart failure, unspecified HF chronicity ([name])    Rx / DC Orders ED Discharge Orders          Ordered    sulfamethoxazole-trimethoprim (BACTRIM DS) 800-160 MG tablet  2 times daily        07/16/23 1655

## 2023-07-16 NOTE — Patient Instructions (Addendum)
Please

## 2023-07-16 NOTE — ED Triage Notes (Signed)
Smoker. Here by POV fropm home for sob, chills, non-productive cough, pedal edema, nasal drainage, chest congestion. H/o afib. Takes eliquis. H/o lung surgery r/t lung CA, COPD and polycythemia. On home O2 3L. Alert, NAD, calm, interactive, speaking in clear complete sentences. Denies CP.

## 2023-07-16 NOTE — Discharge Instructions (Signed)
Take your Lasix once a day.  Follow-up next week with your heart doctor or your family doctor

## 2023-07-16 NOTE — Progress Notes (Signed)
Katelyn Cline is a 63 y.o. female here for a follow up of a pre-existing problem.  History of Present Illness:   Chief Complaint  Patient presents with   Cough    Pt c/o cough x 2 weeks, coughing spells, worse at night sleeping in a recliner. Fatigue    HPI  Cough: Pt complains of cough starting 2 weeks ago.  Endorses fatigue and coughing spells that can last up to an hour.  Reports neck pain after her coughing spells. Symptoms worsen at nighttime and notes she is usually by herself at nighttime since her son works nights.  She planned to go to the ED last night, but decided not to go due to hesitancies.  Pt believed she may have pneumonia or bronchitis.  Has been eating/drinking well.  She is oxygen dependent -- 3L at baseline. She was told she had respiratory failure from lung surgery for melanoma however she has significant smoking history and was told recently that she has copd. She was started on Spiriva by her PCP in Dec but she reports that she never received it. She has not heard from pulmonary yet - referral placed.   Hx of COPD // Shortness of breath (being worked by cardiology) Endorses some ankle swelling.  Has not taken lasix for a couple of days due to recent urinary "trouble" and wanting to avoid using the bathroom frequently.  Has not been taking Spiriva, had not picked up her last refill.  Typically uses 3 liters of O2, but during this visit is on 2 liters in order to make the battery last longer.Is established with  Established with cardiology, next follow up is on 1/20 and also has an ECHO scheduled that day.  Was previously hospitalized in [facility] for COPD exacerbation.   Wt Readings from Last 3 Encounters:  07/16/23 209 lb 4 oz (94.9 kg)  06/15/23 201 lb 3.2 oz (91.3 kg)  06/10/23 210 lb 3.2 oz (95.3 kg)     Past Medical History:  Diagnosis Date   Agoraphobia    Anxiety    Arthritis    "all joints"   Chronic low back pain    Chronic respiratory failure  with hypoxia ([name])    4L  via [place],  followed by pcp,   (09-16-2018  per pt only uses while at home and at night ,  portable oxygen is not working)   Clotting disorder ([name]) 2013   Polycythemia vera   Colon polyps    COPD (chronic obstructive pulmonary disease) ([name])    DDD (degenerative disc disease), lumbosacral    Dental caries    DM type 2 (diabetes mellitus, type 2) ([name])    followed by pcp   Fibromyalgia    GERD (gastroesophageal reflux disease)    occasional , no meds   Heart murmur 2020   History of basal cell carcinoma (BCC) excision    s/p  Moh's of face/ nose 12/ 2013   History of palpitations    per pt found to have occaional PVCs   History of UTI    HTN (hypertension)    Hyperlipidemia    Loose, teeth    Major depression    Metastatic melanoma ([name]) 10/18/2012   12 mm posterior right upper lobe pulmonary nodule, max SUV 3.0  s/p  right wedge resection 10-21-2012,  followed by oncologist-- dr Candise Che,  no recurrence   Mixed stress and urge urinary incontinence    Myeloproliferative neoplasm ([name])    Narcolepsy  Obesity    OSA (obstructive sleep apnea)    Oxygen deficiency    Oxygen dependent    4 L via [place],  per pt portable oxygen not working but does use while at home and at night   Panic attacks    Periodontitis    Peripheral neuropathy    feet   Polycythemia vera(238.4) hematology/ oncology-- dr Candise Che ([facility])   first dx 02014 due to smoker/copd--  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)  hx phlebotomies   Pulmonary nodules    bilateral small stable per CT 03-11-2018   Scoliosis    Sleep apnea    Smokers' cough ([name])    per pt productive a little in the morning's   Symptoms of upper respiratory infection (URI) 03/12/2018   Urinary (tract) obstruction    Wears glasses      Social History   Tobacco Use   Smoking status: Every Day    Current packs/day: 0.50    Average packs/day: 0.5 packs/day for 50.0 years (25.0 ttl pk-yrs)     Types: Cigarettes   Smokeless tobacco: Never   Tobacco comments:    Current smoker 10 cigarettes daily 06/10/23  Vaping Use   Vaping status: Former   Quit date: 09/15/2016  Substance Use Topics   Alcohol use: Yes    Alcohol/week: 1.0 standard drink of alcohol    Types: 1 Standard drinks or equivalent per week    Comment: Not weekly   Drug use: Never    Past Surgical History:  Procedure Laterality Date   ABDOMINAL HYSTERECTOMY  1998   partial   BREAST LUMPECTOMY Bilateral 1995   benign per pt   COLONOSCOPY W/ BIOPSIES AND POLYPECTOMY     Hx: of   CYSTECTOMY  2000   abdominal wall    DILATION AND CURETTAGE OF UTERUS  yrs ago   MOHS SURGERY  2013   nose/face   MULTIPLE EXTRACTIONS WITH ALVEOLOPLASTY N/A 09/19/2018   Procedure: Extraction of tooth #'s 2, 3, 6-14, 18, and 20-30 with alveoloplasty;  Surgeon: Charlynne Pander, DDS;  Location: [facility] ORS;  Service: Oral Surgery;  Laterality: N/A;  GENERAL WITH NASAL TUBE   VIDEO ASSISTED THORACOSCOPY (VATS)/WEDGE RESECTION Right 10/21/2012   Procedure: VIDEO ASSISTED THORACOSCOPY (VATS)/WEDGE RESECTION;  Surgeon: Delight Ovens, MD;  Location: [facility] OR;  Service: Thoracic;  Laterality: Right;   VIDEO BRONCHOSCOPY N/A 10/21/2012   Procedure: VIDEO BRONCHOSCOPY;  Surgeon: Delight Ovens, MD;  Location: [facility] OR;  Service: Thoracic;  Laterality: N/A;    Family History  Problem Relation Age of Onset   Arthritis Mother    Hyperlipidemia Mother    Depression Mother    Anxiety disorder Mother    Dementia Mother    Hypertension Father    Hyperlipidemia Father    Heart disease Father    Stroke Father    Dementia Father    Heart disease Brother    ADD / ADHD Son    Alcohol abuse Maternal Grandfather    Bipolar disorder Neg Hx    Drug abuse Neg Hx    OCD Neg Hx    Paranoid behavior Neg Hx    Schizophrenia Neg Hx    Seizures Neg Hx    Sexual abuse Neg Hx    Physical abuse Neg Hx     Allergies  Allergen Reactions   Contrast  Media [Iodinated Contrast Media] Anaphylaxis   Erythromycin     Stomach pain   Flagyl [Metronidazole] Other (  See Comments)    Generalized pain.   Latex Other (See Comments)    Was told to be careful because of Dye allergy   Other     Iodine Dye   Penicillins Other (See Comments)    Patient was an infant, no idea of reaction. Tolerates Keflex. Did it involve swelling of the face/tongue/throat, SOB, or low BP? Unknown Did it involve sudden or severe rash/hives, skin peeling, or any reaction on the inside of your mouth or nose? Unknown Did you need to seek medical attention at a hospital or doctor's office? Unknown When did it last happen?      infant If all above answers are "NO", may proceed with cephalosporin use.    Tetracyclines & Related Other (See Comments)    GI side effects    Current Medications:   Current Outpatient Medications:    aspirin EC 81 MG tablet, Take 1 tablet (81 mg total) by mouth daily. Swallow whole., Disp: 100 tablet, Rfl: 4   atorvastatin (LIPITOR) 80 MG tablet, TAKE ONE TABLET BY MOUTH EVERYDAY AT BEDTIME, Disp: 90 tablet, Rfl: 1   Blood Glucose Monitoring Suppl (BLOOD GLUCOSE SYSTEM PAK) KIT, Please dispense based on patient and insurance preference. Use as directed to monitor FSBS 2x daily. Dx: E11.9, Disp: 1 kit, Rfl: 1   buPROPion ER (WELLBUTRIN SR) 100 MG 12 hr tablet, TAKE ONE TABLET BY MOUTH EVERY MORNING and TAKE ONE TABLET BY MOUTH EVERYDAY AT BEDTIME, Disp: 180 tablet, Rfl: 1   diltiazem (CARDIZEM CD) 120 MG 24 hr capsule, Take 1 capsule (120 mg total) by mouth daily., Disp: 90 capsule, Rfl: 1   DULoxetine (CYMBALTA) 60 MG capsule, 1 tab po bid, Disp: 180 capsule, Rfl: 1   ELIQUIS 5 MG TABS tablet, Take 1 tablet (5 mg total) by mouth 2 (two) times daily., Disp: 180 tablet, Rfl: 1   ezetimibe (ZETIA) 10 MG tablet, Take 1 tablet (10 mg total) by mouth every morning., Disp: 90 tablet, Rfl: 1   fenofibrate (TRICOR) 145 MG tablet, Take 1 tablet (145 mg  total) by mouth daily., Disp: 90 tablet, Rfl: 1   fish oil-omega-3 fatty acids 1000 MG capsule, Take 2 capsules (2 g total) by mouth 2 (two) times daily., Disp: 180 capsule, Rfl: 1   furosemide (LASIX) 20 MG tablet, Take 1 tablet (20 mg total) by mouth daily as needed., Disp: 30 tablet, Rfl: 3   gabapentin (NEURONTIN) 100 MG capsule, Take 3 capsules by mouth three times daily as needed. (Patient taking differently: as needed.), Disp: 270 capsule, Rfl: 2   glucose blood (ACCU-CHEK GUIDE) test strip, Use as instructed, Disp: 100 each, Rfl: 12   Glucose Blood (BLOOD GLUCOSE TEST STRIPS) STRP, Please dispense based on patient and insurance preference. Use as directed to monitor FSBS 2x daily. Dx: E11.9, Disp: 100 strip, Rfl: 1   hydrochlorothiazide (HYDRODIURIL) 25 MG tablet, Take 1 tablet (25 mg total) by mouth every morning., Disp: 90 tablet, Rfl: 1   hydroxyurea (HYDREA) 500 MG capsule, TAKE TWO CAPSULES BY MOUTH EVERYDAY AT BEDTIME. Take 1 additonal 500mg  of hydroxyurea on Monday and Thursday., Disp: 90 capsule, Rfl: 3   lisinopril (ZESTRIL) 5 MG tablet, TAKE ONE TABLET BY MOUTH EVERYDAY AT BEDTIME, Disp: 90 tablet, Rfl: 1   metFORMIN (GLUCOPHAGE) 1000 MG tablet, TAKE ONE TABLET BY MOUTH EVERY MORNING and TAKE ONE TABLET BY MOUTH EVERYDAY AT BEDTIME, Disp: 180 tablet, Rfl: 1   Microlet Lancets MISC, Please dispense based on patient and  insurance preference. Use as directed to monitor FSBS 2x daily. Dx: E11.9, Disp: 100 each, Rfl: 11   naloxone (NARCAN) nasal spray 4 mg/0.1 mL, , Disp: , Rfl:    nystatin (MYCOSTATIN/NYSTOP) powder, Apply 1 Application topically 3 (three) times daily. PA Case: [id number] Approved 06/30/2019- 06/28/2020, Disp: 60 g, Rfl: 2   nystatin cream (MYCOSTATIN), Apply topically twice daily., Disp: 60 g, Rfl: 1   oxyCODONE-acetaminophen (PERCOCET) 10-325 MG tablet, Take 1 tablet by mouth 4 (four) times daily as needed for severe pain., Disp: , Rfl:    potassium chloride SA (KLOR-CON  M) 20 MEQ tablet, Take 1 tablet (20 mEq total) by mouth daily as needed. Take if take furosemide, Disp: 30 tablet, Rfl: 3   senna (SENOKOT) 8.6 MG TABS tablet, Take 2 tablets by mouth daily as needed for mild constipation., Disp: , Rfl:    Vitamin D, Ergocalciferol, (DRISDOL) 1.25 MG (50000 UNIT) CAPS capsule, TAKE ONE TABLET BY MOUTH on sundays at bedtime, Disp: 12 capsule, Rfl: 1   albuterol (VENTOLIN HFA) 108 (90 Base) MCG/ACT inhaler, Inhale 2 puffs into the lungs every 6 (six) hours as needed for wheezing or shortness of breath (as needed). (Patient not taking: Reported on 07/16/2023), Disp: 17 each, Rfl: 3   Tiotropium Bromide Monohydrate (SPIRIVA RESPIMAT) 2.5 MCG/ACT AERS, Inhale 2 puffs into the lungs daily. (Patient not taking: Reported on 07/16/2023), Disp: 4 each, Rfl: 3   Review of Systems:   ROS - Negative unless otherwise specified per HPI.  Vitals:   Vitals:   07/16/23 1125  BP: 130/70  Pulse: 78  Temp: 98.4 F (36.9 C)  TempSrc: Temporal  SpO2: 98%  Weight: 209 lb 4 oz (94.9 kg)  Height: 5\' 9"  (1.753 m)     Body mass index is 30.9 kg/m.  Physical Exam:   Physical Exam Vitals and nursing note reviewed.  Constitutional:      General: She is not in acute distress.    Appearance: She is well-developed. She is not ill-appearing or toxic-appearing.  Cardiovascular:     Rate and Rhythm: Normal rate and regular rhythm.     Pulses: Normal pulses.     Heart sounds: Normal heart sounds, S1 normal and S2 normal.  Pulmonary:     Effort: Pulmonary effort is normal.     Breath sounds: Wheezing, rhonchi and rales present.  Musculoskeletal:     Right lower leg: 1+ Pitting Edema present.     Left lower leg: 1+ Pitting Edema present.  Skin:    General: Skin is warm and dry.  Neurological:     Mental Status: She is alert.     GCS: GCS eye subscore is 4. GCS verbal subscore is 5. GCS motor subscore is 6.  Psychiatric:        Speech: Speech normal.        Behavior: Behavior  normal. Behavior is cooperative.     Assessment and Plan:   DOE (dyspnea on exertion); Localized edema Due to worsening symptom(s), weight gain, lower extremity swelling and concerning lung exam, I recommended patient be evaluated in the ER.  She is currently undergoing outpatient work-up from cardiology but has not had ultrasound to evaluate for heart failure  I'm concerned that in addition to copd exacerbation that she could have some fluid overload contributing to her symptom(s) She was agreeable to go to the ER   I, Isabelle Course, acting as a scribe for Jarold Motto, [place]., have documented all relevant documentation on the  behalf of [place], [place], as directed by  Jarold Motto, PA while in the presence of Jarold Motto, [place].  I, Jarold Motto, [place], have reviewed all documentation for this visit. The documentation on 07/16/23 for the exam, diagnosis, procedures, and orders are all accurate and complete.  I spent a total of 28 minutes on this visit, today 07/16/23, which included reviewing previous notes from PCP and cardiology, discussing plan of care with patient and using shared-decision making on next steps, and documenting the findings in the note.   Jarold Motto, PA-C

## 2023-07-19 ENCOUNTER — Ambulatory Visit (HOSPITAL_BASED_OUTPATIENT_CLINIC_OR_DEPARTMENT_OTHER)
Admission: RE | Admit: 2023-07-19 | Discharge: 2023-07-19 | Disposition: A | Discharge status: Home / Self Care | Payer: Self-pay | Source: Ambulatory Visit | Attending: Cardiovascular Disease | Admitting: Cardiovascular Disease

## 2023-07-19 ENCOUNTER — Ambulatory Visit (HOSPITAL_BASED_OUTPATIENT_CLINIC_OR_DEPARTMENT_OTHER): Payer: Medicare HMO

## 2023-07-19 DIAGNOSIS — I4891 Unspecified atrial fibrillation: Secondary | ICD-10-CM | POA: Diagnosis not present

## 2023-07-19 DIAGNOSIS — E782 Mixed hyperlipidemia: Secondary | ICD-10-CM

## 2023-07-19 DIAGNOSIS — F17200 Nicotine dependence, unspecified, uncomplicated: Secondary | ICD-10-CM | POA: Diagnosis not present

## 2023-07-19 DIAGNOSIS — I10 Essential (primary) hypertension: Secondary | ICD-10-CM

## 2023-07-19 LAB — ECHOCARDIOGRAM COMPLETE
AR max vel: 0.99 cm2
AV Area VTI: 1.1 cm2
AV Area mean vel: 0.99 cm2
AV Mean grad: 13.8 mm[Hg]
AV Peak grad: 26.4 mm[Hg]
Ao pk vel: 2.57 m/s
Area-P 1/2: 4.21 cm2
MV VTI: 1.89 cm2
S' Lateral: 3.59 cm

## 2023-07-23 ENCOUNTER — Telehealth: Payer: Self-pay | Admitting: *Deleted

## 2023-07-23 ENCOUNTER — Encounter: Payer: Self-pay | Admitting: *Deleted

## 2023-07-23 DIAGNOSIS — R931 Abnormal findings on diagnostic imaging of heart and coronary circulation: Secondary | ICD-10-CM

## 2023-07-23 NOTE — Telephone Encounter (Signed)
-----   Message from Nanetta Batty sent at 07/19/2023  4:12 PM EST ----- CCS 1238. At goal for secondary prevention. Please get cardiac PET then ROV with me to discuss

## 2023-07-23 NOTE — Telephone Encounter (Signed)
Spoke with pt, aware of results and recommendations. Order placed for PET scan and directions sent to patient via my chart.

## 2023-07-29 ENCOUNTER — Encounter (HOSPITAL_COMMUNITY): Payer: Self-pay | Admitting: Physician Assistant

## 2023-07-29 ENCOUNTER — Ambulatory Visit (HOSPITAL_COMMUNITY)
Admission: RE | Admit: 2023-07-29 | Discharge: 2023-07-29 | Disposition: A | Discharge status: Home / Self Care | Payer: Medicare HMO | Source: Ambulatory Visit | Attending: Physician Assistant | Admitting: Physician Assistant

## 2023-07-29 VITALS — BP 142/82 | HR 95 | Ht 69.0 in | Wt 205.8 lb

## 2023-07-29 DIAGNOSIS — I4819 Other persistent atrial fibrillation: Secondary | ICD-10-CM | POA: Insufficient documentation

## 2023-07-29 DIAGNOSIS — I2510 Atherosclerotic heart disease of native coronary artery without angina pectoris: Secondary | ICD-10-CM | POA: Diagnosis not present

## 2023-07-29 DIAGNOSIS — G4733 Obstructive sleep apnea (adult) (pediatric): Secondary | ICD-10-CM | POA: Diagnosis not present

## 2023-07-29 DIAGNOSIS — Z7901 Long term (current) use of anticoagulants: Secondary | ICD-10-CM | POA: Diagnosis not present

## 2023-07-29 DIAGNOSIS — Z79899 Other long term (current) drug therapy: Secondary | ICD-10-CM | POA: Diagnosis not present

## 2023-07-29 DIAGNOSIS — D6869 Other thrombophilia: Secondary | ICD-10-CM | POA: Diagnosis not present

## 2023-07-29 DIAGNOSIS — I10 Essential (primary) hypertension: Secondary | ICD-10-CM | POA: Insufficient documentation

## 2023-07-29 NOTE — Progress Notes (Signed)
Primary Care Physician: [name], MD Primary Cardiologist: Nanetta Batty, MD Electrophysiologist: None  Referring Physician: Dr Loel Ro is a 63 y.o. female with a history of COPD, DM, CAD, HTN, tobacco abuse, HLD, OSA, atrial fibrillation who presents for follow up in the [facility].  The patient reports that she was initially diagnosed with atrial fibrillation in 2018, however she was not started on anticoagulation and no further workup was pursued. She was hospitalized for an unclear reason 02/2023 and was told she was in afib at that time and started on Eliquis.  She was still in afib at her visit with Dr Allyson Sabal 06/08/23. Patient is on Eliquis for stroke prevention.   Her cardiac monitor showed 100% afib burden. She also had a cardiac CT which showed a calcium score of 1238.   Patient returns for follow up for atrial fibrillation. She continues to feel fatigued. Her SOB is chronic and unchanged. No bleeding issues on anticoagulation.   Today, he denies symptoms of palpitations, chest pain, orthopnea, PND, lower extremity edema, dizziness, presyncope, syncope, snoring, daytime somnolence, bleeding, or neurologic sequela. The patient is tolerating medications without difficulties and is otherwise without complaint today.    Atrial Fibrillation Risk Factors:  she does have symptoms or diagnosis of sleep apnea. she does not have a history of rheumatic fever.   Atrial Fibrillation Management history:  Previous antiarrhythmic drugs: none Previous cardioversions: none Previous ablations: none Anticoagulation history: Eliquis  ROS- All systems are reviewed and negative except as per the HPI above.  Past Medical History:  Diagnosis Date   Agoraphobia    Anxiety    Arthritis    "all joints"   Chronic low back pain    Chronic respiratory failure with hypoxia ([name])    4L  via [place],  followed by pcp,   (09-16-2018  per pt only uses  while at home and at night ,  portable oxygen is not working)   Clotting disorder ([name]) 2013   Polycythemia vera   Colon polyps    COPD (chronic obstructive pulmonary disease) ([name])    DDD (degenerative disc disease), lumbosacral    Dental caries    DM type 2 (diabetes mellitus, type 2) ([name])    followed by pcp   Fibromyalgia    GERD (gastroesophageal reflux disease)    occasional , no meds   Heart murmur 2020   History of basal cell carcinoma (BCC) excision    s/p  Moh's of face/ nose 12/ 2013   History of palpitations    per pt found to have occaional PVCs   History of UTI    HTN (hypertension)    Hyperlipidemia    Loose, teeth    Major depression    Metastatic melanoma ([name]) 10/18/2012   12 mm posterior right upper lobe pulmonary nodule, max SUV 3.0  s/p  right wedge resection 10-21-2012,  followed by oncologist-- dr Candise Che,  no recurrence   Mixed stress and urge urinary incontinence    Myeloproliferative neoplasm ([name])    Narcolepsy    Obesity    OSA (obstructive sleep apnea)    Oxygen deficiency    Oxygen dependent    4 L via [place],  per pt portable oxygen not working but does use while at home and at night   Panic attacks    Periodontitis    Peripheral neuropathy    feet   Polycythemia vera(238.4) hematology/ oncology-- dr Candise Che (  [facility])   first dx 02014 due to smoker/copd--  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 17 each 3    aspirin EC 81 MG tablet Take 1 tablet (81 mg total) by mouth daily. Swallow whole. 100 tablet 4   atorvastatin (LIPITOR) 80 MG tablet TAKE ONE TABLET BY MOUTH EVERYDAY AT BEDTIME 90 tablet 1   Blood Glucose Monitoring Suppl (BLOOD GLUCOSE SYSTEM PAK) KIT Please dispense based on patient and insurance preference. Use as directed to monitor FSBS 2x daily. Dx: E11.9 1 kit 1   buPROPion ER (WELLBUTRIN SR) 100 MG 12 hr tablet TAKE ONE TABLET BY MOUTH EVERY MORNING and TAKE ONE TABLET BY MOUTH EVERYDAY AT BEDTIME 180 tablet 1   diltiazem (CARDIZEM CD) 120 MG 24 hr capsule Take 1 capsule (120 mg total) by mouth daily. 90 capsule 1   DULoxetine (CYMBALTA) 60 MG capsule 1 tab po bid 180 capsule 1   ELIQUIS 5 MG TABS tablet Take 1 tablet (5 mg total) by mouth 2 (two) times daily. 180 tablet 1   ezetimibe (ZETIA) 10 MG tablet Take 1 tablet (10 mg total) by mouth every morning. 90 tablet 1   fenofibrate (TRICOR) 145 MG tablet Take 1 tablet (145 mg total) by mouth daily. 90 tablet 1   fish oil-omega-3 fatty acids 1000 MG capsule Take 2 capsules (2 g total) by mouth 2 (two) times daily. 180 capsule 1   furosemide (LASIX) 20 MG tablet Take 1 tablet (20 mg total) by mouth daily as needed. 30 tablet 3   gabapentin (NEURONTIN) 100 MG capsule Take 3 capsules by mouth three times daily as needed. (Patient taking differently: as needed.) 270 capsule 2   glucose blood (ACCU-CHEK GUIDE) test strip Use as instructed 100 each 12   Glucose Blood (BLOOD GLUCOSE TEST STRIPS) STRP Please dispense based on patient and insurance preference. Use as directed to monitor FSBS 2x daily. Dx: E11.9 100 strip 1   hydrochlorothiazide (HYDRODIURIL) 25 MG tablet Take 1 tablet (25 mg total) by mouth every morning. 90 tablet 1   hydroxyurea (HYDREA) 500 MG capsule TAKE TWO CAPSULES BY MOUTH EVERYDAY AT BEDTIME. Take 1 additonal 500mg  of hydroxyurea on Monday and Thursday. 90 capsule 3   lisinopril (ZESTRIL) 5 MG tablet TAKE ONE TABLET BY MOUTH  EVERYDAY AT BEDTIME 90 tablet 1   metFORMIN (GLUCOPHAGE) 1000 MG tablet TAKE ONE TABLET BY MOUTH EVERY MORNING and TAKE ONE TABLET BY MOUTH EVERYDAY AT BEDTIME 180 tablet 1   Microlet Lancets MISC Please dispense based on patient and insurance preference. Use as directed to monitor FSBS 2x daily. Dx: E11.9 100 each 11   naloxone (NARCAN) nasal spray 4 mg/0.1 mL      nystatin (MYCOSTATIN/NYSTOP) powder Apply 1 Application topically 3 (three) times daily. PA Case: [id number] Approved 06/30/2019- 06/28/2020 60 g 2   nystatin cream (MYCOSTATIN) Apply topically twice daily. 60 g  1   oxyCODONE-acetaminophen (PERCOCET) 10-325 MG tablet Take 1 tablet by mouth 4 (four) times daily as needed for severe pain.     potassium chloride SA (KLOR-CON M) 20 MEQ tablet Take 1 tablet (20 mEq total) by mouth daily as needed. Take if take furosemide 30 tablet 3   senna (SENOKOT) 8.6 MG TABS tablet Take 2 tablets by mouth daily as needed for mild constipation.     Tiotropium Bromide Monohydrate (SPIRIVA RESPIMAT) 2.5 MCG/ACT AERS Inhale 2 puffs into the lungs daily. 4 each 3   Vitamin D, Ergocalciferol, (DRISDOL) 1.25 MG (50000 UNIT) CAPS capsule TAKE ONE TABLET BY MOUTH on sundays at bedtime 12 capsule 1   No current facility-administered medications for this encounter.    Physical Exam: BP (!) 142/82   Pulse 95   Ht 5\' 9"  (1.753 m)   Wt 93.4 kg   BMI 30.39 kg/m   GEN: Well nourished, well developed in no acute distress CARDIAC: Irregularly irregular rate and rhythm, no murmurs, rubs, gallops RESPIRATORY:  Clear to auscultation without rales, wheezing or rhonchi, on O2 nasal canula  ABDOMEN: Soft, non-tender, non-distended EXTREMITIES:  No edema; No deformity    Wt Readings from Last 3 Encounters:  07/29/23 93.4 kg  07/16/23 94.8 kg  07/16/23 94.9 kg     EKG today demonstrates  Afib, slow R wave prog Vent. rate 95 BPM PR interval * ms QRS duration 94 ms QT/QTcB 350/439 ms   Echo 07/19/23  1. Left  ventricular ejection fraction, by estimation, is 55 to 60%. Left  ventricular ejection fraction by 3D volume is 58 %. The left ventricle has  normal function. The left ventricle has no regional wall motion  abnormalities. There is mild left ventricular hypertrophy. Left ventricular diastolic parameters are indeterminate.   2. Right ventricular systolic function is normal. The right ventricular  size is mildly enlarged. Tricuspid regurgitation signal is inadequate for  assessing PA pressure.   3. Left atrial size was moderately dilated.   4. The mitral valve is degenerative. Trivial mitral valve regurgitation.  Severe mitral annular calcification.   5. The aortic valve is calcified. There is moderate calcification of the  aortic valve. Aortic valve regurgitation is not visualized. Moderate  aortic valve stenosis. Mild AS by gradients (Vmax 2.6 m/s, MG ),  moderate by AVA (1.0cm^2) and DI (0.33).   Low SV index (24 cc/m^2), suspect paradoxical low flow low gradient  moderate AS   6. Aortic dilatation noted. There is mild dilatation of the ascending  aorta, measuring 38 mm.   7. The inferior vena cava is normal in size with greater than 50%  respiratory variability, suggesting right atrial pressure of 3 mmHg.    CHA2DS2-VASc Score = 4  The patient's score is based upon: CHF History: 0 HTN History: 1 Diabetes History: 1 Stroke History: 0 Vascular Disease History: 1 Age Score: 0 Gender Score: 1       ASSESSMENT AND PLAN: Persistent Atrial Fibrillation (ICD10:  I48.19) The patient's CHA2DS2-VASc score is 4, indicating a 4.8% annual risk of stroke.   Cardiac monitor showed 100% afib burden. We discussed rhythm control options today.  After discussing the risks and benefits, will plan for DCCV. Patient needs to arrange a ride with her son, she will call clinic back to schedule. If she has quick return of afib and AAD is needed, would avoid class IC with h/o CAD. Continue  diltiazem 120 mg daily Continue Eliquis 5 mg BID  Secondary  Hypercoagulable State (ICD10:  D68.69) The patient is at significant risk for stroke/thromboembolism based upon her CHA2DS2-VASc Score of 4.  Continue Apixaban (Eliquis). No bleeding issues.  HTN Stable on current regimen  OSA  Patient has a remote diagnosis of OSA but has not been on CPAP for years. Referred for sleep evaluation at last visit.  CAD CAC score 1238 on CT PET CT scheduled for 3/18 Followed by Dr Allyson Sabal  VHD Moderate AS Followed by Dr Allyson Sabal    Follow up with Dr Allyson Sabal as scheduled post DCCV.      Informed Consent   Shared Decision Making/Informed Consent The risks (stroke, cardiac arrhythmias rarely resulting in the need for a temporary or permanent pacemaker, skin irritation or burns and complications associated with conscious sedation including aspiration, arrhythmia, respiratory failure and death), benefits (restoration of normal sinus rhythm) and alternatives of a direct current cardioversion were explained in detail to Ms. Khamis and she agrees to proceed.       Jorja Loa PA-C Afib Clinic [facility] [address] [place], [place] [address] [phone]

## 2023-07-29 NOTE — Addendum Note (Signed)
Addended by: Bernita Buffy on: 07/29/2023 07:57 AM   Modules accepted: Orders

## 2023-08-07 ENCOUNTER — Other Ambulatory Visit: Payer: Self-pay | Admitting: Family Medicine

## 2023-08-07 DIAGNOSIS — F4310 Post-traumatic stress disorder, unspecified: Secondary | ICD-10-CM

## 2023-08-07 NOTE — Telephone Encounter (Signed)
 Needs appt

## 2023-08-09 ENCOUNTER — Telehealth: Payer: Self-pay | Admitting: Family Medicine

## 2023-08-09 NOTE — Telephone Encounter (Signed)
 LVM to schedule past due 6 mo fu visit per provider

## 2023-08-10 DIAGNOSIS — F17200 Nicotine dependence, unspecified, uncomplicated: Secondary | ICD-10-CM | POA: Diagnosis not present

## 2023-08-10 DIAGNOSIS — Z79899 Other long term (current) drug therapy: Secondary | ICD-10-CM | POA: Diagnosis not present

## 2023-08-10 DIAGNOSIS — I10 Essential (primary) hypertension: Secondary | ICD-10-CM | POA: Diagnosis not present

## 2023-08-10 DIAGNOSIS — F1120 Opioid dependence, uncomplicated: Secondary | ICD-10-CM | POA: Diagnosis not present

## 2023-08-10 DIAGNOSIS — F17210 Nicotine dependence, cigarettes, uncomplicated: Secondary | ICD-10-CM | POA: Diagnosis not present

## 2023-08-10 DIAGNOSIS — E7800 Pure hypercholesterolemia, unspecified: Secondary | ICD-10-CM | POA: Diagnosis not present

## 2023-08-10 DIAGNOSIS — E6609 Other obesity due to excess calories: Secondary | ICD-10-CM | POA: Diagnosis not present

## 2023-08-10 DIAGNOSIS — E119 Type 2 diabetes mellitus without complications: Secondary | ICD-10-CM | POA: Diagnosis not present

## 2023-08-10 DIAGNOSIS — M51369 Other intervertebral disc degeneration, lumbar region without mention of lumbar back pain or lower extremity pain: Secondary | ICD-10-CM | POA: Diagnosis not present

## 2023-08-10 DIAGNOSIS — Z6830 Body mass index (BMI) 30.0-30.9, adult: Secondary | ICD-10-CM | POA: Diagnosis not present

## 2023-08-10 DIAGNOSIS — M5030 Other cervical disc degeneration, unspecified cervical region: Secondary | ICD-10-CM | POA: Diagnosis not present

## 2023-08-12 ENCOUNTER — Telehealth: Payer: Self-pay

## 2023-08-12 NOTE — Telephone Encounter (Signed)
Left message for pt to call back regarding changing her office visit with Dr. Allyson Sabal to after her Cardiac PET scan on 3/18. Office visit currently scheduled for 3/12, would like to move this to after PET scan.

## 2023-08-17 DIAGNOSIS — Z79899 Other long term (current) drug therapy: Secondary | ICD-10-CM | POA: Diagnosis not present

## 2023-08-23 ENCOUNTER — Other Ambulatory Visit (HOSPITAL_COMMUNITY): Payer: Self-pay | Admitting: *Deleted

## 2023-08-23 DIAGNOSIS — I4819 Other persistent atrial fibrillation: Secondary | ICD-10-CM

## 2023-08-23 NOTE — Telephone Encounter (Signed)
 Left message for pt to call back to schedule office visit after Cardiac PET scan.

## 2023-08-31 ENCOUNTER — Ambulatory Visit (HOSPITAL_COMMUNITY)
Admission: RE | Admit: 2023-08-31 | Discharge: 2023-08-31 | Disposition: A | Discharge status: Home / Self Care | Payer: Medicare HMO | Source: Ambulatory Visit | Attending: Physician Assistant | Admitting: Physician Assistant

## 2023-08-31 ENCOUNTER — Encounter (HOSPITAL_COMMUNITY): Payer: Self-pay | Admitting: Physician Assistant

## 2023-08-31 VITALS — BP 148/80 | HR 100 | Ht 69.0 in | Wt 203.6 lb

## 2023-08-31 DIAGNOSIS — D6869 Other thrombophilia: Secondary | ICD-10-CM | POA: Diagnosis not present

## 2023-08-31 DIAGNOSIS — G4733 Obstructive sleep apnea (adult) (pediatric): Secondary | ICD-10-CM | POA: Insufficient documentation

## 2023-08-31 DIAGNOSIS — I10 Essential (primary) hypertension: Secondary | ICD-10-CM | POA: Insufficient documentation

## 2023-08-31 DIAGNOSIS — E119 Type 2 diabetes mellitus without complications: Secondary | ICD-10-CM | POA: Insufficient documentation

## 2023-08-31 DIAGNOSIS — Z7901 Long term (current) use of anticoagulants: Secondary | ICD-10-CM | POA: Diagnosis not present

## 2023-08-31 DIAGNOSIS — Z79899 Other long term (current) drug therapy: Secondary | ICD-10-CM | POA: Insufficient documentation

## 2023-08-31 DIAGNOSIS — I2510 Atherosclerotic heart disease of native coronary artery without angina pectoris: Secondary | ICD-10-CM | POA: Diagnosis not present

## 2023-08-31 DIAGNOSIS — J449 Chronic obstructive pulmonary disease, unspecified: Secondary | ICD-10-CM | POA: Insufficient documentation

## 2023-08-31 DIAGNOSIS — Z7984 Long term (current) use of oral hypoglycemic drugs: Secondary | ICD-10-CM | POA: Diagnosis not present

## 2023-08-31 DIAGNOSIS — E785 Hyperlipidemia, unspecified: Secondary | ICD-10-CM | POA: Diagnosis not present

## 2023-08-31 DIAGNOSIS — I4819 Other persistent atrial fibrillation: Secondary | ICD-10-CM | POA: Insufficient documentation

## 2023-08-31 LAB — CBC
HCT: 42.8 % (ref 36.0–46.0)
Hemoglobin: 12.2 g/dL (ref 12.0–15.0)
MCH: 25.2 pg — ABNORMAL LOW (ref 26.0–34.0)
MCHC: 28.5 g/dL — ABNORMAL LOW (ref 30.0–36.0)
MCV: 88.2 fL (ref 80.0–100.0)
Platelets: 692 10*3/uL — ABNORMAL HIGH (ref 150–400)
RBC: 4.85 MIL/uL (ref 3.87–5.11)
RDW: 19.5 % — ABNORMAL HIGH (ref 11.5–15.5)
WBC: 18.3 10*3/uL — ABNORMAL HIGH (ref 4.0–10.5)
nRBC: 0.1 % (ref 0.0–0.2)

## 2023-08-31 LAB — BASIC METABOLIC PANEL
Anion gap: 13 (ref 5–15)
BUN: 26 mg/dL — ABNORMAL HIGH (ref 8–23)
CO2: 23 mmol/L (ref 22–32)
Calcium: 9.6 mg/dL (ref 8.9–10.3)
Chloride: 102 mmol/L (ref 98–111)
Creatinine, Ser: 0.68 mg/dL (ref 0.44–1.00)
GFR, Estimated: >60 mL/min (ref >60)
Glucose, Bld: 182 mg/dL — ABNORMAL HIGH (ref 70–99)
Potassium: 4 mmol/L (ref 3.5–5.1)
Sodium: 138 mmol/L (ref 135–145)

## 2023-08-31 NOTE — Patient Instructions (Signed)
 Cardioversion scheduled for: Monday, March 10th   - Arrive at the [facility] and go to admitting at 9:30am   - Do not eat or drink anything after midnight the night prior to your procedure.   - Take all your morning medication (except diabetic medications) with a sip of water prior to arrival.  - You will not be able to drive home after your procedure.    - Do NOT miss any doses of your blood thinner - if you should miss a dose please notify our office immediately.   - If you feel as if you go back into normal rhythm prior to scheduled cardioversion, please notify our office immediately.   If your procedure is canceled in the cardioversion suite you will be charged a cancellation fee.   Follow up afib clinic 6 months.

## 2023-08-31 NOTE — Progress Notes (Signed)
 Primary Care Physician: [name], MD Primary Cardiologist: Nanetta Batty, MD Electrophysiologist: None  Referring Physician: Dr Loel Ro is a 63 y.o. female with a history of COPD, DM, CAD, HTN, tobacco abuse, HLD, OSA, atrial fibrillation who presents for follow up in the [facility].  The patient reports that she was initially diagnosed with atrial fibrillation in 2018, however she was not started on anticoagulation and no further workup was pursued. She was hospitalized for an unclear reason 02/2023 and was told she was in afib at that time and started on Eliquis.  She was still in afib at her visit with Dr Allyson Sabal 06/08/23. Patient is on Eliquis for stroke prevention.   Her cardiac monitor showed 100% afib burden. She also had a cardiac CT which showed a calcium score of 1238.   Patient returns for follow up for atrial fibrillation. She remains in afib with symptoms of  fatigue. She is scheduled for DCCV on 09/06/23. She denies any bleeding issues on anticoagulation.   Today, he denies symptoms of palpitations, chest pain, shortness of breath, orthopnea, PND, lower extremity edema, dizziness, presyncope, syncope,  bleeding, or neurologic sequela. The patient is tolerating medications without difficulties and is otherwise without complaint today.    Atrial Fibrillation Risk Factors:  she does have symptoms or diagnosis of sleep apnea. she does not have a history of rheumatic fever.   Atrial Fibrillation Management history:  Previous antiarrhythmic drugs: none Previous cardioversions: none Previous ablations: none Anticoagulation history: Eliquis  ROS- All systems are reviewed and negative except as per the HPI above.  Past Medical History:  Diagnosis Date   Agoraphobia    Anxiety    Arthritis    "all joints"   Chronic low back pain    Chronic respiratory failure with hypoxia ([name])    4L  via [place],  followed by pcp,    (09-16-2018  per pt only uses while at home and at night ,  portable oxygen is not working)   Clotting disorder ([name]) 2013   Polycythemia vera   Colon polyps    COPD (chronic obstructive pulmonary disease) ([name])    DDD (degenerative disc disease), lumbosacral    Dental caries    DM type 2 (diabetes mellitus, type 2) ([name])    followed by pcp   Fibromyalgia    GERD (gastroesophageal reflux disease)    occasional , no meds   Heart murmur 2020   History of basal cell carcinoma (BCC) excision    s/p  Moh's of face/ nose 12/ 2013   History of palpitations    per pt found to have occaional PVCs   History of UTI    HTN (hypertension)    Hyperlipidemia    Loose, teeth    Major depression    Metastatic melanoma ([name]) 10/18/2012   12 mm posterior right upper lobe pulmonary nodule, max SUV 3.0  s/p  right wedge resection 10-21-2012,  followed by oncologist-- dr Candise Che,  no recurrence   Mixed stress and urge urinary incontinence    Myeloproliferative neoplasm ([name])    Narcolepsy    Obesity    OSA (obstructive sleep apnea)    Oxygen deficiency    Oxygen dependent    4 L via [place],  per pt portable oxygen not working but does use while at home and at night   Panic attacks    Periodontitis    Peripheral neuropathy    feet  Polycythemia vera(238.4) hematology/ oncology-- dr Candise Che ([facility])   first dx 02014 due to smoker/copd--  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 17 each 3   aspirin EC 81 MG tablet Take 1 tablet (81 mg total) by mouth daily. Swallow whole. 100 tablet 4   atorvastatin (LIPITOR) 80 MG tablet TAKE ONE TABLET BY MOUTH EVERYDAY AT BEDTIME 90 tablet 1   Blood Glucose Monitoring Suppl (BLOOD GLUCOSE SYSTEM PAK) KIT Please dispense based on patient and insurance preference. Use as directed to monitor FSBS 2x daily. Dx: E11.9 1 kit 1   buPROPion ER (WELLBUTRIN SR) 100 MG 12 hr tablet TAKE ONE TABLET BY MOUTH EVERY MORNING and TAKE ONE TABLET BY MOUTH EVERYDAY AT BEDTIME 180 tablet 1   diltiazem (CARDIZEM CD) 120 MG 24 hr capsule Take 1 capsule (120 mg total) by mouth daily. 90 capsule 1   DULoxetine (CYMBALTA) 60 MG capsule Take 1 capsule by mouth twice daily. 180 capsule 0   ELIQUIS 5 MG TABS tablet Take 1 tablet (5 mg total) by mouth 2 (two) times daily. 180 tablet 1   ezetimibe (ZETIA) 10 MG tablet Take 1 tablet (10 mg total) by mouth every morning. 90 tablet 1   fenofibrate (TRICOR) 145 MG tablet Take 1 tablet (145 mg total) by mouth daily. 90 tablet 1   fish oil-omega-3 fatty acids 1000 MG capsule Take 2 capsules (2 g total) by mouth 2 (two) times daily. 180 capsule 1   furosemide (LASIX) 20 MG tablet Take 1 tablet (20 mg total) by mouth daily as needed. 30 tablet 3   gabapentin (NEURONTIN) 100 MG capsule Take 3 capsules by mouth three times daily as needed. (Patient taking differently: as needed.) 270 capsule 2   glucose blood (ACCU-CHEK GUIDE) test strip Use as instructed 100 each 12   Glucose Blood (BLOOD GLUCOSE TEST STRIPS) STRP Please dispense based on patient and insurance preference. Use as directed to monitor FSBS 2x daily. Dx: E11.9 100 strip 1   hydrochlorothiazide (HYDRODIURIL) 25 MG tablet Take 1 tablet (25 mg total) by mouth every morning. 90 tablet 1   hydroxyurea (HYDREA) 500 MG capsule TAKE TWO CAPSULES BY MOUTH EVERYDAY AT BEDTIME. Take 1 additonal 500mg  of hydroxyurea on Monday and Thursday. 90 capsule 3    lisinopril (ZESTRIL) 5 MG tablet TAKE ONE TABLET BY MOUTH EVERYDAY AT BEDTIME 90 tablet 1   metFORMIN (GLUCOPHAGE) 1000 MG tablet TAKE ONE TABLET BY MOUTH EVERY MORNING and TAKE ONE TABLET BY MOUTH EVERYDAY AT BEDTIME 180 tablet 1   Microlet Lancets MISC Please dispense based on patient and insurance preference. Use as directed to monitor FSBS 2x daily. Dx: E11.9 100 each 11   naloxone (NARCAN) nasal spray 4 mg/0.1 mL      nystatin (MYCOSTATIN/NYSTOP) powder Apply 1 Application topically 3 (three) times daily. PA Case: [id number] Approved 06/30/2019- 06/28/2020 60 g 2  nystatin cream (MYCOSTATIN) Apply topically twice daily. 60 g 1   oxyCODONE-acetaminophen (PERCOCET) 10-325 MG tablet Take 1 tablet by mouth 4 (four) times daily as needed for severe pain.     potassium chloride SA (KLOR-CON M) 20 MEQ tablet Take 1 tablet (20 mEq total) by mouth daily as needed. Take if take furosemide 30 tablet 3   senna (SENOKOT) 8.6 MG TABS tablet Take 2 tablets by mouth daily as needed for mild constipation.     Tiotropium Bromide Monohydrate (SPIRIVA RESPIMAT) 2.5 MCG/ACT AERS Inhale 2 puffs into the lungs daily. 4 each 3   Vitamin D, Ergocalciferol, (DRISDOL) 1.25 MG (50000 UNIT) CAPS capsule Take 1 capsule by mouth on Sundays at bedtime. 12 capsule 0   No current facility-administered medications for this encounter.    Physical Exam: BP (!) 148/80   Pulse 100   Ht 5\' 9"  (1.753 m)   Wt 92.4 kg   BMI 30.07 kg/m   GEN: Well nourished, well developed in no acute distress NECK: No JVD; No carotid bruits CARDIAC: Irregularly irregular rate and rhythm, no rubs, gallops, 1-2/6 systolic murmur  RESPIRATORY:  Clear to auscultation without rales, wheezing or rhonchi  ABDOMEN: Soft, non-tender, non-distended EXTREMITIES:  No edema; No deformity    Wt Readings from Last 3 Encounters:  08/31/23 92.4 kg  07/29/23 93.4 kg  07/16/23 94.8 kg     EKG today demonstrates  Afib, PVCs Vent. rate 100 BPM PR  interval * ms QRS duration 100 ms QT/QTcB 350/451 ms   Echo 07/19/23  1. Left ventricular ejection fraction, by estimation, is 55 to 60%. Left  ventricular ejection fraction by 3D volume is 58 %. The left ventricle has  normal function. The left ventricle has no regional wall motion  abnormalities. There is mild left ventricular hypertrophy. Left ventricular diastolic parameters are indeterminate.   2. Right ventricular systolic function is normal. The right ventricular  size is mildly enlarged. Tricuspid regurgitation signal is inadequate for  assessing PA pressure.   3. Left atrial size was moderately dilated.   4. The mitral valve is degenerative. Trivial mitral valve regurgitation.  Severe mitral annular calcification.   5. The aortic valve is calcified. There is moderate calcification of the  aortic valve. Aortic valve regurgitation is not visualized. Moderate  aortic valve stenosis. Mild AS by gradients (Vmax 2.6 m/s, MG ),  moderate by AVA (1.0cm^2) and DI (0.33).   Low SV index (24 cc/m^2), suspect paradoxical low flow low gradient  moderate AS   6. Aortic dilatation noted. There is mild dilatation of the ascending  aorta, measuring 38 mm.   7. The inferior vena cava is normal in size with greater than 50%  respiratory variability, suggesting right atrial pressure of 3 mmHg.    CHA2DS2-VASc Score = 4  The patient's score is based upon: CHF History: 0 HTN History: 1 Diabetes History: 1 Stroke History: 0 Vascular Disease History: 1 Age Score: 0 Gender Score: 1       ASSESSMENT AND PLAN: Persistent Atrial Fibrillation (ICD10:  I48.19) The patient's CHA2DS2-VASc score is 4, indicating a 4.8% annual risk of stroke.   Cardiac monitor showed 100% afib burden. Patient scheduled for DCCV on 09/06/23 Check bmet/cbc today Continue diltiazem 120 mg daily Continue Eliquis 5 mg BID. Patient denies any missed doses in the past 3 weeks.   Secondary Hypercoagulable State  (ICD10:  D68.69) The patient is at significant risk for stroke/thromboembolism based upon her CHA2DS2-VASc Score of 4.  Continue Apixaban (Eliquis).   HTN Stable on current regimen  OSA  Patient diagnosed remotely but has not been on CPAP for years. Referred to sleep medicine at a previous visit.   CAD CAC score 1238 on CT No anginal symptoms Followed by Dr Allyson Sabal  VHD Moderate AS Followed by Dr Allyson Sabal    Follow up with Dr Allyson Sabal as scheduled. AF clinic in 6 months.      Informed Consent   Shared Decision Making/Informed Consent The risks (stroke, cardiac arrhythmias rarely resulting in the need for a temporary or permanent pacemaker, skin irritation or burns and complications associated with conscious sedation including aspiration, arrhythmia, respiratory failure and death), benefits (restoration of normal sinus rhythm) and alternatives of a direct current cardioversion were explained in detail to Ms. Glynn and she agrees to proceed.       Jorja Loa PA-C Afib Clinic [facility] [address] [place], [place] [address] [phone]

## 2023-08-31 NOTE — H&P (View-Only) (Signed)
 Primary Care Physician: [name], MD Primary Cardiologist: Nanetta Batty, MD Electrophysiologist: None  Referring Physician: Dr Loel Ro is a 63 y.o. female with a history of COPD, DM, CAD, HTN, tobacco abuse, HLD, OSA, atrial fibrillation who presents for follow up in the [facility].  The patient reports that she was initially diagnosed with atrial fibrillation in 2018, however she was not started on anticoagulation and no further workup was pursued. She was hospitalized for an unclear reason 02/2023 and was told she was in afib at that time and started on Eliquis.  She was still in afib at her visit with Dr Allyson Sabal 06/08/23. Patient is on Eliquis for stroke prevention.   Her cardiac monitor showed 100% afib burden. She also had a cardiac CT which showed a calcium score of 1238.   Patient returns for follow up for atrial fibrillation. She remains in afib with symptoms of  fatigue. She is scheduled for DCCV on 09/06/23. She denies any bleeding issues on anticoagulation.   Today, he denies symptoms of palpitations, chest pain, shortness of breath, orthopnea, PND, lower extremity edema, dizziness, presyncope, syncope,  bleeding, or neurologic sequela. The patient is tolerating medications without difficulties and is otherwise without complaint today.    Atrial Fibrillation Risk Factors:  she does have symptoms or diagnosis of sleep apnea. she does not have a history of rheumatic fever.   Atrial Fibrillation Management history:  Previous antiarrhythmic drugs: none Previous cardioversions: none Previous ablations: none Anticoagulation history: Eliquis  ROS- All systems are reviewed and negative except as per the HPI above.  Past Medical History:  Diagnosis Date   Agoraphobia    Anxiety    Arthritis    "all joints"   Chronic low back pain    Chronic respiratory failure with hypoxia ([name])    4L  via [place],  followed by pcp,    (09-16-2018  per pt only uses while at home and at night ,  portable oxygen is not working)   Clotting disorder ([name]) 2013   Polycythemia vera   Colon polyps    COPD (chronic obstructive pulmonary disease) ([name])    DDD (degenerative disc disease), lumbosacral    Dental caries    DM type 2 (diabetes mellitus, type 2) ([name])    followed by pcp   Fibromyalgia    GERD (gastroesophageal reflux disease)    occasional , no meds   Heart murmur 2020   History of basal cell carcinoma (BCC) excision    s/p  Moh's of face/ nose 12/ 2013   History of palpitations    per pt found to have occaional PVCs   History of UTI    HTN (hypertension)    Hyperlipidemia    Loose, teeth    Major depression    Metastatic melanoma ([name]) 10/18/2012   12 mm posterior right upper lobe pulmonary nodule, max SUV 3.0  s/p  right wedge resection 10-21-2012,  followed by oncologist-- dr Candise Che,  no recurrence   Mixed stress and urge urinary incontinence    Myeloproliferative neoplasm ([name])    Narcolepsy    Obesity    OSA (obstructive sleep apnea)    Oxygen deficiency    Oxygen dependent    4 L via [place],  per pt portable oxygen not working but does use while at home and at night   Panic attacks    Periodontitis    Peripheral neuropathy    feet  Polycythemia vera(238.4) hematology/ oncology-- dr Candise Che ([facility])   first dx 02014 due to smoker/copd--  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 17 each 3   aspirin EC 81 MG tablet Take 1 tablet (81 mg total) by mouth daily. Swallow whole. 100 tablet 4   atorvastatin (LIPITOR) 80 MG tablet TAKE ONE TABLET BY MOUTH EVERYDAY AT BEDTIME 90 tablet 1   Blood Glucose Monitoring Suppl (BLOOD GLUCOSE SYSTEM PAK) KIT Please dispense based on patient and insurance preference. Use as directed to monitor FSBS 2x daily. Dx: E11.9 1 kit 1   buPROPion ER (WELLBUTRIN SR) 100 MG 12 hr tablet TAKE ONE TABLET BY MOUTH EVERY MORNING and TAKE ONE TABLET BY MOUTH EVERYDAY AT BEDTIME 180 tablet 1   diltiazem (CARDIZEM CD) 120 MG 24 hr capsule Take 1 capsule (120 mg total) by mouth daily. 90 capsule 1   DULoxetine (CYMBALTA) 60 MG capsule Take 1 capsule by mouth twice daily. 180 capsule 0   ELIQUIS 5 MG TABS tablet Take 1 tablet (5 mg total) by mouth 2 (two) times daily. 180 tablet 1   ezetimibe (ZETIA) 10 MG tablet Take 1 tablet (10 mg total) by mouth every morning. 90 tablet 1   fenofibrate (TRICOR) 145 MG tablet Take 1 tablet (145 mg total) by mouth daily. 90 tablet 1   fish oil-omega-3 fatty acids 1000 MG capsule Take 2 capsules (2 g total) by mouth 2 (two) times daily. 180 capsule 1   furosemide (LASIX) 20 MG tablet Take 1 tablet (20 mg total) by mouth daily as needed. 30 tablet 3   gabapentin (NEURONTIN) 100 MG capsule Take 3 capsules by mouth three times daily as needed. (Patient taking differently: as needed.) 270 capsule 2   glucose blood (ACCU-CHEK GUIDE) test strip Use as instructed 100 each 12   Glucose Blood (BLOOD GLUCOSE TEST STRIPS) STRP Please dispense based on patient and insurance preference. Use as directed to monitor FSBS 2x daily. Dx: E11.9 100 strip 1   hydrochlorothiazide (HYDRODIURIL) 25 MG tablet Take 1 tablet (25 mg total) by mouth every morning. 90 tablet 1   hydroxyurea (HYDREA) 500 MG capsule TAKE TWO CAPSULES BY MOUTH EVERYDAY AT BEDTIME. Take 1 additonal 500mg  of hydroxyurea on Monday and Thursday. 90 capsule 3    lisinopril (ZESTRIL) 5 MG tablet TAKE ONE TABLET BY MOUTH EVERYDAY AT BEDTIME 90 tablet 1   metFORMIN (GLUCOPHAGE) 1000 MG tablet TAKE ONE TABLET BY MOUTH EVERY MORNING and TAKE ONE TABLET BY MOUTH EVERYDAY AT BEDTIME 180 tablet 1   Microlet Lancets MISC Please dispense based on patient and insurance preference. Use as directed to monitor FSBS 2x daily. Dx: E11.9 100 each 11   naloxone (NARCAN) nasal spray 4 mg/0.1 mL      nystatin (MYCOSTATIN/NYSTOP) powder Apply 1 Application topically 3 (three) times daily. PA Case: [id number] Approved 06/30/2019- 06/28/2020 60 g 2  nystatin cream (MYCOSTATIN) Apply topically twice daily. 60 g 1   oxyCODONE-acetaminophen (PERCOCET) 10-325 MG tablet Take 1 tablet by mouth 4 (four) times daily as needed for severe pain.     potassium chloride SA (KLOR-CON M) 20 MEQ tablet Take 1 tablet (20 mEq total) by mouth daily as needed. Take if take furosemide 30 tablet 3   senna (SENOKOT) 8.6 MG TABS tablet Take 2 tablets by mouth daily as needed for mild constipation.     Tiotropium Bromide Monohydrate (SPIRIVA RESPIMAT) 2.5 MCG/ACT AERS Inhale 2 puffs into the lungs daily. 4 each 3   Vitamin D, Ergocalciferol, (DRISDOL) 1.25 MG (50000 UNIT) CAPS capsule Take 1 capsule by mouth on Sundays at bedtime. 12 capsule 0   No current facility-administered medications for this encounter.    Physical Exam: BP (!) 148/80   Pulse 100   Ht 5\' 9"  (1.753 m)   Wt 92.4 kg   BMI 30.07 kg/m   GEN: Well nourished, well developed in no acute distress NECK: No JVD; No carotid bruits CARDIAC: Irregularly irregular rate and rhythm, no rubs, gallops, 1-2/6 systolic murmur  RESPIRATORY:  Clear to auscultation without rales, wheezing or rhonchi  ABDOMEN: Soft, non-tender, non-distended EXTREMITIES:  No edema; No deformity    Wt Readings from Last 3 Encounters:  08/31/23 92.4 kg  07/29/23 93.4 kg  07/16/23 94.8 kg     EKG today demonstrates  Afib, PVCs Vent. rate 100 BPM PR  interval * ms QRS duration 100 ms QT/QTcB 350/451 ms   Echo 07/19/23  1. Left ventricular ejection fraction, by estimation, is 55 to 60%. Left  ventricular ejection fraction by 3D volume is 58 %. The left ventricle has  normal function. The left ventricle has no regional wall motion  abnormalities. There is mild left ventricular hypertrophy. Left ventricular diastolic parameters are indeterminate.   2. Right ventricular systolic function is normal. The right ventricular  size is mildly enlarged. Tricuspid regurgitation signal is inadequate for  assessing PA pressure.   3. Left atrial size was moderately dilated.   4. The mitral valve is degenerative. Trivial mitral valve regurgitation.  Severe mitral annular calcification.   5. The aortic valve is calcified. There is moderate calcification of the  aortic valve. Aortic valve regurgitation is not visualized. Moderate  aortic valve stenosis. Mild AS by gradients (Vmax 2.6 m/s, MG ),  moderate by AVA (1.0cm^2) and DI (0.33).   Low SV index (24 cc/m^2), suspect paradoxical low flow low gradient  moderate AS   6. Aortic dilatation noted. There is mild dilatation of the ascending  aorta, measuring 38 mm.   7. The inferior vena cava is normal in size with greater than 50%  respiratory variability, suggesting right atrial pressure of 3 mmHg.    CHA2DS2-VASc Score = 4  The patient's score is based upon: CHF History: 0 HTN History: 1 Diabetes History: 1 Stroke History: 0 Vascular Disease History: 1 Age Score: 0 Gender Score: 1       ASSESSMENT AND PLAN: Persistent Atrial Fibrillation (ICD10:  I48.19) The patient's CHA2DS2-VASc score is 4, indicating a 4.8% annual risk of stroke.   Cardiac monitor showed 100% afib burden. Patient scheduled for DCCV on 09/06/23 Check bmet/cbc today Continue diltiazem 120 mg daily Continue Eliquis 5 mg BID. Patient denies any missed doses in the past 3 weeks.   Secondary Hypercoagulable State  (ICD10:  D68.69) The patient is at significant risk for stroke/thromboembolism based upon her CHA2DS2-VASc Score of 4.  Continue Apixaban (Eliquis).   HTN Stable on current regimen  OSA  Patient diagnosed remotely but has not been on CPAP for years. Referred to sleep medicine at a previous visit.   CAD CAC score 1238 on CT No anginal symptoms Followed by Dr Allyson Sabal  VHD Moderate AS Followed by Dr Allyson Sabal    Follow up with Dr Allyson Sabal as scheduled. AF clinic in 6 months.      Informed Consent   Shared Decision Making/Informed Consent The risks (stroke, cardiac arrhythmias rarely resulting in the need for a temporary or permanent pacemaker, skin irritation or burns and complications associated with conscious sedation including aspiration, arrhythmia, respiratory failure and death), benefits (restoration of normal sinus rhythm) and alternatives of a direct current cardioversion were explained in detail to Ms. Glynn and she agrees to proceed.       Jorja Loa PA-C Afib Clinic [facility] [address] [place], [place] [address] [phone]

## 2023-09-03 NOTE — Progress Notes (Signed)
 Called patient with pre-procedure instructions for Monday September 06, 2023. Left detailed message.  Patient informed of:   Time to arrive for procedure. 0945 Remain NPO past midnight.  Must have a ride home and a responsible adult to remain with them for 24 hours post procedure.  Confirmed blood thinner. Eliquis Confirmed no breaks in taking blood thinner for 3+ weeks prior to procedure. Confirmed patient stopped all GLP-1s and GLP-2s for at least one week before procedure.

## 2023-09-06 ENCOUNTER — Ambulatory Visit (HOSPITAL_COMMUNITY)
Admission: RE | Admit: 2023-09-06 | Discharge: 2023-09-06 | Disposition: A | Discharge status: Home / Self Care | Payer: Medicare HMO | Attending: Cardiovascular Disease | Admitting: Cardiovascular Disease

## 2023-09-06 ENCOUNTER — Other Ambulatory Visit: Payer: Self-pay | Admitting: Family Medicine

## 2023-09-06 ENCOUNTER — Ambulatory Visit (HOSPITAL_COMMUNITY): Admitting: Certified Registered Nurse Anesthetist

## 2023-09-06 ENCOUNTER — Other Ambulatory Visit: Payer: Self-pay

## 2023-09-06 ENCOUNTER — Encounter (HOSPITAL_COMMUNITY)
Admission: RE | Disposition: A | Discharge status: Home / Self Care | Payer: Self-pay | Source: Home / Self Care | Attending: Cardiovascular Disease

## 2023-09-06 DIAGNOSIS — Z87891 Personal history of nicotine dependence: Secondary | ICD-10-CM | POA: Insufficient documentation

## 2023-09-06 DIAGNOSIS — D6869 Other thrombophilia: Secondary | ICD-10-CM | POA: Diagnosis not present

## 2023-09-06 DIAGNOSIS — Z7984 Long term (current) use of oral hypoglycemic drugs: Secondary | ICD-10-CM | POA: Insufficient documentation

## 2023-09-06 DIAGNOSIS — E119 Type 2 diabetes mellitus without complications: Secondary | ICD-10-CM | POA: Diagnosis not present

## 2023-09-06 DIAGNOSIS — I4819 Other persistent atrial fibrillation: Secondary | ICD-10-CM | POA: Insufficient documentation

## 2023-09-06 DIAGNOSIS — I10 Essential (primary) hypertension: Secondary | ICD-10-CM | POA: Insufficient documentation

## 2023-09-06 DIAGNOSIS — G4733 Obstructive sleep apnea (adult) (pediatric): Secondary | ICD-10-CM | POA: Insufficient documentation

## 2023-09-06 DIAGNOSIS — J449 Chronic obstructive pulmonary disease, unspecified: Secondary | ICD-10-CM | POA: Diagnosis not present

## 2023-09-06 DIAGNOSIS — I4891 Unspecified atrial fibrillation: Secondary | ICD-10-CM

## 2023-09-06 DIAGNOSIS — Z9981 Dependence on supplemental oxygen: Secondary | ICD-10-CM | POA: Insufficient documentation

## 2023-09-06 DIAGNOSIS — E785 Hyperlipidemia, unspecified: Secondary | ICD-10-CM | POA: Diagnosis not present

## 2023-09-06 DIAGNOSIS — I2510 Atherosclerotic heart disease of native coronary artery without angina pectoris: Secondary | ICD-10-CM | POA: Insufficient documentation

## 2023-09-06 DIAGNOSIS — Z7901 Long term (current) use of anticoagulants: Secondary | ICD-10-CM | POA: Diagnosis not present

## 2023-09-06 DIAGNOSIS — Z79899 Other long term (current) drug therapy: Secondary | ICD-10-CM | POA: Diagnosis not present

## 2023-09-06 DIAGNOSIS — F418 Other specified anxiety disorders: Secondary | ICD-10-CM

## 2023-09-06 DIAGNOSIS — I480 Paroxysmal atrial fibrillation: Secondary | ICD-10-CM

## 2023-09-06 SURGERY — CARDIOVERSION (CATH LAB)
Anesthesia: Monitor Anesthesia Care

## 2023-09-06 MED ORDER — SODIUM CHLORIDE 0.9% FLUSH: 3.0000 mL | INTRAVENOUS | Status: DC | PRN

## 2023-09-06 MED ORDER — LIDOCAINE 2% (20 MG/ML) 5 ML SYRINGE
INTRAMUSCULAR | Status: DC | PRN
  Administered 2023-09-06: 40 mg via INTRAVENOUS

## 2023-09-06 MED ORDER — SODIUM CHLORIDE 0.9% FLUSH: 3.0000 mL | Freq: Two times a day (BID) | INTRAVENOUS | Status: DC

## 2023-09-06 MED ORDER — PROPOFOL 10 MG/ML IV BOLUS
INTRAVENOUS | Status: DC | PRN
  Administered 2023-09-06: 40 mg via INTRAVENOUS

## 2023-09-06 SURGICAL SUPPLY — 1 items: PAD DEFIB RADIO PHYSIO CONN (PAD) ×1 IMPLANT

## 2023-09-06 NOTE — Discharge Instructions (Signed)

## 2023-09-06 NOTE — Transfer of Care (Signed)
 Immediate Anesthesia Transfer of Care Note  Patient: Katelyn Cline  Procedure(s) Performed: CARDIOVERSION  Patient Location: PACU  Anesthesia Type:General  Level of Consciousness: awake and alert   Airway & Oxygen Therapy: Patient Spontanous Breathing and Patient connected to nasal cannula oxygen  Post-op Assessment: Report given to RN and Post -op Vital signs reviewed and stable  Post vital signs: Reviewed and stable  Last Vitals:  Vitals Value Taken Time  BP    Temp    Pulse    Resp    SpO2      Last Pain:  Vitals:   09/06/23 0947  TempSrc:   PainSc: 0-No pain         Complications: No notable events documented.

## 2023-09-06 NOTE — Anesthesia Preprocedure Evaluation (Addendum)
 Anesthesia Evaluation  Patient identified by MRN, date of birth, ID band Patient awake    Reviewed: Allergy & Precautions, NPO status , Patient's Chart, lab work & pertinent test results  Airway Mallampati: III       Dental  (+) Upper Dentures, Lower Dentures   Pulmonary sleep apnea , COPD,  COPD inhaler and oxygen dependent, Current Smoker and Patient abstained from smoking.   Pulmonary exam normal        Cardiovascular hypertension, Pt. on medications Normal cardiovascular exam     Neuro/Psych  PSYCHIATRIC DISORDERS Anxiety Depression     Neuromuscular disease    GI/Hepatic negative GI ROS, Neg liver ROS,,,  Endo/Other  diabetes, Oral Hypoglycemic Agents    Renal/GU negative Renal ROS     Musculoskeletal  (+) Arthritis ,  Fibromyalgia -Scoliosis   Abdominal  (+) + obese  Peds  Hematology  (+) Blood dyscrasia (Eliquis)   Anesthesia Other Findings A-FIB  Reproductive/Obstetrics                             Anesthesia Physical Anesthesia Plan  ASA: 4  Anesthesia Plan: MAC   Post-op Pain Management:    Induction:   PONV Risk Score and Plan: 1 and Propofol infusion and Treatment may vary due to age or medical condition  Airway Management Planned: Nasal Cannula  Additional Equipment:   Intra-op Plan:   Post-operative Plan:   Informed Consent: I have reviewed the patients History and Physical, chart, labs and discussed the procedure including the risks, benefits and alternatives for the proposed anesthesia with the patient or authorized representative who has indicated his/her understanding and acceptance.     Dental advisory given  Plan Discussed with: CRNA  Anesthesia Plan Comments:        Anesthesia Quick Evaluation

## 2023-09-06 NOTE — Op Note (Signed)
 Procedure: Electrical Cardioversion Indications:  Atrial Fibrillation  Procedure Details:  Consent: Risks of procedure as well as the alternatives and risks of each were explained to the (patient/caregiver).  Consent for procedure obtained.  Time Out: Verified patient identification, verified procedure, site/side was marked, verified correct patient position, special equipment/implants available, medications/allergies/relevent history reviewed, required imaging and test results available.  Performed  Patient placed on cardiac monitor, pulse oximetry, supplemental oxygen as necessary.  Sedation given:  propofol IV, Dr. Bradley Ferris Pacer pads placed anterior and posterior chest.  Cardioverted 1 time(s).  Cardioversion with synchronized biphasic 200J shock.  Evaluation: Findings: Post procedure EKG shows: NSR (after a long period of accelerated junctional rhythm at 60 bpm) Complications: None Patient did tolerate procedure well.  Time Spent Directly with the Patient:  30 minutes   Katelyn Cline 09/06/2023, 10:06 AM

## 2023-09-06 NOTE — Telephone Encounter (Signed)
 Left message for pt to call back to schedule office visit after Cardiac PET scan.

## 2023-09-06 NOTE — Interval H&P Note (Signed)
 History and Physical Interval Note:  09/06/2023 9:36 AM  Katelyn Cline  has presented today for surgery, with the diagnosis of AFIB.  The various methods of treatment have been discussed with the patient and family. After consideration of risks, benefits and other options for treatment, the patient has consented to  Procedure(s): CARDIOVERSION (N/A) as a surgical intervention.  The patient's history has been reviewed, patient examined, no change in status, stable for surgery.  I have reviewed the patient's chart and labs.  Questions were answered to the patient's satisfaction.     Faustina Gebert

## 2023-09-07 ENCOUNTER — Encounter (HOSPITAL_COMMUNITY): Payer: Self-pay | Admitting: Cardiovascular Disease

## 2023-09-07 ENCOUNTER — Other Ambulatory Visit: Payer: Self-pay | Admitting: *Deleted

## 2023-09-07 DIAGNOSIS — I480 Paroxysmal atrial fibrillation: Secondary | ICD-10-CM

## 2023-09-07 MED ORDER — ELIQUIS 5 MG PO TABS: 5.0000 mg | ORAL_TABLET | Freq: Two times a day (BID) | ORAL | 0 refills | Status: DC

## 2023-09-07 MED ORDER — DILTIAZEM HCL ER COATED BEADS 120 MG PO CP24: 120.0000 mg | ORAL_CAPSULE | Freq: Every day | ORAL | 0 refills | Status: DC

## 2023-09-07 MED ORDER — VITAMIN D (ERGOCALCIFEROL) 1.25 MG (50000 UNIT) PO CAPS: ORAL_CAPSULE | ORAL | 0 refills | Status: DC

## 2023-09-07 NOTE — Anesthesia Postprocedure Evaluation (Signed)
 Anesthesia Post Note  Patient: Katelyn Cline  Procedure(s) Performed: CARDIOVERSION     Patient location during evaluation: Cath Lab Anesthesia Type: MAC Level of consciousness: awake Pain management: pain level controlled Vital Signs Assessment: post-procedure vital signs reviewed and stable Respiratory status: spontaneous breathing, nonlabored ventilation and respiratory function stable Cardiovascular status: blood pressure returned to baseline and stable Postop Assessment: no apparent nausea or vomiting Anesthetic complications: no   No notable events documented.  Last Vitals:  Vitals:   09/06/23 0930 09/06/23 1014  BP: 107/79   Pulse: 84   Resp: 14   Temp: 36.5 C 36.7 C  SpO2: 90%     Last Pain:  Vitals:   09/06/23 1014  TempSrc: Temporal  PainSc: 0-No pain   Pain Goal:                   Catheryn Bacon Guynell Kleiber

## 2023-09-08 ENCOUNTER — Ambulatory Visit: Payer: Medicare HMO | Attending: Cardiovascular Disease | Admitting: Cardiovascular Disease

## 2023-09-08 ENCOUNTER — Encounter: Payer: Self-pay | Admitting: Cardiovascular Disease

## 2023-09-08 VITALS — BP 142/58 | HR 81 | Ht 68.0 in | Wt 207.2 lb

## 2023-09-08 DIAGNOSIS — E782 Mixed hyperlipidemia: Secondary | ICD-10-CM | POA: Diagnosis not present

## 2023-09-08 DIAGNOSIS — I10 Essential (primary) hypertension: Secondary | ICD-10-CM

## 2023-09-08 DIAGNOSIS — F17200 Nicotine dependence, unspecified, uncomplicated: Secondary | ICD-10-CM | POA: Diagnosis not present

## 2023-09-08 DIAGNOSIS — R931 Abnormal findings on diagnostic imaging of heart and coronary circulation: Secondary | ICD-10-CM | POA: Insufficient documentation

## 2023-09-08 DIAGNOSIS — I4819 Other persistent atrial fibrillation: Secondary | ICD-10-CM | POA: Diagnosis not present

## 2023-09-08 DIAGNOSIS — I739 Peripheral vascular disease, unspecified: Secondary | ICD-10-CM | POA: Insufficient documentation

## 2023-09-08 DIAGNOSIS — I350 Nonrheumatic aortic (valve) stenosis: Secondary | ICD-10-CM | POA: Insufficient documentation

## 2023-09-08 NOTE — Patient Instructions (Addendum)
 Medication Instructions:  Your physician recommends that you continue on your current medications as directed. Please refer to the Current Medication list given to you today.  *If you need a refill on your cardiac medications before your next appointment, please call your pharmacy*   Lab Work: Your physician recommends that you have labs drawn today: Lipid/liver panel  If you have labs (blood work) drawn today and your tests are completely normal, you will receive your results only by: MyChart Message (if you have MyChart) OR A paper copy in the mail If you have any lab test that is abnormal or we need to change your treatment, we will call you to review the results.   Testing/Procedures: Your physician has requested that you have a lower extremity arterial duplex. During this test, ultrasound is used to evaluate arterial blood flow in the legs. Allow one hour for this exam. There are no restrictions or special instructions. This will take place at [address] [facility], [address].  Please note: We ask at that you not bring children with you during ultrasound (echo/ vascular) testing. Due to room size and safety concerns, children are not allowed in the ultrasound rooms during exams. Our front office staff cannot provide observation of children in our lobby area while testing is being conducted. An adult accompanying a patient to their appointment will only be allowed in the ultrasound room at the discretion of the ultrasound technician under special circumstances. We apologize for any inconvenience.  Your physician has requested that you have an ankle brachial index (ABI). During this test an ultrasound and blood pressure cuff are used to evaluate the arteries that supply the arms and legs with blood. Allow thirty minutes for this exam. There are no restrictions or special instructions. This will take place at [address] [facility], [address].    Please note: We ask at that you not bring children  with you during ultrasound (echo/ vascular) testing. Due to room size and safety concerns, children are not allowed in the ultrasound rooms during exams. Our front office staff cannot provide observation of children in our lobby area while testing is being conducted. An adult accompanying a patient to their appointment will only be allowed in the ultrasound room at the discretion of the ultrasound technician under special circumstances. We apologize for any inconvenience.   Your physician has requested that you have an echocardiogram. Echocardiography is a painless test that uses sound waves to create images of your heart. It provides your doctor with information about the size and shape of your heart and how well your heart's chambers and valves are working. This procedure takes approximately one hour. There are no restrictions for this procedure. Please do NOT wear cologne, perfume, aftershave, or lotions (deodorant is allowed). Please arrive 15 minutes prior to your appointment time. **To be done in January 2026**  Please note: We ask at that you not bring children with you during ultrasound (echo/ vascular) testing. Due to room size and safety concerns, children are not allowed in the ultrasound rooms during exams. Our front office staff cannot provide observation of children in our lobby area while testing is being conducted. An adult accompanying a patient to their appointment will only be allowed in the ultrasound room at the discretion of the ultrasound technician under special circumstances. We apologize for any inconvenience.    Follow-Up: At [facility], you and your health needs are our priority.  As part of our continuing mission to provide you  with exceptional heart care, we have created designated Provider Care Teams.  These Care Teams include your primary Cardiologist (physician) and Advanced Practice Providers (APPs -  Physician Assistants and Nurse Practitioners) who all work  together to provide you with the care you need, when you need it.  We recommend signing up for the patient portal called "MyChart".  Sign up information is provided on this After Visit Summary.  MyChart is used to connect with patients for Virtual Visits (Telemedicine).  Patients are able to view lab/test results, encounter notes, upcoming appointments, etc.  Non-urgent messages can be sent to your provider as well.   To learn more about what you can do with MyChart, go to [email or web address].    Your next appointment:   3 month(s)  Provider:   Marjie Skiff, PA-C, Robet Leu, PA-C, Azalee Course, PA-C, Bernadene Person, NP, or Reather Littler, NP       Then, Nanetta Batty, MD will plan to see you again in 12 month(s).   Other Instructions   1st Floor: - Lobby - Registration  - Pharmacy  - Lab - Cafe  2nd Floor: - PV Lab - Diagnostic Testing (echo, CT, nuclear med)  3rd Floor: - Vacant  4th Floor: - TCTS (cardiothoracic surgery) - AFib Clinic - Structural Heart Clinic - Vascular Surgery  - Vascular Ultrasound  5th Floor: - HeartCare Cardiology (general and EP) - Clinical Pharmacy for coumadin, hypertension, lipid, weight-loss medications, and med management appointments    Valet parking services will be available as well.

## 2023-09-08 NOTE — Assessment & Plan Note (Signed)
 History of hyperlipidemia on atorvastatin 80 mg a day as well as Zetia and fenofibrate with lipid profile performed 11/17/2022 revealing total cholesterol 180, LDL 72 and HDL 39.  We will repeat a lipid liver profile today.

## 2023-09-08 NOTE — Assessment & Plan Note (Signed)
 Patient complains of lifestyle-limiting claudication.  Will check lower extremity arterial Doppler studies to further evaluate.

## 2023-09-08 NOTE — Assessment & Plan Note (Signed)
Ongoing tobacco abuse of 1/2 pack/day recalcitrant to resector modification. 

## 2023-09-08 NOTE — Assessment & Plan Note (Signed)
 Coronary calcium score performed 07/19/2023 was 1238 with calcium distributed in all 3 coronary arteries.  She denies chest pain.  She does have a cardiac PET scan scheduled in the near future.

## 2023-09-08 NOTE — H&P (View-Only) (Signed)
 09/08/2023 Katelyn Cline   June 16, 1961  [id number]  Primary Physician [name], MD Primary Cardiologist: Runell Gess MD Nicholes Calamity, [place]  HPI:  Katelyn Cline is a 63 y.o.  moderately overweight divorced Caucasian female mother of 1 son who was referred to me by Dr. Ruthine Dose, her PCP, for atrial fibrillation.  I last saw her in the office.  She is 06/08/2023.  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  She was disabled because of panic attacks and agoraphobia.  Her risk factors include greater than 100 pack years of tobacco abuse having smoked 2 packs a day since she was 63 years old, currently smoking 1/2 pack/day.  She has been on supplemental oxygen 24/7 for the last 12 years.  She has a diagnosis of COPD.  Her other risk factors include treated diabetes, hypertension and hyperlipidemia.  She is a strong family history for heart disease with a mother who had stent, father had bypass surgery and a brother who had a myocardial infarction.   She is apparently down in [place] on vacation in [place] in September and was awakened with shortness of breath.  She was evaluated at [facility] although the specifics of the hospitalization are unclear.  She was told that she was in "heart failure" and initially they recommended cardiac catheterization but this was never performed.  She really denies chest pain.  Since I last saw her she has undergone outpatient DC cardioversion by Dr. Royann Shivers on Monday successfully to sinus rhythm.  She had a coronary calcium score performed 07/19/2023 which showed a total of 1238 with calcium distributed in all 3 coronary arteries.  A 2D echo revealed normal LV systolic function, mild concentric LVH with moderate aortic stenosis and a valve area of 1 cm.  She has a cardiac PET study scheduled next week.   Current Meds  Medication Sig   albuterol (VENTOLIN HFA) 108 (90 Base) MCG/ACT inhaler Inhale  2 puffs into the lungs every 6 (six) hours as needed for wheezing or shortness of breath (as needed).   aspirin EC 81 MG tablet Take 1 tablet (81 mg total) by mouth daily. Swallow whole.   atorvastatin (LIPITOR) 80 MG tablet TAKE ONE TABLET BY MOUTH EVERYDAY AT BEDTIME   Blood Glucose Monitoring Suppl (BLOOD GLUCOSE SYSTEM PAK) KIT Please dispense based on patient and insurance preference. Use as directed to monitor FSBS 2x daily. Dx: E11.9   buPROPion ER (WELLBUTRIN SR) 100 MG 12 hr tablet TAKE ONE TABLET BY MOUTH EVERY MORNING and TAKE ONE TABLET BY MOUTH EVERYDAY AT BEDTIME   diltiazem (CARDIZEM CD) 120 MG 24 hr capsule Take 1 capsule (120 mg total) by mouth daily.   DULoxetine (CYMBALTA) 60 MG capsule Take 1 capsule by mouth twice daily.   ELIQUIS 5 MG TABS tablet Take 1 tablet (5 mg total) by mouth 2 (two) times daily.   ezetimibe (ZETIA) 10 MG tablet Take 1 tablet (10 mg total) by mouth every morning.   fenofibrate (TRICOR) 145 MG tablet Take 1 tablet (145 mg total) by mouth daily.   fish oil-omega-3 fatty acids 1000 MG capsule Take 2 capsules (2 g total) by mouth 2 (two) times daily.   furosemide (LASIX) 20 MG tablet Take 1 tablet (20 mg total) by mouth daily as needed.   gabapentin (NEURONTIN) 100 MG capsule Take 3 capsules by mouth three times daily as needed. (Patient  taking differently: as needed.)   glucose blood (ACCU-CHEK GUIDE) test strip Use as instructed   Glucose Blood (BLOOD GLUCOSE TEST STRIPS) STRP Please dispense based on patient and insurance preference. Use as directed to monitor FSBS 2x daily. Dx: E11.9   hydrochlorothiazide (HYDRODIURIL) 25 MG tablet Take 1 tablet (25 mg total) by mouth every morning.   hydroxyurea (HYDREA) 500 MG capsule TAKE TWO CAPSULES BY MOUTH EVERYDAY AT BEDTIME. Take 1 additonal 500mg  of hydroxyurea on Monday and Thursday.   lisinopril (ZESTRIL) 5 MG tablet TAKE ONE TABLET BY MOUTH EVERYDAY AT BEDTIME   metFORMIN (GLUCOPHAGE) 1000 MG tablet TAKE ONE  TABLET BY MOUTH EVERY MORNING and TAKE ONE TABLET BY MOUTH EVERYDAY AT BEDTIME   Microlet Lancets MISC Please dispense based on patient and insurance preference. Use as directed to monitor FSBS 2x daily. Dx: E11.9   naloxone (NARCAN) nasal spray 4 mg/0.1 mL    nystatin (MYCOSTATIN/NYSTOP) powder Apply 1 Application topically 3 (three) times daily. PA Case: [id number] Approved 06/30/2019- 06/28/2020   nystatin cream (MYCOSTATIN) Apply topically twice daily.   oxyCODONE-acetaminophen (PERCOCET) 10-325 MG tablet Take 1 tablet by mouth 4 (four) times daily as needed for severe pain.   potassium chloride SA (KLOR-CON M) 20 MEQ tablet Take 1 tablet (20 mEq total) by mouth daily as needed. Take if take furosemide   senna (SENOKOT) 8.6 MG TABS tablet Take 2 tablets by mouth daily as needed for mild constipation.   Tiotropium Bromide Monohydrate (SPIRIVA RESPIMAT) 2.5 MCG/ACT AERS Inhale 2 puffs into the lungs daily.   Vitamin D, Ergocalciferol, (DRISDOL) 1.25 MG (50000 UNIT) CAPS capsule Take 1 capsule by mouth on Sundays at bedtime.     Allergies  Allergen Reactions   Contrast Media [Iodinated Contrast Media] Anaphylaxis   Erythromycin     Stomach pain   Flagyl [Metronidazole] Other (See Comments)    Generalized pain.   Latex Other (See Comments)    Was told to be careful because of Dye allergy   Other     Iodine Dye   Penicillins Other (See Comments)    Patient was an infant, no idea of reaction. Tolerates Keflex. Did it involve swelling of the face/tongue/throat, SOB, or low BP? Unknown Did it involve sudden or severe rash/hives, skin peeling, or any reaction on the inside of your mouth or nose? Unknown Did you need to seek medical attention at a hospital or doctor's office? Unknown When did it last happen?      infant If all above answers are "NO", may proceed with cephalosporin use.    Tetracyclines & Related Other (See Comments)    GI side effects    Social History   Socioeconomic  History   Marital status: Divorced    Spouse name: Not on file   Number of children: 1   Years of education: 12+   Highest education level: Not on file  Occupational History   Occupation: disabled  Tobacco Use   Smoking status: Every Day    Current packs/day: 0.50    Average packs/day: 0.5 packs/day for 50.0 years (25.0 ttl pk-yrs)    Types: Cigarettes   Smokeless tobacco: Never   Tobacco comments:    Current smoker 10 cigarettes daily 06/10/23  Vaping Use   Vaping status: Former   Quit date: 09/15/2016  Substance and Sexual Activity   Alcohol use: Yes    Alcohol/week: 1.0 standard drink of alcohol    Types: 1 Standard drinks or equivalent per week  Comment: Not weekly   Drug use: Never   Sexual activity: Not Currently    Birth control/protection: Surgical  Other Topics Concern   Not on file  Social History Narrative   Son-lives w/son.   disabled   Social Drivers of Corporate investment banker Strain: Low Risk  (12/24/2022)   Overall Financial Resource Strain (CARDIA)    Difficulty of Paying Living Expenses: Not hard at all  Food Insecurity: No Food Insecurity (12/24/2022)   Hunger Vital Sign    Worried About Running Out of Food in the Last Year: Never true    Ran Out of Food in the Last Year: Never true  Transportation Needs: No Transportation Needs (12/24/2022)   PRAPARE - Administrator, Civil Service (Medical): No    Lack of Transportation (Non-Medical): No  Physical Activity: Inactive (12/24/2022)   Exercise Vital Sign    Days of Exercise per Week: 0 days    Minutes of Exercise per Session: 0 min  Stress: Stress Concern Present (12/24/2022)   [employer] of Occupational Health - Occupational Stress Questionnaire    Feeling of Stress : Rather much  Social Connections: Socially Isolated (12/24/2022)   Social Connection and Isolation Panel [NHANES]    Frequency of Communication with Friends and Family: Once a week    Frequency of Social Gatherings  with Friends and Family: Once a week    Attends Religious Services: Never    Database administrator or Organizations: No    Attends Banker Meetings: Never    Marital Status: Divorced  Catering manager Violence: Not At Risk (12/24/2022)   Humiliation, Afraid, Rape, and Kick questionnaire    Fear of Current or Ex-Partner: No    Emotionally Abused: No    Physically Abused: No    Sexually Abused: No     Review of Systems: General: negative for chills, fever, night sweats or weight changes.  Cardiovascular: negative for chest pain, dyspnea on exertion, edema, orthopnea, palpitations, paroxysmal nocturnal dyspnea or shortness of breath Dermatological: negative for rash Respiratory: negative for cough or wheezing Urologic: negative for hematuria Abdominal: negative for nausea, vomiting, diarrhea, bright red blood per rectum, melena, or hematemesis Neurologic: negative for visual changes, syncope, or dizziness All other systems reviewed and are otherwise negative except as noted above.    Blood pressure (!) 142/58, pulse 81, height 5\' 8"  (1.727 m), weight 207 lb 3.2 oz (94 kg), SpO2 93%.  General appearance: alert and no distress Neck: no adenopathy, no carotid bruit, no JVD, supple, symmetrical, trachea midline, and thyroid not enlarged, symmetric, no tenderness/mass/nodules Lungs: clear to auscultation bilaterally Heart: Soft outflow tract murmur consistent with aortic stenosis. Extremities: extremities normal, atraumatic, no cyanosis or edema Pulses: Diminished  Skin: Skin color, texture, turgor normal. No rashes or lesions Neurologic: Grossly normal  EKG not performed today      ASSESSMENT AND PLAN:   Hyperlipidemia History of hyperlipidemia on atorvastatin 80 mg a day as well as Zetia and fenofibrate with lipid profile performed 11/17/2022 revealing total cholesterol 180, LDL 72 and HDL 39.  We will repeat a lipid liver profile today.  Tobacco use disorder Ongoing  tobacco abuse of 1/2 pack/day recalcitrant to resector modification.  Essential hypertension, benign History of essential hypertension with blood pressure measured today at 142/58.  She is on diltiazem, hydrochlorothiazide, and lisinopril.  Atrial fibrillation ([name]) History of persistent A-fib on Eliquis status post DC cardioversion 2 days ago maintaining sinus rhythm.  Elevated  coronary artery calcium score Coronary calcium score performed 07/19/2023 was 1238 with calcium distributed in all 3 coronary arteries.  She denies chest pain.  She does have a cardiac PET scan scheduled in the near future.  Aortic stenosis 2D echo performed 07/19/2023 revealed normal LV systolic function, mild concentric LVH with moderate aortic stenosis.  Aortic valve area measured 1 cm with a peak gradient of 26 mmHg.  This will need to be followed on an annual basis.  Claudication in peripheral vascular disease [facility]) Patient complains of lifestyle-limiting claudication.  Will check lower extremity arterial Doppler studies to further evaluate.     Runell Gess MD FACP,FACC,FAHA, [facility] 09/08/2023 12:30 PM

## 2023-09-08 NOTE — Assessment & Plan Note (Signed)
 2D echo performed 07/19/2023 revealed normal LV systolic function, mild concentric LVH with moderate aortic stenosis.  Aortic valve area measured 1 cm with a peak gradient of 26 mmHg.  This will need to be followed on an annual basis.

## 2023-09-08 NOTE — Assessment & Plan Note (Signed)
 History of persistent A-fib on Eliquis status post DC cardioversion 2 days ago maintaining sinus rhythm.

## 2023-09-08 NOTE — Assessment & Plan Note (Signed)
 History of essential hypertension with blood pressure measured today at 142/58.  She is on diltiazem, hydrochlorothiazide, and lisinopril.

## 2023-09-08 NOTE — Progress Notes (Signed)
 09/08/2023 [name]   June 16, 1961  [id number]  Primary Physician [name], MD Primary Cardiologist: Runell Gess MD Nicholes Calamity, [place]  HPI:  AUBRIELLA PEREZGARCIA is a 63 y.o.  moderately overweight divorced Caucasian female mother of 1 son who was referred to me by Dr. Ruthine Dose, her PCP, for atrial fibrillation.  I last saw her in the office.  She is 06/08/2023.  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  She was disabled because of panic attacks and agoraphobia.  Her risk factors include greater than 100 pack years of tobacco abuse having smoked 2 packs a day since she was 63 years old, currently smoking 1/2 pack/day.  She has been on supplemental oxygen 24/7 for the last 12 years.  She has a diagnosis of COPD.  Her other risk factors include treated diabetes, hypertension and hyperlipidemia.  She is a strong family history for heart disease with a mother who had stent, father had bypass surgery and a brother who had a myocardial infarction.   She is apparently down in [place] on vacation in [place] in September and was awakened with shortness of breath.  She was evaluated at [facility] although the specifics of the hospitalization are unclear.  She was told that she was in "heart failure" and initially they recommended cardiac catheterization but this was never performed.  She really denies chest pain.  Since I last saw her she has undergone outpatient DC cardioversion by Dr. Royann Shivers on Monday successfully to sinus rhythm.  She had a coronary calcium score performed 07/19/2023 which showed a total of 1238 with calcium distributed in all 3 coronary arteries.  A 2D echo revealed normal LV systolic function, mild concentric LVH with moderate aortic stenosis and a valve area of 1 cm.  She has a cardiac PET study scheduled next week.   Current Meds  Medication Sig   albuterol (VENTOLIN HFA) 108 (90 Base) MCG/ACT inhaler Inhale  2 puffs into the lungs every 6 (six) hours as needed for wheezing or shortness of breath (as needed).   aspirin EC 81 MG tablet Take 1 tablet (81 mg total) by mouth daily. Swallow whole.   atorvastatin (LIPITOR) 80 MG tablet TAKE ONE TABLET BY MOUTH EVERYDAY AT BEDTIME   Blood Glucose Monitoring Suppl (BLOOD GLUCOSE SYSTEM PAK) KIT Please dispense based on patient and insurance preference. Use as directed to monitor FSBS 2x daily. Dx: E11.9   buPROPion ER (WELLBUTRIN SR) 100 MG 12 hr tablet TAKE ONE TABLET BY MOUTH EVERY MORNING and TAKE ONE TABLET BY MOUTH EVERYDAY AT BEDTIME   diltiazem (CARDIZEM CD) 120 MG 24 hr capsule Take 1 capsule (120 mg total) by mouth daily.   DULoxetine (CYMBALTA) 60 MG capsule Take 1 capsule by mouth twice daily.   ELIQUIS 5 MG TABS tablet Take 1 tablet (5 mg total) by mouth 2 (two) times daily.   ezetimibe (ZETIA) 10 MG tablet Take 1 tablet (10 mg total) by mouth every morning.   fenofibrate (TRICOR) 145 MG tablet Take 1 tablet (145 mg total) by mouth daily.   fish oil-omega-3 fatty acids 1000 MG capsule Take 2 capsules (2 g total) by mouth 2 (two) times daily.   furosemide (LASIX) 20 MG tablet Take 1 tablet (20 mg total) by mouth daily as needed.   gabapentin (NEURONTIN) 100 MG capsule Take 3 capsules by mouth three times daily as needed. (Patient  taking differently: as needed.)   glucose blood (ACCU-CHEK GUIDE) test strip Use as instructed   Glucose Blood (BLOOD GLUCOSE TEST STRIPS) STRP Please dispense based on patient and insurance preference. Use as directed to monitor FSBS 2x daily. Dx: E11.9   hydrochlorothiazide (HYDRODIURIL) 25 MG tablet Take 1 tablet (25 mg total) by mouth every morning.   hydroxyurea (HYDREA) 500 MG capsule TAKE TWO CAPSULES BY MOUTH EVERYDAY AT BEDTIME. Take 1 additonal 500mg  of hydroxyurea on Monday and Thursday.   lisinopril (ZESTRIL) 5 MG tablet TAKE ONE TABLET BY MOUTH EVERYDAY AT BEDTIME   metFORMIN (GLUCOPHAGE) 1000 MG tablet TAKE ONE  TABLET BY MOUTH EVERY MORNING and TAKE ONE TABLET BY MOUTH EVERYDAY AT BEDTIME   Microlet Lancets MISC Please dispense based on patient and insurance preference. Use as directed to monitor FSBS 2x daily. Dx: E11.9   naloxone (NARCAN) nasal spray 4 mg/0.1 mL    nystatin (MYCOSTATIN/NYSTOP) powder Apply 1 Application topically 3 (three) times daily. PA Case: [id number] Approved 06/30/2019- 06/28/2020   nystatin cream (MYCOSTATIN) Apply topically twice daily.   oxyCODONE-acetaminophen (PERCOCET) 10-325 MG tablet Take 1 tablet by mouth 4 (four) times daily as needed for severe pain.   potassium chloride SA (KLOR-CON M) 20 MEQ tablet Take 1 tablet (20 mEq total) by mouth daily as needed. Take if take furosemide   senna (SENOKOT) 8.6 MG TABS tablet Take 2 tablets by mouth daily as needed for mild constipation.   Tiotropium Bromide Monohydrate (SPIRIVA RESPIMAT) 2.5 MCG/ACT AERS Inhale 2 puffs into the lungs daily.   Vitamin D, Ergocalciferol, (DRISDOL) 1.25 MG (50000 UNIT) CAPS capsule Take 1 capsule by mouth on Sundays at bedtime.     Allergies  Allergen Reactions   Contrast Media [Iodinated Contrast Media] Anaphylaxis   Erythromycin     Stomach pain   Flagyl [Metronidazole] Other (See Comments)    Generalized pain.   Latex Other (See Comments)    Was told to be careful because of Dye allergy   Other     Iodine Dye   Penicillins Other (See Comments)    Patient was an infant, no idea of reaction. Tolerates Keflex. Did it involve swelling of the face/tongue/throat, SOB, or low BP? Unknown Did it involve sudden or severe rash/hives, skin peeling, or any reaction on the inside of your mouth or nose? Unknown Did you need to seek medical attention at a hospital or doctor's office? Unknown When did it last happen?      infant If all above answers are "NO", may proceed with cephalosporin use.    Tetracyclines & Related Other (See Comments)    GI side effects    Social History   Socioeconomic  History   Marital status: Divorced    Spouse name: Not on file   Number of children: 1   Years of education: 12+   Highest education level: Not on file  Occupational History   Occupation: disabled  Tobacco Use   Smoking status: Every Day    Current packs/day: 0.50    Average packs/day: 0.5 packs/day for 50.0 years (25.0 ttl pk-yrs)    Types: Cigarettes   Smokeless tobacco: Never   Tobacco comments:    Current smoker 10 cigarettes daily 06/10/23  Vaping Use   Vaping status: Former   Quit date: 09/15/2016  Substance and Sexual Activity   Alcohol use: Yes    Alcohol/week: 1.0 standard drink of alcohol    Types: 1 Standard drinks or equivalent per week  Comment: Not weekly   Drug use: Never   Sexual activity: Not Currently    Birth control/protection: Surgical  Other Topics Concern   Not on file  Social History Narrative   Son-lives w/son.   disabled   Social Drivers of Corporate investment banker Strain: Low Risk  (12/24/2022)   Overall Financial Resource Strain (CARDIA)    Difficulty of Paying Living Expenses: Not hard at all  Food Insecurity: No Food Insecurity (12/24/2022)   Hunger Vital Sign    Worried About Running Out of Food in the Last Year: Never true    Ran Out of Food in the Last Year: Never true  Transportation Needs: No Transportation Needs (12/24/2022)   PRAPARE - Administrator, Civil Service (Medical): No    Lack of Transportation (Non-Medical): No  Physical Activity: Inactive (12/24/2022)   Exercise Vital Sign    Days of Exercise per Week: 0 days    Minutes of Exercise per Session: 0 min  Stress: Stress Concern Present (12/24/2022)   [employer] of Occupational Health - Occupational Stress Questionnaire    Feeling of Stress : Rather much  Social Connections: Socially Isolated (12/24/2022)   Social Connection and Isolation Panel [NHANES]    Frequency of Communication with Friends and Family: Once a week    Frequency of Social Gatherings  with Friends and Family: Once a week    Attends Religious Services: Never    Database administrator or Organizations: No    Attends Banker Meetings: Never    Marital Status: Divorced  Catering manager Violence: Not At Risk (12/24/2022)   Humiliation, Afraid, Rape, and Kick questionnaire    Fear of Current or Ex-Partner: No    Emotionally Abused: No    Physically Abused: No    Sexually Abused: No     Review of Systems: General: negative for chills, fever, night sweats or weight changes.  Cardiovascular: negative for chest pain, dyspnea on exertion, edema, orthopnea, palpitations, paroxysmal nocturnal dyspnea or shortness of breath Dermatological: negative for rash Respiratory: negative for cough or wheezing Urologic: negative for hematuria Abdominal: negative for nausea, vomiting, diarrhea, bright red blood per rectum, melena, or hematemesis Neurologic: negative for visual changes, syncope, or dizziness All other systems reviewed and are otherwise negative except as noted above.    Blood pressure (!) 142/58, pulse 81, height 5\' 8"  (1.727 m), weight 207 lb 3.2 oz (94 kg), SpO2 93%.  General appearance: alert and no distress Neck: no adenopathy, no carotid bruit, no JVD, supple, symmetrical, trachea midline, and thyroid not enlarged, symmetric, no tenderness/mass/nodules Lungs: clear to auscultation bilaterally Heart: Soft outflow tract murmur consistent with aortic stenosis. Extremities: extremities normal, atraumatic, no cyanosis or edema Pulses: Diminished  Skin: Skin color, texture, turgor normal. No rashes or lesions Neurologic: Grossly normal  EKG not performed today      ASSESSMENT AND PLAN:   Hyperlipidemia History of hyperlipidemia on atorvastatin 80 mg a day as well as Zetia and fenofibrate with lipid profile performed 11/17/2022 revealing total cholesterol 180, LDL 72 and HDL 39.  We will repeat a lipid liver profile today.  Tobacco use disorder Ongoing  tobacco abuse of 1/2 pack/day recalcitrant to resector modification.  Essential hypertension, benign History of essential hypertension with blood pressure measured today at 142/58.  She is on diltiazem, hydrochlorothiazide, and lisinopril.  Atrial fibrillation ([name]) History of persistent A-fib on Eliquis status post DC cardioversion 2 days ago maintaining sinus rhythm.  Elevated  coronary artery calcium score Coronary calcium score performed 07/19/2023 was 1238 with calcium distributed in all 3 coronary arteries.  She denies chest pain.  She does have a cardiac PET scan scheduled in the near future.  Aortic stenosis 2D echo performed 07/19/2023 revealed normal LV systolic function, mild concentric LVH with moderate aortic stenosis.  Aortic valve area measured 1 cm with a peak gradient of 26 mmHg.  This will need to be followed on an annual basis.  Claudication in peripheral vascular disease [facility]) Patient complains of lifestyle-limiting claudication.  Will check lower extremity arterial Doppler studies to further evaluate.     Runell Gess MD FACP,FACC,FAHA, [facility] 09/08/2023 12:30 PM

## 2023-09-09 LAB — LIPID PANEL
Chol/HDL Ratio: 3 ratio (ref 0.0–4.4)
Cholesterol, Total: 115 mg/dL (ref 100–199)
HDL: 38 mg/dL — ABNORMAL LOW (ref >39)
LDL Chol Calc (NIH): 57 mg/dL (ref 0–99)
Triglycerides: 108 mg/dL (ref 0–149)
VLDL Cholesterol Cal: 20 mg/dL (ref 5–40)

## 2023-09-09 LAB — HEPATIC FUNCTION PANEL
ALT: 11 IU/L (ref 0–32)
AST: 17 IU/L (ref 0–40)
Albumin: 3.8 g/dL — ABNORMAL LOW (ref 3.9–4.9)
Alkaline Phosphatase: 97 IU/L (ref 44–121)
Bilirubin Total: <0.2 mg/dL (ref 0.0–1.2)
Bilirubin, Direct: 0.09 mg/dL (ref 0.00–0.40)
Total Protein: 6.6 g/dL (ref 6.0–8.5)

## 2023-09-10 ENCOUNTER — Other Ambulatory Visit: Payer: Self-pay

## 2023-09-10 DIAGNOSIS — D45 Polycythemia vera: Secondary | ICD-10-CM

## 2023-09-10 NOTE — Addendum Note (Signed)
 Addended by: Runell Gess on: 09/10/2023 11:23 AM   Modules accepted: Orders

## 2023-09-13 ENCOUNTER — Inpatient Hospital Stay: Payer: Medicare HMO | Attending: Hematology

## 2023-09-13 ENCOUNTER — Telehealth (HOSPITAL_COMMUNITY): Payer: Self-pay | Admitting: *Deleted

## 2023-09-13 ENCOUNTER — Inpatient Hospital Stay (HOSPITAL_BASED_OUTPATIENT_CLINIC_OR_DEPARTMENT_OTHER): Payer: Medicare HMO | Admitting: Hematology

## 2023-09-13 DIAGNOSIS — Z85118 Personal history of other malignant neoplasm of bronchus and lung: Secondary | ICD-10-CM | POA: Insufficient documentation

## 2023-09-13 DIAGNOSIS — D471 Chronic myeloproliferative disease: Secondary | ICD-10-CM | POA: Diagnosis not present

## 2023-09-13 DIAGNOSIS — D45 Polycythemia vera: Secondary | ICD-10-CM | POA: Diagnosis not present

## 2023-09-13 DIAGNOSIS — F17210 Nicotine dependence, cigarettes, uncomplicated: Secondary | ICD-10-CM | POA: Insufficient documentation

## 2023-09-13 DIAGNOSIS — Z9981 Dependence on supplemental oxygen: Secondary | ICD-10-CM | POA: Insufficient documentation

## 2023-09-13 LAB — CMP (CANCER CENTER ONLY)
ALT: 9 U/L (ref 0–44)
AST: 11 U/L — ABNORMAL LOW (ref 15–41)
Albumin: 4.1 g/dL (ref 3.5–5.0)
Alkaline Phosphatase: 90 U/L (ref 38–126)
Anion gap: 7 (ref 5–15)
BUN: 31 mg/dL — ABNORMAL HIGH (ref 8–23)
CO2: 32 mmol/L (ref 22–32)
Calcium: 9.3 mg/dL (ref 8.9–10.3)
Chloride: 100 mmol/L (ref 98–111)
Creatinine: 0.79 mg/dL (ref 0.44–1.00)
GFR, Estimated: >60 mL/min (ref >60)
Glucose, Bld: 215 mg/dL — ABNORMAL HIGH (ref 70–99)
Potassium: 4.4 mmol/L (ref 3.5–5.1)
Sodium: 139 mmol/L (ref 135–145)
Total Bilirubin: 0.3 mg/dL (ref 0.0–1.2)
Total Protein: 7.4 g/dL (ref 6.5–8.1)

## 2023-09-13 LAB — IRON AND IRON BINDING CAPACITY (CC-WL,HP ONLY)
Iron: 33 ug/dL (ref 28–170)
Saturation Ratios: 5 % — ABNORMAL LOW (ref 10.4–31.8)
TIBC: 606 ug/dL — ABNORMAL HIGH (ref 250–450)
UIBC: 573 ug/dL — ABNORMAL HIGH (ref 148–442)

## 2023-09-13 LAB — CBC WITH DIFFERENTIAL (CANCER CENTER ONLY)
Abs Immature Granulocytes: 0.15 10*3/uL — ABNORMAL HIGH (ref 0.00–0.07)
Basophils Absolute: 0.2 10*3/uL — ABNORMAL HIGH (ref 0.0–0.1)
Basophils Relative: 1 %
Eosinophils Absolute: 0.2 10*3/uL (ref 0.0–0.5)
Eosinophils Relative: 1 %
HCT: 42.2 % (ref 36.0–46.0)
Hemoglobin: 12.2 g/dL (ref 12.0–15.0)
Immature Granulocytes: 1 %
Lymphocytes Relative: 7 %
Lymphs Abs: 1.1 10*3/uL (ref 0.7–4.0)
MCH: 25.4 pg — ABNORMAL LOW (ref 26.0–34.0)
MCHC: 28.9 g/dL — ABNORMAL LOW (ref 30.0–36.0)
MCV: 87.7 fL (ref 80.0–100.0)
Monocytes Absolute: 0.4 10*3/uL (ref 0.1–1.0)
Monocytes Relative: 3 %
Neutro Abs: 13.9 10*3/uL — ABNORMAL HIGH (ref 1.7–7.7)
Neutrophils Relative %: 87 %
Platelet Count: 560 10*3/uL — ABNORMAL HIGH (ref 150–400)
RBC: 4.81 MIL/uL (ref 3.87–5.11)
RDW: 19.2 % — ABNORMAL HIGH (ref 11.5–15.5)
WBC Count: 16 10*3/uL — ABNORMAL HIGH (ref 4.0–10.5)
nRBC: 0.2 % (ref 0.0–0.2)

## 2023-09-13 LAB — FERRITIN: Ferritin: 16 ng/mL (ref 11–307)

## 2023-09-13 MED ORDER — HYDROXYUREA 500 MG PO CAPS: ORAL_CAPSULE | ORAL | 3 refills | Status: DC

## 2023-09-13 NOTE — Telephone Encounter (Signed)
 Reaching out to patient to offer assistance regarding upcoming cardiac imaging study; pt verbalizes understanding of appt date/time, parking situation and where to check in, pre-test NPO status and medications ordered, and verified current allergies; name and call back number provided for further questions should they arise Katelyn Frame RN Navigator Cardiac Imaging [facility] [phone] office [phone] cell  Patient aware to avoid caffeine for 12 hours prior to test.

## 2023-09-13 NOTE — Progress Notes (Signed)
 HEMATOLOGY/ONCOLOGY CLINIC NOTE  Date of Service: 09/13/23   Patient Care Team: [name], MD as PCP - General (Family Medicine) Runell Gess, MD as PCP - Cardiology (Cardiology) [name], MD as Consulting Physician (Hematology) [facility] (Inactive) as Pharmacist (Pharmacist) Mateo Flow, MD as Consulting Physician (Ophthalmology) Runell Gess, MD as Consulting Physician (Cardiology)  CHIEF COMPLAINTS/PURPOSE OF CONSULTATION:  Evaluation and management of polycythemia vera  HISTORY OF PRESENTING ILLNESS:  Please see previous note for details of initial presentation  INTERVAL HISTORY:   Katelyn Cline is here for continued evaluation and management of her polycythemia vera.   Patient was last seen by me on 06/15/2023 and sh was doing well overall.   Patient notes she has been doing well overall since our last visit. She had cardioversion last week. She has not noticed any difference, yet. Patient regularly follows-up with her Cardiologist. She is scheduled for PET CT Cardiac perfusion on 09/14/2023.   She regularly takes Eliquis.   Patient notes that she regularly takes her hydroxyurea without any missed dosage.   She denies any new infection issues, fever, chills, night sweats, unexpected weight loss, back pain, chest pain, abdominal pain, or leg swelling. She denies any abnormal bleeding.   MEDICAL HISTORY:  Past Medical History:  Diagnosis Date   Agoraphobia    Anxiety    Arthritis    "all joints"   Chronic low back pain    Chronic respiratory failure with hypoxia ([name])    4L  via [place],  followed by pcp,   (09-16-2018  per pt only uses while at home and at night ,  portable oxygen is not working)   Clotting disorder ([name]) 2013   Polycythemia vera   Colon polyps    COPD (chronic obstructive pulmonary disease) ([name])    DDD (degenerative disc disease), lumbosacral    Dental caries    DM type 2 (diabetes mellitus,  type 2) ([name])    followed by pcp   Fibromyalgia    GERD (gastroesophageal reflux disease)    occasional , no meds   Heart murmur 2020   History of basal cell carcinoma (BCC) excision    s/p  Moh's of face/ nose 12/ 2013   History of palpitations    per pt found to have occaional PVCs   History of UTI    HTN (hypertension)    Hyperlipidemia    Loose, teeth    Major depression    Metastatic melanoma ([name]) 10/18/2012   12 mm posterior right upper lobe pulmonary nodule, max SUV 3.0  s/p  right wedge resection 10-21-2012,  followed by oncologist-- dr Candise Che,  no recurrence   Mixed stress and urge urinary incontinence    Myeloproliferative neoplasm ([name])    Narcolepsy    Obesity    OSA (obstructive sleep apnea)    Oxygen deficiency    Oxygen dependent    4 L via [place],  per pt portable oxygen not working but does use while at home and at night   Panic attacks    Periodontitis    Peripheral neuropathy    feet   Polycythemia vera(238.4) hematology/ oncology-- dr Candise Che ([facility])   first dx 02014 due to smoker/copd--  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)  hx phlebotomies   Pulmonary nodules    bilateral small stable per CT 03-11-2018   Scoliosis    Sleep apnea    Smokers' cough ([name])  per pt productive a little in the morning's   Symptoms of upper respiratory infection (URI) 03/12/2018   Urinary (tract) obstruction    Wears glasses     SURGICAL HISTORY: Past Surgical History:  Procedure Laterality Date   ABDOMINAL HYSTERECTOMY  1998   partial   BREAST LUMPECTOMY Bilateral 1995   benign per pt   CARDIOVERSION N/A 09/06/2023   Procedure: CARDIOVERSION;  Surgeon: Thurmon Fair, MD;  Location: [facility] INVASIVE CV LAB;  Service: Cardiovascular;  Laterality: N/A;   COLONOSCOPY W/ BIOPSIES AND POLYPECTOMY     Hx: of   CYSTECTOMY  2000   abdominal wall    DILATION AND CURETTAGE OF UTERUS  yrs ago   MOHS SURGERY  2013   nose/face   MULTIPLE EXTRACTIONS  WITH ALVEOLOPLASTY N/A 09/19/2018   Procedure: Extraction of tooth #'s 2, 3, 6-14, 18, and 20-30 with alveoloplasty;  Surgeon: Charlynne Pander, DDS;  Location: [facility] ORS;  Service: Oral Surgery;  Laterality: N/A;  GENERAL WITH NASAL TUBE   VIDEO ASSISTED THORACOSCOPY (VATS)/WEDGE RESECTION Right 10/21/2012   Procedure: VIDEO ASSISTED THORACOSCOPY (VATS)/WEDGE RESECTION;  Surgeon: Delight Ovens, MD;  Location: [facility] OR;  Service: Thoracic;  Laterality: Right;   VIDEO BRONCHOSCOPY N/A 10/21/2012   Procedure: VIDEO BRONCHOSCOPY;  Surgeon: Delight Ovens, MD;  Location: [facility] OR;  Service: Thoracic;  Laterality: N/A;    SOCIAL HISTORY: Social History   Socioeconomic History   Marital status: Divorced    Spouse name: Not on file   Number of children: 1   Years of education: 12+   Highest education level: Not on file  Occupational History   Occupation: disabled  Tobacco Use   Smoking status: Every Day    Current packs/day: 0.50    Average packs/day: 0.5 packs/day for 50.0 years (25.0 ttl pk-yrs)    Types: Cigarettes   Smokeless tobacco: Never   Tobacco comments:    Current smoker 10 cigarettes daily 06/10/23  Vaping Use   Vaping status: Former   Quit date: 09/15/2016  Substance and Sexual Activity   Alcohol use: Yes    Alcohol/week: 1.0 standard drink of alcohol    Types: 1 Standard drinks or equivalent per week    Comment: Not weekly   Drug use: Never   Sexual activity: Not Currently    Birth control/protection: Surgical  Other Topics Concern   Not on file  Social History Narrative   Son-lives w/son.   disabled   Social Drivers of Corporate investment banker Strain: Low Risk  (12/24/2022)   Overall Financial Resource Strain (CARDIA)    Difficulty of Paying Living Expenses: Not hard at all  Food Insecurity: No Food Insecurity (12/24/2022)   Hunger Vital Sign    Worried About Running Out of Food in the Last Year: Never true    Ran Out of Food in the Last Year: Never true   Transportation Needs: No Transportation Needs (12/24/2022)   PRAPARE - Administrator, Civil Service (Medical): No    Lack of Transportation (Non-Medical): No  Physical Activity: Inactive (12/24/2022)   Exercise Vital Sign    Days of Exercise per Week: 0 days    Minutes of Exercise per Session: 0 min  Stress: Stress Concern Present (12/24/2022)   [employer] of Occupational Health - Occupational Stress Questionnaire    Feeling of Stress : Rather much  Social Connections: Socially Isolated (12/24/2022)   Social Connection and Isolation Panel [NHANES]    Frequency of  Communication with Friends and Family: Once a week    Frequency of Social Gatherings with Friends and Family: Once a week    Attends Religious Services: Never    Database administrator or Organizations: No    Attends Banker Meetings: Never    Marital Status: Divorced  Catering manager Violence: Not At Risk (12/24/2022)   Humiliation, Afraid, Rape, and Kick questionnaire    Fear of Current or Ex-Partner: No    Emotionally Abused: No    Physically Abused: No    Sexually Abused: No    FAMILY HISTORY: Family History  Problem Relation Age of Onset   Arthritis Mother    Hyperlipidemia Mother    Depression Mother    Anxiety disorder Mother    Dementia Mother    Hypertension Father    Hyperlipidemia Father    Heart disease Father    Stroke Father    Dementia Father    Heart disease Brother    ADD / ADHD Son    Alcohol abuse Maternal Grandfather    Bipolar disorder Neg Hx    Drug abuse Neg Hx    OCD Neg Hx    Paranoid behavior Neg Hx    Schizophrenia Neg Hx    Seizures Neg Hx    Sexual abuse Neg Hx    Physical abuse Neg Hx     ALLERGIES:  is allergic to contrast media [iodinated contrast media], erythromycin, flagyl [metronidazole], latex, other, penicillins, and tetracyclines & related.  MEDICATIONS:  Current Outpatient Medications  Medication Sig Dispense Refill   albuterol  (VENTOLIN HFA) 108 (90 Base) MCG/ACT inhaler Inhale 2 puffs into the lungs every 6 (six) hours as needed for wheezing or shortness of breath (as needed). 17 each 3   aspirin EC 81 MG tablet Take 1 tablet (81 mg total) by mouth daily. Swallow whole. 100 tablet 4   atorvastatin (LIPITOR) 80 MG tablet TAKE ONE TABLET BY MOUTH EVERYDAY AT BEDTIME 90 tablet 1   Blood Glucose Monitoring Suppl (BLOOD GLUCOSE SYSTEM PAK) KIT Please dispense based on patient and insurance preference. Use as directed to monitor FSBS 2x daily. Dx: E11.9 1 kit 1   buPROPion ER (WELLBUTRIN SR) 100 MG 12 hr tablet TAKE ONE TABLET BY MOUTH EVERY MORNING and TAKE ONE TABLET BY MOUTH EVERYDAY AT BEDTIME 180 tablet 1   diltiazem (CARDIZEM CD) 120 MG 24 hr capsule Take 1 capsule (120 mg total) by mouth daily. 90 capsule 0   DULoxetine (CYMBALTA) 60 MG capsule Take 1 capsule by mouth twice daily. 180 capsule 0   ELIQUIS 5 MG TABS tablet Take 1 tablet (5 mg total) by mouth 2 (two) times daily. 180 tablet 0   ezetimibe (ZETIA) 10 MG tablet Take 1 tablet (10 mg total) by mouth every morning. 90 tablet 1   fenofibrate (TRICOR) 145 MG tablet Take 1 tablet (145 mg total) by mouth daily. 90 tablet 1   fish oil-omega-3 fatty acids 1000 MG capsule Take 2 capsules (2 g total) by mouth 2 (two) times daily. 180 capsule 1   furosemide (LASIX) 20 MG tablet Take 1 tablet (20 mg total) by mouth daily as needed. 30 tablet 3   gabapentin (NEURONTIN) 100 MG capsule Take 3 capsules by mouth three times daily as needed. (Patient taking differently: as needed.) 270 capsule 2   glucose blood (ACCU-CHEK GUIDE) test strip Use as instructed 100 each 12   Glucose Blood (BLOOD GLUCOSE TEST STRIPS) STRP Please dispense based  on patient and insurance preference. Use as directed to monitor FSBS 2x daily. Dx: E11.9 100 strip 1   hydrochlorothiazide (HYDRODIURIL) 25 MG tablet Take 1 tablet (25 mg total) by mouth every morning. 90 tablet 1   hydroxyurea (HYDREA) 500 MG  capsule TAKE TWO CAPSULES BY MOUTH EVERYDAY AT BEDTIME. Take 1 additonal 500mg  of hydroxyurea on Monday and Thursday. 90 capsule 3   lisinopril (ZESTRIL) 5 MG tablet TAKE ONE TABLET BY MOUTH EVERYDAY AT BEDTIME 90 tablet 1   metFORMIN (GLUCOPHAGE) 1000 MG tablet TAKE ONE TABLET BY MOUTH EVERY MORNING and TAKE ONE TABLET BY MOUTH EVERYDAY AT BEDTIME 180 tablet 1   Microlet Lancets MISC Please dispense based on patient and insurance preference. Use as directed to monitor FSBS 2x daily. Dx: E11.9 100 each 11   naloxone (NARCAN) nasal spray 4 mg/0.1 mL      nystatin (MYCOSTATIN/NYSTOP) powder Apply 1 Application topically 3 (three) times daily. PA Case: [id number] Approved 06/30/2019- 06/28/2020 60 g 2   nystatin cream (MYCOSTATIN) Apply topically twice daily. 60 g 1   oxyCODONE-acetaminophen (PERCOCET) 10-325 MG tablet Take 1 tablet by mouth 4 (four) times daily as needed for severe pain.     potassium chloride SA (KLOR-CON M) 20 MEQ tablet Take 1 tablet (20 mEq total) by mouth daily as needed. Take if take furosemide 30 tablet 3   senna (SENOKOT) 8.6 MG TABS tablet Take 2 tablets by mouth daily as needed for mild constipation.     Tiotropium Bromide Monohydrate (SPIRIVA RESPIMAT) 2.5 MCG/ACT AERS Inhale 2 puffs into the lungs daily. 4 each 3   Vitamin D, Ergocalciferol, (DRISDOL) 1.25 MG (50000 UNIT) CAPS capsule Take 1 capsule by mouth on Sundays at bedtime. 12 capsule 0   No current facility-administered medications for this visit.    REVIEW OF SYSTEMS:    10 Point review of Systems was done is negative except as noted above.   PHYSICAL EXAMINATION:  .BP (!) 148/70 (BP Location: Left Arm, Patient Position: Sitting)   Pulse 68   Temp 97.6 F (36.4 C) (Temporal)   Resp 17   Wt 202 lb 3.2 oz (91.7 kg)   SpO2 100%   BMI 30.74 kg/m   GENERAL:alert, in no acute distress and comfortable SKIN: no acute rashes, no significant lesions EYES: conjunctiva are pink and non-injected, sclera  anicteric OROPHARYNX: MMM, no exudates, no oropharyngeal erythema or ulceration NECK: supple, no JVD LYMPH:  no palpable lymphadenopathy in the cervical, axillary or inguinal regions LUNGS: clear to auscultation b/l with normal respiratory effort HEART: regular rate & rhythm ABDOMEN:  normoactive bowel sounds , non tender, not distended. Extremity: no pedal edema PSYCH: alert & oriented x 3 with fluent speech NEURO: no focal motor/sensory deficits   LABORATORY DATA:  I have reviewed the data as listed  .    Latest Ref Rng & Units 09/13/2023   10:20 AM 08/31/2023    9:14 AM 07/16/2023    2:54 PM  CBC  WBC 4.0 - 10.5 K/uL 16.0  18.3  15.9   Hemoglobin 12.0 - 15.0 g/dL 56.2  13.0  86.5   Hematocrit 36.0 - 46.0 % 42.2  42.8  39.1   Platelets 150 - 400 K/uL 560  692  573    . CBC    Component Value Date/Time   WBC 16.0 (H) 09/13/2023 1020   WBC 18.3 (H) 08/31/2023 0914   RBC 4.81 09/13/2023 1020   HGB 12.2 09/13/2023 1020   HCT 42.2  09/13/2023 1020   PLT 560 (H) 09/13/2023 1020   MCV 87.7 09/13/2023 1020   MCH 25.4 (L) 09/13/2023 1020   MCHC 28.9 (L) 09/13/2023 1020   RDW 19.2 (H) 09/13/2023 1020   LYMPHSABS 1.1 09/13/2023 1020   MONOABS 0.4 09/13/2023 1020   EOSABS 0.2 09/13/2023 1020   BASOSABS 0.2 (H) 09/13/2023 1020     .    Latest Ref Rng & Units 09/13/2023   10:20 AM 09/08/2023   12:49 PM 08/31/2023    9:14 AM  CMP  Glucose 70 - 99 mg/dL 161   096   BUN 8 - 23 mg/dL 31   26   Creatinine 0.45 - 1.00 mg/dL 4.09   8.11   Sodium 914 - 145 mmol/L 139   138   Potassium 3.5 - 5.1 mmol/L 4.4   4.0   Chloride 98 - 111 mmol/L 100   102   CO2 22 - 32 mmol/L 32   23   Calcium 8.9 - 10.3 mg/dL 9.3   9.6   Total Protein 6.5 - 8.1 g/dL 7.4  6.6    Total Bilirubin 0.0 - 1.2 mg/dL 0.3  <7.8    Alkaline Phos 38 - 126 U/L 90  97    AST 15 - 41 U/L 11  17    ALT 0 - 44 U/L 9  11     08/15/12 JAK2 Mutation:     03/15/18 BM Bx:      RADIOGRAPHIC STUDIES: I have  personally reviewed the radiological images as listed and agreed with the findings in the report. EP STUDY Result Date: 09/06/2023 See surgical note for result.   ASSESSMENT & PLAN:   63 y.o. female with  1. Jak2 V617F mutation positive Myeloproliferative syndrome.- BM Bx consistent with Polycythemia Vera  JAK2 mutation identified in 08/15/12 labs  03/15/18 BM Bx revealed results consistent with JAK2 positive polycythemia vera  Labs done today show hemoglobin of 11.8, WBC count of 21.2k and platelets of 1k  2. H/o Medication/followup Non compliance  3. COPD - O2 dependant. Still smoking 1/2ppd  4. History of Metastatic melanoma to the right lung  S/p resection by Dr. Tyrone Sage on 10/21/12, BRAF mutation not detected. -continue q26monthly skin screening her PCP/dermatologist -symptom directed radiologic scanning.   5.  Past Medical History:  Diagnosis Date   Agoraphobia    Anxiety    Arthritis    "all joints"   Chronic low back pain    Chronic respiratory failure with hypoxia ([name])    4L  via [place],  followed by pcp,   (09-16-2018  per pt only uses while at home and at night ,  portable oxygen is not working)   Clotting disorder ([name]) 2013   Polycythemia vera   Colon polyps    COPD (chronic obstructive pulmonary disease) ([name])    DDD (degenerative disc disease), lumbosacral    Dental caries    DM type 2 (diabetes mellitus, type 2) ([name])    followed by pcp   Fibromyalgia    GERD (gastroesophageal reflux disease)    occasional , no meds   Heart murmur 2020   History of basal cell carcinoma (BCC) excision    s/p  Moh's of face/ nose 12/ 2013   History of palpitations    per pt found to have occaional PVCs   History of UTI    HTN (hypertension)    Hyperlipidemia    Loose, teeth    Major depression  Metastatic melanoma ([name]) 10/18/2012   12 mm posterior right upper lobe pulmonary nodule, max SUV 3.0  s/p  right wedge resection 10-21-2012,  followed by oncologist-- dr Candise Che,   no recurrence   Mixed stress and urge urinary incontinence    Myeloproliferative neoplasm ([name])    Narcolepsy    Obesity    OSA (obstructive sleep apnea)    Oxygen deficiency    Oxygen dependent    4 L via [place],  per pt portable oxygen not working but does use while at home and at night   Panic attacks    Periodontitis    Peripheral neuropathy    feet   Polycythemia vera(238.4) hematology/ oncology-- dr Candise Che ([facility])   first dx 02014 due to smoker/copd--  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)  hx phlebotomies   Pulmonary nodules    bilateral small stable per CT 03-11-2018   Scoliosis    Sleep apnea    Smokers' cough ([name])    per pt productive a little in the morning's   Symptoms of upper respiratory infection (URI) 03/12/2018   Urinary (tract) obstruction    Wears glasses    PLAN: -Discussed lab results from today, 09/13/2023, in detail with the patient. CBC shows elevated but improved WBC of 16.0 K and elevated but improved platelets of 560 K. CMP stable.  -Hydroxyurea dose will be changed to: 3 tablets on Monday/Wednesday/Friday and 2 tablets all other days of the week.  -Patient does not require phlebotomy today.   FOLLOW UP: RTC with Dr Candise Che with labs and appointment for therapeutic phlebotomy in 3 months  The total time spent in the appointment was 20 minutes* .  All of the patient's questions were answered with apparent satisfaction. The patient knows to call the clinic with any problems, questions or concerns.   Wyvonnia Lora MD MS AAHIVMS [facility] Hematology/Oncology Physician [facility]  .*Total Encounter Time as defined by the Centers for Medicare and Medicaid Services includes, in addition to the face-to-face time of a patient visit (documented in the note above) non-face-to-face time: obtaining and reviewing outside history, ordering and reviewing medications, tests or procedures, care coordination (communications with  other health care professionals or caregivers) and documentation in the medical record.   I,Param Shah,acting as a Neurosurgeon for Wyvonnia Lora, MD.,have documented all relevant documentation on the behalf of Wyvonnia Lora, MD,as directed by  Wyvonnia Lora, MD while in the presence of Wyvonnia Lora, MD.  .I have reviewed the above documentation for accuracy and completeness, and I agree with the above. [name] MD

## 2023-09-14 ENCOUNTER — Ambulatory Visit (HOSPITAL_COMMUNITY)
Admission: RE | Admit: 2023-09-14 | Discharge: 2023-09-14 | Disposition: A | Discharge status: Home / Self Care | Payer: Medicare HMO | Source: Ambulatory Visit | Attending: Cardiovascular Disease | Admitting: Cardiovascular Disease

## 2023-09-14 DIAGNOSIS — I2510 Atherosclerotic heart disease of native coronary artery without angina pectoris: Secondary | ICD-10-CM | POA: Diagnosis not present

## 2023-09-14 DIAGNOSIS — J449 Chronic obstructive pulmonary disease, unspecified: Secondary | ICD-10-CM | POA: Diagnosis not present

## 2023-09-14 DIAGNOSIS — I358 Other nonrheumatic aortic valve disorders: Secondary | ICD-10-CM | POA: Insufficient documentation

## 2023-09-14 DIAGNOSIS — I272 Pulmonary hypertension, unspecified: Secondary | ICD-10-CM | POA: Insufficient documentation

## 2023-09-14 DIAGNOSIS — I700 Atherosclerosis of aorta: Secondary | ICD-10-CM | POA: Insufficient documentation

## 2023-09-14 DIAGNOSIS — R931 Abnormal findings on diagnostic imaging of heart and coronary circulation: Secondary | ICD-10-CM | POA: Diagnosis not present

## 2023-09-14 DIAGNOSIS — E785 Hyperlipidemia, unspecified: Secondary | ICD-10-CM | POA: Insufficient documentation

## 2023-09-14 LAB — NM PET CT CARDIAC PERFUSION MULTI W/ABSOLUTE BLOODFLOW
LV dias vol: 156 mL (ref 46–106)
LV sys vol: 81 mL
MBFR: 1.44
Nuc Rest EF: 48 %
Nuc Stress EF: 57 %
Rest MBF: 0.9 ml/g/min
Rest Nuclear Isotope Dose: 28.5 mCi
SDS: 5
ST Depression (mm): 0 mm
Stress MBF: 1.3 ml/g/min
Stress Nuclear Isotope Dose: 28.5 mCi

## 2023-09-14 MED ORDER — RUBIDIUM RB82 GENERATOR (RUBYFILL)
28.4900 | PACK | Freq: Once | INTRAVENOUS | Status: AC
  Administered 2023-09-14: 28.49 via INTRAVENOUS

## 2023-09-14 MED ORDER — REGADENOSON 0.4 MG/5ML IV SOLN
INTRAVENOUS | Status: AC
Start: 2023-09-14 — End: ?
  Filled 2023-09-14: qty 5

## 2023-09-14 MED ORDER — DEXTROSE 5 % IV SOLN
INTRAVENOUS | Status: AC
  Filled 2023-09-14: qty 50

## 2023-09-14 MED ORDER — CAFFEINE CITRATE BASE COMPONENT 10 MG/ML IV SOLN
INTRAVENOUS | Status: AC
  Filled 2023-09-14: qty 3

## 2023-09-14 MED ORDER — REGADENOSON 0.4 MG/5ML IV SOLN
0.4000 mg | Freq: Once | INTRAVENOUS | Status: AC
  Administered 2023-09-14: 0.4 mg via INTRAVENOUS

## 2023-09-14 NOTE — Progress Notes (Signed)
Tolerated Lexiscan well.

## 2023-09-19 ENCOUNTER — Encounter: Payer: Self-pay | Admitting: Hematology

## 2023-09-22 ENCOUNTER — Ambulatory Visit (HOSPITAL_BASED_OUTPATIENT_CLINIC_OR_DEPARTMENT_OTHER)
Admission: RE | Admit: 2023-09-22 | Discharge: 2023-09-22 | Disposition: A | Discharge status: Home / Self Care | Source: Ambulatory Visit | Attending: Cardiovascular Disease | Admitting: Cardiovascular Disease

## 2023-09-22 ENCOUNTER — Encounter: Payer: Self-pay | Admitting: Cardiovascular Disease

## 2023-09-22 ENCOUNTER — Ambulatory Visit (HOSPITAL_COMMUNITY)
Admission: RE | Admit: 2023-09-22 | Discharge: 2023-09-22 | Disposition: A | Discharge status: Home / Self Care | Source: Ambulatory Visit | Attending: Cardiovascular Disease | Admitting: Cardiovascular Disease

## 2023-09-22 ENCOUNTER — Ambulatory Visit (INDEPENDENT_AMBULATORY_CARE_PROVIDER_SITE_OTHER): Admitting: Cardiovascular Disease

## 2023-09-22 VITALS — BP 134/70 | HR 63 | Ht 68.0 in | Wt 204.2 lb

## 2023-09-22 DIAGNOSIS — R9439 Abnormal result of other cardiovascular function study: Secondary | ICD-10-CM | POA: Insufficient documentation

## 2023-09-22 DIAGNOSIS — I739 Peripheral vascular disease, unspecified: Secondary | ICD-10-CM | POA: Diagnosis not present

## 2023-09-22 LAB — VAS US ABI WITH/WO TBI
Left ABI: 0.59
Right ABI: 0.53

## 2023-09-22 MED ORDER — PREDNISONE 50 MG PO TABS: ORAL_TABLET | ORAL | 0 refills | Status: DC

## 2023-09-22 NOTE — Patient Instructions (Addendum)
 Medication Instructions:  Your physician recommends that you continue on your current medications as directed. Please refer to the Current Medication list given to you today.  *If you need a refill on your cardiac medications before your next appointment, please call your pharmacy*   Testing/Procedures: See below   Follow-Up: At [facility], you and your health needs are our priority.  As part of our continuing mission to provide you with exceptional heart care, we have created designated Provider Care Teams.  These Care Teams include your primary Cardiologist (physician) and Advanced Practice Providers (APPs -  Physician Assistants and Nurse Practitioners) who all work together to provide you with the care you need, when you need it.  We recommend signing up for the patient portal called "MyChart".  Sign up information is provided on this After Visit Summary.  MyChart is used to connect with patients for Virtual Visits (Telemedicine).  Patients are able to view lab/test results, encounter notes, upcoming appointments, etc.  Non-urgent messages can be sent to your provider as well.   To learn more about what you can do with MyChart, go to [email or web address].    Your next appointment:   2-3 week(s) after your procedure (4/4)  Provider:   Nanetta Batty, MD    Other Instructions       Cardiac/Peripheral Catheterization   You are scheduled for a Cardiac Catheterization on Friday, April 4 with Dr. Bryan Lemma.  1. Please arrive at the [facility] (Main Entrance A) at [facility]: [address] [place], [place] [address] at 8:30 AM (This time is 2 hour(s) before your procedure to ensure your preparation).   Free valet parking service is available. You will check in at ADMITTING. The support person will be asked to wait in the waiting room.  It is OK to have someone drop you off and come back when you are ready to be discharged.        Special note: Every  effort is made to have your procedure done on time. Please understand that emergencies sometimes delay scheduled procedures.  2. Diet: Do not eat solid foods after midnight.  You may have clear liquids until 5 AM the day of the procedure.  3. Labs: done on 3/17  4. Medication instructions in preparation for your procedure:   Contrast Allergy: Yes, Please take Prednisone 50mg  by mouth at: Thirteen hours prior to cath 9:00pm on Thursday Seven hours prior to cath 3:00am on Friday And prior to leaving home please take last dose of Prednisone 50mg  and Benadryl 50mg  by mouth.   Stop taking Eliquis (Apixiban) on Tuesday, April 1.  Stop taking, Lasix (Furosemide)  and HTCZ (Hydrochlorothiazide) Friday, April 4,   Do not take Diabetes Med Glucophage (Metformin) on the day of the procedure and HOLD 48 HOURS AFTER THE PROCEDURE.  On the morning of your procedure, take Aspirin 81 mg and any morning medicines NOT listed above.  You may use sips of water.  5. Plan to go home the same day, you will only stay overnight if medically necessary. 6. You MUST have a responsible adult to drive you home. 7. An adult MUST be with you the first 24 hours after you arrive home. 8. Bring a current list of your medications, and the last time and date medication taken. 9. Bring ID and current insurance cards. 10.Please wear clothes that are easy to get on and off and wear slip-on shoes.  Thank you for allowing [place] to care  for you!   -- [facility]    1st Floor: - Lobby - Registration  - Pharmacy  - Lab - Cafe  2nd Floor: - PV Lab - Diagnostic Testing (echo, CT, nuclear med)  3rd Floor: - Vacant  4th Floor: - TCTS (cardiothoracic surgery) - AFib Clinic - Structural Heart Clinic - Vascular Surgery  - Vascular Ultrasound  5th Floor: - HeartCare Cardiology (general and EP) - Clinical Pharmacy for coumadin, hypertension, lipid, weight-loss medications, and med  management appointments    Valet parking services will be available as well.

## 2023-09-22 NOTE — Progress Notes (Signed)
 Katelyn Cline returns today for follow-up of her outpatient tests.  She did have a 2D echo performed 07/19/2023 that revealed normal LV systolic function with moderate aortic stenosis and a valve area of 1 cm.  She had an elevated coronary calcium score performed 07/19/2023 which was 1238 distributed all 3 coronary arteries although she denies chest pain.  She is chronically short of breath on O2.  She did have a cardiac PET study performed 09/14/2023 which was read as high risk with decreased blood flow in all 3 coronary arteries.  She also had peripheral Dopplers today that showed severe SFA disease bilaterally.  I believe prior to addressing her lower extremities she will need to have a right left heart cath to define her anatomy and physiology.  I suspect she may need CABG/AVR.  I have discussed this with Dr. Herbie Baltimore who agrees to proceed as an outpatient.  I have reviewed the risks, indications, and alternatives to cardiac catheterization, possible angioplasty, and stenting with the patient. Risks include but are not limited to bleeding, infection, vascular injury, stroke, myocardial infection, arrhythmia, kidney injury, radiation-related injury in the case of prolonged fluoroscopy use, emergency cardiac surgery, and death. The patient understands the risks of serious complication is 1-2 in 1000 with diagnostic cardiac cath and 1-2% or less with angioplasty/stenting.    Runell Gess, M.D., FACP, [facility], [facility] [address] [place], [place]  [address]  [phone] 09/22/2023 10:49 AM

## 2023-09-28 ENCOUNTER — Other Ambulatory Visit: Payer: Self-pay | Admitting: Cardiovascular Disease

## 2023-09-28 DIAGNOSIS — I350 Nonrheumatic aortic (valve) stenosis: Secondary | ICD-10-CM

## 2023-09-28 DIAGNOSIS — R9439 Abnormal result of other cardiovascular function study: Secondary | ICD-10-CM

## 2023-09-29 ENCOUNTER — Telehealth: Payer: Self-pay | Admitting: *Deleted

## 2023-09-29 NOTE — Telephone Encounter (Signed)
 Cardiac Catheterization scheduled at [facility] for: Friday October 01, 2023 10:30 AM Arrival time [facility] Main Entrance A at: 8:30 AM  Nothing to eat after midnight prior to procedure, clear liquids until 5 AM day of procedure.  CONTRAST ALLERGY: yes-13 hour Prednisone and Benadryl Prep: 09/30/23 Prednisone 50 mg at 9:30 PM 10/01/23 Prednisone 50 mg 3:30 AM 10/01/23 Prednisone 50 mg and Benadryl 50 mg just prior to leaving home for hospital Patient advised not to drive after taking Benadryl.  Medication instructions: -Hold:  Lasix/KCl/hydrochlorothiazide-AM of procedure  Metformin-day of procedure and 48 hours post procedure -Other usual morning medications can be taken with sips of water including aspirin 81 mg.  Plan to go home the same day, you will only stay overnight if medically necessary.  You must have responsible adult to drive you home.  Someone must be with you the first 24 hours after you arrive home.  Reviewed procedure instructions with patient.

## 2023-10-01 ENCOUNTER — Ambulatory Visit (HOSPITAL_COMMUNITY)
Admission: RE | Admit: 2023-10-01 | Discharge: 2023-10-01 | Disposition: A | Discharge status: Home / Self Care | Attending: Cardiology | Admitting: Cardiology

## 2023-10-01 ENCOUNTER — Encounter (HOSPITAL_COMMUNITY): Admission: RE | Disposition: A | Discharge status: Home / Self Care | Source: Home / Self Care | Attending: Cardiology

## 2023-10-01 ENCOUNTER — Other Ambulatory Visit: Payer: Self-pay

## 2023-10-01 DIAGNOSIS — Z8249 Family history of ischemic heart disease and other diseases of the circulatory system: Secondary | ICD-10-CM | POA: Insufficient documentation

## 2023-10-01 DIAGNOSIS — Z7984 Long term (current) use of oral hypoglycemic drugs: Secondary | ICD-10-CM | POA: Diagnosis not present

## 2023-10-01 DIAGNOSIS — R9439 Abnormal result of other cardiovascular function study: Secondary | ICD-10-CM

## 2023-10-01 DIAGNOSIS — I4819 Other persistent atrial fibrillation: Secondary | ICD-10-CM | POA: Insufficient documentation

## 2023-10-01 DIAGNOSIS — I350 Nonrheumatic aortic (valve) stenosis: Secondary | ICD-10-CM | POA: Diagnosis not present

## 2023-10-01 DIAGNOSIS — E1151 Type 2 diabetes mellitus with diabetic peripheral angiopathy without gangrene: Secondary | ICD-10-CM | POA: Insufficient documentation

## 2023-10-01 DIAGNOSIS — I10 Essential (primary) hypertension: Secondary | ICD-10-CM | POA: Diagnosis not present

## 2023-10-01 DIAGNOSIS — E785 Hyperlipidemia, unspecified: Secondary | ICD-10-CM | POA: Insufficient documentation

## 2023-10-01 DIAGNOSIS — J449 Chronic obstructive pulmonary disease, unspecified: Secondary | ICD-10-CM | POA: Diagnosis not present

## 2023-10-01 DIAGNOSIS — Z79899 Other long term (current) drug therapy: Secondary | ICD-10-CM | POA: Insufficient documentation

## 2023-10-01 DIAGNOSIS — I2582 Chronic total occlusion of coronary artery: Secondary | ICD-10-CM | POA: Diagnosis not present

## 2023-10-01 DIAGNOSIS — I2510 Atherosclerotic heart disease of native coronary artery without angina pectoris: Secondary | ICD-10-CM | POA: Diagnosis not present

## 2023-10-01 DIAGNOSIS — F17210 Nicotine dependence, cigarettes, uncomplicated: Secondary | ICD-10-CM | POA: Insufficient documentation

## 2023-10-01 LAB — GLUCOSE, CAPILLARY
Glucose-Capillary: 196 mg/dL — ABNORMAL HIGH (ref 70–99)
Glucose-Capillary: 170 mg/dL — ABNORMAL HIGH (ref 70–99)

## 2023-10-01 SURGERY — RIGHT/LEFT HEART CATH AND CORONARY ANGIOGRAPHY
Anesthesia: LOCAL

## 2023-10-01 MED ORDER — LIDOCAINE HCL (PF) 1 % IJ SOLN
INTRAMUSCULAR | Status: AC
  Filled 2023-10-01: qty 30

## 2023-10-01 MED ORDER — MIDAZOLAM HCL 2 MG/2ML IJ SOLN
INTRAMUSCULAR | Status: AC
  Filled 2023-10-01: qty 2

## 2023-10-01 MED ORDER — MIDAZOLAM HCL 2 MG/2ML IJ SOLN
INTRAMUSCULAR | Status: DC | PRN
  Administered 2023-10-01 (×2): 1 mg via INTRAVENOUS

## 2023-10-01 MED ORDER — FENTANYL CITRATE (PF) 100 MCG/2ML IJ SOLN
INTRAMUSCULAR | Status: AC
  Filled 2023-10-01: qty 2

## 2023-10-01 MED ORDER — SODIUM CHLORIDE 0.9 % WEIGHT BASED INFUSION
1.0000 mL/kg/h | INTRAVENOUS | Status: DC
Start: 2023-10-01 — End: 2023-10-01

## 2023-10-01 MED ORDER — LIDOCAINE HCL (PF) 1 % IJ SOLN
INTRAMUSCULAR | Status: DC | PRN
  Administered 2023-10-01 (×2): 2 mL via INTRADERMAL

## 2023-10-01 MED ORDER — IOHEXOL 350 MG/ML SOLN
INTRAVENOUS | Status: DC | PRN
  Administered 2023-10-01: 140 mL

## 2023-10-01 MED ORDER — FENTANYL CITRATE (PF) 100 MCG/2ML IJ SOLN
INTRAMUSCULAR | Status: DC | PRN
  Administered 2023-10-01 (×2): 25 ug via INTRAVENOUS

## 2023-10-01 MED ORDER — VERAPAMIL HCL 2.5 MG/ML IV SOLN
INTRAVENOUS | Status: DC | PRN
  Administered 2023-10-01: 10 mL via INTRA_ARTERIAL

## 2023-10-01 MED ORDER — HEPARIN SODIUM (PORCINE) 1000 UNIT/ML IJ SOLN
INTRAMUSCULAR | Status: DC | PRN
  Administered 2023-10-01: 5000 [IU] via INTRAVENOUS

## 2023-10-01 MED ORDER — SODIUM CHLORIDE 0.9 % WEIGHT BASED INFUSION: 3.0000 mL/kg/h | INTRAVENOUS | Status: AC

## 2023-10-01 MED ORDER — NITROGLYCERIN 0.4 MG SL SUBL
SUBLINGUAL_TABLET | SUBLINGUAL | Status: AC
  Filled 2023-10-01: qty 1

## 2023-10-01 MED ORDER — HEPARIN SODIUM (PORCINE) 1000 UNIT/ML IJ SOLN
INTRAMUSCULAR | Status: AC
  Filled 2023-10-01: qty 10

## 2023-10-01 MED ORDER — VERAPAMIL HCL 2.5 MG/ML IV SOLN
INTRAVENOUS | Status: AC
  Filled 2023-10-01: qty 2

## 2023-10-01 MED ORDER — HEPARIN (PORCINE) IN NACL 1000-0.9 UT/500ML-% IV SOLN
INTRAVENOUS | Status: DC | PRN
  Administered 2023-10-01 (×2): 500 mL

## 2023-10-01 MED ORDER — NITROGLYCERIN 0.4 MG SL SUBL
SUBLINGUAL_TABLET | SUBLINGUAL | Status: DC | PRN
  Administered 2023-10-01: .4 mg via SUBLINGUAL

## 2023-10-01 SURGICAL SUPPLY — 13 items
CATH BALLN WEDGE 5F 110CM (CATHETERS) IMPLANT
CATH INFINITI 5FR JL4 (CATHETERS) IMPLANT
CATH INFINITI AMBI 5FR TG (CATHETERS) IMPLANT
CATH LAUNCHER 6FR EBU3.5 (CATHETERS) IMPLANT
DEVICE RAD COMP TR BAND LRG (VASCULAR PRODUCTS) IMPLANT
GLIDESHEATH SLEND SS 6F .021 (SHEATH) IMPLANT
GUIDEWIRE INQWIRE 1.5J.035X260 (WIRE) IMPLANT
INQWIRE 1.5J .035X260CM (WIRE) ×1 IMPLANT
PACK CARDIAC CATHETERIZATION (CUSTOM PROCEDURE TRAY) ×1 IMPLANT
SET ATX-X65L (MISCELLANEOUS) IMPLANT
SHEATH GLIDE SLENDER 4/5FR (SHEATH) IMPLANT
SHEATH PROBE COVER 6X72 (BAG) IMPLANT
WIRE EMERALD 3MM-J .025X260CM (WIRE) IMPLANT

## 2023-10-01 NOTE — Interval H&P Note (Signed)
 History and Physical Interval Note:  10/01/2023 12:59 PM  Katelyn Cline  has presented today for surgery, with the diagnosis of positive stress - aortic stenosis.  The various methods of treatment have been discussed with the patient and family. After consideration of risks, benefits and other options for treatment, the patient has consented to  Procedure(s): RIGHT/LEFT HEART CATH AND CORONARY ANGIOGRAPHY (N/A)  PERCUTANEOUS CORONARY INTERVENTION  as a surgical intervention.  The patient's history has been reviewed, patient examined, no change in status, stable for surgery.  I have reviewed the patient's chart and labs.  Questions were answered to the patient's satisfaction.    Cath Lab Visit (complete for each Cath Lab visit)  Clinical Evaluation Leading to the Procedure:   ACS: No.  Non-ACS:    Anginal Classification: CCS II  Anti-ischemic medical therapy: Minimal Therapy (1 class of medications)  Non-Invasive Test Results: High-risk stress test findings: cardiac mortality >3%/year  Prior CABG: No previous CABG     Bryan Lemma

## 2023-10-01 NOTE — Discharge Instructions (Signed)

## 2023-10-01 NOTE — Brief Op Note (Signed)
 BRIEF RIGHT AND LEFT HEART CATH NOTE  10/01/2023 2:17 PM  SURGEON:  Surgeons and Role:    * Marykay Lex, MD - Primary  PROCEDURE:  Procedure(s): RIGHT/LEFT HEART CATH AND CORONARY ANGIOGRAPHY (N/A)  PATIENT:  Katelyn Cline  63 y.o. female patient of Dr. Gery Pray referred for right left heart catheterization.  She is a long-term smoker with significant PAD.  Echocardiogram shows progression of aortic disease to moderate aortic stenosis and potentially more significant with low-flow.  She was recently valuated with a stress PET that was highly positive for ischemia but also findings significant for multivessel disease.  Read as high risk.  Prominent defect was in the mid to basal inferior and inferolateral wall that is partially reversible.  This consistent with infarcted peri-infarct ischemia.  There is also otherwise diffuse areas of concern. She was referred for a left heart catheterization with consideration of possible need for AVR/ CABG  PRE-OPERATIVE DIAGNOSIS:  positive stress - aortic stenosis  PROCEDURE PERFORMED: Time Out: Verified patient identification, verified procedure, site/side was marked, verified correct patient position, special equipment/implants available, medications/allergies/relevent history reviewed, required imaging and test results available. Performed.  Access:  RIGHT radial Artery: 6 Fr sheath -- Seldinger technique using glide sheath micropuncture Kit Direct ultrasound guidance used.  Permanent image obtained and placed on chart. 10 mL radial cocktail IA; 5000 units IV Heparin  * Right Brachial Vein: 5Fr  sheath inserted using Seldinger technique with the glide sheath micropuncture kit  Right Heart Catheterization: 5 Fr Theone Murdoch catheter advanced under fluoroscopy with balloon inflated to the RA, RV, then PCWP-PA for hemodynamic measurement => catheter was advanced over the 0.25 J-wire.  Simultaneous FA & PA blood gases checked for SaO2% to calculate  FICK CO/CI.  Catheter removed completely out of the body with balloon deflated.  Left Heart Catheterization: 5 and 6 Fr Catheters advanced or exchanged over a J-wire under direct fluoroscopic guidance into the ascending aorta; take 4.0 catheter advanced first.  *  LV Hemodynamics (LV Gram) &  Right Coronary Artery Cineangiography: : Take 4.0 catheter *  Left Coronary Artery Cineangiography: Initial attempts with TIG 4.0 were not successful, converted to JL 4 and then 6 [place] EBU 3.5 guide catheter  Upon completion of Angiogaphy, the catheter was removed completely out of the body over a wire, without complication.  Radial sheath removed in the Cardiac Catheterization lab with TR Band placed for hemostasis.  TR Band: 1410 Hours; 14 mL air  MEDICATIONS * SQ Lidocaine 3mL * Radial Cocktail: 3 mg Verapmil in 10 mL NS * Isovue Contrast: 140 mL * Heparin: 5000 units units  EBL:  < 50 mL  PATIENT DISPOSITION:  PACU - hemodynamically stable.  She did have some chest pain at the completion of the procedure was given additional and 1 dose of SL NTG with resolution of pain  POST-OPERATIVE DIAGNOSIS:  Mean AoV gradient 19 mmHg with systemic hypertension Multivessel disease: Proximal RCA on percent CTO with the distal portion filling via left-to-right collaterals mostly the PDA.  (Cannot exclude codominant RCA) Distal left main eccentric calcified 40 proximately 50% stenosis of the vessel bifurcates into the LAD and LCx LCx bifurcates quickly into the AV groove and large OM1:  OM1 has proximal 90% stenosis and is a large-caliber vessel Ostial AVG LCx has 80% stenosis and gives rise to small caliber LPL 1 and LPL 3 and large caliber LPL 2.  60% stenosis just proximal to LPL 1 and then  the LPL 2 continuation of the circumflex has 80% stenosis. LAD gives off a large caliber D1 that has a very proximal focal 90% stenosis before it bifurcates into 2 large branches.  There is diffuse disease throughout  the vessel.  The LAD after D1 has a focal 70% stenosis.  Then in the mid vessel there is a bend the segment that starts with 30% followed by 70% and then 60%.  PLAN OF CARE: Discharge to home after PACU; recommend outpatient CVTS consultation  DICTATION: .Note written in EPIC   Delay start of Pharmacological VTE agent (>24hrs) due to surgical blood loss or risk of bleeding: not applicable   Bryan Lemma, MD

## 2023-10-02 ENCOUNTER — Telehealth: Payer: Self-pay | Admitting: Student

## 2023-10-02 LAB — POCT I-STAT 7, (LYTES, BLD GAS, ICA,H+H)
Acid-base deficit: 1 mmol/L (ref 0.0–2.0)
Bicarbonate: 24.2 mmol/L (ref 20.0–28.0)
Calcium, Ion: 1.25 mmol/L (ref 1.15–1.40)
HCT: 39 % (ref 36.0–46.0)
Hemoglobin: 13.3 g/dL (ref 12.0–15.0)
O2 Saturation: 94 %
Potassium: 4 mmol/L (ref 3.5–5.1)
Sodium: 140 mmol/L (ref 135–145)
TCO2: 25 mmol/L (ref 22–32)
pCO2 arterial: 40.1 mmHg (ref 32–48)
pH, Arterial: 7.389 (ref 7.35–7.45)
pO2, Arterial: 73 mmHg — ABNORMAL LOW (ref 83–108)

## 2023-10-02 LAB — POCT I-STAT EG7
Acid-Base Excess: 0 mmol/L (ref 0.0–2.0)
Acid-Base Excess: 0 mmol/L (ref 0.0–2.0)
Bicarbonate: 25.8 mmol/L (ref 20.0–28.0)
Bicarbonate: 25.4 mmol/L (ref 20.0–28.0)
Calcium, Ion: 1.26 mmol/L (ref 1.15–1.40)
Calcium, Ion: 1.26 mmol/L (ref 1.15–1.40)
HCT: 39 % (ref 36.0–46.0)
HCT: 40 % (ref 36.0–46.0)
Hemoglobin: 13.3 g/dL (ref 12.0–15.0)
Hemoglobin: 13.6 g/dL (ref 12.0–15.0)
O2 Saturation: 66 %
O2 Saturation: 69 %
Potassium: 4 mmol/L (ref 3.5–5.1)
Potassium: 4 mmol/L (ref 3.5–5.1)
Sodium: 140 mmol/L (ref 135–145)
Sodium: 140 mmol/L (ref 135–145)
TCO2: 27 mmol/L (ref 22–32)
TCO2: 27 mmol/L (ref 22–32)
pCO2, Ven: 44.5 mmHg (ref 44–60)
pCO2, Ven: 44.5 mmHg (ref 44–60)
pH, Ven: 7.371 (ref 7.25–7.43)
pH, Ven: 7.365 (ref 7.25–7.43)
pO2, Ven: 35 mmHg (ref 32–45)
pO2, Ven: 38 mmHg (ref 32–45)

## 2023-10-02 MED ORDER — ISOSORBIDE MONONITRATE ER 30 MG PO TB24: 30.0000 mg | ORAL_TABLET | Freq: Every day | ORAL | 3 refills | Status: DC

## 2023-10-02 MED ORDER — NITROGLYCERIN 0.4 MG SL SUBL: 0.4000 mg | SUBLINGUAL_TABLET | SUBLINGUAL | 3 refills | Status: DC | PRN

## 2023-10-02 NOTE — Telephone Encounter (Signed)
 Heard back from Dr Herbie Baltimore who recommends Imdur 30 mg at bed time and as needed sublingual nitroglycerin. Called and spoke with the patient to relay these recommendations. Per her request, sent in to [facility] on [address]. She verbalized understanding of plan.

## 2023-10-02 NOTE — Telephone Encounter (Signed)
 Patient called the answering service regarding when to restart eliquis following right/left heart cath on 10/01/23. Patient also had a cardioversion on 09/06/23. Reported the cath sight is healing well and that she has no significant swelling or tingling in the hand. Do not see information outlined in report. Ronie Spies discussed with Dr Tenny Craw and recommended she to restart the eliquis today as long as no cath site complications. Patient also concerns that she did not get nitroglycerin when she was discharged. Unclear if this was discussed or intentionally deferred. Had some aortic stenosis on echo so will message Dr Herbie Baltimore for clarification on rx and to send in if needed.

## 2023-10-04 ENCOUNTER — Encounter (HOSPITAL_COMMUNITY): Payer: Self-pay | Admitting: Cardiology

## 2023-10-06 ENCOUNTER — Telehealth: Payer: Self-pay | Admitting: *Deleted

## 2023-10-06 ENCOUNTER — Other Ambulatory Visit: Payer: Self-pay | Admitting: Family Medicine

## 2023-10-06 ENCOUNTER — Other Ambulatory Visit: Payer: Self-pay | Admitting: Hematology

## 2023-10-06 ENCOUNTER — Telehealth: Payer: Self-pay | Admitting: Hematology

## 2023-10-06 DIAGNOSIS — F4310 Post-traumatic stress disorder, unspecified: Secondary | ICD-10-CM

## 2023-10-06 DIAGNOSIS — D471 Chronic myeloproliferative disease: Secondary | ICD-10-CM

## 2023-10-06 DIAGNOSIS — I10 Essential (primary) hypertension: Secondary | ICD-10-CM

## 2023-10-06 DIAGNOSIS — E782 Mixed hyperlipidemia: Secondary | ICD-10-CM

## 2023-10-06 DIAGNOSIS — D45 Polycythemia vera: Secondary | ICD-10-CM

## 2023-10-06 DIAGNOSIS — E1140 Type 2 diabetes mellitus with diabetic neuropathy, unspecified: Secondary | ICD-10-CM

## 2023-10-06 NOTE — Telephone Encounter (Signed)
 Needs appt

## 2023-10-06 NOTE — Telephone Encounter (Signed)
 Copied from CRM [id number]. Topic: General - Other >> Oct 06, 2023  3:37 PM Denese Killings wrote: Reason for CRM: Patient wants to make sure that Dr. Ruthine Dose has information of her having a Cardiac Catherization and cardiac abrasion done. Patient is requesting a callback to see what type of appointment she needs.

## 2023-10-06 NOTE — Telephone Encounter (Signed)
 Katelyn Cline is aware of all appointment details.

## 2023-10-07 ENCOUNTER — Encounter: Payer: Self-pay | Admitting: *Deleted

## 2023-10-07 NOTE — Telephone Encounter (Signed)
 Patient notified of message below.

## 2023-10-19 NOTE — Progress Notes (Deleted)
 [address]       [place] [address]             [phone]        [facility] Medical Record #[id number] Date of Birth: 08/04/1960  Referring: Christel Cousins, MD Primary Care: Christel Cousins, MD Primary Cardiologist:Jonathan Katheryne Pane, MD  Chief Complaint:   No chief complaint on file.   History of Present Illness:     Katelyn Cline 63 y.o. female presents for surgical evaluation of ***   Past Medical and Surgical History: Previous Chest Surgery: *** Previous Chest Radiation: *** Diabetes Mellitus: ***.  HbA1C *** Creatinine:  Lab Results  Component Value Date   CREATININE 0.79 09/13/2023   CREATININE 0.68 08/31/2023   CREATININE 0.38 (L) 07/16/2023     Past Medical History:  Diagnosis Date   Agoraphobia    Anxiety    Arthritis    "all joints"   Chronic low back pain    Chronic respiratory failure with hypoxia ([name])    4L  via [place],  followed by pcp,   (09-16-2018  per pt only uses while at home and at night ,  portable oxygen  is not working)   Clotting disorder ([name]) 2013   Polycythemia vera   Colon polyps    COPD (chronic obstructive pulmonary disease) ([name])    DDD (degenerative disc disease), lumbosacral    Dental caries    DM type 2 (diabetes mellitus, type 2) ([name])    followed by pcp   Fibromyalgia    GERD (gastroesophageal reflux disease)    occasional , no meds   Heart murmur 2020   History of basal cell carcinoma (BCC) excision    s/p  Moh's of face/ nose 12/ 2013   History of palpitations    per pt found to have occaional PVCs   History of UTI    HTN (hypertension)    Hyperlipidemia    Loose, teeth    Major depression    Metastatic melanoma ([name]) 10/18/2012   12 mm posterior right upper lobe pulmonary nodule, max SUV 3.0  s/p  right wedge resection 10-21-2012,  followed by oncologist-- dr Salomon Cree,  no recurrence   Mixed stress and urge urinary incontinence    Myeloproliferative neoplasm ([name])     Narcolepsy    Obesity    OSA (obstructive sleep apnea)    Oxygen  deficiency    Oxygen  dependent    4 L via [place],  per pt portable oxygen  not working but does use while at home and at night   Panic attacks    Periodontitis    Peripheral neuropathy    feet   Polycythemia vera(238.4) hematology/ oncology-- dr Salomon Cree ([facility])   first dx 02014 due to smoker/copd--  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)  hx phlebotomies   Pulmonary nodules    bilateral small stable per CT 03-11-2018   Scoliosis    Sleep apnea    Smokers' cough ([name])    per pt productive a little in the morning's   Symptoms of upper respiratory infection (URI) 03/12/2018   Urinary (tract) obstruction    Wears glasses     Past Surgical History:  Procedure Laterality Date   ABDOMINAL HYSTERECTOMY  1998   partial   BREAST LUMPECTOMY Bilateral 1995   benign per pt   CARDIOVERSION N/A 09/06/2023   Procedure: CARDIOVERSION;  Surgeon: Luana Rumple, MD;  Location: [facility] INVASIVE  CV LAB;  Service: Cardiovascular;  Laterality: N/A;   COLONOSCOPY W/ BIOPSIES AND POLYPECTOMY     Hx: of   CYSTECTOMY  2000   abdominal wall    DILATION AND CURETTAGE OF UTERUS  yrs ago   MOHS SURGERY  2013   nose/face   MULTIPLE EXTRACTIONS WITH ALVEOLOPLASTY N/A 09/19/2018   Procedure: Extraction of tooth #'s 2, 3, 6-14, 18, and 20-30 with alveoloplasty;  Surgeon: Kimbly Chroman, DDS;  Location: [facility] ORS;  Service: Oral Surgery;  Laterality: N/A;  GENERAL WITH NASAL TUBE   RIGHT/LEFT HEART CATH AND CORONARY ANGIOGRAPHY N/A 10/01/2023   Procedure: RIGHT/LEFT HEART CATH AND CORONARY ANGIOGRAPHY;  Surgeon: Arleen Lacer, MD;  Location: [facility] INVASIVE CV LAB;  Service: Cardiovascular;  Laterality: N/A;   VIDEO ASSISTED THORACOSCOPY (VATS)/WEDGE RESECTION Right 10/21/2012   Procedure: VIDEO ASSISTED THORACOSCOPY (VATS)/WEDGE RESECTION;  Surgeon: Norita Beauvais, MD;  Location: [facility] OR;  Service: Thoracic;  Laterality:  Right;   VIDEO BRONCHOSCOPY N/A 10/21/2012   Procedure: VIDEO BRONCHOSCOPY;  Surgeon: Norita Beauvais, MD;  Location: [facility] OR;  Service: Thoracic;  Laterality: N/A;    Social History:  Social History   Tobacco Use  Smoking Status Every Day   Current packs/day: 0.50   Average packs/day: 0.5 packs/day for 50.0 years (25.0 ttl pk-yrs)   Types: Cigarettes  Smokeless Tobacco Never  Tobacco Comments   Current smoker 10 cigarettes daily 06/10/23    Social History   Substance and Sexual Activity  Alcohol  Use Yes   Alcohol /week: 1.0 standard drink of alcohol    Types: 1 Standard drinks or equivalent per week   Comment: Not weekly     Allergies  Allergen Reactions   Contrast Media [Iodinated Contrast Media] Anaphylaxis    CAT Scan Conrast    Erythromycin     Stomach pain   Flagyl [Metronidazole] Other (See Comments)    Generalized pain, caused sickness   Latex Other (See Comments)    Was told to be careful because of Dye allergy   Other     Iodine Dye   Penicillins Other (See Comments)    Patient was an infant, no idea of reaction. Tolerates Keflex . Did it involve swelling of the face/tongue/throat, SOB, or low BP? Unknown Did it involve sudden or severe rash/hives, skin peeling, or any reaction on the inside of your mouth or nose? Unknown Did you need to seek medical attention at a hospital or doctor's office? Unknown When did it last happen?      infant If all above answers are "NO", may proceed with cephalosporin use. CHILDHOOD ALLERGY    Tetracyclines & Related Other (See Comments)    GI side effects    Medications: Asprin: *** Statin: *** Beta Blocker: *** Ace Inhibitor: *** Anti-Coagulation: ***  Current Outpatient Medications  Medication Sig Dispense Refill   acetaminophen  (TYLENOL ) 500 MG tablet Take 1,000 mg by mouth every 8 (eight) hours as needed for moderate pain (pain score 4-6).     albuterol  (VENTOLIN  HFA) 108 (90 Base) MCG/ACT inhaler Inhale 2 puffs  into the lungs every 6 (six) hours as needed for wheezing or shortness of breath (as needed). 17 each 3   aspirin  EC 81 MG tablet Take 1 tablet (81 mg total) by mouth daily. Swallow whole. 100 tablet 4   atorvastatin  (LIPITOR) 80 MG tablet Take 1 tablet by mouth daily at bedtime. 90 tablet 0   Blood Glucose Monitoring Suppl (BLOOD GLUCOSE SYSTEM PAK) KIT Please dispense based  on patient and insurance preference. Use as directed to monitor FSBS 2x daily. Dx: E11.9 1 kit 1   buPROPion  ER (WELLBUTRIN  SR) 100 MG 12 hr tablet Take 1 tablet by mouth every morning and take 1 tablet by mouth every day at bedtime. 180 tablet 0   diltiazem  (CARDIZEM  CD) 120 MG 24 hr capsule Take 1 capsule (120 mg total) by mouth daily. 90 capsule 0   DULoxetine  (CYMBALTA ) 60 MG capsule Take 1 capsule by mouth twice daily. 180 capsule 0   ELIQUIS  5 MG TABS tablet Take 1 tablet (5 mg total) by mouth 2 (two) times daily. 180 tablet 0   ezetimibe  (ZETIA ) 10 MG tablet Take 1 tablet by mouth every morning. 90 tablet 0   fenofibrate  (TRICOR ) 145 MG tablet Take 1 tablet by mouth daily at bedtime. 90 tablet 0   fish oil-omega-3 fatty acids  1000 MG capsule Take 2 capsules (2 g total) by mouth 2 (two) times daily. 180 capsule 1   furosemide  (LASIX ) 20 MG tablet Take 1 tablet (20 mg total) by mouth daily as needed. 30 tablet 3   gabapentin  (NEURONTIN ) 100 MG capsule Take 3 capsules by mouth three times daily as needed. (Patient not taking: Reported on 09/28/2023) 270 capsule 2   glucose blood (ACCU-CHEK GUIDE) test strip Use as instructed 100 each 12   Glucose Blood (BLOOD GLUCOSE TEST STRIPS) STRP Please dispense based on patient and insurance preference. Use as directed to monitor FSBS 2x daily. Dx: E11.9 100 strip 1   hydrochlorothiazide  (HYDRODIURIL ) 25 MG tablet Take 1 tablet by mouth every morning. 90 tablet 0   hydroxyurea  (HYDREA ) 500 MG capsule Take 2 capsules by mouth daily at bedtime with an additional 1 capsule on Monday and  Thursday. 90 capsule 2   isosorbide  mononitrate (IMDUR ) 30 MG 24 hr tablet Take 1 tablet (30 mg total) by mouth at bedtime. 30 tablet 3   lisinopril  (ZESTRIL ) 5 MG tablet Take 1 tablet by mouth daily at bedtime. 90 tablet 0   metFORMIN  (GLUCOPHAGE ) 1000 MG tablet Take 1 tablet by mouth every morning and take 1 tablet by mouth every day at bedtime. 180 tablet 0   Microlet Lancets MISC Please dispense based on patient and insurance preference. Use as directed to monitor FSBS 2x daily. Dx: E11.9 100 each 11   nitroGLYCERIN  (NITROSTAT ) 0.4 MG SL tablet Place 1 tablet (0.4 mg total) under the tongue every 5 (five) minutes as needed for chest pain (up to 3 doses. If taking 3rd dose call 911). 25 tablet 3   nystatin  (MYCOSTATIN /NYSTOP ) powder Apply 1 Application topically 3 (three) times daily. PA Case: [id number] Approved 06/30/2019- 06/28/2020 (Patient taking differently: Apply 1 Application topically 3 (three) times daily as needed (irritation). PA Case: [id number] Approved 06/30/2019- 06/28/2020) 60 g 2   nystatin  cream (MYCOSTATIN ) Apply topically twice daily. (Patient taking differently: Apply 1 Application topically 2 (two) times daily as needed for dry skin.) 60 g 1   oxyCODONE -acetaminophen  (PERCOCET) 10-325 MG tablet Take 1 tablet by mouth every 4 (four) hours as needed for severe pain (pain score 7-10).     potassium chloride  SA (KLOR-CON  M) 20 MEQ tablet Take 1 tablet (20 mEq total) by mouth daily as needed. Take if take furosemide  30 tablet 3   predniSONE  (DELTASONE ) 50 MG tablet Take 1 tablet 13 hours prior to procedure, 1 tablet 7 hours prior to procedure, and 1 tablet (with Benedryl 50 mg) prior to going to the hospital for procedure 3  tablet 0   senna (SENOKOT) 8.6 MG TABS tablet Take 2 tablets by mouth daily as needed for mild constipation.     Vitamin D , Ergocalciferol , (DRISDOL ) 1.25 MG (50000 UNIT) CAPS capsule Take 1 capsule by mouth on Sundays at bedtime. 12 capsule 0   No current  facility-administered medications for this visit.    (Not in a hospital admission)   Family History  Problem Relation Age of Onset   Arthritis Mother    Hyperlipidemia Mother    Depression Mother    Anxiety disorder Mother    Dementia Mother    Hypertension Father    Hyperlipidemia Father    Heart disease Father    Stroke Father    Dementia Father    Heart disease Brother    ADD / ADHD Son    Alcohol  abuse Maternal Grandfather    Bipolar disorder Neg Hx    Drug abuse Neg Hx    OCD Neg Hx    Paranoid behavior Neg Hx    Schizophrenia Neg Hx    Seizures Neg Hx    Sexual abuse Neg Hx    Physical abuse Neg Hx      Review of Systems:   ROS    Physical Exam: There were no vitals taken for this visit. Physical Exam    Diagnostic Studies & Laboratory data:    Left Heart Catherization: *** Echo: ***   EKG: *** I have independently reviewed the above radiologic studies and discussed with the patient   Recent Lab Findings: Lab Results  Component Value Date   WBC 16.0 (H) 09/13/2023   HGB 13.3 10/01/2023   HGB 13.6 10/01/2023   HCT 39.0 10/01/2023   HCT 40.0 10/01/2023   PLT 560 (H) 09/13/2023   GLUCOSE 215 (H) 09/13/2023   CHOL 115 09/08/2023   TRIG 108 09/08/2023   HDL 38 (L) 09/08/2023   LDLDIRECT 116.0 11/17/2022   LDLCALC 57 09/08/2023   ALT 9 09/13/2023   AST 11 (L) 09/13/2023   NA 140 10/01/2023   NA 140 10/01/2023   K 4.0 10/01/2023   K 4.0 10/01/2023   CL 100 09/13/2023   CREATININE 0.79 09/13/2023   BUN 31 (H) 09/13/2023   CO2 32 09/13/2023   TSH 2.51 11/17/2022   INR 1.00 03/12/2018   HGBA1C 5.8 11/17/2022      Assessment / Plan:   ***  AVR CABG   I  spent {CHL ONC TIME VISIT - ZOXWR:[id number]} counseling the patient face to face.   Hilarie Lovely 10/19/2023 2:19 PM

## 2023-10-20 ENCOUNTER — Encounter: Payer: Self-pay | Admitting: Cardiovascular Disease

## 2023-10-20 ENCOUNTER — Ambulatory Visit: Attending: Cardiovascular Disease | Admitting: Cardiovascular Disease

## 2023-10-20 VITALS — BP 156/60 | HR 73 | Ht 68.5 in | Wt 207.4 lb

## 2023-10-20 DIAGNOSIS — E782 Mixed hyperlipidemia: Secondary | ICD-10-CM | POA: Diagnosis not present

## 2023-10-20 DIAGNOSIS — I10 Essential (primary) hypertension: Secondary | ICD-10-CM

## 2023-10-20 DIAGNOSIS — F17200 Nicotine dependence, unspecified, uncomplicated: Secondary | ICD-10-CM | POA: Diagnosis not present

## 2023-10-20 DIAGNOSIS — I480 Paroxysmal atrial fibrillation: Secondary | ICD-10-CM

## 2023-10-20 DIAGNOSIS — I350 Nonrheumatic aortic (valve) stenosis: Secondary | ICD-10-CM

## 2023-10-20 DIAGNOSIS — I739 Peripheral vascular disease, unspecified: Secondary | ICD-10-CM

## 2023-10-20 DIAGNOSIS — R931 Abnormal findings on diagnostic imaging of heart and coronary circulation: Secondary | ICD-10-CM

## 2023-10-20 NOTE — Patient Instructions (Signed)
 Medication Instructions:  Your physician recommends that you continue on your current medications as directed. Please refer to the Current Medication list given to you today.  *If you need a refill on your cardiac medications before your next appointment, please call your pharmacy*  Follow-Up: At [facility], you and your health needs are our priority.  As part of our continuing mission to provide you with exceptional heart care, our providers are all part of one team.  This team includes your primary Cardiologist (physician) and Advanced Practice Providers or APPs (Physician Assistants and Nurse Practitioners) who all work together to provide you with the care you need, when you need it.  Your next appointment:   3 month(s)  Provider:   Lauro Portal, MD     We recommend signing up for the patient portal called "MyChart".  Sign up information is provided on this After Visit Summary.  MyChart is used to connect with patients for Virtual Visits (Telemedicine).  Patients are able to view lab/test results, encounter notes, upcoming appointments, etc.  Non-urgent messages can be sent to your provider as well.   To learn more about what you can do with MyChart, go to [email or web address].   Other Instructions       1st Floor: - Lobby - Registration  - Pharmacy  - Lab - Cafe  2nd Floor: - PV Lab - Diagnostic Testing (echo, CT, nuclear med)  3rd Floor: - Vacant  4th Floor: - TCTS (cardiothoracic surgery) - AFib Clinic - Structural Heart Clinic - Vascular Surgery  - Vascular Ultrasound  5th Floor: - HeartCare Cardiology (general and EP) - Clinical Pharmacy for coumadin, hypertension, lipid, weight-loss medications, and med management appointments    Valet parking services will be available as well.

## 2023-10-20 NOTE — Assessment & Plan Note (Addendum)
 Elevated coronary calcium  score of 1238 distributed through all 3 coronary arteries.  She had a subsequent cardiac PET study performed 09/14/2023 which was read as high risk.  Based on this I elected for her to undergo outpatient right and left heart cath by Dr. Addie Holstein which was performed 10/01/23 revealing three-vessel disease.  She was referred to Dr. Deloise Ferries on 10/29/2023 for consideration of CABG/AVR.

## 2023-10-20 NOTE — Assessment & Plan Note (Signed)
 History of ongoing tobacco abuse with the intent to stop.

## 2023-10-20 NOTE — Assessment & Plan Note (Signed)
 History of A-fib status post cardioversion maintaining sinus rhythm on Eliquis  oral anticoagulation.

## 2023-10-20 NOTE — Assessment & Plan Note (Signed)
 History of hyperlipidemia on high-dose statin therapy and Zetia  with lipid profile performed 09/08/2023 revealed a total cholesterol of 115, LDL 57 and HDL of 38.

## 2023-10-20 NOTE — Assessment & Plan Note (Signed)
 Bilateral lower extremity claudication with Dopplers performed 09/22/2023 revealing right ABI of 0.53 and a left 0.59 with bilateral SFA disease.  Given her coronary artery disease and valvular disease, we will wait for her to be revascularized prior to addressing her PAD.

## 2023-10-20 NOTE — Assessment & Plan Note (Signed)
 Moderate aortic stenosis by 2D echocardiogram.

## 2023-10-20 NOTE — Assessment & Plan Note (Signed)
 History of essential hypertension with blood pressure measured today 156/60.  She is on diltiazem , hydrochlorothiazide .

## 2023-10-20 NOTE — Progress Notes (Signed)
 10/20/2023 Katelyn Cline   [birth year]  [id number]  Primary Physician Christel Cousins, MD Primary Cardiologist: Avanell Leigh MD Bennye Bravo, [place]  HPI:  Katelyn Cline is a 63 y.o.    moderately overweight divorced Caucasian female mother of 1 son who was referred to me by Dr. Waldo Guitar, her PCP, for atrial fibrillation.  I last saw her in the office.  She is 09/08/2023.  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  She was disabled because of panic attacks and agoraphobia.  Her risk factors include greater than 100 pack years of tobacco abuse having smoked 2 packs a day since she was 63 years old, currently smoking 1/2 pack/day.  She has been on supplemental oxygen  24/7 for the last 12 years.  She has a diagnosis of COPD.  Her other risk factors include treated diabetes, hypertension and hyperlipidemia.  She is a strong family history for heart disease with a mother who had stent, father had bypass surgery and a brother who had a myocardial infarction.   She is apparently down in [place]  on vacation in [place] in September and was awakened with shortness of breath.  She was evaluated at [facility] although the specifics of the hospitalization are unclear.  She was told that she was in "heart failure" and initially they recommended cardiac catheterization but this was never performed.  She really denies chest pain.   She has undergone outpatient DC cardioversion by Dr. Alvis Ba on Monday successfully to sinus rhythm.  She had a coronary calcium  score performed 07/19/2023 which showed a total of 1238 with calcium  distributed in all 3 coronary arteries.  A 2D echo revealed normal LV systolic function, mild concentric LVH with moderate aortic stenosis and a valve area of 1 cm.   She had a cardiac PET study performed 09/14/2023 that was high risk.  This led to a outpatient diagnostic cardiac cath which I arrange for Dr. Addie Holstein to do via the  right radial approach that revealed three-vessel disease.  She scheduled to see Dr. Deloise Ferries in the office 10/29/2023 for consideration of CABG/AVR.  After that, we will circle back around to readdress her PAD.  We have talked extensively about smoking cessation as well.  Current Meds  Medication Sig   acetaminophen  (TYLENOL ) 500 MG tablet Take 1,000 mg by mouth every 8 (eight) hours as needed for moderate pain (pain score 4-6).   albuterol  (VENTOLIN  HFA) 108 (90 Base) MCG/ACT inhaler Inhale 2 puffs into the lungs every 6 (six) hours as needed for wheezing or shortness of breath (as needed).   aspirin  EC 81 MG tablet Take 1 tablet (81 mg total) by mouth daily. Swallow whole.   atorvastatin  (LIPITOR) 80 MG tablet Take 1 tablet by mouth daily at bedtime.   Blood Glucose Monitoring Suppl (BLOOD GLUCOSE SYSTEM PAK) KIT Please dispense based on patient and insurance preference. Use as directed to monitor FSBS 2x daily. Dx: E11.9   buPROPion  ER (WELLBUTRIN  SR) 100 MG 12 hr tablet Take 1 tablet by mouth every morning and take 1 tablet by mouth every day at bedtime.   diltiazem  (CARDIZEM  CD) 120 MG 24 hr capsule Take 1 capsule (120 mg total) by mouth daily.   DULoxetine  (CYMBALTA ) 60 MG capsule Take 1 capsule by mouth twice daily.   ELIQUIS  5 MG TABS tablet Take 1 tablet (5 mg total) by mouth 2 (two) times daily.  ezetimibe  (ZETIA ) 10 MG tablet Take 1 tablet by mouth every morning.   fenofibrate  (TRICOR ) 145 MG tablet Take 1 tablet by mouth daily at bedtime.   fish oil-omega-3 fatty acids  1000 MG capsule Take 2 capsules (2 g total) by mouth 2 (two) times daily.   furosemide  (LASIX ) 20 MG tablet Take 1 tablet (20 mg total) by mouth daily as needed.   glucose blood (ACCU-CHEK GUIDE) test strip Use as instructed   Glucose Blood (BLOOD GLUCOSE TEST STRIPS) STRP Please dispense based on patient and insurance preference. Use as directed to monitor FSBS 2x daily. Dx: E11.9   hydrochlorothiazide  (HYDRODIURIL ) 25  MG tablet Take 1 tablet by mouth every morning.   hydroxyurea  (HYDREA ) 500 MG capsule Take 2 capsules by mouth daily at bedtime with an additional 1 capsule on Monday and Thursday.   isosorbide  mononitrate (IMDUR ) 30 MG 24 hr tablet Take 1 tablet (30 mg total) by mouth at bedtime.   lisinopril  (ZESTRIL ) 5 MG tablet Take 1 tablet by mouth daily at bedtime.   metFORMIN  (GLUCOPHAGE ) 1000 MG tablet Take 1 tablet by mouth every morning and take 1 tablet by mouth every day at bedtime.   Microlet Lancets MISC Please dispense based on patient and insurance preference. Use as directed to monitor FSBS 2x daily. Dx: E11.9   nitroGLYCERIN  (NITROSTAT ) 0.4 MG SL tablet Place 1 tablet (0.4 mg total) under the tongue every 5 (five) minutes as needed for chest pain (up to 3 doses. If taking 3rd dose call 911).   nystatin  (MYCOSTATIN /NYSTOP ) powder Apply 1 Application topically 3 (three) times daily. PA Case: [id number] Approved 06/30/2019- 06/28/2020 (Patient taking differently: Apply 1 Application topically 3 (three) times daily as needed (irritation). PA Case: [id number] Approved 06/30/2019- 06/28/2020)   nystatin  cream (MYCOSTATIN ) Apply topically twice daily. (Patient taking differently: Apply 1 Application topically 2 (two) times daily as needed for dry skin.)   oxyCODONE -acetaminophen  (PERCOCET) 10-325 MG tablet Take 1 tablet by mouth every 4 (four) hours as needed for severe pain (pain score 7-10).   potassium chloride  SA (KLOR-CON  M) 20 MEQ tablet Take 1 tablet (20 mEq total) by mouth daily as needed. Take if take furosemide    senna (SENOKOT) 8.6 MG TABS tablet Take 2 tablets by mouth daily as needed for mild constipation.   Vitamin D , Ergocalciferol , (DRISDOL ) 1.25 MG (50000 UNIT) CAPS capsule Take 1 capsule by mouth on Sundays at bedtime.     Allergies  Allergen Reactions   Contrast Media [Iodinated Contrast Media] Anaphylaxis    CAT Scan Conrast    Erythromycin     Stomach pain   Flagyl [Metronidazole] Other  (See Comments)    Generalized pain, caused sickness   Latex Other (See Comments)    Was told to be careful because of Dye allergy   Other     Iodine Dye   Penicillins Other (See Comments)    Patient was an infant, no idea of reaction. Tolerates Keflex . Did it involve swelling of the face/tongue/throat, SOB, or low BP? Unknown Did it involve sudden or severe rash/hives, skin peeling, or any reaction on the inside of your mouth or nose? Unknown Did you need to seek medical attention at a hospital or doctor's office? Unknown When did it last happen?      infant If all above answers are "NO", may proceed with cephalosporin use. CHILDHOOD ALLERGY    Tetracyclines & Related Other (See Comments)    GI side effects    Social History  Socioeconomic History   Marital status: Divorced    Spouse name: Not on file   Number of children: 1   Years of education: 12+   Highest education level: Not on file  Occupational History   Occupation: disabled  Tobacco Use   Smoking status: Every Day    Current packs/day: 0.50    Average packs/day: 0.5 packs/day for 50.0 years (25.0 ttl pk-yrs)    Types: Cigarettes   Smokeless tobacco: Never   Tobacco comments:    Current smoker 10 cigarettes daily 06/10/23  Vaping Use   Vaping status: Former   Quit date: 09/15/2016  Substance and Sexual Activity   Alcohol  use: Yes    Alcohol /week: 1.0 standard drink of alcohol     Types: 1 Standard drinks or equivalent per week    Comment: Not weekly   Drug use: Never   Sexual activity: Not Currently    Birth control/protection: Surgical  Other Topics Concern   Not on file  Social History Narrative   Son-lives w/son.   disabled   Social Drivers of Corporate investment banker Strain: Low Risk  (12/24/2022)   Overall Financial Resource Strain (CARDIA)    Difficulty of Paying Living Expenses: Not hard at all  Food Insecurity: No Food Insecurity (12/24/2022)   Hunger Vital Sign    Worried About Running Out  of Food in the Last Year: Never true    Ran Out of Food in the Last Year: Never true  Transportation Needs: No Transportation Needs (12/24/2022)   PRAPARE - Administrator, Civil Service (Medical): No    Lack of Transportation (Non-Medical): No  Physical Activity: Inactive (12/24/2022)   Exercise Vital Sign    Days of Exercise per Week: 0 days    Minutes of Exercise per Session: 0 min  Stress: Stress Concern Present (12/24/2022)   [employer] of Occupational Health - Occupational Stress Questionnaire    Feeling of Stress : Rather much  Social Connections: Socially Isolated (12/24/2022)   Social Connection and Isolation Panel [NHANES]    Frequency of Communication with Friends and Family: Once a week    Frequency of Social Gatherings with Friends and Family: Once a week    Attends Religious Services: Never    Database administrator or Organizations: No    Attends Banker Meetings: Never    Marital Status: Divorced  Catering manager Violence: Not At Risk (12/24/2022)   Humiliation, Afraid, Rape, and Kick questionnaire    Fear of Current or Ex-Partner: No    Emotionally Abused: No    Physically Abused: No    Sexually Abused: No     Review of Systems: General: negative for chills, fever, night sweats or weight changes.  Cardiovascular: negative for chest pain, dyspnea on exertion, edema, orthopnea, palpitations, paroxysmal nocturnal dyspnea or shortness of breath Dermatological: negative for rash Respiratory: negative for cough or wheezing Urologic: negative for hematuria Abdominal: negative for nausea, vomiting, diarrhea, bright red blood per rectum, melena, or hematemesis Neurologic: negative for visual changes, syncope, or dizziness All other systems reviewed and are otherwise negative except as noted above.    Blood pressure (!) 156/60, pulse 73, height 5' 8.5" (1.74 m), weight 207 lb 6.4 oz (94.1 kg), SpO2 94%.  General appearance: alert and no  distress Neck: no adenopathy, no carotid bruit, no JVD, supple, symmetrical, trachea midline, and thyroid  not enlarged, symmetric, no tenderness/mass/nodules Lungs: clear to auscultation bilaterally Heart: regular rate and rhythm, S1, S2  normal, no murmur, click, rub or gallop Extremities: extremities normal, atraumatic, no cyanosis or edema Pulses: Absent pedal pulses Skin: Skin color, texture, turgor normal. No rashes or lesions Neurologic: Grossly normal  EKG not performed today      ASSESSMENT AND PLAN:   Hyperlipidemia History of hyperlipidemia on high-dose statin therapy and Zetia  with lipid profile performed 09/08/2023 revealed a total cholesterol of 115, LDL 57 and HDL of 38.  Tobacco use disorder History of ongoing tobacco abuse with the intent to stop.  Essential hypertension, benign History of essential hypertension with blood pressure measured today 156/60.  She is on diltiazem , hydrochlorothiazide .  Atrial fibrillation ([name]) History of A-fib status post cardioversion maintaining sinus rhythm on Eliquis  oral anticoagulation.  Elevated coronary artery calcium  score Elevated coronary calcium  score of 1238 distributed through all 3 coronary arteries.  She had a subsequent cardiac PET study performed 09/14/2023 which was read as high risk.  Based on this I elected for her to undergo outpatient right and left heart cath by Dr. Addie Holstein which was performed 10/01/23 revealing three-vessel disease.  She was referred to Dr. Deloise Ferries on 10/29/2023 for consideration of CABG/AVR.  Aortic stenosis Moderate aortic stenosis by 2D echocardiogram.  Claudication in peripheral vascular disease ([name]) Bilateral lower extremity claudication with Dopplers performed 09/22/2023 revealing right ABI of 0.53 and a left 0.59 with bilateral SFA disease.  Given her coronary artery disease and valvular disease, we will wait for her to be revascularized prior to addressing her PAD.     Avanell Leigh MD  FACP,FACC,FAHA, [facility] 10/20/2023 11:11 AM

## 2023-10-25 ENCOUNTER — Emergency Department (HOSPITAL_COMMUNITY)

## 2023-10-25 ENCOUNTER — Encounter (HOSPITAL_COMMUNITY): Payer: Self-pay | Admitting: *Deleted

## 2023-10-25 ENCOUNTER — Other Ambulatory Visit: Payer: Self-pay

## 2023-10-25 ENCOUNTER — Inpatient Hospital Stay (HOSPITAL_COMMUNITY)
Admission: EM | Admit: 2023-10-25 | Discharge: 2023-11-25 | DRG: 219 | Disposition: A | Discharge status: Home Health | Attending: Thoracic Surgery (Cardiothoracic Vascular Surgery) | Admitting: Thoracic Surgery (Cardiothoracic Vascular Surgery)

## 2023-10-25 DIAGNOSIS — Q250 Patent ductus arteriosus: Secondary | ICD-10-CM | POA: Diagnosis not present

## 2023-10-25 DIAGNOSIS — J449 Chronic obstructive pulmonary disease, unspecified: Secondary | ICD-10-CM | POA: Diagnosis present

## 2023-10-25 DIAGNOSIS — Z87892 Personal history of anaphylaxis: Secondary | ICD-10-CM

## 2023-10-25 DIAGNOSIS — I350 Nonrheumatic aortic (valve) stenosis: Secondary | ICD-10-CM | POA: Diagnosis present

## 2023-10-25 DIAGNOSIS — I70213 Atherosclerosis of native arteries of extremities with intermittent claudication, bilateral legs: Secondary | ICD-10-CM | POA: Diagnosis present

## 2023-10-25 DIAGNOSIS — D649 Anemia, unspecified: Secondary | ICD-10-CM

## 2023-10-25 DIAGNOSIS — J9811 Atelectasis: Secondary | ICD-10-CM | POA: Diagnosis not present

## 2023-10-25 DIAGNOSIS — D45 Polycythemia vera: Secondary | ICD-10-CM | POA: Diagnosis not present

## 2023-10-25 DIAGNOSIS — Z9981 Dependence on supplemental oxygen: Secondary | ICD-10-CM | POA: Diagnosis not present

## 2023-10-25 DIAGNOSIS — Z952 Presence of prosthetic heart valve: Secondary | ICD-10-CM | POA: Diagnosis not present

## 2023-10-25 DIAGNOSIS — E7220 Disorder of urea cycle metabolism, unspecified: Secondary | ICD-10-CM | POA: Diagnosis not present

## 2023-10-25 DIAGNOSIS — E785 Hyperlipidemia, unspecified: Secondary | ICD-10-CM | POA: Diagnosis not present

## 2023-10-25 DIAGNOSIS — R195 Other fecal abnormalities: Secondary | ICD-10-CM | POA: Diagnosis present

## 2023-10-25 DIAGNOSIS — E1165 Type 2 diabetes mellitus with hyperglycemia: Secondary | ICD-10-CM | POA: Diagnosis not present

## 2023-10-25 DIAGNOSIS — I10 Essential (primary) hypertension: Secondary | ICD-10-CM | POA: Diagnosis not present

## 2023-10-25 DIAGNOSIS — Z7982 Long term (current) use of aspirin: Secondary | ICD-10-CM

## 2023-10-25 DIAGNOSIS — E1142 Type 2 diabetes mellitus with diabetic polyneuropathy: Secondary | ICD-10-CM | POA: Diagnosis present

## 2023-10-25 DIAGNOSIS — Z823 Family history of stroke: Secondary | ICD-10-CM

## 2023-10-25 DIAGNOSIS — D509 Iron deficiency anemia, unspecified: Secondary | ICD-10-CM

## 2023-10-25 DIAGNOSIS — I110 Hypertensive heart disease with heart failure: Secondary | ICD-10-CM | POA: Diagnosis present

## 2023-10-25 DIAGNOSIS — Z720 Tobacco use: Secondary | ICD-10-CM | POA: Diagnosis not present

## 2023-10-25 DIAGNOSIS — I5033 Acute on chronic diastolic (congestive) heart failure: Secondary | ICD-10-CM | POA: Diagnosis not present

## 2023-10-25 DIAGNOSIS — F32A Depression, unspecified: Secondary | ICD-10-CM | POA: Diagnosis present

## 2023-10-25 DIAGNOSIS — I447 Left bundle-branch block, unspecified: Secondary | ICD-10-CM | POA: Diagnosis present

## 2023-10-25 DIAGNOSIS — Z91040 Latex allergy status: Secondary | ICD-10-CM

## 2023-10-25 DIAGNOSIS — F4001 Agoraphobia with panic disorder: Secondary | ICD-10-CM | POA: Diagnosis present

## 2023-10-25 DIAGNOSIS — Z85828 Personal history of other malignant neoplasm of skin: Secondary | ICD-10-CM

## 2023-10-25 DIAGNOSIS — R571 Hypovolemic shock: Secondary | ICD-10-CM | POA: Diagnosis not present

## 2023-10-25 DIAGNOSIS — Z8249 Family history of ischemic heart disease and other diseases of the circulatory system: Secondary | ICD-10-CM

## 2023-10-25 DIAGNOSIS — Z951 Presence of aortocoronary bypass graft: Principal | ICD-10-CM

## 2023-10-25 DIAGNOSIS — F17210 Nicotine dependence, cigarettes, uncomplicated: Secondary | ICD-10-CM | POA: Diagnosis not present

## 2023-10-25 DIAGNOSIS — G9341 Metabolic encephalopathy: Secondary | ICD-10-CM | POA: Diagnosis not present

## 2023-10-25 DIAGNOSIS — K922 Gastrointestinal hemorrhage, unspecified: Secondary | ICD-10-CM | POA: Diagnosis present

## 2023-10-25 DIAGNOSIS — J42 Unspecified chronic bronchitis: Secondary | ICD-10-CM

## 2023-10-25 DIAGNOSIS — Z902 Acquired absence of lung [part of]: Secondary | ICD-10-CM

## 2023-10-25 DIAGNOSIS — I739 Peripheral vascular disease, unspecified: Secondary | ICD-10-CM

## 2023-10-25 DIAGNOSIS — E66811 Obesity, class 1: Secondary | ICD-10-CM | POA: Diagnosis present

## 2023-10-25 DIAGNOSIS — E11649 Type 2 diabetes mellitus with hypoglycemia without coma: Secondary | ICD-10-CM | POA: Diagnosis not present

## 2023-10-25 DIAGNOSIS — Z79899 Other long term (current) drug therapy: Secondary | ICD-10-CM

## 2023-10-25 DIAGNOSIS — I480 Paroxysmal atrial fibrillation: Secondary | ICD-10-CM | POA: Diagnosis present

## 2023-10-25 DIAGNOSIS — D72829 Elevated white blood cell count, unspecified: Secondary | ICD-10-CM | POA: Diagnosis not present

## 2023-10-25 DIAGNOSIS — G894 Chronic pain syndrome: Secondary | ICD-10-CM | POA: Diagnosis present

## 2023-10-25 DIAGNOSIS — Z6831 Body mass index (BMI) 31.0-31.9, adult: Secondary | ICD-10-CM

## 2023-10-25 DIAGNOSIS — D684 Acquired coagulation factor deficiency: Secondary | ICD-10-CM | POA: Diagnosis not present

## 2023-10-25 DIAGNOSIS — J918 Pleural effusion in other conditions classified elsewhere: Secondary | ICD-10-CM | POA: Diagnosis not present

## 2023-10-25 DIAGNOSIS — J942 Hemothorax: Secondary | ICD-10-CM | POA: Diagnosis not present

## 2023-10-25 DIAGNOSIS — G4733 Obstructive sleep apnea (adult) (pediatric): Secondary | ICD-10-CM | POA: Diagnosis present

## 2023-10-25 DIAGNOSIS — J95831 Postprocedural hemorrhage and hematoma of a respiratory system organ or structure following other procedure: Secondary | ICD-10-CM | POA: Diagnosis not present

## 2023-10-25 DIAGNOSIS — E870 Hyperosmolality and hypernatremia: Secondary | ICD-10-CM | POA: Diagnosis not present

## 2023-10-25 DIAGNOSIS — D62 Acute posthemorrhagic anemia: Secondary | ICD-10-CM | POA: Diagnosis not present

## 2023-10-25 DIAGNOSIS — Z7901 Long term (current) use of anticoagulants: Secondary | ICD-10-CM

## 2023-10-25 DIAGNOSIS — Z85820 Personal history of malignant melanoma of skin: Secondary | ICD-10-CM

## 2023-10-25 DIAGNOSIS — Z87440 Personal history of urinary (tract) infections: Secondary | ICD-10-CM

## 2023-10-25 DIAGNOSIS — E876 Hypokalemia: Secondary | ICD-10-CM | POA: Diagnosis not present

## 2023-10-25 DIAGNOSIS — Z881 Allergy status to other antibiotic agents status: Secondary | ICD-10-CM

## 2023-10-25 DIAGNOSIS — J9612 Chronic respiratory failure with hypercapnia: Secondary | ICD-10-CM | POA: Diagnosis present

## 2023-10-25 DIAGNOSIS — E872 Acidosis, unspecified: Secondary | ICD-10-CM | POA: Diagnosis not present

## 2023-10-25 DIAGNOSIS — Z716 Tobacco abuse counseling: Secondary | ICD-10-CM

## 2023-10-25 DIAGNOSIS — E1151 Type 2 diabetes mellitus with diabetic peripheral angiopathy without gangrene: Secondary | ICD-10-CM | POA: Diagnosis present

## 2023-10-25 DIAGNOSIS — J9601 Acute respiratory failure with hypoxia: Secondary | ICD-10-CM | POA: Diagnosis not present

## 2023-10-25 DIAGNOSIS — J9621 Acute and chronic respiratory failure with hypoxia: Secondary | ICD-10-CM | POA: Diagnosis not present

## 2023-10-25 DIAGNOSIS — I200 Unstable angina: Secondary | ICD-10-CM | POA: Diagnosis not present

## 2023-10-25 DIAGNOSIS — Z91041 Radiographic dye allergy status: Secondary | ICD-10-CM

## 2023-10-25 DIAGNOSIS — Z01810 Encounter for preprocedural cardiovascular examination: Secondary | ICD-10-CM | POA: Diagnosis not present

## 2023-10-25 DIAGNOSIS — Z7984 Long term (current) use of oral hypoglycemic drugs: Secondary | ICD-10-CM

## 2023-10-25 DIAGNOSIS — J9611 Chronic respiratory failure with hypoxia: Secondary | ICD-10-CM

## 2023-10-25 DIAGNOSIS — Z953 Presence of xenogenic heart valve: Secondary | ICD-10-CM

## 2023-10-25 DIAGNOSIS — Z880 Allergy status to penicillin: Secondary | ICD-10-CM

## 2023-10-25 DIAGNOSIS — I25110 Atherosclerotic heart disease of native coronary artery with unstable angina pectoris: Principal | ICD-10-CM

## 2023-10-25 DIAGNOSIS — M797 Fibromyalgia: Secondary | ICD-10-CM | POA: Diagnosis present

## 2023-10-25 DIAGNOSIS — I4891 Unspecified atrial fibrillation: Secondary | ICD-10-CM | POA: Diagnosis not present

## 2023-10-25 DIAGNOSIS — Z7964 Long term (current) use of myelosuppressive agent: Secondary | ICD-10-CM

## 2023-10-25 DIAGNOSIS — I9789 Other postprocedural complications and disorders of the circulatory system, not elsewhere classified: Secondary | ICD-10-CM | POA: Diagnosis not present

## 2023-10-25 DIAGNOSIS — I2510 Atherosclerotic heart disease of native coronary artery without angina pectoris: Secondary | ICD-10-CM | POA: Diagnosis not present

## 2023-10-25 DIAGNOSIS — Z90711 Acquired absence of uterus with remaining cervical stump: Secondary | ICD-10-CM

## 2023-10-25 DIAGNOSIS — D75839 Thrombocytosis, unspecified: Secondary | ICD-10-CM | POA: Diagnosis not present

## 2023-10-25 DIAGNOSIS — E119 Type 2 diabetes mellitus without complications: Secondary | ICD-10-CM | POA: Diagnosis not present

## 2023-10-25 DIAGNOSIS — M5137 Other intervertebral disc degeneration, lumbosacral region with discogenic back pain only: Secondary | ICD-10-CM | POA: Diagnosis present

## 2023-10-25 DIAGNOSIS — D500 Iron deficiency anemia secondary to blood loss (chronic): Secondary | ICD-10-CM | POA: Diagnosis not present

## 2023-10-25 DIAGNOSIS — K5909 Other constipation: Secondary | ICD-10-CM | POA: Diagnosis present

## 2023-10-25 DIAGNOSIS — R5381 Other malaise: Secondary | ICD-10-CM | POA: Diagnosis not present

## 2023-10-25 LAB — BASIC METABOLIC PANEL WITH GFR
Anion gap: 10 (ref 5–15)
BUN: 12 mg/dL (ref 8–23)
CO2: 26 mmol/L (ref 22–32)
Calcium: 9 mg/dL (ref 8.9–10.3)
Chloride: 103 mmol/L (ref 98–111)
Creatinine, Ser: 0.53 mg/dL (ref 0.44–1.00)
GFR, Estimated: >60 mL/min (ref >60)
Glucose, Bld: 145 mg/dL — ABNORMAL HIGH (ref 70–99)
Potassium: 3.5 mmol/L (ref 3.5–5.1)
Sodium: 139 mmol/L (ref 135–145)

## 2023-10-25 LAB — CBC
HCT: 29.6 % — ABNORMAL LOW (ref 36.0–46.0)
HCT: 31.4 % — ABNORMAL LOW (ref 36.0–46.0)
Hemoglobin: 8.3 g/dL — ABNORMAL LOW (ref 12.0–15.0)
Hemoglobin: 8.9 g/dL — ABNORMAL LOW (ref 12.0–15.0)
MCH: 25.8 pg — ABNORMAL LOW (ref 26.0–34.0)
MCH: 26 pg (ref 26.0–34.0)
MCHC: 28 g/dL — ABNORMAL LOW (ref 30.0–36.0)
MCHC: 28.3 g/dL — ABNORMAL LOW (ref 30.0–36.0)
MCV: 91.8 fL (ref 80.0–100.0)
MCV: 91.9 fL (ref 80.0–100.0)
Platelets: 283 10*3/uL (ref 150–400)
Platelets: 338 10*3/uL (ref 150–400)
RBC: 3.22 MIL/uL — ABNORMAL LOW (ref 3.87–5.11)
RBC: 3.42 MIL/uL — ABNORMAL LOW (ref 3.87–5.11)
RDW: 19.7 % — ABNORMAL HIGH (ref 11.5–15.5)
RDW: 19.9 % — ABNORMAL HIGH (ref 11.5–15.5)
WBC: 11.3 10*3/uL — ABNORMAL HIGH (ref 4.0–10.5)
WBC: 13.2 10*3/uL — ABNORMAL HIGH (ref 4.0–10.5)
nRBC: 0.3 % — ABNORMAL HIGH (ref 0.0–0.2)
nRBC: 0.4 % — ABNORMAL HIGH (ref 0.0–0.2)

## 2023-10-25 LAB — IRON AND TIBC
Iron: 35 ug/dL (ref 28–170)
Saturation Ratios: 7 % — ABNORMAL LOW (ref 10.4–31.8)
TIBC: 518 ug/dL — ABNORMAL HIGH (ref 250–450)
UIBC: 483 ug/dL

## 2023-10-25 LAB — GLUCOSE, CAPILLARY
Glucose-Capillary: 136 mg/dL — ABNORMAL HIGH (ref 70–99)
Glucose-Capillary: 155 mg/dL — ABNORMAL HIGH (ref 70–99)

## 2023-10-25 LAB — POC OCCULT BLOOD, ED: Fecal Occult Bld: POSITIVE — AB

## 2023-10-25 LAB — TYPE AND SCREEN
ABO/RH(D): O NEG
Antibody Screen: NEGATIVE

## 2023-10-25 LAB — TROPONIN I (HIGH SENSITIVITY)
Troponin I (High Sensitivity): 34 ng/L — ABNORMAL HIGH (ref <18)
Troponin I (High Sensitivity): 45 ng/L — ABNORMAL HIGH (ref <18)

## 2023-10-25 LAB — FERRITIN: Ferritin: 13 ng/mL (ref 11–307)

## 2023-10-25 LAB — CBG MONITORING, ED
Glucose-Capillary: 131 mg/dL — ABNORMAL HIGH (ref 70–99)
Glucose-Capillary: 130 mg/dL — ABNORMAL HIGH (ref 70–99)

## 2023-10-25 LAB — TRANSFERRIN: Transferrin: 361 mg/dL (ref 192–382)

## 2023-10-25 MED ORDER — HYDROXYUREA 500 MG PO CAPS
1500.0000 mg | ORAL_CAPSULE | ORAL | Status: DC
Start: 2023-10-25 — End: 2023-11-02
  Administered 2023-10-25 – 2023-11-01 (×4): 1500 mg via ORAL
  Filled 2023-10-25 (×4): qty 3

## 2023-10-25 MED ORDER — ASPIRIN 81 MG PO TBEC
81.0000 mg | DELAYED_RELEASE_TABLET | Freq: Every day | ORAL | Status: DC
  Administered 2023-10-26 – 2023-11-02 (×8): 81 mg via ORAL
  Filled 2023-10-25 (×8): qty 1

## 2023-10-25 MED ORDER — NITROGLYCERIN 0.4 MG SL SUBL
0.4000 mg | SUBLINGUAL_TABLET | SUBLINGUAL | Status: DC | PRN
Start: 2023-10-25 — End: 2023-11-03
  Administered 2023-10-25 – 2023-10-29 (×2): 0.4 mg via SUBLINGUAL
  Filled 2023-10-25: qty 1

## 2023-10-25 MED ORDER — BUPROPION HCL ER (SR) 100 MG PO TB12
100.0000 mg | ORAL_TABLET | Freq: Two times a day (BID) | ORAL | Status: DC
  Administered 2023-10-25 – 2023-11-02 (×18): 100 mg via ORAL
  Filled 2023-10-25 (×20): qty 1

## 2023-10-25 MED ORDER — OXYCODONE HCL 5 MG PO TABS
5.0000 mg | ORAL_TABLET | ORAL | Status: DC | PRN
  Administered 2023-10-25 – 2023-11-03 (×23): 5 mg via ORAL
  Filled 2023-10-25 (×22): qty 1

## 2023-10-25 MED ORDER — OXYCODONE-ACETAMINOPHEN 5-325 MG PO TABS
1.0000 | ORAL_TABLET | Freq: Four times a day (QID) | ORAL | Status: DC | PRN
  Administered 2023-10-25: 1 via ORAL
  Filled 2023-10-25: qty 1

## 2023-10-25 MED ORDER — LISINOPRIL 10 MG PO TABS
10.0000 mg | ORAL_TABLET | Freq: Every day | ORAL | Status: DC
  Administered 2023-10-25 – 2023-10-27 (×3): 10 mg via ORAL
  Filled 2023-10-25 (×3): qty 1

## 2023-10-25 MED ORDER — ATORVASTATIN CALCIUM 80 MG PO TABS
80.0000 mg | ORAL_TABLET | Freq: Every day | ORAL | Status: DC
  Administered 2023-10-25 – 2023-11-24 (×31): 80 mg via ORAL
  Filled 2023-10-25 (×31): qty 1

## 2023-10-25 MED ORDER — DULOXETINE HCL 60 MG PO CPEP
60.0000 mg | ORAL_CAPSULE | Freq: Two times a day (BID) | ORAL | Status: DC
  Administered 2023-10-25 – 2023-11-02 (×18): 60 mg via ORAL
  Filled 2023-10-25 (×18): qty 1

## 2023-10-25 MED ORDER — EZETIMIBE 10 MG PO TABS
10.0000 mg | ORAL_TABLET | Freq: Every morning | ORAL | Status: DC
  Administered 2023-10-25 – 2023-11-02 (×9): 10 mg via ORAL
  Filled 2023-10-25 (×9): qty 1

## 2023-10-25 MED ORDER — HYDROXYUREA 500 MG PO CAPS: 1000.0000 mg | ORAL_CAPSULE | Freq: Every day | ORAL | Status: DC

## 2023-10-25 MED ORDER — HYDROCHLOROTHIAZIDE 25 MG PO TABS
25.0000 mg | ORAL_TABLET | Freq: Every morning | ORAL | Status: DC
  Administered 2023-10-25: 25 mg via ORAL
  Filled 2023-10-25: qty 1

## 2023-10-25 MED ORDER — HEPARIN (PORCINE) 25000 UT/250ML-% IV SOLN
1100.0000 [IU]/h | INTRAVENOUS | Status: DC
  Administered 2023-10-25: 1100 [IU]/h via INTRAVENOUS
  Filled 2023-10-25: qty 250

## 2023-10-25 MED ORDER — ONDANSETRON HCL 4 MG/2ML IJ SOLN: 4.0000 mg | Freq: Four times a day (QID) | INTRAMUSCULAR | Status: DC | PRN

## 2023-10-25 MED ORDER — INSULIN ASPART 100 UNIT/ML IJ SOLN: 0.0000 [IU] | Freq: Every day | INTRAMUSCULAR | Status: DC

## 2023-10-25 MED ORDER — APIXABAN 5 MG PO TABS
5.0000 mg | ORAL_TABLET | Freq: Two times a day (BID) | ORAL | Status: DC
Start: 2023-10-25 — End: 2023-10-25

## 2023-10-25 MED ORDER — NITROGLYCERIN 0.4 MG SL SUBL
0.4000 mg | SUBLINGUAL_TABLET | SUBLINGUAL | Status: DC | PRN
  Administered 2023-10-25: 0.4 mg via SUBLINGUAL

## 2023-10-25 MED ORDER — ISOSORBIDE MONONITRATE ER 30 MG PO TB24: 30.0000 mg | ORAL_TABLET | Freq: Every day | ORAL | Status: DC

## 2023-10-25 MED ORDER — HYDROXYUREA 500 MG PO CAPS
1000.0000 mg | ORAL_CAPSULE | ORAL | Status: DC
  Administered 2023-10-26 – 2023-10-31 (×4): 1000 mg via ORAL
  Filled 2023-10-25 (×4): qty 2

## 2023-10-25 MED ORDER — ACETAMINOPHEN 325 MG PO TABS: 650.0000 mg | ORAL_TABLET | ORAL | Status: DC | PRN

## 2023-10-25 MED ORDER — LISINOPRIL 10 MG PO TABS: 5.0000 mg | ORAL_TABLET | Freq: Every day | ORAL | Status: DC

## 2023-10-25 MED ORDER — ASPIRIN 81 MG PO TBEC: 81.0000 mg | DELAYED_RELEASE_TABLET | Freq: Every day | ORAL | Status: DC

## 2023-10-25 MED ORDER — ISOSORBIDE MONONITRATE ER 60 MG PO TB24
60.0000 mg | ORAL_TABLET | Freq: Every day | ORAL | Status: DC
Start: 2023-10-25 — End: 2023-10-29
  Administered 2023-10-25 – 2023-10-28 (×4): 60 mg via ORAL
  Filled 2023-10-25 (×4): qty 1

## 2023-10-25 MED ORDER — FENOFIBRATE 160 MG PO TABS
160.0000 mg | ORAL_TABLET | Freq: Every day | ORAL | Status: DC
  Administered 2023-10-25 – 2023-11-02 (×9): 160 mg via ORAL
  Filled 2023-10-25 (×9): qty 1

## 2023-10-25 MED ORDER — OXYCODONE-ACETAMINOPHEN 10-325 MG PO TABS: 1.0000 | ORAL_TABLET | ORAL | Status: DC | PRN

## 2023-10-25 MED ORDER — OXYCODONE-ACETAMINOPHEN 5-325 MG PO TABS
1.0000 | ORAL_TABLET | ORAL | Status: DC | PRN
  Administered 2023-10-25 – 2023-11-03 (×31): 1 via ORAL
  Filled 2023-10-25 (×31): qty 1

## 2023-10-25 MED ORDER — DILTIAZEM HCL ER COATED BEADS 120 MG PO CP24
120.0000 mg | ORAL_CAPSULE | Freq: Every day | ORAL | Status: DC
  Administered 2023-10-25 – 2023-10-28 (×4): 120 mg via ORAL
  Filled 2023-10-25 (×5): qty 1

## 2023-10-25 MED ORDER — INSULIN ASPART 100 UNIT/ML IJ SOLN
0.0000 [IU] | Freq: Three times a day (TID) | INTRAMUSCULAR | Status: DC
  Administered 2023-10-25 – 2023-10-26 (×6): 2 [IU] via SUBCUTANEOUS
  Administered 2023-10-27 (×3): 3 [IU] via SUBCUTANEOUS
  Administered 2023-10-28 (×2): 2 [IU] via SUBCUTANEOUS
  Administered 2023-10-29: 3 [IU] via SUBCUTANEOUS
  Administered 2023-10-29 – 2023-10-30 (×3): 2 [IU] via SUBCUTANEOUS
  Administered 2023-10-30: 3 [IU] via SUBCUTANEOUS
  Administered 2023-10-31: 2 [IU] via SUBCUTANEOUS
  Administered 2023-10-31: 3 [IU] via SUBCUTANEOUS
  Administered 2023-10-31: 2 [IU] via SUBCUTANEOUS
  Administered 2023-11-01: 3 [IU] via SUBCUTANEOUS
  Administered 2023-11-01: 2 [IU] via SUBCUTANEOUS
  Administered 2023-11-02: 3 [IU] via SUBCUTANEOUS
  Administered 2023-11-02: 2 [IU] via SUBCUTANEOUS
  Administered 2023-11-02: 3 [IU] via SUBCUTANEOUS
  Administered 2023-11-03: 2 [IU] via SUBCUTANEOUS

## 2023-10-25 NOTE — ED Notes (Signed)
Admitting at the bedside.  

## 2023-10-25 NOTE — ED Notes (Signed)
Pt c/o chest pain 

## 2023-10-25 NOTE — Consult Note (Cosign Needed)
 [address]       [place] [address]             [phone]        Katelyn Cline [facility] Medical Record #[id number] Date of Birth: 08-20-1960  Referring: No ref. provider found Primary Care: Christel Cousins, MD Primary Cardiologist:Jonathan Katheryne Pane, MD  Reason for Consult: Evaluation for surgical management of multivessel coronary artery disease and moderate aortic stenosis.   History of Present Illness:   [name] is a 63 year old female with a past history of hypertension, dyslipidemia, type 2 diabetes mellitus, class I obesity, 100-pack-year smoking history, chronic pain syndrome with fibromyalgia and degenerative disc disease, history of atrial fibrillation status post cardioversion, and history of metastatic melanoma.  She also has history of an 11 mm posterior left lower lobe pulmonary nodule recently evaluated with a PET scan and shown to have low metabolic activity.  She has a known history of COPD and is on supplemental oxygen  24 hours a day. [name] has been undergoing cardiac evaluation over the past several months for initial complaints of shortness of breath apparently starting back in September 2024.  She had a calcium  scoring coronary CT in January of this year revealing an elevated calcium  score of 1200% distribution of significant calcification in all 3 coronary systems.  An echocardiogram showed normal LV systolic function with mild concentric left ventricular hypertrophy and moderate aortic stenosis with an aortic valve area of 1.0 cm.  This found to have left heart catheterization on 10/01/2023 demonstrating severe multivessel coronary artery disease.  [name] was referred to Dr. Deloise Ferries for outpatient evaluation of her coronary disease and moderate aortic stenosis.  She was scheduled to see him in the office next week but presented to the emergency room late yesterday after experiencing 4/10 squeezing chest discomfort  radiating to her left arm with more intense chest pain.  EKG in the emergency room showed diffuse ST depressions in leads II , III, aVF, and V4 through V6.  High-sensitivity troponin was mildly elevated at 34 with subsequent assay about 2 hours later measured at 45.  [name] was treated with aspirin  and started on a heparin  although the the heparin  is now on hold due to anemia and report of recent dark tarry stools.  [name] is being admitted to the hospital for further evaluation. CT Surgery has been asked to initiate evaluation her for potential coronary bypass grafting and aortic valve replacement during this admission.  [name] is currently sitting up in bed eating lunch in the emergency room.  She denies any chest pain currently.  She is unaware of any further atrial fibrillation since her cardioversion a few months ago.  She is status post bronchoscopy with right video-assisted thoracoscopy for wedge resection of right upper lobe mass for metastatic melanoma by Dr. Nicanor Barge in 2014.    [name] is edentulous and has full plate upper and lower dentures.   Zubrod Score: At the time of surgery this patient's most appropriate activity status/level should be described as: []     0    Normal activity, no symptoms []     1    Restricted in physical strenuous activity but ambulatory, able to do out light work []     2    Ambulatory and capable of self care, unable to do work activities, up and about  more than 50%  Of the time                            [x]     3    Only limited self care, in bed greater than 50% of waking hours []     4    Completely disabled, no self care, confined to bed or chair []     5    Moribund  Past Medical History:  Diagnosis Date   Agoraphobia    Anxiety    Arthritis    "all joints"   Chronic low back pain    Chronic respiratory failure with hypoxia ([name])    4L  via [place],  followed by pcp,   (09-16-2018  per pt only uses while at home and  at night ,  portable oxygen  is not working)   Clotting disorder ([name]) 2013   Polycythemia vera   Colon polyps    COPD (chronic obstructive pulmonary disease) ([name])    DDD (degenerative disc disease), lumbosacral    Dental caries    DM type 2 (diabetes mellitus, type 2) ([name])    followed by pcp   Fibromyalgia    GERD (gastroesophageal reflux disease)    occasional , no meds   Heart murmur 2020   History of basal cell carcinoma (BCC) excision    s/p  Moh's of face/ nose 12/ 2013   History of palpitations    per pt found to have occaional PVCs   History of UTI    HTN (hypertension)    Hyperlipidemia    Loose, teeth    Major depression    Metastatic melanoma ([name]) 10/18/2012   12 mm posterior right upper lobe pulmonary nodule, max SUV 3.0  s/p  right wedge resection 10-21-2012,  followed by oncologist-- dr Salomon Cree,  no recurrence   Mixed stress and urge urinary incontinence    Myeloproliferative neoplasm ([name])    Narcolepsy    Obesity    OSA (obstructive sleep apnea)    Oxygen  deficiency    Oxygen  dependent    4 L via [name],  per pt portable oxygen  not working but does use while at home and at night   Panic attacks    Periodontitis    Peripheral neuropathy    feet   Polycythemia vera(238.4) hematology/ oncology-- dr Salomon Cree ([facility])   first dx 02014 due to smoker/copd--  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)  hx phlebotomies   Pulmonary nodules    bilateral small stable per CT 03-11-2018   Scoliosis    Sleep apnea    Smokers' cough ([name])    per pt productive a little in the morning's   Symptoms of upper respiratory infection (URI) 03/12/2018   Urinary (tract) obstruction    Wears glasses     Past Surgical History:  Procedure Laterality Date   ABDOMINAL HYSTERECTOMY  1998   partial   BREAST LUMPECTOMY Bilateral 1995   benign per pt   CARDIOVERSION N/A 09/06/2023   Procedure: CARDIOVERSION;  Surgeon: Luana Rumple, MD;  Location: [facility] INVASIVE  CV LAB;  Service: Cardiovascular;  Laterality: N/A;   COLONOSCOPY W/ BIOPSIES AND POLYPECTOMY     Hx: of   CYSTECTOMY  2000   abdominal wall    DILATION AND CURETTAGE OF UTERUS  yrs ago   MOHS SURGERY  2013   nose/face   MULTIPLE EXTRACTIONS WITH ALVEOLOPLASTY N/A 09/19/2018   Procedure: Extraction of  tooth #'s 2, 3, 6-14, 18, and 20-30 with alveoloplasty;  Surgeon: Sanyiah Chroman, DDS;  Location: [facility] ORS;  Service: Oral Surgery;  Laterality: N/A;  GENERAL WITH NASAL TUBE   RIGHT/LEFT HEART CATH AND CORONARY ANGIOGRAPHY N/A 10/01/2023   Procedure: RIGHT/LEFT HEART CATH AND CORONARY ANGIOGRAPHY;  Surgeon: Arleen Lacer, MD;  Location: [facility] INVASIVE CV LAB;  Service: Cardiovascular;  Laterality: N/A;   VIDEO ASSISTED THORACOSCOPY (VATS)/WEDGE RESECTION Right 10/21/2012   Procedure: VIDEO ASSISTED THORACOSCOPY (VATS)/WEDGE RESECTION;  Surgeon: Norita Beauvais, MD;  Location: [facility] OR;  Service: Thoracic;  Laterality: Right;   VIDEO BRONCHOSCOPY N/A 10/21/2012   Procedure: VIDEO BRONCHOSCOPY;  Surgeon: Norita Beauvais, MD;  Location: [facility] OR;  Service: Thoracic;  Laterality: N/A;    Social History   Tobacco Use  Smoking Status Every Day   Current packs/day: 0.50   Average packs/day: 0.5 packs/day for 50.0 years (25.0 ttl pk-yrs)   Types: Cigarettes  Smokeless Tobacco Never  Tobacco Comments   Current smoker 10 cigarettes daily 06/10/23    Social History   Substance and Sexual Activity  Alcohol  Use Yes   Alcohol /week: 1.0 standard drink of alcohol    Types: 1 Standard drinks or equivalent per week   Comment: Not weekly     Allergies  Allergen Reactions   Contrast Media [Iodinated Contrast Media] Anaphylaxis    CAT Scan Conrast    Erythromycin     Stomach pain   Flagyl [Metronidazole] Other (See Comments)    Generalized pain, caused sickness   Latex Other (See Comments)    Was told to be careful because of Dye allergy   Other     Iodine Dye   Penicillins Other (See  Comments)    Patient was an infant, no idea of reaction. Tolerates Keflex . Did it involve swelling of the face/tongue/throat, SOB, or low BP? Unknown Did it involve sudden or severe rash/hives, skin peeling, or any reaction on the inside of your mouth or nose? Unknown Did you need to seek medical attention at a hospital or doctor's office? Unknown When did it last happen?      infant If all above answers are "NO", may proceed with cephalosporin use. CHILDHOOD ALLERGY    Tetracyclines & Related Other (See Comments)    GI side effects    Current Facility-Administered Medications  Medication Dose Route Frequency Provider Last Rate Last Admin   [START ON 10/26/2023] aspirin  EC tablet 81 mg  81 mg Oral Daily Andreae, Andrew E, MD       atorvastatin  (LIPITOR) tablet 80 mg  80 mg Oral QHS Andreae, Andrew E, MD       buPROPion  ER (WELLBUTRIN  SR) 12 hr tablet 100 mg  100 mg Oral BID Andreae, Andrew E, MD   100 mg at 10/25/23 1015   diltiazem  (CARDIZEM  CD) 24 hr capsule 120 mg  120 mg Oral QHS Andreae, Andrew E, MD       DULoxetine  (CYMBALTA ) DR capsule 60 mg  60 mg Oral BID Andreae, Andrew E, MD   60 mg at 10/25/23 0801   ezetimibe  (ZETIA ) tablet 10 mg  10 mg Oral q morning Chauncey Cora, MD   10 mg at 10/25/23 0800   fenofibrate  tablet 160 mg  160 mg Oral Daily Andreae, Andrew E, MD   160 mg at 10/25/23 1015   [START ON 10/26/2023] hydroxyurea  (HYDREA ) capsule 1,000 mg  1,000 mg Oral Once per day on Sunday Tuesday Thursday Saturday Chauncey Cora,  MD       And   hydroxyurea  (HYDREA ) capsule 1,500 mg  1,500 mg Oral Once per day on Monday Wednesday Friday Chauncey Cora, MD       insulin  aspart (novoLOG ) injection 0-15 Units  0-15 Units Subcutaneous TID WC Andreae, Andrew E, MD   2 Units at 10/25/23 1610   insulin  aspart (novoLOG ) injection 0-5 Units  0-5 Units Subcutaneous QHS Andreae, Andrew E, MD       isosorbide  mononitrate (IMDUR ) 24 hr tablet 60 mg  60 mg Oral QHS Johnie Nailer B, NP        lisinopril  (ZESTRIL ) tablet 10 mg  10 mg Oral QHS Johnie Nailer B, NP       nitroGLYCERIN  (NITROSTAT ) SL tablet 0.4 mg  0.4 mg Sublingual Q5 min PRN Chauncey Cora, MD   0.4 mg at 10/25/23 0320   ondansetron  (ZOFRAN ) injection 4 mg  4 mg Intravenous Q6H PRN Chauncey Cora, MD       oxyCODONE -acetaminophen  (PERCOCET/ROXICET) 5-325 MG per tablet 1 tablet  1 tablet Oral Q4H PRN Young Hensen, RPH   1 tablet at 10/25/23 1014   And   oxyCODONE  (Oxy IR/ROXICODONE ) immediate release tablet 5 mg  5 mg Oral Q4H PRN Young Hensen, RPH   5 mg at 10/25/23 1014   Current Outpatient Medications  Medication Sig Dispense Refill   albuterol  (VENTOLIN  HFA) 108 (90 Base) MCG/ACT inhaler Inhale 2 puffs into the lungs every 6 (six) hours as needed for wheezing or shortness of breath (as needed). 17 each 3   aspirin  EC 81 MG tablet Take 1 tablet (81 mg total) by mouth daily. Swallow whole. 100 tablet 4   atorvastatin  (LIPITOR) 80 MG tablet Take 1 tablet by mouth daily at bedtime. 90 tablet 0   buPROPion  ER (WELLBUTRIN  SR) 100 MG 12 hr tablet Take 1 tablet by mouth every morning and take 1 tablet by mouth every day at bedtime. 180 tablet 0   diltiazem  (CARDIZEM  CD) 120 MG 24 hr capsule Take 1 capsule (120 mg total) by mouth daily. (Patient taking differently: Take 120 mg by mouth at bedtime.) 90 capsule 0   DULoxetine  (CYMBALTA ) 60 MG capsule Take 1 capsule by mouth twice daily. 180 capsule 0   ELIQUIS  5 MG TABS tablet Take 1 tablet (5 mg total) by mouth 2 (two) times daily. 180 tablet 0   ezetimibe  (ZETIA ) 10 MG tablet Take 1 tablet by mouth every morning. 90 tablet 0   fenofibrate  (TRICOR ) 145 MG tablet Take 1 tablet by mouth daily at bedtime. 90 tablet 0   fish oil-omega-3 fatty acids  1000 MG capsule Take 2 capsules (2 g total) by mouth 2 (two) times daily. 180 capsule 1   furosemide  (LASIX ) 20 MG tablet Take 1 tablet (20 mg total) by mouth daily as needed. 30 tablet 3   hydrochlorothiazide   (HYDRODIURIL ) 25 MG tablet Take 1 tablet by mouth every morning. 90 tablet 0   hydroxyurea  (HYDREA ) 500 MG capsule Take 2 capsules by mouth daily at bedtime with an additional 1 capsule on Monday and Thursday. (Patient taking differently: Take 1,000-1,500 mg by mouth See admin instructions. Take 2 capsules at daily at bedtime with 1 additional capsule on Mon, Wed, Fri nigtht) 90 capsule 2   isosorbide  mononitrate (IMDUR ) 30 MG 24 hr tablet Take 1 tablet (30 mg total) by mouth at bedtime. 30 tablet 3   lisinopril  (ZESTRIL ) 5 MG tablet Take 1 tablet by mouth daily at bedtime.  90 tablet 0   metFORMIN  (GLUCOPHAGE ) 1000 MG tablet Take 1 tablet by mouth every morning and take 1 tablet by mouth every day at bedtime. 180 tablet 0   nitroGLYCERIN  (NITROSTAT ) 0.4 MG SL tablet Place 1 tablet (0.4 mg total) under the tongue every 5 (five) minutes as needed for chest pain (up to 3 doses. If taking 3rd dose call 911). 25 tablet 3   nystatin  (MYCOSTATIN /NYSTOP ) powder Apply 1 Application topically 3 (three) times daily. PA Case: [id number] Approved 06/30/2019- 06/28/2020 (Patient taking differently: Apply 1 Application topically 3 (three) times daily as needed (irritation). PA Case: [id number] Approved 06/30/2019- 06/28/2020) 60 g 2   nystatin  cream (MYCOSTATIN ) Apply topically twice daily. (Patient taking differently: Apply 1 Application topically 2 (two) times daily as needed for dry skin.) 60 g 1   oxyCODONE -acetaminophen  (PERCOCET) 10-325 MG tablet Take 1 tablet by mouth every 4 (four) hours as needed for severe pain (pain score 7-10).     potassium chloride  SA (KLOR-CON  M) 20 MEQ tablet Take 1 tablet (20 mEq total) by mouth daily as needed. Take if take furosemide  30 tablet 3   senna (SENOKOT) 8.6 MG TABS tablet Take 2 tablets by mouth daily as needed for mild constipation.     Vitamin D , Ergocalciferol , (DRISDOL ) 1.25 MG (50000 UNIT) CAPS capsule Take 1 capsule by mouth on Sundays at bedtime. 12 capsule 0   Blood Glucose  Monitoring Suppl (BLOOD GLUCOSE SYSTEM PAK) KIT Please dispense based on patient and insurance preference. Use as directed to monitor FSBS 2x daily. Dx: E11.9 1 kit 1   glucose blood (ACCU-CHEK GUIDE) test strip Use as instructed 100 each 12   Glucose Blood (BLOOD GLUCOSE TEST STRIPS) STRP Please dispense based on patient and insurance preference. Use as directed to monitor FSBS 2x daily. Dx: E11.9 100 strip 1   Microlet Lancets MISC Please dispense based on patient and insurance preference. Use as directed to monitor FSBS 2x daily. Dx: E11.9 100 each 11    (Not in a hospital admission)   Family History  Problem Relation Age of Onset   Arthritis Mother    Hyperlipidemia Mother    Depression Mother    Anxiety disorder Mother    Dementia Mother    Hypertension Father    Hyperlipidemia Father    Heart disease Father    Stroke Father    Dementia Father    Heart disease Brother    ADD / ADHD Son    Alcohol  abuse Maternal Grandfather    Bipolar disorder Neg Hx    Drug abuse Neg Hx    OCD Neg Hx    Paranoid behavior Neg Hx    Schizophrenia Neg Hx    Seizures Neg Hx    Sexual abuse Neg Hx    Physical abuse Neg Hx      Review of Systems:      Cardiac Review of Systems: Y or  [    ]= no  Chest Pain [  x  ]  Resting SOB [ x  ] Exertional SOB  [ x ]  Orthopnea [  ]   Pedal Edema [   ]    Palpitations [  ] Syncope  [  ]   Presyncope [   ]  General Review of Systems: [Y] = yes [  ]=no Constitional: recent weight change [  ]; anorexia [  ]; fatigue [  ]; nausea [  ]; night sweats [  ]; fever [  ];  or chills [  ]                                                               Dental: Last Dentist visit:   Eye : blurred vision [  ]; diplopia [   ]; vision changes [  ];  Amaurosis fugax[  ]; Resp: cough [  ];  wheezing[ x];  hemoptysis[  ]; shortness of breath[x  ]; paroxysmal nocturnal dyspnea[  ]; dyspnea on exertion[x  ]; or orthopnea[  ];  GI:  gallstones[  ], vomiting[  ];  dysphagia[   ]; melena[ x ];  hematochezia [  ]; heartburn[  ];   Hx of  Colonoscopy[ 30 years ago  ]; GU: kidney stones [  ]; hematuria[  ];   dysuria [  ];  nocturia[  ];  history of     obstruction [  ]; urinary frequency [  ]             Skin: rash, swelling[  ];, hair loss[  ];  peripheral edema[x  ];  or itching[  ]; Musculosketetal: myalgias[  ];  joint swelling[  ];  joint erythema[  ];  joint pain[  ];  back pain[  ];  Heme/Lymph: bruising[  ];  bleeding[  ];  anemia[  ];  Neuro: TIA[  ];  headaches[  ];  stroke[  ];  vertigo[  ];  seizures[  ];   paresthesias[  ];  difficulty walking[  ];  Psych:depression[  ]; anxiety[  ];  Endocrine: diabetes[ x ];  thyroid  dysfunction[  ];               Physical Exam: BP (!) 150/65   Pulse 73   Temp 98 F (36.7 C) (Axillary)   Resp (!) 21   Ht 5' 8.5" (1.74 m)   Wt 93.9 kg   SpO2 94%   BMI 31.02 kg/m    General appearance: alert, cooperative, and no distress Head: Normocephalic, without obvious abnormality, atraumatic Neck: no adenopathy, no carotid bruit, no JVD, and supple, symmetrical, trachea midline Lymph nodes: No cervical or clavicular adenopathy Resp: , Clear on inspiration, few expiratory wheezes present Cardio: Regular rate and rhythm, grade 4/6 systolic murmur heard throughout the precordium GI: Soft, no tenderness, active bowel sounds Extremities: No deformities.  All are warm and have brisk capillary refill.  Radial pulses are palpable.  I am unable to palpate posterior tibial or dorsalis pedis pulses. Neurologic: Grossly normal  Diagnostic Studies & Laboratory data:  RIGHT/LEFT HEART CATH AND CORONARY ANGIOGRAPHY   Conclusion      Mid LM to Ost LAD lesion is 45% stenosed.   1st Diag lesion is 90% stenosed.   Mid LAD-1 lesion is 70% stenosed.   Mid LAD-2 lesion is 30% stenosed. Mid LAD to Dist LAD lesion is 70% stenosed. Dist LAD lesion is 40% stenosed.   Prox Cx lesion is 80% stenosed (essentially the ostium of the AV  groove portion of the Cx).   1st Mrg lesion is 90% stenosed.   .   Dist Cx lesion is 60% stenosed just prior to 2nd Mrg & 3rd Mrg lesion is 80% stenosed.   Prox RCA to Dist RCA lesion is 100% stenosed.  Retrograde  flow from septal collaterals fills the PDA with retrograde flow to the occlusion site.   LV end diastolic pressure is low.   Dominance: Right  POST-OPERATIVE DIAGNOSIS:  Mean AoV gradient 19 mmHg with systemic hypertension Multivessel disease: Proximal RCA on percent CTO with the distal portion filling via left-to-right collaterals mostly the PDA.  (Cannot exclude codominant RCA) Distal left main eccentric calcified 40 proximately 50% stenosis of the vessel bifurcates into the LAD and LCx LCx bifurcates quickly into the AV groove and large OM1:  OM1 has proximal 90% stenosis and is a large-caliber vessel Ostial AVG-LCx has 80% stenosis and gives rise to small caliber LPL 1(OM2 on image) and LPL 3 (on image LPL1) and large caliber LPL 2 (on image OM3).  60% stenosis just proximal to LPL 1 and then the LPL 2 continuation of the circumflex has 80% stenosis. LAD gives off a large caliber D1 that has a very proximal focal 90% stenosis before it bifurcates into 2 large branches.  There is diffuse disease throughout the vessel.  The LAD after D1 has a focal 70% stenosis.  Then in the mid vessel there is a bend the segment that starts with 30% followed by 70% and then 60%. Relatively normal right heart cath numbers with mean PAP of 18 mmHg and PCWP of 13 mmHg. Normal Cardiac Output and Index by Fick: 6.37-3.08.     RECOMMENDATIONS Discharge to home after PACU;  Recommend outpatient CVTS consultation => patient has moderate MR on Echo and Multivessel Disease, best served with CABG/AVR; multiple lesions and likely incomplete revascularization if multivessel PCI is the option chosen.   ECHOCARDIOGRAM REPORT       Patient Name:   Katelyn Cline Date of Exam: 07/19/2023  Medical Rec #:   [id number]        Height:       69.0 in  Accession #:    [id number]       Weight:       209.0 lb  Date of Birth:  29-Sep-1960        BSA:          2.105 m  Patient Age:    62 years         BP:           149/85 mmHg  Patient Gender: F                HR:           81 bpm.  Exam Location:  Outpatient   Procedure: 2D Echo, 3D Echo, Cardiac Doppler, Color Doppler and Strain  Analysis   Indications:   R06.02 SOB; R60.0 Lower extremity edema; I48.91*  Unspeicified                 atrial fibrillation    History:        Patient has no prior history of Echocardiogram  examinations.                 COPD, Arrythmias:Atrial Fibrillation and PVC,                  Signs/Symptoms:Shortness of Breath and Edema; Risk                  Factors:Hypertension, Diabetes, Dyslipidemia, Current  Smoker and                  Sleep Apnea. Patient denies chest pain. She does have SOB  (on  continuous O2) and leg edema.    Sonographer:    Richarda Chance RVT, RDCS (AE), RDMS  Referring Phys: [id number] JONATHAN J BERRY   IMPRESSIONS     1. Left ventricular ejection fraction, by estimation, is 55 to 60%. Left  ventricular ejection fraction by 3D volume is 58 %. The left ventricle has  normal function. The left ventricle has no regional wall motion  abnormalities. There is mild left  ventricular hypertrophy. Left ventricular diastolic parameters are  indeterminate.   2. Right ventricular systolic function is normal. The right ventricular  size is mildly enlarged. Tricuspid regurgitation signal is inadequate for  assessing PA pressure.   3. Left atrial size was moderately dilated.   4. The mitral valve is degenerative. Trivial mitral valve regurgitation.  Severe mitral annular calcification.   5. The aortic valve is calcified. There is moderate calcification of the  aortic valve. Aortic valve regurgitation is not visualized. Moderate  aortic valve stenosis. Mild AS by gradients (Vmax 2.6 m/s, MG  ),  moderate by AVA (1.0cm^2) and DI (0.33).   Low SV index (24 cc/m^2), suspect paradoxical low flow low gradient  moderate AS   6. Aortic dilatation noted. There is mild dilatation of the ascending  aorta, measuring 38 mm.   7. The inferior vena cava is normal in size with greater than 50%  respiratory variability, suggesting right atrial pressure of 3 mmHg.   FINDINGS   Left Ventricle: Left ventricular ejection fraction, by estimation, is 55  to 60%. Left ventricular ejection fraction by 3D volume is 58 %. The left  ventricle has normal function. The left ventricle has no regional wall  motion abnormalities. The left  ventricular internal cavity size was normal in size. There is mild left  ventricular hypertrophy. Left ventricular diastolic parameters are  indeterminate.   Right Ventricle: The right ventricular size is mildly enlarged. No  increase in right ventricular wall thickness. Right ventricular systolic  function is normal. Tricuspid regurgitation signal is inadequate for  assessing PA pressure.   Left Atrium: Left atrial size was moderately dilated.   Right Atrium: Right atrial size was normal in size.   Pericardium: There is no evidence of pericardial effusion.   Mitral Valve: The mitral valve is degenerative in appearance. Severe  mitral annular calcification. Trivial mitral valve regurgitation. MV peak  gradient, 7.8 mmHg. The mean mitral valve gradient is 3.0 mmHg.   Tricuspid Valve: The tricuspid valve is normal in structure. Tricuspid  valve regurgitation is trivial.   Aortic Valve: The aortic valve is calcified. There is moderate  calcification of the aortic valve. Aortic valve regurgitation is not  visualized. Moderate aortic stenosis is present. Aortic valve mean  gradient measures 13.8 mmHg. Aortic valve peak gradient   measures 26.4 mmHg. Aortic valve area, by VTI measures 1.10 cm.   Pulmonic Valve: The pulmonic valve was not well visualized.  Pulmonic valve  regurgitation is trivial.   Aorta: The aortic root is normal in size and structure and aortic  dilatation noted. There is mild dilatation of the ascending aorta,  measuring 38 mm.   Venous: The inferior vena cava is normal in size with greater than 50%  respiratory variability, suggesting right atrial pressure of 3 mmHg.   IAS/Shunts: The atrial septum is grossly normal.     LEFT VENTRICLE  PLAX 2D  LVIDd:         5.11 cm         Diastology  LVIDs:  3.58 cm         LV e' medial:    7.87 cm/s  LV PW:         1.16 cm         LV E/e' medial:  18.9  LV IVS:        0.98 cm         LV e' lateral:   9.93 cm/s  LVOT diam:     1.90 cm         LV E/e' lateral: 15.0  LV SV:         51  LV SV Index:   24  LVOT Area:     2.84 cm        3D Volume EF                                 LV 3D EF:    Left                                              ventricul                                              ar                                              ejection                                              fraction                                              by 3D                                              volume is                                              58 %.                                   3D Volume EF:                                 3D EF:        58 %  LV EDV:       169 ml                                 LV ESV:       71 ml                                 LV SV:        98 ml   RIGHT VENTRICLE  RV S prime:     12.80 cm/s  TAPSE (M-mode): 1.5 cm   LEFT ATRIUM              Index        RIGHT ATRIUM           Index  LA diam:        4.00 cm  1.90 cm/m   RA Area:     17.90 cm  LA Vol (A2C):   112.0 ml 53.21 ml/m  RA Volume:   49.20 ml  23.37 ml/m  LA Vol (A4C):   89.2 ml  42.37 ml/m  LA Biplane Vol: 101.0 ml 47.98 ml/m   AORTIC VALVE                     PULMONIC VALVE  AV Area (Vmax):    0.99 cm      PV Vmax:          1.25  m/s  AV Area (Vmean):   0.99 cm      PV Peak grad:     6.2 mmHg  AV Area (VTI):     1.10 cm      PR End Diast Vel: 5.99 msec  AV Vmax:           257.00 cm/s  AV Vmean:          175.250 cm/s  AV VTI:            0.462 m  AV Peak Grad:      26.4 mmHg  AV Mean Grad:      13.8 mmHg  LVOT Vmax:         89.90 cm/s  LVOT Vmean:        61.000 cm/s  LVOT VTI:          0.180 m  LVOT/AV VTI ratio: 0.39    AORTA  Ao Root diam: 3.30 cm  Ao Asc diam:  3.80 cm  Ao Arch diam: 2.8 cm   MITRAL VALVE  MV Area (PHT): 4.21 cm     SHUNTS  MV Area VTI:   1.89 cm     Systemic VTI:  0.18 m  MV Peak grad:  7.8 mmHg     Systemic Diam: 1.90 cm  MV Mean grad:  3.0 mmHg  MV Vmax:       1.40 m/s  MV Vmean:      78.4 cm/s  MV Decel Time: 180 msec  MV E velocity: 149.00 cm/s   Carson Clara MD  Electronically signed by Carson Clara MD  Signature Date/Time: 07/19/2023/12:46:04 PM        Final          Recent Radiology Findings:   DG Chest Portable 1 View Result Date: 10/25/2023 EXAM: 1 VIEW(S) XRAY OF THE CHEST 10/25/2023 01:01:07 AM COMPARISON: 07/16/2023 CLINICAL HISTORY: Chest pain. FINDINGS: LUNGS AND  PLEURA: Mild scarring in the right upper lobe. No consolidation. No pulmonary edema. No pleural effusion. No pneumothorax. HEART AND MEDIASTINUM: No acute abnormality of the cardiac and mediastinal silhouettes. BONES AND SOFT TISSUES: No acute osseous abnormality. IMPRESSION: 1. No acute process. Electronically signed by: Zadie Herter MD 10/25/2023 01:10 AM EDT RP Workstation: [id number]     I have independently reviewed the above radiologic studies and discussed with the patient   Recent Lab Findings: Lab Results  Component Value Date   WBC 11.3 (H) 10/25/2023   HGB 8.3 (L) 10/25/2023   HCT 29.6 (L) 10/25/2023   PLT 283 10/25/2023   GLUCOSE 145 (H) 10/25/2023   CHOL 115 09/08/2023   TRIG 108 09/08/2023   HDL 38 (L) 09/08/2023   LDLDIRECT 116.0 11/17/2022   LDLCALC 57  09/08/2023   ALT 9 09/13/2023   AST 11 (L) 09/13/2023   NA 139 10/25/2023   K 3.5 10/25/2023   CL 103 10/25/2023   CREATININE 0.53 10/25/2023   BUN 12 10/25/2023   CO2 26 10/25/2023   TSH 2.51 11/17/2022   INR 1.00 03/12/2018   HGBA1C 5.8 11/17/2022      Assessment / Plan:      - Multivessel coronary artery disease with concurrent moderate aortic stenosis presenting with angina:  She has preserved biventricular function.  Would likely benefit from coronary bypass grafting and aortic valve replacement but with increased surgical risk due to her severe O2- dependent COPD.    - Heme positive stool, anemia: GI consult has been requested.  Also has history of polycythemia vera.  -Type 2 diabetes mellitus: On metformin  prior to admission.  Hemoglobin A1c 5.8 about 1 year ago  -History of paroxysmal atrial fibrillation.  In sinus rhythm on admission following recent cardioversion.  On Cardizem  and Eliquis  prior to admission.  Anticoagulants on hold due to presentation with anemia and suspected GI bleed.  - History of chronic pain syndrome with fibromyalgia and degenerative disc disease.  Takes Cymbalta  and Wellbutrin .  - Peripheral arterial disease: Recent Dopplers done in March of this year show right ABI of 0.53 and left ABI 0.59 with bilateral SFA disease.  Will order carotid duplex scan.  -History of pulmonary malignant melanoma: Status post right upper lobe wedge resection in 2014 by Dr. Nicanor Barge.  - Dr. Deloise Ferries will review and assess surgical risks.  Recommendations regarding management will follow.   I  spent 25 minutes counseling the patient face to face.  Leata Providence, PA-C  10/25/2023 11:29 AM   Agree with above 63yo female admitted with anginal symptoms in the setting of anemia.  She has a history of 3V CAD, and moderate aortic stenosis.  She has been on eliquis  for paroxysmal atrial fibrillation.  Will plan for AVR/CABG, and atriclip placement with possible  left atrial MAZE.  Due to OR scheduling the earliest slot will be next week.  If her Hgb remains stable off anticoagulation, then can defer endoscopy.  Harrell [name]

## 2023-10-25 NOTE — ED Provider Notes (Signed)
 Discussed exam findings with Dr. Leandrew Proctor Discussed that we found patient to have Hemoccult positive, but no obvious blood or melena.  The anemia may be related to her hydroxyurea  and not an active GI bleed Given her active chest pain and high risk, he will start heparin  for now she would need to be closely monitored   Eldon Greenland, MD 10/25/23 [phone]

## 2023-10-25 NOTE — H&P (Addendum)
 Cardiology Admission History and Physical:   Patient ID: CAELEY MCCLYMONDS MRN: [id number]; DOB: 01-10-1961   Admission date: 10/25/2023  Primary Care Provider: Christel Cousins, MD [facility] Cardiologist: Lauro Portal, MD  [facility] Electrophysiologist:  None   Chief Complaint:  chest pain  Patient Profile:   Katelyn Cline is a 63 y.o. female with a hx of 3v CAD, atrial fibrillation, COPD, 100 pack year smoking history, diabetes, HTN, HLD, PAD, moderate aortic stenosis is being seen today for the evaluation of unstable angina at the request of the ED.   History of Present Illness:    Katelyn Cline is presenting with chest pain that started around 6pm. Her chest pain radiated down her .   Cath eavaluation earlier this month with diffuse disease. EKG showed diffuse ST depressions with aVR elevation. Troponin 34  At 6pm, she had an episode of chest pain rated at a 4/10 described as tight squeezing across her central chest radiating down her left arm. She took some nitroglycerine which helped, but it returned again in about an hour but stronger. It then returned a 3rd time, this time, "much stronger" rated at 6/10.  In the ED had chest pain radiating to her jaw, which was relieved with SL nitroglycerine.  Per Dr Katheryne Pane 09/08/2023 Visit Note: "HPI:  AFIA ZASADA is a 63 y.o.  moderately overweight divorced Caucasian female mother of 1 son who was referred to me by Dr. Waldo Guitar, her PCP, for atrial fibrillation.  I last saw her in the office.  She is 06/08/2023.  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  She was disabled because of panic attacks and agoraphobia.  Her risk factors include greater than 100 pack years of tobacco abuse having smoked 2 packs a day since she was 63 years old, currently smoking 1/2 pack/day.  She has been on supplemental oxygen  24/7 for the last 12 years.  She has a diagnosis of COPD.  Her other risk factors include  treated diabetes, hypertension and hyperlipidemia.  She is a strong family history for heart disease with a mother who had stent, father had bypass surgery and a brother who had a myocardial infarction.   She is apparently down in [place]  on vacation in [place] in September and was awakened with shortness of breath.  She was evaluated at [facility] although the specifics of the hospitalization are unclear.  She was told that she was in "heart failure" and initially they recommended cardiac catheterization but this was never performed.  She really denies chest pain.   Since I last saw her she has undergone outpatient DC cardioversion by Dr. Alvis Ba on Monday successfully to sinus rhythm.  She had a coronary calcium  score performed 07/19/2023 which showed a total of 1238 with calcium  distributed in all 3 coronary arteries.  A 2D echo revealed normal LV systolic function, mild concentric LVH with moderate aortic stenosis and a valve area of 1 cm.  She has a cardiac PET study scheduled next week."   Past Medical History:  Diagnosis Date   Agoraphobia    Anxiety    Arthritis    "all joints"   Chronic low back pain    Chronic respiratory failure with hypoxia ([name])    4L  via [place],  followed by pcp,   (09-16-2018  per pt only uses while at home and at night ,  portable oxygen  is not working)  Clotting disorder ([name]) 2013   Polycythemia vera   Colon polyps    COPD (chronic obstructive pulmonary disease) ([name])    DDD (degenerative disc disease), lumbosacral    Dental caries    DM type 2 (diabetes mellitus, type 2) ([name])    followed by pcp   Fibromyalgia    GERD (gastroesophageal reflux disease)    occasional , no meds   Heart murmur 2020   History of basal cell carcinoma (BCC) excision    s/p  Moh's of face/ nose 12/ 2013   History of palpitations    per pt found to have occaional PVCs   History of UTI    HTN (hypertension)    Hyperlipidemia    Loose, teeth    Major depression     Metastatic melanoma ([name]) 10/18/2012   12 mm posterior right upper lobe pulmonary nodule, max SUV 3.0  s/p  right wedge resection 10-21-2012,  followed by oncologist-- dr Salomon Cree,  no recurrence   Mixed stress and urge urinary incontinence    Myeloproliferative neoplasm ([name])    Narcolepsy    Obesity    OSA (obstructive sleep apnea)    Oxygen  deficiency    Oxygen  dependent    4 L via [place],  per pt portable oxygen  not working but does use while at home and at night   Panic attacks    Periodontitis    Peripheral neuropathy    feet   Polycythemia vera(238.4) hematology/ oncology-- dr Salomon Cree ([facility])   first dx 02014 due to smoker/copd--  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)  hx phlebotomies   Pulmonary nodules    bilateral small stable per CT 03-11-2018   Scoliosis    Sleep apnea    Smokers' cough ([name])    per pt productive a little in the morning's   Symptoms of upper respiratory infection (URI) 03/12/2018   Urinary (tract) obstruction    Wears glasses     Past Surgical History:  Procedure Laterality Date   ABDOMINAL HYSTERECTOMY  1998   partial   BREAST LUMPECTOMY Bilateral 1995   benign per pt   CARDIOVERSION N/A 09/06/2023   Procedure: CARDIOVERSION;  Surgeon: Luana Rumple, MD;  Location: [facility] INVASIVE CV LAB;  Service: Cardiovascular;  Laterality: N/A;   COLONOSCOPY W/ BIOPSIES AND POLYPECTOMY     Hx: of   CYSTECTOMY  2000   abdominal wall    DILATION AND CURETTAGE OF UTERUS  yrs ago   MOHS SURGERY  2013   nose/face   MULTIPLE EXTRACTIONS WITH ALVEOLOPLASTY N/A 09/19/2018   Procedure: Extraction of tooth #'s 2, 3, 6-14, 18, and 20-30 with alveoloplasty;  Surgeon: Jeweliana Chroman, DDS;  Location: [facility] ORS;  Service: Oral Surgery;  Laterality: N/A;  GENERAL WITH NASAL TUBE   RIGHT/LEFT HEART CATH AND CORONARY ANGIOGRAPHY N/A 10/01/2023   Procedure: RIGHT/LEFT HEART CATH AND CORONARY ANGIOGRAPHY;  Surgeon: Arleen Lacer, MD;  Location: [facility]  INVASIVE CV LAB;  Service: Cardiovascular;  Laterality: N/A;   VIDEO ASSISTED THORACOSCOPY (VATS)/WEDGE RESECTION Right 10/21/2012   Procedure: VIDEO ASSISTED THORACOSCOPY (VATS)/WEDGE RESECTION;  Surgeon: Norita Beauvais, MD;  Location: [facility] OR;  Service: Thoracic;  Laterality: Right;   VIDEO BRONCHOSCOPY N/A 10/21/2012   Procedure: VIDEO BRONCHOSCOPY;  Surgeon: Norita Beauvais, MD;  Location: [facility] OR;  Service: Thoracic;  Laterality: N/A;     Medications Prior to Admission: Prior to Admission medications   Medication Sig Start Date End Date Taking? Authorizing Provider  acetaminophen  (TYLENOL ) 500 MG tablet Take 1,000 mg by mouth every 8 (eight) hours as needed for moderate pain (pain score 4-6).   Yes [provider]  albuterol  (VENTOLIN  HFA) 108 (90 Base) MCG/ACT inhaler Inhale 2 puffs into the lungs every 6 (six) hours as needed for wheezing or shortness of breath (as needed). 06/18/23  Yes Christel Cousins, MD  aspirin  EC 81 MG tablet Take 1 tablet (81 mg total) by mouth daily. Swallow whole. 11/17/22  Yes Christel Cousins, MD  atorvastatin  (LIPITOR) 80 MG tablet Take 1 tablet by mouth daily at bedtime. 10/06/23  Yes Christel Cousins, MD  buPROPion  ER (WELLBUTRIN  SR) 100 MG 12 hr tablet Take 1 tablet by mouth every morning and take 1 tablet by mouth every day at bedtime. 10/06/23  Yes Christel Cousins, MD  diltiazem  (CARDIZEM  CD) 120 MG 24 hr capsule Take 1 capsule (120 mg total) by mouth daily. Patient taking differently: Take 120 mg by mouth at bedtime. 09/07/23  Yes Christel Cousins, MD  DULoxetine  (CYMBALTA ) 60 MG capsule Take 1 capsule by mouth twice daily. 08/07/23  Yes Christel Cousins, MD  ELIQUIS  5 MG TABS tablet Take 1 tablet (5 mg total) by mouth 2 (two) times daily. 09/07/23  Yes Christel Cousins, MD  ezetimibe  (ZETIA ) 10 MG tablet Take 1 tablet by mouth every morning. 10/06/23  Yes Christel Cousins, MD  fenofibrate  (TRICOR ) 145 MG tablet Take 1 tablet by mouth daily at bedtime.  10/06/23  Yes Christel Cousins, MD  fish oil-omega-3 fatty acids  1000 MG capsule Take 2 capsules (2 g total) by mouth 2 (two) times daily. 11/17/22  Yes Christel Cousins, MD  furosemide  (LASIX ) 20 MG tablet Take 1 tablet (20 mg total) by mouth daily as needed. 04/07/23  Yes Christel Cousins, MD  hydrochlorothiazide  (HYDRODIURIL ) 25 MG tablet Take 1 tablet by mouth every morning. 10/06/23  Yes Christel Cousins, MD  hydroxyurea  (HYDREA ) 500 MG capsule Take 2 capsules by mouth daily at bedtime with an additional 1 capsule on Monday and Thursday. 10/06/23  Yes Frankie Israel, MD  isosorbide  mononitrate (IMDUR ) 30 MG 24 hr tablet Take 1 tablet (30 mg total) by mouth at bedtime. 10/02/23 11/01/23 Yes Arleen Lacer, MD  lisinopril  (ZESTRIL ) 5 MG tablet Take 1 tablet by mouth daily at bedtime. 10/06/23  Yes Christel Cousins, MD  metFORMIN  (GLUCOPHAGE ) 1000 MG tablet Take 1 tablet by mouth every morning and take 1 tablet by mouth every day at bedtime. 10/06/23  Yes Christel Cousins, MD  nitroGLYCERIN  (NITROSTAT ) 0.4 MG SL tablet Place 1 tablet (0.4 mg total) under the tongue every 5 (five) minutes as needed for chest pain (up to 3 doses. If taking 3rd dose call 911). 10/02/23  Yes Arleen Lacer, MD  nystatin  (MYCOSTATIN /NYSTOP ) powder Apply 1 Application topically 3 (three) times daily. PA Case: [id number] Approved 06/30/2019- 06/28/2020 Patient taking differently: Apply 1 Application topically 3 (three) times daily as needed (irritation). PA Case: [id number] Approved 06/30/2019- 06/28/2020 05/18/22  Yes Christel Cousins, MD  nystatin  cream (MYCOSTATIN ) Apply topically twice daily. Patient taking differently: Apply 1 Application topically 2 (two) times daily as needed for dry skin. 07/11/23  Yes Christel Cousins, MD  oxyCODONE -acetaminophen  (PERCOCET) 10-325 MG tablet Take 1 tablet by mouth every 4 (four) hours as needed for severe pain (pain score 7-10). 07/18/18  Yes [place], Marvell Slider, MD  potassium chloride  SA (KLOR-CON  M)  20 MEQ  tablet Take 1 tablet (20 mEq total) by mouth daily as needed. Take if take furosemide  04/07/23  Yes Christel Cousins, MD  senna (SENOKOT) 8.6 MG TABS tablet Take 2 tablets by mouth daily as needed for mild constipation.   Yes [provider]  Vitamin D , Ergocalciferol , (DRISDOL ) 1.25 MG (50000 UNIT) CAPS capsule Take 1 capsule by mouth on Sundays at bedtime. 09/07/23  Yes Christel Cousins, MD  Blood Glucose Monitoring Suppl (BLOOD GLUCOSE SYSTEM PAK) KIT Please dispense based on patient and insurance preference. Use as directed to monitor FSBS 2x daily. Dx: E11.9 10/18/19   Mathis Som, MD  glucose blood (ACCU-CHEK GUIDE) test strip Use as instructed 04/27/22   Christel Cousins, MD  Glucose Blood (BLOOD GLUCOSE TEST STRIPS) STRP Please dispense based on patient and insurance preference. Use as directed to monitor FSBS 2x daily. Dx: E11.9 10/18/19   Mathis Som, MD  Microlet Lancets MISC Please dispense based on patient and insurance preference. Use as directed to monitor FSBS 2x daily. Dx: E11.9 10/18/19   Mathis Som, MD     Allergies:    Allergies  Allergen Reactions   Contrast Media [Iodinated Contrast Media] Anaphylaxis    CAT Scan Conrast    Erythromycin     Stomach pain   Flagyl [Metronidazole] Other (See Comments)    Generalized pain, caused sickness   Latex Other (See Comments)    Was told to be careful because of Dye allergy   Other     Iodine Dye   Penicillins Other (See Comments)    Patient was an infant, no idea of reaction. Tolerates Keflex . Did it involve swelling of the face/tongue/throat, SOB, or low BP? Unknown Did it involve sudden or severe rash/hives, skin peeling, or any reaction on the inside of your mouth or nose? Unknown Did you need to seek medical attention at a hospital or doctor's office? Unknown When did it last happen?      infant If all above answers are "NO", may proceed with cephalosporin use. CHILDHOOD ALLERGY     Tetracyclines & Related Other (See Comments)    GI side effects    Social History:   Social History   Socioeconomic History   Marital status: Divorced    Spouse name: Not on file   Number of children: 1   Years of education: 12+   Highest education level: Not on file  Occupational History   Occupation: disabled  Tobacco Use   Smoking status: Every Day    Current packs/day: 0.50    Average packs/day: 0.5 packs/day for 50.0 years (25.0 ttl pk-yrs)    Types: Cigarettes   Smokeless tobacco: Never   Tobacco comments:    Current smoker 10 cigarettes daily 06/10/23  Vaping Use   Vaping status: Former   Quit date: 09/15/2016  Substance and Sexual Activity   Alcohol  use: Yes    Alcohol /week: 1.0 standard drink of alcohol     Types: 1 Standard drinks or equivalent per week    Comment: Not weekly   Drug use: Never   Sexual activity: Not Currently    Birth control/protection: Surgical  Other Topics Concern   Not on file  Social History Narrative   Son-lives w/son.   disabled   Social Drivers of Corporate investment banker Strain: Low Risk  (12/24/2022)   Overall Financial Resource Strain (CARDIA)    Difficulty of Paying Living Expenses: Not hard at all  Food Insecurity: No Food  Insecurity (12/24/2022)   Hunger Vital Sign    Worried About Running Out of Food in the Last Year: Never true    Ran Out of Food in the Last Year: Never true  Transportation Needs: No Transportation Needs (12/24/2022)   PRAPARE - Administrator, Civil Service (Medical): No    Lack of Transportation (Non-Medical): No  Physical Activity: Inactive (12/24/2022)   Exercise Vital Sign    Days of Exercise per Week: 0 days    Minutes of Exercise per Session: 0 min  Stress: Stress Concern Present (12/24/2022)   [employer] of Occupational Health - Occupational Stress Questionnaire    Feeling of Stress : Rather much  Social Connections: Socially Isolated (12/24/2022)   Social Connection and  Isolation Panel [NHANES]    Frequency of Communication with Friends and Family: Once a week    Frequency of Social Gatherings with Friends and Family: Once a week    Attends Religious Services: Never    Database administrator or Organizations: No    Attends Banker Meetings: Never    Marital Status: Divorced  Catering manager Violence: Not At Risk (12/24/2022)   Humiliation, Afraid, Rape, and Kick questionnaire    Fear of Current or Ex-Partner: No    Emotionally Abused: No    Physically Abused: No    Sexually Abused: No    Family History:   The patient's family history includes ADD / ADHD in her son; Alcohol  abuse in her maternal grandfather; Anxiety disorder in her mother; Arthritis in her mother; Dementia in her father and mother; Depression in her mother; Heart disease in her brother and father; Hyperlipidemia in her father and mother; Hypertension in her father; Stroke in her father. There is no history of Bipolar disorder, Drug abuse, OCD, Paranoid behavior, Schizophrenia, Seizures, Sexual abuse, or Physical abuse.    ROS:   Review of Systems: [y] = yes, [ ]  = no      General: Weight gain [ ] ; Weight loss [ ] ; Anorexia [ ] ; Fatigue [ ] ; Fever [ ] ; Chills [ ] ; Weakness [ ]    Cardiac: Chest pain/pressure [ y]; Resting SOB [ ] ; Exertional SOB Davis.Dad ]; Orthopnea [ ] ; Pedal Edema [ ] ; Palpitations [ ] ; Syncope [ ] ; Presyncope [ ] ; Paroxysmal nocturnal dyspnea [ ]    Pulmonary: Cough [ ] ; Wheezing [ ] ; Hemoptysis [ ] ; Sputum [ ] ; Snoring [ ]    GI: Vomiting [ ] ; Dysphagia [ ] ; Melena [ ] ; Hematochezia [ ] ; Heartburn [ ] ; Abdominal pain [ ] ; Constipation [ ] ; Diarrhea [ ] ; BRBPR [ ]    GU: Hematuria [ ] ; Dysuria [ ] ; Nocturia [ ]  Vascular: Pain in legs with walking [ ] ; Pain in feet with lying flat [ ] ; Non-healing sores [ ] ; Stroke [ ] ; TIA [ ] ; Slurred speech [ ] ;   Neuro: Headaches [ ] ; Vertigo [ ] ; Seizures [ ] ; Paresthesias [ ] ;Blurred vision [ ] ; Diplopia [ ] ; Vision changes [ ]     Ortho/Skin: Arthritis [ ] ; Joint pain [ ] ; Muscle pain [ ] ; Joint swelling [ ] ; Back Pain [ ] ; Rash [ ]    Psych: Depression [ ] ; Anxiety [ ]    Heme: Bleeding problems [ ] ; Clotting disorders [ ] ; Anemia [ ]    Endocrine: Diabetes [ ] ; Thyroid  dysfunction [ ]    Physical Exam/Data:   Vitals:   10/25/23 0045 10/25/23 0115 10/25/23 0200 10/25/23 0215  BP: (!) 144/66 (!) 133/48 (!) 171/81 Aaron Aas)  147/65  Pulse: 74 72 80 78  Resp: 18 19 (!) 23 16  Temp:      TempSrc:      SpO2: 99% 98% 98% 98%  Weight:      Height:       No intake or output data in the 24 hours ending 10/25/23 0309    10/25/2023   12:32 AM 10/20/2023   10:41 AM 10/01/2023    8:51 AM  Last 3 Weights  Weight (lbs) 207 lb 207 lb 6.4 oz 205 lb  Weight (kg) 93.895 kg 94.076 kg 92.987 kg     Body mass index is 31.02 kg/m.  General:  Well nourished, well developed, in no acute distress HEENT: normal Lymph: no adenopathy Neck: no JVD Endocrine:  No thryomegaly Vascular: No carotid bruits; FA pulses 2+ bilaterally without bruits  Cardiac:  normal S1, S2; RRR; no murmur  Lungs:  clear to auscultation bilaterally, no wheezing, rhonchi or rales  Abd: soft, nontender, no hepatomegaly  Ext: 1+ edema Musculoskeletal:  No deformities, BUE and BLE strength normal and equal Skin: warm and dry  Neuro:  CNs 2-12 intact, no focal abnormalities noted Psych:  Normal affect   EKG:  The ECG that was done was personally reviewed and demonstrates diffuse ST depressions in II, III, aVF, V4-6  Relevant CV Studies: coronary calcium  score 07/19/2023 which showed a total of 1238 with calcium  distributed in all 3 coronary arteries.   A 2D echo revealed normal LV systolic function, mild concentric LVH with moderate aortic stenosis and a valve area of 1 cm.   Cardiac cath    Mid LM to Ost LAD lesion is 45% stenosed.   1st Diag lesion is 90% stenosed.   Mid LAD-1 lesion is 70% stenosed.   Mid LAD-2 lesion is 30% stenosed. Mid LAD to Dist LAD  lesion is 70% stenosed. Dist LAD lesion is 40% stenosed.   Prox Cx lesion is 80% stenosed (essentially the ostium of the AV groove portion of the Cx).   1st Mrg lesion is 90% stenosed.   .   Dist Cx lesion is 60% stenosed just prior to 2nd Mrg & 3rd Mrg lesion is 80% stenosed.   Prox RCA to Dist RCA lesion is 100% stenosed.  Retrograde flow from septal collaterals fills the PDA with retrograde flow to the occlusion site.   LV end diastolic pressure is low.  Laboratory Data:  High Sensitivity Troponin:   Recent Labs  Lab 10/25/23 0025 10/25/23 0220  TROPONINIHS 34* 45*      Chemistry Recent Labs  Lab 10/25/23 0025  NA 139  K 3.5  CL 103  CO2 26  GLUCOSE 145*  BUN 12  CREATININE 0.53  CALCIUM  9.0  GFRNONAA >60  ANIONGAP 10    No results for input(s): "PROT", "ALBUMIN", "AST", "ALT", "ALKPHOS", "BILITOT" in the last 168 hours. Hematology Recent Labs  Lab 10/25/23 0025  WBC 13.2*  RBC 3.42*  HGB 8.9*  HCT 31.4*  MCV 91.8  MCH 26.0  MCHC 28.3*  RDW 19.7*  PLT 338   BNPNo results for input(s): "BNP", "PROBNP" in the last 168 hours.  DDimer No results for input(s): "DDIMER" in the last 168 hours.  Radiology/Studies:  DG Chest Portable 1 View Result Date: 10/25/2023 EXAM: 1 VIEW(S) XRAY OF THE CHEST 10/25/2023 01:01:07 AM COMPARISON: 07/16/2023 CLINICAL HISTORY: Chest pain. FINDINGS: LUNGS AND PLEURA: Mild scarring in the right upper lobe. No consolidation. No pulmonary edema. No pleural effusion. No  pneumothorax. HEART AND MEDIASTINUM: No acute abnormality of the cardiac and mediastinal silhouettes. BONES AND SOFT TISSUES: No acute osseous abnormality. IMPRESSION: 1. No acute process. Electronically signed by: Zadie Herter MD 10/25/2023 01:10 AM EDT RP Workstation: [id number]      TIMI Risk Score for Unstable Angina or Non-ST Elevation MI:   The patient's TIMI risk score is 7, which indicates a 41% risk of all cause mortality, new or recurrent myocardial  infarction or need for urgent revascularization in the next 14 days.    Assessment and Plan:   Unstable Angina 3v CAD HLD Moderate aortic stenosis HTN Ms. Casasanta has known three-vessel coronary disease and was planned to be seen by CT surgery on 10/29/2023 for evaluation of outpatient CABG/SAVR, however at home has had worsening refractory atypical chest pain.  She does not think she can wait until her outpatient appointment due to her progressive chest pain.  In the emergency department, her troponins ruled out, but her EKG shows diffuse ST depressions in the inferior and lateral leads.  She would benefit from expedited CT surgery evaluation and heparinization to ensure  she does not have any occlusion of her critical coronary disease. - Aspirin  81 mg daily - Heparin  drip - CT surgery consult in a.m. - Continue home atorvastatin , ezetimibe , Imdur , lisinopril , hydrochlorothiazide , fenofibrate  - Sublingual nitro  PAD with Claudication Bilateral lower extremity claudication with Dopplers performed 09/22/2023 revealing right ABI of 0.53 and a left 0.59 with bilateral SFA disease.  - management on hold until after CABG and SAVR  COPD -no exacerbation watching closely  Polycythemia Vera Anemia GI occult positive stool, possible GI bleed She is on hydroxyuria for her polycythemia vera, however has  new anemia, and in the ED was found to be GI occult positive on her stool testing. Ddx GI bleed, bone marrow suppression 2/2 hydroxyurea , hemolysis with AS -Iron panel, haptoglobin, plasma free hemoglobin - Transfuse to Hgb > 8, however could likely benefit from transfusions to Hgb 10 given UA and active chestpin -Consider hematology consult in am, ? Bone marrow suppression -May need GI consult once UA stabilized  Chronic Pain -continue home percocet, avoid inpatient withdrawal  Depression -continue home bupropion   Peripheral Neuropathy -continue home duloxetine   Tobacco Use 100 pack  year smoking history, insists today is her last day smoking -smoking cessation counseling  DM2 -home home metformin , SSI  Atrial Fibrillation -hold home apixaban , heparin  gtt as above -continue home diltiazem   Obesity  Severity of Illness: The appropriate patient status for this patient is INPATIENT. Inpatient status is judged to be reasonable and necessary in order to provide the required intensity of service to ensure the patient's safety. The patient's presenting symptoms, physical exam findings, and initial radiographic and laboratory data in the context of their chronic comorbidities is felt to place them at high risk for further clinical deterioration. Furthermore, it is not anticipated that the patient will be medically stable for discharge from the hospital within 2 midnights of admission.   * I certify that at the point of admission it is my clinical judgment that the patient will require inpatient hospital care spanning beyond 2 midnights from the point of admission due to high intensity of service, high risk for further deterioration and high frequency of surveillance required.*   For questions or updates, please contact [facility] Please consult [email or web address] for contact info under     Signed, Chauncey Cora, MD  10/25/2023 3:09 AM

## 2023-10-25 NOTE — Progress Notes (Addendum)
 PHARMACY - ANTICOAGULATION CONSULT NOTE  Pharmacy Consult for heparin  Indication: chest pain/ACS  Patient Measurements: Height: 5' 8.5" (174 cm) Weight: 93.9 kg (207 lb) IBW/kg (Calculated) : 65.05 HEPARIN  DW (KG): 85.1  Vital Signs: Temp: 98.2 F (36.8 C) (04/28 0026) Temp Source: Oral (04/28 0026) BP: 147/65 (04/28 0215) Pulse Rate: 78 (04/28 0215)  Labs: Recent Labs    10/25/23 0025  HGB 8.9*  HCT 31.4*  PLT 338  CREATININE 0.53  TROPONINIHS 34*    Estimated Creatinine Clearance: 84.8 mL/min (by C-G formula based on SCr of 0.53 mg/dL).  Assessment: 39 yoF presented to ED with chest pain. PMH significant for moderate aortic stenosis, CAD (3 vessel disease-referred to Dr. Deloise Ferries for CABG/AVR), afib (eliquis ), HLD, HTN, and PAD. Pharmacy consulted to dose heparin  for ACS.  -Hgb 9 (13 on 4/4) >> hemoccult positive, but no obvious signs of melena, plts WNL -Trops 34 -Last dose of eliquis  4/27 AM  Goal of Therapy:  Heparin  level 0.3-0.7 units/ml aPTT 66-102 seconds Monitor platelets by anticoagulation protocol: Yes   Plan:  -No bolus given recent eliquis  administration -Start heparin  infusion at 1100 units/hr -6h aPTT and heparin  level -Monitor using aPTTs until correlates with heparin  level -CBC daily  Young Hensen, PharmD. Clinical Pharmacist 10/25/2023 2:55 AM

## 2023-10-25 NOTE — Progress Notes (Addendum)
 Patient Name: Katelyn Cline Date of Encounter: 10/25/2023 [facility] Cardiologist: Lauro Portal, MD   Interval Summary  .    Had a difficult night with episodes of chest pain, now resolved at this morning.  Vital Signs .    Vitals:   10/25/23 0415 10/25/23 0455 10/25/23 0600 10/25/23 0734  BP: (!) 186/79 (!) 152/62 (!) 165/72 (!) 176/65  Pulse: 75 74 67 75  Resp: (!) 26 (!) 22 (!) 24 (!) 24  Temp:  97.7 F (36.5 C)  99.4 F (37.4 C)  TempSrc:  Oral  Oral  SpO2: 100% 96% 100% 99%  Weight:      Height:       No intake or output data in the 24 hours ending 10/25/23 0839    10/25/2023   12:32 AM 10/20/2023   10:41 AM 10/01/2023    8:51 AM  Last 3 Weights  Weight (lbs) 207 lb 207 lb 6.4 oz 205 lb  Weight (kg) 93.895 kg 94.076 kg 92.987 kg      Telemetry/ECG    Atrial fibrillation with conversion to sinus rhythm- Personally Reviewed  Physical Exam .    GEN: No acute distress, on 3L [place]ck: No JVD Cardiac: RRR, + systolic murmur, no rubs, or gallops.  Respiratory: Expiratory wheezing bilaterally GI: Soft, nontender, non-distended  MS: No edema  Assessment & Plan .     63 y.o. female with a hx of 3v CAD, atrial fibrillation, COPD, 100 pack year smoking history, diabetes, HTN, HLD, PAD, moderate aortic stenosis was seen for the evaluation of unstable angina at the request of the ED.   Unstable Angina Multivessel CAD Moderate Aortic stenosis -- Recent outpatient cardiac catheterization 4/4 multivessel CAD w/ moderate aortic valve stenosis.  Pending outpatient CVTS consultation 5/2.  Presented back with chest pain that started yesterday morning, did have improvement with sublingual nitroglycerin .  But quick return of symptoms therefore presented to the ED. -- High-sensitivity troponin 34>>45, EKG with sinus rhythm, ST depression in inferior lateral leads. -- Received aspirin  in the ED and started on heparin  drip ( will hold for now as she is pain free  with her anemia and pending Hgb) -- Consult CVTS for possible inpatient evaluation -- Continue atorvastatin  80 mg daily, Zetia  10 mg daily, fenofibrate  160 mg daily, increase Imdur  to 60 mg daily  Anemia w/GI bleed Polycythemia Vera -- She is on hydroxyurea  for history of polycythemia vera, now presents with new anemia with positive occult stool.  Does report she has had some dark tarry stools intermittently over the past couple months.  She has seen GI in the past, colonoscopy recommended in 2023 but this was not completed. -- Hemoglobin 8.9, Iron 35, sat ratios 7  -- repeat Hgb this morning=> down to 8.3 on recheck; -> low threshold to transfuse if drops further. -- GI consult requested => while there is increased risk for GI procedures, GI procedures are inherently low risk from a cardiovascular standpoint, and do not put the heart in any increased risk above the risk of the anemia that is currently present.  We need ESA question about a potential bleeding nidus before she is given high dose of anticoagulation for CABG AVR but also to reinitiate her DOAC for A-fib.  Would recommend closely monitoring her blood pressures and oxygen  levels during the procedure, but there is no cardiac procedure that can change her risk.  COPD Chronic O2 dependence -- Mild wheezing on exam, chronically on 3  L She is indeed at high risk for CABG based on her underlying COPD and obesity as well as other comorbidities, however especially in light of her likely GI bleed on Eliquis  alone, she would not be a good candidate for antiplatelet agent plus DOAC for her multivessel disease which also would not allow for full revascularization of her extensive CAD.  Hypertension -- Elevated -- Continue diltiazem  120 mg daily, increase lisinopril  to 10mg  daily. Hold hydrochlorothiazide  for now.  Increase Imdur  to 60 mg daily  HLD -- continue statin  Paroxysmal atrial fibrillation -- Episodes of atrial fibrillation while in  the ED, currently in sinus rhythm -- Eliquis  held in the setting of GI bleeding, currently on IV heparin  but will hold for now pending Hgb trend -> with continued drop, will hold -- Continue home diltiazem   Peripheral neuropathy -- Continue home duloxetine    Depression -- Continue home bupropion   Diabetes -- Continue SSI  PAD with Claudication -- Bilateral lower extremity claudication with Dopplers performed 09/22/2023 revealing right ABI of 0.53 and a left 0.59 with bilateral SFA disease.  -- plans for further management after coronary disease/valve disease addressed   Tobacco use -- Cessation advised  For questions or updates, please contact  [facility] Please consult [email or web address] for contact info under        Signed, Johnie Nailer, NP   ATTENDING ATTESTATION  I have seen, examined and evaluated the patient this morning on rounds along with Johnie Nailer, NP-C.  After reviewing all the available data and chart, we discussed the patients laboratory, study & physical findings as well as symptoms in detail.  I agree with her findings, examination as well as impression recommendations as per our discussion.    Attending adjustments noted in italics.    Very difficult scenario made even more difficult by the presence of likely GI bleed with acute hemoglobin drop.  I suspect that her active anginal symptoms are probably related to demand ischemia from anemia.  Thankfully, she was pain-free upon our evaluation and has been maintaining for the most part sinus rhythm.  At this point we obligated to hold her Eliquis  with ongoing drop in hemoglobin.  This also makes her very poor candidate for potential PCI revascularization. We need to clarify source of bleed and potentially treated if possible with endoscopy prior to proceeding with revascularization via either CABG or PCI as both procedures require full anticoagulation and antiplatelet agent for PCI.  For now we will  continue to monitor titrate antihypertensive tensive/antianginals as necessary.  Continue high-dose statin Zetia  and fenofibrate  as well as Imdur  in addition to the standing dose of diltiazem  and lisinopril .  Agree with holding HCTZ which would allow us  to further titrate her other medications.    Arleen Lacer, MD, MS Randene Bustard, M.D., M.S. Interventional Cardiologist  [facility]  Pager # [phone] Phone # [phone] [address] [place], [place] [address]

## 2023-10-25 NOTE — Consult Note (Signed)
 Consultation Note   Referring Provider:  Cardiology PCP: Christel Cousins, MD Primary Gastroenterologist::  Lorella Roles, MD      Reason for Consultation:  Anemia, FOBT+ DOA: 10/25/2023         Hospital Day: 1   ASSESSMENT    63 y.o. year old female with a medical history including but not limited to HTN, moderate AS, DM, CAD, PAD, COPD, panic attacks, polycythemia vera, melanoma s/p RUL resection in 2014.  Admitted today with unstable angina.  Unstable angina  3v CAD Moderate AS Cardiothoracic Surgery feels she would benefit from CABG and AV replacement  New iron deficiency anemia Dark stools intermittently x 2 weeks FOBT+ Polycythemia vera Presenting hgb 8.3, down from baseline ~ 13. Rule out erosive disease, PUD, AVMs. Colon neoplasm seems unlikely cause given the dark stools and rapid drop in hgb.   COPD  Afib Home eliquis  is on hold  See PMH for additional history  Principal Problem:   Unstable angina ([name])   PLAN:   --For evaluation of anemia and dark Hemoccult positive stools patient needs an upper endoscopy.  She is clearly iron deficient so needs colonoscopy as well.  She is at increased risk for endoscopic procedures at this point. Discussing case with Cardiothoracic Surgery. For now, trend H/H and transfuse if needed.  --Adding Pantoprazole 40 mg IV BID  HPI   Catrece has been recently undergoing cardiac evaluation for shortness of breath.  She had a left cardiac cath earlier this month demonstrating severe multivessel CAD.  She was referred to cardiothoracic surgery and had an appointment to be seen next week but presented to the ED yesterday with severe chest pain.  Troponins were elevated and there were EKG changes.  She was started on heparin  but that was stopped due to anemia and reports of dark stools.  She reports dark sticky stools intermittently over the last couple of weeks.  She has no focal GI symptoms  such as abdominal pain, nausea, vomiting.  She has irregular bowel habits varying between constipation and normal stools.  She has not seen any blood in her stool.  She reports that her last colonoscopy was 30 years ago.  No family history of colon cancer.   Zula takes Hydrea  for polycythemia vera . Interestingly her hemoglobin was in the 13 range on the fourth of this month.  No source of any blood loss other than the dark sticky stools.  She takes a baby aspirin  daily but no other NSAIDs she has not had any vaginal bleeding, no hematuria.   Previous GI Studies   Last colonoscopy reportedly 30 years ago  Labs and Imaging:  Recent Labs    10/25/23 0025 10/25/23 1020  WBC 13.2* 11.3*  HGB 8.9* 8.3*  HCT 31.4* 29.6*  MCV 91.8 91.9  PLT 338 283   Recent Labs    10/25/23 0323  FERRITIN 13  TIBC 518*  IRONPCTSAT 7*   Recent Labs    10/25/23 0025  NA 139  K 3.5  CL 103  CO2 26  GLUCOSE 145*  BUN 12  CREATININE 0.53  CALCIUM  9.0   DG Chest Portable 1 View EXAM: 1 VIEW(S) XRAY OF THE CHEST 10/25/2023 01:01:07 AM  COMPARISON: 07/16/2023  CLINICAL HISTORY: Chest pain.  FINDINGS:  LUNGS AND PLEURA: Mild scarring in the right upper lobe. No consolidation. No pulmonary edema. No pleural effusion. No pneumothorax.  HEART AND MEDIASTINUM: No acute abnormality of the cardiac and mediastinal silhouettes.  BONES AND SOFT TISSUES: No acute osseous abnormality.  IMPRESSION: 1. No acute process.  Electronically signed by: Zadie Herter MD 10/25/2023 01:10 AM EDT RP Workstation: [id number]    Past Medical History:  Diagnosis Date   Agoraphobia    Anxiety    Arthritis    "all joints"   Chronic low back pain    Chronic respiratory failure with hypoxia ([name])    4L  via [place],  followed by pcp,   (09-16-2018  per pt only uses while at home and at night ,  portable oxygen  is not working)   Clotting disorder ([name]) 2013   Polycythemia vera   Colon polyps    COPD  (chronic obstructive pulmonary disease) ([name])    DDD (degenerative disc disease), lumbosacral    Dental caries    DM type 2 (diabetes mellitus, type 2) ([name])    followed by pcp   Fibromyalgia    GERD (gastroesophageal reflux disease)    occasional , no meds   Heart murmur 2020   History of basal cell carcinoma (BCC) excision    s/p  Moh's of face/ nose 12/ 2013   History of palpitations    per pt found to have occaional PVCs   History of UTI    HTN (hypertension)    Hyperlipidemia    Loose, teeth    Major depression    Metastatic melanoma ([name]) 10/18/2012   12 mm posterior right upper lobe pulmonary nodule, max SUV 3.0  s/p  right wedge resection 10-21-2012,  followed by oncologist-- dr Salomon Cree,  no recurrence   Mixed stress and urge urinary incontinence    Myeloproliferative neoplasm ([name])    Narcolepsy    Obesity    OSA (obstructive sleep apnea)    Oxygen  deficiency    Oxygen  dependent    4 L via [place],  per pt portable oxygen  not working but does use while at home and at night   Panic attacks    Periodontitis    Peripheral neuropathy    feet   Polycythemia vera(238.4) hematology/ oncology-- dr Salomon Cree ([facility])   first dx 02014 due to smoker/copd--  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)  hx phlebotomies   Pulmonary nodules    bilateral small stable per CT 03-11-2018   Scoliosis    Sleep apnea    Smokers' cough ([name])    per pt productive a little in the morning's   Symptoms of upper respiratory infection (URI) 03/12/2018   Urinary (tract) obstruction    Wears glasses     Past Surgical History:  Procedure Laterality Date   ABDOMINAL HYSTERECTOMY  1998   partial   BREAST LUMPECTOMY Bilateral 1995   benign per pt   CARDIOVERSION N/A 09/06/2023   Procedure: CARDIOVERSION;  Surgeon: Luana Rumple, MD;  Location: [facility] INVASIVE CV LAB;  Service: Cardiovascular;  Laterality: N/A;   COLONOSCOPY W/ BIOPSIES AND POLYPECTOMY     Hx: of   CYSTECTOMY   2000   abdominal wall    DILATION AND CURETTAGE OF UTERUS  yrs ago   MOHS SURGERY  2013   nose/face   MULTIPLE EXTRACTIONS WITH ALVEOLOPLASTY N/A 09/19/2018   Procedure: Extraction of tooth #'s 2, 3, 6-14, 18, and  20-30 with alveoloplasty;  Surgeon: Sehar Chroman, DDS;  Location: [facility] ORS;  Service: Oral Surgery;  Laterality: N/A;  GENERAL WITH NASAL TUBE   RIGHT/LEFT HEART CATH AND CORONARY ANGIOGRAPHY N/A 10/01/2023   Procedure: RIGHT/LEFT HEART CATH AND CORONARY ANGIOGRAPHY;  Surgeon: Arleen Lacer, MD;  Location: [facility] INVASIVE CV LAB;  Service: Cardiovascular;  Laterality: N/A;   VIDEO ASSISTED THORACOSCOPY (VATS)/WEDGE RESECTION Right 10/21/2012   Procedure: VIDEO ASSISTED THORACOSCOPY (VATS)/WEDGE RESECTION;  Surgeon: Norita Beauvais, MD;  Location: [facility] OR;  Service: Thoracic;  Laterality: Right;   VIDEO BRONCHOSCOPY N/A 10/21/2012   Procedure: VIDEO BRONCHOSCOPY;  Surgeon: Norita Beauvais, MD;  Location: [facility] OR;  Service: Thoracic;  Laterality: N/A;    Family History  Problem Relation Age of Onset   Arthritis Mother    Hyperlipidemia Mother    Depression Mother    Anxiety disorder Mother    Dementia Mother    Hypertension Father    Hyperlipidemia Father    Heart disease Father    Stroke Father    Dementia Father    Heart disease Brother    ADD / ADHD Son    Alcohol  abuse Maternal Grandfather    Bipolar disorder Neg Hx    Drug abuse Neg Hx    OCD Neg Hx    Paranoid behavior Neg Hx    Schizophrenia Neg Hx    Seizures Neg Hx    Sexual abuse Neg Hx    Physical abuse Neg Hx     Prior to Admission medications   Medication Sig Start Date End Date Taking? Authorizing Provider  albuterol  (VENTOLIN  HFA) 108 (90 Base) MCG/ACT inhaler Inhale 2 puffs into the lungs every 6 (six) hours as needed for wheezing or shortness of breath (as needed). 06/18/23  Yes Christel Cousins, MD  aspirin  EC 81 MG tablet Take 1 tablet (81 mg total) by mouth daily. Swallow whole. 11/17/22  Yes  Christel Cousins, MD  atorvastatin  (LIPITOR) 80 MG tablet Take 1 tablet by mouth daily at bedtime. 10/06/23  Yes Christel Cousins, MD  buPROPion  ER (WELLBUTRIN  SR) 100 MG 12 hr tablet Take 1 tablet by mouth every morning and take 1 tablet by mouth every day at bedtime. 10/06/23  Yes Christel Cousins, MD  diltiazem  (CARDIZEM  CD) 120 MG 24 hr capsule Take 1 capsule (120 mg total) by mouth daily. Patient taking differently: Take 120 mg by mouth at bedtime. 09/07/23  Yes Christel Cousins, MD  DULoxetine  (CYMBALTA ) 60 MG capsule Take 1 capsule by mouth twice daily. 08/07/23  Yes Christel Cousins, MD  ELIQUIS  5 MG TABS tablet Take 1 tablet (5 mg total) by mouth 2 (two) times daily. 09/07/23  Yes Christel Cousins, MD  ezetimibe  (ZETIA ) 10 MG tablet Take 1 tablet by mouth every morning. 10/06/23  Yes Christel Cousins, MD  fenofibrate  (TRICOR ) 145 MG tablet Take 1 tablet by mouth daily at bedtime. 10/06/23  Yes Christel Cousins, MD  fish oil-omega-3 fatty acids  1000 MG capsule Take 2 capsules (2 g total) by mouth 2 (two) times daily. 11/17/22  Yes Christel Cousins, MD  furosemide  (LASIX ) 20 MG tablet Take 1 tablet (20 mg total) by mouth daily as needed. 04/07/23  Yes Christel Cousins, MD  hydrochlorothiazide  (HYDRODIURIL ) 25 MG tablet Take 1 tablet by mouth every morning. 10/06/23  Yes Christel Cousins, MD  hydroxyurea  (HYDREA ) 500 MG capsule Take 2 capsules by mouth daily at bedtime with an  additional 1 capsule on Monday and Thursday. Patient taking differently: Take 1,000-1,500 mg by mouth See admin instructions. Take 2 capsules at daily at bedtime with 1 additional capsule on Mon, Wed, Fri nigtht 10/06/23  Yes Frankie Israel, MD  isosorbide  mononitrate (IMDUR ) 30 MG 24 hr tablet Take 1 tablet (30 mg total) by mouth at bedtime. 10/02/23 11/01/23 Yes Arleen Lacer, MD  lisinopril  (ZESTRIL ) 5 MG tablet Take 1 tablet by mouth daily at bedtime. 10/06/23  Yes Christel Cousins, MD  metFORMIN  (GLUCOPHAGE ) 1000 MG tablet Take 1  tablet by mouth every morning and take 1 tablet by mouth every day at bedtime. 10/06/23  Yes Christel Cousins, MD  nitroGLYCERIN  (NITROSTAT ) 0.4 MG SL tablet Place 1 tablet (0.4 mg total) under the tongue every 5 (five) minutes as needed for chest pain (up to 3 doses. If taking 3rd dose call 911). 10/02/23  Yes Arleen Lacer, MD  nystatin  (MYCOSTATIN /NYSTOP ) powder Apply 1 Application topically 3 (three) times daily. PA Case: [id number] Approved 06/30/2019- 06/28/2020 Patient taking differently: Apply 1 Application topically 3 (three) times daily as needed (irritation). PA Case: [id number] Approved 06/30/2019- 06/28/2020 05/18/22  Yes Christel Cousins, MD  nystatin  cream (MYCOSTATIN ) Apply topically twice daily. Patient taking differently: Apply 1 Application topically 2 (two) times daily as needed for dry skin. 07/11/23  Yes Christel Cousins, MD  oxyCODONE -acetaminophen  (PERCOCET) 10-325 MG tablet Take 1 tablet by mouth every 4 (four) hours as needed for severe pain (pain score 7-10). 07/18/18  Yes , Marvell Slider, MD  potassium chloride  SA (KLOR-CON  M) 20 MEQ tablet Take 1 tablet (20 mEq total) by mouth daily as needed. Take if take furosemide  04/07/23  Yes Christel Cousins, MD  senna (SENOKOT) 8.6 MG TABS tablet Take 2 tablets by mouth daily as needed for mild constipation.   Yes [provider]  Vitamin D , Ergocalciferol , (DRISDOL ) 1.25 MG (50000 UNIT) CAPS capsule Take 1 capsule by mouth on Sundays at bedtime. 09/07/23  Yes Christel Cousins, MD  Blood Glucose Monitoring Suppl (BLOOD GLUCOSE SYSTEM PAK) KIT Please dispense based on patient and insurance preference. Use as directed to monitor FSBS 2x daily. Dx: E11.9 10/18/19   Mathis Som, MD  glucose blood (ACCU-CHEK GUIDE) test strip Use as instructed 04/27/22   Christel Cousins, MD  Glucose Blood (BLOOD GLUCOSE TEST STRIPS) STRP Please dispense based on patient and insurance preference. Use as directed to monitor FSBS 2x daily. Dx: E11.9  10/18/19   Mathis Som, MD  Microlet Lancets MISC Please dispense based on patient and insurance preference. Use as directed to monitor FSBS 2x daily. Dx: E11.9 10/18/19   Mathis Som, MD    Current Facility-Administered Medications  Medication Dose Route Frequency Provider Last Rate Last Admin   [START ON 10/26/2023] aspirin  EC tablet 81 mg  81 mg Oral Daily Andreae, Andrew E, MD       atorvastatin  (LIPITOR) tablet 80 mg  80 mg Oral QHS Andreae, Andrew E, MD       buPROPion  ER (WELLBUTRIN  SR) 12 hr tablet 100 mg  100 mg Oral BID Andreae, Andrew E, MD   100 mg at 10/25/23 1015   diltiazem  (CARDIZEM  CD) 24 hr capsule 120 mg  120 mg Oral QHS Andreae, Andrew E, MD       DULoxetine  (CYMBALTA ) DR capsule 60 mg  60 mg Oral BID Andreae, Andrew E, MD   60 mg at 10/25/23 0801   ezetimibe  (  ZETIA ) tablet 10 mg  10 mg Oral q morning Chauncey Cora, MD   10 mg at 10/25/23 0800   fenofibrate  tablet 160 mg  160 mg Oral Daily Chauncey Cora, MD   160 mg at 10/25/23 1015   [START ON 10/26/2023] hydroxyurea  (HYDREA ) capsule 1,000 mg  1,000 mg Oral Once per day on Sunday Tuesday Thursday Saturday Chauncey Cora, MD       And   hydroxyurea  (HYDREA ) capsule 1,500 mg  1,500 mg Oral Once per day on Monday Wednesday Friday Chauncey Cora, MD       insulin  aspart (novoLOG ) injection 0-15 Units  0-15 Units Subcutaneous TID WC Chauncey Cora, MD   2 Units at 10/25/23 1157   insulin  aspart (novoLOG ) injection 0-5 Units  0-5 Units Subcutaneous QHS Chauncey Cora, MD       isosorbide  mononitrate (IMDUR ) 24 hr tablet 60 mg  60 mg Oral QHS Johnie Nailer B, NP       lisinopril  (ZESTRIL ) tablet 10 mg  10 mg Oral QHS Johnie Nailer B, NP       nitroGLYCERIN  (NITROSTAT ) SL tablet 0.4 mg  0.4 mg Sublingual Q5 min PRN Chauncey Cora, MD   0.4 mg at 10/25/23 0320   ondansetron  (ZOFRAN ) injection 4 mg  4 mg Intravenous Q6H PRN Chauncey Cora, MD       oxyCODONE -acetaminophen  (PERCOCET/ROXICET)  5-325 MG per tablet 1 tablet  1 tablet Oral Q4H PRN Young Hensen, RPH   1 tablet at 10/25/23 1014   And   oxyCODONE  (Oxy IR/ROXICODONE ) immediate release tablet 5 mg  5 mg Oral Q4H PRN Young Hensen, RPH   5 mg at 10/25/23 1014    Allergies as of 10/25/2023 - Review Complete 10/25/2023  Allergen Reaction Noted   Contrast media [iodinated contrast media] Anaphylaxis 04/08/2012   Erythromycin  10/25/2013   Flagyl [metronidazole] Other (See Comments) 02/16/2012   Latex Other (See Comments) 10/15/2013   Other  04/09/2014   Penicillins Other (See Comments) 02/16/2012   Tetracyclines & related Other (See Comments) 02/16/2012    Social History   Socioeconomic History   Marital status: Divorced    Spouse name: Not on file   Number of children: 1   Years of education: 12+   Highest education level: Not on file  Occupational History   Occupation: disabled  Tobacco Use   Smoking status: Every Day    Current packs/day: 0.50    Average packs/day: 0.5 packs/day for 50.0 years (25.0 ttl pk-yrs)    Types: Cigarettes   Smokeless tobacco: Never   Tobacco comments:    Current smoker 10 cigarettes daily 06/10/23  Vaping Use   Vaping status: Former   Quit date: 09/15/2016  Substance and Sexual Activity   Alcohol  use: Yes    Alcohol /week: 1.0 standard drink of alcohol     Types: 1 Standard drinks or equivalent per week    Comment: Not weekly   Drug use: Never   Sexual activity: Not Currently    Birth control/protection: Surgical  Other Topics Concern   Not on file  Social History Narrative   Son-lives w/son.   disabled   Social Drivers of Corporate investment banker Strain: Low Risk  (12/24/2022)   Overall Financial Resource Strain (CARDIA)    Difficulty of Paying Living Expenses: Not hard at all  Food Insecurity: No Food Insecurity (12/24/2022)   Hunger Vital Sign    Worried About Running Out of Food  in the Last Year: Never true    Ran Out of Food in the Last Year: Never true   Transportation Needs: No Transportation Needs (12/24/2022)   PRAPARE - Administrator, Civil Service (Medical): No    Lack of Transportation (Non-Medical): No  Physical Activity: Inactive (12/24/2022)   Exercise Vital Sign    Days of Exercise per Week: 0 days    Minutes of Exercise per Session: 0 min  Stress: Stress Concern Present (12/24/2022)   [employer] of Occupational Health - Occupational Stress Questionnaire    Feeling of Stress : Rather much  Social Connections: Socially Isolated (12/24/2022)   Social Connection and Isolation Panel [NHANES]    Frequency of Communication with Friends and Family: Once a week    Frequency of Social Gatherings with Friends and Family: Once a week    Attends Religious Services: Never    Database administrator or Organizations: No    Attends Banker Meetings: Never    Marital Status: Divorced  Catering manager Violence: Not At Risk (12/24/2022)   Humiliation, Afraid, Rape, and Kick questionnaire    Fear of Current or Ex-Partner: No    Emotionally Abused: No    Physically Abused: No    Sexually Abused: No     Code Status   Code Status: Full Code  Review of Systems: All systems reviewed and negative except where noted in HPI.  Physical Exam: Vital signs in last 24 hours: Temp:  [97.7 F (36.5 C)-99.4 F (37.4 C)] 97.7 F (36.5 C) (04/28 1500) Pulse Rate:  [67-83] 72 (04/28 1500) Resp:  [16-26] 20 (04/28 1500) BP: (133-186)/(48-81) 151/52 (04/28 1500) SpO2:  [94 %-100 %] 97 % (04/28 1500) Weight:  [93.9 kg] 93.9 kg (04/28 0032)    General:  Pleasant female in NAD Psych:  Cooperative. Normal mood and affect Eyes: Pupils equal Ears:  Normal auditory acuity Nose: No deformity, discharge or lesions Neck:  Supple, no masses felt Lungs:  Clear to auscultation.  Heart:  Regular rate, regular rhythm, + Murmur.  Abdomen:  Soft, nondistended, nontender, active bowel sounds, no masses felt Rectal :   Deferred Msk: Symmetrical without gross deformities.  Neurologic:  Alert, oriented, grossly normal neurologically Skin:  Intact without significant lesions.    Intake/Output from previous day: No intake/output data recorded. Intake/Output this shift:  No intake/output data recorded.   Mai Schwalbe, NP-C   10/25/2023, 4:38 PM

## 2023-10-25 NOTE — ED Triage Notes (Signed)
 Pt arrived with [facility] for chest pain that started around 6pm; left sided radiating down arm with increased sob that worsens on exertion. Baseline 3L home oxygen , scheduled for consultation with Dr.Lightfoot on 5/2 for CABG. Between 6p and time she called EMS took nitroglycerin  x 3 with 324mg  asa. Pain would decrease with each nitroglycerin . 20g right forearm

## 2023-10-25 NOTE — ED Provider Notes (Signed)
 [place] EMERGENCY DEPARTMENT AT [facility] Provider Note   CSN: [id number] Arrival date & time: 10/25/23  0019     History  Chief Complaint  Patient presents with   Chest Pain    Katelyn Cline is a 62 y.o. female.  The history is provided by the patient.  Patient with extensive history including COPD, chronic respiratory failure on oxygen , CAD, atrial fibrillation on anticoagulation presents with chest pain.  Patient reports around dinnertime she was at a [place] buffet when she started having chest pain and shortness of breath that appeared to be worsened with exertion.  She has taken nitroglycerin  with some improvement.  She had intermittent episodes throughout the evening and called 911 and was brought to the hospital.  She has had aspirin  and pain is down around 1 out of 10.  She reports at times the chest pain did radiate to her left arm    Past Medical History:  Diagnosis Date   Agoraphobia    Anxiety    Arthritis    "all joints"   Chronic low back pain    Chronic respiratory failure with hypoxia ([name])    4L  via [place],  followed by pcp,   (09-16-2018  per pt only uses while at home and at night ,  portable oxygen  is not working)   Clotting disorder ([name]) 2013   Polycythemia vera   Colon polyps    COPD (chronic obstructive pulmonary disease) ([name])    DDD (degenerative disc disease), lumbosacral    Dental caries    DM type 2 (diabetes mellitus, type 2) ([name])    followed by pcp   Fibromyalgia    GERD (gastroesophageal reflux disease)    occasional , no meds   Heart murmur 2020   History of basal cell carcinoma (BCC) excision    s/p  Moh's of face/ nose 12/ 2013   History of palpitations    per pt found to have occaional PVCs   History of UTI    HTN (hypertension)    Hyperlipidemia    Loose, teeth    Major depression    Metastatic melanoma ([name]) 10/18/2012   12 mm posterior right upper lobe pulmonary nodule, max SUV 3.0  s/p  right wedge resection  10-21-2012,  followed by oncologist-- dr Salomon Cree,  no recurrence   Mixed stress and urge urinary incontinence    Myeloproliferative neoplasm ([name])    Narcolepsy    Obesity    OSA (obstructive sleep apnea)    Oxygen  deficiency    Oxygen  dependent    4 L via [place],  per pt portable oxygen  not working but does use while at home and at night   Panic attacks    Periodontitis    Peripheral neuropathy    feet   Polycythemia vera(238.4) hematology/ oncology-- dr Salomon Cree ([facility])   first dx 02014 due to smoker/copd--  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 Authorizing Provider  acetaminophen  (TYLENOL ) 500 MG tablet Take 1,000 mg by mouth every 8 (  eight) hours as needed for moderate pain (pain score 4-6).   Yes [provider]  albuterol  (VENTOLIN  HFA) 108 (90 Base) MCG/ACT inhaler Inhale 2 puffs into the lungs every 6 (six) hours as needed for wheezing or shortness of breath (as needed). 06/18/23  Yes Christel Cousins, MD  aspirin  EC 81 MG tablet Take 1 tablet (81 mg total) by mouth daily. Swallow whole. 11/17/22  Yes Christel Cousins, MD  atorvastatin  (LIPITOR) 80 MG tablet Take 1 tablet by mouth daily at bedtime. 10/06/23  Yes Christel Cousins, MD  buPROPion  ER (WELLBUTRIN  SR) 100 MG 12 hr tablet Take 1 tablet by mouth every morning and take 1 tablet by mouth every day at bedtime. 10/06/23  Yes Christel Cousins, MD  diltiazem  (CARDIZEM  CD) 120 MG 24 hr capsule Take 1 capsule (120 mg total) by mouth daily. Patient taking differently: Take 120 mg by mouth at bedtime. 09/07/23  Yes Christel Cousins, MD  DULoxetine  (CYMBALTA ) 60 MG capsule Take 1 capsule by mouth twice daily. 08/07/23  Yes Christel Cousins, MD  ELIQUIS  5 MG TABS tablet Take 1 tablet (5 mg total) by mouth 2 (two) times daily. 09/07/23  Yes Christel Cousins, MD  ezetimibe  (ZETIA ) 10 MG tablet Take 1 tablet by mouth every morning. 10/06/23  Yes Christel Cousins, MD  fenofibrate  (TRICOR ) 145 MG tablet Take 1 tablet by mouth daily at bedtime. 10/06/23  Yes Christel Cousins, MD  fish oil-omega-3 fatty acids  1000 MG capsule Take 2 capsules (2 g total) by mouth 2 (two) times daily. 11/17/22  Yes Christel Cousins, MD  furosemide  (LASIX ) 20 MG tablet Take 1 tablet (20 mg total) by mouth daily as needed. 04/07/23  Yes Christel Cousins, MD  hydrochlorothiazide  (HYDRODIURIL ) 25 MG tablet Take 1 tablet by mouth every morning. 10/06/23  Yes Christel Cousins, MD  hydroxyurea  (HYDREA ) 500 MG capsule Take 2 capsules by mouth daily at bedtime with an additional 1 capsule on Monday and Thursday. 10/06/23  Yes Frankie Israel, MD  isosorbide  mononitrate (IMDUR ) 30 MG 24 hr tablet Take 1 tablet (30 mg total) by mouth at bedtime. 10/02/23 11/01/23 Yes Arleen Lacer, MD  lisinopril  (ZESTRIL ) 5 MG tablet Take 1 tablet by mouth daily at bedtime. 10/06/23  Yes Christel Cousins, MD  metFORMIN  (GLUCOPHAGE ) 1000 MG tablet Take 1 tablet by mouth every morning and take 1 tablet by mouth every day at bedtime. 10/06/23  Yes Christel Cousins, MD  nitroGLYCERIN  (NITROSTAT ) 0.4 MG SL tablet Place 1 tablet (0.4 mg total) under the tongue every 5 (five) minutes as needed for chest pain (up to 3 doses. If taking 3rd dose call 911). 10/02/23  Yes Arleen Lacer, MD  nystatin  (MYCOSTATIN /NYSTOP ) powder Apply 1 Application topically 3 (three) times daily. PA Case: [id number] Approved 06/30/2019- 06/28/2020 Patient taking differently: Apply 1 Application topically 3 (three) times daily as needed (irritation). PA Case: [id number] Approved 06/30/2019- 06/28/2020 05/18/22  Yes Christel Cousins, MD  nystatin  cream (MYCOSTATIN ) Apply topically twice daily. Patient taking differently: Apply 1 Application topically 2 (two) times daily as needed for dry skin. 07/11/23  Yes Christel Cousins, MD  oxyCODONE -acetaminophen  (PERCOCET) 10-325 MG tablet Take 1 tablet by mouth every 4 (four) hours as needed for severe pain (pain score 7-10). 07/18/18  Yes Hartshorne, Marvell Slider, MD  potassium chloride  SA (KLOR-CON  M) 20 MEQ tablet Take 1 tablet (20 mEq total) by mouth daily as needed.  Take if take furosemide  04/07/23  Yes Christel Cousins, MD  senna (SENOKOT) 8.6 MG TABS tablet Take 2 tablets by mouth daily as needed for mild constipation.   Yes [provider]  Vitamin D , Ergocalciferol , (DRISDOL ) 1.25 MG (50000 UNIT) CAPS capsule Take 1 capsule by mouth on Sundays at bedtime. 09/07/23  Yes Christel Cousins, MD  Blood Glucose Monitoring Suppl (BLOOD GLUCOSE SYSTEM PAK) KIT Please dispense based on patient and insurance preference. Use as directed to monitor FSBS 2x daily. Dx: E11.9 10/18/19   Mathis Som, MD  glucose blood (ACCU-CHEK GUIDE) test strip Use as instructed 04/27/22   Christel Cousins, MD  Glucose Blood (BLOOD GLUCOSE TEST STRIPS) STRP Please dispense based on patient and insurance preference. Use as directed to monitor FSBS 2x daily. Dx: E11.9 10/18/19   Mathis Som, MD  Microlet Lancets MISC Please dispense based on patient and insurance preference. Use as directed to monitor FSBS 2x daily. Dx: E11.9 10/18/19   Mathis Som, MD      Allergies    Contrast media [iodinated contrast media], Erythromycin, Flagyl [metronidazole], Latex, Other, Penicillins, and Tetracyclines & related    Review of Systems   Review of Systems  Constitutional:  Negative for fever.  Respiratory:  Positive for shortness of breath.   Cardiovascular:  Positive for chest pain.  Gastrointestinal:  Negative for blood in stool and vomiting.  Genitourinary:  Negative for vaginal bleeding.     Physical Exam Updated Vital Signs BP (!) 163/69   Pulse 78   Temp 98.2 F (36.8 C) (Oral)   Resp 18   Ht 1.74 m (5' 8.5")   Wt 93.9 kg   SpO2 97%   BMI 31.02 kg/m  Physical Exam CONSTITUTIONAL: Chronically ill-appearing, no acute distress HEAD: Normocephalic/atraumatic EYES: EOMI/PERRL ENMT: Mucous membranes moist NECK: supple no meningeal signs CV: S1/S2 noted, no murmur noted LUNGS: Lungs are clear to auscultation bilaterally, no apparent distress ABDOMEN: soft, obese Rectal-performed w/nurse present, no blood or melena NEURO: Pt is awake/alert/appropriate, moves all extremitiesx4.  No facial droop.   EXTREMITIES: pulses normal/equal, full ROM SKIN: warm, color normal PSYCH: no abnormalities of mood noted, alert and oriented to situation  ED Results / Procedures / Treatments   Labs (all labs ordered are listed, but only abnormal results are displayed) Labs Reviewed  BASIC METABOLIC PANEL WITH GFR - Abnormal; Notable for the following components:      Result Value   Glucose, Bld 145 (*)    All other components within normal limits  CBC - Abnormal; Notable for the following components:   WBC 13.2 (*)    RBC 3.42 (*)    Hemoglobin 8.9 (*)    HCT 31.4 (*)    MCHC 28.3 (*)    RDW 19.7 (*)    nRBC 0.3 (*)    All other components within normal limits  POC OCCULT BLOOD, ED - Abnormal; Notable for the following components:   Fecal Occult Bld POSITIVE (*)    All other components within normal limits  TROPONIN I (HIGH SENSITIVITY) - Abnormal; Notable for the following components:   Troponin I (High Sensitivity) 34 (*)    All other components within normal limits  TYPE AND SCREEN  TROPONIN I (HIGH SENSITIVITY)    EKG EKG Interpretation Date/Time:  Monday October 25 2023 00:25:23 EDT Ventricular Rate:  81 PR Interval:  164 QRS Duration:  102 QT Interval:  405 QTC Calculation: 471 R Axis:  62  Text Interpretation: Sinus rhythm Repol abnrm suggests ischemia,  diffuse leads Minimal ST elevation, anterior leads Confirmed by Eldon Greenland ([id number]) on 10/25/2023 12:29:03 AM  Radiology DG Chest Portable 1 View Result Date: 10/25/2023 EXAM: 1 VIEW(S) XRAY OF THE CHEST 10/25/2023 01:01:07 AM COMPARISON: 07/16/2023 CLINICAL HISTORY: Chest pain. FINDINGS: LUNGS AND PLEURA: Mild scarring in the right upper lobe. No consolidation. No pulmonary edema. No pleural effusion. No pneumothorax. HEART AND MEDIASTINUM: No acute abnormality of the cardiac and mediastinal silhouettes. BONES AND SOFT TISSUES: No acute osseous abnormality. IMPRESSION: 1. No acute process. Electronically signed by: Zadie Herter MD 10/25/2023 01:10 AM EDT RP Workstation: [id number]    Procedures .Critical Care  Performed by: Eldon Greenland, MD Authorized by: Eldon Greenland, MD   Critical care provider statement:    Critical care time (minutes):  75   Critical care start time:  10/25/2023 12:30 AM   Critical care end time:  10/25/2023 1:55 AM   Critical care time was exclusive of:  Separately billable procedures and treating other patients   Critical care was necessary to treat or prevent imminent or life-threatening deterioration of the following conditions:  Cardiac failure and respiratory failure   Critical care was time spent personally by me on the following activities:  Obtaining history from patient or surrogate, examination of patient, development of treatment plan with patient or surrogate, pulse oximetry, ordering and review of radiographic studies, ordering and review of laboratory studies, re-evaluation of patient's condition, review of old charts and evaluation of patient's response to treatment   I assumed direction of critical care for this patient from another provider in my specialty: no     Care discussed with: admitting provider       Medications Ordered in ED Medications  nitroGLYCERIN  (NITROSTAT ) SL tablet 0.4 mg (0.4 mg Sublingual Given 10/25/23 0206)    ED  Course/ Medical Decision Making/ A&P Clinical Course as of 10/25/23 0218  Mon Oct 25, 2023  0124 Patient presents with chest pain and shortness of breath that is improving.  Patient just had a cardiac catheterization earlier this month with diffuse CAD has been referred to CT surgery for valve repair as well as CABG Patient with abnormal EKG and mildly elevated troponin.  Will consult cardiology [DW]  0215 Patient found to have new anemia.  Patient actually has history of polycythemia vera that is previously required phlebotomy She had a recent increase in her hydroxyurea .  She does not recall any bloody or black stools, denies any vomiting, denies any vaginal bleeding.  Rectal exam performed with nurse present reveals brownish stool but Hemoccult positive.  There was no blood noted She is on anticoagulation.  It is possible her new anemia is related to the hydroxyurea  increase though there could be a slow GI bleed.  Will hold Eliquis  for now  Nitroglycerin  has been ordered for recurrent chest pain   [DW]  0216 Discussed with Dr. Leandrew Proctor with cardiology he will see patient [DW]    Clinical Course User Index [DW] Eldon Greenland, MD                                 Medical Decision Making Amount and/or Complexity of Data Reviewed Labs: ordered. Radiology: ordered.  Risk Prescription drug management. Decision regarding hospitalization.   This patient presents to the ED for concern of chest pain, this involves an extensive number of treatment options, and is a complaint  that carries with it a high risk of complications and morbidity.  The differential diagnosis includes but is not limited to acute coronary syndrome, aortic dissection, pulmonary embolism, pericarditis, pneumothorax, pneumonia, myocarditis, pleurisy, esophageal rupture   Comorbidities that complicate the patient evaluation: Patient's presentation is complicated by their history of CAD, COPD  Social Determinants of  Health: Patient's  tobacco use   increases the complexity of managing their presentation  Additional history obtained: Records reviewed previous admission documents  Lab Tests: I Ordered, and personally interpreted labs.  The pertinent results include: Anemia, elevated troponin  Imaging Studies ordered: I ordered imaging studies including X-ray chest   I independently visualized and interpreted imaging which showed no acute findings I agree with the radiologist interpretation  Cardiac Monitoring: The patient was maintained on a cardiac monitor.  I personally viewed and interpreted the cardiac monitor which showed an underlying rhythm of:  sinus rhythm  Medications given include nitroglycerin  for active chest pain  Critical Interventions:   admission and cardiology consultation  Consultations Obtained: I requested consultation with the consultant cardiology , and discussed  findings as well as pertinent plan - they recommend: Will admit  Reevaluation: After the interventions noted above, I reevaluated the patient and found that they have :stayed the same  Complexity of problems addressed: Patient's presentation is most consistent with  acute presentation with potential threat to life or bodily function  Disposition: After consideration of the diagnostic results and the patient's response to treatment,  I feel that the patent would benefit from admission   .           Final Clinical Impression(s) / ED Diagnoses Final diagnoses:  Unstable angina [facility])    Rx / DC Orders ED Discharge Orders     None         Eldon Greenland, MD 10/25/23 [phone]

## 2023-10-25 NOTE — ED Notes (Signed)
 Pt c/o chest pain and up into her lt neck

## 2023-10-25 NOTE — ED Notes (Signed)
 CCMD called and patient placed on monitor

## 2023-10-26 ENCOUNTER — Inpatient Hospital Stay (HOSPITAL_COMMUNITY)

## 2023-10-26 DIAGNOSIS — J42 Unspecified chronic bronchitis: Secondary | ICD-10-CM | POA: Diagnosis not present

## 2023-10-26 DIAGNOSIS — Z01810 Encounter for preprocedural cardiovascular examination: Secondary | ICD-10-CM | POA: Diagnosis not present

## 2023-10-26 DIAGNOSIS — J9611 Chronic respiratory failure with hypoxia: Secondary | ICD-10-CM | POA: Diagnosis not present

## 2023-10-26 DIAGNOSIS — I200 Unstable angina: Secondary | ICD-10-CM | POA: Diagnosis not present

## 2023-10-26 DIAGNOSIS — D649 Anemia, unspecified: Secondary | ICD-10-CM | POA: Diagnosis not present

## 2023-10-26 LAB — CBC
HCT: 33.4 % — ABNORMAL LOW (ref 36.0–46.0)
Hemoglobin: 9.7 g/dL — ABNORMAL LOW (ref 12.0–15.0)
MCH: 26.2 pg (ref 26.0–34.0)
MCHC: 29 g/dL — ABNORMAL LOW (ref 30.0–36.0)
MCV: 90.3 fL (ref 80.0–100.0)
Platelets: 367 10*3/uL (ref 150–400)
RBC: 3.7 MIL/uL — ABNORMAL LOW (ref 3.87–5.11)
RDW: 19.6 % — ABNORMAL HIGH (ref 11.5–15.5)
WBC: 13 10*3/uL — ABNORMAL HIGH (ref 4.0–10.5)
nRBC: 0.2 % (ref 0.0–0.2)

## 2023-10-26 LAB — IRON AND TIBC
Iron: 30 ug/dL (ref 28–170)
Saturation Ratios: 6 % — ABNORMAL LOW (ref 10.4–31.8)
TIBC: 521 ug/dL — ABNORMAL HIGH (ref 250–450)
UIBC: 491 ug/dL

## 2023-10-26 LAB — PULMONARY FUNCTION TEST
FEF 25-75 Pre: 0.77 L/s
FEF2575-%Pred-Pre: 31 %
FEV1-%Pred-Pre: 41 %
FEV1-Pre: 1.2 L
FEV1FVC-%Pred-Pre: 86 %
FEV6-%Pred-Pre: 49 %
FEV6-Pre: 1.76 L
FEV6FVC-%Pred-Pre: 101 %
FVC-%Pred-Pre: 48 %
FVC-Pre: 1.81 L
Pre FEV1/FVC ratio: 66 %
Pre FEV6/FVC Ratio: 98 %

## 2023-10-26 LAB — BASIC METABOLIC PANEL WITH GFR
Anion gap: 8 (ref 5–15)
BUN: 17 mg/dL (ref 8–23)
CO2: 29 mmol/L (ref 22–32)
Calcium: 9.2 mg/dL (ref 8.9–10.3)
Chloride: 101 mmol/L (ref 98–111)
Creatinine, Ser: 0.66 mg/dL (ref 0.44–1.00)
GFR, Estimated: >60 mL/min (ref >60)
Glucose, Bld: 215 mg/dL — ABNORMAL HIGH (ref 70–99)
Potassium: 3.6 mmol/L (ref 3.5–5.1)
Sodium: 138 mmol/L (ref 135–145)

## 2023-10-26 LAB — HAPTOGLOBIN: Haptoglobin: 142 mg/dL (ref 37–355)

## 2023-10-26 LAB — GLUCOSE, CAPILLARY
Glucose-Capillary: 131 mg/dL — ABNORMAL HIGH (ref 70–99)
Glucose-Capillary: 127 mg/dL — ABNORMAL HIGH (ref 70–99)
Glucose-Capillary: 139 mg/dL — ABNORMAL HIGH (ref 70–99)
Glucose-Capillary: 148 mg/dL — ABNORMAL HIGH (ref 70–99)

## 2023-10-26 LAB — HEMOGLOBIN AND HEMATOCRIT, BLOOD
HCT: 32.8 % — ABNORMAL LOW (ref 36.0–46.0)
Hemoglobin: 9.5 g/dL — ABNORMAL LOW (ref 12.0–15.0)

## 2023-10-26 LAB — LIPOPROTEIN A (LPA): Lipoprotein (a): 205 nmol/L — ABNORMAL HIGH (ref <75.0)

## 2023-10-26 LAB — FERRITIN: Ferritin: 16 ng/mL (ref 11–307)

## 2023-10-26 MED ORDER — NICOTINE 21 MG/24HR TD PT24
21.0000 mg | MEDICATED_PATCH | Freq: Every day | TRANSDERMAL | Status: DC
  Administered 2023-10-26 – 2023-11-02 (×8): 21 mg via TRANSDERMAL
  Filled 2023-10-26 (×8): qty 1

## 2023-10-26 NOTE — Progress Notes (Signed)
 Pre-CABG vascular exams has been completed.    Results can be found under chart review under CV PROC. 10/26/2023 12:36 PM Yolanda Huffstetler RVT, RDMS

## 2023-10-26 NOTE — Progress Notes (Addendum)
 Patient Name: Katelyn Cline Date of Encounter: 10/26/2023 [facility] Cardiologist: Lauro Portal, MD   Interval Summary  .    Feels notably better today.  No further chest pain.  No longer feeling any fluttering or heartbeats.  No dyspnea.  Vital Signs .    Vitals:   10/25/23 2053 10/25/23 2317 10/26/23 0308 10/26/23 0754  BP: (!) 157/71 (!) 168/54 139/64 130/60  Pulse: 76 81 90 68  Resp: 18 19 17 17   Temp: 98 F (36.7 C) 97.9 F (36.6 C) 98.1 F (36.7 C) 98.1 F (36.7 C)  TempSrc: Oral Oral Oral Oral  SpO2: 96% 91% 90% 97%  Weight:      Height:       No intake or output data in the 24 hours ending 10/26/23 0959    10/25/2023   12:32 AM 10/20/2023   10:41 AM 10/01/2023    8:51 AM  Last 3 Weights  Weight (lbs) 207 lb 207 lb 6.4 oz 205 lb  Weight (kg) 93.895 kg 94.076 kg 92.987 kg      Telemetry/ECG    Sinus Rhythm - Personally Reviewed  Physical Exam .    GEN: No acute distress.   Neck: No JVD Cardiac:   RRR,Distant heart sounds were normal S1 and S2.  Harsh 2-3/6 SEM at RUSB-neck.  Otherwise no additional no murmurs, rubs, or gallops.  Respiratory:   Nonlabored with mild expiratory wheeze GI: Soft, nontender, non-distended  MS: Trivial bilateral LE edema with mild varicose changes.  Diminished pulses bilateral pedal pulses.  Assessment & Plan .     63 y.o. female with a hx of 3v CAD, atrial fibrillation, COPD, 100 pack year smoking history, diabetes, HTN, HLD, PAD, moderate aortic stenosis was seen for the evaluation of unstable angina at the request of the ED.    Unstable Angina in the setting of known CAD with anemia => to clarify, patient has stable CAD but presented with angina in the setting of GI bleed Multivessel CAD with angina Moderate Aortic stenosis -- Recent outpatient cardiac catheterization 4/4 multivessel CAD w/ moderate aortic valve stenosis.  Pending outpatient CVTS consultation 5/2.  Presented back with chest pain that started  yesterday morning, did have improvement with sublingual nitroglycerin .  But quick return of symptoms therefore presented to the ED. -- High-sensitivity troponin 34>>45, EKG with sinus rhythm, ST depression in inferior lateral leads. -- Received aspirin  in the ED and started on heparin  drip ( will hold for now with decline in Hgb) -- Consulted CVTS for inpatient evaluation, Dr. Deloise Ferries to see => Per discussion with Dr. Deloise Ferries, he agrees that the patient is a likely surgical candidate.  According to him as long as her current hemoglobin issues have resolved, he would be okay proceeding with surgery prior to GI evaluation with EGD and colonoscopy -- Continue atorvastatin  80 mg daily, Zetia  10 mg daily, fenofibrate  160 mg daily, increase Imdur  to 60 mg daily   Anemia w/GI bleed Polycythemia Vera -- She is on hydroxyurea  for history of polycythemia vera, now presents with new anemia with positive occult stool.  Does report she has had some dark tarry stools intermittently over the past couple months.  She has seen GI in the past, colonoscopy recommended in 2023 but this was not completed. -- Hemoglobin 8.9>>8.3, Iron 35, sat ratios 7 => we have curb sided hematology oncology, and indicated that if her iron levels are low, okay to treat => recommended venofer 300 mg times 2 ;  thankfully, repeat hemoglobin level was up to 9.7 which is unusual in the absence of any therapy. -- GI consult requested, ongoing discussion regarding timing of endoscopic procedures  => provide her hemoglobin level stays stable, plan would be to proceed with CABG and then assess with EGD colonoscopy even potentially during the hospitalization for CABG.   COPD Chronic O2 dependence -- Mild wheezing on exam, chronically on 3 L; seems stable.   Hypertension -- improved this morning; however has been labile. -- Continue diltiazem  120 mg daily, continue lisinopril  to 10mg  daily, continue Imdur  60mg  daily.  -- Hold  hydrochlorothiazide  for now. ->  Could consider increasing diltiazem  dose for additional antianginal benefit.   HLD -- continue high-dose atorvastatin  80 mg   Paroxysmal atrial fibrillation -- Episodes of atrial fibrillation while in the ED, currently in sinus rhythm -- Eliquis  and heparin  held in the setting of GI bleeding, => will likely hold Eliquis  until upcoming CABG. -- Continue home diltiazem  => blood pressure to increase dosage from discharge   Peripheral neuropathy -- Continue home duloxetine     Depression -- Continue home bupropion    Diabetes -- Continue SSI   PAD with Claudication -- Bilateral lower extremity claudication with Dopplers performed 09/22/2023 revealing right ABI of 0.53 and a left 0.59 with bilateral SFA disease.  -- plans for further management after coronary disease/valve disease addressed    Tobacco use -- Cessation advised  For questions or updates, please contact [facility] Please consult [email or web address] for contact info under        Signed, Johnie Nailer, NP    ATTENDING ATTESTATION  I have seen, examined and evaluated the patient this afternoon after preop CABG studies were done -> subjective and physical exam findings performed by attending- with Johnie Nailer, NP acting as scribe.  After reviewing all the available data and chart, we discussed the patients laboratory, study & physical findings as well as symptoms in detail.  I agree with the above findings, examination as well as impression recommendations as per our discussion.    Attending adjustments noted in italics.   Thankfully, hemoglobin stabilized out.  She is feeling well.  Ultimately the goal be to ensure that her hemoglobin is stable, potentially replace iron depending on iron studies.  Await formal consult result from Dr. Deloise Ferries who likely will recommend discharge home if stable will plan for upcoming CABG in the next week or so. In the interim, we have increased  Imdur  dose and will likely increase diltiazem  dose for additional antianginal benefit.  We will hold Eliquis  for any further bleeding.  Anticipate discharge as early as 10/27/2023 versus 10/28/2023, depending on lab results.    Arleen Lacer, MD, MS Randene Bustard, M.D., M.S. Interventional Cardiologist  [facility]  Pager # [phone] Phone # [phone] [address] [place], [place] [address]

## 2023-10-27 DIAGNOSIS — I350 Nonrheumatic aortic (valve) stenosis: Secondary | ICD-10-CM

## 2023-10-27 DIAGNOSIS — Z9981 Dependence on supplemental oxygen: Secondary | ICD-10-CM

## 2023-10-27 DIAGNOSIS — D500 Iron deficiency anemia secondary to blood loss (chronic): Secondary | ICD-10-CM | POA: Diagnosis not present

## 2023-10-27 DIAGNOSIS — E785 Hyperlipidemia, unspecified: Secondary | ICD-10-CM | POA: Diagnosis not present

## 2023-10-27 DIAGNOSIS — I200 Unstable angina: Secondary | ICD-10-CM

## 2023-10-27 DIAGNOSIS — D649 Anemia, unspecified: Secondary | ICD-10-CM | POA: Diagnosis not present

## 2023-10-27 DIAGNOSIS — J9611 Chronic respiratory failure with hypoxia: Secondary | ICD-10-CM

## 2023-10-27 LAB — GLUCOSE, CAPILLARY
Glucose-Capillary: 152 mg/dL — ABNORMAL HIGH (ref 70–99)
Glucose-Capillary: 157 mg/dL — ABNORMAL HIGH (ref 70–99)
Glucose-Capillary: 185 mg/dL — ABNORMAL HIGH (ref 70–99)
Glucose-Capillary: 132 mg/dL — ABNORMAL HIGH (ref 70–99)

## 2023-10-27 LAB — HEMOGLOBIN FREE, PLASMA: Hgb, Plasma: 21.7 mg/dL — ABNORMAL HIGH (ref 0.0–4.9)

## 2023-10-27 LAB — CBC
HCT: 31.1 % — ABNORMAL LOW (ref 36.0–46.0)
Hemoglobin: 8.9 g/dL — ABNORMAL LOW (ref 12.0–15.0)
MCH: 25.6 pg — ABNORMAL LOW (ref 26.0–34.0)
MCHC: 28.6 g/dL — ABNORMAL LOW (ref 30.0–36.0)
MCV: 89.6 fL (ref 80.0–100.0)
Platelets: 384 10*3/uL (ref 150–400)
RBC: 3.47 MIL/uL — ABNORMAL LOW (ref 3.87–5.11)
RDW: 19.2 % — ABNORMAL HIGH (ref 11.5–15.5)
WBC: 11.4 10*3/uL — ABNORMAL HIGH (ref 4.0–10.5)
nRBC: 0.4 % — ABNORMAL HIGH (ref 0.0–0.2)

## 2023-10-27 LAB — BASIC METABOLIC PANEL WITH GFR
Anion gap: 8 (ref 5–15)
BUN: 26 mg/dL — ABNORMAL HIGH (ref 8–23)
CO2: 27 mmol/L (ref 22–32)
Calcium: 8.9 mg/dL (ref 8.9–10.3)
Chloride: 102 mmol/L (ref 98–111)
Creatinine, Ser: 0.82 mg/dL (ref 0.44–1.00)
GFR, Estimated: >60 mL/min (ref >60)
Glucose, Bld: 137 mg/dL — ABNORMAL HIGH (ref 70–99)
Potassium: 4.5 mmol/L (ref 3.5–5.1)
Sodium: 137 mmol/L (ref 135–145)

## 2023-10-27 MED ORDER — IRON SUCROSE 300 MG IVPB - SIMPLE MED
300.0000 mg | Status: DC
  Administered 2023-10-27: 300 mg via INTRAVENOUS
  Filled 2023-10-27: qty 300
  Filled 2023-10-27: qty 265

## 2023-10-27 NOTE — Progress Notes (Addendum)
 Patient Name: Katelyn Cline Date of Encounter: 10/27/2023 [facility] Cardiologist: Lauro Portal, MD   Interval Summary  .    No chest pain since admission. Has not had a BM since admission either. Has been up in the room walking with no chest pain.   She says she was feeling fine and moving around the room until later in the afternoon she did have a little bit of discomfort in the chest.  She had certainly has anxiety about the upcoming procedure and is very concerned about going home.  Vital Signs .    Vitals:   10/26/23 2021 10/27/23 0003 10/27/23 0440 10/27/23 0751  BP: (!) 147/48 (!) 142/56 (!) 121/49 132/63  Pulse: 60 75 (!) 53 72  Resp: 14 19 18 16   Temp: 98 F (36.7 C) 97.8 F (36.6 C) 97.8 F (36.6 C) 98.3 F (36.8 C)  TempSrc: Oral Oral Oral Oral  SpO2: 98% 98% 95% 94%  Weight:      Height:       No intake or output data in the 24 hours ending 10/27/23 0935    10/25/2023   12:32 AM 10/20/2023   10:41 AM 10/01/2023    8:51 AM  Last 3 Weights  Weight (lbs) 207 lb 207 lb 6.4 oz 205 lb  Weight (kg) 93.895 kg 94.076 kg 92.987 kg      Telemetry/ECG    Sinus Rhythm, some intermittent junctional rhythm at times - Personally Reviewed  Physical Exam .    GEN: No acute distress.   Neck: No JVD Cardiac: RRR, + 2/6 systolic murmur RUSB, no rubs, or gallops.  Respiratory: Clear to auscultation bilaterally. GI: Soft, nontender, non-distended  MS: No edema  Assessment & Plan .     63 y.o. female with a hx of 3v CAD, atrial fibrillation, COPD, 100 pack year smoking history, diabetes, HTN, HLD, PAD, moderate aortic stenosis was seen for the evaluation of unstable angina at the request of the ED.    Unstable Angina Multivessel CAD Moderate Aortic stenosis -- Recent outpatient cardiac catheterization 4/4 multivessel CAD w/ moderate aortic valve stenosis.  Pending outpatient CVTS consultation 5/2.  Presented back with chest pain that started yesterday  morning, did have improvement with sublingual nitroglycerin .  But quick return of symptoms therefore presented to the ED. -- High-sensitivity troponin 34>>45, EKG with sinus rhythm, ST depression in inferior lateral leads. -- Received aspirin  in the ED and started on heparin  drip which was held with drop in Hgb -- Seen by Dr. Deloise Ferries who agrees to CABG/AVR with atrial clipping and possible MAZE, and as long as hgb remains stable can defer endoscopy at this time.  -- Continue atorvastatin  80 mg daily, Zetia  10 mg daily, fenofibrate  160 mg daily, increase Imdur  to 60 mg daily   Anemia w/GI bleed Polycythemia Vera -- She is on hydroxyurea  for history of polycythemia vera, now presents with new anemia with positive occult stool.  Does report she has had some dark tarry stools intermittently over the past couple months.  She has seen GI in the past, colonoscopy recommended in 2023 but this was not completed. -- Hemoglobin 8.9>>8.3>9.5 (likely spurious valve)>>8.9, Iron 35, sat ratios 7  -- GI consulted, ideally needs endoscopy but CT surgery ok with deferring until after CABG/AVR in light of stable hgb trend  -- no further bleeding since admission but no BM yet -- curbside with hem/onc yesterday with recs for venofer 300mg  x2   COPD Chronic O2  dependence -- no wheezing on exam today, chronically on 3 L  Hypertension -- controlled -- Continue diltiazem  120 mg daily, lisinopril  to 10mg  daily. Hold hydrochlorothiazide  for now. Continue Imdur  to 60 mg daily   HLD -- continue statin   Paroxysmal atrial fibrillation -- Episodes of atrial fibrillation while in the ED, currently in sinus rhythm -- Eliquis  held in the setting of GI bleeding, with potential plans for CABG/AVR next week will continue to hold Eliquis  for now -- Continue home diltiazem    Peripheral neuropathy -- Continue home duloxetine     Depression -- Continue home bupropion    Diabetes -- Continue SSI   PAD with  Claudication -- Bilateral lower extremity claudication with Dopplers performed 09/22/2023 revealing right ABI of 0.53 and a left 0.59 with bilateral SFA disease.  -- plans for further management after coronary disease/valve disease addressed    Tobacco use -- Cessation advised  Dispo-> likely home tomorrow pending stable Hgb and symptoms => discussed with the patient.  She was concerned about having some chest discomfort this afternoon walking on the room and is having increasing anxiety about what she would do where she to go home and have another episode of chest pain.  At this point, if we are able to confirm an early week CABG for next week, I think it may be best for the patient stay in the hospital to avoid a "bounce back "for recurrent symptoms.  This will allow us  to continue to monitor her anemia levels, and titrate medications as necessary.  We can reassess on daily basis to see if she is stable for discharge but at this point would potentially consider keeping her till her surgery.  Is possible that some of the chest discomfort could just be related to anxiety however with the extent of CAD prudent would dictate monitoring her until surgery.  For questions or updates, please contact [facility] Please consult [email or web address] for contact info under        Signed, Johnie Nailer, NP    ATTENDING ATTESTATION  I have seen, examined and evaluated the patient this Morning on rounds along with Ms. Adelene Homer, NP and then return to see her in the afternoon..  After reviewing all the available data and chart, we discussed the patients laboratory, study & physical findings as well as symptoms in detail.  I agree with the initial findings, examination as well as impression recommendations as per our discussion.    Attending adjustments noted in italics.   We have outlined a great plan for monitoring and managing her symptoms as well as treating her anemia above.  However please note the  concerns of her having some chest pain episodes in the afternoon while doing more than just simply walking around the room.  She is very concerned about going home and having his symptoms and not knowing what she supposed to do.  Would like to see if we can potentially get her stabilized to allow potential discharge home prior to CABG on 5 /7 we may need to gently titrate antianginals and potentially anxiolytics.  Will continue to monitor and determine course of action on daily basis.Arleen Lacer, MD, MS Randene Bustard, M.D., M.S. Interventional Cardiologist  [facility]  Pager # [phone] Phone # [phone] [address] [place], [place] [address]

## 2023-10-27 NOTE — Progress Notes (Signed)
 Mobility Specialist Progress Note:    10/27/23 1628  Mobility  Activity Ambulated with assistance in hallway  Level of Assistance Contact guard assist, steadying assist  Assistive Device Front wheel walker  Distance Ambulated (ft) 600 ft  Activity Response Tolerated well  Mobility Referral Yes  Mobility visit 1 Mobility  Mobility Specialist Start Time (ACUTE ONLY) 1350  Mobility Specialist Stop Time (ACUTE ONLY) 1405  Mobility Specialist Time Calculation (min) (ACUTE ONLY) 15 min   Received pt in chair having no complaints and agreeable to mobility. Pt was asymptomatic throughout ambulation and returned to room w/o fault. Left in bed w/ call bell in reach and all needs met. MD in room.   Inetta Manes Mobility Specialist  Please contact vis Secure Chat or  Rehab Office [phone]

## 2023-10-27 NOTE — Plan of Care (Signed)
   Problem: Fluid Volume: Goal: Ability to maintain a balanced intake and output will improve Outcome: Progressing   Problem: Nutritional: Goal: Maintenance of adequate nutrition will improve Outcome: Progressing

## 2023-10-27 NOTE — Progress Notes (Signed)
 Daily Progress Note  DOA: 10/25/2023 Hospital Day: 3   Chief Complaint:   ASSESSMENT    63 y.o. year old female HTN, moderate AS, DM, CAD, PAD, COPD, panic attacks, polycythemia vera, melanoma s/p RUL resection in 2014. Admitted today with unstable angina   Unstable angina  3v CAD Moderate AS  Anemia w/GI bleed Polycythemia Vera Cardiothoracic Surgery planning surgery.  Per Cardiology's notes, case discussed with Dr. Bishop Bullock (CVTS) who is okay preceding with cardiac surgery prior to endoscopic evaulation of anemia / GI bleed as long as H/H remains stable.    Today;  No overt GI bleeding ( no BMs). Hgb overall remains stable fluctuating between upper 8s and mid 9s.   Acute on chronic constipation Last BM~ 1 week ago. Takes Senokot at home  COPD Chronic O2 dependence   PLAN   --Miralax  once daily  --Senokot BID --Dulcolax supp as needed --Cardiology ordering IV iron ( they spoke with her Hematologist) Trend H/H --Endoscopic evaluation after CABG until clinical course dictates otherwise.    Subjective   No complaints. No BM in a week.   Objective    Recent Labs    10/25/23 1020 10/26/23 0859 10/26/23 2129 10/27/23 0342  WBC 11.3* 13.0*  --  11.4*  HGB 8.3* 9.7* 9.5* 8.9*  HCT 29.6* 33.4* 32.8* 31.1*  MCV 91.9 90.3  --  89.6  PLT 283 367  --  384   Recent Labs    10/26/23 2045  FERRITIN 16  TIBC 521*  IRONPCTSAT 6*   Recent Labs    10/25/23 0025 10/26/23 0859 10/27/23 0342  NA 139 138 137  K 3.5 3.6 4.5  CL 103 101 102  CO2 26 29 27   GLUCOSE 145* 215* 137*  BUN 12 17 26*  CREATININE 0.53 0.66 0.82  CALCIUM  9.0 9.2 8.9    Imaging:  VAS US  DOPPLER PRE CABG PREOPERATIVE VASCULAR EVALUATION  Patient Name:  Katelyn Cline  Date of Exam:   10/26/2023 Medical Rec #: [id number]         Accession #:    [id number] Date of Birth: 11-23-1960         Patient Gender: F Patient Age:   60 years Exam Location:  [facility] Procedure:       VAS US  DOPPLER PRE CABG Referring Phys: HARRELL LIGHTFOOT  --------------------------------------------------------------------------------   Indications:   Pre-CABG. Risk Factors:  Hypertension, hyperlipidemia, Diabetes, current smoker, coronary                artery disease, PAD. Other Factors: Afib,.  Performing Technologist: Arlyce Berger RVT, RDMS    Examination Guidelines: A complete evaluation includes B-mode imaging, spectral Doppler, color Doppler, and power Doppler as needed of all accessible portions of each vessel. Bilateral testing is considered an integral part of a complete examination. Limited examinations for reoccurring indications may be performed as noted.    Right Carotid Findings: +----------+--------+--------+--------+------------------+------------------+           PSV cm/sEDV cm/sStenosisDescribe          Comments           +----------+--------+--------+--------+------------------+------------------+ CCA Prox  53      10                                intimal thickening +----------+--------+--------+--------+------------------+------------------+ CCA Distal61      11                                                   +----------+--------+--------+--------+------------------+------------------+  ICA Prox  70      15              calcific and focal                   +----------+--------+--------+--------+------------------+------------------+ ICA Distal58      14                                                   +----------+--------+--------+--------+------------------+------------------+ ECA       129                                                          +----------+--------+--------+--------+------------------+------------------+  +----------+--------+-------+----------------+------------+           PSV cm/sEDV cmsDescribe        Arm  Pressure +----------+--------+-------+----------------+------------+ Subclavian216            Multiphasic, WNL             +----------+--------+-------+----------------+------------+  +---------+--------+--+--------+--+---------+ VertebralPSV cm/s62EDV cm/s11Antegrade +---------+--------+--+--------+--+---------+  Left Carotid Findings: +----------+--------+--------+--------+------------------+------------------+           PSV cm/sEDV cm/sStenosisDescribe          Comments           +----------+--------+--------+--------+------------------+------------------+ CCA Prox  73      11              calcific and focal                   +----------+--------+--------+--------+------------------+------------------+ CCA Distal67      12              calcific          intimal thickening +----------+--------+--------+--------+------------------+------------------+ ICA Prox  101     26      1-39%   calcific                             +----------+--------+--------+--------+------------------+------------------+ ICA Distal67      18                                                   +----------+--------+--------+--------+------------------+------------------+ ECA       87                                                           +----------+--------+--------+--------+------------------+------------------+  +----------+--------+--------+----------------+------------+ SubclavianPSV cm/sEDV cm/sDescribe        Arm Pressure +----------+--------+--------+----------------+------------+           121             Multiphasic, WNL             +----------+--------+--------+----------------+------------+  +---------+--------+--+--------+-+----------------------+ VertebralPSV cm/s16EDV cm/s6Antegrade and dampened +---------+--------+--+--------+-+----------------------+    ABI Findings: +---------+ Waveform   +---------+ triphasic +---------+  +---------+ Waveform  +---------+ triphasic +---------+  +-------+----------------+  ABI/TBIPrevious ABI/TBI +-------+----------------+ Right  0.53 / 0.30      +-------+----------------+ Left   0.59 / 0.36      +-------+----------------+ ABI performed on 09/22/2023.   Right Doppler Findings: +--------+---------+ Site    Doppler   +--------+---------+ Brachialtriphasic +--------+---------+ Radial  triphasic +--------+---------+ Ulnar   triphasic +--------+---------+     Left Doppler Findings: +--------+---------+ Site    Doppler   +--------+---------+ Brachialtriphasic +--------+---------+ Radial  triphasic +--------+---------+ Ulnar   triphasic +--------+---------+     Technologist Notes: Brachial pressures with difference despite multiple attempts. NIBP taken -LUE 145/73 & RUE 125/68.    Summary: Right Carotid: The extracranial vessels were near-normal with only minimal wall                thickening or plaque.  Left Carotid: Velocities in the left ICA are consistent with a 1-39% stenosis. Vertebrals:  Bilateral vertebral arteries demonstrate antegrade flow. Dampened              flow seen in right vertebral artery. Subclavians: Normal flow hemodynamics were seen in bilateral subclavian              arteries.  Right Upper Extremity: Doppler waveforms decrease 50% with right radial compression. Doppler waveforms remain within normal limits with right ulnar compression. Left Upper Extremity: Doppler waveforms decrease <50% w left radial compression. Doppler waveforms remain within normal limits with left ulnar compression.      Preliminary       Scheduled inpatient medications:   aspirin  EC  81 mg Oral Daily   atorvastatin   80 mg Oral QHS   buPROPion  ER  100 mg Oral BID   diltiazem   120 mg Oral QHS   DULoxetine   60 mg Oral BID   ezetimibe   10 mg Oral q morning    fenofibrate   160 mg Oral Daily   hydroxyurea   1,000 mg Oral Once per day on Sunday Tuesday Thursday Saturday   And   hydroxyurea   1,500 mg Oral Once per day on Monday Wednesday Friday   insulin  aspart  0-15 Units Subcutaneous TID WC   insulin  aspart  0-5 Units Subcutaneous QHS   isosorbide  mononitrate  60 mg Oral QHS   lisinopril   10 mg Oral QHS   nicotine  21 mg Transdermal Daily   Continuous inpatient infusions:   iron sucrose     PRN inpatient medications: nitroGLYCERIN , ondansetron  (ZOFRAN ) IV, oxyCODONE -acetaminophen  **AND** oxyCODONE   Vital signs in last 24 hours: Temp:  [97.7 F (36.5 C)-98.3 F (36.8 C)] 98.3 F (36.8 C) (04/30 0751) Pulse Rate:  [53-78] 72 (04/30 0751) Resp:  [14-20] 16 (04/30 0751) BP: (104-147)/(48-73) 132/63 (04/30 0751) SpO2:  [94 %-98 %] 94 % (04/30 0751) Last BM Date : 10/24/23 No intake or output data in the 24 hours ending 10/27/23 0951  Intake/Output from previous day: No intake/output data recorded. Intake/Output this shift: No intake/output data recorded.   Physical Exam:  General: Alert female in NAD Heart:  Regular rate and rhythm.  Pulmonary: LLL crackles, otherwise CTA Abdomen: Soft, nondistended, nontender. Normal bowel sounds. Extremities: No lower extremity edema  Neurologic: Alert and oriented Psych: Pleasant. Cooperative     LOS: 2 days   Mai Schwalbe ,NP 10/27/2023, 9:51 AM

## 2023-10-27 NOTE — Progress Notes (Signed)
     [address]       Katelyn Cline [address]             [phone]       Surgery scheduled for 5/7 High risk due to pulmonary status.  Pt would like to proceed  [name]

## 2023-10-27 NOTE — Progress Notes (Signed)
 Mobility Specialist Progress Note:    10/27/23 1037  Mobility  Activity Ambulated with assistance in room;Ambulated with assistance in hallway;Transferred from bed to chair  Level of Assistance Contact guard assist, steadying assist  Assistive Device Front wheel walker  Distance Ambulated (ft) 380 ft  Activity Response Tolerated well  Mobility Referral Yes  Mobility visit 1 Mobility  Mobility Specialist Start Time (ACUTE ONLY) 1019  Mobility Specialist Stop Time (ACUTE ONLY) 1035  Mobility Specialist Time Calculation (min) (ACUTE ONLY) 16 min   Pt received dangling EOB, agreeable to mobility session. Ambulated in room and hallway with RW and MinG for safety. Tolerated well, asx throughout and no LOB. SpO2 95% on 3L throughout session. Returned pt to room, sitting up in recliner with all needs met, call bell in reach.    Katelyn Cline Mobility Specialist Please contact via Special educational needs teacher or  Rehab office at [phone]

## 2023-10-28 DIAGNOSIS — D649 Anemia, unspecified: Secondary | ICD-10-CM | POA: Diagnosis not present

## 2023-10-28 DIAGNOSIS — J42 Unspecified chronic bronchitis: Secondary | ICD-10-CM

## 2023-10-28 DIAGNOSIS — I739 Peripheral vascular disease, unspecified: Secondary | ICD-10-CM

## 2023-10-28 DIAGNOSIS — J9611 Chronic respiratory failure with hypoxia: Secondary | ICD-10-CM | POA: Diagnosis not present

## 2023-10-28 DIAGNOSIS — I200 Unstable angina: Secondary | ICD-10-CM | POA: Diagnosis not present

## 2023-10-28 LAB — CBC
HCT: 31.3 % — ABNORMAL LOW (ref 36.0–46.0)
Hemoglobin: 9 g/dL — ABNORMAL LOW (ref 12.0–15.0)
MCH: 25.9 pg — ABNORMAL LOW (ref 26.0–34.0)
MCHC: 28.8 g/dL — ABNORMAL LOW (ref 30.0–36.0)
MCV: 89.9 fL (ref 80.0–100.0)
Platelets: 440 10*3/uL — ABNORMAL HIGH (ref 150–400)
RBC: 3.48 MIL/uL — ABNORMAL LOW (ref 3.87–5.11)
RDW: 19.2 % — ABNORMAL HIGH (ref 11.5–15.5)
WBC: 11.3 10*3/uL — ABNORMAL HIGH (ref 4.0–10.5)
nRBC: 0.5 % — ABNORMAL HIGH (ref 0.0–0.2)

## 2023-10-28 LAB — GLUCOSE, CAPILLARY
Glucose-Capillary: 141 mg/dL — ABNORMAL HIGH (ref 70–99)
Glucose-Capillary: 132 mg/dL — ABNORMAL HIGH (ref 70–99)
Glucose-Capillary: 198 mg/dL — ABNORMAL HIGH (ref 70–99)
Glucose-Capillary: 131 mg/dL — ABNORMAL HIGH (ref 70–99)

## 2023-10-28 LAB — COMPREHENSIVE METABOLIC PANEL WITH GFR
ALT: 16 U/L (ref 0–44)
AST: 14 U/L — ABNORMAL LOW (ref 15–41)
Albumin: 3.1 g/dL — ABNORMAL LOW (ref 3.5–5.0)
Alkaline Phosphatase: 62 U/L (ref 38–126)
Anion gap: 9 (ref 5–15)
BUN: 24 mg/dL — ABNORMAL HIGH (ref 8–23)
CO2: 26 mmol/L (ref 22–32)
Calcium: 9.3 mg/dL (ref 8.9–10.3)
Chloride: 104 mmol/L (ref 98–111)
Creatinine, Ser: 0.68 mg/dL (ref 0.44–1.00)
GFR, Estimated: >60 mL/min (ref >60)
Glucose, Bld: 138 mg/dL — ABNORMAL HIGH (ref 70–99)
Potassium: 4.1 mmol/L (ref 3.5–5.1)
Sodium: 139 mmol/L (ref 135–145)
Total Bilirubin: 0.6 mg/dL (ref 0.0–1.2)
Total Protein: 6 g/dL — ABNORMAL LOW (ref 6.5–8.1)

## 2023-10-28 LAB — URINALYSIS, ROUTINE W REFLEX MICROSCOPIC
Bilirubin Urine: NEGATIVE
Glucose, UA: NEGATIVE mg/dL
Hgb urine dipstick: NEGATIVE
Ketones, ur: NEGATIVE mg/dL
Leukocytes,Ua: NEGATIVE
Nitrite: NEGATIVE
Protein, ur: NEGATIVE mg/dL
Specific Gravity, Urine: 1.006 (ref 1.005–1.030)
pH: 5 (ref 5.0–8.0)

## 2023-10-28 LAB — SOLUBLE TRANSFERRIN RECEPTOR: Transferrin Receptor: 98 nmol/L — ABNORMAL HIGH (ref 12.2–27.3)

## 2023-10-28 LAB — HEMOGLOBIN A1C
Hgb A1c MFr Bld: 5.1 % (ref 4.8–5.6)
Mean Plasma Glucose: 99.67 mg/dL

## 2023-10-28 LAB — PROTIME-INR
INR: 1.1 (ref 0.8–1.2)
Prothrombin Time: 14 s (ref 11.4–15.2)

## 2023-10-28 LAB — APTT: aPTT: 35 s (ref 24–36)

## 2023-10-28 MED ORDER — SENNA 8.6 MG PO TABS
1.0000 | ORAL_TABLET | Freq: Two times a day (BID) | ORAL | Status: DC
  Administered 2023-10-28 – 2023-11-02 (×10): 8.6 mg via ORAL
  Filled 2023-10-28 (×11): qty 1

## 2023-10-28 MED ORDER — SODIUM CHLORIDE 0.9 % IV SOLN
300.0000 mg | Freq: Once | INTRAVENOUS | Status: AC
  Administered 2023-10-28: 300 mg via INTRAVENOUS
  Filled 2023-10-28: qty 15

## 2023-10-28 MED ORDER — BISACODYL 5 MG PO TBEC
5.0000 mg | DELAYED_RELEASE_TABLET | Freq: Once | ORAL | Status: AC
  Administered 2023-10-28: 5 mg via ORAL
  Filled 2023-10-28 (×2): qty 1

## 2023-10-28 MED ORDER — MAGNESIUM HYDROXIDE 400 MG/5ML PO SUSP: 30.0000 mL | Freq: Every day | ORAL | Status: DC | PRN

## 2023-10-28 MED ORDER — LACTULOSE 10 GM/15ML PO SOLN: 30.0000 g | Freq: Once | ORAL | Status: DC

## 2023-10-28 MED ORDER — LISINOPRIL 20 MG PO TABS
20.0000 mg | ORAL_TABLET | Freq: Every day | ORAL | Status: DC
  Administered 2023-10-28 – 2023-10-31 (×4): 20 mg via ORAL
  Filled 2023-10-28 (×4): qty 1

## 2023-10-28 MED ORDER — FUROSEMIDE 20 MG PO TABS
20.0000 mg | ORAL_TABLET | Freq: Every day | ORAL | Status: DC
  Administered 2023-10-28 – 2023-10-31 (×4): 20 mg via ORAL
  Filled 2023-10-28 (×4): qty 1

## 2023-10-28 MED ORDER — SENNOSIDES 8.6 MG PO TABS: 1.0000 | ORAL_TABLET | Freq: Two times a day (BID) | ORAL | Status: DC

## 2023-10-28 NOTE — Progress Notes (Addendum)
 Patient Name: Katelyn Cline Date of Encounter: 10/28/2023  [facility] Cardiologist: Lauro Portal, MD   Interval Summary  .    Had some chest discomfort with radiation into the left arm yesterday afternoon after she walked. None this morning. No BM yet  Vital Signs .    Vitals:   10/27/23 2334 10/28/23 0425 10/28/23 0755 10/28/23 0906  BP: (!) 140/64 132/60  (!) 141/53  Pulse: 62 67  69  Resp: 19 16  16   Temp: 98.4 F (36.9 C) 97.8 F (36.6 C) (!) 97.5 F (36.4 C) 98.1 F (36.7 C)  TempSrc: Oral Axillary Oral Oral  SpO2: 98% 100%  100%  Weight:      Height:        Intake/Output Summary (Last 24 hours) at 10/28/2023 0955 Last data filed at 10/28/2023 0400 Gross per 24 hour  Intake 745 ml  Output --  Net 745 ml      10/25/2023   12:32 AM 10/20/2023   10:41 AM 10/01/2023    8:51 AM  Last 3 Weights  Weight (lbs) 207 lb 207 lb 6.4 oz 205 lb  Weight (kg) 93.895 kg 94.076 kg 92.987 kg      Telemetry/ECG    Sinus Rhythm with intermittent junctional rhythm - Personally Reviewed  Physical Exam .    GEN: No acute distress. Remains on 3L [place] Neck: No JVD Cardiac: RRR, 2/6 systolic murmur RUSB, no rubs, or gallops.  Respiratory: Clear to auscultation bilaterally. GI: Soft, nontender, non-distended  MS: No edema  Assessment & Plan .     63 y.o. female with a hx of 3v CAD, atrial fibrillation, COPD, 100 pack year smoking history, diabetes, HTN, HLD, PAD, moderate aortic stenosis was seen for the evaluation of unstable angina at the request of the ED.    Unstable Angina Multivessel CAD Moderate Aortic stenosis -- Recent outpatient cardiac catheterization 4/4 multivessel CAD w/ moderate aortic valve stenosis.  Pending outpatient CVTS consultation 5/2.  Presented back with chest pain that started yesterday morning, did have improvement with sublingual nitroglycerin .  But quick return of symptoms therefore presented to the ED. -- High-sensitivity troponin 34>>45,  EKG with sinus rhythm, ST depression in inferior lateral leads. -- Received aspirin  in the ED and started on heparin  drip which was held with drop in Hgb -- Seen by Dr. Deloise Ferries who agrees to CABG/AVR with atrial clipping and possible MAZE, and as long as hgb remains stable can defer endoscopy at this time. Planned for CABG on 5/7 -- Continue atorvastatin  80 mg daily, Zetia  10 mg daily, fenofibrate  160 mg daily, increase Imdur  to 60 mg daily   Anemia w/GI bleed Polycythemia Vera -- She is on hydroxyurea  for history of polycythemia vera, now presents with new anemia with positive occult stool.  Does report she has had some dark tarry stools intermittently over the past couple months.  She has seen GI in the past, colonoscopy recommended in 2023 but this was not completed. -- Hemoglobin 8.9>>8.3>9.5 (likely spurious valve)>>8.9, Iron  35, sat ratios 7  -- GI consulted, ideally needs endoscopy but CT surgery ok with deferring until after CABG/AVR in light of stable hgb trend  -- no further bleeding since admission but no BM yet (will order meds) -- curbside with hem/onc yesterday with recs for venofer  300mg  x2 -> getting dose #2 today.   COPD Chronic O2 dependence -- no wheezing on exam, chronically on 3 L   Hypertension -- controlled -- Continue diltiazem  120  mg daily, lisinopril  to 10mg  daily. Continue Imdur  to 60 mg daily. Holding hydrochlorothiazide  for now.    HLD -- continue statin   Paroxysmal atrial fibrillation -- Episodes of atrial fibrillation while in the ED, currently in sinus rhythm some junctional rhythms at times -- Eliquis  held in the setting of GI bleeding, with potential plans for CABG/AVR next week will continue to hold Eliquis  for now -- Continue home diltiazem    Peripheral neuropathy -- Continue home duloxetine     Depression -- Continue home bupropion    Diabetes -- Continue SSI   PAD with Claudication -- Bilateral lower extremity claudication with Dopplers  performed 09/22/2023 revealing right ABI of 0.53 and a left 0.59 with bilateral SFA disease.  -- plans for further management after coronary disease/valve disease addressed    Tobacco use -- Cessation advised  Constipation, have added senna and bisacodyl     Dispo-> initially considered for discharge pending stable labs, but did have chest pain yesterday afternoon. Suspect can reassess on daily basis for potential DC home depending on symptoms.  Concerning that she is having anginal symptoms with activity.  I wonder if some of this is related to stress.  Certainly the unstable angina process that brought her in was related to her anemia, she remains anemic with multivessel disease and therefore is at risk for having ongoing ischemic symptoms.  We can reassess on daily basis to determine whether she is stable for discharge however the close in regard to her CABG date it likely makes more sense for her to remain in the hospital from a safety standpoint.  For questions or updates, please contact [facility] Please consult [email or web address] for contact info under        Signed, Johnie Nailer, NP

## 2023-10-28 NOTE — Progress Notes (Signed)
 Mobility Specialist Progress Note:    10/28/23 1534  Mobility  Activity Ambulated with assistance in hallway  Level of Assistance Contact guard assist, steadying assist  Assistive Device Front wheel walker  Distance Ambulated (ft) 500 ft  Activity Response Tolerated well  Mobility Referral Yes  Mobility visit 1 Mobility  Mobility Specialist Start Time (ACUTE ONLY) 1100  Mobility Specialist Stop Time (ACUTE ONLY) 1115  Mobility Specialist Time Calculation (min) (ACUTE ONLY) 15 min   Received pt in bed having no complaints and agreeable to mobility. Pt was asymptomatic during session. Ambulated on 3L/min VSS. Returned to room w/o fault. Left seated EOB w/ call bell in reach and all needs met.  Inetta Manes Mobility Specialist  Please contact vis Secure Chat or  Rehab Office [phone]

## 2023-10-29 ENCOUNTER — Encounter: Admitting: Thoracic Surgery (Cardiothoracic Vascular Surgery)

## 2023-10-29 DIAGNOSIS — J42 Unspecified chronic bronchitis: Secondary | ICD-10-CM | POA: Diagnosis not present

## 2023-10-29 DIAGNOSIS — I200 Unstable angina: Secondary | ICD-10-CM | POA: Diagnosis not present

## 2023-10-29 DIAGNOSIS — J9611 Chronic respiratory failure with hypoxia: Secondary | ICD-10-CM | POA: Diagnosis not present

## 2023-10-29 DIAGNOSIS — I350 Nonrheumatic aortic (valve) stenosis: Secondary | ICD-10-CM | POA: Diagnosis not present

## 2023-10-29 LAB — BASIC METABOLIC PANEL WITH GFR
Anion gap: 10 (ref 5–15)
BUN: 19 mg/dL (ref 8–23)
CO2: 25 mmol/L (ref 22–32)
Calcium: 9.5 mg/dL (ref 8.9–10.3)
Chloride: 106 mmol/L (ref 98–111)
Creatinine, Ser: 0.74 mg/dL (ref 0.44–1.00)
GFR, Estimated: >60 mL/min (ref >60)
Glucose, Bld: 120 mg/dL — ABNORMAL HIGH (ref 70–99)
Potassium: 4.5 mmol/L (ref 3.5–5.1)
Sodium: 141 mmol/L (ref 135–145)

## 2023-10-29 LAB — GLUCOSE, CAPILLARY
Glucose-Capillary: 137 mg/dL — ABNORMAL HIGH (ref 70–99)
Glucose-Capillary: 120 mg/dL — ABNORMAL HIGH (ref 70–99)
Glucose-Capillary: 180 mg/dL — ABNORMAL HIGH (ref 70–99)
Glucose-Capillary: 154 mg/dL — ABNORMAL HIGH (ref 70–99)

## 2023-10-29 LAB — CBC
HCT: 33.5 % — ABNORMAL LOW (ref 36.0–46.0)
Hemoglobin: 9.5 g/dL — ABNORMAL LOW (ref 12.0–15.0)
MCH: 25.6 pg — ABNORMAL LOW (ref 26.0–34.0)
MCHC: 28.4 g/dL — ABNORMAL LOW (ref 30.0–36.0)
MCV: 90.3 fL (ref 80.0–100.0)
Platelets: 494 10*3/uL — ABNORMAL HIGH (ref 150–400)
RBC: 3.71 MIL/uL — ABNORMAL LOW (ref 3.87–5.11)
RDW: 19.7 % — ABNORMAL HIGH (ref 11.5–15.5)
WBC: 13.9 10*3/uL — ABNORMAL HIGH (ref 4.0–10.5)
nRBC: 0.5 % — ABNORMAL HIGH (ref 0.0–0.2)

## 2023-10-29 MED ORDER — BISACODYL 5 MG PO TBEC
5.0000 mg | DELAYED_RELEASE_TABLET | Freq: Every day | ORAL | Status: DC
  Administered 2023-10-29 – 2023-11-02 (×4): 5 mg via ORAL
  Filled 2023-10-29 (×5): qty 1

## 2023-10-29 MED ORDER — DOCUSATE SODIUM 100 MG PO CAPS
200.0000 mg | ORAL_CAPSULE | Freq: Two times a day (BID) | ORAL | Status: DC
  Administered 2023-10-29 – 2023-11-02 (×7): 200 mg via ORAL
  Filled 2023-10-29 (×8): qty 2

## 2023-10-29 MED ORDER — POLYETHYLENE GLYCOL 3350 17 G PO PACK
17.0000 g | PACK | Freq: Every day | ORAL | Status: DC
  Administered 2023-11-01 – 2023-11-02 (×2): 17 g via ORAL
  Filled 2023-10-29 (×4): qty 1

## 2023-10-29 MED ORDER — ISOSORBIDE MONONITRATE ER 30 MG PO TB24
120.0000 mg | ORAL_TABLET | Freq: Every day | ORAL | Status: DC
  Administered 2023-10-29 – 2023-11-02 (×5): 120 mg via ORAL
  Filled 2023-10-29 (×5): qty 2

## 2023-10-29 MED ORDER — DILTIAZEM HCL ER COATED BEADS 180 MG PO CP24
180.0000 mg | ORAL_CAPSULE | Freq: Every day | ORAL | Status: DC
  Administered 2023-10-29 – 2023-11-02 (×5): 180 mg via ORAL
  Filled 2023-10-29 (×6): qty 1

## 2023-10-29 NOTE — Progress Notes (Addendum)
 Rounding Note    Patient Name: Katelyn Cline Date of Encounter: 10/29/2023  [facility] Cardiologist: Lauro Portal, MD   Subjective   No acute overnight events. She reports some continued intermittent chest pain primarily at night. However, she also reports a little chest tightness this morning as well as some shortness of breath. Overall though, she states she feels the same as she did yesterday. She states she has not had a bowel movement since she has been here. She usually has 2-3 bowel movements a day. No abdominal pain.  When I evaluated her, she had just completed large BM and felt much better.  Later on this afternoon however she had some chest discomfort relieved with nitroglycerin .  Inpatient Medications    Scheduled Meds:  aspirin  EC  81 mg Oral Daily   atorvastatin   80 mg Oral QHS   buPROPion  ER  100 mg Oral BID   diltiazem   120 mg Oral QHS   DULoxetine   60 mg Oral BID   ezetimibe   10 mg Oral q morning   fenofibrate   160 mg Oral Daily   furosemide   20 mg Oral Daily   hydroxyurea   1,000 mg Oral Once per day on Sunday Tuesday Thursday Saturday   And   hydroxyurea   1,500 mg Oral Once per day on Monday Wednesday Friday   insulin  aspart  0-15 Units Subcutaneous TID WC   insulin  aspart  0-5 Units Subcutaneous QHS   isosorbide  mononitrate  60 mg Oral QHS   lisinopril   20 mg Oral QHS   nicotine   21 mg Transdermal Daily   senna  1 tablet Oral BID   Continuous Infusions:  PRN Meds: magnesium  hydroxide, nitroGLYCERIN , ondansetron  (ZOFRAN ) IV, oxyCODONE -acetaminophen  **AND** oxyCODONE    Vital Signs    Vitals:   10/28/23 2020 10/28/23 2321 10/29/23 0321 10/29/23 0853  BP: (!) 171/65 (!) 143/63 (!) 144/60 127/61  Pulse: 70 65 63 63  Resp: 16 18 17 17   Temp: 98.2 F (36.8 C) 98.2 F (36.8 C) 97.8 F (36.6 C) 98.2 F (36.8 C)  TempSrc: Oral Oral Oral Oral  SpO2: 97% 97% 97% 96%  Weight:      Height:        Intake/Output Summary (Last 24 hours)  at 10/29/2023 1121 Last data filed at 10/29/2023 0854 Gross per 24 hour  Intake 594 ml  Output --  Net 594 ml      10/25/2023   12:32 AM 10/20/2023   10:41 AM 10/01/2023    8:51 AM  Last 3 Weights  Weight (lbs) 207 lb 207 lb 6.4 oz 205 lb  Weight (kg) 93.895 kg 94.076 kg 92.987 kg      Telemetry    Sinus rhythm with baseline rates in the 50s (occasionally up to the 80s). - Personally Reviewed  ECG    No new ECG tracing today.  - Personally Reviewed  Physical Exam   GEN: No acute distress.   Neck: No JVD. Cardiac: RRR. III/ VI systolic murmur.   Respiratory: Clear to auscultation bilaterally. No wheezes, rhonchi, or rales.  MS: Trace to 1+  pitting edema of bilateral lower extremities. No deformity. Neuro:  No focal deficits.  Psych: Normal affect. Responds appropriately.  Labs    High Sensitivity Troponin:   Recent Labs  Lab 10/25/23 0025 10/25/23 0220  TROPONINIHS 34* 45*     Chemistry Recent Labs  Lab 10/26/23 0859 10/27/23 0342 10/28/23 0359  NA 138 137 139  K 3.6 4.5  4.1  CL 101 102 104  CO2 29 27 26   GLUCOSE 215* 137* 138*  BUN 17 26* 24*  CREATININE 0.66 0.82 0.68  CALCIUM  9.2 8.9 9.3  PROT  --   --  6.0*  ALBUMIN  --   --  3.1*  AST  --   --  14*  ALT  --   --  16  ALKPHOS  --   --  62  BILITOT  --   --  0.6  GFRNONAA >60 >60 >60  ANIONGAP 8 8 9     Lipids No results for input(s): "CHOL", "TRIG", "HDL", "LABVLDL", "LDLCALC", "CHOLHDL" in the last 168 hours.  Hematology Recent Labs  Lab 10/26/23 0859 10/26/23 2129 10/27/23 0342 10/28/23 0359  WBC 13.0*  --  11.4* 11.3*  RBC 3.70*  --  3.47* 3.48*  HGB 9.7* 9.5* 8.9* 9.0*  HCT 33.4* 32.8* 31.1* 31.3*  MCV 90.3  --  89.6 89.9  MCH 26.2  --  25.6* 25.9*  MCHC 29.0*  --  28.6* 28.8*  RDW 19.6*  --  19.2* 19.2*  PLT 367  --  384 440*   Thyroid  No results for input(s): "TSH", "FREET4" in the last 168 hours.  BNPNo results for input(s): "BNP", "PROBNP" in the last 168 hours.  DDimer No  results for input(s): "DDIMER" in the last 168 hours.   Radiology    No results found.  Cardiac Studies   Right/ Left Cardiac Catheterization 10/01/2023:   Mid LM to Ost LAD lesion is 45% stenosed.   1st Diag lesion is 90% stenosed.   Mid LAD-1 lesion is 70% stenosed.   Mid LAD-2 lesion is 30% stenosed. Mid LAD to Dist LAD lesion is 70% stenosed. Dist LAD lesion is 40% stenosed.   Prox Cx lesion is 80% stenosed (essentially the ostium of the AV groove portion of the Cx).   1st Mrg lesion is 90% stenosed.   .   Dist Cx lesion is 60% stenosed just prior to 2nd Mrg & 3rd Mrg lesion is 80% stenosed.   Prox RCA to Dist RCA lesion is 100% stenosed.  Retrograde flow from septal collaterals fills the PDA with retrograde flow to the occlusion site.   LV end diastolic pressure is low.   Dominance: Right  POST-OPERATIVE DIAGNOSIS:  Mean AoV gradient 19 mmHg with systemic hypertension Multivessel disease: Proximal RCA on percent CTO with the distal portion filling via left-to-right collaterals mostly the PDA.  (Cannot exclude codominant RCA) Distal left main eccentric calcified 40 proximately 50% stenosis of the vessel bifurcates into the LAD and LCx LCx bifurcates quickly into the AV groove and large OM1:  OM1 has proximal 90% stenosis and is a large-caliber vessel Ostial AVG-LCx has 80% stenosis and gives rise to small caliber LPL 1(OM2 on image) and LPL 3 (on image LPL1) and large caliber LPL 2 (on image OM3).  60% stenosis just proximal to LPL 1 and then the LPL 2 continuation of the circumflex has 80% stenosis. LAD gives off a large caliber D1 that has a very proximal focal 90% stenosis before it bifurcates into 2 large branches.  There is diffuse disease throughout the vessel.  The LAD after D1 has a focal 70% stenosis.  Then in the mid vessel there is a bend the segment that starts with 30% followed by 70% and then 60%. Relatively normal right heart cath numbers with mean PAP of 18 mmHg and  PCWP of 13 mmHg. Normal Cardiac Output and  Index by Fick: 6.37-3.08.     RECOMMENDATIONS Discharge to home after PACU;  Recommend outpatient CVTS consultation => patient has moderate MR on Echo and Multivessel Disease, best served with CABG/AVR; multiple lesions and likely incomplete revascularization if multivessel PCI is the option chosen.   Patient Profile     63 y.o. female with a history of multivessel CAD noted on cardiac catheterization on 10/01/2023, paroxysmal atrial fibrillation on Eliquis , PAD, COPD, hypertension, hyperlipidemia, type 2 diabetes mellitus, and significant tobacco abuse. Patient underwent an outpatient cardiac catheterization earlier this month which showed severe multivessel. Patient was referred to CT surgery as an outpatient and was scheduled to see Dr. Deloise Ferries on 10/29/2023. However, she presented to the ED on 10/25/2023 with chest pain and was admitted for unstable angina.   Assessment & Plan    Unstable Angina CAD Cardiac catheterization earlier this month showed severe 3 vessel CAD. She was pending outpatient CT surgery evaluation but presented back to the ED on 10/25/2023 for worsening refractory atypical chest pain. EKG showed diffuse ST depressions in inferior and lateral leads. High-sensitivity troponin 34 >> 45. She was initially started on IV Heparin  but then this was stopped due to a drop in hemoglobin. She has now been seen by CT Surgery and plan is for CABG/ AVR with atrial clipping and possible MAZE next week (5/6 or 5/7).  - She reports some chest pain mostly at night and very slight chest tightness this morning. She also reports a little shortness of breath this morning.  - She also has some lower extremity edema. Will increase Lasix  to 40mg  daily.  - Continue Aspirin  81mg  daily.  - Continue Imdur  60mg  daily. => We will increase to 120 mg temporarily due to increased requirement for nitroglycerin  sublingual, if not able to keep her chest pain-free, will  convert to IV nitroglycerin . -> Will also increase diltiazem  to 180 mg for additional antianginal benefit - Continue Lipitor 80mg  daily and Zetia  10mg  daily.   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 She now presents with anemia and positive hemoccult. She reports some dark tarry stools intermittently over the past couple of months. She has been seen by GI in the past. Colonoscopy was recommended in 2023 but never completed. GI consulted this admission - ideally needs endoscopy but CT Surgery is ok with deferring this until after CABG/ SAVR in light of stable hemoglobin. Hematology-Oncology was curbsided on 4/30 and recommended Venofer  300mg  x2 which she has now completed.  - Hemoglobin has ranged from 8.3 to 9.7 this admission. Stable at 9.0 yesterday. - Continue Hydroxyurea .  - Will repeat CBC today.  Paroxysmal Atrial Fibrillation She had some episodes of paroxysmal atrial fibrillation in the ED.  - Currently in normal sinus rhythm.  - Continue Diltiazem  120mg  daily.  - Home Eliquis  on hold in anticipation of CABG. She was initially started on IV Heparin  but this was held giving drop in hemoglobin and positive hemoccult. Given she is maintaining sinus rhythm, will hold off on restarting IV Heparin  so we do not have any worsening anemia prior to surgery. If she were to have recurrent episodes of A-fib, then would we start IV heparin   Moderate Aortic Stenosis - Plan is for SAVR at time of CABG.  PAD Patient has bilateral lower extremity claudication. ABIs in 08/2023 were moderately reduced bilaterally (0.53 on the right and 0.59 on the left). Arterial ultrasound showed bilateral SFA disease.  -  Plan is for further management of this after CABG/ SAVR.  COPD  Chronic O2 Dependence Chronically on 3L of O2 at home.  - Stable. No wheezing.  Hypertension BP mildly elevated at times.  - Increase Diltiazem  to 180mg  daily.   - Continue Imdur  60mg  daily.  - Continue Lisinopril  20mg  daily. Will need to hold this 48 hours prior to CABG to reduce risk of vasoplegia.   Hyperlipidemia Lipid panel in 08/2023: Total Cholesterol 115, Triglycerides 108, HDL 38, LDL 57.  - Continue Lipitor 80mg  daily, Zetia  10mg  daily, and Fenofibrate  160mg  daily.   Type 2 Diabetes Mellitus Peripheral Neuropathy Hemoglobin A1c 5.1% this admission. - Continue sliding scale insulin .  - Continue home Duloxetine  60mg  twice daily to help with neuropathy.  Depression - Continue home Wellbutrin . & Duloxetine    Constipation Patient reports she has not had a bowel movement since she has been here and she usually has a bowel movement 2-3 times a day.  - She was given a dose of Colace yesterday but no bowel movement yet.  - Will discuss with Pharmacy and adjust bowel regimen.   Tobacco Abuse Patient has a 100 pack year smoking history.  - She has been counseled on the importance of complete cessation.    For questions or updates, please contact [facility] Please consult [email or web address] for contact info under        Signed, Callie E Goodrich, PA-C  10/29/2023, 11:21 AM     ATTENDING ATTESTATION  I have seen, examined and evaluated the patient this morning on rounds along with Callie Goodrich, PA.  After reviewing all the available data and chart, we discussed the patients laboratory, study & physical findings as well as symptoms in detail.  I agree with her findings, examination as well as impression recommendations as per our discussion.    Attending adjustments noted in italics.   Relatively stable, but more episodes of intermittent chest discomfort we will therefore increase her Imdur  dose and diltiazem  for additional antianginal benefit. Low threshold to convert back to IV heparin  and/or IV nitroglycerin  were symptoms worsen, or where she to have recurrent A-fib.  However, we are trying to avoid overhydration with IV  fluids and additional anticoagulation to avoid further bleeding.  At this point I do think it is most appropriate for her to stay in the hospital and continue to monitor her for recurrent unstable anginal symptoms up until her CABG/SAVR.  Discussed with Dr. Lightfoot and there is a potential that this could be pushed up 1 day earlier to 11/02/2023.    Arleen Lacer, MD, MS Randene Bustard, M.D., M.S. Interventional Chartered certified accountant  Pager # [phone]

## 2023-10-29 NOTE — Plan of Care (Signed)
  Problem: Metabolic: Goal: Ability to maintain appropriate glucose levels will improve Outcome: Not Progressing   Problem: Tissue Perfusion: Goal: Adequacy of tissue perfusion will improve Outcome: Not Progressing   Problem: Cardiac: Goal: Ability to achieve and maintain adequate cardiovascular perfusion will improve Outcome: Not Progressing   Problem: Clinical Measurements: Goal: Diagnostic test results will improve Outcome: Not Progressing   Problem: Clinical Measurements: Goal: Respiratory complications will improve Outcome: Not Progressing   Problem: Clinical Measurements: Goal: Cardiovascular complication will be avoided Outcome: Not Progressing

## 2023-10-29 NOTE — Progress Notes (Signed)
   10/29/23 1631  Vitals  BP (!) 157/61  MAP (mmHg) 89  BP Location Left Arm  BP Method Automatic  Patient Position (if appropriate) Sitting  Pulse Rate 70  ECG Heart Rate 69  Resp 18  Level of Consciousness  Level of Consciousness Alert  MEWS COLOR  MEWS Score Color Green  Oxygen  Therapy  SpO2 98 %  O2 Device Nasal Cannula  O2 Flow Rate (L/min) 3 L/min  Pain Assessment  Pain Scale 0-10  Pain Score 5  Pain Type Acute pain  Pain Location Chest  Pain Intervention(s) Medication (See eMAR)  MEWS Score  MEWS Temp 0  MEWS Systolic 0  MEWS Pulse 0  MEWS RR 0  MEWS LOC 0  MEWS Score 0   Patient called c/o CP rated 5/10 radiating to neck and left arm. Nitroglycerin  SL x1 Dose given with relieve. EKG done.Dr Addie Holstein made aware.

## 2023-10-29 NOTE — Progress Notes (Addendum)
 I was paged due to complaints of chest pain that was relieved after 1 dose of sublingual nitroglycerin .  This is a patient that is pending CABG/AVR.  Continue to treat with as needed nitroglycerin  if needed blood pressure tolerates could consider nitro drip.  EKG without any new acute ST-T wave changes.  See addendum to rounding note: Have increased diltiazem  to 180 mg daily and Imdur  220 mg daily for additional antianginal benefit pending her CABG.  Agree, if she continues to have chest pain intermittently despite increased dose of Imdur , then would convert to IV nitroglycerin , however trying to avoid IV fluids.   Randene Bustard, MD

## 2023-10-29 NOTE — Progress Notes (Signed)
   10/29/23 1551  TOC Brief Assessment  Insurance and Status Reviewed  Patient has primary care physician Yes  Home environment has been reviewed home  Prior level of function: self, has home 02 at baseline  Prior/Current Home Services No current home services  Social Drivers of Health Review SDOH reviewed no interventions necessary  Readmission risk has been reviewed Yes  Transition of care needs no transition of care needs at this time    Noted plan for CABG next week, tentatively set for 5/7, We will continue to monitor patient advancement through interdisciplinary progression rounds. If new patient transition needs arise, please place a TOC consult.

## 2023-10-30 DIAGNOSIS — I200 Unstable angina: Secondary | ICD-10-CM | POA: Diagnosis not present

## 2023-10-30 LAB — CBC
HCT: 29.1 % — ABNORMAL LOW (ref 36.0–46.0)
Hemoglobin: 8.3 g/dL — ABNORMAL LOW (ref 12.0–15.0)
MCH: 26.1 pg (ref 26.0–34.0)
MCHC: 28.5 g/dL — ABNORMAL LOW (ref 30.0–36.0)
MCV: 91.5 fL (ref 80.0–100.0)
Platelets: 428 10*3/uL — ABNORMAL HIGH (ref 150–400)
RBC: 3.18 MIL/uL — ABNORMAL LOW (ref 3.87–5.11)
RDW: 19.8 % — ABNORMAL HIGH (ref 11.5–15.5)
WBC: 13.9 10*3/uL — ABNORMAL HIGH (ref 4.0–10.5)
nRBC: 0.4 % — ABNORMAL HIGH (ref 0.0–0.2)

## 2023-10-30 LAB — BASIC METABOLIC PANEL WITH GFR
Anion gap: 6 (ref 5–15)
BUN: 23 mg/dL (ref 8–23)
CO2: 28 mmol/L (ref 22–32)
Calcium: 8.6 mg/dL — ABNORMAL LOW (ref 8.9–10.3)
Chloride: 105 mmol/L (ref 98–111)
Creatinine, Ser: 0.78 mg/dL (ref 0.44–1.00)
GFR, Estimated: >60 mL/min (ref >60)
Glucose, Bld: 159 mg/dL — ABNORMAL HIGH (ref 70–99)
Potassium: 4.1 mmol/L (ref 3.5–5.1)
Sodium: 139 mmol/L (ref 135–145)

## 2023-10-30 LAB — GLUCOSE, CAPILLARY
Glucose-Capillary: 141 mg/dL — ABNORMAL HIGH (ref 70–99)
Glucose-Capillary: 123 mg/dL — ABNORMAL HIGH (ref 70–99)
Glucose-Capillary: 172 mg/dL — ABNORMAL HIGH (ref 70–99)
Glucose-Capillary: 164 mg/dL — ABNORMAL HIGH (ref 70–99)

## 2023-10-30 NOTE — Progress Notes (Signed)
 Mobility Specialist Progress Note:    10/30/23 1401  Mobility  Activity Ambulated with assistance in hallway  Level of Assistance Contact guard assist, steadying assist  Assistive Device Front wheel walker  Distance Ambulated (ft) 300 ft  Activity Response Tolerated well  Mobility Referral Yes  Mobility visit 1 Mobility  Mobility Specialist Start Time (ACUTE ONLY) 1050  Mobility Specialist Stop Time (ACUTE ONLY) 1102  Mobility Specialist Time Calculation (min) (ACUTE ONLY) 12 min   Received pt in bed having no complaints and agreeable to mobility. Pt was asymptomatic throughout ambulation and returned to room w/o fault. Left in chair w/ call bell in reach and all needs met.   Inetta Manes Mobility Specialist  Please contact vis Secure Chat or  Rehab Office [phone]

## 2023-10-30 NOTE — Progress Notes (Signed)
 Rounding Note    Patient Name: Katelyn Cline Date of Encounter: 10/30/2023  [facility] Cardiologist: Lauro Portal, MD   Subjective   Had 1 episode of CP yesterday afternoon. No significant episodes overnight or this morning. Doing relatively well. No new or acute complaints.  Inpatient Medications    Scheduled Meds:  aspirin  EC  81 mg Oral Daily   atorvastatin   80 mg Oral QHS   bisacodyl   5 mg Oral Daily   buPROPion  ER  100 mg Oral BID   diltiazem   180 mg Oral QHS   docusate sodium   200 mg Oral BID   DULoxetine   60 mg Oral BID   ezetimibe   10 mg Oral q morning   fenofibrate   160 mg Oral Daily   furosemide   20 mg Oral Daily   hydroxyurea   1,000 mg Oral Once per day on Sunday Tuesday Thursday Saturday   And   hydroxyurea   1,500 mg Oral Once per day on Monday Wednesday Friday   insulin  aspart  0-15 Units Subcutaneous TID WC   insulin  aspart  0-5 Units Subcutaneous QHS   isosorbide  mononitrate  120 mg Oral QHS   lisinopril   20 mg Oral QHS   nicotine   21 mg Transdermal Daily   polyethylene glycol  17 g Oral Daily   senna  1 tablet Oral BID   Continuous Infusions:  PRN Meds: magnesium  hydroxide, nitroGLYCERIN , ondansetron  (ZOFRAN ) IV, oxyCODONE -acetaminophen  **AND** oxyCODONE    Vital Signs    Vitals:   10/30/23 0324 10/30/23 0800 10/30/23 0818 10/30/23 0935  BP: (!) 118/47  (!) 121/48   Pulse: (!) 55 67 67 (!) 53  Resp: 18 19 20 20   Temp: 98.4 F (36.9 C)  98.4 F (36.9 C)   TempSrc: Oral  Axillary   SpO2: 98% 100% 96% 98%  Weight:      Height:       No intake or output data in the 24 hours ending 10/30/23 1111     10/25/2023   12:32 AM 10/20/2023   10:41 AM 10/01/2023    8:51 AM  Last 3 Weights  Weight (lbs) 207 lb 207 lb 6.4 oz 205 lb  Weight (kg) 93.895 kg 94.076 kg 92.987 kg      Telemetry    Sinus brady and competing junctional rhythm - Personally Reviewed  Physical Exam   General: Well developed, in no acute distress.  Neck: No  JVD.  Cardiac: Normal rate, regular rhythm.  Resp: Normal work of breathing.  Ext: 1+ edema bilaterally.  Neuro: No gross focal deficits.  Psych: Normal affect.   Labs    High Sensitivity Troponin:   Recent Labs  Lab 10/25/23 0025 10/25/23 0220  TROPONINIHS 34* 45*     Chemistry Recent Labs  Lab 10/28/23 0359 10/29/23 1222 10/30/23 0441  NA 139 141 139  K 4.1 4.5 4.1  CL 104 106 105  CO2 26 25 28   GLUCOSE 138* 120* 159*  BUN 24* 19 23  CREATININE 0.68 0.74 0.78  CALCIUM  9.3 9.5 8.6*  PROT 6.0*  --   --   ALBUMIN 3.1*  --   --   AST 14*  --   --   ALT 16  --   --   ALKPHOS 62  --   --   BILITOT 0.6  --   --   GFRNONAA >60 >60 >60  ANIONGAP 9 10 6     Lipids No results for input(s): "CHOL", "TRIG", "HDL", "LABVLDL", "  LDLCALC", "CHOLHDL" in the last 168 hours.  Hematology Recent Labs  Lab 10/28/23 0359 10/29/23 1222 10/30/23 0441  WBC 11.3* 13.9* 13.9*  RBC 3.48* 3.71* 3.18*  HGB 9.0* 9.5* 8.3*  HCT 31.3* 33.5* 29.1*  MCV 89.9 90.3 91.5  MCH 25.9* 25.6* 26.1  MCHC 28.8* 28.4* 28.5*  RDW 19.2* 19.7* 19.8*  PLT 440* 494* 428*   Thyroid  No results for input(s): "TSH", "FREET4" in the last 168 hours.  BNPNo results for input(s): "BNP", "PROBNP" in the last 168 hours.  DDimer No results for input(s): "DDIMER" in the last 168 hours.   Radiology    No results found.  Cardiac Studies   Right/ Left Cardiac Catheterization 10/01/2023:   Mid LM to Ost LAD lesion is 45% stenosed.   1st Diag lesion is 90% stenosed.   Mid LAD-1 lesion is 70% stenosed.   Mid LAD-2 lesion is 30% stenosed. Mid LAD to Dist LAD lesion is 70% stenosed. Dist LAD lesion is 40% stenosed.   Prox Cx lesion is 80% stenosed (essentially the ostium of the AV groove portion of the Cx).   1st Mrg lesion is 90% stenosed.   .   Dist Cx lesion is 60% stenosed just prior to 2nd Mrg & 3rd Mrg lesion is 80% stenosed.   Prox RCA to Dist RCA lesion is 100% stenosed.  Retrograde flow from septal  collaterals fills the PDA with retrograde flow to the occlusion site.   LV end diastolic pressure is low.   Dominance: Right  POST-OPERATIVE DIAGNOSIS:  Mean AoV gradient 19 mmHg with systemic hypertension Multivessel disease: Proximal RCA on percent CTO with the distal portion filling via left-to-right collaterals mostly the PDA.  (Cannot exclude codominant RCA) Distal left main eccentric calcified 40 proximately 50% stenosis of the vessel bifurcates into the LAD and LCx LCx bifurcates quickly into the AV groove and large OM1:  OM1 has proximal 90% stenosis and is a large-caliber vessel Ostial AVG-LCx has 80% stenosis and gives rise to small caliber LPL 1(OM2 on image) and LPL 3 (on image LPL1) and large caliber LPL 2 (on image OM3).  60% stenosis just proximal to LPL 1 and then the LPL 2 continuation of the circumflex has 80% stenosis. LAD gives off a large caliber D1 that has a very proximal focal 90% stenosis before it bifurcates into 2 large branches.  There is diffuse disease throughout the vessel.  The LAD after D1 has a focal 70% stenosis.  Then in the mid vessel there is a bend the segment that starts with 30% followed by 70% and then 60%. Relatively normal right heart cath numbers with mean PAP of 18 mmHg and PCWP of 13 mmHg. Normal Cardiac Output and Index by Fick: 6.37-3.08.     RECOMMENDATIONS Discharge to home after PACU;  Recommend outpatient CVTS consultation => patient has moderate MR on Echo and Multivessel Disease, best served with CABG/AVR; multiple lesions and likely incomplete revascularization if multivessel PCI is the option chosen.   Assessment and Plan:      Katelyn Cline is a 63 y.o. female with a history of multivessel CAD noted on cardiac catheterization on 10/01/2023, paroxysmal atrial fibrillation on Eliquis , PAD, COPD, hypertension, hyperlipidemia, type 2 diabetes mellitus, and significant tobacco abuse. Patient underwent an outpatient cardiac  catheterization earlier this month which showed severe multivessel disease. Patient was referred to CT surgery as an outpatient and was scheduled to see Dr. Deloise Ferries on 10/29/2023. However, she presented to the ED on  10/25/2023 with unstable angina. CABG has been expedited, now planned for 11/03/23.   Unstable Angina CAD -She has now been seen by CT Surgery and plan is for CABG/ AVR with atrial clipping and possible MAZE next week (5/6 or 5/7).  - Continue Aspirin  81mg  daily.  - Continue anti-anginals as follows: Imdur  120mg  daily and diltiazem  180mg  daily. SL NTG as needed. Low threshold for IV NTG if recurrent CP.  - Continue Lipitor 80mg  daily and Zetia  10mg  daily.   Anemia with GI Bleed Polycythemia Vera - Hemoglobin has ranged from 8.3 to 9.7 this admission. - Continue Hydroxyurea .  - Will repeat CBC today. - IV heparin  on hold.   Paroxysmal Atrial Fibrillation - Continue Diltiazem  180mg  daily.  - Home Eliquis  on hold in anticipation of CABG. She was initially started on IV Heparin  but this is on hold given GI bleed.  Moderate Aortic Stenosis - Plan is for SAVR at time of CABG. - Continue lasix  20mg  daily.   PAD: Patient has bilateral lower extremity claudication. ABIs in 08/2023 were moderately reduced bilaterally (0.53 on the right and 0.59 on the left). Arterial ultrasound showed bilateral SFA disease.  - Plan is for further management of this after CABG/ SAVR. - Continue aspirin  and statin as above.   COPD  Chronic hypoxic respiratory failure:  3L of O2 at home.  - Stable. No wheezing.  Hypertension:  - Continue diltiazem  180mg  daily.  - Continue Imdur  120mg  daily.  - Continue Lisinopril  20mg  daily. Will need to hold this 48 hours prior to CABG to reduce risk of vasoplegia.   Hyperlipidemia: Lipid panel in 08/2023: Total Cholesterol 115, Triglycerides 108, HDL 38, LDL 57.  - Continue Lipitor 80mg  daily, Zetia  10mg  daily, and Fenofibrate  160mg  daily.   Type 2 Diabetes  Mellitus: Hemoglobin A1c 5.1% this admission. Peripheral Neuropathy - Continue sliding scale insulin .  - Continue home Duloxetine  60mg  twice daily to help with neuropathy.  Depression - Continue home Wellbutrin  & Duloxetine  .  Tobacco Abuse: Patient has a 100 pack year smoking history.  - She has been counseled on the importance of complete cessation. Nicotine  patch.   For questions or updates, please contact [facility] Please consult [email or web address] for contact info under    Signed, Ardeen Kohler, MD  10/30/2023, 11:11 AM

## 2023-10-30 NOTE — Plan of Care (Signed)
  Problem: Education: Goal: Ability to describe self-care measures that may prevent or decrease complications (Diabetes Survival Skills Education) will improve Outcome: Progressing   Problem: Coping: Goal: Ability to adjust to condition or change in health will improve Outcome: Progressing   Problem: Fluid Volume: Goal: Ability to maintain a balanced intake and output will improve Outcome: Progressing   Problem: Health Behavior/Discharge Planning: Goal: Ability to identify and utilize available resources and services will improve Outcome: Progressing Goal: Ability to manage health-related needs will improve Outcome: Progressing   Problem: Metabolic: Goal: Ability to maintain appropriate glucose levels will improve Outcome: Progressing   Problem: Nutritional: Goal: Maintenance of adequate nutrition will improve Outcome: Progressing Goal: Progress toward achieving an optimal weight will improve Outcome: Progressing   Problem: Skin Integrity: Goal: Risk for impaired skin integrity will decrease Outcome: Progressing   Problem: Tissue Perfusion: Goal: Adequacy of tissue perfusion will improve Outcome: Progressing   Problem: Education: Goal: Understanding of cardiac disease, CV risk reduction, and recovery process will improve Outcome: Progressing   Problem: Activity: Goal: Ability to tolerate increased activity will improve Outcome: Progressing

## 2023-10-30 NOTE — Plan of Care (Signed)
  Problem: Coping: Goal: Ability to adjust to condition or change in health will improve Outcome: Progressing   Problem: Health Behavior/Discharge Planning: Goal: Ability to identify and utilize available resources and services will improve Outcome: Progressing Goal: Ability to manage health-related needs will improve Outcome: Progressing   

## 2023-10-31 DIAGNOSIS — I200 Unstable angina: Secondary | ICD-10-CM | POA: Diagnosis not present

## 2023-10-31 LAB — CBC
HCT: 29.9 % — ABNORMAL LOW (ref 36.0–46.0)
Hemoglobin: 8.7 g/dL — ABNORMAL LOW (ref 12.0–15.0)
MCH: 26.9 pg (ref 26.0–34.0)
MCHC: 29.1 g/dL — ABNORMAL LOW (ref 30.0–36.0)
MCV: 92.3 fL (ref 80.0–100.0)
Platelets: 432 10*3/uL — ABNORMAL HIGH (ref 150–400)
RBC: 3.24 MIL/uL — ABNORMAL LOW (ref 3.87–5.11)
RDW: 20.8 % — ABNORMAL HIGH (ref 11.5–15.5)
WBC: 13.9 10*3/uL — ABNORMAL HIGH (ref 4.0–10.5)
nRBC: 0.4 % — ABNORMAL HIGH (ref 0.0–0.2)

## 2023-10-31 LAB — GLUCOSE, CAPILLARY
Glucose-Capillary: 146 mg/dL — ABNORMAL HIGH (ref 70–99)
Glucose-Capillary: 136 mg/dL — ABNORMAL HIGH (ref 70–99)
Glucose-Capillary: 133 mg/dL — ABNORMAL HIGH (ref 70–99)
Glucose-Capillary: 199 mg/dL — ABNORMAL HIGH (ref 70–99)
Glucose-Capillary: 137 mg/dL — ABNORMAL HIGH (ref 70–99)

## 2023-10-31 LAB — BASIC METABOLIC PANEL WITH GFR
Anion gap: 8 (ref 5–15)
BUN: 24 mg/dL — ABNORMAL HIGH (ref 8–23)
CO2: 28 mmol/L (ref 22–32)
Calcium: 9 mg/dL (ref 8.9–10.3)
Chloride: 103 mmol/L (ref 98–111)
Creatinine, Ser: 0.89 mg/dL (ref 0.44–1.00)
GFR, Estimated: >60 mL/min (ref >60)
Glucose, Bld: 155 mg/dL — ABNORMAL HIGH (ref 70–99)
Potassium: 4.2 mmol/L (ref 3.5–5.1)
Sodium: 139 mmol/L (ref 135–145)

## 2023-10-31 MED ORDER — FUROSEMIDE 40 MG PO TABS
40.0000 mg | ORAL_TABLET | Freq: Every day | ORAL | Status: DC
  Administered 2023-11-01 – 2023-11-02 (×2): 40 mg via ORAL
  Filled 2023-10-31 (×2): qty 1

## 2023-10-31 MED ORDER — FUROSEMIDE 10 MG/ML IJ SOLN
20.0000 mg | Freq: Once | INTRAMUSCULAR | Status: AC
  Administered 2023-10-31: 20 mg via INTRAVENOUS
  Filled 2023-10-31: qty 2

## 2023-10-31 NOTE — Progress Notes (Signed)
 Mobility Specialist Progress Note:    10/31/23 1501  Mobility  Activity Ambulated with assistance in hallway  Level of Assistance Standby assist, set-up cues, supervision of patient - no hands on  Assistive Device Front wheel walker  Distance Ambulated (ft) 350 ft  Activity Response Tolerated well  Mobility Referral Yes  Mobility visit 1 Mobility  Mobility Specialist Start Time (ACUTE ONLY) 1340  Mobility Specialist Stop Time (ACUTE ONLY) 1355  Mobility Specialist Time Calculation (min) (ACUTE ONLY) 15 min   Received pt in bed having no complaints and agreeable to mobility. Pt was asymptomatic during session. Ambulated on 3L/min VSS. Returned to room w/o fault. Left seated EOB w/ call bell in reach and all needs met.   Inetta Manes Mobility Specialist  Please contact vis Secure Chat or  Rehab Office [phone]

## 2023-10-31 NOTE — Progress Notes (Signed)
 Rounding Note    Patient Name: Katelyn Cline Date of Encounter: 10/31/2023  [facility] Cardiologist: Katelyn Portal, MD   Subjective   No acute overnight events. Reports doing relatively well this morning. No chest pain. No new or acute complaints.  Inpatient Medications    Scheduled Meds:  aspirin  EC  81 mg Oral Daily   atorvastatin   80 mg Oral QHS   bisacodyl   5 mg Oral Daily   buPROPion  ER  100 mg Oral BID   diltiazem   180 mg Oral QHS   docusate sodium   200 mg Oral BID   DULoxetine   60 mg Oral BID   ezetimibe   10 mg Oral q morning   fenofibrate   160 mg Oral Daily   furosemide   20 mg Oral Daily   hydroxyurea   1,000 mg Oral Once per day on Sunday Tuesday Thursday Saturday   And   hydroxyurea   1,500 mg Oral Once per day on Monday Wednesday Friday   insulin  aspart  0-15 Units Subcutaneous TID WC   insulin  aspart  0-5 Units Subcutaneous QHS   isosorbide  mononitrate  120 mg Oral QHS   lisinopril   20 mg Oral QHS   nicotine   21 mg Transdermal Daily   polyethylene glycol  17 g Oral Daily   senna  1 tablet Oral BID   Continuous Infusions:  PRN Meds: magnesium  hydroxide, nitroGLYCERIN , ondansetron  (ZOFRAN ) IV, oxyCODONE -acetaminophen  **AND** oxyCODONE    Vital Signs    Vitals:   10/30/23 1944 10/30/23 2323 10/31/23 0346 10/31/23 0740  BP: (!) 126/54 (!) 150/63 (!) 106/40 126/81  Pulse: 63 65 (!) 53 65  Resp: 18 16 15 19   Temp: 99.3 F (37.4 C) 97.8 F (36.6 C) 97.8 F (36.6 C) 98 F (36.7 C)  TempSrc: Oral Oral Oral Oral  SpO2: 99% 100% 97% 99%  Weight:      Height:        Intake/Output Summary (Last 24 hours) at 10/31/2023 1052 Last data filed at 10/30/2023 2200 Gross per 24 hour  Intake 480 ml  Output --  Net 480 ml       10/25/2023   12:32 AM 10/20/2023   10:41 AM 10/01/2023    8:51 AM  Last 3 Weights  Weight (lbs) 207 lb 207 lb 6.4 oz 205 lb  Weight (kg) 93.895 kg 94.076 kg 92.987 kg      Telemetry    Sinus brady and competing  junctional rhythm - Personally Reviewed  Physical Exam   General: Well developed, in no acute distress.  Neck: No JVD.  Cardiac: Normal rate, regular rhythm.  Resp: Normal work of breathing.  Ext: 1+ edema bilaterally.  Neuro: No gross focal deficits.  Psych: Normal affect.   Labs    High Sensitivity Troponin:   Recent Labs  Lab 10/25/23 0025 10/25/23 0220  TROPONINIHS 34* 45*     Chemistry Recent Labs  Lab 10/28/23 0359 10/29/23 1222 10/30/23 0441 10/31/23 0442  NA 139 141 139 139  K 4.1 4.5 4.1 4.2  CL 104 106 105 103  CO2 26 25 28 28   GLUCOSE 138* 120* 159* 155*  BUN 24* 19 23 24*  CREATININE 0.68 0.74 0.78 0.89  CALCIUM  9.3 9.5 8.6* 9.0  PROT 6.0*  --   --   --   ALBUMIN 3.1*  --   --   --   AST 14*  --   --   --   ALT 16  --   --   --  ALKPHOS 62  --   --   --   BILITOT 0.6  --   --   --   GFRNONAA >60 >60 >60 >60  ANIONGAP 9 10 6 8     Lipids No results for input(s): "CHOL", "TRIG", "HDL", "LABVLDL", "LDLCALC", "CHOLHDL" in the last 168 hours.  Hematology Recent Labs  Lab 10/29/23 1222 10/30/23 0441 10/31/23 0442  WBC 13.9* 13.9* 13.9*  RBC 3.71* 3.18* 3.24*  HGB 9.5* 8.3* 8.7*  HCT 33.5* 29.1* 29.9*  MCV 90.3 91.5 92.3  MCH 25.6* 26.1 26.9  MCHC 28.4* 28.5* 29.1*  RDW 19.7* 19.8* 20.8*  PLT 494* 428* 432*   Thyroid  No results for input(s): "TSH", "FREET4" in the last 168 hours.  BNPNo results for input(s): "BNP", "PROBNP" in the last 168 hours.  DDimer No results for input(s): "DDIMER" in the last 168 hours.   Radiology    No results found.  Cardiac Studies   Right/ Left Cardiac Catheterization 10/01/2023:   Mid LM to Ost LAD lesion is 45% stenosed.   1st Diag lesion is 90% stenosed.   Mid LAD-1 lesion is 70% stenosed.   Mid LAD-2 lesion is 30% stenosed. Mid LAD to Dist LAD lesion is 70% stenosed. Dist LAD lesion is 40% stenosed.   Prox Cx lesion is 80% stenosed (essentially the ostium of the AV groove portion of the Cx).   1st Mrg  lesion is 90% stenosed.   .   Dist Cx lesion is 60% stenosed just prior to 2nd Mrg & 3rd Mrg lesion is 80% stenosed.   Prox RCA to Dist RCA lesion is 100% stenosed.  Retrograde flow from septal collaterals fills the PDA with retrograde flow to the occlusion site.   LV end diastolic pressure is low.   Dominance: Right  POST-OPERATIVE DIAGNOSIS:  Mean AoV gradient 19 mmHg with systemic hypertension Multivessel disease: Proximal RCA on percent CTO with the distal portion filling via left-to-right collaterals mostly the PDA.  (Cannot exclude codominant RCA) Distal left main eccentric calcified 40 proximately 50% stenosis of the vessel bifurcates into the LAD and LCx LCx bifurcates quickly into the AV groove and large OM1:  OM1 has proximal 90% stenosis and is a large-caliber vessel Ostial AVG-LCx has 80% stenosis and gives rise to small caliber LPL 1(OM2 on image) and LPL 3 (on image LPL1) and large caliber LPL 2 (on image OM3).  60% stenosis just proximal to LPL 1 and then the LPL 2 continuation of the circumflex has 80% stenosis. LAD gives off a large caliber D1 that has a very proximal focal 90% stenosis before it bifurcates into 2 large branches.  There is diffuse disease throughout the vessel.  The LAD after D1 has a focal 70% stenosis.  Then in the mid vessel there is a bend the segment that starts with 30% followed by 70% and then 60%. Relatively normal right heart cath numbers with mean PAP of 18 mmHg and PCWP of 13 mmHg. Normal Cardiac Output and Index by Fick: 6.37-3.08.     RECOMMENDATIONS Discharge to home after PACU;  Recommend outpatient CVTS consultation => patient has moderate MR on Echo and Multivessel Disease, best served with CABG/AVR; multiple lesions and likely incomplete revascularization if multivessel PCI is the option chosen.   Assessment and Plan:      [name] is a 63 y.o. female with a history of multivessel CAD noted on cardiac catheterization on  10/01/2023, paroxysmal atrial fibrillation on Eliquis , PAD, COPD, hypertension, hyperlipidemia,  type 2 diabetes mellitus, and significant tobacco abuse. Patient underwent an outpatient cardiac catheterization earlier this month which showed severe multivessel disease. Patient was referred to CT surgery as an outpatient and was scheduled to see Dr. Deloise Ferries on 10/29/2023. However, she presented to the ED on 10/25/2023 with unstable angina. CABG has been expedited, now planned for 11/03/23.   Unstable Angina CAD -She has now been seen by CT Surgery and plan is for CABG/ AVR with atrial clipping and possible MAZE next week (5/6 or 5/7).  - Continue Aspirin  81mg  daily.  - Continue anti-anginals as follows: Imdur  120mg  daily and diltiazem  180mg  daily. SL NTG as needed. Low threshold for IV NTG if recurrent CP.  - Continue Lipitor 80mg  daily and Zetia  10mg  daily.   Anemia with GI Bleed Polycythemia Vera - Hemoglobin has ranged from 8.3 to 9.7 this admission. Stable today. - Continue Hydroxyurea .  - Continue to trend CBC daily. - IV heparin  on hold.   Paroxysmal Atrial Fibrillation - Continue Diltiazem  180mg  daily.  - Home Eliquis  on hold in anticipation of CABG. She was initially started on IV Heparin  but this is on hold given GI bleed.  Moderate Aortic Stenosis - Plan is for SAVR at time of CABG. - Gentle diuresis - lasix  IV 20mg  today, then oral 40mg  daily starting tomorrow   PAD: Patient has bilateral lower extremity claudication. ABIs in 08/2023 were moderately reduced bilaterally (0.53 on the right and 0.59 on the left). Arterial ultrasound showed bilateral SFA disease.  - Plan is for further management of this after CABG/ SAVR. - Continue aspirin  and statin as above.   COPD  Chronic hypoxic respiratory failure:  3L of O2 at home.  - Stable. No wheezing.  Hypertension:  - Continue diltiazem  180mg  daily.  - Continue Imdur  120mg  daily.  - Continue Lisinopril  20mg  daily. Will need to hold  this 48 hours prior to CABG to reduce risk of vasoplegia.   Hyperlipidemia: Lipid panel in 08/2023: Total Cholesterol 115, Triglycerides 108, HDL 38, LDL 57.  - Continue Lipitor 80mg  daily, Zetia  10mg  daily, and Fenofibrate  160mg  daily.   Type 2 Diabetes Mellitus: Hemoglobin A1c 5.1% this admission. Peripheral Neuropathy - Continue sliding scale insulin .  - Continue home Duloxetine  60mg  twice daily to help with neuropathy.  Depression - Continue home Wellbutrin  & Duloxetine  .  Tobacco Abuse: Patient has a 100 pack year smoking history.  - She has been counseled on the importance of complete cessation. Nicotine  patch.   For questions or updates, please contact [facility] Please consult [email or web address] for contact info under    Signed, Ardeen Kohler, MD  10/31/2023, 10:52 AM

## 2023-10-31 NOTE — Progress Notes (Signed)
 E-link RN contacted me that pt's camera in room is blocked. Pt refused to be monitor and didn't want the camera unblocked. Karle Ovens RN notified, and said it's ok since pt is not CCM pt.

## 2023-10-31 NOTE — Plan of Care (Signed)
  Problem: Fluid Volume: Goal: Ability to maintain a balanced intake and output will improve Outcome: Progressing   Problem: Coping: Goal: Ability to adjust to condition or change in health will improve Outcome: Progressing   Problem: Skin Integrity: Goal: Risk for impaired skin integrity will decrease Outcome: Progressing   Problem: Tissue Perfusion: Goal: Adequacy of tissue perfusion will improve Outcome: Progressing   Problem: Education: Goal: Knowledge of General Education information will improve Description: Including pain rating scale, medication(s)/side effects and non-pharmacologic comfort measures Outcome: Progressing

## 2023-11-01 DIAGNOSIS — I200 Unstable angina: Secondary | ICD-10-CM | POA: Diagnosis not present

## 2023-11-01 DIAGNOSIS — I480 Paroxysmal atrial fibrillation: Secondary | ICD-10-CM | POA: Diagnosis not present

## 2023-11-01 LAB — BASIC METABOLIC PANEL WITH GFR
Anion gap: 8 (ref 5–15)
BUN: 24 mg/dL — ABNORMAL HIGH (ref 8–23)
CO2: 28 mmol/L (ref 22–32)
Calcium: 9.1 mg/dL (ref 8.9–10.3)
Chloride: 103 mmol/L (ref 98–111)
Creatinine, Ser: 0.81 mg/dL (ref 0.44–1.00)
GFR, Estimated: >60 mL/min (ref >60)
Glucose, Bld: 129 mg/dL — ABNORMAL HIGH (ref 70–99)
Potassium: 4.2 mmol/L (ref 3.5–5.1)
Sodium: 139 mmol/L (ref 135–145)

## 2023-11-01 LAB — CBC
HCT: 32.1 % — ABNORMAL LOW (ref 36.0–46.0)
Hemoglobin: 9.1 g/dL — ABNORMAL LOW (ref 12.0–15.0)
MCH: 26.7 pg (ref 26.0–34.0)
MCHC: 28.3 g/dL — ABNORMAL LOW (ref 30.0–36.0)
MCV: 94.1 fL (ref 80.0–100.0)
Platelets: 433 10*3/uL — ABNORMAL HIGH (ref 150–400)
RBC: 3.41 MIL/uL — ABNORMAL LOW (ref 3.87–5.11)
RDW: 22.2 % — ABNORMAL HIGH (ref 11.5–15.5)
WBC: 12.2 10*3/uL — ABNORMAL HIGH (ref 4.0–10.5)
nRBC: 0.4 % — ABNORMAL HIGH (ref 0.0–0.2)

## 2023-11-01 LAB — GLUCOSE, CAPILLARY
Glucose-Capillary: 119 mg/dL — ABNORMAL HIGH (ref 70–99)
Glucose-Capillary: 155 mg/dL — ABNORMAL HIGH (ref 70–99)
Glucose-Capillary: 121 mg/dL — ABNORMAL HIGH (ref 70–99)
Glucose-Capillary: 136 mg/dL — ABNORMAL HIGH (ref 70–99)

## 2023-11-01 NOTE — Progress Notes (Signed)
 CARDIAC REHAB PHASE I      Pre-op OHS education including OHS booklet, OHS handout, IS use, mobility importance, home needs at discharge and sternal precautions/move in the tube reviewed. All questions and concerns addressed. Will continue to follow.  [id number] Ronny Colas, RN BSN 11/01/2023 12:09 PM

## 2023-11-01 NOTE — Plan of Care (Signed)
  Problem: Coping: Goal: Ability to adjust to condition or change in health will improve Outcome: Progressing   Problem: Fluid Volume: Goal: Ability to maintain a balanced intake and output will improve Outcome: Progressing   Problem: Metabolic: Goal: Ability to maintain appropriate glucose levels will improve Outcome: Progressing   Problem: Nutritional: Goal: Maintenance of adequate nutrition will improve Outcome: Progressing   Problem: Skin Integrity: Goal: Risk for impaired skin integrity will decrease Outcome: Progressing   Problem: Tissue Perfusion: Goal: Adequacy of tissue perfusion will improve Outcome: Progressing   Problem: Activity: Goal: Ability to tolerate increased activity will improve Outcome: Progressing   Problem: Cardiac: Goal: Ability to achieve and maintain adequate cardiovascular perfusion will improve Outcome: Progressing   Problem: Health Behavior/Discharge Planning: Goal: Ability to safely manage health-related needs after discharge will improve Outcome: Progressing   Problem: Education: Goal: Knowledge of General Education information will improve Description: Including pain rating scale, medication(s)/side effects and non-pharmacologic comfort measures Outcome: Progressing   Problem: Clinical Measurements: Goal: Ability to maintain clinical measurements within normal limits will improve Outcome: Progressing Goal: Will remain free from infection Outcome: Progressing Goal: Diagnostic test results will improve Outcome: Progressing Goal: Respiratory complications will improve Outcome: Progressing Goal: Cardiovascular complication will be avoided Outcome: Progressing   Problem: Activity: Goal: Risk for activity intolerance will decrease Outcome: Progressing   Problem: Nutrition: Goal: Adequate nutrition will be maintained Outcome: Progressing   Problem: Elimination: Goal: Will not experience complications related to bowel  motility Outcome: Progressing Goal: Will not experience complications related to urinary retention Outcome: Progressing   Problem: Pain Managment: Goal: General experience of comfort will improve and/or be controlled Outcome: Progressing   Problem: Safety: Goal: Ability to remain free from injury will improve Outcome: Progressing   Problem: Skin Integrity: Goal: Risk for impaired skin integrity will decrease Outcome: Progressing

## 2023-11-01 NOTE — Progress Notes (Addendum)
 Patient Name: Katelyn Cline Date of Encounter: 11/01/2023 [facility] Cardiologist: Lauro Portal, MD   Interval Summary  .    Patient reports feeling well. No acute events overnight Currently on her baseline oxygen  requirement of 3 L Denies any chest pain Scheduled for CABG, AVR, LAA clipping, MAZE on 5/7 with Dr. Deloise Ferries   Vital Signs .    Vitals:   10/31/23 2214 11/01/23 0005 11/01/23 0444 11/01/23 0824  BP: (!) 145/51 (!) 116/49 (!) 104/46 102/88  Pulse:  63 (!) 47 (!) 57  Resp:  19 18 18   Temp:  98.5 F (36.9 C) 97.7 F (36.5 C) 97.8 F (36.6 C)  TempSrc:  Oral Oral Oral  SpO2:  95% 98% 98%  Weight:      Height:        Intake/Output Summary (Last 24 hours) at 11/01/2023 0928 Last data filed at 10/31/2023 2214 Gross per 24 hour  Intake 720 ml  Output --  Net 720 ml      10/25/2023   12:32 AM 10/20/2023   10:41 AM 10/01/2023    8:51 AM  Last 3 Weights  Weight (lbs) 207 lb 207 lb 6.4 oz 205 lb  Weight (kg) 93.895 kg 94.076 kg 92.987 kg     Telemetry/ECG    Sinus bradycardia, HR 50s, PVCs - Personally Reviewed  Physical Exam .   GEN: No acute distress, on baseline 3 L oxygen  via [place].   Neck: No JVD Cardiac: bradycardic, 2/6 SEM at RUSB.  Respiratory: Clear to auscultation bilaterally. GI: Soft, nontender, non-distended  MS: trace LE edema  Assessment & Plan .   Katelyn Cline is a 63 y.o. female with a history of multivessel CAD noted on cardiac catheterization on 10/01/2023, PAF on Eliquis , PAD, COPD, HTN, HLD, T2DM, and significant tobacco abuse. Patient underwent an outpatient cardiac catheterization earlier this month which showed severe multivessel disease. Patient was referred to CT surgery as an outpatient and was scheduled to see Dr. Deloise Ferries on 10/29/2023. However, she presented to the ED on 10/25/2023 with unstable angina. CABG has been expedited, now planned for 11/03/23.    Unstable angina Severe multivessel CAD, scheduled for CABG  11/03/2023 Hypertension Hyperlipidemia  Seen by CT surgery, plan is for CABG/AVR with atrial clipping and possible MAZE  Continue ASA 81 mg daily Continue Imdur  120 mg daily Continue diltiazem  180 mg daily Continue SL nitroglycerin  PRN Continue Lipitor 80 mg daily Continue Zetia  10 mg daily  Holding Lisinopril  48 hours prior to CABG -- last dose 5/4 at 10:14 PM  Paroxysmal atrial fibrillation Currently in sinus rhythm with HR 50s Continue diltiazem  180 mg daily Home Eliquis  was held in setting of upcoming CABG  Started on IV heparin  when holding Eliquis  but then held in the setting of GI bleed  Moderate aortic stenosis  Patient planning to undergo SAVR at time of her CABG  Continue PO Lasix  40 mg daily   PAD Patient reports bilateral LE claudication ABI in 08/2023 were moderately reduced bilaterally (R = 0.53, L = 0.59) Arterial U/S showed bilateral SFA disease Continue ASA 81 mg daily Continue Lipitor 80 mg daily Plan for further management post-op  Per primary Tobacco abuse Diabetes Peripheral neuropathy Depression COPD Anemia with GI bleed Polycythemia vera   For questions or updates, please contact [facility] Please consult [email or web address] for contact info under       Signed, Jiles Mote, PA-C   I have personally seen and examined this  patient. I agree with the assessment and plan as outlined above.  Pt was found to have severe multi-vessel CAD by cath 10/01/23. Readmitted while waiting on outpatient CT surgery consult.  No chest pain this am.  Labs reviewed Telemetry with sinus, reviewed by me My exam: NAD, CV:RRR Lungs:clear bilaterally  Ext: no LE edema Plan: CABG on 11/02/21 with AVR, MAZE and LAA clipping.  No changes today. Continue ASA, Imdur , statin, Zetia  Her Eliquis  is on hold. She remains on IV heparin .   Antoinette Batman, MD, [facility] 11/01/2023 11:14 AM

## 2023-11-02 ENCOUNTER — Inpatient Hospital Stay (HOSPITAL_COMMUNITY)

## 2023-11-02 DIAGNOSIS — I200 Unstable angina: Secondary | ICD-10-CM | POA: Diagnosis not present

## 2023-11-02 DIAGNOSIS — I480 Paroxysmal atrial fibrillation: Secondary | ICD-10-CM | POA: Diagnosis not present

## 2023-11-02 LAB — SURGICAL PCR SCREEN
MRSA, PCR: NEGATIVE
Staphylococcus aureus: NEGATIVE

## 2023-11-02 LAB — GLUCOSE, CAPILLARY
Glucose-Capillary: 136 mg/dL — ABNORMAL HIGH (ref 70–99)
Glucose-Capillary: 158 mg/dL — ABNORMAL HIGH (ref 70–99)
Glucose-Capillary: 182 mg/dL — ABNORMAL HIGH (ref 70–99)
Glucose-Capillary: 170 mg/dL — ABNORMAL HIGH (ref 70–99)

## 2023-11-02 LAB — PREPARE RBC (CROSSMATCH)

## 2023-11-02 MED ORDER — HEPARIN 30,000 UNITS/1000 ML (OHS) CELLSAVER SOLUTION
Status: DC
  Filled 2023-11-02: qty 1000

## 2023-11-02 MED ORDER — METOPROLOL TARTRATE 12.5 MG HALF TABLET: 12.5000 mg | ORAL_TABLET | Freq: Once | ORAL | Status: DC

## 2023-11-02 MED ORDER — NOREPINEPHRINE 4 MG/250ML-% IV SOLN
0.0000 ug/min | INTRAVENOUS | Status: AC
  Administered 2023-11-03: 2 ug/min via INTRAVENOUS
  Filled 2023-11-02: qty 250

## 2023-11-02 MED ORDER — MILRINONE LACTATE IN DEXTROSE 20-5 MG/100ML-% IV SOLN
0.3000 ug/kg/min | INTRAVENOUS | Status: DC
  Filled 2023-11-02: qty 100

## 2023-11-02 MED ORDER — POTASSIUM CHLORIDE 2 MEQ/ML IV SOLN
80.0000 meq | INTRAVENOUS | Status: DC
  Filled 2023-11-02: qty 40

## 2023-11-02 MED ORDER — EPINEPHRINE HCL 5 MG/250ML IV SOLN IN NS
0.0000 ug/min | INTRAVENOUS | Status: DC
  Filled 2023-11-02: qty 250

## 2023-11-02 MED ORDER — CHLORHEXIDINE GLUCONATE CLOTH 2 % EX PADS
6.0000 | MEDICATED_PAD | Freq: Once | CUTANEOUS | Status: AC
  Administered 2023-11-02: 6 via TOPICAL

## 2023-11-02 MED ORDER — INSULIN REGULAR(HUMAN) IN NACL 100-0.9 UT/100ML-% IV SOLN
INTRAVENOUS | Status: AC
Start: 2023-11-03 — End: 2023-11-04
  Administered 2023-11-03: 2.8 [IU]/h via INTRAVENOUS
  Filled 2023-11-02: qty 100

## 2023-11-02 MED ORDER — BISACODYL 5 MG PO TBEC
5.0000 mg | DELAYED_RELEASE_TABLET | Freq: Once | ORAL | Status: AC
  Administered 2023-11-02: 5 mg via ORAL
  Filled 2023-11-02: qty 1

## 2023-11-02 MED ORDER — VANCOMYCIN HCL 1.5 G IV SOLR
1500.0000 mg | INTRAVENOUS | Status: AC
  Administered 2023-11-03: 1500 mg via INTRAVENOUS
  Filled 2023-11-02: qty 30

## 2023-11-02 MED ORDER — MUPIROCIN 2 % EX OINT
1.0000 | TOPICAL_OINTMENT | Freq: Two times a day (BID) | CUTANEOUS | Status: DC
  Administered 2023-11-02: 1 via NASAL
  Filled 2023-11-02: qty 22

## 2023-11-02 MED ORDER — TRANEXAMIC ACID (OHS) PUMP PRIME SOLUTION
2.0000 mg/kg | INTRAVENOUS | Status: DC
  Filled 2023-11-02: qty 1.88

## 2023-11-02 MED ORDER — TRANEXAMIC ACID 1000 MG/10ML IV SOLN
1.5000 mg/kg/h | INTRAVENOUS | Status: AC
  Administered 2023-11-03: 1.5 mg/kg/h via INTRAVENOUS
  Filled 2023-11-02: qty 25

## 2023-11-02 MED ORDER — PHENYLEPHRINE HCL-NACL 20-0.9 MG/250ML-% IV SOLN
30.0000 ug/min | INTRAVENOUS | Status: AC
Start: 2023-11-03 — End: 2023-11-04
  Administered 2023-11-03: 20 ug/min via INTRAVENOUS
  Filled 2023-11-02: qty 250

## 2023-11-02 MED ORDER — PLASMA-LYTE A IV SOLN
INTRAVENOUS | Status: DC
  Filled 2023-11-02: qty 2.5

## 2023-11-02 MED ORDER — TEMAZEPAM 15 MG PO CAPS
15.0000 mg | ORAL_CAPSULE | Freq: Once | ORAL | Status: AC | PRN
  Administered 2023-11-02: 15 mg via ORAL
  Filled 2023-11-02: qty 1

## 2023-11-02 MED ORDER — MANNITOL 20 % IV SOLN
INTRAVENOUS | Status: DC
  Filled 2023-11-02: qty 13

## 2023-11-02 MED ORDER — CHLORHEXIDINE GLUCONATE 0.12 % MT SOLN
15.0000 mL | Freq: Once | OROMUCOSAL | Status: AC
  Administered 2023-11-03: 15 mL via OROMUCOSAL
  Filled 2023-11-02: qty 15

## 2023-11-02 MED ORDER — NITROGLYCERIN IN D5W 200-5 MCG/ML-% IV SOLN
2.0000 ug/min | INTRAVENOUS | Status: DC
Start: 2023-11-03 — End: 2023-11-04
  Filled 2023-11-02: qty 250

## 2023-11-02 MED ORDER — TRANEXAMIC ACID (OHS) BOLUS VIA INFUSION
15.0000 mg/kg | INTRAVENOUS | Status: AC
  Administered 2023-11-03: 1408.5 mg via INTRAVENOUS
  Filled 2023-11-02: qty 1409

## 2023-11-02 MED ORDER — LEVOFLOXACIN IN D5W 500 MG/100ML IV SOLN
500.0000 mg | INTRAVENOUS | Status: AC
Start: 2023-11-03 — End: 2023-11-04
  Administered 2023-11-03: 500 mg via INTRAVENOUS
  Filled 2023-11-02: qty 100

## 2023-11-02 MED ORDER — DEXMEDETOMIDINE HCL IN NACL 400 MCG/100ML IV SOLN
0.1000 ug/kg/h | INTRAVENOUS | Status: AC
  Administered 2023-11-03: .4 ug/kg/h via INTRAVENOUS
  Filled 2023-11-02: qty 100

## 2023-11-02 NOTE — Progress Notes (Addendum)
 Patient Name: Katelyn Cline Date of Encounter: 11/02/2023 [facility] Cardiologist: Lauro Portal, MD   Interval Summary  .    Doing well. No new complaints or acute events overnight Ready for her procedure tomorrow Reports she still feels some shortness of breath and overall pain -- unchanged from her baseline  Still on baseline oxygen  requirement, SpO2 100%   Vital Signs .    Vitals:   11/01/23 1637 11/01/23 2017 11/01/23 2316 11/02/23 0318  BP: (!) 107/49 (!) 141/58 129/61 (!) 129/45  Pulse: (!) 48 (!) 49 67 60  Resp: 18 15 13 16   Temp: 97.9 F (36.6 C) 98.1 F (36.7 C) 98.2 F (36.8 C) 98 F (36.7 C)  TempSrc: Oral Oral Oral Oral  SpO2: 98% 98% 97% 97%  Weight:      Height:       No intake or output data in the 24 hours ending 11/02/23 0835    10/25/2023   12:32 AM 10/20/2023   10:41 AM 10/01/2023    8:51 AM  Last 3 Weights  Weight (lbs) 207 lb 207 lb 6.4 oz 205 lb  Weight (kg) 93.895 kg 94.076 kg 92.987 kg      Telemetry/ECG    Sinus bradycardia, HR 50-60s, 40s when resting - Personally Reviewed  Physical Exam .   GEN: No acute distress, on baseline oxygen  requirement   Neck: No JVD Cardiac: bradycardic, 2/6 SEM at RUSB.  Respiratory: Clear to auscultation bilaterally. GI: Soft, nontender, non-distended  MS: No edema  Assessment & Plan .   Katelyn Cline is a 63 y.o. female with a history of multivessel CAD noted on cardiac catheterization on 10/01/2023, PAF on Eliquis , PAD, COPD, HTN, HLD, T2DM, and significant tobacco abuse. Patient underwent an outpatient cardiac catheterization earlier this month which showed severe multivessel disease. Patient was referred to CT surgery as an outpatient and was scheduled to see Dr. Deloise Ferries on 10/29/2023. However, she presented to the ED on 10/25/2023 with unstable angina. CABG has been expedited, now planned for 11/03/23.     Unstable angina Severe multivessel CAD, scheduled for CABG  11/03/2023 Hypertension Hyperlipidemia  Seen by CT surgery, plan is for CABG with AVR, LAA clipping, MAZE on 5/7 Continue ASA 81 mg daily Continue Imdur  120 mg daily Continue diltiazem  180 mg daily Continue SL nitroglycerin  PRN Continue Lipitor 80 mg daily Continue Zetia  10 mg daily  Holding Lisinopril  48 hours prior to CABG -- last dose 5/4 at 10:14 PM   Paroxysmal atrial fibrillation Currently in sinus rhythm with HR 60s Continue diltiazem  180 mg daily Home Eliquis  was held in setting of upcoming CABG  Started on IV heparin  when holding Eliquis  but then held in the setting of GI bleed   Moderate aortic stenosis  Patient planning to undergo SAVR at time of her CABG  Continue PO Lasix  40 mg daily    PAD Patient reports bilateral LE claudication ABI in 08/2023 were moderately reduced bilaterally (R = 0.53, L = 0.59) Arterial U/S showed bilateral SFA disease Continue ASA 81 mg daily Continue Lipitor 80 mg daily Plan for further management post-op   Per primary Tobacco abuse Diabetes Peripheral neuropathy Depression COPD Anemia with GI bleed Polycythemia vera     For questions or updates, please contact [facility] Please consult [email or web address] for contact info under   Signed, Jiles Mote, PA-C   I have personally seen and examined this patient. I agree with the assessment and plan as outlined  above.  She has severe Multi vessel disease. Awaiting CABG.  No chest pain today.  Tele with sinus My exam: NAD, Lungs clear Ext: no LE edema Plan: CABG tomorrow. Continue ASA, statin, Imdur  and Zetia .   Antoinette Batman, MD, [facility] 11/02/2023 12:39 PM

## 2023-11-02 NOTE — Progress Notes (Signed)
 Mobility Specialist Progress Note:   11/02/23 1114  Mobility  Activity Ambulated with assistance in hallway;Dangled on edge of bed;Ambulated independently to bathroom  Level of Assistance Standby assist, set-up cues, supervision of patient - no hands on  Assistive Device Front wheel walker  Distance Ambulated (ft) 260 ft  Activity Response Tolerated well  Mobility Referral Yes  Mobility visit 1 Mobility  Mobility Specialist Start Time (ACUTE ONLY) 1051  Mobility Specialist Stop Time (ACUTE ONLY) 1102  Mobility Specialist Time Calculation (min) (ACUTE ONLY) 11 min   Pt received dangling EOB, finishing breakfast. Agreeable to mobility session. Ambulated in hallway with RW and MinG for safety. Pt c/o of fatigue during session. Returned pt back to room, ambulated to bathroom w/o RW. Tolerated well, asx throughout. Pt returned to sitting EOB, left with all needs met.    Lakedra Washington Mobility Specialist Please contact via Special educational needs teacher or  Rehab office at [phone]

## 2023-11-02 NOTE — Anesthesia Preprocedure Evaluation (Signed)
 Anesthesia Evaluation  Patient identified by MRN, date of birth, ID band Patient awake    Reviewed: Allergy & Precautions, NPO status , Patient's Chart, lab work & pertinent test results  Airway Mallampati: III  TM Distance: >3 FB Neck ROM: Full    Dental  (+) Dental Advisory Given, Edentulous Upper, Edentulous Lower   Pulmonary sleep apnea , COPD (4L [place]),  COPD inhaler and oxygen  dependent, Current Smoker and Patient abstained from smoking.   Pulmonary exam normal breath sounds clear to auscultation       Cardiovascular hypertension, Pt. on medications + angina  + CAD and + Peripheral Vascular Disease  Normal cardiovascular exam+ Valvular Problems/Murmurs AS  Rhythm:Regular Rate:Normal  Echo 07/19/23: 1. Left ventricular ejection fraction, by estimation, is 55 to 60%. Left  ventricular ejection fraction by 3D volume is 58 %. The left ventricle has  normal function. The left ventricle has no regional wall motion  abnormalities. There is mild left  ventricular hypertrophy. Left ventricular diastolic parameters are  indeterminate.   2. Right ventricular systolic function is normal. The right ventricular  size is mildly enlarged. Tricuspid regurgitation signal is inadequate for  assessing PA pressure.   3. Left atrial size was moderately dilated.   4. The mitral valve is degenerative. Trivial mitral valve regurgitation.  Severe mitral annular calcification.   5. The aortic valve is calcified. There is moderate calcification of the  aortic valve. Aortic valve regurgitation is not visualized. Moderate  aortic valve stenosis. Mild AS by gradients (Vmax 2.6 m/s, MG ),  moderate by AVA (1.0cm^2) and DI (0.33).   Low SV index (24 cc/m^2), suspect paradoxical low flow low gradient  moderate AS   6. Aortic dilatation noted. There is mild dilatation of the ascending  aorta, measuring 38 mm.   7. The inferior vena cava is normal in  size with greater than 50%  respiratory variability, suggesting right atrial pressure of 3 mmHg.     Neuro/Psych  PSYCHIATRIC DISORDERS Anxiety Depression    Narcolepsy  Neuromuscular disease    GI/Hepatic Neg liver ROS,GERD  ,,  Endo/Other  diabetes, Type 2, Oral Hypoglycemic Agents    Renal/GU Renal InsufficiencyRenal disease     Musculoskeletal  (+) Arthritis ,  Fibromyalgia -  Abdominal   Peds  Hematology  (+) Blood dyscrasia (Polycythemia vera), anemia   Anesthesia Other Findings   Reproductive/Obstetrics                              Anesthesia Physical Anesthesia Plan  ASA: 4  Anesthesia Plan: General   Post-op Pain Management:    Induction: Intravenous  PONV Risk Score and Plan: 2 and Dexamethasone  and Ondansetron   Airway Management Planned: Oral ETT  Additional Equipment: Arterial line, CVP, PA Cath, TEE and Ultrasound Guidance Line Placement  Intra-op Plan:   Post-operative Plan: Post-operative intubation/ventilation  Informed Consent: I have reviewed the patients History and Physical, chart, labs and discussed the procedure including the risks, benefits and alternatives for the proposed anesthesia with the patient or authorized representative who has indicated his/her understanding and acceptance.     Dental advisory given  Plan Discussed with: CRNA  Anesthesia Plan Comments:          Anesthesia Quick Evaluation

## 2023-11-03 ENCOUNTER — Inpatient Hospital Stay (HOSPITAL_COMMUNITY)
Admission: RE | Admit: 2023-11-03 | Source: Home / Self Care | Admitting: Thoracic Surgery (Cardiothoracic Vascular Surgery)

## 2023-11-03 ENCOUNTER — Inpatient Hospital Stay (HOSPITAL_COMMUNITY)

## 2023-11-03 ENCOUNTER — Encounter (HOSPITAL_COMMUNITY): Payer: Self-pay | Admitting: Internal Medicine

## 2023-11-03 ENCOUNTER — Encounter (HOSPITAL_COMMUNITY)
Admission: EM | Disposition: A | Discharge status: Home Health | Payer: Self-pay | Source: Home / Self Care | Attending: Thoracic Surgery (Cardiothoracic Vascular Surgery)

## 2023-11-03 ENCOUNTER — Other Ambulatory Visit: Payer: Self-pay

## 2023-11-03 ENCOUNTER — Inpatient Hospital Stay (HOSPITAL_COMMUNITY): Payer: Self-pay | Admitting: Certified Registered Nurse Anesthetist

## 2023-11-03 ENCOUNTER — Inpatient Hospital Stay (HOSPITAL_COMMUNITY): Admitting: Certified Registered Nurse Anesthetist

## 2023-11-03 ENCOUNTER — Inpatient Hospital Stay (HOSPITAL_COMMUNITY)
Admission: EM | Disposition: A | Discharge status: Home Health | Payer: Self-pay | Source: Home / Self Care | Attending: Thoracic Surgery (Cardiothoracic Vascular Surgery)

## 2023-11-03 DIAGNOSIS — I480 Paroxysmal atrial fibrillation: Secondary | ICD-10-CM | POA: Diagnosis not present

## 2023-11-03 DIAGNOSIS — I350 Nonrheumatic aortic (valve) stenosis: Secondary | ICD-10-CM

## 2023-11-03 DIAGNOSIS — I9789 Other postprocedural complications and disorders of the circulatory system, not elsewhere classified: Secondary | ICD-10-CM | POA: Diagnosis not present

## 2023-11-03 DIAGNOSIS — J449 Chronic obstructive pulmonary disease, unspecified: Secondary | ICD-10-CM

## 2023-11-03 DIAGNOSIS — I2510 Atherosclerotic heart disease of native coronary artery without angina pectoris: Secondary | ICD-10-CM

## 2023-11-03 DIAGNOSIS — D45 Polycythemia vera: Secondary | ICD-10-CM

## 2023-11-03 DIAGNOSIS — I200 Unstable angina: Secondary | ICD-10-CM | POA: Diagnosis not present

## 2023-11-03 DIAGNOSIS — F17210 Nicotine dependence, cigarettes, uncomplicated: Secondary | ICD-10-CM | POA: Diagnosis not present

## 2023-11-03 DIAGNOSIS — I4891 Unspecified atrial fibrillation: Secondary | ICD-10-CM | POA: Diagnosis not present

## 2023-11-03 DIAGNOSIS — Z953 Presence of xenogenic heart valve: Secondary | ICD-10-CM

## 2023-11-03 DIAGNOSIS — Z951 Presence of aortocoronary bypass graft: Secondary | ICD-10-CM

## 2023-11-03 DIAGNOSIS — E119 Type 2 diabetes mellitus without complications: Secondary | ICD-10-CM | POA: Diagnosis not present

## 2023-11-03 DIAGNOSIS — Z720 Tobacco use: Secondary | ICD-10-CM

## 2023-11-03 LAB — POCT I-STAT 7, (LYTES, BLD GAS, ICA,H+H)
Acid-Base Excess: 1 mmol/L (ref 0.0–2.0)
Acid-Base Excess: 4 mmol/L — ABNORMAL HIGH (ref 0.0–2.0)
Acid-Base Excess: 5 mmol/L — ABNORMAL HIGH (ref 0.0–2.0)
Acid-Base Excess: 4 mmol/L — ABNORMAL HIGH (ref 0.0–2.0)
Acid-Base Excess: 2 mmol/L (ref 0.0–2.0)
Acid-Base Excess: 1 mmol/L (ref 0.0–2.0)
Acid-base deficit: 5 mmol/L — ABNORMAL HIGH (ref 0.0–2.0)
Acid-base deficit: 6 mmol/L — ABNORMAL HIGH (ref 0.0–2.0)
Acid-base deficit: 9 mmol/L — ABNORMAL HIGH (ref 0.0–2.0)
Bicarbonate: 29.1 mmol/L — ABNORMAL HIGH (ref 20.0–28.0)
Bicarbonate: 29.2 mmol/L — ABNORMAL HIGH (ref 20.0–28.0)
Bicarbonate: 29.2 mmol/L — ABNORMAL HIGH (ref 20.0–28.0)
Bicarbonate: 28.6 mmol/L — ABNORMAL HIGH (ref 20.0–28.0)
Bicarbonate: 28.2 mmol/L — ABNORMAL HIGH (ref 20.0–28.0)
Bicarbonate: 21.8 mmol/L (ref 20.0–28.0)
Bicarbonate: 20.5 mmol/L (ref 20.0–28.0)
Bicarbonate: 18.6 mmol/L — ABNORMAL LOW (ref 20.0–28.0)
Bicarbonate: 25 mmol/L (ref 20.0–28.0)
Calcium, Ion: 1.29 mmol/L (ref 1.15–1.40)
Calcium, Ion: 1.1 mmol/L — ABNORMAL LOW (ref 1.15–1.40)
Calcium, Ion: 1.13 mmol/L — ABNORMAL LOW (ref 1.15–1.40)
Calcium, Ion: 1.15 mmol/L (ref 1.15–1.40)
Calcium, Ion: 1.38 mmol/L (ref 1.15–1.40)
Calcium, Ion: 1.32 mmol/L (ref 1.15–1.40)
Calcium, Ion: 1.41 mmol/L — ABNORMAL HIGH (ref 1.15–1.40)
Calcium, Ion: 1.28 mmol/L (ref 1.15–1.40)
Calcium, Ion: 1.26 mmol/L (ref 1.15–1.40)
HCT: 31 % — ABNORMAL LOW (ref 36.0–46.0)
HCT: 22 % — ABNORMAL LOW (ref 36.0–46.0)
HCT: 22 % — ABNORMAL LOW (ref 36.0–46.0)
HCT: 22 % — ABNORMAL LOW (ref 36.0–46.0)
HCT: 26 % — ABNORMAL LOW (ref 36.0–46.0)
HCT: 21 % — ABNORMAL LOW (ref 36.0–46.0)
HCT: 21 % — ABNORMAL LOW (ref 36.0–46.0)
HCT: 22 % — ABNORMAL LOW (ref 36.0–46.0)
HCT: 28 % — ABNORMAL LOW (ref 36.0–46.0)
Hemoglobin: 10.5 g/dL — ABNORMAL LOW (ref 12.0–15.0)
Hemoglobin: 7.5 g/dL — ABNORMAL LOW (ref 12.0–15.0)
Hemoglobin: 7.5 g/dL — ABNORMAL LOW (ref 12.0–15.0)
Hemoglobin: 7.5 g/dL — ABNORMAL LOW (ref 12.0–15.0)
Hemoglobin: 8.8 g/dL — ABNORMAL LOW (ref 12.0–15.0)
Hemoglobin: 7.1 g/dL — ABNORMAL LOW (ref 12.0–15.0)
Hemoglobin: 7.1 g/dL — ABNORMAL LOW (ref 12.0–15.0)
Hemoglobin: 7.5 g/dL — ABNORMAL LOW (ref 12.0–15.0)
Hemoglobin: 9.5 g/dL — ABNORMAL LOW (ref 12.0–15.0)
O2 Saturation: 100 %
O2 Saturation: 100 %
O2 Saturation: 100 %
O2 Saturation: 100 %
O2 Saturation: 99 %
O2 Saturation: 93 %
O2 Saturation: 96 %
O2 Saturation: 100 %
O2 Saturation: 99 %
Patient temperature: 36.3
Patient temperature: 36.2
Patient temperature: 36.5
Patient temperature: 37.7
Potassium: 4.1 mmol/L (ref 3.5–5.1)
Potassium: 4.3 mmol/L (ref 3.5–5.1)
Potassium: 4.2 mmol/L (ref 3.5–5.1)
Potassium: 4.3 mmol/L (ref 3.5–5.1)
Potassium: 4 mmol/L (ref 3.5–5.1)
Potassium: 5.1 mmol/L (ref 3.5–5.1)
Potassium: 6 mmol/L — ABNORMAL HIGH (ref 3.5–5.1)
Potassium: 5.9 mmol/L — ABNORMAL HIGH (ref 3.5–5.1)
Potassium: 3.9 mmol/L (ref 3.5–5.1)
Sodium: 141 mmol/L (ref 135–145)
Sodium: 140 mmol/L (ref 135–145)
Sodium: 140 mmol/L (ref 135–145)
Sodium: 140 mmol/L (ref 135–145)
Sodium: 141 mmol/L (ref 135–145)
Sodium: 139 mmol/L (ref 135–145)
Sodium: 139 mmol/L (ref 135–145)
Sodium: 142 mmol/L (ref 135–145)
Sodium: 140 mmol/L (ref 135–145)
TCO2: 31 mmol/L (ref 22–32)
TCO2: 31 mmol/L (ref 22–32)
TCO2: 30 mmol/L (ref 22–32)
TCO2: 30 mmol/L (ref 22–32)
TCO2: 30 mmol/L (ref 22–32)
TCO2: 23 mmol/L (ref 22–32)
TCO2: 22 mmol/L (ref 22–32)
TCO2: 20 mmol/L — ABNORMAL LOW (ref 22–32)
TCO2: 26 mmol/L (ref 22–32)
pCO2 arterial: 62.3 mmHg — ABNORMAL HIGH (ref 32–48)
pCO2 arterial: 48.3 mmHg — ABNORMAL HIGH (ref 32–48)
pCO2 arterial: 38.7 mmHg (ref 32–48)
pCO2 arterial: 40.1 mmHg (ref 32–48)
pCO2 arterial: 54.4 mmHg — ABNORMAL HIGH (ref 32–48)
pCO2 arterial: 50.9 mmHg — ABNORMAL HIGH (ref 32–48)
pCO2 arterial: 44.4 mmHg (ref 32–48)
pCO2 arterial: 45.2 mmHg (ref 32–48)
pCO2 arterial: 36.8 mmHg (ref 32–48)
pH, Arterial: 7.278 — ABNORMAL LOW (ref 7.35–7.45)
pH, Arterial: 7.39 (ref 7.35–7.45)
pH, Arterial: 7.486 — ABNORMAL HIGH (ref 7.35–7.45)
pH, Arterial: 7.462 — ABNORMAL HIGH (ref 7.35–7.45)
pH, Arterial: 7.323 — ABNORMAL LOW (ref 7.35–7.45)
pH, Arterial: 7.235 — ABNORMAL LOW (ref 7.35–7.45)
pH, Arterial: 7.268 — ABNORMAL LOW (ref 7.35–7.45)
pH, Arterial: 7.22 — ABNORMAL LOW (ref 7.35–7.45)
pH, Arterial: 7.443 (ref 7.35–7.45)
pO2, Arterial: 391 mmHg — ABNORMAL HIGH (ref 83–108)
pO2, Arterial: 345 mmHg — ABNORMAL HIGH (ref 83–108)
pO2, Arterial: 382 mmHg — ABNORMAL HIGH (ref 83–108)
pO2, Arterial: 354 mmHg — ABNORMAL HIGH (ref 83–108)
pO2, Arterial: 182 mmHg — ABNORMAL HIGH (ref 83–108)
pO2, Arterial: 76 mmHg — ABNORMAL LOW (ref 83–108)
pO2, Arterial: 89 mmHg (ref 83–108)
pO2, Arterial: 207 mmHg — ABNORMAL HIGH (ref 83–108)
pO2, Arterial: 127 mmHg — ABNORMAL HIGH (ref 83–108)

## 2023-11-03 LAB — POCT I-STAT, CHEM 8
BUN: 26 mg/dL — ABNORMAL HIGH (ref 8–23)
BUN: 25 mg/dL — ABNORMAL HIGH (ref 8–23)
BUN: 25 mg/dL — ABNORMAL HIGH (ref 8–23)
BUN: 25 mg/dL — ABNORMAL HIGH (ref 8–23)
BUN: 25 mg/dL — ABNORMAL HIGH (ref 8–23)
BUN: 29 mg/dL — ABNORMAL HIGH (ref 8–23)
Calcium, Ion: 1.32 mmol/L (ref 1.15–1.40)
Calcium, Ion: 1.27 mmol/L (ref 1.15–1.40)
Calcium, Ion: 1.13 mmol/L — ABNORMAL LOW (ref 1.15–1.40)
Calcium, Ion: 1.16 mmol/L (ref 1.15–1.40)
Calcium, Ion: 1.16 mmol/L (ref 1.15–1.40)
Calcium, Ion: 1.38 mmol/L (ref 1.15–1.40)
Chloride: 101 mmol/L (ref 98–111)
Chloride: 102 mmol/L (ref 98–111)
Chloride: 103 mmol/L (ref 98–111)
Chloride: 103 mmol/L (ref 98–111)
Chloride: 104 mmol/L (ref 98–111)
Chloride: 104 mmol/L (ref 98–111)
Creatinine, Ser: 0.7 mg/dL (ref 0.44–1.00)
Creatinine, Ser: 0.7 mg/dL (ref 0.44–1.00)
Creatinine, Ser: 0.6 mg/dL (ref 0.44–1.00)
Creatinine, Ser: 0.6 mg/dL (ref 0.44–1.00)
Creatinine, Ser: 0.6 mg/dL (ref 0.44–1.00)
Creatinine, Ser: 0.7 mg/dL (ref 0.44–1.00)
Glucose, Bld: 122 mg/dL — ABNORMAL HIGH (ref 70–99)
Glucose, Bld: 110 mg/dL — ABNORMAL HIGH (ref 70–99)
Glucose, Bld: 105 mg/dL — ABNORMAL HIGH (ref 70–99)
Glucose, Bld: 129 mg/dL — ABNORMAL HIGH (ref 70–99)
Glucose, Bld: 123 mg/dL — ABNORMAL HIGH (ref 70–99)
Glucose, Bld: 140 mg/dL — ABNORMAL HIGH (ref 70–99)
HCT: 29 % — ABNORMAL LOW (ref 36.0–46.0)
HCT: 27 % — ABNORMAL LOW (ref 36.0–46.0)
HCT: 22 % — ABNORMAL LOW (ref 36.0–46.0)
HCT: 24 % — ABNORMAL LOW (ref 36.0–46.0)
HCT: 24 % — ABNORMAL LOW (ref 36.0–46.0)
HCT: 25 % — ABNORMAL LOW (ref 36.0–46.0)
Hemoglobin: 9.9 g/dL — ABNORMAL LOW (ref 12.0–15.0)
Hemoglobin: 9.2 g/dL — ABNORMAL LOW (ref 12.0–15.0)
Hemoglobin: 7.5 g/dL — ABNORMAL LOW (ref 12.0–15.0)
Hemoglobin: 8.2 g/dL — ABNORMAL LOW (ref 12.0–15.0)
Hemoglobin: 8.2 g/dL — ABNORMAL LOW (ref 12.0–15.0)
Hemoglobin: 8.5 g/dL — ABNORMAL LOW (ref 12.0–15.0)
Potassium: 4.1 mmol/L (ref 3.5–5.1)
Potassium: 4.2 mmol/L (ref 3.5–5.1)
Potassium: 4.5 mmol/L (ref 3.5–5.1)
Potassium: 4 mmol/L (ref 3.5–5.1)
Potassium: 4 mmol/L (ref 3.5–5.1)
Potassium: 4.4 mmol/L (ref 3.5–5.1)
Sodium: 141 mmol/L (ref 135–145)
Sodium: 139 mmol/L (ref 135–145)
Sodium: 139 mmol/L (ref 135–145)
Sodium: 140 mmol/L (ref 135–145)
Sodium: 139 mmol/L (ref 135–145)
Sodium: 141 mmol/L (ref 135–145)
TCO2: 32 mmol/L (ref 22–32)
TCO2: 27 mmol/L (ref 22–32)
TCO2: 32 mmol/L (ref 22–32)
TCO2: 31 mmol/L (ref 22–32)
TCO2: 30 mmol/L (ref 22–32)
TCO2: 30 mmol/L (ref 22–32)

## 2023-11-03 LAB — BASIC METABOLIC PANEL WITH GFR
Anion gap: 7 (ref 5–15)
Anion gap: 15 (ref 5–15)
BUN: 27 mg/dL — ABNORMAL HIGH (ref 8–23)
BUN: 24 mg/dL — ABNORMAL HIGH (ref 8–23)
CO2: 32 mmol/L (ref 22–32)
CO2: 23 mmol/L (ref 22–32)
Calcium: 9.5 mg/dL (ref 8.9–10.3)
Calcium: 9.5 mg/dL (ref 8.9–10.3)
Chloride: 102 mmol/L (ref 98–111)
Chloride: 104 mmol/L (ref 98–111)
Creatinine, Ser: 0.76 mg/dL (ref 0.44–1.00)
Creatinine, Ser: 0.85 mg/dL (ref 0.44–1.00)
GFR, Estimated: >60 mL/min (ref >60)
GFR, Estimated: >60 mL/min (ref >60)
Glucose, Bld: 135 mg/dL — ABNORMAL HIGH (ref 70–99)
Glucose, Bld: 191 mg/dL — ABNORMAL HIGH (ref 70–99)
Potassium: 4.3 mmol/L (ref 3.5–5.1)
Potassium: 3.9 mmol/L (ref 3.5–5.1)
Sodium: 141 mmol/L (ref 135–145)
Sodium: 142 mmol/L (ref 135–145)

## 2023-11-03 LAB — POCT I-STAT EG7
Acid-Base Excess: 18 mmol/L — ABNORMAL HIGH (ref 0.0–2.0)
Bicarbonate: 43.5 mmol/L — ABNORMAL HIGH (ref 20.0–28.0)
Calcium, Ion: 1.11 mmol/L — ABNORMAL LOW (ref 1.15–1.40)
HCT: 23 % — ABNORMAL LOW (ref 36.0–46.0)
Hemoglobin: 7.8 g/dL — ABNORMAL LOW (ref 12.0–15.0)
O2 Saturation: 62 %
Potassium: 3.5 mmol/L (ref 3.5–5.1)
Sodium: 153 mmol/L — ABNORMAL HIGH (ref 135–145)
TCO2: 45 mmol/L — ABNORMAL HIGH (ref 22–32)
pCO2, Ven: 64.7 mmHg — ABNORMAL HIGH (ref 44–60)
pH, Ven: 7.436 — ABNORMAL HIGH (ref 7.25–7.43)
pO2, Ven: 33 mmHg (ref 32–45)

## 2023-11-03 LAB — CBC
HCT: 33.9 % — ABNORMAL LOW (ref 36.0–46.0)
HCT: 22.9 % — ABNORMAL LOW (ref 36.0–46.0)
HCT: 30.9 % — ABNORMAL LOW (ref 36.0–46.0)
Hemoglobin: 9.5 g/dL — ABNORMAL LOW (ref 12.0–15.0)
Hemoglobin: 6.3 g/dL — CL (ref 12.0–15.0)
Hemoglobin: 10.4 g/dL — ABNORMAL LOW (ref 12.0–15.0)
MCH: 26.5 pg (ref 26.0–34.0)
MCH: 27.4 pg (ref 26.0–34.0)
MCH: 29.9 pg (ref 26.0–34.0)
MCHC: 28 g/dL — ABNORMAL LOW (ref 30.0–36.0)
MCHC: 27.5 g/dL — ABNORMAL LOW (ref 30.0–36.0)
MCHC: 33.7 g/dL (ref 30.0–36.0)
MCV: 94.4 fL (ref 80.0–100.0)
MCV: 99.6 fL (ref 80.0–100.0)
MCV: 88.8 fL (ref 80.0–100.0)
Platelets: 463 10*3/uL — ABNORMAL HIGH (ref 150–400)
Platelets: 418 10*3/uL — ABNORMAL HIGH (ref 150–400)
Platelets: 190 10*3/uL (ref 150–400)
RBC: 3.59 MIL/uL — ABNORMAL LOW (ref 3.87–5.11)
RBC: 2.3 MIL/uL — ABNORMAL LOW (ref 3.87–5.11)
RBC: 3.48 MIL/uL — ABNORMAL LOW (ref 3.87–5.11)
RDW: 22.8 % — ABNORMAL HIGH (ref 11.5–15.5)
RDW: 22.6 % — ABNORMAL HIGH (ref 11.5–15.5)
RDW: 17.2 % — ABNORMAL HIGH (ref 11.5–15.5)
WBC: 13.5 10*3/uL — ABNORMAL HIGH (ref 4.0–10.5)
WBC: 36.3 10*3/uL — ABNORMAL HIGH (ref 4.0–10.5)
WBC: 18.9 10*3/uL — ABNORMAL HIGH (ref 4.0–10.5)
nRBC: 0.2 % (ref 0.0–0.2)
nRBC: 1.1 % — ABNORMAL HIGH (ref 0.0–0.2)
nRBC: 2.3 % — ABNORMAL HIGH (ref 0.0–0.2)

## 2023-11-03 LAB — HEMOGLOBIN AND HEMATOCRIT, BLOOD
HCT: 23.1 % — ABNORMAL LOW (ref 36.0–46.0)
Hemoglobin: 6.7 g/dL — CL (ref 12.0–15.0)

## 2023-11-03 LAB — PROTIME-INR
INR: 1.5 — ABNORMAL HIGH (ref 0.8–1.2)
INR: 2.1 — ABNORMAL HIGH (ref 0.8–1.2)
Prothrombin Time: 18.4 s — ABNORMAL HIGH (ref 11.4–15.2)
Prothrombin Time: 23.6 s — ABNORMAL HIGH (ref 11.4–15.2)

## 2023-11-03 LAB — ECHO INTRAOPERATIVE TEE
AV Mean grad: 21 mmHg
Height: 68.5 in
Height: 68.5 in
Weight: 3536 [oz_av]
Weight: 3536 [oz_av]

## 2023-11-03 LAB — GLOBAL TEG PANEL
CFF Max Amplitude: 27.9 mm (ref 15–32)
CK with Heparinase (R): 4.5 min (ref 4.3–8.3)
Citrated Functional Fibrinogen: 509.1 mg/dL (ref 278–581)
Citrated Kaolin (K): 0.7 min — ABNORMAL LOW (ref 0.8–2.1)
Citrated Kaolin (MA): 67.4 mm (ref 52–69)
Citrated Kaolin (R): 4.7 min (ref 4.6–9.1)
Citrated Kaolin Angle: 80.1 deg — ABNORMAL HIGH (ref 63–78)
Citrated Rapid TEG (MA): 66.7 mm (ref 52–70)

## 2023-11-03 LAB — APTT
aPTT: 35 s (ref 24–36)
aPTT: 43 s — ABNORMAL HIGH (ref 24–36)

## 2023-11-03 LAB — BLOOD GAS, ARTERIAL
Acid-Base Excess: 8.1 mmol/L — ABNORMAL HIGH (ref 0.0–2.0)
Bicarbonate: 35.1 mmol/L — ABNORMAL HIGH (ref 20.0–28.0)
O2 Saturation: 95.1 %
Patient temperature: 36.8
pCO2 arterial: 57 mmHg — ABNORMAL HIGH (ref 32–48)
pH, Arterial: 7.39 (ref 7.35–7.45)
pO2, Arterial: 82 mmHg — ABNORMAL LOW (ref 83–108)

## 2023-11-03 LAB — COOXEMETRY PANEL
Carboxyhemoglobin: 1.4 % (ref 0.5–1.5)
Methemoglobin: 1.5 % (ref 0.0–1.5)
O2 Saturation: 43.7 %
Total hemoglobin: <6.9 g/dL — CL (ref 12.0–16.0)

## 2023-11-03 LAB — PREPARE RBC (CROSSMATCH)

## 2023-11-03 LAB — GLUCOSE, CAPILLARY
Glucose-Capillary: 146 mg/dL — ABNORMAL HIGH (ref 70–99)
Glucose-Capillary: 217 mg/dL — ABNORMAL HIGH (ref 70–99)
Glucose-Capillary: 234 mg/dL — ABNORMAL HIGH (ref 70–99)
Glucose-Capillary: 130 mg/dL — ABNORMAL HIGH (ref 70–99)
Glucose-Capillary: 90 mg/dL (ref 70–99)
Glucose-Capillary: 196 mg/dL — ABNORMAL HIGH (ref 70–99)

## 2023-11-03 LAB — PLATELET COUNT: Platelets: 348 10*3/uL (ref 150–400)

## 2023-11-03 LAB — FIBRINOGEN
Fibrinogen: 298 mg/dL (ref 210–475)
Fibrinogen: 222 mg/dL (ref 210–475)

## 2023-11-03 LAB — MAGNESIUM: Magnesium: 2.4 mg/dL (ref 1.7–2.4)

## 2023-11-03 SURGERY — CORONARY ARTERY BYPASS GRAFTING (CABG)
Anesthesia: General | Site: Chest

## 2023-11-03 SURGERY — EXPLORATION POST OPERATIVE OPEN HEART
Anesthesia: General | Site: Chest

## 2023-11-03 MED ORDER — ASPIRIN 81 MG PO CHEW
324.0000 mg | CHEWABLE_TABLET | Freq: Once | ORAL | Status: DC
  Filled 2023-11-03: qty 4

## 2023-11-03 MED ORDER — SODIUM CHLORIDE 0.9% FLUSH
3.0000 mL | Freq: Two times a day (BID) | INTRAVENOUS | Status: DC
  Administered 2023-11-04 – 2023-11-07 (×8): 3 mL via INTRAVENOUS

## 2023-11-03 MED ORDER — CALCIUM CHLORIDE 10 % IV SOLN
INTRAVENOUS | Status: AC
  Filled 2023-11-03: qty 10

## 2023-11-03 MED ORDER — SODIUM CHLORIDE 0.9% FLUSH
3.0000 mL | Freq: Two times a day (BID) | INTRAVENOUS | Status: DC
  Administered 2023-11-04 – 2023-11-05 (×4): 3 mL via INTRAVENOUS
  Administered 2023-11-06 (×2): 10 mL via INTRAVENOUS
  Administered 2023-11-07: 5 mL via INTRAVENOUS
  Administered 2023-11-07: 3 mL via INTRAVENOUS

## 2023-11-03 MED ORDER — SODIUM CHLORIDE 0.45 % IV SOLN: INTRAVENOUS | Status: AC

## 2023-11-03 MED ORDER — LEVOFLOXACIN IN D5W 750 MG/150ML IV SOLN
750.0000 mg | INTRAVENOUS | Status: AC
  Administered 2023-11-03: 750 mg via INTRAVENOUS
  Filled 2023-11-03: qty 150

## 2023-11-03 MED ORDER — FENTANYL CITRATE (PF) 250 MCG/5ML IJ SOLN
INTRAMUSCULAR | Status: AC
  Filled 2023-11-03: qty 5

## 2023-11-03 MED ORDER — PANTOPRAZOLE SODIUM 40 MG PO TBEC: 40.0000 mg | DELAYED_RELEASE_TABLET | Freq: Every day | ORAL | Status: DC

## 2023-11-03 MED ORDER — ALBUMIN HUMAN 5 % IV SOLN: INTRAVENOUS | Status: DC | PRN

## 2023-11-03 MED ORDER — PANTOPRAZOLE SODIUM 40 MG IV SOLR
40.0000 mg | Freq: Two times a day (BID) | INTRAVENOUS | Status: DC
  Administered 2023-11-03 – 2023-11-15 (×24): 40 mg via INTRAVENOUS
  Filled 2023-11-03 (×24): qty 10

## 2023-11-03 MED ORDER — NOREPINEPHRINE 16 MG/250ML-% IV SOLN
0.0000 ug/min | INTRAVENOUS | Status: DC
  Administered 2023-11-03 (×2): 34 ug/min via INTRAVENOUS
  Filled 2023-11-03: qty 250

## 2023-11-03 MED ORDER — ROCURONIUM BROMIDE 10 MG/ML (PF) SYRINGE
PREFILLED_SYRINGE | INTRAVENOUS | Status: AC
  Filled 2023-11-03: qty 10

## 2023-11-03 MED ORDER — ACETAMINOPHEN 160 MG/5ML PO SOLN
650.0000 mg | Freq: Once | ORAL | Status: DC
  Filled 2023-11-03: qty 20.3

## 2023-11-03 MED ORDER — SODIUM BICARBONATE 8.4 % IV SOLN
INTRAVENOUS | Status: AC
  Administered 2023-11-03: 100 meq via INTRAVENOUS
  Filled 2023-11-03: qty 50

## 2023-11-03 MED ORDER — SODIUM CHLORIDE (PF) 0.9 % IJ SOLN: OROMUCOSAL | Status: DC | PRN

## 2023-11-03 MED ORDER — SODIUM CHLORIDE 0.9 % IV SOLN: INTRAVENOUS | Status: DC | PRN

## 2023-11-03 MED ORDER — TRAMADOL HCL 50 MG PO TABS
50.0000 mg | ORAL_TABLET | ORAL | Status: DC | PRN
  Administered 2023-11-11: 100 mg via ORAL
  Administered 2023-11-11: 50 mg via ORAL
  Administered 2023-11-12 – 2023-11-15 (×9): 100 mg via ORAL
  Administered 2023-11-15: 50 mg via ORAL
  Administered 2023-11-15: 100 mg via ORAL
  Administered 2023-11-15: 50 mg via ORAL
  Administered 2023-11-16: 100 mg via ORAL
  Administered 2023-11-16 (×2): 50 mg via ORAL
  Administered 2023-11-16 – 2023-11-17 (×2): 100 mg via ORAL
  Administered 2023-11-17: 50 mg via ORAL
  Administered 2023-11-17 – 2023-11-22 (×8): 100 mg via ORAL
  Administered 2023-11-23 (×2): 50 mg via ORAL
  Administered 2023-11-23: 100 mg via ORAL
  Administered 2023-11-24: 50 mg via ORAL
  Administered 2023-11-24: 100 mg via ORAL
  Filled 2023-11-03: qty 1
  Filled 2023-11-03: qty 2
  Filled 2023-11-03: qty 1
  Filled 2023-11-03 (×3): qty 2
  Filled 2023-11-03: qty 1
  Filled 2023-11-03 (×9): qty 2
  Filled 2023-11-03: qty 1
  Filled 2023-11-03 (×16): qty 2

## 2023-11-03 MED ORDER — EPINEPHRINE HCL 5 MG/250ML IV SOLN IN NS
0.5000 ug/min | INTRAVENOUS | Status: DC
  Administered 2023-11-03: 3 ug/min via INTRAVENOUS
  Filled 2023-11-03: qty 250

## 2023-11-03 MED ORDER — LACTATED RINGERS IV SOLN: INTRAVENOUS | Status: DC

## 2023-11-03 MED ORDER — ONDANSETRON HCL 4 MG/2ML IJ SOLN
4.0000 mg | Freq: Four times a day (QID) | INTRAMUSCULAR | Status: DC | PRN
  Administered 2023-11-14: 4 mg via INTRAVENOUS
  Filled 2023-11-03: qty 2

## 2023-11-03 MED ORDER — 0.9 % SODIUM CHLORIDE (POUR BTL) OPTIME
TOPICAL | Status: DC | PRN
  Administered 2023-11-03: 5000 mL

## 2023-11-03 MED ORDER — LACTATED RINGERS IV SOLN: INTRAVENOUS | Status: DC | PRN

## 2023-11-03 MED ORDER — PROPOFOL 1000 MG/100ML IV EMUL
5.0000 ug/kg/min | INTRAVENOUS | Status: DC
  Administered 2023-11-03: 50 ug/kg/min via INTRAVENOUS
  Filled 2023-11-03: qty 100

## 2023-11-03 MED ORDER — METOPROLOL TARTRATE 25 MG/10 ML ORAL SUSPENSION: 12.5000 mg | Freq: Two times a day (BID) | ORAL | Status: DC

## 2023-11-03 MED ORDER — SODIUM CHLORIDE 0.9% FLUSH
3.0000 mL | Freq: Two times a day (BID) | INTRAVENOUS | Status: DC
Start: 2023-11-03 — End: 2023-11-08
  Administered 2023-11-04 – 2023-11-05 (×4): 3 mL via INTRAVENOUS
  Administered 2023-11-06: 10 mL via INTRAVENOUS
  Administered 2023-11-07: 5 mL via INTRAVENOUS
  Administered 2023-11-07: 3 mL via INTRAVENOUS

## 2023-11-03 MED ORDER — ARFORMOTEROL TARTRATE 15 MCG/2ML IN NEBU
15.0000 ug | INHALATION_SOLUTION | Freq: Two times a day (BID) | RESPIRATORY_TRACT | Status: DC
  Administered 2023-11-04 – 2023-11-24 (×40): 15 ug via RESPIRATORY_TRACT
  Filled 2023-11-03 (×41): qty 2

## 2023-11-03 MED ORDER — EPHEDRINE SULFATE (PRESSORS) 50 MG/ML IJ SOLN
INTRAMUSCULAR | Status: DC | PRN
  Administered 2023-11-03 (×2): 5 mg via INTRAVENOUS
  Administered 2023-11-03: 10 mg via INTRAVENOUS

## 2023-11-03 MED ORDER — PROPOFOL 10 MG/ML IV BOLUS
INTRAVENOUS | Status: AC
  Filled 2023-11-03: qty 20

## 2023-11-03 MED ORDER — 0.9 % SODIUM CHLORIDE (POUR BTL) OPTIME
TOPICAL | Status: DC | PRN
  Administered 2023-11-03: 5000 mL
  Administered 2023-11-03: 1000 mL

## 2023-11-03 MED ORDER — PROPOFOL 1000 MG/100ML IV EMUL
INTRAVENOUS | Status: AC
  Administered 2023-11-04: 30 ug/kg/min via INTRAVENOUS
  Filled 2023-11-03: qty 100

## 2023-11-03 MED ORDER — INSULIN REGULAR(HUMAN) IN NACL 100-0.9 UT/100ML-% IV SOLN
INTRAVENOUS | Status: DC
  Administered 2023-11-03: 2.4 [IU]/h via INTRAVENOUS
  Administered 2023-11-03: 8 [IU]/h via INTRAVENOUS
  Administered 2023-11-04: 6 [IU]/h via INTRAVENOUS
  Filled 2023-11-03 (×2): qty 100

## 2023-11-03 MED ORDER — SODIUM CHLORIDE 0.9% FLUSH: 3.0000 mL | INTRAVENOUS | Status: DC | PRN

## 2023-11-03 MED ORDER — HEPARIN SODIUM (PORCINE) 1000 UNIT/ML IJ SOLN
INTRAMUSCULAR | Status: DC | PRN
  Administered 2023-11-03: 40000 [IU] via INTRAVENOUS

## 2023-11-03 MED ORDER — ALBUMIN HUMAN 5 % IV SOLN
250.0000 mL | INTRAVENOUS | Status: DC | PRN
  Administered 2023-11-03 – 2023-11-04 (×4): 12.5 g via INTRAVENOUS
  Filled 2023-11-03: qty 250

## 2023-11-03 MED ORDER — CHLORHEXIDINE GLUCONATE 0.12 % MT SOLN
15.0000 mL | Freq: Once | OROMUCOSAL | Status: AC
  Administered 2023-11-03: 15 mL via OROMUCOSAL

## 2023-11-03 MED ORDER — OXYCODONE HCL 5 MG PO TABS
5.0000 mg | ORAL_TABLET | ORAL | Status: DC | PRN
  Administered 2023-11-07: 10 mg via ORAL
  Administered 2023-11-07: 5 mg via ORAL
  Administered 2023-11-08 – 2023-11-10 (×4): 10 mg via ORAL
  Filled 2023-11-03 (×4): qty 2
  Filled 2023-11-03: qty 1
  Filled 2023-11-03: qty 2

## 2023-11-03 MED ORDER — LACTATED RINGERS IV SOLN: INTRAVENOUS | Status: AC

## 2023-11-03 MED ORDER — VISTASEAL 10 ML SINGLE DOSE KIT
10.0000 mL | PACK | CUTANEOUS | Status: DC
  Filled 2023-11-03: qty 10

## 2023-11-03 MED ORDER — SODIUM BICARBONATE 8.4 % IV SOLN
INTRAVENOUS | Status: AC
  Administered 2023-11-03: 100 meq via INTRAVENOUS
  Filled 2023-11-03: qty 100

## 2023-11-03 MED ORDER — BISACODYL 10 MG RE SUPP
10.0000 mg | Freq: Every day | RECTAL | Status: DC
  Filled 2023-11-03: qty 1

## 2023-11-03 MED ORDER — IPRATROPIUM-ALBUTEROL 0.5-2.5 (3) MG/3ML IN SOLN
3.0000 mL | Freq: Once | RESPIRATORY_TRACT | Status: AC
  Administered 2023-11-03: 3 mL via RESPIRATORY_TRACT
  Filled 2023-11-03: qty 3

## 2023-11-03 MED ORDER — CALCIUM GLUCONATE-NACL 2-0.675 GM/100ML-% IV SOLN
2.0000 g | Freq: Once | INTRAVENOUS | Status: DC
  Filled 2023-11-03: qty 100

## 2023-11-03 MED ORDER — SODIUM CHLORIDE 0.9% IV SOLUTION: Freq: Once | INTRAVENOUS | Status: AC

## 2023-11-03 MED ORDER — METOPROLOL TARTRATE 5 MG/5ML IV SOLN
2.5000 mg | INTRAVENOUS | Status: DC | PRN
  Administered 2023-11-07 (×2): 5 mg via INTRAVENOUS
  Filled 2023-11-03 (×3): qty 5

## 2023-11-03 MED ORDER — CALCIUM CHLORIDE 10 % IV SOLN
1.0000 g | Freq: Once | INTRAVENOUS | Status: AC
  Administered 2023-11-03: 1 g via INTRAVENOUS

## 2023-11-03 MED ORDER — METOCLOPRAMIDE HCL 5 MG/ML IJ SOLN
10.0000 mg | Freq: Four times a day (QID) | INTRAMUSCULAR | Status: AC
  Administered 2023-11-03 – 2023-11-05 (×5): 10 mg via INTRAVENOUS
  Filled 2023-11-03 (×5): qty 2

## 2023-11-03 MED ORDER — PHENYLEPHRINE HCL (PRESSORS) 10 MG/ML IV SOLN
INTRAVENOUS | Status: DC | PRN
  Administered 2023-11-03: 80 ug via INTRAVENOUS
  Administered 2023-11-03: 160 ug via INTRAVENOUS

## 2023-11-03 MED ORDER — SODIUM BICARBONATE 8.4 % IV SOLN
100.0000 meq | Freq: Once | INTRAVENOUS | Status: AC
  Administered 2023-11-03: 100 meq via INTRAVENOUS

## 2023-11-03 MED ORDER — CHLORHEXIDINE GLUCONATE CLOTH 2 % EX PADS
6.0000 | MEDICATED_PAD | Freq: Every day | CUTANEOUS | Status: DC
  Administered 2023-11-04 – 2023-11-08 (×5): 6 via TOPICAL

## 2023-11-03 MED ORDER — EPINEPHRINE HCL 5 MG/250ML IV SOLN IN NS
INTRAVENOUS | Status: DC | PRN
  Administered 2023-11-03: 3 ug/min via INTRAVENOUS

## 2023-11-03 MED ORDER — MIDAZOLAM HCL 2 MG/2ML IJ SOLN
INTRAMUSCULAR | Status: AC
  Filled 2023-11-03: qty 2

## 2023-11-03 MED ORDER — ACETAMINOPHEN 160 MG/5ML PO SOLN
1000.0000 mg | Freq: Four times a day (QID) | ORAL | Status: AC
  Administered 2023-11-03 – 2023-11-07 (×13): 1000 mg
  Filled 2023-11-03 (×12): qty 40.6

## 2023-11-03 MED ORDER — SODIUM CHLORIDE 0.9% FLUSH
3.0000 mL | Freq: Two times a day (BID) | INTRAVENOUS | Status: DC
Start: 2023-11-03 — End: 2023-11-08
  Administered 2023-11-04 – 2023-11-05 (×3): 3 mL via INTRAVENOUS
  Administered 2023-11-06 – 2023-11-07 (×2): 5 mL via INTRAVENOUS
  Administered 2023-11-07: 3 mL via INTRAVENOUS

## 2023-11-03 MED ORDER — PROPOFOL 1000 MG/100ML IV EMUL
INTRAVENOUS | Status: AC
  Filled 2023-11-03: qty 100

## 2023-11-03 MED ORDER — PHENYLEPHRINE 80 MCG/ML (10ML) SYRINGE FOR IV PUSH (FOR BLOOD PRESSURE SUPPORT)
PREFILLED_SYRINGE | INTRAVENOUS | Status: AC
  Filled 2023-11-03: qty 10

## 2023-11-03 MED ORDER — ASPIRIN 81 MG PO CHEW
324.0000 mg | CHEWABLE_TABLET | Freq: Every day | ORAL | Status: DC
  Administered 2023-11-04 – 2023-11-06 (×3): 324 mg
  Filled 2023-11-03 (×3): qty 4

## 2023-11-03 MED ORDER — NICARDIPINE HCL IN NACL 20-0.86 MG/200ML-% IV SOLN: 0.0000 mg/h | INTRAVENOUS | Status: DC

## 2023-11-03 MED ORDER — PLASMA-LYTE A IV SOLN: INTRAVENOUS | Status: DC | PRN

## 2023-11-03 MED ORDER — SODIUM CHLORIDE 0.9 % IV SOLN
250.0000 mL | INTRAVENOUS | Status: AC
Start: 2023-11-04 — End: 2023-11-05
  Administered 2023-11-04: 250 mL via INTRAVENOUS

## 2023-11-03 MED ORDER — PROTAMINE SULFATE 10 MG/ML IV SOLN
INTRAVENOUS | Status: AC
  Filled 2023-11-03: qty 15

## 2023-11-03 MED ORDER — METOPROLOL TARTRATE 12.5 MG HALF TABLET: 12.5000 mg | ORAL_TABLET | Freq: Two times a day (BID) | ORAL | Status: DC

## 2023-11-03 MED ORDER — CHLORHEXIDINE GLUCONATE 0.12 % MT SOLN
15.0000 mL | OROMUCOSAL | Status: AC
  Administered 2023-11-03: 15 mL via OROMUCOSAL
  Filled 2023-11-03: qty 15

## 2023-11-03 MED ORDER — PROPOFOL 10 MG/ML IV BOLUS
INTRAVENOUS | Status: DC | PRN
  Administered 2023-11-03: 20 mg via INTRAVENOUS
  Administered 2023-11-03: 30 mg via INTRAVENOUS

## 2023-11-03 MED ORDER — NOREPINEPHRINE 4 MG/250ML-% IV SOLN
0.0000 ug/min | INTRAVENOUS | Status: DC
  Administered 2023-11-03: 22 ug/min via INTRAVENOUS

## 2023-11-03 MED ORDER — DEXMEDETOMIDINE HCL IN NACL 400 MCG/100ML IV SOLN
0.0000 ug/kg/h | INTRAVENOUS | Status: DC
  Administered 2023-11-03: 0.4 ug/kg/h via INTRAVENOUS
  Administered 2023-11-03: 0.7 ug/kg/h via INTRAVENOUS
  Administered 2023-11-04: 0.4 ug/kg/h via INTRAVENOUS
  Filled 2023-11-03 (×2): qty 100

## 2023-11-03 MED ORDER — LIDOCAINE HCL (CARDIAC) PF 100 MG/5ML IV SOSY
PREFILLED_SYRINGE | INTRAVENOUS | Status: DC | PRN
  Administered 2023-11-03: 100 mg via INTRATRACHEAL

## 2023-11-03 MED ORDER — ORAL CARE MOUTH RINSE: 15.0000 mL | Freq: Once | OROMUCOSAL | Status: AC

## 2023-11-03 MED ORDER — DEXTROSE 50 % IV SOLN: 0.0000 mL | INTRAVENOUS | Status: DC | PRN

## 2023-11-03 MED ORDER — PHENYLEPHRINE 80 MCG/ML (10ML) SYRINGE FOR IV PUSH (FOR BLOOD PRESSURE SUPPORT)
PREFILLED_SYRINGE | INTRAVENOUS | Status: DC | PRN
  Administered 2023-11-03: 40 ug via INTRAVENOUS
  Administered 2023-11-03 (×4): 80 ug via INTRAVENOUS
  Administered 2023-11-03: 40 ug via INTRAVENOUS
  Administered 2023-11-03 (×8): 80 ug via INTRAVENOUS
  Administered 2023-11-03: 40 ug via INTRAVENOUS
  Administered 2023-11-03 (×4): 80 ug via INTRAVENOUS

## 2023-11-03 MED ORDER — SODIUM CHLORIDE 0.9 % IV SOLN: INTRAVENOUS | Status: AC

## 2023-11-03 MED ORDER — PROPOFOL 10 MG/ML IV BOLUS
INTRAVENOUS | Status: DC | PRN
  Administered 2023-11-03 (×2): 30 mg via INTRAVENOUS
  Administered 2023-11-03: 70 mg via INTRAVENOUS

## 2023-11-03 MED ORDER — VANCOMYCIN HCL IN DEXTROSE 1-5 GM/200ML-% IV SOLN
1000.0000 mg | Freq: Once | INTRAVENOUS | Status: AC
  Administered 2023-11-03: 1000 mg via INTRAVENOUS
  Filled 2023-11-03: qty 200

## 2023-11-03 MED ORDER — POTASSIUM CHLORIDE 10 MEQ/50ML IV SOLN: 10.0000 meq | INTRAVENOUS | Status: AC

## 2023-11-03 MED ORDER — MIDAZOLAM HCL 2 MG/2ML IJ SOLN
2.0000 mg | INTRAMUSCULAR | Status: DC | PRN
  Administered 2023-11-03: 2 mg via INTRAVENOUS
  Filled 2023-11-03: qty 2

## 2023-11-03 MED ORDER — IPRATROPIUM-ALBUTEROL 0.5-2.5 (3) MG/3ML IN SOLN: 3.0000 mL | RESPIRATORY_TRACT | Status: DC | PRN

## 2023-11-03 MED ORDER — PHENYLEPHRINE HCL-NACL 20-0.9 MG/250ML-% IV SOLN
0.0000 ug/min | INTRAVENOUS | Status: DC
  Administered 2023-11-03: 20 ug/min via INTRAVENOUS

## 2023-11-03 MED ORDER — PROPOFOL 500 MG/50ML IV EMUL
INTRAVENOUS | Status: DC | PRN
  Administered 2023-11-03: 50 ug/kg/min via INTRAVENOUS

## 2023-11-03 MED ORDER — ONDANSETRON HCL 4 MG/2ML IJ SOLN
INTRAMUSCULAR | Status: DC | PRN
  Administered 2023-11-03: 4 mg via INTRAVENOUS

## 2023-11-03 MED ORDER — METHYLENE BLUE (ANTIDOTE) 1 % IV SOLN
2.0000 mg/kg | Freq: Once | INTRAVENOUS | Status: AC
  Administered 2023-11-03: 200 mg via INTRAVENOUS
  Filled 2023-11-03: qty 20

## 2023-11-03 MED ORDER — ROCURONIUM BROMIDE 10 MG/ML (PF) SYRINGE
PREFILLED_SYRINGE | INTRAVENOUS | Status: DC | PRN
  Administered 2023-11-03: 50 mg via INTRAVENOUS

## 2023-11-03 MED ORDER — FENTANYL CITRATE (PF) 250 MCG/5ML IJ SOLN
INTRAMUSCULAR | Status: DC | PRN
  Administered 2023-11-03: 50 ug via INTRAVENOUS
  Administered 2023-11-03 (×5): 100 ug via INTRAVENOUS
  Administered 2023-11-03: 200 ug via INTRAVENOUS
  Administered 2023-11-03: 50 ug via INTRAVENOUS
  Administered 2023-11-03 (×3): 100 ug via INTRAVENOUS
  Administered 2023-11-03: 150 ug via INTRAVENOUS

## 2023-11-03 MED ORDER — MIDAZOLAM HCL (PF) 5 MG/ML IJ SOLN
INTRAMUSCULAR | Status: DC | PRN
  Administered 2023-11-03: 2 mg via INTRAVENOUS

## 2023-11-03 MED ORDER — VISTASEAL 10 ML SINGLE DOSE KIT
PACK | CUTANEOUS | Status: DC | PRN
  Administered 2023-11-03: 10 mL via TOPICAL

## 2023-11-03 MED ORDER — VASOPRESSIN 20 UNITS/100 ML INFUSION FOR SHOCK
0.0000 [IU]/min | INTRAVENOUS | Status: DC
  Administered 2023-11-03 – 2023-11-04 (×2): 0.04 [IU]/min via INTRAVENOUS
  Filled 2023-11-03 (×2): qty 100

## 2023-11-03 MED ORDER — DULOXETINE HCL 60 MG PO CPEP: 60.0000 mg | ORAL_CAPSULE | Freq: Two times a day (BID) | ORAL | Status: DC

## 2023-11-03 MED ORDER — ACETAMINOPHEN 500 MG PO TABS
1000.0000 mg | ORAL_TABLET | Freq: Four times a day (QID) | ORAL | Status: AC
  Administered 2023-11-07 – 2023-11-08 (×5): 1000 mg via ORAL
  Filled 2023-11-03 (×5): qty 2

## 2023-11-03 MED ORDER — DOCUSATE SODIUM 100 MG PO CAPS: 200.0000 mg | ORAL_CAPSULE | Freq: Every day | ORAL | Status: DC

## 2023-11-03 MED ORDER — PANTOPRAZOLE SODIUM 40 MG IV SOLR: 40.0000 mg | Freq: Every day | INTRAVENOUS | Status: DC

## 2023-11-03 MED ORDER — MIDAZOLAM HCL (PF) 5 MG/ML IJ SOLN
INTRAMUSCULAR | Status: DC | PRN
  Administered 2023-11-03 (×2): 1 mg via INTRAVENOUS

## 2023-11-03 MED ORDER — ARFORMOTEROL TARTRATE 15 MCG/2ML IN NEBU
15.0000 ug | INHALATION_SOLUTION | Freq: Two times a day (BID) | RESPIRATORY_TRACT | Status: DC
  Administered 2023-11-03: 15 ug via RESPIRATORY_TRACT
  Filled 2023-11-03: qty 2

## 2023-11-03 MED ORDER — PROTAMINE SULFATE 10 MG/ML IV SOLN
INTRAVENOUS | Status: AC
  Filled 2023-11-03: qty 25

## 2023-11-03 MED ORDER — CALCIUM CHLORIDE 10 % IV SOLN
2.0000 g | Freq: Once | INTRAVENOUS | Status: AC
  Administered 2023-11-03: 2 g via INTRAVENOUS

## 2023-11-03 MED ORDER — ASPIRIN 325 MG PO TBEC
325.0000 mg | DELAYED_RELEASE_TABLET | Freq: Every day | ORAL | Status: DC
  Administered 2023-11-08 – 2023-11-09 (×2): 325 mg via ORAL
  Filled 2023-11-03 (×3): qty 1

## 2023-11-03 MED ORDER — MORPHINE SULFATE (PF) 2 MG/ML IV SOLN: 1.0000 mg | INTRAVENOUS | Status: DC | PRN

## 2023-11-03 MED ORDER — BISACODYL 5 MG PO TBEC
10.0000 mg | DELAYED_RELEASE_TABLET | Freq: Every day | ORAL | Status: DC
  Administered 2023-11-04 – 2023-11-20 (×10): 10 mg via ORAL
  Administered 2023-11-21 – 2023-11-22 (×2): 5 mg via ORAL
  Administered 2023-11-23 – 2023-11-25 (×3): 10 mg via ORAL
  Filled 2023-11-03 (×17): qty 2

## 2023-11-03 MED ORDER — BUPROPION HCL ER (SR) 100 MG PO TB12: 100.0000 mg | ORAL_TABLET | Freq: Two times a day (BID) | ORAL | Status: DC

## 2023-11-03 MED ORDER — HEPARIN SODIUM (PORCINE) 1000 UNIT/ML IJ SOLN
INTRAMUSCULAR | Status: AC
  Filled 2023-11-03: qty 1

## 2023-11-03 MED ORDER — FENTANYL CITRATE (PF) 250 MCG/5ML IJ SOLN
INTRAMUSCULAR | Status: AC
Start: 2023-11-03 — End: ?
  Filled 2023-11-03: qty 5

## 2023-11-03 MED ORDER — REVEFENACIN 175 MCG/3ML IN SOLN
175.0000 ug | Freq: Every day | RESPIRATORY_TRACT | Status: DC
  Administered 2023-11-03 – 2023-11-24 (×21): 175 ug via RESPIRATORY_TRACT
  Filled 2023-11-03 (×23): qty 3

## 2023-11-03 MED ORDER — ROCURONIUM BROMIDE 10 MG/ML (PF) SYRINGE
PREFILLED_SYRINGE | INTRAVENOUS | Status: DC | PRN
  Administered 2023-11-03: 50 mg via INTRAVENOUS
  Administered 2023-11-03: 100 mg via INTRAVENOUS
  Administered 2023-11-03: 50 mg via INTRAVENOUS

## 2023-11-03 MED ORDER — MAGNESIUM SULFATE 4 GM/100ML IV SOLN
4.0000 g | Freq: Once | INTRAVENOUS | Status: AC
  Administered 2023-11-03: 4 g via INTRAVENOUS
  Filled 2023-11-03: qty 100

## 2023-11-03 MED ORDER — PROTAMINE SULFATE 10 MG/ML IV SOLN
INTRAVENOUS | Status: DC | PRN
  Administered 2023-11-03: 400 mg via INTRAVENOUS

## 2023-11-03 MED ORDER — VASOPRESSIN 20 UNITS/100 ML INFUSION FOR SHOCK
INTRAVENOUS | Status: AC
  Administered 2023-11-03: 0.03 [IU]/min via INTRAVENOUS
  Filled 2023-11-03: qty 100

## 2023-11-03 MED ORDER — LIDOCAINE 2% (20 MG/ML) 5 ML SYRINGE
INTRAMUSCULAR | Status: AC
Start: 2023-11-03 — End: ?
  Filled 2023-11-03: qty 5

## 2023-11-03 MED ORDER — IPRATROPIUM-ALBUTEROL 0.5-2.5 (3) MG/3ML IN SOLN
3.0000 mL | RESPIRATORY_TRACT | Status: DC
  Administered 2023-11-03 – 2023-11-09 (×33): 3 mL via RESPIRATORY_TRACT
  Filled 2023-11-03 (×33): qty 3

## 2023-11-03 MED FILL — Potassium Chloride Inj 2 mEq/ML: INTRAVENOUS | Qty: 40 | Status: AC

## 2023-11-03 MED FILL — Lidocaine HCl Local Preservative Free (PF) Inj 2%: INTRAMUSCULAR | Qty: 14 | Status: AC

## 2023-11-03 MED FILL — Heparin Sodium (Porcine) Inj 1000 Unit/ML: Qty: 1000 | Status: AC

## 2023-11-03 SURGICAL SUPPLY — 44 items
CANISTER SUCTION 3000ML PPV (SUCTIONS) ×2 IMPLANT
CLIP TI MEDIUM 24 (CLIP) IMPLANT
CLIP TI WIDE RED SMALL 24 (CLIP) IMPLANT
DRAPE INCISE IOBAN 66X45 STRL (DRAPES) ×2 IMPLANT
DRAPE SRG 135X102X78XABS (DRAPES) ×2 IMPLANT
DRAPE WARM FLUID 44X44 (DRAPES) ×2 IMPLANT
DRSG AQUACEL AG ADV 3.5X10 (GAUZE/BANDAGES/DRESSINGS) IMPLANT
ELECTRODE BLDE 4.0 EZ CLN MEGD (MISCELLANEOUS) IMPLANT
ELECTRODE REM PT RTRN 9FT ADLT (ELECTROSURGICAL) ×4 IMPLANT
FELT TEFLON 1X6 (MISCELLANEOUS) ×4 IMPLANT
GAUZE SPONGE 4X4 12PLY STRL (GAUZE/BANDAGES/DRESSINGS) IMPLANT
GLOVE BIO SURGEON STRL SZ7.5 (GLOVE) IMPLANT
GOWN STRL REUS W/ TWL LRG LVL3 (GOWN DISPOSABLE) ×8 IMPLANT
GOWN STRL REUS W/ TWL XL LVL3 (GOWN DISPOSABLE) ×4 IMPLANT
HEMOSTAT POWDER SURGIFOAM 1G (HEMOSTASIS) ×6 IMPLANT
KIT BASIN OR (CUSTOM PROCEDURE TRAY) ×2 IMPLANT
KIT TURNOVER KIT B (KITS) ×2 IMPLANT
NS IRRIG 1000ML POUR BTL (IV SOLUTION) ×10 IMPLANT
PACK E OPEN HEART (SUTURE) ×2 IMPLANT
PACK OPEN HEART (CUSTOM PROCEDURE TRAY) ×2 IMPLANT
PAD ARMBOARD POSITIONER FOAM (MISCELLANEOUS) ×4 IMPLANT
PAD ELECT DEFIB RADIOL ZOLL (MISCELLANEOUS) ×2 IMPLANT
POSITIONER HEAD DONUT 9IN (MISCELLANEOUS) ×2 IMPLANT
SUT MNCRL AB 3-0 PS2 18 (SUTURE) ×4 IMPLANT
SUT PDS AB 1 CTX 36 (SUTURE) ×4 IMPLANT
SUT PROLENE 2 0 SH DA (SUTURE) IMPLANT
SUT PROLENE 3 0 SH DA (SUTURE) ×2 IMPLANT
SUT PROLENE 3 0 SH1 36 (SUTURE) IMPLANT
SUT PROLENE 4 0 SH DA (SUTURE) IMPLANT
SUT PROLENE 4-0 RB1 .5 CRCL 36 (SUTURE) IMPLANT
SUT PROLENE 5 0 C 1 36 (SUTURE) ×6 IMPLANT
SUT PROLENE 6 0 C 1 30 (SUTURE) IMPLANT
SUT PROLENE 8 0 BV175 6 (SUTURE) IMPLANT
SUT PROLENE BLUE 7 0 (SUTURE) ×2 IMPLANT
SUT PROLENE POLY MONO (SUTURE) IMPLANT
SUT STEEL 6MS V (SUTURE) ×4 IMPLANT
SYSTEM SAHARA CHEST DRAIN ATS (WOUND CARE) ×2 IMPLANT
TAPE CLOTH SURG 4X10 WHT LF (GAUZE/BANDAGES/DRESSINGS) IMPLANT
TAPE PAPER 2X10 WHT MICROPORE (GAUZE/BANDAGES/DRESSINGS) IMPLANT
TOWEL GREEN STERILE (TOWEL DISPOSABLE) ×2 IMPLANT
TOWEL GREEN STERILE FF (TOWEL DISPOSABLE) ×2 IMPLANT
TUBE CONNECTING 20X1/4 (TUBING) IMPLANT
UNDERPAD 30X36 HEAVY ABSORB (UNDERPADS AND DIAPERS) ×2 IMPLANT
WATER STERILE IRR 1000ML POUR (IV SOLUTION) ×4 IMPLANT

## 2023-11-03 SURGICAL SUPPLY — 84 items
BAG DECANTER FOR FLEXI CONT (MISCELLANEOUS) ×2 IMPLANT
BLADE CLIPPER SURG (BLADE) ×2 IMPLANT
BLADE STERNUM SYSTEM 6 (BLADE) ×2 IMPLANT
BLADE SURG 11 STRL SS (BLADE) IMPLANT
BNDG ELASTIC 4INX 5YD STR LF (GAUZE/BANDAGES/DRESSINGS) IMPLANT
BNDG ELASTIC 4X5.8 VLCR STR LF (GAUZE/BANDAGES/DRESSINGS) ×2 IMPLANT
BNDG ELASTIC 6INX 5YD STR LF (GAUZE/BANDAGES/DRESSINGS) ×2 IMPLANT
BNDG GAUZE DERMACEA FLUFF 4 (GAUZE/BANDAGES/DRESSINGS) ×2 IMPLANT
CANISTER SUCT 3000ML PPV (MISCELLANEOUS) ×4 IMPLANT
CANNULA MC2 2 STG 29/37 NON-V (CANNULA) ×2 IMPLANT
CANNULA NON VENT 20FR 12 (CANNULA) ×2 IMPLANT
CATH HEART VENT LEFT (CATHETERS) ×2 IMPLANT
CATH ROBINSON RED A/P 18FR (CATHETERS) ×10 IMPLANT
CLIP RETRACTION 3.0MM CORONARY (MISCELLANEOUS) IMPLANT
CLIP TI MEDIUM 24 (CLIP) IMPLANT
CLIP TI WIDE RED SMALL 24 (CLIP) IMPLANT
CNTNR URN SCR LID CUP LEK RST (MISCELLANEOUS) ×2 IMPLANT
CONNECTOR BLAKE 2:1 CARIO BLK (MISCELLANEOUS) ×2 IMPLANT
CONTAINER PROTECT SURGISLUSH (MISCELLANEOUS) ×4 IMPLANT
DERMABOND ADVANCED .7 DNX6 (GAUZE/BANDAGES/DRESSINGS) IMPLANT
DEVICE ATRICLIP LAA PRCLPII 45 (Clip) IMPLANT
DEVICE SUT CK QUICK LOAD INDV (Prosthesis & Implant Heart) IMPLANT
DEVICE SUT CK QUICK LOAD MINI (Prosthesis & Implant Heart) IMPLANT
DRAIN CHANNEL 19F RND (DRAIN) ×6 IMPLANT
DRAIN CONNECTOR BLAKE 1:1 (MISCELLANEOUS) IMPLANT
DRAPE INCISE IOBAN 66X45 STRL (DRAPES) IMPLANT
DRAPE SRG 135X102X78XABS (DRAPES) ×2 IMPLANT
DRAPE WARM FLUID 44X44 (DRAPES) ×2 IMPLANT
DRSG AQUACEL AG ADV 3.5X10 (GAUZE/BANDAGES/DRESSINGS) ×2 IMPLANT
ELECTRODE BLDE 4.0 EZ CLN MEGD (MISCELLANEOUS) ×2 IMPLANT
ELECTRODE REM PT RTRN 9FT ADLT (ELECTROSURGICAL) ×4 IMPLANT
FELT TEFLON 1X6 (MISCELLANEOUS) ×4 IMPLANT
GAUZE SPONGE 4X4 12PLY STRL (GAUZE/BANDAGES/DRESSINGS) ×6 IMPLANT
GLOVE BIO SURGEON STRL SZ7 (GLOVE) ×4 IMPLANT
GLOVE BIOGEL M STRL SZ7.5 (GLOVE) ×4 IMPLANT
GOWN STRL REUS W/ TWL LRG LVL3 (GOWN DISPOSABLE) ×8 IMPLANT
GOWN STRL REUS W/ TWL XL LVL3 (GOWN DISPOSABLE) ×4 IMPLANT
HEMOSTAT POWDER SURGIFOAM 1G (HEMOSTASIS) ×4 IMPLANT
HEMOSTAT SURGICEL 2X14 (HEMOSTASIS) ×2 IMPLANT
INSERT SUTURE HOLDER (MISCELLANEOUS) ×4 IMPLANT
KIT BASIN OR (CUSTOM PROCEDURE TRAY) ×4 IMPLANT
KIT SUCTION CATH 14FR (SUCTIONS) ×2 IMPLANT
KIT SUT CK MINI COMBO 4X17 (Prosthesis & Implant Heart) IMPLANT
KIT TURNOVER KIT B (KITS) ×4 IMPLANT
KIT VASOVIEW HEMOPRO 2 VH 4000 (KITS) ×2 IMPLANT
LEAD PACING MYOCARDI (MISCELLANEOUS) ×2 IMPLANT
LINE VENT (MISCELLANEOUS) IMPLANT
MARKER DISTAL GRAFT W/ HOLDER (MISCELLANEOUS) ×6 IMPLANT
NS IRRIG 1000ML POUR BTL (IV SOLUTION) ×10 IMPLANT
ORGANIZER SUTURE GABBAY-FRATER (MISCELLANEOUS) ×2 IMPLANT
PACK E OPEN HEART (SUTURE) ×2 IMPLANT
PACK OPEN HEART (CUSTOM PROCEDURE TRAY) ×2 IMPLANT
PAD ARMBOARD POSITIONER FOAM (MISCELLANEOUS) ×8 IMPLANT
PAD ELECT DEFIB RADIOL ZOLL (MISCELLANEOUS) ×2 IMPLANT
PENCIL BUTTON HOLSTER BLD 10FT (ELECTRODE) ×2 IMPLANT
POSITIONER HEAD DONUT 9IN (MISCELLANEOUS) ×4 IMPLANT
PUNCH AORTIC ROTATE 4.0MM (MISCELLANEOUS) ×2 IMPLANT
SET MPS 3-ND DEL (MISCELLANEOUS) IMPLANT
SPONGE T-LAP 18X18 ~~LOC~~+RFID (SPONGE) IMPLANT
SUPPORT HEART JANKE-BARRON (MISCELLANEOUS) ×2 IMPLANT
SUT BONE WAX W31G (SUTURE) ×2 IMPLANT
SUT EB EXC GRN/WHT 2-0 V-5 (SUTURE) ×4 IMPLANT
SUT ETHIBOND X763 2 0 SH 1 (SUTURE) ×4 IMPLANT
SUT MNCRL AB 3-0 PS2 18 (SUTURE) ×4 IMPLANT
SUT PDS AB 1 CTX 36 (SUTURE) ×4 IMPLANT
SUT PROLENE 3 0 SH DA (SUTURE) IMPLANT
SUT PROLENE 3 0 SH1 36 (SUTURE) ×2 IMPLANT
SUT PROLENE 4 0 SH DA (SUTURE) ×2 IMPLANT
SUT PROLENE 4-0 RB1 .5 CRCL 36 (SUTURE) ×6 IMPLANT
SUT PROLENE 5 0 C 1 36 (SUTURE) ×6 IMPLANT
SUT PROLENE 7 0 BV 1 (SUTURE) IMPLANT
SUT PROLENE 7 0 BV1 MDA (SUTURE) ×2 IMPLANT
SUT STEEL 6MS V (SUTURE) ×4 IMPLANT
SUT VIC AB 2-0 CT1 TAPERPNT 27 (SUTURE) IMPLANT
SYSTEM SAHARA CHEST DRAIN ATS (WOUND CARE) ×2 IMPLANT
TAPE CLOTH SURG 4X10 WHT LF (GAUZE/BANDAGES/DRESSINGS) IMPLANT
TAPE PAPER 2X10 WHT MICROPORE (GAUZE/BANDAGES/DRESSINGS) IMPLANT
TOWEL GREEN STERILE (TOWEL DISPOSABLE) ×2 IMPLANT
TOWEL GREEN STERILE FF (TOWEL DISPOSABLE) ×2 IMPLANT
TRAY FOLEY SLVR 16FR TEMP STAT (SET/KITS/TRAYS/PACK) ×2 IMPLANT
TUBING LAP HI FLOW INSUFFLATIO (TUBING) ×2 IMPLANT
UNDERPAD 30X36 HEAVY ABSORB (UNDERPADS AND DIAPERS) ×2 IMPLANT
VALVE AORTIC SZ23 INSP/RESIL (Prosthesis & Implant Heart) IMPLANT
WATER STERILE IRR 1000ML POUR (IV SOLUTION) ×4 IMPLANT

## 2023-11-03 NOTE — Progress Notes (Signed)
 11/03/2023 Ongoing shock postop, gave more blood FFP; drain output slowed then picked back up again. Giving total 4prbc/8ffp, calcium .  TEG looks okay.  Vent pressures also were going up: redo CXR stable.  Bedside echo no obvious effusion, EF down.  Coox down combination anemia cardioplegia: neo switched for epi, levo to take spot of neo.  Dr. Deloise Ferries aware and will come evaluate patient.  Ardelle Kos MD PCCM

## 2023-11-03 NOTE — Anesthesia Procedure Notes (Signed)
 Arterial Line Insertion Start/End5/12/2023 7:15 AM, 11/03/2023 7:20 AM Performed by: Erin Havers, MD, Grier Leber, CRNA, CRNA  Patient location: Pre-op. Preanesthetic checklist: patient identified, IV checked, site marked, risks and benefits discussed, surgical consent, monitors and equipment checked, pre-op evaluation, timeout performed and anesthesia consent Lidocaine  1% used for infiltration Right, radial was placed Catheter size: 20 G Hand hygiene performed  and maximum sterile barriers used   Attempts: 1 Procedure performed without using ultrasound guided technique. Following insertion, dressing applied and Biopatch. Post procedure assessment: normal and unchanged  Patient tolerated the procedure well with no immediate complications.

## 2023-11-03 NOTE — Anesthesia Procedure Notes (Signed)
 Procedure Name: Intubation Date/Time: 11/03/2023 8:59 AM  Performed by: Grier Leber, CRNAPre-anesthesia Checklist: Patient identified, Emergency Drugs available, Suction available and Patient being monitored Patient Re-evaluated:Patient Re-evaluated prior to induction Oxygen  Delivery Method: Circle System Utilized Preoxygenation: Pre-oxygenation with 100% oxygen  Induction Type: IV induction Ventilation: Mask ventilation without difficulty and Oral airway inserted - appropriate to patient size Laryngoscope Size: Glidescope and 3 Grade View: Grade I Tube type: Oral Tube size: 8.0 mm Number of attempts: 1 Airway Equipment and Method: Stylet and Oral airway Placement Confirmation: ETT inserted through vocal cords under direct vision, positive ETCO2 and breath sounds checked- equal and bilateral Secured at: 22 cm Tube secured with: Tape Dental Injury: Teeth and Oropharynx as per pre-operative assessment

## 2023-11-03 NOTE — Discharge Summary (Signed)
 Physician Discharge Summary  Patient ID: Katelyn Cline MRN: [id number] DOB/AGE: 30-Jan-1961 63 y.o.  Admit date: 10/25/2023 Discharge date: 11/25/2023  Admission Diagnoses:  Multivessel coronary artery disease Unstable angina pectoris Moderate to severe aortic stenosis Chronic hypoxic respiratory failure, on home oxygen  therapy Anemia Chronic bronchitis Peripheral artery disease with lower extremity claudication Degenerative lumbar disc disease History of depression History of fibromyalgia Peripheral neuropathy History of tobacco abuse Obesity Hypertension Polycythemia vera History of malignant melanoma   Discharge Diagnoses:   Multivessel coronary artery disease Unstable angina pectoris Moderate to severe aortic stenosis Chronic hypoxic respiratory failure, on home oxygen  therapy Anemia Chronic bronchitis Peripheral artery disease with lower extremity claudication Degenerative lumbar disc disease History of depression History of fibromyalgia Peripheral neuropathy History of tobacco abuse Obesity Hypertension Polycythemia vera History of malignant melanoma Status post coronary bypass grafting x 3 Status post bioprosthetic aortic valve replacement Status post application of left atrial appendage clip   Discharged Condition: Stable  Primary Care: Christel Cousins, MD Primary Cardiologist:Jonathan Katheryne Pane, MD    History of Present Illness:   Katelyn Cline is a 63 year old female with a past history of hypertension, dyslipidemia, type 2 diabetes mellitus, class I obesity, 100-pack-year smoking history, chronic pain syndrome with fibromyalgia and degenerative disc disease, history of atrial fibrillation status post cardioversion, and history of metastatic melanoma.  She also has history of an 11 mm posterior left lower lobe pulmonary nodule recently evaluated with a PET scan and shown to have low metabolic activity.  She has a known history of COPD and is  on supplemental oxygen  24 hours a day. Katelyn Cline has been undergoing cardiac evaluation over the past several months for initial complaints of shortness of breath apparently starting back in September 2024.  She had a calcium  scoring coronary CT in January of this year revealing an elevated calcium  score of 1200% distribution of significant calcification in all 3 coronary systems.  An echocardiogram showed normal LV systolic function with mild concentric left ventricular hypertrophy and moderate aortic stenosis with an aortic valve area of 1.0 cm.  This found to have left heart catheterization on 10/01/2023 demonstrating severe multivessel coronary artery disease.  Mr. Usery was referred to Dr. Deloise Ferries for outpatient evaluation of her coronary disease and moderate aortic stenosis.  She was scheduled to see him in the office next week but presented to the emergency room late yesterday after experiencing 4/10 squeezing chest discomfort radiating to her left arm with more intense chest pain.  EKG in the emergency room showed diffuse ST depressions in leads II , III, aVF, and V4 through V6.  High-sensitivity troponin was mildly elevated at 34 with subsequent assay about 2 hours later measured at 45.  Katelyn Cline was treated with aspirin  and started on a heparin  although the the heparin  is now on hold due to anemia and report of recent dark tarry stools.  Katelyn Cline is being admitted to the hospital for further evaluation. CT surgery has been asked to initiate evaluation for potential coronary bypass grafting and aortic valve replacement during this admission.   Katelyn Cline is currently sitting up in bed eating lunch in the emergency room.  She denies any chest pain currently.  She is unaware of any further atrial fibrillation since her cardioversion a few months ago.  She is status post bronchoscopy with right video-assisted thoracoscopy for wedge resection of right upper lobe mass for metastatic melanoma  by Dr. Nicanor Barge in 2014.    Hospital Course:  Katelyn Cline remained stable following admission to the hospital.  She had no further chest pain and no evidence of acute GI bleeding.  Her hemoglobin and hematocrit improved after admission without transfusion.  Coronary bypass grafting and aortic valve replacement were discussed with her by Dr. Deloise Ferries and she decided to proceed with surgery.  She was taken to the operating room electively on 11/03/2023 where CABG x 3 was accomplished along with aortic valve replacement utilizing a 23 mm Edwards Inspiris Resilia bioprosthetic valve.  Following procedure, she separated from cardiopulmonary bypass without difficulty.  She was transferred to the ICU in stable condition.  Over the next several hours, she required escalating doses of vasopressor support and had significant bloody drainage from the surgical drains.  She was returned to the operating room during the evening of  11/03/2023 for exploration.  No obvious source of bleeding was found.  The left hemothorax was evacuated.  All of the grafts and suture lines were found to be hemostatic.  She was returned to the surgical ICU.  Blood pressure was supported with epinephrine  and vasopressin .  The chest tube drainage diminished to minimal over the next several hours.  The critical care and the advanced heart failure teams assisted with management while in the ICU.  She remained intubated.  On the second postoperative day, she was placed on CPAP and all sedation was withheld.  She moves all extremities spontaneously and follows commands but she was not felt to be awake and alert enough safe extubation.  Ventilator support continued and she was started on empiric antibiotics.  The pulmonary critical care medicine team felt ready for extubation to BiPAP on postop day 4.  She was diuresed aggressively.  Empiric Maxipime  was continued while awaiting respiratory cultures.  Chest tubes were removed on postop day 4. She was felt  stable for transfer from the ICU to 4E for further convalescence. She continued to maintain sinus rhythm on Lopressor  25 mg bid and Hydralazine  10 mg tid. As discussed with Dr. Deloise Ferries, she was restarted on Apixaban  on 05/13. She had hypoglycemia the am of 05/13 so scheduled Insulin  was decreased. She will be restarted on Metformin  at discharge.  She has been tolerating a diet and has had a bowel movement. Sternal and RLE wounds are clean, dry, healing without signs of infection. She is on 3 liters of oxygen , which she was on oxygen   prior to admission. PA/LAT CXR done 05/13 showed a moderate left pleural effusion. Apixaban  was stopped 05/14. IR did a left thoracentesis and removed 1 liter of bloody fluid. Of note, she has had leukocytosis and thrombocytosis. WBC increased to 27,500 and 958,000. I discussed with heme/oncology. This is likely a stress response. Per recommendation, I ordered an iron  study and ferritin. Results showed all within normal except iron  slightly low at 25. She does not appear to need IV Venofer  at this time. She was on Hydrea  prior to surgery and will discuss when to resume this. Hydralazine  was stopped and she was restarted on low dose Lisinopril  for BP control.  She was restarted on Apixaban . She had more episode of hypoglycemia with scheduled Insulin  so this was stopped. She will be restarted on Metformin  once glucose allows. She did have confusion on 05/14. She had no focal neuro deficit and was not hypoglycemic at that time. Oxy stopped and UA was checked as she has a history of UTIs;UA showed no sign of infection. WBC and platelets gradually decreased. Regarding her diabetes management, SS was  changed and scheduled Insulin  was stopped due to rather persistent hypoglycemia. She will be started on Metformin  at discharge.  Cardiology continued to diurese her with IV Lasix  (80 mg bid then 40 mg IV bid) with excellent diuresis. PA/LAT CXR done 05/16 showed small left pleural effusion  and atelectasis. WBC decreased to 17,400 and platelets were 1,221,000 (history of polycythemia vera). I did discuss with Dr. Salomon Cree via secure chat on 05/19 and he recommended Hydrea  500 mg every other day in addition to current dose of Hydrea . Insurance denied CIR so an appeal was requested but again denied. She declined transition to a SNF preferring to discharge to home with hter son with home health services. This was arranged.  She was provided a rolling walker and 3 in 1 BSC. She already had home oxygen  arranged prior to this admission.   She is felt stable for discharge to home on 11/25/23.  Consults: cardiology, pulmonary/intensive care, and IR  Significant Diagnostic Studies:  Narrative & Impression  CLINICAL DATA:  Status post prior CTs.   EXAM: CHEST  1 VIEW   COMPARISON:  Chest radiograph dated 11/09/2023.   FINDINGS: Cardiomegaly with vascular congestion. Small left pleural effusion and left lung base atelectasis or infiltrate. No pneumothorax. Atherosclerotic calcification of the aorta. Median sternotomy wires and mechanical cardiac valve. No acute osseous pathology.   IMPRESSION: 1. Cardiomegaly with vascular congestion. 2. Small left pleural effusion and left lung base atelectasis or infiltrate.   Treatments: Surgery:  CABG X 3.  LIMA LAD, RSVG PDA, OM1   Aortic valve replacement with a 23mm Inspiris valve Placement of 45mm Atriclip  Endoscopic greater saphenous vein harvest on the right by Dr. Deloise Ferries on 11/03/2023.  US  guided left thoracentesis which yielded 1.0 liters of bloody fluid by IR on 11/10/2023   Discharge Exam: Blood pressure (!) 131/56, pulse (!) 54, temperature 98 F (36.7 C), temperature source Oral, resp. rate 14, height 5' 8.5" (1.74 m), weight 89.1 kg, SpO2 98%.  Cardiovascular: RRR Pulmonary: Clear Abdomen: Soft, obese, non tender, bowel sounds present. Extremities: no edema or tenderness.  Wounds: Clean and dry.  No erythema or signs of  infection.  Disposition: Discharged to home with home health services.  Discharge Instructions     Amb Referral to Cardiac Rehabilitation   Complete by: As directed    Diagnosis: CABG   CABG X ___: 3   After initial evaluation and assessments completed: Virtual Based Care may be provided alone or in conjunction with Phase 2 Cardiac Rehab based on patient barriers.: Yes   Intensive Cardiac Rehabilitation (ICR) [facility] location only OR Traditional Cardiac Rehabilitation (TCR) *If criteria for ICR are not met will enroll in TCR ([facility] only): Yes      Allergies as of 11/25/2023       Reactions   Contrast Media [iodinated Contrast Media] Anaphylaxis   CAT Scan Conrast    Erythromycin    Stomach pain   Flagyl [metronidazole] Other (See Comments)   Generalized pain, caused sickness   Other    Iodine Dye   Penicillins Other (See Comments)   Patient was an infant, no idea of reaction. Tolerates Keflex . Did it involve swelling of the face/tongue/throat, SOB, or low BP? Unknown Did it involve sudden or severe rash/hives, skin peeling, or any reaction on the inside of your mouth or nose? Unknown Did you need to seek medical attention at a hospital or doctor's office? Unknown When did it last happen?      infant  If all above answers are "NO", may proceed with cephalosporin use. CHILDHOOD ALLERGY   Tetracyclines & Related Other (See Comments)   GI side effects        Medication List     STOP taking these medications    diltiazem  120 MG 24 hr capsule Commonly known as: CARDIZEM  CD   hydrochlorothiazide  25 MG tablet Commonly known as: HYDRODIURIL    isosorbide  mononitrate 30 MG 24 hr tablet Commonly known as: IMDUR    nitroGLYCERIN  0.4 MG SL tablet Commonly known as: NITROSTAT    potassium chloride  SA 20 MEQ tablet Commonly known as: KLOR-CON  M       TAKE these medications    acetaminophen  325 MG tablet Commonly known as: TYLENOL  Take 2 tablets (650 mg total) by mouth every 6  (six) hours as needed for headache or mild pain (pain score 1-3).   albuterol  108 (90 Base) MCG/ACT inhaler Commonly known as: VENTOLIN  HFA Inhale 2 puffs into the lungs every 6 (six) hours as needed for wheezing or shortness of breath (as needed).   aspirin  EC 81 MG tablet Take 1 tablet (81 mg total) by mouth daily. Swallow whole.   atorvastatin  80 MG tablet Commonly known as: LIPITOR Take 1 tablet by mouth daily at bedtime.   Blood Glucose System Pak Kit Please dispense based on patient and insurance preference. Use as directed to monitor FSBS 2x daily. Dx: E11.9   BLOOD GLUCOSE TEST STRIPS Strp Please dispense based on patient and insurance preference. Use as directed to monitor FSBS 2x daily. Dx: E11.9   Accu-Chek Guide test strip Generic drug: glucose blood Use as instructed   buPROPion  ER 100 MG 12 hr tablet Commonly known as: WELLBUTRIN  SR Take 1 tablet by mouth every morning and take 1 tablet by mouth every day at bedtime.   DULoxetine  60 MG capsule Commonly known as: CYMBALTA  Take 1 capsule by mouth twice daily. Needs appointment. What changed: See the new instructions.   Eliquis  5 MG Tabs tablet Generic drug: apixaban  Take 1 tablet (5 mg total) by mouth 2 (two) times daily.   empagliflozin  10 MG Tabs tablet Commonly known as: JARDIANCE  Take 1 tablet (10 mg total) by mouth daily.   ezetimibe  10 MG tablet Commonly known as: ZETIA  Take 1 tablet by mouth every morning.   fenofibrate  145 MG tablet Commonly known as: TRICOR  Take 1 tablet by mouth daily at bedtime.   fish oil-omega-3 fatty acids  1000 MG capsule Take 2 capsules (2 g total) by mouth 2 (two) times daily.   furosemide  40 MG tablet Commonly known as: LASIX  Take 1 tablet (40 mg total) by mouth daily. What changed:  medication strength how much to take when to take this reasons to take this   hydroxyurea  500 MG capsule Commonly known as: HYDREA  Take 2 capsules by mouth daily at bedtime with  an additional 1 capsule on Monday and Thursday. What changed:  how much to take how to take this when to take this additional instructions   lisinopril  10 MG tablet Commonly known as: ZESTRIL  Take 1 tablet (10 mg total) by mouth at bedtime. What changed:  medication strength how much to take   metFORMIN  1000 MG tablet Commonly known as: GLUCOPHAGE  Take 1 tablet by mouth every morning and take 1 tablet by mouth every day at bedtime.   metoprolol  tartrate 25 MG tablet Commonly known as: LOPRESSOR  Take 0.5 tablets (12.5 mg total) by mouth 2 (two) times daily.   Microlet Lancets Misc Please dispense  based on patient and insurance preference. Use as directed to monitor FSBS 2x daily. Dx: E11.9   nystatin  powder Commonly known as: MYCOSTATIN /NYSTOP  Apply 1 Application topically 3 (three) times daily. PA Case: [id number] Approved 06/30/2019- 06/28/2020 What changed:  when to take this reasons to take this   nystatin  cream Commonly known as: MYCOSTATIN  Apply topically twice daily. What changed:  how much to take when to take this reasons to take this   omega-3 acid ethyl esters 1 g capsule Commonly known as: LOVAZA  Take 1 capsule (1 g total) by mouth 2 (two) times daily.   oxyCODONE -acetaminophen  10-325 MG tablet Commonly known as: PERCOCET Take 1 tablet by mouth every 4 (four) hours as needed for severe pain (pain score 7-10).   senna 8.6 MG Tabs tablet Commonly known as: SENOKOT Take 2 tablets by mouth daily as needed for mild constipation.   spironolactone  25 MG tablet Commonly known as: ALDACTONE  Take 0.5 tablets (12.5 mg total) by mouth daily.   Vitamin D  (Ergocalciferol ) 1.25 MG (50000 UNIT) Caps capsule Commonly known as: DRISDOL  Take 1 capsule by mouth on Sundays at bedtime.               Durable Medical Equipment  (From admission, onward)           Start     Ordered   11/17/23 0751  For home use only DME 3 n 1  Once        11/17/23 0750    11/17/23 0751  For home use only DME Walker rolling  Once       Question Answer Comment  Walker: With 5 Inch Wheels   Patient needs a walker to treat with the following condition Physical deconditioning      05 /21/25 0750            Follow-up Information     Hilarie Lovely, MD Follow up on 11/26/2023.   Specialty: Cardiothoracic Surgery Why: Appointment is VIRTUAL;please do NOT go to the office. Dr. Deloise Ferries will call you on 11/26/2023 at 2:00 pm Contact information: [address] [place] [address] [phone]         West, Katlyn D, NP. Go on 12/07/2023.   Specialty: Cardiology Why: Appointment time is at 10:55 am Contact information: [address] [place] [address] [phone]         [facility] at [facility] [place]. [facility] [employer]. Go on 11/23/2023.   Specialty: Cardiology Why: Appointment time is at 10:15 am Contact information: [address] [place]  [address] [phone]        [facility]  Follow up.   Why: ([facility]) Contact information: [address] [place] [address] [phone]                 The patient has been discharged on:   1.Beta Blocker:  Yes [ x  ]                              No   [   ]                              If No, reason:  2.Ace Inhibitor/ARB: Yes [  x ]  No  [    ]                                     If No, reason:  3.Statin:   Yes [  x ]                  No  [   ]                  If No, reason:  4.Ecasa:  Yes  [ x  ]                  No   [   ]                  If No, reason:  5. ACS on Admission?  No  P2Y12 Inhibitor:  Yes  [   ]                                No  [ x ]    Signed: Leata Providence PA-C 11/25/2023, 7:26 AM

## 2023-11-03 NOTE — Transfer of Care (Signed)
 Immediate Anesthesia Transfer of Care Note  Patient: Katelyn Cline  Procedure(s) Performed: EXPLORATION POST OPERATIVE OPEN HEART; WASHOUT (Chest) ECHOCARDIOGRAM, TRANSESOPHAGEAL  Patient Location: ICU  Anesthesia Type:General  Level of Consciousness: Patient remains intubated per anesthesia plan  Airway & Oxygen  Therapy: Patient remains intubated per anesthesia plan and Patient placed on Ventilator (see vital sign flow sheet for setting)  Post-op Assessment: Report given to RN and Post -op Vital signs reviewed and stable  Post vital signs: Reviewed and stable  Last Vitals:  Vitals Value Taken Time  BP 128/42 11/03/23 2148  Temp 37.4 C 11/03/23 2148  Pulse 75 11/03/23 2148  Resp 18 11/03/23 2148  SpO2 93 % 11/03/23 2148  Vitals shown include unfiled device data.  Last Pain:  Vitals:   11/03/23 1842  TempSrc: Core  PainSc:       Patients Stated Pain Goal: 5 (10/31/23 2258)  Complications: No notable events documented.

## 2023-11-03 NOTE — Op Note (Signed)
      [address]       [name] [address]             [phone]                                          11/03/2023 Patient:  Katelyn Cline Pre-Op Dx: s/p AVR CABG Excess chest tube output Hypovolemic shock   Post-op Dx:  same Procedure: Mediastinal re-exploration    Surgeon and Role:      * Sora Olivo, Marinell Siad, MD - Primary  Anesthesia  general EBL:  Blood Administration: see anesthesia record   Drains: 80 F blake drain: L, mediastinal X 2  Counts: correct   Indications: 63yo female s/p AVR, CABG, atriclip.  She has had ongoing chest tube output, and increasing pressor requirements.  She will be re-explored.  Findings: No obvious source of bleeding.  Left hemothorax was evacuated.  All sutures lines and grafts were hemostatic  Operative Technique: The patient was brought to the operative theatre.  Anesthesia was induced, and the patient was prepped and draped in normal sterile fashion.  An appropriate surgical pause was performed, and pre-operative antibiotics were dosed accordingly.  The previous sternotomy was re-opened, and the sternal retractor was then placed.  A left effusion was evacuated.  All suture lines, grafts, and cannulation sites were checked.  All were hemostatic.  No culprit source was identified.     Chest tubes were placed, and the sternum was re-approximated with with sternal wires.  The soft tissue and skin were re-approximated wth absorbable suture.    The patient tolerated the procedure without any immediate complications, and was transferred to the ICU in guarded condition.  Rockelle Heuerman [name]

## 2023-11-03 NOTE — Progress Notes (Signed)
     [address]       [place] [address]             [phone]       No events Vitals:   11/03/23 0657 11/03/23 0659  BP:  114/65  Pulse: (!) 51   Resp: 16   Temp: 97.6 F (36.4 C)   SpO2: 99%    Alert NAD Katelyn Cline  OR today for CABG, atriclip, AVR  Katelyn Cline O Katelyn Cline

## 2023-11-03 NOTE — Anesthesia Procedure Notes (Signed)
 Central Venous Catheter Insertion Performed by: Erin Havers, MD, anesthesiologist Start/End5/12/2023 7:45 AM, 11/03/2023 7:55 AM Patient location: Pre-op. Preanesthetic checklist: patient identified, IV checked, site marked, risks and benefits discussed, surgical consent, monitors and equipment checked, pre-op evaluation, timeout performed and anesthesia consent Position: Trendelenburg Lidocaine  1% used for infiltration and patient sedated Hand hygiene performed , maximum sterile barriers used  and Seldinger technique used Catheter size: 9 Fr Central line was placed.MAC introducer Procedure performed using ultrasound guided technique. Ultrasound Notes:anatomy identified, needle tip was noted to be adjacent to the nerve/plexus identified, no ultrasound evidence of intravascular and/or intraneural injection and image(s) printed for medical record Attempts: 1 Following insertion, line sutured, dressing applied and Biopatch. Post procedure assessment: free fluid flow, blood return through all ports and no air  Patient tolerated the procedure well with no immediate complications.

## 2023-11-03 NOTE — Progress Notes (Signed)
 Patient ID: Katelyn Cline, female   DOB: Jun 15, 1961, 63 y.o.   MRN: [id number]   TCTS Evening Rounds:   Hemodynamically unstable on escalating doses of NE to 34 mcg Epi 3 and vasopressin 0.04  CI = 2.61, PAD in the 30's Metabolic acidosis -9 Co-ox 43.7  Asleep on vent.  CXR ok  Has had increased chest tube output postop and received 2 units FFP, 2 units of blood. INR 1.5, plts 418, fib 222.  CBC    Component Value Date/Time   WBC 36.3 (H) 11/03/2023 1530   RBC 2.30 (L) 11/03/2023 1530   HGB 7.1 (L) 11/03/2023 1536   HGB 12.2 09/13/2023 1020   HCT 21.0 (L) 11/03/2023 1536   PLT 418 (H) 11/03/2023 1530   PLT 560 (H) 09/13/2023 1020   MCV 99.6 11/03/2023 1530   MCH 27.4 11/03/2023 1530   MCHC 27.5 (L) 11/03/2023 1530   RDW 22.6 (H) 11/03/2023 1530   LYMPHSABS 1.1 09/13/2023 1020   MONOABS 0.4 09/13/2023 1020   EOSABS 0.2 09/13/2023 1020   BASOSABS 0.2 (H) 09/13/2023 1020     BMET    Component Value Date/Time   NA 139 11/03/2023 1536   K 5.1 11/03/2023 1536   CL 104 11/03/2023 1417   CO2 32 11/03/2023 0441   GLUCOSE 123 (H) 11/03/2023 1417   BUN 29 (H) 11/03/2023 1417   CREATININE 0.70 11/03/2023 1417   CREATININE 0.79 09/13/2023 1020   CREATININE 0.71 06/16/2019 1200   CALCIUM  9.5 11/03/2023 0441   GFRNONAA >60 11/03/2023 0441   GFRNONAA >60 09/13/2023 1020   GFRNONAA 95 04/28/2018 0854     A/P:  Postop bleeding with shock. Dr. Deloise Ferries evaluating and will decide about need for exploration in OR.

## 2023-11-03 NOTE — Discharge Instructions (Signed)
 Discharge Instructions:  1. You may shower, please wash incisions daily with soap and water and keep dry.  If you wish to cover wounds with dressing you may do so but please keep clean and change daily.  No tub baths or swimming until incisions have completely healed.  If your incisions become red or develop any drainage please call our office at [phone]  2. No Driving until cleared by Dr. Lucilla Lame office and you are no longer using narcotic pain medications  3. Monitor your weight daily.. Please use the same scale and weigh at same time... If you gain 5-10 lbs in 48 hours with associated lower extremity swelling, please contact our office at [phone]  4. Fever of 101.5 for at least 24 hours with no source, please contact our office at [phone]  5. Activity- up as tolerated, please walk at least 3 times per day.  Avoid strenuous activity, no lifting, pushing, or pulling with your arms over 8-10 lbs for a minimum of 6 weeks  6. If any questions or concerns arise, please do not hesitate to contact our office at [phone]

## 2023-11-03 NOTE — Anesthesia Procedure Notes (Signed)
 Central Venous Catheter Insertion Performed by: Erin Havers, MD, anesthesiologist Start/End5/12/2023 7:55 AM, 11/03/2023 8:05 AM Patient location: Pre-op. Preanesthetic checklist: patient identified, IV checked, site marked, risks and benefits discussed, surgical consent, monitors and equipment checked, pre-op evaluation and timeout performed Position: Trendelenburg Hand hygiene performed  and maximum sterile barriers used  Total catheter length 100. PA cath was placed.Swan type:thermodilution PA Cath depth:50 Procedure performed without using ultrasound guided technique. Attempts: 1 Patient tolerated the procedure well with no immediate complications.

## 2023-11-03 NOTE — Anesthesia Preprocedure Evaluation (Addendum)
 Anesthesia Evaluation  Patient identified by MRN, date of birth, ID band Patient unresponsive    Reviewed: Unable to perform ROS - Chart review onlyPreop documentation limited or incomplete due to emergent nature of procedure.  Airway Mallampati: Intubated       Dental   Pulmonary Current Smoker and Patient abstained from smoking.    + decreased breath sounds      Cardiovascular hypertension,  Rhythm:Regular     Neuro/Psych    GI/Hepatic   Endo/Other  diabetes    Renal/GU      Musculoskeletal   Abdominal   Peds  Hematology   Anesthesia Other Findings   Reproductive/Obstetrics                             Anesthesia Physical Anesthesia Plan  ASA: 5 and emergent  Anesthesia Plan: General   Post-op Pain Management:    Induction: Intravenous  PONV Risk Score and Plan: 2 and Treatment may vary due to age or medical condition  Airway Management Planned: Oral ETT  Additional Equipment: Arterial line, CVP, PA Cath and TEE  Intra-op Plan:   Post-operative Plan: Post-operative intubation/ventilation  Informed Consent:      History available from chart only and Only emergency history available  Plan Discussed with: CRNA and Surgeon  Anesthesia Plan Comments:        Anesthesia Quick Evaluation

## 2023-11-03 NOTE — Op Note (Signed)
 [address]       [name] [address]             [phone]                                          11/03/2023 Patient:  Katelyn Cline Pre-Op Dx: 3V CAD Moderate aortic stenosis History of atrial fibrillation COPD on home oxygent   Post-op Dx:  same Procedure: CABG X 3.  LIMA LAD, RSVG PDA, OM1   Aortic valve replacement with a 23mm Inspiris valve Placement of 45mm Atriclip  Endoscopic greater saphenous vein harvest on the right   Surgeon and Role:      * Cortni Tays, Marinell Siad, MD - Primary    * M. Marlys Singh - assisting An experienced assistant was required given the complexity of this surgery and the standard of surgical care. The assistant was needed for exposure, dissection, suctioning, retraction of delicate tissues and sutures, instrument exchange and for overall help during this procedure.    Anesthesia  general EBL:  Blood Administration: none Xclamp Time:  150 min Pump Time:   Drains: 19 F blake drain: L, mediastinal X2 Wires:  V Counts: correct   Indications: 63yo female admitted with anginal symptoms in the setting of anemia. She has a history of 3V CAD, and moderate aortic stenosis. She has been on eliquis  for paroxysmal atrial fibrillation. Will plan for AVR/CABG, and atriclip placement   Findings: Good LIMA and vein.  Good LAD.  Intramyocardial OM, small PDA  Operative Technique: All invasive lines were placed in pre-op holding.  After the risks, benefits and alternatives were thoroughly discussed, the patient was brought to the operative theatre.  Anesthesia was induced, and the patient was prepped and draped in normal sterile fashion.  An appropriate surgical pause was performed, and pre-operative antibiotics were dosed accordingly.  We began with simultaneous incisions along the right leg for harvesting of the greater saphenous vein and the chest for the sternotomy.  In regards to the sternotomy, this was  carried down with bovie cautery, and the sternum was divided with a reciprocating saw.  Meticulous hemostasis was obtained.  The left internal thoracic artery was exposed and harvested in in pedicled fashion.  The patient was systemically heparinized, and the artery was divided distally, and placed in a papaverine sponge.    The sternal elevator was removed, and a retractor was placed.  The pericardium was divided in the midline and fashioned into a cradle with pericardial stitches.   After we confirmed an appropriate ACT, the ascending aorta was cannulated in standard fashion.  The right atrial appendage was used for venous cannulation site.  Cardiopulmonary bypass was initiated, and the heart retractor was placed. The cross clamp was applied, and a dose of anterograde cardioplegia was given with good arrest of the heart.  We moved to the posterior wall of the heart, and found a good target on the PDA.  An arteriotomy was made, and the vein graft was anastomosed to it in an end to side fashion.  Next we exposed the lateral wall, and found a good target on the OM.  An end to side anastomosis with the vein graft was then created.  The left atrial appendage was sized to a 45mm atriclip.  The was placed along the base.  We exposed  a good target on the  LAD, and fashioned an end to side anastomosis between it and the LITA.    Another dose of anterograde cardioplegia was given with good arrest of the heart.  Our aortotomy was made and directed toward the non coronary cusp.  The valve was inspected.  It was trileaflet and moderately calcified.  All leaflets were excised.  The annulus was sized to a 23mm Inspiris valve.  The left ventricle was then copiously irrigated.  Pledgeted mattress sutures were placed circumferentially through the annulus.  These sutures were then passed through the sewing ring of the valve.  Once the valve was seated in the annulus, it was secured with Core-knot sutures.  We began to rewarm,  and close our aortotomy in 2 layers.  We created an end to side anastomosis between it and the proximal vein grafts.  Rings were placed on the proximal anastomosis.  A re-animation dose of cardioplegia was given.  After de-airing the heart, the aortic cross clamp was removed.  We checked our valve function, and for air using the TEE.    Hemostasis was obtained, and we separated from cardiopulmonary bypass without event.  The heparin  was reversed with protamine.  Chest tubes and wires were placed, and the sternum was re-approximated with sternal wires.  The soft tissue and skin were re-approximated wth absorbable suture.    The patient tolerated the procedure without any immediate complications, and was transferred to the ICU in guarded condition.  Mrk Buzby [name]

## 2023-11-03 NOTE — Hospital Course (Addendum)
 Primary Care: Christel Cousins, MD Primary Cardiologist:Jonathan Katheryne Pane, MD    History of Present Illness:   Katelyn Cline is a 63 year old female with a past history of hypertension, dyslipidemia, type 2 diabetes mellitus, class I obesity, 100-pack-year smoking history, chronic pain syndrome with fibromyalgia and degenerative disc disease, history of atrial fibrillation status post cardioversion, and history of metastatic melanoma.  She also has history of an 11 mm posterior left lower lobe pulmonary nodule recently evaluated with a PET scan and shown to have low metabolic activity.  She has a known history of COPD and is on supplemental oxygen  24 hours a day. Katelyn Cline has been undergoing cardiac evaluation over the past several months for initial complaints of shortness of breath apparently starting back in September 2024.  She had a calcium  scoring coronary CT in January of this year revealing an elevated calcium  score of 1200% distribution of significant calcification in all 3 coronary systems.  An echocardiogram showed normal LV systolic function with mild concentric left ventricular hypertrophy and moderate aortic stenosis with an aortic valve area of 1.0 cm.  This found to have left heart catheterization on 10/01/2023 demonstrating severe multivessel coronary artery disease.  Katelyn Cline was referred to Dr. Deloise Ferries for outpatient evaluation of her coronary disease and moderate aortic stenosis.  She was scheduled to see him in the office next week but presented to the emergency room late yesterday after experiencing 4/10 squeezing chest discomfort radiating to her left arm with more intense chest pain.  EKG in the emergency room showed diffuse ST depressions in leads II , III, aVF, and V4 through V6.  High-sensitivity troponin was mildly elevated at 34 with subsequent assay about 2 hours later measured at 45.  Katelyn Cline was treated with aspirin  and started on a heparin  although the the  heparin  is now on hold due to anemia and report of recent dark tarry stools.  Katelyn Cline is being admitted to the hospital for further evaluation. CT surgery has been asked to initiate evaluation for potential coronary bypass grafting and aortic valve replacement during this admission.   Katelyn Cline is currently sitting up in bed eating lunch in the emergency room.  She denies any chest pain currently.  She is unaware of any further atrial fibrillation since her cardioversion a few months ago.  She is status post bronchoscopy with right video-assisted thoracoscopy for wedge resection of right upper lobe mass for metastatic melanoma by Dr. Nicanor Barge in 2014.    Hospital Course: Katelyn Cline remained stable following admission to the hospital.  She had no further chest pain and no evidence of acute GI bleeding.  Her hemoglobin and hematocrit improved after admission without transfusion.  Coronary bypass grafting and aortic valve replacement were discussed with her by Dr. Deloise Ferries and she decided to proceed with surgery.  She was taken to the operating room electively on 11/03/2023 where CABG x 3 was accomplished along with aortic valve replacement utilizing a 23 mm Edwards Inspiris Resilia bioprosthetic valve.  Following procedure, she separated from cardiopulmonary bypass without difficulty.  She was transferred to the ICU in stable condition.  Over the next several hours, she required escalating doses of vasopressor support and had significant bloody drainage from the surgical drains.  She was returned to the operating room during the evening of  11/03/2023 for exploration.  No obvious source of bleeding was found.  The left hemothorax was evacuated.  All of the grafts and suture lines were found  to be hemostatic.  She was returned to the surgical ICU.  Blood pressure was supported with epinephrine  and vasopressin .  The chest tube drainage diminished to minimal over the next several hours.  The critical care  and the advanced heart failure teams assisted with management while in the ICU.  She remained intubated.  On the second postoperative day, she was placed on CPAP and all sedation was withheld.  She moves all extremities spontaneously and follows commands but she was not felt to be awake and alert enough safe extubation.  Ventilator support continued and she was started on empiric antibiotics.  The pulmonary critical care medicine team felt ready for extubation to BiPAP on postop day 4.  She was diuresed aggressively.  Empiric Maxipime  was continued while awaiting respiratory cultures.  Chest tubes were removed on postop day 4. She was felt stable for transfer from the ICU to 4E for further convalescence. She continued to maintain sinus rhythm on Lopressor  25 mg bid and Hydralazine  10 mg tid. As discussed with Dr. Deloise Ferries, she was restarted on Apixaban  on 05/13. She had hypoglycemia the am of 05/13 so scheduled Insulin  was decreased. She will be restarted on Metformin  at discharge.  She has been tolerating a diet and has had a bowel movement. Sternal and RLE wounds are clean, dry, healing without signs of infection. She is on 3 liters of oxygen , which she was on oxygen   prior to admission. PA/LAT CXR done 05/13 showed a moderate left pleural effusion. Apixaban  was stopped 05/14. IR did a left thoracentesis and removed 1 liter of bloody fluid. Of note, she has had leukocytosis and thrombocytosis. WBC increased to 27,500 and 958,000. I discussed with heme/oncology. This is likely a stress response. Per recommendation, I ordered an iron  study and ferritin. Results showed all within normal except iron  slightly low at 25. She does not appear to need IV Venofer  at this time. She was on Hydrea  prior to surgery and will discuss when to resume this. Hydralazine  was stopped and she was restarted on low dose Lisinopril  for BP control.  She was restarted on Apixaban . She had more episode of hypoglycemia with scheduled Insulin   so this was stopped. She will be restarted on Metformin  once glucose allows. She did have confusion on 05/14. She had no focal neuro deficit and was not hypoglycemic at that time. Oxy stopped and UA was checked as she has a history of UTIs;UA showed no sign of infection. WBC and platelets gradually decreased. Regarding her diabetes management, SS was changed and scheduled Insulin  was stopped due to rather persistent hypoglycemia. She will be started on Metformin  at discharge.  Cardiology continued to diurese her with IV Lasix  (80 mg bid then 40 mg IV bid) with excellent diuresis. PA/LAT CXR done 05/16 showed small left pleural effusion and atelectasis. WBC decreased to 17,400 and platelets were 1,221,000 (history of polycythemia vera). I did discuss with Dr. Salomon Cree via secure chat on 05/19 and he recommended Hydrea  500 mg every other day in addition to current dose of Hydrea . Insurance denied CIR so an appeal has been requested and we are awaiting decision.  She is felt stable for discharge to **.

## 2023-11-03 NOTE — Consult Note (Addendum)
 NAME:  Katelyn Cline, MRN:  [id number], DOB:  Apr 22, 1961, LOS: 9 ADMISSION DATE:  10/25/2023, CONSULTATION DATE:  5/7 REFERRING MD:  Dr. Deloise Ferries, CHIEF COMPLAINT:  CABG x 3; AVR; atriclip  History of Present Illness:  Patient is a 63 yo F w/ pertinent PMH of 3v CAD, moderate aortic stenosis, Afib on apixaban , COPD on 4L [place], 100 pack year history, DMT2, HTN, HLD, PAD presents to [facility] ED on 4/28 w/ unstable angina.  Patient followed by cardiology outpt. Coronary calcium  score on 06/2023 1238 in all 3 coronary arteries. Echo w/ normal systolic function and moderate aortic stenosis. 08/2023 cardaic PET was high risk. Cardiac cath showing 3 vessel disease. Plan to see TCTS for potential CABG/AVR.  On 4/28 having chest pain radiating down to left arm and jaw. Came to [facility] ed. Cards admitted for unstable angina. Started on heparin  iv and asa. Given sublingual nitroglycerin . Trop mildly elevated. Also found to have anemia and fecal occult positive. Started on PPI. GI consulted recommending continuing PPI BID and hold on EGD for now.   Pertinent  Medical History   Past Medical History:  Diagnosis Date   Agoraphobia    Anxiety    Arthritis    "all joints"   Chronic low back pain    Chronic respiratory failure with hypoxia ([name])    4L  via [place],  followed by pcp,   (09-16-2018  per pt only uses while at home and at night ,  portable oxygen  is not working)   Clotting disorder ([name]) 2013   Polycythemia vera   Colon polyps    COPD (chronic obstructive pulmonary disease) ([name])    DDD (degenerative disc disease), lumbosacral    Dental caries    DM type 2 (diabetes mellitus, type 2) ([name])    followed by pcp   Fibromyalgia    GERD (gastroesophageal reflux disease)    occasional , no meds   Heart murmur 2020   History of basal cell carcinoma (BCC) excision    s/p  Moh's of face/ nose 12/ 2013   History of palpitations    per pt found to have occaional PVCs   History of UTI    HTN (hypertension)     Hyperlipidemia    Loose, teeth    Major depression    Metastatic melanoma ([name]) 10/18/2012   12 mm posterior right upper lobe pulmonary nodule, max SUV 3.0  s/p  right wedge resection 10-21-2012,  followed by oncologist-- dr Salomon Cree,  no recurrence   Mixed stress and urge urinary incontinence    Myeloproliferative neoplasm ([name])    Narcolepsy    Obesity    OSA (obstructive sleep apnea)    Oxygen  deficiency    Oxygen  dependent    4 L via [place],  per pt portable oxygen  not working but does use while at home and at night   Panic attacks    Periodontitis    Peripheral neuropathy    feet   Polycythemia vera(238.4) hematology/ oncology-- dr Salomon Cree ([facility])   first dx 02014 due to smoker/copd--  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)  hx phlebotomies   Pulmonary nodules    bilateral small stable per CT 03-11-2018   Scoliosis    Sleep apnea    Smokers' cough ([name])    per pt productive a little in the morning's   Symptoms of upper respiratory infection (URI) 03/12/2018   Urinary (tract) obstruction    Wears glasses  Significant Hospital Events: Including procedures, antibiotic start and stop dates in addition to other pertinent events   4/28 admitted w/ unstable angina; possible GIB 5/7 CABG x 3 w/ AVR  Interim History / Subjective:  See above  Objective   Blood pressure (!) 134/52, pulse (!) 51, temperature 97.6 F (36.4 C), temperature source Oral, resp. rate 15, height 5' 8.5" (1.74 m), weight 100.2 kg, SpO2 96%. PAP: (67-74)/(40-50) 67/40      Intake/Output Summary (Last 24 hours) at 11/03/2023 1132 Last data filed at 11/03/2023 1018 Gross per 24 hour  Intake 340 ml  Output 200 ml  Net 140 ml   Filed Weights   10/25/23 0032 11/02/23 2232  Weight: 93.9 kg 100.2 kg    Examination: General: critically ill appearing on mech vent HEENT: MM pink/moist; ETT in place Neuro: sedate CV: s1s2, paced rate; CT in place PULM:  dim wheezing BS  bilaterally; on mech vent simv GI: soft, bsx4 active  Extremities: warm/dry, no edema  Skin: no rashes or lesions   Resolved Hospital Problem list     Assessment & Plan:  Postoperative vent management Plan: - CXR to confirm ETT and CT position - Continue full vent support (4-8cc/kg IBW) - Wean FiO2 for O2 sat > 90% - Daily WUA/SBT, rapid wean with SIMV per protocol - VAP bundle - Pulmonary hygiene - F/u ABG - PAD protocol for sedation: Precedex for goal RASS 0 to -1  S/p 3-vessel CABG and AVR, Atri clip, MAZE Moderate aortic stenosis CAD HTN HLD Plan: - cont levo, neo, vaso for map >65 - increase bloody outpt in CT and increasing pressor requiriements; transfuse 2 units PRBC and FFP - albumin per protocol - Continue ASA and statin - wean insulin  gtt per protocol - Postoperative care per TCTS - pain management - CT management per protocol - holding heparin  in setting of GIB - levaquin /vanc for surgical ppx  COPD Tobacco abuse -on 3L [place] at home Plan: -duoneb prn; start on yupelri/brovana -cessation counseling when appropriate -consider nicotine  patch   Polycythemia Vera Anemia w/ GIB Plan: -GI consulted; recommend PPI BID and hold on EGD for now -trend cbc -hold hydroxyurea  -heparin  on hold  DMT2 Plan: -insuling gtt per protocol post op -cbg monitoring  Chronic Pain Plan: -pain management post op   PAD with Claudication Plan: -f/u outpt  -asa and statin  Depression Plan: -continue bupropion  when able to take po   Peripheral Neuropathy Plan: -duloxetine  when able to take po   Best Practice (right click and "Reselect all SmartList Selections" daily)   Diet/type: NPO w/ meds via tube DVT prophylaxis: SCD GI prophylaxis: PPI Lines: Central line and Arterial Line Foley:  Yes, and it is still needed Code Status:  full code Last date of multidisciplinary goals of care discussion [per primary]  Labs   CBC: Recent Labs  Lab 10/29/23 1222  10/30/23 0441 10/31/23 0442 11/01/23 0412 11/03/23 0441 11/03/23 0905 11/03/23 0908 11/03/23 1039 11/03/23 1102 11/03/23 1107  WBC 13.9* 13.9* 13.9* 12.2* 13.5*  --   --   --   --   --   HGB 9.5* 8.3* 8.7* 9.1* 9.5* 9.9* 10.5* 9.2* 7.5* 7.8*  HCT 33.5* 29.1* 29.9* 32.1* 33.9* 29.0* 31.0* 27.0* 22.0* 23.0*  MCV 90.3 91.5 92.3 94.1 94.4  --   --   --   --   --   PLT 494* 428* 432* 433* 463*  --   --   --   --   --  Basic Metabolic Panel: Recent Labs  Lab 10/29/23 1222 10/30/23 0441 10/31/23 0442 11/01/23 0412 11/03/23 0441 11/03/23 0905 11/03/23 0908 11/03/23 1039 11/03/23 1102 11/03/23 1107  NA 141 139 139 139 141 141 141 139 140 153*  K 4.5 4.1 4.2 4.2 4.3 4.1 4.1 4.2 4.3 3.5  CL 106 105 103 103 102 101  --  102  --   --   CO2 25 28 28 28  32  --   --   --   --   --   GLUCOSE 120* 159* 155* 129* 135* 122*  --  110*  --   --   BUN 19 23 24* 24* 27* 26*  --  25*  --   --   CREATININE 0.74 0.78 0.89 0.81 0.76 0.70  --  0.70  --   --   CALCIUM  9.5 8.6* 9.0 9.1 9.5  --   --   --   --   --    GFR: Estimated Creatinine Clearance: 87.5 mL/min (by C-G formula based on SCr of 0.7 mg/dL). Recent Labs  Lab 10/30/23 0441 10/31/23 0442 11/01/23 0412 11/03/23 0441  WBC 13.9* 13.9* 12.2* 13.5*    Liver Function Tests: Recent Labs  Lab 10/28/23 0359  AST 14*  ALT 16  ALKPHOS 62  BILITOT 0.6  PROT 6.0*  ALBUMIN 3.1*   No results for input(s): "LIPASE", "AMYLASE" in the last 168 hours. No results for input(s): "AMMONIA" in the last 168 hours.  ABG    Component Value Date/Time   PHART 7.390 11/03/2023 1102   PCO2ART 48.3 (H) 11/03/2023 1102   PO2ART 345 (H) 11/03/2023 1102   HCO3 43.5 (H) 11/03/2023 1107   TCO2 45 (H) 11/03/2023 1107   ACIDBASEDEF 1.0 10/01/2023 1327   O2SAT 62 11/03/2023 1107     Coagulation Profile: Recent Labs  Lab 10/28/23 0359  INR 1.1    Cardiac Enzymes: No results for input(s): "CKTOTAL", "CKMB", "CKMBINDEX", "TROPONINI" in the  last 168 hours.  HbA1C: Hgb A1c MFr Bld  Date/Time Value Ref Range Status  10/28/2023 03:59 AM 5.1 4.8 - 5.6 % Final    Comment:    (NOTE) Pre diabetes:          5.7%-6.4%  Diabetes:              >6.4%  Glycemic control for   <7.0% adults with diabetes   11/17/2022 10:30 AM 5.8 4.6 - 6.5 % Final    Comment:    Glycemic Control Guidelines for People with Diabetes:Non Diabetic:  <6%Goal of Therapy: <7%Additional Action Suggested:  >8%     CBG: Recent Labs  Lab 11/02/23 0623 11/02/23 1237 11/02/23 1622 11/02/23 2109 11/03/23 0609  GLUCAP 136* 158* 182* 170* 146*    Review of Systems:   Patient is intubated; therefore, history has been obtained from chart review.    Past Medical History:  She,  has a past medical history of Agoraphobia, Anxiety, Arthritis, Chronic low back pain, Chronic respiratory failure with hypoxia ([name]), Clotting disorder ([name]) (2013), Colon polyps, COPD (chronic obstructive pulmonary disease) ([name]), DDD (degenerative disc disease), lumbosacral, Dental caries, DM type 2 (diabetes mellitus, type 2) ([name]), Fibromyalgia, GERD (gastroesophageal reflux disease), Heart murmur (2020), History of basal cell carcinoma (BCC) excision, History of palpitations, History of UTI, HTN (hypertension), Hyperlipidemia, Loose, teeth, Major depression, Metastatic melanoma ([name]) (10/18/2012), Mixed stress and urge urinary incontinence, Myeloproliferative neoplasm ([name]), Narcolepsy, Obesity, OSA (obstructive sleep apnea), Oxygen  deficiency, Oxygen  dependent, Panic attacks, Periodontitis,  Peripheral neuropathy, Polycythemia vera(238.4) (hematology/ oncology-- dr Salomon Cree ([facility])), Pulmonary nodules, Scoliosis, Sleep apnea, Smokers' cough ([name]), Symptoms of upper respiratory infection (URI) (03/12/2018), Urinary (tract) obstruction, and Wears glasses.   Surgical History:   Past Surgical History:  Procedure Laterality Date   ABDOMINAL HYSTERECTOMY  1998   partial   BREAST  LUMPECTOMY Bilateral 1995   benign per pt   CARDIOVERSION N/A 09/06/2023   Procedure: CARDIOVERSION;  Surgeon: Luana Rumple, MD;  Location: [facility] INVASIVE CV LAB;  Service: Cardiovascular;  Laterality: N/A;   COLONOSCOPY W/ BIOPSIES AND POLYPECTOMY     Hx: of   CYSTECTOMY  2000   abdominal wall    DILATION AND CURETTAGE OF UTERUS  yrs ago   MOHS SURGERY  2013   nose/face   MULTIPLE EXTRACTIONS WITH ALVEOLOPLASTY N/A 09/19/2018   Procedure: Extraction of tooth #'s 2, 3, 6-14, 18, and 20-30 with alveoloplasty;  Surgeon: Sianni Chroman, DDS;  Location: [facility] ORS;  Service: Oral Surgery;  Laterality: N/A;  GENERAL WITH NASAL TUBE   RIGHT/LEFT HEART CATH AND CORONARY ANGIOGRAPHY N/A 10/01/2023   Procedure: RIGHT/LEFT HEART CATH AND CORONARY ANGIOGRAPHY;  Surgeon: Arleen Lacer, MD;  Location: [facility] INVASIVE CV LAB;  Service: Cardiovascular;  Laterality: N/A;   VIDEO ASSISTED THORACOSCOPY (VATS)/WEDGE RESECTION Right 10/21/2012   Procedure: VIDEO ASSISTED THORACOSCOPY (VATS)/WEDGE RESECTION;  Surgeon: Norita Beauvais, MD;  Location: [facility] OR;  Service: Thoracic;  Laterality: Right;   VIDEO BRONCHOSCOPY N/A 10/21/2012   Procedure: VIDEO BRONCHOSCOPY;  Surgeon: Norita Beauvais, MD;  Location: [facility] OR;  Service: Thoracic;  Laterality: N/A;     Social History:   reports that she has been smoking cigarettes. She has a 25 pack-year smoking history. She has never used smokeless tobacco. She reports current alcohol  use of about 1.0 standard drink of alcohol  per week. She reports that she does not use drugs.   Family History:  Her family history includes ADD / ADHD in her son; Alcohol  abuse in her maternal grandfather; Anxiety disorder in her mother; Arthritis in her mother; Dementia in her father and mother; Depression in her mother; Heart disease in her brother and father; Hyperlipidemia in her father and mother; Hypertension in her father; Stroke in her father. There is no history of Bipolar disorder, Drug  abuse, OCD, Paranoid behavior, Schizophrenia, Seizures, Sexual abuse, or Physical abuse.   Allergies Allergies  Allergen Reactions   Contrast Media [Iodinated Contrast Media] Anaphylaxis    CAT Scan Conrast    Erythromycin     Stomach pain   Flagyl [Metronidazole] Other (See Comments)    Generalized pain, caused sickness   Other     Iodine Dye   Penicillins Other (See Comments)    Patient was an infant, no idea of reaction. Tolerates Keflex . Did it involve swelling of the face/tongue/throat, SOB, or low BP? Unknown Did it involve sudden or severe rash/hives, skin peeling, or any reaction on the inside of your mouth or nose? Unknown Did you need to seek medical attention at a hospital or doctor's office? Unknown When did it last happen?      infant If all above answers are "NO", may proceed with cephalosporin use. CHILDHOOD ALLERGY    Tetracyclines & Related Other (See Comments)    GI side effects     Home Medications  Prior to Admission medications   Medication Sig Start Date End Date Taking? Authorizing Provider  albuterol  (VENTOLIN  HFA) 108 (90 Base) MCG/ACT inhaler Inhale 2 puffs  into the lungs every 6 (six) hours as needed for wheezing or shortness of breath (as needed). 06/18/23  Yes Christel Cousins, MD  aspirin  EC 81 MG tablet Take 1 tablet (81 mg total) by mouth daily. Swallow whole. 11/17/22  Yes Christel Cousins, MD  atorvastatin  (LIPITOR) 80 MG tablet Take 1 tablet by mouth daily at bedtime. 10/06/23  Yes Christel Cousins, MD  buPROPion  ER (WELLBUTRIN  SR) 100 MG 12 hr tablet Take 1 tablet by mouth every morning and take 1 tablet by mouth every day at bedtime. 10/06/23  Yes Christel Cousins, MD  diltiazem  (CARDIZEM  CD) 120 MG 24 hr capsule Take 1 capsule (120 mg total) by mouth daily. Patient taking differently: Take 120 mg by mouth at bedtime. 09/07/23  Yes Christel Cousins, MD  DULoxetine  (CYMBALTA ) 60 MG capsule Take 1 capsule by mouth twice daily. 08/07/23  Yes Christel Cousins, MD  ELIQUIS  5 MG TABS tablet Take 1 tablet (5 mg total) by mouth 2 (two) times daily. 09/07/23  Yes Christel Cousins, MD  ezetimibe  (ZETIA ) 10 MG tablet Take 1 tablet by mouth every morning. 10/06/23  Yes Christel Cousins, MD  fenofibrate  (TRICOR ) 145 MG tablet Take 1 tablet by mouth daily at bedtime. 10/06/23  Yes Christel Cousins, MD  fish oil-omega-3 fatty acids  1000 MG capsule Take 2 capsules (2 g total) by mouth 2 (two) times daily. 11/17/22  Yes Christel Cousins, MD  furosemide  (LASIX ) 20 MG tablet Take 1 tablet (20 mg total) by mouth daily as needed. 04/07/23  Yes Christel Cousins, MD  hydrochlorothiazide  (HYDRODIURIL ) 25 MG tablet Take 1 tablet by mouth every morning. 10/06/23  Yes Christel Cousins, MD  hydroxyurea  (HYDREA ) 500 MG capsule Take 2 capsules by mouth daily at bedtime with an additional 1 capsule on Monday and Thursday. Patient taking differently: Take 1,000-1,500 mg by mouth See admin instructions. Take 2 capsules at daily at bedtime with 1 additional capsule on Mon, Wed, Fri nigtht 10/06/23  Yes Frankie Israel, MD  isosorbide  mononitrate (IMDUR ) 30 MG 24 hr tablet Take 1 tablet (30 mg total) by mouth at bedtime. 10/02/23 11/01/23 Yes Arleen Lacer, MD  lisinopril  (ZESTRIL ) 5 MG tablet Take 1 tablet by mouth daily at bedtime. 10/06/23  Yes Christel Cousins, MD  metFORMIN  (GLUCOPHAGE ) 1000 MG tablet Take 1 tablet by mouth every morning and take 1 tablet by mouth every day at bedtime. 10/06/23  Yes Christel Cousins, MD  nitroGLYCERIN  (NITROSTAT ) 0.4 MG SL tablet Place 1 tablet (0.4 mg total) under the tongue every 5 (five) minutes as needed for chest pain (up to 3 doses. If taking 3rd dose call 911). 10/02/23  Yes Arleen Lacer, MD  nystatin  (MYCOSTATIN /NYSTOP ) powder Apply 1 Application topically 3 (three) times daily. PA Case: [id number] Approved 06/30/2019- 06/28/2020 Patient taking differently: Apply 1 Application topically 3 (three) times daily as needed (irritation). PA Case:  [id number] Approved 06/30/2019- 06/28/2020 05/18/22  Yes Christel Cousins, MD  nystatin  cream (MYCOSTATIN ) Apply topically twice daily. Patient taking differently: Apply 1 Application topically 2 (two) times daily as needed for dry skin. 07/11/23  Yes Christel Cousins, MD  oxyCODONE -acetaminophen  (PERCOCET) 10-325 MG tablet Take 1 tablet by mouth every 4 (four) hours as needed for severe pain (pain score 7-10). 07/18/18  Yes [place], Marvell Slider, MD  potassium chloride  SA (KLOR-CON  M) 20 MEQ tablet Take 1 tablet (20 mEq total) by mouth daily as needed. Take  if take furosemide  04/07/23  Yes Christel Cousins, MD  senna (SENOKOT) 8.6 MG TABS tablet Take 2 tablets by mouth daily as needed for mild constipation.   Yes [provider]  Vitamin D , Ergocalciferol , (DRISDOL ) 1.25 MG (50000 UNIT) CAPS capsule Take 1 capsule by mouth on Sundays at bedtime. 09/07/23  Yes Christel Cousins, MD  Blood Glucose Monitoring Suppl (BLOOD GLUCOSE SYSTEM PAK) KIT Please dispense based on patient and insurance preference. Use as directed to monitor FSBS 2x daily. Dx: E11.9 10/18/19   Mathis Som, MD  glucose blood (ACCU-CHEK GUIDE) test strip Use as instructed 04/27/22   Christel Cousins, MD  Glucose Blood (BLOOD GLUCOSE TEST STRIPS) STRP Please dispense based on patient and insurance preference. Use as directed to monitor FSBS 2x daily. Dx: E11.9 10/18/19   Mathis Som, MD  Microlet Lancets MISC Please dispense based on patient and insurance preference. Use as directed to monitor FSBS 2x daily. Dx: E11.9 10/18/19   Mathis Som, MD     Critical care time: 45 minutes    JD Carliss Chess [facility] 11/03/2023, 11:32 AM  Please see [email or web address] for pager details.  From 7A-7P if no response, please call [phone]. After hours, please call ELink [phone].

## 2023-11-03 NOTE — Anesthesia Postprocedure Evaluation (Signed)
 Anesthesia Post Note  Patient: Katelyn Cline  Procedure(s) Performed: CORONARY ARTERY BYPASS GRAFTING X 3, USING LEFT INTERNAL MAMMARY ARTERY AND RIGHT ENDOSCOPIC HARVESTED GREATER SAPHENOUS VEIN (Chest) REPLACEMENT, AORTIC VALVE, OPEN USING INSPIRIS RESILIA AORTIC VALVE (Chest) CLIPPING, LEFT ATRIAL APPENDAGE USING ATRICURE ATRICLIP ECHOCARDIOGRAM, TRANSESOPHAGEAL     Patient location during evaluation: SICU Anesthesia Type: General Level of consciousness: sedated Pain management: pain level controlled Vital Signs Assessment: post-procedure vital signs reviewed and stable Respiratory status: patient remains intubated per anesthesia plan Cardiovascular status: requiring levophed infusion. Postop Assessment: no apparent nausea or vomiting Anesthetic complications: no   No notable events documented.  Last Vitals:  Vitals:   11/03/23 1730 11/03/23 1731  BP:  (!) 96/58  Pulse:  90  Resp: (!) 26 13  Temp: (!) 36.3 C (!) 36.3 C  SpO2:      Last Pain:  Vitals:   11/03/23 1731  TempSrc: Core  PainSc:                  Erin Havers

## 2023-11-03 NOTE — Transfer of Care (Signed)
 Immediate Anesthesia Transfer of Care Note  Patient: Katelyn Cline  Procedure(s) Performed: CORONARY ARTERY BYPASS GRAFTING X 3, USING LEFT INTERNAL MAMMARY ARTERY AND RIGHT ENDOSCOPIC HARVESTED GREATER SAPHENOUS VEIN (Chest) REPLACEMENT, AORTIC VALVE, OPEN USING INSPIRIS RESILIA AORTIC VALVE (Chest) CLIPPING, LEFT ATRIAL APPENDAGE USING ATRICURE ATRICLIP ECHOCARDIOGRAM, TRANSESOPHAGEAL  Patient Location: ICU  Anesthesia Type:General  Level of Consciousness: sedated and Patient remains intubated per anesthesia plan  Airway & Oxygen  Therapy: Patient remains intubated per anesthesia plan and Patient placed on Ventilator (see vital sign flow sheet for setting)  Post-op Assessment: Report given to RN, Post -op Vital signs reviewed and stable, and Post -op Vital signs reviewed and unstable, Anesthesiologist notified  Post vital signs: Reviewed, stable, and unstable--upon admission to ICU, patient hypotensive requiring boluses of Phenylephrine  and Ephedrine . Rate of Levophed and Phenylephrine  gtts increased. RNs in ICU giving Albumin, Calcium , and Bicarb; starting Vaso gtt and drawing labs. Surgeon aware and at bedside, and also aware of chest tube output. MDA called to make aware of situation. Care resumed by ICU staff.  Last Vitals:  Vitals Value Taken Time  BP 93/43 11/03/23 1544  Temp 36.1 C 11/03/23 1544  Pulse 86 11/03/23 1544  Resp 20 11/03/23 1544  SpO2 93 % 11/03/23 1544  Vitals shown include unfiled device data.  Last Pain:  Vitals:   11/03/23 0657  TempSrc: Oral  PainSc: 7       Patients Stated Pain Goal: 5 (10/31/23 2258)  Complications: No notable events documented.

## 2023-11-03 NOTE — Brief Op Note (Signed)
 10/25/2023 - 11/03/2023  2:02 PM  PATIENT:  Katelyn Cline  63 y.o. female  PRE-OPERATIVE DIAGNOSIS:  CORONARY ARTERY DISEASE, AORTIC STENOSIS, ATRIAL FIBRILLATION  POST-OPERATIVE DIAGNOSIS:  CORONARY ARTERY DISEASE, AORTIC STENOSIS, ATRIAL FIBRILLATION  PROCEDURE:  CORONARY ARTERY BYPASS GRAFTING X , USING LEFT INTERNAL MAMMARY ARTERY AND RIGHT ENDOSCOPIC HARVESTED GREATER SAPHENOUS VEIN  LIMA-LAD SVG-PDA SVG-OM  REPLACEMENT, AORTIC VALVE, OPEN USING INSPIRIS RESILIA AORTIC VALVE  CLIPPING, LEFT ATRIAL APPENDAGE USING ATRICURE ATRICLIP   Vein harvest time: Vein prep time:   SURGEON: Light[place], Marinell Siad, MD   PHYSICIAN ASSISTANT: Marcea Rojek  ASSISTANTS: Maxie Spaniel, RN, Scrub Person         Hiawatha Lout, RN, RN First Assistant   ANESTHESIA:   general  EBL:   BLOOD ADMINISTERED:none  DRAINS:  Mediastinal and pleural tubes    LOCAL MEDICATIONS USED:  NONE  SPECIMEN:  Aortic valve leaflets  DISPOSITION OF SPECIMEN:  PATHOLOGY  COUNTS:  Correct  DICTATION: .Dragon Dictation  PLAN OF CARE: Admit to inpatient   PATIENT DISPOSITION:  ICU - intubated and hemodynamically stable.   Delay start of Pharmacological VTE agent (>24hrs) due to surgical blood loss or risk of bleeding: yes

## 2023-11-04 ENCOUNTER — Telehealth: Payer: Self-pay | Admitting: Cardiovascular Disease

## 2023-11-04 ENCOUNTER — Inpatient Hospital Stay (HOSPITAL_COMMUNITY)

## 2023-11-04 ENCOUNTER — Encounter (HOSPITAL_COMMUNITY): Payer: Self-pay | Admitting: Thoracic Surgery (Cardiothoracic Vascular Surgery)

## 2023-11-04 DIAGNOSIS — D45 Polycythemia vera: Secondary | ICD-10-CM | POA: Diagnosis not present

## 2023-11-04 DIAGNOSIS — I5033 Acute on chronic diastolic (congestive) heart failure: Secondary | ICD-10-CM

## 2023-11-04 DIAGNOSIS — Z720 Tobacco use: Secondary | ICD-10-CM | POA: Diagnosis not present

## 2023-11-04 DIAGNOSIS — I480 Paroxysmal atrial fibrillation: Secondary | ICD-10-CM | POA: Diagnosis not present

## 2023-11-04 DIAGNOSIS — D649 Anemia, unspecified: Secondary | ICD-10-CM | POA: Diagnosis not present

## 2023-11-04 DIAGNOSIS — J9601 Acute respiratory failure with hypoxia: Secondary | ICD-10-CM | POA: Diagnosis not present

## 2023-11-04 DIAGNOSIS — J449 Chronic obstructive pulmonary disease, unspecified: Secondary | ICD-10-CM | POA: Diagnosis not present

## 2023-11-04 DIAGNOSIS — I200 Unstable angina: Secondary | ICD-10-CM | POA: Diagnosis not present

## 2023-11-04 LAB — PREPARE FRESH FROZEN PLASMA
Unit division: 0
Unit division: 0
Unit division: 0

## 2023-11-04 LAB — CBC
HCT: 29 % — ABNORMAL LOW (ref 36.0–46.0)
HCT: 29.2 % — ABNORMAL LOW (ref 36.0–46.0)
Hemoglobin: 9.7 g/dL — ABNORMAL LOW (ref 12.0–15.0)
Hemoglobin: 9.4 g/dL — ABNORMAL LOW (ref 12.0–15.0)
MCH: 29.7 pg (ref 26.0–34.0)
MCH: 29.7 pg (ref 26.0–34.0)
MCHC: 33.4 g/dL (ref 30.0–36.0)
MCHC: 32.2 g/dL (ref 30.0–36.0)
MCV: 88.7 fL (ref 80.0–100.0)
MCV: 92.1 fL (ref 80.0–100.0)
Platelets: 199 10*3/uL (ref 150–400)
Platelets: 187 10*3/uL (ref 150–400)
RBC: 3.27 MIL/uL — ABNORMAL LOW (ref 3.87–5.11)
RBC: 3.17 MIL/uL — ABNORMAL LOW (ref 3.87–5.11)
RDW: 17.9 % — ABNORMAL HIGH (ref 11.5–15.5)
RDW: 18.9 % — ABNORMAL HIGH (ref 11.5–15.5)
WBC: 15.8 10*3/uL — ABNORMAL HIGH (ref 4.0–10.5)
WBC: 18.7 10*3/uL — ABNORMAL HIGH (ref 4.0–10.5)
nRBC: 1.9 % — ABNORMAL HIGH (ref 0.0–0.2)
nRBC: 0.6 % — ABNORMAL HIGH (ref 0.0–0.2)

## 2023-11-04 LAB — BASIC METABOLIC PANEL WITH GFR
Anion gap: 9 (ref 5–15)
Anion gap: 7 (ref 5–15)
BUN: 28 mg/dL — ABNORMAL HIGH (ref 8–23)
BUN: 36 mg/dL — ABNORMAL HIGH (ref 8–23)
CO2: 26 mmol/L (ref 22–32)
CO2: 31 mmol/L (ref 22–32)
Calcium: 9.3 mg/dL (ref 8.9–10.3)
Calcium: 9 mg/dL (ref 8.9–10.3)
Chloride: 107 mmol/L (ref 98–111)
Chloride: 107 mmol/L (ref 98–111)
Creatinine, Ser: 0.95 mg/dL (ref 0.44–1.00)
Creatinine, Ser: 1.03 mg/dL — ABNORMAL HIGH (ref 0.44–1.00)
GFR, Estimated: >60 mL/min (ref >60)
GFR, Estimated: >60 mL/min (ref >60)
Glucose, Bld: 135 mg/dL — ABNORMAL HIGH (ref 70–99)
Glucose, Bld: 119 mg/dL — ABNORMAL HIGH (ref 70–99)
Potassium: 3.7 mmol/L (ref 3.5–5.1)
Potassium: 3.8 mmol/L (ref 3.5–5.1)
Sodium: 142 mmol/L (ref 135–145)
Sodium: 145 mmol/L (ref 135–145)

## 2023-11-04 LAB — BPAM FFP
Blood Product Expiration Date: 202505072359
Blood Product Expiration Date: 202505102359
Blood Product Expiration Date: 202505102359
Blood Product Expiration Date: 202505072359
Blood Product Expiration Date: 202505092359
Blood Product Expiration Date: 202505092359
Blood Product Expiration Date: 202505122359
Blood Product Expiration Date: 202505122359
ISSUE DATE / TIME: 202505071554
ISSUE DATE / TIME: 202505071554
ISSUE DATE / TIME: 202505071833
ISSUE DATE / TIME: 202505071833
ISSUE DATE / TIME: 202505071905
ISSUE DATE / TIME: 202505081313
Unit Type and Rh: 6200
Unit Type and Rh: 6200
Unit Type and Rh: 6200
Unit Type and Rh: 6200
Unit Type and Rh: 6200
Unit Type and Rh: 6200
Unit Type and Rh: 6200
Unit Type and Rh: 6200

## 2023-11-04 LAB — COOXEMETRY PANEL
Carboxyhemoglobin: 0.7 % (ref 0.5–1.5)
Carboxyhemoglobin: 1.7 % — ABNORMAL HIGH (ref 0.5–1.5)
Methemoglobin: <0.7 % (ref 0.0–1.5)
Methemoglobin: <0.7 % (ref 0.0–1.5)
O2 Saturation: 50.6 %
O2 Saturation: 82.8 %
Total hemoglobin: 10.1 g/dL — ABNORMAL LOW (ref 12.0–16.0)
Total hemoglobin: 9.7 g/dL — ABNORMAL LOW (ref 12.0–16.0)

## 2023-11-04 LAB — POCT I-STAT 7, (LYTES, BLD GAS, ICA,H+H)
Acid-Base Excess: 3 mmol/L — ABNORMAL HIGH (ref 0.0–2.0)
Acid-Base Excess: 3 mmol/L — ABNORMAL HIGH (ref 0.0–2.0)
Acid-base deficit: 2 mmol/L (ref 0.0–2.0)
Bicarbonate: 24.8 mmol/L (ref 20.0–28.0)
Bicarbonate: 27.1 mmol/L (ref 20.0–28.0)
Bicarbonate: 28.4 mmol/L — ABNORMAL HIGH (ref 20.0–28.0)
Calcium, Ion: 1.35 mmol/L (ref 1.15–1.40)
Calcium, Ion: 1.24 mmol/L (ref 1.15–1.40)
Calcium, Ion: 1.35 mmol/L (ref 1.15–1.40)
HCT: 26 % — ABNORMAL LOW (ref 36.0–46.0)
HCT: 27 % — ABNORMAL LOW (ref 36.0–46.0)
HCT: 28 % — ABNORMAL LOW (ref 36.0–46.0)
Hemoglobin: 8.8 g/dL — ABNORMAL LOW (ref 12.0–15.0)
Hemoglobin: 9.2 g/dL — ABNORMAL LOW (ref 12.0–15.0)
Hemoglobin: 9.5 g/dL — ABNORMAL LOW (ref 12.0–15.0)
O2 Saturation: 98 %
O2 Saturation: 99 %
O2 Saturation: 96 %
Patient temperature: 37.2
Potassium: 3.8 mmol/L (ref 3.5–5.1)
Potassium: 3.8 mmol/L (ref 3.5–5.1)
Potassium: 4 mmol/L (ref 3.5–5.1)
Sodium: 144 mmol/L (ref 135–145)
Sodium: 143 mmol/L (ref 135–145)
Sodium: 143 mmol/L (ref 135–145)
TCO2: 26 mmol/L (ref 22–32)
TCO2: 28 mmol/L (ref 22–32)
TCO2: 30 mmol/L (ref 22–32)
pCO2 arterial: 54.1 mmHg — ABNORMAL HIGH (ref 32–48)
pCO2 arterial: 40.5 mmHg (ref 32–48)
pCO2 arterial: 49.5 mmHg — ABNORMAL HIGH (ref 32–48)
pH, Arterial: 7.269 — ABNORMAL LOW (ref 7.35–7.45)
pH, Arterial: 7.433 (ref 7.35–7.45)
pH, Arterial: 7.367 (ref 7.35–7.45)
pO2, Arterial: 125 mmHg — ABNORMAL HIGH (ref 83–108)
pO2, Arterial: 134 mmHg — ABNORMAL HIGH (ref 83–108)
pO2, Arterial: 89 mmHg (ref 83–108)

## 2023-11-04 LAB — GLUCOSE, CAPILLARY
Glucose-Capillary: 112 mg/dL — ABNORMAL HIGH (ref 70–99)
Glucose-Capillary: 117 mg/dL — ABNORMAL HIGH (ref 70–99)
Glucose-Capillary: 117 mg/dL — ABNORMAL HIGH (ref 70–99)
Glucose-Capillary: 127 mg/dL — ABNORMAL HIGH (ref 70–99)
Glucose-Capillary: 130 mg/dL — ABNORMAL HIGH (ref 70–99)
Glucose-Capillary: 144 mg/dL — ABNORMAL HIGH (ref 70–99)
Glucose-Capillary: 145 mg/dL — ABNORMAL HIGH (ref 70–99)
Glucose-Capillary: 147 mg/dL — ABNORMAL HIGH (ref 70–99)
Glucose-Capillary: 164 mg/dL — ABNORMAL HIGH (ref 70–99)
Glucose-Capillary: 174 mg/dL — ABNORMAL HIGH (ref 70–99)
Glucose-Capillary: 175 mg/dL — ABNORMAL HIGH (ref 70–99)
Glucose-Capillary: 197 mg/dL — ABNORMAL HIGH (ref 70–99)
Glucose-Capillary: 94 mg/dL (ref 70–99)

## 2023-11-04 LAB — MAGNESIUM
Magnesium: 2.2 mg/dL (ref 1.7–2.4)
Magnesium: 2.2 mg/dL (ref 1.7–2.4)

## 2023-11-04 LAB — SURGICAL PATHOLOGY

## 2023-11-04 MED ORDER — FUROSEMIDE 10 MG/ML IJ SOLN
40.0000 mg | Freq: Two times a day (BID) | INTRAMUSCULAR | Status: DC
  Administered 2023-11-04 – 2023-11-05 (×2): 40 mg via INTRAVENOUS
  Filled 2023-11-04 (×2): qty 4

## 2023-11-04 MED ORDER — POTASSIUM CHLORIDE 10 MEQ/50ML IV SOLN: 10.0000 meq | INTRAVENOUS | Status: DC

## 2023-11-04 MED ORDER — ORAL CARE MOUTH RINSE: 15.0000 mL | OROMUCOSAL | Status: DC | PRN

## 2023-11-04 MED ORDER — POTASSIUM CHLORIDE 10 MEQ/50ML IV SOLN
INTRAVENOUS | Status: AC
  Filled 2023-11-04: qty 150

## 2023-11-04 MED ORDER — DULOXETINE HCL 60 MG PO CPEP: 60.0000 mg | ORAL_CAPSULE | Freq: Two times a day (BID) | ORAL | Status: DC

## 2023-11-04 MED ORDER — FUROSEMIDE 10 MG/ML IJ SOLN
40.0000 mg | Freq: Once | INTRAMUSCULAR | Status: AC
  Administered 2023-11-04: 40 mg via INTRAVENOUS
  Filled 2023-11-04: qty 4

## 2023-11-04 MED ORDER — ORAL CARE MOUTH RINSE
15.0000 mL | OROMUCOSAL | Status: DC
  Administered 2023-11-04 – 2023-11-08 (×39): 15 mL via OROMUCOSAL

## 2023-11-04 MED ORDER — BUPROPION HCL ER (SR) 100 MG PO TB12
100.0000 mg | ORAL_TABLET | Freq: Two times a day (BID) | ORAL | Status: DC
  Administered 2023-11-05: 100 mg via ORAL
  Filled 2023-11-04: qty 1

## 2023-11-04 MED ORDER — POTASSIUM CHLORIDE 10 MEQ/50ML IV SOLN
10.0000 meq | INTRAVENOUS | Status: AC
  Administered 2023-11-04 (×3): 10 meq via INTRAVENOUS

## 2023-11-04 MED ORDER — NOREPINEPHRINE 16 MG/250ML-% IV SOLN: 0.0000 ug/min | INTRAVENOUS | Status: DC

## 2023-11-04 MED FILL — Calcium Chloride Inj 10%: INTRAVENOUS | Qty: 10 | Status: AC

## 2023-11-04 MED FILL — Electrolyte-R (PH 7.4) Solution: INTRAVENOUS | Qty: 3000 | Status: AC

## 2023-11-04 MED FILL — Heparin Sodium (Porcine) Inj 1000 Unit/ML: INTRAMUSCULAR | Qty: 30 | Status: AC

## 2023-11-04 MED FILL — Sodium Chloride IV Soln 0.9%: INTRAVENOUS | Qty: 2000 | Status: AC

## 2023-11-04 MED FILL — Heparin Sodium (Porcine) Inj 1000 Unit/ML: INTRAMUSCULAR | Qty: 10 | Status: AC

## 2023-11-04 MED FILL — Sodium Bicarbonate IV Soln 8.4%: INTRAVENOUS | Qty: 50 | Status: AC

## 2023-11-04 NOTE — Plan of Care (Signed)
  Problem: Education: Goal: Ability to describe self-care measures that may prevent or decrease complications (Diabetes Survival Skills Education) will improve Outcome: Progressing Goal: Individualized Educational Video(s) Outcome: Progressing   Problem: Coping: Goal: Ability to adjust to condition or change in health will improve Outcome: Progressing   Problem: Fluid Volume: Goal: Ability to maintain a balanced intake and output will improve Outcome: Progressing   Problem: Health Behavior/Discharge Planning: Goal: Ability to identify and utilize available resources and services will improve Outcome: Progressing Goal: Ability to manage health-related needs will improve Outcome: Progressing   Problem: Metabolic: Goal: Ability to maintain appropriate glucose levels will improve Outcome: Progressing   Problem: Nutritional: Goal: Maintenance of adequate nutrition will improve Outcome: Progressing Goal: Progress toward achieving an optimal weight will improve Outcome: Progressing   Problem: Skin Integrity: Goal: Risk for impaired skin integrity will decrease Outcome: Progressing   Problem: Tissue Perfusion: Goal: Adequacy of tissue perfusion will improve Outcome: Progressing   Problem: Education: Goal: Understanding of cardiac disease, CV risk reduction, and recovery process will improve Outcome: Progressing Goal: Individualized Educational Video(s) Outcome: Progressing   Problem: Activity: Goal: Ability to tolerate increased activity will improve Outcome: Progressing   Problem: Cardiac: Goal: Ability to achieve and maintain adequate cardiovascular perfusion will improve Outcome: Progressing   Problem: Health Behavior/Discharge Planning: Goal: Ability to safely manage health-related needs after discharge will improve Outcome: Progressing   Problem: Education: Goal: Knowledge of General Education information will improve Description: Including pain rating scale,  medication(s)/side effects and non-pharmacologic comfort measures Outcome: Progressing   Problem: Health Behavior/Discharge Planning: Goal: Ability to manage health-related needs will improve Outcome: Progressing   Problem: Clinical Measurements: Goal: Ability to maintain clinical measurements within normal limits will improve Outcome: Progressing Goal: Will remain free from infection Outcome: Progressing Goal: Diagnostic test results will improve Outcome: Progressing Goal: Respiratory complications will improve Outcome: Progressing Goal: Cardiovascular complication will be avoided Outcome: Progressing   Problem: Activity: Goal: Risk for activity intolerance will decrease Outcome: Progressing   Problem: Nutrition: Goal: Adequate nutrition will be maintained Outcome: Progressing   Problem: Coping: Goal: Level of anxiety will decrease Outcome: Progressing   Problem: Elimination: Goal: Will not experience complications related to bowel motility Outcome: Progressing Goal: Will not experience complications related to urinary retention Outcome: Progressing   Problem: Pain Managment: Goal: General experience of comfort will improve and/or be controlled Outcome: Progressing   Problem: Safety: Goal: Ability to remain free from injury will improve Outcome: Progressing   Problem: Skin Integrity: Goal: Risk for impaired skin integrity will decrease Outcome: Progressing   Problem: Education: Goal: Will demonstrate proper wound care and an understanding of methods to prevent future damage Outcome: Progressing Goal: Knowledge of disease or condition will improve Outcome: Progressing Goal: Knowledge of the prescribed therapeutic regimen will improve Outcome: Progressing Goal: Individualized Educational Video(s) Outcome: Progressing   Problem: Activity: Goal: Risk for activity intolerance will decrease Outcome: Progressing   Problem: Cardiac: Goal: Will achieve and/or  maintain hemodynamic stability Outcome: Progressing   Problem: Clinical Measurements: Goal: Postoperative complications will be avoided or minimized Outcome: Progressing   Problem: Respiratory: Goal: Respiratory status will improve Outcome: Progressing   Problem: Skin Integrity: Goal: Wound healing without signs and symptoms of infection Outcome: Progressing Goal: Risk for impaired skin integrity will decrease Outcome: Progressing   Problem: Urinary Elimination: Goal: Ability to achieve and maintain adequate renal perfusion and functioning will improve Outcome: Progressing  Dorrene Gaucher, RN

## 2023-11-04 NOTE — Plan of Care (Signed)
  Problem: Clinical Measurements: Goal: Respiratory complications will improve Outcome: Progressing   Problem: Clinical Measurements: Goal: Cardiovascular complication will be avoided Outcome: Progressing   Problem: Cardiac: Goal: Ability to achieve and maintain adequate cardiovascular perfusion will improve Outcome: Progressing   Problem: Education: Goal: Knowledge of General Education information will improve Description: Including pain rating scale, medication(s)/side effects and non-pharmacologic comfort measures Outcome: Progressing   Problem: Fluid Volume: Goal: Ability to maintain a balanced intake and output will improve Outcome: Progressing

## 2023-11-04 NOTE — Telephone Encounter (Signed)
 Received a [employer] FMLA form today for this patient's son, Juline Lucca.  Corliss is in the hospital at this time.  Myrtie Atkinson will be coming by to pay the fee and take the release of information to [facility] for her to sign.

## 2023-11-04 NOTE — Progress Notes (Signed)
 NAME:  Katelyn Cline, MRN:  [id number], DOB:  Nov 01, 1960, LOS: 10 ADMISSION DATE:  10/25/2023, CONSULTATION DATE:  5/7 REFERRING MD:  Dr. Deloise Ferries, CHIEF COMPLAINT:  CABG x 3; AVR; at[place]lip  History of Present Illness:  Patient is a 63 yo F w/ pertinent PMH of 3v CAD, moderate aortic stenosis, Afib on apixaban , COPD on 4L [place], 100 pack year history, DMT2, HTN, HLD, PAD presents to [facility] ED on 4/28 w/ unstable angina.  Patient followed by cardiology outpt. Coronary calcium  score on 06/2023 1238 in all 3 coronary arteries. Echo w/ normal systolic function and moderate aortic stenosis. 08/2023 cardaic PET was high risk. Cardiac cath showing 3 vessel disease. Plan to see TCTS for potential CABG/AVR.  On 4/28 having chest pain radiating down to left arm and jaw. Came to [facility] ed. Cards admitted for unstable angina. Started on heparin  iv and asa. Given sublingual nitroglycerin . Trop mildly elevated. Also found to have anemia and fecal occult positive. Started on PPI. GI consulted recommending continuing PPI BID and hold on EGD for now.   Pertinent  Medical History   Past Medical History:  Diagnosis Date   Agoraphobia    Anxiety    Arthritis    "all joints"   Chronic low back pain    Chronic respiratory failure with hypoxia ([name])    4L  via [place],  followed by pcp,   (09-16-2018  per pt only uses while at home and at night ,  portable oxygen  is not working)   Clotting disorder ([name]) 2013   Polycythemia vera   Colon polyps    COPD (chronic obstructive pulmonary disease) ([name])    DDD (degenerative disc disease), lumbosacral    Dental caries    DM type 2 (diabetes mellitus, type 2) ([name])    followed by pcp   Fibromyalgia    GERD (gastroesophageal reflux disease)    occasional , no meds   Heart murmur 2020   History of basal cell carcinoma (BCC) excision    s/p  Moh's of face/ nose 12/ 2013   History of palpitations    per pt found to have occaional PVCs   History of UTI    HTN (hypertension)     Hyperlipidemia    Loose, teeth    Major depression    Metastatic melanoma ([name]) 10/18/2012   12 mm posterior right upper lobe pulmonary nodule, max SUV 3.0  s/p  right wedge resection 10-21-2012,  followed by oncologist-- dr Salomon Cree,  no recurrence   Mixed stress and urge urinary incontinence    Myeloproliferative neoplasm ([name])    Narcolepsy    Obesity    OSA (obstructive sleep apnea)    Oxygen  deficiency    Oxygen  dependent    4 L via ,  per pt portable oxygen  not working but does use while at home and at night   Panic attacks    Periodontitis    Peripheral neuropathy    feet   Polycythemia vera(238.4) hematology/ oncology-- dr Salomon Cree ([facility])   first dx 02014 due to smoker/copd--  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)  hx phlebotomies   Pulmonary nodules    bilateral small stable per CT 03-11-2018   Scoliosis    Sleep apnea    Smokers' cough ([name])    per pt productive a little in the morning's   Symptoms of upper respiratory infection (URI) 03/12/2018   Urinary (tract) obstruction    Wears glasses  Significant Hospital Events: Including procedures, antibiotic start and stop dates in addition to other pertinent events   4/28 admitted w/ unstable angina; possible GIB 5/7 CABG x 3 w/ AVR; having blood loss requiring multiple PRBCs and FFP; taken back to OR overnight with no source of bleeding; left hemothorax evacuated  Interim History / Subjective:  POD1 Intubated on sbt Sedation off; mae but not following commands Almost off pressors Coox 50 today from 43 yesterday Hgb 9.7 CT outpt 190 so far this am  Objective   Blood pressure (!) 157/59, pulse 73, temperature 99.3 F (37.4 C), resp. rate (!) 27, height 5' 8.5" (1.74 m), weight 101.3 kg, SpO2 98%. PAP: (26-63)/(11-33) 55/22 CVP:  [1 mmHg-17 mmHg] 10 mmHg CO:  [2.8 L/min-5.6 L/min] 4.6 L/min CI:  [1.3 L/min/m2-2.6 L/min/m2] 2.2 L/min/m2  Vent Mode: PSV;CPAP FiO2 (%):  [50  %-60 %] 50 % Set Rate:  [16 bmp-20 bmp] 18 bmp Vt Set:  [520 mL] 520 mL PEEP:  [5 cmH20-8 cmH20] 8 cmH20 Pressure Support:  [8 cmH20-10 cmH20] 8 cmH20 Plateau Pressure:  [22 cmH20-24 cmH20] 24 cmH20   Intake/Output Summary (Last 24 hours) at 11/04/2023 1033 Last data filed at 11/04/2023 1022 Gross per 24 hour  Intake 11102.84 ml  Output 4735 ml  Net 6367.84 ml   Filed Weights   10/25/23 0032 11/02/23 2232 11/04/23 0332  Weight: 93.9 kg 100.2 kg 101.3 kg    Examination: General: critically ill appearing on mech vent HEENT: MM pink/moist; ETT in place Neuro: sedation off; mae to pain not command; cough/gag present; perrl CV: s1s2, Rate 70, no m/r/g PULM:  dim BS bilaterally; on mech vent psv GI: soft, bsx4 active  Extremities: warm/dry, no edema   Resolved Hospital Problem list     Assessment & Plan:  Postoperative vent management Plan: - sbt; if wakes up consider extubation - Wean FiO2 for O2 sat > 90% - Daily WUA/SBT - VAP bundle - Pulmonary hygiene  S/p 3-vessel CABG and AVR, Atri clip, MAZE Moderate aortic stenosis CAD HTN HLD Plan: - wean pressors down/off for map >65 - trend H/H; monitor CT outpt - Continue ASA and statin - wean insulin  gtt per protocol; consider transitioning off later today - Postoperative care per TCTS - pain management - CT management per protocol - trend coox  COPD Tobacco abuse -on 3L [place] at home Plan: -duoneb; yupelri/brovana -cessation counseling when appropriate -consider nicotine  patch   Polycythemia Vera Anemia w/ GIB Plan: -GI consulted; recommend PPI BID and hold on EGD for now -trend cbc -hold hydroxyurea  -heparin  on hold  DMT2 Plan: -insuling gtt per protocol post op -cbg monitoring  Chronic Pain Plan: -pain management post op   PAD with Claudication Plan: -f/u outpt  -asa and statin  Depression Plan: -continue bupropion  when able to take po   Peripheral Neuropathy Plan: -duloxetine  when able to  take po   Best Practice (right click and "Reselect all SmartList Selections" daily)   Diet/type: NPO w/ meds via tube DVT prophylaxis: SCD GI prophylaxis: PPI Lines: Central line and Arterial Line Foley:  Yes, and it is still needed Code Status:  full code Last date of multidisciplinary goals of care discussion [per primary]  Labs   CBC: Recent Labs  Lab 11/01/23 0412 11/03/23 0441 11/03/23 0905 11/03/23 1252 11/03/23 1304 11/03/23 1530 11/03/23 1536 11/03/23 1717 11/03/23 1829 11/03/23 2259 11/03/23 2306 11/04/23 0500  WBC 12.2* 13.5*  --   --   --  36.3*  --   --   --  18.9*  --  15.8*  HGB 9.1* 9.5*   < > 6.7*   < > 6.3*   < > 7.1* 7.5* 10.4* 9.5* 9.7*  HCT 32.1* 33.9*   < > 23.1*   < > 22.9*   < > 21.0* 22.0* 30.9* 28.0* 29.0*  MCV 94.1 94.4  --   --   --  99.6  --   --   --  88.8  --  88.7  PLT 433* 463*  --  348  --  418*  --   --   --  190  --  199   < > = values in this interval not displayed.    Basic Metabolic Panel: Recent Labs  Lab 10/31/23 0442 11/01/23 0412 11/03/23 0441 11/03/23 0905 11/03/23 1233 11/03/23 1304 11/03/23 1333 11/03/23 1417 11/03/23 1420 11/03/23 1717 11/03/23 1829 11/03/23 2259 11/03/23 2306 11/04/23 0500  NA 139 139 141   < > 140   < > 139 141   < > 139 142 142 140 142  K 4.2 4.2 4.3   < > 4.0   < > 4.4 4.0   < > 6.0* 5.9* 3.9 3.9 3.7  CL 103 103 102   < > 103  --  104 104  --   --   --  104  --  107  CO2 28 28 32  --   --   --   --   --   --   --   --  23  --  26  GLUCOSE 155* 129* 135*   < > 129*  --  140* 123*  --   --   --  191*  --  135*  BUN 24* 24* 27*   < > 25*  --  25* 29*  --   --   --  24*  --  28*  CREATININE 0.89 0.81 0.76   < > 0.60  --  0.60 0.70  --   --   --  0.85  --  0.95  CALCIUM  9.0 9.1 9.5  --   --   --   --   --   --   --   --  9.5  --  9.3  MG  --   --   --   --   --   --   --   --   --   --   --  2.4  --  2.2   < > = values in this interval not displayed.   GFR: Estimated Creatinine Clearance:  74.2 mL/min (by C-G formula based on SCr of 0.95 mg/dL). Recent Labs  Lab 11/03/23 0441 11/03/23 1530 11/03/23 2259 11/04/23 0500  WBC 13.5* 36.3* 18.9* 15.8*    Liver Function Tests: No results for input(s): "AST", "ALT", "ALKPHOS", "BILITOT", "PROT", "ALBUMIN" in the last 168 hours.  No results for input(s): "LIPASE", "AMYLASE" in the last 168 hours. No results for input(s): "AMMONIA" in the last 168 hours.  ABG    Component Value Date/Time   PHART 7.443 11/03/2023 2306   PCO2ART 36.8 11/03/2023 2306   PO2ART 127 (H) 11/03/2023 2306   HCO3 25.0 11/03/2023 2306   TCO2 26 11/03/2023 2306   ACIDBASEDEF 9.0 (H) 11/03/2023 1829   O2SAT 50.6 11/04/2023 0503     Coagulation Profile: Recent Labs  Lab 11/03/23 1530 11/03/23 2259  INR 1.5* 2.1*    Cardiac Enzymes: No results for input(s): "CKTOTAL", "CKMB", "CKMBINDEX", "  TROPONINI" in the last 168 hours.  HbA1C: Hgb A1c MFr Bld  Date/Time Value Ref Range Status  10/28/2023 03:59 AM 5.1 4.8 - 5.6 % Final    Comment:    (NOTE) Pre diabetes:          5.7%-6.4%  Diabetes:              >6.4%  Glycemic control for   <7.0% adults with diabetes   11/17/2022 10:30 AM 5.8 4.6 - 6.5 % Final    Comment:    Glycemic Control Guidelines for People with Diabetes:Non Diabetic:  <6%Goal of Therapy: <7%Additional Action Suggested:  >8%     CBG: Recent Labs  Lab 11/04/23 0239 11/04/23 0345 11/04/23 0455 11/04/23 0558 11/04/23 0852  GLUCAP 164* 145* 144* 127* 130*    Review of Systems:   Patient is intubated; therefore, history has been obtained from chart review.    Past Medical History:  She,  has a past medical history of Agoraphobia, Anxiety, Arthritis, Chronic low back pain, Chronic respiratory failure with hypoxia ([name]), Clotting disorder ([name]) (2013), Colon polyps, COPD (chronic obstructive pulmonary disease) ([name]), DDD (degenerative disc disease), lumbosacral, Dental caries, DM type 2 (diabetes mellitus, type 2)  ([name]), Fibromyalgia, GERD (gastroesophageal reflux disease), Heart murmur (2020), History of basal cell carcinoma (BCC) excision, History of palpitations, History of UTI, HTN (hypertension), Hyperlipidemia, Loose, teeth, Major depression, Metastatic melanoma ([name]) (10/18/2012), Mixed stress and urge urinary incontinence, Myeloproliferative neoplasm ([name]), Narcolepsy, Obesity, OSA (obstructive sleep apnea), Oxygen  deficiency, Oxygen  dependent, Panic attacks, Periodontitis, Peripheral neuropathy, Polycythemia vera(238.4) (hematology/ oncology-- dr Salomon Cree ([facility])), Pulmonary nodules, Scoliosis, Sleep apnea, Smokers' cough ([name]), Symptoms of upper respiratory infection (URI) (03/12/2018), Urinary (tract) obstruction, and Wears glasses.   Surgical History:   Past Surgical History:  Procedure Laterality Date   ABDOMINAL HYSTERECTOMY  1998   partial   AORTIC VALVE REPLACEMENT N/A 11/03/2023   Procedure: REPLACEMENT, AORTIC VALVE, OPEN USING INSPIRIS RESILIA AORTIC VALVE;  Surgeon: Hilarie Lovely, MD;  Location: [facility] OR;  Service: Open Heart Surgery;  Laterality: N/A;   BREAST LUMPECTOMY Bilateral 1995   benign per pt   CARDIOVERSION N/A 09/06/2023   Procedure: CARDIOVERSION;  Surgeon: Luana Rumple, MD;  Location: [facility] INVASIVE CV LAB;  Service: Cardiovascular;  Laterality: N/A;   CLIPPING OF ATRIAL APPENDAGE N/A 11/03/2023   Procedure: CLIPPING, LEFT ATRIAL APPENDAGE USING ATRICURE ATRICLIP;  Surgeon: Hilarie Lovely, MD;  Location: [facility] OR;  Service: Open Heart Surgery;  Laterality: N/A;   COLONOSCOPY W/ BIOPSIES AND POLYPECTOMY     Hx: of   CORONARY ARTERY BYPASS GRAFT N/A 11/03/2023   Procedure: CORONARY ARTERY BYPASS GRAFTING X 3, USING LEFT INTERNAL MAMMARY ARTERY AND RIGHT ENDOSCOPIC HARVESTED GREATER SAPHENOUS VEIN;  Surgeon: Hilarie Lovely, MD;  Location: [facility] OR;  Service: Open Heart Surgery;  Laterality: N/A;   CYSTECTOMY  2000   abdominal wall    DILATION AND  CURETTAGE OF UTERUS  yrs ago   EXPLORATION POST OPERATIVE OPEN HEART N/A 11/03/2023   Procedure: EXPLORATION POST OPERATIVE OPEN HEART; WASHOUT;  Surgeon: Hilarie Lovely, MD;  Location: [facility] OR;  Service: Open Heart Surgery;  Laterality: N/A;   MOHS SURGERY  2013   nose/face   MULTIPLE EXTRACTIONS WITH ALVEOLOPLASTY N/A 09/19/2018   Procedure: Extraction of tooth #'s 2, 3, 6-14, 18, and 20-30 with alveoloplasty;  Surgeon: Kalyssa Chroman, DDS;  Location: [facility] ORS;  Service: Oral Surgery;  Laterality: N/A;  GENERAL WITH NASAL  TUBE   RIGHT/LEFT HEART CATH AND CORONARY ANGIOGRAPHY N/A 10/01/2023   Procedure: RIGHT/LEFT HEART CATH AND CORONARY ANGIOGRAPHY;  Surgeon: Arleen Lacer, MD;  Location: [facility] INVASIVE CV LAB;  Service: Cardiovascular;  Laterality: N/A;   TEE WITHOUT CARDIOVERSION N/A 11/03/2023   Procedure: ECHOCARDIOGRAM, TRANSESOPHAGEAL;  Surgeon: Hilarie Lovely, MD;  Location: [facility] OR;  Service: Open Heart Surgery;  Laterality: N/A;   TEE WITHOUT CARDIOVERSION N/A 11/03/2023   Procedure: ECHOCARDIOGRAM, TRANSESOPHAGEAL;  Surgeon: Hilarie Lovely, MD;  Location: [facility] OR;  Service: Open Heart Surgery;  Laterality: N/A;   VIDEO ASSISTED THORACOSCOPY (VATS)/WEDGE RESECTION Right 10/21/2012   Procedure: VIDEO ASSISTED THORACOSCOPY (VATS)/WEDGE RESECTION;  Surgeon: Norita Beauvais, MD;  Location[facility] OR;  Service: Thoracic;  Laterality: Right;   VIDEO BRONCHOSCOPY N/A 10/21/2012   Procedure: VIDEO BRONCHOSCOPY;  Surgeon: Norita Beauvais, MD;  Location: [facility] OR;  Service: Thoracic;  Laterality: N/A;     Social History:   reports that she has been smoking cigarettes. She has a 25 pack-year smoking history. She has never used smokeless tobacco. She reports current alcohol  use of about 1.0 standard drink of alcohol  per week. She reports that she does not use drugs.   Family History:  Her family history includes ADD / ADHD in her son; Alcohol  abuse in her maternal grandfather; Anxiety  disorder in her mother; Arthritis in her mother; Dementia in her father and mother; Depression in her mother; Heart disease in her brother and father; Hyperlipidemia in her father and mother; Hypertension in her father; Stroke in her father. There is no history of Bipolar disorder, Drug abuse, OCD, Paranoid behavior, Schizophrenia, Seizures, Sexual abuse, or Physical abuse.   Allergies Allergies  Allergen Reactions   Contrast Media [Iodinated Contrast Media] Anaphylaxis    CAT Scan Conrast    Erythromycin     Stomach pain   Flagyl [Metronidazole] Other (See Comments)    Generalized pain, caused sickness   Other     Iodine Dye   Penicillins Other (See Comments)    Patient was an infant, no idea of reaction. Tolerates Keflex . Did it involve swelling of the face/tongue/throat, SOB, or low BP? Unknown Did it involve sudden or severe rash/hives, skin peeling, or any reaction on the inside of your mouth or nose? Unknown Did you need to seek medical attention at a hospital or doctor's office? Unknown When did it last happen?      infant If all above answers are "NO", may proceed with cephalosporin use. CHILDHOOD ALLERGY    Tetracyclines & Related Other (See Comments)    GI side effects     Home Medications  Prior to Admission medications   Medication Sig Start Date End Date Taking? Authorizing Provider  albuterol  (VENTOLIN  HFA) 108 (90 Base) MCG/ACT inhaler Inhale 2 puffs into the lungs every 6 (six) hours as needed for wheezing or shortness of breath (as needed). 06/18/23  Yes Christel Cousins, MD  aspirin  EC 81 MG tablet Take 1 tablet (81 mg total) by mouth daily. Swallow whole. 11/17/22  Yes Christel Cousins, MD  atorvastatin  (LIPITOR) 80 MG tablet Take 1 tablet by mouth daily at bedtime. 10/06/23  Yes Christel Cousins, MD  buPROPion  ER (WELLBUTRIN  SR) 100 MG 12 hr tablet Take 1 tablet by mouth every morning and take 1 tablet by mouth every day at bedtime. 10/06/23  Yes Christel Cousins, MD   diltiazem  (CARDIZEM  CD) 120 MG 24 hr capsule Take 1 capsule (120 mg  total) by mouth daily. Patient taking differently: Take 120 mg by mouth at bedtime. 09/07/23  Yes Christel Cousins, MD  DULoxetine  (CYMBALTA ) 60 MG capsule Take 1 capsule by mouth twice daily. 08/07/23  Yes Christel Cousins, MD  ELIQUIS  5 MG TABS tablet Take 1 tablet (5 mg total) by mouth 2 (two) times daily. 09/07/23  Yes Christel Cousins, MD  ezetimibe  (ZETIA ) 10 MG tablet Take 1 tablet by mouth every morning. 10/06/23  Yes Christel Cousins, MD  fenofibrate  (TRICOR ) 145 MG tablet Take 1 tablet by mouth daily at bedtime. 10/06/23  Yes Christel Cousins, MD  fish oil-omega-3 fatty acids  1000 MG capsule Take 2 capsules (2 g total) by mouth 2 (two) times daily. 11/17/22  Yes Christel Cousins, MD  furosemide  (LASIX ) 20 MG tablet Take 1 tablet (20 mg total) by mouth daily as needed. 04/07/23  Yes Christel Cousins, MD  hydrochlorothiazide  (HYDRODIURIL ) 25 MG tablet Take 1 tablet by mouth every morning. 10/06/23  Yes Christel Cousins, MD  hydroxyurea  (HYDREA ) 500 MG capsule Take 2 capsules by mouth daily at bedtime with an additional 1 capsule on Monday and Thursday. Patient taking differently: Take 1,000-1,500 mg by mouth See admin instructions. Take 2 capsules at daily at bedtime with 1 additional capsule on Mon, Wed, Fri nigtht 10/06/23  Yes Frankie Israel, MD  isosorbide  mononitrate (IMDUR ) 30 MG 24 hr tablet Take 1 tablet (30 mg total) by mouth at bedtime. 10/02/23 11/01/23 Yes Arleen Lacer, MD  lisinopril  (ZESTRIL ) 5 MG tablet Take 1 tablet by mouth daily at bedtime. 10/06/23  Yes Christel Cousins, MD  metFORMIN  (GLUCOPHAGE ) 1000 MG tablet Take 1 tablet by mouth every morning and take 1 tablet by mouth every day at bedtime. 10/06/23  Yes Christel Cousins, MD  nitroGLYCERIN  (NITROSTAT ) 0.4 MG SL tablet Place 1 tablet (0.4 mg total) under the tongue every 5 (five) minutes as needed for chest pain (up to 3 doses. If taking 3rd dose call 911). 10/02/23   Yes Arleen Lacer, MD  nystatin  (MYCOSTATIN /NYSTOP ) powder Apply 1 Application topically 3 (three) times daily. PA Case: [id number] Approved 06/30/2019- 06/28/2020 Patient taking differently: Apply 1 Application topically 3 (three) times daily as needed (irritation). PA Case: [id number] Approved 06/30/2019- 06/28/2020 05/18/22  Yes Christel Cousins, MD  nystatin  cream (MYCOSTATIN ) Apply topically twice daily. Patient taking differently: Apply 1 Application topically 2 (two) times daily as needed for dry skin. 07/11/23  Yes Christel Cousins, MD  oxyCODONE -acetaminophen  (PERCOCET) 10-325 MG tablet Take 1 tablet by mouth every 4 (four) hours as needed for severe pain (pain score 7-10). 07/18/18  Yes Henderson, Marvell Slider, MD  potassium chloride  SA (KLOR-CON  M) 20 MEQ tablet Take 1 tablet (20 mEq total) by mouth daily as needed. Take if take furosemide  04/07/23  Yes Christel Cousins, MD  senna (SENOKOT) 8.6 MG TABS tablet Take 2 tablets by mouth daily as needed for mild constipation.   Yes [provider]  Vitamin D , Ergocalciferol , (DRISDOL ) 1.25 MG (50000 UNIT) CAPS capsule Take 1 capsule by mouth on Sundays at bedtime. 09/07/23  Yes Christel Cousins, MD  Blood Glucose Monitoring Suppl (BLOOD GLUCOSE SYSTEM PAK) KIT Please dispense based on patient and insurance preference. Use as directed to monitor FSBS 2x daily. Dx: E11.9 10/18/19   Mathis Som, MD  glucose blood (ACCU-CHEK GUIDE) test strip Use as instructed 04/27/22   Christel Cousins, MD  Glucose Blood (BLOOD GLUCOSE  TEST STRIPS) STRP Please dispense based on patient and insurance preference. Use as directed to monitor FSBS 2x daily. Dx: E11.9 10/18/19   Mathis Som, MD  Microlet Lancets MISC Please dispense based on patient and insurance preference. Use as directed to monitor FSBS 2x daily. Dx: E11.9 10/18/19   Mathis Som, MD     Critical care time: 35 minutes    JD Vira Grieves [facility] 11/04/2023, 10:33  AM  Please see [email or web address] for pager details.  From 7A-7P if no response, please call [phone]. After hours, please call ELink [phone].

## 2023-11-04 NOTE — Progress Notes (Signed)
 Pt. Arrived from OR to 2H12 at 1520. On arrival, pt. Is oozing from CT sites; bp labile. Titrating up on pressors. Felipe Horton, MD and Zaida Hertz, MD at bedside giving orders. Deloise Ferries, MD contacted and at bedside. Vasopressin, Epi, Methylene blue added in for pressure support. Total CT output ~1200 mL in 4 hours. Lightfoot, MD to take pt. Back to the OR. VSS upon transportation from 2H to OR. Care continues

## 2023-11-04 NOTE — Consult Note (Addendum)
 Advanced Heart Failure Team Consult Note   Primary Physician: Christel Cousins, MD Cardiologist:  Lauro Portal, MD  Reason for Consultation: Post Cardiotomy, AVR bioprosthetic, CABG   HPI:    Katelyn Cline is seen today for evaluation of post cardiotomy at the request of Dr Felipe Horton.   Katelyn Cline is 63 year old with history of CAD, PAF 2018, COPD, PAD, HTN, DMII,metastatic melanoma, wedge resection RUL metastatic melanoma, fibromyalgia, chronic respiratory failure (uses 4-5 liters [place]), and tobacco abuse. Functionally limited by back pain.   September 2024 she went in A fib. Had cardioversion 09/06/23. She has been on eliquis .    Echo 06/2023 - LVEF 55-60% RV normal. Moderate Aortic Stenosis. Calcium  score 1238.  She saw Dr Katheryne Pane. Set up for Cardiac PET with decreased blood flow 3 coronaries. ABI with severe SFA diease bilaterally. Set up for cath. RHC/LHC in April severe multivessel disease. Referred to CT surgery.   Admitted 10/25/23 with chest pain. CT Surgery consulted for CABG AVR.  Intraoperative TEE- LVEF normal. S/P CABG AVR LAA clipping. Returned to the OR for bleeding. Post op CO-OX went down. Neo switched to Epi and Norepi.  Today she has been weaned off all drips except Vaso 0.02. Remains intubated. Sedation off this morning. Minimal CT drainage.   CO-OX 51%.  Swan #s  CVP 8 PA 41/18 (26) SVR 1200 CO 4.6 CI 2.16  Home Medications Prior to Admission medications   Medication Sig Start Date End Date Taking? Authorizing Provider  albuterol  (VENTOLIN  HFA) 108 (90 Base) MCG/ACT inhaler Inhale 2 puffs into the lungs every 6 (six) hours as needed for wheezing or shortness of breath (as needed). 06/18/23  Yes Christel Cousins, MD  aspirin  EC 81 MG tablet Take 1 tablet (81 mg total) by mouth daily. Swallow whole. 11/17/22  Yes Christel Cousins, MD  atorvastatin  (LIPITOR) 80 MG tablet Take 1 tablet by mouth daily at bedtime. 10/06/23  Yes Christel Cousins, MD  buPROPion  ER  (WELLBUTRIN  SR) 100 MG 12 hr tablet Take 1 tablet by mouth every morning and take 1 tablet by mouth every day at bedtime. 10/06/23  Yes Christel Cousins, MD  diltiazem  (CARDIZEM  CD) 120 MG 24 hr capsule Take 1 capsule (120 mg total) by mouth daily. Patient taking differently: Take 120 mg by mouth at bedtime. 09/07/23  Yes Christel Cousins, MD  DULoxetine  (CYMBALTA ) 60 MG capsule Take 1 capsule by mouth twice daily. 08/07/23  Yes Christel Cousins, MD  ELIQUIS  5 MG TABS tablet Take 1 tablet (5 mg total) by mouth 2 (two) times daily. 09/07/23  Yes Christel Cousins, MD  ezetimibe  (ZETIA ) 10 MG tablet Take 1 tablet by mouth every morning. 10/06/23  Yes Christel Cousins, MD  fenofibrate  (TRICOR ) 145 MG tablet Take 1 tablet by mouth daily at bedtime. 10/06/23  Yes Christel Cousins, MD  fish oil-omega-3 fatty acids  1000 MG capsule Take 2 capsules (2 g total) by mouth 2 (two) times daily. 11/17/22  Yes Christel Cousins, MD  furosemide  (LASIX ) 20 MG tablet Take 1 tablet (20 mg total) by mouth daily as needed. 04/07/23  Yes Christel Cousins, MD  hydrochlorothiazide  (HYDRODIURIL ) 25 MG tablet Take 1 tablet by mouth every morning. 10/06/23  Yes Christel Cousins, MD  hydroxyurea  (HYDREA ) 500 MG capsule Take 2 capsules by mouth daily at bedtime with an additional 1 capsule on Monday and Thursday. Patient taking differently: Take 1,000-1,500 mg by mouth See  admin instructions. Take 2 capsules at daily at bedtime with 1 additional capsule on Mon, Wed, Fri nigtht 10/06/23  Yes Frankie Israel, MD  isosorbide  mononitrate (IMDUR ) 30 MG 24 hr tablet Take 1 tablet (30 mg total) by mouth at bedtime. 10/02/23 11/01/23 Yes Arleen Lacer, MD  lisinopril  (ZESTRIL ) 5 MG tablet Take 1 tablet by mouth daily at bedtime. 10/06/23  Yes Christel Cousins, MD  metFORMIN  (GLUCOPHAGE ) 1000 MG tablet Take 1 tablet by mouth every morning and take 1 tablet by mouth every day at bedtime. 10/06/23  Yes Christel Cousins, MD  nitroGLYCERIN  (NITROSTAT ) 0.4 MG SL  tablet Place 1 tablet (0.4 mg total) under the tongue every 5 (five) minutes as needed for chest pain (up to 3 doses. If taking 3rd dose call 911). 10/02/23  Yes Arleen Lacer, MD  nystatin  (MYCOSTATIN /NYSTOP ) powder Apply 1 Application topically 3 (three) times daily. PA Case: [id number] Approved 06/30/2019- 06/28/2020 Patient taking differently: Apply 1 Application topically 3 (three) times daily as needed (irritation). PA Case: [id number] Approved 06/30/2019- 06/28/2020 05/18/22  Yes Christel Cousins, MD  nystatin  cream (MYCOSTATIN ) Apply topically twice daily. Patient taking differently: Apply 1 Application topically 2 (two) times daily as needed for dry skin. 07/11/23  Yes Christel Cousins, MD  oxyCODONE -acetaminophen  (PERCOCET) 10-325 MG tablet Take 1 tablet by mouth every 4 (four) hours as needed for severe pain (pain score 7-10). 07/18/18  Yes [place], Marvell Slider, MD  potassium chloride  SA (KLOR-CON  M) 20 MEQ tablet Take 1 tablet (20 mEq total) by mouth daily as needed. Take if take furosemide  04/07/23  Yes Christel Cousins, MD  senna (SENOKOT) 8.6 MG TABS tablet Take 2 tablets by mouth daily as needed for mild constipation.   Yes [provider]  Vitamin D , Ergocalciferol , (DRISDOL ) 1.25 MG (50000 UNIT) CAPS capsule Take 1 capsule by mouth on Sundays at bedtime. 09/07/23  Yes Christel Cousins, MD  Blood Glucose Monitoring Suppl (BLOOD GLUCOSE SYSTEM PAK) KIT Please dispense based on patient and insurance preference. Use as directed to monitor FSBS 2x daily. Dx: E11.9 10/18/19   Mathis Som, MD  glucose blood (ACCU-CHEK GUIDE) test strip Use as instructed 04/27/22   Christel Cousins, MD  Glucose Blood (BLOOD GLUCOSE TEST STRIPS) STRP Please dispense based on patient and insurance preference. Use as directed to monitor FSBS 2x daily. Dx: E11.9 10/18/19   Mathis Som, MD  Microlet Lancets MISC Please dispense based on patient and insurance preference. Use as directed to monitor FSBS 2x daily.  Dx: E11.9 10/18/19   Mathis Som, MD    Past Medical History: Past Medical History:  Diagnosis Date   Agoraphobia    Anxiety    Arthritis    "all joints"   Chronic low back pain    Chronic respiratory failure with hypoxia ([name])    4L  via [place],  followed by pcp,   (09-16-2018  per pt only uses while at home and at night ,  portable oxygen  is not working)   Clotting disorder ([name]) 2013   Polycythemia vera   Colon polyps    COPD (chronic obstructive pulmonary disease) ([name])    DDD (degenerative disc disease), lumbosacral    Dental caries    DM type 2 (diabetes mellitus, type 2) ([name])    followed by pcp   Fibromyalgia    GERD (gastroesophageal reflux disease)    occasional , no meds   Heart murmur 2020  History of basal cell carcinoma (BCC) excision    s/p  Moh's of face/ nose 12/ 2013   History of palpitations    per pt found to have occaional PVCs   History of UTI    HTN (hypertension)    Hyperlipidemia    Loose, teeth    Major depression    Metastatic melanoma ([name]) 10/18/2012   12 mm posterior right upper lobe pulmonary nodule, max SUV 3.0  s/p  right wedge resection 10-21-2012,  followed by oncologist-- dr Salomon Cree,  no recurrence   Mixed stress and urge urinary incontinence    Myeloproliferative neoplasm ([name])    Narcolepsy    Obesity    OSA (obstructive sleep apnea)    Oxygen  deficiency    Oxygen  dependent    4 L via [place],  per pt portable oxygen  not working but does use while at home and at night   Panic attacks    Periodontitis    Peripheral neuropathy    feet   Polycythemia vera(238.4) hematology/ oncology-- dr Salomon Cree ([facility])   first dx 02014 due to smoker/copd--  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)  hx phlebotomies   Pulmonary nodules    bilateral small stable per CT 03-11-2018   Scoliosis    Sleep apnea    Smokers' cough ([name])    per pt productive a little in the morning's   Symptoms of upper respiratory infection (URI)  03/12/2018   Urinary (tract) obstruction    Wears glasses     Past Surgical History: Past Surgical History:  Procedure Laterality Date   ABDOMINAL HYSTERECTOMY  1998   partial   BREAST LUMPECTOMY Bilateral 1995   benign per pt   CARDIOVERSION N/A 09/06/2023   Procedure: CARDIOVERSION;  Surgeon: Luana Rumple, MD;  Location: [facility] INVASIVE CV LAB;  Service: Cardiovascular;  Laterality: N/A;   COLONOSCOPY W/ BIOPSIES AND POLYPECTOMY     Hx: of   CYSTECTOMY  2000   abdominal wall    DILATION AND CURETTAGE OF UTERUS  yrs ago   MOHS SURGERY  2013   nose/face   MULTIPLE EXTRACTIONS WITH ALVEOLOPLASTY N/A 09/19/2018   Procedure: Extraction of tooth #'s 2, 3, 6-14, 18, and 20-30 with alveoloplasty;  Surgeon: Kaili Chroman, DDS;  Location: [facility] ORS;  Service: Oral Surgery;  Laterality: N/A;  GENERAL WITH NASAL TUBE   RIGHT/LEFT HEART CATH AND CORONARY ANGIOGRAPHY N/A 10/01/2023   Procedure: RIGHT/LEFT HEART CATH AND CORONARY ANGIOGRAPHY;  Surgeon: Arleen Lacer, MD;  Location: [facility] INVASIVE CV LAB;  Service: Cardiovascular;  Laterality: N/A;   VIDEO ASSISTED THORACOSCOPY (VATS)/WEDGE RESECTION Right 10/21/2012   Procedure: VIDEO ASSISTED THORACOSCOPY (VATS)/WEDGE RESECTION;  Surgeon: Norita Beauvais, MD;  Location: [facility] OR;  Service: Thoracic;  Laterality: Right;   VIDEO BRONCHOSCOPY N/A 10/21/2012   Procedure: VIDEO BRONCHOSCOPY;  Surgeon: Norita Beauvais, MD;  Location: [facility] OR;  Service: Thoracic;  Laterality: N/A;    Family History: Family History  Problem Relation Age of Onset   Arthritis Mother    Hyperlipidemia Mother    Depression Mother    Anxiety disorder Mother    Dementia Mother    Hypertension Father    Hyperlipidemia Father    Heart disease Father    Stroke Father    Dementia Father    Heart disease Brother    ADD / ADHD Son    Alcohol  abuse Maternal Grandfather    Bipolar disorder Neg Hx    Drug abuse Neg  Hx    OCD Neg Hx    Paranoid behavior Neg Hx     Schizophrenia Neg Hx    Seizures Neg Hx    Sexual abuse Neg Hx    Physical abuse Neg Hx     Social History: Social History   Socioeconomic History   Marital status: Divorced    Spouse name: Not on file   Number of children: 1   Years of education: 12+   Highest education level: Not on file  Occupational History   Occupation: disabled  Tobacco Use   Smoking status: Every Day    Current packs/day: 0.50    Average packs/day: 0.5 packs/day for 50.0 years (25.0 ttl pk-yrs)    Types: Cigarettes   Smokeless tobacco: Never   Tobacco comments:    Current smoker 10 cigarettes daily 06/10/23  Vaping Use   Vaping status: Former   Quit date: 09/15/2016  Substance and Sexual Activity   Alcohol  use: Yes    Alcohol /week: 1.0 standard drink of alcohol     Types: 1 Standard drinks or equivalent per week    Comment: Not weekly   Drug use: Never   Sexual activity: Not Currently    Birth control/protection: Surgical  Other Topics Concern   Not on file  Social History Narrative   Son-lives w/son.   disabled   Social Drivers of Corporate investment banker Strain: Low Risk  (12/24/2022)   Overall Financial Resource Strain (CARDIA)    Difficulty of Paying Living Expenses: Not hard at all  Food Insecurity: No Food Insecurity (10/25/2023)   Hunger Vital Sign    Worried About Running Out of Food in the Last Year: Never true    Ran Out of Food in the Last Year: Never true  Transportation Needs: No Transportation Needs (10/25/2023)   PRAPARE - Administrator, Civil Service (Medical): No    Lack of Transportation (Non-Medical): No  Physical Activity: Inactive (12/24/2022)   Exercise Vital Sign    Days of Exercise per Week: 0 days    Minutes of Exercise per Session: 0 min  Stress: Stress Concern Present (12/24/2022)   [employer] of Occupational Health - Occupational Stress Questionnaire    Feeling of Stress : Rather much  Social Connections: Socially Isolated (10/25/2023)    Social Connection and Isolation Panel [NHANES]    Frequency of Communication with Friends and Family: Once a week    Frequency of Social Gatherings with Friends and Family: Once a week    Attends Religious Services: Never    Database administrator or Organizations: No    Attends Banker Meetings: Never    Marital Status: Divorced    Allergies:  Allergies  Allergen Reactions   Contrast Media [Iodinated Contrast Media] Anaphylaxis    CAT Scan Conrast    Erythromycin     Stomach pain   Flagyl [Metronidazole] Other (See Comments)    Generalized pain, caused sickness   Other     Iodine Dye   Penicillins Other (See Comments)    Patient was an infant, no idea of reaction. Tolerates Keflex . Did it involve swelling of the face/tongue/throat, SOB, or low BP? Unknown Did it involve sudden or severe rash/hives, skin peeling, or any reaction on the inside of your mouth or nose? Unknown Did you need to seek medical attention at a hospital or doctor's office? Unknown When did it last happen?      infant If all above answers are "  NO", may proceed with cephalosporin use. CHILDHOOD ALLERGY    Tetracyclines & Related Other (See Comments)    GI side effects    Objective:    Vital Signs:   Temp:  [96.6 F (35.9 C)-100.9 F (38.3 C)] 99.9 F (37.7 C) (05/08 0700) Pulse Rate:  [61-94] 66 (05/08 0700) Resp:  [0-49] 18 (05/08 0700) BP: (81-127)/(49-72) 127/61 (05/08 0700) SpO2:  [88 %-100 %] 98 % (05/08 0700) Arterial Line BP: (64-159)/(36-90) 103/83 (05/08 0700) FiO2 (%):  [50 %-60 %] 50 % (05/08 0813) Weight:  [101.3 kg] 101.3 kg (05/08 0332) Last BM Date : 11/02/23  Weight change: Filed Weights   10/25/23 0032 11/02/23 2232 11/04/23 0332  Weight: 93.9 kg 100.2 kg 101.3 kg    Intake/Output:   Intake/Output Summary (Last 24 hours) at 11/04/2023 0922 Last data filed at 11/04/2023 0700 Gross per 24 hour  Intake 10845.23 ml  Output 4745 ml  Net 6100.23 ml     CVP  8-9 Physical Exam    General:  Intubated HEENT: normal Neck: supple. JVP 8-9  Carotids 2+ bilat; no bruits. No lymphadenopathy or thyromegaly appreciated. Cor: PMI nondisplaced. Regular rate & rhythm. No rubs, gallops or murmurs. Lungs: clear Abdomen: soft.  Extremities: no cyanosis, clubbing, rash, R and LLE doppler pulses R/L 1-2+ edema Neuro: Intubated   Telemetry   SR   EKG      Labs   Basic Metabolic Panel: Recent Labs  Lab 10/31/23 0442 11/01/23 0412 11/03/23 0441 11/03/23 0905 11/03/23 1233 11/03/23 1304 11/03/23 1333 11/03/23 1417 11/03/23 1420 11/03/23 1717 11/03/23 1829 11/03/23 2259 11/03/23 2306 11/04/23 0500  NA 139 139 141   < > 140   < > 139 141   < > 139 142 142 140 142  K 4.2 4.2 4.3   < > 4.0   < > 4.4 4.0   < > 6.0* 5.9* 3.9 3.9 3.7  CL 103 103 102   < > 103  --  104 104  --   --   --  104  --  107  CO2 28 28 32  --   --   --   --   --   --   --   --  23  --  26  GLUCOSE 155* 129* 135*   < > 129*  --  140* 123*  --   --   --  191*  --  135*  BUN 24* 24* 27*   < > 25*  --  25* 29*  --   --   --  24*  --  28*  CREATININE 0.89 0.81 0.76   < > 0.60  --  0.60 0.70  --   --   --  0.85  --  0.95  CALCIUM  9.0 9.1 9.5  --   --   --   --   --   --   --   --  9.5  --  9.3  MG  --   --   --   --   --   --   --   --   --   --   --  2.4  --  2.2   < > = values in this interval not displayed.    Liver Function Tests: No results for input(s): "AST", "ALT", "ALKPHOS", "BILITOT", "PROT", "ALBUMIN" in the last 168 hours. No results for input(s): "LIPASE", "AMYLASE" in the last 168 hours. No results for input(s): "  AMMONIA" in the last 168 hours.  CBC: Recent Labs  Lab 11/01/23 0412 11/03/23 0441 11/03/23 0905 11/03/23 1252 11/03/23 1304 11/03/23 1530 11/03/23 1536 11/03/23 1717 11/03/23 1829 11/03/23 2259 11/03/23 2306 11/04/23 0500  WBC 12.2* 13.5*  --   --   --  36.3*  --   --   --  18.9*  --  15.8*  HGB 9.1* 9.5*   < > 6.7*   < > 6.3*   < >  7.1* 7.5* 10.4* 9.5* 9.7*  HCT 32.1* 33.9*   < > 23.1*   < > 22.9*   < > 21.0* 22.0* 30.9* 28.0* 29.0*  MCV 94.1 94.4  --   --   --  99.6  --   --   --  88.8  --  88.7  PLT 433* 463*  --  348  --  418*  --   --   --  190  --  199   < > = values in this interval not displayed.    Cardiac Enzymes: No results for input(s): "CKTOTAL", "CKMB", "CKMBINDEX", "TROPONINI" in the last 168 hours.  BNP: BNP (last 3 results) Recent Labs    07/16/23 1454  BNP 173.5*    ProBNP (last 3 results) No results for input(s): "PROBNP" in the last 8760 hours.   CBG: Recent Labs  Lab 11/04/23 0239 11/04/23 0345 11/04/23 0455 11/04/23 0558 11/04/23 0852  GLUCAP 164* 145* 144* 127* 130*    Coagulation Studies: Recent Labs    11/03/23 1530 11/03/23 2259  LABPROT 18.4* 23.6*  INR 1.5* 2.1*     Imaging   DG Chest Port 1 View Result Date: 11/04/2023 CLINICAL DATA:  Status post aortic valve replacement. EXAM: PORTABLE CHEST 1 VIEW COMPARISON:  Chest radiograph dated 11/03/2023. FINDINGS: The patient is rotated. Similar positioning of the support apparatus. There is shallow inspiration and bibasilar atelectasis. Cardiomegaly with vascular congestion and probable mild edema. No pneumothorax. Median sternotomy wires and mechanical cardiac valve. No acute osseous pathology. IMPRESSION: Cardiomegaly with vascular congestion and mild edema. Electronically Signed   By: Angus Bark M.D.   On: 11/04/2023 09:17   DG CHEST PORT 1 VIEW Result Date: 11/03/2023 CLINICAL DATA:  Chest tube in place EXAM: PORTABLE CHEST 1 VIEW COMPARISON:  Radiograph 11/03/2023 FINDINGS: The position of the left chest tube has been adjusted and is now located more superiorly in the mid left chest. Stable position of the endotracheal tube with tip in the intrathoracic trachea, subdiaphragmatic enteric tube, mediastinal drains, and right IJ Swan-Ganz catheter. Sternotomy and AVR. Left atrial appendage occlusion device. Similar  patchy airspace opacities greatest in the left lower lung. Small left pleural effusion. Postoperative changes right mid lung. IMPRESSION: 1. The position of the left chest tube has been adjusted and is now located more superiorly in the mid left chest. No pneumothorax. 2. Stable position of the additional lines and tubes. 3. Similar patchy airspace opacities greatest in the left lower lung. Small left pleural effusion. Electronically Signed   By: Rozell Cornet M.D.   On: 11/03/2023 22:29   DG Chest Port 1 View Result Date: 11/03/2023 CLINICAL DATA:  Status post aortic valve replacement. EXAM: PORTABLE CHEST 1 VIEW COMPARISON:  Same day radiographs at 3:56 p.m. FINDINGS: Endotracheal tube tip terminates approximately 3 cm above the carina. Enteric tube courses below the left hemidiaphragm, beyond the field of view. Right IJ swans Ganz catheter tip overlies the pulmonary trunk. At least 2 mediastinal/pericardial drains are again  noted. Stable cardiomediastinal silhouette. Status post median sternotomy and aortic valve replacement. Left atrial appendage closure device in place. Small-to-moderate left pleural effusion again noted. Similar streaky opacities in the left upper to midlung zone. No pneumothorax identified. The visualized osseous structures are unchanged. IMPRESSION: 1. Life-support apparatus, as described above. 2. No significant change compared to the prior exam. Small to moderate left pleural effusion. Electronically Signed   By: Mannie Seek M.D.   On: 11/03/2023 18:28   DG Chest Port 1 View Result Date: 11/03/2023 CLINICAL DATA:  [id number] S/P aortic valve replacement with bioprosthetic valve [id number]. EXAM: PORTABLE CHEST 1 VIEW COMPARISON:  11/02/2023. FINDINGS: Linear areas of atelectasis/scarring noted overlying the bilateral mid lung zones. There is homogeneous opacity overlying the left upper lung zone and blunting the left lateral costophrenic angle, favoring small to moderate left pleural  effusion. Bilateral lung fields are clear. Bilateral costophrenic angles are clear. Normal cardio-mediastinal silhouette. Since the prior study, patient underwent median sternotomy with prosthetic aortic valve and left atrial appendage closure device. No acute osseous abnormalities. The soft tissues are within normal limits. *Endotracheal tube is seen with its tip approximately 3 cm above the carina. *Enteric tube is seen coursing below the left hemidiaphragm however, tip is not visualized. *Right IJ Swan-Ganz sheath noted. *Right IJ Swan-Ganz catheter tip overlies the main pulmonary outflow tract. *At least 2 mediastinal/pericardial drains noted. IMPRESSION: *Small to moderate left pleural effusion. Otherwise no acute cardiopulmonary abnormality. Support apparatus as detailed. Electronically Signed   By: Beula Brunswick M.D.   On: 11/03/2023 16:04     Medications:     Current Medications:  sodium chloride    Intravenous Once   acetaminophen   1,000 mg Oral Q6H   Or   acetaminophen  (TYLENOL ) oral liquid 160 mg/5 mL  1,000 mg Per Tube Q6H   acetaminophen  (TYLENOL ) oral liquid 160 mg/5 mL  650 mg Per Tube Once   arformoterol  15 mcg Nebulization BID   aspirin   324 mg Oral Once   aspirin  EC  325 mg Oral Daily   Or   aspirin   324 mg Per Tube Daily   atorvastatin   80 mg Oral QHS   bisacodyl   10 mg Oral Daily   Or   bisacodyl   10 mg Rectal Daily   buPROPion  ER  100 mg Oral BID   Chlorhexidine  Gluconate Cloth  6 each Topical Daily   docusate sodium   200 mg Oral Daily   DULoxetine   60 mg Oral BID   ipratropium-albuterol   3 mL Nebulization Q4H   metoCLOPramide (REGLAN) injection  10 mg Intravenous Q6H   metoprolol tartrate  12.5 mg Oral BID   Or   metoprolol tartrate  12.5 mg Per Tube BID   pantoprazole (PROTONIX) IV  40 mg Intravenous Q12H   revefenacin  175 mcg Nebulization Daily   sodium chloride  flush  3 mL Intravenous Q12H   sodium chloride  flush  3-10 mL Intravenous Q12H   sodium  chloride flush  3-10 mL Intravenous Q12H   sodium chloride  flush  3-10 mL Intravenous Q12H    Infusions:  sodium chloride  20 mL/hr at 11/04/23 0700   sodium chloride  250 mL (11/04/23 0804)   sodium chloride  10 mL/hr at 11/04/23 0700   albumin human 999 mL/hr at 11/04/23 0700   calcium  gluconate     dexmedetomidine (PRECEDEX) IV infusion Stopped (11/04/23 0649)   epinephrine  2.5 mcg/min (11/04/23 0700)   insulin  4 Units/hr (11/04/23 0854)   lactated ringers  20 mL/hr at  11/04/23 0700   niCARDipine     norepinephrine (LEVOPHED) Adult infusion Stopped (11/04/23 0346)   phenylephrine  (NEO-SYNEPHRINE) Adult infusion Stopped (11/03/23 1820)   potassium chloride  10 mEq (11/04/23 0842)   propofol  (DIPRIVAN ) infusion Stopped (11/04/23 0523)   vasopressin 0.04 Units/min (11/04/23 0802)      Patient Profile   Katelyn Cline is 63 year old with history of CAD, PAF 2018, COPD, PAD, HTN, DMII,metastatic melanoma, wedge resection RUL metastatic melanoma, fibromyalgia, and tobacco abuse.   11/03/23 S/P CABG AVR LA clipping  Assessment/Plan  1. S/P CABGx3  AVR LA clipping MAZE Intra-op TEE LVEF normal RV normal.  Returned to OR last night with bleeding. Now weaned off all drips except vaso. On statin.  Remains intubated. Possible extubation later today. She is chronically on 4-5 liters [place].  CO-OX 50%. Swan #s CVP 7-8  PA 42/18 (26)CO 4.6 CI 2.16 Give 40 mg IV lasix . Will need to start bid.  Repeat limited Echo.   2. PAF  S/P Cardioversion in March Maintaining SR.   3. DMII On SSI  4. PAD  Per Dr Katheryne Pane ABI- Severe SFA disease On Statin Will need follow up   5. Anemia, expected blood loss,post CABG  Given 2 PRBC Hgb Stable  Repeat CO-OX POCUS- Dr Mitzie Anda LVEF ~ 55% RV function normal.   Length of Stay: 10  Katelyn Bars, NP  11/04/2023, 9:22 AM    Advanced Heart Failure Team Pager [phone] (M-F; 7a - 5p)  Please contact [facility] for night-coverage after hours (4p -7a ) and  weekends on [email or web address]  Patient seen with NP, I formulated the plan and agree with the above note.   Patient with COPD and suspected OHS on 4-5 L at baseline is now s/p CABG-bioprosthetic AVR and Maze on 5/7.  Went back to OR last night for bleeding.  She has been weaned off all pressors but co-ox low at 51% with thermo CI 2.16.  She remains intubated.    Creatinine 0.95, stable.   I did an echo at bedside, LV appears hyperdynamic with EF 65-70%; RV mildly dilated with low normal systolic function.   General: NAD Neck: No JVD, no thyromegaly or thyroid  nodule.  Lungs: Clear to auscultation bilaterally with normal respiratory effort. CV: Nondisplaced PMI.  Heart regular S1/S2, no S3/S4, no murmur.  No peripheral edema.  No carotid bruit.  Normal pedal pulses.  Abdomen: Soft, nontender, no hepatosplenomegaly, no distention.  Skin: Intact without lesions or rashes.  Neurologic: Alert and oriented x 3.  Psych: Normal affect. Extremities: No clubbing or cyanosis.  HEENT: Normal.   1. Acute on chronic diastolic CHF: Post CABG-bioprosthetic AVR.  She has weaned off all inotropes/pressors but co-ox 51% now and CI 2.16.  CVP is around 10.  She is slow to wean from the vent. I did an echo at bedside, LV appears hyperdynamic with EF 65-70%; RV mildly dilated with low normal systolic function.   - With hyperdynamic LV and reasonable RV function, would not start milrinone.  - I will start Lasix  40 mg IV bid.  2. Acute hypoxemic respiratory failure: History of COPD and probable OHS with home oxygen  4-5 L/min.  She remains on the vent.  I suspect that she is going to be hard to wean.  - CCM following.  - Will diurese as above.  3. CAD: S/p CABG.  On ASA, statin.  4. Moderate AS: S/p bioprosthetic AVR with CABG.  5. Atrial fibrillation: H/o PAF, NSR currently.  -  Will need to resume anticoagulation eventually.   Baseline functional status is not good, uses home oxygen  and walks with walker.  Suspect  she will have long course in hospital and then need SNF.   CRITICAL CARE Performed by: Peder Bourdon  Total critical care time: 50 minutes  Critical care time was exclusive of separately billable procedures and treating other patients.  Critical care was necessary to treat or prevent imminent or life-threatening deterioration.  Critical care was time spent personally by me on the following activities: development of treatment plan with patient and/or surrogate as well as nursing, discussions with consultants, evaluation of patient's response to treatment, examination of patient, obtaining history from patient or surrogate, ordering and performing treatments and interventions, ordering and review of laboratory studies, ordering and review of radiographic studies, pulse oximetry and re-evaluation of patient's condition.  Peder Bourdon 11/04/2023 3:20 PM

## 2023-11-04 NOTE — Progress Notes (Signed)
 [address]       [place] [address]             [phone]                 1 Day Post-Op Procedure(s) (LRB): EXPLORATION POST OPERATIVE OPEN HEART; WASHOUT (N/A) ECHOCARDIOGRAM, TRANSESOPHAGEAL (N/A)   Events: stable _______________________________________________________________ Vitals: BP 127/61   Pulse 66   Temp 99.9 F (37.7 C)   Resp 18   Ht 5' 8.5" (1.74 m)   Wt 101.3 kg   SpO2 98%   BMI 33.46 kg/m  Filed Weights   10/25/23 0032 11/02/23 2232 11/04/23 0332  Weight: 93.9 kg 100.2 kg 101.3 kg     - Neuro: sedated  - Cardiovascular: sinus  Drips: epi 2.5, vaso 0.04.   PAP: (26-50)/(11-33) 43/20 CVP:  [1 mmHg-17 mmHg] 12 mmHg CO:  [2.8 L/min-5.6 L/min] 4.3 L/min CI:  [1.3 L/min/m2-2.6 L/min/m2] 1.99 L/min/m2  - Pulm:  Vent Mode: SIMV;PSV;PRVC FiO2 (%):  [50 %-60 %] 50 % Set Rate:  [16 bmp-20 bmp] 18 bmp Vt Set:  [520 mL] 520 mL PEEP:  [5 cmH20-8 cmH20] 8 cmH20 Pressure Support:  [10 cmH20] 10 cmH20 Plateau Pressure:  [22 cmH20-24 cmH20] 24 cmH20  ABG    Component Value Date/Time   PHART 7.443 11/03/2023 2306   PCO2ART 36.8 11/03/2023 2306   PO2ART 127 (H) 11/03/2023 2306   HCO3 25.0 11/03/2023 2306   TCO2 26 11/03/2023 2306   ACIDBASEDEF 9.0 (H) 11/03/2023 1829   O2SAT 50.6 11/04/2023 0503    - Abd: ND - Extremity: warm  .Intake/Output      05/07 0701 05/08 0700 05/08 0701 05/09 0700   P.O.     I.V. (mL/kg) 5613.2 (55.4)    Blood 1808    NG/GT 90    IV Piggyback 3434    Total Intake(mL/kg) 10945.2 (108)    Urine (mL/kg/hr) 1855 (0.8)    Emesis/NG output 400    Blood 1420    Chest Tube 1070    Total Output 4745    Net +6200.2            _______________________________________________________________ Labs:    Latest Ref Rng & Units 11/04/2023    5:00 AM 11/03/2023   11:06 PM 11/03/2023   10:59 PM  CBC  WBC 4.0 - 10.5 K/uL 15.8   18.9   Hemoglobin 12.0 - 15.0 g/dL 9.7  9.5  87.5   Hematocrit 36.0 - 46.0  % 29.0  28.0  30.9   Platelets 150 - 400 K/uL 199   190       Latest Ref Rng & Units 11/04/2023    5:00 AM 11/03/2023   11:06 PM 11/03/2023   10:59 PM  CMP  Glucose 70 - 99 mg/dL 643   329   BUN 8 - 23 mg/dL 28   24   Creatinine 5.18 - 1.00 mg/dL 8.41   6.60   Sodium 630 - 145 mmol/L 142  140  142   Potassium 3.5 - 5.1 mmol/L 3.7  3.9  3.9   Chloride 98 - 111 mmol/L 107   104   CO2 22 - 32 mmol/L 26   23   Calcium  8.9 - 10.3 mg/dL 9.3   9.5     CXR: PV congestion  _______________________________________________________________  Assessment and Plan: POD 1 s/p AVR, CABG, atriclip, resternotomy  Neuro: wean sedation CV: will keep wires, swan and A line today.  Wean epi and vaso as tolerated Pulm: wean to extubation.  On home O2 Renal: creat stable GI: will start diet once extubated Heme: stable ID: [email or web address] Endo: SSI Dispo: continue ICU care.     Hilarie Lovely 11/04/2023 8:22 AM

## 2023-11-05 ENCOUNTER — Other Ambulatory Visit: Payer: Self-pay | Admitting: Family Medicine

## 2023-11-05 DIAGNOSIS — G9341 Metabolic encephalopathy: Secondary | ICD-10-CM

## 2023-11-05 DIAGNOSIS — I5033 Acute on chronic diastolic (congestive) heart failure: Secondary | ICD-10-CM | POA: Diagnosis not present

## 2023-11-05 DIAGNOSIS — E785 Hyperlipidemia, unspecified: Secondary | ICD-10-CM | POA: Diagnosis not present

## 2023-11-05 DIAGNOSIS — J449 Chronic obstructive pulmonary disease, unspecified: Secondary | ICD-10-CM | POA: Diagnosis not present

## 2023-11-05 DIAGNOSIS — E1165 Type 2 diabetes mellitus with hyperglycemia: Secondary | ICD-10-CM

## 2023-11-05 DIAGNOSIS — F4310 Post-traumatic stress disorder, unspecified: Secondary | ICD-10-CM

## 2023-11-05 DIAGNOSIS — F17210 Nicotine dependence, cigarettes, uncomplicated: Secondary | ICD-10-CM

## 2023-11-05 DIAGNOSIS — I480 Paroxysmal atrial fibrillation: Secondary | ICD-10-CM | POA: Diagnosis not present

## 2023-11-05 DIAGNOSIS — I10 Essential (primary) hypertension: Secondary | ICD-10-CM | POA: Diagnosis not present

## 2023-11-05 DIAGNOSIS — J9611 Chronic respiratory failure with hypoxia: Secondary | ICD-10-CM | POA: Diagnosis not present

## 2023-11-05 LAB — BASIC METABOLIC PANEL WITH GFR
Anion gap: 8 (ref 5–15)
Anion gap: 9 (ref 5–15)
BUN: 42 mg/dL — ABNORMAL HIGH (ref 8–23)
BUN: 44 mg/dL — ABNORMAL HIGH (ref 8–23)
CO2: 29 mmol/L (ref 22–32)
CO2: 27 mmol/L (ref 22–32)
Calcium: 8.8 mg/dL — ABNORMAL LOW (ref 8.9–10.3)
Calcium: 8.8 mg/dL — ABNORMAL LOW (ref 8.9–10.3)
Chloride: 109 mmol/L (ref 98–111)
Chloride: 110 mmol/L (ref 98–111)
Creatinine, Ser: 1.1 mg/dL — ABNORMAL HIGH (ref 0.44–1.00)
Creatinine, Ser: 0.97 mg/dL (ref 0.44–1.00)
GFR, Estimated: 56 mL/min — ABNORMAL LOW (ref >60)
GFR, Estimated: >60 mL/min (ref >60)
Glucose, Bld: 113 mg/dL — ABNORMAL HIGH (ref 70–99)
Glucose, Bld: 107 mg/dL — ABNORMAL HIGH (ref 70–99)
Potassium: 3.4 mmol/L — ABNORMAL LOW (ref 3.5–5.1)
Potassium: 3.5 mmol/L (ref 3.5–5.1)
Sodium: 146 mmol/L — ABNORMAL HIGH (ref 135–145)
Sodium: 146 mmol/L — ABNORMAL HIGH (ref 135–145)

## 2023-11-05 LAB — CBC
HCT: 28.4 % — ABNORMAL LOW (ref 36.0–46.0)
Hemoglobin: 8.9 g/dL — ABNORMAL LOW (ref 12.0–15.0)
MCH: 29.3 pg (ref 26.0–34.0)
MCHC: 31.3 g/dL (ref 30.0–36.0)
MCV: 93.4 fL (ref 80.0–100.0)
Platelets: 218 10*3/uL (ref 150–400)
RBC: 3.04 MIL/uL — ABNORMAL LOW (ref 3.87–5.11)
RDW: 19.6 % — ABNORMAL HIGH (ref 11.5–15.5)
WBC: 21 10*3/uL — ABNORMAL HIGH (ref 4.0–10.5)
nRBC: 0.8 % — ABNORMAL HIGH (ref 0.0–0.2)

## 2023-11-05 LAB — GLUCOSE, CAPILLARY
Glucose-Capillary: 135 mg/dL — ABNORMAL HIGH (ref 70–99)
Glucose-Capillary: 126 mg/dL — ABNORMAL HIGH (ref 70–99)
Glucose-Capillary: 127 mg/dL — ABNORMAL HIGH (ref 70–99)
Glucose-Capillary: 128 mg/dL — ABNORMAL HIGH (ref 70–99)
Glucose-Capillary: 128 mg/dL — ABNORMAL HIGH (ref 70–99)
Glucose-Capillary: 131 mg/dL — ABNORMAL HIGH (ref 70–99)
Glucose-Capillary: 140 mg/dL — ABNORMAL HIGH (ref 70–99)
Glucose-Capillary: 146 mg/dL — ABNORMAL HIGH (ref 70–99)
Glucose-Capillary: 98 mg/dL (ref 70–99)
Glucose-Capillary: 129 mg/dL — ABNORMAL HIGH (ref 70–99)
Glucose-Capillary: 111 mg/dL — ABNORMAL HIGH (ref 70–99)
Glucose-Capillary: 115 mg/dL — ABNORMAL HIGH (ref 70–99)
Glucose-Capillary: 152 mg/dL — ABNORMAL HIGH (ref 70–99)
Glucose-Capillary: 116 mg/dL — ABNORMAL HIGH (ref 70–99)
Glucose-Capillary: 113 mg/dL — ABNORMAL HIGH (ref 70–99)
Glucose-Capillary: 145 mg/dL — ABNORMAL HIGH (ref 70–99)
Glucose-Capillary: 114 mg/dL — ABNORMAL HIGH (ref 70–99)
Glucose-Capillary: 78 mg/dL (ref 70–99)
Glucose-Capillary: 108 mg/dL — ABNORMAL HIGH (ref 70–99)

## 2023-11-05 LAB — COOXEMETRY PANEL
Carboxyhemoglobin: 1.3 % (ref 0.5–1.5)
Methemoglobin: <0.7 % (ref 0.0–1.5)
O2 Saturation: 68.7 %
Total hemoglobin: 8 g/dL — ABNORMAL LOW (ref 12.0–16.0)

## 2023-11-05 LAB — MAGNESIUM: Magnesium: 2.1 mg/dL (ref 1.7–2.4)

## 2023-11-05 LAB — AMMONIA: Ammonia: 36 umol/L — ABNORMAL HIGH (ref 9–35)

## 2023-11-05 MED ORDER — POTASSIUM CHLORIDE 10 MEQ/50ML IV SOLN
10.0000 meq | INTRAVENOUS | Status: AC
  Administered 2023-11-05 (×3): 10 meq via INTRAVENOUS
  Filled 2023-11-05 (×3): qty 50

## 2023-11-05 MED ORDER — FUROSEMIDE 10 MG/ML IJ SOLN
80.0000 mg | Freq: Two times a day (BID) | INTRAMUSCULAR | Status: DC
  Administered 2023-11-05 – 2023-11-07 (×5): 80 mg via INTRAVENOUS
  Filled 2023-11-05 (×6): qty 8

## 2023-11-05 MED ORDER — DOCUSATE SODIUM 50 MG/5ML PO LIQD
200.0000 mg | Freq: Every day | ORAL | Status: DC
Start: 2023-11-05 — End: 2023-11-11
  Administered 2023-11-05 – 2023-11-09 (×3): 200 mg
  Filled 2023-11-05 (×3): qty 20

## 2023-11-05 MED ORDER — DULOXETINE HCL 60 MG PO CPEP
60.0000 mg | ORAL_CAPSULE | Freq: Two times a day (BID) | ORAL | Status: DC
  Administered 2023-11-07 – 2023-11-25 (×36): 60 mg via ORAL
  Filled 2023-11-05 (×37): qty 1

## 2023-11-05 MED ORDER — BUPROPION HCL ER (SR) 100 MG PO TB12
100.0000 mg | ORAL_TABLET | Freq: Two times a day (BID) | ORAL | Status: DC
  Administered 2023-11-07 – 2023-11-25 (×36): 100 mg via ORAL
  Filled 2023-11-05 (×42): qty 1

## 2023-11-05 MED ORDER — POTASSIUM CHLORIDE 20 MEQ PO PACK
40.0000 meq | PACK | Freq: Four times a day (QID) | ORAL | Status: AC
  Administered 2023-11-05 (×2): 40 meq via ORAL
  Filled 2023-11-05 (×2): qty 2

## 2023-11-05 MED ORDER — FUROSEMIDE 10 MG/ML IJ SOLN: 60.0000 mg | Freq: Two times a day (BID) | INTRAMUSCULAR | Status: DC

## 2023-11-05 MED ORDER — POTASSIUM CHLORIDE 20 MEQ PO PACK
20.0000 meq | PACK | ORAL | Status: AC
  Administered 2023-11-05 – 2023-11-06 (×3): 20 meq
  Filled 2023-11-05 (×3): qty 1

## 2023-11-05 MED ORDER — POTASSIUM CHLORIDE 20 MEQ PO PACK: 40.0000 meq | PACK | Freq: Two times a day (BID) | ORAL | Status: DC

## 2023-11-05 MED ORDER — FUROSEMIDE 10 MG/ML IJ SOLN
40.0000 mg | Freq: Once | INTRAMUSCULAR | Status: AC
  Administered 2023-11-05: 40 mg via INTRAVENOUS
  Filled 2023-11-05: qty 4

## 2023-11-05 NOTE — TOC Progression Note (Signed)
 Transition of Care [facility]) - Progression Note    Patient Details  Name: Katelyn Cline MRN: [id number] Date of Birth: [birth year]  [facility] CM/SW Contact  Ernst Heap Phone Number: [phone] 11/05/2023, 11:51 AM  Clinical Narrative:   11:49 AM- HF CSW called and spoke with the patients son, Myrtie Atkinson to check-in. Patients sone stated that he was sitting with the patient at bedside. Patients son stated at this time he does not need anything or have any questions. Will continue to follow and monitor for dc needs.     Expected Discharge Plan:  (TBD post CABG) Barriers to Discharge: Continued Medical Work up  Expected Discharge Plan and Services                                               Social Determinants of Health (SDOH) Interventions SDOH Screenings   Food Insecurity: No Food Insecurity (10/25/2023)  Housing: Low Risk  (10/25/2023)  Transportation Needs: No Transportation Needs (10/25/2023)  Utilities: Not At Risk (10/25/2023)  Alcohol  Screen: Low Risk  (06/18/2020)  Depression (PHQ2-9): High Risk (07/16/2023)  Financial Resource Strain: Low Risk  (12/24/2022)  Physical Activity: Inactive (12/24/2022)  Social Connections: Socially Isolated (10/25/2023)  Stress: Stress Concern Present (12/24/2022)  Tobacco Use: High Risk (11/03/2023)    Readmission Risk Interventions     No data to display

## 2023-11-05 NOTE — Plan of Care (Signed)
  Problem: Metabolic: Goal: Ability to maintain appropriate glucose levels will improve Outcome: Progressing   Problem: Skin Integrity: Goal: Risk for impaired skin integrity will decrease Outcome: Progressing   Problem: Tissue Perfusion: Goal: Adequacy of tissue perfusion will improve Outcome: Progressing   Problem: Clinical Measurements: Goal: Will remain free from infection Outcome: Progressing

## 2023-11-05 NOTE — Progress Notes (Addendum)
 Advanced Heart Failure Rounding Note  Cardiologist: Lauro Portal, MD  Chief Complaint: Diastolic Heart Failure Subjective:    POD#2 s/p CABG + AVR + LAA clip  Off pressors. Weaning vent. Minimal UOP with Lasix  40 IV bid. Net negative 650cc. Weight up 3 lbs. Febrile overnight Tmax 100.6 on Tylenol . WBC 21. Abx per ccm  Lethargic, will follow commands and open her eyes to stimulation.   Objective:    Weight Range: 102.9 kg Body mass index is 33.99 kg/m.   Vital Signs:   Temp:  [89.1 F (31.7 C)-100.6 F (38.1 C)] 99.9 F (37.7 C) (05/09 1000) Pulse Rate:  [67-77] 68 (05/09 1000) Resp:  [0-29] 0 (05/09 1000) BP: (93-157)/(41-78) 149/53 (05/09 1000) SpO2:  [93 %-100 %] 94 % (05/09 1000) Arterial Line BP: (65-91)/(63-88) 65/63 (05/08 1145) FiO2 (%):  [40 %-50 %] 40 % (05/09 0835) Weight:  [102.9 kg] 102.9 kg (05/09 0500) Last BM Date : 11/22/23  Weight change: Filed Weights   11/02/23 2232 11/04/23 0332 11/05/23 0500  Weight: 100.2 kg 101.3 kg 102.9 kg   Intake/Output:  Intake/Output Summary (Last 24 hours) at 11/05/2023 1012 Last data filed at 11/05/2023 1000 Gross per 24 hour  Intake 987.11 ml  Output 1615 ml  Net -627.89 ml    Physical Exam    CVP 11 General: Lethargic Cardiac: JVP difficult to assess. S1 and S2 present. No murmurs or rub. Extremities: Warm and dry.  2+ ankle/foot edema. Generalized edema Neuro: Moving all extremities to command, lethargic. Needs repeated stimuli Lines/Devices:  R radial aline, ETT, OGT, foley, CT x3, RIJ PAC + introducer  Telemetry   SR in 60s (personally reviewed)  EKG    N/A  Labs    CBC Recent Labs    11/04/23 1604 11/05/23 0501  WBC 18.7* 21.0*  HGB 9.4* 8.9*  HCT 29.2* 28.4*  MCV 92.1 93.4  PLT 187 218   Basic Metabolic Panel Recent Labs    09/81/19 1604 11/05/23 0501  NA 145 146*  K 3.8 3.4*  CL 107 109  CO2 31 29  GLUCOSE 119* 113*  BUN 36* 42*  CREATININE 1.03* 1.10*  CALCIUM  9.0  8.8*  MG 2.2 2.1   BNP (last 3 results) Recent Labs    07/16/23 1454  BNP 173.5*    Imaging   No results found.  Medications:    Scheduled Medications:  sodium chloride    Intravenous Once   acetaminophen   1,000 mg Oral Q6H   Or   acetaminophen  (TYLENOL ) oral liquid 160 mg/5 mL  1,000 mg Per Tube Q6H   acetaminophen  (TYLENOL ) oral liquid 160 mg/5 mL  650 mg Per Tube Once   arformoterol   15 mcg Nebulization BID   aspirin   324 mg Oral Once   aspirin  EC  325 mg Oral Daily   Or   aspirin   324 mg Per Tube Daily   atorvastatin   80 mg Oral QHS   bisacodyl   10 mg Oral Daily   Or   bisacodyl   10 mg Rectal Daily   [START ON 11/06/2023] buPROPion  ER  100 mg Oral BID   Chlorhexidine  Gluconate Cloth  6 each Topical Daily   docusate  200 mg Per Tube Daily   [START ON 11/06/2023] DULoxetine   60 mg Oral BID   furosemide   60 mg Intravenous BID   ipratropium-albuterol   3 mL Nebulization Q4H   mouth rinse  15 mL Mouth Rinse Q2H   pantoprazole  (PROTONIX ) IV  40 mg  Intravenous Q12H   revefenacin   175 mcg Nebulization Daily   sodium chloride  flush  3 mL Intravenous Q12H   sodium chloride  flush  3-10 mL Intravenous Q12H   sodium chloride  flush  3-10 mL Intravenous Q12H   sodium chloride  flush  3-10 mL Intravenous Q12H   Infusions:  albumin  human 999 mL/hr at 11/04/23 1903   calcium  gluconate     epinephrine  Stopped (11/04/23 1037)   insulin  2.8 Units/hr (11/05/23 0700)   lactated ringers  20 mL/hr at 11/05/23 0700   norepinephrine  (LEVOPHED ) Adult infusion Stopped (11/04/23 2254)   potassium chloride  10 mEq (11/05/23 0929)   propofol  (DIPRIVAN ) infusion Stopped (11/04/23 0523)   vasopressin  Stopped (11/04/23 1106)   PRN Medications: albumin  human, dextrose , ipratropium-albuterol , metoprolol  tartrate, midazolam , morphine  injection, ondansetron  (ZOFRAN ) IV, mouth rinse, oxyCODONE , sodium chloride  flush, sodium chloride  flush, sodium chloride  flush, sodium chloride  flush,  traMADol   Patient Profile   Mrs Thurston is 63 year old with history of CAD, PAF 2018, COPD, PAD, HTN, DMII,metastatic melanoma, wedge resection RUL metastatic melanoma, fibromyalgia, and tobacco abuse. S/P CABG + AVR + LAA clip 5/7.  Assessment/Plan   1. Acute on chronic diastolic CHF: Post CABG-bioprosthetic AVR.  She is slow to wean from the vent. Dr. Mitzie Anda did echo at bedside yesterday, LV appears hyperdynamic with EF 65-70%; RV mildly dilated with low normal systolic function.   - With hyperdynamic LV and reasonable RV function, would not start milrinone . Co-ox 59%.  - Minimal diuresis yesterday. CVP 11. Increase Lasix  to 80 mg IV bid.  2. Acute hypoxemic respiratory failure: History of COPD and probable OHS with home oxygen  4-5 L/min.  She remains on the vent. Now on pressure support. Still too sleepy. Off all sedation for >24h. - CCM following.  - Will diurese as above.  3. CAD: S/p CABG.  On ASA, statin.  4. Moderate AS: S/p bioprosthetic AVR with CABG.  5. Atrial fibrillation: H/o PAF, NSR currently.  - Will need to resume anticoagulation eventually. 6. Leukocytosis: WBC up to 21 today. Febrile overnight with Tmax 100.60F on tylenol .  - Levofloxacin Kemper Patterson per CCM.   Length of Stay: 65  [place] Lee, NP  11/05/2023, 10:12 AM  Advanced Heart Failure Team Pager [phone] (M-F; 7a - 5p)  Please contact [facility] for night-coverage after hours (5p -7a ) and weekends on [email or web address]  Patient seen with NP, I formulated the plan and agree with the above note.   Patient is off pressors today.  CVP is about 9 on my read.   Tm 100.4, she is on empiric vancomycin /levofloxacin .   General: Intubated Neck: JVP 8-9 cm, no thyromegaly or thyroid  nodule.  Lungs: Clear to auscultation bilaterally with normal respiratory effort. CV: Nondisplaced PMI.  Heart regular S1/S2, no S3/S4, no murmur.  No peripheral edema.    Abdomen: Soft, nontender, no hepatosplenomegaly, no distention.  Skin:  Intact without lesions or rashes.  Neurologic: Awake on vent following commands. Extremities: No clubbing or cyanosis.  HEENT: Normal.   Echo at bedside yesterday showed hyperdynamic LV and low normal RV function. She is off all pressors.  Mild volume overload.  - Agree with Lasix  80 mg IV bid today.   Vent wean complicated by baseline COPD and probable OHS with home oxygen . CCM following.   Febrile and on empiric abx as above.   CRITICAL CARE Performed by: Peder Bourdon  Total critical care time: 35 minutes  Critical care time was exclusive of separately billable procedures and treating other patients.  Critical care was necessary to treat or prevent imminent or life-threatening deterioration.  Critical care was time spent personally by me on the following activities: development of treatment plan with patient and/or surrogate as well as nursing, discussions with consultants, evaluation of patient's response to treatment, examination of patient, obtaining history from patient or surrogate, ordering and performing treatments and interventions, ordering and review of laboratory studies, ordering and review of radiographic studies, pulse oximetry and re-evaluation of patient's condition.  Peder Bourdon 11/05/2023 11:48 AM

## 2023-11-05 NOTE — Progress Notes (Signed)
      [address]       [place] [address]             [phone]      POD # 2 avr cabg  Intubated  BP (!) 130/51   Pulse 72   Temp 99.7 F (37.6 C)   Resp 20   Ht 5' 8.5" (1.74 m)   Wt 102.9 kg   SpO2 91%   BMI 33.99 kg/m   IMV 18/40%/5 PEEP   Intake/Output Summary (Last 24 hours) at 11/05/2023 2042 Last data filed at 11/05/2023 1840 Gross per 24 hour  Intake 995.06 ml  Output 2095 ml  Net -1099.94 ml   Insulin  at 1.7 K 3.5, creatinine 0.97 Diurese, replete K   Katelyn Cline C. Luna Salinas, MD [facility] [phone]

## 2023-11-05 NOTE — Plan of Care (Signed)
  Problem: Metabolic: Goal: Ability to maintain appropriate glucose levels will improve Outcome: Progressing   Problem: Tissue Perfusion: Goal: Adequacy of tissue perfusion will improve Outcome: Progressing   Problem: Cardiac: Goal: Ability to achieve and maintain adequate cardiovascular perfusion will improve Outcome: Progressing

## 2023-11-05 NOTE — Progress Notes (Addendum)
 [facility] DAILY ICU PROGRESS NOTE                   [address]            [name] [address]          [phone]   2 Days Post-Op Procedure(s) (LRB): EXPLORATION POST OPERATIVE OPEN HEART; WASHOUT (N/A) ECHOCARDIOGRAM, TRANSESOPHAGEAL (N/A)  Total Length of Stay:  LOS: 11 days   Subjective: Remains intubated and mechanically ventilated. Sedation off since yesterday morning.  Opens eyes to voice and moves all extremities spontaneously.  Not requiring any inotropic support.  Norepinephrine  off since 10 PM last night.  Objective: Vital signs in last 24 hours: Temp:  [89.1 F (31.7 C)-100.6 F (38.1 C)] 99.9 F (37.7 C) (05/09 1000) Pulse Rate:  [67-77] 68 (05/09 1000) Cardiac Rhythm: Normal sinus rhythm (05/08 2000) Resp:  [0-29] 0 (05/09 1000) BP: (93-157)/(41-78) 149/53 (05/09 1000) SpO2:  [93 %-100 %] 94 % (05/09 1000) Arterial Line BP: (65-91)/(63-88) 65/63 (05/08 1145) FiO2 (%):  [40 %-50 %] 40 % (05/09 0835) Weight:  [102.9 kg] 102.9 kg (05/09 0500)  Filed Weights   11/02/23 2232 11/04/23 0332 11/05/23 0500  Weight: 100.2 kg 101.3 kg 102.9 kg    Weight change: 1.6 kg   Hemodynamic parameters for last 24 hours: PAP: (40-55)/(17-28) 47/24 CVP:  [0 mmHg-58 mmHg] 0 mmHg CO:  [6.3 L/min] 6.3 L/min CI:  [2.9 L/min/m2] 2.9 L/min/m2  Intake/Output from previous day: 05/08 0701 - 05/09 0700 In: 870.1 [I.V.:770; IV Piggyback:100.1] Out: 1520 [Urine:1070; Chest Tube:450]  Intake/Output this shift: Total I/O In: 60 [NG/GT:60] Out: 200 [Urine:100; Chest Tube:100]  Current Meds: Scheduled Meds:  sodium chloride    Intravenous Once   acetaminophen   1,000 mg Oral Q6H   Or   acetaminophen  (TYLENOL ) oral liquid 160 mg/5 mL  1,000 mg Per Tube Q6H   acetaminophen  (TYLENOL ) oral liquid 160 mg/5 mL  650 mg Per Tube Once   arformoterol   15 mcg Nebulization BID   aspirin   324 mg Oral Once   aspirin  EC  325 mg Oral Daily   Or   aspirin   324 mg Per Tube  Daily   atorvastatin   80 mg Oral QHS   bisacodyl   10 mg Oral Daily   Or   bisacodyl   10 mg Rectal Daily   [START ON 11/06/2023] buPROPion  ER  100 mg Oral BID   Chlorhexidine  Gluconate Cloth  6 each Topical Daily   docusate sodium   200 mg Oral Daily   [START ON 11/06/2023] DULoxetine   60 mg Oral BID   furosemide   60 mg Intravenous BID   ipratropium-albuterol   3 mL Nebulization Q4H   mouth rinse  15 mL Mouth Rinse Q2H   pantoprazole  (PROTONIX ) IV  40 mg Intravenous Q12H   revefenacin   175 mcg Nebulization Daily   sodium chloride  flush  3 mL Intravenous Q12H   sodium chloride  flush  3-10 mL Intravenous Q12H   sodium chloride  flush  3-10 mL Intravenous Q12H   sodium chloride  flush  3-10 mL Intravenous Q12H   Continuous Infusions:  albumin  human 999 mL/hr at 11/04/23 1903   calcium  gluconate     epinephrine  Stopped (11/04/23 1037)   insulin  2.8 Units/hr (11/05/23 0700)   lactated ringers  20 mL/hr at 11/05/23 0700   norepinephrine  (LEVOPHED ) Adult infusion Stopped (11/04/23 2254)   potassium chloride  10 mEq (11/05/23 0929)   propofol  (DIPRIVAN ) infusion Stopped (11/04/23 0523)   vasopressin  Stopped (11/04/23  1106)   PRN Meds:.albumin  human, dextrose , ipratropium-albuterol , metoprolol  tartrate, midazolam , morphine  injection, ondansetron  (ZOFRAN ) IV, mouth rinse, oxyCODONE , sodium chloride  flush, sodium chloride  flush, sodium chloride  flush, sodium chloride  flush, traMADol   General appearance: Mechanically ventilated, skin warm and dry Neurologic: Opens eyes to voice, moves extremities spontaneously Heart: Normal sinus rhythm with a heart rate in the mid 70s.  No significant arrhythmias past 24 hours Lungs: Breath sounds are clear anterior.  Chest x-ray showing stable cardiomegaly and vascular congestion, probable mild pulmonary edema.  ET tube in good position.  Thin, serosanguineous chest tube drainage totaling 450 mL past 24 hours Abdomen: Soft, no apparent tenderness.  Active bowel  sounds Extremities: Cool, pink, brisk capillary refill.  2+ edema bilateral ankles and feet Wound: Incisions are covered with dry dressings  Lab Results: CBC: Recent Labs    11/04/23 1604 11/05/23 0501  WBC 18.7* 21.0*  HGB 9.4* 8.9*  HCT 29.2* 28.4*  PLT 187 218   BMET:  Recent Labs    11/04/23 1604 11/05/23 0501  NA 145 146*  K 3.8 3.4*  CL 107 109  CO2 31 29  GLUCOSE 119* 113*  BUN 36* 42*  CREATININE 1.03* 1.10*  CALCIUM  9.0 8.8*    CMET: Lab Results  Component Value Date   WBC 21.0 (H) 11/05/2023   HGB 8.9 (L) 11/05/2023   HCT 28.4 (L) 11/05/2023   PLT 218 11/05/2023   GLUCOSE 113 (H) 11/05/2023   CHOL 115 09/08/2023   TRIG 108 09/08/2023   HDL 38 (L) 09/08/2023   LDLDIRECT 116.0 11/17/2022   LDLCALC 57 09/08/2023   ALT 16 10/28/2023   AST 14 (L) 10/28/2023   NA 146 (H) 11/05/2023   K 3.4 (L) 11/05/2023   CL 109 11/05/2023   CREATININE 1.10 (H) 11/05/2023   BUN 42 (H) 11/05/2023   CO2 29 11/05/2023   TSH 2.51 11/17/2022   INR 2.1 (H) 11/03/2023   HGBA1C 5.1 10/28/2023   MICROALBUR 8.0 (H) 11/17/2022      PT/INR:  Recent Labs    11/03/23 2259  LABPROT 23.6*  INR 2.1*   Radiology: No results found.   Assessment/Plan: S/P Procedure(s) (LRB): EXPLORATION POST OPERATIVE OPEN HEART; WASHOUT (N/A) ECHOCARDIOGRAM, TRANSESOPHAGEAL (N/A)  - Postop day 2 CABG and bioprosthetic aortic valve replacement and subsequent takeback to the OR for washout of the left pleural space for postop bleeding after presenting with unstable angina on 10/25/2023.  Preop EF 55 to 60%.  Vital signs and hemodynamics stable off vasopressor and inotropic support.  On aspirin , low-dose metoprolol , and atorvastatin .  -Pulm: Heavy smoker prior to admission with COPD, on home O2 therapy at 3 L/min.  Intubated and currently on CPAP, FiO2 0.4, PEEP of 7.  Vent management per critical care medicine.  Anticipate extubation soon.  -Heme: Expected acute blood loss anemia.   Transfused with PRBCs and FFP early postoperatively for acute blood loss and coagulopathy.  Hematocrit relatively stable and well-tolerated.  History of polycythemia vera, monitoring  -Renal: Normal renal function at baseline, creatinine 1.1.  Weight approximately 9 kg above preop if accurate.  Lasix  increased to 80 mg twice daily.  - Endo: Type 2 diabetes mellitus.  Currently managed with insulin  drip with good control.  Transition to Levemir  and sliding scale after extubated.  - History of anxiety/depression:  Cymbalta  and Wellbutrin  resumed.  -DVT prophylaxis: SCDs, not on enoxaparin     Myron G. Roddenberry, PA-C 11/05/2023 10:08 AM   Agree Slow to wake up, but non-focal  on exam Wean to extubate Off pressor and inotropes Will keep wires, and tubes for now  [employer]

## 2023-11-05 NOTE — Anesthesia Postprocedure Evaluation (Signed)
 Anesthesia Post Note  Patient: Katelyn Cline  Procedure(s) Performed: EXPLORATION POST OPERATIVE OPEN HEART; WASHOUT (Chest) ECHOCARDIOGRAM, TRANSESOPHAGEAL     Patient location during evaluation: SICU Anesthesia Type: General Level of consciousness: sedated Pain management: pain level controlled Vital Signs Assessment: post-procedure vital signs reviewed and stable Respiratory status: patient remains intubated per anesthesia plan Cardiovascular status: stable Postop Assessment: no apparent nausea or vomiting Anesthetic complications: no   There were no known notable events for this encounter.                  Tulio Facundo

## 2023-11-05 NOTE — Progress Notes (Signed)
 NAME:  Katelyn Cline, MRN:  [id number], DOB:  07/06/1960, LOS: 11 ADMISSION DATE:  10/25/2023, CONSULTATION DATE:  5/7 REFERRING MD:  Dr. Deloise Ferries, CHIEF COMPLAINT:  CABG x 3; AVR; [place]  History of Present Illness:  Patient is a 63 yo F w/ pertinent PMH of 3v CAD, moderate aortic stenosis, Afib on apixaban , COPD on 4L [place], 100 pack year history, DMT2, HTN, HLD, PAD presents to [facility] ED on 4/28 w/ unstable angina.  Patient followed by cardiology outpt. Coronary calcium  score on 06/2023 1238 in all 3 coronary arteries. Echo w/ normal systolic function and moderate aortic stenosis. 08/2023 cardaic PET was high risk. Cardiac cath showing 3 vessel disease. Plan to see TCTS for potential CABG/AVR.  On 4/28 having chest pain radiating down to left arm and jaw. Came to [facility] ed. Cards admitted for unstable angina. Started on heparin  iv and asa. Given sublingual nitroglycerin . Trop mildly elevated. Also found to have anemia and fecal occult positive. Started on PPI. GI consulted recommending continuing PPI BID and hold on EGD for now.   Pertinent  Medical History   Past Medical History:  Diagnosis Date   Agoraphobia    Anxiety    Arthritis    "all joints"   Chronic low back pain    Chronic respiratory failure with hypoxia ([name])    4L  via [place],  followed by pcp,   (09-16-2018  per pt only uses while at home and at night ,  portable oxygen  is not working)   Clotting disorder ([name]) 2013   Polycythemia vera   Colon polyps    COPD (chronic obstructive pulmonary disease) ([name])    DDD (degenerative disc disease), lumbosacral    Dental caries    DM type 2 (diabetes mellitus, type 2) ([name])    followed by pcp   Fibromyalgia    GERD (gastroesophageal reflux disease)    occasional , no meds   Heart murmur 2020   History of basal cell carcinoma (BCC) excision    s/p  Moh's of face/ nose 12/ 2013   History of palpitations    per pt found to have occaional PVCs   History of UTI    HTN (hypertension)     Hyperlipidemia    Loose, teeth    Major depression    Metastatic melanoma ([name]) 10/18/2012   12 mm posterior right upper lobe pulmonary nodule, max SUV 3.0  s/p  right wedge resection 10-21-2012,  followed by oncologist-- dr Salomon Cree,  no recurrence   Mixed stress and urge urinary incontinence    Myeloproliferative neoplasm ([name])    Narcolepsy    Obesity    OSA (obstructive sleep apnea)    Oxygen  deficiency    Oxygen  dependent    4 L via [place],  per pt portable oxygen  not working but does use while at home and at night   Panic attacks    Periodontitis    Peripheral neuropathy    feet   Polycythemia vera(238.4) hematology/ oncology-- dr Salomon Cree ([facility])   first dx 02014 due to smoker/copd--  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)  hx phlebotomies   Pulmonary nodules    bilateral small stable per CT 03-11-2018   Scoliosis    Sleep apnea    Smokers' cough ([name])    per pt productive a little in the morning's   Symptoms of upper respiratory infection (URI) 03/12/2018   Urinary (tract) obstruction    Wears glasses  Significant Hospital Events: Including procedures, antibiotic start and stop dates in addition to other pertinent events   4/28 admitted w/ unstable angina; possible GIB 5/7 CABG x 3 w/ AVR; having blood loss requiring multiple PRBCs and FFP; taken back to OR overnight with no source of bleeding; left hemothorax evacuated  Interim History / Subjective:  Overnight no acute events. Remains off sedation. Tmax 100.6.  Objective   Blood pressure 114/78, pulse 68, temperature (!) 100.4 F (38 C), resp. rate 18, height 5' 8.5" (1.74 m), weight 102.9 kg, SpO2 95%. PAP: (40-63)/(17-28) 47/24 CVP:  [0 mmHg-58 mmHg] 0 mmHg CO:  [4.6 L/min-6.3 L/min] 6.3 L/min CI:  [2.2 L/min/m2-2.9 L/min/m2] 2.9 L/min/m2  Vent Mode: SIMV;PRVC;PSV FiO2 (%):  [50 %] 50 % Set Rate:  [18 bmp] 18 bmp Vt Set:  [520 mL] 520 mL PEEP:  [8 cmH20] 8 cmH20 Pressure  Support:  [8 cmH20-13 cmH20] 13 cmH20 Plateau Pressure:  [23 cmH20-24 cmH20] 23 cmH20   Intake/Output Summary (Last 24 hours) at 11/05/2023 0748 Last data filed at 11/05/2023 0700 Gross per 24 hour  Intake 870.11 ml  Output 1520 ml  Net -649.89 ml   Filed Weights   11/02/23 2232 11/04/23 0332 11/05/23 0500  Weight: 100.2 kg 101.3 kg 102.9 kg    Examination: General: critically ill appearing woman lying in bed in NAD, intubated, not sedated. HEENT: [place]/AT, eyes anicteric, ETT in place Neuro: RASS -1, moving all extremities but not always following commands.  CV: S1S2, RRR, sternal dressing intact. Mild bloody output from drains.  PULM:  breathing comfortably on MV, no rhonchi or wheezing GI: obese, soft, NT Extremities: + edema  [place] 69%  Na+  146 K+ 3.4 BUN 42 Cr 1.1 WBC 21 H/H 8.9/28.4 Platelets 218 CXR personally reviewed> ETT about 5 cm above carina, increased bibasilar opacities.   Resolved Hospital Problem list     Assessment & Plan:  Postoperative vent management Chronic respiratory failure on 3L home oxygen  for COPD> not acutely exacerbated -keep off sedation; weaning this morning. Initially had apneas, now tolerating decreasing amount of pressure support -lasix  -VAP prevention protocol -pulmonary hygiene -bronchodilators - yupelri  & brovana ; not on PTA bronchodilators per med list review  S/p 3-vessel CABG and bioprosthetic AVR, atrial clip, MAZE Moderate aortic stenosis CAD HTN HLD -post-op care per TCTS -con't to monitor chest tube output -aspirin , statin -start low dose metoprolol  -pain control per protocol  Acute metabolic encephalopathy -check ammonia level  Hyperglycemia; h/o DM. A1c 5.1 -transition off insulin  gtt to basal bolus -goal BG 140-180  Tobacco abuse -cessation counseling when appropriate -consider nicotine  patch if needed   Post-op ABLA, expected H/o polycythemia vera H/o Anemia due to GIB earlier this admission> did not  undergo invasive evaluation due to cardiac instability -GI consulted; recommend PPI BID and hold on EGD for now -holding hydroxyurea  -heparin  on hold   PAD with claudication -aspirin , statin  Chronic Pain Peripheral Neuropathy Depression -can resume PTA wellbutrin  when able to take PO meds   Hypokalemia-repleted -monitor   Best Practice (right click and "Reselect all SmartList Selections" daily)   Diet/type: NPO w/ meds via tube DVT prophylaxis: SCD GI prophylaxis: PPI Lines: Central line and Arterial Line Foley:  Yes, and it is still needed Code Status:  full code Last date of multidisciplinary goals of care discussion [per primary]  Labs   CBC: Recent Labs  Lab 11/03/23 1530 11/03/23 1536 11/03/23 2259 11/03/23 2306 11/04/23 0500 11/04/23 1202 11/04/23 1604 11/05/23 0501  WBC 36.3*  --  18.9*  --  15.8*  --  18.7* 21.0*  HGB 6.3*   < > 10.4* 9.5* 9.7* 9.5* 9.4* 8.9*  HCT 22.9*   < > 30.9* 28.0* 29.0* 28.0* 29.2* 28.4*  MCV 99.6  --  88.8  --  88.7  --  92.1 93.4  PLT 418*  --  190  --  199  --  187 218   < > = values in this interval not displayed.    Basic Metabolic Panel: Recent Labs  Lab 11/03/23 0441 11/03/23 0905 11/03/23 1417 11/03/23 1420 11/03/23 2259 11/03/23 2306 11/04/23 0500 11/04/23 1202 11/04/23 1604 11/05/23 0501  NA 141   < > 141   < > 142 140 142 143 145 146*  K 4.3   < > 4.0   < > 3.9 3.9 3.7 4.0 3.8 3.4*  CL 102   < > 104  --  104  --  107  --  107 109  CO2 32  --   --   --  23  --  26  --  31 29  GLUCOSE 135*   < > 123*  --  191*  --  135*  --  119* 113*  BUN 27*   < > 29*  --  24*  --  28*  --  36* 42*  CREATININE 0.76   < > 0.70  --  0.85  --  0.95  --  1.03* 1.10*  CALCIUM  9.5  --   --   --  9.5  --  9.3  --  9.0 8.8*  MG  --   --   --   --  2.4  --  2.2  --  2.2 2.1   < > = values in this interval not displayed.   GFR: Estimated Creatinine Clearance: 64.6 mL/min (A) (by C-G formula based on SCr of 1.1 mg/dL (H)). Recent  Labs  Lab 11/03/23 2259 11/04/23 0500 11/04/23 1604 11/05/23 0501  WBC 18.9* 15.8* 18.7* 21.0*   ABG    Component Value Date/Time   PHART 7.367 11/04/2023 1202   PCO2ART 49.5 (H) 11/04/2023 1202   PO2ART 89 11/04/2023 1202   HCO3 28.4 (H) 11/04/2023 1202   TCO2 30 11/04/2023 1202   ACIDBASEDEF 2.0 11/03/2023 1947   O2SAT 68.7 11/05/2023 0501     Coagulation Profile: Recent Labs  Lab 11/03/23 1530 11/03/23 2259  INR 1.5* 2.1*    HbA1C: Hgb A1c MFr Bld  Date/Time Value Ref Range Status  10/28/2023 03:59 AM 5.1 4.8 - 5.6 % Final    Comment:    (NOTE) Pre diabetes:          5.7%-6.4%  Diabetes:              >6.4%  Glycemic control for   <7.0% adults with diabetes   11/17/2022 10:30 AM 5.8 4.6 - 6.5 % Final    Comment:    Glycemic Control Guidelines for People with Diabetes:Non Diabetic:  <6%Goal of Therapy: <7%Additional Action Suggested:  >8%     CBG: Recent Labs  Lab 11/05/23 0010 11/05/23 0118 11/05/23 0206 11/05/23 0404 11/05/23 0612  GLUCAP 127* 128* 146* 140* 135*    Critical care time:      This patient is critically ill with multiple organ system failure which requires frequent high complexity decision making, assessment, support, evaluation, and titration of therapies. This was completed through the application of advanced monitoring technologies and extensive  interpretation of multiple databases. During this encounter critical care time was devoted to patient care services described in this note for 40 minutes.  Joesph Mussel, DO 11/05/23 9:37 AM [facility]  For contact information, see Amion. If no response to pager, please call PCCM consult pager. After hours, 7PM- 7AM, please call Elink.

## 2023-11-06 DIAGNOSIS — I10 Essential (primary) hypertension: Secondary | ICD-10-CM | POA: Diagnosis not present

## 2023-11-06 DIAGNOSIS — J9611 Chronic respiratory failure with hypoxia: Secondary | ICD-10-CM | POA: Diagnosis not present

## 2023-11-06 DIAGNOSIS — E785 Hyperlipidemia, unspecified: Secondary | ICD-10-CM | POA: Diagnosis not present

## 2023-11-06 DIAGNOSIS — J449 Chronic obstructive pulmonary disease, unspecified: Secondary | ICD-10-CM | POA: Diagnosis not present

## 2023-11-06 DIAGNOSIS — I480 Paroxysmal atrial fibrillation: Secondary | ICD-10-CM | POA: Diagnosis not present

## 2023-11-06 DIAGNOSIS — I5033 Acute on chronic diastolic (congestive) heart failure: Secondary | ICD-10-CM | POA: Diagnosis not present

## 2023-11-06 DIAGNOSIS — E7220 Disorder of urea cycle metabolism, unspecified: Secondary | ICD-10-CM

## 2023-11-06 LAB — TYPE AND SCREEN
ABO/RH(D): O NEG
Antibody Screen: NEGATIVE
Unit division: 0
Unit division: 0
Unit division: 0
Unit division: 0
Unit division: 0
Unit division: 0
Unit division: 0
Unit division: 0
Unit division: 0
Unit division: 0

## 2023-11-06 LAB — BPAM RBC
Blood Product Expiration Date: 202506022359
Blood Product Expiration Date: 202506052359
Blood Product Expiration Date: 202506052359
Blood Product Expiration Date: 202506052359
Blood Product Expiration Date: 202506052359
Blood Product Expiration Date: 202506052359
Blood Product Expiration Date: 202506062359
Blood Product Expiration Date: 202506062359
Blood Product Expiration Date: 202506062359
Blood Product Expiration Date: 202506062359
ISSUE DATE / TIME: 202505071554
ISSUE DATE / TIME: 202505071554
ISSUE DATE / TIME: 202505071833
ISSUE DATE / TIME: 202505071833
ISSUE DATE / TIME: 202505071905
ISSUE DATE / TIME: 202505071905
ISSUE DATE / TIME: 202505071949
ISSUE DATE / TIME: 202505071949
Unit Type and Rh: 5100
Unit Type and Rh: 5100
Unit Type and Rh: 5100
Unit Type and Rh: 5100
Unit Type and Rh: 5100
Unit Type and Rh: 5100
Unit Type and Rh: 5100
Unit Type and Rh: 5100
Unit Type and Rh: 5100
Unit Type and Rh: 5100

## 2023-11-06 LAB — CBC
HCT: 28.9 % — ABNORMAL LOW (ref 36.0–46.0)
Hemoglobin: 8.7 g/dL — ABNORMAL LOW (ref 12.0–15.0)
MCH: 29.1 pg (ref 26.0–34.0)
MCHC: 30.1 g/dL (ref 30.0–36.0)
MCV: 96.7 fL (ref 80.0–100.0)
Platelets: 262 10*3/uL (ref 150–400)
RBC: 2.99 MIL/uL — ABNORMAL LOW (ref 3.87–5.11)
RDW: 19.9 % — ABNORMAL HIGH (ref 11.5–15.5)
WBC: 24.4 10*3/uL — ABNORMAL HIGH (ref 4.0–10.5)
nRBC: 0.6 % — ABNORMAL HIGH (ref 0.0–0.2)

## 2023-11-06 LAB — COOXEMETRY PANEL
Carboxyhemoglobin: 2.8 % — ABNORMAL HIGH (ref 0.5–1.5)
Methemoglobin: 0.9 % (ref 0.0–1.5)
O2 Saturation: 70.8 %
Total hemoglobin: 10.9 g/dL — ABNORMAL LOW (ref 12.0–16.0)

## 2023-11-06 LAB — GLUCOSE, CAPILLARY
Glucose-Capillary: 116 mg/dL — ABNORMAL HIGH (ref 70–99)
Glucose-Capillary: 119 mg/dL — ABNORMAL HIGH (ref 70–99)
Glucose-Capillary: 127 mg/dL — ABNORMAL HIGH (ref 70–99)
Glucose-Capillary: 128 mg/dL — ABNORMAL HIGH (ref 70–99)
Glucose-Capillary: 131 mg/dL — ABNORMAL HIGH (ref 70–99)
Glucose-Capillary: 134 mg/dL — ABNORMAL HIGH (ref 70–99)
Glucose-Capillary: 139 mg/dL — ABNORMAL HIGH (ref 70–99)
Glucose-Capillary: 141 mg/dL — ABNORMAL HIGH (ref 70–99)

## 2023-11-06 LAB — BASIC METABOLIC PANEL WITH GFR
Anion gap: 7 (ref 5–15)
BUN: 43 mg/dL — ABNORMAL HIGH (ref 8–23)
CO2: 29 mmol/L (ref 22–32)
Calcium: 8.9 mg/dL (ref 8.9–10.3)
Chloride: 114 mmol/L — ABNORMAL HIGH (ref 98–111)
Creatinine, Ser: 0.84 mg/dL (ref 0.44–1.00)
GFR, Estimated: >60 mL/min (ref >60)
Glucose, Bld: 142 mg/dL — ABNORMAL HIGH (ref 70–99)
Potassium: 3.9 mmol/L (ref 3.5–5.1)
Sodium: 150 mmol/L — ABNORMAL HIGH (ref 135–145)

## 2023-11-06 LAB — MRSA NEXT GEN BY PCR, NASAL: MRSA by PCR Next Gen: NOT DETECTED

## 2023-11-06 MED ORDER — LACTULOSE 10 GM/15ML PO SOLN
10.0000 g | Freq: Four times a day (QID) | ORAL | Status: AC
Start: 2023-11-06 — End: 2023-11-07
  Administered 2023-11-06 – 2023-11-07 (×3): 10 g
  Filled 2023-11-06 (×3): qty 15

## 2023-11-06 MED ORDER — INSULIN ASPART 100 UNIT/ML IJ SOLN
0.0000 [IU] | INTRAMUSCULAR | Status: DC
  Administered 2023-11-06 – 2023-11-08 (×8): 3 [IU] via SUBCUTANEOUS

## 2023-11-06 MED ORDER — INSULIN GLARGINE-YFGN 100 UNIT/ML ~~LOC~~ SOLN
25.0000 [IU] | Freq: Two times a day (BID) | SUBCUTANEOUS | Status: DC
  Administered 2023-11-06 – 2023-11-08 (×6): 25 [IU] via SUBCUTANEOUS
  Filled 2023-11-06 (×8): qty 0.25

## 2023-11-06 MED ORDER — SODIUM CHLORIDE 0.9 % IV SOLN
2.0000 g | Freq: Three times a day (TID) | INTRAVENOUS | Status: DC
  Administered 2023-11-06 – 2023-11-10 (×14): 2 g via INTRAVENOUS
  Filled 2023-11-06 (×15): qty 12.5

## 2023-11-06 MED ORDER — FREE WATER
200.0000 mL | Freq: Three times a day (TID) | Status: DC
  Administered 2023-11-06 – 2023-11-07 (×4): 200 mL

## 2023-11-06 MED ORDER — SODIUM CHLORIDE 3 % IN NEBU
4.0000 mL | INHALATION_SOLUTION | RESPIRATORY_TRACT | Status: DC
  Administered 2023-11-06 – 2023-11-08 (×11): 4 mL via RESPIRATORY_TRACT
  Filled 2023-11-06 (×16): qty 4

## 2023-11-06 NOTE — Plan of Care (Signed)
  Problem: Skin Integrity: Goal: Risk for impaired skin integrity will decrease Outcome: Progressing   Problem: Tissue Perfusion: Goal: Adequacy of tissue perfusion will improve Outcome: Progressing   Problem: Activity: Goal: Ability to tolerate increased activity will improve Outcome: Progressing   Problem: Clinical Measurements: Goal: Ability to maintain clinical measurements within normal limits will improve Outcome: Progressing Goal: Cardiovascular complication will be avoided Outcome: Progressing

## 2023-11-06 NOTE — Plan of Care (Signed)
  Problem: Fluid Volume: Goal: Ability to maintain a balanced intake and output will improve Outcome: Progressing   Problem: Safety: Goal: Ability to remain free from injury will improve Outcome: Progressing   

## 2023-11-06 NOTE — Progress Notes (Signed)
 Patient ID: Katelyn Cline, female   DOB: Dec 05, 1960, 63 y.o.   MRN: [id number]     Advanced Heart Failure Rounding Note  Cardiologist: Lauro Portal, MD  Chief Complaint: Diastolic Heart Failure Subjective:    POD#3 s/p CABG + AVR + LAA clip  Unable to extubated yesterday, remains on PSV.  I/Os net negative 1503 and weight down 1 lb on Lasix  80 mg IV bid, creatinine stable. CVP 8 today.   Tm 100, WBCs 24.  Not on abx.   She will wake up for me and follow commands.   Objective:    Weight Range: 102.3 kg Body mass index is 33.79 kg/m.   Vital Signs:   Temp:  [98.6 F (37 C)-100 F (37.8 C)] 99 F (37.2 C) (05/10 0930) Pulse Rate:  [62-84] 84 (05/10 1000) Resp:  [0-27] 22 (05/10 1000) BP: (96-159)/(49-99) 153/99 (05/10 1000) SpO2:  [83 %-100 %] 96 % (05/10 1000) FiO2 (%):  [40 %] 40 % (05/10 0736) Weight:  [102.3 kg] 102.3 kg (05/10 0500) Last BM Date : 11/02/23  Weight change: Filed Weights   11/04/23 0332 11/05/23 0500 11/06/23 0500  Weight: 101.3 kg 102.9 kg 102.3 kg   Intake/Output:  Intake/Output Summary (Last 24 hours) at 11/06/2023 1022 Last data filed at 11/06/2023 1000 Gross per 24 hour  Intake 776.64 ml  Output 2830 ml  Net -2053.36 ml    Physical Exam    CVP 8 General: NAD Neck: Thick, JVP difficult, no thyromegaly or thyroid  nodule.  Lungs: Clear to auscultation bilaterally with normal respiratory effort. CV: Nondisplaced PMI.  Heart regular S1/S2, no S3/S4, 2/6 SEM RUSB.  No peripheral edema.    Abdomen: Soft, nontender, no hepatosplenomegaly, no distention.  Skin: Intact without lesions or rashes.  Neurologic: Will wake up and follow commands.  Extremities: No clubbing or cyanosis.  HEENT: Normal.   Telemetry   SR in 60s with PACs (personally reviewed)  EKG    N/A  Labs    CBC Recent Labs    11/05/23 0501 11/06/23 0435  WBC 21.0* 24.4*  HGB 8.9* 8.7*  HCT 28.4* 28.9*  MCV 93.4 96.7  PLT 218 262   Basic Metabolic  Panel Recent Labs    11/04/23 1604 11/05/23 0501 11/05/23 1712 11/06/23 0435  NA 145 146* 146* 150*  K 3.8 3.4* 3.5 3.9  CL 107 109 110 114*  CO2 31 29 27 29   GLUCOSE 119* 113* 107* 142*  BUN 36* 42* 44* 43*  CREATININE 1.03* 1.10* 0.97 0.84  CALCIUM  9.0 8.8* 8.8* 8.9  MG 2.2 2.1  --   --    BNP (last 3 results) Recent Labs    07/16/23 1454  BNP 173.5*    Imaging   No results found.  Medications:    Scheduled Medications:  sodium chloride    Intravenous Once   acetaminophen   1,000 mg Oral Q6H   Or   acetaminophen  (TYLENOL ) oral liquid 160 mg/5 mL  1,000 mg Per Tube Q6H   acetaminophen  (TYLENOL ) oral liquid 160 mg/5 mL  650 mg Per Tube Once   arformoterol   15 mcg Nebulization BID   aspirin   324 mg Oral Once   aspirin  EC  325 mg Oral Daily   Or   aspirin   324 mg Per Tube Daily   atorvastatin   80 mg Oral QHS   bisacodyl   10 mg Oral Daily   Or   bisacodyl   10 mg Rectal Daily   buPROPion  ER  100  mg Oral BID   Chlorhexidine  Gluconate Cloth  6 each Topical Daily   docusate  200 mg Per Tube Daily   DULoxetine   60 mg Oral BID   free water  200 mL Per Tube Q8H   furosemide   80 mg Intravenous BID   insulin  aspart  0-20 Units Subcutaneous Q4H   insulin  glargine-yfgn  25 Units Subcutaneous BID   ipratropium-albuterol   3 mL Nebulization Q4H   mouth rinse  15 mL Mouth Rinse Q2H   pantoprazole  (PROTONIX ) IV  40 mg Intravenous Q12H   revefenacin   175 mcg Nebulization Daily   sodium chloride  flush  3 mL Intravenous Q12H   sodium chloride  flush  3-10 mL Intravenous Q12H   sodium chloride  flush  3-10 mL Intravenous Q12H   sodium chloride  flush  3-10 mL Intravenous Q12H   Infusions:  albumin  human Stopped (11/05/23 1301)   calcium  gluconate     epinephrine  Stopped (11/04/23 1037)   norepinephrine  (LEVOPHED ) Adult infusion Stopped (11/04/23 2254)   propofol  (DIPRIVAN ) infusion Stopped (11/04/23 0523)   vasopressin  Stopped (11/04/23 1106)   PRN Medications: albumin   human, ipratropium-albuterol , metoprolol  tartrate, midazolam , morphine  injection, ondansetron  (ZOFRAN ) IV, mouth rinse, oxyCODONE , sodium chloride  flush, sodium chloride  flush, sodium chloride  flush, sodium chloride  flush, traMADol   Patient Profile   Katelyn Cline is 63 year old with history of CAD, PAF 2018, COPD, PAD, HTN, DMII,metastatic melanoma, wedge resection RUL metastatic melanoma, fibromyalgia, and tobacco abuse. S/P CABG + AVR + LAA clip 5/7.  Assessment/Plan   1. Acute on chronic diastolic CHF: Post CABG-bioprosthetic AVR.  She is slow to wean from the vent. My bedside echo showed that LV appears hyperdynamic with EF 65-70%; RV mildly dilated with low normal systolic function.  She is off pressors.  She diuresed well with Lasix  80 mg IV bid yesterday, CVP only 8 today but still well above baseline weight.  - Will give Lasix  80 mg IV bid for at least another day.  2. Acute hypoxemic respiratory failure: History of COPD and probable OHS with home oxygen  4-5 L/min.  She remains on the vent. Now on pressure support. Has still been too sleepy to extubate though now waking up for me and following commands.  - CCM following.  - Will diurese as above to make sure lungs are dry when extubation is attempted.  3. CAD: S/p CABG.  On ASA, statin.  4. Moderate AS: S/p bioprosthetic AVR with CABG.  5. Atrial fibrillation: H/o PAF, NSR currently.  - Will need to resume anticoagulation eventually. 6. Leukocytosis: WBC up to 24 today. Tm 100.   - No antibiotics currently.   CRITICAL CARE Performed by: Peder Bourdon  Total critical care time: 35 minutes  Critical care time was exclusive of separately billable procedures and treating other patients.  Critical care was necessary to treat or prevent imminent or life-threatening deterioration.  Critical care was time spent personally by me on the following activities: development of treatment plan with patient and/or surrogate as well as nursing,  discussions with consultants, evaluation of patient's response to treatment, examination of patient, obtaining history from patient or surrogate, ordering and performing treatments and interventions, ordering and review of laboratory studies, ordering and review of radiographic studies, pulse oximetry and re-evaluation of patient's condition.  Peder Bourdon 11/06/2023 10:22 AM

## 2023-11-06 NOTE — Progress Notes (Signed)
      [address]       ,[place] [address]             [phone]      Still intubated, CPAP   BP (!) 135/48   Pulse 72   Temp 99.5 F (37.5 C)   Resp (!) 24   Ht 5' 8.5" (1.74 m)   Wt 102.3 kg   SpO2 100%   BMI 33.79 kg/m  CVP 12-21   Intake/Output Summary (Last 24 hours) at 11/06/2023 1718 Last data filed at 11/06/2023 1600 Gross per 24 hour  Intake 308.1 ml  Output 2665 ml  Net -2356.9 ml   Vent per CCM  Torian Quintero C. Luna Salinas, MD [facility] [phone]

## 2023-11-06 NOTE — Progress Notes (Signed)
 eLink Physician-Brief Progress Note Patient Name: Katelyn Cline DOB: May 01, 1961 MRN: [id number]   Date of Service  11/06/2023  HPI/Events of Note  Intubated, ventilated, at high risk for self extubation  eICU Interventions  Renew restraints     Intervention Category Minor Interventions: Agitation / anxiety - evaluation and management  [name] 11/06/2023, 10:05 PM

## 2023-11-06 NOTE — Progress Notes (Signed)
 3 Days Post-Op Procedure(s) (LRB): EXPLORATION POST OPERATIVE OPEN HEART; WASHOUT (N/A) ECHOCARDIOGRAM, TRANSESOPHAGEAL (N/A) Subjective: Intubated, lethargic, follows some commands  Objective: Vital signs in last 24 hours: Temp:  [98.6 F (37 C)-100 F (37.8 C)] 99.3 F (37.4 C) (05/10 0800) Pulse Rate:  [62-75] 75 (05/10 0800) Cardiac Rhythm: Normal sinus rhythm (05/10 0800) Resp:  [0-27] 22 (05/10 0800) BP: (96-159)/(49-63) 147/63 (05/10 0800) SpO2:  [91 %-100 %] 98 % (05/10 0811) FiO2 (%):  [40 %] 40 % (05/10 0736) Weight:  [102.3 kg] 102.3 kg (05/10 0500)  Hemodynamic parameters for last 24 hours: CVP:  [3 mmHg-15 mmHg] 10 mmHg  Intake/Output from previous day: 05/09 0701 - 05/10 0700 In: 1027.3 [I.V.:372.4; NG/GT:505; IV Piggyback:149.9] Out: 2530 [Urine:2180; Chest Tube:350] Intake/Output this shift: Total I/O In: 2.5 [I.V.:2.5] Out: 55 [Urine:35; Chest Tube:20]  General appearance: lethargic, no distress Neurologic: follows some commands, moves all 4 spontaneously Heart: regular rate and rhythm and systolic murmur Lungs: clear to auscultation bilaterally Abdomen: normal findings: soft, non-tender  Lab Results: Recent Labs    11/05/23 0501 11/06/23 0435  WBC 21.0* 24.4*  HGB 8.9* 8.7*  HCT 28.4* 28.9*  PLT 218 262   BMET:  Recent Labs    11/05/23 1712 11/06/23 0435  NA 146* 150*  K 3.5 3.9  CL 110 114*  CO2 27 29  GLUCOSE 107* 142*  BUN 44* 43*  CREATININE 0.97 0.84  CALCIUM  8.8* 8.9    PT/INR:  Recent Labs    11/03/23 2259  LABPROT 23.6*  INR 2.1*   ABG    Component Value Date/Time   PHART 7.367 11/04/2023 1202   HCO3 28.4 (H) 11/04/2023 1202   TCO2 30 11/04/2023 1202   ACIDBASEDEF 2.0 11/03/2023 1947   O2SAT 70.8 11/06/2023 0440   CBG (last 3)  Recent Labs    11/06/23 0301 11/06/23 0513 11/06/23 0729  GLUCAP 139* 128* 141*    Assessment/Plan: S/P Procedure(s) (LRB): EXPLORATION POST OPERATIVE OPEN HEART; WASHOUT  (N/A) ECHOCARDIOGRAM, TRANSESOPHAGEAL (N/A) POD # 3 CABG, AVR, LAA clip NEURO- still very lethargic, can be aroused but quickly asleep again  Moves all 4 ext spontaneously and following some commands CV- in SR, BP variable  CVP 7-15  ASA, statin RESP- unable to extubate yesterday due to mental status  On PSV this AM  Vent per CCM RENAL- creatinine normal, BUN stable at 44  Hypernatremia- with diuresis  Will start free water via tube ENDO_ CBG mildly elevated on insulin  drip  Transition to [place] + SSI GI- NPO  Advance diet when extubated Leukocytosis- WBC 24 K, low grade temp 99.7-   No obvious source. Monitor SCD for DVT prophylaxis  Will add enoxaparin   LOS: 12 days    Katelyn Cline 11/06/2023

## 2023-11-06 NOTE — Progress Notes (Signed)
 NAME:  Katelyn Cline, MRN:  [id number], DOB:  1960/07/11, LOS: 12 ADMISSION DATE:  10/25/2023, CONSULTATION DATE:  5/7 REFERRING MD:  Dr. Deloise Ferries, CHIEF COMPLAINT:  CABG x 3; AVR; atriclip  History of Present Illness:  Patient is a 63 yo F w/ pertinent PMH of 3v CAD, moderate aortic stenosis, Afib on apixaban , COPD on 4L [place], 100 pack year history, DMT2, HTN, HLD, PAD presents to [facility] ED on 4/28 w/ unstable angina.  Patient followed by cardiology outpt. Coronary calcium  score on 06/2023 1238 in all 3 coronary arteries. Echo w/ normal systolic function and moderate aortic stenosis. 08/2023 cardaic PET was high risk. Cardiac cath showing 3 vessel disease. Plan to see TCTS for potential CABG/AVR.  On 4/28 having chest pain radiating down to left arm and jaw. Came to [facility] ed. Cards admitted for unstable angina. Started on heparin  iv and asa. Given sublingual nitroglycerin . Trop mildly elevated. Also found to have anemia and fecal occult positive. Started on PPI. GI consulted recommending continuing PPI BID and hold on EGD for now.   Pertinent  Medical History   Past Medical History:  Diagnosis Date   Agoraphobia    Anxiety    Arthritis    "all joints"   Chronic low back pain    Chronic respiratory failure with hypoxia ([name])    4L  via [place],  followed by pcp,   (09-16-2018  per pt only uses while at home and at night ,  portable oxygen  is not working)   Clotting disorder ([name]) 2013   Polycythemia vera   Colon polyps    COPD (chronic obstructive pulmonary disease) ([name])    DDD (degenerative disc disease), lumbosacral    Dental caries    DM type 2 (diabetes mellitus, type 2) ([name])    followed by pcp   Fibromyalgia    GERD (gastroesophageal reflux disease)    occasional , no meds   Heart murmur 2020   History of basal cell carcinoma (BCC) excision    s/p  Moh's of face/ nose 12/ 2013   History of palpitations    per pt found to have occaional PVCs   History of UTI    HTN (hypertension)     Hyperlipidemia    Loose, teeth    Major depression    Metastatic melanoma ([name]) 10/18/2012   12 mm posterior right upper lobe pulmonary nodule, max SUV 3.0  s/p  right wedge resection 10-21-2012,  followed by oncologist-- dr Salomon Cree,  no recurrence   Mixed stress and urge urinary incontinence    Myeloproliferative neoplasm ([name])    Narcolepsy    Obesity    OSA (obstructive sleep apnea)    Oxygen  deficiency    Oxygen  dependent    4 L via [place],  per pt portable oxygen  not working but does use while at home and at night   Panic attacks    Periodontitis    Peripheral neuropathy    feet   Polycythemia vera(238.4) hematology/ oncology-- dr Salomon Cree ([facility])   first dx 02014 due to smoker/copd--  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)  hx phlebotomies   Pulmonary nodules    bilateral small stable per CT 03-11-2018   Scoliosis    Sleep apnea    Smokers' cough ([name])    per pt productive a little in the morning's   Symptoms of upper respiratory infection (URI) 03/12/2018   Urinary (tract) obstruction    Wears glasses  Significant Hospital Events: Including procedures, antibiotic start and stop dates in addition to other pertinent events   4/28 admitted w/ unstable angina; possible GIB 5/7 CABG x 3 w/ AVR; having blood loss requiring multiple PRBCs and FFP; taken back to OR overnight with no source of bleeding; left hemothorax evacuated  Interim History / Subjective:  Remains off sedation, still not regularly following commands  Objective   Blood pressure (!) 153/99, pulse 84, temperature 99 F (37.2 C), resp. rate (!) 22, height 5' 8.5" (1.74 m), weight 102.3 kg, SpO2 96%. CVP:  [3 mmHg-27 mmHg] 27 mmHg  Vent Mode: PSV;CPAP FiO2 (%):  [40 %] 40 % Set Rate:  [16 bmp-18 bmp] 16 bmp Vt Set:  [520 mL] 520 mL PEEP:  [5 cmH20] 5 cmH20 Pressure Support:  [8 cmH20-10 cmH20] 10 cmH20 Plateau Pressure:  [17 cmH20-24 cmH20] 24 cmH20   Intake/Output Summary  (Last 24 hours) at 11/06/2023 1019 Last data filed at 11/06/2023 1000 Gross per 24 hour  Intake 776.64 ml  Output 2830 ml  Net -2053.36 ml   Filed Weights   11/04/23 0332 11/05/23 0500 11/06/23 0500  Weight: 101.3 kg 102.9 kg 102.3 kg    Examination: General: Critically ill-appearing woman lying in bed no acute distress HEENT: [place]/AT, eyes anicteric, endotracheal tube in place  neuro: RASS -2, moving but not particularly interactive.   CV: S1-S2, regular rate and rhythm, Minimal bloody output from chest tubes. PULM: Tachypnea, using accessory muscles, total use in the low 300s.  Thick tan secretions from endotracheal tube. GI: Obese, soft, nontender Extremities: Mild edema, no cyanosis  Coox 71% Na+  150 K+ 3.9 BUN 43 Cr 0.84 WBC 24.4 H/H 8.7/28.9 Platelets 262 Ammonia 36   Resolved Hospital Problem list     Assessment & Plan:  Postoperative vent management Chronic respiratory failure on 3L home oxygen  for COPD> not acutely exacerbated -Continue minimizing sedation - Lactulose  to help with mentation - LBBB - VAP prevention protocol-protocol for sedation - Continue daily weaning efforts.  Mental status is a large barrier to extubation, although her ventilation is likely an adequate as well with borderline low tidal volumes -Trach aspirate culture today - Start empiric antibiotics -Bronchodilators  S/p 3-vessel CABG and bioprosthetic AVR, atrial clip, MAZE Moderate aortic stenosis CAD HTN HLD - Postop care per T CTS - Continue monitor chest tube output - Aspirin , statin - Pain control per protocol  Acute metabolic encephalopathy Hyperammonemia - Lactulose  every 6 hours -Delirium precautions - Free water for hypernatremia  Hyperglycemia; h/o DM. A1c 5.1 -Continue glargine 25 units daily - Sliding scale insulin  as needed  Tobacco abuse -cessation counseling when appropriate -consider nicotine  patch if needed   Post-op ABLA, expected H/o polycythemia  vera H/o Anemia due to GIB earlier this admission> did not undergo invasive evaluation due to cardiac instability - GI consulted earlier in this admission.  Recommend twice daily PPI and holding EGD for now - Continue holding PTA hydroxyurea  - Heparin  has been on hold; can retrial with DVT prophylaxis at some point when chest tube output is stable   PAD with claudication -Aspirin  and statin  Chronic Pain Peripheral Neuropathy Depression -can resume PTA wellbutrin  when able to take PO meds -PAD protocol on the vent   Hypokalemia-repleted -monitor   Best Practice (right click and "Reselect all SmartList Selections" daily)   Diet/type: NPO w/ meds via tube DVT prophylaxis: SCD GI prophylaxis: PPI Lines: Central line and Arterial Line Foley:  Yes, and it is still  needed Code Status:  full code Last date of multidisciplinary goals of care discussion [per primary]  Labs   CBC: Recent Labs  Lab 11/03/23 2259 11/03/23 2306 11/04/23 0500 11/04/23 1202 11/04/23 1604 11/05/23 0501 11/06/23 0435  WBC 18.9*  --  15.8*  --  18.7* 21.0* 24.4*  HGB 10.4*   < > 9.7* 9.5* 9.4* 8.9* 8.7*  HCT 30.9*   < > 29.0* 28.0* 29.2* 28.4* 28.9*  MCV 88.8  --  88.7  --  92.1 93.4 96.7  PLT 190  --  199  --  187 218 262   < > = values in this interval not displayed.    Basic Metabolic Panel: Recent Labs  Lab 11/03/23 2259 11/03/23 2306 11/04/23 0500 11/04/23 1202 11/04/23 1604 11/05/23 0501 11/05/23 1712 11/06/23 0435  NA 142   < > 142 143 145 146* 146* 150*  K 3.9   < > 3.7 4.0 3.8 3.4* 3.5 3.9  CL 104  --  107  --  107 109 110 114*  CO2 23  --  26  --  31 29 27 29   GLUCOSE 191*  --  135*  --  119* 113* 107* 142*  BUN 24*  --  28*  --  36* 42* 44* 43*  CREATININE 0.85  --  0.95  --  1.03* 1.10* 0.97 0.84  CALCIUM  9.5  --  9.3  --  9.0 8.8* 8.8* 8.9  MG 2.4  --  2.2  --  2.2 2.1  --   --    < > = values in this interval not displayed.   GFR: Estimated Creatinine Clearance: 84.3  mL/min (by C-G formula based on SCr of 0.84 mg/dL). Recent Labs  Lab 11/04/23 0500 11/04/23 1604 11/05/23 0501 11/06/23 0435  WBC 15.8* 18.7* 21.0* 24.4*   ABG    Component Value Date/Time   PHART 7.367 11/04/2023 1202   PCO2ART 49.5 (H) 11/04/2023 1202   PO2ART 89 11/04/2023 1202   HCO3 28.4 (H) 11/04/2023 1202   TCO2 30 11/04/2023 1202   ACIDBASEDEF 2.0 11/03/2023 1947   O2SAT 70.8 11/06/2023 0440    CBG: Recent Labs  Lab 11/06/23 0153 11/06/23 0301 11/06/23 0513 11/06/23 0729 11/06/23 0945  GLUCAP 119* 139* 128* 141* 127*    Critical care time:      This patient is critically ill with multiple organ system failure which requires frequent high complexity decision making, assessment, support, evaluation, and titration of therapies. This was completed through the application of advanced monitoring technologies and extensive interpretation of multiple databases. During this encounter critical care time was devoted to patient care services described in this note for 37 minutes.  Joesph Mussel, DO 11/06/23 10:19 AM  Pulmonary & Critical Care  For contact information, see Amion. If no response to pager, please call PCCM consult pager. After hours, 7PM- 7AM, please call Elink.

## 2023-11-07 ENCOUNTER — Inpatient Hospital Stay (HOSPITAL_COMMUNITY)

## 2023-11-07 DIAGNOSIS — J9611 Chronic respiratory failure with hypoxia: Secondary | ICD-10-CM | POA: Diagnosis not present

## 2023-11-07 DIAGNOSIS — I10 Essential (primary) hypertension: Secondary | ICD-10-CM | POA: Diagnosis not present

## 2023-11-07 DIAGNOSIS — J449 Chronic obstructive pulmonary disease, unspecified: Secondary | ICD-10-CM | POA: Diagnosis not present

## 2023-11-07 DIAGNOSIS — E785 Hyperlipidemia, unspecified: Secondary | ICD-10-CM | POA: Diagnosis not present

## 2023-11-07 LAB — COOXEMETRY PANEL
Carboxyhemoglobin: 3.4 % — ABNORMAL HIGH (ref 0.5–1.5)
Methemoglobin: <0.7 % (ref 0.0–1.5)
O2 Saturation: 69.2 %
Total hemoglobin: 9.1 g/dL — ABNORMAL LOW (ref 12.0–16.0)

## 2023-11-07 LAB — BASIC METABOLIC PANEL WITH GFR
Anion gap: 8 (ref 5–15)
BUN: 30 mg/dL — ABNORMAL HIGH (ref 8–23)
CO2: 30 mmol/L (ref 22–32)
Calcium: 8.5 mg/dL — ABNORMAL LOW (ref 8.9–10.3)
Chloride: 112 mmol/L — ABNORMAL HIGH (ref 98–111)
Creatinine, Ser: 0.57 mg/dL (ref 0.44–1.00)
GFR, Estimated: >60 mL/min (ref >60)
Glucose, Bld: 134 mg/dL — ABNORMAL HIGH (ref 70–99)
Potassium: 3 mmol/L — ABNORMAL LOW (ref 3.5–5.1)
Sodium: 150 mmol/L — ABNORMAL HIGH (ref 135–145)

## 2023-11-07 LAB — CBC
HCT: 29.6 % — ABNORMAL LOW (ref 36.0–46.0)
Hemoglobin: 8.9 g/dL — ABNORMAL LOW (ref 12.0–15.0)
MCH: 29.2 pg (ref 26.0–34.0)
MCHC: 30.1 g/dL (ref 30.0–36.0)
MCV: 97 fL (ref 80.0–100.0)
Platelets: 376 10*3/uL (ref 150–400)
RBC: 3.05 MIL/uL — ABNORMAL LOW (ref 3.87–5.11)
RDW: 19.8 % — ABNORMAL HIGH (ref 11.5–15.5)
WBC: 24.5 10*3/uL — ABNORMAL HIGH (ref 4.0–10.5)
nRBC: 0.5 % — ABNORMAL HIGH (ref 0.0–0.2)

## 2023-11-07 LAB — GLUCOSE, CAPILLARY
Glucose-Capillary: 127 mg/dL — ABNORMAL HIGH (ref 70–99)
Glucose-Capillary: 129 mg/dL — ABNORMAL HIGH (ref 70–99)
Glucose-Capillary: 124 mg/dL — ABNORMAL HIGH (ref 70–99)
Glucose-Capillary: 100 mg/dL — ABNORMAL HIGH (ref 70–99)
Glucose-Capillary: 95 mg/dL (ref 70–99)
Glucose-Capillary: 132 mg/dL — ABNORMAL HIGH (ref 70–99)

## 2023-11-07 MED ORDER — POTASSIUM CHLORIDE 10 MEQ/50ML IV SOLN
INTRAVENOUS | Status: AC
  Filled 2023-11-07: qty 50

## 2023-11-07 MED ORDER — HYDRALAZINE HCL 20 MG/ML IJ SOLN
INTRAMUSCULAR | Status: AC
  Filled 2023-11-07: qty 1

## 2023-11-07 MED ORDER — ORAL CARE MOUTH RINSE: 15.0000 mL | OROMUCOSAL | Status: DC | PRN

## 2023-11-07 MED ORDER — ENOXAPARIN SODIUM 40 MG/0.4ML IJ SOSY
40.0000 mg | PREFILLED_SYRINGE | INTRAMUSCULAR | Status: DC
  Filled 2023-11-07: qty 0.4

## 2023-11-07 MED ORDER — METOPROLOL TARTRATE 25 MG/10 ML ORAL SUSPENSION
25.0000 mg | Freq: Two times a day (BID) | ORAL | Status: DC
  Filled 2023-11-07: qty 10

## 2023-11-07 MED ORDER — HYDRALAZINE HCL 20 MG/ML IJ SOLN
10.0000 mg | Freq: Four times a day (QID) | INTRAMUSCULAR | Status: DC | PRN
  Administered 2023-11-07: 10 mg via INTRAVENOUS

## 2023-11-07 MED ORDER — HYDRALAZINE HCL 20 MG/ML IJ SOLN: 5.0000 mg | Freq: Four times a day (QID) | INTRAMUSCULAR | Status: DC | PRN

## 2023-11-07 MED ORDER — ENOXAPARIN SODIUM 40 MG/0.4ML IJ SOSY
40.0000 mg | PREFILLED_SYRINGE | INTRAMUSCULAR | Status: DC
  Administered 2023-11-08: 40 mg via SUBCUTANEOUS
  Filled 2023-11-07: qty 0.4

## 2023-11-07 MED ORDER — METOPROLOL TARTRATE 25 MG PO TABS: 25.0000 mg | ORAL_TABLET | Freq: Two times a day (BID) | ORAL | Status: DC

## 2023-11-07 MED ORDER — METOPROLOL TARTRATE 25 MG PO TABS
25.0000 mg | ORAL_TABLET | Freq: Two times a day (BID) | ORAL | Status: DC
  Administered 2023-11-07 – 2023-11-10 (×4): 25 mg via ORAL
  Filled 2023-11-07 (×6): qty 1

## 2023-11-07 MED ORDER — LACTULOSE 10 GM/15ML PO SOLN
10.0000 g | Freq: Once | ORAL | Status: AC
  Administered 2023-11-07: 10 g via ORAL
  Filled 2023-11-07: qty 15

## 2023-11-07 MED ORDER — HYDRALAZINE HCL 10 MG PO TABS
10.0000 mg | ORAL_TABLET | Freq: Three times a day (TID) | ORAL | Status: DC
  Administered 2023-11-07 – 2023-11-10 (×6): 10 mg via ORAL
  Filled 2023-11-07 (×7): qty 1

## 2023-11-07 MED ORDER — FREE WATER
200.0000 mL | Status: DC
  Administered 2023-11-07: 200 mL

## 2023-11-07 MED ORDER — BISACODYL 10 MG RE SUPP
10.0000 mg | Freq: Once | RECTAL | Status: AC
  Administered 2023-11-07: 10 mg via RECTAL
  Filled 2023-11-07: qty 1

## 2023-11-07 MED ORDER — POTASSIUM CHLORIDE 20 MEQ PO PACK
20.0000 meq | PACK | ORAL | Status: DC
  Administered 2023-11-07: 20 meq
  Filled 2023-11-07: qty 1

## 2023-11-07 MED ORDER — POTASSIUM CHLORIDE 10 MEQ/50ML IV SOLN
10.0000 meq | INTRAVENOUS | Status: AC
  Administered 2023-11-07: 10 meq via INTRAVENOUS
  Filled 2023-11-07 (×2): qty 50

## 2023-11-07 MED ORDER — POTASSIUM CHLORIDE 10 MEQ/50ML IV SOLN
INTRAVENOUS | Status: AC
  Administered 2023-11-07: 10 meq via INTRAVENOUS
  Filled 2023-11-07: qty 50

## 2023-11-07 MED ORDER — POTASSIUM CHLORIDE 10 MEQ/50ML IV SOLN
10.0000 meq | INTRAVENOUS | Status: AC
  Administered 2023-11-07 (×4): 10 meq via INTRAVENOUS
  Filled 2023-11-07 (×2): qty 50

## 2023-11-07 NOTE — Progress Notes (Signed)
 4 Days Post-Op Procedure(s) (LRB): EXPLORATION POST OPERATIVE OPEN HEART; WASHOUT (N/A) ECHOCARDIOGRAM, TRANSESOPHAGEAL (N/A) Subjective: Tolerated CPAP yesterday, rested overnight, on CPAP now Alert and following commands  Objective: Vital signs in last 24 hours: Temp:  [99 F (37.2 C)-100 F (37.8 C)] 100 F (37.8 C) (05/11 0400) Pulse Rate:  [63-101] 66 (05/11 0811) Cardiac Rhythm: Normal sinus rhythm (05/10 2000) Resp:  [5-37] 24 (05/11 0811) BP: (107-160)/(48-99) 150/62 (05/11 0800) SpO2:  [83 %-100 %] 100 % (05/11 0811) FiO2 (%):  [40 %-50 %] 40 % (05/11 0811) Weight:  [99.4 kg] 99.4 kg (05/11 0500)  Hemodynamic parameters for last 24 hours: CVP:  [6 mmHg-26 mmHg] 6 mmHg  Intake/Output from previous day: 05/10 0701 - 05/11 0700 In: 218.1 [I.V.:18.2; IV Piggyback:199.9] Out: 3825 [Urine:3685; Chest Tube:140] Intake/Output this shift: No intake/output data recorded.  General appearance: alert, cooperative, and no distress Neurologic: no focal deficit Heart: irregularly irregular rhythm Lungs: clear to auscultation bilaterally Abdomen: normal findings: soft, non-tender  Lab Results: Recent Labs    11/06/23 0435 11/07/23 0345  WBC 24.4* 24.5*  HGB 8.7* 8.9*  HCT 28.9* 29.6*  PLT 262 376   BMET:  Recent Labs    11/06/23 0435 11/07/23 0345  NA 150* 150*  K 3.9 3.0*  CL 114* 112*  CO2 29 30  GLUCOSE 142* 134*  BUN 43* 30*  CREATININE 0.84 0.57  CALCIUM  8.9 8.5*    PT/INR: No results for input(s): "LABPROT", "INR" in the last 72 hours. ABG    Component Value Date/Time   PHART 7.367 11/04/2023 1202   HCO3 28.4 (H) 11/04/2023 1202   TCO2 30 11/04/2023 1202   ACIDBASEDEF 2.0 11/03/2023 1947   O2SAT 69.2 11/07/2023 0345   CBG (last 3)  Recent Labs    11/06/23 1146 11/06/23 1536 11/07/23 0332  GLUCAP 131* 134* 127*    Assessment/Plan: S/P Procedure(s) (LRB): EXPLORATION POST OPERATIVE OPEN HEART; WASHOUT (N/A) ECHOCARDIOGRAM, TRANSESOPHAGEAL  (N/A) POD # 4 CABG, AVR, LAA clip NEURO- alert and following commands CV- in SR with frequent PACs  Co-ox 69  CVP not accurately recorded  ASA, statin  Hypertensive- start metoprolol  RESP_ Still intubated  On CPAP, hopefully can extubate today  Continue bronchodilators  On Maxipime  day 2 RENAL- creatinine normal  Diuresing well- 3.5 L negative yesterday  Hypernatremia- stable, continue free water  Hypokalemia- supplement ENDO- CBG well controlled GI- advance diet once extubated ID- low grade temps  WBC stable at 24K  Maxipime  SCD for DVT prophylaxis, add enoxaparin  Dc chest tubes   LOS: 13 days    Zelphia Higashi 11/07/2023

## 2023-11-07 NOTE — Progress Notes (Signed)
 HTN since extubated today, staying with SBP 150-160s Hydralazine  ordered earlier due to low normal Hrs. Got oral metoprolol  25mg  this evening.  Adding oral hydralazine  at lose dose, can increase if still uncontrolled. Can add back her low dose ACE if needed as well.  Joesph Mussel, DO 11/07/23 6:40 PM  Pulmonary & Critical Care  For contact information, see Amion. If no response to pager, please call PCCM consult pager. After hours, 7PM- 7AM, please call Elink.

## 2023-11-07 NOTE — Progress Notes (Signed)
 NAME:  Katelyn Cline, MRN:  [id number], DOB:  May 12, 1961, LOS: 13 ADMISSION DATE:  10/25/2023, CONSULTATION DATE:  5/7 REFERRING MD:  Dr. Deloise Cline, CHIEF COMPLAINT:  CABG x 3; AVR; atriclip  History of Present Illness:  Patient is a 63 yo F w/ pertinent PMH of 3v CAD, moderate aortic stenosis, Afib on apixaban , COPD on 4L [place], 100 pack year history, DMT2, HTN, HLD, PAD presents to [facility] ED on 4/28 w/ unstable angina.  Patient followed by cardiology outpt. Coronary calcium  score on 06/2023 1238 in all 3 coronary arteries. Echo w/ normal systolic function and moderate aortic stenosis. 08/2023 cardaic PET was high risk. Cardiac cath showing 3 vessel disease. Plan to see TCTS for potential CABG/AVR.  On 4/28 having chest pain radiating down to left arm and jaw. Came to [facility] ed. Cards admitted for unstable angina. Started on heparin  iv and asa. Given sublingual nitroglycerin . Trop mildly elevated. Also found to have anemia and fecal occult positive. Started on PPI. GI consulted recommending continuing PPI BID and hold on EGD for now.   Pertinent  Medical History   Past Medical History:  Diagnosis Date   Agoraphobia    Anxiety    Arthritis    "all joints"   Chronic low back pain    Chronic respiratory failure with hypoxia ([name])    4L  via [place],  followed by pcp,   (09-16-2018  per pt only uses while at home and at night ,  portable oxygen  is not working)   Clotting disorder ([name]) 2013   Polycythemia vera   Colon polyps    COPD (chronic obstructive pulmonary disease) ([name])    DDD (degenerative disc disease), lumbosacral    Dental caries    DM type 2 (diabetes mellitus, type 2) ([name])    followed by pcp   Fibromyalgia    GERD (gastroesophageal reflux disease)    occasional , no meds   Heart murmur 2020   History of basal cell carcinoma (BCC) excision    s/p  Moh's of face/ nose 12/ 2013   History of palpitations    per pt found to have occaional PVCs   History of UTI    HTN (hypertension)     Hyperlipidemia    Loose, teeth    Major depression    Metastatic melanoma ([name]) 10/18/2012   12 mm posterior right upper lobe pulmonary nodule, max SUV 3.0  s/p  right wedge resection 10-21-2012,  followed by oncologist-- dr Katelyn Cline,  no recurrence   Mixed stress and urge urinary incontinence    Myeloproliferative neoplasm ([name])    Narcolepsy    Obesity    OSA (obstructive sleep apnea)    Oxygen  deficiency    Oxygen  dependent    4 L via ,  per pt portable oxygen  not working but does use while at home and at night   Panic attacks    Periodontitis    Peripheral neuropathy    feet   Polycythemia vera(238.4) hematology/ oncology-- dr Katelyn Cline ([facility])   first dx 02014 due to smoker/copd--  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)  hx phlebotomies   Pulmonary nodules    bilateral small stable per CT 03-11-2018   Scoliosis    Sleep apnea    Smokers' cough ([name])    per pt productive a little in the morning's   Symptoms of upper respiratory infection (URI) 03/12/2018   Urinary (tract) obstruction    Wears glasses  Significant Hospital Events: Including procedures, antibiotic start and stop dates in addition to other pertinent events   4/28 admitted w/ unstable angina; possible GIB 5/7 CABG x 3 w/ AVR; having blood loss requiring multiple PRBCs and FFP; taken back to OR overnight with no source of bleeding; left hemothorax evacuated  Interim History / Subjective:  Still off sedation. She denies complaints today but wants to be extubated.   Objective   Blood pressure (!) 150/62, pulse 66, temperature 100 F (37.8 C), temperature source Bladder, resp. rate (!) 24, height 5' 8.5" (1.74 m), weight 99.4 kg, SpO2 100%. CVP:  [6 mmHg-26 mmHg] 6 mmHg  Vent Mode: CPAP;PSV FiO2 (%):  [40 %-50 %] 40 % Set Rate:  [16 bmp] 16 bmp Vt Set:  [520 mL] 520 mL PEEP:  [5 cmH20] 5 cmH20 Pressure Support:  [10 cmH20] 10 cmH20   Intake/Output Summary (Last 24 hours)  at 11/07/2023 0900 Last data filed at 11/07/2023 0400 Gross per 24 hour  Intake 215.65 ml  Output 3420 ml  Net -3204.35 ml   Filed Weights   11/05/23 0500 11/06/23 0500 11/07/23 0500  Weight: 102.9 kg 102.3 kg 99.4 kg    Examination: General: critically ill appearing woman lying in bed in NAD HEENT: [place]/AT, eyes anicteric  neuro: RASS 0, moving all extremities and lifting head off pillow CV: S1S2, RRR PULM: rhonchi on left, breathing comfortably on 8/5, no wheezing. Mild thick secretions from ETT.  GI: obese, soft, NT Extremities: no cyanosis, mild edema  Coox 69% Na+  150 K+ 3.0 BUN 30 Cr 0.57 WBC 24.5 H/H 8.9/29.6 Platelets 376 CXR personally reviewed> no infiltrates, retrocardiac silhouetting. ETT in place.   Resolved Hospital Problem list     Assessment & Plan:  Postoperative vent management Chronic respiratory failure on 3L home oxygen  for COPD> not acutely exacerbated -SAT & SBT; likely to extubate today. Will extubate to bipap given history of severe COPD -con't lactulose  today -LBBB -VAP prevention protocol -PAD protocol -trach aspirate culture> Luken's trap attached to vent with specimen now -con't bronchodilators -con't empiric cefepime ; plan for 7 day course  S/p 3-vessel CABG and bioprosthetic AVR, atrial clip, MAZE Moderate aortic stenosis CAD HTN HLD -post-op care per TCTS -hopefully can d/c chest tubes today -aspirin , statin -can add back low dose pain meds as needed; need to watch for encephalopathy  Acute metabolic encephalopathy Hyperammonemia - Lactulose  every 6 hours-- con't today, then can stop -delirium precautions - Free water for hypernatremia> can drink ad lib once she passes swallow evaluation  Hyperglycemia; h/o DM. A1c 5.1 -con't glargine 25 unnits BID -SSI PRN -can add mealtime coverage once she is eating  Tobacco abuse -cessation counseling needed   Post-op ABLA, expected H/o polycythemia vera H/o Anemia due to GIB  earlier this admission> did not undergo invasive evaluation due to cardiac instability - GI consulted earlier in this admission.  Recommend twice daily PPI and holding EGD for now - con't holding PTA hydroxyurea  for now -lovenox  for DVT prophylaxis   PAD with claudication -aspirin , statin -needs to quit smoking  Chronic Pain Peripheral Neuropathy Depression -can resume PTA wellbutrin  when able to take PO meds> can resume today -PAD protocol   Hypokalemia-repleted -replete IV & oral   Best Practice (right click and "Reselect all SmartList Selections" daily)   Diet/type: NPO w/ meds via tube; bedside swallow after extubation DVT prophylaxis: lovenox  GI prophylaxis: PPI Lines: Central line and Arterial Line Foley:  Yes, and it is still needed  Code Status:  full code Last date of multidisciplinary goals of care discussion [per primary]  Labs   CBC: Recent Labs  Lab 11/04/23 0500 11/04/23 1202 11/04/23 1604 11/05/23 0501 11/06/23 0435 11/07/23 0345  WBC 15.8*  --  18.7* 21.0* 24.4* 24.5*  HGB 9.7* 9.5* 9.4* 8.9* 8.7* 8.9*  HCT 29.0* 28.0* 29.2* 28.4* 28.9* 29.6*  MCV 88.7  --  92.1 93.4 96.7 97.0  PLT 199  --  187 218 262 376    Basic Metabolic Panel: Recent Labs  Lab 11/03/23 2259 11/03/23 2306 11/04/23 0500 11/04/23 1202 11/04/23 1604 11/05/23 0501 11/05/23 1712 11/06/23 0435 11/07/23 0345  NA 142   < > 142   < > 145 146* 146* 150* 150*  K 3.9   < > 3.7   < > 3.8 3.4* 3.5 3.9 3.0*  CL 104  --  107  --  107 109 110 114* 112*  CO2 23  --  26  --  31 29 27 29 30   GLUCOSE 191*  --  135*  --  119* 113* 107* 142* 134*  BUN 24*  --  28*  --  36* 42* 44* 43* 30*  CREATININE 0.85  --  0.95  --  1.03* 1.10* 0.97 0.84 0.57  CALCIUM  9.5  --  9.3  --  9.0 8.8* 8.8* 8.9 8.5*  MG 2.4  --  2.2  --  2.2 2.1  --   --   --    < > = values in this interval not displayed.   GFR: Estimated Creatinine Clearance: 87.2 mL/min (by C-G formula based on SCr of 0.57  mg/dL). Recent Labs  Lab 11/04/23 1604 11/05/23 0501 11/06/23 0435 11/07/23 0345  WBC 18.7* 21.0* 24.4* 24.5*   ABG    Component Value Date/Time   PHART 7.367 11/04/2023 1202   PCO2ART 49.5 (H) 11/04/2023 1202   PO2ART 89 11/04/2023 1202   HCO3 28.4 (H) 11/04/2023 1202   TCO2 30 11/04/2023 1202   ACIDBASEDEF 2.0 11/03/2023 1947   O2SAT 69.2 11/07/2023 0345    CBG: Recent Labs  Lab 11/06/23 0729 11/06/23 0945 11/06/23 1146 11/06/23 1536 11/07/23 0332  GLUCAP 141* 127* 131* 134* 127*    Critical care time:      This patient is critically ill with multiple organ system failure which requires frequent high complexity decision making, assessment, support, evaluation, and titration of therapies. This was completed through the application of advanced monitoring technologies and extensive interpretation of multiple databases. During this encounter critical care time was devoted to patient care services described in this note for 36 minutes.  Joesph Mussel, DO 11/07/23 9:35 AM [facility]  For contact information, see Amion. If no response to pager, please call PCCM consult pager. After hours, 7PM- 7AM, please call Elink.

## 2023-11-07 NOTE — Progress Notes (Signed)
      [address]       [place] [address]             [phone]      POD # 4  Extubated earlier today  BP (!) 161/66   Pulse 64   Temp 97.8 F (36.6 C) (Axillary)   Resp (!) 21   Ht 5' 8.5" (1.74 m)   Wt 99.4 kg   SpO2 100%   BMI 32.84 kg/m  On [place] O2 95% sat CVP 5  Intake/Output Summary (Last 24 hours) at 11/07/2023 1923 Last data filed at 11/07/2023 1800 Gross per 24 hour  Intake 542.62 ml  Output 3960 ml  Net -3417.38 ml   Landon Pinion C. Luna Salinas, MD [facility] [phone]

## 2023-11-07 NOTE — Plan of Care (Signed)
  Problem: Fluid Volume: Goal: Ability to maintain a balanced intake and output will improve Outcome: Progressing   Problem: Activity: Goal: Ability to tolerate increased activity will improve Outcome: Progressing   Problem: Nutrition: Goal: Adequate nutrition will be maintained Outcome: Progressing   Problem: Safety: Goal: Ability to remain free from injury will improve Outcome: Progressing

## 2023-11-08 ENCOUNTER — Inpatient Hospital Stay (HOSPITAL_COMMUNITY)

## 2023-11-08 DIAGNOSIS — J9612 Chronic respiratory failure with hypercapnia: Secondary | ICD-10-CM | POA: Diagnosis not present

## 2023-11-08 DIAGNOSIS — J449 Chronic obstructive pulmonary disease, unspecified: Secondary | ICD-10-CM | POA: Diagnosis not present

## 2023-11-08 DIAGNOSIS — I200 Unstable angina: Secondary | ICD-10-CM | POA: Diagnosis not present

## 2023-11-08 DIAGNOSIS — I5033 Acute on chronic diastolic (congestive) heart failure: Secondary | ICD-10-CM | POA: Diagnosis not present

## 2023-11-08 DIAGNOSIS — J9611 Chronic respiratory failure with hypoxia: Secondary | ICD-10-CM | POA: Diagnosis not present

## 2023-11-08 DIAGNOSIS — E876 Hypokalemia: Secondary | ICD-10-CM

## 2023-11-08 DIAGNOSIS — I739 Peripheral vascular disease, unspecified: Secondary | ICD-10-CM | POA: Diagnosis not present

## 2023-11-08 DIAGNOSIS — Z0279 Encounter for issue of other medical certificate: Secondary | ICD-10-CM

## 2023-11-08 LAB — BASIC METABOLIC PANEL WITH GFR
Anion gap: 7 (ref 5–15)
Anion gap: 8 (ref 5–15)
Anion gap: 9 (ref 5–15)
BUN: 19 mg/dL (ref 8–23)
BUN: 20 mg/dL (ref 8–23)
BUN: 20 mg/dL (ref 8–23)
CO2: 28 mmol/L (ref 22–32)
CO2: 29 mmol/L (ref 22–32)
CO2: 29 mmol/L (ref 22–32)
Calcium: 8.1 mg/dL — ABNORMAL LOW (ref 8.9–10.3)
Calcium: 8.2 mg/dL — ABNORMAL LOW (ref 8.9–10.3)
Calcium: 8.3 mg/dL — ABNORMAL LOW (ref 8.9–10.3)
Chloride: 105 mmol/L (ref 98–111)
Chloride: 106 mmol/L (ref 98–111)
Chloride: 107 mmol/L (ref 98–111)
Creatinine, Ser: 0.48 mg/dL (ref 0.44–1.00)
Creatinine, Ser: 0.49 mg/dL (ref 0.44–1.00)
Creatinine, Ser: 0.57 mg/dL (ref 0.44–1.00)
GFR, Estimated: >60 mL/min (ref >60)
GFR, Estimated: >60 mL/min (ref >60)
GFR, Estimated: >60 mL/min (ref >60)
Glucose, Bld: 101 mg/dL — ABNORMAL HIGH (ref 70–99)
Glucose, Bld: 110 mg/dL — ABNORMAL HIGH (ref 70–99)
Glucose, Bld: 123 mg/dL — ABNORMAL HIGH (ref 70–99)
Potassium: 3.2 mmol/L — ABNORMAL LOW (ref 3.5–5.1)
Potassium: 3.3 mmol/L — ABNORMAL LOW (ref 3.5–5.1)
Potassium: 4 mmol/L (ref 3.5–5.1)
Sodium: 140 mmol/L (ref 135–145)
Sodium: 143 mmol/L (ref 135–145)
Sodium: 145 mmol/L (ref 135–145)

## 2023-11-08 LAB — ECHOCARDIOGRAM COMPLETE
AR max vel: 1.97 cm2
AV Area VTI: 2.22 cm2
AV Area mean vel: 1.94 cm2
AV Mean grad: 17 mmHg
AV Peak grad: 25.7 mmHg
Ao pk vel: 2.53 m/s
Area-P 1/2: 2.97 cm2
Calc EF: 77.1 %
Height: 68.5 in
MV VTI: 2.17 cm2
S' Lateral: 3.1 cm
Single Plane A2C EF: 84.6 %
Single Plane A4C EF: 64.9 %
Weight: 3545 [oz_av]

## 2023-11-08 LAB — GLUCOSE, CAPILLARY
Glucose-Capillary: 93 mg/dL (ref 70–99)
Glucose-Capillary: 84 mg/dL (ref 70–99)
Glucose-Capillary: 86 mg/dL (ref 70–99)
Glucose-Capillary: 98 mg/dL (ref 70–99)
Glucose-Capillary: 136 mg/dL — ABNORMAL HIGH (ref 70–99)

## 2023-11-08 LAB — CBC
HCT: 32.2 % — ABNORMAL LOW (ref 36.0–46.0)
Hemoglobin: 9.7 g/dL — ABNORMAL LOW (ref 12.0–15.0)
MCH: 28.8 pg (ref 26.0–34.0)
MCHC: 30.1 g/dL (ref 30.0–36.0)
MCV: 95.5 fL (ref 80.0–100.0)
Platelets: 587 10*3/uL — ABNORMAL HIGH (ref 150–400)
RBC: 3.37 MIL/uL — ABNORMAL LOW (ref 3.87–5.11)
RDW: 19.2 % — ABNORMAL HIGH (ref 11.5–15.5)
WBC: 28.2 10*3/uL — ABNORMAL HIGH (ref 4.0–10.5)
nRBC: 0.5 % — ABNORMAL HIGH (ref 0.0–0.2)

## 2023-11-08 LAB — COOXEMETRY PANEL
Carboxyhemoglobin: 1.7 % — ABNORMAL HIGH (ref 0.5–1.5)
Methemoglobin: <0.7 % (ref 0.0–1.5)
O2 Saturation: 61.4 %
Total hemoglobin: 10.1 g/dL — ABNORMAL LOW (ref 12.0–16.0)

## 2023-11-08 MED ORDER — SODIUM CHLORIDE 0.9% FLUSH: 3.0000 mL | INTRAVENOUS | Status: DC | PRN

## 2023-11-08 MED ORDER — ORAL CARE MOUTH RINSE: 15.0000 mL | OROMUCOSAL | Status: DC | PRN

## 2023-11-08 MED ORDER — FUROSEMIDE 10 MG/ML IJ SOLN
80.0000 mg | Freq: Two times a day (BID) | INTRAMUSCULAR | Status: DC
  Administered 2023-11-08: 80 mg via INTRAVENOUS

## 2023-11-08 MED ORDER — SODIUM CHLORIDE 0.9 % IV SOLN: 250.0000 mL | INTRAVENOUS | Status: AC | PRN

## 2023-11-08 MED ORDER — ~~LOC~~ CARDIAC SURGERY, PATIENT & FAMILY EDUCATION: Freq: Once | Status: AC

## 2023-11-08 MED ORDER — POTASSIUM CHLORIDE 10 MEQ/50ML IV SOLN
10.0000 meq | INTRAVENOUS | Status: AC
  Administered 2023-11-08 (×4): 10 meq via INTRAVENOUS
  Filled 2023-11-08 (×3): qty 50

## 2023-11-08 MED ORDER — SODIUM CHLORIDE 0.9% FLUSH
3.0000 mL | Freq: Two times a day (BID) | INTRAVENOUS | Status: DC
  Administered 2023-11-08 – 2023-11-25 (×31): 3 mL via INTRAVENOUS

## 2023-11-08 MED ORDER — INSULIN ASPART 100 UNIT/ML IJ SOLN
0.0000 [IU] | Freq: Three times a day (TID) | INTRAMUSCULAR | Status: DC
  Administered 2023-11-12: 3 [IU] via SUBCUTANEOUS

## 2023-11-08 MED ORDER — FUROSEMIDE 20 MG PO TABS
20.0000 mg | ORAL_TABLET | Freq: Every day | ORAL | Status: DC
  Filled 2023-11-08: qty 1

## 2023-11-08 MED ORDER — FUROSEMIDE 40 MG PO TABS
40.0000 mg | ORAL_TABLET | Freq: Every day | ORAL | Status: DC
  Administered 2023-11-08 – 2023-11-10 (×3): 40 mg via ORAL
  Filled 2023-11-08 (×3): qty 1

## 2023-11-08 MED ORDER — POTASSIUM CHLORIDE 10 MEQ/50ML IV SOLN
10.0000 meq | INTRAVENOUS | Status: AC
  Administered 2023-11-08 (×2): 10 meq via INTRAVENOUS
  Filled 2023-11-08 (×3): qty 50

## 2023-11-08 MED ORDER — POTASSIUM CHLORIDE CRYS ER 20 MEQ PO TBCR
40.0000 meq | EXTENDED_RELEASE_TABLET | Freq: Once | ORAL | Status: AC
  Administered 2023-11-08: 40 meq via ORAL
  Filled 2023-11-08: qty 2

## 2023-11-08 MED ORDER — POTASSIUM CHLORIDE CRYS ER 20 MEQ PO TBCR: 40.0000 meq | EXTENDED_RELEASE_TABLET | ORAL | Status: DC

## 2023-11-08 NOTE — Progress Notes (Signed)
 CPT held due to pt on BIPAP at this time

## 2023-11-08 NOTE — Progress Notes (Signed)

## 2023-11-08 NOTE — Progress Notes (Signed)
 Patient ID: Katelyn Cline, female   DOB: 27-Aug-1960, 64 y.o.   MRN: [id number]  TCTS Evening Rounds:  Hemodynamically stable in sinus rhythm.  Sitting up in chair eating.  UO good.  Had BM today.  Echo done today. Not read yet.

## 2023-11-08 NOTE — Evaluation (Signed)
 Occupational Therapy Evaluation Patient Details Name: Katelyn Cline MRN: [id number] DOB: Mar 16, 1961 Today's Date: 11/08/2023   History of Present Illness   63 y.o. female presented 10/25/23 for the evaluation of unstable angina. 5/7 CABG x3, AVR; extubated 5/11; BiPAP 5/11  PMH- 3v CAD, atrial fibrillation, COPD oxygen  dependent 4L, 100 pack year smoking history, diabetes, HTN, HLD, PAD, moderate aortic stenosis, agoraphobia, fibromyalgia, narcolepsy, peripheral neuropathy     Clinical Impressions Pt was walking with a cane and sitting to shower. She could complete heavy meal prep as recent as Christmastime. She relies on her son for IADLs. Pt presents with generalized weakness, B shoulder discomfort, decreased standing balance and impaired endurance. Pt requires min assist for OOB mobility with RW and set up to moderate assistance for ADLs. Pt has the potential to return to modified independence in self care with intensive rehab. Patient will benefit from intensive inpatient follow-up therapy, >3 hours/day.     If plan is discharge home, recommend the following:   A little help with walking and/or transfers;A lot of help with bathing/dressing/bathroom;Assistance with cooking/housework;Assist for transportation;Help with stairs or ramp for entrance     Functional Status Assessment   Patient has had a recent decline in their functional status and demonstrates the ability to make significant improvements in function in a reasonable and predictable amount of time.     Equipment Recommendations   BSC/3in1     Recommendations for Other Services   Rehab consult     Precautions/Restrictions   Precautions Precautions: Sternal;Fall Precaution Booklet Issued: No Recall of Precautions/Restrictions: Impaired Restrictions Weight Bearing Restrictions Per Provider Order: No     Mobility Bed Mobility Overal bed mobility: Needs Assistance Bed Mobility: Supine to Sit      Supine to sit: Supervision, HOB elevated     General bed mobility comments: from [facility] elevated pt turned to sit EOB and gradually scooted to reach feet to floor ; holding pillow to maintain precautions    Transfers Overall transfer level: Needs assistance Equipment used: Rolling walker (2 wheels) Transfers: Sit to/from Stand Sit to Stand: Min assist, +2 physical assistance, +2 safety/equipment, From elevated surface           General transfer comment: bed elevated; initial attempt unsuccessful; used momentum and hands on knees with success      Balance Overall balance assessment: Needs assistance Sitting-balance support: No upper extremity supported, Feet unsupported Sitting balance-Leahy Scale: Good     Standing balance support: Bilateral upper extremity supported Standing balance-Leahy Scale: Poor                             ADL either performed or assessed with clinical judgement   ADL Overall ADL's : Needs assistance/impaired Eating/Feeding: Sitting;Set up   Grooming: Wash/dry hands;Sitting;Set up (total assist to brush hair)   Upper Body Bathing: Sitting;Moderate assistance   Lower Body Bathing: Sit to/from stand;Moderate assistance   Upper Body Dressing : Moderate assistance;Sitting   Lower Body Dressing: Set up;Sit to/from stand Lower Body Dressing Details (indicate cue type and reason): can perform figure 4, prefers slippers to socks Toilet Transfer: Minimal assistance;Ambulation   Toileting- Clothing Manipulation and Hygiene: Total assistance;Sit to/from stand       Functional mobility during ADLs: Minimal assistance;+2 for safety/equipment;Rolling walker (2 wheels)       Vision Baseline Vision/History: 1 Wears glasses Ability to See in Adequate Light: 0 Adequate Patient Visual Report: No change from baseline  Perception         Praxis         Pertinent Vitals/Pain Pain Assessment Pain Assessment: Faces Faces Pain Scale:  Hurts little more Pain Location: B shoulders Pain Descriptors / Indicators: Discomfort Pain Intervention(s): Monitored during session, Repositioned     Extremity/Trunk Assessment Upper Extremity Assessment Upper Extremity Assessment: Generalized weakness;Right hand dominant   Lower Extremity Assessment Lower Extremity Assessment: Defer to PT evaluation   Cervical / Trunk Assessment Cervical / Trunk Assessment: Other exceptions Cervical / Trunk Exceptions: overweight   Communication Communication Communication: No apparent difficulties   Cognition Arousal: Alert Behavior During Therapy: WFL for tasks assessed/performed Cognition: No apparent impairments                               Following commands: Intact       Cueing  General Comments   Cueing Techniques: Verbal cues  on 3L O2 (as at home) with poor pleth while ambulating. On return to sit continued poor pleth until sitting x 2 minutes and read 95%   Exercises     Shoulder Instructions      Home Living Family/patient expects to be discharged to:: Private residence Living Arrangements: Children (son) Available Help at Discharge: Family;Available PRN/intermittently Type of Home: House Home Access: Stairs to enter [employer] of Steps: 4 Entrance Stairs-Rails: Right;Left Home Layout: One level     Bathroom Shower/Tub: Chief Strategy Officer: Standard     Home Equipment: Cane - single point;Shower seat;Hand held shower head;Other (comment) (O2)          Prior Functioning/Environment Prior Level of Function : Needs assist;Driving             Mobility Comments: using a cane ADLs Comments: son assists with housework Occupational psychologist, groceries, garbage...any heavy work)    OT Problem List: Decreased strength;Decreased activity tolerance;Impaired balance (sitting and/or standing);Decreased coordination;Decreased knowledge of precautions;Pain;Decreased knowledge of use of  DME or AE;Obesity;Impaired UE functional use;Cardiopulmonary status limiting activity   OT Treatment/Interventions: Self-care/ADL training;Energy conservation;DME and/or AE instruction;Therapeutic activities;Patient/family education;Balance training      OT Goals(Current goals can be found in the care plan section)   Acute Rehab OT Goals OT Goal Formulation: With patient Time For Goal Achievement: 11/22/23 Potential to Achieve Goals: Good ADL Goals Pt Will Perform Grooming: with modified independence;standing Pt Will Perform Upper Body Dressing: with modified independence;sitting Pt Will Perform Lower Body Dressing: with modified independence;sit to/from stand Pt Will Transfer to Toilet: with modified independence;ambulating;bedside commode (over toilet) Pt Will Perform Toileting - Clothing Manipulation and hygiene: with modified independence;sit to/from stand Additional ADL Goal #1: Pt will generalize sternal precautions during mobility and ADLs.   OT Frequency:  Min 2X/week    Co-evaluation              AM-PAC OT "6 Clicks" Daily Activity     Outcome Measure Help from another person eating meals?: A Little Help from another person taking care of personal grooming?: A Lot Help from another person toileting, which includes using toliet, bedpan, or urinal?: A Lot Help from another person bathing (including washing, rinsing, drying)?: A Lot Help from another person to put on and taking off regular upper body clothing?: A Lot Help from another person to put on and taking off regular lower body clothing?: A Lot 6 Click Score: 13   End of Session Equipment Utilized During Treatment: Rolling walker (2 wheels);Gait  belt;Oxygen  Nurse Communication: Mobility status  Activity Tolerance: Patient tolerated treatment well Patient left: in chair;with call bell/phone within reach  OT Visit Diagnosis: Unsteadiness on feet (R26.81);Other abnormalities of gait and mobility  (R26.89);Pain;Muscle weakness (generalized) (M62.81);Other (comment) (decreased activity tolerance)                Time: 1610-9604 OT Time Calculation (min): 35 min Charges:  OT General Charges $OT Visit: 1 Visit OT Evaluation $OT Eval Moderate Complexity: 1 Mod Avanell Leigh, OTR/L Acute Rehabilitation Services Office: [phone]   Jonette Nestle 11/08/2023, 10:23 AM

## 2023-11-08 NOTE — Telephone Encounter (Signed)
 Patient's son, Myrtie Atkinson, obtained Jayne's signature; witnessed by her Nurse and paid the $296 form fee.  Form in Dr. Arlester Ladd box.

## 2023-11-08 NOTE — Progress Notes (Signed)
 [address]       [place] [address]             [phone]                 5 Days Post-Op Procedure(s) (LRB): EXPLORATION POST OPERATIVE OPEN HEART; WASHOUT (N/A) ECHOCARDIOGRAM, TRANSESOPHAGEAL (N/A)   Events: No events On bipap overnight _______________________________________________________________ Vitals: BP (!) 118/51   Pulse 65   Temp (!) 96.6 F (35.9 C) (Axillary) Comment: Simultaneous filing. User may not have seen previous data. Comment (Src): Simultaneous filing. User may not have seen previous data.  Resp 14   Ht 5' 8.5" (1.74 m)   Wt 100.5 kg   SpO2 97%   BMI 33.20 kg/m  Filed Weights   11/06/23 0500 11/07/23 0500 11/08/23 0452  Weight: 102.3 kg 99.4 kg 100.5 kg     - Neuro: arousable  - Cardiovascular: sinus  Drips: none.   CVP:  [0 mmHg-23 mmHg] 0 mmHg  - Pulm: EWOB Vent Mode: PCV;BIPAP FiO2 (%):  [40 %] 40 % Set Rate:  [15 bmp] 15 bmp PEEP:  [5 cmH20] 5 cmH20 Pressure Support:  [12 cmH20] 12 cmH20  ABG    Component Value Date/Time   PHART 7.367 11/04/2023 1202   PCO2ART 49.5 (H) 11/04/2023 1202   PO2ART 89 11/04/2023 1202   HCO3 28.4 (H) 11/04/2023 1202   TCO2 30 11/04/2023 1202   ACIDBASEDEF 2.0 11/03/2023 1947   O2SAT 61.4 11/08/2023 0340    - Abd: ND - Extremity: warm  .Intake/Output      05/11 0701 05/12 0700 05/12 0701 05/13 0700   P.O. 480    I.V. (mL/kg)     IV Piggyback 600 50   Total Intake(mL/kg) 1080 (10.7) 50 (0.5)   Urine (mL/kg/hr) 2850 (1.2) 200 (0.4)   Stool 0    Chest Tube 40    Total Output 2890 200   Net -1810 -150        Urine Occurrence 1 x    Stool Occurrence 1 x       _______________________________________________________________ Labs:    Latest Ref Rng & Units 11/08/2023    3:40 AM 11/07/2023    3:45 AM 11/06/2023    4:35 AM  CBC  WBC 4.0 - 10.5 K/uL 28.2  24.5  24.4   Hemoglobin 12.0 - 15.0 g/dL 9.7  8.9  8.7   Hematocrit 36.0 - 46.0 % 32.2  29.6  28.9    Platelets 150 - 400 K/uL 587  376  262       Latest Ref Rng & Units 11/08/2023    3:40 AM 11/08/2023   12:15 AM 11/07/2023    3:45 AM  CMP  Glucose 70 - 99 mg/dL 914  782  956   BUN 8 - 23 mg/dL 19  20  30    Creatinine 0.44 - 1.00 mg/dL 2.13  0.86  5.78   Sodium 135 - 145 mmol/L 143  145  150   Potassium 3.5 - 5.1 mmol/L 3.2  3.3  3.0   Chloride 98 - 111 mmol/L 106  107  112   CO2 22 - 32 mmol/L 29  29  30    Calcium  8.9 - 10.3 mg/dL 8.3  8.2  8.5     CXR: -  _______________________________________________________________  Assessment and Plan: POD 5 s/p AVR CABG  Neuro: pain controlled CV: on A/S/BB Pulm: IS, ambulation.   Renal: creat stable GI: on  diet Heme: stable ID: on abx for thick secretions Endo: SSI Dispo: floor today   Katelyn Cline 11/08/2023 11:49 AM

## 2023-11-08 NOTE — Evaluation (Signed)
 Physical Therapy Evaluation Patient Details Name: Katelyn Cline MRN: [id number] DOB: May 09, 1961 Today's Date: 11/08/2023  History of Present Illness  63 y.o. female presented 10/25/23 for the evaluation of unstable angina. 5/7 CABG x3, AVR; extubated 5/11; BiPAP 5/11  PMH- 3v CAD, atrial fibrillation, COPD oxygen  dependent 4L, 100 pack year smoking history, diabetes, HTN, HLD, PAD, moderate aortic stenosis, agoraphobia, fibromyalgia, narcolepsy, peripheral neuropathy  Clinical Impression   Pt admitted secondary to problem above with deficits below. PTA patient lives with son in one level home with 4 steps to enter with rails. She used a cane to ambulate and son does many of the "heavy chores" Occupational psychologist, grocery shopping, garbage).  Pt currently overall min assist with +2 for safety/equipment. She tolerated 20 ft of ambulation with RW and began to "feel sleepy" with change in demeanor and returned to chair. ?desaturation as pleth not registering and it took 2 minutes seated to get an accurate measurement of 95%.  Anticipate patient will benefit from PT to address problems listed below. Will continue to follow acutely to maximize functional mobility, independence, and safety. Patient will benefit from continued inpatient follow up therapy, >3 hours/day.           If plan is discharge home, recommend the following: Assistance with cooking/housework;Assist for transportation;Help with stairs or ramp for entrance   Can travel by private vehicle        Equipment Recommendations Rolling walker (2 wheels)  Recommendations for Other Services  Rehab consult    Functional Status Assessment Patient has had a recent decline in their functional status and demonstrates the ability to make significant improvements in function in a reasonable and predictable amount of time.     Precautions / Restrictions Precautions Precautions: Sternal Precaution Booklet Issued: No Recall of Precautions/Restrictions:  Impaired Restrictions Weight Bearing Restrictions Per Provider Order: No      Mobility  Bed Mobility Overal bed mobility: Needs Assistance Bed Mobility: Supine to Sit     Supine to sit: Supervision, HOB elevated     General bed mobility comments: from [facility] elevated pt turned to sit EOB and gradually scooted to reach feet to floor ; holding pillow to maintain precautions    Transfers Overall transfer level: Needs assistance Equipment used: Rolling walker (2 wheels) Transfers: Sit to/from Stand Sit to Stand: Min assist, +2 physical assistance, +2 safety/equipment, From elevated surface           General transfer comment: bed elevated; initial attempt unsuccessful; used momentum and hands on knees with success    Ambulation/Gait Ambulation/Gait assistance: Min assist, +2 safety/equipment Gait Distance (Feet): 40 Feet Assistive device: Rolling walker (2 wheels) Gait Pattern/deviations: Step-through pattern, Decreased stride length   Gait velocity interpretation: <1.8 ft/sec, indicate of risk for recurrent falls   General Gait Details: pt required assist to steer/maneuver RW; became quiet with flat demeanor after 20 ft and returned to room; ?oxygen  dropping with poor pleth  Stairs            Wheelchair Mobility     Tilt Bed    Modified Rankin (Stroke Patients Only)       Balance Overall balance assessment: Needs assistance Sitting-balance support: No upper extremity supported, Feet unsupported Sitting balance-Leahy Scale: Good     Standing balance support: Bilateral upper extremity supported Standing balance-Leahy Scale: Poor  Pertinent Vitals/Pain Pain Assessment Pain Assessment: Faces Faces Pain Scale: Hurts a little bit Pain Location: chest Pain Descriptors / Indicators: Discomfort Pain Intervention(s): Limited activity within patient's tolerance, Monitored during session    Home Living Family/patient  expects to be discharged to:: Private residence Living Arrangements: Children (son) Available Help at Discharge: Family;Available PRN/intermittently Type of Home: House Home Access: Stairs to enter Entrance Stairs-Rails: Doctor, general practice of Steps: 4   Home Layout: One level Home Equipment: Cane - single point;Other (comment);Shower seat;Hand held shower head (oxygen )      Prior Function Prior Level of Function : Needs assist;Driving             Mobility Comments: using a cane ADLs Comments: son assists with housework Occupational psychologist, groceries, garbage...any heavy work)     Extremity/Trunk Assessment   Upper Extremity Assessment Upper Extremity Assessment: Defer to OT evaluation    Lower Extremity Assessment Lower Extremity Assessment: Generalized weakness    Cervical / Trunk Assessment Cervical / Trunk Assessment: Other exceptions Cervical / Trunk Exceptions: overweight  Communication   Communication Communication: No apparent difficulties    Cognition Arousal: Alert Behavior During Therapy: WFL for tasks assessed/performed                             Following commands: Intact       Cueing Cueing Techniques: Verbal cues     General Comments General comments (skin integrity, edema, etc.): on 3L O2 (as at home) with poor pleth while ambulating. On return to sit continued poor pleth until sitting x 2 minutes and read 95%    Exercises     Assessment/Plan    PT Assessment Patient needs continued PT services  PT Problem List Decreased strength;Decreased activity tolerance;Decreased balance;Decreased mobility;Decreased knowledge of use of DME;Decreased knowledge of precautions;Cardiopulmonary status limiting activity;Impaired sensation;Obesity;Pain       PT Treatment Interventions DME instruction;Gait training;Stair training;Functional mobility training;Therapeutic activities;Therapeutic exercise;Balance training;Patient/family  education    PT Goals (Current goals can be found in the Care Plan section)  Acute Rehab PT Goals Patient Stated Goal: return home PT Goal Formulation: With patient Time For Goal Achievement: 11/22/23 Potential to Achieve Goals: Good    Frequency Min 2X/week     Co-evaluation               AM-PAC PT "6 Clicks" Mobility  Outcome Measure Help needed turning from your back to your side while in a flat bed without using bedrails?: A Little Help needed moving from lying on your back to sitting on the side of a flat bed without using bedrails?: A Little Help needed moving to and from a bed to a chair (including a wheelchair)?: A Lot Help needed standing up from a chair using your arms (e.g., wheelchair or bedside chair)?: A Lot Help needed to walk in hospital room?: Total Help needed climbing 3-5 steps with a railing? : Total 6 Click Score: 12    End of Session Equipment Utilized During Treatment: Gait belt;Oxygen  Activity Tolerance: Treatment limited secondary to medical complications (Comment) (?desaturation) Patient left: in chair;with call bell/phone within reach Nurse Communication: Mobility status;Other (comment) (slight incontinence of bowels; ?desaturation) PT Visit Diagnosis: Difficulty in walking, not elsewhere classified (R26.2)    Time: 1610-9604 PT Time Calculation (min) (ACUTE ONLY): 34 min   Charges:   PT Evaluation $PT Eval Low Complexity: 1 Low   PT General Charges $$ ACUTE PT VISIT: 1 Visit  Gayle Kava, PT Acute Rehabilitation Services  Office [phone]   Guilford Leep 11/08/2023, 10:12 AM

## 2023-11-08 NOTE — Progress Notes (Addendum)
 Advanced Heart Failure Rounding Note  Cardiologist: Lauro Portal, MD  Chief Complaint: Diastolic Heart Failure Subjective:    POD#5 s/p CABG + AVR + LAA clip  Extubated yesterday. Hypertensive. Net negative 1.8L, although I/Os incomplete. Weight up.  Coox 61%.   Sitting up on the side of the bed, working with PT. No SOB.   Objective:    Weight Range: 100.5 kg Body mass index is 33.2 kg/m.   Vital Signs:   Temp:  [96.6 F (35.9 C)-98.6 F (37 C)] 96.6 F (35.9 C) (05/12 0700) Pulse Rate:  [50-79] 52 (05/12 0700) Resp:  [4-32] 17 (05/12 0700) BP: (116-183)/(43-161) 125/53 (05/12 0700) SpO2:  [90 %-100 %] 97 % (05/12 0700) FiO2 (%):  [40 %] 40 % (05/11 2317) Weight:  [100.5 kg] 100.5 kg (05/12 0452) Last BM Date : 11/07/23  Weight change: Filed Weights   11/06/23 0500 11/07/23 0500 11/08/23 0452  Weight: 102.3 kg 99.4 kg 100.5 kg   Intake/Output:  Intake/Output Summary (Last 24 hours) at 11/08/2023 0850 Last data filed at 11/08/2023 0424 Gross per 24 hour  Intake 1080.01 ml  Output 2890 ml  Net -1809.99 ml    Physical Exam    CVP 8 General: Weak appearing. No distress on [place] Cardiac: JVP difficult to assess. S1 and S2 present. No murmurs or rub. Extremities: Warm and dry.  Trace B/L edema.  Neuro: Alert and oriented x3. Affect pleasant. Lines/Devices:  RIJ CVL     Telemetry   SB in 50s with occasional PAC + PVCs (personally reviewed)  EKG    N/A  Labs    CBC Recent Labs    11/07/23 0345 11/08/23 0340  WBC 24.5* 28.2*  HGB 8.9* 9.7*  HCT 29.6* 32.2*  MCV 97.0 95.5  PLT 376 587*   Basic Metabolic Panel Recent Labs    16/10/96 0015 11/08/23 0340  NA 145 143  K 3.3* 3.2*  CL 107 106  CO2 29 29  GLUCOSE 123* 101*  BUN 20 19  CREATININE 0.49 0.48  CALCIUM  8.2* 8.3*   BNP (last 3 results) Recent Labs    07/16/23 1454  BNP 173.5*    Imaging   No results found.  Medications:    Scheduled Medications:  acetaminophen    1,000 mg Oral Q6H   Or   acetaminophen  (TYLENOL ) oral liquid 160 mg/5 mL  1,000 mg Per Tube Q6H   acetaminophen  (TYLENOL ) oral liquid 160 mg/5 mL  650 mg Per Tube Once   arformoterol   15 mcg Nebulization BID   aspirin   324 mg Oral Once   aspirin  EC  325 mg Oral Daily   Or   aspirin   324 mg Per Tube Daily   atorvastatin   80 mg Oral QHS   bisacodyl   10 mg Oral Daily   Or   bisacodyl   10 mg Rectal Daily   buPROPion  ER  100 mg Oral BID   Chlorhexidine  Gluconate Cloth  6 each Topical Daily   docusate  200 mg Per Tube Daily   DULoxetine   60 mg Oral BID   enoxaparin  (LOVENOX ) injection  40 mg Subcutaneous Q24H   free water  200 mL Per Tube Q4H   furosemide   80 mg Intravenous BID   hydrALAZINE   10 mg Oral Q8H   insulin  aspart  0-20 Units Subcutaneous Q4H   insulin  glargine-yfgn  25 Units Subcutaneous BID   ipratropium-albuterol   3 mL Nebulization Q4H   metoprolol  tartrate  25 mg Oral  BID   mouth rinse  15 mL Mouth Rinse Q2H   pantoprazole  (PROTONIX ) IV  40 mg Intravenous Q12H   revefenacin   175 mcg Nebulization Daily   sodium chloride  flush  3 mL Intravenous Q12H   sodium chloride  flush  3-10 mL Intravenous Q12H   sodium chloride  flush  3-10 mL Intravenous Q12H   sodium chloride  flush  3-10 mL Intravenous Q12H   sodium chloride  HYPERTONIC  4 mL Nebulization Q4H   Infusions:  ceFEPime  (MAXIPIME ) IV Stopped (11/08/23 0006)   potassium chloride  10 mEq (11/08/23 0827)   PRN Medications: hydrALAZINE , ipratropium-albuterol , metoprolol  tartrate, morphine  injection, ondansetron  (ZOFRAN ) IV, mouth rinse, oxyCODONE , sodium chloride  flush, sodium chloride  flush, sodium chloride  flush, sodium chloride  flush, traMADol   Patient Profile   Katelyn Cline is 63 year old with history of CAD, PAF 2018, COPD, PAD, HTN, DMII,metastatic melanoma, wedge resection RUL metastatic melanoma, fibromyalgia, and tobacco abuse. S/P CABG + AVR + LAA clip 5/7.  Assessment/Plan   1. Acute on chronic diastolic  CHF: Post CABG-bioprosthetic AVR.  She was slow to wean from the vent. RV mildly dilated with low normal systolic function. Off pressors. Get post-op echo today - CVP 8. Start Lasix  20 mg daily.  2. Acute hypoxemic respiratory failure: History of COPD and probable OHS with home oxygen  4-5 L/min.   - CCM following.  - Now extubated.  3. CAD: S/p CABG.  On ASA, statin.   4. Moderate AS: S/p bioprosthetic AVR with CABG. Repeat echo today.   5. Atrial fibrillation: H/o PAF, NSR currently.  - Anticipate starting Eliquis  today or tomorrow. Will discuss with TCTS.  6. Leukocytosis: WBC up to 28.  - on cefepime   Anticipate transfer out of the ICU.   [place] Lee, NP 11/08/23  Advanced Heart Failure Team Pager [phone] (M-F; 7a - 5p)  Please contact  Cardiology for night-coverage after hours (4p -7a ) and weekends on [email or web address]  Patient seen and examined with the above-signed Advanced Practice Provider and/or Housestaff. I personally reviewed laboratory data, imaging studies and relevant notes. I independently examined the patient and formulated the important aspects of the plan. I have edited the note to reflect any of my changes or salient points. I have personally discussed the plan with the patient and/or family.  Sitting up in chair. Feeling better. Denies CP or SOB.   Remains in NSR  CVP 8 Co-ox 61%  General:  Sitting up in chair No resp difficulty HEENT: normal Neck: supple. RIJ cath  Cor: sternal wound ok  Regular rate & rhythm. No rubs, gallops or murmurs. Lungs: decreased at bases  Abdomen: obese  soft, nontender, nondistended. No hepatosplenomegaly. No bruits or masses. Good bowel sounds. Extremities: no cyanosis, clubbing, rash, 1-2+  edema Neuro: alert & orientedx3, cranial nerves grossly intact. moves all 4 extremities w/o difficulty. Affect pleasant  Progressing well but still volume overloaded. Woul continue IV diuresis. Awaiting repeat echo.   Ambulate.  Encouraged IS.   Can go to floor. Gen cards to assume cardiology rounding once out of ICU.  Jules Oar, MD  11:20 AM

## 2023-11-08 NOTE — Progress Notes (Signed)
 NAME:  Katelyn Cline, MRN:  [id number], DOB:  [birth year], LOS: 14 ADMISSION DATE:  10/25/2023, CONSULTATION DATE:  5/7 REFERRING MD:  Dr. Deloise Ferries, CHIEF COMPLAINT:  CABG x 3; AVR; [place]  History of Present Illness:  Patient is a 63 yo F w/ pertinent PMH of 3v CAD, moderate aortic stenosis, Afib on apixaban , COPD on 4L [place], 100 pack year history, DMT2, HTN, HLD, PAD presents to [facility] ED on 4/28 w/ unstable angina.  Patient followed by cardiology outpt. Coronary calcium  score on 06/2023 1238 in all 3 coronary arteries. Echo w/ normal systolic function and moderate aortic stenosis. 08/2023 cardaic PET was high risk. Cardiac cath showing 3 vessel disease. Plan to see TCTS for potential CABG/AVR.  On 4/28 having chest pain radiating down to left arm and jaw. Came to [facility] ed. Cards admitted for unstable angina. Started on heparin  iv and asa. Given sublingual nitroglycerin . Trop mildly elevated. Also found to have anemia and fecal occult positive. Started on PPI. GI consulted recommending continuing PPI BID and hold on EGD for now.   Pertinent  Medical History   Past Medical History:  Diagnosis Date   Agoraphobia    Anxiety    Arthritis    "all joints"   Chronic low back pain    Chronic respiratory failure with hypoxia ([name])    4L  via [place],  followed by pcp,   (09-16-2018  per pt only uses while at home and at night ,  portable oxygen  is not working)   Clotting disorder ([name]) 2013   Polycythemia vera   Colon polyps    COPD (chronic obstructive pulmonary disease) ([name])    DDD (degenerative disc disease), lumbosacral    Dental caries    DM type 2 (diabetes mellitus, type 2) ([name])    followed by pcp   Fibromyalgia    GERD (gastroesophageal reflux disease)    occasional , no meds   Heart murmur 2020   History of basal cell carcinoma (BCC) excision    s/p  Moh's of face/ nose 12/ 2013   History of palpitations    per pt found to have occaional PVCs   History of UTI    HTN (hypertension)     Hyperlipidemia    Loose, teeth    Major depression    Metastatic melanoma ([name]) 10/18/2012   12 mm posterior right upper lobe pulmonary nodule, max SUV 3.0  s/p  right wedge resection 10-21-2012,  followed by oncologist-- dr Salomon Cree,  no recurrence   Mixed stress and urge urinary incontinence    Myeloproliferative neoplasm ([name])    Narcolepsy    Obesity    OSA (obstructive sleep apnea)    Oxygen  deficiency    Oxygen  dependent    4 L via [place],  per pt portable oxygen  not working but does use while at home and at night   Panic attacks    Periodontitis    Peripheral neuropathy    feet   Polycythemia vera(238.4) hematology/ oncology-- dr Salomon Cree ([facility])   first dx 02014 due to smoker/copd--  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)  hx phlebotomies   Pulmonary nodules    bilateral small stable per CT 03-11-2018   Scoliosis    Sleep apnea    Smokers' cough ([name])    per pt productive a little in the morning's   Symptoms of upper respiratory infection (URI) 03/12/2018   Urinary (tract) obstruction    Wears glasses  Significant Hospital Events: Including procedures, antibiotic start and stop dates in addition to other pertinent events   4/28 admitted w/ unstable angina; possible GIB 5/7 CABG x 3 w/ AVR; having blood loss requiring multiple PRBCs and FFP; taken back to OR overnight with no source of bleeding; left hemothorax evacuated  Interim History / Subjective:  No overnight issues She was successfully extubated yesterday She was on BiPAP overnight, currently back to 3 L nasal cannula oxygen  which is at baseline  Objective   Blood pressure (!) 125/53, pulse (!) 52, temperature (!) 96.6 F (35.9 C), temperature source Axillary, resp. rate 17, height 5' 8.5" (1.74 m), weight 100.5 kg, SpO2 97%. CVP:  [1 mmHg-30 mmHg] 10 mmHg  Vent Mode: PCV;BIPAP FiO2 (%):  [40 %] 40 % Set Rate:  [15 bmp] 15 bmp PEEP:  [5 cmH20] 5 cmH20 Pressure Support:  [12  cmH20] 12 cmH20   Intake/Output Summary (Last 24 hours) at 11/08/2023 0849 Last data filed at 11/08/2023 0424 Gross per 24 hour  Intake 1080.01 ml  Output 2890 ml  Net -1809.99 ml   Filed Weights   11/06/23 0500 11/07/23 0500 11/08/23 0452  Weight: 102.3 kg 99.4 kg 100.5 kg    Examination: General: Acute on chronically ill-appearing female, lying on the bed HEENT: [place]/AT, eyes anicteric.  moist mucus membranes Neuro: Alert, awake following commands Chest: Central sternotomy incision is clean and dry coarse breath sounds, no wheezes or rhonchi Heart: Regular rate and rhythm, no murmurs or gallops Abdomen: Soft, nontender, nondistended, bowel sounds present  [place] 61% Na+  143 K+ 3.2 BUN 29 Cr 0.48 WBC 28.2 H/H 9.7/32 Platelets 587 Respiratory culture showing rare gram-positive cocci  Resolved Hospital Problem list     Assessment & Plan:  Acute respiratory insufficiency postprocedure Chronic respiratory failure with hypoxia and hypercapnia on home oxygen  COPD, not in exacerbation Probable obstructive sleep apnea Patient was extubated yesterday Required BiPAP overnight, currently on 3 L nasal cannula oxygen  which is at baseline Continue bronchodilators Continue empiric cefepime , though white count is going up, no signs of active infection I think she needs sleep studies as an outpatient  Multivessel coronary artery disease status post CABG x 3  Moderate to severe aortic stenosis status post bioprosthetic AVR, atrial clip, MAZE HTN HLD Continue aspirin  and statin Continue metoprolol  Continue multimodality pain management Blood pressure is better controlled this morning  Acute metabolic encephalopathy, likely sedation related Patient is awake, following commands  Diabetes type 2 Hemoglobin A1c is 5.1 Continue Lantus and sliding scale insulin  with CBG goal 140-180  Tobacco dependence Counseling provided regarding quit smoking   Post-op ABLA, expected H/o  polycythemia vera H/o Anemia due to GIB earlier this admission> did not undergo invasive evaluation due to cardiac instability GI consulted earlier in this admission.  Recommend twice daily PPI and holding EGD for now Continue to hydroxyurea  Monitor H&H and platelet count   PAD with claudication Continue aspirin  and statin Smoking cessation  Chronic Pain Peripheral Neuropathy Depression Resume PTA wellbutrin  when able to take PO meds> can resume today   Hypokalemia Continue aggressive electrolyte replacement and monitor  Obesity Diet and exercise counseling provided   Best Practice (right click and "Reselect all SmartList Selections" daily)   Diet/type: Full liquid diet, advance as tolerated DVT prophylaxis: lovenox  GI prophylaxis: PPI Lines: Central line and Arterial Line, discontinued today Foley:  Yes, and it is still needed, discontinued today Code Status:  full code Last date of multidisciplinary goals of care discussion [  per primary]  Labs   CBC: Recent Labs  Lab 11/04/23 1604 11/05/23 0501 11/06/23 0435 11/07/23 0345 11/08/23 0340  WBC 18.7* 21.0* 24.4* 24.5* 28.2*  HGB 9.4* 8.9* 8.7* 8.9* 9.7*  HCT 29.2* 28.4* 28.9* 29.6* 32.2*  MCV 92.1 93.4 96.7 97.0 95.5  PLT 187 218 262 376 587*    Basic Metabolic Panel: Recent Labs  Lab 11/03/23 2259 11/03/23 2306 11/04/23 0500 11/04/23 1202 11/04/23 1604 11/05/23 0501 11/05/23 1712 11/06/23 0435 11/07/23 0345 11/08/23 0015 11/08/23 0340  NA 142   < > 142   < > 145 146* 146* 150* 150* 145 143  K 3.9   < > 3.7   < > 3.8 3.4* 3.5 3.9 3.0* 3.3* 3.2*  CL 104  --  107  --  107 109 110 114* 112* 107 106  CO2 23  --  26  --  31 29 27 29 30 29 29   GLUCOSE 191*  --  135*  --  119* 113* 107* 142* 134* 123* 101*  BUN 24*  --  28*  --  36* 42* 44* 43* 30* 20 19  CREATININE 0.85  --  0.95  --  1.03* 1.10* 0.97 0.84 0.57 0.49 0.48  CALCIUM  9.5  --  9.3  --  9.0 8.8* 8.8* 8.9 8.5* 8.2* 8.3*  MG 2.4  --  2.2  --  2.2  2.1  --   --   --   --   --    < > = values in this interval not displayed.   GFR: Estimated Creatinine Clearance: 87.8 mL/min (by C-G formula based on SCr of 0.48 mg/dL). Recent Labs  Lab 11/05/23 0501 11/06/23 0435 11/07/23 0345 11/08/23 0340  WBC 21.0* 24.4* 24.5* 28.2*   ABG    Component Value Date/Time   PHART 7.367 11/04/2023 1202   PCO2ART 49.5 (H) 11/04/2023 1202   PO2ART 89 11/04/2023 1202   HCO3 28.4 (H) 11/04/2023 1202   TCO2 30 11/04/2023 1202   ACIDBASEDEF 2.0 11/03/2023 1947   O2SAT 61.4 11/08/2023 0340    CBG: Recent Labs  Lab 11/07/23 1612 11/07/23 1936 11/07/23 2303 11/08/23 0338 11/08/23 0754  GLUCAP 100* 95 132* 93 84      Trevor Fudge, MD [facility] See Amion for pager If no response to pager, please call [phone] until 7pm After 7pm, Please call E-link [phone],

## 2023-11-08 NOTE — Progress Notes (Signed)
  Echocardiogram 2D Echocardiogram has been performed.  Royden Corin 11/08/2023, 2:48 PM

## 2023-11-09 ENCOUNTER — Inpatient Hospital Stay (HOSPITAL_COMMUNITY)

## 2023-11-09 DIAGNOSIS — I200 Unstable angina: Secondary | ICD-10-CM | POA: Diagnosis not present

## 2023-11-09 LAB — GLUCOSE, CAPILLARY
Glucose-Capillary: 41 mg/dL — CL (ref 70–99)
Glucose-Capillary: 65 mg/dL — ABNORMAL LOW (ref 70–99)
Glucose-Capillary: 47 mg/dL — ABNORMAL LOW (ref 70–99)
Glucose-Capillary: 104 mg/dL — ABNORMAL HIGH (ref 70–99)
Glucose-Capillary: 119 mg/dL — ABNORMAL HIGH (ref 70–99)
Glucose-Capillary: 120 mg/dL — ABNORMAL HIGH (ref 70–99)
Glucose-Capillary: 132 mg/dL — ABNORMAL HIGH (ref 70–99)
Glucose-Capillary: 106 mg/dL — ABNORMAL HIGH (ref 70–99)

## 2023-11-09 LAB — BASIC METABOLIC PANEL WITH GFR
Anion gap: 8 (ref 5–15)
BUN: 19 mg/dL (ref 8–23)
CO2: 26 mmol/L (ref 22–32)
Calcium: 8.3 mg/dL — ABNORMAL LOW (ref 8.9–10.3)
Chloride: 103 mmol/L (ref 98–111)
Creatinine, Ser: 0.59 mg/dL (ref 0.44–1.00)
GFR, Estimated: >60 mL/min (ref >60)
Glucose, Bld: 73 mg/dL (ref 70–99)
Potassium: 3.4 mmol/L — ABNORMAL LOW (ref 3.5–5.1)
Sodium: 137 mmol/L (ref 135–145)

## 2023-11-09 LAB — CBC
HCT: 32.2 % — ABNORMAL LOW (ref 36.0–46.0)
Hemoglobin: 9.8 g/dL — ABNORMAL LOW (ref 12.0–15.0)
MCH: 28.8 pg (ref 26.0–34.0)
MCHC: 30.4 g/dL (ref 30.0–36.0)
MCV: 94.7 fL (ref 80.0–100.0)
Platelets: 780 10*3/uL — ABNORMAL HIGH (ref 150–400)
RBC: 3.4 MIL/uL — ABNORMAL LOW (ref 3.87–5.11)
RDW: 19 % — ABNORMAL HIGH (ref 11.5–15.5)
WBC: 23.6 10*3/uL — ABNORMAL HIGH (ref 4.0–10.5)
nRBC: 0.7 % — ABNORMAL HIGH (ref 0.0–0.2)

## 2023-11-09 LAB — CULTURE, RESPIRATORY W GRAM STAIN: Culture: NORMAL

## 2023-11-09 MED ORDER — ACETAMINOPHEN 325 MG PO TABS
650.0000 mg | ORAL_TABLET | Freq: Four times a day (QID) | ORAL | Status: DC | PRN
Start: 2023-11-09 — End: 2023-11-25

## 2023-11-09 MED ORDER — DEXTROSE 50 % IV SOLN
INTRAVENOUS | Status: AC
Start: 2023-11-09 — End: 2023-11-10
  Filled 2023-11-09: qty 50

## 2023-11-09 MED ORDER — POTASSIUM CHLORIDE CRYS ER 20 MEQ PO TBCR
40.0000 meq | EXTENDED_RELEASE_TABLET | Freq: Two times a day (BID) | ORAL | Status: AC
  Administered 2023-11-09 (×2): 40 meq via ORAL
  Filled 2023-11-09 (×2): qty 2

## 2023-11-09 MED ORDER — ASPIRIN 81 MG PO TBEC
81.0000 mg | DELAYED_RELEASE_TABLET | Freq: Every day | ORAL | Status: DC
  Administered 2023-11-10 – 2023-11-25 (×16): 81 mg via ORAL
  Filled 2023-11-09 (×16): qty 1

## 2023-11-09 MED ORDER — ASPIRIN 81 MG PO CHEW
81.0000 mg | CHEWABLE_TABLET | Freq: Every day | ORAL | Status: DC
  Filled 2023-11-09 (×4): qty 1

## 2023-11-09 MED ORDER — INSULIN GLARGINE-YFGN 100 UNIT/ML ~~LOC~~ SOLN
20.0000 [IU] | Freq: Two times a day (BID) | SUBCUTANEOUS | Status: DC
  Administered 2023-11-09: 20 [IU] via SUBCUTANEOUS
  Filled 2023-11-09 (×2): qty 0.2

## 2023-11-09 MED ORDER — APIXABAN 5 MG PO TABS
5.0000 mg | ORAL_TABLET | Freq: Two times a day (BID) | ORAL | Status: DC
  Administered 2023-11-09: 5 mg via ORAL
  Filled 2023-11-09: qty 1

## 2023-11-09 MED ORDER — DEXTROSE 50 % IV SOLN
25.0000 g | INTRAVENOUS | Status: AC
  Administered 2023-11-09: 25 g via INTRAVENOUS

## 2023-11-09 MED ORDER — INSULIN GLARGINE-YFGN 100 UNIT/ML ~~LOC~~ SOLN
16.0000 [IU] | Freq: Two times a day (BID) | SUBCUTANEOUS | Status: DC
  Administered 2023-11-09: 16 [IU] via SUBCUTANEOUS
  Filled 2023-11-09 (×3): qty 0.16

## 2023-11-09 NOTE — NC FL2 (Signed)
 [place]  MEDICAID FL2 LEVEL OF CARE FORM     IDENTIFICATION  Patient Name: Katelyn Cline Birthdate: 1961/05/16 Sex: female Admission Date (Current Location): 10/25/2023  [facility] and [place] Number:  Producer, television/film/video and Address:  [facility], [address], [place], [place] [address]      Provider Number: [id number]  Attending Physician Name and Address:  Hilarie Lovely, MD  Relative Name and Phone Number:  Braleigh, Fo[place] [phone]    Current Level of Care: Hospital Recommended Level of Care: Skilled Nursing Facility Prior Approval Number:    Date Approved/Denied:   PASRR Number: [id number]  Discharge Plan: SNF    Current Diagnoses: Patient Active Problem List   Diagnosis Date Noted   S/P aortic valve replacement with bioprosthetic valve 11/03/2023   S/P CABG (coronary artery bypass graft) 11/03/2023   Unstable angina ([name]) 10/25/2023   Coronary artery disease involving native coronary artery of native heart with unstable angina pectoris ([name]) 10/25/2023   Symptomatic anemia 10/25/2023   Chronic bronchitis ([name]) 10/25/2023   PAD (peripheral artery disease) ([name]) 10/25/2023   Elevated coronary artery calcium  score 09/08/2023   Moderate aortic stenosis 09/08/2023   Claudication in peripheral vascular disease ([name]) 09/08/2023   Hypercoagulable state due to persistent atrial fibrillation ([name]) 06/10/2023   Atrial fibrillation ([name]) 06/08/2023   Vitamin D  deficiency 05/18/2022   MPN (myeloproliferative neoplasm) ([name])    Leukocytosis 03/12/2018   Dental caries 03/12/2018   Aortic atherosclerosis ([name]) 02/21/2018   Constipation 05/28/2015   OE (otitis externa) 01/04/2015   Seborrheic keratoses 08/24/2014   PVC (premature ventricular contraction) 05/18/2014   Type 2 diabetes mellitus with diabetic neuropathy ([name]) 02/06/2013   Chronic hypoxic respiratory failure, on home oxygen  therapy ([name]) 02/06/2013   History of basal  cell carcinoma 12/01/2012   Posttraumatic stress disorder 12/01/2012   History of malignant melanoma 10/18/2012   Polycythemia vera ([name]) 08/05/2012   Hyperlipidemia with target low density lipoprotein (LDL) cholesterol less than 55 mg/dL 84/69/6295   Narcolepsy 04/09/2012   Peripheral neuropathy 04/09/2012   Tobacco use disorder 04/09/2012   Obesity 04/09/2012   Essential hypertension, benign 04/09/2012   DDD (degenerative disc disease), lumbar 02/16/2012   DDD (degenerative disc disease), cervical 02/16/2012   Chronic pain disorder 02/16/2012   Depression 02/16/2012   Fibromyalgia 02/16/2012    Orientation RESPIRATION BLADDER Height & Weight     Self, Time, Situation, Place  O2 Incontinent Weight: 217 lb 2.5 oz (98.5 kg) Height:  5' 8.5" (174 cm)  BEHAVIORAL SYMPTOMS/MOOD NEUROLOGICAL BOWEL NUTRITION STATUS      Continent Diet (See dc summary)  AMBULATORY STATUS COMMUNICATION OF NEEDS Skin   Limited Assist Verbally PU Stage and Appropriate Care (Incision (Closed) 11/03/23 Chest;Incision (Closed) 11/03/23 Leg Right;Incision (Closed) 11/03/23 Chest Other (Comment))                       Personal Care Assistance Level of Assistance  Bathing, Feeding, Dressing Bathing Assistance: Maximum assistance Feeding assistance: Limited assistance Dressing Assistance: Maximum assistance     Functional Limitations Info  Sight, Hearing, Speech Sight Info: Impaired Hearing Info: Impaired Speech Info: Adequate    SPECIAL CARE FACTORS FREQUENCY  PT (By licensed PT), OT (By licensed OT)     PT Frequency: 5x weekly OT Frequency: 5x weekly            Contractures      Additional Factors Info  Code Status, Allergies Code Status  Info: Full Allergies Info: Contrast Media;Erythromycin;Flagyl (metronidazole);Penicillins           Current Medications (11/09/2023):  This is the current hospital active medication list Current Facility-Administered Medications  Medication Dose  Route Frequency Provider Last Rate Last Admin   acetaminophen  (TYLENOL ) 160 MG/5ML solution 650 mg  650 mg Per Tube Once Roddenberry, Myron G, PA-C       acetaminophen  (TYLENOL ) tablet 650 mg  650 mg Oral Q6H PRN Zimmerman, Donielle M, PA-C       arformoterol  (BROVANA ) nebulizer solution 15 mcg  15 mcg Nebulization BID Lightfoot, Harrell O, MD   15 mcg at 11/09/23 0731   [START ON 11/10/2023] aspirin  EC tablet 81 mg  81 mg Oral Daily Zimmerman, Donielle M, PA-C       Or   [START ON 11/10/2023] aspirin  chewable tablet 81 mg  81 mg Per Tube Daily Zimmerman, Donielle M, PA-C       atorvastatin  (LIPITOR) tablet 80 mg  80 mg Oral QHS Roddenberry, Myron G, PA-C   80 mg at 11/08/23 2123   bisacodyl  (DULCOLAX) EC tablet 10 mg  10 mg Oral Daily Roddenberry, Myron G, PA-C   10 mg at 11/09/23 9604   Or   bisacodyl  (DULCOLAX) suppository 10 mg  10 mg Rectal Daily Roddenberry, Myron G, PA-C       buPROPion  ER (WELLBUTRIN  SR) 12 hr tablet 100 mg  100 mg Oral BID Lightfoot, Harrell O, MD   100 mg at 11/09/23 1119   ceFEPIme  (MAXIPIME ) 2 g in sodium chloride  0.9 % 100 mL IVPB  2 g Intravenous Q8H Sheron Dixons, RPH 200 mL/hr at 11/09/23 5409 2 g at 11/09/23 8119   Chlorhexidine  Gluconate Cloth 2 % PADS 6 each  6 each Topical Daily Hilarie Lovely, MD   6 each at 11/08/23 1000   dextrose  50 % solution            docusate (COLACE) 50 MG/5ML liquid 200 mg  200 mg Per Tube Daily Lightfoot, Harrell O, MD   200 mg at 11/09/23 0917   DULoxetine  (CYMBALTA ) DR capsule 60 mg  60 mg Oral BID Lightfoot, Harrell O, MD   60 mg at 11/09/23 0917   furosemide  (LASIX ) tablet 40 mg  40 mg Oral Daily Lightfoot, Harrell O, MD   40 mg at 11/09/23 1478   hydrALAZINE  (APRESOLINE ) injection 10 mg  10 mg Intravenous Q6H PRN Jaquita Merl P, DO   10 mg at 11/07/23 1426   hydrALAZINE  (APRESOLINE ) tablet 10 mg  10 mg Oral Q8H Jaquita Merl P, DO   10 mg at 11/09/23 2956   insulin  aspart (novoLOG ) injection 0-20 Units  0-20 Units  Subcutaneous TID WC Lightfoot, Marinell Siad, MD       insulin  glargine-yfgn (SEMGLEE) injection 16 Units  16 Units Subcutaneous BID Zimmerman, Donielle M, PA-C       ipratropium-albuterol  (DUONEB) 0.5-2.5 (3) MG/3ML nebulizer solution 3 mL  3 mL Nebulization Q4H PRN Payne, John D, PA-C       metoprolol  tartrate (LOPRESSOR ) injection 2.5-5 mg  2.5-5 mg Intravenous Q2H PRN Roddenberry, Myron G, PA-C   5 mg at 11/07/23 1515   metoprolol  tartrate (LOPRESSOR ) tablet 25 mg  25 mg Oral BID Raliegh Burgess, RPH   25 mg at 11/07/23 1617   morphine  (PF) 2 MG/ML injection 1-4 mg  1-4 mg Intravenous Q1H PRN Roddenberry, Myron G, PA-C       ondansetron  (ZOFRAN ) injection 4 mg  4 mg Intravenous Q6H PRN Roddenberry, Myron G, PA-C       Oral care mouth rinse  15 mL Mouth Rinse PRN Lightfoot, Marinell Siad, MD       oxyCODONE  (Oxy IR/ROXICODONE ) immediate release tablet 5-10 mg  5-10 mg Oral Q3H PRN Roddenberry, Myron G, PA-C   10 mg at 11/08/23 2158   pantoprazole  (PROTONIX ) injection 40 mg  40 mg Intravenous Q12H Payne, John D, PA-C   40 mg at 11/09/23 1610   potassium chloride  SA (KLOR-CON  M) CR tablet 40 mEq  40 mEq Oral BID Zimmerman, Donielle M, PA-C   40 mEq at 11/09/23 9604   revefenacin  (YUPELRI ) nebulizer solution 175 mcg  175 mcg Nebulization Daily Payne, John D, PA-C   175 mcg at 11/09/23 0731   sodium chloride  flush (NS) 0.9 % injection 3 mL  3 mL Intravenous Q12H Lightfoot, Marinell Siad, MD   3 mL at 11/09/23 0919   sodium chloride  flush (NS) 0.9 % injection 3 mL  3 mL Intravenous PRN Lightfoot, Marinell Siad, MD       traMADol  (ULTRAM ) tablet 50-100 mg  50-100 mg Oral Q4H PRN Roddenberry, Myron G, PA-C         Discharge Medications: Please see discharge summary for a list of discharge medications.  Relevant Imaging Results:  Relevant Lab Results:   Additional Information SS#: [id number]  Murphy Arn, LCSWA

## 2023-11-09 NOTE — Consult Note (Signed)
 Physical Medicine and Rehabilitation Consult Reason for Consult:CIR/debility Referring Physician: Dr Deloise Ferries   HPI: Katelyn Cline is a 63 y.o. female  with hx of: CAD, O2 dependent COPD- on 3-4L at home; Afib on Eliquis ; PAD with severe B/L SFA disease (ABI's  R 0.53 and L 0.59); DM with A1c of 5.1; hx of metastatic melanoma; s/p RUL wedge resection; Fibromyalgia and 100 pack year smoker with Obesity- BMI of 31.5 admitted   She was admitted 10/25/23- with Chest pain-  with ST diffuse depression in EKG- she underwent work up and found to have 3 vessels that needed CABG_ and underwent CABG of 3 vessels on 11/03/23 with prosthetic AVR and LAA clipping.  Post op- she required multiple units of pRBCs; with bleeding requiring return to the OR.  She also developed CHF decompensation- and required IV Lasix ; she also required vasopressors due to hypotension and had difficulty coming off the vent- finally extubated on 11/07/23.   Since then, there has been question if she has OSA- and she's developed WBC of 28.5- with probable HCAP- her WBC down to 23.6 today on Cefepime  2G q8 hours IV; she's also been restarted on her home Eliquis ; and is down to 3L O2 per chart.    She lives in 1 level house with 1 STE_ her son does housework and gardening, grocery shopping and heavy lifting. She walks usually with a Single point cane and takes a shower sitting - with hand held showerhead.   Her pain has been pretty well controlled last few days- she hasn't taken IV Morphine  since 5/7 nor Tramadol  and last dose of Oxycodone  was yesterday at 10pm. Otherwise, she's taking Tylenol .  However she reports was taking percocet 10/325 mg 5x/day at home.  However is not taking that here- also reports she's in "pain all the time due to pain everywhere"- isnt' able to be clear where her pain is- very vague, but could be confusion/poor memory?.   Her K+ has been an issue, esp being on Lasix - Lasix  was just changed to PO from  IV, so doing better.  Her weight is down to 98.5 kg -it had gotten to a high of 102.9 kg in last 1 week.    LBM was yesterday x2- medium- type 3 and type 5.   Pt reports her cognition is "fine"- but admits her memory isn't the best, since been in hospital for "awhile".  Not able to tell me what day she came in, or today, what day it is, in spite of correct day/date on wall in front of her.  Also didn't remember called son while I was in room with her and was placed 5-10 minutes prior.   Notes only nausea is from pain pills if doesn't eat before taking them   Someone from IM vs CTS came in and noted they will be tapping L lung for moderate effusion on CXR today- today. Holding Eliquis  to do so. Review of Systems  Constitutional:  Positive for chills and malaise/fatigue. Negative for diaphoresis.  HENT: Negative.    Eyes: Negative.   Respiratory:  Positive for cough and shortness of breath.   Cardiovascular:  Positive for claudication and leg swelling. Negative for chest pain.  Gastrointestinal:  Negative for abdominal pain, constipation, diarrhea, nausea and vomiting.  Genitourinary:  Negative for dysuria and urgency.       Using purewick-   Musculoskeletal:  Positive for back pain, joint pain, myalgias and neck pain.  Skin: Negative.  Neurological:  Positive for tingling and weakness.  Psychiatric/Behavioral:  Positive for memory loss.        Admits memory is a little off, being in hospital, but feels everything is "OK"- even though didn't remember called son while I was in room and asked as I left, I wonder where he is"  All other systems reviewed and are negative.  Past Medical History:  Diagnosis Date   Agoraphobia    Anxiety    Arthritis    "all joints"   Chronic low back pain    Chronic respiratory failure with hypoxia ([name])    4L  via [place],  followed by pcp,   (09-16-2018  per pt only uses while at home and at night ,  portable oxygen  is not working)   Clotting disorder  ([name]) 2013   Polycythemia vera   Colon polyps    COPD (chronic obstructive pulmonary disease) ([name])    DDD (degenerative disc disease), lumbosacral    Dental caries    DM type 2 (diabetes mellitus, type 2) ([name])    followed by pcp   Fibromyalgia    GERD (gastroesophageal reflux disease)    occasional , no meds   Heart murmur 2020   History of basal cell carcinoma (BCC) excision    s/p  Moh's of face/ nose 12/ 2013   History of palpitations    per pt found to have occaional PVCs   History of UTI    HTN (hypertension)    Hyperlipidemia    Loose, teeth    Major depression    Metastatic melanoma ([name]) 10/18/2012   12 mm posterior right upper lobe pulmonary nodule, max SUV 3.0  s/p  right wedge resection 10-21-2012,  followed by oncologist-- dr Salomon Cree,  no recurrence   Mixed stress and urge urinary incontinence    Myeloproliferative neoplasm ([name])    Narcolepsy    Obesity    OSA (obstructive sleep apnea)    Oxygen  deficiency    Oxygen  dependent    4 L via [place],  per pt portable oxygen  not working but does use while at home and at night   Panic attacks    Periodontitis    Peripheral neuropathy    feet   Polycythemia vera(238.4) hematology/ oncology-- dr Salomon Cree ([facility])   first dx 02014 due to smoker/copd--  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)  hx phlebotomies   Pulmonary nodules    bilateral small stable per CT 03-11-2018   Scoliosis    Sleep apnea    Smokers' cough ([name])    per pt productive a little in the morning's   Symptoms of upper respiratory infection (URI) 03/12/2018   Urinary (tract) obstruction    Wears glasses    Past Surgical History:  Procedure Laterality Date   ABDOMINAL HYSTERECTOMY  1998   partial   AORTIC VALVE REPLACEMENT N/A 11/03/2023   Procedure: REPLACEMENT, AORTIC VALVE, OPEN USING INSPIRIS RESILIA AORTIC VALVE;  Surgeon: Hilarie Lovely, MD;  Location: [facility] OR;  Service: Open Heart Surgery;  Laterality: N/A;    BREAST LUMPECTOMY Bilateral 1995   benign per pt   CARDIOVERSION N/A 09/06/2023   Procedure: CARDIOVERSION;  Surgeon: Luana Rumple, MD;  Location: [facility] INVASIVE CV LAB;  Service: Cardiovascular;  Laterality: N/A;   CLIPPING OF ATRIAL APPENDAGE N/A 11/03/2023   Procedure: CLIPPING, LEFT ATRIAL APPENDAGE USING ATRICURE ATRICLIP;  Surgeon: Hilarie Lovely, MD;  Location: [facility] OR;  Service: Open Heart Surgery;  Laterality: N/A;   COLONOSCOPY W/ BIOPSIES AND POLYPECTOMY     Hx: of   CORONARY ARTERY BYPASS GRAFT N/A 11/03/2023   Procedure: CORONARY ARTERY BYPASS GRAFTING X 3, USING LEFT INTERNAL MAMMARY ARTERY AND RIGHT ENDOSCOPIC HARVESTED GREATER SAPHENOUS VEIN;  Surgeon: Hilarie Lovely, MD;  Location: [facility] OR;  Service: Open Heart Surgery;  Laterality: N/A;   CYSTECTOMY  2000   abdominal wall    DILATION AND CURETTAGE OF UTERUS  yrs ago   EXPLORATION POST OPERATIVE OPEN HEART N/A 11/03/2023   Procedure: EXPLORATION POST OPERATIVE OPEN HEART; WASHOUT;  Surgeon: Hilarie Lovely, MD;  Location: [facility];  Service: Open Heart Surgery;  Laterality: N/A;   MOHS SURGERY  2013   nose/face   MULTIPLE EXTRACTIONS WITH ALVEOLOPLASTY N/A 09/19/2018   Procedure: Extraction of tooth #'s 2, 3, 6-14, 18, and 20-30 with alveoloplasty;  Surgeon: Kearsten Chroman, DDS;  Location: [facility] ORS;  Service: Oral Surgery;  Laterality: N/A;  GENERAL WITH NASAL TUBE   RIGHT/LEFT HEART CATH AND CORONARY ANGIOGRAPHY N/A 10/01/2023   Procedure: RIGHT/LEFT HEART CATH AND CORONARY ANGIOGRAPHY;  Surgeon: Arleen Lacer, MD;  Location: [facility] INVASIVE CV LAB;  Service: Cardiovascular;  Laterality: N/A;   TEE WITHOUT CARDIOVERSION N/A 11/03/2023   Procedure: ECHOCARDIOGRAM, TRANSESOPHAGEAL;  Surgeon: Hilarie Lovely, MD;  Location: [facility];  Service: Open Heart Surgery;  Laterality: N/A;   TEE WITHOUT CARDIOVERSION N/A 11/03/2023   Procedure: ECHOCARDIOGRAM, TRANSESOPHAGEAL;  Surgeon: Hilarie Lovely, MD;  Location: [facility];   Service: Open Heart Surgery;  Laterality: N/A;   VIDEO ASSISTED THORACOSCOPY (VATS)/WEDGE RESECTION Right 10/21/2012   Procedure: VIDEO ASSISTED THORACOSCOPY (VATS)/WEDGE RESECTION;  Surgeon: Norita Beauvais, MD;  Location: [facility] OR;  Service: Thoracic;  Laterality: Right;   VIDEO BRONCHOSCOPY N/A 10/21/2012   Procedure: VIDEO BRONCHOSCOPY;  Surgeon: Norita Beauvais, MD;  Location: [facility] OR;  Service: Thoracic;  Laterality: N/A;   Family History  Problem Relation Age of Onset   Arthritis Mother    Hyperlipidemia Mother    Depression Mother    Anxiety disorder Mother    Dementia Mother    Hypertension Father    Hyperlipidemia Father    Heart disease Father    Stroke Father    Dementia Father    Heart disease Brother    ADD / ADHD Son    Alcohol  abuse Maternal Grandfather    Bipolar disorder Neg Hx    Drug abuse Neg Hx    OCD Neg Hx    Paranoid behavior Neg Hx    Schizophrenia Neg Hx    Seizures Neg Hx    Sexual abuse Neg Hx    Physical abuse Neg Hx    Social History:  reports that she has been smoking cigarettes. She has a 25 pack-year smoking history. She has never used smokeless tobacco. She reports current alcohol  use of about 1.0 standard drink of alcohol  per week. She reports that she does not use drugs. Allergies:  Allergies  Allergen Reactions   Contrast Media [Iodinated Contrast Media] Anaphylaxis    CAT Scan Conrast    Erythromycin     Stomach pain   Flagyl [Metronidazole] Other (See Comments)    Generalized pain, caused sickness   Other     Iodine Dye   Penicillins Other (See Comments)    Patient was an infant, no idea of reaction. Tolerates Keflex . Did it involve swelling of the face/tongue/throat, SOB, or low BP? Unknown Did it involve sudden  or severe rash/hives, skin peeling, or any reaction on the inside of your mouth or nose? Unknown Did you need to seek medical attention at a hospital or doctor's office? Unknown When did it last happen?      infant If all  above answers are "NO", may proceed with cephalosporin use. CHILDHOOD ALLERGY    Tetracyclines & Related Other (See Comments)    GI side effects   Medications Prior to Admission  Medication Sig Dispense Refill   albuterol  (VENTOLIN  HFA) 108 (90 Base) MCG/ACT inhaler Inhale 2 puffs into the lungs every 6 (six) hours as needed for wheezing or shortness of breath (as needed). 17 each 3   aspirin  EC 81 MG tablet Take 1 tablet (81 mg total) by mouth daily. Swallow whole. 100 tablet 4   atorvastatin  (LIPITOR) 80 MG tablet Take 1 tablet by mouth daily at bedtime. 90 tablet 0   buPROPion  ER (WELLBUTRIN  SR) 100 MG 12 hr tablet Take 1 tablet by mouth every morning and take 1 tablet by mouth every day at bedtime. 180 tablet 0   diltiazem  (CARDIZEM  CD) 120 MG 24 hr capsule Take 1 capsule (120 mg total) by mouth daily. (Patient taking differently: Take 120 mg by mouth at bedtime.) 90 capsule 0   ELIQUIS  5 MG TABS tablet Take 1 tablet (5 mg total) by mouth 2 (two) times daily. 180 tablet 0   ezetimibe  (ZETIA ) 10 MG tablet Take 1 tablet by mouth every morning. 90 tablet 0   fenofibrate  (TRICOR ) 145 MG tablet Take 1 tablet by mouth daily at bedtime. 90 tablet 0   fish oil-omega-3 fatty acids  1000 MG capsule Take 2 capsules (2 g total) by mouth 2 (two) times daily. 180 capsule 1   furosemide  (LASIX ) 20 MG tablet Take 1 tablet (20 mg total) by mouth daily as needed. 30 tablet 3   hydrochlorothiazide  (HYDRODIURIL ) 25 MG tablet Take 1 tablet by mouth every morning. 90 tablet 0   hydroxyurea  (HYDREA ) 500 MG capsule Take 2 capsules by mouth daily at bedtime with an additional 1 capsule on Monday and Thursday. (Patient taking differently: Take 1,000-1,500 mg by mouth See admin instructions. Take 2 capsules at daily at bedtime with 1 additional capsule on Mon, Wed, Fri nigtht) 90 capsule 2   isosorbide  mononitrate (IMDUR ) 30 MG 24 hr tablet Take 1 tablet (30 mg total) by mouth at bedtime. 30 tablet 3   lisinopril   (ZESTRIL ) 5 MG tablet Take 1 tablet by mouth daily at bedtime. 90 tablet 0   metFORMIN  (GLUCOPHAGE ) 1000 MG tablet Take 1 tablet by mouth every morning and take 1 tablet by mouth every day at bedtime. 180 tablet 0   nitroGLYCERIN  (NITROSTAT ) 0.4 MG SL tablet Place 1 tablet (0.4 mg total) under the tongue every 5 (five) minutes as needed for chest pain (up to 3 doses. If taking 3rd dose call 911). 25 tablet 3   nystatin  (MYCOSTATIN /NYSTOP ) powder Apply 1 Application topically 3 (three) times daily. PA Case: [id number] Approved 06/30/2019- 06/28/2020 (Patient taking differently: Apply 1 Application topically 3 (three) times daily as needed (irritation). PA Case: [id number] Approved 06/30/2019- 06/28/2020) 60 g 2   nystatin  cream (MYCOSTATIN ) Apply topically twice daily. (Patient taking differently: Apply 1 Application topically 2 (two) times daily as needed for dry skin.) 60 g 1   oxyCODONE -acetaminophen  (PERCOCET) 10-325 MG tablet Take 1 tablet by mouth every 4 (four) hours as needed for severe pain (pain score 7-10).     potassium chloride  SA (KLOR-CON   M) 20 MEQ tablet Take 1 tablet (20 mEq total) by mouth daily as needed. Take if take furosemide  30 tablet 3   senna (SENOKOT) 8.6 MG TABS tablet Take 2 tablets by mouth daily as needed for mild constipation.     Blood Glucose Monitoring Suppl (BLOOD GLUCOSE SYSTEM PAK) KIT Please dispense based on patient and insurance preference. Use as directed to monitor FSBS 2x daily. Dx: E11.9 1 kit 1   glucose blood (ACCU-CHEK GUIDE) test strip Use as instructed 100 each 12   Glucose Blood (BLOOD GLUCOSE TEST STRIPS) STRP Please dispense based on patient and insurance preference. Use as directed to monitor FSBS 2x daily. Dx: E11.9 100 strip 1   Microlet Lancets MISC Please dispense based on patient and insurance preference. Use as directed to monitor FSBS 2x daily. Dx: E11.9 100 each 11    Home: Home Living Family/patient expects to be discharged to:: Private  residence Living Arrangements: Children (son) Available Help at Discharge: Family, Available PRN/intermittently Type of Home: House Home Access: Stairs to enter [employer] of Steps: 4 Entrance Stairs-Rails: Right, Left Home Layout: One level Bathroom Shower/Tub: Engineer, manufacturing systems: Standard Home Equipment: Cane - single point, Shower seat, Hand held shower head, Other (comment) (O2)  Functional History: Prior Function Prior Level of Function : Needs assist, Driving Mobility Comments: using a cane ADLs Comments: son assists with housework Occupational psychologist, groceries, garbage...any heavy work) Functional Status:  Mobility: Bed Mobility Overal bed mobility: Needs Assistance Bed Mobility: Supine to Sit Supine to sit: Supervision, HOB elevated General bed mobility comments: from [facility] elevated pt turned to sit EOB and gradually scooted to reach feet to floor ; holding pillow to maintain precautions Transfers Overall transfer level: Needs assistance Equipment used: Rolling walker (2 wheels) Transfers: Sit to/from Stand Sit to Stand: Min assist, +2 physical assistance, +2 safety/equipment, From elevated surface General transfer comment: bed elevated; initial attempt unsuccessful; used momentum and hands on knees with success Ambulation/Gait Ambulation/Gait assistance: Min assist, +2 safety/equipment Gait Distance (Feet): 40 Feet Assistive device: Rolling walker (2 wheels) Gait Pattern/deviations: Step-through pattern, Decreased stride length General Gait Details: pt required assist to steer/maneuver RW; became quiet with flat demeanor after 20 ft and returned to room; ?oxygen  dropping with poor pleth Gait velocity interpretation: <1.8 ft/sec, indicate of risk for recurrent falls    ADL: ADL Overall ADL's : Needs assistance/impaired Eating/Feeding: Sitting, Set up Grooming: Wash/dry hands, Sitting, Set up (total assist to brush hair) Upper Body Bathing: Sitting,  Moderate assistance Lower Body Bathing: Sit to/from stand, Moderate assistance Upper Body Dressing : Moderate assistance, Sitting Lower Body Dressing: Set up, Sit to/from stand Lower Body Dressing Details (indicate cue type and reason): can perform figure 4, prefers slippers to socks Toilet Transfer: Minimal assistance, Ambulation Toileting- Clothing Manipulation and Hygiene: Total assistance, Sit to/from stand Functional mobility during ADLs: Minimal assistance, +2 for safety/equipment, Rolling walker (2 wheels)  Cognition: Cognition Orientation Level: Oriented X4 Cognition Arousal: Alert Behavior During Therapy: WFL for tasks assessed/performed  Blood pressure (!) 118/53, pulse (!) 58, temperature 97.9 F (36.6 C), temperature source Oral, resp. rate 19, height 5' 8.5" (1.74 m), weight 98.5 kg, SpO2 99%. Physical Exam Vitals and nursing note reviewed.  Constitutional:      General: She is not in acute distress.    Appearance: She is obese. She is ill-appearing.     Comments: Pt sitting up in bedside chair; awake, alert, but vague, NAD-   HENT:     Head:  Normocephalic and atraumatic.     Comments: Face symmetrical     Nose: Nose normal. No congestion.     Mouth/Throat:     Mouth: Mucous membranes are dry.     Pharynx: Oropharynx is clear.     Comments: Kept sipping pepsi Eyes:     General:        Right eye: No discharge.        Left eye: No discharge.     Extraocular Movements: Extraocular movements intact.  Cardiovascular:     Rate and Rhythm: Regular rhythm. Bradycardia present.     Comments: Rate in high 50's- aortic murmur 3/6 Pulmonary:     Effort: No respiratory distress.     Breath sounds: Wheezing and rhonchi present.     Comments: Increased work of breathing sitting at rest Rate in low 20's Decreased dramatically on L lower base and RUL less as well  A few wheezes and notable rhonchi- rare but heard 3L O2 which is home O2 level- by [place]- sats 99% Abdominal:      General: Bowel sounds are normal. There is no distension.     Palpations: Abdomen is soft.     Tenderness: There is no abdominal tenderness.  Genitourinary:    Comments: Purewick in place Musculoskeletal:     Cervical back: Neck supple.     Comments: 4+ to 5-/5 throughout in Ue's- slightly less so in Deltoids and biceps/triceps than distally LE's- 4+/5 in HF, KE, KF, and in DF/PF- throughout B/L  Skin:    General: Skin is warm and dry.     Comments: 2 IV's in L forearm Sternal incision looks good- thin- scabbed over R inner medial calf incision with sutures- looks good/healing.  Dressing R neck from presumed CVL?   Neurological:     Mental Status: She is alert.     Comments: called son abruptly while I was examining her- to ask where he was? And then didn't remember she called him/when he would be there 5-10 minutes later  Decreased to light touch before knees B/L Knew was at [facility], but initially said was 2005 and needed cues to recognize was 2025- knew was May 2025 eventually, but was not able ot name day or date in spite of being in front of her on wall Memory- 1/3 with cues after 5 minutes Naming intact repetition intact No insight into memory issues   Psychiatric:     Comments: Flat affect, vague; not able to expound on pain issues- and got frustrated when asked the questions- slightly anxious/depressed affect     Results for orders placed or performed during the hospital encounter of 10/25/23 (from the past 24 hours)  Glucose, capillary     Status: None   Collection Time: 11/08/23  4:29 PM  Result Value Ref Range   Glucose-Capillary 98 70 - 99 mg/dL  Basic metabolic panel     Status: Abnormal   Collection Time: 11/08/23  4:36 PM  Result Value Ref Range   Sodium 140 135 - 145 mmol/L   Potassium 4.0 3.5 - 5.1 mmol/L   Chloride 105 98 - 111 mmol/L   CO2 28 22 - 32 mmol/L   Glucose, Bld 110 (H) 70 - 99 mg/dL   BUN 20 8 - 23 mg/dL   Creatinine, Ser 2.13 0.44 - 1.00 mg/dL    Calcium  8.1 (L) 8.9 - 10.3 mg/dL   GFR, Estimated >08 >65 mL/min   Anion gap 7 5 - 15  Glucose,  capillary     Status: Abnormal   Collection Time: 11/08/23  7:43 PM  Result Value Ref Range   Glucose-Capillary 136 (H) 70 - 99 mg/dL  Basic metabolic panel     Status: Abnormal   Collection Time: 11/09/23  4:25 AM  Result Value Ref Range   Sodium 137 135 - 145 mmol/L   Potassium 3.4 (L) 3.5 - 5.1 mmol/L   Chloride 103 98 - 111 mmol/L   CO2 26 22 - 32 mmol/L   Glucose, Bld 73 70 - 99 mg/dL   BUN 19 8 - 23 mg/dL   Creatinine, Ser 3.24 0.44 - 1.00 mg/dL   Calcium  8.3 (L) 8.9 - 10.3 mg/dL   GFR, Estimated >40 >10 mL/min   Anion gap 8 5 - 15  CBC     Status: Abnormal   Collection Time: 11/09/23  4:25 AM  Result Value Ref Range   WBC 23.6 (H) 4.0 - 10.5 K/uL   RBC 3.40 (L) 3.87 - 5.11 MIL/uL   Hemoglobin 9.8 (L) 12.0 - 15.0 g/dL   HCT 27.2 (L) 53.6 - 64.4 %   MCV 94.7 80.0 - 100.0 fL   MCH 28.8 26.0 - 34.0 pg   MCHC 30.4 30.0 - 36.0 g/dL   RDW 03.4 (H) 74.2 - 59.5 %   Platelets 780 (H) 150 - 400 K/uL   nRBC 0.7 (H) 0.0 - 0.2 %  Glucose, capillary     Status: Abnormal   Collection Time: 11/09/23  6:20 AM  Result Value Ref Range   Glucose-Capillary 41 (LL) 70 - 99 mg/dL  Glucose, capillary     Status: Abnormal   Collection Time: 11/09/23 11:12 AM  Result Value Ref Range   Glucose-Capillary 65 (L) 70 - 99 mg/dL  Glucose, capillary     Status: Abnormal   Collection Time: 11/09/23 11:58 AM  Result Value Ref Range   Glucose-Capillary 47 (L) 70 - 99 mg/dL  Glucose, capillary     Status: Abnormal   Collection Time: 11/09/23 12:40 PM  Result Value Ref Range   Glucose-Capillary 104 (H) 70 - 99 mg/dL   ECHOCARDIOGRAM COMPLETE Result Date: 11/08/2023    ECHOCARDIOGRAM REPORT   Patient Name:   Katelyn Cline Date of Exam: 11/08/2023 Medical Rec #:  [id number]        Height:       68.5 in Accession #:    [id number]       Weight:       221.6 lb Date of Birth:  1961/05/27        BSA:           2.146 m Patient Age:    63 years         BP:           121/48 mmHg Patient Gender: F                HR:           62 bpm. Exam Location:  Inpatient Procedure: 2D Echo, Cardiac Doppler and Color Doppler (Both Spectral and Color            Flow Doppler were utilized during procedure). Indications:    I50.40* Unspecified combined systolic (congestive) and diastolic                 (congestive) heart failure  History:        Patient has prior history of Echocardiogram examinations, most  recent 07/19/2023. CAD, Abnormal ECG and Prior CABG, Aortic Valve                 Disease, Arrythmias:Atrial Fibrillation, Signs/Symptoms:Dyspnea;                 Risk Factors:Dyslipidemia, Diabetes and Current Smoker. AVR.                 Aortic Valve: 23 mm Edwards Inspiris Resilia valve is present in                 the aortic position.  Sonographer:    Raynelle Callow RDCS Referring Phys: [place] LEE  Sonographer Comments: Technically difficult study due to poor echo windows. Image acquisition challenging due to patient body habitus. IMPRESSIONS  1. Left ventricular ejection fraction, by estimation, is 60 to 65%. The left ventricle has normal function. The left ventricle has no regional wall motion abnormalities. There is mild concentric left ventricular hypertrophy. Left ventricular diastolic parameters are consistent with Grade II diastolic dysfunction (pseudonormalization).  2. D-shaped septum suggestive of RV pressure/volume overload. Right ventricular systolic function is mildly reduced. The right ventricular size is normal. Tricuspid regurgitation signal is inadequate for assessing PA pressure.  3. Left atrial size was moderately dilated.  4. Right atrial size was mildly dilated.  5. The mitral valve is normal in structure. Trivial mitral valve regurgitation. No evidence of mitral stenosis. Moderate mitral annular calcification.  6. There was a bioprosthetic aortic valve. Mean gradient mildly elevated at 17 mmHg. No  perivalvular regurgitation noted.  7. The inferior vena cava is dilated in size with >50% respiratory variability, suggesting right atrial pressure of 8 mmHg.  8. A small pericardial effusion is present. FINDINGS  Left Ventricle: Left ventricular ejection fraction, by estimation, is 60 to 65%. The left ventricle has normal function. The left ventricle has no regional wall motion abnormalities. The left ventricular internal cavity size was normal in size. There is  mild concentric left ventricular hypertrophy. Left ventricular diastolic parameters are consistent with Grade II diastolic dysfunction (pseudonormalization). Right Ventricle: D-shaped septum suggestive of RV pressure/volume overload. The right ventricular size is normal. No increase in right ventricular wall thickness. Right ventricular systolic function is mildly reduced. Tricuspid regurgitation signal is inadequate for assessing PA pressure. Left Atrium: Left atrial size was moderately dilated. Right Atrium: Right atrial size was mildly dilated. Pericardium: A small pericardial effusion is present. Mitral Valve: The mitral valve is normal in structure. Moderate mitral annular calcification. Trivial mitral valve regurgitation. No evidence of mitral valve stenosis. MV peak gradient, 9.8 mmHg. The mean mitral valve gradient is 3.5 mmHg. Tricuspid Valve: The tricuspid valve is normal in structure. Tricuspid valve regurgitation is trivial. Aortic Valve: There was a bioprosthetic aortic valve. Mean gradient mildly elevated at 17 mmHg. No perivalvular regurgitation noted. The aortic valve has been repaired/replaced. Aortic valve regurgitation is not visualized. Aortic valve mean gradient measures 17.0 mmHg. Aortic valve peak gradient measures 25.7 mmHg. Aortic valve area, by VTI measures 2.22 cm. There is a 23 mm Edwards Inspiris Resilia valve present in the aortic position. Pulmonic Valve: The pulmonic valve was normal in structure. Pulmonic valve  regurgitation is trivial. Aorta: The aortic root is normal in size and structure. Venous: The inferior vena cava is dilated in size with greater than 50% respiratory variability, suggesting right atrial pressure of 8 mmHg. IAS/Shunts: No atrial level shunt detected by color flow Doppler.  LEFT VENTRICLE PLAX 2D LVIDd:  4.90 cm      Diastology LVIDs:         3.10 cm      LV e' medial:    6.09 cm/s LV PW:         1.50 cm      LV E/e' medial:  25.0 LV IVS:        1.50 cm      LV e' lateral:   7.62 cm/s LVOT diam:     2.10 cm      LV E/e' lateral: 19.9 LV SV:         115 LV SV Index:   54 LVOT Area:     3.46 cm  LV Volumes (MOD) LV vol d, MOD A2C: 138.0 ml LV vol d, MOD A4C: 49.9 ml LV vol s, MOD A2C: 21.2 ml LV vol s, MOD A4C: 17.5 ml LV SV MOD A2C:     116.8 ml LV SV MOD A4C:     49.9 ml LV SV MOD BP:      69.0 ml RIGHT VENTRICLE            IVC RV S prime:     4.68 cm/s  IVC diam: 2.10 cm TAPSE (M-mode): 0.8 cm LEFT ATRIUM             Index        RIGHT ATRIUM           Index LA diam:        4.90 cm 2.28 cm/m   RA Area:     19.50 cm LA Vol (A2C):   94.7 ml 44.12 ml/m  RA Volume:   57.80 ml  26.93 ml/m LA Vol (A4C):   72.9 ml 33.97 ml/m LA Biplane Vol: 86.5 ml 40.30 ml/m  AORTIC VALVE AV Area (Vmax):    1.97 cm AV Area (Vmean):   1.94 cm AV Area (VTI):     2.22 cm AV Vmax:           253.40 cm/s AV Vmean:          168.000 cm/s AV VTI:            0.520 m AV Peak Grad:      25.7 mmHg AV Mean Grad:      17.0 mmHg LVOT Vmax:         144.00 cm/s LVOT Vmean:        94.100 cm/s LVOT VTI:          0.333 m LVOT/AV VTI ratio: 0.64  AORTA Ao Root diam: 3.80 cm Ao Asc diam:  3.80 cm MITRAL VALVE MV Area (PHT): 2.97 cm     SHUNTS MV Area VTI:   2.17 cm     Systemic VTI:  0.33 m MV Peak grad:  9.8 mmHg     Systemic Diam: 2.10 cm MV Mean grad:  3.5 mmHg MV Vmax:       1.56 m/s MV Vmean:      85.7 cm/s MV Decel Time: 256 msec MV E velocity: 152.00 cm/s MV A velocity: 113.00 cm/s MV E/A ratio:  1.35 Dalton McleanMD  Electronically signed by Archer Bear Signature Date/Time: 11/08/2023/7:19:45 PM    Final    DG Chest Port 1 View Result Date: 11/08/2023 CLINICAL DATA:  Aortic valve replacement. EXAM: PORTABLE CHEST 1 VIEW COMPARISON:  One-view chest x-ray 11/07/2023. FINDINGS: Heart is enlarged. The sternotomy, aortic valve and atrial clip again noted. Atherosclerotic changes are present at the aortic  arch. Right IJ sheath is in place. Patient has been extubated. Mediastinal and pleural drains were removed. Aeration continues to improve. A left pleural effusion and left basilar airspace disease is again noted. IMPRESSION: 1. Interval extubation and removal of mediastinal and pleural drains. 2. Continued improvement in aeration. 3. Persistent left pleural effusion and left basilar airspace disease. Electronically Signed   By: Audree Leas M.D.   On: 11/08/2023 08:56     Assessment/Plan: Diagnosis: Debility due to CABG x3 and AVR Does the need for close, 24 hr/day medical supervision in concert with the patient's rehab needs make it unreasonable for this patient to be served in a less intensive setting? Yes Co-Morbidities requiring supervision/potential complications: agoraphobix, neurppathy; Severe O2 dep COPD; smoker; Afib on Eliquis ; Severe SFA disease/PAD; Dm A1c 5.1; s/p RUL wedge resection; Fibromyaligia; Obesity; ABLA;  Due to bladder management, bowel management, safety, skin/wound care, disease management, medication administration, pain management, and patient education, does the patient require 24 hr/day rehab nursing? Yes Does the patient require coordinated care of a physician, rehab nurse, therapy disciplines of PT, SLP  and OT to address physical and functional deficits in the context of the above medical diagnosis(es)? Yes Addressing deficits in the following areas: balance, endurance, locomotion, strength, transferring, bowel/bladder control, bathing, dressing, feeding, grooming, and  toileting- also memory, cognition with SLP Can the patient actively participate in an intensive therapy program of at least 3 hrs of therapy per day at least 5 days per week? Yes The potential for patient to make measurable gains while on inpatient rehab is good Anticipated functional outcomes upon discharge from inpatient rehab are modified independent and supervision  with PT, modified independent and supervision with OT, Supervision ot min A with SLP due to memory. Estimated rehab length of stay to reach the above functional goals is:10-14 days  Anticipated discharge destination: Home Overall Rehab/Functional Prognosis: good  RECOMMENDATIONS: This patient's condition is appropriate for continued rehabilitative care in the following setting: CIR Patient has agreed to participate in recommended program. Yes Note that insurance prior authorization may be required for reimbursement for recommended care.  Suggest decreasing Oxycodone  to q4 hours from q3 hours prn- pt said she took Percocet 10/325 mg 5x/day at home via [place], but wanted ot move from this practice- wasn't able to explain why- however I'm concerned about her getting this dose due to impaired memory/cognition noted on exam Continue Tramadol   but I suggest scheduling it 50 mg q6hours to get a more stable control of pain, so she would need oxy less. That might reduce cognitive issues, but of note, hasn't taken Oxy since last night and hasn't taken Tramadol  since 5/7- nor IV morphine  since 5/7- she says takes for "all over pain" at home- the Percocet, however she's doing OK without such a frequent dose here in hospital- not sure why? Suggest a SLP consult for cognition- to work this up-not sure if due to underlying Infection with WBC of 23k, or due to CABG surgery or any other cause? Getting L lung effusion tapped today/tomorrow- holding Eliquis  for tap. Per primary team.  Will submit to admissions coordinator for insurance approval- will  need all 3 therapies based on exam today.  Patient is a good candidate for CIR due to need for all 3 therapies due to debility.  Thank you for this consult.    Loye Vento, MD 11/09/2023    I spent a total of 71   minutes on total care today- >50% coordination of care-  due to  Review of chart, interview and exam of patient and documentation as well as review of imaging, labs, vitals as well as med orders and meds taken lately.

## 2023-11-09 NOTE — Progress Notes (Signed)
 Physical Therapy Treatment Patient Details Name: Katelyn Cline MRN: [id number] DOB: [birth year] Today's Date: 11/09/2023   History of Present Illness 63 y.o. female presented 10/25/23 for the evaluation of unstable angina. 5/7 CABG x3, AVR; extubated 5/11; BiPAP 5/11  PMH- 3v CAD, atrial fibrillation, COPD oxygen  dependent 4L, 100 pack year smoking history, diabetes, HTN, HLD, PAD, moderate aortic stenosis, agoraphobia, fibromyalgia, narcolepsy, peripheral neuropathy    PT Comments  Pt resting in bed on arrival and agreeable to session. Pt with steady progress towards acute goals this session, progressing transfers and short in room gait with less assist, min-mod A with RW for support. Pt continues to have most difficulty with transfers sit<>stand with up to mod A needed to boost from low surfaces and max cues for hand placement. Pt continues to be limited in safe mobility by decreased activity toelrance, impaired balance/postural reactions and weakness. Patient will benefit from intensive inpatient follow-up therapy, >3 hours/day. Pt continues to benefit from skilled PT services to progress toward functional mobility goals.     If plan is discharge home, recommend the following: Assistance with cooking/housework;Assist for transportation;Help with stairs or ramp for entrance   Can travel by private vehicle        Equipment Recommendations  Rolling walker (2 wheels)    Recommendations for Other Services       Precautions / Restrictions Precautions Precautions: Sternal;Fall Precaution Booklet Issued: No Recall of Precautions/Restrictions: Impaired Precaution/Restrictions Comments: cues for adherence during mobility Restrictions Weight Bearing Restrictions Per Provider Order: No     Mobility  Bed Mobility Overal bed mobility: Needs Assistance Bed Mobility: Supine to Sit, Sit to Supine     Supine to sit: Supervision, HOB elevated Sit to supine: Min assist   General bed mobility  comments: rolling to R, able to elevate trunk to sitting and scoot out to EOB for feet on floot, min A to manage LEs back to bed    Transfers Overall transfer level: Needs assistance Equipment used: Rolling walker (2 wheels) Transfers: Sit to/from Stand Sit to Stand: Min assist, From elevated surface, Mod assist           General transfer comment: min A from elevated EOB, mod A from low commode    Ambulation/Gait Ambulation/Gait assistance: Min assist Gait Distance (Feet): 15 Feet (x2) Assistive device: Rolling walker (2 wheels) Gait Pattern/deviations: Step-through pattern, Decreased stride length Gait velocity: decr     General Gait Details: light min A to steady and manage RW, pt declining further distance   Stairs             Wheelchair Mobility     Tilt Bed    Modified Rankin (Stroke Patients Only)       Balance Overall balance assessment: Needs assistance Sitting-balance support: No upper extremity supported, Feet unsupported Sitting balance-Leahy Scale: Good     Standing balance support: Bilateral upper extremity supported Standing balance-Leahy Scale: Poor Standing balance comment: reliant on UE support                            Communication Communication Communication: No apparent difficulties  Cognition Arousal: Alert Behavior During Therapy: WFL for tasks assessed/performed                             Following commands: Intact      Cueing Cueing Techniques: Verbal cues  Exercises  General Comments General comments (skin integrity, edema, etc.): on 3L O2 (as at home) with poor pleth while ambulating. >90% when good waveform      Pertinent Vitals/Pain Pain Assessment Pain Assessment: Faces Faces Pain Scale: Hurts little more Pain Location: B shoulders Pain Descriptors / Indicators: Discomfort Pain Intervention(s): Monitored during session, Limited activity within patient's tolerance    Home  Living                          Prior Function            PT Goals (current goals can now be found in the care plan section) Acute Rehab PT Goals Patient Stated Goal: return home PT Goal Formulation: With patient Time For Goal Achievement: 11/22/23 Progress towards PT goals: Progressing toward goals    Frequency    Min 2X/week      PT Plan      Co-evaluation              AM-PAC PT "6 Clicks" Mobility   Outcome Measure  Help needed turning from your back to your side while in a flat bed without using bedrails?: A Little Help needed moving from lying on your back to sitting on the side of a flat bed without using bedrails?: A Little Help needed moving to and from a bed to a chair (including a wheelchair)?: A Lot Help needed standing up from a chair using your arms (e.g., wheelchair or bedside chair)?: A Lot Help needed to walk in hospital room?: Total Help needed climbing 3-5 steps with a railing? : Total 6 Click Score: 12    End of Session Equipment Utilized During Treatment: Gait belt;Oxygen  Activity Tolerance: Patient tolerated treatment well Patient left: with call bell/phone within reach;in bed Nurse Communication: Mobility status PT Visit Diagnosis: Difficulty in walking, not elsewhere classified (R26.2)     Time: 4098-1191 PT Time Calculation (min) (ACUTE ONLY): 24 min  Charges:    $Gait Training: 8-22 mins $Therapeutic Activity: 8-22 mins PT General Charges $$ ACUTE PT VISIT: 1 Visit                     Antony Baumgartner R. PTA Acute Rehabilitation Services Office: [phone]   Agapito Horseman 11/09/2023, 5:38 PM

## 2023-11-09 NOTE — Progress Notes (Addendum)
 Pt did not sleep well last night, she asked for more rest before walking in the hallway, Will return later to ambulate.  Pt received OHS book and education on restrictions, heart healthy diet, ex guidelines, Move in the Tube sheet, incentive spirometer use when d/c and CRPII. Pt denies questions and was encouraged to look in the book for additional information. Referral placed to [facility].   Pt is interested in CRP2  Returned at 1320 Pt found resting in bed on RA, SpO2 82%, SpO2 increased to 92-100% on 3L. Pt instructed not to remove [place]. Pt helped to chair in room. Pt is max assist to stand. She was SOB after taking a couple steps and SpO2 dropped to 86% so O2 was turned up to 4L.Pt left in chair with all needs met.  Barkley Li MS, ACSM-CEP 11/09/2023 9:01 AM  [phone]

## 2023-11-09 NOTE — Progress Notes (Signed)
 Pt's CBG was only 65 at 1112, encouraged for oral intake, simple carbs given and rechecked at 1158, CBG was only 47, patient denied to drink orange juice, 25% dextrose  50ml given as per the protocol, PA informed.  CBG checked at 1240, 104 mg/dl.

## 2023-11-09 NOTE — Progress Notes (Addendum)
 Patient Name: Katelyn Cline Date of Encounter: 11/09/2023 [facility] Cardiologist: Lauro Portal, MD   Interval Summary  .    63 yr old female with PMH of CAD, paroxysmal A fib on Eliquis , aortic stenosis, oxygen  dependent COPD with 4-5LNC, PAD with severe SFA disease, HTN, type 2 DM, metastatic melanoma, wedge resection RUL metastatic melanoma, fibromyalgia, and tobacco abuse, who is admitted for chest pain. Cath 10/01/23 showed multivessel CAD, mPAP 18 and PCWP 13, CO and Index 6.37-3.08. CT surgery consulted for CABG with AVR. She underwent CABG x3 with LIMA to LAD, RSVG PDA, OM1 along with aortic valve replacement with a 23mm Inspiris valve and placement of 45mm Atriclip on 11/03/23 by Dr Deloise Ferries. Post op course complicated by bleeding requiring return to OR, difficulty weaning off ventilator,  hypotension requiring vasopressor support,  decompensated HFpEF with drop in co-ox, and anemia requiring PRBC transfusion. AHF had been consulted 11/04/23, started on IV diuresis with lasix  with good response. She was extubated on 11/07/23. Post op Echo 11/08/23 showed LVEF 60-65% , no RWMA, mild LVH, grade II DD, RV pressure/volume overload, mildly reduced RV, mod LAE, mild RAE, trivial MR, mod MAC, bioprosthetic aortic valve in place with mean gradient mildly elevated at 17 mmHg. No perivalvular regurgitation noted. She was transitioned to PO lasix  20mg  daily yesterday. She is not transferred out of ICU, general cardiology will continue to follow.   Patient states she did a lot moving around yesterday, did not sleep well, feels very tired today, has some incision pain of chest as well as leg swelling. She felt her breathing is overall stable.   Vital Signs .    Vitals:   11/09/23 0500 11/09/23 0734 11/09/23 0755 11/09/23 0916  BP:   (!) 116/44 (!) 114/44  Pulse:   76 72  Resp:  20 18   Temp:   97.8 F (36.6 C)   TempSrc:   Oral   SpO2:   98% 95%  Weight: 98.5 kg     Height:         Intake/Output Summary (Last 24 hours) at 11/09/2023 1006 Last data filed at 11/09/2023 0333 Gross per 24 hour  Intake 515.9 ml  Output 1450 ml  Net -934.1 ml      11/09/2023    5:00 AM 11/08/2023    4:52 AM 11/07/2023    5:00 AM  Last 3 Weights  Weight (lbs) 217 lb 2.5 oz 221 lb 9 oz 219 lb 2.2 oz  Weight (kg) 98.5 kg 100.5 kg 99.4 kg      Telemetry/ECG    Sinus rhythm 60s, PACs and PVCs  - Personally Reviewed  Physical Exam .   GEN: No acute distress.   Neck: Unable assess JVD due to dressing in place  Cardiac: RRR, no murmurs, rubs, or gallops.  Respiratory: Clear to auscultation bilaterally. Decreased at base. On 3LNC, Pox 96%. Speaks full sentence GI: Soft, nontender, non-distended  MS: RLE 1+ edema, LLE trace edema   Assessment & Plan .     Acute on chronic HFpEF - post op day 6, weaned off ventilator 5/11 and all vasopressor support, transferred out of ICU - Post op Echo 11/08/23 showed LVEF 60-65% , no RWMA, mild LVH, grade II DD, RV pressure/volume overload, mildly reduced RV, mod LAE, mild RAE, trivial MR, mod MAC, bioprosthetic aortic valve in place with mean gradient mildly elevated at 17 mmHg. No perivalvular regurgitation noted.  - transitioned from IV Lasix  to Lasix  40mg   PO daily yesterday, Net not recorded, weight is 221 on 11/02/23 and down to 217.15 today, continue current dose of lasix , continue track weight  - GDMT: possible add SGLT2I before discharge   CAD and aortic stenosis s/p CABG x3 with bioprosthetic AVR and LAA clip 11/03/23 - Post op Echo as above  - continue ASA 81mg  and lipitor 80mg   - further post-op care per CT surgery    Paroxsymal A fib - in sinus rhythm  - PTA Eliquis  has been resumed today, on metoprolol  25mg  BID, PTA cardizem  held, ok continue this regimen   HTN - BP low normal, PTA cardizem , hydrochlorothiazide , imdur , lisinopril  held, resume if BP requires   Acute on chronic hypoxic respiratory failure Leukocytosis    COPD Possible OSA and OHS  Anemia  PAD  Chronic pain Type 2 DM Debility  - per ICU  For questions or updates, please contact [facility] Please consult [email or web address] for contact info under    Signed, Xika Zhao, NP   Patient seen and examined, note reviewed with the signed Advanced Practice Provider. I personally reviewed laboratory data, imaging studies and relevant notes. I independently examined the patient and formulated the important aspects of the plan. I have personally discussed the plan with the patient and/or family. Comments or changes to the note/plan are indicated below.  Acute on Chronic HFpEF CAD status post CABG x3  Aortic Stenosis s/p bioprosthetic AVR  S/p LAA clip  PAF Hypertension   Stable off  ventilator support, out of th ICU yesterday.  Now on PO Lasix  will continue this  for now. Continue her current metoprolol  dose, hope to add the SGLT-2 inhibitor.  No angina cont Aspirin  and statin  In sinus rhythm continue metoprolol  and Eliquis  ( s/p LAA clip) Will continue to follow   Jerryl Morin DO, MS [facility] Attending Cardiologist [facility]  [address] [place], [place] [address] [phone] Website: [email or web address]

## 2023-11-09 NOTE — PMR Pre-admission (Shared)
 PMR Admission Coordinator Pre-Admission Assessment  Patient: Katelyn Cline is an 63 y.o., female MRN: [id number] DOB: [birth year] Height: 5' 8.5" (174 cm) Weight: 98.5 kg              Insurance Information HMO: yes    PPO:      PCP:      IPA:      80/20:      OTHER:  PRIMARY: [employer]      Policy#: [id number]       Subscriber: pt CM Name: ***      Phone#: [phone] ***     Fax#: [phone] Pre-Cert#: ***      Employer:  Benefits:  Phone #: [phone]     Name:  Venson Ginger. Date: ***     Deduct: ***      Out of Pocket Max: ***      Life Max:   CIR: ***      SNF: *** Outpatient: ***     Co-Pay: *** Home Health: ***      Co-Pay: *** DME: ***     Co-Pay: *** Providers:  SECONDARY:       Policy#:       Phone#:   Financial Counselor:       Phone#:   The "Data Collection Information Summary" for patients in Inpatient Rehabilitation Facilities with attached "Privacy Act Statement-Health Care Records" was provided and verbally reviewed with: Patient and Family  Emergency Contact Information Contact Information     Name Relation Home Work Mobile   [place] Son [phone]  [phone]      Other Contacts   None on File    Current Medical History  Patient Admitting Diagnosis: cardiopulmonary debility   History of Present Illness: Pt is a 63 y/o female with PMH of CAD, COPD (home O2 dependent for 12 years), afib on eliquis , PAD with severe bilateral SFA disease, DM controlled, HTN, history of metastatic melanoma, RUL wedge resection, fibromyalgia, heavy tobacco use, and obesity who was admitted on 4/28 to [facility] with chest pain.  Workup revealed diffuse ST depressions, slight leukocytosis, slight anemia, elevated troponin.  Prior to this admission she had a cardiac cath which showed three vessel coronary disease and had planned for outpatient cardiothoracic workup for CABG/SAVR.  She was admitted to CTS service for expedited surgical evaluation and underwent CABG x3  with AVR on 5/7 per Dr. Deloise Ferries.  Post op course complicated by ongoing bleeding requiring multiple units of blood and return to OR for control, CHF decompensation requiring IV lasix  and vasopressors.  She was able to be weaned from the ventilator on 5/11.  Hospital course further complicated by increasing leukocytosis and started on cefepime .  Therapy ongoing and recommendations are for CIR.    Glasgow Coma Scale Score: 15  Patient's medical record from [facility] has been reviewed by the rehabilitation admission coordinator and physician.  Past Medical History  Past Medical History:  Diagnosis Date   Agoraphobia    Anxiety    Arthritis    "all joints"   Chronic low back pain    Chronic respiratory failure with hypoxia ([name])    4L  via [place],  followed by pcp,   (09-16-2018  per pt only uses while at home and at night ,  portable oxygen  is not working)   Clotting disorder ([name]) 2013   Polycythemia vera   Colon polyps    COPD (chronic obstructive pulmonary disease) ([name])    DDD (degenerative disc disease),  lumbosacral    Dental caries    DM type 2 (diabetes mellitus, type 2) ([name])    followed by pcp   Fibromyalgia    GERD (gastroesophageal reflux disease)    occasional , no meds   Heart murmur 2020   History of basal cell carcinoma (BCC) excision    s/p  Moh's of face/ nose 12/ 2013   History of palpitations    per pt found to have occaional PVCs   History of UTI    HTN (hypertension)    Hyperlipidemia    Loose, teeth    Major depression    Metastatic melanoma ([name]) 10/18/2012   12 mm posterior right upper lobe pulmonary nodule, max SUV 3.0  s/p  right wedge resection 10-21-2012,  followed by oncologist-- dr Salomon Cree,  no recurrence   Mixed stress and urge urinary incontinence    Myeloproliferative neoplasm ([name])    Narcolepsy    Obesity    OSA (obstructive sleep apnea)    Oxygen  deficiency    Oxygen  dependent    4 L via [place],  per pt portable oxygen  not working but does use  while at home and at night   Panic attacks    Periodontitis    Peripheral neuropathy    feet   Polycythemia vera(238.4) hematology/ oncology-- dr Salomon Cree ([facility])   first dx 02014 due to smoker/copd--  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 Yes  Family History  family history includes ADD / ADHD in her son; Alcohol  abuse in her maternal grandfather; Anxiety disorder in her mother; Arthritis in her mother; Dementia in her father and mother; Depression in her mother; Heart disease in her brother and father; Hyperlipidemia in her father and mother; Hypertension in her father; Stroke in her father.   Current Medications   Current Facility-Administered Medications:    acetaminophen  (TYLENOL ) 160 MG/5ML solution 650 mg, 650 mg, Per Tube, Once, Roddenberry, Myron G, PA-C   acetaminophen  (TYLENOL ) tablet 650 mg, 650 mg, Oral, Q6H PRN, Zimmerman, Donielle M, PA-C   arformoterol  (BROVANA ) nebulizer solution 15 mcg, 15 mcg, Nebulization, BID, Lightfoot, Marinell Siad, MD, 15 mcg at 11/09/23 0731   [START ON 11/10/2023] aspirin  EC tablet 81 mg, 81 mg, Oral, Daily **OR** [START ON 11/10/2023] aspirin  chewable tablet 81 mg, 81 mg, Per Tube, Daily, Zimmerman, Donielle M, PA-C   atorvastatin  (LIPITOR) tablet 80 mg, 80 mg, Oral, QHS, Roddenberry, Myron G, PA-C, 80 mg at 11/08/23 2123   bisacodyl  (DULCOLAX) EC tablet 10 mg, 10 mg, Oral, Daily, 10 mg at 11/09/23 0916 **OR** bisacodyl  (DULCOLAX) suppository 10 mg, 10 mg, Rectal, Daily, Roddenberry, Myron G, PA-C   buPROPion  ER (WELLBUTRIN  SR) 12 hr tablet 100 mg, 100 mg, Oral,  BID, Lightfoot, Harrell O, MD, 100 mg at 11/09/23 1119   ceFEPIme  (MAXIPIME ) 2 g in sodium chloride  0.9 % 100 mL IVPB, 2 g, Intravenous, Q8H, Sheron Dixons, RPH, Last Rate: 200 mL/hr at 11/09/23 1526, 2 g at 11/09/23 1526   Chlorhexidine  Gluconate Cloth 2 % PADS 6 each, 6  each, Topical, Daily, Lightfoot, Marinell Siad, MD, 6 each at 11/08/23 1000   dextrose  50 % solution, , , ,    docusate (COLACE) 50 MG/5ML liquid 200 mg, 200 mg, Per Tube, Daily, Lightfoot, Harrell O, MD, 200 mg at 11/09/23 1610   DULoxetine  (CYMBALTA ) DR capsule 60 mg, 60 mg, Oral, BID, Lightfoot, Marinell Siad, MD, 60 mg at 11/09/23 9604   furosemide  (LASIX ) tablet 40 mg, 40 mg, Oral, Daily, Lightfoot, Marinell Siad, MD, 40 mg at 11/09/23 5409   hydrALAZINE  (APRESOLINE ) injection 10 mg, 10 mg, Intravenous, Q6H PRN, Jaquita Merl P, DO, 10 mg at 11/07/23 1426   hydrALAZINE  (APRESOLINE ) tablet 10 mg, 10 mg, Oral, Q8H, Jaquita Merl P, DO, 10 mg at 11/09/23 8119   insulin  aspart (novoLOG ) injection 0-20 Units, 0-20 Units, Subcutaneous, TID WC, Lightfoot, Harrell O, MD   insulin  glargine-yfgn (SEMGLEE) injection 16 Units, 16 Units, Subcutaneous, BID, Zimmerman, Donielle M, PA-C   ipratropium-albuterol  (DUONEB) 0.5-2.5 (3) MG/3ML nebulizer solution 3 mL, 3 mL, Nebulization, Q4H PRN, Marlys Singh, John D, PA-C   metoprolol  tartrate (LOPRESSOR ) injection 2.5-5 mg, 2.5-5 mg, Intravenous, Q2H PRN, Roddenberry, Myron G, PA-C, 5 mg at 11/07/23 1515   metoprolol  tartrate (LOPRESSOR ) tablet 25 mg, 25 mg, Oral, BID, Raliegh Burgess, RPH, 25 mg at 11/07/23 1617   morphine  (PF) 2 MG/ML injection 1-4 mg, 1-4 mg, Intravenous, Q1H PRN, Roddenberry, Myron G, PA-C   ondansetron  (ZOFRAN ) injection 4 mg, 4 mg, Intravenous, Q6H PRN, Roddenberry, Myron G, PA-C   Oral care mouth rinse, 15 mL, Mouth Rinse, PRN, Lightfoot, Harrell O, MD   oxyCODONE  (Oxy IR/ROXICODONE ) immediate release tablet 5-10 mg, 5-10 mg, Oral, Q3H PRN, Roddenberry, Myron G, PA-C, 10 mg at 11/08/23  2158   pantoprazole  (PROTONIX ) injection 40 mg, 40 mg, Intravenous, Q12H, Payne, John D, PA-C, 40 mg at 11/09/23 1478   potassium chloride  SA (KLOR-CON  M) CR tablet 40 mEq, 40 mEq, Oral, BID, Zimmerman, Donielle M, PA-C, 40 mEq at 11/09/23 2956   revefenacin  (YUPELRI ) nebulizer solution 175 mcg, 175 mcg, Nebulization, Daily, Payne, John D, PA-C, 175 mcg at 11/09/23 0731   sodium chloride  flush (NS) 0.9 % injection 3 mL, 3 mL, Intravenous, Q12H, Lightfoot, Harrell O, MD, 3 mL at 11/09/23 0919   sodium chloride  flush (NS) 0.9 % injection 3 mL, 3 mL, Intravenous, PRN, Lightfoot, Harrell O, MD   traMADol  (ULTRAM ) tablet 50-100 mg, 50-100 mg, Oral, Q4H PRN, Roddenberry, Myron G, PA-C  Patients Current Diet:  Diet Order             Diet heart healthy/carb modified Room service appropriate? Yes; Fluid consistency: Thin  Diet effective now                   Precautions / Restrictions Precautions Precautions: Sternal, Fall Precaution Booklet Issued: No Restrictions Weight Bearing Restrictions Per Provider Order: No   Has the patient had 2 or more falls or a fall with injury in the past year?Yes  Prior Activity Level Household: using a cane, driving, son assists with IADLs (cleaning, laundry, cooking, etc)  Prior Functional Level Prior Function Prior Level of Function : Needs assist, Driving Mobility Comments: using a cane ADLs Comments: son assists with housework Occupational psychologist, groceries, garbage...any heavy work)  Self Care: Did the patient need help bathing, dressing, using the toilet or eating?  Independent  Indoor Mobility: Did the patient need assistance with walking from room to room (with or without device)? Independent  Stairs: Did the patient need  assistance with internal or external stairs (with or without device)? Needed some help  Functional Cognition: Did the patient need help planning regular tasks such as shopping or remembering to take medications? Needed some  help  Patient Information Are you of Hispanic, Latino/a,or Spanish origin?: A. No, not of Hispanic, Latino/a, or Spanish origin What is your race?: A. White Do you need or want an interpreter to communicate with a doctor or health care staff?: 0. No  Patient's Response To:  Health Literacy and Transportation Is the patient able to respond to health literacy and transportation needs?: Yes Health Literacy - How often do you need to have someone help you when you read instructions, pamphlets, or other written material from your doctor or pharmacy?: Never In the past 12 months, has lack of transportation kept you from medical appointments or from getting medications?: No In the past 12 months, has lack of transportation kept you from meetings, work, or from getting things needed for daily living?: No  Journalist, newspaper / Equipment Home Equipment: Cane - single point, Shower seat, Hand held shower head, Other (comment) (O2)  Prior Device Use: Indicate devices/aids used by the patient prior to current illness, exacerbation or injury? cane  Current Functional Level Cognition  Orientation Level: Oriented X4    Extremity Assessment (includes Sensation/Coordination)  Upper Extremity Assessment: Generalized weakness, Right hand dominant  Lower Extremity Assessment: Defer to PT evaluation    ADLs  Overall ADL's : Needs assistance/impaired Eating/Feeding: Sitting, Set up Grooming: Wash/dry hands, Sitting, Set up (total assist to brush hair) Upper Body Bathing: Sitting, Moderate assistance Lower Body Bathing: Sit to/from stand, Moderate assistance Upper Body Dressing : Moderate assistance, Sitting Lower Body Dressing: Set up, Sit to/from stand Lower Body Dressing Details (indicate cue type and reason): can perform figure 4, prefers slippers to socks Toilet Transfer: Minimal assistance, Ambulation Toileting- Clothing Manipulation and Hygiene: Total assistance, Sit to/from  stand Functional mobility during ADLs: Minimal assistance, +2 for safety/equipment, Rolling walker (2 wheels)    Mobility  Overal bed mobility: Needs Assistance Bed Mobility: Supine to Sit Supine to sit: Supervision, HOB elevated General bed mobility comments: from [facility] elevated pt turned to sit EOB and gradually scooted to reach feet to floor ; holding pillow to maintain precautions    Transfers  Overall transfer level: Needs assistance Equipment used: Rolling walker (2 wheels) Transfers: Sit to/from Stand Sit to Stand: Min assist, +2 physical assistance, +2 safety/equipment, From elevated surface General transfer comment: bed elevated; initial attempt unsuccessful; used momentum and hands on knees with success    Ambulation / Gait / Stairs / Wheelchair Mobility  Ambulation/Gait Ambulation/Gait assistance: Min assist, +2 safety/equipment Gait Distance (Feet): 40 Feet Assistive device: Rolling walker (2 wheels) Gait Pattern/deviations: Step-through pattern, Decreased stride length General Gait Details: pt required assist to steer/maneuver RW; became quiet with flat demeanor after 20 ft and returned to room; ?oxygen  dropping with poor pleth Gait velocity interpretation: <1.8 ft/sec, indicate of risk for recurrent falls    Posture / Balance Balance Overall balance assessment: Needs assistance Sitting-balance support: No upper extremity supported, Feet unsupported Sitting balance-Leahy Scale: Good Standing balance support: Bilateral upper extremity supported Standing balance-Leahy Scale: Poor    Special needs/care consideration Continuous Drip IV  cefepime , Oxygen  3-4L baseline, Skin sternotomy incision, and Diabetic management yes     Previous Home Environment (from acute therapy documentation) Living Arrangements: Children (son) Available Help at Discharge: Family, Available PRN/intermittently Type of Home: House Home Layout: One level  Home Access: Stairs to enter Entrance  Stairs-Rails: Right, Left Entrance Stairs-Number of Steps: 4 Bathroom Shower/Tub: Engineer, manufacturing systems: Standard Home Care Services: No  Discharge Living Setting Plans for Discharge Living Setting: Patient's home, Lives with (comment) (son Myrtie Atkinson) Type of Home at Discharge: House Discharge Home Layout: One level Discharge Home Access: Stairs to enter Entrance Stairs-Rails: Right, Left Entrance Stairs-Number of Steps: 4 Discharge Bathroom Shower/Tub: Tub/shower unit Discharge Bathroom Toilet: Standard Discharge Bathroom Accessibility: Yes How Accessible: Accessible via walker Does the patient have any problems obtaining your medications?: No  Social/Family/Support Systems Anticipated Caregiver: son, Myrtie Atkinson Anticipated Industrial/product designer Information: [phone] Ability/Limitations of Caregiver: none stated, on FMLA Caregiver Availability: 24/7 Discharge Plan Discussed with Primary Caregiver: Yes Is Caregiver In Agreement with Plan?: Yes Does Caregiver/Family have Issues with Lodging/Transportation while Pt is in Rehab?: No   Goals Patient/Family Goal for Rehab: PT/OT/SLP supervision to mod I Expected length of stay: 12-14 days Additional Information: Discharge plan: home with son who can provide 24/7 supervision at discharge Pt/Family Agrees to Admission and willing to participate: Yes Program Orientation Provided & Reviewed with Pt/Caregiver Including Roles  & Responsibilities: Yes   Decrease burden of Care through IP rehab admission: n/a   Possible need for SNF placement upon discharge: Not anticipated.  Plan for discharge back to pt's home where son can provide 24/7 supervision.    Patient Condition: This patient's condition remains as documented in the consult dated 11/09/23, in which the Rehabilitation Physician determined and documented that the patient's condition is appropriate for intensive rehabilitative care in an inpatient rehabilitation facility. Will  admit to inpatient rehab pending insurance approval ***.  Preadmission Screen Completed By:  Mickey Alar, PT, DPT 11/09/2023 4:05 PM ______________________________________________________________________   Discussed status with Dr. Aaron Aason***at *** and received approval for admission today.  Admission Coordinator:  Yenesis Even E Kennadie Brenner, time***/Date***

## 2023-11-09 NOTE — Progress Notes (Addendum)
      [address]       [place] [address]             [phone]        6 Days Post-Op Procedure(s) (LRB): EXPLORATION POST OPERATIVE OPEN HEART; WASHOUT (N/A) ECHOCARDIOGRAM, TRANSESOPHAGEAL (N/A)  Subjective: Patient did not sleep well, but no specific complaint this am.  Objective: Vital signs in last 24 hours: Temp:  [96.6 F (35.9 C)-98.2 F (36.8 C)] 97.8 F (36.6 C) (05/13 0307) Pulse Rate:  [52-81] 65 (05/13 0307) Cardiac Rhythm: Normal sinus rhythm (05/12 2000) Resp:  [6-26] 20 (05/13 0307) BP: (112-135)/(47-110) 115/57 (05/13 0307) SpO2:  [87 %-100 %] 100 % (05/13 0307) FiO2 (%):  [0 %] 0 % (05/12 1154)  Pre op weight 98.5 kg Current Weight  11/08/23 100.5 kg    Hemodynamic parameters for last 24 hours: CVP:  [0 mmHg-25 mmHg] 25 mmHg  Intake/Output from previous day: 05/12 0701 - 05/13 0700 In: 1195.9 [P.O.:720; IV Piggyback:475.9] Out: 1650 [Urine:1650]   Physical Exam:  Cardiovascular: RRR Pulmonary: Slightly diminished bibasilar breath sounds L>R Abdomen: Soft, obese, non tender, bowel sounds present. Extremities: Mild bilateral lower extremity edema. Wounds: Clean and dry.  No erythema or signs of infection.  Lab Results: CBC: Recent Labs    11/08/23 0340 11/09/23 0425  WBC 28.2* 23.6*  HGB 9.7* 9.8*  HCT 32.2* 32.2*  PLT 587* 780*   BMET:  Recent Labs    11/08/23 1636 11/09/23 0425  NA 140 137  K 4.0 3.4*  CL 105 103  CO2 28 26  GLUCOSE 110* 73  BUN 20 19  CREATININE 0.57 0.59  CALCIUM  8.1* 8.3*    PT/INR:  Lab Results  Component Value Date   INR 2.1 (H) 11/03/2023   INR 1.5 (H) 11/03/2023   INR 1.1 10/28/2023   ABG:  INR: Will add last result for INR, ABG once components are confirmed Will add last 4 CBG results once components are confirmed  Assessment/Plan:  1. CV - History of a paroxysmal atrial fibrillation. Maintaining SR. On Hydralazine  10 mg tid, Lopressor  25 mg bid. Apixaban  on hold  secondary to GI bleed on admission;will discuss with Dr. Deloise Ferries when to resume Apixaban . Echocardiogram done yesterday showed LVEF 60-65%, trivial MR, no AI, AV mean gradient 17 mmHg, small pericardial effusion. 2.  Pulmonary - History of COPD. Continue Brovana , Duoneb, and Yupelri . On 3 liters of oxygen  via [place]. She was on oxygen  at home prior to admission. Check PA/LAT CXR. Encourage incentive spirometer. 3. Acute on chronic diastolic heart failure-on Lasix  40 mg daily. Await AHF evaluation and recommendations this am 4.  Expected post op acute blood loss anemia - H and H this am stable at 9.8 and 32.2 5. DM-CBGs 98/136/41. On scheduled Insulin  but will decrease to avoid further hypoglycemia. Pre op HGA1C 5.1. She was on Metformin  1000 mg bid prior to admission and will restart at discharge 6. Leukocytosis-WBC decreased to 23,600. Remains afebrile. On Cefepime  for possible HCAP. Respiratory cultures re incubated for better growth. 7. Supplement potassium 8. On Lovenox  for DVT prophylaxis 9. She will likely be a candidate for CIR, once ready for discharge.  Donielle M ZimmermanPA-C 6:55 AM   Agree will restart eliquis  Dispo planning  Branko Steeves O Annalyce Lanpher

## 2023-11-09 NOTE — TOC Progression Note (Addendum)
 Transition of Care [facility]) - Progression Note    Patient Details  Name: Katelyn Cline MRN: [id number] Date of Birth: 1961/03/06  [facility] CM/SW Contact  Ernst Heap Phone Number: [phone] 11/09/2023, 2:57 PM  Clinical Narrative:   [facility] CSW met with patient at bedside. CSW introduced self and explained role. CSW addressed the SNF consult with patient. Patient is agreeable to SNF. CSW will fax the patient and present bed offers at the appropriate time.   CSW completed passr, FL2, and faxed out. SNF bed offers pending.   TOC will continue following.     Expected Discharge Plan:  (TBD post CABG) Barriers to Discharge: Continued Medical Work up  Expected Discharge Plan and Services                                               Social Determinants of Health (SDOH) Interventions SDOH Screenings   Food Insecurity: No Food Insecurity (10/25/2023)  Housing: Low Risk  (10/25/2023)  Transportation Needs: No Transportation Needs (10/25/2023)  Utilities: Not At Risk (10/25/2023)  Alcohol  Screen: Low Risk  (06/18/2020)  Depression (PHQ2-9): High Risk (07/16/2023)  Financial Resource Strain: Low Risk  (12/24/2022)  Physical Activity: Inactive (12/24/2022)  Social Connections: Socially Isolated (10/25/2023)  Stress: Stress Concern Present (12/24/2022)  Tobacco Use: High Risk (11/03/2023)    Readmission Risk Interventions     No data to display

## 2023-11-09 NOTE — Progress Notes (Signed)
 I reviewed today's PA/LAT CXR, which shows a moderate left pleural effusion. I restarted Apixaban  earlier today and she has received one dose. I have stopped Apixaban . She is only on baby ec asa. IR consulted for left thoracentesis.

## 2023-11-09 NOTE — Progress Notes (Signed)
 Inpatient Rehab Coordinator Note:  I met with patient and her son at bedside to discuss CIR recommendations and goals/expectations of CIR stay.  We reviewed 3 hrs/day of therapy, physician follow up, and average length of stay 2 weeks (dependent upon progress) with goals of supervision to mod I.  Pt decreased STM within our visit.  Son on [facility] and can provide expected level of assist at discharge.  We reviewed need for insurance auth through [facility] and I will start this after her thoracentesis is completed.    Loye Rumble, PT, DPT Admissions Coordinator [phone] 11/09/23  4:02 PM

## 2023-11-10 ENCOUNTER — Inpatient Hospital Stay (HOSPITAL_COMMUNITY)

## 2023-11-10 DIAGNOSIS — I200 Unstable angina: Secondary | ICD-10-CM | POA: Diagnosis not present

## 2023-11-10 LAB — POCT I-STAT 7, (LYTES, BLD GAS, ICA,H+H)
Acid-base deficit: 1 mmol/L (ref 0.0–2.0)
Bicarbonate: 23.6 mmol/L (ref 20.0–28.0)
Calcium, Ion: 1.22 mmol/L (ref 1.15–1.40)
HCT: 32 % — ABNORMAL LOW (ref 36.0–46.0)
Hemoglobin: 10.9 g/dL — ABNORMAL LOW (ref 12.0–15.0)
O2 Saturation: 92 %
Patient temperature: 97.7
Potassium: 3.6 mmol/L (ref 3.5–5.1)
Sodium: 137 mmol/L (ref 135–145)
TCO2: 25 mmol/L (ref 22–32)
pCO2 arterial: 35.9 mmHg (ref 32–48)
pH, Arterial: 7.425 (ref 7.35–7.45)
pO2, Arterial: 62 mmHg — ABNORMAL LOW (ref 83–108)

## 2023-11-10 LAB — BASIC METABOLIC PANEL WITH GFR
Anion gap: 5 (ref 5–15)
BUN: 13 mg/dL (ref 8–23)
CO2: 27 mmol/L (ref 22–32)
Calcium: 8.6 mg/dL — ABNORMAL LOW (ref 8.9–10.3)
Chloride: 106 mmol/L (ref 98–111)
Creatinine, Ser: 0.63 mg/dL (ref 0.44–1.00)
GFR, Estimated: >60 mL/min (ref >60)
Glucose, Bld: 82 mg/dL (ref 70–99)
Potassium: 4.5 mmol/L (ref 3.5–5.1)
Sodium: 138 mmol/L (ref 135–145)

## 2023-11-10 LAB — GLUCOSE, CAPILLARY
Glucose-Capillary: 83 mg/dL (ref 70–99)
Glucose-Capillary: 42 mg/dL — CL (ref 70–99)
Glucose-Capillary: 69 mg/dL — ABNORMAL LOW (ref 70–99)
Glucose-Capillary: 134 mg/dL — ABNORMAL HIGH (ref 70–99)
Glucose-Capillary: 55 mg/dL — ABNORMAL LOW (ref 70–99)
Glucose-Capillary: 120 mg/dL — ABNORMAL HIGH (ref 70–99)
Glucose-Capillary: 48 mg/dL — ABNORMAL LOW (ref 70–99)
Glucose-Capillary: 80 mg/dL (ref 70–99)

## 2023-11-10 LAB — CBC
HCT: 33.8 % — ABNORMAL LOW (ref 36.0–46.0)
Hemoglobin: 10.2 g/dL — ABNORMAL LOW (ref 12.0–15.0)
MCH: 28.5 pg (ref 26.0–34.0)
MCHC: 30.2 g/dL (ref 30.0–36.0)
MCV: 94.4 fL (ref 80.0–100.0)
Platelets: 958 10*3/uL (ref 150–400)
RBC: 3.58 MIL/uL — ABNORMAL LOW (ref 3.87–5.11)
RDW: 19 % — ABNORMAL HIGH (ref 11.5–15.5)
WBC: 27.5 10*3/uL — ABNORMAL HIGH (ref 4.0–10.5)
nRBC: 0.7 % — ABNORMAL HIGH (ref 0.0–0.2)

## 2023-11-10 MED ORDER — DEXTROSE 50 % IV SOLN: 50.0000 mL | INTRAVENOUS | Status: AC

## 2023-11-10 MED ORDER — INSULIN GLARGINE-YFGN 100 UNIT/ML ~~LOC~~ SOLN
10.0000 [IU] | Freq: Two times a day (BID) | SUBCUTANEOUS | Status: DC
  Administered 2023-11-10: 10 [IU] via SUBCUTANEOUS
  Filled 2023-11-10 (×2): qty 0.1

## 2023-11-10 MED ORDER — INSULIN GLARGINE-YFGN 100 UNIT/ML ~~LOC~~ SOLN
8.0000 [IU] | Freq: Two times a day (BID) | SUBCUTANEOUS | Status: DC
  Filled 2023-11-10: qty 0.08

## 2023-11-10 MED ORDER — HYDROXYUREA 500 MG PO CAPS
1000.0000 mg | ORAL_CAPSULE | Freq: Every day | ORAL | Status: DC
Start: 2023-11-10 — End: 2023-11-25
  Administered 2023-11-10 – 2023-11-25 (×16): 1000 mg via ORAL
  Filled 2023-11-10 (×16): qty 2

## 2023-11-10 MED ORDER — GLUCOSE 40 % PO GEL: 2.0000 | ORAL | Status: AC

## 2023-11-10 MED ORDER — DEXTROSE 50 % IV SOLN
INTRAVENOUS | Status: AC
  Filled 2023-11-10: qty 50

## 2023-11-10 MED ORDER — LIDOCAINE HCL 1 % IJ SOLN
INTRAMUSCULAR | Status: AC
  Filled 2023-11-10: qty 20

## 2023-11-10 MED ORDER — APIXABAN 5 MG PO TABS
5.0000 mg | ORAL_TABLET | Freq: Two times a day (BID) | ORAL | Status: DC
Start: 2023-11-10 — End: 2023-11-25
  Administered 2023-11-10 – 2023-11-25 (×30): 5 mg via ORAL
  Filled 2023-11-10 (×30): qty 1

## 2023-11-10 MED ORDER — FUROSEMIDE 10 MG/ML IJ SOLN
40.0000 mg | Freq: Once | INTRAMUSCULAR | Status: AC
  Administered 2023-11-10: 40 mg via INTRAVENOUS
  Filled 2023-11-10: qty 4

## 2023-11-10 MED ORDER — LISINOPRIL 10 MG PO TABS
10.0000 mg | ORAL_TABLET | Freq: Every day | ORAL | Status: DC
  Administered 2023-11-10 – 2023-11-11 (×2): 10 mg via ORAL
  Filled 2023-11-10 (×2): qty 1

## 2023-11-10 MED ORDER — LIDOCAINE HCL 1 % IJ SOLN
20.0000 mL | Freq: Once | INTRAMUSCULAR | Status: AC
  Administered 2023-11-10: 10 mL via INTRADERMAL
  Filled 2023-11-10: qty 20

## 2023-11-10 NOTE — Progress Notes (Addendum)
 CBG was 42mg /dl at 1308, 65% dextrose  50ml given as per the protocol.. CBG rechecked at 122. 69mg /dl, encouraged for oral intake, simple carbs provided, patient denied any symptoms, will continue to monitor.  CBG rechecked at 1346 was only 55mg /dl,  Rechecked 7846, blood sample from earlobe 134mg /dl.

## 2023-11-10 NOTE — Plan of Care (Signed)
  Problem: Fluid Volume: Goal: Ability to maintain a balanced intake and output will improve Outcome: Progressing   Problem: Coping: Goal: Ability to adjust to condition or change in health will improve Outcome: Progressing   Problem: Metabolic: Goal: Ability to maintain appropriate glucose levels will improve Outcome: Progressing   Problem: Nutritional: Goal: Maintenance of adequate nutrition will improve Outcome: Progressing Goal: Progress toward achieving an optimal weight will improve Outcome: Progressing   Problem: Skin Integrity: Goal: Risk for impaired skin integrity will decrease Outcome: Progressing   Problem: Tissue Perfusion: Goal: Adequacy of tissue perfusion will improve Outcome: Progressing   Problem: Cardiac: Goal: Ability to achieve and maintain adequate cardiovascular perfusion will improve Outcome: Progressing   Problem: Clinical Measurements: Goal: Ability to maintain clinical measurements within normal limits will improve Outcome: Progressing Goal: Will remain free from infection Outcome: Progressing Goal: Diagnostic test results will improve Outcome: Progressing Goal: Respiratory complications will improve Outcome: Progressing Goal: Cardiovascular complication will be avoided Outcome: Progressing   Problem: Clinical Measurements: Goal: Will remain free from infection Outcome: Progressing   Problem: Clinical Measurements: Goal: Diagnostic test results will improve Outcome: Progressing   Problem: Clinical Measurements: Goal: Respiratory complications will improve Outcome: Progressing

## 2023-11-10 NOTE — Progress Notes (Signed)
 OT Cancellation Note  Patient Details Name: Katelyn Cline MRN: [id number] DOB: [birth year]   Cancelled Treatment:    Reason Eval/Treat Not Completed: Medical issues which prohibited therapy (pt with hypoglycemia). Will continue to follow.  Jonette Nestle 11/10/2023, 1:49 PM Avanell Leigh, OTR/L Acute Rehabilitation Services Office: [phone]

## 2023-11-10 NOTE — Progress Notes (Addendum)
 I spoke with Dr. Randye Buttner regarding increased WBC and platelet counts. She has a history of polycythemia vera. The etiology is likely a stress response to surgery. Will obtain CBC, iron  panel and ferritin in am If aforementioned are low, may supplement with IV iron  (Venofer ). Patient already has lab appointment to recheck as outpatient. She was on Hydroxyurea  1000-1500 mg prior to surgery. As discussed with Dr. Salomon Cree via secure chat, will restart 1000 mg daily for now. Apixaban  will also be restarted tonight.

## 2023-11-10 NOTE — Progress Notes (Signed)
 Inpatient Rehab Admissions Coordinator:   Insurance auth request begun today.  Will follow for determination.   Loye Rumble, PT, DPT Admissions Coordinator [phone] 11/10/23  12:22 PM

## 2023-11-10 NOTE — Progress Notes (Signed)
 Mobility Specialist Progress Note:    11/10/23 1002  Mobility  Activity Transferred from bed to chair  Level of Assistance Moderate assist, patient does 50-74% (+2)  Assistive Device Front wheel walker  Distance Ambulated (ft) 10 ft  Activity Response Tolerated well  Mobility Referral Yes  Mobility visit 1 Mobility  Mobility Specialist Start Time (ACUTE ONLY) 1002  Mobility Specialist Stop Time (ACUTE ONLY) 1010  Mobility Specialist Time Calculation (min) (ACUTE ONLY) 8 min   Pt received in bed, assisted nursing to transfer pt to chair by sink for bath. ModA+2 to stand after 2 attempts, CGA during ambulation with RW. Tolerated well, VSS throughout. Left pt in chair with nursing in room.    Raydin Bielinski Mobility Specialist Please contact via Special educational needs teacher or  Rehab office at [phone]

## 2023-11-10 NOTE — Progress Notes (Signed)
 Patient was found confused and she mentioned that seeing things in the room, she walked to the bathroom couple times with assistance. Patient's son mentioned that she has h/o UTI but she denied any symptoms of UTI. On call PA notified. She was incontinent with bowel once. Complaint of mid-sternal incision site pain, PRN pain medication given.  He has chronic right shoulder pain, K pad used, skin is intact.

## 2023-11-10 NOTE — Inpatient Diabetes Management (Signed)
 Inpatient Diabetes Program Recommendations  AACE/ADA: New Consensus Statement on Inpatient Glycemic Control (2015)  Target Ranges:  Prepandial:   less than 140 mg/dL      Peak postprandial:   less than 180 mg/dL (1-2 hours)      Critically ill patients:  140 - 180 mg/dL   Lab Results  Component Value Date   GLUCAP 83 11/10/2023   HGBA1C 5.1 10/28/2023    Latest Reference Range & Units 11/08/23 16:29 11/08/23 19:43 11/09/23 06:20 11/09/23 11:12 11/09/23 11:58 11/09/23 12:40 11/09/23 16:07 11/09/23 21:08 11/10/23 06:14  Glucose-Capillary 70 - 99 mg/dL 98 130 (H) 41 (LL) 65 (L) 47 (L) 104 (H) 119 (H) 106 (H) 83  (LL): Data is critically low (H): Data is abnormally high (L): Data is abnormally low  Inpatient Diabetes Program Recommendations:   Noted Semglee decreased to 10 units daily. Also consider: -Decrease Novolog  correction to 0-6 units tid, 0-5 units hs  Thank you, Lion Fernandez E. Yusuke Beza, RN, MSN, CDCES  Diabetes Coordinator Inpatient Glycemic Control Team Team Pager [phone] (8am-5pm) 11/10/2023 11:45 AM

## 2023-11-10 NOTE — Progress Notes (Addendum)
      [address]       [place] [address]             [phone]        7 Days Post-Op Procedure(s) (LRB): EXPLORATION POST OPERATIVE OPEN HEART; WASHOUT (N/A) ECHOCARDIOGRAM, TRANSESOPHAGEAL (N/A)  Subjective: Patient just waking up this am. She is passing a lot of flatus;no bowel movement in a few days but does not feel bad.   Objective: Vital signs in last 24 hours: Temp:  [97.8 F (36.6 C)-99 F (37.2 C)] 99 F (37.2 C) (05/14 0341) Pulse Rate:  [58-76] 67 (05/14 0341) Cardiac Rhythm: Normal sinus rhythm (05/13 1900) Resp:  [18-21] 20 (05/14 0341) BP: (112-154)/(41-78) 154/51 (05/14 0341) SpO2:  [95 %-100 %] 100 % (05/14 0341) FiO2 (%):  [32 %] 32 % (05/13 0734) Weight:  [103 kg] 103 kg (05/14 0226)  Pre op weight 98.5 kg Current Weight  11/10/23 103 kg      Intake/Output from previous day: 05/13 0701 - 05/14 0700 In: 1020 [P.O.:720; IV Piggyback:300] Out: 1200 [Urine:1200]   Physical Exam:  Cardiovascular: RRR Pulmonary: Diminished bibasilar breath sounds L>R Abdomen: Soft, obese, non tender, bowel sounds present. Extremities: Mild bilateral lower extremity edema. Wounds: Clean and dry.  No erythema or signs of infection.  Lab Results: CBC: Recent Labs    11/08/23 0340 11/09/23 0425  WBC 28.2* 23.6*  HGB 9.7* 9.8*  HCT 32.2* 32.2*  PLT 587* 780*   BMET:  Recent Labs    11/08/23 1636 11/09/23 0425  NA 140 137  K 4.0 3.4*  CL 105 103  CO2 28 26  GLUCOSE 110* 73  BUN 20 19  CREATININE 0.57 0.59  CALCIUM  8.1* 8.3*    PT/INR:  Lab Results  Component Value Date   INR 2.1 (H) 11/03/2023   INR 1.5 (H) 11/03/2023   INR 1.1 10/28/2023   ABG:  INR: Will add last result for INR, ABG once components are confirmed Will add last 4 CBG results once components are confirmed  Assessment/Plan:  1. CV - History of a paroxysmal atrial fibrillation. Maintaining SR. On Hydralazine  10 mg tid, Lopressor  25 mg bid. Apixaban  on  hold until after left thoracentesis. Will stop Hydralazine  and restart low dose Lisinopril . 2.  Pulmonary - History of COPD. Continue Brovana , Duoneb, and Yupelri . On 3 liters of oxygen  via [place]. She was on oxygen  at home prior to admission.PA/LAT CXR done yesterday showed moderate left pleural effusion. IR consulted for left thoracentesis. Encourage incentive spirometer. 3. Acute on chronic diastolic heart failure-on Lasix  40 mg daily.  4.  Expected post op acute blood loss anemia - H and H yesterday stable at 9.8 and 32.2 5. DM-CBGs 119/106/83. On scheduled Insulin  but will decrease to avoid further hypoglycemia. I have made her NPO this am, awaiting IR to do a left thoracentesis. Pre op HGA1C 5.1. She was on Metformin  1000 mg bid prior to admission and will restart at discharge 6. Leukocytosis-WBC decreased yesterday to 23,600. Remains afebrile. On Cefepime  for possible HCAP. Respiratory cultures showed normal respiratory flora;no Staph aureus or Pseudomonas. Of note, she has a history of polycythemia vera (increased WBC and platelets). She has lab checks and follow up with heme/oncology post op 7. Hopefully, insurance will approve CIR, once ready for discharge.  Ko Bardon M ZimmermanPA-C 7:00 AM

## 2023-11-10 NOTE — Progress Notes (Signed)
 Informed to the on call PA for critical platelet result [phone].

## 2023-11-10 NOTE — Progress Notes (Signed)
 Mobility Specialist Progress Note:    11/10/23 1043  Mobility  Activity Transferred from chair to bed;Ambulated with assistance in room;Ambulated with assistance in hallway  Level of Assistance Minimal assist, patient does 75% or more  Assistive Device Front wheel walker  Distance Ambulated (ft) 40 ft  Activity Response Tolerated well  Mobility Referral Yes  Mobility visit 1 Mobility  Mobility Specialist Start Time (ACUTE ONLY) 1030  Mobility Specialist Stop Time (ACUTE ONLY) 1043  Mobility Specialist Time Calculation (min) (ACUTE ONLY) 13 min   Pt received in chair, agreeable to mobility session. MinA required to stand from chair, CGA to ambulate in hallway. Tolerated fairly. Pt c/o weakness, denied dizziness. Looked pale near EOS, requiring CGA to keep from LOB. Ambulated back to room, sitting up in chair. Imaging entered requesting to transfer pt C>B. Required MinA to stand with RW again to transfer. Lying comfortably with all needs met, left in care of imaging.   Shion Bluestein Mobility Specialist Please contact via Special educational needs teacher or  Rehab office at [phone]

## 2023-11-10 NOTE — Procedures (Signed)
 PROCEDURE SUMMARY:  Successful US  guided left thoracentesis. Yielded 1.0 liters of bloody fluid. Pt tolerated procedure well. No immediate complications.  Specimen was not sent for labs. CXR ordered.  EBL < 5 mL  Lillian Rein PA-C 11/10/2023 11:23 AM

## 2023-11-10 NOTE — Progress Notes (Addendum)
 Patient Name: Katelyn Cline Date of Encounter: 11/10/2023 [facility] Cardiologist: Lauro Portal, MD   Interval Summary  .    Patient reports feeling well this morning.  Her breathing is improving.  Denies chest pain.  Has very mild lower extremity edema.  Is pending left thoracentesis later today  Vital Signs .    Vitals:   11/10/23 0226 11/10/23 0341 11/10/23 0718 11/10/23 0733  BP:  (!) 154/51  (!) 125/46  Pulse:  67 67 64  Resp: (!) 21 20 18  (!) 24  Temp:  99 F (37.2 C)  98.2 F (36.8 C)  TempSrc:  Oral  Oral  SpO2:  100%  90%  Weight: 103 kg     Height:        Intake/Output Summary (Last 24 hours) at 11/10/2023 0839 Last data filed at 11/10/2023 0415 Gross per 24 hour  Intake 780 ml  Output 1200 ml  Net -420 ml      11/10/2023    2:26 AM 11/09/2023    5:00 AM 11/08/2023    4:52 AM  Last 3 Weights  Weight (lbs) 227 lb 1.2 oz 217 lb 2.5 oz 221 lb 9 oz  Weight (kg) 103 kg 98.5 kg 100.5 kg      Telemetry/ECG    NSR - Personally Reviewed  Physical Exam .   GEN: No acute distress. Laying flat in the bed. Asleep, arouses easily to voice  Neck: No JVD Cardiac:  RRR, no murmurs, rubs, or gallops.  Respiratory: Crackles and diminished breath sounds in left lower lung, otherwise clear to auscultation. Normal work of breathing on 3 L via [place]  GI: Soft, nontender, non-distended  MS: Trace edema in BLE   Assessment & Plan .     Acute on chronic HFpEF Moderate left pleural effusion  - Echocardiogram 11/08/2023 showed EF 60-65%, no regional wall motion abnormalities, mild LVH, grade 2 DD.  There was DD shaped septum suggestive of RV pressure/volume overload, mildly reduced RV systolic function - Treated with IV lasix , transitioned to PO lasix  40 mg daily on 5/12. Output 1.2 L urine yesterday on PO lasix . Remains net + 1 L since admission  - She has mild lower extremity swelling today and remains on 3 L via [place]. Ordered an additional 40 mg IV lasix  today  -  Strict I/Os, daily weights  - BMP pending this AM - Consider SGL2i once recovered from surgery  - Plan is for L thoracentesis today   CAD  - Underwent CABG x3 with bioprosthetic AVR and LAA clip 11/03/23  - Post-op care per CT surgery  - Continue ASA 81 mg daily, lipitor 80 mg daily, metoprolol  tartrate 25 mg BID  PAF - Maintaining NSR  - eliquis  on hold pending L thoracentesis - Continue metoprolol  tartrate 12.5 mg BID   HTN  - Continue current regiment   Otherwise per primary  - Acute on chronic hypoxic respiratory failure  - Leukocytosis  - COPD  - Possible OSA and OHS  - Anemia  - PAD  - Chronic Pain  - Type 2 DM  - Debility   For questions or updates, please contact [facility] Please consult [email or web address] for contact info under    Signed, Debria Fang, PA-C  Patient seen and examined, note reviewed with the signed Advanced Practice Provider. I personally reviewed laboratory data, imaging studies and relevant notes. I independently examined the patient and formulated the important aspects of the plan. I  have personally discussed the plan with the patient and/or family. Comments or changes to the note/plan are indicated below.  Patient seen examined her bedside.  She was sleeping when I arrived but open her eyes to voice. She is aware of her planned left thoracocentesis today. No questions for me at this time. In terms of her medication continue her metoprolol  tartrate 12.5 mg twice daily, continue aspirin  81 mg daily, continue Lipitor 80 mg daily. Agree with the dose of Lasix  we will continue to monitor her output.  Keeley Sussman DO, MS [facility] Attending Cardiologist [facility]  [address] [place], [place] [address] [phone] Website: [email or web address]

## 2023-11-10 NOTE — Plan of Care (Signed)
  Problem: Education: Goal: Ability to describe self-care measures that may prevent or decrease complications (Diabetes Survival Skills Education) will improve Outcome: Progressing Goal: Individualized Educational Video(s) Outcome: Progressing   Problem: Coping: Goal: Ability to adjust to condition or change in health will improve Outcome: Progressing   Problem: Fluid Volume: Goal: Ability to maintain a balanced intake and output will improve Outcome: Progressing   Problem: Health Behavior/Discharge Planning: Goal: Ability to identify and utilize available resources and services will improve Outcome: Progressing Goal: Ability to manage health-related needs will improve Outcome: Progressing   Problem: Metabolic: Goal: Ability to maintain appropriate glucose levels will improve Outcome: Progressing   Problem: Nutritional: Goal: Maintenance of adequate nutrition will improve Outcome: Progressing Goal: Progress toward achieving an optimal weight will improve Outcome: Progressing   Problem: Skin Integrity: Goal: Risk for impaired skin integrity will decrease Outcome: Progressing   Problem: Tissue Perfusion: Goal: Adequacy of tissue perfusion will improve Outcome: Progressing   Problem: Education: Goal: Understanding of cardiac disease, CV risk reduction, and recovery process will improve Outcome: Progressing Goal: Individualized Educational Video(s) Outcome: Progressing   Problem: Activity: Goal: Ability to tolerate increased activity will improve Outcome: Progressing   Problem: Cardiac: Goal: Ability to achieve and maintain adequate cardiovascular perfusion will improve Outcome: Progressing   Problem: Health Behavior/Discharge Planning: Goal: Ability to safely manage health-related needs after discharge will improve Outcome: Progressing   Problem: Education: Goal: Knowledge of General Education information will improve Description: Including pain rating scale,  medication(s)/side effects and non-pharmacologic comfort measures Outcome: Progressing   Problem: Health Behavior/Discharge Planning: Goal: Ability to manage health-related needs will improve Outcome: Progressing   Problem: Clinical Measurements: Goal: Ability to maintain clinical measurements within normal limits will improve Outcome: Progressing Goal: Will remain free from infection Outcome: Progressing Goal: Diagnostic test results will improve Outcome: Progressing Goal: Respiratory complications will improve Outcome: Progressing Goal: Cardiovascular complication will be avoided Outcome: Progressing   Problem: Activity: Goal: Risk for activity intolerance will decrease Outcome: Progressing   Problem: Nutrition: Goal: Adequate nutrition will be maintained Outcome: Progressing   Problem: Coping: Goal: Level of anxiety will decrease Outcome: Progressing   Problem: Elimination: Goal: Will not experience complications related to bowel motility Outcome: Progressing Goal: Will not experience complications related to urinary retention Outcome: Progressing   Problem: Pain Managment: Goal: General experience of comfort will improve and/or be controlled Outcome: Progressing   Problem: Safety: Goal: Ability to remain free from injury will improve Outcome: Progressing   Problem: Skin Integrity: Goal: Risk for impaired skin integrity will decrease Outcome: Progressing   Problem: Education: Goal: Will demonstrate proper wound care and an understanding of methods to prevent future damage Outcome: Progressing Goal: Knowledge of disease or condition will improve Outcome: Progressing Goal: Knowledge of the prescribed therapeutic regimen will improve Outcome: Progressing Goal: Individualized Educational Video(s) Outcome: Progressing   Problem: Activity: Goal: Risk for activity intolerance will decrease Outcome: Progressing   Problem: Cardiac: Goal: Will achieve and/or  maintain hemodynamic stability Outcome: Progressing   Problem: Clinical Measurements: Goal: Postoperative complications will be avoided or minimized Outcome: Progressing   Problem: Respiratory: Goal: Respiratory status will improve Outcome: Progressing   Problem: Skin Integrity: Goal: Wound healing without signs and symptoms of infection Outcome: Progressing Goal: Risk for impaired skin integrity will decrease Outcome: Progressing   Problem: Urinary Elimination: Goal: Ability to achieve and maintain adequate renal perfusion and functioning will improve Outcome: Progressing   Problem: Safety: Goal: Non-violent Restraint(s) Outcome: Progressing

## 2023-11-10 NOTE — Progress Notes (Signed)
 Occupational Therapy Treatment Patient Details Name: Katelyn Cline MRN: [id number] DOB: 1961/05/23 Today's Date: 11/10/2023   History of present illness 63 y.o. female presented 10/25/23 for the evaluation of unstable angina. 5/7 CABG x3, AVR; extubated 5/11; BiPAP 5/11  PMH- 3v CAD, atrial fibrillation, COPD oxygen  dependent 4L, 100 pack year smoking history, diabetes, HTN, HLD, PAD, moderate aortic stenosis, agoraphobia, fibromyalgia, narcolepsy, peripheral neuropathy   OT comments  Pt up in chair. Noted cognitive changes since evaluation on 5/12, RN aware. Pt needing assist to use phone, disoriented to time. No awareness of sternal precautions, reviewed and provided written handout. Pt completed seated eating with set up and grooming with set up to total assist. Practiced sit<>stand x 5 with heavy mod to light mod assist and verbal cues. Patient will benefit from intensive inpatient follow-up therapy, >3 hours/day.      If plan is discharge home, recommend the following:  A lot of help with bathing/dressing/bathroom;Assistance with cooking/housework;Assist for transportation;Help with stairs or ramp for entrance;A little help with walking and/or transfers;Direct supervision/assist for medications management;Direct supervision/assist for financial management   Equipment Recommendations  BSC/3in1    Recommendations for Other Services      Precautions / Restrictions Precautions Precautions: Sternal;Fall Precaution Booklet Issued: Yes (comment) Recall of Precautions/Restrictions: Impaired Precaution/Restrictions Comments: reviewed sternal handout and "move in the tube" Restrictions Weight Bearing Restrictions Per Provider Order: No       Mobility Bed Mobility               General bed mobility comments: in chair    Transfers Overall transfer level: Needs assistance Equipment used: Rolling walker (2 wheels) Transfers: Sit to/from Stand Sit to Stand: Mod assist            General transfer comment: cues for hands on knees and to use momentum, assist to rise and steady, performed x 5     Balance Overall balance assessment: Needs assistance   Sitting balance-Leahy Scale: Good       Standing balance-Leahy Scale: Poor Standing balance comment: reliant on UE support                           ADL either performed or assessed with clinical judgement   ADL Overall ADL's : Needs assistance/impaired Eating/Feeding: Set up;Sitting   Grooming: Wash/dry hands;Sitting;Set up;Brushing hair;Total assistance               Lower Body Dressing: Minimal assistance;Sitting/lateral leans Lower Body Dressing Details (indicate cue type and reason): slip on shoes                    Extremity/Trunk Assessment              Vision       Perception     Praxis     Communication Communication Communication: No apparent difficulties   Cognition Arousal: Lethargic Behavior During Therapy: WFL for tasks assessed/performed Cognition: Cognition impaired   Orientation impairments: Time Awareness: Intellectual awareness impaired, Online awareness impaired Memory impairment (select all impairments): Short-term memory, Non-declarative long-term memory, Working memory Attention impairment (select first level of impairment): Sustained attention Executive functioning impairment (select all impairments): Sequencing, Reasoning, Problem solving OT - Cognition Comments: pt confusing call button with phone, assist to hang up phone, pt with nonsensical conversation with son on phone                 Following commands: Intact  Cueing   Cueing Techniques: Verbal cues  Exercises      Shoulder Instructions       General Comments      Pertinent Vitals/ Pain       Pain Assessment Pain Assessment: Faces Faces Pain Scale: Hurts a little bit Pain Location: chest incision Pain Descriptors / Indicators: Sore Pain Intervention(s):  Monitored during session, Repositioned  Home Living                                          Prior Functioning/Environment              Frequency  Min 2X/week        Progress Toward Goals  OT Goals(current goals can now be found in the care plan section)  Progress towards OT goals: Progressing toward goals  Acute Rehab OT Goals OT Goal Formulation: With patient Time For Goal Achievement: 11/22/23 Potential to Achieve Goals: Good  Plan      Co-evaluation                 AM-PAC OT "6 Clicks" Daily Activity     Outcome Measure   Help from another person eating meals?: A Little Help from another person taking care of personal grooming?: A Lot Help from another person toileting, which includes using toliet, bedpan, or urinal?: A Lot Help from another person bathing (including washing, rinsing, drying)?: A Lot Help from another person to put on and taking off regular upper body clothing?: A Little Help from another person to put on and taking off regular lower body clothing?: A Lot 6 Click Score: 14    End of Session Equipment Utilized During Treatment: Rolling walker (2 wheels);Oxygen ;Gait belt  OT Visit Diagnosis: Unsteadiness on feet (R26.81);Other abnormalities of gait and mobility (R26.89);Muscle weakness (generalized) (M62.81);Other symptoms and signs involving cognitive function   Activity Tolerance Patient limited by lethargy   Patient Left in chair;with call bell/phone within reach   Nurse Communication Other (comment) (cognitive changes)        Time: 9604-5409 OT Time Calculation (min): 18 min  Charges: OT General Charges $OT Visit: 1 Visit OT Treatments $Therapeutic Activity: 8-22 mins  Avanell Leigh, OTR/L Acute Rehabilitation Services Office: [phone]   Jonette Nestle 11/10/2023, 3:10 PM

## 2023-11-11 DIAGNOSIS — I200 Unstable angina: Secondary | ICD-10-CM | POA: Diagnosis not present

## 2023-11-11 LAB — IRON AND TIBC
Iron: 25 ug/dL — ABNORMAL LOW (ref 28–170)
Saturation Ratios: 9 % — ABNORMAL LOW (ref 10.4–31.8)
TIBC: 291 ug/dL (ref 250–450)
UIBC: 266 ug/dL

## 2023-11-11 LAB — BASIC METABOLIC PANEL WITH GFR
Anion gap: 10 (ref 5–15)
BUN: 13 mg/dL (ref 8–23)
CO2: 23 mmol/L (ref 22–32)
Calcium: 8.3 mg/dL — ABNORMAL LOW (ref 8.9–10.3)
Chloride: 104 mmol/L (ref 98–111)
Creatinine, Ser: 0.55 mg/dL (ref 0.44–1.00)
GFR, Estimated: >60 mL/min (ref >60)
Glucose, Bld: 110 mg/dL — ABNORMAL HIGH (ref 70–99)
Potassium: 3.7 mmol/L (ref 3.5–5.1)
Sodium: 137 mmol/L (ref 135–145)

## 2023-11-11 LAB — GLUCOSE, CAPILLARY
Glucose-Capillary: 86 mg/dL (ref 70–99)
Glucose-Capillary: 91 mg/dL (ref 70–99)
Glucose-Capillary: 149 mg/dL — ABNORMAL HIGH (ref 70–99)
Glucose-Capillary: 153 mg/dL — ABNORMAL HIGH (ref 70–99)

## 2023-11-11 LAB — URINALYSIS, ROUTINE W REFLEX MICROSCOPIC
Bilirubin Urine: NEGATIVE
Glucose, UA: NEGATIVE mg/dL
Hgb urine dipstick: NEGATIVE
Ketones, ur: NEGATIVE mg/dL
Leukocytes,Ua: NEGATIVE
Nitrite: NEGATIVE
Protein, ur: NEGATIVE mg/dL
Specific Gravity, Urine: 1.004 — ABNORMAL LOW (ref 1.005–1.030)
pH: 6 (ref 5.0–8.0)

## 2023-11-11 LAB — CBC
HCT: 31.7 % — ABNORMAL LOW (ref 36.0–46.0)
Hemoglobin: 9.7 g/dL — ABNORMAL LOW (ref 12.0–15.0)
MCH: 29 pg (ref 26.0–34.0)
MCHC: 30.6 g/dL (ref 30.0–36.0)
MCV: 94.9 fL (ref 80.0–100.0)
Platelets: 936 10*3/uL (ref 150–400)
RBC: 3.34 MIL/uL — ABNORMAL LOW (ref 3.87–5.11)
RDW: 19.1 % — ABNORMAL HIGH (ref 11.5–15.5)
WBC: 28.7 10*3/uL — ABNORMAL HIGH (ref 4.0–10.5)
nRBC: 0.6 % — ABNORMAL HIGH (ref 0.0–0.2)

## 2023-11-11 LAB — FERRITIN: Ferritin: 154 ng/mL (ref 11–307)

## 2023-11-11 MED ORDER — DOCUSATE SODIUM 50 MG/5ML PO LIQD
200.0000 mg | Freq: Every day | ORAL | Status: DC
  Filled 2023-11-11 (×3): qty 20

## 2023-11-11 MED ORDER — SPIRONOLACTONE 12.5 MG HALF TABLET
12.5000 mg | ORAL_TABLET | Freq: Every day | ORAL | Status: DC
  Administered 2023-11-11 – 2023-11-25 (×15): 12.5 mg via ORAL
  Filled 2023-11-11 (×14): qty 1

## 2023-11-11 MED ORDER — FUROSEMIDE 10 MG/ML IJ SOLN
80.0000 mg | Freq: Once | INTRAMUSCULAR | Status: AC
  Administered 2023-11-11: 80 mg via INTRAVENOUS
  Filled 2023-11-11: qty 8

## 2023-11-11 MED ORDER — METOPROLOL TARTRATE 12.5 MG HALF TABLET
12.5000 mg | ORAL_TABLET | Freq: Two times a day (BID) | ORAL | Status: DC
  Administered 2023-11-11 – 2023-11-25 (×28): 12.5 mg via ORAL
  Filled 2023-11-11 (×29): qty 1

## 2023-11-11 NOTE — Inpatient Diabetes Management (Signed)
 Inpatient Diabetes Program Recommendations  AACE/ADA: New Consensus Statement on Inpatient Glycemic Control (2015)  Target Ranges:  Prepandial:   less than 140 mg/dL      Peak postprandial:   less than 180 mg/dL (1-2 hours)      Critically ill patients:  140 - 180 mg/dL   Lab Results  Component Value Date   GLUCAP 86 11/11/2023   HGBA1C 5.1 10/28/2023    Latest Reference Range & Units 11/10/23 13:46 11/10/23 13:52 11/10/23 16:43 11/10/23 16:44 11/10/23 21:26 11/11/23 06:11  Glucose-Capillary 70 - 99 mg/dL 55 (L) 161 (H) 48 (L) 096 (H) 80 86  (L): Data is abnormally low (H): Data is abnormally high Review of Glycemic Control  Inpatient Diabetes Program Recommendations:   Noted that blood sugars have been less than 70 mg/dl.  Recommend changing Novolog  correction scale to 0-9 units TID. Noted that Semglee has been discontinued.   Nick Barman RN BSN CDE Diabetes Coordinator Pager: [phone]  8am-5pm

## 2023-11-11 NOTE — Progress Notes (Signed)
   11/11/23 1950  BiPAP/CPAP/SIPAP  BiPAP/CPAP/SIPAP Pt Type Adult  Reason BIPAP/CPAP not in use Non-compliant (Pt refused and stated she will let RT know if she feels like she needs it for the night.)  BiPAP/CPAP /SiPAP Vitals  Pulse Rate 66  Resp (!) 21  SpO2 96 %  Bilateral Breath Sounds Diminished  MEWS Score/Color  MEWS Score 1  MEWS Score Color Green

## 2023-11-11 NOTE — Progress Notes (Signed)
 Inpatient Rehab Admissions Coordinator:   Received standard, pre-decision request for peer to peer from [facility].  Dr. Raynaldo Call reviewed and felt with leukocytosis would not be able to argue medical readiness until workup complete (note UA and CXR ordered).  I left a message for [employer] CM to see if we could hold review until tomorrow.   Loye Rumble, PT, DPT Admissions Coordinator [phone] 11/11/23  10:24 AM

## 2023-11-11 NOTE — Evaluation (Signed)
 Speech Language Pathology Evaluation Patient Details Name: DACHE MALMGREN MRN: [id number] DOB: Jul 14, 1960 Today's Date: 11/11/2023 Time: 6962-9528 SLP Time Calculation (min) (ACUTE ONLY): 17 min  Problem List:  Patient Active Problem List   Diagnosis Date Noted   S/P aortic valve replacement with bioprosthetic valve 11/03/2023   S/P CABG (coronary artery bypass graft) 11/03/2023   Unstable angina ([name]) 10/25/2023   Coronary artery disease involving native coronary artery of native heart with unstable angina pectoris ([name]) 10/25/2023   Symptomatic anemia 10/25/2023   Chronic bronchitis ([name]) 10/25/2023   PAD (peripheral artery disease) ([name]) 10/25/2023   Elevated coronary artery calcium  score 09/08/2023   Moderate aortic stenosis 09/08/2023   Claudication in peripheral vascular disease ([name]) 09/08/2023   Hypercoagulable state due to persistent atrial fibrillation ([name]) 06/10/2023   Atrial fibrillation ([name]) 06/08/2023   Vitamin D  deficiency 05/18/2022   MPN (myeloproliferative neoplasm) ([name])    Leukocytosis 03/12/2018   Dental caries 03/12/2018   Aortic atherosclerosis ([name]) 02/21/2018   Constipation 05/28/2015   OE (otitis externa) 01/04/2015   Seborrheic keratoses 08/24/2014   PVC (premature ventricular contraction) 05/18/2014   Type 2 diabetes mellitus with diabetic neuropathy ([name]) 02/06/2013   Chronic hypoxic respiratory failure, on home oxygen  therapy ([name]) 02/06/2013   History of basal cell carcinoma 12/01/2012   Posttraumatic stress disorder 12/01/2012   History of malignant melanoma 10/18/2012   Polycythemia vera ([name]) 08/05/2012   Hyperlipidemia with target low density lipoprotein (LDL) cholesterol less than 55 mg/dL 41/32/4401   Narcolepsy 04/09/2012   Peripheral neuropathy 04/09/2012   Tobacco use disorder 04/09/2012   Obesity 04/09/2012   Essential hypertension, benign 04/09/2012   DDD (degenerative disc disease), lumbar 02/16/2012   DDD (degenerative disc  disease), cervical 02/16/2012   Chronic pain disorder 02/16/2012   Depression 02/16/2012   Fibromyalgia 02/16/2012   Past Medical History:  Past Medical History:  Diagnosis Date   Agoraphobia    Anxiety    Arthritis    "all joints"   Chronic low back pain    Chronic respiratory failure with hypoxia ([name])    4L  via [place],  followed by pcp,   (09-16-2018  per pt only uses while at home and at night ,  portable oxygen  is not working)   Clotting disorder ([name]) 2013   Polycythemia vera   Colon polyps    COPD (chronic obstructive pulmonary disease) ([name])    DDD (degenerative disc disease), lumbosacral    Dental caries    DM type 2 (diabetes mellitus, type 2) ([name])    followed by pcp   Fibromyalgia    GERD (gastroesophageal reflux disease)    occasional , no meds   Heart murmur 2020   History of basal cell carcinoma (BCC) excision    s/p  Moh's of face/ nose 12/ 2013   History of palpitations    per pt found to have occaional PVCs   History of UTI    HTN (hypertension)    Hyperlipidemia    Loose, teeth    Major depression    Metastatic melanoma ([name]) 10/18/2012   12 mm posterior right upper lobe pulmonary nodule, max SUV 3.0  s/p  right wedge resection 10-21-2012,  followed by oncologist-- dr Salomon Cree,  no recurrence   Mixed stress and urge urinary incontinence    Myeloproliferative neoplasm ([name])    Narcolepsy    Obesity    OSA (obstructive sleep apnea)    Oxygen  deficiency    Oxygen  dependent  4 L via [place],  per pt portable oxygen  not working but does use while at home and at night   Panic attacks    Periodontitis    Peripheral neuropathy    feet   Polycythemia vera(238.4) hematology/ oncology-- dr Salomon Cree ([facility])   first dx 02014 due to smoker/copd--  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)  hx phlebotomies   Pulmonary nodules    bilateral small stable per CT 03-11-2018   Scoliosis    Sleep apnea    Smokers' cough ([name])    per pt productive  a little in the morning's   Symptoms of upper respiratory infection (URI) 03/12/2018   Urinary (tract) obstruction    Wears glasses    Past Surgical History:  Past Surgical History:  Procedure Laterality Date   ABDOMINAL HYSTERECTOMY  1998   partial   AORTIC VALVE REPLACEMENT N/A 11/03/2023   Procedure: REPLACEMENT, AORTIC VALVE, OPEN USING INSPIRIS RESILIA AORTIC VALVE;  Surgeon: Hilarie Lovely, MD;  Location: [facility] OR;  Service: Open Heart Surgery;  Laterality: N/A;   BREAST LUMPECTOMY Bilateral 1995   benign per pt   CARDIOVERSION N/A 09/06/2023   Procedure: CARDIOVERSION;  Surgeon: Luana Rumple, MD;  Location: [facility] INVASIVE CV LAB;  Service: Cardiovascular;  Laterality: N/A;   CLIPPING OF ATRIAL APPENDAGE N/A 11/03/2023   Procedure: CLIPPING, LEFT ATRIAL APPENDAGE USING ATRICURE ATRICLIP;  Surgeon: Hilarie Lovely, MD;  Location: [facility] OR;  Service: Open Heart Surgery;  Laterality: N/A;   COLONOSCOPY W/ BIOPSIES AND POLYPECTOMY     Hx: of   CORONARY ARTERY BYPASS GRAFT N/A 11/03/2023   Procedure: CORONARY ARTERY BYPASS GRAFTING X 3, USING LEFT INTERNAL MAMMARY ARTERY AND RIGHT ENDOSCOPIC HARVESTED GREATER SAPHENOUS VEIN;  Surgeon: Hilarie Lovely, MD;  Location: [facility] OR;  Service: Open Heart Surgery;  Laterality: N/A;   CYSTECTOMY  2000   abdominal wall    DILATION AND CURETTAGE OF UTERUS  yrs ago   EXPLORATION POST OPERATIVE OPEN HEART N/A 11/03/2023   Procedure: EXPLORATION POST OPERATIVE OPEN HEART; WASHOUT;  Surgeon: Hilarie Lovely, MD;  Location: [facility] OR;  Service: Open Heart Surgery;  Laterality: N/A;   IR THORACENTESIS ASP PLEURAL SPACE W/IMG GUIDE  11/10/2023   MOHS SURGERY  2013   nose/face   MULTIPLE EXTRACTIONS WITH ALVEOLOPLASTY N/A 09/19/2018   Procedure: Extraction of tooth #'s 2, 3, 6-14, 18, and 20-30 with alveoloplasty;  Surgeon: Carrell Chroman, DDS;  Location: [facility] ORS;  Service: Oral Surgery;  Laterality: N/A;  GENERAL WITH NASAL TUBE   RIGHT/LEFT  HEART CATH AND CORONARY ANGIOGRAPHY N/A 10/01/2023   Procedure: RIGHT/LEFT HEART CATH AND CORONARY ANGIOGRAPHY;  Surgeon: Arleen Lacer, MD;  Location: [facility] INVASIVE CV LAB;  Service: Cardiovascular;  Laterality: N/A;   TEE WITHOUT CARDIOVERSION N/A 11/03/2023   Procedure: ECHOCARDIOGRAM, TRANSESOPHAGEAL;  Surgeon: Hilarie Lovely, MD;  Location: [facility];  Service: Open Heart Surgery;  Laterality: N/A;   TEE WITHOUT CARDIOVERSION N/A 11/03/2023   Procedure: ECHOCARDIOGRAM, TRANSESOPHAGEAL;  Surgeon: Hilarie Lovely, MD;  Location: [facility];  Service: Open Heart Surgery;  Laterality: N/A;   VIDEO ASSISTED THORACOSCOPY (VATS)/WEDGE RESECTION Right 10/21/2012   Procedure: VIDEO ASSISTED THORACOSCOPY (VATS)/WEDGE RESECTION;  Surgeon: Norita Beauvais, MD;  Location: [facility] OR;  Service: Thoracic;  Laterality: Right;   VIDEO BRONCHOSCOPY N/A 10/21/2012   Procedure: VIDEO BRONCHOSCOPY;  Surgeon: Norita Beauvais, MD;  Location: [facility] OR;  Service: Thoracic;  Laterality: N/A;  HPI:  63 y.o. female presented 10/25/23 for the evaluation of unstable angina. 5/7 CABG x3, AVR; extubated 5/11; BiPAP 5/11  PMH- 3v CAD, atrial fibrillation, COPD oxygen  dependent 4L, 100 pack year smoking history, diabetes, HTN, HLD, PAD, moderate aortic stenosis, agoraphobia, fibromyalgia, narcolepsy, peripheral neuropathy   Assessment / Plan / Recommendation Clinical Impression  Patient presents with cognitive-linguistic deficits across various domains including attention, memory, orientation, awareness, and problem solving that impact her ability to navigate daily situations safely. Patient disoriented to situation and time, though did utilize wall calendar to state date with minimal prompting. SLP conducted patient/family interview and facilitated completion of SLUMS examination. Patient originally states that we are in [place] , however quickly self corrects and states that keeping track of the state is "confusing". Patient mildly  tangential throughout, often confused with utterances unrelated to current subject matter. Patient recalled 2/5 words provided after 3 minute distracted delay, and did exhibited mildly improved awareness of cognitive deficits after analyzing performance on cognitive testing. Motor speech and receptive/expressive language appear to be [facility]. Patient would benefit from continued SLP services to target aforementioned deficits.    SLP Assessment  SLP Recommendation/Assessment: Patient needs continued Speech Lanaguage Pathology Services SLP Visit Diagnosis: Cognitive communication deficit (R41.841)    Recommendations for follow up therapy are one component of a multi-disciplinary discharge planning process, led by the attending physician.  Recommendations may be updated based on patient status, additional functional criteria and insurance authorization.    Follow Up Recommendations  Follow physician's recommendations for discharge plan and follow up therapies    Assistance Recommended at Discharge  Frequent or constant Supervision/Assistance  Functional Status Assessment Patient has had a recent decline in their functional status and demonstrates the ability to make significant improvements in function in a reasonable and predictable amount of time.  Frequency and Duration min 2x/week  2 weeks      SLP Evaluation Cognition  Overall Cognitive Status: Impaired/Different from baseline Arousal/Alertness: Awake/alert Orientation Level: Disoriented to time;Disoriented to situation;Oriented to person;Oriented to place Year: 2025 (correct with use of wall calendar) Month: May (correct with use of wall calendar) Day of Week: Other (Comment) (correct with use of wall calendar) Attention: Sustained Sustained Attention: Impaired Sustained Attention Impairment: Functional basic;Functional complex Memory: Impaired Memory Impairment: Retrieval deficit;Storage deficit;Decreased recall of new  information Awareness: Impaired Awareness Impairment: Intellectual impairment;Emergent impairment;Anticipatory impairment Problem Solving: Impaired Problem Solving Impairment: Functional basic;Verbal basic Executive Function: Organizing;Sequencing;Self Monitoring;Self Correcting Sequencing: Impaired Sequencing Impairment: Functional basic;Verbal basic Organizing: Impaired Organizing Impairment: Functional basic;Verbal basic Self Monitoring: Impaired Self Monitoring Impairment: Functional basic;Verbal basic Self Correcting: Impaired Self Correcting Impairment: Functional basic;Verbal basic Safety/Judgment: Impaired       Comprehension  Auditory Comprehension Overall Auditory Comprehension: Appears within functional limits for tasks assessed    Expression Expression Primary Mode of Expression: Verbal Verbal Expression Overall Verbal Expression: Appears within functional limits for tasks assessed Written Expression Dominant Hand: Right Written Expression: Not tested   Oral / Motor  Oral Motor/Sensory Function Overall Oral Motor/Sensory Function: Within functional limits Motor Speech Overall Motor Speech: Appears within functional limits for tasks assessed Intelligibility: Intelligible           Dorla Gartner, M.A., CCC-SLP  Rabon Scholle A Zephaniah Lubrano 11/11/2023, 10:54 AM

## 2023-11-11 NOTE — Progress Notes (Addendum)
 [address]       [place]n Cline [address]             [phone]        8 Days Post-Op Procedure(s) (LRB): EXPLORATION POST OPERATIVE OPEN HEART; WASHOUT (N/A) ECHOCARDIOGRAM, TRANSESOPHAGEAL (N/A)  Subjective: Per nurse documentation, she had confusion late yesterday. Patient is AAO x 3 this am  Objective: Vital signs in last 24 hours: Temp:  [97.7 F (36.5 C)-98.6 F (37 C)] 98.2 F (36.8 C) (05/15 0401) Pulse Rate:  [56-81] 59 (05/15 0401) Cardiac Rhythm: Normal sinus rhythm (05/14 1900) Resp:  [16-21] 20 (05/15 0401) BP: (99-136)/(50-86) 130/66 (05/15 0401) SpO2:  [92 %-100 %] 96 % (05/15 0401) FiO2 (%):  [32 %] 32 % (05/14 2202) Weight:  [100 kg] 100 kg (05/15 0300)  Pre op weight 98.5 kg Current Weight  11/11/23 100 kg      Intake/Output from previous day: 05/14 0701 - 05/15 0700 In: 480 [P.O.:480] Out: 700 [Urine:700]   Physical Exam: Cardiovascular: RRR Pulmonary: Slightly diminished left base and right lung is clear Abdomen: Soft, obese, non tender, bowel sounds present. Extremities: Mild bilateral lower extremity edema. Wounds: Clean and dry.  No erythema or signs of infection.  Lab Results: CBC: Recent Labs    11/10/23 0916 11/10/23 1750 11/11/23 0352  WBC 27.5*  --  28.7*  HGB 10.2* 10.9* 9.7*  HCT 33.8* 32.0* 31.7*  PLT 958*  --  936*   BMET:  Recent Labs    11/09/23 0425 11/10/23 0916 11/10/23 1750  NA 137 138 137  K 3.4* 4.5 3.6  CL 103 106  --   CO2 26 27  --   GLUCOSE 73 82  --   BUN 19 13  --   CREATININE 0.59 0.63  --   CALCIUM  8.3* 8.6*  --     PT/INR:  Lab Results  Component Value Date   INR 2.1 (H) 11/03/2023   INR 1.5 (H) 11/03/2023   INR 1.1 10/28/2023   ABG:  INR: Will add last result for INR, ABG once components are confirmed Will add last 4 CBG results once components are confirmed  Assessment/Plan:  1. CV - History of a paroxysmal atrial fibrillation. Maintaining SR, SB at times.  On Lisinopril  10 mg daily, Lopressor  25 mg bid, and Apixaban  5 mg bid. She has not consistently been getting Lopressor  (bradycardia) so will reduce dose 2.  Pulmonary - History of COPD, OSA. Continue Brovana , Duoneb, and Yupelri . On 3 liters of oxygen  via [place]. She was on oxygen  at home prior to admission. S/p left thoracentesis with 1L of bloody fluid removed. Check PA/LAT CXR in am. Encourage incentive spirometer.  3. Acute on chronic diastolic heart failure-on Lasix  40 mg daily.  4.  Expected post op acute blood loss anemia - H and H yesterday stable at 9.7 and 31.7 5. DM-CBGs 120/80/86. Scheduled Insulin  stopped yesterday to avoid further hypoglycemia. Pre op HGA1C 5.1. She was on Metformin  1000 mg bid prior to admission and will restart at discharge 6. Leukocytosis and thrombocytosis-WBC this am 28,700 and platelets 936,000. she has a history of polycythemia vera.  On Cefepime  for possible HCAP. Respiratory cultures showed normal respiratory flora;no Staph aureus or Pseudomonas. Remains afebrile. Will stop Cefepime . As discussed with heme/onc yesterday, Hydrea  restarted at 1000 mg daily. Ferritin is 154,iron  is 25, and TIBC 291. She has lab checks and follow up with heme/oncology post op 7. Confusion yesterday (did  not occur with hypoglycemia episode)-no focal neuro deficit. Will check UA with history of UTI (per son). Will avoid Oxy and give Tylenol  and Ultram  PRN pain. 8. Hopefully, insurance will approve CIR, once ready for discharge.  Katelyn M ZimmermanPA-C 7:51 AM   Agree Medical optimization Dispo planning  Katelyn Cline

## 2023-11-11 NOTE — Progress Notes (Signed)
 Mobility Specialist Progress Note:    11/11/23 1502  Mobility  Activity Transferred from bed to chair  Level of Assistance Minimal assist, patient does 75% or more  Assistive Device Other (Comment) (HHA)  Distance Ambulated (ft) 5 ft  Activity Response Tolerated well  Mobility Referral Yes  Mobility visit 1 Mobility  Mobility Specialist Start Time (ACUTE ONLY) 1446  Mobility Specialist Stop Time (ACUTE ONLY) 1449  Mobility Specialist Time Calculation (min) (ACUTE ONLY) 3 min   Pt received in bed, requesting assistance to transfer B>C. Tolerated well, asx throughout. Required MinA to stand and pivot. Left pt in chair with all needs met, son at bedside.    Mirela Parsley Mobility Specialist Please contact via Special educational needs teacher or  Rehab office at [phone]

## 2023-11-11 NOTE — Plan of Care (Signed)
  Problem: Education: Goal: Understanding of cardiac disease, CV risk reduction, and recovery process will improve Outcome: Progressing   Problem: Activity: Goal: Ability to tolerate increased activity will improve Outcome: Progressing   Problem: Health Behavior/Discharge Planning: Goal: Ability to safely manage health-related needs after discharge will improve Outcome: Progressing

## 2023-11-11 NOTE — Progress Notes (Addendum)
 Patient Name: Katelyn Cline Date of Encounter: 11/11/2023 [facility] Cardiologist: Lauro Portal, MD    Interval Summary  .    Patient reports feeling poorly this AM. She has not gotten out of bed all night so she feels very stiff. She also continues to feel short of breath. Has some pain near her sternal incision   Vital Signs .    Vitals:   11/10/23 2202 11/10/23 2337 11/11/23 0300 11/11/23 0401  BP:  99/86  130/66  Pulse: 81 (!) 56  (!) 59  Resp: 16 20  20   Temp:  98.6 F (37 C)  98.2 F (36.8 C)  TempSrc:  Oral  Oral  SpO2: 100% 97%  96%  Weight:   100 kg   Height:        Intake/Output Summary (Last 24 hours) at 11/11/2023 0835 Last data filed at 11/10/2023 1400 Gross per 24 hour  Intake 480 ml  Output 700 ml  Net -220 ml      11/11/2023    3:00 AM 11/10/2023    2:26 AM 11/09/2023    5:00 AM  Last 3 Weights  Weight (lbs) 220 lb 7.4 oz 227 lb 1.2 oz 217 lb 2.5 oz  Weight (kg) 100 kg 103 kg 98.5 kg      Telemetry/ECG    NSR - Personally Reviewed  Physical Exam .   GEN: No acute distress.  Laying in the bed with head elevated  Neck: No JVD Cardiac:  RRR, no murmurs, rubs, or gallops.  Respiratory: Crackles in bilateral lung bases, much more significant in left lower lung. Normal WOB on 3 L via [place]  GI: Soft, nontender, non-distended  MS: Trace edema in RLE, no edema in LLE   Assessment & Plan .     Acute on chronic HFpEF Moderate left pleural effusion  - Echocardiogram 11/08/2023 showed EF 60-65%, no regional wall motion abnormalities, mild LVH, grade 2 DD.  There was DD shaped septum suggestive of RV pressure/volume overload, mildly reduced RV systolic function - Treated with IV lasix - put out 0.7 L urine yesterday after receiving IV lasix  40 mg.  - We have been trying to transition to PO lasix . Patient continues to be volume overloaded on exam today with crackles in lungs, RLE edema. Also continues to be short of breath despite thoracentesis  yesterday and is on 3 L oxygen   - Ordered IV lasix  80 mg to be given today  - Strict I/Os, daily weights. - BMP pending today. CXR pending this AM  - Underwent L thoracentesis yesterday - yielded 1 L bloody fluid  - Consider SGL2i once recovered from surgery  - Start spironolactone 12.5 mg daily    CAD  - Underwent CABG x3 with bioprosthetic AVR and LAA clip 11/03/23  - Post-op care per CT surgery  - Continue ASA 81 mg daily, lipitor 80 mg daily, metoprolol  tartrate 25 mg BID   PAF - Maintaining NSR  - Continue eliquis  5 mg BID  - Continue metoprolol  tartrate 12.5 mg BID    HTN  - Continue current regiment with addition of spironolactone 12.5 mg daily    Otherwise per primary  - Acute on chronic hypoxic respiratory failure  - Leukocytosis  - COPD  - Possible OSA and OHS  - Anemia  - PAD  - Chronic Pain  - Type 2 DM  - Debility   For questions or updates, please contact  [facility] Please consult [email or web address] for contact  info under    Signed, Katelyn Fang, PA-C   Patient seen and examined, note reviewed with the signed Advanced Practice Provider. I personally reviewed laboratory data, imaging studies and relevant notes. I independently examined the patient and formulated the important aspects of the plan. I have personally discussed the plan with the patient and/or family. Comments or changes to the note/plan are indicated below.  She is currently short of breath. S/p thoracocentesis yesterday Agree she can benefit from IV Lasix  today  We will closely monitor. Continue Lopressor  12.5 mg BID, Eliquis  5 mg BID.     [name] DO, MS [facility] Attending Cardiologist [facility]  [address] [place], [place] [address] [phone] Website: [email or web address]

## 2023-11-11 NOTE — Progress Notes (Signed)
 Physical Therapy Treatment Patient Details Name: Katelyn Cline MRN: [id number] DOB: 13-Nov-1960 Today's Date: 11/11/2023   History of Present Illness 63 y.o. female presented 10/25/23 for the evaluation of unstable angina. 5/7 CABG x3, AVR; extubated 5/11; BiPAP 5/11  PMH- 3v CAD, atrial fibrillation, COPD oxygen  dependent 4L, 100 pack year smoking history, diabetes, HTN, HLD, PAD, moderate aortic stenosis, agoraphobia, fibromyalgia, narcolepsy, peripheral neuropathy    PT Comments  Pt resting in bed on arrival and agreeable to session with slow progress towards acute goals as pt with increased fatigue, and pt c/o of general malaise and "feeling bad". Pt able to demonstrate slightly increased gait distance with RW for support and min A to steady. Pt continues to require max cues for recall and adherence to sternal precautions throughout mobility, especially with transfers sit<>stand with pt demonstrating poor carryover from start to end of session. Encouraged and educated pt on importance of frequent mobility and time up OOB with pt verbalizing understanding however declining time up in chair at end of session. Pt continues to benefit from skilled PT services to progress toward functional mobility goals.     If plan is discharge home, recommend the following: Assistance with cooking/housework;Assist for transportation;Help with stairs or ramp for entrance   Can travel by private vehicle        Equipment Recommendations  Rolling walker (2 wheels)    Recommendations for Other Services       Precautions / Restrictions Precautions Precautions: Sternal;Fall Precaution Booklet Issued: Yes (comment) Recall of Precautions/Restrictions: Impaired Precaution/Restrictions Comments: max cues for adherence to sternal precautions Restrictions Weight Bearing Restrictions Per Provider Order:  (sternal precautions)     Mobility  Bed Mobility Overal bed mobility: Needs Assistance Bed Mobility:  Rolling, Sidelying to Sit, Sit to Sidelying Rolling: Min assist Sidelying to sit: Min assist     Sit to sidelying: Min assist General bed mobility comments: min A to roll to R and elevate trunk to sitting    Transfers Overall transfer level: Needs assistance Equipment used: Rolling walker (2 wheels) Transfers: Sit to/from Stand, Bed to chair/wheelchair/BSC Sit to Stand: Mod assist, Min assist   Step pivot transfers: Min assist       General transfer comment: cues for hands on knees and to use momentum, assist to rise and steady with poor carryover with each trasnfer    Ambulation/Gait Ambulation/Gait assistance: Min assist Gait Distance (Feet): 24 Feet Assistive device: Rolling walker (2 wheels) Gait Pattern/deviations: Step-through pattern, Decreased stride length Gait velocity: decr     General Gait Details: light min A to steady and manage RW, pt declining further distance due to "feeling bad"   Stairs             Wheelchair Mobility     Tilt Bed    Modified Rankin (Stroke Patients Only)       Balance Overall balance assessment: Needs assistance Sitting-balance support: No upper extremity supported, Feet unsupported Sitting balance-Leahy Scale: Good     Standing balance support: Bilateral upper extremity supported Standing balance-Leahy Scale: Poor Standing balance comment: reliant on UE support                            Communication Communication Communication: No apparent difficulties  Cognition Arousal: Alert Behavior During Therapy: WFL for tasks assessed/performed   PT - Cognitive impairments: Sequencing, Problem solving, Safety/Judgement  Following commands: Intact      Cueing Cueing Techniques: Verbal cues  Exercises      General Comments General comments (skin integrity, edema, etc.): on 3L O2 (as at home) with poor pleth while ambulating. >90% when good waveform      Pertinent  Vitals/Pain Pain Assessment Pain Assessment: Faces Faces Pain Scale: Hurts a little bit Pain Location: chest incision, back, neck Pain Descriptors / Indicators: Sore Pain Intervention(s): Monitored during session, Limited activity within patient's tolerance    Home Living     Available Help at Discharge: Family;Available PRN/intermittently Type of Home: House                  Prior Function            PT Goals (current goals can now be found in the care plan section) Acute Rehab PT Goals Patient Stated Goal: return home PT Goal Formulation: With patient Time For Goal Achievement: 11/22/23 Progress towards PT goals: Progressing toward goals    Frequency    Min 2X/week      PT Plan      Co-evaluation              AM-PAC PT "6 Clicks" Mobility   Outcome Measure  Help needed turning from your back to your side while in a flat bed without using bedrails?: A Little Help needed moving from lying on your back to sitting on the side of a flat bed without using bedrails?: A Little Help needed moving to and from a bed to a chair (including a wheelchair)?: A Lot Help needed standing up from a chair using your arms (e.g., wheelchair or bedside chair)?: A Lot Help needed to walk in hospital room?: Total Help needed climbing 3-5 steps with a railing? : Total 6 Click Score: 12    End of Session Equipment Utilized During Treatment: Gait belt;Oxygen  Activity Tolerance: Patient tolerated treatment well Patient left: with call bell/phone within reach;in bed;with bed alarm set Nurse Communication: Mobility status (sucessful void) PT Visit Diagnosis: Difficulty in walking, not elsewhere classified (R26.2)     Time: 4098-1191 PT Time Calculation (min) (ACUTE ONLY): 19 min  Charges:    $Therapeutic Activity: 8-22 mins PT General Charges $$ ACUTE PT VISIT: 1 Visit                     Antony Baumgartner R. PTA Acute Rehabilitation Services Office: [phone]   Agapito Horseman 11/11/2023, 12:52 PM

## 2023-11-12 ENCOUNTER — Other Ambulatory Visit: Payer: Self-pay | Admitting: Cardiology

## 2023-11-12 ENCOUNTER — Inpatient Hospital Stay (HOSPITAL_COMMUNITY)

## 2023-11-12 ENCOUNTER — Telehealth: Admitting: Thoracic Surgery (Cardiothoracic Vascular Surgery)

## 2023-11-12 DIAGNOSIS — Z952 Presence of prosthetic heart valve: Secondary | ICD-10-CM

## 2023-11-12 DIAGNOSIS — I200 Unstable angina: Secondary | ICD-10-CM | POA: Diagnosis not present

## 2023-11-12 LAB — CBC
HCT: 32.6 % — ABNORMAL LOW (ref 36.0–46.0)
Hemoglobin: 10.2 g/dL — ABNORMAL LOW (ref 12.0–15.0)
MCH: 29.1 pg (ref 26.0–34.0)
MCHC: 31.3 g/dL (ref 30.0–36.0)
MCV: 92.9 fL (ref 80.0–100.0)
Platelets: 1052 10*3/uL (ref 150–400)
RBC: 3.51 MIL/uL — ABNORMAL LOW (ref 3.87–5.11)
RDW: 19.2 % — ABNORMAL HIGH (ref 11.5–15.5)
WBC: 26.8 10*3/uL — ABNORMAL HIGH (ref 4.0–10.5)
nRBC: 0.6 % — ABNORMAL HIGH (ref 0.0–0.2)

## 2023-11-12 LAB — GLUCOSE, CAPILLARY
Glucose-Capillary: 125 mg/dL — ABNORMAL HIGH (ref 70–99)
Glucose-Capillary: 103 mg/dL — ABNORMAL HIGH (ref 70–99)
Glucose-Capillary: 113 mg/dL — ABNORMAL HIGH (ref 70–99)
Glucose-Capillary: 125 mg/dL — ABNORMAL HIGH (ref 70–99)

## 2023-11-12 MED ORDER — INSULIN ASPART 100 UNIT/ML IJ SOLN
0.0000 [IU] | Freq: Three times a day (TID) | INTRAMUSCULAR | Status: DC
  Administered 2023-11-13 (×2): 1 [IU] via SUBCUTANEOUS
  Administered 2023-11-15: 2 [IU] via SUBCUTANEOUS
  Administered 2023-11-16 – 2023-11-21 (×4): 1 [IU] via SUBCUTANEOUS
  Administered 2023-11-22: 2 [IU] via SUBCUTANEOUS
  Administered 2023-11-23: 1 [IU] via SUBCUTANEOUS

## 2023-11-12 MED ORDER — POTASSIUM CHLORIDE CRYS ER 20 MEQ PO TBCR
20.0000 meq | EXTENDED_RELEASE_TABLET | Freq: Once | ORAL | Status: AC
  Administered 2023-11-12: 20 meq via ORAL
  Filled 2023-11-12: qty 1

## 2023-11-12 MED ORDER — LISINOPRIL 5 MG PO TABS
5.0000 mg | ORAL_TABLET | Freq: Every day | ORAL | Status: DC
  Administered 2023-11-12 – 2023-11-18 (×7): 5 mg via ORAL
  Filled 2023-11-12 (×7): qty 1

## 2023-11-12 MED ORDER — FUROSEMIDE 10 MG/ML IJ SOLN
40.0000 mg | Freq: Once | INTRAMUSCULAR | Status: AC
  Administered 2023-11-12: 40 mg via INTRAVENOUS
  Filled 2023-11-12: qty 4

## 2023-11-12 MED ORDER — GLUCERNA SHAKE PO LIQD
237.0000 mL | Freq: Three times a day (TID) | ORAL | Status: DC
  Administered 2023-11-17: 237 mL via ORAL
  Filled 2023-11-12: qty 237

## 2023-11-12 NOTE — Progress Notes (Addendum)
 [address]       [place] [address]             [phone]        9 Days Post-Op Procedure(s) (LRB): EXPLORATION POST OPERATIVE OPEN HEART; WASHOUT (N/A) ECHOCARDIOGRAM, TRANSESOPHAGEAL (N/A)  Subjective: Patient states breathing is ok. She does does not have much appetite and is eating "a little"  Objective: Vital signs in last 24 hours: Temp:  [97.8 F (36.6 C)-98.6 F (37 C)] 97.8 F (36.6 C) (05/16 0415) Pulse Rate:  [60-66] 60 (05/16 0415) Cardiac Rhythm: Normal sinus rhythm (05/15 2150) Resp:  [14-21] 21 (05/16 0457) BP: (97-128)/(51-84) 97/84 (05/16 0415) SpO2:  [96 %-99 %] 99 % (05/16 0415) Weight:  [100.7 kg] 100.7 kg (05/16 0457)  Pre op weight 98.5 kg Current Weight  11/12/23 100.7 kg      Intake/Output from previous day: 05/15 0701 - 05/16 0700 In: 480 [P.O.:480] Out: 1850 [Urine:1850]   Physical Exam: Cardiovascular: RRR Pulmonary: Slightly diminished left base and right lung is clear Abdomen: Soft, obese, non tender, bowel sounds present. Extremities: Mild bilateral lower extremity edema. Wounds: Clean and dry.  No erythema or signs of infection.  Lab Results: CBC: Recent Labs    11/11/23 0352 11/12/23 0630  WBC 28.7* 26.8*  HGB 9.7* 10.2*  HCT 31.7* 32.6*  PLT 936* 1,052*   BMET:  Recent Labs    11/10/23 0916 11/10/23 1750 11/11/23 1005  NA 138 137 137  K 4.5 3.6 3.7  CL 106  --  104  CO2 27  --  23  GLUCOSE 82  --  110*  BUN 13  --  13  CREATININE 0.63  --  0.55  CALCIUM  8.6*  --  8.3*    PT/INR:  Lab Results  Component Value Date   INR 2.1 (H) 11/03/2023   INR 1.5 (H) 11/03/2023   INR 1.1 10/28/2023   ABG:  INR: Will add last result for INR, ABG once components are confirmed Will add last 4 CBG results once components are confirmed  Assessment/Plan:  1. CV - History of a paroxysmal atrial fibrillation. Maintaining SR. On Lisinopril  10 mg daily, Lopressor  12.5 mg bid, and Apixaban  5 mg  bid. Will decrease Lisinopril  5 mg because of more labile BP;may have to stop if labile BP continues 2.  Pulmonary - History of COPD, OSA. Continue Brovana , Duoneb, and Yupelri . On 2 liters of oxygen  via [place]. She was on oxygen  at home prior to admission. S/p left thoracentesis 05/14 with 1L of bloody fluid removed. Check PA/LAT CXR. Encourage incentive spirometer.  3. Acute on chronic diastolic heart failure-She was given 80 IV Lasix  yesterday. BP labile so will re assess for Lasix  later in the day 4.  Expected post op acute blood loss anemia - H and H yesterday stable at 10.2 and 32.6 5. DM-CBGs 149/153/125. Scheduled Insulin  stopped  to avoid further hypoglycemia. I changed sliding scale from resistant to sensitive. Pre op HGA1C 5.1. She was on Metformin  1000 mg bid prior to admission and will restart soon. Will try Glucerna to help with appetite/oral intake 6. Leukocytosis and thrombocytosis-WBC this am 26,800 and platelets 1,052,000. She has a history of polycythemia vera.  On Cefepime  for possible HCAP.  As discussed with heme/onc, Hydrea  restarted at 1000 mg daily. Ferritin is 154,iron  is 25, and TIBC 291. She has lab checks and follow up with heme/oncology post op 7. Hopefully, insurance will approve CIR; Anticipated  discharge Monday  Katelyn Cline 7:30 AM    Agree Dispo planning  Katelyn Cline

## 2023-11-12 NOTE — Progress Notes (Addendum)
 Mobility Specialist Progress Note:    11/12/23 1142  Mobility  Activity Ambulated with assistance to bathroom;Ambulated with assistance in room;Transferred from bed to chair  Level of Assistance Moderate assist, patient does 50-74%  Assistive Device Front wheel walker  Distance Ambulated (ft) 24 ft  RUE Weight Bearing Per Provider Order NWB  LUE Weight Bearing Per Provider Order NWB  Activity Response Tolerated well  Mobility Referral Yes  Mobility visit 1 Mobility  Mobility Specialist Start Time (ACUTE ONLY) 1142  Mobility Specialist Stop Time (ACUTE ONLY) 1153  Mobility Specialist Time Calculation (min) (ACUTE ONLY) 11 min   Pt received in bed eager for mobility session. Requested to use bathroom, ModA required to stand from bed with RW. Ambulated to bathroom. Void successful. Assisted pt to chair in room, linens changes. Left pt in chair with all needs met, call  bell and phone in reach, all needs met.  Katelyn Cline Mobility Specialist Please contact via Special educational needs teacher or  Rehab office at [phone]

## 2023-11-12 NOTE — Progress Notes (Addendum)
 Patient Name: Katelyn Cline Date of Encounter: 11/12/2023 [facility] Cardiologist: Lauro Portal, MD    Interval Summary  .    Patient reports feeling well this AM. Has some pain near her sternum, no pain otherwise. Breathing is a bit better than yesterday. Bilateral ankles and feet are swollen   Vital Signs .    Vitals:   11/12/23 0415 11/12/23 0457 11/12/23 0832 11/12/23 0900  BP: 97/84  121/69   Pulse: 60   65  Resp: 20 (!) 21 (!) 24 (!) 22  Temp: 97.8 F (36.6 C)  98 F (36.7 C)   TempSrc: Oral  Oral   SpO2: 99%  98% 97%  Weight:  100.7 kg    Height:        Intake/Output Summary (Last 24 hours) at 11/12/2023 0938 Last data filed at 11/11/2023 2152 Gross per 24 hour  Intake 480 ml  Output 1600 ml  Net -1120 ml      11/12/2023    4:57 AM 11/11/2023    3:00 AM 11/10/2023    2:26 AM  Last 3 Weights  Weight (lbs) 222 lb 0.1 oz 220 lb 7.4 oz 227 lb 1.2 oz  Weight (kg) 100.7 kg 100 kg 103 kg      Telemetry/ECG    NSR with occasional PVCs- Personally Reviewed  Physical Exam .   GEN: No acute distress.  Laying in the bed with head elevated  Neck: No JVD Cardiac:  RRR. Faint systolic murmur at RUSB  Respiratory: Clear to auscultation bilaterally. GI: Soft, nontender, non-distended  MS: No edema  Assessment & Plan .     Acute on chronic HFpEF Moderate left pleural effusion  - Echocardiogram 11/08/2023 showed EF 60-65%, no regional wall motion abnormalities, mild LVH, grade 2 DD.  There was DD shaped septum suggestive of RV pressure/volume overload, mildly reduced RV systolic function - Treated with IV lasix   - We have been trying to transition to PO lasix  but she has continued to be hypervolemic on exam. Received IV lasix  80 mg yesterday. Put out at least 1.85 L urine  - Continues to be hypervolemic on exam today. Agree with IV lasix  40 mg today  - Strict I/Os, daily weights. - Underwent L thoracentesis 5/14 - yielded 1 L bloody fluid  - Consider  SGL2i once recovered from surgery  - Continue spironolactone 12.5 mg daily    CAD  - Underwent CABG x3 with bioprosthetic AVR and LAA clip 11/03/23  - Post-op care per CT surgery  - Continue ASA 81 mg daily, lipitor 80 mg daily, metoprolol  tartrate 25 mg BID   PAF - Maintaining NSR  - Continue eliquis  5 mg BID  - Continue metoprolol  tartrate 12.5 mg BID    HTN  - Continue current regiment     Otherwise per primary  - Acute on chronic hypoxic respiratory failure  - Leukocytosis  - COPD  - Possible OSA and OHS  - Anemia  - PAD  - Chronic Pain  - Type 2 DM  - Debility  For questions or updates, please contact [facility] Please consult [email or web address] for contact info under      Signed, Debria Fang, PA-C   Patient seen and examined, note reviewed with the signed Advanced Practice Provider. I personally reviewed laboratory data, imaging studies and relevant notes. I independently examined the patient and formulated the important aspects of the plan. I have personally discussed the plan with the patient and/or  family. Comments or changes to the note/plan are indicated below.  Patient seen and examined at her bedside.  Still short of breath and clinically hypervolemic - agree with additional dosing of IV Lasix . No angina symptoms.  Cont current dose of Aspirin , Aldactone, Lopressor  and Eliquis    Skylynne Schlechter DO, MS [facility] Attending Cardiologist [facility]  [address] [place], [place] [address] [phone] Website: [email or web address]

## 2023-11-12 NOTE — Progress Notes (Signed)
 Post op echo ordered

## 2023-11-12 NOTE — Progress Notes (Signed)
 Inpatient Rehab Admissions Coordinator:   Reviewed with Dr. Rachel Budds this AM and he can do peer to peer if [facility] requests.  I provided them with his number.  I will follow.   Loye Rumble, PT, DPT Admissions Coordinator [phone] 11/12/23  11:49 AM

## 2023-11-13 DIAGNOSIS — I5033 Acute on chronic diastolic (congestive) heart failure: Secondary | ICD-10-CM | POA: Diagnosis not present

## 2023-11-13 DIAGNOSIS — I2510 Atherosclerotic heart disease of native coronary artery without angina pectoris: Secondary | ICD-10-CM | POA: Diagnosis not present

## 2023-11-13 DIAGNOSIS — Z952 Presence of prosthetic heart valve: Secondary | ICD-10-CM

## 2023-11-13 DIAGNOSIS — Z951 Presence of aortocoronary bypass graft: Secondary | ICD-10-CM | POA: Diagnosis not present

## 2023-11-13 LAB — CBC
HCT: 31.7 % — ABNORMAL LOW (ref 36.0–46.0)
Hemoglobin: 9.7 g/dL — ABNORMAL LOW (ref 12.0–15.0)
MCH: 28.3 pg (ref 26.0–34.0)
MCHC: 30.6 g/dL (ref 30.0–36.0)
MCV: 92.4 fL (ref 80.0–100.0)
Platelets: 1075 10*3/uL (ref 150–400)
RBC: 3.43 MIL/uL — ABNORMAL LOW (ref 3.87–5.11)
RDW: 19 % — ABNORMAL HIGH (ref 11.5–15.5)
WBC: 23.5 10*3/uL — ABNORMAL HIGH (ref 4.0–10.5)
nRBC: 0.7 % — ABNORMAL HIGH (ref 0.0–0.2)

## 2023-11-13 LAB — GLUCOSE, CAPILLARY
Glucose-Capillary: 110 mg/dL — ABNORMAL HIGH (ref 70–99)
Glucose-Capillary: 134 mg/dL — ABNORMAL HIGH (ref 70–99)
Glucose-Capillary: 130 mg/dL — ABNORMAL HIGH (ref 70–99)
Glucose-Capillary: 106 mg/dL — ABNORMAL HIGH (ref 70–99)

## 2023-11-13 MED ORDER — FUROSEMIDE 10 MG/ML IJ SOLN
80.0000 mg | Freq: Two times a day (BID) | INTRAMUSCULAR | Status: DC
  Administered 2023-11-13 – 2023-11-14 (×2): 80 mg via INTRAVENOUS
  Filled 2023-11-13 (×2): qty 8

## 2023-11-13 MED ORDER — FUROSEMIDE 10 MG/ML IJ SOLN
40.0000 mg | Freq: Once | INTRAMUSCULAR | Status: AC
  Administered 2023-11-13: 40 mg via INTRAVENOUS
  Filled 2023-11-13: qty 4

## 2023-11-13 MED ORDER — FUROSEMIDE 10 MG/ML IJ SOLN: 80.0000 mg | Freq: Two times a day (BID) | INTRAMUSCULAR | Status: DC

## 2023-11-13 NOTE — Progress Notes (Signed)
 Post OHS education including site care, restrictions, heart healthy diabetic diet, sternal precautions, IS use, exercise guidelines and CRP2 reviewed. All questions and concerns addressed. Will refer to [facility] for CRP2. Will continue to follow.    [id number] Katelyn Cline, RRT 11/13/2023 9:31 AM

## 2023-11-13 NOTE — Progress Notes (Signed)
 Occupational Therapy Treatment Patient Details Name: Katelyn Cline MRN: [id number] DOB: 01-May-1961 Today's Date: 11/13/2023   History of present illness 63 y.o. female presented 10/25/23 for the evaluation of unstable angina. 5/7 CABG x3, AVR; extubated 5/11; BiPAP 5/11  PMH- 3v CAD, atrial fibrillation, COPD oxygen  dependent 4L, 100 pack year smoking history, diabetes, HTN, HLD, PAD, moderate aortic stenosis, agoraphobia, fibromyalgia, narcolepsy, peripheral neuropathy   OT comments  Pt. Seen for skilled OT treatment session.  Cont. Work on integration of sternal precautions during mobility and ADLs.  Pt. With notable difficulty with sit/stand while maintaining sternal precautions.  MOD A from elevated surface with max cues not to push through BUEs.  Momentum and therapist assistance required to come into standing.  Pt. With step pivot to recliner.  Max cues for sequencing for safer pivot direction.  Cont. With acute OT POC.        If plan is discharge home, recommend the following:  A lot of help with bathing/dressing/bathroom;Assistance with cooking/housework;Assist for transportation;Help with stairs or ramp for entrance;A little help with walking and/or transfers;Direct supervision/assist for medications management;Direct supervision/assist for financial management   Equipment Recommendations  BSC/3in1    Recommendations for Other Services Rehab consult    Precautions / Restrictions Precautions Precautions: Sternal;Fall Precaution Booklet Issued: Yes (comment) Recall of Precautions/Restrictions: Impaired Precaution/Restrictions Comments: max cues for adherence to sternal precautions Restrictions RUE Weight Bearing Per Provider Order: Non weight bearing LUE Weight Bearing Per Provider Order: Non weight bearing       Mobility Bed Mobility               General bed mobility comments: seated eob at beginning of session    Transfers Overall transfer level: Needs  assistance   Transfers: Sit to/from Stand Sit to Stand: Mod assist     Step pivot transfers: Min assist, Mod assist     General transfer comment: cues for hands on knees and to use momentum, assist to rise and steady with poor carryover with each trasnfer-cues for proper turn for safe tranfer to recliner. pt. turned full circle even with cues     Balance                                           ADL either performed or assessed with clinical judgement   ADL Overall ADL's : Needs assistance/impaired                         Toilet Transfer: Minimal assistance;Stand-pivot;Cueing for sequencing;Cueing for safety Toilet Transfer Details (indicate cue type and reason): simulated with transfer from eob to recliner with pivotal steps.  max cues for shortest/safer transfer direction. pt. transfering to the R but went to the L and made full circle before sitting vs. reaching and initiating turn to the right.  reviewed safety concerns with extended circles         Functional mobility during ADLs: Minimal assistance;Cueing for safety;Cueing for sequencing General ADL Comments: cues for sternal precautions, great difficulty with sit/stands without attempting to push through BUEs    Extremity/Trunk Assessment              Vision       Perception     Praxis     Communication Communication Communication: No apparent difficulties   Cognition Arousal: Alert Behavior During Therapy: [facility] for  tasks assessed/performed                                 Following commands: Intact        Cueing   Cueing Techniques: Verbal cues  Exercises      Shoulder Instructions       General Comments      Pertinent Vitals/ Pain       Pain Assessment Pain Assessment: Faces Faces Pain Scale: Hurts a little bit Pain Location: chest incision, back, neck Pain Descriptors / Indicators: Sore Pain Intervention(s): Limited activity within patient's  tolerance, Repositioned  Home Living                                          Prior Functioning/Environment              Frequency  Min 2X/week        Progress Toward Goals  OT Goals(current goals can now be found in the care plan section)  Progress towards OT goals: Progressing toward goals     Plan      Co-evaluation                 AM-PAC OT "6 Clicks" Daily Activity     Outcome Measure   Help from another person eating meals?: A Little Help from another person taking care of personal grooming?: A Lot Help from another person toileting, which includes using toliet, bedpan, or urinal?: A Lot Help from another person bathing (including washing, rinsing, drying)?: A Lot Help from another person to put on and taking off regular upper body clothing?: A Little Help from another person to put on and taking off regular lower body clothing?: A Lot 6 Click Score: 14    End of Session Equipment Utilized During Treatment: Oxygen   OT Visit Diagnosis: Unsteadiness on feet (R26.81);Other abnormalities of gait and mobility (R26.89);Muscle weakness (generalized) (M62.81);Other symptoms and signs involving cognitive function   Activity Tolerance Patient tolerated treatment well   Patient Left in chair;with call bell/phone within reach   Nurse Communication Other (comment) (reviewed with RN pt. up in recliner, and difficulty with sit/stand while maintaining precautions)        Time: 1610-9604 OT Time Calculation (min): 16 min  Charges: OT General Charges $OT Visit: 1 Visit OT Treatments $Self Care/Home Management : 8-22 mins  Howell Macintosh, COTA/L Acute Rehabilitation [phone]   Leory Rands Lorraine-COTA/L  11/13/2023, 1:58 PM

## 2023-11-13 NOTE — Progress Notes (Addendum)
 [address]       [place] [address]             [phone]        10 Days Post-Op Procedure(s) (LRB): EXPLORATION POST OPERATIVE OPEN HEART; WASHOUT (N/A) ECHOCARDIOGRAM, TRANSESOPHAGEAL (N/A)  Subjective: Patient has been in the bathroom. She had a bowel movement.   Objective: Vital signs in last 24 hours: Temp:  [97.9 F (36.6 C)-98.4 F (36.9 C)] 98.1 F (36.7 C) (05/17 0739) Pulse Rate:  [60-65] 63 (05/17 0739) Cardiac Rhythm: Normal sinus rhythm;Bundle branch block (05/16 2130) Resp:  [17-24] 19 (05/17 0739) BP: (114-139)/(57-74) 139/60 (05/17 0739) SpO2:  [96 %-100 %] 96 % (05/17 0739) Weight:  [100.4 kg] 100.4 kg (05/17 0320)  Pre op weight 98.5 kg Current Weight  11/13/23 100.4 kg      Intake/Output from previous day: 05/16 0701 - 05/17 0700 In: -  Out: 1600 [Urine:1600]   Physical Exam: Cardiovascular: RRR Pulmonary: Slightly diminished left base and right lung is clear Abdomen: Soft, obese, non tender, bowel sounds present. Extremities: Bilateral ankle edema Wounds: Clean and dry.  No erythema or signs of infection.  Lab Results: CBC: Recent Labs    11/12/23 0630 11/13/23 0311  WBC 26.8* 23.5*  HGB 10.2* 9.7*  HCT 32.6* 31.7*  PLT 1,052* 1,075*   BMET:  Recent Labs    11/10/23 0916 11/10/23 1750 11/11/23 1005  NA 138 137 137  K 4.5 3.6 3.7  CL 106  --  104  CO2 27  --  23  GLUCOSE 82  --  110*  BUN 13  --  13  CREATININE 0.63  --  0.55  CALCIUM  8.6*  --  8.3*    PT/INR:  Lab Results  Component Value Date   INR 2.1 (H) 11/03/2023   INR 1.5 (H) 11/03/2023   INR 1.1 10/28/2023   ABG:  INR: Will add last result for INR, ABG once components are confirmed Will add last 4 CBG results once components are confirmed  Assessment/Plan:  1. CV - History of a paroxysmal atrial fibrillation. Maintaining SR. On Lisinopril  5 mg daily, Lopressor  12.5 mg bid, and Apixaban  5 mg bid.  2.  Pulmonary - History of  COPD, OSA. Continue Brovana , Duoneb, and Yupelri . On 2 liters of oxygen  via [place]. She was on oxygen  at home prior to admission. S/p left thoracentesis 05/14 with 1L of bloody fluid removed.  Encourage incentive spirometer.  3. Acute on chronic diastolic heart failure-On Spironolactone  12.5 mg daily. Will give IV Lasix  40 mg today 4.  Expected post op acute blood loss anemia - H and H yesterday stable at 9.7 and 31.7 5. DM-CBGs 113/125/110. Scheduled Insulin  stopped to avoid further hypoglycemia. Sliding scale from resistant to sensitive yesterday. Pre op HGA1C 5.1. She was on Metformin  1000 mg bid prior to admission and will restart soon. Will try Glucerna to help with appetite/oral intake 6. Leukocytosis and thrombocytosis-She has a history of polycythemia vera. WBC this am 23,500 and platelets 1,075,000. .  As discussed with heme/onc, Hydrea  restarted at 1000 mg daily. Ferritin is 154,iron  is 25, and TIBC 291. She has lab checks and follow up with heme/oncology post op 7. Deconditioned-continue with CR.  8. Disposition-Hopefully, insurance will approve CIR; Anticipated discharge Monday. I discussed with the patient that if insurance does not approve CIR, would she be willing to go to SNF and she said no.  Donielle M ZimmermanPA-C 8:00 AM  Chart reviewed, patient examined, agree with above.  CXR this am shows small residual left effusion and left basilar atelectasis. Encouraged to continue using IS and ambulating.

## 2023-11-13 NOTE — Progress Notes (Addendum)
 Progress Note  Patient Name: Katelyn Cline Date of Encounter: 11/13/2023  Primary Cardiologist: Lauro Portal, MD  Subjective   No symptoms but patient reports she still has fluid on her.  Inpatient Medications    Scheduled Meds:  acetaminophen  (TYLENOL ) oral liquid 160 mg/5 mL  650 mg Per Tube Once   apixaban   5 mg Oral BID   arformoterol   15 mcg Nebulization BID   aspirin  EC  81 mg Oral Daily   Or   aspirin   81 mg Per Tube Daily   atorvastatin   80 mg Oral QHS   bisacodyl   10 mg Oral Daily   Or   bisacodyl   10 mg Rectal Daily   buPROPion  ER  100 mg Oral BID   docusate  200 mg Oral Daily   DULoxetine   60 mg Oral BID   feeding supplement (GLUCERNA SHAKE)  237 mL Oral TID WC   hydroxyurea   1,000 mg Oral Daily   insulin  aspart  0-9 Units Subcutaneous TID WC   lisinopril   5 mg Oral Daily   metoprolol  tartrate  12.5 mg Oral BID   pantoprazole  (PROTONIX ) IV  40 mg Intravenous Q12H   revefenacin   175 mcg Nebulization Daily   sodium chloride  flush  3 mL Intravenous Q12H   spironolactone   12.5 mg Oral Daily   Continuous Infusions:  PRN Meds: acetaminophen , hydrALAZINE , ipratropium-albuterol , metoprolol  tartrate, ondansetron  (ZOFRAN ) IV, mouth rinse, sodium chloride  flush, traMADol    Vital Signs    Vitals:   11/12/23 2337 11/13/23 0320 11/13/23 0739 11/13/23 0855  BP: (!) 138/57 131/65 139/60   Pulse: 63 60 63   Resp: 20 19 19    Temp: 98.4 F (36.9 C) 98 F (36.7 C) 98.1 F (36.7 C)   TempSrc: Oral Oral Oral   SpO2: 99% 98% 96% 94%  Weight:  100.4 kg    Height:        Intake/Output Summary (Last 24 hours) at 11/13/2023 1053 Last data filed at 11/13/2023 1000 Gross per 24 hour  Intake 240 ml  Output 1800 ml  Net -1560 ml   Filed Weights   11/11/23 0300 11/12/23 0457 11/13/23 0320  Weight: 100 kg 100.7 kg 100.4 kg    Telemetry     Personally reviewed.  NSR.  ECG    Not performed today.  Physical Exam   GEN: No acute distress.   Neck: Unable  to examine JVD due to body habitus. Cardiac: RRR, no murmur, rub, or gallop.  Respiratory: Nonlabored. Clear to auscultation bilaterally. GI: Soft, nontender, bowel sounds present. MS: 2+ pitting edema; No deformity. Neuro:  Nonfocal. Psych: Alert and oriented x 3. Normal affect.  Labs    Chemistry Recent Labs  Lab 11/09/23 0425 11/10/23 0916 11/10/23 1750 11/11/23 1005  NA 137 138 137 137  K 3.4* 4.5 3.6 3.7  CL 103 106  --  104  CO2 26 27  --  23  GLUCOSE 73 82  --  110*  BUN 19 13  --  13  CREATININE 0.59 0.63  --  0.55  CALCIUM  8.3* 8.6*  --  8.3*  GFRNONAA >60 >60  --  >60  ANIONGAP 8 5  --  10     Hematology Recent Labs  Lab 11/11/23 0352 11/12/23 0630 11/13/23 0311  WBC 28.7* 26.8* 23.5*  RBC 3.34* 3.51* 3.43*  HGB 9.7* 10.2* 9.7*  HCT 31.7* 32.6* 31.7*  MCV 94.9 92.9 92.4  MCH 29.0 29.1 28.3  MCHC 30.6 31.3  30.6  RDW 19.1* 19.2* 19.0*  PLT 936* 1,052* 1,075*    Cardiac Enzymes Recent Labs  Lab 10/25/23 0025 10/25/23 0220  TROPONINIHS 34* 45*    BNPNo results for input(s): "BNP", "PROBNP" in the last 168 hours.   DDimerNo results for input(s): "DDIMER" in the last 168 hours.   Radiology    DG Chest 2 View Result Date: 11/12/2023 CLINICAL DATA:  Shortness of breath, left pleural effusion. EXAM: CHEST - 2 VIEW COMPARISON:  Nov 10, 2023. FINDINGS: Stable cardiomegaly. Status post aortic valve repair. Right lung is clear. Small left pleural effusion is noted with adjacent subsegmental atelectasis. Bony thorax is unremarkable. IMPRESSION: Small left pleural effusion is noted with adjacent left basilar subsegmental atelectasis. Electronically Signed   By: Rosalene Colon M.D.   On: 11/12/2023 11:16    Assessment & Plan    Acute on chronic diastolic heart failure: Continues to be grossly volume overloaded, start IV Lasix  80 mg twice daily.  Left thoracentesis,S/p yielded 1L bloody fluid.  SGLT2 inhibitors when safe from surgery, continue  spironolactone  12.5 mg once daily.  CAD s/p three-vessel CABG, bioprosthetic AVR and left atrial appendage clip on 11/03/2023: CT surgery primary team.  Echocardiogram showed elevated mean AV gradient, 17 mmHg. Continue cardioprotective medications, aspirin  81 mg once daily, atorvastatin  80 mg nightly.  Paroxysmal A-fib: NSR on telemetry.  Continue metoprolol  titrate 12.5 mg twice daily and Eliquis  5 mg twice daily for now.  Underwent left atrial appendage clip placement at the time of CABG on 11/03/2023.  Discussion to come off of Eliquis  per outpatient cardiology.  HTN, controlled: Continue above medications.  Signed, Lasalle Pointer, MD  11/13/2023, 10:53 AM

## 2023-11-13 NOTE — Plan of Care (Signed)
  Problem: Education: Goal: Ability to describe self-care measures that may prevent or decrease complications (Diabetes Survival Skills Education) will improve Outcome: Progressing Goal: Individualized Educational Video(s) Outcome: Progressing   Problem: Coping: Goal: Ability to adjust to condition or change in health will improve Outcome: Progressing   Problem: Fluid Volume: Goal: Ability to maintain a balanced intake and output will improve Outcome: Progressing   Problem: Health Behavior/Discharge Planning: Goal: Ability to identify and utilize available resources and services will improve Outcome: Progressing Goal: Ability to manage health-related needs will improve Outcome: Progressing   Problem: Metabolic: Goal: Ability to maintain appropriate glucose levels will improve Outcome: Progressing   Problem: Nutritional: Goal: Maintenance of adequate nutrition will improve Outcome: Progressing Goal: Progress toward achieving an optimal weight will improve Outcome: Progressing   Problem: Skin Integrity: Goal: Risk for impaired skin integrity will decrease Outcome: Progressing   Problem: Tissue Perfusion: Goal: Adequacy of tissue perfusion will improve Outcome: Progressing   Problem: Education: Goal: Understanding of cardiac disease, CV risk reduction, and recovery process will improve Outcome: Progressing Goal: Individualized Educational Video(s) Outcome: Progressing   Problem: Activity: Goal: Ability to tolerate increased activity will improve Outcome: Progressing   Problem: Cardiac: Goal: Ability to achieve and maintain adequate cardiovascular perfusion will improve Outcome: Progressing   Problem: Health Behavior/Discharge Planning: Goal: Ability to safely manage health-related needs after discharge will improve Outcome: Progressing   Problem: Education: Goal: Knowledge of General Education information will improve Description: Including pain rating scale,  medication(s)/side effects and non-pharmacologic comfort measures Outcome: Progressing   Problem: Health Behavior/Discharge Planning: Goal: Ability to manage health-related needs will improve Outcome: Progressing   Problem: Clinical Measurements: Goal: Ability to maintain clinical measurements within normal limits will improve Outcome: Progressing Goal: Will remain free from infection Outcome: Progressing Goal: Diagnostic test results will improve Outcome: Progressing Goal: Respiratory complications will improve Outcome: Progressing Goal: Cardiovascular complication will be avoided Outcome: Progressing   Problem: Activity: Goal: Risk for activity intolerance will decrease Outcome: Progressing   Problem: Nutrition: Goal: Adequate nutrition will be maintained Outcome: Progressing   Problem: Coping: Goal: Level of anxiety will decrease Outcome: Progressing   Problem: Elimination: Goal: Will not experience complications related to bowel motility Outcome: Progressing Goal: Will not experience complications related to urinary retention Outcome: Progressing   Problem: Pain Managment: Goal: General experience of comfort will improve and/or be controlled Outcome: Progressing   Problem: Safety: Goal: Ability to remain free from injury will improve Outcome: Progressing   Problem: Skin Integrity: Goal: Risk for impaired skin integrity will decrease Outcome: Progressing   Problem: Education: Goal: Will demonstrate proper wound care and an understanding of methods to prevent future damage Outcome: Progressing Goal: Knowledge of disease or condition will improve Outcome: Progressing Goal: Knowledge of the prescribed therapeutic regimen will improve Outcome: Progressing Goal: Individualized Educational Video(s) Outcome: Progressing   Problem: Activity: Goal: Risk for activity intolerance will decrease Outcome: Progressing   Problem: Cardiac: Goal: Will achieve and/or  maintain hemodynamic stability Outcome: Progressing   Problem: Clinical Measurements: Goal: Postoperative complications will be avoided or minimized Outcome: Progressing   Problem: Respiratory: Goal: Respiratory status will improve Outcome: Progressing   Problem: Skin Integrity: Goal: Wound healing without signs and symptoms of infection Outcome: Progressing Goal: Risk for impaired skin integrity will decrease Outcome: Progressing   Problem: Urinary Elimination: Goal: Ability to achieve and maintain adequate renal perfusion and functioning will improve Outcome: Progressing   Problem: Safety: Goal: Non-violent Restraint(s) Outcome: Progressing

## 2023-11-14 DIAGNOSIS — I2510 Atherosclerotic heart disease of native coronary artery without angina pectoris: Secondary | ICD-10-CM | POA: Diagnosis not present

## 2023-11-14 DIAGNOSIS — Z951 Presence of aortocoronary bypass graft: Secondary | ICD-10-CM | POA: Diagnosis not present

## 2023-11-14 DIAGNOSIS — Z952 Presence of prosthetic heart valve: Secondary | ICD-10-CM | POA: Diagnosis not present

## 2023-11-14 DIAGNOSIS — I5033 Acute on chronic diastolic (congestive) heart failure: Secondary | ICD-10-CM | POA: Diagnosis not present

## 2023-11-14 LAB — GLUCOSE, CAPILLARY
Glucose-Capillary: 101 mg/dL — ABNORMAL HIGH (ref 70–99)
Glucose-Capillary: 121 mg/dL — ABNORMAL HIGH (ref 70–99)
Glucose-Capillary: 111 mg/dL — ABNORMAL HIGH (ref 70–99)
Glucose-Capillary: 144 mg/dL — ABNORMAL HIGH (ref 70–99)

## 2023-11-14 MED ORDER — FUROSEMIDE 10 MG/ML IJ SOLN
40.0000 mg | Freq: Two times a day (BID) | INTRAMUSCULAR | Status: DC
  Administered 2023-11-14 – 2023-11-15 (×2): 40 mg via INTRAVENOUS
  Filled 2023-11-14 (×2): qty 4

## 2023-11-14 NOTE — Plan of Care (Signed)
  Problem: Education: Goal: Ability to describe self-care measures that may prevent or decrease complications (Diabetes Survival Skills Education) will improve Outcome: Progressing Goal: Individualized Educational Video(s) Outcome: Progressing   Problem: Coping: Goal: Ability to adjust to condition or change in health will improve Outcome: Progressing   Problem: Skin Integrity: Goal: Risk for impaired skin integrity will decrease Outcome: Progressing   Problem: Activity: Goal: Ability to tolerate increased activity will improve Outcome: Progressing

## 2023-11-14 NOTE — Progress Notes (Addendum)
      [address]       [place] [address]             [phone]        11 Days Post-Op Procedure(s) (LRB): EXPLORATION POST OPERATIVE OPEN HEART; WASHOUT (N/A) ECHOCARDIOGRAM, TRANSESOPHAGEAL (N/A)  Subjective: Patient states she had a good night and already ate breakfast.  Objective: Vital signs in last 24 hours: Temp:  [97.8 F (36.6 C)-98.6 F (37 C)] 97.8 F (36.6 C) (05/18 0408) Pulse Rate:  [60-64] 60 (05/18 0408) Cardiac Rhythm: Normal sinus rhythm (05/17 1938) Resp:  [17-22] 19 (05/18 0408) BP: (110-137)/(51-75) 110/51 (05/18 0408) SpO2:  [94 %-100 %] 96 % (05/18 0408) Weight:  [98.6 kg] 98.6 kg (05/18 0408)  Pre op weight 98.5 kg Current Weight  11/14/23 98.6 kg      Intake/Output from previous day: 05/17 0701 - 05/18 0700 In: 720 [P.O.:720] Out: 2750 [Urine:2750]   Physical Exam: Cardiovascular: RRR, flow murmur Pulmonary: Slightly diminished left base and right lung is clear Abdomen: Soft, obese, non tender, bowel sounds present. Extremities: Bilateral ankle/feet edema but decreasing Wounds: Clean and dry.  No erythema or signs of infection.  Lab Results: CBC: Recent Labs    11/12/23 0630 11/13/23 0311  WBC 26.8* 23.5*  HGB 10.2* 9.7*  HCT 32.6* 31.7*  PLT 1,052* 1,075*   BMET:  Recent Labs    11/11/23 1005  NA 137  K 3.7  CL 104  CO2 23  GLUCOSE 110*  BUN 13  CREATININE 0.55  CALCIUM  8.3*    PT/INR:  Lab Results  Component Value Date   INR 2.1 (H) 11/03/2023   INR 1.5 (H) 11/03/2023   INR 1.1 10/28/2023   ABG:  INR: Will add last result for INR, ABG once components are confirmed Will add last 4 CBG results once components are confirmed  Assessment/Plan:  1. CV - History of a paroxysmal atrial fibrillation. Maintaining SR. On Lisinopril  5 mg daily, Lopressor  12.5 mg bid, and Apixaban  5 mg bid.  2.  Pulmonary - History of COPD, OSA. Continue Brovana , Duoneb, and Yupelri . On 2 liters of oxygen  via  [place]. She was on oxygen  at home prior to admission. S/p left thoracentesis 05/14 with 1L of bloody fluid removed.  Encourage incentive spirometer.  3. Acute on chronic diastolic heart failure-On Spironolactone  12.5 mg daily. Per cardiology, Lasix  80 mg IV bid 4.  Expected post op acute blood loss anemia - H and H yesterday stable at 9.7 and 31.7 5. DM-CBGs 130/106/101. Scheduled Insulin  previously stopped to avoid further hypoglycemia. Sliding scale changed from resistant to sensitive. Pre op HGA1C 5.1. She was on Metformin  1000 mg bid prior to admission and will restart soon. Will try Glucerna to help with appetite/oral intake 6. Leukocytosis and thrombocytosis-She has a history of polycythemia vera. WBC this am 23,500 and platelets 1,075,000. .  As discussed with heme/onc, Hydrea  restarted at 1000 mg daily. Ferritin is 154,iron  is 25, and TIBC 291. She has lab checks and follow up with heme/oncology post op 7. Deconditioned-continue with CR.  8. Disposition-Hopefully, insurance will approve CIR; Anticipated discharge Monday. I discussed with the patient that if insurance does not approve CIR, would she be willing to go to SNF and she said no.  Donielle M ZimmermanPA-C 7:47 AM   Chart reviewed, patient examined, agree with above.  She needs a lot of help with mobilization. Waiting to see if she can go to CIR.

## 2023-11-14 NOTE — Plan of Care (Signed)
  Problem: Education: Goal: Ability to describe self-care measures that may prevent or decrease complications (Diabetes Survival Skills Education) will improve Outcome: Progressing Goal: Individualized Educational Video(s) Outcome: Progressing   Problem: Coping: Goal: Ability to adjust to condition or change in health will improve Outcome: Progressing   Problem: Fluid Volume: Goal: Ability to maintain a balanced intake and output will improve Outcome: Progressing   Problem: Health Behavior/Discharge Planning: Goal: Ability to identify and utilize available resources and services will improve Outcome: Progressing Goal: Ability to manage health-related needs will improve Outcome: Progressing   Problem: Metabolic: Goal: Ability to maintain appropriate glucose levels will improve Outcome: Progressing   Problem: Nutritional: Goal: Maintenance of adequate nutrition will improve Outcome: Progressing Goal: Progress toward achieving an optimal weight will improve Outcome: Progressing   Problem: Skin Integrity: Goal: Risk for impaired skin integrity will decrease Outcome: Progressing   Problem: Tissue Perfusion: Goal: Adequacy of tissue perfusion will improve Outcome: Progressing   Problem: Education: Goal: Understanding of cardiac disease, CV risk reduction, and recovery process will improve Outcome: Progressing Goal: Individualized Educational Video(s) Outcome: Progressing   Problem: Activity: Goal: Ability to tolerate increased activity will improve Outcome: Progressing   Problem: Cardiac: Goal: Ability to achieve and maintain adequate cardiovascular perfusion will improve Outcome: Progressing   Problem: Health Behavior/Discharge Planning: Goal: Ability to safely manage health-related needs after discharge will improve Outcome: Progressing   Problem: Education: Goal: Knowledge of General Education information will improve Description: Including pain rating scale,  medication(s)/side effects and non-pharmacologic comfort measures Outcome: Progressing   Problem: Health Behavior/Discharge Planning: Goal: Ability to manage health-related needs will improve Outcome: Progressing   Problem: Clinical Measurements: Goal: Ability to maintain clinical measurements within normal limits will improve Outcome: Progressing Goal: Will remain free from infection Outcome: Progressing Goal: Diagnostic test results will improve Outcome: Progressing Goal: Respiratory complications will improve Outcome: Progressing Goal: Cardiovascular complication will be avoided Outcome: Progressing   Problem: Activity: Goal: Risk for activity intolerance will decrease Outcome: Progressing   Problem: Nutrition: Goal: Adequate nutrition will be maintained Outcome: Progressing   Problem: Coping: Goal: Level of anxiety will decrease Outcome: Progressing   Problem: Elimination: Goal: Will not experience complications related to bowel motility Outcome: Progressing Goal: Will not experience complications related to urinary retention Outcome: Progressing   Problem: Pain Managment: Goal: General experience of comfort will improve and/or be controlled Outcome: Progressing   Problem: Safety: Goal: Ability to remain free from injury will improve Outcome: Progressing   Problem: Skin Integrity: Goal: Risk for impaired skin integrity will decrease Outcome: Progressing   Problem: Education: Goal: Will demonstrate proper wound care and an understanding of methods to prevent future damage Outcome: Progressing Goal: Knowledge of disease or condition will improve Outcome: Progressing Goal: Knowledge of the prescribed therapeutic regimen will improve Outcome: Progressing Goal: Individualized Educational Video(s) Outcome: Progressing   Problem: Activity: Goal: Risk for activity intolerance will decrease Outcome: Progressing   Problem: Cardiac: Goal: Will achieve and/or  maintain hemodynamic stability Outcome: Progressing   Problem: Clinical Measurements: Goal: Postoperative complications will be avoided or minimized Outcome: Progressing   Problem: Respiratory: Goal: Respiratory status will improve Outcome: Progressing   Problem: Skin Integrity: Goal: Wound healing without signs and symptoms of infection Outcome: Progressing Goal: Risk for impaired skin integrity will decrease Outcome: Progressing   Problem: Urinary Elimination: Goal: Ability to achieve and maintain adequate renal perfusion and functioning will improve Outcome: Progressing   Problem: Safety: Goal: Non-violent Restraint(s) Outcome: Progressing

## 2023-11-14 NOTE — Progress Notes (Signed)
 Progress Note  Patient Name: Katelyn Cline Date of Encounter: 11/14/2023  Primary Cardiologist: Lauro Portal, MD  Subjective   No symptoms.  Doing good.  Inpatient Medications    Scheduled Meds:  acetaminophen  (TYLENOL ) oral liquid 160 mg/5 mL  650 mg Per Tube Once   apixaban   5 mg Oral BID   arformoterol   15 mcg Nebulization BID   aspirin  EC  81 mg Oral Daily   Or   aspirin   81 mg Per Tube Daily   atorvastatin   80 mg Oral QHS   bisacodyl   10 mg Oral Daily   Or   bisacodyl   10 mg Rectal Daily   buPROPion  ER  100 mg Oral BID   docusate  200 mg Oral Daily   DULoxetine   60 mg Oral BID   feeding supplement (GLUCERNA SHAKE)  237 mL Oral TID WC   furosemide   80 mg Intravenous BID   hydroxyurea   1,000 mg Oral Daily   insulin  aspart  0-9 Units Subcutaneous TID WC   lisinopril   5 mg Oral Daily   metoprolol  tartrate  12.5 mg Oral BID   pantoprazole  (PROTONIX ) IV  40 mg Intravenous Q12H   revefenacin   175 mcg Nebulization Daily   sodium chloride  flush  3 mL Intravenous Q12H   spironolactone   12.5 mg Oral Daily   Continuous Infusions:  PRN Meds: acetaminophen , hydrALAZINE , ipratropium-albuterol , metoprolol  tartrate, ondansetron  (ZOFRAN ) IV, mouth rinse, sodium chloride  flush, traMADol    Vital Signs    Vitals:   11/13/23 2332 11/14/23 0408 11/14/23 0805 11/14/23 0846  BP: 128/64 (!) 110/51 (!) 130/54   Pulse: 60 60 64 65  Resp: 17 19 20  (!) 24  Temp: 98.2 F (36.8 C) 97.8 F (36.6 C) 98.2 F (36.8 C)   TempSrc: Oral Oral Oral   SpO2: 97% 96% 96% 97%  Weight:  98.6 kg    Height:        Intake/Output Summary (Last 24 hours) at 11/14/2023 0956 Last data filed at 11/14/2023 0154 Gross per 24 hour  Intake 480 ml  Output 2550 ml  Net -2070 ml   Filed Weights   11/12/23 0457 11/13/23 0320 11/14/23 0408  Weight: 100.7 kg 100.4 kg 98.6 kg    Telemetry     Personally reviewed.  NSR.  ECG    Not performed today.  Physical Exam   GEN: No acute distress.    Neck: Unable to examine JVD due to body habitus. Cardiac: RRR, no murmur, rub, or gallop.  Respiratory: Nonlabored. Clear to auscultation bilaterally. GI: Soft, nontender, bowel sounds present. MS: 2+ pitting edema; No deformity. Neuro:  Nonfocal. Psych: Alert and oriented x 3. Normal affect.  Labs    Chemistry Recent Labs  Lab 11/09/23 0425 11/10/23 0916 11/10/23 1750 11/11/23 1005  NA 137 138 137 137  K 3.4* 4.5 3.6 3.7  CL 103 106  --  104  CO2 26 27  --  23  GLUCOSE 73 82  --  110*  BUN 19 13  --  13  CREATININE 0.59 0.63  --  0.55  CALCIUM  8.3* 8.6*  --  8.3*  GFRNONAA >60 >60  --  >60  ANIONGAP 8 5  --  10     Hematology Recent Labs  Lab 11/11/23 0352 11/12/23 0630 11/13/23 0311  WBC 28.7* 26.8* 23.5*  RBC 3.34* 3.51* 3.43*  HGB 9.7* 10.2* 9.7*  HCT 31.7* 32.6* 31.7*  MCV 94.9 92.9 92.4  MCH 29.0 29.1 28.3  MCHC 30.6 31.3 30.6  RDW 19.1* 19.2* 19.0*  PLT 936* 1,052* 1,075*    Cardiac Enzymes Recent Labs  Lab 10/25/23 0025 10/25/23 0220  TROPONINIHS 34* 45*    BNPNo results for input(s): "BNP", "PROBNP" in the last 168 hours.   DDimerNo results for input(s): "DDIMER" in the last 168 hours.   Radiology    No results found.   Assessment & Plan    Acute on chronic diastolic heart failure: Continues to be volume overloaded but significantly improved compared to yesterday, decrease IV Lasix  from 80 mg to 40 mg twice daily.  Labs today pending.  Keep K>4 and <5, Mg>2 and <3.  Made 2.7L urine output in the last 24 hours with net -2 L, on IV Lasix  80 mg twice daily. S/p L thoracentesis, 1L bloody fluid.  Restart SGLT2 inhibitors when safe after surgery, continue spironolactone  12.5 mg once daily.  CAD s/p three-vessel CABG, bioprosthetic AVR and left atrial appendage clip on 11/03/2023: CT surgery primary team.  Echocardiogram showed elevated mean AV gradient, 17 mmHg.  Continue cardioprotective medications, aspirin  81 mg once daily, atorvastatin  80 mg  nightly.  Paroxysmal A-fib: NSR on telemetry.  Continue metoprolol  tartrate 12.5 mg twice daily and Eliquis  5 mg twice daily.  Underwent left atrial appendage clip placement at the time of CABG on 11/03/2023.  Discussion to come off of Eliquis  per outpatient cardiology.  HTN, controlled: Continue above medications.  Signed, Lasalle Pointer, MD  11/14/2023, 9:56 AM

## 2023-11-14 NOTE — Progress Notes (Signed)
   11/14/23 0935  Mobility  Activity Ambulated with assistance in hallway  Level of Assistance Minimal assist, patient does 75% or more  Assistive Device Front wheel walker  Distance Ambulated (ft) 150 ft  RUE Weight Bearing Per Provider Order NWB  LUE Weight Bearing Per Provider Order NWB  Activity Response Tolerated fair  Mobility Referral Yes  Mobility visit 1 Mobility  Mobility Specialist Start Time (ACUTE ONLY) 0935  Mobility Specialist Stop Time (ACUTE ONLY) 1005  Mobility Specialist Time Calculation (min) (ACUTE ONLY) 30 min   Mobility Specialist: Progress Note  Pre-Mobility:      HR 65, SpO2 90% 1.5L Post-Mobility:    HR 63, SpO2 97% 1.5L  Pt agreeable to mobility session - received in bed. C/o chest pain rated 6/10 and neck/shoulder pain rated 8/10. Returned to EOB with all needs met - call bell within reach.   Katelyn Cline, BS Mobility Specialist Please contact via SecureChat or  Rehab office at [phone].

## 2023-11-14 NOTE — Progress Notes (Signed)
 Called to Patient's room, she was in bedside commode having bowel movement, got dizzy, sweaty and felt like she was going to pass out, very nauseated. Helped pt back to bed, BP 100/48, blood sugar was 121. Recheck BP was 93/50, 98/54. PRN Zofran  administered. Pt felt better after getting back in bed. Current BP 108/50, HR 63. Call bell within reach, plan of care continues.

## 2023-11-15 ENCOUNTER — Other Ambulatory Visit (HOSPITAL_COMMUNITY): Payer: Self-pay

## 2023-11-15 ENCOUNTER — Telehealth (HOSPITAL_COMMUNITY): Payer: Self-pay | Admitting: Pharmacy Technician

## 2023-11-15 ENCOUNTER — Encounter: Payer: Self-pay | Admitting: Hematology

## 2023-11-15 DIAGNOSIS — I200 Unstable angina: Secondary | ICD-10-CM | POA: Diagnosis not present

## 2023-11-15 LAB — GLUCOSE, CAPILLARY
Glucose-Capillary: 106 mg/dL — ABNORMAL HIGH (ref 70–99)
Glucose-Capillary: 195 mg/dL — ABNORMAL HIGH (ref 70–99)
Glucose-Capillary: 102 mg/dL — ABNORMAL HIGH (ref 70–99)

## 2023-11-15 LAB — CBC
HCT: 33.3 % — ABNORMAL LOW (ref 36.0–46.0)
Hemoglobin: 10 g/dL — ABNORMAL LOW (ref 12.0–15.0)
MCH: 28.1 pg (ref 26.0–34.0)
MCHC: 30 g/dL (ref 30.0–36.0)
MCV: 93.5 fL (ref 80.0–100.0)
Platelets: 1198 10*3/uL (ref 150–400)
RBC: 3.56 MIL/uL — ABNORMAL LOW (ref 3.87–5.11)
RDW: 18.6 % — ABNORMAL HIGH (ref 11.5–15.5)
WBC: 18 10*3/uL — ABNORMAL HIGH (ref 4.0–10.5)
nRBC: 0.6 % — ABNORMAL HIGH (ref 0.0–0.2)

## 2023-11-15 LAB — BASIC METABOLIC PANEL WITH GFR
Anion gap: 10 (ref 5–15)
BUN: 11 mg/dL (ref 8–23)
CO2: 26 mmol/L (ref 22–32)
Calcium: 8.9 mg/dL (ref 8.9–10.3)
Chloride: 101 mmol/L (ref 98–111)
Creatinine, Ser: 0.61 mg/dL (ref 0.44–1.00)
GFR, Estimated: >60 mL/min (ref >60)
Glucose, Bld: 91 mg/dL (ref 70–99)
Potassium: 3.7 mmol/L (ref 3.5–5.1)
Sodium: 137 mmol/L (ref 135–145)

## 2023-11-15 MED ORDER — FENOFIBRATE 160 MG PO TABS
160.0000 mg | ORAL_TABLET | Freq: Every day | ORAL | Status: DC
Start: 2023-11-15 — End: 2023-11-25
  Administered 2023-11-15 – 2023-11-25 (×11): 160 mg via ORAL
  Filled 2023-11-15 (×11): qty 1

## 2023-11-15 MED ORDER — FUROSEMIDE 10 MG/ML IJ SOLN
40.0000 mg | Freq: Two times a day (BID) | INTRAMUSCULAR | Status: AC
  Administered 2023-11-15: 40 mg via INTRAVENOUS
  Filled 2023-11-15: qty 4

## 2023-11-15 MED ORDER — HYDROXYUREA 500 MG PO CAPS
500.0000 mg | ORAL_CAPSULE | ORAL | Status: DC
  Administered 2023-11-15 – 2023-11-24 (×5): 500 mg via ORAL
  Filled 2023-11-15 (×5): qty 1

## 2023-11-15 MED ORDER — FUROSEMIDE 40 MG PO TABS
40.0000 mg | ORAL_TABLET | Freq: Every day | ORAL | Status: DC
  Administered 2023-11-16 – 2023-11-25 (×10): 40 mg via ORAL
  Filled 2023-11-15 (×10): qty 1

## 2023-11-15 MED ORDER — OMEGA-3-ACID ETHYL ESTERS 1 G PO CAPS
1.0000 g | ORAL_CAPSULE | Freq: Two times a day (BID) | ORAL | Status: DC
  Administered 2023-11-15 – 2023-11-25 (×21): 1 g via ORAL
  Filled 2023-11-15 (×21): qty 1

## 2023-11-15 MED ORDER — PANTOPRAZOLE SODIUM 40 MG PO TBEC
40.0000 mg | DELAYED_RELEASE_TABLET | Freq: Two times a day (BID) | ORAL | Status: DC
  Administered 2023-11-15 – 2023-11-25 (×20): 40 mg via ORAL
  Filled 2023-11-15 (×19): qty 1

## 2023-11-15 MED ORDER — EMPAGLIFLOZIN 10 MG PO TABS
10.0000 mg | ORAL_TABLET | Freq: Every day | ORAL | Status: DC
  Administered 2023-11-15 – 2023-11-25 (×11): 10 mg via ORAL
  Filled 2023-11-15 (×11): qty 1

## 2023-11-15 MED ORDER — POTASSIUM CHLORIDE CRYS ER 20 MEQ PO TBCR
30.0000 meq | EXTENDED_RELEASE_TABLET | Freq: Two times a day (BID) | ORAL | Status: AC
  Administered 2023-11-15 (×2): 30 meq via ORAL
  Filled 2023-11-15 (×2): qty 1

## 2023-11-15 MED ORDER — EZETIMIBE 10 MG PO TABS
10.0000 mg | ORAL_TABLET | Freq: Every day | ORAL | Status: DC
  Administered 2023-11-15 – 2023-11-25 (×11): 10 mg via ORAL
  Filled 2023-11-15 (×11): qty 1

## 2023-11-15 NOTE — Progress Notes (Signed)
 Inpatient Rehab Admissions Coordinator:   Received a denial from [facility] regarding CIR prior auth request.  I spoke to son on the phone and they would like to appeal this decision, but are open to SNF backup.  I will stop by her room this afternoon and get the necessary documentation signed and fax to [facility] for an appeal.    Loye Rumble, PT, DPT Admissions Coordinator [phone] 11/15/23  10:59 AM

## 2023-11-15 NOTE — Progress Notes (Addendum)
 [address]       [place] [address]             [phone]        12 Days Post-Op Procedure(s) (LRB): EXPLORATION POST OPERATIVE OPEN HEART; WASHOUT (N/A) ECHOCARDIOGRAM, TRANSESOPHAGEAL (N/A)  Subjective: Patient sleeping and briefly awakened this am. She states "I am peeing a lot" and my breathing is "good"  Objective: Vital signs in last 24 hours: Temp:  [97.8 F (36.6 C)-98.3 F (36.8 C)] 98.3 F (36.8 C) (05/19 0509) Pulse Rate:  [60-67] 63 (05/19 0509) Cardiac Rhythm: Normal sinus rhythm (05/18 1921) Resp:  [15-24] 20 (05/19 0509) BP: (93-159)/(48-63) 124/57 (05/19 0509) SpO2:  [90 %-98 %] 90 % (05/19 0509) Weight:  [97.4 kg] 97.4 kg (05/19 0509)  Pre op weight 98.5 kg Current Weight  11/15/23 97.4 kg      Intake/Output from previous day: 05/18 0701 - 05/19 0700 In: 240 [P.O.:240] Out: -    Physical Exam: Cardiovascular: RRR, soft flow murmur Pulmonary: Slightly diminished left base and right lung is clear Abdomen: Soft, obese, non tender, bowel sounds present. Extremities: Bilateral ankle/feet edema continues to improve Wounds: Clean and dry.  No erythema or signs of infection.  Lab Results: CBC: Recent Labs    11/13/23 0311 11/15/23 0348  WBC 23.5* 18.0*  HGB 9.7* 10.0*  HCT 31.7* 33.3*  PLT 1,075* 1,198*   BMET:  Recent Labs    11/15/23 0348  NA 137  K 3.7  CL 101  CO2 26  GLUCOSE 91  BUN 11  CREATININE 0.61  CALCIUM  8.9    PT/INR:  Lab Results  Component Value Date   INR 2.1 (H) 11/03/2023   INR 1.5 (H) 11/03/2023   INR 1.1 10/28/2023   ABG:  INR: Will add last result for INR, ABG once components are confirmed Will add last 4 CBG results once components are confirmed  Assessment/Plan:  1. CV - History of a paroxysmal atrial fibrillation. Maintaining SR. On Lisinopril  5 mg daily, Lopressor  12.5 mg bid, and Apixaban  5 mg bid.  2.  Pulmonary - History of COPD, OSA. Continue Brovana , Duoneb, and  Yupelri . On 2 liters of oxygen  via [place]. She was on oxygen  at home prior to admission. S/p left thoracentesis 05/14 with 1L of bloody fluid removed.  Encourage incentive spirometer.  3. Acute on chronic diastolic heart failure-On Spironolactone  12.5 mg daily. Per cardiology, Lasix  80 mg IV bid given yesterday with very good diuresis. Lasix  40 mg IV bid ordered for today. 4.  Expected post op acute blood loss anemia - H and H this am stable at 10 and 33.3 5. DM-CBGs 111/144/106. Scheduled Insulin  previously stopped to avoid further hypoglycemia. Sliding scale changed from resistant to sensitive. Pre op HGA1C 5.1. She was on Metformin  1000 mg bid prior to admission and will restart soon. Will try Glucerna to help with appetite/oral intake 6. Leukocytosis and thrombocytosis-She has a history of polycythemia vera. WBC this am 18,000 and platelets 1,198,000.  As previously discussed with heme/onc, Hydrea  restarted at 1000 mg daily. Ferritin is 154,iron  is 25, and TIBC 291. She has lab checks and follow up with heme/oncology post op 7. Deconditioned-continue with CR.  8. Supplement potassium 9. Disposition-Hopefully, insurance will approve CIR; May discharge once bed CIR available.  I discussed with the patient that if insurance does not approve CIR, would she be willing to go to SNF and she said no.  Donielle M ZimmermanPA-C  6:58 AM  Agree Dispo planning  Hilarie Lovely

## 2023-11-15 NOTE — Telephone Encounter (Signed)
 Patient Product/process development scientist completed.    The patient is insured through [facility]. Patient has Medicare and is not eligible for a copay card, but may be able to apply for patient assistance or Medicare RX Payment Plan (Patient Must reach out to their plan, if eligible for payment plan), if available.    Ran test claim for Jardiance 10 mg and the current 30 day co-pay is $47.00.  Ran test claim for Farxiga 10 mg and the current 30 day co-pay is $253.82.  This test claim was processed through [facility]- copay amounts may vary at other pharmacies due to pharmacy/plan contracts, or as the patient moves through the different stages of their insurance plan.     Roland Earl, CPHT Pharmacy Technician III Certified Patient Advocate [facility] Patient Advocate Team Direct Number: [phone]  Fax: [phone]

## 2023-11-15 NOTE — Progress Notes (Signed)
 CARDIAC REHAB PHASE I   Pt resting in bed, feeling well today. Postop OHS education completed. Referral for CRP2 sent to [facility]. PT/OT and mobility team assisting with mobility needs. Pt awaiting CIR/SNF placement. CRP1 will sign off today.   [id number] Ronny Colas, RN BSN 11/15/2023 11:14 AM

## 2023-11-15 NOTE — Progress Notes (Signed)
 Mobility Specialist Progress Note:   11/15/23 1142  Mobility  Activity Stood at bedside;Transferred from bed to chair  Level of Assistance Moderate assist, patient does 50-74%  Assistive Device Front wheel walker  Distance Ambulated (ft) 4 ft  RUE Weight Bearing Per Provider Order NWB  LUE Weight Bearing Per Provider Order NWB  Activity Response Tolerated well  Mobility Referral Yes  Mobility visit 1 Mobility  Mobility Specialist Start Time (ACUTE ONLY) 1142  Mobility Specialist Stop Time (ACUTE ONLY) 1152  Mobility Specialist Time Calculation (min) (ACUTE ONLY) 10 min   Pt received in bed, agreeable to mobility session. ModA required to stand at bedside, pt c/o neck and back pain upon standing. Deferred further ambulation and transferred pt to chair. Sitting up comfortably with all needs met, call bell in reach.    Tova Vater Mobility Specialist Please contact via Special educational needs teacher or  Rehab office at [phone]

## 2023-11-15 NOTE — Care Management Important Message (Signed)
 Important Message  Patient Details  Name: Katelyn Cline MRN: [id number] Date of Birth: [birth year]   Important Message Given:  Yes - Medicare IM     Felix Host 11/15/2023, 3:02 PM

## 2023-11-15 NOTE — Plan of Care (Signed)

## 2023-11-15 NOTE — Progress Notes (Signed)
 PT Cancellation Note  Patient Details Name: Katelyn Cline MRN: [id number] DOB: January 04, 1961   Cancelled Treatment:    Reason Eval/Treat Not Completed: Other (comment) Pt declined participation with PT on this date, noting fatigue. PT spent extra time with pt attempting to discussing rehab plans, purpose & importance of OOB mobility. Attempted to wean pt to room air with SPO2 dropping to 91% so pt placed back on supplemental O2. Pt continued to refused even bed level exercises. Will f/u as able.  Emaline Handsome, PT, DPT 11/15/23, 2:10 PM    [name] 11/15/2023, 2:09 PM

## 2023-11-15 NOTE — Progress Notes (Addendum)
 Patient Name: Katelyn Cline Date of Encounter: 11/15/2023 [facility] Cardiologist: Lauro Portal, MD   Interval Summary  .    Complains of neck pain, has been slow to rehab. Pending possible CIR placement.   Vital Signs .    Vitals:   11/14/23 2326 11/15/23 0509 11/15/23 0741 11/15/23 0814  BP: (!) 133/59 (!) 124/57 127/62   Pulse: 60 63 63 (!) 59  Resp: 15 20 20 18   Temp: 98 F (36.7 C) 98.3 F (36.8 C) 98.2 F (36.8 C)   TempSrc: Oral Oral Oral   SpO2: 92% 90% 96% 96%  Weight:  97.4 kg    Height:        Intake/Output Summary (Last 24 hours) at 11/15/2023 0929 Last data filed at 11/15/2023 0856 Gross per 24 hour  Intake 480 ml  Output --  Net 480 ml      11/15/2023    5:09 AM 11/14/2023    4:08 AM 11/13/2023    3:20 AM  Last 3 Weights  Weight (lbs) 214 lb 12.8 oz 217 lb 6 oz 221 lb 5.5 oz  Weight (kg) 97.433 kg 98.6 kg 100.4 kg      Telemetry/ECG    Sinus Rhythm - Personally Reviewed  Physical Exam .    GEN: No acute distress.   Neck: No JVD Cardiac: RRR, no murmurs, rubs, or gallops.  Respiratory: Clear to auscultation bilaterally. GI: Soft, nontender, non-distended  MS: No edema  Assessment & Plan .     63 yo female with PMH of CAD noted on cardiac catheterization on 10/01/2023, paroxysmal atrial fibrillation on Eliquis , PAD, COPD, hypertension, hyperlipidemia, type 2 diabetes mellitus, and significant tobacco abuse. Patient underwent an outpatient cardiac catheterization earlier this month which showed severe multivessel. Patient was referred to CT surgery as an outpatient and was scheduled to see Dr. Deloise Ferries on 10/29/2023. However, she presented to the ED on 10/25/2023 with chest pain and was admitted for unstable angina. Now s/p CABG pending placement for DC  CAD  Aortic stenosis -- s/p CABG x3 with bioprosthetic AVR and LAA clip 11/03/23   -- Continue ASA 81 mg daily, lipitor 80 mg daily, metoprolol  tartrate 25 mg BID  Acute on chronic  HFpEF Moderate left pleural effusion  -- Echo 11/08/2023 showed EF 60-65%, no regional wall motion abnormalities, mild LVH, grade 2 DD.  There was DD shaped septum suggestive of RV pressure/volume overload, mildly reduced RV systolic function -- s/p L thoracentesis - 1L bloody fluid  -- on IV lasix  40mg  BID (reduced from 80mg  BID yesterday), net - 6.2L and weight is down to 214lbs. Will transition to lasix  40mg  daily PO starting tomorrow -- continue spiro 12.5mg  daily, metoprolol  12.5mg  BID, start jardiance     PAF -- remains in SR -- continue eliquis  5 mg BID, metoprolol  tartrate 12.5 mg BID    HTN  -- well controlled -- continue metoprolol  12.5mg  BID, spiro   HLD -- continue atorvastatin  80mg  daily, resume Zetia  and Tricor  (PTA meds)  Chronic hypoxic respiratory failure COPD Tobacco use -- on [place] @2 -3L PTA   For questions or updates, please contact [facility] Please consult [email or web address] for contact info under        Signed, Johnie Nailer, NP   Patient seen, examined. Available data reviewed. Agree with findings, assessment, and plan as outlined by Johnie Nailer, NP. Pt is independently interviewed and examined. Son at bedside. On my exam: Vitals:   11/15/23 0741 11/15/23 1610  BP: 127/62   Pulse: 63 (!) 59  Resp: 20 18  Temp: 98.2 F (36.8 C)   SpO2: 96% 96%   Pt is alert and oriented, NAD HEENT: normal Neck: JVP - normal Lungs: CTA bilaterally CV: RRR with 2/6 systolic murmur at the RUSB Abd: soft, NT, Positive BS, no hepatomegaly Ext: 1+ right pretibial edema, distal pulses intact and equal Skin: warm/dry no rash  Pt most limited by chronic pain, slow to progress with her physical limitations. Stable from CV perspective. I agree that Inpatient Rehab would help her if she is approved. Medications reviewed and include IV--->PO lasix , lisinopril  in the setting of CAD and DM, metoprolol , atorvastatin  80 mg, and apixaban  for oral anticoagulation. Pt  well-compensated. Will arrange cardiac follow-up as outpatient. Otherwise, as outlined above.    Arnoldo Lapping, M.D. 11/15/2023 10:48 AM

## 2023-11-16 LAB — GLUCOSE, CAPILLARY
Glucose-Capillary: 117 mg/dL — ABNORMAL HIGH (ref 70–99)
Glucose-Capillary: 98 mg/dL (ref 70–99)
Glucose-Capillary: 78 mg/dL (ref 70–99)
Glucose-Capillary: 134 mg/dL — ABNORMAL HIGH (ref 70–99)
Glucose-Capillary: 133 mg/dL — ABNORMAL HIGH (ref 70–99)

## 2023-11-16 NOTE — Plan of Care (Signed)
  Problem: Education: Goal: Ability to describe self-care measures that may prevent or decrease complications (Diabetes Survival Skills Education) will improve Outcome: Progressing   Problem: Coping: Goal: Ability to adjust to condition or change in health will improve Outcome: Progressing   Problem: Nutritional: Goal: Maintenance of adequate nutrition will improve Outcome: Progressing   Problem: Activity: Goal: Ability to tolerate increased activity will improve Outcome: Progressing

## 2023-11-16 NOTE — Progress Notes (Signed)
 Mobility Specialist Progress Note:   11/16/23 0942  Mobility  Activity Transferred from bed to chair;Ambulated with assistance in room  Level of Assistance Minimal assist, patient does 75% or more  Assistive Device Front wheel walker  Distance Ambulated (ft) 24 ft  RUE Weight Bearing Per Provider Order NWB  LUE Weight Bearing Per Provider Order NWB  Activity Response Tolerated well  Mobility Referral Yes  Mobility visit 1 Mobility  Mobility Specialist Start Time (ACUTE ONLY) [id number]  Mobility Specialist Stop Time (ACUTE ONLY) 0956  Mobility Specialist Time Calculation (min) (ACUTE ONLY) 14 min   Pt received in bed, agreeable to mobility session. MinA required to stand at bedside with RW, extended time required to sit EOB and stand. MinG during ambulation. At end of session, pt sitting comfortably in chair, call bell in reach and all needs met.    Greyson Riccardi Mobility Specialist Please contact via Special educational needs teacher or  Rehab office at [phone]

## 2023-11-16 NOTE — Progress Notes (Addendum)
 [address]       [place] [address]             [phone]        13 Days Post-Op Procedure(s) (LRB): EXPLORATION POST OPERATIVE OPEN HEART; WASHOUT (N/A) ECHOCARDIOGRAM, TRANSESOPHAGEAL (N/A)  Subjective: Patient with neck pain this am.  Objective: Vital signs in last 24 hours: Temp:  [97.7 F (36.5 C)-98.7 F (37.1 C)] 98.5 F (36.9 C) (05/20 0315) Pulse Rate:  [58-70] 58 (05/20 0315) Cardiac Rhythm: Sinus bradycardia (05/20 0340) Resp:  [15-22] 20 (05/20 0315) BP: (102-136)/(44-62) 134/54 (05/20 0315) SpO2:  [93 %-100 %] 93 % (05/20 0315) FiO2 (%):  [28 %] 28 % (05/19 1953) Weight:  [96.8 kg] 96.8 kg (05/20 0315)  Pre op weight 98.5 kg Current Weight  11/16/23 96.8 kg      Intake/Output from previous day: 05/19 0701 - 05/20 0700 In: 1590 [P.O.:1590] Out: 3050 [Urine:3050]   Physical Exam: Cardiovascular: RRR, soft flow murmur Pulmonary: Slightly diminished left base and right lung is clear Abdomen: Soft, obese, non tender, bowel sounds present. Extremities: Bilateral ankle/feet edema continues much improved Wounds: Clean and dry.  No erythema or signs of infection.  Lab Results: CBC: Recent Labs    11/15/23 0348  WBC 18.0*  HGB 10.0*  HCT 33.3*  PLT 1,198*   BMET:  Recent Labs    11/15/23 0348  NA 137  K 3.7  CL 101  CO2 26  GLUCOSE 91  BUN 11  CREATININE 0.61  CALCIUM  8.9    PT/INR:  Lab Results  Component Value Date   INR 2.1 (H) 11/03/2023   INR 1.5 (H) 11/03/2023   INR 1.1 10/28/2023   ABG:  INR: Will add last result for INR, ABG once components are confirmed Will add last 4 CBG results once components are confirmed  Assessment/Plan:  1. CV - History of a paroxysmal atrial fibrillation. Maintaining SR, SB at times. On Lisinopril  5 mg daily, Lopressor  12.5 mg bid, and Apixaban  5 mg bid.  2.  Pulmonary - History of COPD, OSA. Continue Brovana , Duoneb, and Yupelri . On 2 liters of oxygen  via [place]. She  was on oxygen  at home prior to admission. S/p left thoracentesis 05/14 with 1L of bloody fluid removed.  Encourage incentive spirometer.  3. Acute on chronic diastolic heart failure-On Jardiance  10 mg daily and Spironolactone  12.5 mg daily. Per cardiology, Lasix  80 mg IV bid given over the weekend with very good diuresis. Lasix  40 mg IV bid ordered for yesterday with transition to Lasix  40 mg orally today (per cardiology note yesterday). 4.  Expected post op acute blood loss anemia - Last H and H stable at 10 and 33.3 5. DM-CBGs 195/102/98. Scheduled Insulin  previously stopped to avoid further hypoglycemia. Sliding scale changed from resistant to sensitive. Pre op HGA1C 5.1. She was on Metformin  1000 mg bid prior to admission and will restart soon. Will try Glucerna to help with appetite/oral intake 6. Leukocytosis and thrombocytosis-She has a history of polycythemia vera. WBC yesterday was 18,000 and platelets 1,198,000.  As recently discussed with Dr. Salomon Cree via secure chat, additional Hydrea  500 mg every other day. Ferritin is 154,iron  is 25, and TIBC 291. She has lab checks and follow up with heme/oncology post op 7. K pad for neck pain 8. Deconditioned-continue with CR.  9. Disposition-Insurance did not approve CIR. Appeal in process. Likely, will not be approved for CIR. Hopefully, she will agree to go go  SNF  Donielle M ZimmermanPA-C 7:13 AM   Agree  Dispo planning  Nykole Matos [name]

## 2023-11-16 NOTE — Progress Notes (Addendum)
 Physical Therapy Treatment Patient Details Name: Katelyn Cline MRN: [id number] DOB: 1960/08/09 Today's Date: 11/16/2023   History of Present Illness 63 y.o. female presented 10/25/23 for the evaluation of unstable angina. 5/7 CABG x3, AVR; extubated 5/11; BiPAP 5/11  PMH- 3v CAD, atrial fibrillation, COPD oxygen  dependent 4L, 100 pack year smoking history, diabetes, HTN, HLD, PAD, moderate aortic stenosis, agoraphobia, fibromyalgia, narcolepsy, peripheral neuropathy    PT Comments  Pt up on [facility] on arrival and agreeable to session. Pt able to verbally recall sternal precautions prior to mobility however pt with poor carryover during functional mobility for implementation of precautions needing max cues for adherence. Pt able to boost to stand with min-mod A with continued cues for use of momentum on rise. Pt able to perform short gait in room with RW for support with seated rest, pt declining further gait trials despite max encouragement. Pt agreeable to time up in chair at end of session. Patient will benefit from intensive inpatient follow-up therapy, >3 hours/day and continues to benefit from skilled PT services to progress toward functional mobility goals.     If plan is discharge home, recommend the following: Assistance with cooking/housework;Assist for transportation;Help with stairs or ramp for entrance   Can travel by private vehicle        Equipment Recommendations  Rolling walker (2 wheels)    Recommendations for Other Services       Precautions / Restrictions Precautions Precautions: Sternal;Fall Precaution Booklet Issued: Yes (comment) Recall of Precautions/Restrictions: Impaired Precaution/Restrictions Comments: frequent cues to adhere to sternal precautions Restrictions Weight Bearing Restrictions Per Provider Order: Yes     Mobility  Bed Mobility Overal bed mobility: Needs Assistance             General bed mobility comments: OOB in recliner     Transfers Overall transfer level: Needs assistance Equipment used: Rolling walker (2 wheels) Transfers: Sit to/from Stand Sit to Stand: Min assist, Mod assist           General transfer comment: mod assist from recliner with cues for no UE use and education on use of momentum to stand, min A from [facility]    Ambulation/Gait Ambulation/Gait assistance: Min assist Gait Distance (Feet): 5 Feet Assistive device: Rolling walker (2 wheels) Gait Pattern/deviations: Step-through pattern, Decreased stride length Gait velocity: decr     General Gait Details: short shuffling steps in room from [facility] to chair, pt requesting seated rest, declining further gai despite max encouragement   Stairs             Wheelchair Mobility     Tilt Bed    Modified Rankin (Stroke Patients Only)       Balance Overall balance assessment: Needs assistance Sitting-balance support: No upper extremity supported, Feet unsupported Sitting balance-Leahy Scale: Good     Standing balance support: Single extremity supported, No upper extremity supported, During functional activity Standing balance-Leahy Scale: Poor Standing balance comment: able to stand with single UE support to pull up brief                            Communication Communication Communication: No apparent difficulties  Cognition Arousal: Alert Behavior During Therapy: WFL for tasks assessed/performed                           PT - Cognition Comments: very poor adherence to sternal precautions needing repeated cues, despite being  able to recall prior to functional mobility Following commands: Intact      Cueing Cueing Techniques: Verbal cues  Exercises      General Comments General comments (skin integrity, edema, etc.): VSS on 3L      Pertinent Vitals/Pain Pain Assessment Pain Assessment: Faces Faces Pain Scale: Hurts a little bit Pain Location: chest incision, back, neck Pain Descriptors /  Indicators: Sore Pain Intervention(s): Monitored during session, Limited activity within patient's tolerance    Home Living                          Prior Function            PT Goals (current goals can now be found in the care plan section) Acute Rehab PT Goals PT Goal Formulation: With patient Time For Goal Achievement: 11/22/23 Progress towards PT goals: Progressing toward goals    Frequency    Min 2X/week      PT Plan      Co-evaluation              AM-PAC PT "6 Clicks" Mobility   Outcome Measure  Help needed turning from your back to your side while in a flat bed without using bedrails?: A Little Help needed moving from lying on your back to sitting on the side of a flat bed without using bedrails?: A Little Help needed moving to and from a bed to a chair (including a wheelchair)?: A Lot Help needed standing up from a chair using your arms (e.g., wheelchair or bedside chair)?: A Lot Help needed to walk in hospital room?: Total Help needed climbing 3-5 steps with a railing? : Total 6 Click Score: 12    End of Session Equipment Utilized During Treatment: Gait belt;Oxygen  Activity Tolerance: Patient tolerated treatment well Patient left: in chair;with call bell/phone within reach Nurse Communication: Mobility status PT Visit Diagnosis: Difficulty in walking, not elsewhere classified (R26.2)     Time: 1610-9604 PT Time Calculation (min) (ACUTE ONLY): 24 min  Charges:    $Therapeutic Activity: 23-37 mins PT General Charges $$ ACUTE PT VISIT: 1 Visit                     Antony Baumgartner R. PTA Acute Rehabilitation Services Office: [phone]   Agapito Horseman 11/16/2023, 4:10 PM

## 2023-11-16 NOTE — Progress Notes (Signed)
 Occupational Therapy Treatment Patient Details Name: Katelyn Cline MRN: [id number] DOB: [birth year] Today's Date: 11/16/2023   History of present illness 63 y.o. female presented 10/25/23 for the evaluation of unstable angina. 5/7 CABG x3, AVR; extubated 5/11; BiPAP 5/11  PMH- 3v CAD, atrial fibrillation, COPD oxygen  dependent 4L, 100 pack year smoking history, diabetes, HTN, HLD, PAD, moderate aortic stenosis, agoraphobia, fibromyalgia, narcolepsy, peripheral neuropathy   OT comments  Patient received seated in recliner and agreeable to OT session. Patient unable to recall sternal precautions without cues and required frequent cues for no UE use for sit to stands with patient requiring min to mod assist for sit to stand with education on use of momentum to stand. Patient able to perform grooming tasks standing at sink with CGA to supervision with patient requiring seated rest break following. Patient will benefit from intensive inpatient follow-up therapy, >3 hours/day to increase independence and safety with self care and transfers. Acute OT to continue to follow to address established goals to facilitate DC to next venue of care.       If plan is discharge home, recommend the following:  A lot of help with bathing/dressing/bathroom;Assistance with cooking/housework;Assist for transportation;Help with stairs or ramp for entrance;A little help with walking and/or transfers;Direct supervision/assist for medications management;Direct supervision/assist for financial management   Equipment Recommendations  BSC/3in1    Recommendations for Other Services      Precautions / Restrictions Precautions Precautions: Sternal;Fall Precaution Booklet Issued: Yes (comment) Recall of Precautions/Restrictions: Impaired Precaution/Restrictions Comments: frequent cues to adhere to sternal precautions Restrictions Weight Bearing Restrictions Per Provider Order: Yes       Mobility Bed Mobility Overal  bed mobility: Needs Assistance             General bed mobility comments: OOB in recliner    Transfers Overall transfer level: Needs assistance Equipment used: Rolling walker (2 wheels) Transfers: Sit to/from Stand Sit to Stand: Min assist, Mod assist           General transfer comment: mod assist from recliner with cues for no UE use and education on use of momentum to stand     Balance Overall balance assessment: Needs assistance Sitting-balance support: No upper extremity supported, Feet unsupported Sitting balance-Leahy Scale: Good     Standing balance support: Single extremity supported, No upper extremity supported, During functional activity Standing balance-Leahy Scale: Poor Standing balance comment: able to stand at sink with one extremity support for self care tasks, limited standing with no UE support                           ADL either performed or assessed with clinical judgement   ADL Overall ADL's : Needs assistance/impaired     Grooming: Wash/dry hands;Wash/dry face;Brushing hair;Contact guard assist;Standing Grooming Details (indicate cue type and reason): at sink with seated rest break following                 Toilet Transfer: Minimal assistance;Stand-pivot;Cueing for sequencing;Cueing for safety Toilet Transfer Details (indicate cue type and reason): simulated with cues to not use BUEs and assistance for sit to stand         Functional mobility during ADLs: Minimal assistance;Contact guard assist;Cueing for safety General ADL Comments: frequent cues for sternal precautions and safety    Extremity/Trunk Assessment Upper Extremity Assessment Upper Extremity Assessment: Generalized weakness            Vision  Perception     Praxis     Communication Communication Communication: No apparent difficulties   Cognition Arousal: Alert Behavior During Therapy: WFL for tasks assessed/performed                OT - Cognition Comments: able to follow directions but continues to have difficulty recalling sternal precautions                 Following commands: Intact        Cueing   Cueing Techniques: Verbal cues  Exercises      Shoulder Instructions       General Comments SpO2 95% on 3 liters    Pertinent Vitals/ Pain       Pain Assessment Pain Assessment: Faces Faces Pain Scale: Hurts a little bit Pain Location: chest incision, back, neck Pain Descriptors / Indicators: Sore Pain Intervention(s): Limited activity within patient's tolerance, Monitored during session, Repositioned  Home Living                                          Prior Functioning/Environment              Frequency  Min 2X/week        Progress Toward Goals  OT Goals(current goals can now be found in the care plan section)  Progress towards OT goals: Progressing toward goals  Acute Rehab OT Goals OT Goal Formulation: With patient Time For Goal Achievement: 11/22/23 Potential to Achieve Goals: Good ADL Goals Pt Will Perform Grooming: with modified independence;standing Pt Will Perform Upper Body Dressing: with modified independence;sitting Pt Will Perform Lower Body Dressing: with modified independence;sit to/from stand Pt Will Transfer to Toilet: with modified independence;ambulating;bedside commode (over toilet) Pt Will Perform Toileting - Clothing Manipulation and hygiene: with modified independence;sit to/from stand Additional ADL Goal #1: Pt will generalize sternal precautions during mobility and ADLs.  Plan      Co-evaluation                 AM-PAC OT "6 Clicks" Daily Activity     Outcome Measure   Help from another person eating meals?: A Little Help from another person taking care of personal grooming?: A Little Help from another person toileting, which includes using toliet, bedpan, or urinal?: A Lot Help from another person bathing (including  washing, rinsing, drying)?: A Lot Help from another person to put on and taking off regular upper body clothing?: A Little Help from another person to put on and taking off regular lower body clothing?: A Lot 6 Click Score: 15    End of Session Equipment Utilized During Treatment: Rolling walker (2 wheels);Oxygen   OT Visit Diagnosis: Unsteadiness on feet (R26.81);Other abnormalities of gait and mobility (R26.89);Muscle weakness (generalized) (M62.81);Other symptoms and signs involving cognitive function   Activity Tolerance Patient tolerated treatment well   Patient Left in chair;with call bell/phone within reach   Nurse Communication Mobility status        Time: 1257-1315 OT Time Calculation (min): 18 min  Charges: OT General Charges $OT Visit: 1 Visit OT Treatments $Self Care/Home Management : 8-22 mins  Anitra Barn, OTA Acute Rehabilitation Services  Office [phone]   Jovita Nipper 11/16/2023, 1:28 PM

## 2023-11-16 NOTE — Progress Notes (Signed)
 Inpatient Rehab Admissions Coordinator:   Faxed appeal to [facility] [employer] at [phone] at 4:22 PM today.  Expect decision within 72 hours of their receipt.    Loye Rumble, PT, DPT Admissions Coordinator [phone] 11/16/23  4:23 PM

## 2023-11-17 LAB — GLUCOSE, CAPILLARY
Glucose-Capillary: 116 mg/dL — ABNORMAL HIGH (ref 70–99)
Glucose-Capillary: 93 mg/dL (ref 70–99)
Glucose-Capillary: 94 mg/dL (ref 70–99)
Glucose-Capillary: 124 mg/dL — ABNORMAL HIGH (ref 70–99)

## 2023-11-17 LAB — BASIC METABOLIC PANEL WITH GFR
Anion gap: 9 (ref 5–15)
BUN: 10 mg/dL (ref 8–23)
CO2: 29 mmol/L (ref 22–32)
Calcium: 8.5 mg/dL — ABNORMAL LOW (ref 8.9–10.3)
Chloride: 100 mmol/L (ref 98–111)
Creatinine, Ser: 0.63 mg/dL (ref 0.44–1.00)
GFR, Estimated: >60 mL/min (ref >60)
Glucose, Bld: 96 mg/dL (ref 70–99)
Potassium: 3.8 mmol/L (ref 3.5–5.1)
Sodium: 138 mmol/L (ref 135–145)

## 2023-11-17 LAB — CBC
HCT: 33.8 % — ABNORMAL LOW (ref 36.0–46.0)
Hemoglobin: 10.2 g/dL — ABNORMAL LOW (ref 12.0–15.0)
MCH: 28.3 pg (ref 26.0–34.0)
MCHC: 30.2 g/dL (ref 30.0–36.0)
MCV: 93.6 fL (ref 80.0–100.0)
Platelets: 1221 10*3/uL (ref 150–400)
RBC: 3.61 MIL/uL — ABNORMAL LOW (ref 3.87–5.11)
RDW: 18.6 % — ABNORMAL HIGH (ref 11.5–15.5)
WBC: 17.4 10*3/uL — ABNORMAL HIGH (ref 4.0–10.5)
nRBC: 0.5 % — ABNORMAL HIGH (ref 0.0–0.2)

## 2023-11-17 MED ORDER — POTASSIUM CHLORIDE CRYS ER 20 MEQ PO TBCR
30.0000 meq | EXTENDED_RELEASE_TABLET | Freq: Once | ORAL | Status: AC
  Administered 2023-11-17: 30 meq via ORAL
  Filled 2023-11-17: qty 1

## 2023-11-17 MED ORDER — LACTULOSE 10 GM/15ML PO SOLN: 20.0000 g | Freq: Every day | ORAL | Status: DC | PRN

## 2023-11-17 MED ORDER — LOPERAMIDE HCL 1 MG/7.5ML PO SUSP: 2.0000 mg | ORAL | Status: DC | PRN

## 2023-11-17 MED ORDER — DM-GUAIFENESIN ER 30-600 MG PO TB12: 1.0000 | ORAL_TABLET | Freq: Two times a day (BID) | ORAL | Status: DC | PRN

## 2023-11-17 MED ORDER — CALCIUM CARBONATE ANTACID 500 MG PO CHEW: 1.0000 | CHEWABLE_TABLET | ORAL | Status: DC | PRN

## 2023-11-17 NOTE — Plan of Care (Signed)
  Problem: Skin Integrity: Goal: Risk for impaired skin integrity will decrease Outcome: Progressing   Problem: Tissue Perfusion: Goal: Adequacy of tissue perfusion will improve Outcome: Progressing   Problem: Activity: Goal: Risk for activity intolerance will decrease Outcome: Progressing

## 2023-11-17 NOTE — Telephone Encounter (Signed)
 CORRECTION on my last note.  Form fee is $29.00.

## 2023-11-17 NOTE — Progress Notes (Addendum)
 Physical Therapy Treatment Patient Details Name: Katelyn Cline MRN: [id number] DOB: 1961-06-11 Today's Date: 11/17/2023   History of Present Illness 63 y.o. female presented 10/25/23 for the evaluation of unstable angina. 5/7 CABG x3, AVR; extubated 5/11; BiPAP 5/11  PMH- 3v CAD, atrial fibrillation, COPD oxygen  dependent 4L, 100 pack year smoking history, diabetes, HTN, HLD, PAD, moderate aortic stenosis, agoraphobia, fibromyalgia, narcolepsy, peripheral neuropathy   PT Comments  Pt in recliner upon arrival and agreeable to PT session. Pt limited in today's session by pain in B shoulders and neck. Pt was able to tolerate slight shoulder AROM with minimal increase in pain. Attempted x2 stands with MaxA from recliner with pt unable to achieve upright posture. Pt had B LE sliding anteriorly with no attempt to power-up. After short seated rest break, pt was able to complete stand with MinAx2 while holding cardiac pillow. She declined further gait training, but was agreeable to perform step-pivot to bed with MinAx2 and RW. Throughout session, pt needed multiple cues for adherence to sternal precautions with poor follow through from prior therapy sessions. Pt is progressing slowly towards goals. Recommending >3hrs post acute rehab to maximize rehab potential. Acute PT to follow.      If plan is discharge home, recommend the following: Assistance with cooking/housework;Assist for transportation;Help with stairs or ramp for entrance   Can travel by private vehicle      No  Equipment Recommendations  Rolling walker (2 wheels)       Precautions / Restrictions Precautions Precautions: Sternal;Fall Precaution Booklet Issued: Yes (comment) Recall of Precautions/Restrictions: Impaired Precaution/Restrictions Comments: frequent cues for adherance to sternal precautions. Restrictions Other Position/Activity Restrictions: sternal     Mobility  Bed Mobility Overal bed mobility: Needs Assistance Bed  Mobility: Rolling, Sit to Sidelying Rolling: Contact guard assist, Used rails     Sit to sidelying: Min assist General bed mobility comments: Cues for log roll technique with pt having difficulty staying on side. MinA for slight LE management.    Transfers Overall transfer level: Needs assistance Equipment used: Rolling walker (2 wheels) Transfers: Sit to/from Stand, Bed to chair/wheelchair/BSC Sit to Stand: Min assist, +2 physical assistance   Step pivot transfers: Min assist, +2 safety/equipment     General transfer comment: Two standing attempts from recliner with MaxA and use of momentum. Pt unable to clear bottom with B LE sliding anteriorly. With two person assist, pt required MinAx2 to stand and perform step-pivot. Multiple cues throughout for aderance to sternal precautions as pt would try to push with BUE on RW    Ambulation/Gait  General Gait Details: declined further gait      Balance Overall balance assessment: Needs assistance, Mild deficits observed, not formally tested Sitting-balance support: No upper extremity supported, Feet unsupported Sitting balance-Leahy Scale: Good     Standing balance support: Bilateral upper extremity supported, Reliant on assistive device for balance, During functional activity Standing balance-Leahy Scale: Poor Standing balance comment: reliant on UE support in standing      Communication Communication Communication: No apparent difficulties  Cognition Arousal: Alert Behavior During Therapy: WFL for tasks assessed/performed   PT - Cognitive impairments: Sequencing, Problem solving, Safety/Judgement  PT - Cognition Comments: Very poor adherance to sternal precautions. After education on precautions, pt occasionally follows cues. At times, pt disregards education for proper technique Following commands: Impaired Following commands impaired: Follows one step commands inconsistently, Only follows one step commands consistently     Cueing Cueing Techniques: Verbal cues, Visual cues  Exercises  Other Exercises Other Exercises: B shoulder retraction x5 reps, B forward/backwards rolls x5        Pertinent Vitals/Pain Pain Assessment Pain Assessment: Faces Faces Pain Scale: Hurts even more Pain Location: B shoulders and neck Pain Descriptors / Indicators: Discomfort, Grimacing Pain Intervention(s): Limited activity within patient's tolerance, Monitored during session, Patient requesting pain meds-RN notified, Repositioned     PT Goals (current goals can now be found in the care plan section) Acute Rehab PT Goals PT Goal Formulation: With patient Time For Goal Achievement: 11/22/23 Potential to Achieve Goals: Good Progress towards PT goals: Progressing toward goals    Frequency    Min 2X/week       AM-PAC PT "6 Clicks" Mobility   Outcome Measure  Help needed turning from your back to your side while in a flat bed without using bedrails?: A Little Help needed moving from lying on your back to sitting on the side of a flat bed without using bedrails?: A Little Help needed moving to and from a bed to a chair (including a wheelchair)?: A Lot Help needed standing up from a chair using your arms (e.g., wheelchair or bedside chair)?: A Lot Help needed to walk in hospital room?: Total Help needed climbing 3-5 steps with a railing? : Total 6 Click Score: 12    End of Session Equipment Utilized During Treatment: Gait belt;Oxygen  Activity Tolerance: Patient limited by pain Patient left: in bed;with call bell/phone within reach Nurse Communication: Mobility status (RN present during last half of session) PT Visit Diagnosis: Difficulty in walking, not elsewhere classified (R26.2);Other abnormalities of gait and mobility (R26.89)     Time: 1610-9604 PT Time Calculation (min) (ACUTE ONLY): 30 min  Charges:    $Therapeutic Exercise: 8-22 mins $Therapeutic Activity: 8-22 mins PT General Charges $$ ACUTE PT  VISIT: 1 Visit                    Orysia Blas, PT, DPT Secure Chat Preferred  Rehab Office [phone]   Alissa April Adela Ades 11/17/2023, 3:53 PM

## 2023-11-17 NOTE — Progress Notes (Addendum)
 [address]       [place] [address]             [phone]        14 Days Post-Op Procedure(s) (LRB): EXPLORATION POST OPERATIVE OPEN HEART; WASHOUT (N/A) ECHOCARDIOGRAM, TRANSESOPHAGEAL (N/A)  Subjective: Patient just waking up.  Objective: Vital signs in last 24 hours: Temp:  [97.8 F (36.6 C)-98.6 F (37 C)] 98.3 F (36.8 C) (05/21 0353) Pulse Rate:  [57-64] 64 (05/21 0353) Cardiac Rhythm: Sinus bradycardia (05/20 2359) Resp:  [15-20] 20 (05/21 0353) BP: (117-133)/(50-76) 129/76 (05/21 0353) SpO2:  [95 %-100 %] 97 % (05/21 0353) FiO2 (%):  [32 %] 32 % (05/20 1944) Weight:  [95.6 kg] 95.6 kg (05/21 0353)  Pre op weight 98.5 kg Current Weight  11/17/23 95.6 kg      Intake/Output from previous day: 05/20 0701 - 05/21 0700 In: 350 [P.O.:350] Out: 1000 [Urine:1000]   Physical Exam: Cardiovascular: RRR Pulmonary: Slightly diminished left base and right lung is clear Abdomen: Soft, obese, non tender, bowel sounds present. Extremities: Bilateral ankle/feet edema is much improved Wounds: Clean and dry.  No erythema or signs of infection.  Lab Results: CBC: Recent Labs    11/15/23 0348 11/17/23 0335  WBC 18.0* 17.4*  HGB 10.0* 10.2*  HCT 33.3* 33.8*  PLT 1,198* 1,221*   BMET:  Recent Labs    11/15/23 0348 11/17/23 0335  NA 137 138  K 3.7 3.8  CL 101 100  CO2 26 29  GLUCOSE 91 96  BUN 11 10  CREATININE 0.61 0.63  CALCIUM  8.9 8.5*    PT/INR:  Lab Results  Component Value Date   INR 2.1 (H) 11/03/2023   INR 1.5 (H) 11/03/2023   INR 1.1 10/28/2023   ABG:  INR: Will add last result for INR, ABG once components are confirmed Will add last 4 CBG results once components are confirmed  Assessment/Plan:  1. CV - History of a paroxysmal atrial fibrillation. Maintaining SR, SB at times. On Lisinopril  5 mg daily, Lopressor  12.5 mg bid, and Apixaban  5 mg bid.  2.  Pulmonary - History of COPD, OSA. Continue Brovana , Duoneb,  and Yupelri . On 2 liters of oxygen  via [place]. She was on oxygen  at home prior to admission. S/p left thoracentesis 05/14 with 1L of bloody fluid removed.  Encourage incentive spirometer.  3. Acute on chronic diastolic heart failure-On Jardiance  10 mg daily and Spironolactone  12.5 mg daily. Per cardiology, Lasix  80 mg IV bid given over the weekend with very good diuresis. Lasix  40 mg IV bid ordered for yesterday with transition to Lasix  40 mg orally today (per cardiology note yesterday). 4.  Expected post op acute blood loss anemia - H and H stable at 10.2 and 33.8 5. DM-CBGs 134/133/116. Scheduled Insulin  previously stopped to avoid further hypoglycemia. Sliding scale changed from resistant to sensitive. Pre op HGA1C 5.1. She was on Metformin  1000 mg bid prior to admission and will restart soon. Glucerna to help with appetite/oral intake 6. Leukocytosis and thrombocytosis-She has a history of polycythemia vera. WBC this am slightly decreased to 17,400 and platelets 1,221,000.  As recently discussed with Dr. Salomon Cree via secure chat, additional Hydrea  500 mg every other day.  She has lab checks and follow up with heme/oncology post op 7. K pad for neck pain 8. Deconditioned-continue with CR.  9. Supplement potassium 10. Disposition-Insurance did not approve CIR. Appeal in process. Likely, will not be approved for CIR.  Per patient this am, she does not want to go to SNF. IF appeal for CIR is denied, she wants to go home with son.  Katelyn M ZimmermanPA-C 6:56 AM  Agree Dispo planning  Katelyn Cline

## 2023-11-17 NOTE — Progress Notes (Signed)
 Inpatient Rehab Admissions Coordinator:   Notified by acute RN CM that [facility] had faxed confirmation of expedited appeal request and notice to send any supporting clinical documentation.  The request for appeal I sent yesterday was this patients clinical documentation, including cover letter, AOR form, and cover sheet all with my contact information on it.  I called [employer] with appeals/grievances dept [phone] ext [phone] and left voicemail to confirm receipt of all necessary information and to emphasize need to communicate directly with me for consistency and efficiency.  Expect appeal determination by late Friday/early Saturday.   Loye Rumble, PT, DPT Admissions Coordinator [phone] 11/17/23  12:40 PM

## 2023-11-18 LAB — GLUCOSE, CAPILLARY
Glucose-Capillary: 115 mg/dL — ABNORMAL HIGH (ref 70–99)
Glucose-Capillary: 106 mg/dL — ABNORMAL HIGH (ref 70–99)
Glucose-Capillary: 128 mg/dL — ABNORMAL HIGH (ref 70–99)
Glucose-Capillary: 163 mg/dL — ABNORMAL HIGH (ref 70–99)

## 2023-11-18 NOTE — Telephone Encounter (Signed)
 FMLA paperwork received and completed per Dr. Katheryne Pane.  Paperwork returned to Lisa Rideau to follow up.

## 2023-11-18 NOTE — Plan of Care (Signed)
  Problem: Education: Goal: Ability to describe self-care measures that may prevent or decrease complications (Diabetes Survival Skills Education) will improve Outcome: Progressing Goal: Individualized Educational Video(s) Outcome: Progressing   Problem: Coping: Goal: Ability to adjust to condition or change in health will improve Outcome: Progressing   Problem: Health Behavior/Discharge Planning: Goal: Ability to identify and utilize available resources and services will improve Outcome: Progressing

## 2023-11-18 NOTE — Progress Notes (Signed)
 Inpatient Rehab Admissions Coordinator:   Notified by Sherin Dingwall that another representative had contacted re: this appeal.  I, again, faxed our appeals request, now with updates from yesterday and today, and, again, requested that I be contacted to confirm receipt of these clinicals to ensure [employer] has all the appropriate documentation supporting CIR.   Loye Rumble, PT, DPT Admissions Coordinator [phone] 11/18/23  11:27 AM

## 2023-11-18 NOTE — Progress Notes (Addendum)
 [address]       [place] [address]             [phone]        15 Days Post-Op Procedure(s) (LRB): EXPLORATION POST OPERATIVE OPEN HEART; WASHOUT (N/A) ECHOCARDIOGRAM, TRANSESOPHAGEAL (N/A)  Subjective: Patient about to eat breakfast. She has no specific complaint this am.  Objective: Vital signs in last 24 hours: Temp:  [97.8 F (36.6 C)-98.5 F (36.9 C)] 98.5 F (36.9 C) (05/22 0327) Pulse Rate:  [57-68] 59 (05/22 0643) Cardiac Rhythm: Normal sinus rhythm (05/21 2100) Resp:  [18-20] 20 (05/22 0643) BP: (127-143)/(56-86) 142/58 (05/22 0327) SpO2:  [95 %-100 %] 100 % (05/22 0643) FiO2 (%):  [32 %] 32 % (05/21 2119) Weight:  [93.4 kg] 93.4 kg (05/22 0643)  Pre op weight 98.5 kg Current Weight  11/18/23 93.4 kg      Intake/Output from previous day: 05/21 0701 - 05/22 0700 In: 1000 [NG/GT:1000] Out: 1750 [Urine:1750]   Physical Exam: Cardiovascular: RRR Pulmonary: Clear Abdomen: Soft, obese, non tender, bowel sounds present. Extremities: Trace bilateral ankle/feet edema Wounds: Clean and dry.  No erythema or signs of infection.  Lab Results: CBC: Recent Labs    11/17/23 0335  WBC 17.4*  HGB 10.2*  HCT 33.8*  PLT 1,221*   BMET:  Recent Labs    11/17/23 0335  NA 138  K 3.8  CL 100  CO2 29  GLUCOSE 96  BUN 10  CREATININE 0.63  CALCIUM  8.5*    PT/INR:  Lab Results  Component Value Date   INR 2.1 (H) 11/03/2023   INR 1.5 (H) 11/03/2023   INR 1.1 10/28/2023   ABG:  INR: Will add last result for INR, ABG once components are confirmed Will add last 4 CBG results once components are confirmed  Assessment/Plan:  1. CV - History of a paroxysmal atrial fibrillation. Maintaining SR, SB at times. On Lisinopril  5 mg daily, Lopressor  12.5 mg bid, and Apixaban  5 mg bid.  2.  Pulmonary - History of COPD, OSA. Continue Brovana , Duoneb, and Yupelri . On 3 liters of oxygen  via [place]. She was on oxygen  at home prior to admission.  S/p left thoracentesis 05/14 with 1L of bloody fluid removed.  Encourage incentive spirometer.  3. Acute on chronic diastolic heart failure-On Jardiance  10 mg daily and Spironolactone  12.5 mg daily. Per cardiology, Lasix  80 mg IV bid given over the weekend with very good diuresis. Lasix  40 mg IV bid ordered for yesterday with transition to Lasix  40 mg orally today (per cardiology note yesterday). 4.  Expected post op acute blood loss anemia - H and H stable at 10.2 and 33.8 5. DM-CBGs 94/124/115. Scheduled Insulin  previously stopped to avoid further hypoglycemia. Sliding scale changed from resistant to sensitive. Pre op HGA1C 5.1. She was on Metformin  1000 mg bid prior to admission and will restart soon. Glucerna to help with appetite/oral intake 6. Leukocytosis and thrombocytosis-She has a history of polycythemia vera. WBC this am slightly decreased to 17,400 and platelets 1,221,000.  As recently discussed with Dr. Salomon Cree via secure chat, additional Hydrea  500 mg every other day.  She has lab checks and follow up with heme/oncology post op 41 Deconditioned-continue with CR.  8. Disposition-Insurance did not approve CIR. Appeal in process. Likely, will not be approved for CIR. If appeal for CIR is denied, I spoke with her again, and she does NOT want to go to SNF. She states "if I do not  go to CIR then I want to go home"  Donielle M ZimmermanPA-C 6:59 AM   Agree Dispo planning  Hilarie Lovely

## 2023-11-19 LAB — GLUCOSE, CAPILLARY
Glucose-Capillary: 95 mg/dL (ref 70–99)
Glucose-Capillary: 112 mg/dL — ABNORMAL HIGH (ref 70–99)
Glucose-Capillary: 120 mg/dL — ABNORMAL HIGH (ref 70–99)
Glucose-Capillary: 174 mg/dL — ABNORMAL HIGH (ref 70–99)

## 2023-11-19 MED ORDER — LISINOPRIL 10 MG PO TABS
10.0000 mg | ORAL_TABLET | Freq: Every day | ORAL | Status: DC
  Administered 2023-11-19 – 2023-11-25 (×7): 10 mg via ORAL
  Filled 2023-11-19 (×7): qty 1

## 2023-11-19 MED ORDER — OXYCODONE-ACETAMINOPHEN 5-325 MG PO TABS
2.0000 | ORAL_TABLET | Freq: Four times a day (QID) | ORAL | Status: DC | PRN
  Administered 2023-11-19 – 2023-11-25 (×15): 2 via ORAL
  Filled 2023-11-19 (×16): qty 2

## 2023-11-19 NOTE — Telephone Encounter (Signed)
 FMLA form faxed to [employer] and scanned to chart.  Billing notified.

## 2023-11-19 NOTE — Plan of Care (Signed)

## 2023-11-19 NOTE — Progress Notes (Addendum)
 [address]       [place] [address]             [phone]        16 Days Post-Op Procedure(s) (LRB): EXPLORATION POST OPERATIVE OPEN HEART; WASHOUT (N/A) ECHOCARDIOGRAM, TRANSESOPHAGEAL (N/A)  Subjective: Patient states hard to turn her neck this am and lright foot hurts. She denies any trauma to her right foot.  Objective: Vital signs in last 24 hours: Temp:  [97.8 F (36.6 C)-98.5 F (36.9 C)] 98 F (36.7 C) (05/23 0355) Pulse Rate:  [57-78] 61 (05/23 0355) Cardiac Rhythm: Normal sinus rhythm (05/22 1937) Resp:  [14-20] 20 (05/23 0500) BP: (117-151)/(54-99) 151/54 (05/23 0355) SpO2:  [93 %-100 %] 95 % (05/23 0355) Weight:  [93.1 kg] 93.1 kg (05/23 0500)  Pre op weight 98.5 kg Current Weight  11/19/23 93.1 kg      Intake/Output from previous day: 05/22 0701 - 05/23 0700 In: 240 [P.O.:240] Out: 1200 [Urine:1200]   Physical Exam: Cardiovascular: RRR Pulmonary: Clear Abdomen: Soft, obese, non tender, bowel sounds present. Extremities: Trace left ankle/feet edema;right lateral foot with swelling and some tenderness. Doppler DP/PTs bilaterally Wounds: Clean and dry.  No erythema or signs of infection.  Lab Results: CBC: Recent Labs    11/17/23 0335  WBC 17.4*  HGB 10.2*  HCT 33.8*  PLT 1,221*   BMET:  Recent Labs    11/17/23 0335  NA 138  K 3.8  CL 100  CO2 29  GLUCOSE 96  BUN 10  CREATININE 0.63  CALCIUM  8.5*    PT/INR:  Lab Results  Component Value Date   INR 2.1 (H) 11/03/2023   INR 1.5 (H) 11/03/2023   INR 1.1 10/28/2023   ABG:  INR: Will add last result for INR, ABG once components are confirmed Will add last 4 CBG results once components are confirmed  Assessment/Plan:  1. CV - History of a paroxysmal atrial fibrillation. Maintaining SR, SB at times. On Lisinopril  5 mg daily, Lopressor  12.5 mg bid, and Apixaban  5 mg bid. Will increase Lisinopril  for better BP control. 2.  Pulmonary - History of COPD,  OSA. Continue Brovana , Duoneb, and Yupelri . On 1 liter of oxygen  via [place]. She was on oxygen  at home prior to admission. S/p left thoracentesis 05/14 with 1L of bloody fluid removed.  Encourage incentive spirometer.  3. Acute on chronic diastolic heart failure-On Jardiance  10 mg daily and Spironolactone  12.5 mg daily. Per cardiology, continue Lasix  40 mg orally  4.  Expected post op acute blood loss anemia - H and H stable at 10.2 and 33.8 5. DM-CBGs 128/163/95. Scheduled Insulin  previously stopped to avoid further hypoglycemia. Sliding scale changed from resistant to sensitive. Pre op HGA1C 5.1. She was on Metformin  1000 mg bid prior to admission and will restart soon. Glucerna to help with appetite/oral intake 6. Leukocytosis and thrombocytosis-She has a history of polycythemia vera. Last WBC decreased to 17,400 and platelets up to 1,221,000.  As recently discussed with Dr. Salomon Cree via secure chat, additional Hydrea  500 mg every other day.  She has lab checks and follow up with heme/oncology post op 7. Regarding right foot pain, ice PRN. She was taking Percocet PRN for pain so added this to PRN pain medication 8. Deconditioned-continue with CR.  9. Disposition-Insurance did not approve CIR. Appeal in process. Likely, will not be approved for CIR. If appeal for CIR is denied, I spoke with her again and she does NOT want  to go to SNF. She states "if I do not go to CIR then I want to go home"  Deshane Cotroneo M ZimmermanPA-C 7:27 AM

## 2023-11-19 NOTE — Progress Notes (Signed)
 Occupational Therapy Treatment Patient Details Name: Katelyn Cline MRN: [id number] DOB: 11/09/1960 Today's Date: 11/19/2023   History of present illness 63 y.o. female presented 10/25/23 for the evaluation of unstable angina. 5/7 CABG x3, AVR; extubated 5/11; BiPAP 5/11  PMH- 3v CAD, atrial fibrillation, COPD oxygen  dependent 4L, 100 pack year smoking history, diabetes, HTN, HLD, PAD, moderate aortic stenosis, agoraphobia, fibromyalgia, narcolepsy, peripheral neuropathy   OT comments  Patient seated on EOB with mobility upon entry and required min assist to stand from EOB and to perform mobility in room. Patient perform self care seated/standing at sink with frequent rest breaks with SpO2 84% on RA and 94% on 2 liters. Patient transferred to recliner at end of session. Patient will benefit from intensive inpatient follow-up therapy, >3 hours/day. Acute OT to continue to follow to address established goals to facilitate DC to next venue of care.        If plan is discharge home, recommend the following:  A lot of help with bathing/dressing/bathroom;Assistance with cooking/housework;Assist for transportation;Help with stairs or ramp for entrance;A little help with walking and/or transfers;Direct supervision/assist for medications management;Direct supervision/assist for financial management   Equipment Recommendations  BSC/3in1    Recommendations for Other Services      Precautions / Restrictions Precautions Precautions: Sternal;Fall Precaution Booklet Issued: Yes (comment) Recall of Precautions/Restrictions: Impaired Precaution/Restrictions Comments: frequent cues for adherance to sternal precautions. Restrictions Weight Bearing Restrictions Per Provider Order: Yes RUE Weight Bearing Per Provider Order: Non weight bearing LUE Weight Bearing Per Provider Order: Non weight bearing Other Position/Activity Restrictions: sternal       Mobility Bed Mobility Overal bed mobility: Needs  Assistance             General bed mobility comments: EOB upon entry    Transfers Overall transfer level: Needs assistance Equipment used: Rolling walker (2 wheels) Transfers: Sit to/from Stand, Bed to chair/wheelchair/BSC Sit to Stand: Min assist           General transfer comment: min assist to stand due to sternal precautions and min to CGA for transfers     Balance Overall balance assessment: Needs assistance, Mild deficits observed, not formally tested Sitting-balance support: No upper extremity supported, Feet unsupported Sitting balance-Leahy Scale: Good     Standing balance support: Bilateral upper extremity supported, Reliant on assistive device for balance, During functional activity, Single extremity supported Standing balance-Leahy Scale: Poor Standing balance comment: reliant on external support                           ADL either performed or assessed with clinical judgement   ADL Overall ADL's : Needs assistance/impaired     Grooming: Wash/dry hands;Wash/dry face;Sitting;Standing Grooming Details (indicate cue type and reason): partially done in standing, completed in sitting Upper Body Bathing: Set up;Sitting               Toilet Transfer: Minimal assistance;Ambulation Toilet Transfer Details (indicate cue type and reason): simulated to recliner         Functional mobility during ADLs: Contact guard assist;Minimal assistance;Rolling walker (2 wheels) General ADL Comments: min assist for sit to stand and min to CGA for functional transfers and standing ADL tasks    Extremity/Trunk Assessment Upper Extremity Assessment Upper Extremity Assessment: Generalized weakness            Vision       Perception     Praxis     Communication Communication  Communication: No apparent difficulties   Cognition Arousal: Alert Behavior During Therapy: WFL for tasks assessed/performed       Awareness: Intellectual awareness  intact   Attention impairment (select first level of impairment): Divided attention                     Following commands: Impaired Following commands impaired: Follows one step commands inconsistently, Only follows one step commands consistently      Cueing   Cueing Techniques: Verbal cues, Visual cues  Exercises      Shoulder Instructions       General Comments 84%  SpO2 on RA and 94% on 2 liters    Pertinent Vitals/ Pain       Pain Assessment Pain Assessment: Faces Faces Pain Scale: Hurts even more Pain Location: B shoulder, neck, B feet Pain Descriptors / Indicators: Discomfort, Grimacing Pain Intervention(s): Monitored during session, Repositioned  Home Living                                          Prior Functioning/Environment              Frequency  Min 2X/week        Progress Toward Goals  OT Goals(current goals can now be found in the care plan section)  Progress towards OT goals: Progressing toward goals  Acute Rehab OT Goals OT Goal Formulation: With patient Time For Goal Achievement: 11/22/23 Potential to Achieve Goals: Good ADL Goals Pt Will Perform Grooming: with modified independence;standing Pt Will Perform Upper Body Dressing: with modified independence;sitting Pt Will Perform Lower Body Dressing: with modified independence;sit to/from stand Pt Will Transfer to Toilet: with modified independence;ambulating;bedside commode (over toilet) Pt Will Perform Toileting - Clothing Manipulation and hygiene: with modified independence;sit to/from stand Additional ADL Goal #1: Pt will generalize sternal precautions during mobility and ADLs.  Plan      Co-evaluation                 AM-PAC OT "6 Clicks" Daily Activity     Outcome Measure   Help from another person eating meals?: A Little Help from another person taking care of personal grooming?: A Little Help from another person toileting, which includes  using toliet, bedpan, or urinal?: A Lot Help from another person bathing (including washing, rinsing, drying)?: A Lot Help from another person to put on and taking off regular upper body clothing?: A Little Help from another person to put on and taking off regular lower body clothing?: A Lot 6 Click Score: 15    End of Session Equipment Utilized During Treatment: Rolling walker (2 wheels);Oxygen   OT Visit Diagnosis: Unsteadiness on feet (R26.81);Other abnormalities of gait and mobility (R26.89);Muscle weakness (generalized) (M62.81);Other symptoms and signs involving cognitive function   Activity Tolerance Patient tolerated treatment well   Patient Left in chair;with call bell/phone within reach   Nurse Communication Mobility status        Time: 8119-1478 OT Time Calculation (min): 31 min  Charges: OT General Charges $OT Visit: 1 Visit OT Treatments $Self Care/Home Management : 23-37 mins  Anitra Barn, OTA Acute Rehabilitation Services  Office [phone]   Jovita Nipper 11/19/2023, 12:49 PM

## 2023-11-19 NOTE — TOC Progression Note (Signed)
 Transition of Care [facility]) - Progression Note    Patient Details  Name: Katelyn Cline MRN: [id number] Date of Birth: 04-19-1961  [facility] CM/SW Contact  Juliane Och, LCSW Phone Number: 11/19/2023, 9:38 AM  Clinical Narrative:     9:38 AM Per chart review, appeal for CIR remains pending.  Expected Discharge Plan: IP Rehab Facility Barriers to Discharge: English as a second language teacher, Continued Medical Work up  Expected Discharge Plan and Services     Post Acute Care Choice: IP Rehab Living arrangements for the past 2 months: Single Family Home                                       Social Determinants of Health (SDOH) Interventions SDOH Screenings   Food Insecurity: No Food Insecurity (10/25/2023)  Housing: Low Risk  (10/25/2023)  Transportation Needs: No Transportation Needs (10/25/2023)  Utilities: Not At Risk (10/25/2023)  Alcohol  Screen: Low Risk  (06/18/2020)  Depression (PHQ2-9): High Risk (07/16/2023)  Financial Resource Strain: Low Risk  (12/24/2022)  Physical Activity: Inactive (12/24/2022)  Social Connections: Socially Isolated (10/25/2023)  Stress: Stress Concern Present (12/24/2022)  Tobacco Use: High Risk (11/03/2023)    Readmission Risk Interventions     No data to display

## 2023-11-19 NOTE — Progress Notes (Signed)
 Mobility Specialist Progress Note:    11/19/23 0911  Mobility  Activity Ambulated with assistance in room;Ambulated with assistance to bathroom;Stood at bedside;Transferred from bed to chair;Ambulated with assistance in hallway  Level of Assistance Minimal assist, patient does 75% or more  Assistive Device Front wheel walker  Distance Ambulated (ft) 24 ft  RUE Weight Bearing Per Provider Order NWB  LUE Weight Bearing Per Provider Order NWB  Activity Response Tolerated well  Mobility Referral Yes  Mobility visit 1 Mobility  Mobility Specialist Start Time (ACUTE ONLY) [id number]  Mobility Specialist Stop Time (ACUTE ONLY) 0906  Mobility Specialist Time Calculation (min) (ACUTE ONLY) 10 min   Pt received in bed, agreeable to mobility session. MinA required to sit EOB and stand with RW. Tolerated well, c/o BLE pain. Pt left with OT in room, sitting in chair by sink. Left with all needs met.  Alyda Megna Mobility Specialist Please contact via Special educational needs teacher or  Rehab office at [phone]

## 2023-11-19 NOTE — Progress Notes (Signed)
 Physical Therapy Treatment Patient Details Name: Katelyn Cline MRN: [id number] DOB: 10/28/1960 Today's Date: 11/19/2023   History of Present Illness 63 y.o. female presented 10/25/23 for the evaluation of unstable angina. 5/7 CABG x3, AVR; extubated 5/11; BiPAP 5/11  PMH- 3v CAD, atrial fibrillation, COPD oxygen  dependent 4L, 100 pack year smoking history, diabetes, HTN, HLD, PAD, moderate aortic stenosis, agoraphobia, fibromyalgia, narcolepsy, peripheral neuropathy    PT Comments  Pt up in chair on arrival and agreeable to session with good progress towards acute goals. Pt able to progress activity tolerance with x3 gait bouts with min A to steady and RW support as well as a chair follow for safety as pt continues to fatigue quickly and with RLE pain needing seated recovery breaks between bouts. Pt continues to require max cues for adherence to sternal precautions throughout all mobility with poor carryover throughout session. Pt continues to benefit from skilled PT services to progress toward functional mobility goals.     If plan is discharge home, recommend the following: Assistance with cooking/housework;Assist for transportation;Help with stairs or ramp for entrance   Can travel by private vehicle        Equipment Recommendations  Rolling walker (2 wheels)    Recommendations for Other Services       Precautions / Restrictions Precautions Precautions: Sternal;Fall Precaution Booklet Issued: Yes (comment) Recall of Precautions/Restrictions: Impaired Precaution/Restrictions Comments: frequent cues for adherance to sternal precautions. Restrictions Weight Bearing Restrictions Per Provider Order: Yes Other Position/Activity Restrictions: sternal     Mobility  Bed Mobility Overal bed mobility: Needs Assistance           Sit to sidelying: Contact guard assist General bed mobility comments: CGA to return to bed at end of session, cues for technique    Transfers Overall  transfer level: Needs assistance Equipment used: Rolling walker (2 wheels), 1 person hand held assist Transfers: Sit to/from Stand, Bed to chair/wheelchair/BSC Sit to Stand: Min assist   Step pivot transfers: Min assist       General transfer comment: min A to stand due to sternal precautions and min to CGA for transfers, HHA to step pivot chair>BSC at start of session    Ambulation/Gait Ambulation/Gait assistance: Min assist, +2 safety/equipment Gait Distance (Feet): 25 Feet (+ 50' + 75') Assistive device: Rolling walker (2 wheels) Gait Pattern/deviations: Step-through pattern, Decreased stride length Gait velocity: decr     General Gait Details: min A to steady with encouragement to continue, +2 for chair follow as pt qucik to fatigue needing seated rest breaks   Stairs             Wheelchair Mobility     Tilt Bed    Modified Rankin (Stroke Patients Only)       Balance Overall balance assessment: Needs assistance, Mild deficits observed, not formally tested Sitting-balance support: No upper extremity supported, Feet unsupported Sitting balance-Leahy Scale: Good     Standing balance support: Bilateral upper extremity supported, Reliant on assistive device for balance, During functional activity, Single extremity supported Standing balance-Leahy Scale: Poor Standing balance comment: reliant on external support                            Communication Communication Communication: No apparent difficulties  Cognition Arousal: Alert Behavior During Therapy: [facility] for tasks assessed/performed  Following commands: Impaired Following commands impaired: Follows one step commands inconsistently, Only follows one step commands consistently    Cueing Cueing Techniques: Verbal cues, Visual cues  Exercises      General Comments General comments (skin integrity, edema, etc.): VSS on supplemental O2      Pertinent  Vitals/Pain Pain Assessment Pain Assessment: Faces Faces Pain Scale: Hurts a little bit Pain Location: neck, B feet Pain Descriptors / Indicators: Discomfort, Grimacing Pain Intervention(s): Monitored during session, Limited activity within patient's tolerance    Home Living                          Prior Function            PT Goals (current goals can now be found in the care plan section) Acute Rehab PT Goals PT Goal Formulation: With patient Time For Goal Achievement: 11/22/23 Progress towards PT goals: Progressing toward goals    Frequency    Min 2X/week      PT Plan      Co-evaluation              AM-PAC PT "6 Clicks" Mobility   Outcome Measure  Help needed turning from your back to your side while in a flat bed without using bedrails?: A Little Help needed moving from lying on your back to sitting on the side of a flat bed without using bedrails?: A Little Help needed moving to and from a bed to a chair (including a wheelchair)?: A Lot Help needed standing up from a chair using your arms (e.g., wheelchair or bedside chair)?: A Lot Help needed to walk in hospital room?: A Lot Help needed climbing 3-5 steps with a railing? : Total 6 Click Score: 13    End of Session Equipment Utilized During Treatment: Oxygen  Activity Tolerance: Patient tolerated treatment well Patient left: in bed;with call bell/phone within reach Nurse Communication: Mobility status;Other (comment) (pt request pain cream) PT Visit Diagnosis: Difficulty in walking, not elsewhere classified (R26.2);Other abnormalities of gait and mobility (R26.89)     Time: 1426-1450 PT Time Calculation (min) (ACUTE ONLY): 24 min  Charges:    $Gait Training: 23-37 mins PT General Charges $$ ACUTE PT VISIT: 1 Visit                     Katelyn Cline R. PTA Acute Rehabilitation Services Office: [phone]   Agapito Horseman 11/19/2023, 3:16 PM

## 2023-11-20 ENCOUNTER — Inpatient Hospital Stay (HOSPITAL_COMMUNITY)

## 2023-11-20 LAB — GLUCOSE, CAPILLARY
Glucose-Capillary: 148 mg/dL — ABNORMAL HIGH (ref 70–99)
Glucose-Capillary: 119 mg/dL — ABNORMAL HIGH (ref 70–99)
Glucose-Capillary: 108 mg/dL — ABNORMAL HIGH (ref 70–99)
Glucose-Capillary: 140 mg/dL — ABNORMAL HIGH (ref 70–99)

## 2023-11-20 NOTE — Progress Notes (Addendum)
 17 Days Post-Op Procedure(s) (LRB): EXPLORATION POST OPERATIVE OPEN HEART; WASHOUT (N/A) ECHOCARDIOGRAM, TRANSESOPHAGEAL (N/A) Subjective: C/o foot pain  Objective: Vital signs in last 24 hours: Temp:  [97.7 F (36.5 C)-98.2 F (36.8 C)] 98 F (36.7 C) (05/24 0838) Pulse Rate:  [58-62] 59 (05/24 0838) Cardiac Rhythm: Normal sinus rhythm (05/23 1936) Resp:  [17-21] 18 (05/24 0838) BP: (124-146)/(50-70) 146/50 (05/24 0838) SpO2:  [90 %-100 %] 99 % (05/24 0838) Weight:  [92.9 kg] 92.9 kg (05/24 0620)  Hemodynamic parameters for last 24 hours:    Intake/Output from previous day: 05/23 0701 - 05/24 0700 In: -  Out: 1050 [Urine:1050] Intake/Output this shift: No intake/output data recorded.  General appearance: alert, cooperative, and no distress Heart: regular rate and rhythm Lungs: dim left base Abdomen: benign Extremities: min edema, right foot tender and swollen with some medial echymosis Wound: incis healing well  Lab Results: No results for input(s): "WBC", "HGB", "HCT", "PLT" in the last 72 hours. BMET: No results for input(s): "NA", "K", "CL", "CO2", "GLUCOSE", "BUN", "CREATININE", "CALCIUM " in the last 72 hours.  PT/INR: No results for input(s): "LABPROT", "INR" in the last 72 hours. ABG    Component Value Date/Time   PHART 7.425 11/10/2023 1750   HCO3 23.6 11/10/2023 1750   TCO2 25 11/10/2023 1750   ACIDBASEDEF 1.0 11/10/2023 1750   O2SAT 92 11/10/2023 1750   CBG (last 3)  Recent Labs    11/19/23 1619 11/19/23 2114 11/20/23 0612  GLUCAP 120* 174* 148*    Meds Scheduled Meds:  apixaban   5 mg Oral BID   arformoterol   15 mcg Nebulization BID   aspirin  EC  81 mg Oral Daily   Or   aspirin   81 mg Per Tube Daily   atorvastatin   80 mg Oral QHS   bisacodyl   10 mg Oral Daily   Or   bisacodyl   10 mg Rectal Daily   buPROPion  ER  100 mg Oral BID   DULoxetine   60 mg Oral BID   empagliflozin   10 mg Oral Daily   ezetimibe   10 mg Oral Daily   feeding  supplement (GLUCERNA SHAKE)  237 mL Oral TID WC   fenofibrate   160 mg Oral Daily   furosemide   40 mg Oral Daily   hydroxyurea   1,000 mg Oral Daily   hydroxyurea   500 mg Oral Q M,W,F   insulin  aspart  0-9 Units Subcutaneous TID WC   lisinopril   10 mg Oral Daily   metoprolol  tartrate  12.5 mg Oral BID   omega-3 acid ethyl esters  1 g Oral BID   pantoprazole   40 mg Oral BID   revefenacin   175 mcg Nebulization Daily   sodium chloride  flush  3 mL Intravenous Q12H   spironolactone   12.5 mg Oral Daily   Continuous Infusions: PRN Meds:.acetaminophen , calcium  carbonate, dextromethorphan-guaiFENesin , hydrALAZINE , ipratropium-albuterol , lactulose , loperamide HCl, metoprolol  tartrate, ondansetron  (ZOFRAN ) IV, mouth rinse, oxyCODONE -acetaminophen , sodium chloride  flush, traMADol   Xrays No results found.  Assessment/Plan: S/P Procedure(s) (LRB): EXPLORATION POST OPERATIVE OPEN HEART; WASHOUT (N/A) ECHOCARDIOGRAM, TRANSESOPHAGEAL (N/A)   1 afeb, s BP 120's-140's, HR 50's- 60's , sinus 2 O2 sats low at times, - on 2 liters [place] 3 voiding- not all measured 4 DM - adeq control- restart metformin  at some point 5 no new labs 6 awaiting results of CIR appeal, other option will be SNF- cont therapies 7 conts management of PCV- per hematology  8 conts pulm hygiene, was on O2 PTA 9 cont pain meds- component of  neuropathy as well 10 will get repeat CXR to see if effus has reaccumulated 11 foot Xray to make sure no pathologic fx    LOS: 26 days    Lindi Revering PA-C Pager [phone] 11/20/2023   Agree Dispo planning  Zeeva Courser [name]

## 2023-11-20 NOTE — Progress Notes (Signed)
 Mobility Specialist Progress Note:    11/20/23 0850  Mobility  Activity Ambulated with assistance in room;Transferred from bed to chair  Level of Assistance Minimal assist, patient does 75% or more  Assistive Device Front wheel walker  Distance Ambulated (ft) 24 ft  RUE Weight Bearing Per Provider Order NWB  LUE Weight Bearing Per Provider Order NWB  Activity Response Tolerated well  Mobility Referral Yes  Mobility visit 1 Mobility  Mobility Specialist Start Time (ACUTE ONLY) 0850  Mobility Specialist Stop Time (ACUTE ONLY) 0905  Mobility Specialist Time Calculation (min) (ACUTE ONLY) 15 min   Pt received in bed, agreeable to mobility session. Ambulated in room. MinA required to stand with RW. Tolerated well, c/o RLE pain. Agreeable to sit in chair, left with all needs met, call bell in reach.  Dalvin Clipper Mobility Specialist Please contact via Special educational needs teacher or  Rehab office at [phone]

## 2023-11-21 ENCOUNTER — Inpatient Hospital Stay (HOSPITAL_COMMUNITY)

## 2023-11-21 LAB — GLUCOSE, CAPILLARY
Glucose-Capillary: 134 mg/dL — ABNORMAL HIGH (ref 70–99)
Glucose-Capillary: 135 mg/dL — ABNORMAL HIGH (ref 70–99)
Glucose-Capillary: 122 mg/dL — ABNORMAL HIGH (ref 70–99)
Glucose-Capillary: 154 mg/dL — ABNORMAL HIGH (ref 70–99)

## 2023-11-21 NOTE — Progress Notes (Signed)
 18 Days Post-Op Procedure(s) (LRB): EXPLORATION POST OPERATIVE OPEN HEART; WASHOUT (N/A) ECHOCARDIOGRAM, TRANSESOPHAGEAL (N/A) Subjective: Still primary c/o is foot pain( R), slow progress w/ rehab  Objective: Vital signs in last 24 hours: Temp:  [97.6 F (36.4 C)-98.3 F (36.8 C)] 97.6 F (36.4 C) (05/25 0723) Pulse Rate:  [54-75] 57 (05/25 0723) Cardiac Rhythm: Sinus bradycardia (05/25 0700) Resp:  [15-20] 17 (05/25 0723) BP: (127-140)/(45-80) 127/68 (05/25 0723) SpO2:  [96 %-100 %] 98 % (05/25 0723) Weight:  [95.1 kg] 95.1 kg (05/25 0636)  Hemodynamic parameters for last 24 hours:    Intake/Output from previous day: 05/24 0701 - 05/25 0700 In: 720 [P.O.:720] Out: 3350 [Urine:3350] Intake/Output this shift: No intake/output data recorded.  General appearance: alert, cooperative, and no distress Heart: regular rate and rhythm Lungs: dim left base Abdomen: benign Extremities: minor LE/ankle edema Wound: incis healing well  Lab Results: No results for input(s): "WBC", "HGB", "HCT", "PLT" in the last 72 hours. BMET: No results for input(s): "NA", "K", "CL", "CO2", "GLUCOSE", "BUN", "CREATININE", "CALCIUM " in the last 72 hours.  PT/INR: No results for input(s): "LABPROT", "INR" in the last 72 hours. ABG    Component Value Date/Time   PHART 7.425 11/10/2023 1750   HCO3 23.6 11/10/2023 1750   TCO2 25 11/10/2023 1750   ACIDBASEDEF 1.0 11/10/2023 1750   O2SAT 92 11/10/2023 1750   CBG (last 3)  Recent Labs    11/20/23 1549 11/20/23 2110 11/21/23 0627  GLUCAP 108* 140* 134*    Meds Scheduled Meds:  apixaban   5 mg Oral BID   arformoterol   15 mcg Nebulization BID   aspirin  EC  81 mg Oral Daily   Or   aspirin   81 mg Per Tube Daily   atorvastatin   80 mg Oral QHS   bisacodyl   10 mg Oral Daily   Or   bisacodyl   10 mg Rectal Daily   buPROPion  ER  100 mg Oral BID   DULoxetine   60 mg Oral BID   empagliflozin   10 mg Oral Daily   ezetimibe   10 mg Oral Daily    feeding supplement (GLUCERNA SHAKE)  237 mL Oral TID WC   fenofibrate   160 mg Oral Daily   furosemide   40 mg Oral Daily   hydroxyurea   1,000 mg Oral Daily   hydroxyurea   500 mg Oral Q M,W,F   insulin  aspart  0-9 Units Subcutaneous TID WC   lisinopril   10 mg Oral Daily   metoprolol  tartrate  12.5 mg Oral BID   omega-3 acid ethyl esters  1 g Oral BID   pantoprazole   40 mg Oral BID   revefenacin   175 mcg Nebulization Daily   sodium chloride  flush  3 mL Intravenous Q12H   spironolactone   12.5 mg Oral Daily   Continuous Infusions: PRN Meds:.acetaminophen , calcium  carbonate, dextromethorphan-guaiFENesin , hydrALAZINE , ipratropium-albuterol , lactulose , loperamide HCl, metoprolol  tartrate, ondansetron  (ZOFRAN ) IV, mouth rinse, oxyCODONE -acetaminophen , sodium chloride  flush, traMADol   Xrays DG Chest 2 View Result Date: 11/21/2023 CLINICAL DATA:  Pleural effusion on the left EXAM: CHEST - 2 VIEW COMPARISON:  11/12/2023 FINDINGS: Moderate left pleural effusion with underlying pulmonary opacity, non progressed. Cardiopericardial enlargement with valve replacement, CABG, and atrial clipping. No pneumothorax. IMPRESSION: Unchanged left pleural effusion opacifying the left base. Electronically Signed   By: Ronnette Coke M.D.   On: 11/21/2023 08:38   DG Foot 2 Views Right Result Date: 11/20/2023 CLINICAL DATA:  [id number] Pain aggravated by activities of daily living [id number] EXAM: RIGHT FOOT - 2 VIEW  COMPARISON:  None Available. FINDINGS: No acute fracture or dislocation. Joint spaces and alignment are maintained. No area of erosion or osseous destruction. Well corticated ossific density along the medial head of the first proximal phalanx likely reflecting an accessory ossicle. Soft tissues are unremarkable. IMPRESSION: No acute fracture or dislocation. Electronically Signed   By: Clancy Crimes M.D.   On: 11/20/2023 13:50    Assessment/Plan: S/P Procedure(s) (LRB): EXPLORATION POST OPERATIVE OPEN  HEART; WASHOUT (N/A) ECHOCARDIOGRAM, TRANSESOPHAGEAL (N/A)  1 afeb, VSS, SR/SB, h/o PAF, cont AHF recs for GDMT- acute on chronic DHF 2 O2 sats good on 2 liters, cont nebs, h/o OSA, h/o home O2 3 voiding well, excellent diuresis- on daily lasix  40 q day 4 right Foot Xray, no fx or dislocation- still has pain, son to bring in brace which she says helps , doesn't appear clinically to be gout- will check Uric acid 5 CXR- mod left effus, may need to consider repeat thoracentesis 6 BS adeq control- poss restart metformin  at some point, on jardiance  per AHF 7 conts rehab and pulm hygiene- awaits placement decision w/ CIR appeal 8 pain meds- cont same for now 9 on apixiban for afib history, following rec for PCV by hematology 10 will get F/U labs in am   LOS: 27 days    Lindi Revering PA-C Pager [phone] 11/21/2023

## 2023-11-21 NOTE — Progress Notes (Signed)
 Mobility Specialist Progress Note:    11/21/23 0848  Mobility  Activity Ambulated with assistance to bathroom;Ambulated with assistance in room;Ambulated with assistance in hallway;Transferred to/from [facility];Transferred from bed to chair  Level of Assistance Standby assist, set-up cues, supervision of patient - no hands on  Assistive Device Front wheel walker  Distance Ambulated (ft) 120 ft  RUE Weight Bearing Per Provider Order NWB  LUE Weight Bearing Per Provider Order NWB  Activity Response Tolerated well  Mobility Referral Yes  Mobility visit 1 Mobility  Mobility Specialist Start Time (ACUTE ONLY) 0848  Mobility Specialist Stop Time (ACUTE ONLY) 0905  Mobility Specialist Time Calculation (min) (ACUTE ONLY) 17 min   Pt received in bed, agreeable to mobility session. Ambulated in room and hallway, to/from [facility], and transferred to chair after session. Void successful and pt able to complete peri care. SBA with RW for safety. Pt took one sitting rest break while in hallway before returning back to room. Left sitting up in chair with all needs met, c/o RLE pain in foot which was left elevated with pillows. Call bell and phone in reach.   Taler Kushner Mobility Specialist Please contact via Special educational needs teacher or  Rehab office at [phone]

## 2023-11-22 LAB — CBC
HCT: 32.8 % — ABNORMAL LOW (ref 36.0–46.0)
Hemoglobin: 9.9 g/dL — ABNORMAL LOW (ref 12.0–15.0)
MCH: 28 pg (ref 26.0–34.0)
MCHC: 30.2 g/dL (ref 30.0–36.0)
MCV: 92.9 fL (ref 80.0–100.0)
Platelets: 568 10*3/uL — ABNORMAL HIGH (ref 150–400)
RBC: 3.53 MIL/uL — ABNORMAL LOW (ref 3.87–5.11)
RDW: 18.6 % — ABNORMAL HIGH (ref 11.5–15.5)
WBC: 13 10*3/uL — ABNORMAL HIGH (ref 4.0–10.5)
nRBC: 0.2 % (ref 0.0–0.2)

## 2023-11-22 LAB — URIC ACID: Uric Acid, Serum: 4.5 mg/dL (ref 2.5–7.1)

## 2023-11-22 LAB — BASIC METABOLIC PANEL WITH GFR
Anion gap: 10 (ref 5–15)
BUN: 14 mg/dL (ref 8–23)
CO2: 31 mmol/L (ref 22–32)
Calcium: 9.4 mg/dL (ref 8.9–10.3)
Chloride: 98 mmol/L (ref 98–111)
Creatinine, Ser: 0.64 mg/dL (ref 0.44–1.00)
GFR, Estimated: >60 mL/min (ref >60)
Glucose, Bld: 122 mg/dL — ABNORMAL HIGH (ref 70–99)
Potassium: 3.9 mmol/L (ref 3.5–5.1)
Sodium: 139 mmol/L (ref 135–145)

## 2023-11-22 LAB — GLUCOSE, CAPILLARY
Glucose-Capillary: 108 mg/dL — ABNORMAL HIGH (ref 70–99)
Glucose-Capillary: 115 mg/dL — ABNORMAL HIGH (ref 70–99)
Glucose-Capillary: 124 mg/dL — ABNORMAL HIGH (ref 70–99)
Glucose-Capillary: 130 mg/dL — ABNORMAL HIGH (ref 70–99)

## 2023-11-22 MED ORDER — METFORMIN HCL ER 500 MG PO TB24
1000.0000 mg | ORAL_TABLET | Freq: Two times a day (BID) | ORAL | Status: DC
Start: 2023-11-22 — End: 2023-11-25
  Administered 2023-11-22 – 2023-11-25 (×6): 1000 mg via ORAL
  Filled 2023-11-22 (×6): qty 2

## 2023-11-22 NOTE — Progress Notes (Addendum)
 [address]       [place] [address]             [phone]      19 Days Post-Op Procedure(s) (LRB): EXPLORATION POST OPERATIVE OPEN HEART; WASHOUT (N/A) ECHOCARDIOGRAM, TRANSESOPHAGEAL (N/A) Subjective: Patient states her right foot pain is better with percocet and Tramadol .   Objective: Vital signs in last 24 hours: Temp:  [98 F (36.7 C)-98.4 F (36.9 C)] 98.1 F (36.7 C) (05/26 0857) Pulse Rate:  [51-68] 57 (05/26 0857) Cardiac Rhythm: Sinus bradycardia (05/26 0700) Resp:  [15-20] 18 (05/26 0857) BP: (123-164)/(55-79) 136/62 (05/26 0857) SpO2:  [95 %-99 %] 98 % (05/26 0857) Weight:  [91.5 kg] 91.5 kg (05/26 0601)  Hemodynamic parameters for last 24 hours:    Intake/Output from previous day: 05/25 0701 - 05/26 0700 In: -  Out: 2150 [Urine:2150] Intake/Output this shift: No intake/output data recorded.  General appearance: alert, cooperative, and no distress Neurologic: intact Heart: regular rate and rhythm, no murmur, + pericardial rub  Lungs: diminished left basilar breath sounds Abdomen: soft, non-tender; bowel sounds normal; no masses,  no organomegaly Extremities: edema 1+ BLE Wound: Clean and dry without sign of infection  Lab Results: Recent Labs    11/22/23 0329  WBC 13.0*  HGB 9.9*  HCT 32.8*  PLT 568*   BMET:  Recent Labs    11/22/23 0329  NA 139  K 3.9  CL 98  CO2 31  GLUCOSE 122*  BUN 14  CREATININE 0.64  CALCIUM  9.4    PT/INR: No results for input(s): "LABPROT", "INR" in the last 72 hours. ABG    Component Value Date/Time   PHART 7.425 11/10/2023 1750   HCO3 23.6 11/10/2023 1750   TCO2 25 11/10/2023 1750   ACIDBASEDEF 1.0 11/10/2023 1750   O2SAT 92 11/10/2023 1750   CBG (last 3)  Recent Labs    11/21/23 1623 11/21/23 2103 11/22/23 0605  GLUCAP 122* 154* 108*    Assessment/Plan: S/P Procedure(s) (LRB): EXPLORATION POST OPERATIVE OPEN HEART; WASHOUT (N/A) ECHOCARDIOGRAM, TRANSESOPHAGEAL  (N/A)  CV: Hx of PAF, now SB-NSR HR 50s-60s. On Eliquis . SBP mostly 120s-130s. Continue Lisinopril  10mg  daily and Lopressor  12.5mg  BID. Acute on chronic diastolic heart failure on Jardiance  and Spironolactone . Pericardial rub on exam. Possible pericarditis, consider colchicine?  Pulm: Required O2 at home prior to surgery. Saturating well on 2L [place] this AM. Required L thoracentesis on 05/14. CXR yesterday with recurrent left pleural effusion, will discuss repeat thoracentesis with surgeon.  GI: +Bm, tolerating a diet.   Endo: Preop A1C 5.1. CBGs controlled on Jardiance  and SSI with meals. Will restart home Metformin  1000mg  BID.   Renal: Cr 0.64. UO 2150cc/24hrs recorded. Under preop weight, -8lbs from yesterday? Placed on Lasix  by cardiology and edema on exam, will continue Lasix  40mg  daily. K 3.9.   ID: Likely reactive leukocytosis trending down. Tmax 98.4.   Expected postop ABLA: H/H stable 9.9/32.8, not clinically significant at this time.   Polycythemia vera: Platelet count much improved, 568,000 this AM. On Hydrea  1000mg  daily and an extra 500mg  MWF as discussed with Dr. Salomon Cree. Will follow up with heme/onc as an outpatient.   DVT Prophylaxis: On Eliquis   Right foot pain: Xray with no fx or dislocation, uric acid was normal and does not clinically appear to be gout. Son to bring brace from home. Was taking Percocet PRN for right foot pain prior to admission, this has helped the pain.   Deconditioning: Continue  to work with PT/OT  Dispo: Insurance denied CIR, appeal is in process. If denied again patient wants to go home with son and home health.    LOS: 28 days    Randa Burton, PA-C 11/22/2023  Agree with above Dispo planning  Nashae Maudlin [name]

## 2023-11-22 NOTE — Progress Notes (Signed)
 Physical Therapy Treatment Patient Details Name: Katelyn Cline MRN: [id number] DOB: 19-Apr-1961 Today's Date: 11/22/2023   History of Present Illness 63 y.o. female presented 10/25/23 for the evaluation of unstable angina. 5/7 CABG x3, AVR; extubated 5/11; BiPAP 5/11  PMH- 3v CAD, atrial fibrillation, COPD oxygen  dependent 4L, 100 pack year smoking history, diabetes, HTN, HLD, PAD, moderate aortic stenosis, agoraphobia, fibromyalgia, narcolepsy, peripheral neuropathy    PT Comments  Pt resting in bed on arrival and agreeable to session with good progress towards acute goals. Pt demonstrating increased activity tolerance this date, progressing gait distance without need for seated recovery break, with grossly CGA for safety and RW for support. Pt continues to require the most assist, up to min A, to power up to stand while maintaining sternal precautions during transfer. Pt needing max cues for hand placement and general precaution adherence as pt with poor carryover from recall to implementation during activity.  Pt continues to benefit from skilled PT services to progress toward functional mobility goals.      If plan is discharge home, recommend the following: Assistance with cooking/housework;Assist for transportation;Help with stairs or ramp for entrance   Can travel by private vehicle        Equipment Recommendations  Rolling walker (2 wheels)    Recommendations for Other Services       Precautions / Restrictions Precautions Precautions: Sternal;Fall Precaution Booklet Issued: Yes (comment) Recall of Precautions/Restrictions: Impaired Precaution/Restrictions Comments: frequent cues for adherance to sternal precautions. Restrictions Weight Bearing Restrictions Per Provider Order: Yes Other Position/Activity Restrictions: sternal     Mobility  Bed Mobility Overal bed mobility: Needs Assistance Bed Mobility: Rolling, Sit to Sidelying Rolling: Used rails, Supervision Sidelying  to sit: Supervision       General bed mobility comments: supervision for safety    Transfers Overall transfer level: Needs assistance Equipment used: Rolling walker (2 wheels), 1 person hand held assist Transfers: Sit to/from Stand, Bed to chair/wheelchair/BSC Sit to Stand: Min assist           General transfer comment: min A to stand due to sternal precautions with pt needing cues for rocking technique and hand placement,    Ambulation/Gait Ambulation/Gait assistance: Contact guard assist Gait Distance (Feet): 175 Feet Assistive device: Rolling walker (2 wheels) Gait Pattern/deviations: Step-through pattern, Decreased stride length Gait velocity: decr     General Gait Details: CGA for safety, no overt LOB noted, pt without need for seated rest this session   Stairs             Wheelchair Mobility     Tilt Bed    Modified Rankin (Stroke Patients Only)       Balance Overall balance assessment: Needs assistance, Mild deficits observed, not formally tested Sitting-balance support: No upper extremity supported, Feet unsupported Sitting balance-Leahy Scale: Good     Standing balance support: Bilateral upper extremity supported, Reliant on assistive device for balance, During functional activity, Single extremity supported Standing balance-Leahy Scale: Poor Standing balance comment: reliant on external support                            Communication Communication Communication: No apparent difficulties  Cognition Arousal: Alert Behavior During Therapy: WFL for tasks assessed/performed   PT - Cognitive impairments: Sequencing, Problem solving, Safety/Judgement                       PT - Cognition Comments: Very  poor adherance to sternal precautions. After education on precautions, pt occasionally follows cues. At times, pt disregards education for proper technique Following commands: Impaired Following commands impaired: Follows one  step commands inconsistently, Only follows one step commands consistently    Cueing Cueing Techniques: Verbal cues, Tactile cues, Visual cues  Exercises      General Comments General comments (skin integrity, edema, etc.): VSS on supplemental O2      Pertinent Vitals/Pain Pain Assessment Pain Assessment: Faces Faces Pain Scale: Hurts little more Pain Location: R foot Pain Descriptors / Indicators: Grimacing, Guarding, Sharp, Stabbing Pain Intervention(s): Limited activity within patient's tolerance, Monitored during session, Repositioned    Home Living                          Prior Function            PT Goals (current goals can now be found in the care plan section) Acute Rehab PT Goals Patient Stated Goal: return home PT Goal Formulation: With patient Time For Goal Achievement: 11/22/23 Progress towards PT goals: Progressing toward goals    Frequency    Min 2X/week      PT Plan      Co-evaluation              AM-PAC PT "6 Clicks" Mobility   Outcome Measure  Help needed turning from your back to your side while in a flat bed without using bedrails?: A Little Help needed moving from lying on your back to sitting on the side of a flat bed without using bedrails?: A Little Help needed moving to and from a bed to a chair (including a wheelchair)?: A Lot Help needed standing up from a chair using your arms (e.g., wheelchair or bedside chair)?: A Lot Help needed to walk in hospital room?: A Little Help needed climbing 3-5 steps with a railing? : Total 6 Click Score: 14    End of Session Equipment Utilized During Treatment: Oxygen  Activity Tolerance: Patient tolerated treatment well Patient left: with call bell/phone within reach;in chair Nurse Communication: Mobility status PT Visit Diagnosis: Difficulty in walking, not elsewhere classified (R26.2);Other abnormalities of gait and mobility (R26.89)     Time: 1478-2956 PT Time Calculation  (min) (ACUTE ONLY): 24 min  Charges:    $Gait Training: 23-37 mins PT General Charges $$ ACUTE PT VISIT: 1 Visit                     Corra Kaine R. PTA Acute Rehabilitation Services Office: [phone]   Agapito Horseman 11/22/2023, 12:38 PM

## 2023-11-23 ENCOUNTER — Inpatient Hospital Stay (HOSPITAL_COMMUNITY)

## 2023-11-23 LAB — GLUCOSE, CAPILLARY
Glucose-Capillary: 116 mg/dL — ABNORMAL HIGH (ref 70–99)
Glucose-Capillary: 115 mg/dL — ABNORMAL HIGH (ref 70–99)
Glucose-Capillary: 141 mg/dL — ABNORMAL HIGH (ref 70–99)
Glucose-Capillary: 156 mg/dL — ABNORMAL HIGH (ref 70–99)

## 2023-11-23 MED ORDER — LIDOCAINE HCL 1 % IJ SOLN
INTRAMUSCULAR | Status: AC
  Filled 2023-11-23: qty 20

## 2023-11-23 MED ORDER — LIDOCAINE HCL (PF) 1 % IJ SOLN: 10.0000 mL | Freq: Once | INTRAMUSCULAR | Status: DC

## 2023-11-23 NOTE — Progress Notes (Signed)
 Speech Language Pathology Treatment: Cognitive-Linquistic  Patient Details Name: Katelyn Cline MRN: [id number] DOB: [birth year] Today's Date: 11/23/2023 Time: 7829-5621 SLP Time Calculation (min) (ACUTE ONLY): 13 min  Assessment / Plan / Recommendation Clinical Impression  Pt demonstrates significant improvements related to areas of previously noted cognitive impairment. Noted denial from insurance for AIR and recommendation updated to [facility] SLP if pt has adequate supervision (per notes, pt's son plans to take off two weeks to provide 24/7 supervision). She states she typically uses "pill pack" which delivers pre-packaged medications to her home and they are separated in different bags according to recommended time of day at which they are intended to be taken. Completed a medication management task, asking pt to identify errors in a pillbox which she did with Min prompting. Pt is agreeable to ongoing SLP f/u acutely and at time of discharge.    HPI HPI: 63 y.o. female presented 10/25/23 for the evaluation of unstable angina. 5/7 CABG x3, AVR; extubated 5/11; BiPAP 5/11  PMH- 3v CAD, atrial fibrillation, COPD oxygen  dependent 4L, 100 pack year smoking history, diabetes, HTN, HLD, PAD, moderate aortic stenosis, agoraphobia, fibromyalgia, narcolepsy, peripheral neuropathy      SLP Plan  Continue with current plan of care      Recommendations for follow up therapy are one component of a multi-disciplinary discharge planning process, led by the attending physician.  Recommendations may be updated based on patient status, additional functional criteria and insurance authorization.    Recommendations                     Oral care BID   Intermittent Supervision/Assistance Cognitive communication deficit (H08.657)     Continue with current plan of care     Amil Kale, M.A., CCC-SLP Speech Language Pathology, Acute Rehabilitation Services  Secure Chat  preferred [phone]   11/23/2023, 5:05 PM

## 2023-11-23 NOTE — Plan of Care (Signed)
 Problem: Education: Goal: Ability to describe self-care measures that may prevent or decrease complications (Diabetes Survival Skills Education) will improve Outcome: Progressing   Problem: Coping: Goal: Ability to adjust to condition or change in health will improve Outcome: Progressing   Problem: Fluid Volume: Goal: Ability to maintain a balanced intake and output will improve Outcome: Progressing   Problem: Health Behavior/Discharge Planning: Goal: Ability to identify and utilize available resources and services will improve Outcome: Progressing Goal: Ability to manage health-related needs will improve Outcome: Progressing   Problem: Metabolic: Goal: Ability to maintain appropriate glucose levels will improve Outcome: Progressing   Problem: Nutritional: Goal: Maintenance of adequate nutrition will improve Outcome: Progressing Goal: Progress toward achieving an optimal weight will improve Outcome: Progressing   Problem: Skin Integrity: Goal: Risk for impaired skin integrity will decrease Outcome: Progressing   Problem: Tissue Perfusion: Goal: Adequacy of tissue perfusion will improve Outcome: Progressing   Problem: Education: Goal: Knowledge of General Education information will improve Description: Including pain rating scale, medication(s)/side effects and non-pharmacologic comfort measures Outcome: Progressing   Problem: Health Behavior/Discharge Planning: Goal: Ability to manage health-related needs will improve Outcome: Progressing   Problem: Clinical Measurements: Goal: Ability to maintain clinical measurements within normal limits will improve Outcome: Progressing Goal: Will remain free from infection Outcome: Progressing Goal: Diagnostic test results will improve Outcome: Progressing Goal: Respiratory complications will improve Outcome: Progressing Goal: Cardiovascular complication will be avoided Outcome: Progressing   Problem: Activity: Goal:  Risk for activity intolerance will decrease Outcome: Progressing   Problem: Nutrition: Goal: Adequate nutrition will be maintained Outcome: Progressing   Problem: Coping: Goal: Level of anxiety will decrease Outcome: Progressing   Problem: Elimination: Goal: Will not experience complications related to bowel motility Outcome: Progressing Goal: Will not experience complications related to urinary retention Outcome: Progressing   Problem: Pain Managment: Goal: General experience of comfort will improve and/or be controlled Outcome: Progressing   Problem: Safety: Goal: Ability to remain free from injury will improve Outcome: Progressing   Problem: Skin Integrity: Goal: Risk for impaired skin integrity will decrease Outcome: Progressing   Problem: Education: Goal: Will demonstrate proper wound care and an understanding of methods to prevent future damage Outcome: Progressing Goal: Knowledge of disease or condition will improve Outcome: Progressing Goal: Knowledge of the prescribed therapeutic regimen will improve Outcome: Progressing   Problem: Activity: Goal: Risk for activity intolerance will decrease Outcome: Progressing   Problem: Cardiac: Goal: Will achieve and/or maintain hemodynamic stability Outcome: Progressing   Problem: Education: Goal: Knowledge of General Education information will improve Description: Including pain rating scale, medication(s)/side effects and non-pharmacologic comfort measures Outcome: Progressing   Problem: Health Behavior/Discharge Planning: Goal: Ability to manage health-related needs will improve Outcome: Progressing   Problem: Clinical Measurements: Goal: Ability to maintain clinical measurements within normal limits will improve Outcome: Progressing Goal: Will remain free from infection Outcome: Progressing Goal: Diagnostic test results will improve Outcome: Progressing Goal: Respiratory complications will  improve Outcome: Progressing Goal: Cardiovascular complication will be avoided Outcome: Progressing   Problem: Activity: Goal: Risk for activity intolerance will decrease Outcome: Progressing   Problem: Nutrition: Goal: Adequate nutrition will be maintained Outcome: Progressing   Problem: Coping: Goal: Level of anxiety will decrease Outcome: Progressing   Problem: Elimination: Goal: Will not experience complications related to bowel motility Outcome: Progressing Goal: Will not experience complications related to urinary retention Outcome: Progressing   Problem: Pain Managment: Goal: General experience of comfort will improve and/or be controlled Outcome: Progressing   Problem: Safety:  Goal: Ability to remain free from injury will improve Outcome: Progressing   Problem: Skin Integrity: Goal: Risk for impaired skin integrity will decrease Outcome: Progressing

## 2023-11-23 NOTE — TOC Progression Note (Signed)
 Transition of Care [facility]) - Progression Note    Patient Details  Name: Katelyn Cline MRN: [id number] Date of Birth: March 24, 1961  [facility] CM/SW Contact  Valery Gaucher, [place] Phone Number: 11/23/2023, 3:14 PM  Clinical Narrative:     CSW met with patient and her son. Patient and her son advised if denied by CIR, the plan will be to discharge home w/ [facility].   TOC will continue to follow and assist with discharge summary.  Liddie Reel, MSW, LCSW Clinical Social Worker    Expected Discharge Plan: IP Rehab Facility Barriers to Discharge: English as a second language teacher, Continued Medical Work up  Expected Discharge Plan and Services     Post Acute Care Choice: IP Rehab Living arrangements for the past 2 months: Single Family Home                                       Social Determinants of Health (SDOH) Interventions SDOH Screenings   Food Insecurity: No Food Insecurity (10/25/2023)  Housing: Low Risk  (10/25/2023)  Transportation Needs: No Transportation Needs (10/25/2023)  Utilities: Not At Risk (10/25/2023)  Alcohol  Screen: Low Risk  (06/18/2020)  Depression (PHQ2-9): High Risk (07/16/2023)  Financial Resource Strain: Low Risk  (12/24/2022)  Physical Activity: Inactive (12/24/2022)  Social Connections: Socially Isolated (10/25/2023)  Stress: Stress Concern Present (12/24/2022)  Tobacco Use: High Risk (11/03/2023)    Readmission Risk Interventions     No data to display

## 2023-11-23 NOTE — Procedures (Signed)
 PROCEDURE SUMMARY:  Successful image-guided left thoracentesis. Yielded 700 mL of hazy amber fluid. Pt tolerated procedure well. No immediate complications. EBL = trace   Specimen was not sent for labs. CXR ordered.  Please see imaging section of Epic for full dictation.  Strider Vallance H Darrie Macmillan PA-C 11/23/2023 10:35 AM

## 2023-11-23 NOTE — Progress Notes (Signed)
 Inpatient Rehab Admissions Coordinator:   [facility] and Appeals for update on our request.  Notified by automated line that determination was made on 5/20 and the denial was upheld.  I transferred to a representative who confirmed decision to deny was upheld via [place] on 5/20 and was sent to [facility] who upheld the denial.  I inquired where the communication was sent, since I provided my contact info multiple times throughout the process.  The representative noted that the denial letter was mailed to the member from [place] on 5/21.  She apologized for the lack of communication, acknowledging that the pt's LOS has now been increased by a full two weeks, waiting for this to resolve (initial request was filed on Wednesday 11/10/23, and we are receiving verbal/fax confirmation of the denial upholding on 11/24/23).  I notified TOC and Dr. Deloise Ferries and asked therapy to weigh in on updated f/u recommendations since we cannot pursue CIR without approval from [facility]  We will sign off at this time.   Loye Rumble, PT, DPT Admissions Coordinator [phone] 11/23/23  2:51 PM

## 2023-11-23 NOTE — Progress Notes (Signed)
 Occupational Therapy Treatment Patient Details Name: Katelyn Cline MRN: [id number] DOB: 09-18-1960 Today's Date: 11/23/2023   History of present illness 63 y.o. female presented 10/25/23 for the evaluation of unstable angina. 5/7 CABG x3, AVR; extubated 5/11; BiPAP 5/11  PMH- 3v CAD, atrial fibrillation, COPD oxygen  dependent 4L, 100 pack year smoking history, diabetes, HTN, HLD, PAD, moderate aortic stenosis, agoraphobia, fibromyalgia, narcolepsy, peripheral neuropathy   OT comments  Pt progressing toward established OT goals. Of note, denial from insurance to AIR and recommendation updated to [facility] with LCSW confirming son plans to take off two weeks to be home with pt. Focus session on functional application of sternal precautions during mobility and ADL. Pt needing min-mod cues at times for maintenance of precautions predominantly during transfers. Will continue to follow.       If plan is discharge home, recommend the following:  A lot of help with bathing/dressing/bathroom;Assistance with cooking/housework;Assist for transportation;Help with stairs or ramp for entrance;A little help with walking and/or transfers;Direct supervision/assist for medications management;Direct supervision/assist for financial management   Equipment Recommendations  BSC/3in1;Other (comment) (RW)    Recommendations for Other Services      Precautions / Restrictions Precautions Precautions: Sternal;Fall Precaution Booklet Issued: Yes (comment) Recall of Precautions/Restrictions: Impaired Precaution/Restrictions Comments: frequent cues for adherance to sternal precautions. Restrictions Weight Bearing Restrictions Per Provider Order: Yes RUE Weight Bearing Per Provider Order: Non weight bearing LUE Weight Bearing Per Provider Order: Non weight bearing Other Position/Activity Restrictions: sternal       Mobility Bed Mobility Overal bed mobility: Needs Assistance Bed Mobility: Rolling, Sidelying to  Sit Rolling: Used rails, Supervision Sidelying to sit: Supervision       General bed mobility comments: supervision for safety    Transfers Overall transfer level: Needs assistance Equipment used: Rolling walker (2 wheels), 1 person hand held assist Transfers: Sit to/from Stand, Bed to chair/wheelchair/BSC Sit to Stand: Contact guard assist           General transfer comment: use of momentum     Balance Overall balance assessment: Needs assistance, Mild deficits observed, not formally tested Sitting-balance support: No upper extremity supported, Feet unsupported Sitting balance-Leahy Scale: Good     Standing balance support: Bilateral upper extremity supported, During functional activity, No upper extremity supported, Reliant on assistive device for balance Standing balance-Leahy Scale: Poor                             ADL either performed or assessed with clinical judgement   ADL Overall ADL's : Needs assistance/impaired     Grooming: Wash/dry hands;Wash/dry face;Set up;Supervision/safety;Standing               Lower Body Dressing: Set up;Sitting/lateral leans Lower Body Dressing Details (indicate cue type and reason): slip on shoes Toilet Transfer: Contact guard assist;Ambulation;Rolling walker (2 wheels) Toilet Transfer Details (indicate cue type and reason): poor RW compliance Toileting- Clothing Manipulation and Hygiene: Supervision/safety;Sitting/lateral lean Toileting - Clothing Manipulation Details (indicate cue type and reason): for anterior pericare. Reviewed posterior pericare within precautions as well.     Functional mobility during ADLs: Contact guard assist;Rolling walker (2 wheels);Supervision/safety General ADL Comments: 1 minor LOB but self corrected without hands on assist    Extremity/Trunk Assessment Upper Extremity Assessment Upper Extremity Assessment: Generalized weakness   Lower Extremity Assessment Lower Extremity  Assessment: Defer to PT evaluation        Vision       Perception  Praxis     Communication Communication Communication: No apparent difficulties   Cognition Arousal: Alert Behavior During Therapy: WFL for tasks assessed/performed Cognition: Cognition impaired       Memory impairment (select all impairments): Short-term memory, Working memory     OT - Cognition Comments: able to follow directions but continues to have difficulty recalling and functoinally implementing sternal precautions despite being able to verbalize yes/no to whether actions OT is performing are within sternal precautions when asked                 Following commands: Impaired Following commands impaired: Only follows one step commands consistently, Follows multi-step commands inconsistently      Cueing   Cueing Techniques: Verbal cues, Tactile cues, Visual cues  Exercises      Shoulder Instructions       General Comments VSS on 2L supplemental O2    Pertinent Vitals/ Pain       Pain Assessment Pain Assessment: Faces Faces Pain Scale: Hurts little more Pain Location: R foot Pain Descriptors / Indicators: Grimacing, Guarding Pain Intervention(s): Monitored during session, Limited activity within patient's tolerance  Home Living                                          Prior Functioning/Environment              Frequency  Min 2X/week        Progress Toward Goals  OT Goals(current goals can now be found in the care plan section)  Progress towards OT goals: Progressing toward goals  Acute Rehab OT Goals OT Goal Formulation: With patient Time For Goal Achievement: 11/22/23 Potential to Achieve Goals: Good ADL Goals Pt Will Perform Grooming: with modified independence;standing Pt Will Perform Upper Body Dressing: with modified independence;sitting Pt Will Perform Lower Body Dressing: with modified independence;sit to/from stand Pt Will Transfer to  Toilet: with modified independence;ambulating;bedside commode (BSC over toilet) Pt Will Perform Toileting - Clothing Manipulation and hygiene: with modified independence;sit to/from stand Additional ADL Goal #1: Pt will generalize sternal precautions during mobility and ADLs.  Plan      Co-evaluation                 AM-PAC OT "6 Clicks" Daily Activity     Outcome Measure   Help from another person eating meals?: A Little Help from another person taking care of personal grooming?: A Little Help from another person toileting, which includes using toliet, bedpan, or urinal?: A Little Help from another person bathing (including washing, rinsing, drying)?: A Lot Help from another person to put on and taking off regular upper body clothing?: A Little Help from another person to put on and taking off regular lower body clothing?: A Lot 6 Click Score: 16    End of Session Equipment Utilized During Treatment: Rolling walker (2 wheels);Oxygen   OT Visit Diagnosis: Unsteadiness on feet (R26.81);Other abnormalities of gait and mobility (R26.89);Muscle weakness (generalized) (M62.81);Other symptoms and signs involving cognitive function   Activity Tolerance Patient tolerated treatment well   Patient Left with call bell/phone within reach;in bed;with bed alarm set   Nurse Communication Mobility status        Time: 2841-3244 OT Time Calculation (min): 31 min  Charges: OT General Charges $OT Visit: 1 Visit OT Treatments $Self Care/Home Management : 23-37 mins  Emery Hans, OTD, OTR/L [facility] Acute  Rehabilitation Office: [phone]   Emery Hans 11/23/2023, 4:26 PM

## 2023-11-23 NOTE — Progress Notes (Signed)
 OT Cancellation Note  Patient Details Name: Katelyn Cline MRN: [id number] DOB: Aug 07, 1960   Cancelled Treatment:    Reason Eval/Treat Not Completed: Patient at procedure or test/ unavailable (thoracentesis)  Ebony Goldstein Donyel Nester 11/23/2023, 10:42 AM  [place] OTR/L Acute Rehabilitation Services Office: [phone]

## 2023-11-23 NOTE — Progress Notes (Signed)
 [address]       [place] [address]             [phone]      20 Days Post-Op Procedure(s) (LRB): EXPLORATION POST OPERATIVE OPEN HEART; WASHOUT (N/A) ECHOCARDIOGRAM, TRANSESOPHAGEAL (N/A) Subjective: Sitting up in bed. No new concerns.  Says pain in her neck and shoulders and in her feet is better with Percocet alternating with tramadol  is better.    Objective: Vital signs in last 24 hours: Temp:  [97.8 F (36.6 C)-98.1 F (36.7 C)] 97.8 F (36.6 C) (05/27 0332) Pulse Rate:  [54-175] 54 (05/27 0332) Cardiac Rhythm: Sinus bradycardia (05/26 2100) Resp:  [13-19] 15 (05/27 0332) BP: (118-155)/(50-72) 155/61 (05/27 0332) SpO2:  [95 %-100 %] 100 % (05/27 0332)    Intake/Output from previous day: 05/26 0701 - 05/27 0700 In: -  Out: 2550 [Urine:2550] Intake/Output this shift: No intake/output data recorded.  General appearance: alert, cooperative, and mild distress Neurologic: intact Heart: regular rate and rhythm Lungs:  breath sounds shallow but clear Abdomen: soft, non-tender Extremities: trace bilateral ankle edema.  Feet are warm with brisk capillary refill.   Wounds: Clean and dry without signs of infection  Lab Results: Recent Labs    11/22/23 0329  WBC 13.0*  HGB 9.9*  HCT 32.8*  PLT 568*   BMET:  Recent Labs    11/22/23 0329  NA 139  K 3.9  CL 98  CO2 31  GLUCOSE 122*  BUN 14  CREATININE 0.64  CALCIUM  9.4    PT/INR: No results for input(s): "LABPROT", "INR" in the last 72 hours. ABG    Component Value Date/Time   PHART 7.425 11/10/2023 1750   HCO3 23.6 11/10/2023 1750   TCO2 25 11/10/2023 1750   ACIDBASEDEF 1.0 11/10/2023 1750   O2SAT 92 11/10/2023 1750   CBG (last 3)  Recent Labs    11/22/23 1603 11/22/23 2102 11/23/23 0554  GLUCAP 124* 130* 116*    Assessment/Plan: S/P Procedure(s) (LRB): EXPLORATION POST OPERATIVE OPEN HEART; WASHOUT (N/A) ECHOCARDIOGRAM, TRANSESOPHAGEAL (N/A)   -POD 21 CABG x  3, bioprosthetic AVR and application or LAA clip with subsequent mediastinal exploration for bleeding.  Has h/o chronic distolic heart failure. On Jardiance  and Spiro   BP stable.     -H/O atrial fibrillation prior to admission:  currently in SR on low-dose metoprolol  and Eliquis .   -Pulm: Required O2 at home prior to surgery. Saturating well on 2L [place].  Required L thoracentesis on 05/14. CXR this AM with recurrent left pleural effusion with associated ATX. Would probably benefit from repeat thoracentesis.  -GI: +having bowel function, tolerating a diet.   -Endo: Preop A1C 5.1. CBGs controlled on Jardiance  and SSI with meals. Rewumed Metformin  1000mg  BID.   -Renal: Cr 0.64, K+ 3.9. Wt is well below pre-op. Diuretic mgt per cardiology.   -Expected postop ABLA: H/H stable 9.9/32.8. Monitoring.   -Polycythemia vera: Platelet count improved. On Hydrea  1000mg  daily and an extra 500mg  MWF as discussed with Dr. Salomon Cree. Will follow up with heme/onc as an outpatient.   -DVT Prophylaxis: On Eliquis   -Right foot pain: Xray with no fx or dislocation, uric acid was normal and does not clinically appear to be gout. Son to bring brace from home. Was taking Percocet PRN for right foot pain prior to admission, this has helped the pain.   -Deconditioning: Continue to work with PT/OT  -Dispo: Insurance denied CIR, appeal is in process. If denied  again patient wants to go home with son and home health.    LOS: 29 days    Leata Providence, PA-C 11/23/2023

## 2023-11-24 LAB — BASIC METABOLIC PANEL WITH GFR
Anion gap: 7 (ref 5–15)
BUN: 19 mg/dL (ref 8–23)
CO2: 30 mmol/L (ref 22–32)
Calcium: 9.1 mg/dL (ref 8.9–10.3)
Chloride: 102 mmol/L (ref 98–111)
Creatinine, Ser: 0.68 mg/dL (ref 0.44–1.00)
GFR, Estimated: >60 mL/min (ref >60)
Glucose, Bld: 111 mg/dL — ABNORMAL HIGH (ref 70–99)
Potassium: 4.1 mmol/L (ref 3.5–5.1)
Sodium: 139 mmol/L (ref 135–145)

## 2023-11-24 LAB — CBC
HCT: 33.3 % — ABNORMAL LOW (ref 36.0–46.0)
Hemoglobin: 10.2 g/dL — ABNORMAL LOW (ref 12.0–15.0)
MCH: 27.9 pg (ref 26.0–34.0)
MCHC: 30.6 g/dL (ref 30.0–36.0)
MCV: 91.2 fL (ref 80.0–100.0)
Platelets: 438 10*3/uL — ABNORMAL HIGH (ref 150–400)
RBC: 3.65 MIL/uL — ABNORMAL LOW (ref 3.87–5.11)
RDW: 18.7 % — ABNORMAL HIGH (ref 11.5–15.5)
WBC: 12.6 10*3/uL — ABNORMAL HIGH (ref 4.0–10.5)
nRBC: 0 % (ref 0.0–0.2)

## 2023-11-24 LAB — GLUCOSE, CAPILLARY
Glucose-Capillary: 120 mg/dL — ABNORMAL HIGH (ref 70–99)
Glucose-Capillary: 120 mg/dL — ABNORMAL HIGH (ref 70–99)
Glucose-Capillary: 120 mg/dL — ABNORMAL HIGH (ref 70–99)
Glucose-Capillary: 130 mg/dL — ABNORMAL HIGH (ref 70–99)

## 2023-11-24 MED ORDER — MAGNESIUM CITRATE PO SOLN: 0.5000 | Freq: Once | ORAL | Status: DC | PRN

## 2023-11-24 MED ORDER — LACTULOSE 10 GM/15ML PO SOLN
20.0000 g | Freq: Once | ORAL | Status: DC
  Filled 2023-11-24: qty 30

## 2023-11-24 NOTE — Progress Notes (Signed)
 Physical Therapy Treatment Patient Details Name: Katelyn Cline MRN: [id number] DOB: 12/11/1960 Today's Date: 11/24/2023   History of Present Illness 63 y.o. female presented 10/25/23 for the evaluation of unstable angina. 5/7 CABG x3, AVR; extubated 5/11; BiPAP 5/11  PMH- 3v CAD, atrial fibrillation, COPD oxygen  dependent 4L, 100 pack year smoking history, diabetes, HTN, HLD, PAD, moderate aortic stenosis, agoraphobia, fibromyalgia, narcolepsy, peripheral neuropathy    PT Comments  Pt progressing well towards all goals. Pt's son present and very supportive. Pt continues to require max verbal cues to adhere to sternal precautions functionally however son with good understanding and reminds pt as well. Pt with generalized weakness and deconditioning but improving. Completed stair negotiation to mimic home set up utilizing sideways approach. Pt and son with good understanding. Acute PT to cont to follow.    If plan is discharge home, recommend the following: Assistance with cooking/housework;Assist for transportation;Help with stairs or ramp for entrance;A little help with walking and/or transfers   Can travel by private vehicle        Equipment Recommendations  Rolling walker (2 wheels)    Recommendations for Other Services       Precautions / Restrictions Precautions Precautions: Sternal;Fall Precaution Booklet Issued: Yes (comment) Recall of Precautions/Restrictions: Impaired Precaution/Restrictions Comments: frequent cues for functional adherance to sternal precautions. Restrictions Weight Bearing Restrictions Per Provider Order: No Other Position/Activity Restrictions: sternal     Mobility  Bed Mobility               General bed mobility comments: pt received sitting up in chair    Transfers Overall transfer level: Needs assistance Equipment used: Rolling walker (2 wheels), 1 person hand held assist Transfers: Sit to/from Stand Sit to Stand: Min assist            General transfer comment: multimodal cues for hand placement and use of momentum, assist to rise from recliner, CGA from [facility] over toilet and elevated bed    Ambulation/Gait Ambulation/Gait assistance: Contact guard assist Gait Distance (Feet): 120 Feet Assistive device: Rolling walker (2 wheels) Gait Pattern/deviations: Step-through pattern, Decreased stride length Gait velocity: decr     General Gait Details: CGA for safety, no overt LOB noted, pt without need for seated rest this session   Stairs Stairs: Yes Stairs assistance: Min assist Stair Management: One rail Right, Step to pattern, Sideways Number of Stairs: 1 (pt declined doing more) General stair comments: son present with good understanding of sideways technique   Wheelchair Mobility     Tilt Bed    Modified Rankin (Stroke Patients Only)       Balance Overall balance assessment: Needs assistance, Mild deficits observed, not formally tested Sitting-balance support: No upper extremity supported, Feet unsupported Sitting balance-Leahy Scale: Good     Standing balance support: Bilateral upper extremity supported, During functional activity, No upper extremity supported, Reliant on assistive device for balance Standing balance-Leahy Scale: Poor Standing balance comment: can release walker in static standing to pull up depends and at sink                            Communication Communication Communication: No apparent difficulties  Cognition Arousal: Alert Behavior During Therapy: Flat affect   PT - Cognitive impairments: Sequencing, Problem solving, Safety/Judgement                       PT - Cognition Comments: Very poor adherance to sternal  precautions. After education on precautions, pt occasionally follows cues. At times, pt disregards education for proper technique Following commands: Impaired Following commands impaired: Only follows one step commands consistently, Follows  multi-step commands inconsistently    Cueing Cueing Techniques: Verbal cues, Tactile cues, Visual cues  Exercises      General Comments General comments (skin integrity, edema, etc.): VSS on 2L [place], frequent standing rest stops, assisted to commode, verbal cues to not pull up on rail      Pertinent Vitals/Pain Pain Assessment Pain Assessment: Faces Faces Pain Scale: Hurts a little bit Pain Location: incision Pain Descriptors / Indicators: Grimacing, Guarding Pain Intervention(s): Monitored during session    Home Living                          Prior Function            PT Goals (current goals can now be found in the care plan section) Acute Rehab PT Goals Patient Stated Goal: return home PT Goal Formulation: With patient Time For Goal Achievement: 11/22/23 Potential to Achieve Goals: Good Progress towards PT goals: Progressing toward goals    Frequency    Min 2X/week      PT Plan      Co-evaluation              AM-PAC PT "6 Clicks" Mobility   Outcome Measure  Help needed turning from your back to your side while in a flat bed without using bedrails?: A Little Help needed moving from lying on your back to sitting on the side of a flat bed without using bedrails?: A Little Help needed moving to and from a bed to a chair (including a wheelchair)?: A Lot Help needed standing up from a chair using your arms (e.g., wheelchair or bedside chair)?: A Lot Help needed to walk in hospital room?: A Little Help needed climbing 3-5 steps with a railing? : A Little 6 Click Score: 16    End of Session Equipment Utilized During Treatment: Oxygen  Activity Tolerance: Patient tolerated treatment well Patient left: with call bell/phone within reach;in chair Nurse Communication: Mobility status PT Visit Diagnosis: Difficulty in walking, not elsewhere classified (R26.2);Other abnormalities of gait and mobility (R26.89)     Time: 4540-9811 PT Time Calculation  (min) (ACUTE ONLY): 29 min  Charges:    $Gait Training: 23-37 mins PT General Charges $$ ACUTE PT VISIT: 1 Visit                     Renaee Caro, PT, DPT Acute Rehabilitation Services Secure chat preferred Office #: [phone]    Jenna Moan 11/24/2023, 3:09 PM

## 2023-11-24 NOTE — Progress Notes (Signed)
 Occupational Therapy Treatment Patient Details Name: Katelyn Cline MRN: [id number] DOB: Feb 13, 1961 Today's Date: 11/24/2023   History of present illness 63 y.o. female presented 10/25/23 for the evaluation of unstable angina. 5/7 CABG x3, AVR; extubated 5/11; BiPAP 5/11  PMH- 3v CAD, atrial fibrillation, COPD oxygen  dependent 4L, 100 pack year smoking history, diabetes, HTN, HLD, PAD, moderate aortic stenosis, agoraphobia, fibromyalgia, narcolepsy, peripheral neuropathy   OT comments  Supportive son participating in session. Pt needs min assist to stand from lower surface of recliner, only CGA from [facility] over toilet and elevated bed. Repeated cues needed for hands on knees with pt continuing to push up or pull on walker. Son is aware of sternal precautions. Pt doffed front opening down with min assist and brief with set up to CGA. Educated in energy conservation strategies. Recommending HHOT.       If plan is discharge home, recommend the following:  Assistance with cooking/housework;Assist for transportation;Help with stairs or ramp for entrance;A little help with walking and/or transfers;Direct supervision/assist for medications management;Direct supervision/assist for financial management;A little help with bathing/dressing/bathroom   Equipment Recommendations  BSC/3in1 (RW)    Recommendations for Other Services      Precautions / Restrictions Precautions Precautions: Sternal;Fall Recall of Precautions/Restrictions: Impaired Precaution/Restrictions Comments: frequent cues for adherance to sternal precautions. Restrictions Weight Bearing Restrictions Per Provider Order: No       Mobility Bed Mobility Overal bed mobility: Needs Assistance Bed Mobility: Rolling, Sit to Sidelying Rolling: Modified independent (Device/Increase time)       Sit to sidelying: Supervision      Transfers Overall transfer level: Needs assistance Equipment used: Rolling walker (2 wheels), 1 person  hand held assist Transfers: Sit to/from Stand Sit to Stand: Min assist           General transfer comment: multimodal cues for hand placement and use of momentum, assist to rise from recliner, CGA from [facility] over toilet and elevated bed     Balance Overall balance assessment: Needs assistance, Mild deficits observed, not formally tested   Sitting balance-Leahy Scale: Good       Standing balance-Leahy Scale: Poor Standing balance comment: can release walker in static standing to pull up depends and at sink                           ADL either performed or assessed with clinical judgement   ADL Overall ADL's : Needs assistance/impaired     Grooming: Supervision/safety;Wash/dry hands;Standing           Upper Body Dressing : Sitting;Minimal assistance Upper Body Dressing Details (indicate cue type and reason): to doff front opening gown Lower Body Dressing: Set up;Sitting/lateral leans Lower Body Dressing Details (indicate cue type and reason): for depends Toilet Transfer: Supervision/safety;Ambulation;BSC/3in1;Rolling walker (2 wheels)   Toileting- Clothing Manipulation and Hygiene: Sit to/from stand;Supervision/safety       Functional mobility during ADLs: Supervision/safety;Rolling walker (2 wheels)      Extremity/Trunk Assessment              Vision       Perception     Praxis     Communication Communication Communication: No apparent difficulties   Cognition Arousal: Alert Behavior During Therapy: Flat affect Cognition: Cognition impaired       Memory impairment (select all impairments): Short-term memory, Working memory Attention impairment (select first level of impairment): Divided attention Executive functioning impairment (select all impairments): Sequencing, Reasoning, Problem solving OT -  Cognition Comments: not generalizing sternal precautions                   Following commands impaired: Only follows one step  commands consistently, Follows multi-step commands inconsistently      Cueing   Cueing Techniques: Verbal cues, Tactile cues, Visual cues  Exercises      Shoulder Instructions       General Comments      Pertinent Vitals/ Pain       Pain Assessment Pain Assessment: Faces Faces Pain Scale: Hurts a little bit Pain Location: incision Pain Descriptors / Indicators: Grimacing, Guarding Pain Intervention(s): Monitored during session  Home Living                                          Prior Functioning/Environment              Frequency  Min 2X/week        Progress Toward Goals  OT Goals(current goals can now be found in the care plan section)  Progress towards OT goals: Progressing toward goals  Acute Rehab OT Goals OT Goal Formulation: With patient Time For Goal Achievement: 12/08/23 Potential to Achieve Goals: Good ADL Goals Pt Will Perform Grooming: with modified independence;standing Pt Will Perform Upper Body Dressing: with modified independence;sitting Pt Will Perform Lower Body Dressing: with modified independence;sit to/from stand Pt Will Transfer to Toilet: with modified independence;ambulating;bedside commode Pt Will Perform Toileting - Clothing Manipulation and hygiene: with modified independence;sit to/from stand Additional ADL Goal #1: Pt will generalize sternal precautions during mobility and ADLs.  Plan      Co-evaluation                 AM-PAC OT "6 Clicks" Daily Activity     Outcome Measure   Help from another person eating meals?: None Help from another person taking care of personal grooming?: A Little Help from another person toileting, which includes using toliet, bedpan, or urinal?: A Little Help from another person bathing (including washing, rinsing, drying)?: A Little Help from another person to put on and taking off regular upper body clothing?: A Little Help from another person to put on and taking off  regular lower body clothing?: A Little 6 Click Score: 19    End of Session Equipment Utilized During Treatment: Rolling walker (2 wheels);Oxygen  (4L)  OT Visit Diagnosis: Unsteadiness on feet (R26.81);Other abnormalities of gait and mobility (R26.89);Muscle weakness (generalized) (M62.81);Other symptoms and signs involving cognitive function   Activity Tolerance Patient tolerated treatment well   Patient Left in bed;with call bell/phone within reach;with family/visitor present   Nurse Communication          Time: 1610-9604 OT Time Calculation (min): 28 min  Charges: OT General Charges $OT Visit: 1 Visit OT Treatments $Self Care/Home Management : 23-37 mins  Avanell Leigh, OTR/L Acute Rehabilitation Services Office: [phone]   Jonette Nestle 11/24/2023, 11:55 AM

## 2023-11-24 NOTE — TOC Progression Note (Signed)
 Transition of Care (TOC) - Progression Note  Sherin Dingwall RN, BSN Transitions of Care Unit 4E- RN Case Manager See Treatment Team for direct phone #   Patient Details  Name: Katelyn Cline MRN: [id number] Date of Birth: Apr 03, 1961  Transition of Care [facility]) CM/SW Contact  Jearld Min Myrtle Atta, RN Phone Number: 11/24/2023, 1:41 PM  Clinical Narrative:    Noted pt has been denied on appeal with insurance for CIR. Pt does not want to go to SNF and prefers to return home w/ [facility] and son's assistance.   Orders placed for [facility] and DME needs.   Cm spoke with pt and son at bedside, Address, phone # and PCP all confirmed. Son will transport home.   Pt has [employer] HMO plan that contracts w/ [employer] for all DME needs- CM will arrange RW and BSC w/ in house [employer] liaison for delivery to room prior to discharge- pt and son agreeable- call made to liaison to request delivery this afternoon with anticipated discharge for the am.   Discussed HH= list provided for [facility] choice Per CMS guidelines from [employer]ancing[employer] website with star ratings (copy placed in shadow chart)- pt and son would like time to review list- and will get in touch with CM regarding choice- contact info provided. TOC will follow up in am for [facility] and referral prior to discharge.    Expected Discharge Plan: IP Rehab Facility Barriers to Discharge: Barriers Resolved  Expected Discharge Plan and Services In-house Referral: Clinical Social Work Discharge Planning Services: CM Consult Post Acute Care Choice: Durable Medical Equipment, Home Health Living arrangements for the past 2 months: Single Family Home                 DME Arranged: Bedside commode, Walker rolling DME Agency: [employer] Date DME Agency Contacted: 11/24/23 Time DME Agency Contacted: 1200 Representative spoke with at DME Agency: Gladys Lamp HH Arranged: PT, OT, Speech Therapy           Social Determinants of Health (SDOH) Interventions SDOH Screenings   Food  Insecurity: No Food Insecurity (10/25/2023)  Housing: Low Risk  (10/25/2023)  Transportation Needs: No Transportation Needs (10/25/2023)  Utilities: Not At Risk (10/25/2023)  Alcohol  Screen: Low Risk  (06/18/2020)  Depression (PHQ2-9): High Risk (07/16/2023)  Financial Resource Strain: Low Risk  (12/24/2022)  Physical Activity: Inactive (12/24/2022)  Social Connections: Socially Isolated (10/25/2023)  Stress: Stress Concern Present (12/24/2022)  Tobacco Use: High Risk (11/03/2023)    Readmission Risk Interventions     No data to display

## 2023-11-24 NOTE — Progress Notes (Signed)
 Mobility Specialist Progress Note:   11/24/23 0912  Mobility  Activity Transferred from bed to chair  Level of Assistance Contact guard assist, steadying assist  Assistive Device Front wheel walker  Distance Ambulated (ft) 4 ft  RUE Weight Bearing Per Provider Order NWB  LUE Weight Bearing Per Provider Order NWB  Activity Response Tolerated well  Mobility Referral Yes  Mobility visit 1 Mobility  Mobility Specialist Start Time (ACUTE ONLY) 0912  Mobility Specialist Stop Time (ACUTE ONLY) [id number]  Mobility Specialist Time Calculation (min) (ACUTE ONLY) 6 min   Pt received in bed, agreeable to transfer B>C. Required CGA with RW. Tolerated well, asx throughout. Pt aware of PT/OT sessions later today. Left with all needs met, call bell in reach.   Katelyn Cline Mobility Specialist Please contact via Special educational needs teacher or  Rehab office at [phone]

## 2023-11-24 NOTE — Progress Notes (Signed)
 [address]       [place] [address]             [phone]      21 Days Post-Op Procedure(s) (LRB): EXPLORATION POST OPERATIVE OPEN HEART; WASHOUT (N/A) ECHOCARDIOGRAM, TRANSESOPHAGEAL (N/A) Subjective: Sitting up in bed. No new concerns.  Reports no real change in her breathing since the left thoracentesis yesterday ( drained).  Did not walk any yesterday. No BM in several days but is not uncomfortable.      Objective: Vital signs in last 24 hours: Temp:  [97.7 F (36.5 C)-98.4 F (36.9 C)] 98.1 F (36.7 C) (05/28 0337) Pulse Rate:  [57-77] 57 (05/28 0337) Cardiac Rhythm: Sinus bradycardia (05/27 1900) Resp:  [15-20] 20 (05/28 0631) BP: (109-129)/(47-93) 126/58 (05/28 0337) SpO2:  [96 %-100 %] 98 % (05/28 0337) Weight:  [89.4 kg] 89.4 kg (05/28 0631)    Intake/Output from previous day: 05/27 0701 - 05/28 0700 In: -  Out: 2050 [Urine:2050] Intake/Output this shift: No intake/output data recorded.  General appearance: alert, cooperative, and mild distress Neurologic: intact Heart: regular rate and rhythm Lungs:  breath sounds clear Abdomen: soft, non-tender Extremities: trace bilateral ankle edema.  Feet are warm with brisk capillary refill.   Wounds: All clean and dry without signs of infection  Lab Results: Recent Labs    11/22/23 0329  WBC 13.0*  HGB 9.9*  HCT 32.8*  PLT 568*   BMET:  Recent Labs    11/22/23 0329  NA 139  K 3.9  CL 98  CO2 31  GLUCOSE 122*  BUN 14  CREATININE 0.64  CALCIUM  9.4    PT/INR: No results for input(s): "LABPROT", "INR" in the last 72 hours. ABG    Component Value Date/Time   PHART 7.425 11/10/2023 1750   HCO3 23.6 11/10/2023 1750   TCO2 25 11/10/2023 1750   ACIDBASEDEF 1.0 11/10/2023 1750   O2SAT 92 11/10/2023 1750   CBG (last 3)  Recent Labs    11/23/23 1528 11/23/23 2106 11/24/23 0624  GLUCAP 141* 156* 120*    Assessment/Plan: S/P Procedure(s) (LRB): EXPLORATION POST  OPERATIVE OPEN HEART; WASHOUT (N/A) ECHOCARDIOGRAM, TRANSESOPHAGEAL (N/A)   -POD 21 CABG x 3, bioprosthetic AVR and application or LAA clip with subsequent mediastinal exploration for bleeding.  Has h/o chronic distolic heart failure. On Jardiance  and Spiro   BP stable.     -H/O atrial fibrillation prior to admission:  currently in SR at ~60/min on low-dose metoprolol  and Eliquis .   -Pulm: Long standing COPD with 100 pack-year smoking history requiring O2 at home prior to surgery. Saturating well on 2L [place].  Had L thoracentesis on 05/14 (1L)  and again on 5/27 ( ). Encouraging pulmonary hygiene with IS and ambulation.   -GI: tolerating diet, having bowel function but not in past few days. Lactulose  this morning.   -Endo: Preop A1C 5.1. CBGs controlled on Jardiance  and SSI with meals. Rewumed Metformin  1000mg  BID.   -Renal: Cr 0.64, K+ 3.9. Wt is well below pre-op.    -Expected postop ABLA: H/H stable 9.9/32.8. Monitoring.   -Polycythemia vera: Platelet count improved. On Hydrea  1000mg  daily and an extra 500mg  MWF as discussed with Dr. Salomon Cree. Will follow up with heme/onc as an outpatient.   -DVT Prophylaxis: On Eliquis   -Right foot pain: Xray with no fx or dislocation, uric acid was normal and does not clinically appear to be gout. Was taking Percocet PRN for right foot pain prior to  admission, this has helped the pain.   -Deconditioning: Continue to work with PT/OT  -Dispo: Insurance denied CIR and appeal was also denied. Plan discharge to home tomorrow with son and home health PT/OT.  DME rolling walker and 3 in 1 also ordered.      LOS: 30 days    Tesean Stump G. Caidynce Muzyka, PA-C 11/24/2023

## 2023-11-25 LAB — GLUCOSE, CAPILLARY: Glucose-Capillary: 110 mg/dL — ABNORMAL HIGH (ref 70–99)

## 2023-11-25 MED ORDER — OMEGA-3-ACID ETHYL ESTERS 1 G PO CAPS: 1.0000 g | ORAL_CAPSULE | Freq: Two times a day (BID) | ORAL | 1 refills | Status: DC

## 2023-11-25 MED ORDER — LISINOPRIL 10 MG PO TABS: 10.0000 mg | ORAL_TABLET | Freq: Every day | ORAL | 1 refills | Status: DC

## 2023-11-25 MED ORDER — METOPROLOL TARTRATE 25 MG PO TABS: 12.5000 mg | ORAL_TABLET | Freq: Two times a day (BID) | ORAL | 1 refills | Status: DC

## 2023-11-25 MED ORDER — FUROSEMIDE 40 MG PO TABS: 40.0000 mg | ORAL_TABLET | Freq: Every day | ORAL | 0 refills | Status: DC

## 2023-11-25 MED ORDER — EMPAGLIFLOZIN 10 MG PO TABS: 10.0000 mg | ORAL_TABLET | Freq: Every day | ORAL | 1 refills | Status: DC

## 2023-11-25 MED ORDER — ACETAMINOPHEN 325 MG PO TABS: 650.0000 mg | ORAL_TABLET | Freq: Four times a day (QID) | ORAL | Status: AC | PRN

## 2023-11-25 MED ORDER — SPIRONOLACTONE 25 MG PO TABS: 12.5000 mg | ORAL_TABLET | Freq: Every day | ORAL | 1 refills | Status: DC

## 2023-11-25 NOTE — Progress Notes (Signed)
 Mobility Specialist Progress Note:    11/25/23 0847  Mobility  Activity Ambulated with assistance to bathroom;Ambulated with assistance in room  Level of Assistance Minimal assist, patient does 75% or more  Assistive Device Front wheel walker  Distance Ambulated (ft) 15 ft  RUE Weight Bearing Per Provider Order NWB  LUE Weight Bearing Per Provider Order NWB  Activity Response Tolerated well  Mobility Referral Yes  Mobility visit 1 Mobility  Mobility Specialist Start Time (ACUTE ONLY) 0847  Mobility Specialist Stop Time (ACUTE ONLY) 0853  Mobility Specialist Time Calculation (min) (ACUTE ONLY) 6 min   Pt received dangling EOB, requesting assistance to ambulate to bathroom. Required MinA to stand with RW. Tolerated well, asx throughout. Pt left in bathroom and encouraged to pull bathroom call light when finished.   Lewis Keats Mobility Specialist Please contact via Special educational needs teacher or  Rehab office at [phone]

## 2023-11-25 NOTE — Progress Notes (Signed)
 Physical Therapy Treatment Patient Details Name: Katelyn Cline MRN: [id number] DOB: 08-28-1960 Today's Date: 11/25/2023   History of Present Illness 63 y.o. female presented 10/25/23 for the evaluation of unstable angina. 5/7 CABG x3, AVR; extubated 5/11; BiPAP 5/11  PMH- 3v CAD, atrial fibrillation, COPD oxygen  dependent 4L, 100 pack year smoking history, diabetes, HTN, HLD, PAD, moderate aortic stenosis, agoraphobia, fibromyalgia, narcolepsy, peripheral neuropathy    PT Comments  Pt up in chair on arrival and agreeable to session, with focus on continues stair training and sternal precaution adherence throughout mobility. Pt able to to ascend/descend 2 steps with side-stepping technique with good recall of sequencing from previous session. Pt continues to require max cues to adhere to sternal precautions during mobility with with varying success. Pt was educated on continued walker use to maximize functional independence, safety, and decrease risk for falls.  Pt continues to benefit from skilled PT services to progress toward functional mobility goals.     If plan is discharge home, recommend the following: Assistance with cooking/housework;Assist for transportation;Help with stairs or ramp for entrance;A little help with walking and/or transfers   Can travel by private vehicle        Equipment Recommendations  Rolling walker (2 wheels)    Recommendations for Other Services       Precautions / Restrictions Precautions Precautions: Sternal;Fall Precaution Booklet Issued: Yes (comment) Recall of Precautions/Restrictions: Impaired Precaution/Restrictions Comments: frequent cues for functional adherance to sternal precautions. Restrictions Weight Bearing Restrictions Per Provider Order: No Other Position/Activity Restrictions: sternal     Mobility  Bed Mobility Overal bed mobility: Needs Assistance             General bed mobility comments: pt received sitting up in chair     Transfers Overall transfer level: Needs assistance Equipment used: Rolling walker (2 wheels), 1 person hand held assist Transfers: Sit to/from Stand Sit to Stand: Min assist           General transfer comment: multimodal cues for hand placement and use of momentum, assist to rise from recliner,    Ambulation/Gait Ambulation/Gait assistance: Contact guard assist Gait Distance (Feet): 150 Feet Assistive device: Rolling walker (2 wheels) Gait Pattern/deviations: Step-through pattern, Decreased stride length Gait velocity: decr     General Gait Details: CGA for safety, no overt LOB noted, pt without need for seated rest this session   Stairs Stairs: Yes Stairs assistance: Min assist Stair Management: One rail Right, Step to pattern, Sideways Number of Stairs: 2 (pt decling more) General stair comments: no LOB, qucik to fatigue   Wheelchair Mobility     Tilt Bed    Modified Rankin (Stroke Patients Only)       Balance Overall balance assessment: Needs assistance, Mild deficits observed, not formally tested Sitting-balance support: No upper extremity supported, Feet unsupported Sitting balance-Leahy Scale: Good     Standing balance support: Bilateral upper extremity supported, During functional activity, No upper extremity supported, Reliant on assistive device for balance Standing balance-Leahy Scale: Poor Standing balance comment: can release walker in static standing to pull up depends and at sink                            Communication Communication Communication: No apparent difficulties  Cognition Arousal: Alert Behavior During Therapy: Flat affect   PT - Cognitive impairments: Sequencing, Problem solving, Safety/Judgement  PT - Cognition Comments: Very poor adherance to sternal precautions. After education on precautions, pt occasionally follows cues. At times, pt disregards education for proper  technique Following commands: Impaired Following commands impaired: Only follows one step commands consistently, Follows multi-step commands inconsistently    Cueing Cueing Techniques: Verbal cues, Tactile cues, Visual cues  Exercises      General Comments General comments (skin integrity, edema, etc.): VSS in 2L, frequent rest breaks      Pertinent Vitals/Pain Pain Assessment Pain Assessment: Faces Faces Pain Scale: Hurts a little bit Pain Location: incision Pain Descriptors / Indicators: Grimacing, Guarding Pain Intervention(s): Monitored during session, Limited activity within patient's tolerance    Home Living                          Prior Function            PT Goals (current goals can now be found in the care plan section) Acute Rehab PT Goals Patient Stated Goal: return home PT Goal Formulation: With patient Time For Goal Achievement: 11/22/23 Progress towards PT goals: Progressing toward goals    Frequency    Min 2X/week      PT Plan      Co-evaluation              AM-PAC PT "6 Clicks" Mobility   Outcome Measure  Help needed turning from your back to your side while in a flat bed without using bedrails?: A Little Help needed moving from lying on your back to sitting on the side of a flat bed without using bedrails?: A Little Help needed moving to and from a bed to a chair (including a wheelchair)?: A Lot Help needed standing up from a chair using your arms (e.g., wheelchair or bedside chair)?: A Lot Help needed to walk in hospital room?: A Little Help needed climbing 3-5 steps with a railing? : A Little 6 Click Score: 16    End of Session Equipment Utilized During Treatment: Oxygen  Activity Tolerance: Patient tolerated treatment well Patient left: with call bell/phone within reach;in chair Nurse Communication: Mobility status PT Visit Diagnosis: Difficulty in walking, not elsewhere classified (R26.2);Other abnormalities of gait  and mobility (R26.89)     Time: 6295-2841 PT Time Calculation (min) (ACUTE ONLY): 18 min  Charges:    $Gait Training: 8-22 mins PT General Charges $$ ACUTE PT VISIT: 1 Visit                     Aragon Scarantino R. PTA Acute Rehabilitation Services Office: [phone]   Agapito Horseman 11/25/2023, 11:45 AM

## 2023-11-25 NOTE — Progress Notes (Signed)
 [address]       [place] [address]             [phone]      22 Days Post-Op Procedure(s) (LRB): EXPLORATION POST OPERATIVE OPEN HEART; WASHOUT (N/A) ECHOCARDIOGRAM, TRANSESOPHAGEAL (N/A) Subjective:  Sitting up in bed. No new concerns.  Did more walking yesterday. Feels she is ready to return home today.       Objective: Vital signs in last 24 hours: Temp:  [97.7 F (36.5 C)-98.6 F (37 C)] 98 F (36.7 C) (05/29 0329) Pulse Rate:  [54-66] 54 (05/29 0603) Cardiac Rhythm: Normal sinus rhythm (05/28 2022) Resp:  [14-20] 14 (05/29 0603) BP: (108-131)/(55-91) 131/56 (05/29 0329) SpO2:  [95 %-98 %] 98 % (05/29 0603) Weight:  [89.1 kg] 89.1 kg (05/29 0603)    Intake/Output from previous day: 05/28 0701 - 05/29 0700 In: -  Out: 500 [Urine:500] Intake/Output this shift: No intake/output data recorded.  General appearance: alert, cooperative, and mild distress Neurologic: intact Heart: stable SR Lungs:  breath sounds clear Abdomen: soft, non-tender Extremities: no peripheral edema.  Feet are warm with brisk capillary refill.   Wounds: All clean and dry without signs of infection  Lab Results: Recent Labs    11/24/23 0807  WBC 12.6*  HGB 10.2*  HCT 33.3*  PLT 438*   BMET:  Recent Labs    11/24/23 0807  NA 139  K 4.1  CL 102  CO2 30  GLUCOSE 111*  BUN 19  CREATININE 0.68  CALCIUM  9.1    PT/INR: No results for input(s): "LABPROT", "INR" in the last 72 hours. ABG    Component Value Date/Time   PHART 7.425 11/10/2023 1750   HCO3 23.6 11/10/2023 1750   TCO2 25 11/10/2023 1750   ACIDBASEDEF 1.0 11/10/2023 1750   O2SAT 92 11/10/2023 1750   CBG (last 3)  Recent Labs    11/24/23 1619 11/24/23 2200 11/25/23 0553  GLUCAP 120* 130* 110*    Assessment/Plan: S/P Procedure(s) (LRB): EXPLORATION POST OPERATIVE OPEN HEART; WASHOUT (N/A) ECHOCARDIOGRAM, TRANSESOPHAGEAL (N/A)   -POD 22 CABG x 3, bioprosthetic AVR and  application or LAA clip with subsequent mediastinal exploration for bleeding.  Has h/o chronic distolic heart failure. On Jardiance  and Spiro   BP stable.     -H/O atrial fibrillation prior to admission:  currently in SR at ~60/min on low-dose metoprolol  and Eliquis .   -Pulm: Long standing COPD with 100 pack-year smoking history requiring O2 at home prior to surgery. Saturating well on 2L [place].  Had L thoracentesis on 05/14 (1L)  and again on 5/27 ( ). Encouraging pulmonary hygiene with IS and ambulation.   -GI: tolerating diet, having bowel function but not in past few days. Lactulose  this morning.   -Endo: Preop A1C 5.1. CBGs controlled on Jardiance  and SSI with meals. On Metformin  1000mg  BID.   -Renal: Cr 0.68, K+ 4.1. Wt is well below pre-op.    -Expected postop ABLA: Hct 33.   -Polycythemia vera: Platelet count improved. On Hydrea  1000mg  daily and an extra 500mg  MWF as discussed with Dr. Salomon Cree. Will follow up with heme/onc as an outpatient.   -DVT Prophylaxis: On Eliquis   -Right foot pain: resolved  -Deconditioning: continue PT/OT at home  -Dispo: Insurance denied CIR and appeal was also denied. Plan discharge to home today with son and home health PT/OT/SLP.  DME rolling walker and 3 in 1 also ordered.   Had home O2 prior to admission.  LOS: 31 days    Leata Providence, PA-C 11/25/2023

## 2023-11-25 NOTE — TOC Transition Note (Signed)
 Transition of Care [facility]) - Discharge Note   Patient Details  Name: Katelyn Cline MRN: [id number] Date of Birth: 1960-08-27  [facility]) CM/SW Contact:  Tom-Johnson, Angelique Ken, RN Phone Number: 11/25/2023, 10:00 AM   Clinical Narrative:     Patient is scheduled for discharge today.  Readmission Risk Assessment done. Home health info, hospital f/u and discharge instructions on AVS. Son, Myrtie Atkinson to transport at discharge.  No further TOC needs noted.       Final next level of care: Home w Home Health Services Barriers to Discharge: Barriers Resolved   Patient Goals and CMS Choice Patient states their goals for this hospitalization and ongoing recovery are:: To return home CMS Medicare.gov Compare Post Acute Care list provided to:: Patient Choice offered to / list presented to : Patient      Discharge Placement                Patient to be transferred to facility by: Son Name of family member notified: Myrtie Atkinson    Discharge Plan and Services Additional resources added to the After Visit Summary for   In-house Referral: Clinical Social Work Discharge Planning Services: [employer] Consult Post Acute Care Choice: Horticulturist, commercial, Home Health          DME Arranged: Bedside commode, Walker rolling DME Agency: [employer] Date DME Agency Contacted: 11/24/23 Time DME Agency Contacted: 1200 Representative spoke with at DME Agency: Gladys Lamp HH Arranged: PT, OT, Speech Therapy HH Agency: [facility] Date [facility] Agency Contacted: 11/25/23 Time HH Agency Contacted: [phone] Representative spoke with at [facility] Agency: Loetta Ringer  Social Drivers of Health (SDOH) Interventions SDOH Screenings   Food Insecurity: No Food Insecurity (10/25/2023)  Housing: Low Risk  (10/25/2023)  Transportation Needs: No Transportation Needs (10/25/2023)  Utilities: Not At Risk (10/25/2023)  Alcohol  Screen: Low Risk  (06/18/2020)  Depression (PHQ2-9): High Risk (07/16/2023)  Financial  Resource Strain: Low Risk  (12/24/2022)  Physical Activity: Inactive (12/24/2022)  Social Connections: Socially Isolated (10/25/2023)  Stress: Stress Concern Present (12/24/2022)  Tobacco Use: High Risk (11/03/2023)     Readmission Risk Interventions    11/25/2023    9:57 AM  Readmission Risk Prevention Plan  Transportation Screening Complete  PCP or Specialist Appt within 3-5 Days Complete  HRI or Home Care Consult Complete  Social Work Consult for Recovery Care Planning/Counseling Complete  Palliative Care Screening Not Applicable  Medication Review Oceanographer) Referral to Pharmacy

## 2023-11-25 NOTE — Progress Notes (Signed)
 CARDIAC REHAB PHASE I   Postop OHS education completed. Referral for CRP2 sent to [facility]. Plan for discharge ome today.   Ronny Colas, RN BSN 11/25/2023 9:37 AM

## 2023-11-25 NOTE — Progress Notes (Addendum)
 Discharge instructions reviewed with pt and her son, both verbalized understanding of instructions, handouts provided.  Copy of instructions given to pt. Pt informed her scripts were sent to her pharmacy for pick up. Pt's RW has been delivered to her room and it will go home with her, pt's BSC was ordered and will be delivered to the pt's home, pt has received a phone call from the company and they will deliver it to the home after she arrives home per pt/son. Pt getting dressed at this time. Pt's son states he has her O2 tank in the car for the pt's ride home.  Pt will be d/c'd via wheelchair with belongings, with O2 on and will be          escorted by staff.   Jacaria Colburn,RN SWOT   At 12:22-----pt asking for a script for her percocet----pt states she spoke with someone this morning asking for pain medication for a few days until she can get her appointment scheduled at the pain clinic and have her script renewed.  Pt states she had an appointment the day of or day after she came to the hospital. She has no pain medication at home.   Secure chat message sent to PA Roddenberry. Pt's nurse Jenine Mix, has paged him also, he is currently in surgery, he or another PA will be available soon and will come up to see the pt first.    While in the room with pt discussing the above, pt was on the phone with the pain clinic and has made an appointment for June 2, Monday at 12 noon to see them to renew her pain script.   Pt will need pain medication script to get her thru Monday June 2nd.   Addalynne Golding,RN SWOT

## 2023-11-26 ENCOUNTER — Ambulatory Visit: Payer: Self-pay | Admitting: Thoracic Surgery (Cardiothoracic Vascular Surgery)

## 2023-11-26 ENCOUNTER — Telehealth: Payer: Self-pay | Admitting: *Deleted

## 2023-11-26 NOTE — Transitions of Care (Post Inpatient/ED Visit) (Signed)
   11/26/2023  Name: GENEVIA BOULDIN MRN: [id number] DOB: 1961-05-14  Today's TOC FU Call Status: Today's TOC FU Call Status:: Unsuccessful Call (1st Attempt) Unsuccessful Call (1st Attempt) Date: 11/26/23  Attempted to reach the patient regarding the most recent Inpatient/ED visit.  Follow Up Plan: Additional outreach attempts will be made to reach the patient to complete the Transitions of Care (Post Inpatient/ED visit) call.   Cecilie Coffee [facility], BSN RN Care Manager/ Transition of Care [place]/ [facility] [phone]

## 2023-11-29 ENCOUNTER — Telehealth: Payer: Self-pay | Admitting: *Deleted

## 2023-11-29 NOTE — Transitions of Care (Post Inpatient/ED Visit) (Signed)
   11/29/2023  Name: Katelyn Cline MRN: [id number] DOB: 1960/12/28  Today's TOC FU Call Status: Today's TOC FU Call Status:: Unsuccessful Call (2nd Attempt) Unsuccessful Call (2nd Attempt) Date: 11/29/23  Attempted to reach the patient regarding the most recent Inpatient/ED visit.  Follow Up Plan: Additional outreach attempts will be made to reach the patient to complete the Transitions of Care (Post Inpatient/ED visit) call.   Cecilie Coffee [facility], BSN RN Care Manager/ Transition of Care [place]/ [facility] [phone]

## 2023-11-30 ENCOUNTER — Telehealth: Payer: Self-pay | Admitting: *Deleted

## 2023-11-30 NOTE — Transitions of Care (Post Inpatient/ED Visit) (Signed)
   11/30/2023  Name: Katelyn Cline MRN: [id number] DOB: September 07, 1960  Today's TOC FU Call Status: Today's TOC FU Call Status:: Unsuccessful Call (3rd Attempt) Unsuccessful Call (3rd Attempt) Date: 11/30/23  Attempted to reach the patient regarding the most recent Inpatient/ED visit.  Follow Up Plan: No further outreach attempts will be made at this time. We have been unable to contact the patient.  Cecilie Coffee [facility], BSN RN Care Manager/ Transition of Care [place]/ [facility] [phone]

## 2023-12-03 ENCOUNTER — Ambulatory Visit
Payer: Self-pay | Attending: Thoracic Surgery (Cardiothoracic Vascular Surgery) | Admitting: Thoracic Surgery (Cardiothoracic Vascular Surgery)

## 2023-12-03 DIAGNOSIS — Z952 Presence of prosthetic heart valve: Secondary | ICD-10-CM

## 2023-12-03 DIAGNOSIS — Z951 Presence of aortocoronary bypass graft: Secondary | ICD-10-CM

## 2023-12-03 NOTE — Progress Notes (Signed)
     [address]       [name] [address]             [phone]       Patient: Home Provider: Office Consent for Telemedicine visit obtained.  Today's visit was completed via a real-time telehealth (see specific modality noted below). The patient/authorized person provided oral consent at the time of the visit to engage in a telemedicine encounter with the present provider at [facility]. The patient/authorized person was informed of the potential benefits, limitations, and risks of telemedicine. The patient/authorized person expressed understanding that the laws that protect confidentiality also apply to telemedicine. The patient/authorized person acknowledged understanding that telemedicine does not provide emergency services and that he or she would need to call 911 or proceed to the nearest hospital for help if such a need arose.   Total time spent in the clinical discussion 10 minutes.  Telehealth Modality: Phone visit (audio only)  I had a telephone visit with  Katelyn Cline who is s/p AVR CABG.  Overall doing well.  Pain is minimal.  Ambulating well. Vitals have been stable.  Katelyn Cline will see us  back in 1 month with a chest x-ray for cardiac rehab clearance.  [name] [name]

## 2023-12-05 ENCOUNTER — Other Ambulatory Visit: Payer: Self-pay | Admitting: Family Medicine

## 2023-12-05 DIAGNOSIS — I480 Paroxysmal atrial fibrillation: Secondary | ICD-10-CM

## 2023-12-05 NOTE — Progress Notes (Unsigned)
 Cardiology Office Note    Date:  12/05/2023  ID:  Katelyn Cline, DOB 05-25-1961, MRN [id number] PCP:  Christel Cousins, MD  Cardiologist:  Lauro Portal, MD  Electrophysiologist:  None   Chief Complaint: ***  History of Present Illness: .    Katelyn Cline is a 63 y.o. female with visit-pertinent history of CAD s/p CABG x 3 with LIMA to LAD, R SVG to PDA, OM1 along with aortic valve replacement with a 23 mm Inspiris valve and placement of 45 mm atrial clip on 11/03/2023 by Dr. Deloise Ferries, paroxysmal atrial fibrillation, COPD on supplemental oxygen , tobacco use, diabetes, hypertension and hyperlipidemia.  Patient was previously referred to Dr. Dean Every for atrial fibrillation.  This patient was previously in [place]  on vacation in [place] in September 2024 with increased shortness of breath.  She was evaluated [facility] and was told that she was in"heart failure" specifics of hospitalization unavailable.  Patient underwent DCCV with Dr. Alvis Ba in 08/2023 with successful conversion to sinus rhythm.  She had coronary calcium  score performed on 07/19/2023 which showed a total calcium  score of 1238 with calcium  distributed in all 3 coronary arteries.  2D echo revealed normal LV systolic function, mild concentric LVH with moderate aortic stenosis AVA.  Patient underwent cardiac PET on 09/14/2023 that was high risk, she underwent diagnostic cardiac catheterization with Dr. Addie Holstein that revealed three-vessel disease.  Patient has been scheduled to be evaluated by CT CS in 10/2023 however prior to this on 10/25/2023 patient presented to the emergency department with increased chest pain Hs troponin 34>>45, EKG with sinus rhythm, ST depression in inferior lateral leads, patient was noted to have a drop in hemoglobin and she reported tarry stools for the past few months.  Patient's anemia improved during hospitalization, did not undergo endoscopy while inpatient.  On 11/03/2023 patient underwent CABG x  3, AVR, LAA clipping and maze procedure, patient returned to the OR might have procedure with increased bleeding.  Patient was slow to wean from vent, felt to have acute on chronic diastolic CHF, AHF was consulted, started on IV diuresis with Lasix  with good response, patient was extubated on 11/07/2023.  Postop echo on 11/08/2023 indicated LVEF of 60 to 65%, no RWMA, mild LVH, grade 2 DD, RV fracture/volume overload, mildly reduced RV, moderate LAE, mild RA, trivial MR, moderate MAC, bioprosthetic aortic valve in place with mean Mildly elevated at 17 mmHg.  On 5/14 patient underwent left-sided thoracentesis which yielded 1 L of bloody fluid, patient remained hypervolemic on exam with continued diuresis post procedure.  CAD/moderate aortic stenosis: S/p CABG x 3 with bioprosthetic AVR and LAA clip on 11/03/2023.  HFpEF:Post op Echo 11/08/23 showed LVEF 60-65% , no RWMA, mild LVH, grade II DD, RV pressure/volume overload, mildly reduced RV, mod LAE, mild RAE, trivial MR, mod MAC, bioprosthetic aortic valve in place with mean gradient mildly elevated at 17 mmHg. No perivalvular regurgitation noted.  SGLT2 inhibitor? Today she appears  COPD/chronic O2 dependence:   Hypertension: Blood pressure today  HLD:   PAF: Patient with history of PAF, underwent DCCV in 08/2023.  Type 2 DM: Last hemoglobin A1C 5.1% during admission.   PAD with claudication: Bilateral lower extremity claudication with Dopplers performed on 09/22/2023 revealing right ABI 0.53 and a left 0.59 with bilateral SFA disease.  Plans for further management after coronary disease/valve disease addressed.    Tobacco use: Patient reports that she is currently smoking  Polycythemia vera: On Hydrea  1000 mg daily and  extra 500 mg on Monday Wednesday and Friday.  She is followed by heme-onc.   Labwork independently reviewed:   ROS: .   *** denies chest pain, shortness of breath, lower extremity edema, fatigue, palpitations, melena, hematuria,  hemoptysis, diaphoresis, weakness, presyncope, syncope, orthopnea, and PND.  All other systems are reviewed and otherwise negative.  Studies Reviewed: Aaron Aas    EKG:  EKG is ordered today, personally reviewed, demonstrating ***     CV Studies: Cardiac studies reviewed are outlined and summarized above. Otherwise please see EMR for full report. Cardiac Studies & Procedures   ______________________________________________________________________________________________ CARDIAC CATHETERIZATION  CARDIAC CATHETERIZATION 10/01/2023  Conclusion Images from the original result were not included.    Mid LM to Ost LAD lesion is 45% stenosed.   1st Diag lesion is 90% stenosed.   Mid LAD-1 lesion is 70% stenosed.   Mid LAD-2 lesion is 30% stenosed. Mid LAD to Dist LAD lesion is 70% stenosed. Dist LAD lesion is 40% stenosed.   Prox Cx lesion is 80% stenosed (essentially the ostium of the AV groove portion of the Cx).   1st Mrg lesion is 90% stenosed.   .   Dist Cx lesion is 60% stenosed just prior to 2nd Mrg & 3rd Mrg lesion is 80% stenosed.   Prox RCA to Dist RCA lesion is 100% stenosed.  Retrograde flow from septal collaterals fills the PDA with retrograde flow to the occlusion site.   LV end diastolic pressure is low.  Dominance: Right  POST-OPERATIVE DIAGNOSIS: Mean AoV gradient 19 mmHg with systemic hypertension Multivessel disease: Proximal RCA on percent CTO with the distal portion filling via left-to-right collaterals mostly the PDA.  (Cannot exclude codominant RCA) Distal left main eccentric calcified 40 proximately 50% stenosis of the vessel bifurcates into the LAD and LCx LCx bifurcates quickly into the AV groove and large OM1: OM1 has proximal 90% stenosis and is a large-caliber vessel Ostial AVG-LCx has 80% stenosis and gives rise to small caliber LPL 1(OM2 on image) and LPL 3 (on image LPL1) and large caliber LPL 2 (on image OM3).  60% stenosis just proximal to LPL 1 and then the LPL 2  continuation of the circumflex has 80% stenosis. LAD gives off a large caliber D1 that has a very proximal focal 90% stenosis before it bifurcates into 2 large branches.  There is diffuse disease throughout the vessel.  The LAD after D1 has a focal 70% stenosis.  Then in the mid vessel there is a bend the segment that starts with 30% followed by 70% and then 60%. Relatively normal right heart cath numbers with mean PAP of 18 mmHg and PCWP of 13 mmHg. Normal Cardiac Output and Index by Fick: 6.37-3.08.   RECOMMENDATIONS Discharge to home after PACU; Recommend outpatient CVTS consultation => patient has moderate MR on Echo and Multivessel Disease, best served with CABG/AVR; multiple lesions and likely incomplete revascularization if multivessel PCI is the option chosen.  Findings Coronary Findings Diagnostic  Dominance: Right  Left Main Vessel was injected. Vessel is large. There is moderate focal disease in the vessel. Mid LM to Ost LAD lesion is 45% stenosed.  Left Anterior Descending Vessel is large. Mid LAD-1 lesion is 70% stenosed. Mid LAD-2 lesion is 30% stenosed. Mid LAD to Dist LAD lesion is 70% stenosed. Dist LAD lesion is 40% stenosed.  First Diagonal Branch There is moderate disease in the vessel. 1st Diag lesion is 90% stenosed.  Lateral First Diagonal Branch Vessel is small in size.  Third Septal Branch Vessel is small in size.  Left Circumflex Vessel is large. Course is as the AV groove LCx Prox Cx lesion is 80% stenosed. Dist Cx lesion is 60% stenosed.  First Obtuse Marginal Branch Vessel is large in size. 1st Mrg lesion is 90% stenosed.  Second Obtuse Marginal Branch OM2 (2nd Mrg vs. LPL 1)  Third Obtuse Marginal Branch Vessel is large in size. 3rd Mrg lesion is 80% stenosed.  Right Coronary Artery Vessel was injected. Vessel is normal in caliber. There is severe diffuse disease throughout the vessel. There is severe focal disease in the vessel. Prox  RCA to Dist RCA lesion is 100% stenosed.  Right Ventricular Branch Vessel is small in size.  Right Posterior Descending Artery There is mild disease in the vessel. Collaterals RPDA filled by collaterals from 2nd Sept.  Collaterals RPDA filled by collaterals from 3rd Sept.  First Right Posterolateral Branch Vessel is small in size.  Intervention  No interventions have been documented.   STRESS TESTS  NM PET CT CARDIAC PERFUSION MULTI W/ABSOLUTE BLOODFLOW 09/14/2023  Narrative   The study is high risk.   The study is high risk due to severe reduction in myocardial blood flow in all coronary territories, suggesting multivessel disease.  Findings are also consistent with infarction with peri-infarct ischemia probably in the left circumflex territory.There is substantial interference with evaluation of the inferolateral wall from tracer uptake in the stomach and spleen.   Myocardial blood flow was computed to be 0.22ml/g/min at rest and 1.69ml/g/min at stress. Global myocardial blood flow reserve was 1.44 and was highly abnormal.   LV perfusion is abnormal. Defect 1: There is a small defect with mild reduction in uptake present in the mid to basal inferior and inferolateral location(s) that is partially reversible. There is normal wall motion in the defect area. Consistent with infarction and peri-infarct ischemia. The defect is consistent with abnormal perfusion in the LCx or RCA territories.   Rest left ventricular function is abnormal. Rest global function is mildly reduced. Rest EF: 48%. Stress left ventricular function is normal. Stress EF: 57%. End diastolic cavity size is mildly enlarged. End systolic cavity size is mildly enlarged.   Coronary calcium  was present on the attenuation correction CT images. Severe coronary calcifications were present. Coronary calcifications were present in the left anterior descending artery, left circumflex artery and right coronary artery  distribution(s).   There is heavy calcification in the aortic valve consistent with known aortic stenosis.   Electronically signed by Luana Rumple, MD  CLINICAL DATA:  This over-read does not include interpretation of cardiac or coronary anatomy or pathology. The Cardiac PET CT interpretation by the cardiologist is attached.  COMPARISON:  Coronary calcium  scoring, 07/25/2023, CT chest, 02/08/2023  FINDINGS: Cardiovascular: Aortic atherosclerosis. Aortic valve calcifications. Cardiomegaly. Three-vessel coronary artery calcifications. Enlargement of the main pulmonary artery measuring up to 3.8 cm in caliber. No pericardial effusion.  Limited Mediastinum/Nodes: No enlarged mediastinal, hilar, or axillary lymph nodes. Trachea and esophagus demonstrate no significant findings.  Limited Lungs/Pleura: Evidence of prior right upper lobe wedge resection. Mild diffuse bilateral bronchial wall thickening and mosaic attenuation of the airspaces. Unchanged benign nodules, largest in the dependent left lower lobe measuring 0.9 cm (series 4, image 58). No pleural effusion or pneumothorax.  Upper Abdomen: No acute abnormality.  Musculoskeletal: No chest wall abnormality. No acute osseous findings.  IMPRESSION: 1. Mild diffuse bilateral bronchial wall thickening and mosaic attenuation of the airspaces, consistent with nonspecific small airways disease.  2. Evidence of prior right upper lobe wedge resection. 3. Unchanged benign nodules, requiring no specific further follow-up or characterization. 4. Cardiomegaly and coronary artery disease. 5. Enlargement of the main pulmonary artery, as can be seen in pulmonary hypertension. 6. Aortic valve calcifications. Correlate for echocardiographic evidence of aortic valve dysfunction.  Aortic Atherosclerosis (ICD10-I70.0).   Electronically Signed By: Fredricka Jenny M.D. On: 09/14/2023 09:41   ECHOCARDIOGRAM  ECHOCARDIOGRAM COMPLETE  11/08/2023  Narrative ECHOCARDIOGRAM REPORT    Patient Name:   AZALIAH CARRERO Date of Exam: 11/08/2023 Medical Rec #:  [id number]        Height:       68.5 in Accession #:    [id number]       Weight:       221.6 lb Date of Birth:  Oct 06, 1960        BSA:          2.146 m Patient Age:    63 years         BP:           121/48 mmHg Patient Gender: F                HR:           62 bpm. Exam Location:  Inpatient  Procedure: 2D Echo, Cardiac Doppler and Color Doppler (Both Spectral and Color Flow Doppler were utilized during procedure).  Indications:    I50.40* Unspecified combined systolic (congestive) and diastolic (congestive) heart failure  History:        Patient has prior history of Echocardiogram examinations, most recent 07/19/2023. CAD, Abnormal ECG and Prior CABG, Aortic Valve Disease, Arrythmias:Atrial Fibrillation, Signs/Symptoms:Dyspnea; Risk Factors:Dyslipidemia, Diabetes and Current Smoker. AVR. Aortic Valve: 23 mm Edwards Inspiris Resilia valve is present in the aortic position.  Sonographer:    Raynelle Callow RDCS Referring Phys: [place] LEE   Sonographer Comments: Technically difficult study due to poor echo windows. Image acquisition challenging due to patient body habitus. IMPRESSIONS   1. Left ventricular ejection fraction, by estimation, is 60 to 65%. The left ventricle has normal function. The left ventricle has no regional wall motion abnormalities. There is mild concentric left ventricular hypertrophy. Left ventricular diastolic parameters are consistent with Grade II diastolic dysfunction (pseudonormalization). 2. D-shaped septum suggestive of RV pressure/volume overload. Right ventricular systolic function is mildly reduced. The right ventricular size is normal. Tricuspid regurgitation signal is inadequate for assessing PA pressure. 3. Left atrial size was moderately dilated. 4. Right atrial size was mildly dilated. 5. The mitral valve is normal in structure.  Trivial mitral valve regurgitation. No evidence of mitral stenosis. Moderate mitral annular calcification. 6. There was a bioprosthetic aortic valve. Mean gradient mildly elevated at 17 mmHg. No perivalvular regurgitation noted. 7. The inferior vena cava is dilated in size with >50% respiratory variability, suggesting right atrial pressure of 8 mmHg. 8. A small pericardial effusion is present.  FINDINGS Left Ventricle: Left ventricular ejection fraction, by estimation, is 60 to 65%. The left ventricle has normal function. The left ventricle has no regional wall motion abnormalities. The left ventricular internal cavity size was normal in size. There is mild concentric left ventricular hypertrophy. Left ventricular diastolic parameters are consistent with Grade II diastolic dysfunction (pseudonormalization).  Right Ventricle: D-shaped septum suggestive of RV pressure/volume overload. The right ventricular size is normal. No increase in right ventricular wall thickness. Right ventricular systolic function is mildly reduced. Tricuspid regurgitation signal is inadequate for assessing PA pressure.  Left Atrium:  Left atrial size was moderately dilated.  Right Atrium: Right atrial size was mildly dilated.  Pericardium: A small pericardial effusion is present.  Mitral Valve: The mitral valve is normal in structure. Moderate mitral annular calcification. Trivial mitral valve regurgitation. No evidence of mitral valve stenosis. MV peak gradient, 9.8 mmHg. The mean mitral valve gradient is 3.5 mmHg.  Tricuspid Valve: The tricuspid valve is normal in structure. Tricuspid valve regurgitation is trivial.  Aortic Valve: There was a bioprosthetic aortic valve. Mean gradient mildly elevated at 17 mmHg. No perivalvular regurgitation noted. The aortic valve has been repaired/replaced. Aortic valve regurgitation is not visualized. Aortic valve mean gradient measures 17.0 mmHg. Aortic valve peak gradient measures  25.7 mmHg. Aortic valve area, by VTI measures 2.22 cm. There is a 23 mm Edwards Inspiris Resilia valve present in the aortic position.  Pulmonic Valve: The pulmonic valve was normal in structure. Pulmonic valve regurgitation is trivial.  Aorta: The aortic root is normal in size and structure.  Venous: The inferior vena cava is dilated in size with greater than 50% respiratory variability, suggesting right atrial pressure of 8 mmHg.  IAS/Shunts: No atrial level shunt detected by color flow Doppler.   LEFT VENTRICLE PLAX 2D LVIDd:         4.90 cm      Diastology LVIDs:         3.10 cm      LV e' medial:    6.09 cm/s LV PW:         1.50 cm      LV E/e' medial:  25.0 LV IVS:        1.50 cm      LV e' lateral:   7.62 cm/s LVOT diam:     2.10 cm      LV E/e' lateral: 19.9 LV SV:         115 LV SV Index:   54 LVOT Area:     3.46 cm  LV Volumes (MOD) LV vol d, MOD A2C: 138.0 ml LV vol d, MOD A4C: 49.9 ml LV vol s, MOD A2C: 21.2 ml LV vol s, MOD A4C: 17.5 ml LV SV MOD A2C:     116.8 ml LV SV MOD A4C:     49.9 ml LV SV MOD BP:      69.0 ml  RIGHT VENTRICLE            IVC RV S prime:     4.68 cm/s  IVC diam: 2.10 cm TAPSE (M-mode): 0.8 cm  LEFT ATRIUM             Index        RIGHT ATRIUM           Index LA diam:        4.90 cm 2.28 cm/m   RA Area:     19.50 cm LA Vol (A2C):   94.7 ml 44.12 ml/m  RA Volume:   57.80 ml  26.93 ml/m LA Vol (A4C):   72.9 ml 33.97 ml/m LA Biplane Vol: 86.5 ml 40.30 ml/m AORTIC VALVE AV Area (Vmax):    1.97 cm AV Area (Vmean):   1.94 cm AV Area (VTI):     2.22 cm AV Vmax:           253.40 cm/s AV Vmean:          168.000 cm/s AV VTI:            0.520 m AV Peak Grad:  25.7 mmHg AV Mean Grad:      17.0 mmHg LVOT Vmax:         144.00 cm/s LVOT Vmean:        94.100 cm/s LVOT VTI:          0.333 m LVOT/AV VTI ratio: 0.64  AORTA Ao Root diam: 3.80 cm Ao Asc diam:  3.80 cm  MITRAL VALVE MV Area (PHT): 2.97 cm     SHUNTS MV Area VTI:    2.17 cm     Systemic VTI:  0.33 m MV Peak grad:  9.8 mmHg     Systemic Diam: 2.10 cm MV Mean grad:  3.5 mmHg MV Vmax:       1.56 m/s MV Vmean:      85.7 cm/s MV Decel Time: 256 msec MV E velocity: 152.00 cm/s MV A velocity: 113.00 cm/s MV E/A ratio:  1.35  Dalton McleanMD Electronically signed by Archer Bear Signature Date/Time: 11/08/2023/7:19:45 PM    Final   TEE  ECHO INTRAOPERATIVE TEE 11/03/2023  Narrative *INTRAOPERATIVE TRANSESOPHAGEAL REPORT *    Patient Name:   Myrtie Atkinson Date of Exam: 11/03/2023 Medical Rec #:  [id number]        Height:       68.5 in Accession #:    [id number]       Weight:       221.0 lb Date of Birth:  May 21, 1961        BSA:          2.14 m Patient Age:    63 years         BP:           134/56 mmHg Patient Gender: F                HR:           66 bpm. Exam Location:  Anesthesiology  Transesophogeal exam was perform intraoperatively during surgical procedure. Patient was closely monitored under general anesthesia during the entirety of examination.  Indications:     CAD, AS Performing Phys: [id number] HARRELL O LIGHTFOOT  Complications: No known complications during this procedure. POST-OP IMPRESSIONS _ Left Ventricle: has normal systolic function, with an ejection fraction of 55%. The cavity size was normal. The wall motion is normal. _ Right Ventricle: The right ventricle appears unchanged from pre-bypass. _ Aorta: The aorta appears unchanged from pre-bypass. _ Left Atrial Appendage: The left atrial appendage appears unchanged from pre-bypass. _ Aortic Valve: No stenosis present. A bioprosthetic valve was placed, leaflets thin and leaflets are freely mobile Manufactured by; Inspiris Size; 23mm. There is no regurgitation. The gradient recorded across the prosthetic valve is within the expected range. No perivalvular leak noted. _ Mitral Valve: The mitral valve appears unchanged from pre-bypass. There is mild regurgitation. _  Tricuspid Valve: There is trivial regurgitation. _ Pulmonic Valve: The pulmonic valve appears unchanged from pre-bypass. _ Interatrial Septum: The interatrial septum appears unchanged from pre-bypass. _ Pericardium: The pericardium appears unchanged from pre-bypass. _ Comments: Post-bypass images reviewed with surgeon.  PRE-OP FINDINGS Left Ventricle: The left ventricle has normal systolic function, with an ejection fraction of 55-60%. The cavity size was normal. There is mild concentric left ventricular hypertrophy.   Right Ventricle: The right ventricle has normal systolic function. The cavity was normal. There is no increase in right ventricular wall thickness.  Left Atrium: Left atrial size was dilated. No left atrial/left atrial appendage thrombus was detected. Left atrial appendage velocity is  normal at greater than 40 cm/s.  Right Atrium: Right atrial size was normal in size.  Interatrial Septum: No atrial level shunt detected by color flow Doppler.  Pericardium: There is no evidence of pericardial effusion.  Mitral Valve: Degenerative. Mitral valve regurgitation is trivial by color flow Doppler. There is No evidence of mitral stenosis. There is severe calcifcation present on the mitral annulus.  Tricuspid Valve: The tricuspid valve was normal in structure. Tricuspid valve regurgitation is trivial by color flow Doppler.  Aortic Valve: The aortic valve is tricuspid Aortic valve regurgitation was not visualized by color flow Doppler. There is moderate stenosis of the aortic valve. There is moderate calcification present. The non-coronary cusp appears immobile.  Pulmonic Valve: The pulmonic valve was normal in structure. Pulmonic valve regurgitation is trivial by color flow Doppler.   Aorta: The aortic root is normal in size and structure. There is evidence of plaque in the ascending aorta, aortic arch and descending aorta.  Pulmonary Artery: The pulmonary artery is of normal  size.  +-------------+---------++ AORTIC VALVE           +-------------+---------++ AV Mean Grad:21.0 mmHg +-------------+---------++   Gwenevere Lent MD Electronically signed by Gwenevere Lent MD Signature Date/Time: 11/03/2023/6:12:29 PM    Final  MONITORS  LONG TERM MONITOR (3-14 DAYS) 07/09/2023  Narrative Patch Wear Time:  13 days and 23 hours (2024-12-15T19:07:31-0500 to 2024-12-29T19:07:23-0500)  Atrial Fibrillation occurred continuously (100% burden), ranging from 46-165 bpm (avg of 92 bpm). Isolated VEs were occasional (1.2%, [id number]), VE Couplets were rare (<1.0%, 1320), and no VE Triplets were present. Ventricular Bigeminy was present.  Afib with 100% burden (rate 46-165)   CT SCANS  CT CARDIAC SCORING (SELF PAY ONLY) 07/19/2023  Addendum 07/25/2023 12:03 AM ADDENDUM REPORT: 07/25/2023 00:01  EXAM: OVER-READ INTERPRETATION  CT CHEST  The following report is an over-read performed by radiologist Dr. Chadwick Colonel of [facility] on 07/24/2023. This over-read does not include interpretation of cardiac or coronary anatomy or pathology. The coronary calcium  score interpretation by the cardiologist is attached.  COMPARISON:  Chest CT 02/08/2023  FINDINGS: Vascular: Aortic atherosclerosis. The included aorta is normal in caliber.  Mediastinum/nodes: No adenopathy or mass. Unremarkable esophagus.  Lungs: No focal airspace disease. Unchanged 9 mm pleural based left lower lobe nodule series 2, image 52. Unchanged 4 mm subpleural left upper lobe nodule series 2, image 8. These nodules demonstrate long-term stability and need no further imaging follow-up. Postsurgical change in the right upper lobe. No pleural fluid. The included airways are patent.  Upper abdomen: No acute or unexpected findings.  Musculoskeletal: There are no acute or suspicious osseous abnormalities.  IMPRESSION: 1.  Aortic Atherosclerosis (ICD10-I70.0). 2. Pulmonary  nodules that have been previously evaluated, and need no further imaging follow-up.   Electronically Signed By: Chadwick Colonel M.D. On: 07/25/2023 00:01  Narrative CLINICAL DATA:  Cardiovascular Disease Risk stratification  EXAM: Coronary Calcium  Score  TECHNIQUE: A gated, non-contrast computed tomography scan of the heart was performed using 3 mm slice thickness. Axial images were analyzed on a dedicated workstation. Calcium  scoring of the coronary arteries was performed using the Agatston method.  FINDINGS: Coronary arteries: Normal origins.  Coronary Calcium  Score:  Left main: 98.2  Left anterior descending artery: 276  Left circumflex artery: 597  Right coronary artery: 267  Total: 1238  Percentile: 99th  Pericardium: Normal.  Aorta: Normal caliber.  Aortic atherosclerosis.  Aortic leaflet and mitral annular calcification.  Dilated main pulmonary artery (38 mm,  non-contrast), suggestive of pulmonary hypertension.  Non-cardiac: See separate report from [facility] Radiology.  IMPRESSION: 1. Coronary calcium  score of 1238. This was 99th percentile for age-, race-, and sex-matched controls (MESA). 2. Aortic leaflet and mitral annular calcification. 3. Aortic atherosclerosis. 4. Dilated main pulmonary artery (38 mm, non-contrast), suggestive of pulmonary hypertension.  RECOMMENDATIONS: Coronary artery calcium  (CAC) score is a strong predictor of incident coronary heart disease (CHD) and provides predictive information beyond traditional risk factors. CAC scoring is reasonable to use in the decision to withhold, postpone, or initiate statin therapy in intermediate-risk or selected borderline-risk asymptomatic adults (age 42-75 years and LDL-C >=70 to <190 mg/dL) who do not have diabetes or established atherosclerotic cardiovascular disease (ASCVD).* In intermediate-risk (10-year ASCVD risk >=7.5% to <20%) adults or selected borderline-risk  (10-year ASCVD risk >=5% to <7.5%) adults in whom a CAC score is measured for the purpose of making a treatment decision the following recommendations have been made:  If CAC=0, it is reasonable to withhold statin therapy and reassess in 5 to 10 years, as long as higher risk conditions are absent (diabetes mellitus, family history of premature CHD in first degree relatives (males <55 years; females <65 years), cigarette smoking, or LDL >=190 mg/dL).  If CAC is 1 to 99, it is reasonable to initiate statin therapy for patients >=45 years of age.  If CAC is >=100 or >=75th percentile, it is reasonable to initiate statin therapy at any age.  Cardiology referral should be considered for patients with CAC scores >=400 or >=75th percentile.  *2018 AHA/ACC/AACVPR/AAPA/ABC/ACPM/ADA/AGS/APhA/ASPC/NLA/PCNA Guideline on the Management of Blood Cholesterol: A Report of the American College of Cardiology/American Heart Association Task Force on Clinical Practice Guidelines. J Am Coll Cardiol. 2019;73(24):3168-3209.  Dinah Franco, MD  Electronically Signed: By: Hazle Lites M.D. On: 07/19/2023 15:24     ______________________________________________________________________________________________       Current Reported Medications:.    No outpatient medications have been marked as taking for the 12/07/23 encounter (Appointment) with Jaquann Guarisco D, NP.    Physical Exam:    VS:  There were no vitals taken for this visit.   Wt Readings from Last 3 Encounters:  11/25/23 196 lb 6.9 oz (89.1 kg)  10/20/23 207 lb 6.4 oz (94.1 kg)  10/01/23 205 lb (93 kg)    GEN: Well nourished, well developed in no acute distress NECK: No JVD; No carotid bruits CARDIAC: ***RRR, no murmurs, rubs, gallops RESPIRATORY:  Clear to auscultation without rales, wheezing or rhonchi  ABDOMEN: Soft, non-tender, non-distended EXTREMITIES:  No edema; No acute deformity     Asessement and Plan:.      ***  {The patient has an active order for outpatient cardiac rehabilitation.   Please indicate if the patient is ready to start. Do NOT delete this.  It will auto delete.  Refresh note, then sign.              Click here to document readiness and see contraindications.  :1}  Cardiac Rehabilitation Eligibility Assessment      Disposition: F/u with ***  Signed, Sandip Power D Arek Spadafore, NP

## 2023-12-07 ENCOUNTER — Ambulatory Visit: Attending: Cardiology | Admitting: Cardiology

## 2023-12-07 ENCOUNTER — Encounter: Payer: Self-pay | Admitting: Cardiology

## 2023-12-07 VITALS — BP 122/72 | HR 64 | Ht 68.5 in | Wt 194.2 lb

## 2023-12-07 DIAGNOSIS — Z952 Presence of prosthetic heart valve: Secondary | ICD-10-CM | POA: Diagnosis not present

## 2023-12-07 DIAGNOSIS — Z951 Presence of aortocoronary bypass graft: Secondary | ICD-10-CM

## 2023-12-07 DIAGNOSIS — I2510 Atherosclerotic heart disease of native coronary artery without angina pectoris: Secondary | ICD-10-CM

## 2023-12-07 DIAGNOSIS — I10 Essential (primary) hypertension: Secondary | ICD-10-CM

## 2023-12-07 DIAGNOSIS — E782 Mixed hyperlipidemia: Secondary | ICD-10-CM

## 2023-12-07 DIAGNOSIS — I5032 Chronic diastolic (congestive) heart failure: Secondary | ICD-10-CM

## 2023-12-07 DIAGNOSIS — I480 Paroxysmal atrial fibrillation: Secondary | ICD-10-CM

## 2023-12-07 MED ORDER — ELIQUIS 5 MG PO TABS: 5.0000 mg | ORAL_TABLET | Freq: Two times a day (BID) | ORAL | 3 refills | Status: AC

## 2023-12-07 MED ORDER — ATORVASTATIN CALCIUM 80 MG PO TABS: 80.0000 mg | ORAL_TABLET | Freq: Every day | ORAL | 3 refills | Status: AC

## 2023-12-07 MED ORDER — FENOFIBRATE 145 MG PO TABS: 145.0000 mg | ORAL_TABLET | Freq: Every day | ORAL | 3 refills | Status: AC

## 2023-12-07 MED ORDER — EMPAGLIFLOZIN 10 MG PO TABS: 10.0000 mg | ORAL_TABLET | Freq: Every day | ORAL | 3 refills | Status: AC

## 2023-12-07 MED ORDER — EZETIMIBE 10 MG PO TABS
10.0000 mg | ORAL_TABLET | Freq: Every morning | ORAL | 3 refills | Status: AC
Start: 2023-12-07 — End: ?

## 2023-12-07 MED ORDER — LISINOPRIL 10 MG PO TABS: 10.0000 mg | ORAL_TABLET | Freq: Every day | ORAL | 3 refills | Status: AC

## 2023-12-07 MED ORDER — CEPHALEXIN 500 MG PO CAPS: ORAL_CAPSULE | ORAL | 4 refills | Status: AC

## 2023-12-07 MED ORDER — METOPROLOL TARTRATE 25 MG PO TABS: 12.5000 mg | ORAL_TABLET | Freq: Two times a day (BID) | ORAL | 1 refills | Status: DC

## 2023-12-07 MED ORDER — SPIRONOLACTONE 25 MG PO TABS
12.5000 mg | ORAL_TABLET | Freq: Every day | ORAL | 3 refills | Status: AC
Start: 2023-12-07 — End: ?

## 2023-12-07 NOTE — Patient Instructions (Signed)
 Medication Instructions:  No changes *If you need a refill on your cardiac medications before your next appointment, please call your pharmacy*  Lab Work: No labs If you have labs (blood work) drawn today and your tests are completely normal, you will receive your results only by: MyChart Message (if you have MyChart) OR A paper copy in the mail If you have any lab test that is abnormal or we need to change your treatment, we will call you to review the results.  Testing/Procedures: No testing  Follow-Up: At [facility], you and your health needs are our priority.  As part of our continuing mission to provide you with exceptional heart care, our providers are all part of one team.  This team includes your primary Cardiologist (physician) and Advanced Practice Providers or APPs (Physician Assistants and Nurse Practitioners) who all work together to provide you with the care you need, when you need it.  Your next appointment:   Already scheduled  We recommend signing up for the patient portal called "MyChart".  Sign up information is provided on this After Visit Summary.  MyChart is used to connect with patients for Virtual Visits (Telemedicine).  Patients are able to view lab/test results, encounter notes, upcoming appointments, etc.  Non-urgent messages can be sent to your provider as well.   To learn more about what you can do with MyChart, go to [email or web address].

## 2023-12-08 NOTE — Telephone Encounter (Signed)
-----   Message from Katlyn D West sent at 12/08/2023  7:48 AM EDT ----- Regarding: RE: Oxygen  use in Cardiac Rehab Good morning!  Yes, it is ok to maintain O2 saturation of 90% and above at rest and on exertion and ok to titrate oxygen  level to maintain.   Thank you, Katlyn West, NP ----- Message ----- From: Nickie Barnes, RN Sent: 12/07/2023   2:27 PM EDT To: Katlyn D West, NP Subject: Oxygen  use in Cardiac Rehab                    Hi Katlyn!  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 Medical history of COPD  with the use of oxygen  therapy.  Looks like her primary referred her to establish care with Pulmonology however that referral was closed as the pt did not contact them for scheduling.   For our cardiac patients we do ask for an ok to use oxygen  during exercise and to titrate to maintain the set  acceptable oxygen  saturation.   Typically for our cardiac pt's we maintain maintain O2 saturation of 90% and above at rest and on exertion.  If appropriate for this pt, ok to titrate oxygen  level to maintain this saturation?   Thanks so much for your response. This will serve as documentation and saved to the patient's chart.   Lettie Ray, BSN  Cardiac and Emergency planning/management officer

## 2023-12-10 ENCOUNTER — Ambulatory Visit: Admitting: Emergency Medicine

## 2023-12-20 ENCOUNTER — Other Ambulatory Visit: Payer: Self-pay | Admitting: Physician Assistant

## 2023-12-21 ENCOUNTER — Other Ambulatory Visit: Payer: Self-pay

## 2023-12-21 ENCOUNTER — Inpatient Hospital Stay (HOSPITAL_BASED_OUTPATIENT_CLINIC_OR_DEPARTMENT_OTHER): Admitting: Hematology

## 2023-12-21 ENCOUNTER — Inpatient Hospital Stay

## 2023-12-21 ENCOUNTER — Inpatient Hospital Stay: Attending: Hematology

## 2023-12-21 VITALS — BP 129/65 | HR 67 | Temp 97.3°F | Resp 18 | Wt 191.0 lb

## 2023-12-21 DIAGNOSIS — D45 Polycythemia vera: Secondary | ICD-10-CM

## 2023-12-21 DIAGNOSIS — D471 Chronic myeloproliferative disease: Secondary | ICD-10-CM | POA: Diagnosis not present

## 2023-12-21 DIAGNOSIS — Z79899 Other long term (current) drug therapy: Secondary | ICD-10-CM | POA: Diagnosis not present

## 2023-12-21 DIAGNOSIS — Z7901 Long term (current) use of anticoagulants: Secondary | ICD-10-CM | POA: Insufficient documentation

## 2023-12-21 LAB — CMP (CANCER CENTER ONLY)
ALT: 13 U/L (ref 0–44)
AST: 18 U/L (ref 15–41)
Albumin: 4 g/dL (ref 3.5–5.0)
Alkaline Phosphatase: 67 U/L (ref 38–126)
Anion gap: 7 (ref 5–15)
BUN: 28 mg/dL — ABNORMAL HIGH (ref 8–23)
CO2: 26 mmol/L (ref 22–32)
Calcium: 9.6 mg/dL (ref 8.9–10.3)
Chloride: 102 mmol/L (ref 98–111)
Creatinine: 0.67 mg/dL (ref 0.44–1.00)
GFR, Estimated: >60 mL/min (ref >60)
Glucose, Bld: 147 mg/dL — ABNORMAL HIGH (ref 70–99)
Potassium: 4.5 mmol/L (ref 3.5–5.1)
Sodium: 135 mmol/L (ref 135–145)
Total Bilirubin: 0.4 mg/dL (ref 0.0–1.2)
Total Protein: 6.9 g/dL (ref 6.5–8.1)

## 2023-12-21 LAB — CBC WITH DIFFERENTIAL (CANCER CENTER ONLY)
Abs Immature Granulocytes: 0.05 10*3/uL (ref 0.00–0.07)
Basophils Absolute: 0.1 10*3/uL (ref 0.0–0.1)
Basophils Relative: 2 %
Eosinophils Absolute: 0.2 10*3/uL (ref 0.0–0.5)
Eosinophils Relative: 2 %
HCT: 37 % (ref 36.0–46.0)
Hemoglobin: 11.4 g/dL — ABNORMAL LOW (ref 12.0–15.0)
Immature Granulocytes: 1 %
Lymphocytes Relative: 12 %
Lymphs Abs: 0.9 10*3/uL (ref 0.7–4.0)
MCH: 27.9 pg (ref 26.0–34.0)
MCHC: 30.8 g/dL (ref 30.0–36.0)
MCV: 90.5 fL (ref 80.0–100.0)
Monocytes Absolute: 0.2 10*3/uL (ref 0.1–1.0)
Monocytes Relative: 3 %
Neutro Abs: 6.2 10*3/uL (ref 1.7–7.7)
Neutrophils Relative %: 80 %
Platelet Count: 172 10*3/uL (ref 150–400)
RBC: 4.09 MIL/uL (ref 3.87–5.11)
RDW: 19.5 % — ABNORMAL HIGH (ref 11.5–15.5)
WBC Count: 7.7 10*3/uL (ref 4.0–10.5)
nRBC: 0 % (ref 0.0–0.2)

## 2023-12-21 LAB — IRON AND IRON BINDING CAPACITY (CC-WL,HP ONLY)
Iron: 49 ug/dL (ref 28–170)
Saturation Ratios: 9 % — ABNORMAL LOW (ref 10.4–31.8)
TIBC: 563 ug/dL — ABNORMAL HIGH (ref 250–450)
UIBC: 514 ug/dL — ABNORMAL HIGH (ref 148–442)

## 2023-12-21 LAB — FERRITIN: Ferritin: 37 ng/mL (ref 11–307)

## 2023-12-21 NOTE — Progress Notes (Signed)
 HEMATOLOGY/ONCOLOGY CLINIC NOTE  Date of Service: 12/27/23   Patient Care Team: Wendolyn Jenkins Jansky, MD as PCP - General (Family Medicine) Court Dorn PARAS, MD as PCP - Cardiology (Cardiology) Onesimo Emaline Brink, MD as Consulting Physician (Hematology) Nicholaus Sherlean CROME, [facility] (Inactive) as Pharmacist (Pharmacist) Cleatus Collar, MD as Consulting Physician (Ophthalmology) Court Dorn PARAS, MD as Consulting Physician (Cardiology)  CHIEF COMPLAINTS/PURPOSE OF CONSULTATION:  Evaluation and management of polycythemia vera  HISTORY OF PRESENTING ILLNESS:  Please see previous note for details of initial presentation  INTERVAL HISTORY:   Katelyn Cline is a 63 y.o. female here for continued evaluation and management of her polycythemia vera.   Patient was last seen by me on 09/13/2023 and was doing well overall.   She was hospital for cardiac issues and had cardiac bypass surgery on May 7th and had explorative surgery.   She presents in a wheelchair during today's visit.   Pt mentions being anemic during her recent hospital visit and notes that she had not been anemic previously. Patient reports mild bleeding from nosebleed after being on Omega-3 for a short time after surgery. She denies having any other bleeding issues at that time.   Patient reports previous persistent chest pain and notes being told having unstable angina. She has no chest pain at this time. She notes that she was hospitalized for 32 days for her significant coronary artery disease.   She reports some constipation issues while hospitalized.   Patient reports an episode of hallucinations while hospitalized and notes that she is unsure if her hallucinations occurred before or after surgery.   She reports having an X-ray of the lungs, and notes that fluid was removed behind the lung.   Patient reports leg swelling.  She notes fluctuating blood pressure.    She reports an upcoming echocardiogram on  Friday, 12/24/2023.   She continues to smoke at this time. Patient currently smokes a couple cigarettes daily and sometimes less. Patient notes that she previously quit smoking for 1.5 months, but has since restarted.   Patient reports having some difficulty with physical therapy in regards to standing. She also notes some difficulty standing from toilet seat, but notes recent purchase of electric lifting toilet seat, which helps her stand.   She reports some balance issues in the last few days and notes one fall incident. She denies any dizziness.   She mentions she is doing better overall regarding mobility status at home. Patient receives additional support at home from her 51 year old son.   Patient is noted to have a superficial area from her scar at this time, but this has generally healed.   Her hydroxyurea  was previously held during her hospitalization. She has since restarted and is currently taking 3 tablets of Hydroxyurea  500 MG on MWF, and two tablets on remaining days of the week.   She is currently on Eliquis  and Aspirin .   MEDICAL HISTORY:  Past Medical History:  Diagnosis Date   Agoraphobia    Anxiety    Arthritis    all joints   Chronic low back pain    Chronic respiratory failure with hypoxia ([name])    4L  via [place],  followed by pcp,   (09-16-2018  per pt only uses while at home and at night ,  portable oxygen  is not working)   Clotting disorder ([name]) 2013   Polycythemia vera   Colon polyps    COPD (chronic obstructive pulmonary disease) ([name])  DDD (degenerative disc disease), lumbosacral    Dental caries    DM type 2 (diabetes mellitus, type 2) ([name])    followed by pcp   Fibromyalgia    GERD (gastroesophageal reflux disease)    occasional , no meds   Heart murmur 2020   History of basal cell carcinoma (BCC) excision    s/p  Moh's of face/ nose 12/ 2013   History of palpitations    per pt found to have occaional PVCs   History of UTI    HTN (hypertension)     Hyperlipidemia    Loose, teeth    Major depression    Metastatic melanoma ([name]) 10/18/2012   12 mm posterior right upper lobe pulmonary nodule, max SUV 3.0  s/p  right wedge resection 10-21-2012,  followed by oncologist-- dr onesimo,  no recurrence   Mixed stress and urge urinary incontinence    Myeloproliferative neoplasm ([name])    Narcolepsy    Obesity    OSA (obstructive sleep apnea)    Oxygen  deficiency    Oxygen  dependent    4 L via [place],  per pt portable oxygen  not working but does use while at home and at night   Panic attacks    Periodontitis    Peripheral neuropathy    feet   Polycythemia vera(238.4) hematology/ oncology-- dr onesimo ([facility])   first dx 02014 due to smoker/copd--  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)  hx phlebotomies   Pulmonary nodules    bilateral small stable per CT 03-11-2018   Scoliosis    Sleep apnea    Smokers' cough ([name])    per pt productive a little in the morning's   Symptoms of upper respiratory infection (URI) 03/12/2018   Urinary (tract) obstruction    Wears glasses     SURGICAL HISTORY: Past Surgical History:  Procedure Laterality Date   ABDOMINAL HYSTERECTOMY  1998   partial   AORTIC VALVE REPLACEMENT N/A 11/03/2023   Procedure: REPLACEMENT, AORTIC VALVE, OPEN USING INSPIRIS RESILIA AORTIC VALVE;  Surgeon: Shyrl Linnie KIDD, MD;  Location: [facility] OR;  Service: Open Heart Surgery;  Laterality: N/A;   BREAST LUMPECTOMY Bilateral 1995   benign per pt   CARDIOVERSION N/A 09/06/2023   Procedure: CARDIOVERSION;  Surgeon: Francyne Headland, MD;  Location: [facility] INVASIVE CV LAB;  Service: Cardiovascular;  Laterality: N/A;   CLIPPING OF ATRIAL APPENDAGE N/A 11/03/2023   Procedure: CLIPPING, LEFT ATRIAL APPENDAGE USING ATRICURE ATRICLIP;  Surgeon: Shyrl Linnie KIDD, MD;  Location: [facility] OR;  Service: Open Heart Surgery;  Laterality: N/A;   COLONOSCOPY W/ BIOPSIES AND POLYPECTOMY     Hx: of   CORONARY ARTERY BYPASS  GRAFT N/A 11/03/2023   Procedure: CORONARY ARTERY BYPASS GRAFTING X 3, USING LEFT INTERNAL MAMMARY ARTERY AND RIGHT ENDOSCOPIC HARVESTED GREATER SAPHENOUS VEIN;  Surgeon: Shyrl Linnie KIDD, MD;  Location: [facility] OR;  Service: Open Heart Surgery;  Laterality: N/A;   CYSTECTOMY  2000   abdominal wall    DILATION AND CURETTAGE OF UTERUS  yrs ago   EXPLORATION POST OPERATIVE OPEN HEART N/A 11/03/2023   Procedure: EXPLORATION POST OPERATIVE OPEN HEART; WASHOUT;  Surgeon: Shyrl Linnie KIDD, MD;  Location: [facility] OR;  Service: Open Heart Surgery;  Laterality: N/A;   IR THORACENTESIS ASP PLEURAL SPACE W/IMG GUIDE  11/10/2023   IR THORACENTESIS ASP PLEURAL SPACE W/IMG GUIDE  11/23/2023   MOHS SURGERY  2013   nose/face   MULTIPLE EXTRACTIONS WITH ALVEOLOPLASTY N/A 09/19/2018  Procedure: Extraction of tooth #'s 2, 3, 6-14, 18, and 20-30 with alveoloplasty;  Surgeon: Cyndee Tanda FALCON, DDS;  Location: [facility] ORS;  Service: Oral Surgery;  Laterality: N/A;  GENERAL WITH NASAL TUBE   RIGHT/LEFT HEART CATH AND CORONARY ANGIOGRAPHY N/A 10/01/2023   Procedure: RIGHT/LEFT HEART CATH AND CORONARY ANGIOGRAPHY;  Surgeon: Anner Alm ORN, MD;  Location: [facility] INVASIVE CV LAB;  Service: Cardiovascular;  Laterality: N/A;   TEE WITHOUT CARDIOVERSION N/A 11/03/2023   Procedure: ECHOCARDIOGRAM, TRANSESOPHAGEAL;  Surgeon: Shyrl Linnie KIDD, MD;  Location: [facility] OR;  Service: Open Heart Surgery;  Laterality: N/A;   TEE WITHOUT CARDIOVERSION N/A 11/03/2023   Procedure: ECHOCARDIOGRAM, TRANSESOPHAGEAL;  Surgeon: Shyrl Linnie KIDD, MD;  Location: [facility] OR;  Service: Open Heart Surgery;  Laterality: N/A;   VIDEO ASSISTED THORACOSCOPY (VATS)/WEDGE RESECTION Right 10/21/2012   Procedure: VIDEO ASSISTED THORACOSCOPY (VATS)/WEDGE RESECTION;  Surgeon: Dallas KATHEE Jude, MD;  Location: [facility] OR;  Service: Thoracic;  Laterality: Right;   VIDEO BRONCHOSCOPY N/A 10/21/2012   Procedure: VIDEO BRONCHOSCOPY;  Surgeon: Dallas KATHEE Jude, MD;  Location: [facility] OR;   Service: Thoracic;  Laterality: N/A;    SOCIAL HISTORY: Social History   Socioeconomic History   Marital status: Divorced    Spouse name: Not on file   Number of children: 1   Years of education: 12+   Highest education level: Not on file  Occupational History   Occupation: disabled  Tobacco Use   Smoking status: Every Day    Current packs/day: 0.50    Average packs/day: 0.5 packs/day for 50.0 years (25.0 ttl pk-yrs)    Types: Cigarettes   Smokeless tobacco: Never   Tobacco comments:    Current smoker 10 cigarettes daily 06/10/23  Vaping Use   Vaping status: Former   Quit date: 09/15/2016  Substance and Sexual Activity   Alcohol  use: Yes    Alcohol /week: 1.0 standard drink of alcohol     Types: 1 Standard drinks or equivalent per week    Comment: Not weekly   Drug use: Never   Sexual activity: Not Currently    Birth control/protection: Surgical  Other Topics Concern   Not on file  Social History Narrative   Son-lives w/son.   disabled   Social Drivers of Corporate investment banker Strain: Low Risk  (12/24/2022)   Overall Financial Resource Strain (CARDIA)    Difficulty of Paying Living Expenses: Not hard at all  Food Insecurity: No Food Insecurity (10/25/2023)   Hunger Vital Sign    Worried About Running Out of Food in the Last Year: Never true    Ran Out of Food in the Last Year: Never true  Transportation Needs: No Transportation Needs (10/25/2023)   PRAPARE - Administrator, Civil Service (Medical): No    Lack of Transportation (Non-Medical): No  Physical Activity: Inactive (12/24/2022)   Exercise Vital Sign    Days of Exercise per Week: 0 days    Minutes of Exercise per Session: 0 min  Stress: Stress Concern Present (12/24/2022)   [employer] of Occupational Health - Occupational Stress Questionnaire    Feeling of Stress : Rather much  Social Connections: Socially Isolated (10/25/2023)   Social Connection and Isolation Panel    Frequency of  Communication with Friends and Family: Once a week    Frequency of Social Gatherings with Friends and Family: Once a week    Attends Religious Services: Never    Database administrator or Organizations: No  Attends Banker Meetings: Never    Marital Status: Divorced  Catering manager Violence: Not At Risk (10/25/2023)   Humiliation, Afraid, Rape, and Kick questionnaire    Fear of Current or Ex-Partner: No    Emotionally Abused: No    Physically Abused: No    Sexually Abused: No    FAMILY HISTORY: Family History  Problem Relation Age of Onset   Arthritis Mother    Hyperlipidemia Mother    Depression Mother    Anxiety disorder Mother    Dementia Mother    Hypertension Father    Hyperlipidemia Father    Heart disease Father    Stroke Father    Dementia Father    Heart disease Brother    ADD / ADHD Son    Alcohol  abuse Maternal Grandfather    Bipolar disorder Neg Hx    Drug abuse Neg Hx    OCD Neg Hx    Paranoid behavior Neg Hx    Schizophrenia Neg Hx    Seizures Neg Hx    Sexual abuse Neg Hx    Physical abuse Neg Hx     ALLERGIES:  is allergic to contrast media [iodinated contrast media], erythromycin, flagyl [metronidazole], other, penicillins, and tetracyclines & related.  MEDICATIONS:  Current Outpatient Medications  Medication Sig Dispense Refill   acetaminophen  (TYLENOL ) 325 MG tablet Take 2 tablets (650 mg total) by mouth every 6 (six) hours as needed for headache or mild pain (pain score 1-3).     albuterol  (VENTOLIN  HFA) 108 (90 Base) MCG/ACT inhaler Inhale 2 puffs into the lungs every 6 (six) hours as needed for wheezing or shortness of breath (as needed). 17 each 3   aspirin  EC 81 MG tablet Take 1 tablet (81 mg total) by mouth daily. Swallow whole. 100 tablet 4   atorvastatin  (LIPITOR) 80 MG tablet Take 1 tablet (80 mg total) by mouth at bedtime. 90 tablet 3   Blood Glucose Monitoring Suppl (BLOOD GLUCOSE SYSTEM PAK) KIT Please dispense based on  patient and insurance preference. Use as directed to monitor FSBS 2x daily. Dx: E11.9 1 kit 1   buPROPion  ER (WELLBUTRIN  SR) 100 MG 12 hr tablet Take 1 tablet by mouth every morning and take 1 tablet by mouth every day at bedtime. 180 tablet 0   cephALEXin  (KEFLEX ) 500 MG capsule Take four tablets 30-60 minutes prior to dental procedures. 4 capsule 4   DULoxetine  (CYMBALTA ) 60 MG capsule Take 1 capsule by mouth twice daily. Needs appointment. 180 capsule 0   ELIQUIS  5 MG TABS tablet Take 1 tablet (5 mg total) by mouth 2 (two) times daily. 180 tablet 3   empagliflozin  (JARDIANCE ) 10 MG TABS tablet Take 1 tablet (10 mg total) by mouth daily. 90 tablet 3   ezetimibe  (ZETIA ) 10 MG tablet Take 1 tablet (10 mg total) by mouth every morning. 90 tablet 3   fenofibrate  (TRICOR ) 145 MG tablet Take 1 tablet (145 mg total) by mouth at bedtime. 90 tablet 3   fish oil-omega-3 fatty acids  1000 MG capsule Take 2 capsules (2 g total) by mouth 2 (two) times daily. 180 capsule 1   furosemide  (LASIX ) 40 MG tablet Take 1 tablet (40 mg total) by mouth daily. 30 tablet 0   glucose blood (ACCU-CHEK GUIDE) test strip Use as instructed 100 each 12   Glucose Blood (BLOOD GLUCOSE TEST STRIPS) STRP Please dispense based on patient and insurance preference. Use as directed to monitor FSBS 2x daily. Dx: E11.9 100  strip 1   hydroxyurea  (HYDREA ) 500 MG capsule Take 2 capsules by mouth daily at bedtime with an additional 1 capsule on Monday and Thursday. (Patient taking differently: Take 1,000-1,500 mg by mouth See admin instructions. Take 2 capsules at daily at bedtime with 1 additional capsule on Mon, Wed, Fri nigtht) 90 capsule 2   lisinopril  (ZESTRIL ) 10 MG tablet Take 1 tablet (10 mg total) by mouth at bedtime. 90 tablet 3   metFORMIN  (GLUCOPHAGE ) 1000 MG tablet Take 1 tablet by mouth every morning and take 1 tablet by mouth every day at bedtime. 180 tablet 0   metoprolol  tartrate (LOPRESSOR ) 25 MG tablet Take 0.5 tablets (12.5  mg total) by mouth 2 (two) times daily. 30 tablet 1   Microlet Lancets MISC Please dispense based on patient and insurance preference. Use as directed to monitor FSBS 2x daily. Dx: E11.9 100 each 11   nystatin  (MYCOSTATIN /NYSTOP ) powder Apply 1 Application topically 3 (three) times daily. PA Case: [id number] Approved 06/30/2019- 06/28/2020 (Patient taking differently: Apply 1 Application topically 3 (three) times daily as needed (irritation). PA Case: [id number] Approved 06/30/2019- 06/28/2020) 60 g 2   nystatin  cream (MYCOSTATIN ) Apply topically twice daily. (Patient taking differently: Apply 1 Application topically 2 (two) times daily as needed for dry skin.) 60 g 1   omega-3 acid ethyl esters (LOVAZA ) 1 g capsule Take 1 capsule by mouth 2 (two) times daily.     oxyCODONE -acetaminophen  (PERCOCET) 10-325 MG tablet Take 1 tablet by mouth every 4 (four) hours as needed for severe pain (pain score 7-10).     senna (SENOKOT) 8.6 MG TABS tablet Take 2 tablets by mouth daily as needed for mild constipation.     spironolactone  (ALDACTONE ) 25 MG tablet Take 0.5 tablets (12.5 mg total) by mouth daily. 45 tablet 3   Vitamin D , Ergocalciferol , (DRISDOL ) 1.25 MG (50000 UNIT) CAPS capsule Take 1 capsule by mouth on Sundays at bedtime. 12 capsule 0   No current facility-administered medications for this visit.    REVIEW OF SYSTEMS:    10 Point review of Systems was done is negative except as noted above.   PHYSICAL EXAMINATION:  .BP 129/65   Pulse 67   Temp (!) 97.3 F (36.3 C)   Resp 18   Wt 191 lb (86.6 kg)   SpO2 95%   BMI 28.62 kg/m    GENERAL:alert, in no acute distress and comfortable SKIN: no acute rashes, no significant lesions EYES: conjunctiva are pink and non-injected, sclera anicteric OROPHARYNX: MMM, no exudates, no oropharyngeal erythema or ulceration NECK: supple, no JVD LYMPH:  no palpable lymphadenopathy in the cervical, axillary or inguinal regions LUNGS: clear to auscultation b/l  with normal respiratory effort HEART: regular rate & rhythm ABDOMEN:  normoactive bowel sounds , non tender, not distended. Extremity: no pedal edema PSYCH: alert & oriented x 3 with fluent speech NEURO: no focal motor/sensory deficits   LABORATORY DATA:  I have reviewed the data as listed  .    Latest Ref Rng & Units 12/21/2023   10:11 AM 11/24/2023    8:07 AM 11/22/2023    3:29 AM  CBC  WBC 4.0 - 10.5 K/uL 7.7  12.6  13.0   Hemoglobin 12.0 - 15.0 g/dL 88.5  89.7  9.9   Hematocrit 36.0 - 46.0 % 37.0  33.3  32.8   Platelets 150 - 400 K/uL 172  438  568    . CBC    Component Value Date/Time   WBC 7.7  12/21/2023 1011   WBC 12.6 (H) 11/24/2023 0807   RBC 4.09 12/21/2023 1011   HGB 11.4 (L) 12/21/2023 1011   HCT 37.0 12/21/2023 1011   PLT 172 12/21/2023 1011   MCV 90.5 12/21/2023 1011   MCH 27.9 12/21/2023 1011   MCHC 30.8 12/21/2023 1011   RDW 19.5 (H) 12/21/2023 1011   LYMPHSABS 0.9 12/21/2023 1011   MONOABS 0.2 12/21/2023 1011   EOSABS 0.2 12/21/2023 1011   BASOSABS 0.1 12/21/2023 1011     .    Latest Ref Rng & Units 12/21/2023   10:11 AM 11/24/2023    8:07 AM 11/22/2023    3:29 AM  CMP  Glucose 70 - 99 mg/dL 852  888  877   BUN 8 - 23 mg/dL 28  19  14    Creatinine 0.44 - 1.00 mg/dL 9.32  9.31  9.35   Sodium 135 - 145 mmol/L 135  139  139   Potassium 3.5 - 5.1 mmol/L 4.5  4.1  3.9   Chloride 98 - 111 mmol/L 102  102  98   CO2 22 - 32 mmol/L 26  30  31    Calcium  8.9 - 10.3 mg/dL 9.6  9.1  9.4   Total Protein 6.5 - 8.1 g/dL 6.9     Total Bilirubin 0.0 - 1.2 mg/dL 0.4     Alkaline Phos 38 - 126 U/L 67     AST 15 - 41 U/L 18     ALT 0 - 44 U/L 13      08/15/12 JAK2 Mutation:     03/15/18 BM Bx:      RADIOGRAPHIC STUDIES: I have personally reviewed the radiological images as listed and agreed with the findings in the report. No results found.   ASSESSMENT & PLAN:   63 y.o. female with  1. Jak2 V617F mutation positive Myeloproliferative syndrome.-  BM Bx consistent with Polycythemia Vera  JAK2 mutation identified in 08/15/12 labs  03/15/18 BM Bx revealed results consistent with JAK2 positive polycythemia vera  Labs done today show hemoglobin of 11.8, WBC count of 21.2k and platelets of 1k  2. H/o Medication/followup Non compliance  3. COPD - O2 dependant. Still smoking 1/2ppd  4. History of Metastatic melanoma to the right lung  S/p resection by Dr. Army on 10/21/12, BRAF mutation not detected. -continue q50monthly skin screening her PCP/dermatologist -symptom directed radiologic scanning.   5.  Past Medical History:  Diagnosis Date   Agoraphobia    Anxiety    Arthritis    all joints   Chronic low back pain    Chronic respiratory failure with hypoxia ([name])    4L  via [place],  followed by pcp,   (09-16-2018  per pt only uses while at home and at night ,  portable oxygen  is not working)   Clotting disorder ([name]) 2013   Polycythemia vera   Colon polyps    COPD (chronic obstructive pulmonary disease) ([name])    DDD (degenerative disc disease), lumbosacral    Dental caries    DM type 2 (diabetes mellitus, type 2) ([name])    followed by pcp   Fibromyalgia    GERD (gastroesophageal reflux disease)    occasional , no meds   Heart murmur 2020   History of basal cell carcinoma (BCC) excision    s/p  Moh's of face/ nose 12/ 2013   History of palpitations    per pt found to have occaional PVCs   History of UTI  HTN (hypertension)    Hyperlipidemia    Loose, teeth    Major depression    Metastatic melanoma ([name]) 10/18/2012   12 mm posterior right upper lobe pulmonary nodule, max SUV 3.0  s/p  right wedge resection 10-21-2012,  followed by oncologist-- dr onesimo,  no recurrence   Mixed stress and urge urinary incontinence    Myeloproliferative neoplasm ([name])    Narcolepsy    Obesity    OSA (obstructive sleep apnea)    Oxygen  deficiency    Oxygen  dependent    4 L via [place],  per pt portable oxygen  not working but does use while  at home and at night   Panic attacks    Periodontitis    Peripheral neuropathy    feet   Polycythemia vera(238.4) hematology/ oncology-- dr onesimo ([facility])   first dx 02014 due to smoker/copd--  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)  hx phlebotomies   Pulmonary nodules    bilateral small stable per CT 03-11-2018   Scoliosis    Sleep apnea    Smokers' cough ([name])    per pt productive a little in the morning's   Symptoms of upper respiratory infection (URI) 03/12/2018   Urinary (tract) obstruction    Wears glasses    PLAN:  -Discussed lab results on 12/21/23 in detail with patient. CBC showed WBC of 7.7K, hemoglobin of 11.4, and platelets of 172K. -Labs are normal -WBCs normal -platelets not elevated -hgb improved to 11.4 from 10.2 three weeks ago -there is no polycythemia at this time -there is no indication for phlebotomy at this time -her recent fall is most likely from loss of muscle mass -discussed that her hallucinations could possibly be from surgery or medications -she has tolerated her current dose of Hydroxyurea  500 MG three tablets on MWF, and two tablets on remaining days of the week well with no major toxicities -continue her current hydroxyurea  500 MG dose of three tablets on MWF, and two tablets on remaining days of the week.  -Continue Aspirin  -Continue Eliquis  from cardiologist standpoint -discussed that there can be some increased risk of bleeding with combination of Aspirin  and Eliquis  -encouraged smoking cessation. Discussed that this would also help her not be polycythemic.  -discussed options of nicotine  patch, nicotine  gum, or Lozenges  -discussed that nicotine  patches can also improve constipation -proceed with upcoming echocardiogram on Friday, 12/24/2023 with Cardiology -She shall return to clinic in 3 months -answered all of patient's questions in detail  FOLLOW UP: RTC with Dr onesimo with labs in 3 months with appointment  for therapeutic phlebotomy   The total time spent in the appointment was 30 minutes* .  All of the patient's questions were answered with apparent satisfaction. The patient knows to call the clinic with any problems, questions or concerns.   Emaline onesimo MD MS AAHIVMS [facility] Hematology/Oncology Physician [facility]  .*Total Encounter Time as defined by the Centers for Medicare and Medicaid Services includes, in addition to the face-to-face time of a patient visit (documented in the note above) non-face-to-face time: obtaining and reviewing outside history, ordering and reviewing medications, tests or procedures, care coordination (communications with other health care professionals or caregivers) and documentation in the medical record.    I,Mitra Faeizi,acting as a Neurosurgeon for Emaline onesimo, MD.,have documented all relevant documentation on the behalf of Emaline onesimo, MD,as directed by  Emaline onesimo, MD while in the presence of Emaline onesimo, MD.  .I have reviewed the  above documentation for accuracy and completeness, and I agree with the above. .Betrice Wanat Kishore Parthiv Mucci MD

## 2023-12-22 ENCOUNTER — Telehealth: Payer: Self-pay | Admitting: Cardiovascular Disease

## 2023-12-22 MED ORDER — FUROSEMIDE 40 MG PO TABS: 40.0000 mg | ORAL_TABLET | Freq: Every day | ORAL | 0 refills | Status: DC

## 2023-12-22 NOTE — Progress Notes (Signed)
 [address]               Thurmon BROCKS [place], [place] [address]                      [phone]  HPI: This is a 63 year old female who is s/p CABG X 3 (LIMA LAD, RSVG PDA, OM1), aortic valve replacement (using a 23mm Inspiris valve), placement of 45mm Atriclip,  and endoscopic greater saphenous vein harvest on the right by Dr. Shyrl on 11/03/2023. She returned to the OR for re exploration for bleeding later that evening. She was discharged on 11/25/2023. She had a virtual with Dr. Shyrl on 12/03/2023. She was recovering well at that time. She presents today for routine in person follow up. She states she has good days and occasional bad days. She states her bad days (2 episodes) involved esophageal spasms/belching.  Current Outpatient Medications  Medication Sig Dispense Refill   acetaminophen  (TYLENOL ) 325 MG tablet Take 2 tablets (650 mg total) by mouth every 6 (six) hours as needed for headache or mild pain (pain score 1-3).     albuterol  (VENTOLIN  HFA) 108 (90 Base) MCG/ACT inhaler Inhale 2 puffs into the lungs every 6 (six) hours as needed for wheezing or shortness of breath (as needed). 17 each 3   aspirin  EC 81 MG tablet Take 1 tablet (81 mg total) by mouth daily. Swallow whole. 100 tablet 4   atorvastatin  (LIPITOR) 80 MG tablet Take 1 tablet (80 mg total) by mouth at bedtime. 90 tablet 3   Blood Glucose Monitoring Suppl (BLOOD GLUCOSE SYSTEM PAK) KIT Please dispense based on patient and insurance preference. Use as directed to monitor FSBS 2x daily. Dx: E11.9 1 kit 1   buPROPion  ER (WELLBUTRIN  SR) 100 MG 12 hr tablet Take 1 tablet by mouth every morning and take 1 tablet by mouth every day at bedtime. 180 tablet 0   cephALEXin  (KEFLEX ) 500 MG capsule Take four tablets 30-60 minutes prior to dental procedures. 4 capsule 4   DULoxetine  (CYMBALTA ) 60 MG capsule Take 1 capsule by mouth twice daily. Needs appointment. 180 capsule 0   ELIQUIS  5 MG TABS tablet Take 1  tablet (5 mg total) by mouth 2 (two) times daily. 180 tablet 3   empagliflozin  (JARDIANCE ) 10 MG TABS tablet Take 1 tablet (10 mg total) by mouth daily. 90 tablet 3   ezetimibe  (ZETIA ) 10 MG tablet Take 1 tablet (10 mg total) by mouth every morning. 90 tablet 3   fenofibrate  (TRICOR ) 145 MG tablet Take 1 tablet (145 mg total) by mouth at bedtime. 90 tablet 3   fish oil-omega-3 fatty acids  1000 MG capsule Take 2 capsules (2 g total) by mouth 2 (two) times daily. 180 capsule 1   furosemide  (LASIX ) 40 MG tablet Take 1 tablet (40 mg total) by mouth daily. 30 tablet 0   glucose blood (ACCU-CHEK GUIDE) test strip Use as instructed 100 each 12   Glucose Blood (BLOOD GLUCOSE TEST STRIPS) STRP Please dispense based on patient and insurance preference. Use as directed to monitor FSBS 2x daily. Dx: E11.9 100 strip 1   hydroxyurea  (HYDREA ) 500 MG capsule Take 2 capsules by mouth daily at bedtime with an additional 1 capsule on Monday and Thursday. (Patient taking differently: Take 1,000-1,500 mg by mouth See admin instructions. Take 2 capsules at daily at bedtime with 1 additional capsule on  Mon, Wed, Fri nigtht) 90 capsule 2   lisinopril  (ZESTRIL ) 10 MG tablet Take 1 tablet (10 mg total) by mouth at bedtime. 90 tablet 3   metFORMIN  (GLUCOPHAGE ) 1000 MG tablet Take 1 tablet by mouth every morning and take 1 tablet by mouth every day at bedtime. 180 tablet 0   metoprolol  tartrate (LOPRESSOR ) 25 MG tablet Take 0.5 tablets (12.5 mg total) by mouth 2 (two) times daily. 30 tablet 1   Microlet Lancets MISC Please dispense based on patient and insurance preference. Use as directed to monitor FSBS 2x daily. Dx: E11.9 100 each 11   nystatin  (MYCOSTATIN /NYSTOP ) powder Apply 1 Application topically 3 (three) times daily. PA Case: [id number] Approved 06/30/2019- 06/28/2020 (Patient taking differently: Apply 1 Application topically 3 (three) times daily as needed (irritation). PA Case: [id number] Approved 06/30/2019- 06/28/2020) 60 g 2    nystatin  cream (MYCOSTATIN ) Apply topically twice daily. (Patient taking differently: Apply 1 Application topically 2 (two) times daily as needed for dry skin.) 60 g 1   omega-3 acid ethyl esters (LOVAZA ) 1 g capsule Take 1 capsule by mouth 2 (two) times daily.     oxyCODONE -acetaminophen  (PERCOCET) 10-325 MG tablet Take 1 tablet by mouth every 4 (four) hours as needed for severe pain (pain score 7-10).     senna (SENOKOT) 8.6 MG TABS tablet Take 2 tablets by mouth daily as needed for mild constipation.     spironolactone  (ALDACTONE ) 25 MG tablet Take 0.5 tablets (12.5 mg total) by mouth daily. 45 tablet 3   Vitamin D , Ergocalciferol , (DRISDOL ) 1.25 MG (50000 UNIT) CAPS capsule Take 1 capsule by mouth on Sundays at bedtime. 12 capsule 0   Vital Signs: Vitals:   01/05/24 0918  BP: 112/64  Pulse: 65  Resp: 20  SpO2: 93%   Physical Exam: CV-RRR, soft flow murmur Pulmonary-Clear to auscultation bilaterally Extremities-No LE edema Wounds-Clean and dry, no sign of infection  Diagnostic Tests: Narrative & Impression  CLINICAL DATA:  Status post aortic valve repair.   EXAM: CHEST - 2 VIEW   COMPARISON:  11/23/2023.   FINDINGS: Bilateral lung fields are clear. Bilateral costophrenic angles are clear.   Normal cardio-mediastinal silhouette.   Prosthetic aortic valve and left atrial appendage closure device noted.   There are surgical staples along the heart border and sternotomy wires, status post CABG (coronary artery bypass graft).   No acute osseous abnormalities.   The soft tissues are within normal limits.   IMPRESSION: *No active cardiopulmonary disease.     Electronically Signed   By: Ree Molt M.D.   On: 01/05/2024 09:24     Impression and Plan: We discussed the results of today's chest x ray. We also discussed driving (she has been taking Percocet for years and asked to avoid prior to driving), participation in cardiac rehab (she does not wish to  participate), and continuance of sternal precautions until 01/03/2024, and endocarditis prophylaxis. Unfortunately, she still is smoking (not as much as previously) and we talked about cessation.  She has already seen cardiology NP on 12/07/2023. She has an echocardiogram ordered for 01/10/2024. She will continue to be followed by cardiology (has appointment with Dr. Court at the end of the month) and hematology (history of polycythemia vera, has appointment in September). She will see [facility] PRN.  Kyla CHRISTELLA Donald, PA-C [facility] [phone]

## 2023-12-22 NOTE — Telephone Encounter (Signed)
 RX refilled

## 2023-12-22 NOTE — Telephone Encounter (Signed)
 Pt c/o medication issue:  1. Name of Medication: furosemide  (LASIX ) 40 MG tablet   2. How are you currently taking this medication (dosage and times per day)? 1 tablet daily  3. Are you having a reaction (difficulty breathing--STAT)? no  4. What is your medication issue? Pt  calling to get a refill on this medication. I told her I don't see it in her med list. If we do refill it please send to  [facility] [address] [place] [address] Phone: [phone]  Fax: [phone]

## 2023-12-24 ENCOUNTER — Telehealth: Payer: Self-pay | Admitting: Hematology

## 2023-12-24 ENCOUNTER — Ambulatory Visit (HOSPITAL_COMMUNITY)

## 2023-12-24 NOTE — Telephone Encounter (Signed)
Scheduled appointments with the patient

## 2023-12-27 ENCOUNTER — Encounter: Payer: Self-pay | Admitting: Hematology

## 2024-01-03 ENCOUNTER — Other Ambulatory Visit: Payer: Self-pay | Admitting: Cardiovascular Disease

## 2024-01-04 ENCOUNTER — Other Ambulatory Visit: Payer: Self-pay | Admitting: Thoracic Surgery (Cardiothoracic Vascular Surgery)

## 2024-01-04 ENCOUNTER — Ambulatory Visit (INDEPENDENT_AMBULATORY_CARE_PROVIDER_SITE_OTHER): Payer: Medicare HMO

## 2024-01-04 VITALS — Ht 68.0 in | Wt 185.0 lb

## 2024-01-04 DIAGNOSIS — Z0000 Encounter for general adult medical examination without abnormal findings: Secondary | ICD-10-CM | POA: Diagnosis not present

## 2024-01-04 DIAGNOSIS — E1140 Type 2 diabetes mellitus with diabetic neuropathy, unspecified: Secondary | ICD-10-CM | POA: Diagnosis not present

## 2024-01-04 DIAGNOSIS — Z748 Other problems related to care provider dependency: Secondary | ICD-10-CM | POA: Diagnosis not present

## 2024-01-04 DIAGNOSIS — Z952 Presence of prosthetic heart valve: Secondary | ICD-10-CM

## 2024-01-04 NOTE — Progress Notes (Addendum)
 Subjective:   Katelyn Cline is a 63 y.o. who presents for a Medicare Wellness preventive visit.  As a reminder, Annual Wellness Visits don't include a physical exam, and some assessments may be limited, especially if this visit is performed virtually. We may recommend an in-person follow-up visit with your provider if needed.  Visit Complete: Virtual I connected with  Katelyn Cline on 01/04/24 by a audio enabled telemedicine application and verified that I am speaking with the correct person using two identifiers.  Patient Location: Home  Provider Location: Office/Clinic  I discussed the limitations of evaluation and management by telemedicine. The patient expressed understanding and agreed to proceed.  Vital Signs: Because this visit was a virtual/telehealth visit, some criteria may be missing or patient reported. Any vitals not documented were not able to be obtained and vitals that have been documented are patient reported.  VideoDeclined- This patient declined Librarian, academic. Therefore the visit was completed with audio only.  Persons Participating in Visit: Patient.  AWV Questionnaire: Yes: Patient Medicare AWV questionnaire was completed by the patient on 01/02/24; I have confirmed that all information answered by patient is correct and no changes since this date.  Cardiac Risk Factors include: advanced age (>36men, >51 women);diabetes mellitus;dyslipidemia;hypertension;smoking/ tobacco exposure     Objective:    Today's Vitals   01/02/24 2155 01/04/24 1454  Weight:  185 lb (83.9 kg)  Height:  5' 8 (1.727 m)  PainSc: 7     Body mass index is 28.13 kg/m.     01/04/2024    3:11 PM 11/03/2023    6:59 AM 10/25/2023    8:00 PM 10/25/2023    4:57 AM 10/25/2023   12:30 AM 10/01/2023    8:53 AM 07/16/2023    2:46 PM  Advanced Directives  Does Patient Have a Medical Advance Directive? No No No No No No No  Would patient like information on  creating a medical advance directive? No - Patient declined No - Patient declined No - Patient declined No - Patient declined  Yes (Inpatient - patient defers creating a medical advance directive at this time - Information given)     Current Medications (verified) Outpatient Encounter Medications as of 01/04/2024  Medication Sig   acetaminophen  (TYLENOL ) 325 MG tablet Take 2 tablets (650 mg total) by mouth every 6 (six) hours as needed for headache or mild pain (pain score 1-3).   albuterol  (VENTOLIN  HFA) 108 (90 Base) MCG/ACT inhaler Inhale 2 puffs into the lungs every 6 (six) hours as needed for wheezing or shortness of breath (as needed).   aspirin  EC 81 MG tablet Take 1 tablet (81 mg total) by mouth daily. Swallow whole.   atorvastatin  (LIPITOR) 80 MG tablet Take 1 tablet (80 mg total) by mouth at bedtime.   buPROPion  ER (WELLBUTRIN  SR) 100 MG 12 hr tablet Take 1 tablet by mouth every morning and take 1 tablet by mouth every day at bedtime.   cephALEXin  (KEFLEX ) 500 MG capsule Take four tablets 30-60 minutes prior to dental procedures.   DULoxetine  (CYMBALTA ) 60 MG capsule Take 1 capsule by mouth twice daily. Needs appointment.   ELIQUIS  5 MG TABS tablet Take 1 tablet (5 mg total) by mouth 2 (two) times daily.   empagliflozin  (JARDIANCE ) 10 MG TABS tablet Take 1 tablet (10 mg total) by mouth daily.   ezetimibe  (ZETIA ) 10 MG tablet Take 1 tablet (10 mg total) by mouth every morning.   fenofibrate  (TRICOR ) 145  MG tablet Take 1 tablet (145 mg total) by mouth at bedtime.   fish oil-omega-3 fatty acids  1000 MG capsule Take 2 capsules (2 g total) by mouth 2 (two) times daily.   furosemide  (LASIX ) 40 MG tablet Take 1 tablet (40 mg total) by mouth daily.   glucose blood (ACCU-CHEK GUIDE) test strip Use as instructed   Glucose Blood (BLOOD GLUCOSE TEST STRIPS) STRP Please dispense based on patient and insurance preference. Use as directed to monitor FSBS 2x daily. Dx: E11.9   hydroxyurea  (HYDREA ) 500  MG capsule Take 2 capsules by mouth daily at bedtime with an additional 1 capsule on Monday and Thursday. (Patient taking differently: Take 1,000-1,500 mg by mouth See admin instructions. Take 2 capsules at daily at bedtime with 1 additional capsule on Mon, Wed, Fri nigtht)   lisinopril  (ZESTRIL ) 10 MG tablet Take 1 tablet (10 mg total) by mouth at bedtime.   metFORMIN  (GLUCOPHAGE ) 1000 MG tablet Take 1 tablet by mouth every morning and take 1 tablet by mouth every day at bedtime.   metoprolol  tartrate (LOPRESSOR ) 25 MG tablet Take 0.5 tablets (12.5 mg total) by mouth 2 (two) times daily.   nystatin  (MYCOSTATIN /NYSTOP ) powder Apply 1 Application topically 3 (three) times daily. PA Case: [id number] Approved 06/30/2019- 06/28/2020 (Patient taking differently: Apply 1 Application topically 3 (three) times daily as needed (irritation). PA Case: [id number] Approved 06/30/2019- 06/28/2020)   nystatin  cream (MYCOSTATIN ) Apply topically twice daily. (Patient taking differently: Apply 1 Application topically 2 (two) times daily as needed for dry skin.)   oxyCODONE -acetaminophen  (PERCOCET) 10-325 MG tablet Take 1 tablet by mouth every 4 (four) hours as needed for severe pain (pain score 7-10).   senna (SENOKOT) 8.6 MG TABS tablet Take 2 tablets by mouth daily as needed for mild constipation.   spironolactone  (ALDACTONE ) 25 MG tablet Take 0.5 tablets (12.5 mg total) by mouth daily.   Vitamin D , Ergocalciferol , (DRISDOL ) 1.25 MG (50000 UNIT) CAPS capsule Take 1 capsule by mouth on Sundays at bedtime.   Blood Glucose Monitoring Suppl (BLOOD GLUCOSE SYSTEM PAK) KIT Please dispense based on patient and insurance preference. Use as directed to monitor FSBS 2x daily. Dx: E11.9   Microlet Lancets MISC Please dispense based on patient and insurance preference. Use as directed to monitor FSBS 2x daily. Dx: E11.9   omega-3 acid ethyl esters (LOVAZA ) 1 g capsule Take 1 capsule by mouth 2 (two) times daily.   No  facility-administered encounter medications on file as of 01/04/2024.    Allergies (verified) Contrast media [iodinated contrast media], Erythromycin, Flagyl [metronidazole], Other, Penicillins, and Tetracyclines & related   History: Past Medical History:  Diagnosis Date   Agoraphobia    Anxiety    Arthritis    all joints   Chronic low back pain    Chronic respiratory failure with hypoxia ([name])    4L  via [place],  followed by pcp,   (09-16-2018  per pt only uses while at home and at night ,  portable oxygen  is not working)   Clotting disorder ([name]) 2013   Polycythemia vera   Colon polyps    COPD (chronic obstructive pulmonary disease) ([name])    DDD (degenerative disc disease), lumbosacral    Dental caries    DM type 2 (diabetes mellitus, type 2) ([name])    followed by pcp   Fibromyalgia    GERD (gastroesophageal reflux disease)    occasional , no meds   Heart murmur 2020   History of basal cell carcinoma (  BCC) excision    s/p  Moh's of face/ nose 12/ 2013   History of palpitations    per pt found to have occaional PVCs   History of UTI    HTN (hypertension)    Hyperlipidemia    Loose, teeth    Major depression    Metastatic melanoma ([name]) 10/18/2012   12 mm posterior right upper lobe pulmonary nodule, max SUV 3.0  s/p  right wedge resection 10-21-2012,  followed by oncologist-- dr onesimo,  no recurrence   Mixed stress and urge urinary incontinence    Myeloproliferative neoplasm ([name])    Narcolepsy    Obesity    OSA (obstructive sleep apnea)    Oxygen  deficiency    Oxygen  dependent    4 L via [place],  per pt portable oxygen  not working but does use while at home and at night   Panic attacks    Periodontitis    Peripheral neuropathy    feet   Polycythemia vera(238.4) hematology/ oncology-- dr onesimo ([facility])   first dx 02014 due to smoker/copd--  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)  hx phlebotomies   Pulmonary nodules    bilateral small stable  per CT 03-11-2018   Scoliosis    Sleep apnea    Smokers' cough ([name])    per pt productive a little in the morning's   Symptoms of upper respiratory infection (URI) 03/12/2018   Urinary (tract) obstruction    Wears glasses    Past Surgical History:  Procedure Laterality Date   ABDOMINAL HYSTERECTOMY  1998   partial   AORTIC VALVE REPLACEMENT N/A 11/03/2023   Procedure: REPLACEMENT, AORTIC VALVE, OPEN USING INSPIRIS RESILIA AORTIC VALVE;  Surgeon: Shyrl Linnie KIDD, MD;  Location: [facility] OR;  Service: Open Heart Surgery;  Laterality: N/A;   BREAST LUMPECTOMY Bilateral 1995   benign per pt   CARDIOVERSION N/A 09/06/2023   Procedure: CARDIOVERSION;  Surgeon: Francyne Headland, MD;  Location: [facility];  Service: Cardiovascular;  Laterality: N/A;   CLIPPING OF ATRIAL APPENDAGE N/A 11/03/2023   Procedure: CLIPPING, LEFT ATRIAL APPENDAGE USING ATRICURE ATRICLIP;  Surgeon: Shyrl Linnie KIDD, MD;  Location: [facility] OR;  Service: Open Heart Surgery;  Laterality: N/A;   COLONOSCOPY W/ BIOPSIES AND POLYPECTOMY     Hx: of   CORONARY ARTERY BYPASS GRAFT N/A 11/03/2023   Procedure: CORONARY ARTERY BYPASS GRAFTING X 3, USING LEFT INTERNAL MAMMARY ARTERY AND RIGHT ENDOSCOPIC HARVESTED GREATER SAPHENOUS VEIN;  Surgeon: Shyrl Linnie KIDD, MD;  Location: [facility];  Service: Open Heart Surgery;  Laterality: N/A;   CYSTECTOMY  2000   abdominal wall    DILATION AND CURETTAGE OF UTERUS  yrs ago   EXPLORATION POST OPERATIVE OPEN HEART N/A 11/03/2023   Procedure: EXPLORATION POST OPERATIVE OPEN HEART; WASHOUT;  Surgeon: Shyrl Linnie KIDD, MD;  Location: [facility];  Service: Open Heart Surgery;  Laterality: N/A;   IR THORACENTESIS ASP PLEURAL SPACE W/IMG GUIDE  11/10/2023   IR THORACENTESIS ASP PLEURAL SPACE W/IMG GUIDE  11/23/2023   MOHS SURGERY  2013   nose/face   MULTIPLE EXTRACTIONS WITH ALVEOLOPLASTY N/A 09/19/2018   Procedure: Extraction of tooth #'s 2, 3, 6-14, 18, and 20-30 with alveoloplasty;   Surgeon: Cyndee Tanda FALCON, DDS;  Location: [facility];  Service: Oral Surgery;  Laterality: N/A;  GENERAL WITH NASAL TUBE   RIGHT/LEFT HEART CATH AND CORONARY ANGIOGRAPHY N/A 10/01/2023   Procedure: RIGHT/LEFT HEART CATH AND CORONARY ANGIOGRAPHY;  Surgeon: Anner,  Alm ORN, MD;  Location: [facility];  Service: Cardiovascular;  Laterality: N/A;   TEE WITHOUT CARDIOVERSION N/A 11/03/2023   Procedure: ECHOCARDIOGRAM, TRANSESOPHAGEAL;  Surgeon: Shyrl Linnie KIDD, MD;  Location: [facility];  Service: Open Heart Surgery;  Laterality: N/A;   TEE WITHOUT CARDIOVERSION N/A 11/03/2023   Procedure: ECHOCARDIOGRAM, TRANSESOPHAGEAL;  Surgeon: Shyrl Linnie KIDD, MD;  Location: [facility];  Service: Open Heart Surgery;  Laterality: N/A;   VIDEO ASSISTED THORACOSCOPY (VATS)/WEDGE RESECTION Right 10/21/2012   Procedure: VIDEO ASSISTED THORACOSCOPY (VATS)/WEDGE RESECTION;  Surgeon: Dallas KATHEE Jude, MD;  Location: [facility] OR;  Service: Thoracic;  Laterality: Right;   VIDEO BRONCHOSCOPY N/A 10/21/2012   Procedure: VIDEO BRONCHOSCOPY;  Surgeon: [place] KATHEE Jude, MD;  [facility];  Service: Thoracic;  Laterality: N/A;   Family History  Problem Relation Age of Onset   Arthritis Mother    Hyperlipidemia Mother    Depression Mother    Anxiety disorder Mother    Dementia Mother    Hypertension Father    Hyperlipidemia Father    Heart disease Father    Stroke Father    Dementia Father    Heart disease Brother    ADD / ADHD Son    Alcohol  abuse Maternal Grandfather    Bipolar disorder Neg Hx    Drug abuse Neg Hx    OCD Neg Hx    Paranoid behavior Neg Hx    Schizophrenia Neg Hx    Seizures Neg Hx    Sexual abuse Neg Hx    Physical abuse Neg Hx    Social History   Socioeconomic History   Marital status: Divorced    Spouse name: Not on file   Number of children: 1   Years of education: 12+   Highest education level: Associate degree: occupational, Scientist, product/process development, or vocational program  Occupational History    Occupation: disabled  Tobacco Use   Smoking status: Every Day    Current packs/day: 0.50    Average packs/day: 0.5 packs/day for 50.0 years (25.0 ttl pk-yrs)    Types: Cigarettes   Smokeless tobacco: Never   Tobacco comments:    Current smoker 10 cigarettes daily 06/10/23  Vaping Use   Vaping status: Former   Quit date: 09/15/2016  Substance and Sexual Activity   Alcohol  use: Yes    Alcohol /week: 1.0 standard drink of alcohol     Types: 1 Standard drinks or equivalent per week    Comment: Not weekly   Drug use: Yes    Types: Oxycodone    Sexual activity: Not Currently    Birth control/protection: Surgical  Other Topics Concern   Not on file  Social History Narrative   Son-lives w/son.   disabled   Social Drivers of Corporate investment banker Strain: High Risk (01/02/2024)   Overall Financial Resource Strain (CARDIA)    Difficulty of Paying Living Expenses: Very hard  Food Insecurity: No Food Insecurity (01/02/2024)   Hunger Vital Sign    Worried About Running Out of Food in the Last Year: Never true    Ran Out of Food in the Last Year: Never true  Transportation Needs: Unmet Transportation Needs (01/02/2024)   PRAPARE - Administrator, Civil Service (Medical): Yes    Lack of Transportation (Non-Medical): Yes  Physical Activity: Insufficiently Active (01/02/2024)   Exercise Vital Sign    Days of Exercise per Week: 1 day    Minutes of Exercise per Session: 10 min  Stress: Stress Concern Present (  01/02/2024)   [employer] of Occupational Health - Occupational Stress Questionnaire    Feeling of Stress: Very much  Social Connections: Socially Isolated (01/02/2024)   Social Connection and Isolation Panel    Frequency of Communication with Friends and Family: Never    Frequency of Social Gatherings with Friends and Family: Never    Attends Religious Services: Never    Database administrator or Organizations: No    Attends Engineer, structural: Not on file     Marital Status: Divorced    Tobacco Counseling Ready to quit: Not Answered Counseling given: Not Answered Tobacco comments: Current smoker 10 cigarettes daily 06/10/23    Clinical Intake:  Pre-visit preparation completed: Yes  Pain : 0-10 Pain Score: 7  Pain Type: Chronic pain Pain Location: Generalized Pain Descriptors / Indicators: Aching, Stabbing, Sore Pain Onset: More than a month ago Pain Frequency: Constant     BMI - recorded: 28.13 Nutritional Status: BMI 25 -29 Overweight Diabetes: Yes CBG done?: No Did pt. bring in CBG monitor from home?: No  Lab Results  Component Value Date   HGBA1C 5.1 10/28/2023   HGBA1C 5.8 11/17/2022   HGBA1C 6.4 05/18/2022     How often do you need to have someone help you when you read instructions, pamphlets, or other written materials from your doctor or pharmacy?: 1 - Never  Interpreter Needed?: No  Information entered by :: Ellouise Haws, LPN   Activities of Daily Living     01/02/2024    9:55 PM 10/25/2023    8:00 PM  In your present state of health, do you have any difficulty performing the following activities:  Hearing? 1 0  Comment HOH   Vision? 0 0  Difficulty concentrating or making decisions? 1 0  Walking or climbing stairs? 1   Dressing or bathing? 1   Doing errands, shopping? 1 0  Preparing Food and eating ? N   Using the Toilet? Y   In the past six months, have you accidently leaked urine? Y   Comment wears a brief   Do you have problems with loss of bowel control? Y   Managing your Medications? Y   Comment son assist   Managing your Finances? N   Housekeeping or managing your Housekeeping? N     Patient Care Team: Wendolyn Jenkins Jansky, MD as PCP - General (Family Medicine) Court Dorn PARAS, MD as PCP - Cardiology (Cardiology) Onesimo Emaline Brink, MD as Consulting Physician (Hematology) Nicholaus Sherlean CROME, [facility] (Inactive) as Pharmacist (Pharmacist) Cleatus Collar, MD as Consulting Physician  (Ophthalmology) Court Dorn PARAS, MD as Consulting Physician (Cardiology)  I have updated your Care Teams any recent Medical Services you may have received from other providers in the past year.     Assessment:   This is a routine wellness examination for Katelyn Cline.  Hearing/Vision screen Hearing Screening - Comments:: Pt HOH  Vision Screening - Comments:: Wears rx glasses - up to date with routine eye exams with [facility]    Goals Addressed             This Visit's Progress    Patient Stated       Getting healthier        Depression Screen     01/04/2024    3:04 PM 07/16/2023   11:28 AM 04/07/2023   11:03 AM 12/24/2022    2:35 PM 11/17/2022    9:38 AM 05/18/2022    9:38 AM 12/26/2021  3:07 PM  PHQ 2/9 Scores  PHQ - 2 Score 2 5 4 4 4 4 3   PHQ- 9 Score 10 23 11 4 18 19 15     Fall Risk     01/02/2024    9:55 PM 12/24/2022    2:37 PM 12/26/2021    3:11 PM 11/14/2021    9:23 AM 06/18/2020   10:46 AM  Fall Risk   Falls in the past year? 1 0 1 0 0  Number falls in past yr: 1 0 1 0   Injury with Fall? 0 0 0 0   Risk for fall due to : Impaired balance/gait;Impaired mobility;History of fall(s) Impaired vision;Impaired balance/gait;Impaired mobility Impaired vision;Impaired mobility;Impaired balance/gait Impaired balance/gait;Other (Comment) Impaired mobility;Impaired balance/gait  Risk for fall due to: Comment    USES A CANE   Follow up Falls prevention discussed Falls prevention discussed Falls prevention discussed   Falls evaluation completed      Data saved with a previous flowsheet row definition    MEDICARE RISK AT HOME:  Medicare Risk at Home Any stairs in or around the home?: (Patient-Rptd) Yes If so, are there any without handrails?: (Patient-Rptd) No Home free of loose throw rugs in walkways, pet beds, electrical cords, etc?: (Patient-Rptd) Yes Adequate lighting in your home to reduce risk of falls?: (Patient-Rptd) Yes Life alert?: (Patient-Rptd) No Use of a  cane, walker or w/c?: (Patient-Rptd) Yes Grab bars in the bathroom?: (Patient-Rptd) No Shower chair or bench in shower?: (Patient-Rptd) Yes Elevated toilet seat or a handicapped toilet?: (Patient-Rptd) Yes  TIMED UP AND GO:  Was the test performed?  No  Cognitive Function: 6CIT completed        01/04/2024    3:13 PM 12/24/2022    2:39 PM 12/26/2021    3:15 PM  6CIT Screen  What Year? 0 points 0 points 0 points  What month? 0 points 0 points 0 points  What time? 0 points 0 points 0 points  Count back from 20 0 points 0 points 0 points  Months in reverse 0 points 0 points 0 points  Repeat phrase 0 points 0 points 0 points  Total Score 0 points 0 points 0 points    Immunizations Immunization History  Administered Date(s) Administered   Hepatitis B, ADULT 06/29/2001   Hepatitis B, PED/ADOLESCENT 06/29/2001   Influenza, Seasonal, Injecte, Preservative Fre 04/07/2023   Influenza,inj,Quad PF,6+ Mos 04/07/2013, 05/18/2014, 05/28/2015, 05/13/2016, 06/11/2017, 07/18/2018, 06/16/2019, 06/18/2020, 05/18/2022   Influenza-Unspecified 04/07/2013   Moderna Sars-Covid-2 Vaccination 11/20/2019, 12/19/2019   PNEUMOCOCCAL CONJUGATE-20 04/07/2023   Pneumococcal Polysaccharide-23 12/10/2011, 10/25/2013   Td 06/29/2001   Td (Adult), 2 Lf Tetanus Toxid, Preservative Free 06/29/2001   Tdap 12/10/2011   Zoster Recombinant(Shingrix ) 11/14/2021    Screening Tests Health Maintenance  Topic Date Due   Hepatitis B Vaccines (2 of 3 - 19+ 3-dose series) 07/27/2001   Colonoscopy  Never done   MAMMOGRAM  04/21/2019   COVID-19 Vaccine (3 - Moderna risk series) 01/16/2020   Diabetic kidney evaluation - Urine ACR  06/18/2021   DTaP/Tdap/Td (4 - Td or Tdap) 12/09/2021   Zoster Vaccines- Shingrix  (2 of 2) 01/09/2022   FOOT EXAM  05/19/2023   DEXA SCAN  Never done   INFLUENZA VACCINE  01/28/2024   Lung Cancer Screening  02/08/2024   OPHTHALMOLOGY EXAM  03/14/2024   HEMOGLOBIN A1C  04/29/2024    Diabetic kidney evaluation - eGFR measurement  12/20/2024   Medicare Annual Wellness (AWV)  01/03/2025  Pneumococcal Vaccine: 50+ Years  Completed   Hepatitis C Screening  Completed   HIV Screening  Completed   HPV VACCINES  Aged Out   Meningococcal B Vaccine  Aged Out    Health Maintenance  Health Maintenance Due  Topic Date Due   Hepatitis B Vaccines (2 of 3 - 19+ 3-dose series) 07/27/2001   Colonoscopy  Never done   MAMMOGRAM  04/21/2019   COVID-19 Vaccine (3 - Moderna risk series) 01/16/2020   Diabetic kidney evaluation - Urine ACR  06/18/2021   DTaP/Tdap/Td (4 - Td or Tdap) 12/09/2021   Zoster Vaccines- Shingrix  (2 of 2) 01/09/2022   FOOT EXAM  05/19/2023   DEXA SCAN  Never done   Health Maintenance Items Addressed: See Nurse Notes at the end of this note  Additional Screening:  Vision Screening: Recommended annual ophthalmology exams for early detection of glaucoma and other disorders of the eye. Would you like a referral to an eye doctor? Yes    Dental Screening: Recommended annual dental exams for proper oral hygiene  Community Resource Referral / Chronic Care Management: CRR required this visit?  No   CCM required this visit?  No   Plan:    I have personally reviewed and noted the following in the patient's chart:   Medical and social history Use of alcohol , tobacco or illicit drugs  Current medications and supplements including opioid prescriptions. Patient is currently taking opioid prescriptions. Information provided to patient regarding non-opioid alternatives. Patient advised to discuss non-opioid treatment plan with their provider. Functional ability and status Nutritional status Physical activity Advanced directives List of other physicians Hospitalizations, surgeries, and ER visits in previous 12 months Vitals Screenings to include cognitive, depression, and falls Referrals and appointments  In addition, I have reviewed and discussed with  patient certain preventive protocols, quality metrics, and best practice recommendations. A written personalized care plan for preventive services as well as general preventive health recommendations were provided to patient.   Ellouise VEAR Haws, LPN   07/30/7972   After Visit Summary: (MyChart) Due to this being a telephonic visit, the after visit summary with patients personalized plan was offered to patient via MyChart   Notes: PCP Follow Up Recommendations: Pt has declined mammogram,dexa scan and colonoscopy at this time. Pt has not been seen in office stating she has transportation concerns a referral has also been place. Pt also stated she was on Cardizem  unsure of the dosage.

## 2024-01-04 NOTE — Patient Instructions (Addendum)
 [name] , Thank you for taking time out of your busy schedule to complete your Annual Wellness Visit with me. I enjoyed our conversation and look forward to speaking with you again next year. I, as well as your care team,  appreciate your ongoing commitment to your health goals. Please review the following plan we discussed and let me know if I can assist you in the future. Your Game plan/ To Do List    Referrals: If you haven't heard from the office you've been referred to, please reach out to them at the phone provided.   Follow up Visits: Next Medicare AWV with our clinical staff: 01/09/25   Have you seen your provider in the last 6 months (3 months if uncontrolled diabetes)? No Next Office Visit with your provider: will make appt needs transportation assistance   Clinician Recommendations:  Each day, aim for 6 glasses of water , plenty of protein in your diet and try to get up and walk/ stretch every hour for 5-10 minutes at a time.        This is a list of the screening recommended for you and due dates:  Health Maintenance  Topic Date Due   Hepatitis B Vaccine (2 of 3 - 19+ 3-dose series) 07/27/2001   Colon Cancer Screening  Never done   Mammogram  04/21/2019   COVID-19 Vaccine (3 - Moderna risk series) 01/16/2020   Yearly kidney health urinalysis for diabetes  06/18/2021   DTaP/Tdap/Td vaccine (4 - Td or Tdap) 12/09/2021   Zoster (Shingles) Vaccine (2 of 2) 01/09/2022   Complete foot exam   05/19/2023   DEXA scan (bone density measurement)  Never done   Flu Shot  01/28/2024   Screening for Lung Cancer  02/08/2024   Eye exam for diabetics  03/14/2024   Hemoglobin A1C  04/29/2024   Yearly kidney function blood test for diabetes  12/20/2024   Medicare Annual Wellness Visit  01/03/2025   Pneumococcal Vaccine for age over 12  Completed   Hepatitis C Screening  Completed   HIV Screening  Completed   HPV Vaccine  Aged Out   Meningitis B Vaccine  Aged Out    Advanced  directives: (Declined) Advance directive discussed with you today. Even though you declined this today, please call our office should you change your mind, and we can give you the proper paperwork for you to fill out. Advance Care Planning is important because it:  [x]  Makes sure you receive the medical care that is consistent with your values, goals, and preferences  [x]  It provides guidance to your family and loved ones and reduces their decisional burden about whether or not they are making the right decisions based on your wishes.  Follow the link provided in your after visit summary or read over the paperwork we have mailed to you to help you started getting your Advance Directives in place. If you need assistance in completing these, please reach out to us  so that we can help you!  If you wish to quit smoking, help is available. For free tobacco cessation program offerings call the [facility] at [phone] or Live Well Line at 743-731-6815. You may also visit [email or web address] or email [email or web address] for more information on other programs.   You may also call 1-800-QUIT-NOW (519-767-2686) or visit www.[facility].ch or www.[email or web address] for additional resources on smoking cessation.    See attachments for Preventive Care and Fall Prevention Tips.

## 2024-01-05 ENCOUNTER — Encounter: Payer: Self-pay | Admitting: Physician Assistant

## 2024-01-05 ENCOUNTER — Ambulatory Visit (HOSPITAL_COMMUNITY)
Admission: RE | Admit: 2024-01-05 | Discharge: 2024-01-05 | Disposition: A | Discharge status: Home / Self Care | Source: Ambulatory Visit | Attending: Cardiovascular Disease | Admitting: Cardiovascular Disease

## 2024-01-05 ENCOUNTER — Ambulatory Visit: Payer: Self-pay | Attending: Thoracic Surgery (Cardiothoracic Vascular Surgery) | Admitting: Physician Assistant

## 2024-01-05 VITALS — BP 112/64 | HR 65 | Resp 20 | Ht 68.0 in | Wt 187.0 lb

## 2024-01-05 DIAGNOSIS — Z952 Presence of prosthetic heart valve: Secondary | ICD-10-CM | POA: Insufficient documentation

## 2024-01-05 NOTE — Patient Instructions (Signed)
 You are encouraged to enroll and participate in the outpatient cardiac rehab program beginning as soon as practical.  You may return to driving an automobile as long as you are no longer requiring oral narcotic pain relievers during the daytime.  It would be wise to start driving only short distances during the daylight and gradually increase from there as you feel comfortable.  Continue to avoid any heavy lifting or strenuous use of your arms or shoulders for at least a total of three months from the time of surgery.  After three months you may gradually increase how much you lift or otherwise use your arms or chest as tolerated, with limits based upon whether or not activities lead to the return of significant discomfort.  Endocarditis is a potentially serious infection of heart valves or inside lining of the heart.  It occurs more commonly in patients with diseased heart valves (such as patient's with aortic or mitral valve disease) and in patients who have undergone heart valve repair or replacement.  Certain surgical and dental procedures may put you at risk, such as dental cleaning, other dental procedures, or any surgery involving the respiratory, urinary, gastrointestinal tract, gallbladder or prostate gland.   To minimize your chances for develooping endocarditis, maintain good oral health and seek prompt medical attention for any infections involving the mouth, teeth, gums, skin or urinary tract.    Always notify your doctor or dentist about your underlying heart valve condition before having any invasive procedures. You will need to take antibiotics before certain procedures, including all routine dental cleanings or other dental procedures.  Your cardiologist or dentist should prescribe these antibiotics for you to be taken ahead of time.

## 2024-01-06 ENCOUNTER — Telehealth: Payer: Self-pay

## 2024-01-06 NOTE — Progress Notes (Signed)
   Telephone encounter was:  Successful.  Complex Care Management Note Care Guide Note  01/06/2024 Name: Katelyn Cline MRN: [id number] DOB: Nov 25, 1960  Katelyn Cline is a 63 y.o. year old female who is a primary care patient of Wendolyn Jenkins Jansky, MD . The community resource team was consulted for assistance with Transportation Needs   SDOH screenings and interventions completed:  Yes        Care guide performed the following interventions: Patient provided with information about care guide support team and interviewed to confirm resource needs.Pt has no need at this time and has transportation resources for [facility]. Pt will follow up with [employer] to see if her benefits include transportation as well   Follow Up Plan:  No further follow up planned at this time. The patient has been provided with needed resources.  Encounter Outcome:  Patient Visit Completed    [facility]  [facility] Guide, Phone: [phone] Fax: [phone] Website: [email or web address]

## 2024-01-10 ENCOUNTER — Telehealth: Payer: Self-pay | Admitting: Cardiovascular Disease

## 2024-01-10 ENCOUNTER — Ambulatory Visit (HOSPITAL_COMMUNITY)
Admission: RE | Admit: 2024-01-10 | Discharge: 2024-01-10 | Disposition: A | Discharge status: Home / Self Care | Source: Ambulatory Visit | Attending: Cardiovascular Disease | Admitting: Cardiovascular Disease

## 2024-01-10 DIAGNOSIS — Z951 Presence of aortocoronary bypass graft: Secondary | ICD-10-CM | POA: Insufficient documentation

## 2024-01-10 DIAGNOSIS — I25119 Atherosclerotic heart disease of native coronary artery with unspecified angina pectoris: Secondary | ICD-10-CM | POA: Diagnosis not present

## 2024-01-10 DIAGNOSIS — I493 Ventricular premature depolarization: Secondary | ICD-10-CM | POA: Diagnosis not present

## 2024-01-10 DIAGNOSIS — F17200 Nicotine dependence, unspecified, uncomplicated: Secondary | ICD-10-CM | POA: Diagnosis not present

## 2024-01-10 DIAGNOSIS — I3481 Nonrheumatic mitral (valve) annulus calcification: Secondary | ICD-10-CM | POA: Insufficient documentation

## 2024-01-10 DIAGNOSIS — E1151 Type 2 diabetes mellitus with diabetic peripheral angiopathy without gangrene: Secondary | ICD-10-CM | POA: Diagnosis not present

## 2024-01-10 DIAGNOSIS — I10 Essential (primary) hypertension: Secondary | ICD-10-CM | POA: Insufficient documentation

## 2024-01-10 DIAGNOSIS — Z952 Presence of prosthetic heart valve: Secondary | ICD-10-CM | POA: Diagnosis present

## 2024-01-10 DIAGNOSIS — I4891 Unspecified atrial fibrillation: Secondary | ICD-10-CM | POA: Diagnosis not present

## 2024-01-10 DIAGNOSIS — E785 Hyperlipidemia, unspecified: Secondary | ICD-10-CM | POA: Diagnosis not present

## 2024-01-10 NOTE — Telephone Encounter (Signed)
 Pt contacted and advised to take Keflex  before dental procedures, not echo.

## 2024-01-10 NOTE — Telephone Encounter (Signed)
 Pt c/o medication issue:  1. Name of Medication:  Cephalexin  500 MG  2. How are you currently taking this medication (dosage and times per day)?   3. Are you having a reaction (difficulty breathing--STAT)?   4. What is your medication issue?   Patient says she was advised to take Cephalexin  prior to echo and she would like to know why an antibiotic is needed prior to echo. Please advise. Echo is scheduled for today, 7/14 at 2:00 PM.

## 2024-01-10 NOTE — Progress Notes (Signed)
 Echocardiogram 2D Echocardiogram has been performed.  Katelyn Cline 01/10/2024, 2:45 PM

## 2024-01-11 ENCOUNTER — Ambulatory Visit: Payer: Self-pay | Admitting: Cardiovascular Disease

## 2024-01-11 LAB — ECHOCARDIOGRAM COMPLETE
AR max vel: 2.81 cm2
AV Area VTI: 2.9 cm2
AV Area mean vel: 2.84 cm2
AV Mean grad: 12.7 mmHg
AV Peak grad: 24.3 mmHg
Ao pk vel: 2.47 m/s
Area-P 1/2: 2.37 cm2
MV VTI: 4.12 cm2
S' Lateral: 3.2 cm

## 2024-01-24 ENCOUNTER — Encounter: Payer: Self-pay | Admitting: Hematology

## 2024-01-24 ENCOUNTER — Encounter: Payer: Self-pay | Admitting: Cardiovascular Disease

## 2024-01-24 ENCOUNTER — Ambulatory Visit: Attending: Cardiovascular Disease | Admitting: Cardiovascular Disease

## 2024-01-24 VITALS — BP 116/72 | HR 65 | Ht 68.5 in | Wt 189.2 lb

## 2024-01-24 DIAGNOSIS — E785 Hyperlipidemia, unspecified: Secondary | ICD-10-CM | POA: Diagnosis not present

## 2024-01-24 DIAGNOSIS — I10 Essential (primary) hypertension: Secondary | ICD-10-CM

## 2024-01-24 DIAGNOSIS — I480 Paroxysmal atrial fibrillation: Secondary | ICD-10-CM

## 2024-01-24 DIAGNOSIS — F17200 Nicotine dependence, unspecified, uncomplicated: Secondary | ICD-10-CM | POA: Diagnosis not present

## 2024-01-24 DIAGNOSIS — Z952 Presence of prosthetic heart valve: Secondary | ICD-10-CM

## 2024-01-24 DIAGNOSIS — E782 Mixed hyperlipidemia: Secondary | ICD-10-CM

## 2024-01-24 DIAGNOSIS — I25110 Atherosclerotic heart disease of native coronary artery with unstable angina pectoris: Secondary | ICD-10-CM

## 2024-01-24 DIAGNOSIS — Z951 Presence of aortocoronary bypass graft: Secondary | ICD-10-CM

## 2024-01-24 DIAGNOSIS — Z953 Presence of xenogenic heart valve: Secondary | ICD-10-CM

## 2024-01-24 DIAGNOSIS — I739 Peripheral vascular disease, unspecified: Secondary | ICD-10-CM

## 2024-01-24 NOTE — Assessment & Plan Note (Signed)
 History of PAD with claudication with ABIs in the 0.53 range bilaterally with SFA disease.  Given her recent coronary revascularization and prolonged recovery I think we should wait at least 6 months prior to addressing.  I have also talked to her about the portance of smoking cessation.

## 2024-01-24 NOTE — Assessment & Plan Note (Signed)
 Long history of tobacco abuse having smoked 2 packs a day since she was 63 years old.  She was smoking 1/2 pack a day up until her recent coronary intervention.  Now she is smoking 5 cigarettes a day.  We talked about the importance of smoking cessation.

## 2024-01-24 NOTE — Progress Notes (Signed)
 01/24/2024 Katelyn Cline   06-22-1961  [id number]  Primary Physician Wendolyn Jenkins Jansky, MD Primary Cardiologist: Dorn JINNY Lesches MD [facility] [place], [place]  HPI:  Katelyn Cline is a 63 y.o.  moderately overweight divorced Caucasian female mother of 1 son who was referred to me by Dr. Wendolyn, her PCP, for atrial fibrillation.  I last saw her in the office.  She is 10/20/2023.  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  She was disabled because of panic attacks and agoraphobia.  Her risk factors include greater than 100 pack years of tobacco abuse having smoked 2 packs a day since she was 63 years old, currently smoking 1/2 pack/day.  She has been on supplemental oxygen  24/7 for the last 12 years.  She has a diagnosis of COPD.  Her other risk factors include treated diabetes, hypertension and hyperlipidemia.  She is a strong family history for heart disease with a mother who had stent, father had bypass surgery and a brother who had a myocardial infarction.   She is apparently down in [place]  on vacation in [place] in September and was awakened with shortness of breath.  She was evaluated at [facility] although the specifics of the hospitalization are unclear.  She was told that she was in heart failure and initially they recommended cardiac catheterization but this was never performed.  She really denies chest pain.   She has undergone outpatient DC cardioversion by Dr. Francyne on Monday successfully to sinus rhythm.  She had a coronary calcium  score performed 07/19/2023 which showed a total of 1238 with calcium  distributed in all 3 coronary arteries.  A 2D echo revealed normal LV systolic function, mild concentric LVH with moderate aortic stenosis and a valve area of 1 cm.    She had a cardiac PET study performed 09/14/2023 that was high risk.  This led to a outpatient diagnostic cardiac cath which I arrange for Dr. Anner to do via the  right radial approach that revealed three-vessel disease.  She scheduled to see Dr. Shyrl in the office 10/29/2023 for consideration of CABG/AVR.  After that, we will circle back around to readdress her PAD.  We have talked extensively about smoking cessation as well.  Since I saw her 3 months ago she did have right left heart cath performed by Dr. Anner revealing three-vessel disease.  She underwent CABG x 3 with aortic valve replacement and left atrial clipping by Dr. Shyrl 11/03/2023.  She had a 23 mm Inspira's aortic valve replaced.  Her hospital course was prolonged.  Her most recent 2D echo performed 01/10/2024 revealed normal LV systolic function with a well-functioning aortic bioprosthesis.  Unfortunately, she still continues to smoke 5 cigarettes a day.   Current Meds  Medication Sig   acetaminophen  (TYLENOL ) 325 MG tablet Take 2 tablets (650 mg total) by mouth every 6 (six) hours as needed for headache or mild pain (pain score 1-3).   albuterol  (VENTOLIN  HFA) 108 (90 Base) MCG/ACT inhaler Inhale 2 puffs into the lungs every 6 (six) hours as needed for wheezing or shortness of breath (as needed).   aspirin  EC 81 MG tablet Take 1 tablet (81 mg total) by mouth daily. Swallow whole.   atorvastatin  (LIPITOR) 80 MG tablet Take 1 tablet (80 mg total) by mouth at bedtime.   buPROPion  ER (WELLBUTRIN  SR) 100 MG 12 hr tablet Take 1 tablet by mouth every morning  and take 1 tablet by mouth every day at bedtime.   cephALEXin  (KEFLEX ) 500 MG capsule Take four tablets 30-60 minutes prior to dental procedures.   DULoxetine  (CYMBALTA ) 60 MG capsule Take 1 capsule by mouth twice daily. Needs appointment.   ELIQUIS  5 MG TABS tablet Take 1 tablet (5 mg total) by mouth 2 (two) times daily.   empagliflozin  (JARDIANCE ) 10 MG TABS tablet Take 1 tablet (10 mg total) by mouth daily.   ezetimibe  (ZETIA ) 10 MG tablet Take 1 tablet (10 mg total) by mouth every morning.   fenofibrate  (TRICOR ) 145 MG tablet Take 1  tablet (145 mg total) by mouth at bedtime.   fish oil-omega-3 fatty acids  1000 MG capsule Take 2 capsules (2 g total) by mouth 2 (two) times daily.   furosemide  (LASIX ) 40 MG tablet Take 1 tablet by mouth once daily   glucose blood (ACCU-CHEK GUIDE) test strip Use as instructed   hydroxyurea  (HYDREA ) 500 MG capsule Take 2 capsules by mouth daily at bedtime with an additional 1 capsule on Monday and Thursday. (Patient taking differently: Take 1,000-1,500 mg by mouth See admin instructions. Take 2 capsules at daily at bedtime with 1 additional capsule on Mon, Wed, Fri nigtht)   lisinopril  (ZESTRIL ) 10 MG tablet Take 1 tablet (10 mg total) by mouth at bedtime.   metFORMIN  (GLUCOPHAGE ) 1000 MG tablet Take 1 tablet by mouth every morning and take 1 tablet by mouth every day at bedtime.   metoprolol  tartrate (LOPRESSOR ) 25 MG tablet Take 0.5 tablets (12.5 mg total) by mouth 2 (two) times daily.   nystatin  (MYCOSTATIN /NYSTOP ) powder Apply 1 Application topically 3 (three) times daily. PA Case: [id number] Approved 06/30/2019- 06/28/2020 (Patient taking differently: Apply 1 Application topically 3 (three) times daily as needed (irritation). PA Case: [id number] Approved 06/30/2019- 06/28/2020)   nystatin  cream (MYCOSTATIN ) Apply topically twice daily. (Patient taking differently: Apply 1 Application topically 2 (two) times daily as needed for dry skin.)   oxyCODONE -acetaminophen  (PERCOCET) 10-325 MG tablet Take 1 tablet by mouth every 4 (four) hours as needed for severe pain (pain score 7-10).   senna (SENOKOT) 8.6 MG TABS tablet Take 2 tablets by mouth daily as needed for mild constipation.   spironolactone  (ALDACTONE ) 25 MG tablet Take 0.5 tablets (12.5 mg total) by mouth daily.   Vitamin D , Ergocalciferol , (DRISDOL ) 1.25 MG (50000 UNIT) CAPS capsule Take 1 capsule by mouth on Sundays at bedtime.     Allergies  Allergen Reactions   Contrast Media [Iodinated Contrast Media] Anaphylaxis    CAT Scan Conrast     Erythromycin     Stomach pain   Flagyl [Metronidazole] Other (See Comments)    Generalized pain, caused sickness   Other     Iodine Dye   Penicillins Other (See Comments)    Patient was an infant, no idea of reaction. Tolerates Keflex . Did it involve swelling of the face/tongue/throat, SOB, or low BP? Unknown Did it involve sudden or severe rash/hives, skin peeling, or any reaction on the inside of your mouth or nose? Unknown Did you need to seek medical attention at a hospital or doctor's office? Unknown When did it last happen?      infant If all above answers are NO, may proceed with cephalosporin use. CHILDHOOD ALLERGY    Tetracyclines & Related Other (See Comments)    GI side effects    Social History   Socioeconomic History   Marital status: Divorced    Spouse name: Not on file  Number of children: 1   Years of education: 12+   Highest education level: Associate degree: occupational, Scientist, product/process development, or vocational program  Occupational History   Occupation: disabled  Tobacco Use   Smoking status: Every Day    Current packs/day: 0.50    Average packs/day: 0.5 packs/day for 50.0 years (25.0 ttl pk-yrs)    Types: Cigarettes   Smokeless tobacco: Never   Tobacco comments:    Current smoker 10 cigarettes daily 06/10/23  Vaping Use   Vaping status: Former   Quit date: 09/15/2016  Substance and Sexual Activity   Alcohol  use: Yes    Alcohol /week: 1.0 standard drink of alcohol     Types: 1 Standard drinks or equivalent per week    Comment: Not weekly   Drug use: Yes    Types: Oxycodone    Sexual activity: Not Currently    Birth control/protection: Surgical  Other Topics Concern   Not on file  Social History Narrative   Son-lives w/son.   disabled   Social Drivers of Corporate investment banker Strain: High Risk (01/02/2024)   Overall Financial Resource Strain (CARDIA)    Difficulty of Paying Living Expenses: Very hard  Food Insecurity: No Food Insecurity (01/02/2024)    Hunger Vital Sign    Worried About Running Out of Food in the Last Year: Never true    Ran Out of Food in the Last Year: Never true  Transportation Needs: Unmet Transportation Needs (01/06/2024)   PRAPARE - Administrator, Civil Service (Medical): Yes    Lack of Transportation (Non-Medical): Yes  Physical Activity: Insufficiently Active (01/02/2024)   Exercise Vital Sign    Days of Exercise per Week: 1 day    Minutes of Exercise per Session: 10 min  Stress: Stress Concern Present (01/02/2024)   [employer] of Occupational Health - Occupational Stress Questionnaire    Feeling of Stress: Very much  Social Connections: Socially Isolated (01/02/2024)   Social Connection and Isolation Panel    Frequency of Communication with Friends and Family: Never    Frequency of Social Gatherings with Friends and Family: Never    Attends Religious Services: Never    Database administrator or Organizations: No    Attends Engineer, structural: Not on file    Marital Status: Divorced  Intimate Partner Violence: Not At Risk (01/04/2024)   Humiliation, Afraid, Rape, and Kick questionnaire    Fear of Current or Ex-Partner: No    Emotionally Abused: No    Physically Abused: No    Sexually Abused: No     Review of Systems: General: negative for chills, fever, night sweats or weight changes.  Cardiovascular: negative for chest pain, dyspnea on exertion, edema, orthopnea, palpitations, paroxysmal nocturnal dyspnea or shortness of breath Dermatological: negative for rash Respiratory: negative for cough or wheezing Urologic: negative for hematuria Abdominal: negative for nausea, vomiting, diarrhea, bright red blood per rectum, melena, or hematemesis Neurologic: negative for visual changes, syncope, or dizziness All other systems reviewed and are otherwise negative except as noted above.    Blood pressure 116/72, pulse 65, height 5' 8.5 (1.74 m), weight 189 lb 3.2 oz (85.8 kg), SpO2 96%.   General appearance: alert and no distress Neck: no adenopathy, no carotid bruit, no JVD, supple, symmetrical, trachea midline, and thyroid  not enlarged, symmetric, no tenderness/mass/nodules Lungs: clear to auscultation bilaterally Heart: regular rate and rhythm, S1, S2 normal, no murmur, click, rub or gallop Extremities: extremities normal, atraumatic, no cyanosis or edema  Pulses: 2+ and symmetric Skin: Skin color, texture, turgor normal. No rashes or lesions Neurologic: Grossly normal  EKG not performed today      ASSESSMENT AND PLAN:   Hyperlipidemia with target low density lipoprotein (LDL) cholesterol less than 55 mg/dL History of hyperlipidemia on high-dose statin therapy as well as Zetia  and fenofibrate  with lipid profile performed 09/08/2023 revealing a total cholesterol 115, LDL of 57 and HDL of 38.  Tobacco use disorder Long history of tobacco abuse having smoked 2 packs a day since she was 63 years old.  She was smoking 1/2 pack a day up until her recent coronary intervention.  Now she is smoking 5 cigarettes a day.  We talked about the importance of smoking cessation.  Essential hypertension, benign History of essential hypertension with blood pressure measured today at 116/72.  She is on lisinopril  and metoprolol .  Atrial fibrillation ([name]) History of atrial fibrillation status post successful outpatient DC cardioversion by Dr. Francyne back to sinus rhythm 09/06/2023.  She remains on Eliquis  oral anticoagulation.  She did have a left atrial clip placed at the time of her bypass surgery..  Coronary artery disease involving native coronary artery of native heart with unstable angina pectoris [facility]) History of CAD status post right left heart cath by Dr. Anner 10/01/23 demonstrating three-vessel disease.  She ultimately underwent CABG x 3 by Dr. Shyrl 11/03/2023 with a LIMA to LAD, vein to the first obtuse marginal branch and PDA.  She also had aortic valve replacement with a  23 mm Inspira's valve.  She had left atrial clipping as well.  S/P aortic valve replacement with bioprosthetic valve Moderate aortic stenosis status post aortic valve replacement with a 23 mm Inspira's valve in the time of CABG by Dr. Shyrl 11/03/2023.  Her most recent 2D echo performed 01/10/2024 revealed normal LV systolic function and a well-functioning aortic bioprosthesis.  PAD (peripheral artery disease) ([name]) History of PAD with claudication with ABIs in the 0.53 range bilaterally with SFA disease.  Given her recent coronary revascularization and prolonged recovery I think we should wait at least 6 months prior to addressing.  I have also talked to her about the portance of smoking cessation.     Dorn DOROTHA Lesches MD FACP,FACC,FAHA, [facility] 01/24/2024 10:35 AM

## 2024-01-24 NOTE — Assessment & Plan Note (Signed)
 History of essential hypertension with blood pressure measured today at 116/72.  She is on lisinopril  and metoprolol .

## 2024-01-24 NOTE — Assessment & Plan Note (Signed)
 History of CAD status post right left heart cath by Dr. Anner 10/01/23 demonstrating three-vessel disease.  She ultimately underwent CABG x 3 by Dr. Shyrl 11/03/2023 with a LIMA to LAD, vein to the first obtuse marginal branch and PDA.  She also had aortic valve replacement with a 23 mm Inspira's valve.  She had left atrial clipping as well.

## 2024-01-24 NOTE — Assessment & Plan Note (Signed)
 History of hyperlipidemia on high-dose statin therapy as well as Zetia  and fenofibrate  with lipid profile performed 09/08/2023 revealing a total cholesterol 115, LDL of 57 and HDL of 38.

## 2024-01-24 NOTE — Assessment & Plan Note (Signed)
 Moderate aortic stenosis status post aortic valve replacement with a 23 mm Inspira's valve in the time of CABG by Dr. Shyrl 11/03/2023.  Her most recent 2D echo performed 01/10/2024 revealed normal LV systolic function and a well-functioning aortic bioprosthesis.

## 2024-01-24 NOTE — Assessment & Plan Note (Signed)
 History of atrial fibrillation status post successful outpatient DC cardioversion by Dr. Francyne back to sinus rhythm 09/06/2023.  She remains on Eliquis  oral anticoagulation.  She did have a left atrial clip placed at the time of her bypass surgery.SABRA

## 2024-01-24 NOTE — Patient Instructions (Addendum)
 Medication Instructions:  Your physician recommends that you continue on your current medications as directed. Please refer to the Current Medication list given to you today.  *If you need a refill on your cardiac medications before your next appointment, please call your pharmacy*  Testing/Procedures: Your physician has requested that you have an echocardiogram. Echocardiography is a painless test that uses sound waves to create images of your heart. It provides your doctor with information about the size and shape of your heart and how well your heart's chambers and valves are working. This procedure takes approximately one hour. There are no restrictions for this procedure. Please do NOT wear cologne, perfume, aftershave, or lotions (deodorant is allowed). Please arrive 15 minutes prior to your appointment time.  Please note: We ask at that you not bring children with you during ultrasound (echo/ vascular) testing. Due to room size and safety concerns, children are not allowed in the ultrasound rooms during exams. Our front office staff cannot provide observation of children in our lobby area while testing is being conducted. An adult accompanying a patient to their appointment will only be allowed in the ultrasound room at the discretion of the ultrasound technician under special circumstances. We apologize for any inconvenience. **To do in July 2026**   Follow-Up: At [facility], you and your health needs are our priority.  As part of our continuing mission to provide you with exceptional heart care, our providers are all part of one team.  This team includes your primary Cardiologist (physician) and Advanced Practice Providers or APPs (Physician Assistants and Nurse Practitioners) who all work together to provide you with the care you need, when you need it.  Your next appointment:   3 month(s)  Provider:   Katlyn West, NP         Then, Dorn Lesches, MD will plan to see you  again in 12 month(s).    We recommend signing up for the patient portal called MyChart.  Sign up information is provided on this After Visit Summary.  MyChart is used to connect with patients for Virtual Visits (Telemedicine).  Patients are able to view lab/test results, encounter notes, upcoming appointments, etc.  Non-urgent messages can be sent to your provider as well.   To learn more about what you can do with MyChart, go to [email or web address].

## 2024-02-05 ENCOUNTER — Other Ambulatory Visit: Payer: Self-pay | Admitting: Family Medicine

## 2024-02-05 DIAGNOSIS — E1140 Type 2 diabetes mellitus with diabetic neuropathy, unspecified: Secondary | ICD-10-CM

## 2024-02-05 DIAGNOSIS — F4310 Post-traumatic stress disorder, unspecified: Secondary | ICD-10-CM

## 2024-02-07 NOTE — Telephone Encounter (Signed)
 Needs in person appt-not seen since Oct

## 2024-03-01 ENCOUNTER — Other Ambulatory Visit: Payer: Self-pay | Admitting: Family Medicine

## 2024-03-01 DIAGNOSIS — F4310 Post-traumatic stress disorder, unspecified: Secondary | ICD-10-CM

## 2024-03-01 NOTE — Telephone Encounter (Signed)
NEEDS IN PERSON APPT

## 2024-03-08 ENCOUNTER — Other Ambulatory Visit: Payer: Self-pay | Admitting: Family Medicine

## 2024-03-08 DIAGNOSIS — E1140 Type 2 diabetes mellitus with diabetic neuropathy, unspecified: Secondary | ICD-10-CM

## 2024-03-12 ENCOUNTER — Other Ambulatory Visit: Payer: Self-pay | Admitting: Family Medicine

## 2024-03-12 DIAGNOSIS — F4310 Post-traumatic stress disorder, unspecified: Secondary | ICD-10-CM

## 2024-03-12 NOTE — Telephone Encounter (Signed)
 Needs appt

## 2024-03-15 ENCOUNTER — Encounter: Payer: Self-pay | Admitting: Hematology

## 2024-03-19 ENCOUNTER — Other Ambulatory Visit: Payer: Self-pay | Admitting: Cardiology

## 2024-03-21 ENCOUNTER — Other Ambulatory Visit: Payer: Self-pay

## 2024-03-21 DIAGNOSIS — D45 Polycythemia vera: Secondary | ICD-10-CM

## 2024-03-22 ENCOUNTER — Encounter: Payer: Self-pay | Admitting: Hematology

## 2024-03-22 ENCOUNTER — Inpatient Hospital Stay: Attending: Hematology

## 2024-03-22 ENCOUNTER — Inpatient Hospital Stay

## 2024-03-22 ENCOUNTER — Inpatient Hospital Stay: Admitting: Hematology

## 2024-03-22 VITALS — BP 124/87 | HR 59 | Temp 97.3°F | Resp 20 | Wt 194.7 lb

## 2024-03-22 DIAGNOSIS — F17210 Nicotine dependence, cigarettes, uncomplicated: Secondary | ICD-10-CM | POA: Insufficient documentation

## 2024-03-22 DIAGNOSIS — D45 Polycythemia vera: Secondary | ICD-10-CM | POA: Diagnosis present

## 2024-03-22 LAB — CBC WITH DIFFERENTIAL (CANCER CENTER ONLY)
Abs Immature Granulocytes: 0.05 K/uL (ref 0.00–0.07)
Basophils Absolute: 0.1 K/uL (ref 0.0–0.1)
Basophils Relative: 1 %
Eosinophils Absolute: 0.1 K/uL (ref 0.0–0.5)
Eosinophils Relative: 1 %
HCT: 37.9 % (ref 36.0–46.0)
Hemoglobin: 11.4 g/dL — ABNORMAL LOW (ref 12.0–15.0)
Immature Granulocytes: 1 %
Lymphocytes Relative: 17 %
Lymphs Abs: 1.2 K/uL (ref 0.7–4.0)
MCH: 30.7 pg (ref 26.0–34.0)
MCHC: 30.1 g/dL (ref 30.0–36.0)
MCV: 102.2 fL — ABNORMAL HIGH (ref 80.0–100.0)
Monocytes Absolute: 0.2 K/uL (ref 0.1–1.0)
Monocytes Relative: 3 %
Neutro Abs: 5.6 K/uL (ref 1.7–7.7)
Neutrophils Relative %: 77 %
Platelet Count: 205 K/uL (ref 150–400)
RBC: 3.71 MIL/uL — ABNORMAL LOW (ref 3.87–5.11)
RDW: 17.7 % — ABNORMAL HIGH (ref 11.5–15.5)
WBC Count: 7.2 K/uL (ref 4.0–10.5)
nRBC: 0 % (ref 0.0–0.2)

## 2024-03-22 LAB — CMP (CANCER CENTER ONLY)
ALT: 14 U/L (ref 0–44)
AST: 19 U/L (ref 15–41)
Albumin: 4.1 g/dL (ref 3.5–5.0)
Alkaline Phosphatase: 76 U/L (ref 38–126)
Anion gap: 4 — ABNORMAL LOW (ref 5–15)
BUN: 39 mg/dL — ABNORMAL HIGH (ref 8–23)
CO2: 31 mmol/L (ref 22–32)
Calcium: 9.1 mg/dL (ref 8.9–10.3)
Chloride: 102 mmol/L (ref 98–111)
Creatinine: 0.87 mg/dL (ref 0.44–1.00)
GFR, Estimated: >60 mL/min (ref >60)
Glucose, Bld: 99 mg/dL (ref 70–99)
Potassium: 5.1 mmol/L (ref 3.5–5.1)
Sodium: 137 mmol/L (ref 135–145)
Total Bilirubin: 0.3 mg/dL (ref 0.0–1.2)
Total Protein: 6.9 g/dL (ref 6.5–8.1)

## 2024-03-22 LAB — FERRITIN: Ferritin: 22 ng/mL (ref 11–307)

## 2024-03-22 LAB — IRON AND IRON BINDING CAPACITY (CC-WL,HP ONLY)
Iron: 44 ug/dL (ref 28–170)
Saturation Ratios: 7 % — ABNORMAL LOW (ref 10.4–31.8)
TIBC: 622 ug/dL — ABNORMAL HIGH (ref 250–450)
UIBC: 578 ug/dL — ABNORMAL HIGH (ref 148–442)

## 2024-03-22 NOTE — Progress Notes (Signed)
 HEMATOLOGY ONCOLOGY PROGRESS NOTE  Date of service: 03/22/2024  Patient Care Team: Wendolyn Jenkins Jansky, MD as PCP - General (Family Medicine) Court Dorn PARAS, MD as PCP - Cardiology (Cardiology) Onesimo Emaline Brink, MD as Consulting Physician (Hematology) Nicholaus Sherlean [facility], [facility] (Inactive) as Pharmacist (Pharmacist) Cleatus Collar, MD as Consulting Physician (Ophthalmology) Court Dorn PARAS, MD as Consulting Physician (Cardiology)  CHIEF COMPLAINTS/PURPOSE OF CONSULTATION:  Evaluation and management of polycythemia vera  HISTORY OF PRESENTING ILLNESS:  Please see previous note for details of initial presentation.  SUMMARY OF ONCOLOGIC HISTORY: Oncology History  Polycythemia vera ([name])  08/05/2012 Initial Diagnosis   Polycythemia vera ([name])   History of malignant melanoma  10/08/2015 Imaging   CT CAP- Pulmonary nodules are stable to slightly decreased in size. No new sites of metastatic disease in the chest, abdomen or pelvis.    05/06/2016 Imaging   CT CAP- Stable patchy centrilobular ground-glass micronodularity throughout both lungs, upper lung predominant. Stable mosaic attenuation throughout both lungs. This combination of findings raises the possibility of subacute hypersensitivity pneumonitis. However, a few scattered solid pulmonary nodules measuring up to 7 mm in the left lower lobe are new or slightly increased in size, and pulmonary metastases cannot be excluded given this patient's history of pulmonary metastatic melanoma. These nodules are below PET resolution. Continued chest CT surveillance is warranted in 3-6 months.   08/13/2016 Imaging   CT chest- 1. Most previously noted pulmonary nodules appear stable compared to the prior study. The exception is the largest nodule in the left lower lobe which currently measures 8 mm (previously 7 mm on 05/06/2016 and 6 mm on 10/08/2015). 2. Aortic atherosclerosis, in addition to left main and 3 vessel coronary artery  disease. Please note that although the presence of coronary artery calcium  documents the presence of coronary artery disease, the severity of this disease and any potential stenosis cannot be assessed on this non-gated CT examination. Assessment for potential risk factor modification, dietary therapy or pharmacologic therapy may be warranted, if clinically indicated. 3. There are calcifications of the aortic valve. Echocardiographic correlation for evaluation of potential valvular dysfunction may be warranted if clinically indicated.     INTERVAL HISTORY: Katelyn Cline is a 63 y.o. female here for continued evaluation and management of her polycythemia vera.  Last seen by me on 12/21/2023. Today she is ambulating with a Rolator.   She endorses compliance with her Hydroxyurea  1000 mg daily at bedtime with an extra 500 mg on Monday/Wednesday/Friday - denies intolerance/side effects -  Has been taking her Eliquis  5 mg BID, and ASA 81 mg.  Denies bloody noses, notes an occasional red tint when she is experiencing rhinorrhea.   S/p cardiac bypass 10/2023, which she subsequently was seen in PT for, and she notes this was rather difficult for her as she has always experienced weakness in her legs and relies on her arms to lift herself.   She denies respiratory issues, though is still smoking 2-3 cigarettes/ day.  REVIEW OF SYSTEMS:    10 Point review of systems of done and is negative except as noted above.   Past Medical History:  Diagnosis Date   Agoraphobia    Anxiety    Arthritis    all joints   Chronic low back pain    Chronic respiratory failure with hypoxia ([name])    4L  via [place],  followed by pcp,   (09-16-2018  per pt only uses while at home and at night ,  portable  oxygen  is not working)   Clotting disorder 2013   Polycythemia vera   Colon polyps    COPD (chronic obstructive pulmonary disease) ([name])    DDD (degenerative disc disease), lumbosacral    Dental caries    DM  type 2 (diabetes mellitus, type 2) ([name])    followed by pcp   Fibromyalgia    GERD (gastroesophageal reflux disease)    occasional , no meds   Heart murmur 2020   History of basal cell carcinoma (BCC) excision    s/p  Moh's of face/ nose 12/ 2013   History of palpitations    per pt found to have occaional PVCs   History of UTI    HTN (hypertension)    Hyperlipidemia    Loose, teeth    Major depression    Metastatic melanoma ([name]) 10/18/2012   12 mm posterior right upper lobe pulmonary nodule, max SUV 3.0  s/p  right wedge resection 10-21-2012,  followed by oncologist-- dr onesimo,  no recurrence   Mixed stress and urge urinary incontinence    Myeloproliferative neoplasm ([name])    Narcolepsy    Obesity    OSA (obstructive sleep apnea)    Oxygen  deficiency    Oxygen  dependent    4 L via [place],  per pt portable oxygen  not working but does use while at home and at night   Panic attacks    Periodontitis    Peripheral neuropathy    feet   Polycythemia vera(238.4) hematology/ oncology-- dr onesimo ([facility])   first dx 02014 due to smoker/copd--  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)  hx phlebotomies   Pulmonary nodules    bilateral small stable per CT 03-11-2018   Scoliosis    Sleep apnea    Smokers' cough ([name])    per pt productive a little in the morning's   Symptoms of upper respiratory infection (URI) 03/12/2018   Urinary (tract) obstruction    Wears glasses      Past Surgical History:  Procedure Laterality Date   ABDOMINAL HYSTERECTOMY  1998   partial   AORTIC VALVE REPLACEMENT N/A 11/03/2023   Procedure: REPLACEMENT, AORTIC VALVE, OPEN USING INSPIRIS RESILIA AORTIC VALVE;  Surgeon: Shyrl Linnie KIDD, MD;  Location: [facility] OR;  Service: Open Heart Surgery;  Laterality: N/A;   BREAST LUMPECTOMY Bilateral 1995   benign per pt   CARDIOVERSION N/A 09/06/2023   Procedure: CARDIOVERSION;  Surgeon: Francyne Headland, MD;  Location: [facility] INVASIVE CV LAB;   Service: Cardiovascular;  Laterality: N/A;   CLIPPING OF ATRIAL APPENDAGE N/A 11/03/2023   Procedure: CLIPPING, LEFT ATRIAL APPENDAGE USING ATRICURE ATRICLIP;  Surgeon: Shyrl Linnie KIDD, MD;  Location: [facility] OR;  Service: Open Heart Surgery;  Laterality: N/A;   COLONOSCOPY W/ BIOPSIES AND POLYPECTOMY     Hx: of   CORONARY ARTERY BYPASS GRAFT N/A 11/03/2023   Procedure: CORONARY ARTERY BYPASS GRAFTING X 3, USING LEFT INTERNAL MAMMARY ARTERY AND RIGHT ENDOSCOPIC HARVESTED GREATER SAPHENOUS VEIN;  Surgeon: Shyrl Linnie KIDD, MD;  Location: [facility] OR;  Service: Open Heart Surgery;  Laterality: N/A;   CYSTECTOMY  2000   abdominal wall    DILATION AND CURETTAGE OF UTERUS  yrs ago   EXPLORATION POST OPERATIVE OPEN HEART N/A 11/03/2023   Procedure: EXPLORATION POST OPERATIVE OPEN HEART; WASHOUT;  Surgeon: Shyrl Linnie KIDD, MD;  Location: [facility] OR;  Service: Open Heart Surgery;  Laterality: N/A;   IR THORACENTESIS ASP PLEURAL SPACE W/IMG GUIDE  11/10/2023  IR THORACENTESIS ASP PLEURAL SPACE W/IMG GUIDE  11/23/2023   MOHS SURGERY  2013   nose/face   MULTIPLE EXTRACTIONS WITH ALVEOLOPLASTY N/A 09/19/2018   Procedure: Extraction of tooth #'s 2, 3, 6-14, 18, and 20-30 with alveoloplasty;  Surgeon: Cyndee Tanda FALCON, DDS;  Location: [facility] ORS;  Service: Oral Surgery;  Laterality: N/A;  GENERAL WITH NASAL TUBE   RIGHT/LEFT HEART CATH AND CORONARY ANGIOGRAPHY N/A 10/01/2023   Procedure: RIGHT/LEFT HEART CATH AND CORONARY ANGIOGRAPHY;  Surgeon: Anner Alm ORN, MD;  [facility] CV [facility];  Service: Cardiovascular;  Laterality: N/A;   TEE WITHOUT CARDIOVERSION N/A 11/03/2023   Procedure: ECHOCARDIOGRAM, TRANSESOPHAGEAL;  Surgeon: Shyrl Linnie KIDD, MD;  Location: [facility];  Service: Open Heart Surgery;  Laterality: N/A;   TEE WITHOUT CARDIOVERSION N/A 11/03/2023   Procedure: ECHOCARDIOGRAM, TRANSESOPHAGEAL;  Surgeon: Shyrl Linnie KIDD, MD;  Location: [facility];  Service: Open Heart Surgery;  Laterality: N/A;    VIDEO ASSISTED THORACOSCOPY (VATS)/WEDGE RESECTION Right 10/21/2012   Procedure: VIDEO ASSISTED THORACOSCOPY (VATS)/WEDGE RESECTION;  Surgeon: Dallas KATHEE Jude, MD;  Location: [facility] OR;  Service: Thoracic;  Laterality: Right;   VIDEO BRONCHOSCOPY N/A 10/21/2012   Procedure: VIDEO BRONCHOSCOPY;  Surgeon: [place] KATHEE Jude, MD;  Location: [facility] OR;  Service: Thoracic;  Laterality: N/A;     Social History   Tobacco Use   Smoking status: Every Day    Current packs/day: 0.50    Average packs/day: 0.5 packs/day for 50.0 years (25.0 ttl pk-yrs)    Types: Cigarettes   Smokeless tobacco: Never   Tobacco comments:    Current smoker 10 cigarettes daily 06/10/23  Vaping Use   Vaping status: Former   Quit date: 09/15/2016  Substance Use Topics   Alcohol  use: Yes    Alcohol /week: 1.0 standard drink of alcohol     Types: 1 Standard drinks or equivalent per week    Comment: Not weekly   Drug use: Yes    Types: Oxycodone     ALLERGIES:  is allergic to contrast media [iodinated contrast media], erythromycin, flagyl [metronidazole], other, penicillins, and tetracyclines & related.  MEDICATIONS:  Current Outpatient Medications  Medication Sig Dispense Refill   acetaminophen  (TYLENOL ) 325 MG tablet Take 2 tablets (650 mg total) by mouth every 6 (six) hours as needed for headache or mild pain (pain score 1-3).     albuterol  (VENTOLIN  HFA) 108 (90 Base) MCG/ACT inhaler Inhale 2 puffs into the lungs every 6 (six) hours as needed for wheezing or shortness of breath (as needed). 17 each 3   aspirin  EC 81 MG tablet Take 1 tablet (81 mg total) by mouth daily. Swallow whole. 100 tablet 4   atorvastatin  (LIPITOR) 80 MG tablet Take 1 tablet (80 mg total) by mouth at bedtime. 90 tablet 3   Blood Glucose Monitoring Suppl (BLOOD GLUCOSE SYSTEM PAK) KIT Please dispense based on patient and insurance preference. Use as directed to monitor FSBS 2x daily. Dx: E11.9 1 kit 1   buPROPion  ER (WELLBUTRIN  SR) 100 MG 12 hr tablet  TAKE 1 TABLET BY MOUTH IN THE MORNING AND 1 TABLET AT BEDTIME 60 tablet 0   cephALEXin  (KEFLEX ) 500 MG capsule Take four tablets 30-60 minutes prior to dental procedures. 4 capsule 4   DULoxetine  (CYMBALTA ) 60 MG capsule TAKE 1 CAPSULE BY MOUTH TWICE DAILY. PLEASE  SCHEDULE  AN  APPOINTMENT 60 capsule 0   ELIQUIS  5 MG TABS tablet Take 1 tablet (5 mg total) by mouth 2 (two) times daily. 180 tablet 3  empagliflozin  (JARDIANCE ) 10 MG TABS tablet Take 1 tablet (10 mg total) by mouth daily. 90 tablet 3   ezetimibe  (ZETIA ) 10 MG tablet Take 1 tablet (10 mg total) by mouth every morning. 90 tablet 3   fenofibrate  (TRICOR ) 145 MG tablet Take 1 tablet (145 mg total) by mouth at bedtime. 90 tablet 3   fish oil-omega-3 fatty acids  1000 MG capsule Take 2 capsules (2 g total) by mouth 2 (two) times daily. 180 capsule 1   furosemide  (LASIX ) 40 MG tablet Take 1 tablet by mouth once daily 90 tablet 3   glucose blood (ACCU-CHEK GUIDE) test strip Use as instructed 100 each 12   Glucose Blood (BLOOD GLUCOSE TEST STRIPS) STRP Please dispense based on patient and insurance preference. Use as directed to monitor FSBS 2x daily. Dx: E11.9 100 strip 1   hydroxyurea  (HYDREA ) 500 MG capsule Take 2 capsules by mouth daily at bedtime with an additional 1 capsule on Monday and Thursday. (Patient taking differently: Take 1,000-1,500 mg by mouth See admin instructions. Take 2 capsules at daily at bedtime with 1 additional capsule on Mon, Wed, Fri nigtht) 90 capsule 2   lisinopril  (ZESTRIL ) 10 MG tablet Take 1 tablet (10 mg total) by mouth at bedtime. 90 tablet 3   metFORMIN  (GLUCOPHAGE ) 1000 MG tablet TAKE 1 TABLET BY MOUTH IN THE MORNING AND 1 TABLET AT BEDTIME 60 tablet 0   metoprolol  tartrate (LOPRESSOR ) 25 MG tablet Take 1/2 (one-half) tablet by mouth twice daily 90 tablet 3   Microlet Lancets MISC Please dispense based on patient and insurance preference. Use as directed to monitor FSBS 2x daily. Dx: E11.9 100 each 11    nystatin  (MYCOSTATIN /NYSTOP ) powder Apply 1 Application topically 3 (three) times daily. PA Case: [id number] Approved 06/30/2019- 06/28/2020 (Patient taking differently: Apply 1 Application topically 3 (three) times daily as needed (irritation). PA Case: [id number] Approved 06/30/2019- 06/28/2020) 60 g 2   nystatin  cream (MYCOSTATIN ) Apply topically twice daily. (Patient taking differently: Apply 1 Application topically 2 (two) times daily as needed for dry skin.) 60 g 1   omega-3 acid ethyl esters (LOVAZA ) 1 g capsule Take 1 capsule by mouth 2 (two) times daily.     oxyCODONE -acetaminophen  (PERCOCET) 10-325 MG tablet Take 1 tablet by mouth every 4 (four) hours as needed for severe pain (pain score 7-10).     senna (SENOKOT) 8.6 MG TABS tablet Take 2 tablets by mouth daily as needed for mild constipation.     spironolactone  (ALDACTONE ) 25 MG tablet Take 0.5 tablets (12.5 mg total) by mouth daily. 45 tablet 3   Vitamin D , Ergocalciferol , (DRISDOL ) 1.25 MG (50000 UNIT) CAPS capsule Take 1 capsule by mouth on Sundays at bedtime. 12 capsule 0   No current facility-administered medications for this visit.    PHYSICAL EXAMINATION: ECOG PERFORMANCE STATUS: 3 - Symptomatic, >50% confined to bed   Vitals:   03/22/24 0950  BP: 124/87  Pulse: (!) 59  Resp: 20  Temp: (!) 97.3 F (36.3 C)  SpO2: 93%    Filed Weights   03/22/24 0950  Weight: 194 lb 11.2 oz (88.3 kg)  Body mass index is 29.17 kg/m.  GENERAL:alert, in no acute distress and comfortable SKIN: no acute rashes, no significant lesions EYES: conjunctiva are pink and non-injected, sclera anicteric OROPHARYNX: MMM, no exudates, no oropharyngeal erythema or ulceration NECK: supple, no JVD LYMPH:  no palpable lymphadenopathy in the cervical, axillary or inguinal regions LUNGS: clear to auscultation b/l with normal respiratory effort HEART:  regular rate & rhythm ABDOMEN:  normoactive bowel sounds , non tender, not distended. Extremity: no pedal  edema PSYCH: alert & oriented x 3 with fluent speech NEURO: no focal motor/sensory deficits  LABORATORY DATA:   I have reviewed the data as listed      Latest Ref Rng & Units 03/22/2024    9:00 AM 12/21/2023   10:11 AM 11/24/2023    8:07 AM 11/22/2023    3:29 AM 11/17/2023    3:35 AM  CBC  WBC 4.0 - 10.5 K/uL 7.2  7.7  12.6  13.0  17.4   Hemoglobin 12.0 - 15.0 g/dL 88.5  88.5  89.7  9.9  10.2   Hematocrit 36.0 - 46.0 % 37.9  37.0  33.3  32.8  33.8   Platelets 150 - 400 K/uL 205  172  438  568  1,221         Latest Ref Rng & Units 03/22/2024    9:00 AM 12/21/2023   10:11 AM 11/24/2023    8:07 AM  CMP  Glucose 70 - 99 mg/dL 99  852  888   BUN 8 - 23 mg/dL 39  28  19   Creatinine 0.44 - 1.00 mg/dL 9.12  9.32  9.31   Sodium 135 - 145 mmol/L 137  135  139   Potassium 3.5 - 5.1 mmol/L 5.1  4.5  4.1   Chloride 98 - 111 mmol/L 102  102  102   CO2 22 - 32 mmol/L 31  26  30    Calcium  8.9 - 10.3 mg/dL 9.1  9.6  9.1   Total Protein 6.5 - 8.1 g/dL 6.9  6.9    Total Bilirubin 0.0 - 1.2 mg/dL 0.3  0.4    Alkaline Phos 38 - 126 U/L 76  67    AST 15 - 41 U/L 19  18    ALT 0 - 44 U/L 14  13       RADIOGRAPHIC STUDIES: I have personally reviewed the radiological images as listed and agreed with the findings in the report. No results found.  ASSESSMENT & PLAN:  63 y.o. female with   1. Jak2 V617F mutation positive Myeloproliferative syndrome - BM Bx consistent with Polycythemia Vera. JAK2 mutation identified in 08/15/12 labs  03/15/18 - BM Bx revealed results consistent with JAK2 positive polycythemia vera.   WBC stable, PLTs 172K > 295K stable, HCT stable; Hgb remains 11.4, but has not decreased further since June.  No compliance issues with medications.    2. COPD - O2 dependant.   Still smoking 2-3 cigarettes daily.   3. History of Metastatic melanoma to the right lung  S/p resection by Dr. Army on 10/21/12, BRAF mutation not detected.  PLAN: 1- Reviewed recent labs with  patient: WBC stable, PLTs 172K > 295K stable, HCT stable; Hgb remains 11.4, but has not decreased further since June.  Hematocrit today is 37.9. Patient's platelets are at goal of between 200-400k Her hematocrit is acceptable and is at less than the target of 45%. No indication for therapeutic phlebotomy at this time which would be indicated if her hematocrit is more than 45%. We shall continue Hydroxyurea  1000 mg daily at bedtime with an extra 500 mg on Monday/Wednesday/Friday, - She will continue her Eliquis  5 mg BID, and ASA 81 mg.  - No new issues with VTE. - Strongly encouraged smoking cessation in the context of her recent coronary artery bypass graft surgery as well as her VTE  risk with polycythemia vera.  FOLLOW-UP i RTC with Dr Onesimo with labs and appointment for therapeutic phlebotomy in 4 months   The total time spent in the appointment was 30 minutes*.  All of the patient's questions were answered with apparent satisfaction. The patient knows to call the clinic with any problems, questions or concerns.   Emaline Onesimo MD MS AAHIVMS [facility] Hematology/Oncology Physician [facility]  .*Total Encounter Time as defined by the Centers for Medicare and Medicaid Services includes, in addition to the face-to-face time of a patient visit (documented in the note above) non-face-to-face time: obtaining and reviewing outside history, ordering and reviewing medications, tests or procedures, care coordination (communications with other health care professionals or caregivers) and documentation in the medical record.  I,Emily Lagle,acting as a Neurosurgeon for Emaline Onesimo, MD.,have documented all relevant documentation on the behalf of Emaline Onesimo, MD,as directed by  Emaline Onesimo, MD while in the presence of Emaline Onesimo, MD.  .I have reviewed the above documentation for accuracy and completeness, and I agree with the above. .Carry Ortez Kishore Runell Kovich MD

## 2024-03-23 ENCOUNTER — Telehealth: Payer: Self-pay | Admitting: Pharmacy Technician

## 2024-03-23 NOTE — Telephone Encounter (Signed)
 Pharmacy Patient Advocate Encounter  Received notification from [employer] that Prior Authorization for eliquis  has been APPROVED from 03/23/24 to 06/28/24   PA #/Case ID/Reference #: [id number]

## 2024-03-23 NOTE — Telephone Encounter (Signed)
   Pharmacy Patient Advocate Encounter   Received notification from Onbase that prior authorization for ELIQUIS  is required/requested.   Insurance verification completed.   The patient is insured through [employer] .   Per test claim: PA required; PA submitted to above mentioned insurance via Latent Key/confirmation #/EOC [id number] Status is pending

## 2024-03-28 ENCOUNTER — Encounter: Payer: Self-pay | Admitting: Hematology

## 2024-04-06 ENCOUNTER — Other Ambulatory Visit: Payer: Self-pay | Admitting: Family Medicine

## 2024-04-06 DIAGNOSIS — F4310 Post-traumatic stress disorder, unspecified: Secondary | ICD-10-CM

## 2024-04-06 DIAGNOSIS — E1140 Type 2 diabetes mellitus with diabetic neuropathy, unspecified: Secondary | ICD-10-CM

## 2024-04-07 ENCOUNTER — Other Ambulatory Visit: Payer: Self-pay | Admitting: Family Medicine

## 2024-04-07 DIAGNOSIS — E1140 Type 2 diabetes mellitus with diabetic neuropathy, unspecified: Secondary | ICD-10-CM

## 2024-04-07 MED ORDER — METFORMIN HCL 1000 MG PO TABS: ORAL_TABLET | ORAL | 0 refills | Status: DC

## 2024-04-07 NOTE — Telephone Encounter (Signed)
 Copied from CRM [id number]. Topic: Clinical - Medication Refill >> Apr 07, 2024 10:47 AM Eva FALCON wrote: Medication: metFORMIN  (GLUCOPHAGE ) 1000 MG tablet, DULoxetine  (CYMBALTA ) 60 MG capsule  Has the patient contacted their pharmacy? No (Agent: If no, request that the patient contact the pharmacy for the refill. If patient does not wish to contact the pharmacy document the reason why and proceed with request.) (Agent: If yes, when and what did the pharmacy advise?)  This is the patient's preferred pharmacy:  [facility] [address], [place] - [address] [place]  [address] Phone: [phone] Fax: [phone]  Is this the correct pharmacy for this prescription? Yes If no, delete pharmacy and type the correct one.   Has the prescription been filled recently? Yes  Is the patient out of the medication? Yes  Has the patient been seen for an appointment in the last year OR does the patient have an upcoming appointment? Yes  Can we respond through MyChart? Yes  Agent: Please be advised that Rx refills may take up to 3 business days. We ask that you follow-up with your pharmacy.

## 2024-04-09 ENCOUNTER — Other Ambulatory Visit: Payer: Self-pay | Admitting: Family Medicine

## 2024-04-09 DIAGNOSIS — F4310 Post-traumatic stress disorder, unspecified: Secondary | ICD-10-CM

## 2024-04-14 ENCOUNTER — Ambulatory Visit: Admitting: Family Medicine

## 2024-04-24 ENCOUNTER — Encounter: Payer: Self-pay | Admitting: Family Medicine

## 2024-04-24 ENCOUNTER — Ambulatory Visit (INDEPENDENT_AMBULATORY_CARE_PROVIDER_SITE_OTHER): Admitting: Family Medicine

## 2024-04-24 VITALS — BP 130/62 | HR 92 | Temp 98.2°F | Ht 68.5 in | Wt 195.0 lb

## 2024-04-24 DIAGNOSIS — E785 Hyperlipidemia, unspecified: Secondary | ICD-10-CM | POA: Diagnosis not present

## 2024-04-24 DIAGNOSIS — G894 Chronic pain syndrome: Secondary | ICD-10-CM

## 2024-04-24 DIAGNOSIS — E559 Vitamin D deficiency, unspecified: Secondary | ICD-10-CM | POA: Diagnosis not present

## 2024-04-24 DIAGNOSIS — Z79899 Other long term (current) drug therapy: Secondary | ICD-10-CM | POA: Diagnosis not present

## 2024-04-24 DIAGNOSIS — G44219 Episodic tension-type headache, not intractable: Secondary | ICD-10-CM | POA: Diagnosis not present

## 2024-04-24 DIAGNOSIS — I10 Essential (primary) hypertension: Secondary | ICD-10-CM

## 2024-04-24 DIAGNOSIS — I25110 Atherosclerotic heart disease of native coronary artery with unstable angina pectoris: Secondary | ICD-10-CM

## 2024-04-24 DIAGNOSIS — D471 Chronic myeloproliferative disease: Secondary | ICD-10-CM

## 2024-04-24 DIAGNOSIS — R431 Parosmia: Secondary | ICD-10-CM

## 2024-04-24 DIAGNOSIS — E1140 Type 2 diabetes mellitus with diabetic neuropathy, unspecified: Secondary | ICD-10-CM

## 2024-04-24 DIAGNOSIS — F4310 Post-traumatic stress disorder, unspecified: Secondary | ICD-10-CM

## 2024-04-24 DIAGNOSIS — D45 Polycythemia vera: Secondary | ICD-10-CM

## 2024-04-24 MED ORDER — VARENICLINE TARTRATE (STARTER) 0.5 MG X 11 & 1 MG X 42 PO TBPK: ORAL_TABLET | ORAL | 0 refills | Status: AC

## 2024-04-24 MED ORDER — IPRATROPIUM BROMIDE 0.06 % NA SOLN: 2.0000 | Freq: Four times a day (QID) | NASAL | 1 refills | Status: AC | PRN

## 2024-04-24 MED ORDER — BUPROPION HCL ER (SR) 100 MG PO TB12: ORAL_TABLET | ORAL | 1 refills | Status: AC

## 2024-04-24 MED ORDER — DULOXETINE HCL 60 MG PO CPEP
60.0000 mg | ORAL_CAPSULE | Freq: Two times a day (BID) | ORAL | 1 refills | Status: AC
Start: 2024-04-24 — End: ?

## 2024-04-24 MED ORDER — METFORMIN HCL 1000 MG PO TABS: 1000.0000 mg | ORAL_TABLET | Freq: Two times a day (BID) | ORAL | 1 refills | Status: AC

## 2024-04-24 NOTE — Patient Instructions (Signed)
 It was very nice to see you today!  Call with the strips   PLEASE NOTE:  If you had any lab tests please let us  know if you have not heard back within a few days. You may see your results on MyChart before we have a chance to review them but we will give you a call once they are reviewed by us . If we ordered any referrals today, please let us  know if you have not heard from their office within the next week.   Please try these tips to maintain a healthy lifestyle:  Eat most of your calories during the day when you are active. Eliminate processed foods including packaged sweets (pies, cakes, cookies), reduce intake of potatoes, white bread, white pasta, and white rice. Look for whole grain options, oat flour or almond flour.  Each meal should contain half fruits/vegetables, one quarter protein, and one quarter carbs (no bigger than a computer mouse).  Cut down on sweet beverages. This includes juice, soda, and sweet tea. Also watch fruit intake, though this is a healthier sweet option, it still contains natural sugar! Limit to 3 servings daily.  Drink at least 1 glass of water  with each meal and aim for at least 8 glasses per day  Exercise at least 150 minutes every week.

## 2024-04-24 NOTE — Progress Notes (Unsigned)
 Cardiology Office Note    Date:  04/25/2024  ID:  ELIZBETH POSA, DOB 08/20/1960, MRN [id number] PCP:  Wendolyn Jenkins Jansky, MD  Cardiologist:  Dorn Lesches, MD  Electrophysiologist:  None   Chief Complaint: Follow up for CAD   History of Present Illness: .    MONIC ENGELMANN is a 63 y.o. female with visit-pertinent history of CAD s/p CABG x 3 with LIMA to LAD, R SVG to PDA, OM1 along with aortic valve replacement with a 23 mm Inspiris valve and placement of 45 mm atrial clip on 11/03/2023 by Dr. Shyrl, paroxysmal atrial fibrillation, COPD on supplemental oxygen , tobacco use, diabetes, hypertension and hyperlipidemia.  Patient was previously referred to Dr. Wadie for atrial fibrillation.  This patient was previously in [place]  on vacation in [place] in September 2024 with increased shortness of breath.  She was evaluated [facility] and was told that she was inheart failure specifics of hospitalization unavailable.  Patient underwent DCCV with Dr. Francyne in 08/2023 with successful conversion to sinus rhythm.  She had coronary calcium  score performed on 07/19/2023 which showed a total calcium  score of 1238 with calcium  distributed in all 3 coronary arteries.  2D echo revealed normal LV systolic function, mild concentric LVH with moderate aortic stenosis AVA.  Patient underwent cardiac PET on 09/14/2023 that was high risk, she underwent diagnostic cardiac catheterization with Dr. Anner that revealed three-vessel disease.  Patient has been scheduled to be evaluated by CT CS in 10/2023 however prior to this on 10/25/2023 patient presented to the emergency department with increased chest pain Hs troponin 34>>45, EKG with sinus rhythm, ST depression in inferior lateral leads, patient was noted to have a drop in hemoglobin and she reported tarry stools for the past few months.  Patient's anemia improved during hospitalization, did not undergo endoscopy while inpatient.  On 11/03/2023 patient  underwent CABG x 3, AVR, LAA clipping and maze procedure, patient returned to the OR might have procedure with increased bleeding.  Patient was slow to wean from vent, felt to have acute on chronic diastolic CHF, AHF was consulted, started on IV diuresis with Lasix  with good response, patient was extubated on 11/07/2023.  Postop echo on 11/08/2023 indicated LVEF of 60 to 65%, no RWMA, mild LVH, grade 2 DD, RV fracture/volume overload, mildly reduced RV, moderate LAE, mild RA, trivial MR, moderate MAC, bioprosthetic aortic valve in place with mean Mildly elevated at 17 mmHg.  On 5/14 patient underwent left-sided thoracentesis which yielded 1 L of bloody fluid, patient remained hypervolemic on exam with continued diuresis post procedure.  Patient was seen in clinic on 12/07/2023 for follow-up, reported she had been doing well and remained stable from cardiac standpoint.  She was provided prescription for Keflex  prior to dental work.  Echocardiogram on 01/10/2024 indicated LVEF 70 to 75%, no RWMA, mild LVH, diastolic parameters were indeterminate, RV systolic function and size was normal, trivial mitral regurgitation with no evidence of stenosis, severe mitral annular calcification, aortic valve regurgitation was not visualized, no stenosis present, 23 mm Edwards Inspiris Resilia valve present in the aortic position.  Patient was last seen in clinic on 01/24/2024 by Dr. Wadie.  She had remained stable for cardiac standpoint.  He noted that given recent coronary vascularization prolonged recovery he recommended waiting at least 6 months prior to addressing PAD.  Today she presents for follow-up.  She reports that she has been doing well overall.  Notes that she has been having significant problems with  her chronic pain, she is followed by [facility] medical pain clinic.  She presents today in a neck brace, notes that she has had significant pain in her neck in recent weeks.  Denies any chest pain, notes some occasional  discomfort where her midsternal incision is placed, denies any increased shortness of breath, lower extremity edema, orthopnea or PND.  She denies any palpitations or feeling of irregular heartbeats.  Denies any recent presyncope or syncope.  She does report that she was informed this morning that her potassium level was elevated on recent lab work completed at [employer], unfortunately am unable to review these results.  Will check a bmet today for further evaluation, instructed to hold spironolactone  until instructed to resume. ROS: .   Today she denies chest pain, shortness of breath, lower extremity edema, fatigue, palpitations, melena, hematuria, hemoptysis, diaphoresis, weakness, presyncope, syncope, orthopnea, and PND.  All other systems are reviewed and otherwise negative. Studies Reviewed: SABRA   EKG:  EKG is ordered today, personally reviewed, demonstrating  EKG Interpretation Date/Time:  Tuesday April 25 2024 15:43:49 EDT Ventricular Rate:  55 PR Interval:  144 QRS Duration:  100 QT Interval:  436 QTC Calculation: 417 R Axis:   -2  Text Interpretation: Sinus bradycardia Minimal voltage criteria for LVH, may be normal variant ( Cornell product ) Anteroseptal infarct (cited on or before 07-Dec-2023) When compared with ECG of 07-Dec-2023 10:35, T wave inversion less evident in Lateral leads Confirmed by Mersadies Petree [phone]) on 04/25/2024 3:59:32 PM   CV Studies: Cardiac studies reviewed are outlined and summarized above. Otherwise please see EMR for full report. Cardiac Studies & Procedures   ______________________________________________________________________________________________ CARDIAC CATHETERIZATION  CARDIAC CATHETERIZATION 10/01/2023  Conclusion Images from the original result were not included.    Mid LM to Ost LAD lesion is 45% stenosed.   1st Diag lesion is 90% stenosed.   Mid LAD-1 lesion is 70% stenosed.   Mid LAD-2 lesion is 30% stenosed. Mid LAD to Dist LAD  lesion is 70% stenosed. Dist LAD lesion is 40% stenosed.   Prox Cx lesion is 80% stenosed (essentially the ostium of the AV groove portion of the Cx).   1st Mrg lesion is 90% stenosed.   .   Dist Cx lesion is 60% stenosed just prior to 2nd Mrg & 3rd Mrg lesion is 80% stenosed.   Prox RCA to Dist RCA lesion is 100% stenosed.  Retrograde flow from septal collaterals fills the PDA with retrograde flow to the occlusion site.   LV end diastolic pressure is low.  Dominance: Right  POST-OPERATIVE DIAGNOSIS: Mean AoV gradient 19 mmHg with systemic hypertension Multivessel disease: Proximal RCA on percent CTO with the distal portion filling via left-to-right collaterals mostly the PDA.  (Cannot exclude codominant RCA) Distal left main eccentric calcified 40 proximately 50% stenosis of the vessel bifurcates into the LAD and LCx LCx bifurcates quickly into the AV groove and large OM1: OM1 has proximal 90% stenosis and is a large-caliber vessel Ostial AVG-LCx has 80% stenosis and gives rise to small caliber LPL 1(OM2 on image) and LPL 3 (on image LPL1) and large caliber LPL 2 (on image OM3).  60% stenosis just proximal to LPL 1 and then the LPL 2 continuation of the circumflex has 80% stenosis. LAD gives off a large caliber D1 that has a very proximal focal 90% stenosis before it bifurcates into 2 large branches.  There is diffuse disease throughout the vessel.  The LAD after D1 has a focal  70% stenosis.  Then in the mid vessel there is a bend the segment that starts with 30% followed by 70% and then 60%. Relatively normal right heart cath numbers with mean PAP of 18 mmHg and PCWP of 13 mmHg. Normal Cardiac Output and Index by Fick: 6.37-3.08.   RECOMMENDATIONS Discharge to home after PACU; Recommend outpatient CVTS consultation => patient has moderate MR on Echo and Multivessel Disease, best served with CABG/AVR; multiple lesions and likely incomplete revascularization if multivessel PCI is the option  chosen.  Findings Coronary Findings Diagnostic  Dominance: Right  Left Main Vessel was injected. Vessel is large. There is moderate focal disease in the vessel. Mid LM to Ost LAD lesion is 45% stenosed.  Left Anterior Descending Vessel is large. Mid LAD-1 lesion is 70% stenosed. Mid LAD-2 lesion is 30% stenosed. Mid LAD to Dist LAD lesion is 70% stenosed. Dist LAD lesion is 40% stenosed.  First Diagonal Branch There is moderate disease in the vessel. 1st Diag lesion is 90% stenosed.  Lateral First Diagonal Branch Vessel is small in size.  Third Septal Branch Vessel is small in size.  Left Circumflex Vessel is large. Course is as the AV groove LCx Prox Cx lesion is 80% stenosed. Dist Cx lesion is 60% stenosed.  First Obtuse Marginal Branch Vessel is large in size. 1st Mrg lesion is 90% stenosed.  Second Obtuse Marginal Branch OM2 (2nd Mrg vs. LPL 1)  Third Obtuse Marginal Branch Vessel is large in size. 3rd Mrg lesion is 80% stenosed.  Right Coronary Artery Vessel was injected. Vessel is normal in caliber. There is severe diffuse disease throughout the vessel. There is severe focal disease in the vessel. Prox RCA to Dist RCA lesion is 100% stenosed.  Right Ventricular Branch Vessel is small in size.  Right Posterior Descending Artery There is mild disease in the vessel. Collaterals RPDA filled by collaterals from 2nd Sept.  Collaterals RPDA filled by collaterals from 3rd Sept.  First Right Posterolateral Branch Vessel is small in size.  Intervention  No interventions have been documented.   STRESS TESTS  NM PET CT CARDIAC PERFUSION MULTI W/ABSOLUTE BLOODFLOW 09/14/2023  Narrative   The study is high risk.   The study is high risk due to severe reduction in myocardial blood flow in all coronary territories, suggesting multivessel disease.  Findings are also consistent with infarction with peri-infarct ischemia probably in the left circumflex  territory.There is substantial interference with evaluation of the inferolateral wall from tracer uptake in the stomach and spleen.   Myocardial blood flow was computed to be 0.80ml/g/min at rest and 1.24ml/g/min at stress. Global myocardial blood flow reserve was 1.44 and was highly abnormal.   LV perfusion is abnormal. Defect 1: There is a small defect with mild reduction in uptake present in the mid to basal inferior and inferolateral location(s) that is partially reversible. There is normal wall motion in the defect area. Consistent with infarction and peri-infarct ischemia. The defect is consistent with abnormal perfusion in the LCx or RCA territories.   Rest left ventricular function is abnormal. Rest global function is mildly reduced. Rest EF: 48%. Stress left ventricular function is normal. Stress EF: 57%. End diastolic cavity size is mildly enlarged. End systolic cavity size is mildly enlarged.   Coronary calcium  was present on the attenuation correction CT images. Severe coronary calcifications were present. Coronary calcifications were present in the left anterior descending artery, left circumflex artery and right coronary artery distribution(s).   There is heavy  calcification in the aortic valve consistent with known aortic stenosis.   Electronically signed by Jerel Balding, MD  CLINICAL DATA:  This over-read does not include interpretation of cardiac or coronary anatomy or pathology. The Cardiac PET CT interpretation by the cardiologist is attached.  COMPARISON:  Coronary calcium  scoring, 07/25/2023, CT chest, 02/08/2023  FINDINGS: Cardiovascular: Aortic atherosclerosis. Aortic valve calcifications. Cardiomegaly. Three-vessel coronary artery calcifications. Enlargement of the main pulmonary artery measuring up to 3.8 cm in caliber. No pericardial effusion.  Limited Mediastinum/Nodes: No enlarged mediastinal, hilar, or axillary lymph nodes. Trachea and esophagus demonstrate  no significant findings.  Limited Lungs/Pleura: Evidence of prior right upper lobe wedge resection. Mild diffuse bilateral bronchial wall thickening and mosaic attenuation of the airspaces. Unchanged benign nodules, largest in the dependent left lower lobe measuring 0.9 cm (series 4, image 58). No pleural effusion or pneumothorax.  Upper Abdomen: No acute abnormality.  Musculoskeletal: No chest wall abnormality. No acute osseous findings.  IMPRESSION: 1. Mild diffuse bilateral bronchial wall thickening and mosaic attenuation of the airspaces, consistent with nonspecific small airways disease. 2. Evidence of prior right upper lobe wedge resection. 3. Unchanged benign nodules, requiring no specific further follow-up or characterization. 4. Cardiomegaly and coronary artery disease. 5. Enlargement of the main pulmonary artery, as can be seen in pulmonary hypertension. 6. Aortic valve calcifications. Correlate for echocardiographic evidence of aortic valve dysfunction.  Aortic Atherosclerosis (ICD10-I70.0).   Electronically Signed By: Marolyn JONETTA Jaksch M.D. On: 09/14/2023 09:41   ECHOCARDIOGRAM  ECHOCARDIOGRAM COMPLETE 01/10/2024  Narrative ECHOCARDIOGRAM REPORT    Patient Name:   PAULINE PEGUES Date of Exam: 01/10/2024 Medical Rec #:  [id number]        Height:       68.0 in Accession #:    [id number]       Weight:       187.0 lb Date of Birth:  Oct 21, 1960        BSA:          1.986 m Patient Age:    63 years         BP:           147/68 mmHg Patient Gender: F                HR:           66 bpm. Exam Location:  Outpatient  Procedure: 2D Echo, Cardiac Doppler, Color Doppler, Strain Analysis and 3D Echo (Both Spectral and Color Flow Doppler were utilized during procedure).  Indications:    S/P Aortic Valve replacement Z95.2  History:        Patient has prior history of Echocardiogram examinations, most recent 11/08/2023. Angina and CAD, Prior CABG,  PAD, Arrythmias:Atrial Fibrillation and PVC; Risk Factors:Hypertension, Diabetes, Dyslipidemia and Current Smoker. Dr. Shyrl, 11/03/23: CABG X 3. LIMA LAD, RSVG PDA, OM1 Aortic valve replacement with a 23mm Inspiris valve Placement of 45mm Atriclip.  Aortic Valve: 23 mm Edwards Inspiris Resilia valve is present in the aortic position. Procedure Date: 11/03/23.  Sonographer:    Thea Norlander RCS Referring Phys: ANN MARIE KULIK  IMPRESSIONS   1. Left ventricular ejection fraction, by estimation, is 70 to 75%. The left ventricle has hyperdynamic function. The left ventricle has no regional wall motion abnormalities. There is mild left ventricular hypertrophy. Left ventricular diastolic parameters are indeterminate. 2. Right ventricular systolic function is normal. The right ventricular size is normal. Tricuspid regurgitation signal is inadequate for assessing PA pressure. 3. The mitral valve is  degenerative. Trivial mitral valve regurgitation. No evidence of mitral stenosis. Severe mitral annular calcification. 4. The aortic valve has been repaired/replaced. Aortic valve regurgitation is not visualized. No aortic stenosis is present. There is a 23 mm Edwards Inspiris Resilia valve present in the aortic position. Procedure Date: 11/03/23. Echo findings are consistent with normal structure and function of the aortic valve prosthesis. Aortic valve area, by VTI measures 2.90 cm. Aortic valve mean gradient measures 12.7 mmHg. Aortic valve Vmax measures 2.47 m/s. 5. The inferior vena cava is normal in size with greater than 50% respiratory variability, suggesting right atrial pressure of 3 mmHg.  FINDINGS Left Ventricle: Left ventricular ejection fraction, by estimation, is 70 to 75%. The left ventricle has hyperdynamic function. The left ventricle has no regional wall motion abnormalities. The left ventricular internal cavity size was normal in size. There is mild left ventricular hypertrophy.  Left ventricular diastolic parameters are indeterminate.  Right Ventricle: The right ventricular size is normal. No increase in right ventricular wall thickness. Right ventricular systolic function is normal. Tricuspid regurgitation signal is inadequate for assessing PA pressure.  Left Atrium: Left atrial size was normal in size.  Right Atrium: Right atrial size was normal in size.  Pericardium: There is no evidence of pericardial effusion.  Mitral Valve: The mitral valve is degenerative in appearance. Severe mitral annular calcification. Trivial mitral valve regurgitation. No evidence of mitral valve stenosis. MV peak gradient, 5.6 mmHg. The mean mitral valve gradient is 2.0 mmHg with average heart rate of 65 bpm.  Tricuspid Valve: The tricuspid valve is normal in structure. Tricuspid valve regurgitation is trivial. No evidence of tricuspid stenosis.  Aortic Valve: The aortic valve has been repaired/replaced. Aortic valve regurgitation is not visualized. No aortic stenosis is present. Aortic valve mean gradient measures 12.7 mmHg. Aortic valve peak gradient measures 24.3 mmHg. Aortic valve area, by VTI measures 2.90 cm. There is a 23 mm Edwards Inspiris Resilia valve present in the aortic position. Procedure Date: 11/03/23. Echo findings are consistent with normal structure and function of the aortic valve prosthesis.  Pulmonic Valve: The pulmonic valve was normal in structure. Pulmonic valve regurgitation is trivial. No evidence of pulmonic stenosis.  Aorta: The aortic root is normal in size and structure.  Venous: The inferior vena cava is normal in size with greater than 50% respiratory variability, suggesting right atrial pressure of 3 mmHg.  IAS/Shunts: No atrial level shunt detected by color flow Doppler.  Additional Comments: 3D was performed not requiring image post processing on an independent workstation and was indeterminate.   LEFT VENTRICLE PLAX 2D LVIDd:         4.80 cm    Diastology LVIDs:         3.20 cm   LV e' medial:    5.98 cm/s LV PW:         1.10 cm   LV E/e' medial:  14.1 LV IVS:        1.00 cm   LV e' lateral:   6.53 cm/s LVOT diam:     2.10 cm   LV E/e' lateral: 12.9 LV SV:         147 LV SV Index:   74 LVOT Area:     3.46 cm  3D Volume EF: 3D EF:        65 % LV EDV:       216 ml LV ESV:       75 ml LV SV:  141 ml  RIGHT VENTRICLE            IVC RV S prime:     7.78 cm/s  IVC diam: 1.50 cm TAPSE (M-mode): 1.4 cm  LEFT ATRIUM             Index        RIGHT ATRIUM           Index LA diam:        4.30 cm 2.16 cm/m   RA Area:     16.00 cm LA Vol (A2C):   71.2 ml 35.82 ml/m  RA Volume:   42.00 ml  21.15 ml/m LA Vol (A4C):   57.8 ml 29.10 ml/m LA Biplane Vol: 66.7 ml 33.58 ml/m AORTIC VALVE AV Area (Vmax):    2.81 cm AV Area (Vmean):   2.84 cm AV Area (VTI):     2.90 cm AV Vmax:           246.67 cm/s AV Vmean:          159.667 cm/s AV VTI:            0.505 m AV Peak Grad:      24.3 mmHg AV Mean Grad:      12.7 mmHg LVOT Vmax:         200.00 cm/s LVOT Vmean:        131.000 cm/s LVOT VTI:          0.423 m LVOT/AV VTI ratio: 0.84  AORTA Ao Root diam: 2.60 cm Ao Asc diam:  3.80 cm  MITRAL VALVE MV Area (PHT): 2.37 cm     SHUNTS MV Area VTI:   4.12 cm     Systemic VTI:  0.42 m MV Peak grad:  5.6 mmHg     Systemic Diam: 2.10 cm MV Mean grad:  2.0 mmHg MV Vmax:       1.18 m/s MV Vmean:      70.7 cm/s MV Decel Time: 320 msec MV E velocity: 84.40 cm/s MV A velocity: 113.00 cm/s MV E/A ratio:  0.75  Soyla Merck MD Electronically signed by Soyla Merck MD Signature Date/Time: 01/11/2024/12:02:22 PM    Final   TEE  ECHO INTRAOPERATIVE TEE 11/03/2023  Narrative *INTRAOPERATIVE TRANSESOPHAGEAL REPORT *    Patient Name:   NIELS DELENA CONE Date of Exam: 11/03/2023 Medical Rec #:  [id number]        Height:       68.5 in Accession #:    [id number]       Weight:       221.0 lb Date of Birth:  1960/07/12         BSA:          2.14 m Patient Age:    63 years         BP:           134/56 mmHg Patient Gender: F                HR:           66 bpm. Exam Location:  Anesthesiology  Transesophogeal exam was perform intraoperatively during surgical procedure. Patient was closely monitored under general anesthesia during the entirety of examination.  Indications:     CAD, AS Performing Phys: [id number] HARRELL O LIGHTFOOT  Complications: No known complications during this procedure. POST-OP IMPRESSIONS _ Left Ventricle: has normal systolic function, with an ejection fraction of 55%. The cavity size was normal. The wall motion  is normal. _ Right Ventricle: The right ventricle appears unchanged from pre-bypass. _ Aorta: The aorta appears unchanged from pre-bypass. _ Left Atrial Appendage: The left atrial appendage appears unchanged from pre-bypass. _ Aortic Valve: No stenosis present. A bioprosthetic valve was placed, leaflets thin and leaflets are freely mobile Manufactured by; Inspiris Size; 23mm. There is no regurgitation. The gradient recorded across the prosthetic valve is within the expected range. No perivalvular leak noted. _ Mitral Valve: The mitral valve appears unchanged from pre-bypass. There is mild regurgitation. _ Tricuspid Valve: There is trivial regurgitation. _ Pulmonic Valve: The pulmonic valve appears unchanged from pre-bypass. _ Interatrial Septum: The interatrial septum appears unchanged from pre-bypass. _ Pericardium: The pericardium appears unchanged from pre-bypass. _ Comments: Post-bypass images reviewed with surgeon.  PRE-OP FINDINGS Left Ventricle: The left ventricle has normal systolic function, with an ejection fraction of 55-60%. The cavity size was normal. There is mild concentric left ventricular hypertrophy.   Right Ventricle: The right ventricle has normal systolic function. The cavity was normal. There is no increase in right ventricular wall thickness.  Left  Atrium: Left atrial size was dilated. No left atrial/left atrial appendage thrombus was detected. Left atrial appendage velocity is normal at greater than 40 cm/s.  Right Atrium: Right atrial size was normal in size.  Interatrial Septum: No atrial level shunt detected by color flow Doppler.  Pericardium: There is no evidence of pericardial effusion.  Mitral Valve: Degenerative. Mitral valve regurgitation is trivial by color flow Doppler. There is No evidence of mitral stenosis. There is severe calcifcation present on the mitral annulus.  Tricuspid Valve: The tricuspid valve was normal in structure. Tricuspid valve regurgitation is trivial by color flow Doppler.  Aortic Valve: The aortic valve is tricuspid Aortic valve regurgitation was not visualized by color flow Doppler. There is moderate stenosis of the aortic valve. There is moderate calcification present. The non-coronary cusp appears immobile.  Pulmonic Valve: The pulmonic valve was normal in structure. Pulmonic valve regurgitation is trivial by color flow Doppler.   Aorta: The aortic root is normal in size and structure. There is evidence of plaque in the ascending aorta, aortic arch and descending aorta.  Pulmonary Artery: The pulmonary artery is of normal size.  +-------------+---------++ AORTIC VALVE           +-------------+---------++ AV Mean Grad:21.0 mmHg +-------------+---------++   Garnette Skillern MD Electronically signed by Garnette Skillern MD Signature Date/Time: 11/03/2023/6:12:29 PM    Final  MONITORS  LONG TERM MONITOR (3-14 DAYS) 07/09/2023  Narrative Patch Wear Time:  13 days and 23 hours (2024-12-15T19:07:31-0500 to 2024-12-29T19:07:23-0500)  Atrial Fibrillation occurred continuously (100% burden), ranging from 46-165 bpm (avg of 92 bpm). Isolated VEs were occasional (1.2%, [id number]), VE Couplets were rare (<1.0%, 1320), and no VE Triplets were present. Ventricular Bigeminy was present.  Afib with  100% burden (rate 46-165)   CT SCANS  CT CARDIAC SCORING (SELF PAY ONLY) 07/19/2023  Addendum 07/25/2023 12:03 AM ADDENDUM REPORT: 07/25/2023 00:01  EXAM: OVER-READ INTERPRETATION  CT CHEST  The following report is an over-read performed by radiologist Dr. Andrea Gasman of [facility] on 07/24/2023. This over-read does not include interpretation of cardiac or coronary anatomy or pathology. The coronary calcium  score interpretation by the cardiologist is attached.  COMPARISON:  Chest CT 02/08/2023  FINDINGS: Vascular: Aortic atherosclerosis. The included aorta is normal in caliber.  Mediastinum/nodes: No adenopathy or mass. Unremarkable esophagus.  Lungs: No focal airspace disease. Unchanged 9 mm pleural based left lower lobe  nodule series 2, image 52. Unchanged 4 mm subpleural left upper lobe nodule series 2, image 8. These nodules demonstrate long-term stability and need no further imaging follow-up. Postsurgical change in the right upper lobe. No pleural fluid. The included airways are patent.  Upper abdomen: No acute or unexpected findings.  Musculoskeletal: There are no acute or suspicious osseous abnormalities.  IMPRESSION: 1.  Aortic Atherosclerosis (ICD10-I70.0). 2. Pulmonary nodules that have been previously evaluated, and need no further imaging follow-up.   Electronically Signed By: Andrea Gasman M.D. On: 07/25/2023 00:01  Narrative CLINICAL DATA:  Cardiovascular Disease Risk stratification  EXAM: Coronary Calcium  Score  TECHNIQUE: A gated, non-contrast computed tomography scan of the heart was performed using 3 mm slice thickness. Axial images were analyzed on a dedicated workstation. Calcium  scoring of the coronary arteries was performed using the Agatston method.  FINDINGS: Coronary arteries: Normal origins.  Coronary Calcium  Score:  Left main: 98.2  Left anterior descending artery: 276  Left circumflex artery:  597  Right coronary artery: 267  Total: 1238  Percentile: 99th  Pericardium: Normal.  Aorta: Normal caliber.  Aortic atherosclerosis.  Aortic leaflet and mitral annular calcification.  Dilated main pulmonary artery (38 mm, non-contrast), suggestive of pulmonary hypertension.  Non-cardiac: See separate report from [name].  IMPRESSION: 1. Coronary calcium  score of 1238. This was 99th percentile for age-, race-, and sex-matched controls (MESA). 2. Aortic leaflet and mitral annular calcification. 3. Aortic atherosclerosis. 4. Dilated main pulmonary artery (38 mm, non-contrast), suggestive of pulmonary hypertension.  RECOMMENDATIONS: Coronary artery calcium  (CAC) score is a strong predictor of incident coronary heart disease (CHD) and provides predictive information beyond traditional risk factors. CAC scoring is reasonable to use in the decision to withhold, postpone, or initiate statin therapy in intermediate-risk or selected borderline-risk asymptomatic adults (age 37-75 years and LDL-C >=70 to <190 mg/dL) who do not have diabetes or established atherosclerotic cardiovascular disease (ASCVD).* In intermediate-risk (10-year ASCVD risk >=7.5% to <20%) adults or selected borderline-risk (10-year ASCVD risk >=5% to <7.5%) adults in whom a CAC score is measured for the purpose of making a treatment decision the following recommendations have been made:  If CAC=0, it is reasonable to withhold statin therapy and reassess in 5 to 10 years, as long as higher risk conditions are absent (diabetes mellitus, family history of premature CHD in first degree relatives (males <55 years; females <65 years), cigarette smoking, or LDL >=190 mg/dL).  If CAC is 1 to 99, it is reasonable to initiate statin therapy for patients >=80 years of age.  If CAC is >=100 or >=75th percentile, it is reasonable to initiate statin therapy at any age.  Cardiology referral should be  considered for patients with CAC scores >=400 or >=75th percentile.  *2018 AHA/ACC/AACVPR/AAPA/ABC/ACPM/ADA/AGS/APhA/ASPC/NLA/PCNA Guideline on the Management of Blood Cholesterol: A Report of the American College of Cardiology/American Heart Association Task Force on Clinical Practice Guidelines. J Am Coll Cardiol. 2019;73(24):3168-3209.  Vinie Maxcy, MD  Electronically Signed: By: Vinie JAYSON Maxcy M.D. On: 07/19/2023 15:24     ______________________________________________________________________________________________       Current Reported Medications:.    Current Meds  Medication Sig   acetaminophen  (TYLENOL ) 325 MG tablet Take 2 tablets (650 mg total) by mouth every 6 (six) hours as needed for headache or mild pain (pain score 1-3).   albuterol  (VENTOLIN  HFA) 108 (90 Base) MCG/ACT inhaler Inhale 2 puffs into the lungs every 6 (six) hours as needed for wheezing or shortness of breath (as needed).  aspirin  EC 81 MG tablet Take 1 tablet (81 mg total) by mouth daily. Swallow whole.   atorvastatin  (LIPITOR) 80 MG tablet Take 1 tablet (80 mg total) by mouth at bedtime.   buPROPion  ER (WELLBUTRIN  SR) 100 MG 12 hr tablet TAKE 1 TABLET BY MOUTH IN THE MORNING AND 1 TABLET AT BEDTIME . APPOINTMENT REQUIRED FOR FUTURE REFILLS   cephALEXin  (KEFLEX ) 500 MG capsule Take four tablets 30-60 minutes prior to dental procedures.   DULoxetine  (CYMBALTA ) 60 MG capsule Take 1 capsule (60 mg total) by mouth 2 (two) times daily.   ELIQUIS  5 MG TABS tablet Take 1 tablet (5 mg total) by mouth 2 (two) times daily.   empagliflozin  (JARDIANCE ) 10 MG TABS tablet Take 1 tablet (10 mg total) by mouth daily.   ezetimibe  (ZETIA ) 10 MG tablet Take 1 tablet (10 mg total) by mouth every morning.   fenofibrate  (TRICOR ) 145 MG tablet Take 1 tablet (145 mg total) by mouth at bedtime.   fish oil-omega-3 fatty acids  1000 MG capsule Take 2 capsules (2 g total) by mouth 2 (two) times daily.   furosemide  (LASIX ) 40  MG tablet Take 1 tablet by mouth once daily   hydroxyurea  (HYDREA ) 500 MG capsule Take 2 capsules by mouth daily at bedtime with an additional 1 capsule on Monday and Thursday. (Patient taking differently: Take 1,000-1,500 mg by mouth See admin instructions. Take 2 capsules at daily at bedtime with 1 additional capsule on Mon, Wed, Fri nigtht)   ipratropium (ATROVENT) 0.06 % nasal spray Place 2 sprays into both nostrils 4 (four) times daily as needed for rhinitis.   lisinopril  (ZESTRIL ) 10 MG tablet Take 1 tablet (10 mg total) by mouth at bedtime.   metFORMIN  (GLUCOPHAGE ) 1000 MG tablet Take 1 tablet (1,000 mg total) by mouth 2 (two) times daily with a meal. TAKE 1 TABLET BY MOUTH IN THE MORNING AND 1 TABLET AT BEDTIME   metoprolol  tartrate (LOPRESSOR ) 25 MG tablet Take 1/2 (one-half) tablet by mouth twice daily   nystatin  cream (MYCOSTATIN ) Apply topically twice daily. (Patient taking differently: Apply 1 Application topically 2 (two) times daily as needed for dry skin.)   oxyCODONE -acetaminophen  (PERCOCET) 10-325 MG tablet Take 1 tablet by mouth every 4 (four) hours as needed for severe pain (pain score 7-10).   senna (SENOKOT) 8.6 MG TABS tablet Take 2 tablets by mouth daily as needed for mild constipation.   spironolactone  (ALDACTONE ) 25 MG tablet Take 0.5 tablets (12.5 mg total) by mouth daily.   Varenicline  Tartrate, Starter, (CHANTIX  STARTING MONTH PAK) 0.5 MG X 11 & 1 MG X 42 TBPK one 0.5 mg tab by mouth 1 x  daily for 3 days, then increase to one 0.5 mg tablet 2 x daily for 4 days, then increase to one 1 mg tab bid   Vitamin D , Ergocalciferol , (DRISDOL ) 1.25 MG (50000 UNIT) CAPS capsule Take 1 capsule by mouth on Sundays at bedtime.   Physical Exam:    VS:  BP 124/64   Pulse (!) 55   Ht 5' 8 (1.727 m)   Wt 195 lb (88.5 kg)   SpO2 96%   BMI 29.65 kg/m    Wt Readings from Last 3 Encounters:  04/25/24 195 lb (88.5 kg)  04/24/24 195 lb (88.5 kg)  03/22/24 194 lb 11.2 oz (88.3 kg)     GEN: Well nourished, well developed in no acute distress NECK: No JVD; No carotid bruits CARDIAC: RRR, no murmurs, rubs, gallops RESPIRATORY:  Clear to  auscultation without rales, wheezing or rhonchi  ABDOMEN: Soft, non-tender, non-distended EXTREMITIES:  No edema; No acute deformity     Asessement and Plan:.    CAD/aortic stenosis: S/p CABG x 3 with bioprosthetic AVR and LAA clip on 11/03/2023.  She denies any increased chest pain or shortness of breath.  Notes current problems with increasing activity related to chronic pain. Reviewed SBE precautions.  Continue Eliquis  5 mg twice daily, aspirin  81 mg daily, Lipitor 80 mg daily, Jardiance  10 mg daily, Zetia  10 mg daily, Tricor  145 mg daily, Lasix  40 mg daily, lisinopril  10 mg daily, metoprolol  tartrate 12.5 mg twice daily.   HFpEF:Post op Echo 11/08/23 showed LVEF 60-65% , no RWMA, mild LVH, grade II DD, RV pressure/volume overload, mildly reduced RV, mod LAE, mild RAE, trivial MR, mod MAC, bioprosthetic aortic valve in place with mean gradient mildly elevated at 17 mmHg. No perivalvular regurgitation noted.  Patient with 2 thoracentesis following bypass surgery. Today she reports that she reports she is doing well, denies increased shortness of breath, lower extremity edema, orthopnea or PND. Continue Lasix  40 mg daily, lisinopril  10 mg daily, Lopressor  12.5 mg twice daily and spironolactone  12.5 mg daily.  COPD/chronic O2 dependence: Patient currently on 3 L nasal cannula chronically.  Hypertension: Blood pressure today 124/64.  Will hold patient's spironolactone  given reports of hyperkalemia, otherwise continue current antihypertensive regimen. Check BMET.   HLD: Last lipid profile on 09/08/23 indicated total cholesterol 115, HDL 38, triglycerides 108 and LDL 57.  Continue atorvastatin  80 mg daily.  PAF: Patient with history of PAF, underwent DCCV in 08/2023.  She denies any palpitations or feeling of being in atrial fibrillation, reports she is  tolerating Eliquis  well, denies any significant bleeding.  Continue metoprolol  tartrate 12.5 mg twice daily and Eliquis  5 mg twice daily.  Type 2 DM: Last hemoglobin A1C 5.1% during admission.  Monitor managed per PCP.  PAD with claudication: Bilateral lower extremity claudication with Dopplers performed on 09/22/2023 revealing right ABI 0.53 and a left 0.59 with bilateral SFA disease.  Per Dr. Sherry note we will defer on interventions until fully healed from bypass, also notes that patient needs smoking cessation.  Tobacco use: Patient reports that she is currently smoking less than a pack a day complete cessation encouraged.   Polycythemia vera: On Hydrea  1000 mg daily and extra 500 mg on Monday Wednesday and Friday.  She is followed by heme-onc.   Disposition: F/u with Ismerai Bin, NP in three months.   Signed, Bostyn Kunkler D Lezlee Gills, NP

## 2024-04-24 NOTE — Progress Notes (Signed)
 Subjective:     Patient ID: Katelyn Cline, female    DOB: 1960/09/14, 63 y.o.   MRN: [id number]  Chief Complaint  Patient presents with   Medical Management of Chronic Issues    Numbness and tingling in feet and shins; mostly R; x few months; do not have podiatrist     Discussed the use of AI scribe software for clinical note transcription with the patient, who gave verbal consent to proceed.  History of Present Illness Katelyn Cline is a 63 year old female who presents for management of chronic pain and medication review. Hasn't seen me in 1 yr-a lot has happened-open heart surgery  She experiences chronic pain that is severe enough to require a neck brace, which she finds uncomfortable. She has tried various muscle relaxers, including those not typically recommended with her current pain medication, Percocet, but none have been effective. She is considering alternative pain management options, such as massage therapy or acupuncture, due to frustration with the current lack of effective treatment. Going to [employer] pain mgmt  She has a history of heart surgery and was offered neck injections for pain relief post-surgery, but was later told she could not receive them. The pain is described as severe, and she is willing to agree to any treatment to alleviate it. She also experiences headaches, which she attributes to neck spasms, and occasional dizziness, particularly when standing up.  She is currently taking metformin  1000 mg twice a day and Jardiance  10 mg daily for diabetes. She checks her blood sugar infrequently, with recent readings typically around 130-150 mg/dL. She reports a significant weight loss from 250 lbs to 195 lbs, attributed to her heart surgery and subsequent changes in eating habits.  She has COPD and uses oxygen , although she did not bring it to the appointment due to logistical issues with her walker and car. She mentions difficulty with mobility and breathing,  particularly when walking long distances.  She is on atorvastatin  80 mg and Zetia  10 mg for cholesterol management, and takes Eliquis  for atrial fibrillation. For mood, she takes Wellbutrin  100 mg twice a day and Cymbalta  twice a day, noting a lifelong history of depression. She reports that she does not really think of suicide but sometimes thinks about not waking up, which she attributes to pain.  She has a myeloproliferative disorder for which she takes hydroxyurea , two capsules at bedtime and three tabs 3 times a week. She also takes lisinopril  10 mg daily, metoprolol  25 mg (half a tablet twice a day), and spironolactone  (half a tablet in the morning). She reports issues with incontinence, describing herself as 'totally and completely incontinent' and using large quantities of incontinence pads.  She has a history of smoking since age [age] and acknowledges the need to quit, especially to proceed with planned leg surgery. She reports numbness in her legs, feet, and hands, which she attributes to neuropathy. She also experiences phantom smells, which she describes as pleasant but unexplained.  PAD-seeing Card tomorrow, but no intervention till stops smoking per pt    Health Maintenance Due  Topic Date Due   Hepatitis B Vaccines 19-59 Average Risk (2 of 3 - 19+ 3-dose series) 07/27/2001   Colonoscopy  Never done   Mammogram  04/21/2019   COVID-19 Vaccine (3 - Moderna risk series) 01/16/2020   Diabetic kidney evaluation - Urine ACR  06/18/2021   DTaP/Tdap/Td (4 - Td or Tdap) 12/09/2021   Zoster Vaccines- Shingrix  (2 of 2) 01/09/2022  FOOT EXAM  05/19/2023   DEXA SCAN  Never done   Influenza Vaccine  01/28/2024   Lung Cancer Screening  02/08/2024   OPHTHALMOLOGY EXAM  03/14/2024    Past Medical History:  Diagnosis Date   Agoraphobia    Anxiety    Arthritis    all joints   Chronic low back pain    Chronic respiratory failure with hypoxia ([name])    4L  via [place],  followed by pcp,    (09-16-2018  per pt only uses while at home and at night ,  portable oxygen  is not working)   Clotting disorder 2013   Polycythemia vera   Colon polyps    COPD (chronic obstructive pulmonary disease) ([name])    DDD (degenerative disc disease), lumbosacral    Dental caries    DM type 2 (diabetes mellitus, type 2) ([name])    followed by pcp   Fibromyalgia    GERD (gastroesophageal reflux disease)    occasional , no meds   Heart murmur 2020   History of basal cell carcinoma (BCC) excision    s/p  Moh's of face/ nose 12/ 2013   History of palpitations    per pt found to have occaional PVCs   History of UTI    HTN (hypertension)    Hyperlipidemia    Loose, teeth    Major depression    Metastatic melanoma ([name]) 10/18/2012   12 mm posterior right upper lobe pulmonary nodule, max SUV 3.0  s/p  right wedge resection 10-21-2012,  followed by oncologist-- dr onesimo,  no recurrence   Mixed stress and urge urinary incontinence    Myeloproliferative neoplasm ([name])    Narcolepsy    Obesity    OSA (obstructive sleep apnea)    Oxygen  deficiency    Oxygen  dependent    4 L via [place],  per pt portable oxygen  not working but does use while at home and at night   Panic attacks    Periodontitis    Peripheral neuropathy    feet   Polycythemia vera(238.4) hematology/ oncology-- dr onesimo ([facility])   first dx 02014 due to smoker/copd--  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)  hx phlebotomies   Pulmonary nodules    bilateral small stable per CT 03-11-2018   Scoliosis    Sleep apnea    Smokers' cough ([name])    per pt productive a little in the morning's   Symptoms of upper respiratory infection (URI) 03/12/2018   Urinary (tract) obstruction    Wears glasses     Past Surgical History:  Procedure Laterality Date   ABDOMINAL HYSTERECTOMY  1998   partial   AORTIC VALVE REPLACEMENT N/A 11/03/2023   Procedure: REPLACEMENT, AORTIC VALVE, OPEN USING INSPIRIS RESILIA AORTIC  VALVE;  Surgeon: Shyrl Linnie KIDD, MD;  Location: [facility] OR;  Service: Open Heart Surgery;  Laterality: N/A;   BREAST LUMPECTOMY Bilateral 1995   benign per pt   CARDIOVERSION N/A 09/06/2023   Procedure: CARDIOVERSION;  Surgeon: Francyne Headland, MD;  Location: [facility] INVASIVE CV [facility];  Service: Cardiovascular;  Laterality: N/A;   CLIPPING OF ATRIAL APPENDAGE N/A 11/03/2023   Procedure: CLIPPING, LEFT ATRIAL APPENDAGE USING ATRICURE ATRICLIP;  Surgeon: Shyrl Linnie KIDD, MD;  Location: [facility];  Service: Open Heart Surgery;  Laterality: N/A;   COLONOSCOPY W/ BIOPSIES AND POLYPECTOMY     Hx: of   CORONARY ARTERY BYPASS GRAFT N/A 11/03/2023   Procedure: CORONARY ARTERY BYPASS GRAFTING X 3,  USING LEFT INTERNAL MAMMARY ARTERY AND RIGHT ENDOSCOPIC HARVESTED GREATER SAPHENOUS VEIN;  Surgeon: Shyrl Linnie KIDD, MD;  Location: [facility];  Service: Open Heart Surgery;  Laterality: N/A;   CYSTECTOMY  2000   abdominal wall    DILATION AND CURETTAGE OF UTERUS  yrs ago   EXPLORATION POST OPERATIVE OPEN HEART N/A 11/03/2023   Procedure: EXPLORATION POST OPERATIVE OPEN HEART; WASHOUT;  Surgeon: Shyrl Linnie KIDD, MD;  Location: [facility];  Service: Open Heart Surgery;  Laterality: N/A;   IR THORACENTESIS ASP PLEURAL SPACE W/IMG GUIDE  11/10/2023   IR THORACENTESIS ASP PLEURAL SPACE W/IMG GUIDE  11/23/2023   MOHS SURGERY  2013   nose/face   MULTIPLE EXTRACTIONS WITH ALVEOLOPLASTY N/A 09/19/2018   Procedure: Extraction of tooth #'s 2, 3, 6-14, 18, and 20-30 with alveoloplasty;  Surgeon: Cyndee Tanda FALCON, DDS;  Location: [facility];  Service: Oral Surgery;  Laterality: N/A;  GENERAL WITH NASAL TUBE   RIGHT/LEFT HEART CATH AND CORONARY ANGIOGRAPHY N/A 10/01/2023   Procedure: RIGHT/LEFT HEART CATH AND CORONARY ANGIOGRAPHY;  Surgeon: Anner Alm ORN, MD;  [facility] INV[facility] CV LAB;  Service: Cardiovascular;  Laterality: N/A;   TEE WITHOUT CARDIOVERSION N/A 11/03/2023   Procedure: ECHOCARDIOGRAM, TRANSESOPHAGEAL;  Surgeon:  Shyrl Linnie KIDD, MD;  Location: [facility];  Service: Open Heart Surgery;  Laterality: N/A;   TEE WITHOUT CARDIOVERSION N/A 11/03/2023   Procedure: ECHOCARDIOGRAM, TRANSESOPHAGEAL;  Surgeon: Shyrl Linnie KIDD, MD;  Location: [facility];  Service: Open Heart Surgery;  Laterality: N/A;   VIDEO ASSISTED THORACOSCOPY (VATS)/WEDGE RESECTION Right 10/21/2012   Procedure: VIDEO ASSISTED THORACOSCOPY (VATS)/WEDGE RESECTION;  Surgeon: [place] KATHEE Jude, MD;  Location: [facility];  Service: Thoracic;  Laterality: Right;   VIDEO BRONCHOSCOPY N/A 10/21/2012   Procedure: VIDEO BRONCHOSCOPY;  Surgeon: [place] KATHEE Jude, MD;  Location: [facility];  Service: Thoracic;  Laterality: N/A;     Current Outpatient Medications:    acetaminophen  (TYLENOL ) 325 MG tablet, Take 2 tablets (650 mg total) by mouth every 6 (six) hours as needed for headache or mild pain (pain score 1-3)., Disp: , Rfl:    albuterol  (VENTOLIN  HFA) 108 (90 Base) MCG/ACT inhaler, Inhale 2 puffs into the lungs every 6 (six) hours as needed for wheezing or shortness of breath (as needed)., Disp: 17 each, Rfl: 3   aspirin  EC 81 MG tablet, Take 1 tablet (81 mg total) by mouth daily. Swallow whole., Disp: 100 tablet, Rfl: 4   atorvastatin  (LIPITOR) 80 MG tablet, Take 1 tablet (80 mg total) by mouth at bedtime., Disp: 90 tablet, Rfl: 3   cephALEXin  (KEFLEX ) 500 MG capsule, Take four tablets 30-60 minutes prior to dental procedures., Disp: 4 capsule, Rfl: 4   ELIQUIS  5 MG TABS tablet, Take 1 tablet (5 mg total) by mouth 2 (two) times daily., Disp: 180 tablet, Rfl: 3   empagliflozin  (JARDIANCE ) 10 MG TABS tablet, Take 1 tablet (10 mg total) by mouth daily., Disp: 90 tablet, Rfl: 3   ezetimibe  (ZETIA ) 10 MG tablet, Take 1 tablet (10 mg total) by mouth every morning., Disp: 90 tablet, Rfl: 3   fenofibrate  (TRICOR ) 145 MG tablet, Take 1 tablet (145 mg total) by mouth at bedtime., Disp: 90 tablet, Rfl: 3   fish oil-omega-3 fatty acids  1000 MG capsule, Take 2 capsules (2 g  total) by mouth 2 (two) times daily., Disp: 180 capsule, Rfl: 1   furosemide  (LASIX ) 40 MG tablet, Take 1 tablet by mouth once daily, Disp: 90 tablet, Rfl: 3  hydroxyurea  (HYDREA ) 500 MG capsule, Take 2 capsules by mouth daily at bedtime with an additional 1 capsule on Monday and Thursday. (Patient taking differently: Take 1,000-1,500 mg by mouth See admin instructions. Take 2 capsules at daily at bedtime with 1 additional capsule on Mon, Wed, Fri nigtht), Disp: 90 capsule, Rfl: 2   ipratropium (ATROVENT) 0.06 % nasal spray, Place 2 sprays into both nostrils 4 (four) times daily as needed for rhinitis., Disp: 15 mL, Rfl: 1   lisinopril  (ZESTRIL ) 10 MG tablet, Take 1 tablet (10 mg total) by mouth at bedtime., Disp: 90 tablet, Rfl: 3   metoprolol  tartrate (LOPRESSOR ) 25 MG tablet, Take 1/2 (one-half) tablet by mouth twice daily, Disp: 90 tablet, Rfl: 3   nystatin  cream (MYCOSTATIN ), Apply topically twice daily. (Patient taking differently: Apply 1 Application topically 2 (two) times daily as needed for dry skin.), Disp: 60 g, Rfl: 1   oxyCODONE -acetaminophen  (PERCOCET) 10-325 MG tablet, Take 1 tablet by mouth every 4 (four) hours as needed for severe pain (pain score 7-10)., Disp: , Rfl:    senna (SENOKOT) 8.6 MG TABS tablet, Take 2 tablets by mouth daily as needed for mild constipation., Disp: , Rfl:    spironolactone  (ALDACTONE ) 25 MG tablet, Take 0.5 tablets (12.5 mg total) by mouth daily., Disp: 45 tablet, Rfl: 3   Varenicline  Tartrate, Starter, (CHANTIX  STARTING MONTH PAK) 0.5 MG X 11 & 1 MG X 42 TBPK, one 0.5 mg tab by mouth 1 x  daily for 3 days, then increase to one 0.5 mg tablet 2 x daily for 4 days, then increase to one 1 mg tab bid, Disp: 1 each, Rfl: 0   Vitamin D , Ergocalciferol , (DRISDOL ) 1.25 MG (50000 UNIT) CAPS capsule, Take 1 capsule by mouth on Sundays at bedtime., Disp: 12 capsule, Rfl: 0   buPROPion  ER (WELLBUTRIN  SR) 100 MG 12 hr tablet, TAKE 1 TABLET BY MOUTH IN THE MORNING AND 1  TABLET AT BEDTIME . APPOINTMENT REQUIRED FOR FUTURE REFILLS, Disp: 180 tablet, Rfl: 1   DULoxetine  (CYMBALTA ) 60 MG capsule, Take 1 capsule (60 mg total) by mouth 2 (two) times daily., Disp: 180 capsule, Rfl: 1   metFORMIN  (GLUCOPHAGE ) 1000 MG tablet, Take 1 tablet (1,000 mg total) by mouth 2 (two) times daily with a meal. TAKE 1 TABLET BY MOUTH IN THE MORNING AND 1 TABLET AT BEDTIME, Disp: 180 tablet, Rfl: 1  Allergies  Allergen Reactions   Contrast Media [Iodinated Contrast Media] Anaphylaxis    CAT Scan Conrast    Erythromycin     Stomach pain   Flagyl [Metronidazole] Other (See Comments)    Generalized pain, caused sickness   Other     Iodine Dye   Penicillins Other (See Comments)    Patient was an infant, no idea of reaction. Tolerates Keflex . Did it involve swelling of the face/tongue/throat, SOB, or low BP? Unknown Did it involve sudden or severe rash/hives, skin peeling, or any reaction on the inside of your mouth or nose? Unknown Did you need to seek medical attention at a hospital or doctor's office? Unknown When did it last happen?      infant If all above answers are NO, may proceed with cephalosporin use. CHILDHOOD ALLERGY    Tetracyclines & Related Other (See Comments)    GI side effects   ROS neg/noncontributory except as noted HPI/below      Objective:     BP 130/62 (Cuff Size: Large)   Pulse 92   Temp 98.2 F (36.8  C)   Ht 5' 8.5 (1.74 m)   Wt 195 lb (88.5 kg)   SpO2 99%   BMI 29.22 kg/m  Wt Readings from Last 3 Encounters:  04/24/24 195 lb (88.5 kg)  03/22/24 194 lb 11.2 oz (88.3 kg)  01/24/24 189 lb 3.2 oz (85.8 kg)    Physical Exam VITALS: BP- 130/62 MEASUREMENTS: Weight- 195. GENERAL: Well developed, well nourished, no acute distress. HEAD EYES EARS NOSE THROAT: Normocephalic, atraumatic, conjunctiva not injected, sclera nonicteric. CARDIAC: Regular rate and rhythm, S1 S2 present, 3/6 systolic murmur, dorsalis pedis pulse barely  palpable. NECK: Supple, no thyromegaly, no nodes, +bilateral carotid bruits. LUNGS: Coarse breath sounds, no wheezes. ABDOMEN: Bowel sounds present, soft, non-tender, non-distended, no hepatosplenomegaly, no masses. EXTREMITIES: , thickened skin, thickened toenails, no edema. MUSCULOSKELETAL: No gross abnormalities. walker NEUROLOGICAL: Alert and oriented x3, cranial nerves II through XII intact. PSYCHIATRIC: Normal mood, good eye contact. I personally spent a total of 45 minutes in the care of the patient today including preparing to see the patient, getting/reviewing separately obtained history, performing a medically appropriate exam/evaluation, counseling and educating, placing orders, documenting clinical information in the EHR, independently interpreting results, and communicating results.       Assessment & Plan:  Episodic tension-type headache, not intractable -     MR BRAIN WO CONTRAST; Future  Type 2 diabetes mellitus with diabetic neuropathy, without long-term current use of insulin  ([name]) -     Hemoglobin A1c -     Microalbumin / creatinine urine ratio -     metFORMIN  HCl; Take 1 tablet (1,000 mg total) by mouth 2 (two) times daily with a meal. TAKE 1 TABLET BY MOUTH IN THE MORNING AND 1 TABLET AT BEDTIME  Dispense: 180 tablet; Refill: 1  Coronary artery disease involving native coronary artery of native heart with unstable angina pectoris ([name])  Chronic pain disorder  Essential hypertension, benign  Hyperlipidemia with target low density lipoprotein (LDL) cholesterol less than 55 mg/dL -     Lipid panel  MPN (myeloproliferative neoplasm) ([name])  Polycythemia vera ([name])  Vitamin D  deficiency -     VITAMIN D  25 Hydroxy (Vit-D Deficiency, Fractures)  High risk medication use -     Vitamin B12  Posttraumatic stress disorder -     buPROPion  HCl ER (SR); TAKE 1 TABLET BY MOUTH IN THE MORNING AND 1 TABLET AT BEDTIME . APPOINTMENT REQUIRED FOR FUTURE REFILLS  Dispense: 180  tablet; Refill: 1 -     DULoxetine  HCl; Take 1 capsule (60 mg total) by mouth 2 (two) times daily.  Dispense: 180 capsule; Refill: 1  Abnormal smell -     MR BRAIN WO CONTRAST; Future  Other orders -     Varenicline  Tartrate (Starter); one 0.5 mg tab by mouth 1 x  daily for 3 days, then increase to one 0.5 mg tablet 2 x daily for 4 days, then increase to one 1 mg tab bid  Dispense: 1 each; Refill: 0 -     Ipratropium Bromide; Place 2 sprays into both nostrils 4 (four) times daily as needed for rhinitis.  Dispense: 15 mL; Refill: 1    Assessment and Plan Assessment & Plan Chronic pain syndrome   She experiences significant discomfort despite current pain management, with inadequate relief from medications and dissatisfaction with her provider. Her history of heart surgery and severe neck pain contribute to her overall pain experience. She is interested in non-pharmacological interventions and alternative pain management options. Discuss alternative pain management  options, including a potential referral to a different pain management center. Consider non-pharmacological interventions such as massage therapy or acupuncture. Evaluate her current pain medication regimen for potential adjustments.  Type 2 diabetes mellitus  w/CAD/PAD/neuropathy Her type 2 diabetes mellitus is generally well-controlled with metformin  1000 mg twice daily and Jardiance  10 mg daily. Occasional blood glucose monitoring shows levels typically between 113-150 mg/dL. Continue metformin  and Jardiance . Order an A1c test to assess long-term glucose control and renew the prescription for Accu-Chek strips for blood glucose monitoring.  Peripheral neuropathy  - multifactorial-PAD, DM, spine She has peripheral neuropathy with symptoms of numbness and burning in her fingers and toes, likely related to diabetes. Order a B12 level to assess for deficiency. Continue Cymbalta  for neuropathy management.  Urinary incontinence   She  experiences severe urinary incontinence with complete loss of control once the urge to urinate is felt, significantly impacting daily life. Order a urine protein test to assess for underlying issues and discuss a potential referral to a urologist for further evaluation and management.  Chronic obstructive pulmonary disease (COPD)   Her COPD requires continued use of supplemental oxygen , with difficulty managing oxygen  equipment due to mobility issues. Discuss a potential referral to a pulmonologist if symptoms worsen or for further management.  Atrial fibrillation   Her atrial fibrillation is managed with Eliquis , with no recent episodes of chest pain similar to those experienced post-catheterization. Continue Eliquis  as prescribed.  Heart failure   She has heart failure with recent valve replacement surgery and no new chest pain post-surgery.  Peripheral artery disease   She has peripheral artery disease with symptoms of leg pain and numbness, experiencing difficulty with mobility. Encourage smoking cessation to enable surgical intervention for her leg symptoms and prescribe a Chantix  starter pack to aid in smoking cessation.  Nicotine  dependence, cigarettes   She has long-standing nicotine  dependence with a recent relapse post-hospitalization and desires to quit smoking to facilitate peripheral artery disease treatment. Prescribe a Chantix  starter pack and discuss potential side effects, including nausea and vivid dreams.  Depression  w/ agoraphobia Her chronic depression is managed with Wellbutrin  100 mg twice daily and Cymbalta , maintaining a stable mood with the current regimen. Continue Wellbutrin  and Cymbalta  as prescribed and monitor mood, adjusting treatment if necessary.  Myeloproliferative disorder   Her myeloproliferative disorder is managed with hydroxyurea , taking two capsules at bedtime three times a week. Continue hydroxyurea  as prescribed. Reviewd notes/labs from heme  Chronic  kidney disease   She has chronic kidney disease with recent kidney function tests performed by her oncologist, with no new concerns raised during this visit.  Hyperlipidemia   Her hyperlipidemia is managed with atorvastatin  80 mg and Zetia  10 mg, with no new concerns raised during this visit. Continue atorvastatin  and Zetia  as prescribed. May need PSK-i  Hypertension   Her hypertension is managed with lisinopril  10 mg and metoprolol  25 mg (half tablet twice daily). Her blood pressure is slightly elevated, likely due to a missed medication dose. Continue lisinopril  and metoprolol  and recheck blood pressure during the next visit.  HA and strange smells-increasing in frequency-check MRI brain     Return in about 6 months (around 10/23/2024) for chronic follow-up.  Jenkins CHRISTELLA Carrel, MD

## 2024-04-25 ENCOUNTER — Ambulatory Visit: Attending: Cardiology | Admitting: Cardiology

## 2024-04-25 ENCOUNTER — Other Ambulatory Visit: Payer: Self-pay | Admitting: Family Medicine

## 2024-04-25 ENCOUNTER — Ambulatory Visit: Payer: Self-pay | Admitting: Family Medicine

## 2024-04-25 ENCOUNTER — Encounter: Payer: Self-pay | Admitting: Cardiology

## 2024-04-25 ENCOUNTER — Encounter: Payer: Self-pay | Admitting: Family Medicine

## 2024-04-25 VITALS — BP 124/64 | HR 55 | Ht 68.0 in | Wt 195.0 lb

## 2024-04-25 DIAGNOSIS — I2510 Atherosclerotic heart disease of native coronary artery without angina pectoris: Secondary | ICD-10-CM

## 2024-04-25 DIAGNOSIS — Z952 Presence of prosthetic heart valve: Secondary | ICD-10-CM

## 2024-04-25 DIAGNOSIS — Z79899 Other long term (current) drug therapy: Secondary | ICD-10-CM

## 2024-04-25 DIAGNOSIS — I739 Peripheral vascular disease, unspecified: Secondary | ICD-10-CM

## 2024-04-25 DIAGNOSIS — Z951 Presence of aortocoronary bypass graft: Secondary | ICD-10-CM | POA: Diagnosis not present

## 2024-04-25 DIAGNOSIS — E782 Mixed hyperlipidemia: Secondary | ICD-10-CM

## 2024-04-25 DIAGNOSIS — F17200 Nicotine dependence, unspecified, uncomplicated: Secondary | ICD-10-CM

## 2024-04-25 DIAGNOSIS — I480 Paroxysmal atrial fibrillation: Secondary | ICD-10-CM

## 2024-04-25 DIAGNOSIS — I5032 Chronic diastolic (congestive) heart failure: Secondary | ICD-10-CM

## 2024-04-25 DIAGNOSIS — I10 Essential (primary) hypertension: Secondary | ICD-10-CM

## 2024-04-25 LAB — LIPID PANEL
Cholesterol: 102 mg/dL (ref 0–200)
HDL: 36.4 mg/dL — ABNORMAL LOW (ref >39.00)
LDL Cholesterol: 35 mg/dL (ref 0–99)
NonHDL: 65.45
Total CHOL/HDL Ratio: 3
Triglycerides: 152 mg/dL — ABNORMAL HIGH (ref 0.0–149.0)
VLDL: 30.4 mg/dL (ref 0.0–40.0)

## 2024-04-25 LAB — HEMOGLOBIN A1C: Hgb A1c MFr Bld: 5.4 % (ref 4.6–6.5)

## 2024-04-25 LAB — VITAMIN B12: Vitamin B-12: 273 pg/mL (ref 211–911)

## 2024-04-25 LAB — VITAMIN D 25 HYDROXY (VIT D DEFICIENCY, FRACTURES): VITD: 25.58 ng/mL — ABNORMAL LOW (ref 30.00–100.00)

## 2024-04-25 LAB — MICROALBUMIN / CREATININE URINE RATIO
Creatinine,U: 59.8 mg/dL
Microalb Creat Ratio: 23.5 mg/g (ref 0.0–30.0)
Microalb, Ur: 1.4 mg/dL (ref 0.0–1.9)

## 2024-04-25 MED ORDER — VITAMIN D (ERGOCALCIFEROL) 1.25 MG (50000 UNIT) PO CAPS: ORAL_CAPSULE | ORAL | 1 refills | Status: AC

## 2024-04-25 NOTE — Patient Instructions (Signed)
 Medication Instructions:  HOLD Spironolactone  (Aldactone ) until told to resume by Cardiology. *If you need a refill on your cardiac medications before your next appointment, please call your pharmacy*  Lab Work: BMET today If you have labs (blood work) drawn today and your tests are completely normal, you will receive your results only by: MyChart Message (if you have MyChart) OR A paper copy in the mail If you have any lab test that is abnormal or we need to change your treatment, we will call you to review the results.  Testing/Procedures: None ordered  Follow-Up: At [facility], you and your health needs are our priority.  As part of our continuing mission to provide you with exceptional heart care, our providers are all part of one team.  This team includes your primary Cardiologist (physician) and Advanced Practice Providers or APPs (Physician Assistants and Nurse Practitioners) who all work together to provide you with the care you need, when you need it.  Your next appointment:   3 month(s)  Provider:   Katlyn West, NP          We recommend signing up for the patient portal called MyChart.  Sign up information is provided on this After Visit Summary.  MyChart is used to connect with patients for Virtual Visits (Telemedicine).  Patients are able to view lab/test results, encounter notes, upcoming appointments, etc.  Non-urgent messages can be sent to your provider as well.   To learn more about what you can do with MyChart, go to [email or web address].

## 2024-04-25 NOTE — Progress Notes (Signed)
 Labs great except  Vitamin D  low-sent in meds B12 low-is she taking it otc?  Needs to

## 2024-04-26 ENCOUNTER — Ambulatory Visit: Payer: Self-pay | Admitting: Cardiology

## 2024-04-26 ENCOUNTER — Encounter: Payer: Self-pay | Admitting: Cardiology

## 2024-04-26 LAB — BASIC METABOLIC PANEL WITH GFR
BUN/Creatinine Ratio: 41 — ABNORMAL HIGH (ref 12–28)
BUN: 32 mg/dL — ABNORMAL HIGH (ref 8–27)
CO2: 23 mmol/L (ref 20–29)
Calcium: 9.2 mg/dL (ref 8.7–10.3)
Chloride: 100 mmol/L (ref 96–106)
Creatinine, Ser: 0.79 mg/dL (ref 0.57–1.00)
Glucose: 95 mg/dL (ref 70–99)
Potassium: 5.1 mmol/L (ref 3.5–5.2)
Sodium: 139 mmol/L (ref 134–144)
eGFR: 83 mL/min/1.73 (ref >59)

## 2024-04-29 ENCOUNTER — Ambulatory Visit (HOSPITAL_BASED_OUTPATIENT_CLINIC_OR_DEPARTMENT_OTHER)
Admission: RE | Admit: 2024-04-29 | Discharge: 2024-04-29 | Disposition: A | Discharge status: Home / Self Care | Source: Ambulatory Visit | Attending: Family Medicine | Admitting: Family Medicine

## 2024-04-29 DIAGNOSIS — R431 Parosmia: Secondary | ICD-10-CM | POA: Insufficient documentation

## 2024-04-29 DIAGNOSIS — G44219 Episodic tension-type headache, not intractable: Secondary | ICD-10-CM | POA: Diagnosis present

## 2024-05-02 NOTE — Progress Notes (Signed)
Mri brain normal

## 2024-05-03 NOTE — Progress Notes (Signed)
 Called pt and notified smk

## 2024-06-21 ENCOUNTER — Encounter: Payer: Self-pay | Admitting: Hematology

## 2024-07-07 ENCOUNTER — Telehealth: Payer: Self-pay | Admitting: Family Medicine

## 2024-07-07 NOTE — Telephone Encounter (Signed)
 LVM for pt to call and reschedule their 4/27 appt since the provider is no longer available at that time

## 2024-07-24 NOTE — Progress Notes (Unsigned)
 "  Cardiology Office Note    Date:  07/24/2024  ID:  BELEM HINTZE, DOB 1960/08/05, MRN [id number] PCP:  Wendolyn Jenkins Jansky, MD  Cardiologist:  Dorn Lesches, MD  Electrophysiologist:  None   Chief Complaint: ***  History of Present Illness: .    Katelyn Cline is a 64 y.o. female with visit-pertinent history of CAD s/p CABG x 3 with LIMA to LAD, R SVG to PDA, OM1 along with aortic valve replacement with a 23 mm Inspiris valve and placement of 45 mm atrial clip on 11/03/2023 by Dr. Shyrl, paroxysmal atrial fibrillation, COPD on supplemental oxygen , tobacco use, diabetes, hypertension and hyperlipidemia.   Patient was previously referred to Dr. Wadie for atrial fibrillation.  This patient was previously in [place]  on vacation in [place] in September 2024 with increased shortness of breath.  She was evaluated [facility] and was told that she was inheart failure specifics of hospitalization unavailable.  Patient underwent DCCV with Dr. Francyne in 08/2023 with successful conversion to sinus rhythm.  She had coronary calcium  score performed on 07/19/2023 which showed a total calcium  score of 1238 with calcium  distributed in all 3 coronary arteries.  2D echo revealed normal LV systolic function, mild concentric LVH with moderate aortic stenosis AVA.  Patient underwent cardiac PET on 09/14/2023 that was high risk, she underwent diagnostic cardiac catheterization with Dr. Anner that revealed three-vessel disease.   Patient has been scheduled to be evaluated by CT CS in 10/2023 however prior to this on 10/25/2023 patient presented to the emergency department with increased chest pain Hs troponin 34>>45, EKG with sinus rhythm, ST depression in inferior lateral leads, patient was noted to have a drop in hemoglobin and she reported tarry stools for the past few months.  Patient's anemia improved during hospitalization, did not undergo endoscopy while inpatient.  On 11/03/2023 patient underwent CABG  x 3, AVR, LAA clipping and maze procedure, patient returned to the OR might have procedure with increased bleeding.  Patient was slow to wean from vent, felt to have acute on chronic diastolic CHF, AHF was consulted, started on IV diuresis with Lasix  with good response, patient was extubated on 11/07/2023.  Postop echo on 11/08/2023 indicated LVEF of 60 to 65%, no RWMA, mild LVH, grade 2 DD, RV fracture/volume overload, mildly reduced RV, moderate LAE, mild RA, trivial MR, moderate MAC, bioprosthetic aortic valve in place with mean Mildly elevated at 17 mmHg.  On 5/14 patient underwent left-sided thoracentesis which yielded 1 L of bloody fluid, patient remained hypervolemic on exam with continued diuresis post procedure.   Patient was seen in clinic on 12/07/2023 for follow-up, reported she had been doing well and remained stable from cardiac standpoint.  She was provided prescription for Keflex  prior to dental work.   Echocardiogram on 01/10/2024 indicated LVEF 70 to 75%, no RWMA, mild LVH, diastolic parameters were indeterminate, RV systolic function and size was normal, trivial mitral regurgitation with no evidence of stenosis, severe mitral annular calcification, aortic valve regurgitation was not visualized, no stenosis present, 23 mm Edwards Inspiris Resilia valve present in the aortic position.   Patient was seen in clinic on 01/24/2024 by Dr. Lesches.  She had remained stable for cardiac standpoint.  He noted that given recent coronary vascularization prolonged recovery he recommended waiting at least 6 months prior to addressing PAD.      Labwork independently reviewed:   ROS: .   *** denies chest pain, shortness of breath, lower extremity edema, fatigue,  palpitations, melena, hematuria, hemoptysis, diaphoresis, weakness, presyncope, syncope, orthopnea, and PND.  All other systems are reviewed and otherwise negative.  Studies Reviewed: SABRA    EKG:  EKG is ordered today, personally reviewed,  demonstrating ***     CV Studies: Cardiac studies reviewed are outlined and summarized above. Otherwise please see EMR for full report. Cardiac Studies & Procedures   ______________________________________________________________________________________________ CARDIAC CATHETERIZATION  CARDIAC CATHETERIZATION 10/01/2023  Conclusion Images from the original result were not included.    Mid LM to Ost LAD lesion is 45% stenosed.   1st Diag lesion is 90% stenosed.   Mid LAD-1 lesion is 70% stenosed.   Mid LAD-2 lesion is 30% stenosed. Mid LAD to Dist LAD lesion is 70% stenosed. Dist LAD lesion is 40% stenosed.   Prox Cx lesion is 80% stenosed (essentially the ostium of the AV groove portion of the Cx).   1st Mrg lesion is 90% stenosed.   .   Dist Cx lesion is 60% stenosed just prior to 2nd Mrg & 3rd Mrg lesion is 80% stenosed.   Prox RCA to Dist RCA lesion is 100% stenosed.  Retrograde flow from septal collaterals fills the PDA with retrograde flow to the occlusion site.   LV end diastolic pressure is low.  Dominance: Right  POST-OPERATIVE DIAGNOSIS: Mean AoV gradient 19 mmHg with systemic hypertension Multivessel disease: Proximal RCA on percent CTO with the distal portion filling via left-to-right collaterals mostly the PDA.  (Cannot exclude codominant RCA) Distal left main eccentric calcified 40 proximately 50% stenosis of the vessel bifurcates into the LAD and LCx LCx bifurcates quickly into the AV groove and large OM1: OM1 has proximal 90% stenosis and is a large-caliber vessel Ostial AVG-LCx has 80% stenosis and gives rise to small caliber LPL 1(OM2 on image) and LPL 3 (on image LPL1) and large caliber LPL 2 (on image OM3).  60% stenosis just proximal to LPL 1 and then the LPL 2 continuation of the circumflex has 80% stenosis. LAD gives off a large caliber D1 that has a very proximal focal 90% stenosis before it bifurcates into 2 large branches.  There is diffuse disease throughout  the vessel.  The LAD after D1 has a focal 70% stenosis.  Then in the mid vessel there is a bend the segment that starts with 30% followed by 70% and then 60%. Relatively normal right heart cath numbers with mean PAP of 18 mmHg and PCWP of 13 mmHg. Normal Cardiac Output and Index by Fick: 6.37-3.08.   RECOMMENDATIONS Discharge to home after PACU; Recommend outpatient CVTS consultation => patient has moderate MR on Echo and Multivessel Disease, best served with CABG/AVR; multiple lesions and likely incomplete revascularization if multivessel PCI is the option chosen.  Findings Coronary Findings Diagnostic  Dominance: Right  Left Main Vessel was injected. Vessel is large. There is moderate focal disease in the vessel. Mid LM to Ost LAD lesion is 45% stenosed.  Left Anterior Descending Vessel is large. Mid LAD-1 lesion is 70% stenosed. Mid LAD-2 lesion is 30% stenosed. Mid LAD to Dist LAD lesion is 70% stenosed. Dist LAD lesion is 40% stenosed.  First Diagonal Branch There is moderate disease in the vessel. 1st Diag lesion is 90% stenosed.  Lateral First Diagonal Branch Vessel is small in size.  Third Septal Branch Vessel is small in size.  Left Circumflex Vessel is large. Course is as the AV groove LCx Prox Cx lesion is 80% stenosed. Dist Cx lesion is 60% stenosed.  First  Obtuse Marginal Branch Vessel is large in size. 1st Mrg lesion is 90% stenosed.  Second Obtuse Marginal Branch OM2 (2nd Mrg vs. LPL 1)  Third Obtuse Marginal Branch Vessel is large in size. 3rd Mrg lesion is 80% stenosed.  Right Coronary Artery Vessel was injected. Vessel is normal in caliber. There is severe diffuse disease throughout the vessel. There is severe focal disease in the vessel. Prox RCA to Dist RCA lesion is 100% stenosed.  Right Ventricular Branch Vessel is small in size.  Right Posterior Descending Artery There is mild disease in the vessel. Collaterals RPDA filled by  collaterals from 2nd Sept.  Collaterals RPDA filled by collaterals from 3rd Sept.  First Right Posterolateral Branch Vessel is small in size.  Intervention  No interventions have been documented.   STRESS TESTS  NM PET CT CARDIAC PERFUSION MULTI W/ABSOLUTE BLOODFLOW 09/14/2023  Narrative   The study is high risk.   The study is high risk due to severe reduction in myocardial blood flow in all coronary territories, suggesting multivessel disease.  Findings are also consistent with infarction with peri-infarct ischemia probably in the left circumflex territory.There is substantial interference with evaluation of the inferolateral wall from tracer uptake in the stomach and spleen.   Myocardial blood flow was computed to be 0.79ml/g/min at rest and 1.32ml/g/min at stress. Global myocardial blood flow reserve was 1.44 and was highly abnormal.   LV perfusion is abnormal. Defect 1: There is a small defect with mild reduction in uptake present in the mid to basal inferior and inferolateral location(s) that is partially reversible. There is normal wall motion in the defect area. Consistent with infarction and peri-infarct ischemia. The defect is consistent with abnormal perfusion in the LCx or RCA territories.   Rest left ventricular function is abnormal. Rest global function is mildly reduced. Rest EF: 48%. Stress left ventricular function is normal. Stress EF: 57%. End diastolic cavity size is mildly enlarged. End systolic cavity size is mildly enlarged.   Coronary calcium  was present on the attenuation correction CT images. Severe coronary calcifications were present. Coronary calcifications were present in the left anterior descending artery, left circumflex artery and right coronary artery distribution(s).   There is heavy calcification in the aortic valve consistent with known aortic stenosis.   Electronically signed by Jerel Balding, MD  CLINICAL DATA:  This over-read does not include  interpretation of cardiac or coronary anatomy or pathology. The Cardiac PET CT interpretation by the cardiologist is attached.  COMPARISON:  Coronary calcium  scoring, 07/25/2023, CT chest, 02/08/2023  FINDINGS: Cardiovascular: Aortic atherosclerosis. Aortic valve calcifications. Cardiomegaly. Three-vessel coronary artery calcifications. Enlargement of the main pulmonary artery measuring up to 3.8 cm in caliber. No pericardial effusion.  Limited Mediastinum/Nodes: No enlarged mediastinal, hilar, or axillary lymph nodes. Trachea and esophagus demonstrate no significant findings.  Limited Lungs/Pleura: Evidence of prior right upper lobe wedge resection. Mild diffuse bilateral bronchial wall thickening and mosaic attenuation of the airspaces. Unchanged benign nodules, largest in the dependent left lower lobe measuring 0.9 cm (series 4, image 58). No pleural effusion or pneumothorax.  Upper Abdomen: No acute abnormality.  Musculoskeletal: No chest wall abnormality. No acute osseous findings.  IMPRESSION: 1. Mild diffuse bilateral bronchial wall thickening and mosaic attenuation of the airspaces, consistent with nonspecific small airways disease. 2. Evidence of prior right upper lobe wedge resection. 3. Unchanged benign nodules, requiring no specific further follow-up or characterization. 4. Cardiomegaly and coronary artery disease. 5. Enlargement of the main pulmonary artery, as can  be seen in pulmonary hypertension. 6. Aortic valve calcifications. Correlate for echocardiographic evidence of aortic valve dysfunction.  Aortic Atherosclerosis (ICD10-I70.0).   Electronically Signed By: Marolyn JONETTA Jaksch M.D. On: 09/14/2023 09:41   ECHOCARDIOGRAM  ECHOCARDIOGRAM COMPLETE 01/10/2024  Narrative ECHOCARDIOGRAM REPORT    Patient Name:   Katelyn Cline Date of Exam: 01/10/2024 Medical Rec #:  [id number]        Height:       68.0 in Accession #:    [id number]       Weight:        187.0 lb Date of Birth:  1961-05-02        BSA:          1.986 m Patient Age:    63 years         BP:           147/68 mmHg Patient Gender: F                HR:           66 bpm. Exam Location:  Outpatient  Procedure: 2D Echo, Cardiac Doppler, Color Doppler, Strain Analysis and 3D Echo (Both Spectral and Color Flow Doppler were utilized during procedure).  Indications:    S/P Aortic Valve replacement Z95.2  History:        Patient has prior history of Echocardiogram examinations, most recent 11/08/2023. Angina and CAD, Prior CABG, PAD, Arrythmias:Atrial Fibrillation and PVC; Risk Factors:Hypertension, Diabetes, Dyslipidemia and Current Smoker. Dr. Shyrl, 11/03/23: CABG X 3. LIMA LAD, RSVG PDA, OM1 Aortic valve replacement with a 23mm Inspiris valve Placement of 45mm Atriclip.  Aortic Valve: 23 mm Edwards Inspiris Resilia valve is present in the aortic position. Procedure Date: 11/03/23.  Sonographer:    Thea Norlander RCS Referring Phys: ANN MARIE KULIK  IMPRESSIONS   1. Left ventricular ejection fraction, by estimation, is 70 to 75%. The left ventricle has hyperdynamic function. The left ventricle has no regional wall motion abnormalities. There is mild left ventricular hypertrophy. Left ventricular diastolic parameters are indeterminate. 2. Right ventricular systolic function is normal. The right ventricular size is normal. Tricuspid regurgitation signal is inadequate for assessing PA pressure. 3. The mitral valve is degenerative. Trivial mitral valve regurgitation. No evidence of mitral stenosis. Severe mitral annular calcification. 4. The aortic valve has been repaired/replaced. Aortic valve regurgitation is not visualized. No aortic stenosis is present. There is a 23 mm Edwards Inspiris Resilia valve present in the aortic position. Procedure Date: 11/03/23. Echo findings are consistent with normal structure and function of the aortic valve prosthesis. Aortic valve area, by VTI  measures 2.90 cm. Aortic valve mean gradient measures 12.7 mmHg. Aortic valve Vmax measures 2.47 m/s. 5. The inferior vena cava is normal in size with greater than 50% respiratory variability, suggesting right atrial pressure of 3 mmHg.  FINDINGS Left Ventricle: Left ventricular ejection fraction, by estimation, is 70 to 75%. The left ventricle has hyperdynamic function. The left ventricle has no regional wall motion abnormalities. The left ventricular internal cavity size was normal in size. There is mild left ventricular hypertrophy. Left ventricular diastolic parameters are indeterminate.  Right Ventricle: The right ventricular size is normal. No increase in right ventricular wall thickness. Right ventricular systolic function is normal. Tricuspid regurgitation signal is inadequate for assessing PA pressure.  Left Atrium: Left atrial size was normal in size.  Right Atrium: Right atrial size was normal in size.  Pericardium: There is no evidence of pericardial effusion.  Mitral  Valve: The mitral valve is degenerative in appearance. Severe mitral annular calcification. Trivial mitral valve regurgitation. No evidence of mitral valve stenosis. MV peak gradient, 5.6 mmHg. The mean mitral valve gradient is 2.0 mmHg with average heart rate of 65 bpm.  Tricuspid Valve: The tricuspid valve is normal in structure. Tricuspid valve regurgitation is trivial. No evidence of tricuspid stenosis.  Aortic Valve: The aortic valve has been repaired/replaced. Aortic valve regurgitation is not visualized. No aortic stenosis is present. Aortic valve mean gradient measures 12.7 mmHg. Aortic valve peak gradient measures 24.3 mmHg. Aortic valve area, by VTI measures 2.90 cm. There is a 23 mm Edwards Inspiris Resilia valve present in the aortic position. Procedure Date: 11/03/23. Echo findings are consistent with normal structure and function of the aortic valve prosthesis.  Pulmonic Valve: The pulmonic valve was  normal in structure. Pulmonic valve regurgitation is trivial. No evidence of pulmonic stenosis.  Aorta: The aortic root is normal in size and structure.  Venous: The inferior vena cava is normal in size with greater than 50% respiratory variability, suggesting right atrial pressure of 3 mmHg.  IAS/Shunts: No atrial level shunt detected by color flow Doppler.  Additional Comments: 3D was performed not requiring image post processing on an independent workstation and was indeterminate.   LEFT VENTRICLE PLAX 2D LVIDd:         4.80 cm   Diastology LVIDs:         3.20 cm   LV e' medial:    5.98 cm/s LV PW:         1.10 cm   LV E/e' medial:  14.1 LV IVS:        1.00 cm   LV e' lateral:   6.53 cm/s LVOT diam:     2.10 cm   LV E/e' lateral: 12.9 LV SV:         147 LV SV Index:   74 LVOT Area:     3.46 cm  3D Volume EF: 3D EF:        65 % LV EDV:       216 ml LV ESV:       75 ml LV SV:        141 ml  RIGHT VENTRICLE            IVC RV S prime:     7.78 cm/s  IVC diam: 1.50 cm TAPSE (M-mode): 1.4 cm  LEFT ATRIUM             Index        RIGHT ATRIUM           Index LA diam:        4.30 cm 2.16 cm/m   RA Area:     16.00 cm LA Vol (A2C):   71.2 ml 35.82 ml/m  RA Volume:   42.00 ml  21.15 ml/m LA Vol (A4C):   57.8 ml 29.10 ml/m LA Biplane Vol: 66.7 ml 33.58 ml/m AORTIC VALVE AV Area (Vmax):    2.81 cm AV Area (Vmean):   2.84 cm AV Area (VTI):     2.90 cm AV Vmax:           246.67 cm/s AV Vmean:          159.667 cm/s AV VTI:            0.505 m AV Peak Grad:      24.3 mmHg AV Mean Grad:      12.7 mmHg LVOT  Vmax:         200.00 cm/s LVOT Vmean:        131.000 cm/s LVOT VTI:          0.423 m LVOT/AV VTI ratio: 0.84  AORTA Ao Root diam: 2.60 cm Ao Asc diam:  3.80 cm  MITRAL VALVE MV Area (PHT): 2.37 cm     SHUNTS MV Area VTI:   4.12 cm     Systemic VTI:  0.42 m MV Peak grad:  5.6 mmHg     Systemic Diam: 2.10 cm MV Mean grad:  2.0 mmHg MV Vmax:       1.18 m/s MV  Vmean:      70.7 cm/s MV Decel Time: 320 msec MV E velocity: 84.40 cm/s MV A velocity: 113.00 cm/s MV E/A ratio:  0.75  Soyla Merck MD Electronically signed by Soyla Merck MD Signature Date/Time: 01/11/2024/12:02:22 PM    Final   TEE  ECHO INTRAOPERATIVE TEE 11/03/2023  Narrative *INTRAOPERATIVE TRANSESOPHAGEAL REPORT *    Patient Name:   NIELS DELENA CONE Date of Exam: 11/03/2023 Medical Rec #:  [id number]        Height:       68.5 in Accession #:    [id number]       Weight:       221.0 lb Date of Birth:  03-11-1961        BSA:          2.14 m Patient Age:    63 years         BP:           134/56 mmHg Patient Gender: F                HR:           66 bpm. Exam Location:  Anesthesiology  Transesophogeal exam was perform intraoperatively during surgical procedure. Patient was closely monitored under general anesthesia during the entirety of examination.  Indications:     CAD, AS Performing Phys: [id number] HARRELL O LIGHTFOOT  Complications: No known complications during this procedure. POST-OP IMPRESSIONS _ Left Ventricle: has normal systolic function, with an ejection fraction of 55%. The cavity size was normal. The wall motion is normal. _ Right Ventricle: The right ventricle appears unchanged from pre-bypass. _ Aorta: The aorta appears unchanged from pre-bypass. _ Left Atrial Appendage: The left atrial appendage appears unchanged from pre-bypass. _ Aortic Valve: No stenosis present. A bioprosthetic valve was placed, leaflets thin and leaflets are freely mobile Manufactured by; Inspiris Size; 23mm. There is no regurgitation. The gradient recorded across the prosthetic valve is within the expected range. No perivalvular leak noted. _ Mitral Valve: The mitral valve appears unchanged from pre-bypass. There is mild regurgitation. _ Tricuspid Valve: There is trivial regurgitation. _ Pulmonic Valve: The pulmonic valve appears unchanged from pre-bypass. _ Interatrial  Septum: The interatrial septum appears unchanged from pre-bypass. _ Pericardium: The pericardium appears unchanged from pre-bypass. _ Comments: Post-bypass images reviewed with surgeon.  PRE-OP FINDINGS Left Ventricle: The left ventricle has normal systolic function, with an ejection fraction of 55-60%. The cavity size was normal. There is mild concentric left ventricular hypertrophy.   Right Ventricle: The right ventricle has normal systolic function. The cavity was normal. There is no increase in right ventricular wall thickness.  Left Atrium: Left atrial size was dilated. No left atrial/left atrial appendage thrombus was detected. Left atrial appendage velocity is normal at greater than 40 cm/s.  Right Atrium: Right atrial  size was normal in size.  Interatrial Septum: No atrial level shunt detected by color flow Doppler.  Pericardium: There is no evidence of pericardial effusion.  Mitral Valve: Degenerative. Mitral valve regurgitation is trivial by color flow Doppler. There is No evidence of mitral stenosis. There is severe calcifcation present on the mitral annulus.  Tricuspid Valve: The tricuspid valve was normal in structure. Tricuspid valve regurgitation is trivial by color flow Doppler.  Aortic Valve: The aortic valve is tricuspid Aortic valve regurgitation was not visualized by color flow Doppler. There is moderate stenosis of the aortic valve. There is moderate calcification present. The non-coronary cusp appears immobile.  Pulmonic Valve: The pulmonic valve was normal in structure. Pulmonic valve regurgitation is trivial by color flow Doppler.   Aorta: The aortic root is normal in size and structure. There is evidence of plaque in the ascending aorta, aortic arch and descending aorta.  Pulmonary Artery: The pulmonary artery is of normal size.  +-------------+---------++ AORTIC VALVE           +-------------+---------++ AV Mean Grad:21.0  mmHg +-------------+---------++   Garnette Skillern MD Electronically signed by Garnette Skillern MD Signature Date/Time: 11/03/2023/6:12:29 PM    Final  MONITORS  LONG TERM MONITOR (3-14 DAYS) 07/09/2023  Narrative Patch Wear Time:  13 days and 23 hours (2024-12-15T19:07:31-0500 to 2024-12-29T19:07:23-0500)  Atrial Fibrillation occurred continuously (100% burden), ranging from 46-165 bpm (avg of 92 bpm). Isolated VEs were occasional (1.2%, [id number]), VE Couplets were rare (<1.0%, 1320), and no VE Triplets were present. Ventricular Bigeminy was present.  Afib with 100% burden (rate 46-165)   CT SCANS  CT CARDIAC SCORING (SELF PAY ONLY) 07/19/2023  Addendum 07/25/2023 12:03 AM ADDENDUM REPORT: 07/25/2023 00:01  EXAM: OVER-READ INTERPRETATION  CT CHEST  The following report is an over-read performed by radiologist Dr. Andrea Gasman of [facility] on 07/24/2023. This over-read does not include interpretation of cardiac or coronary anatomy or pathology. The coronary calcium  score interpretation by the cardiologist is attached.  COMPARISON:  Chest CT 02/08/2023  FINDINGS: Vascular: Aortic atherosclerosis. The included aorta is normal in caliber.  Mediastinum/nodes: No adenopathy or mass. Unremarkable esophagus.  Lungs: No focal airspace disease. Unchanged 9 mm pleural based left lower lobe nodule series 2, image 52. Unchanged 4 mm subpleural left upper lobe nodule series 2, image 8. These nodules demonstrate long-term stability and need no further imaging follow-up. Postsurgical change in the right upper lobe. No pleural fluid. The included airways are patent.  Upper abdomen: No acute or unexpected findings.  Musculoskeletal: There are no acute or suspicious osseous abnormalities.  IMPRESSION: 1.  Aortic Atherosclerosis (ICD10-I70.0). 2. Pulmonary nodules that have been previously evaluated, and need no further imaging follow-up.   Electronically Signed By:  Andrea Gasman M.D. On: 07/25/2023 00:01  Narrative CLINICAL DATA:  Cardiovascular Disease Risk stratification  EXAM: Coronary Calcium  Score  TECHNIQUE: A gated, non-contrast computed tomography scan of the heart was performed using 3 mm slice thickness. Axial images were analyzed on a dedicated workstation. Calcium  scoring of the coronary arteries was performed using the Agatston method.  FINDINGS: Coronary arteries: Normal origins.  Coronary Calcium  Score:  Left main: 98.2  Left anterior descending artery: 276  Left circumflex artery: 597  Right coronary artery: 267  Total: 1238  Percentile: 99th  Pericardium: Normal.  Aorta: Normal caliber.  Aortic atherosclerosis.  Aortic leaflet and mitral annular calcification.  Dilated main pulmonary artery (38 mm, non-contrast), suggestive of pulmonary hypertension.  Non-cardiac: See separate report from  [facility].  IMPRESSION: 1. Coronary calcium  score of 1238. This was 99th percentile for age-, race-, and sex-matched controls (MESA). 2. Aortic leaflet and mitral annular calcification. 3. Aortic atherosclerosis. 4. Dilated main pulmonary artery (38 mm, non-contrast), suggestive of pulmonary hypertension.  RECOMMENDATIONS: Coronary artery calcium  (CAC) score is a strong predictor of incident coronary heart disease (CHD) and provides predictive information beyond traditional risk factors. CAC scoring is reasonable to use in the decision to withhold, postpone, or initiate statin therapy in intermediate-risk or selected borderline-risk asymptomatic adults (age 39-75 years and LDL-C >=70 to <190 mg/dL) who do not have diabetes or established atherosclerotic cardiovascular disease (ASCVD).* In intermediate-risk (10-year ASCVD risk >=7.5% to <20%) adults or selected borderline-risk (10-year ASCVD risk >=5% to <7.5%) adults in whom a CAC score is measured for the purpose of making a treatment decision the  following recommendations have been made:  If CAC=0, it is reasonable to withhold statin therapy and reassess in 5 to 10 years, as long as higher risk conditions are absent (diabetes mellitus, family history of premature CHD in first degree relatives (males <55 years; females <65 years), cigarette smoking, or LDL >=190 mg/dL).  If CAC is 1 to 99, it is reasonable to initiate statin therapy for patients >=2 years of age.  If CAC is >=100 or >=75th percentile, it is reasonable to initiate statin therapy at any age.  Cardiology referral should be considered for patients with CAC scores >=400 or >=75th percentile.  *2018 AHA/ACC/AACVPR/AAPA/ABC/ACPM/ADA/AGS/APhA/ASPC/NLA/PCNA Guideline on the Management of Blood Cholesterol: A Report of the American College of Cardiology/American Heart Association Task Force on Clinical Practice Guidelines. J Am Coll Cardiol. 2019;73(24):3168-3209.  Vinie Maxcy, MD  Electronically Signed: By: Vinie JAYSON Maxcy M.D. On: 07/19/2023 15:24     ______________________________________________________________________________________________       Current Reported Medications:.    Active Medications[1]  Physical Exam:    VS:  There were no vitals taken for this visit.   Wt Readings from Last 3 Encounters:  04/25/24 195 lb (88.5 kg)  04/24/24 195 lb (88.5 kg)  03/22/24 194 lb 11.2 oz (88.3 kg)    GEN: Well nourished, well developed in no acute distress NECK: No JVD; No carotid bruits CARDIAC: ***RRR, no murmurs, rubs, gallops RESPIRATORY:  Clear to auscultation without rales, wheezing or rhonchi  ABDOMEN: Soft, non-tender, non-distended EXTREMITIES:  No edema; No acute deformity     Asessement and Plan:.     ***     Disposition: F/u with ***  Signed, Demaya Hardge D Eleuterio Dollar, NP      [1]  No outpatient medications have been marked as taking for the 07/27/24 encounter (Appointment) with Ivon Roedel D, NP.   "

## 2024-07-25 ENCOUNTER — Encounter: Payer: Self-pay | Admitting: Hematology

## 2024-07-25 ENCOUNTER — Other Ambulatory Visit: Payer: Self-pay

## 2024-07-25 DIAGNOSIS — D45 Polycythemia vera: Secondary | ICD-10-CM

## 2024-07-26 ENCOUNTER — Inpatient Hospital Stay

## 2024-07-26 ENCOUNTER — Telehealth: Payer: Self-pay | Admitting: Hematology

## 2024-07-26 ENCOUNTER — Inpatient Hospital Stay: Admitting: Hematology

## 2024-07-26 ENCOUNTER — Encounter: Payer: Self-pay | Admitting: Hematology

## 2024-07-26 NOTE — Telephone Encounter (Signed)
 Called the patient to reschedule her appts. She is aware of new date and time.

## 2024-07-27 ENCOUNTER — Ambulatory Visit: Admitting: Cardiology

## 2024-08-08 ENCOUNTER — Inpatient Hospital Stay: Admitting: Hematology

## 2024-08-08 ENCOUNTER — Inpatient Hospital Stay

## 2024-08-23 ENCOUNTER — Ambulatory Visit: Admitting: Cardiovascular Disease

## 2024-10-23 ENCOUNTER — Ambulatory Visit: Admitting: Family Medicine
